# Patient Record
Sex: Male | Born: 1950 | Race: Black or African American | Hispanic: No | Smoking: Never smoker
Health system: Southern US, Community
[De-identification: ages and names within clinical notes are randomized; demographics above are authoritative.]

## PROBLEM LIST (undated history)

## (undated) DIAGNOSIS — R9431 Abnormal electrocardiogram [ECG] [EKG]: Secondary | ICD-10-CM

## (undated) DIAGNOSIS — R55 Syncope and collapse: Secondary | ICD-10-CM

## (undated) DIAGNOSIS — R0600 Dyspnea, unspecified: Secondary | ICD-10-CM

## (undated) DIAGNOSIS — M1990 Unspecified osteoarthritis, unspecified site: Secondary | ICD-10-CM

## (undated) DIAGNOSIS — Z9889 Other specified postprocedural states: Secondary | ICD-10-CM

## (undated) DIAGNOSIS — F1011 Alcohol abuse, in remission: Secondary | ICD-10-CM

## (undated) DIAGNOSIS — I10 Essential (primary) hypertension: Secondary | ICD-10-CM

## (undated) DIAGNOSIS — J449 Chronic obstructive pulmonary disease, unspecified: Secondary | ICD-10-CM

## (undated) DIAGNOSIS — F419 Anxiety disorder, unspecified: Secondary | ICD-10-CM

## (undated) DIAGNOSIS — R058 Other specified cough: Secondary | ICD-10-CM

## (undated) DIAGNOSIS — F1290 Cannabis use, unspecified, uncomplicated: Secondary | ICD-10-CM

## (undated) DIAGNOSIS — J189 Pneumonia, unspecified organism: Secondary | ICD-10-CM

## (undated) DIAGNOSIS — R05 Cough: Secondary | ICD-10-CM

## (undated) DIAGNOSIS — J45909 Unspecified asthma, uncomplicated: Secondary | ICD-10-CM

## (undated) DIAGNOSIS — R001 Bradycardia, unspecified: Secondary | ICD-10-CM

## (undated) HISTORY — PX: PEG TUBE REMOVAL: SHX2187

---

## 1970-09-14 HISTORY — PX: CIRCUMCISION: SUR203

## 2000-05-26 ENCOUNTER — Ambulatory Visit (HOSPITAL_COMMUNITY): Admission: RE | Admit: 2000-05-26 | Discharge: 2000-05-26 | Payer: Self-pay | Admitting: Gastroenterology

## 2000-05-26 ENCOUNTER — Encounter (INDEPENDENT_AMBULATORY_CARE_PROVIDER_SITE_OTHER): Payer: Self-pay | Admitting: *Deleted

## 2000-07-05 ENCOUNTER — Encounter: Payer: Self-pay | Admitting: Internal Medicine

## 2000-07-05 ENCOUNTER — Encounter: Admission: RE | Admit: 2000-07-05 | Discharge: 2000-07-05 | Payer: Self-pay | Admitting: Internal Medicine

## 2000-08-19 ENCOUNTER — Emergency Department (HOSPITAL_COMMUNITY): Admission: EM | Admit: 2000-08-19 | Discharge: 2000-08-19 | Payer: Self-pay | Admitting: Emergency Medicine

## 2000-09-02 ENCOUNTER — Emergency Department (HOSPITAL_COMMUNITY): Admission: EM | Admit: 2000-09-02 | Discharge: 2000-09-02 | Payer: Self-pay | Admitting: Emergency Medicine

## 2000-11-23 ENCOUNTER — Inpatient Hospital Stay (HOSPITAL_COMMUNITY): Admission: EM | Admit: 2000-11-23 | Discharge: 2000-11-27 | Payer: Self-pay | Admitting: *Deleted

## 2000-11-29 ENCOUNTER — Other Ambulatory Visit (HOSPITAL_COMMUNITY): Admission: RE | Admit: 2000-11-29 | Discharge: 2000-12-16 | Payer: Self-pay | Admitting: Psychiatry

## 2001-02-08 ENCOUNTER — Encounter: Admission: RE | Admit: 2001-02-08 | Discharge: 2001-02-08 | Payer: Self-pay | Admitting: Internal Medicine

## 2001-02-08 ENCOUNTER — Encounter: Payer: Self-pay | Admitting: Internal Medicine

## 2002-08-10 ENCOUNTER — Emergency Department (HOSPITAL_COMMUNITY): Admission: EM | Admit: 2002-08-10 | Discharge: 2002-08-10 | Payer: Self-pay | Admitting: Emergency Medicine

## 2002-08-10 ENCOUNTER — Encounter: Payer: Self-pay | Admitting: Emergency Medicine

## 2003-03-14 ENCOUNTER — Emergency Department (HOSPITAL_COMMUNITY): Admission: EM | Admit: 2003-03-14 | Discharge: 2003-03-14 | Payer: Self-pay | Admitting: Emergency Medicine

## 2003-03-15 ENCOUNTER — Emergency Department (HOSPITAL_COMMUNITY): Admission: EM | Admit: 2003-03-15 | Discharge: 2003-03-15 | Payer: Self-pay | Admitting: Emergency Medicine

## 2004-01-31 ENCOUNTER — Ambulatory Visit (HOSPITAL_COMMUNITY): Admission: RE | Admit: 2004-01-31 | Discharge: 2004-01-31 | Payer: Self-pay | Admitting: *Deleted

## 2004-02-13 ENCOUNTER — Ambulatory Visit (HOSPITAL_COMMUNITY): Admission: RE | Admit: 2004-02-13 | Discharge: 2004-02-13 | Payer: Self-pay | Admitting: *Deleted

## 2004-02-13 ENCOUNTER — Ambulatory Visit (HOSPITAL_BASED_OUTPATIENT_CLINIC_OR_DEPARTMENT_OTHER): Admission: RE | Admit: 2004-02-13 | Discharge: 2004-02-13 | Payer: Self-pay | Admitting: *Deleted

## 2004-02-13 HISTORY — PX: LACERATION REPAIR: SHX5168

## 2005-11-14 IMAGING — MR MR [PERSON_NAME] UP JT W/CM*R*
8 series · 16 of 16 positions shown · IV contrast (Yes   OMNISCAN)
Comparison: none

CLINICAL DATA: Wrist pain for approximately 11 months.  Question scapholunate ligament tear.  
MRI ARTHROGRAM RIGHT WRIST WITH CONTRAST
Paper copies of right wrist radiographs done at [REDACTED] on 01/08/04 are correlated.   Multiplanar imaging was performed according to the MR arthrogram protocol.  
  The MR confirms the presence of contrasted fluid within the distal radioulnar joint.  As correlated with the fluoroscopic images obtained during the injection, this is probably via a small central degenerative tear of the triangular fibrocartilage.  The ulnar variance is neutral, and no cystic change or edema is present in the distal ulna or adjacent lunate.  There is mild subchondral cystic change in the volar aspect of the triquetrum adjacent to its articulation with the pisiform.  There is quite a bit of capsular extravasation from the ulnar aspect of the wrist.  This is typically incidental, but can be seen with injury to the ulnar collateral ligament.  The extensor carpi ulnaris tendon appears normally positioned, and there is no contrasted fluid within its sheath.  
No contrasted fluid is present within the mid carpal joint.  The scapholunate and lunotriquetral ligaments appear intact.  There is no evidence of carpal bone fracture.  A small amount of fluid within the flexor tendon sheaths contains Gadolinium and is likely related to the capsular extravasation.  Minimal degenerative change is noted at the base of the first metacarpal.
IMPRESSION
1.  MRI of the right wrist demonstrates contrasted fluid in the distal radioulnar joint, likely due to a small degenerative central tear of the triangular fibrocartilage.  Alternatively, this could be due to injury of the ulnar aspect of the TFCC, and there is some capsular extravasation in the region of the ulnar collateral ligament.
2.  Intact scapholunate and lunotriquetral ligaments.  No suspicious carpal bone signal abnormalities.  No evidence of ulnolunate abutment syndrome.

[Series 1: 3 plane loc · axial · 4.0mm · 0.94mm/px · 1 of 27 slices shown]
[im 1/27]
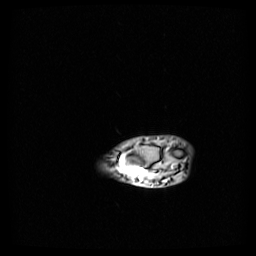

[Series 2: T1 fat-sat · oblique · 3.0mm · 0.47mm/px · 2 of 20 slices shown (1 of 3)]
[im 1/20]
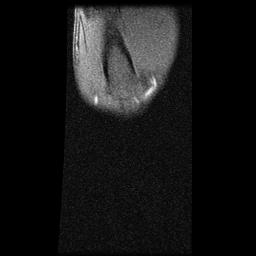
[im 20/20]
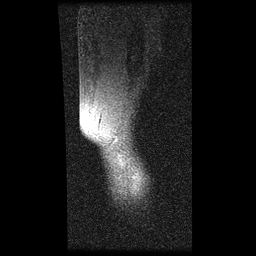

[Series 3: T1 fat-sat · coronal · 2.0mm · 0.47mm/px · 2 of 20 slices shown (2 of 3)]
[im 1/20]
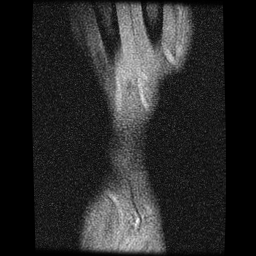
[im 20/20]
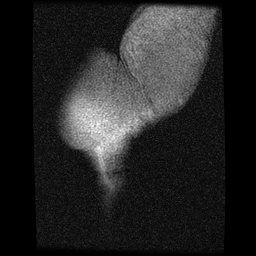

[Series 4: PD · coronal · 2.0mm · 0.47mm/px · 2 of 20 slices shown]
[im 1/20]
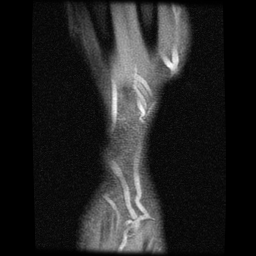
[im 20/20]
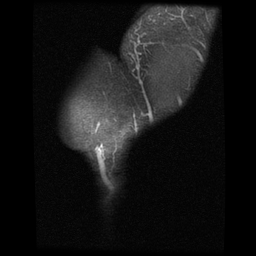

[Series 5: T2 · coronal · 2.0mm · 0.23mm/px · 2 of 20 slices shown (1 of 2)]
[im 1/20]
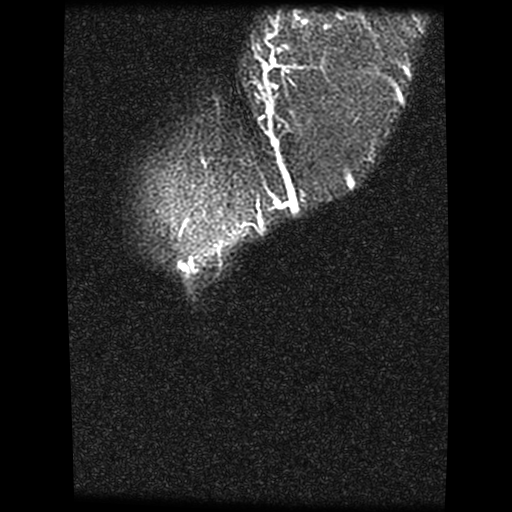
[im 20/20]
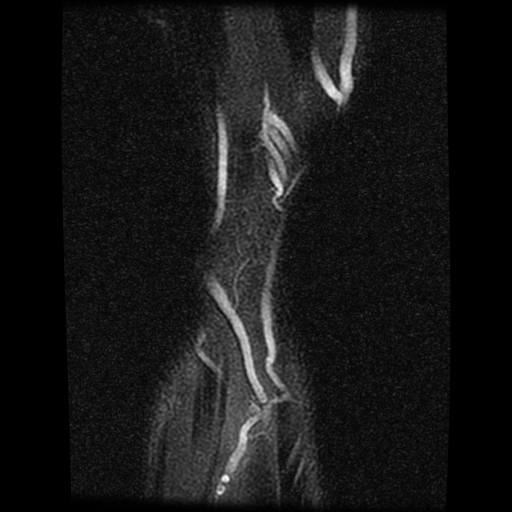

[Series 6: T1 fat-sat · oblique · 3.0mm · 0.39mm/px · 2 of 20 slices shown (3 of 3)]
[im 1/20]
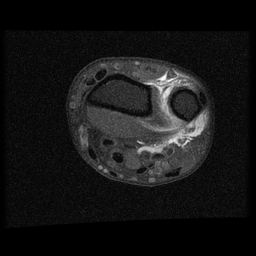
[im 20/20]
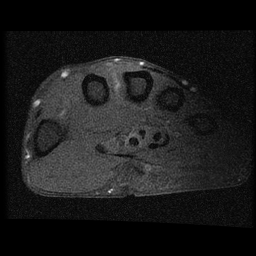

[Series 7: PD fat-sat · oblique · 3.0mm · 0.39mm/px · 3 of 40 slices shown]
[im 1/40]
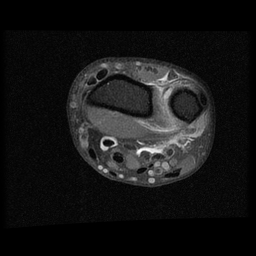
[im 20/40]
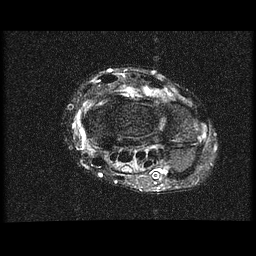
[im 40/40]
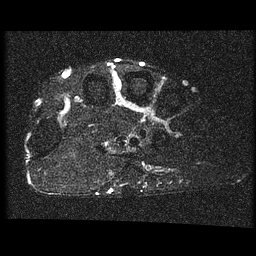

[Series 8: T2 · coronal · 2.0mm · 0.23mm/px · 2 of 19 slices shown (2 of 2)]
[im 1/19]
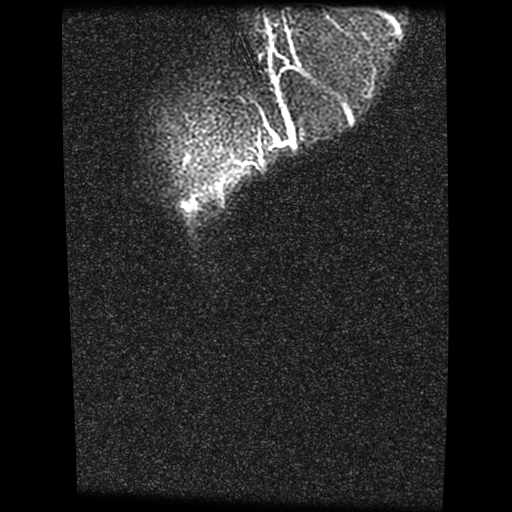
[im 19/19]
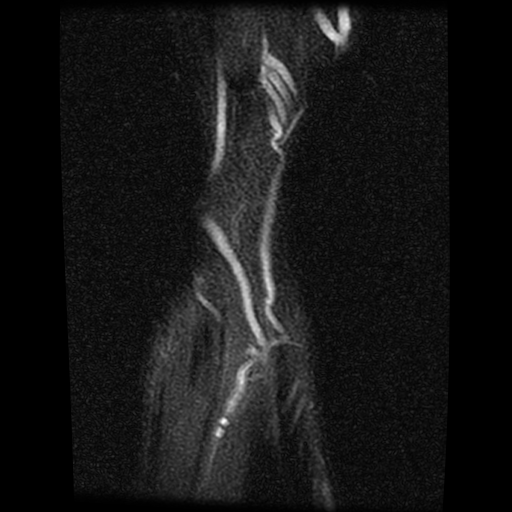

[16 of 16 positions shown; findings below may reference images not displayed]

## 2005-11-14 IMAGING — RF DG FLUORO GUIDE NDL PLC/BX
5 series · 5 of 5 positions shown · IV contrast (omniscan)
Comparison: none

CLINICAL DATA: Right wrist pain. 
RIGHT WRIST CONTRAST INJECTION UNDER FLUOROSCOPIC GUIDANCE 
Signed informed consent was obtained from the patient for the wrist arthrogram procedure. The dorsal aspect of the wrist was sterilely prepped and draped. Under fluoroscopic guidance, a 25-gauge needle was advanced into the joint space at the level of the mid scaphoid waist.   60 percent Hypaque contrast containing 1 to 1,000 dilution of Omniscan was injected into the radiocarpal joint space successfully.  Approximately 4 cc was injected, and contrast is seen filling the distal radial ulnar joint, consistent with a tear in the triangular fibrocartilage.  
The needle was subsequently removed and a sterile bandage was applied to the skin puncture site.  The patient tolerated the procedure without difficulty and there was no evidence of immediate complication.
MRI arthrogram results will be reported separately.
IMPRESSION
Successful contrast injection into the radiocarpal joint space of the right wrist under fluoroscopic guidance.  MRI arthrogram results will be reported separately.

[Series 1: run · 1 of 1 slices shown (1 of 5)]
[im 1/1]
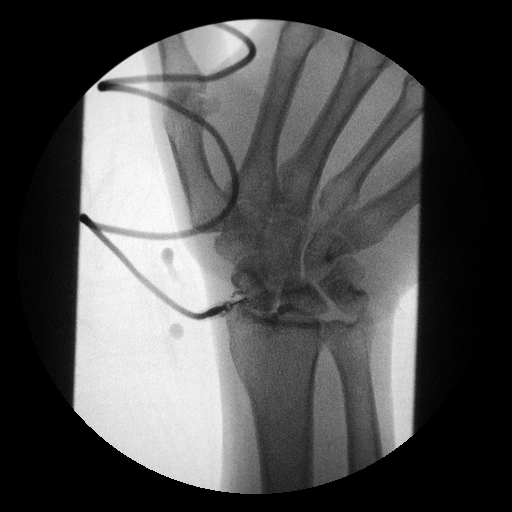

[Series 2: run · 1 of 1 slices shown (2 of 5)]
[im 1/1]
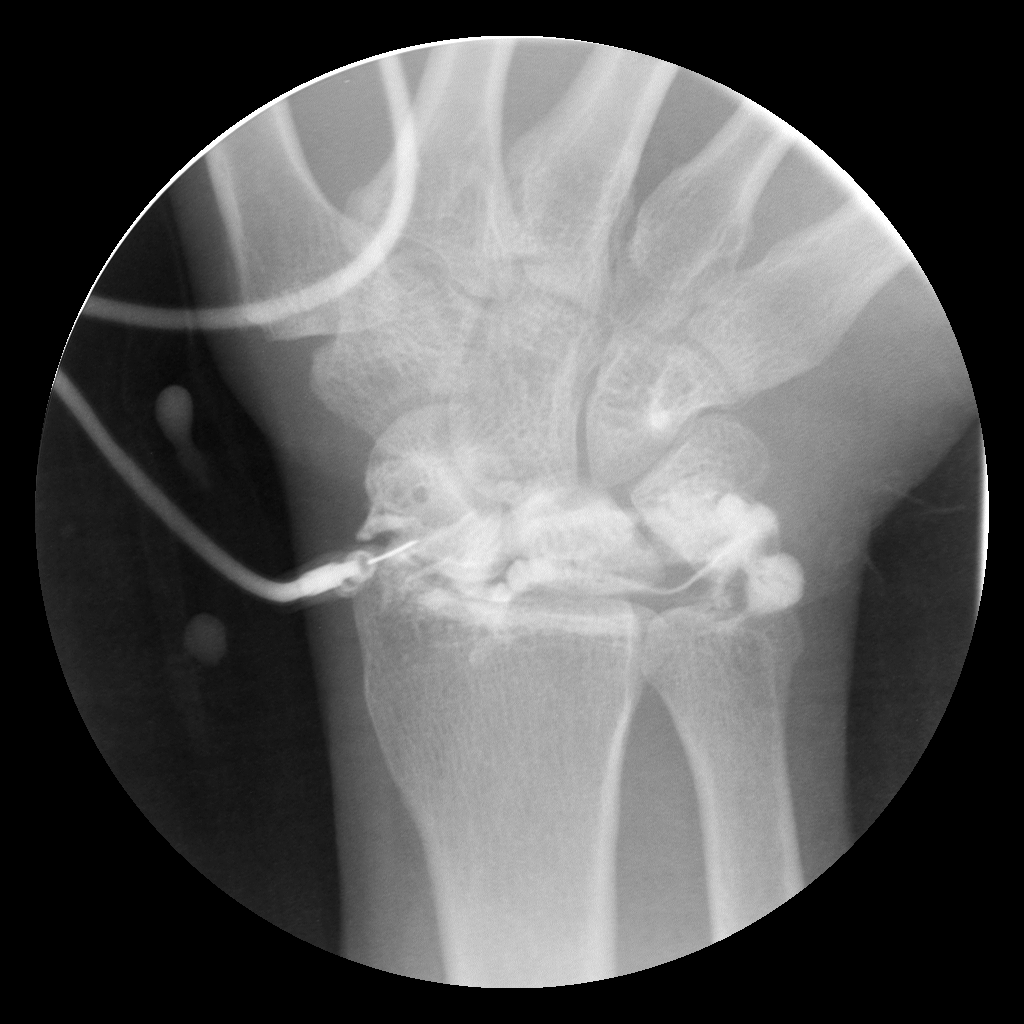

[Series 3: run · 1 of 1 slices shown (3 of 5)]
[im 1/1]
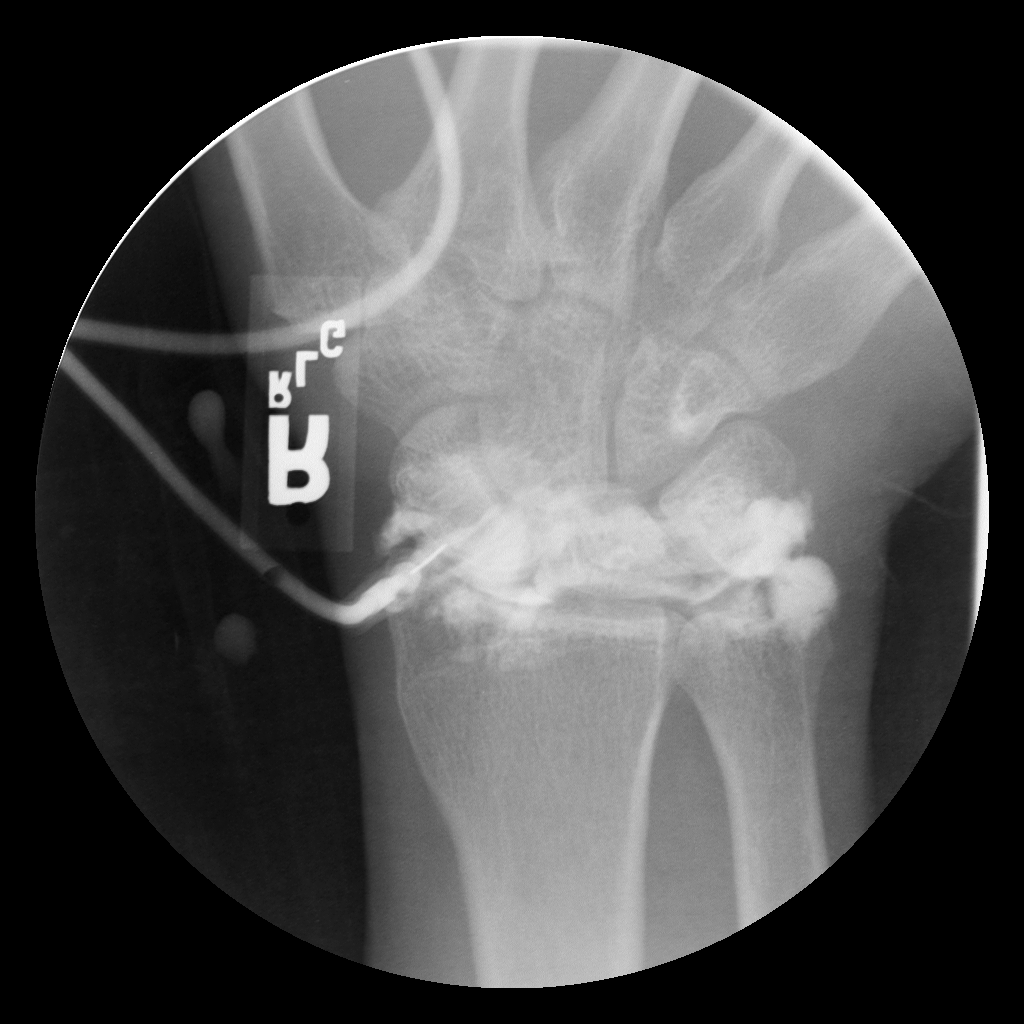

[Series 4: run · 1 of 1 slices shown (4 of 5)]
[im 1/1]
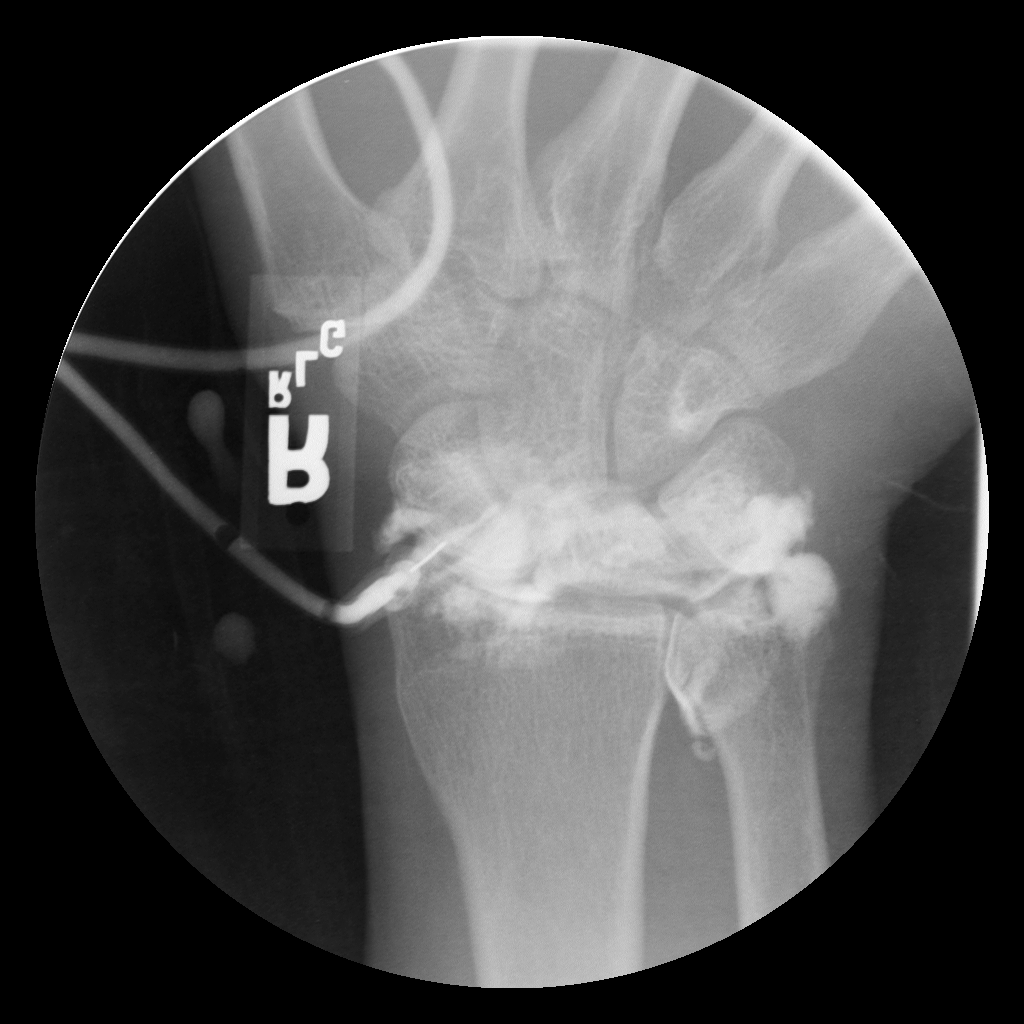

[Series 5: run · 1 of 1 slices shown (5 of 5)]
[im 1/1]
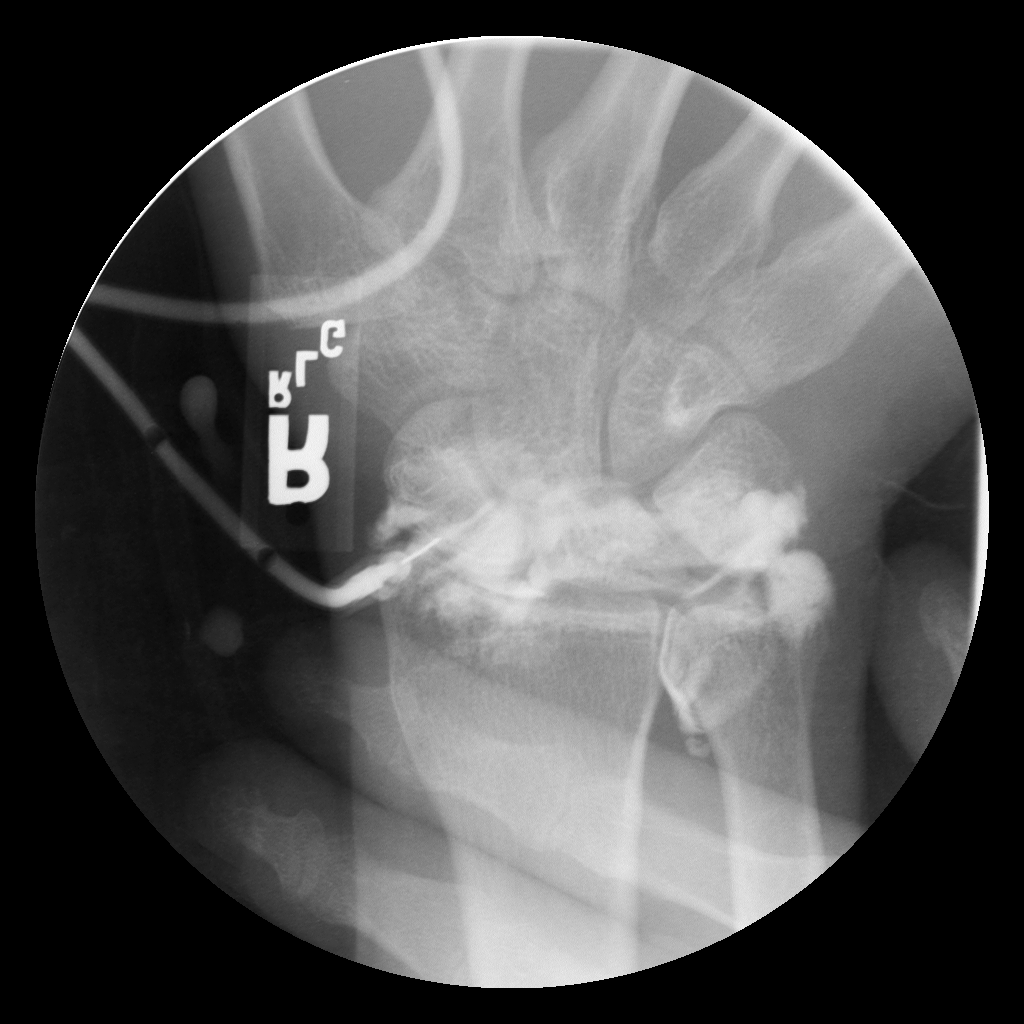

[5 of 5 positions shown; findings below may reference images not displayed]

## 2008-02-23 ENCOUNTER — Emergency Department (HOSPITAL_COMMUNITY): Admission: EM | Admit: 2008-02-23 | Discharge: 2008-02-23 | Payer: Self-pay | Admitting: Emergency Medicine

## 2008-06-11 ENCOUNTER — Emergency Department (HOSPITAL_COMMUNITY): Admission: EM | Admit: 2008-06-11 | Discharge: 2008-06-11 | Payer: Self-pay | Admitting: Emergency Medicine

## 2008-06-26 ENCOUNTER — Emergency Department (HOSPITAL_COMMUNITY): Admission: EM | Admit: 2008-06-26 | Discharge: 2008-06-27 | Payer: Self-pay | Admitting: Emergency Medicine

## 2009-04-07 ENCOUNTER — Emergency Department (HOSPITAL_COMMUNITY): Admission: EM | Admit: 2009-04-07 | Discharge: 2009-04-07 | Payer: Self-pay | Admitting: Emergency Medicine

## 2010-04-10 IMAGING — CR DG HUMERUS 2V *R*
2 series · 2 of 2 positions shown · non-contrast
Comparison: None

CLINICAL DATA: Arm pain after pushing heavy object.

RIGHT HUMERUS - 2+ VIEW

[w humerus ap right *]
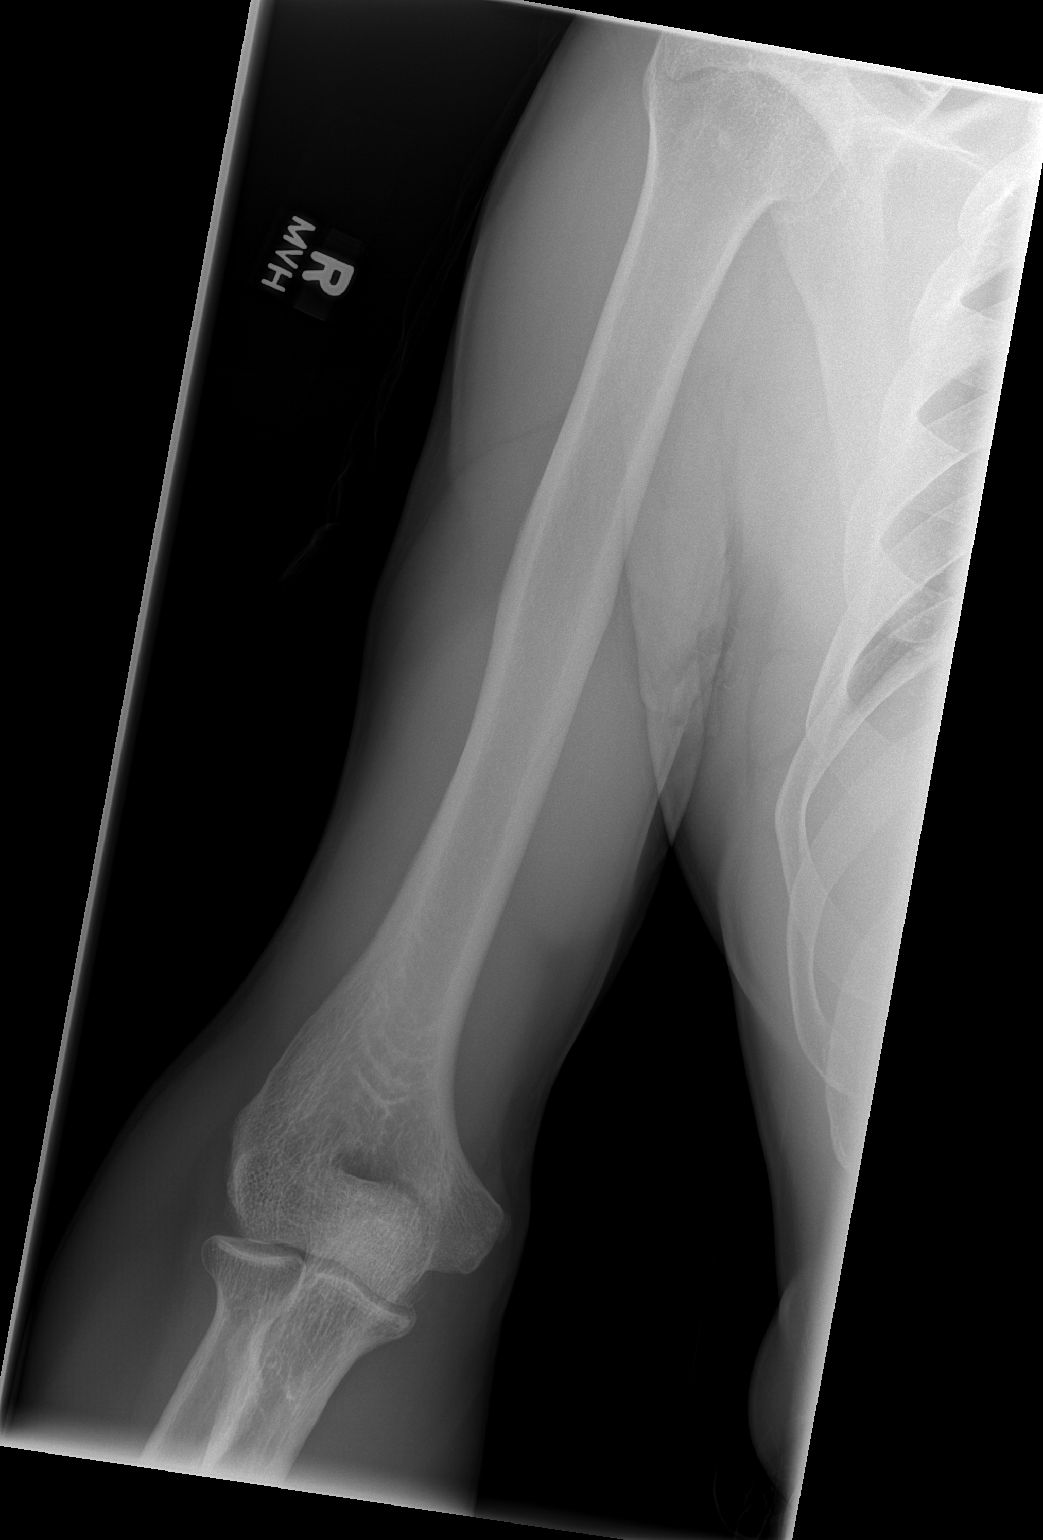

[w humerus lat right *]
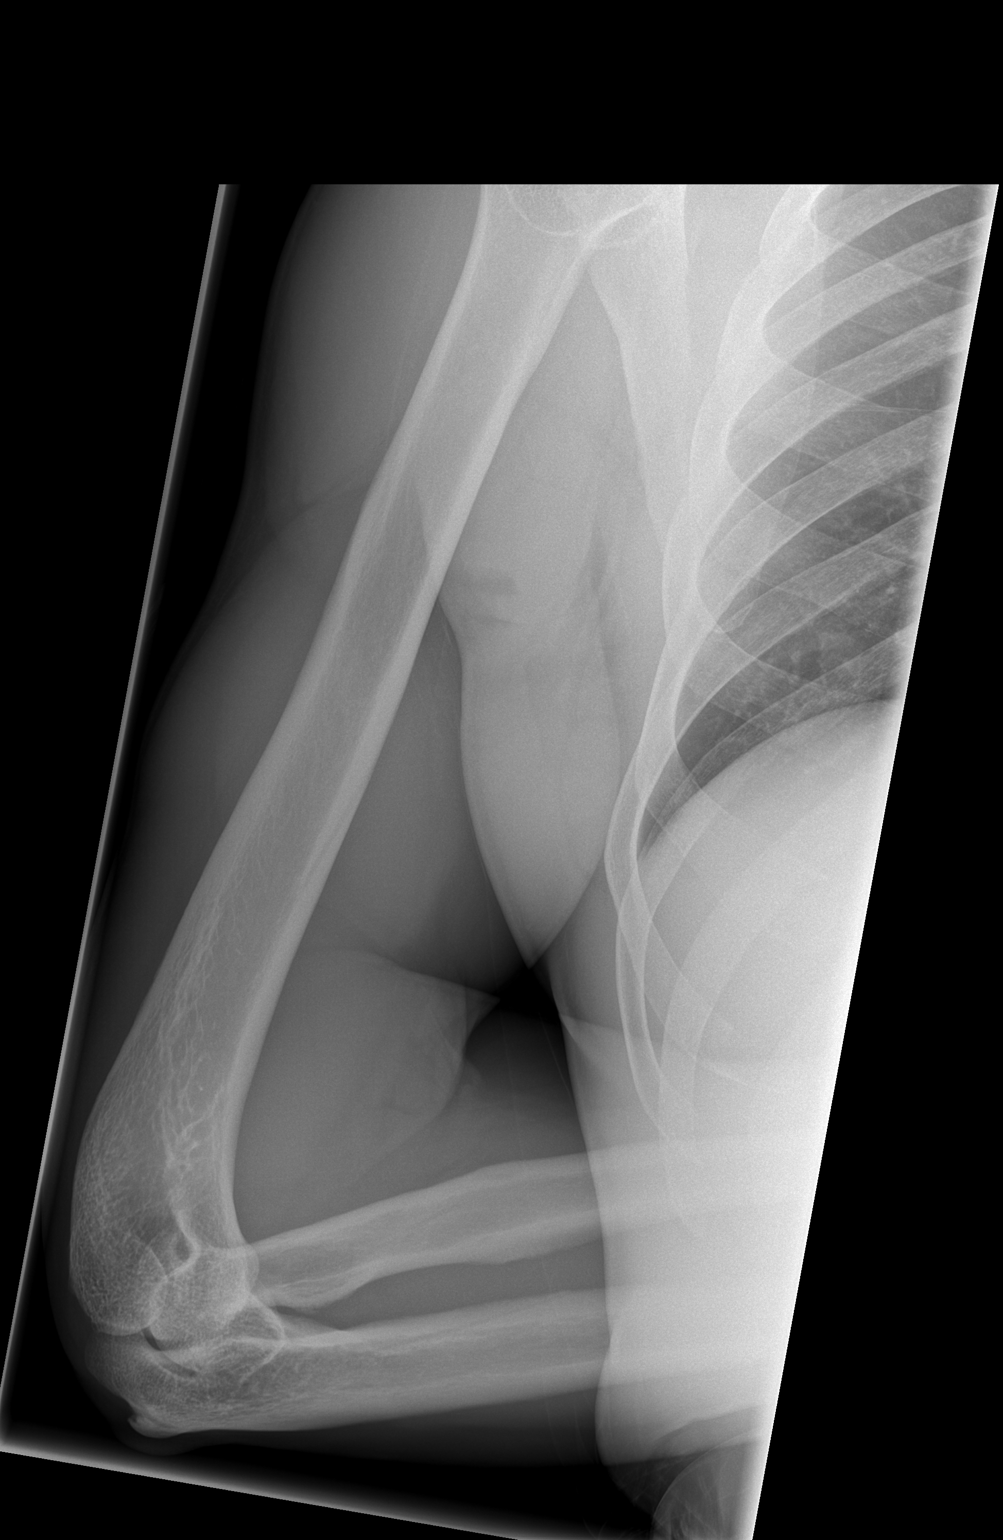

[2 of 2 positions shown; findings below may reference images not displayed]

FINDINGS: No humeral fracture is identified.  No acute bony
abnormality of the humerus is noted.
IMPRESSION: 1.  No acute bony abnormality of the humerus is identified.

## 2010-04-15 ENCOUNTER — Emergency Department (HOSPITAL_COMMUNITY): Admission: EM | Admit: 2010-04-15 | Discharge: 2010-04-15 | Payer: Self-pay | Admitting: Family Medicine

## 2011-01-20 IMAGING — CR DG ANKLE COMPLETE 3+V*L*
3 series · 3 of 3 positions shown · non-contrast
Comparison: None.

CLINICAL DATA: Trauma, pain

LEFT ANKLE COMPLETE - 3+ VIEW

[t ankle joint ap left]
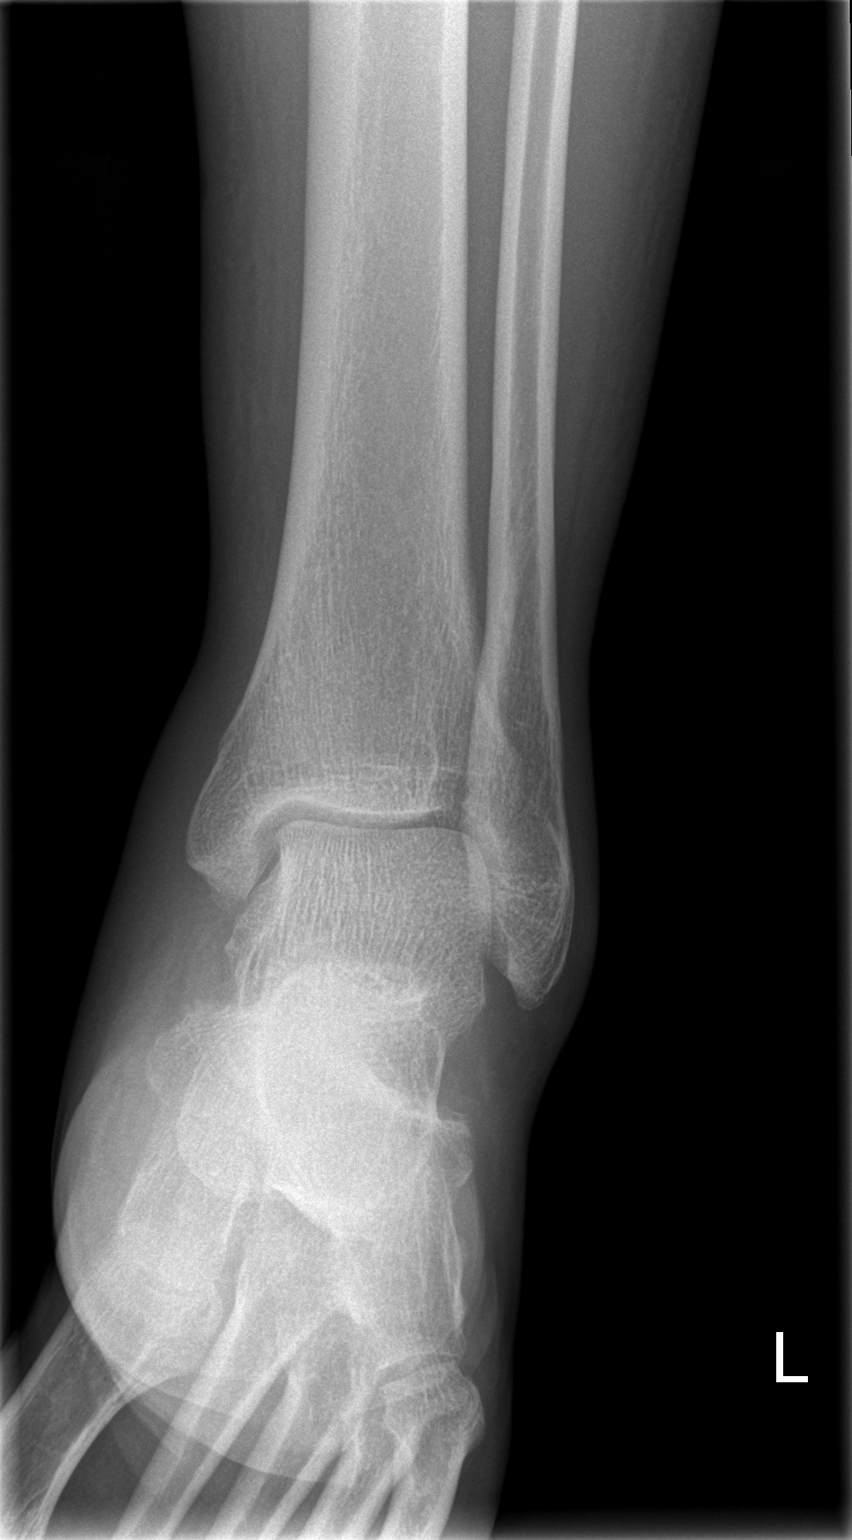

[t ankle joint oblique left]
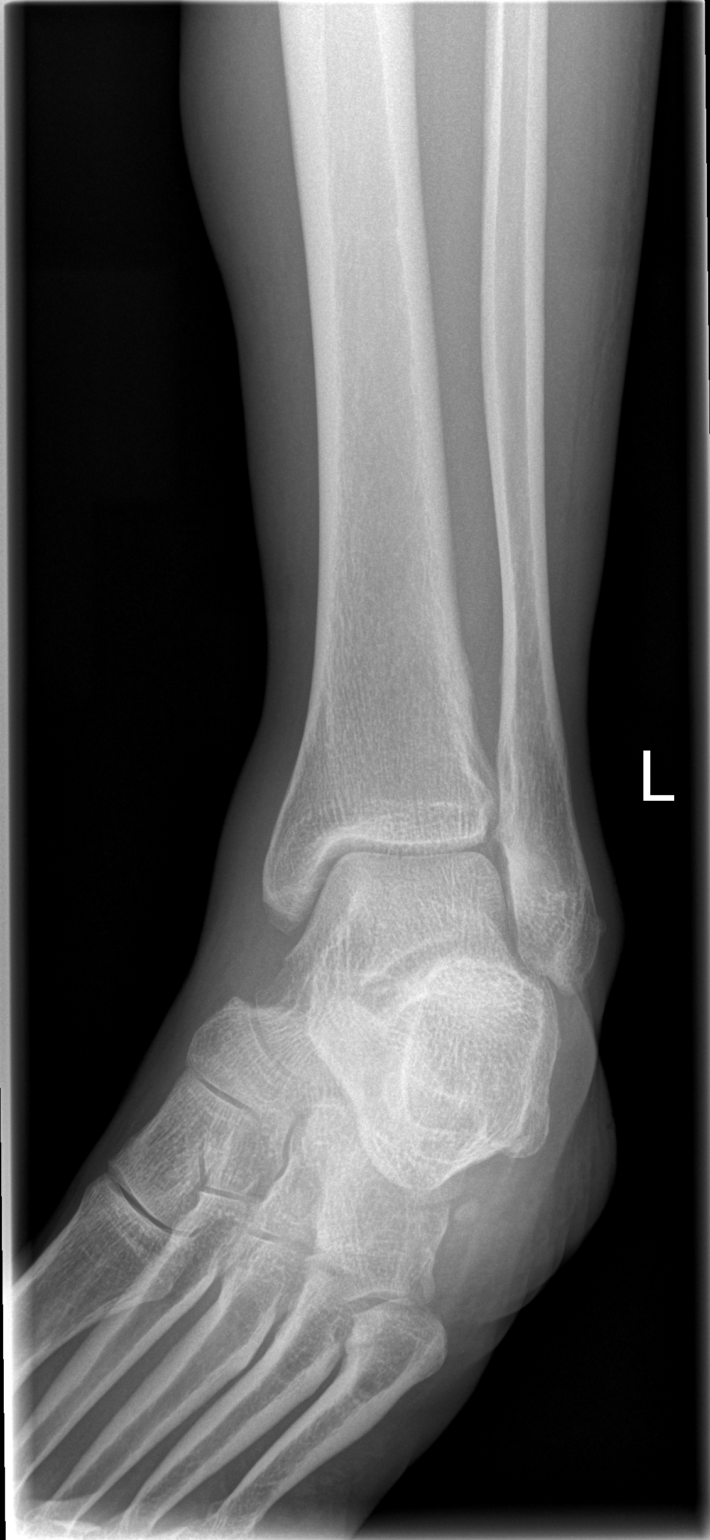

[t ankle joint lat left]
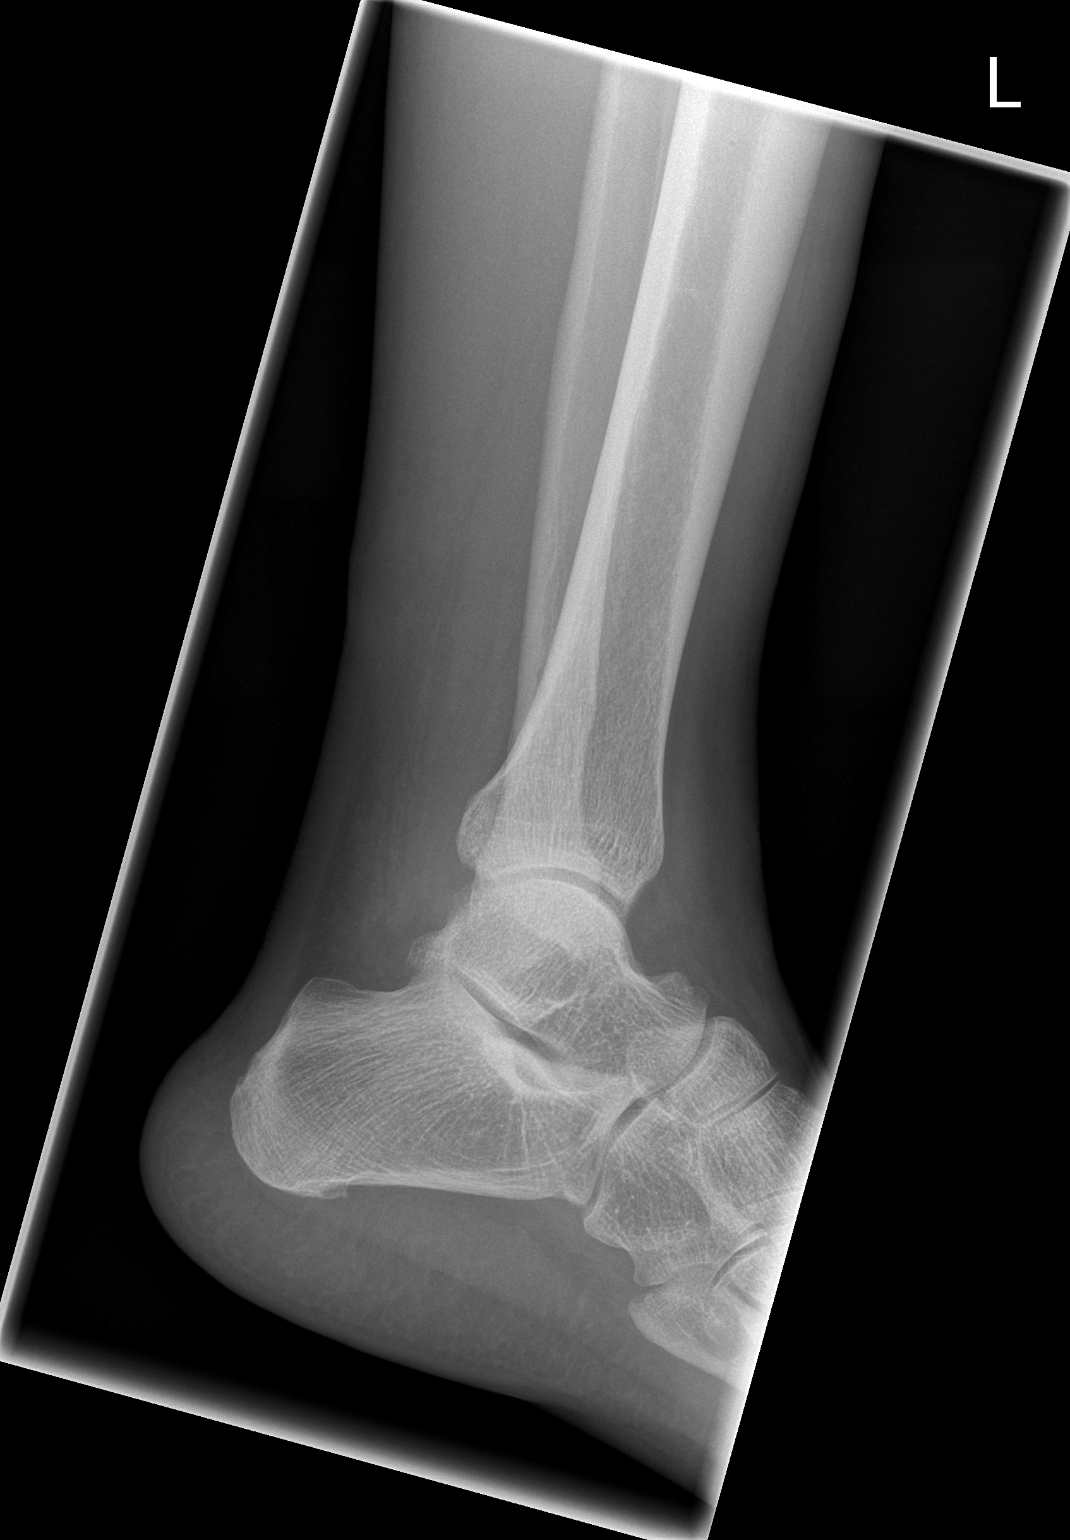

[3 of 3 positions shown; findings below may reference images not displayed]

FINDINGS: Diffuse left ankle soft tissue swelling.  Normal
alignment.  No displaced fracture.  Intact malleoli, talus and
calcaneus.
IMPRESSION: Soft tissue swelling without acute bony abnormality

## 2011-01-20 IMAGING — CR DG TIBIA/FIBULA 2V*L*
2 series · 2 of 2 positions shown · non-contrast
Comparison: 04/07/2009

CLINICAL DATA: Trauma, pain

LEFT TIBIA AND FIBULA - 2 VIEW

[t tib/fib lat left]
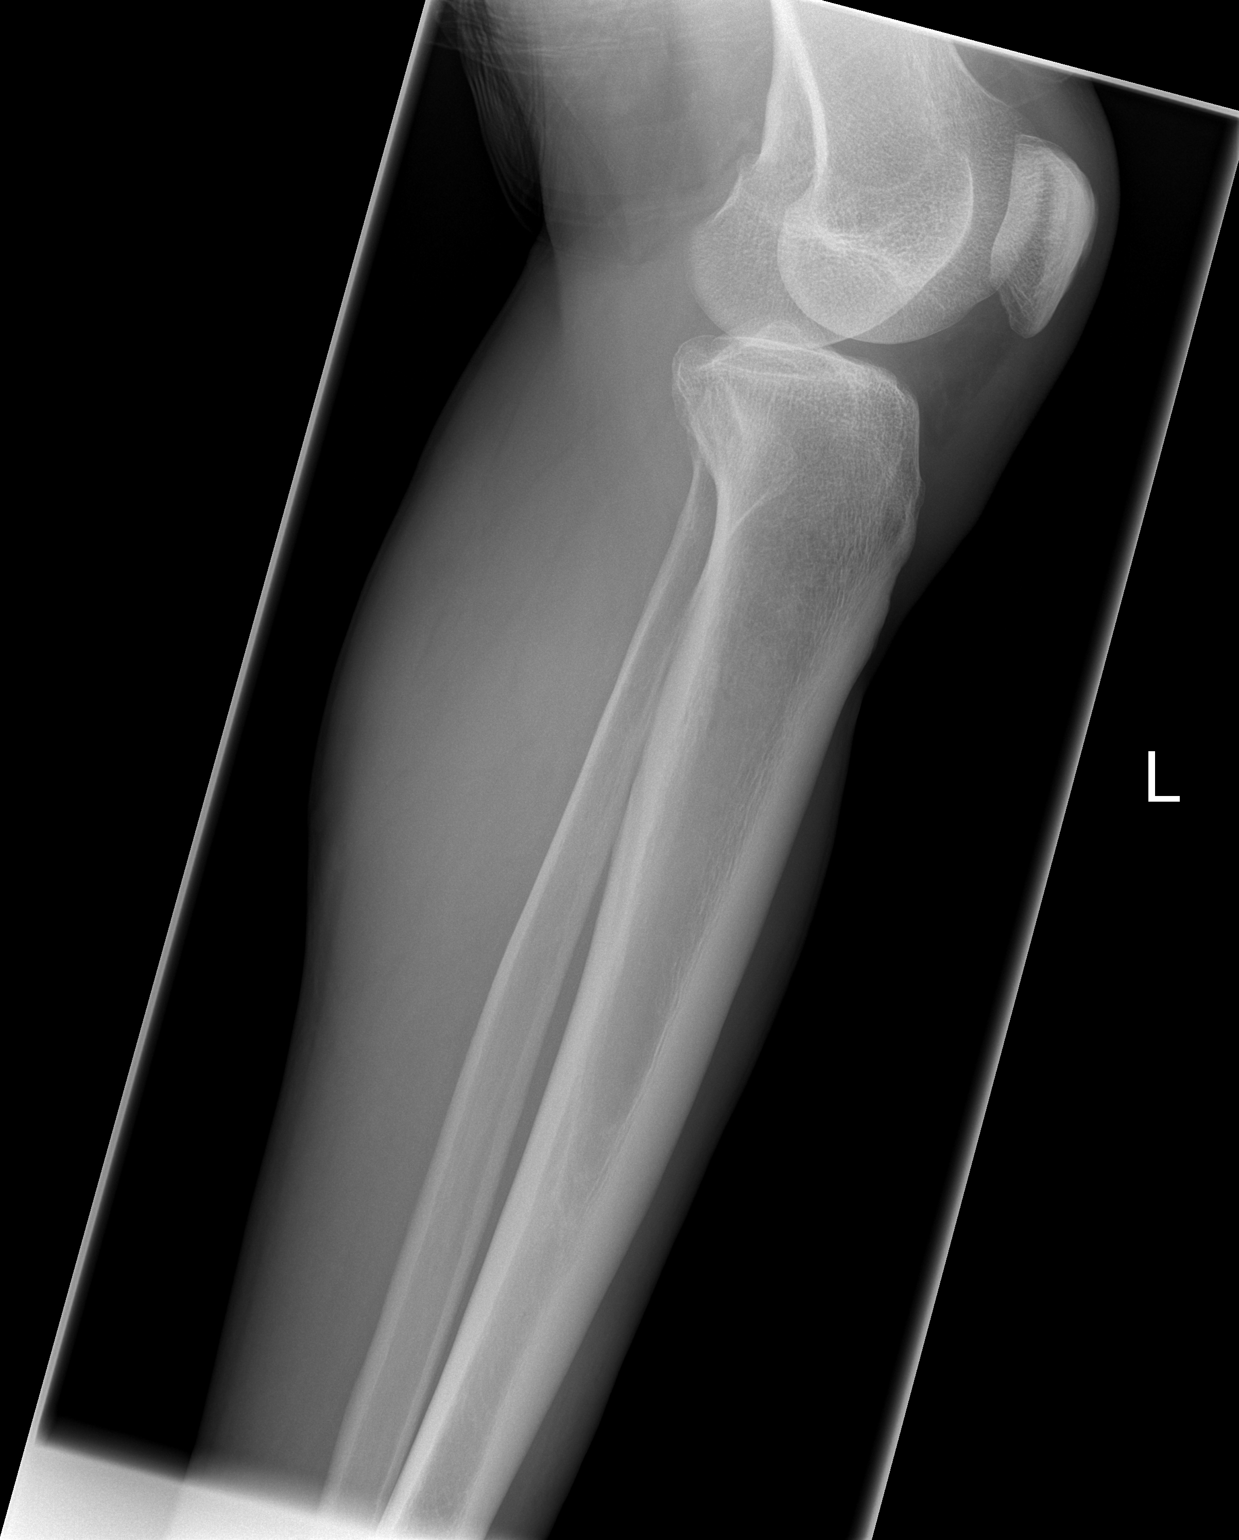

[t tib/fib ap left]
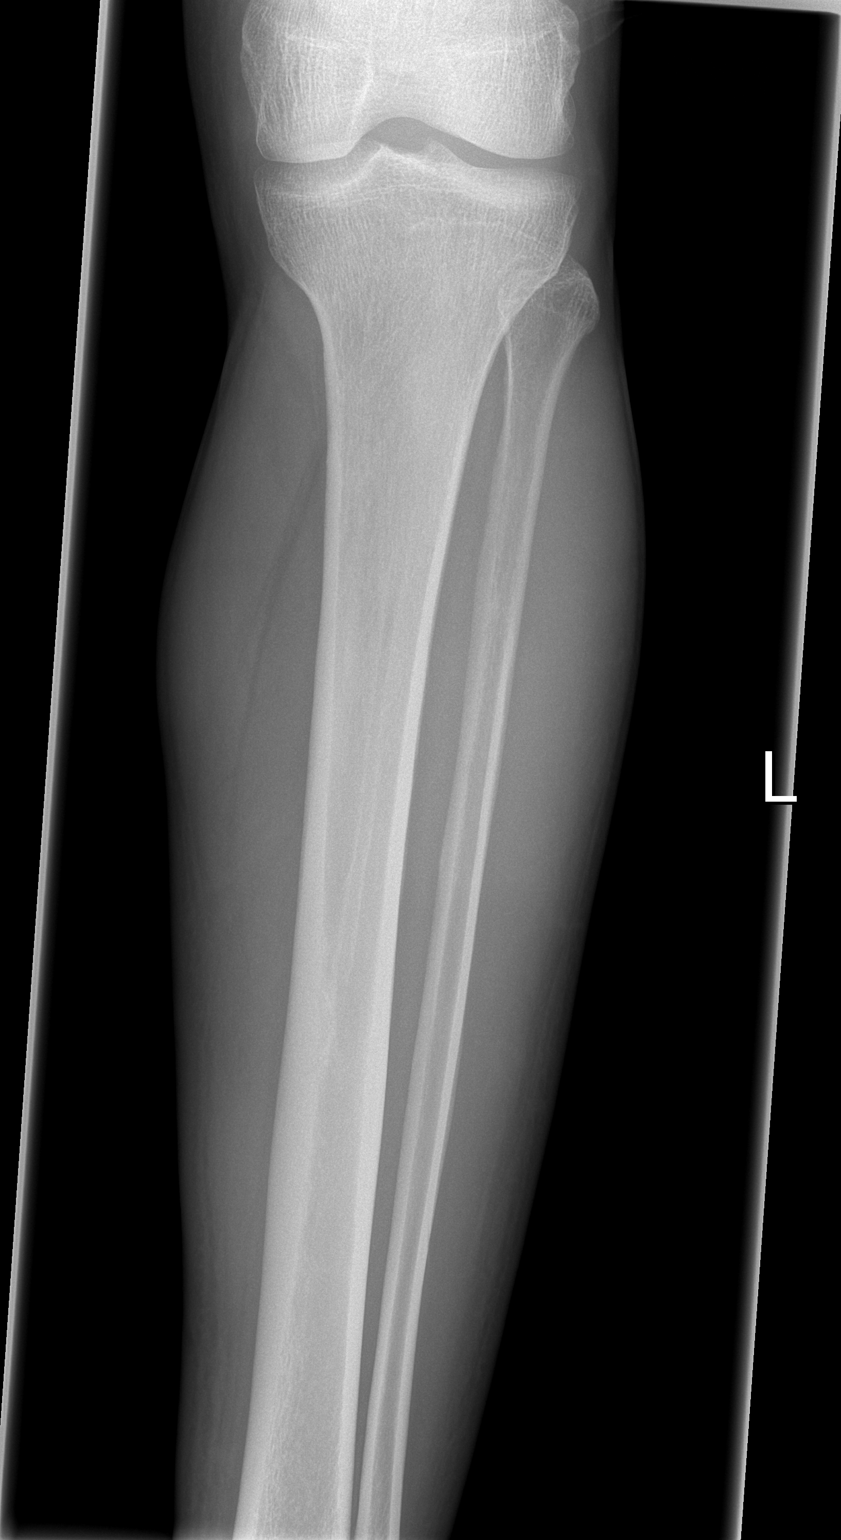

[2 of 2 positions shown; findings below may reference images not displayed]

FINDINGS: Intact left tibia and fibula.  No fracture.  Normal
alignment.
IMPRESSION: No acute bony abnormality

## 2011-01-30 NOTE — Op Note (Signed)
NAMESENDER, RUEB                          ACCOUNT NO.:  [id number]   MEDICAL RECORD NO.:  [id number]                   PATIENT TYPE:  AMB   LOCATION:  [facility]                                  FACILITY:  [facility]   PHYSICIAN:  Lowell Bouton, M.D.      DATE OF BIRTH:  1951/07/04   DATE OF PROCEDURE:  02/13/2004  DATE OF DISCHARGE:                                 OPERATIVE REPORT   PREOPERATIVE DIAGNOSIS:  Triangular fibrocartilage tear, right wrist.   POSTOPERATIVE DIAGNOSIS:  Triangular fibrocartilage tear, right wrist.   PROCEDURE:  Arthroscopic debridement of triangular fibrocartilage tear,  right wrist.   SURGEON:  Lowell Bouton, M.D.   ANESTHESIA:  General.   OPERATIVE FINDINGS:  The patient had a very small central tear that had the  distal ulna showing through it but without any irregularity on the end of  the bone.  He also had a small peripheral tear dorsal ulnarly.  Both of the  tears were debrided.  The scapholunate ligament appeared to be intact.  The  mid carpal joint did not show significant signs of arthrosis and the  radioscaphoid joint was also smooth.   PROCEDURE:  Under general anesthesia with a tourniquet on the right arm, the  right hand was prepped and draped in the usual fashion and the index and  middle fingers were suspended by finger traps from the arthroscopy tower.  The upper arm was secured to the arm board with a strap and the arthroscopy  tower was used to apply 10 pounds of traction across the wrist.  The radial  carpal joint was inflated with 5 mL of saline and an outflow was established  at the 6U portal.  An 11 blade was used to make a small stab wound at the  3/4 portal just distal to the EPL tendon.  Blunt dissection was carried down  to the capsule and the blunt trocar was used with the cannula to insert the  cannula into the radial carpal joint.  The arthroscope was then inserted and  the in flow hooked up to the scope.   There was good outflow through the 6U  portal.  Arthroscopic evaluation revealed relatively smooth radial carpal  surfaces.  There was some dorsal synovitis.  Ulnarly, the TFC was examined  and there was a small central tear that I could see the distal ulna through  but it measured only about 2 mm.  Going further ulnarly, there was a  peripheral tear at the ulnar dorsal aspect of the TFC.  There was a fair  amount of dorsal synovitis around this.  An 11 blade was then used to  establish a 4/5 portal into the ulnar carpal joint.  This was performed with  a blunt trocar and then a nerve hook was inserted.  This allowed examination  of the tears.  The nerve hook was then removed and a shaver was inserted  using a Kuda blade.  Suction outflow was turned off and the suction went  through the shaver.  The TFC tears were debrided with the shaver.  This  allowed for removal of any flaps or irregularities and also debridement of  the dorsal capsular inflammation.  The arthroscope was then removed and the  shaver was removed and the arthroscope was then placed in the 4/5 portal to  further examine the ulnar side of the joint.  There was good signs of  debridement.  The scope was then brought radially and the scapholunate  appeared to be intact without any disruption of the ligament.  There was  some irregularity there but no tear that I could identify.  The arthroscope  was then removed and placed back in the 3/4 portal hole and distally placed  into the mid carpal joint.  The scaphocapitate joint appeared to be smooth  and the scapholunate ligament and triquetrolunate ligaments were identified  and were intact.  There was no widening between the bones at either side.  No outflow was established at the mid carpal joint.  A terse examination was  done of the mid carpal joint as no pathology had been identified there.  The  scope was then  removed and 0.5% Marcaine was inserted for pain control.  The  wounds were  closed with 4-0 nylon sutures.  Sterile dressings were applied followed by a  volar wrist splint.  The patient tolerated the procedure well and went to  the recovery room awake, stable, in good condition.                                               Lowell Bouton, M.D.    EMM/MEDQ  D:  02/13/2004  T:  02/13/2004  Job:  [id number]

## 2011-01-30 NOTE — H&P (Signed)
[facility]  Patient:    [name] [name], [name] [name]                     MRN: [id number] Adm. Date:  [id number] Attending:  Jasmine Pang                   Psychiatric Admission Assessment  DATE OF ADMISSION:  November 23, 2000  PATIENT IDENTIFICATION:  This is a 60 year old African-American male from [place], [place].  HISTORY OF PRESENT ILLNESS:  The patient presented to our assessment office due to his alcohol dependence.  He is requesting detoxification but states when he attempted to stop drinking on his own he has had withdrawal symptoms. He has been drinking at least six 24 ounce beers daily since the age of 39. His last attempt to stop drinking was two months ago but he had significant anxiety, tremor, headache, and stomach upset.  He restarted drinking and these symptoms went away.  PAST PSYCHIATRIC HISTORY:  None.  SUBSTANCE ABUSE HISTORY:  As per history of present illness.  The patient denies other drug use.  PAST MEDICAL HISTORY:  Primary care physician is Jetty Peeks, M.D.  Medical problems: Hypertension, headaches.  Medications: Norvasc 5 mg daily, amitriptyline 50 mg at h.s.  Drug allergies: No known drug allergies.  SOCIAL HISTORY:  Family psychiatric history: Denies history of family psychiatric illness except for mother suffering from alcoholism.  Social history: The patient lives with his girlfriend.  He works for the [employer].  His mother had a history of alcoholism and he grew up in various foster homes after being removed from her care for neglect.  Legal history: None recently.  He has had a history of DWIs, the last one four to five years ago.  MENTAL STATUS EXAMINATION:  The patient presented as a reserved but friendly African-American male, casually dressed with intermittent eye contact. Speech: Normal rate and flow, nonspontaneous.  Psychomotor retardation.  Mood was anxious regarding his detoxification.   Affect: Able to reveal wide range and smile at appropriate times.  No suicidal or homicidal ideation, no psychosis or perceptual disturbance.  Thought processes were logical and goal directed.  Thought content revealed no predominant theme.  Cognitive: Alert and oriented x 4.  Short-term and long-term memory were adequate.  General fund of knowledge: Age and education level appropriate.  Attention and concentration: Diminished.  Insight: Fair.  Judgment: Good.  ADMISSION DIAGNOSES: Axis I:    1. Substance-induced anxiety disorder.            2. Alcohol dependence. Axis II:   Deferred. Axis III:  1. Hypertension.            2. Allergies. Axis IV:   Moderate. Axis V:    Global assessment of functioning current 30, highest past year 60.  ASSETS AND STRENGTHS:  The patient has a job.  He has a help-seeking attitude. He is in good health.  Problems: Substance dependence with inability to detoxification on own without symptoms.  INITIAL PLAN OF CARE:  Short-term treatment goal: Phenobarbital detoxification protocol.  Long-term treatment goal: Commitment to an active recovery program to maintain sobriety.  ESTIMATED LENGTH OF STAY:  Three to five days.  CONDITIONS NECESSARY FOR DISCHARGE:  Detoxified from alcohol.  POST HOSPITAL CARE PLAN:  Follow-up substance abuse treatment will be arranged by his case manager prior to discharge either at [facility] or [facility]. DD:  11/24/00 TD:  11/24/00 Job: [id number]

## 2011-01-30 NOTE — Discharge Summary (Signed)
[facility]  Patient:    [name], [name]                     MRN: [id number] Adm. Date:  [id number] Disc. Date: 11/27/00 Attending:  Jasmine [name] CC:         [facility] IOP Program at [facility].   Discharge Summary  REASON FOR ADMISSION:  Patient was a 60 year old African-American male from [place], [place].  He presented to our assessment office due to his alcohol dependence.  He was requesting detoxification and states that when attempting to stop drinking on his own he has severe withdrawal symptoms.  He had been drinking at least 6 24-ounce beers daily since the age of 42.  His last attempt to stop drinking was 2 months ago, but he had significant anxiety, tremor, headache and stomach upset.  He restarted drinking and these symptoms went away.  He has had no previous psychiatric treatment.  He denies use of other drugs.  For further admission information, see psychiatric admission assessment.  PHYSICAL EXAMINATION:  No acute medical problems.  It was noted that he was in need of dental care.  ADMISSION LABORATORIES:  A comprehensive metabolic panel was within normal limits.  CBC was grossly within normal limits except for a slightly decreased WBC count of 3.7 (4-10.5).  Free T4 and TSH was within normal limits.  HOSPITAL COURSE:  Upon admission, patient was placed on the phenobarbital detoxification protocol.  He tolerated this well and did not have withdrawal symptoms.  He was also continued on his current medications of Lackinol-E cream to his feet b.i.d., amitriptyline 50 mg p.o. q.h.s., and Norvasc 5 mg p.o. q.d. for hypertension.  In addition he was given 800 mg of ibuprofen  as a one time dose due to severe headache probably related to the initial stages of withdrawal.  Our PA, [name] [name], placed him on Midrin 1-2 q.4h. for headache when not relieved by Tylenol, with a maximum of 8 per day.  His headaches were  well controlled and resolved within 2 days.  He was able to participate appropriately in unit therapeutic groups and activities.  He was committed to an active recovery program.  He agreed to attend the chemical dependence IOP program after leaving here.  His girlfriend visited frequently, and he states this went well.  He was planning to go home to live with her. There was no evidence of other psychiatric disorder including depression or anxiety that needed to be treated, so he was not placed on psychiatric medication.  DISCHARGE MENTAL STATUS EXAMINATION:  His mood was improved.  He was less anxious.  There was no suicidal or homicidal ideation.  Eye contact had improved  He was less reserved and more spontaneous.  Psychomotor retardation had resolved.  There was no psychosis or perceptual disturbance.  Thought processes were logical and goal directed.  Cognitive was back to baseline. Insight fair, judgment good.  DISCHARGE DIAGNOSIS: Axis I:     1. Substance-induced anxiety disorder.             2. Alcohol dependence. Axis II:    None. Axis III:   Hypertension, allergies, ?fungal infection bilateral feet. Axis IV:    Moderate. Axis V:     Current global assessment of function is 50, highest past year is             60, admission global assessment of function was 30.  DISCHARGE MEDICATIONS:  No new medications were begun.  He was discharged on his home medications.  These included: 1. Lackinol-E cream to his feet twice daily. 2. Amitriptyline 50 mg q.h.s. 3. Norvasc 5 mg daily.  ACTIVITY LEVEL:  No restrictions.  DIET:  No restrictions.  POST HOSPITAL CARE PLAN:  Patient will begin the CDIOP on Monday, November 29, 2000 in the evenings. DD:  11/27/00 TD:  11/27/00 Job: [id number]

## 2011-01-30 NOTE — Procedures (Signed)
[place]  Patient:    [name] [name], [name] [name]                     MRN: [id number] Proc. Date: 05/26/00 Adm. Date:  [id number] Disc. Date: [id number] Attending:  Charna Elizabeth CC:         Dr. Dorothyann Peng   Procedure Report  DATE OF BIRTH:  May 29, 1989  REFERRING PHYSICIAN:  Dorothyann Peng, M.D.  PROCEDURE PERFORMED:  Colonoscopy with biopsies.  ENDOSCOPIST:  Anselmo Rod, M.D.  INSTRUMENT USED:  Olympus video colonoscope.  INDICATIONS FOR PROCEDURE:  The patient is a 60 year old black male with a history of rectal bleeding, rule out colonic polyps, masses, hemorrhoids, etc.  PREPROCEDURE PREPARATION:  Informed consent was procured from the patient. The patient was fasted for eight hours prior to the procedure and prepped with a bottle of magnesium citrate and a gallon of NuLytely the night prior to the procedure.  PREPROCEDURE PHYSICAL:  The patient had stable vital signs.  Neck supple. Chest clear to auscultation.  S1, S2 regular.  Abdomen soft with normal abdominal bowel sounds.  DESCRIPTION OF PROCEDURE:  The patient was placed in the left lateral decubitus position and sedated with 50 mg of Demerol and 5 mg of Versed intravenously.  Once the patient was adequately sedated and maintained on low-flow oxygen and continuous cardiac monitoring, the Olympus video colonoscope was advanced from the rectum to the cecum.  Small nonbleeding internal hemorrhoids were seen.  There was patchy erythema in the left colon of unclear significance, question nonsteroidal induced.  Few small scattered diverticula were seen throughout the colon and biopsies were done from the patchy areas of erythema for pathology.  IMPRESSION: 1. Small nonbleeding internal hemorrhoid. 2. Patchy erythema in the left colon, biopsies done. 3. Few scattered diverticula.  RECOMMENDATIONS: 1. Await pathology results. 2. Avoid nonsteroidals. 3. Outpatient follow-up  in the next two weeks.DD:  05/26/00 TD:  05/28/00 Job: [id number]

## 2011-02-06 ENCOUNTER — Emergency Department (HOSPITAL_COMMUNITY)
Admission: EM | Admit: 2011-02-06 | Discharge: 2011-02-06 | Disposition: A | Discharge status: Home / Self Care | Payer: Self-pay | Attending: Emergency Medicine | Admitting: Emergency Medicine

## 2011-02-06 ENCOUNTER — Emergency Department (HOSPITAL_COMMUNITY): Payer: Self-pay

## 2011-02-06 DIAGNOSIS — H53149 Visual discomfort, unspecified: Secondary | ICD-10-CM | POA: Insufficient documentation

## 2011-02-06 DIAGNOSIS — H5710 Ocular pain, unspecified eye: Secondary | ICD-10-CM | POA: Insufficient documentation

## 2011-02-06 DIAGNOSIS — R110 Nausea: Secondary | ICD-10-CM | POA: Insufficient documentation

## 2011-02-06 DIAGNOSIS — I10 Essential (primary) hypertension: Secondary | ICD-10-CM | POA: Insufficient documentation

## 2011-02-06 DIAGNOSIS — R51 Headache: Secondary | ICD-10-CM | POA: Insufficient documentation

## 2011-06-11 LAB — B. BURGDORFI ANTIBODIES: B burgdorferi Ab IgG+IgM: 0.13

## 2011-06-11 LAB — ROCKY MTN SPOTTED FVR AB, IGM-BLOOD: RMSF IgM: 0.15 IV

## 2011-06-11 LAB — ROCKY MTN SPOTTED FVR AB, IGG-BLOOD: RMSF IgG: <1:64 {titer}

## 2011-06-30 ENCOUNTER — Emergency Department (HOSPITAL_COMMUNITY): Payer: Self-pay

## 2011-06-30 ENCOUNTER — Emergency Department (HOSPITAL_COMMUNITY)
Admission: EM | Admit: 2011-06-30 | Discharge: 2011-06-30 | Disposition: A | Discharge status: Home / Self Care | Payer: Self-pay | Attending: Emergency Medicine | Admitting: Emergency Medicine

## 2011-06-30 DIAGNOSIS — I10 Essential (primary) hypertension: Secondary | ICD-10-CM | POA: Insufficient documentation

## 2011-06-30 DIAGNOSIS — M79609 Pain in unspecified limb: Secondary | ICD-10-CM | POA: Insufficient documentation

## 2011-06-30 DIAGNOSIS — M542 Cervicalgia: Secondary | ICD-10-CM | POA: Insufficient documentation

## 2011-06-30 DIAGNOSIS — M25519 Pain in unspecified shoulder: Secondary | ICD-10-CM | POA: Insufficient documentation

## 2011-12-24 ENCOUNTER — Encounter (HOSPITAL_COMMUNITY)
Admission: AD | Disposition: A | Discharge status: Home / Self Care | Payer: Self-pay | Source: Ambulatory Visit | Attending: Cardiovascular Disease

## 2011-12-24 ENCOUNTER — Inpatient Hospital Stay (HOSPITAL_COMMUNITY)
Admission: AD | Admit: 2011-12-24 | Discharge: 2011-12-25 | DRG: 287 | Disposition: A | Discharge status: Home / Self Care | Payer: Non-veteran care | Source: Ambulatory Visit | Attending: Cardiovascular Disease | Admitting: Cardiovascular Disease

## 2011-12-24 ENCOUNTER — Encounter (HOSPITAL_COMMUNITY): Payer: Self-pay | Admitting: Physician Assistant

## 2011-12-24 ENCOUNTER — Inpatient Hospital Stay (HOSPITAL_COMMUNITY): Payer: Non-veteran care

## 2011-12-24 ENCOUNTER — Inpatient Hospital Stay (HOSPITAL_COMMUNITY): Admission: AD | Admit: 2011-12-24 | Payer: Self-pay | Source: Ambulatory Visit | Admitting: Cardiovascular Disease

## 2011-12-24 DIAGNOSIS — I2109 ST elevation (STEMI) myocardial infarction involving other coronary artery of anterior wall: Secondary | ICD-10-CM

## 2011-12-24 DIAGNOSIS — R55 Syncope and collapse: Secondary | ICD-10-CM

## 2011-12-24 DIAGNOSIS — E876 Hypokalemia: Secondary | ICD-10-CM | POA: Diagnosis present

## 2011-12-24 DIAGNOSIS — F1210 Cannabis abuse, uncomplicated: Secondary | ICD-10-CM | POA: Diagnosis present

## 2011-12-24 DIAGNOSIS — I10 Essential (primary) hypertension: Secondary | ICD-10-CM | POA: Diagnosis present

## 2011-12-24 DIAGNOSIS — E860 Dehydration: Secondary | ICD-10-CM | POA: Diagnosis present

## 2011-12-24 DIAGNOSIS — R001 Bradycardia, unspecified: Secondary | ICD-10-CM

## 2011-12-24 DIAGNOSIS — I959 Hypotension, unspecified: Principal | ICD-10-CM | POA: Diagnosis present

## 2011-12-24 DIAGNOSIS — F1010 Alcohol abuse, uncomplicated: Secondary | ICD-10-CM | POA: Diagnosis present

## 2011-12-24 DIAGNOSIS — R9431 Abnormal electrocardiogram [ECG] [EKG]: Secondary | ICD-10-CM

## 2011-12-24 HISTORY — DX: Alcohol abuse, in remission: F10.11

## 2011-12-24 HISTORY — DX: Cannabis use, unspecified, uncomplicated: F12.90

## 2011-12-24 HISTORY — DX: Syncope and collapse: R55

## 2011-12-24 HISTORY — DX: Abnormal electrocardiogram (ECG) (EKG): R94.31

## 2011-12-24 HISTORY — DX: Bradycardia, unspecified: R00.1

## 2011-12-24 HISTORY — PX: CARDIAC CATHETERIZATION: SHX172

## 2011-12-24 HISTORY — DX: Essential (primary) hypertension: I10

## 2011-12-24 HISTORY — PX: LEFT HEART CATHETERIZATION WITH CORONARY ANGIOGRAM: SHX5451

## 2011-12-24 LAB — DIFFERENTIAL
Basophils Relative: 0 % (ref 0–1)
Lymphocytes Relative: 15 % (ref 12–46)
Lymphs Abs: 1.1 10*3/uL (ref 0.7–4.0)
Monocytes Absolute: 0.3 10*3/uL (ref 0.1–1.0)
Monocytes Relative: 5 % (ref 3–12)
Neutro Abs: 6 10*3/uL (ref 1.7–7.7)
Neutrophils Relative %: 80 % — ABNORMAL HIGH (ref 43–77)

## 2011-12-24 LAB — RAPID URINE DRUG SCREEN, HOSP PERFORMED
Benzodiazepines: POSITIVE — AB
Cocaine: NOT DETECTED
Opiates: NOT DETECTED

## 2011-12-24 LAB — COMPREHENSIVE METABOLIC PANEL
ALT: 14 U/L (ref 0–53)
ALT: 14 U/L (ref 0–53)
AST: 15 U/L (ref 0–37)
Alkaline Phosphatase: 41 U/L (ref 39–117)
Alkaline Phosphatase: 48 U/L (ref 39–117)
BUN: 15 mg/dL (ref 6–23)
CO2: 22 mEq/L (ref 19–32)
CO2: 23 mEq/L (ref 19–32)
Calcium: 8.5 mg/dL (ref 8.4–10.5)
Chloride: 102 mEq/L (ref 96–112)
Chloride: 102 mEq/L (ref 96–112)
GFR calc Af Amer: >90 mL/min (ref >90)
GFR calc non Af Amer: 75 mL/min — ABNORMAL LOW (ref >90)
Glucose, Bld: 95 mg/dL (ref 70–99)
Potassium: 3.1 mEq/L — ABNORMAL LOW (ref 3.5–5.1)
Potassium: 3.9 mEq/L (ref 3.5–5.1)
Sodium: 135 mEq/L (ref 135–145)
Sodium: 135 mEq/L (ref 135–145)
Total Bilirubin: 0.3 mg/dL (ref 0.3–1.2)
Total Protein: 6.5 g/dL (ref 6.0–8.3)

## 2011-12-24 LAB — CBC
HCT: 37.1 % — ABNORMAL LOW (ref 39.0–52.0)
HCT: 38.7 % — ABNORMAL LOW (ref 39.0–52.0)
Hemoglobin: 12.2 g/dL — ABNORMAL LOW (ref 13.0–17.0)
Hemoglobin: 13.3 g/dL (ref 13.0–17.0)
Hemoglobin: 13.7 g/dL (ref 13.0–17.0)
MCH: 31.8 pg (ref 26.0–34.0)
MCHC: 35.4 g/dL (ref 30.0–36.0)
MCHC: 35.8 g/dL (ref 30.0–36.0)
Platelets: 178 10*3/uL (ref 150–400)
RBC: 3.84 MIL/uL — ABNORMAL LOW (ref 4.22–5.81)
RBC: 4.37 MIL/uL (ref 4.22–5.81)
RDW: 12.8 % (ref 11.5–15.5)
WBC: 4.8 10*3/uL (ref 4.0–10.5)
WBC: 7.4 10*3/uL (ref 4.0–10.5)

## 2011-12-24 LAB — LIPID PANEL
Cholesterol: 149 mg/dL (ref 0–200)
LDL Cholesterol: 90 mg/dL (ref 0–99)
Total CHOL/HDL Ratio: 2.9 RATIO
Triglycerides: 36 mg/dL (ref <150)
VLDL: 7 mg/dL (ref 0–40)

## 2011-12-24 LAB — APTT: aPTT: 25 seconds (ref 24–37)

## 2011-12-24 LAB — PROTIME-INR
INR: 1.21 (ref 0.00–1.49)
INR: 1.17 (ref 0.00–1.49)

## 2011-12-24 LAB — CARDIAC PANEL(CRET KIN+CKTOT+MB+TROPI)
Relative Index: 1.6 (ref 0.0–2.5)
Relative Index: 1.6 (ref 0.0–2.5)
Troponin I: <0.3 ng/mL (ref <0.30)

## 2011-12-24 SURGERY — LEFT HEART CATHETERIZATION WITH CORONARY ANGIOGRAM
Anesthesia: LOCAL

## 2011-12-24 MED ORDER — ONDANSETRON HCL 4 MG/2ML IJ SOLN: 4.0000 mg | Freq: Four times a day (QID) | INTRAMUSCULAR | Status: DC | PRN

## 2011-12-24 MED ORDER — SODIUM CHLORIDE 0.9 % IV SOLN
INTRAVENOUS | Status: DC
  Administered 2011-12-24 – 2011-12-25 (×2): via INTRAVENOUS

## 2011-12-24 MED ORDER — ACETAMINOPHEN 325 MG PO TABS: 650.0000 mg | ORAL_TABLET | ORAL | Status: DC | PRN

## 2011-12-24 MED ORDER — ASPIRIN EC 81 MG PO TBEC
81.0000 mg | DELAYED_RELEASE_TABLET | Freq: Every day | ORAL | Status: DC
  Administered 2011-12-25: 81 mg via ORAL
  Filled 2011-12-24: qty 1

## 2011-12-24 MED ORDER — SODIUM CHLORIDE 0.9 % IV SOLN: 250.0000 mL | INTRAVENOUS | Status: DC | PRN

## 2011-12-24 MED ORDER — ZOLPIDEM TARTRATE 5 MG PO TABS: 5.0000 mg | ORAL_TABLET | Freq: Every evening | ORAL | Status: DC | PRN

## 2011-12-24 MED ORDER — ENOXAPARIN SODIUM 40 MG/0.4ML ~~LOC~~ SOLN
40.0000 mg | SUBCUTANEOUS | Status: DC
  Filled 2011-12-24: qty 0.4

## 2011-12-24 MED ORDER — ALPRAZOLAM 0.25 MG PO TABS: 0.2500 mg | ORAL_TABLET | Freq: Two times a day (BID) | ORAL | Status: DC | PRN

## 2011-12-24 MED ORDER — SODIUM CHLORIDE 0.9 % IJ SOLN: 3.0000 mL | INTRAMUSCULAR | Status: DC | PRN

## 2011-12-24 MED ORDER — SODIUM CHLORIDE 0.9 % IJ SOLN: 3.0000 mL | Freq: Two times a day (BID) | INTRAMUSCULAR | Status: DC

## 2011-12-24 MED ORDER — NITROGLYCERIN 0.4 MG SL SUBL: 0.4000 mg | SUBLINGUAL_TABLET | SUBLINGUAL | Status: DC | PRN

## 2011-12-24 NOTE — Progress Notes (Signed)
Chaplain responded to a Code STEMI. Chaplain escorted patient's daughter to a consultation room and connected her with the medical staff. No follow up needed.

## 2011-12-24 NOTE — H&P (Addendum)
History and Physical  Patient ID: [name] [name] Patient ID: [name] [name] MRN: [id number], DOB/AGE: 61/05/1951 61 y.o. Date of Encounter: 12/24/2011  Primary Physician: MD at the [facility] Primary Cardiologist: NONE  Chief Complaint:  Syncope, abnl ECG  HPI: [name] [name] is  60 year old male with no history of CAD. He had not eaten very much in the last day or so but he watches what he eats and is also a regular exerciser.   Today, he ate a bag of potato chips and drank a soda. He stood up to walk and had presyncope, sinking to the ground. He stood up and tried to walk 2 more times but was unable to do so. He then had a brief syncopal episode and was helped by bystanders. EMS was called and his ECG was abnormal and he was significantly hypotensive with a systolic BP in the 60s. Because of the abnormal ECG and his hypotension/syncope, he was taken directly to the cath lab.  PMH: HTN   Surgical History: Wrist surgery  Home meds: pending, pt not sure of the names/dosages  Allergies: NKDA  History   Social History  . Marital Status: Single    Spouse Name: N/A    Number of Children: N/A  . Years of Education: N/A   Occupational History  . Not on file.   Social History Main Topics  . Smoking status: Not on file  . Smokeless tobacco: Not on file  . Alcohol Use: Hx use, pt denies  . Drug Use: Yes    Special: Marijuana  . Sexually Active: Not on file   History   Social History Narrative   He was in foster homes as a child because of his mother's ETOH abuse. Pt also has a history of ETOH abuse and was hospitalized in 2002 for detox.    Family history : Mother is deceased, no known cardiac issues, no known cardiac issues in his father.  Review of Systems:   Full 14-point review of systems otherwise negative except as noted above.   Physical Exam: VS- BP 98/54, HR 55, RR 20 General: Well developed, well nourished, male in no acute distress. Head: Normocephalic, atraumatic,  sclera non-icteric, no xanthomas, nares are without discharge. Dentition:  OK Neck: No carotid bruits. JVD not elevated. No thyromegally Lungs: Good expansion bilaterally without wheezes or rhonchi. Few Rales Heart:Regular rate and rhythm with S1 S2.  No S3 or S4.  No murmur, no rubs, or gallops appreciated. Abdomen: Soft, non-tender, non-distended with normoactive bowel sounds. No hepatomegaly. No rebound/guarding. No obvious abdominal masses. Msk:  Strength and tone appear normal for age with good muscle tone. No joint deformities or effusions, no spine or costo-vertebral angle tenderness. Extremities: No clubbing or cyanosis. No edema.  Distal pedal pulses are 2+ and equal bilaterally. Neuro: Alert and oriented X 3. Moves all extremities spontaneously. No focal deficits noted. Psych:  Responds to questions appropriately with a normal affect. Skin: No rashes or lesions noted  Labs: pending    Radiology/Studies:  pending  ECG: sinus bradycardia, rate 44 bpm with anteroseptal and lateral ST elevation  ASSESSMENT AND PLAN:  1. Syncope/hypotension and abnl ECG - emergent cath, Dr [place] in to see pt who agrees to proceed. Further eval and treatement depending on the results. Continue IVF for hydration and f/u on home meds. Re-assess after cath.  Signed,  Bjorn Loser Barrett PA-C 12/24/2011, 2:16 PM  Patient seen and examined and history reviewed. Agree with above findings and plan.  Patient is a thin BM admitted with syncope and profound hypotension. Ecg showed anterolateral ST elevation. Cardiac cath is normal suggesting this is just early repolarization. Blood pressure is still low with low filling pressures consistent with volume depletion. Hgb normal by i-stat. Creatinine 1.2. Daughter reports his primary caregiver is the [facility]. Was admitted 4 months ago with atypical chest pain. Appetite and po intake have been very poor. Will admit for work up of dehydration and hydrate with IVF.  Theron Arista  [facility] 12/24/2011 2:53 PM

## 2011-12-24 NOTE — Progress Notes (Signed)
To x-ray via w/c on monitor and accompanied by RN for PA & Lateral x-ray as ordered. Pt tolerated procedure

## 2011-12-24 NOTE — CV Procedure (Signed)
   Cardiac Catheterization Procedure Note  Name: [name] [name] MRN: [id number] DOB: Oct 14, 1950  Procedure: Left Heart Cath, Selective Coronary Angiography, LV angiography  Indication: 62 year old black male presents with syncope and hypotension. ECG shows ST elevation anterior lateral leads.   Procedural details: The right groin was prepped, draped, and anesthetized with 1% lidocaine. Using modified Seldinger technique, a 6 French sheath was introduced into the right femoral artery. Standard Judkins catheters were used for coronary angiography and left ventriculography. Catheter exchanges were performed over a guidewire. There were no immediate procedural complications. The patient was transferred to the post catheterization recovery area for further monitoring.  Procedural Findings: Hemodynamics:  AO 75/47 with a mean of 60 mm of mercury LV 76 with an EDP of 12 mmHg   Coronary angiography: Coronary dominance: right  Left mainstem: Normal.  Left anterior descending (LAD): Normal.  Left circumflex (LCx): Normal.  Right coronary artery (RCA): Normal.  Left ventriculography: Left ventricular systolic function is normal, LVEF is estimated at 55-65%, there is no significant mitral regurgitation   Final Conclusions:   1. Normal coronary anatomy. 2. Normal left ventricular function.  Recommendations: Syncope appears to be related to hypotension with blood lesion. Patient will be admitted to telemetry and hydrated with IV fluids. We'll evaluate other causes of hypokalemia. ECG changes are felt to be related to early repolarization.  Kellianne Ek [place] 12/24/2011, 2:40 PM

## 2011-12-25 ENCOUNTER — Encounter (HOSPITAL_COMMUNITY): Payer: Self-pay | Admitting: Cardiology

## 2011-12-25 LAB — LIPID PANEL
LDL Cholesterol: 96 mg/dL (ref 0–99)
Triglycerides: 64 mg/dL (ref <150)
VLDL: 13 mg/dL (ref 0–40)

## 2011-12-25 LAB — POCT I-STAT, CHEM 8
BUN: 16 mg/dL (ref 6–23)
Creatinine, Ser: 1.2 mg/dL (ref 0.50–1.35)
Potassium: 3.1 mEq/L — ABNORMAL LOW (ref 3.5–5.1)
Sodium: 140 mEq/L (ref 135–145)

## 2011-12-25 LAB — CARDIAC PANEL(CRET KIN+CKTOT+MB+TROPI)
CK, MB: 2.5 ng/mL (ref 0.3–4.0)
Relative Index: 1.6 (ref 0.0–2.5)
Troponin I: <0.3 ng/mL (ref <0.30)
Troponin I: <0.3 ng/mL (ref <0.30)

## 2011-12-25 LAB — HEMOGLOBIN A1C: Hgb A1c MFr Bld: 5.9 % — ABNORMAL HIGH (ref <5.7)

## 2011-12-25 NOTE — Progress Notes (Signed)
UR Completed. Simmons, Jaspreet Hollings F [phone]  

## 2011-12-25 NOTE — Progress Notes (Signed)
   CARE MANAGEMENT NOTE 12/25/2011  Patient:  JAELYN, CLONINGER   Account Number:  [id number]  Date Initiated:  12/25/2011  Documentation initiated by:  GRAVES-BIGELOW,Lateefah Mallery  Subjective/Objective Assessment:   Pt in with syncope hypotension and abnl ECG - taken directly to the cath lab. Plan to hydrate with IVF.  Blood pressure is normal post hydration.     Action/Plan:   CM did call [facility] to assist with bill. CM did call [facility] to make them aware that pt was at hospital and will be d/c today.   Anticipated DC Date:  12/25/2011   Anticipated DC Plan:  HOME/SELF CARE  In-house referral  Financial Counselor      DC Planning Services  CM consult      Choice offered to / List presented to:             Status of service:  Completed, signed off Medicare Important Message given?   (If response is "NO", the following Medicare IM given date fields will be blank) Date Medicare IM given:   Date Additional Medicare IM given:    Discharge Disposition:  HOME/SELF CARE  Per UR Regulation:    If discussed at Long Length of Stay Meetings, dates discussed:    Comments:  12-25-11 [address] Tomi Bamberger, RN,BSN  [phone] Pt states he gets medicaitons from [facility] [place]. No further needs from CM at this time.

## 2011-12-25 NOTE — Progress Notes (Signed)
TELEMETRY: Reviewed telemetry pt in NSR: Filed Vitals:   12/24/11 2320 12/25/11 0000 12/25/11 0200 12/25/11 0400  BP:  108/49 113/67 127/72  Pulse: 58 55 54 68  Temp:  98.3 F (36.8 C)  97.9 F (36.6 C)  TempSrc:  Oral  Oral  Resp: 17 18 12 19   Height:      Weight:    65 kg (143 lb 4.8 oz)  SpO2: 100% 99% 100% 100%    Intake/Output Summary (Last 24 hours) at 12/25/11 0823 Last data filed at 12/25/11 0400  Gross per 24 hour  Intake 1268.75 ml  Output   1010 ml  Net 258.75 ml    SUBJECTIVE Feels much better today. No dizzyness. No pain.  LABS: Basic Metabolic Panel:  Basename 12/24/11 1731 12/24/11 1430  NA 135 135  K 3.9 3.1*  CL 102 102  CO2 23 22  GLUCOSE 95 115*  BUN 15 15  CREATININE 0.83 1.05  CALCIUM 9.2 8.5  MG -- --  PHOS -- --   Liver Function Tests:  [facility] 12/24/11 1731 12/24/11 1430  AST 16 15  ALT 14 14  ALKPHOS 48 41  BILITOT 0.3 0.4  PROT 6.5 5.6*  ALBUMIN 3.6 3.2*   No results found for this basename: LIPASE:2,AMYLASE:2 in the last 72 hours CBC:  Basename 12/24/11 1732 12/24/11 1731  WBC 7.1 7.4  NEUTROABS -- 6.0  HGB 13.7 13.3  HCT 38.7* 37.1*  MCV 88.6 88.3  PLT 191 195   Cardiac Enzymes:  Basename 12/25/11 0519 12/24/11 2322 12/24/11 1731  CKTOTAL 152 159 189  CKMB 2.6 2.5 3.0  CKMBINDEX -- -- --  TROPONINI <0.30 <0.30 <0.30   BNP: No components found with this basename: POCBNP:3 D-Dimer: No results found for this basename: DDIMER:2 in the last 72 hours Hemoglobin A1C:  Basename 12/24/11 1731  HGBA1C 5.9*   Fasting Lipid Panel:  Basename 12/25/11 0519  CHOL 161  HDL 52  LDLCALC 96  TRIG 64  CHOLHDL 3.1  LDLDIRECT --   Thyroid Function Tests:  Basename 12/24/11 1731  TSH 0.723  T4TOTAL --  T3FREE --  THYROIDAB --   Anemia Panel: No results found for this basename: VITAMINB12,FOLATE,FERRITIN,TIBC,IRON,RETICCTPCT in the last 72 hours  Radiology/Studies:  X-ray Chest Pa And Lateral  12/25/2011   *RADIOLOGY REPORT*  Clinical Data: Syncope.  CHEST - 2 VIEW  Comparison: Chest radiograph performed 06/11/2008  Findings: The lungs are well-aerated and clear.  There is no evidence of focal opacification, pleural effusion or pneumothorax. Bilateral nipple shadows are seen.  The heart is normal in size; the mediastinal contour is within normal limits.  No acute osseous abnormalities are seen.  IMPRESSION: No acute cardiopulmonary process seen.  Original Report Authenticated By: Tonia Ghent, M.D.    PHYSICAL EXAM General: Well developed, thin, in no acute distress. Head: Normocephalic, atraumatic, sclera non-icteric, no xanthomas, nares are without discharge. Neck: Negative for carotid bruits. JVD not elevated. Lungs: Clear bilaterally to auscultation without wheezes, rales, or rhonchi. Breathing is unlabored. Heart: RRR S1 S2 without murmurs, rubs, or gallops.  Abdomen: Soft, non-tender, non-distended with normoactive bowel sounds. No hepatomegaly. No rebound/guarding. No obvious abdominal masses. Msk:  Strength and tone appears normal for age. Extremities: No clubbing, cyanosis or edema.  Distal pedal pulses are 2+ and equal bilaterally. Neuro: Alert and oriented X 3. Moves all extremities spontaneously. Psych:  Responds to questions appropriately with a normal affect.  ASSESSMENT AND PLAN: 1. Presyncope related to hypotension related  to dehydration. Blood pressure is normal post hydration. Labs suprisingly look very good. Patient reports he takes a blood pressure medication but cannot remember the name. Gets primary [facility] at [facility] 2. Abnormal Ecg c/w early repolarization.  Plan: DC IV fluids. Ambulate. Anticipate DC today. Hold BP medication. Encourage good hydration.   Principal Problem:  *Hypotension Active Problems:  Syncope  Volume depletion    Signed, Shakera Ebrahimi [place] MD,FACC 12/25/2011 8:27 AM

## 2011-12-25 NOTE — Progress Notes (Signed)
Clinical Social Work Department BRIEF PSYCHOSOCIAL ASSESSMENT 12/25/2011  Patient:  MINOR, IDEN     Account Number:  [id number]     Admit date:  12/24/2011  Clinical Social Worker:  Hulan Fray  Date/Time:  12/25/2011 09:53 AM  Referred by:  RN  Date Referred:  12/24/2011 Referred for  Advanced Directives   Other Referral:   Interview type:  Patient Other interview type:    PSYCHOSOCIAL DATA Living Status:  ALONE Admitted from facility:   Level of care:   Primary support name:  [name] Primary support relationship to patient:  CHILD, ADULT Degree of support available:   good    CURRENT CONCERNS Current Concerns  Other - See comment   Other Concerns:   Advance directive request    SOCIAL WORK ASSESSMENT / PLAN Clinical Social Worker received referral for advance directive request. CSW provided patient with the advance directive packet and MOST form to be filled out with his physician. Patient stated that he receives services from the [place]. CSW informed patient that hospital can notarize form during this admission, and places that can notarize outside the hospital. Patient did not have any further questions. CSW will sign off as social work intervention is no longer needed.   Assessment/plan status:  No Further Intervention Required Other assessment/ plan:   Information/referral to community resources:   Engineer, production and MOST form    PATIENT'S/FAMILY'S RESPONSE TO PLAN OF CARE: Patient was appreciative of  advance directive packet.

## 2011-12-25 NOTE — Discharge Summary (Signed)
Discharge Summary   Patient ID: Beni Turrell MRN: [id number], DOB/AGE: [birth year] 61 y.o.  Primary MD: [facility] Primary Cardiologist: None  Admit date: 12/24/2011 D/C date:     12/25/2011     Primary Discharge Diagnoses:  1. Syncope  - 2/2 Hypotension in the setting of dehydration  - Improved with hydration and holding BP meds 2. Abnormal EKG  - anteroseptal and lateral ST elevation, felt r/t early repolarization  - Cardiac cath 12/24/11 - normal coronary anatomy, EF 55-65%  Secondary Discharge Diagnoses:  1. HTN 2. Marijuana abuse 3. H/o alcohol abuse - hospitalized for detox 2002 4. Wrist surgery  Allergies No Known Allergies  Diagnostic Studies/Procedures:   12/24/2011 - Cardiac Cath  Hemodynamics:  AO 75/47 with a mean of 60 mm of mercury  LV 76 with an EDP of 12 mmHg  Coronary angiography:  Coronary dominance: right  Left mainstem: Normal.  Left anterior descending (LAD): Normal.  Left circumflex (LCx): Normal.  Right coronary artery (RCA): Normal.  Left ventriculography: Left ventricular systolic function is normal, LVEF is estimated at 55-65%, there is no significant mitral regurgitation  Final Conclusions:  1. Normal coronary anatomy.  2. Normal left ventricular function.  Recommendations: Syncope appears to be related to hypotension with blood lesion. Patient will be admitted to telemetry and hydrated with IV fluids. We'll evaluate other causes of hypokalemia. ECG changes are felt to be related to early repolarization.   History of Present Illness: 61 y.o. male w/ medical problems as above who presented to [facility] on 12/24/11 after a syncopal episode.  On the day of presentation he ate a bag of potato chips and drank a soda then when he stood up to walk he had presyncope symptoms and sank to the ground. He stood up and tried to walk 2 more times but was unable to do so. He then had a brief syncopal episode and was helped by bystanders. EMS was  called and his ECG was abnormal and he was significantly hypotensive with a systolic BP in the 60s. Because of the abnormal ECG and his hypotension/syncope, he was taken directly to the cath lab at [facility] Course: At [facility] EKG revealed sinus bradycardia, 44 bpm, with anteroseptal and lateral ST elevation. CXR was without acute cardiopulmonary abnormalities. He underwent cardiac catheterization which revealed normal coronary arteries and LV function, EF 55-65%. It was felt his EKG changes were related to early repolarization and his syncope related to hypotension in the setting of dehydration. Cardiac enzymes were cycled and remained negative. Renal/hepatic function,TSH, A1C, and lipid panel WNL. His home Lisinopril was stopped and he was hydrated with IVF with improvement in symptoms and BP.    He was seen and evaluated by Dr. [place] who felt he was stable for discharge home with plans for follow up as scheduled below.  Discharge Vitals: Blood pressure 127/72, pulse 68, temperature 97.9 F (36.6 C), temperature source Oral, resp. rate 19, height 5\' 6"  (1.676 m), weight 143 lb 4.8 oz (65 kg), SpO2 100.00%.  Labs: Component Value Date   WBC 7.1 12/24/2011   HGB 13.7 12/24/2011   HCT 38.7* 12/24/2011   MCV 88.6 12/24/2011   PLT 191 12/24/2011    Lab 12/24/11 1731  NA 135  K 3.9  CL 102  CO2 23  BUN 15  CREATININE 0.83  CALCIUM 9.2  PROT 6.5  BILITOT 0.3  ALKPHOS 48  ALT 14  AST 16  GLUCOSE 95  Basename 12/25/11 0519 12/24/11 2322 12/24/11 1731 12/24/11 1430  CKTOTAL 152 159 189 177  CKMB 2.6 2.5 3.0 2.9  TROPONINI <0.30 <0.30 <0.30 <0.30   Component Value Date   CHOL 161 12/25/2011   HDL 52 12/25/2011   LDLCALC 96 12/25/2011   TRIG 64 12/25/2011     12/24/2011 17:31  Hemoglobin A1C 5.9 (H)     12/24/2011 17:31  TSH 0.723     12/24/2011 18:08  AMPHETAMINES NONE DETECTED  Barbiturates NONE DETECTED  Benzodiazepines POSITIVE (A)  Opiates NONE DETECTED    COCAINE NONE DETECTED  Tetrahydrocannabinol POSITIVE (A)     Discharge Medications   Medication List  As of 12/25/2011 10:15 AM   STOP taking these medications         lisinopril 10 MG tablet         TAKE these medications         GOODY HEADACHE PO   Take 1 packet by mouth daily as needed. For pain/headache      mulitivitamin with minerals Tabs   Take 1 tablet by mouth daily.            Disposition   Discharge Orders    Future Orders Please Complete By Expires   Diet - low sodium heart healthy      Increase activity slowly      Discharge instructions      Comments:   Please drink plenty of fluids.   We stopped your blood pressure medicine. Please follow up with your primary care provider for decisions regarding your blood pressure management.     Follow-up Information    Follow up with Your Primary Care Provider. Schedule an appointment as soon as possible for a visit in 1 week.          Outstanding Labs/Studies:  none  Duration of Discharge Encounter: Greater than 30 minutes including physician and PA time.  Signed, Brittny Spangle PA-C 12/25/2011, 10:15 AM

## 2011-12-26 NOTE — Discharge Summary (Signed)
Patient seen and examined and history reviewed. Agree with above findings and plan. See rounding note earlier in the day.  Theron Arista JordanMD 12/26/2011 2:06 PM

## 2012-11-20 IMAGING — CT CT HEAD W/O CM
1 of 2 series · 13 of 30 positions shown, 17 images · non-contrast
Comparison: None.

CLINICAL DATA: Headache.  Hypertension.

CT HEAD WITHOUT CONTRAST
TECHNIQUE: Contiguous axial images were obtained from the base of
the skull through the vertex without contrast.

[Series 2: brain · axial · 0.47mm/px · z∈[+118,+252]mm · 13 of 32 slices shown, 17 images]
[im 3/32  brain]
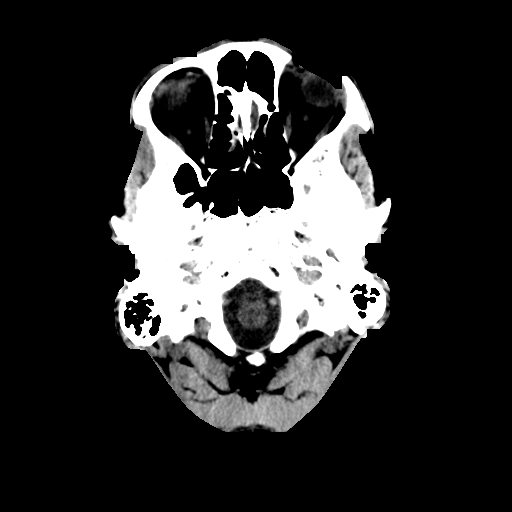
[im 3/32  bone]
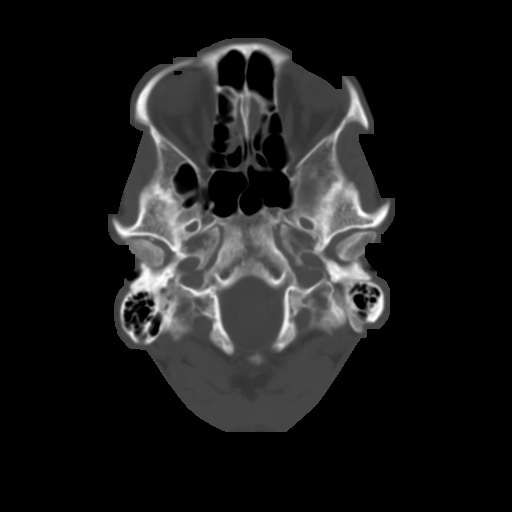
[im 5/32  brain]
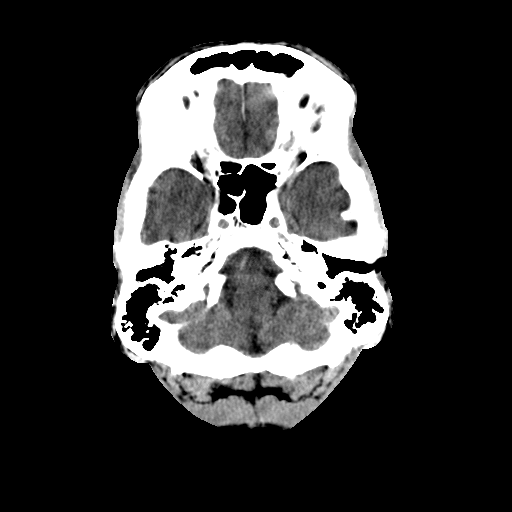
[im 7/32  brain]
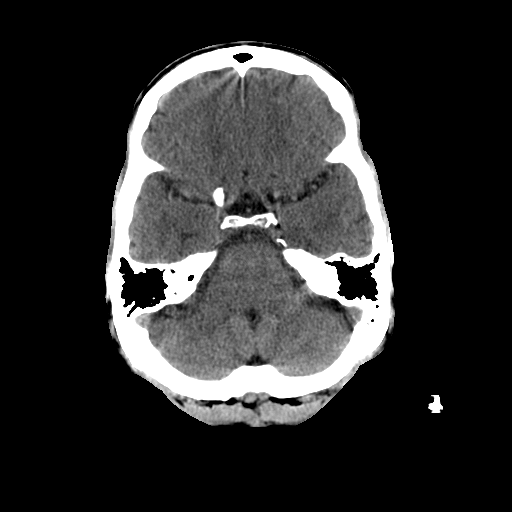
[im 9/32  brain]
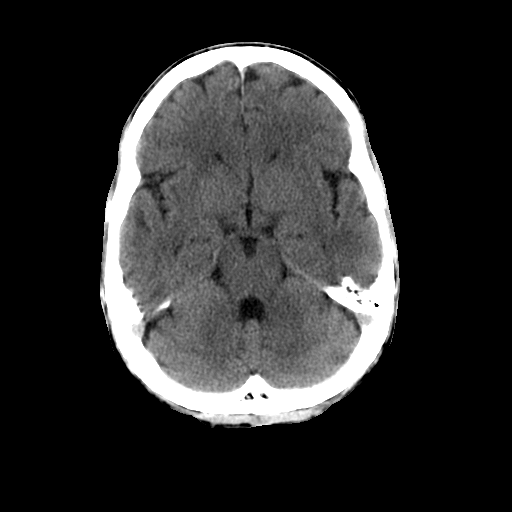
[im 12/32  brain]
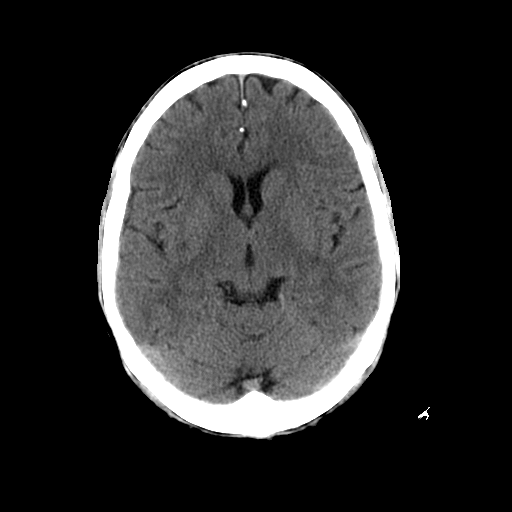
[im 12/32  bone]
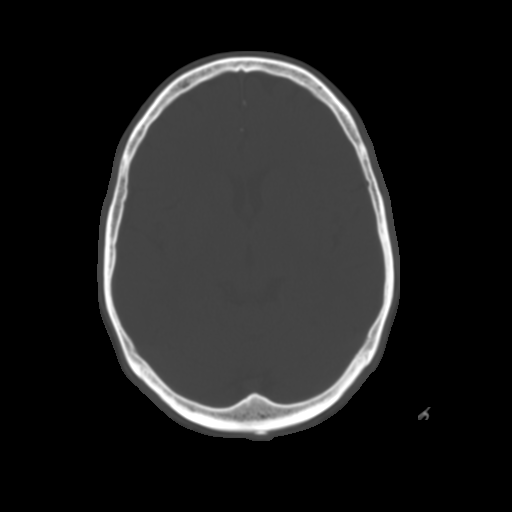
[im 14/32  brain]
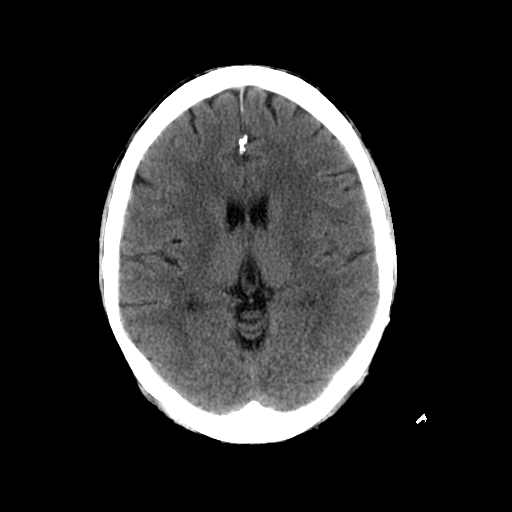
[im 16/32  brain]
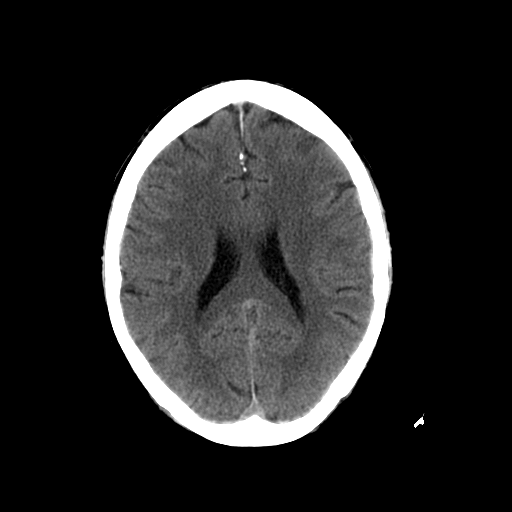
[im 18/32  brain]
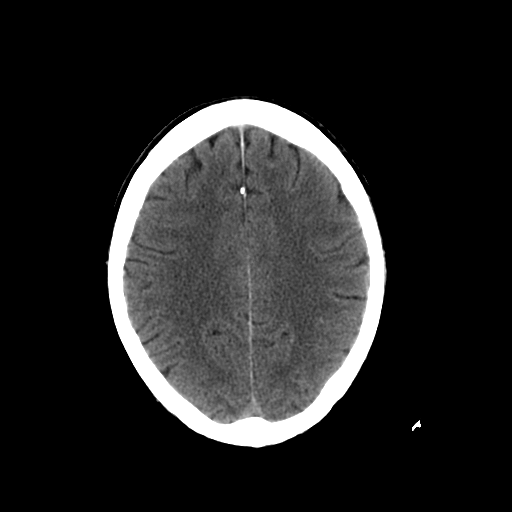
[im 20/32  brain]
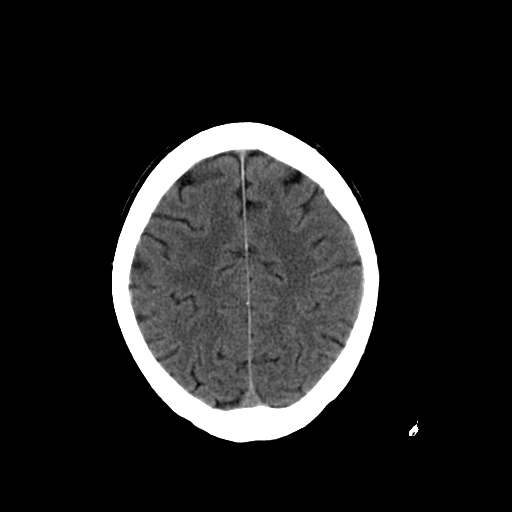
[im 20/32  bone]
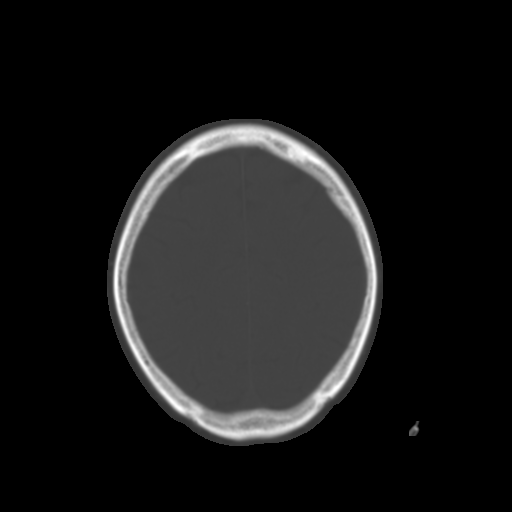
[im 23/32  brain]
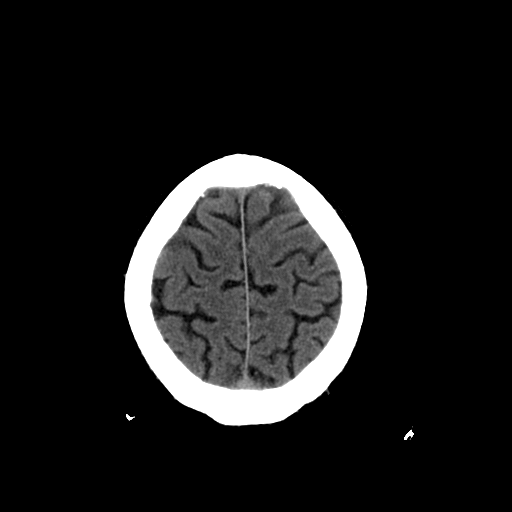
[im 25/32  brain]
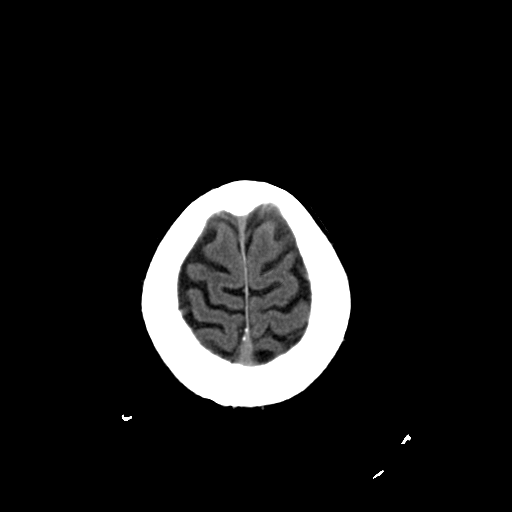
[im 27/32  brain]
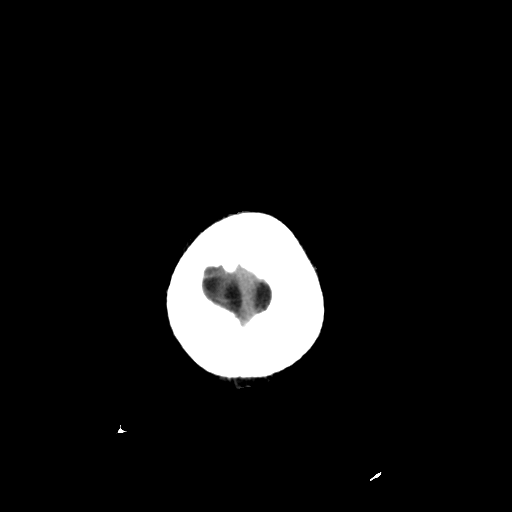
[im 29/32  brain]
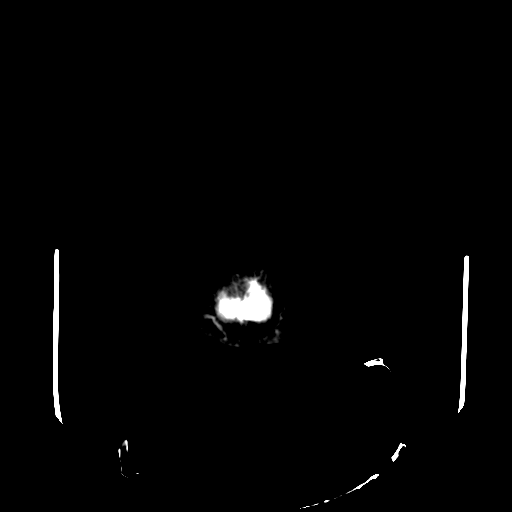
[im 29/32  bone]
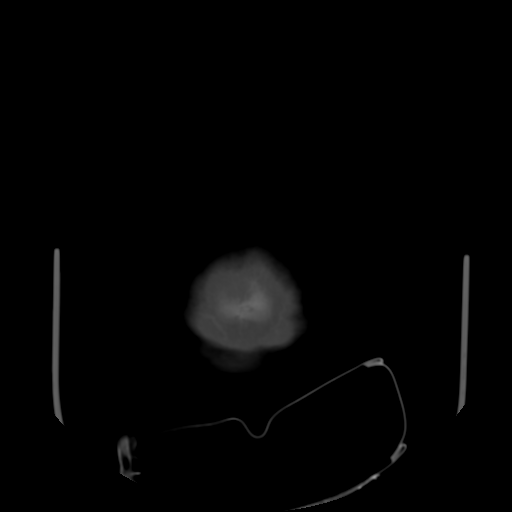

[13 of 30 positions shown; findings below may reference images not displayed]

FINDINGS: There is no evidence of acute intracranial abnormality
including acute infarction, hemorrhage, mass lesion, mass effect,
midline shift or abnormal extra-axial fluid collection.  No
hydrocephalus or pneumocephalus.  The calvarium is intact.  Imaged
paranasal sinuses and mastoid air cells are clear.
IMPRESSION: Normal exam.

## 2013-04-13 IMAGING — CR DG SHOULDER 2+V*L*
3 series · 3 of 3 positions shown · non-contrast
Comparison: Chest radiographs 06/11/2008.

CLINICAL DATA: Left shoulder pain.  No known injury.

LEFT SHOULDER - 2+ VIEW

[w shoulder ap internal left]
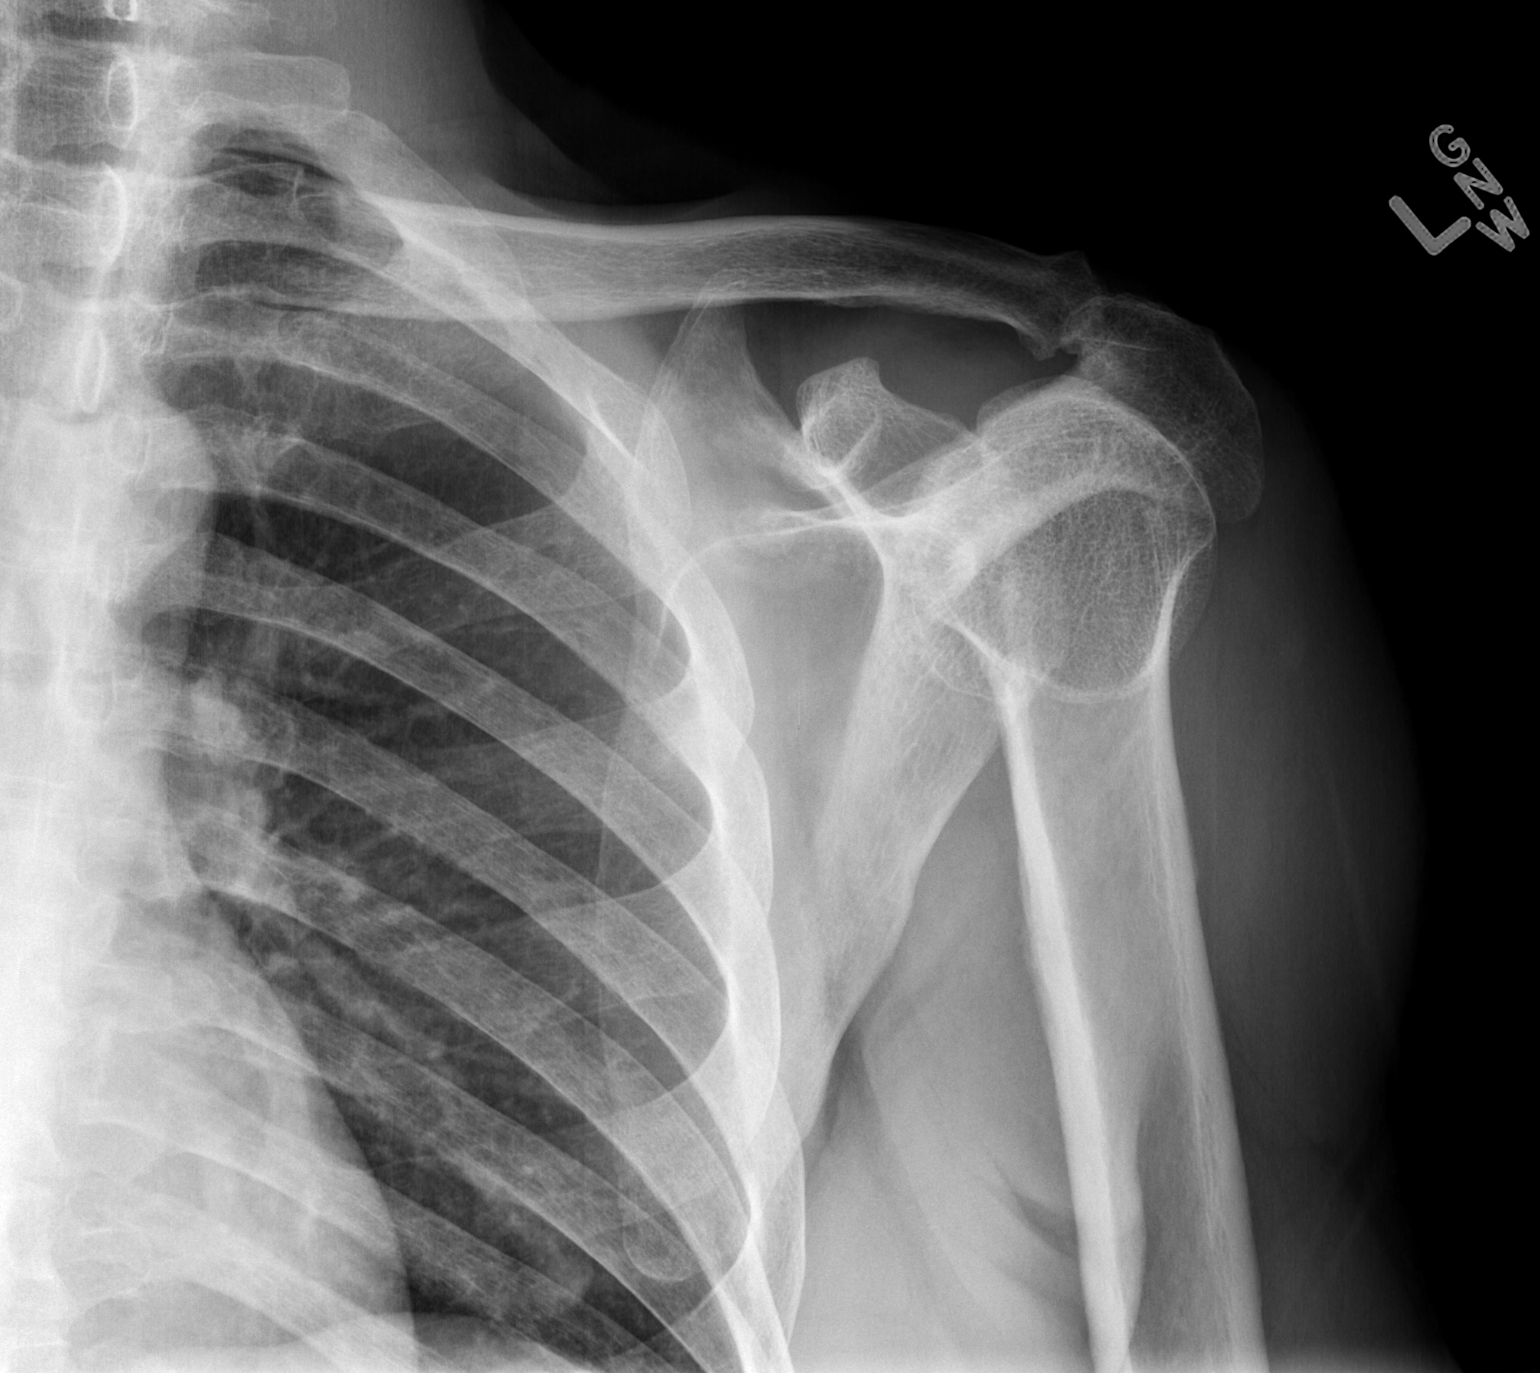

[w shoulder ap external left]
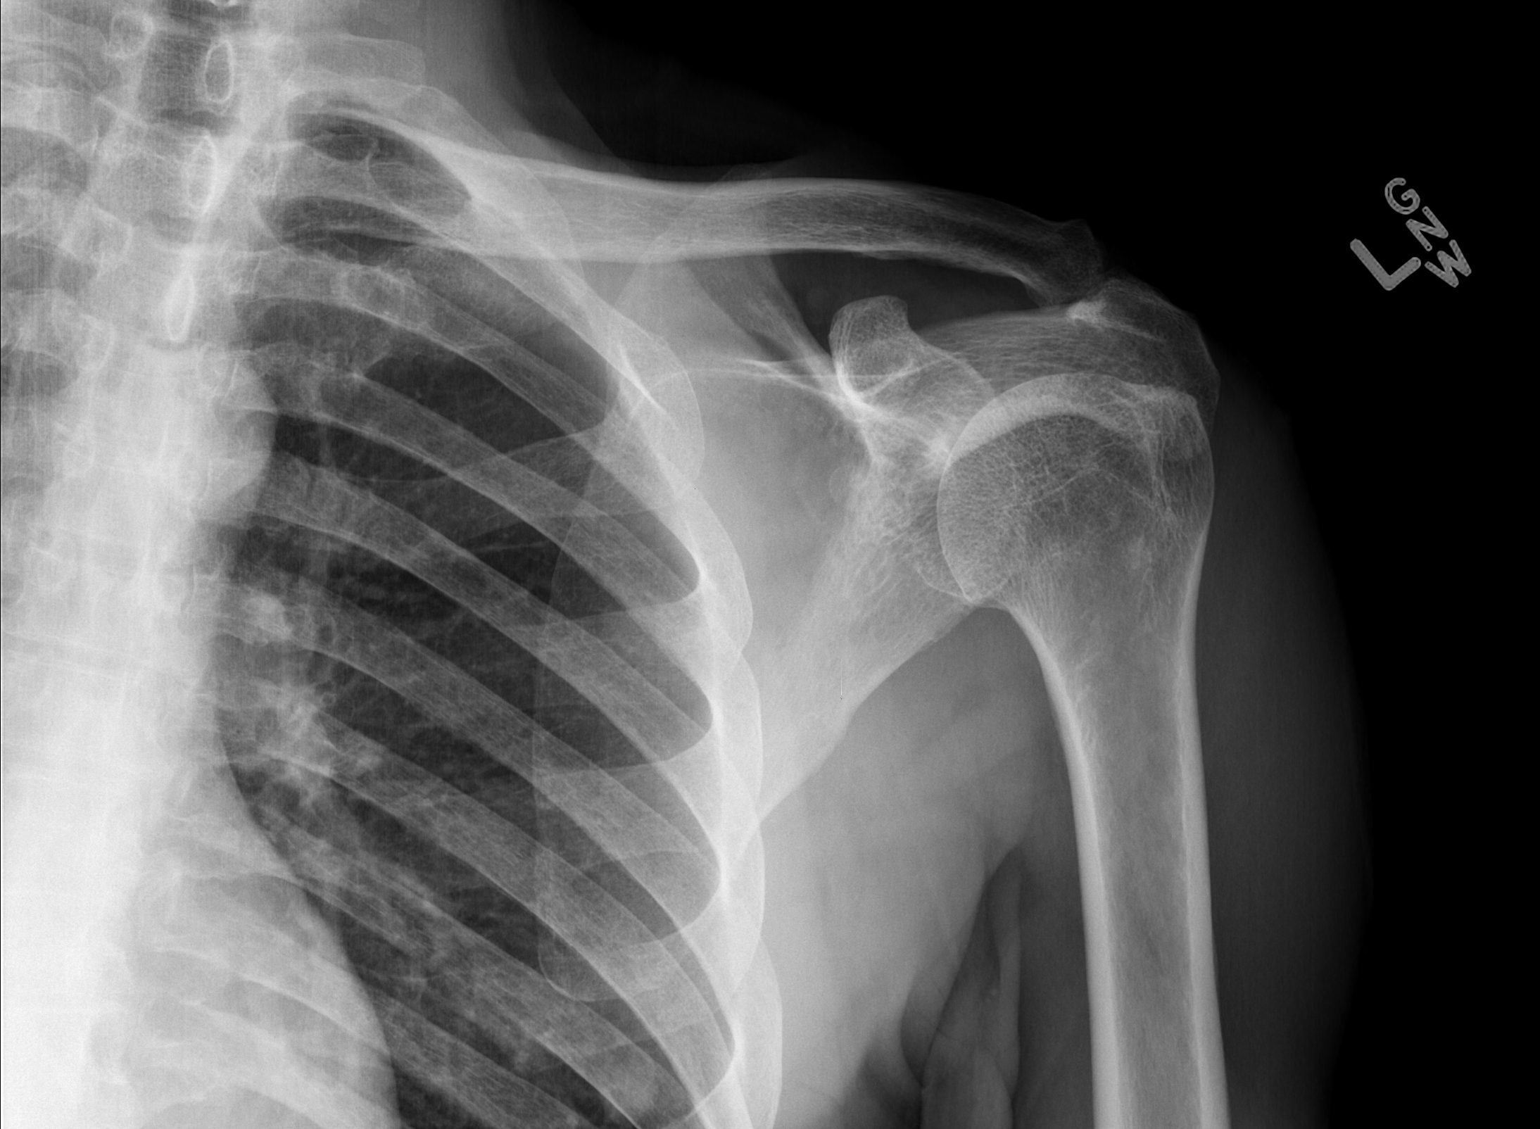

[w shoulder y view left]
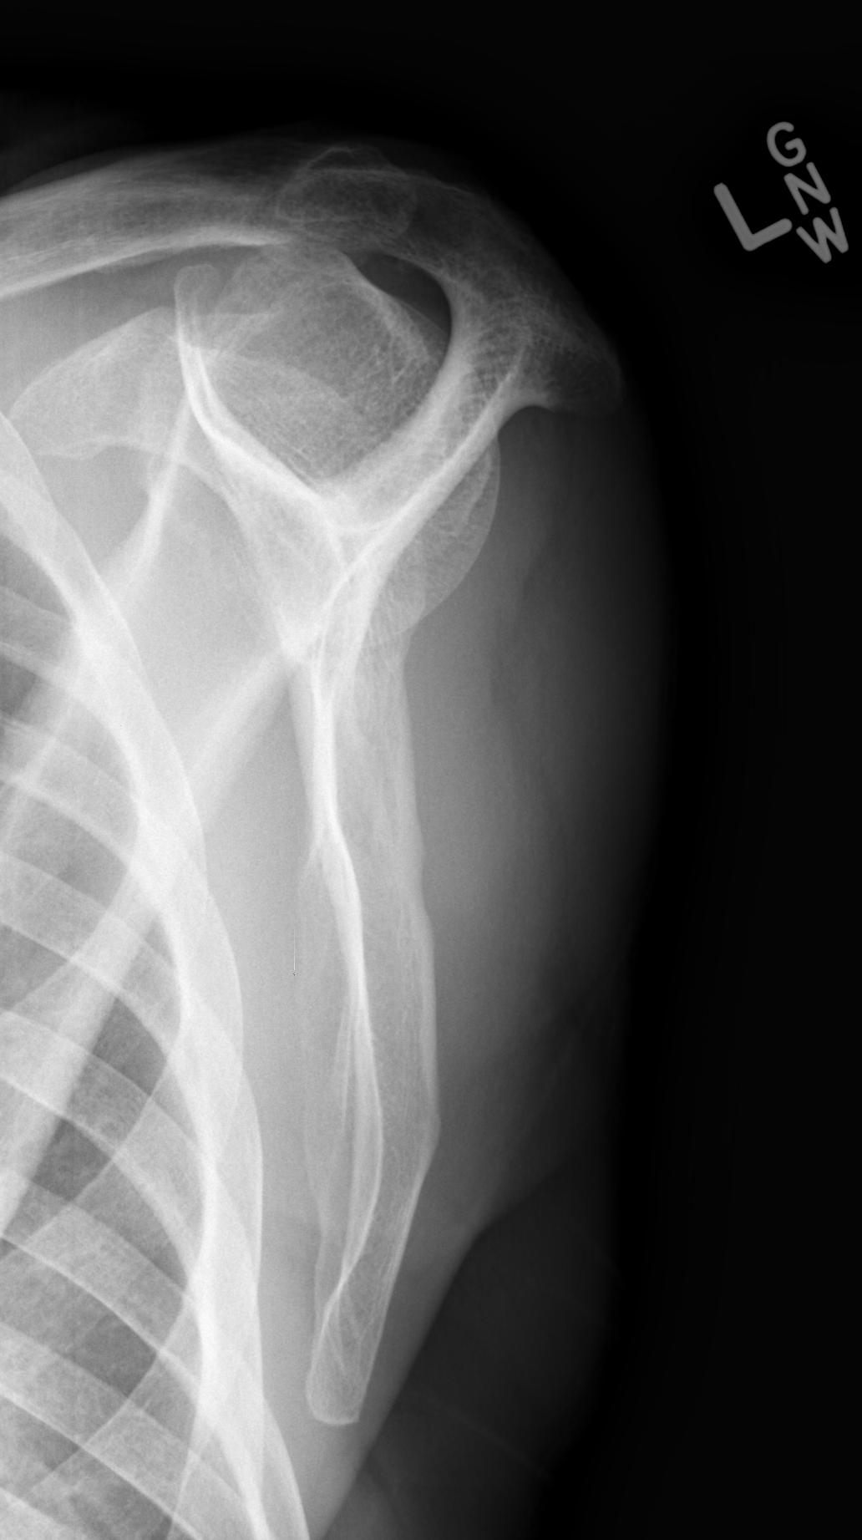

[3 of 3 positions shown; findings below may reference images not displayed]

FINDINGS: There is no evidence of acute fracture or dislocation.
Mild acromioclavicular degenerative changes are present.  The
subacromial space is preserved.  There is questionable mild
impaction of the humeral head anteriorly on the internally rotated
view.  This could be due to superimposition of the tuberosities.
There is no Hill-Sachs deformity.  An oval density projecting over
the scapula has an appearance most consistent with an overlying
button/snap. No definite loose bodies are identified. There is a
probable prominent nipple shadow, unchanged from the prior
radiographs.
IMPRESSION: 1. No acute osseous findings.  Mild acromioclavicular degenerative
changes.
2.  Possible atypical old impaction injury of the humeral head
anteriorly versus superimposition of the tuberosities.

## 2013-09-29 ENCOUNTER — Encounter (HOSPITAL_COMMUNITY): Payer: Self-pay | Admitting: Emergency Medicine

## 2013-09-29 DIAGNOSIS — Z9889 Other specified postprocedural states: Secondary | ICD-10-CM | POA: Insufficient documentation

## 2013-09-29 DIAGNOSIS — R55 Syncope and collapse: Secondary | ICD-10-CM | POA: Insufficient documentation

## 2013-09-29 DIAGNOSIS — Z79899 Other long term (current) drug therapy: Secondary | ICD-10-CM | POA: Insufficient documentation

## 2013-09-29 DIAGNOSIS — R42 Dizziness and giddiness: Secondary | ICD-10-CM | POA: Insufficient documentation

## 2013-09-29 DIAGNOSIS — I10 Essential (primary) hypertension: Secondary | ICD-10-CM | POA: Insufficient documentation

## 2013-09-29 LAB — COMPREHENSIVE METABOLIC PANEL
ALBUMIN: 3.7 g/dL (ref 3.5–5.2)
ALT: 14 U/L (ref 0–53)
AST: 18 U/L (ref 0–37)
Alkaline Phosphatase: 54 U/L (ref 39–117)
BILIRUBIN TOTAL: 0.2 mg/dL — AB (ref 0.3–1.2)
BUN: 11 mg/dL (ref 6–23)
CHLORIDE: 105 meq/L (ref 96–112)
CO2: 22 meq/L (ref 19–32)
CREATININE: 1.06 mg/dL (ref 0.50–1.35)
Calcium: 9.1 mg/dL (ref 8.4–10.5)
GFR calc Af Amer: 85 mL/min — ABNORMAL LOW (ref >90)
GFR, EST NON AFRICAN AMERICAN: 73 mL/min — AB (ref >90)
Glucose, Bld: 106 mg/dL — ABNORMAL HIGH (ref 70–99)
Potassium: 4 mEq/L (ref 3.7–5.3)
SODIUM: 141 meq/L (ref 137–147)
Total Protein: 7.3 g/dL (ref 6.0–8.3)

## 2013-09-29 LAB — CBC WITH DIFFERENTIAL/PLATELET
BASOS ABS: 0 10*3/uL (ref 0.0–0.1)
BASOS PCT: 0 % (ref 0–1)
Eosinophils Absolute: 0.1 10*3/uL (ref 0.0–0.7)
Eosinophils Relative: 2 % (ref 0–5)
HCT: 38 % — ABNORMAL LOW (ref 39.0–52.0)
Hemoglobin: 13 g/dL (ref 13.0–17.0)
LYMPHS PCT: 29 % (ref 12–46)
Lymphs Abs: 1.5 10*3/uL (ref 0.7–4.0)
MCH: 31.8 pg (ref 26.0–34.0)
MCHC: 34.2 g/dL (ref 30.0–36.0)
MCV: 92.9 fL (ref 78.0–100.0)
Monocytes Absolute: 0.4 10*3/uL (ref 0.1–1.0)
Monocytes Relative: 7 % (ref 3–12)
NEUTROS ABS: 3.3 10*3/uL (ref 1.7–7.7)
NEUTROS PCT: 62 % (ref 43–77)
PLATELETS: 228 10*3/uL (ref 150–400)
RBC: 4.09 MIL/uL — ABNORMAL LOW (ref 4.22–5.81)
RDW: 13.4 % (ref 11.5–15.5)
WBC: 5.4 10*3/uL (ref 4.0–10.5)

## 2013-09-29 LAB — ETHANOL: Alcohol, Ethyl (B): 11 mg/dL (ref 0–11)

## 2013-09-29 LAB — GLUCOSE, CAPILLARY: Glucose-Capillary: 102 mg/dL — ABNORMAL HIGH (ref 70–99)

## 2013-09-29 NOTE — ED Notes (Signed)
The pt has had an episode of dizziness  Feeling hot and sweating  For no reason earlier today.  He wa salso incontinetn of urine and was not aware of that.  No pain anywhere.  No history of seizures.  He drinks alcohol every day and h last had a drink earlier today

## 2013-09-30 ENCOUNTER — Emergency Department (HOSPITAL_COMMUNITY)
Admission: EM | Admit: 2013-09-30 | Discharge: 2013-09-30 | Disposition: A | Discharge status: Home / Self Care | Payer: Non-veteran care | Attending: Emergency Medicine | Admitting: Emergency Medicine

## 2013-09-30 DIAGNOSIS — R55 Syncope and collapse: Secondary | ICD-10-CM

## 2013-09-30 LAB — URINALYSIS, ROUTINE W REFLEX MICROSCOPIC
Bilirubin Urine: NEGATIVE
GLUCOSE, UA: NEGATIVE mg/dL
HGB URINE DIPSTICK: NEGATIVE
KETONES UR: NEGATIVE mg/dL
LEUKOCYTES UA: NEGATIVE
Nitrite: NEGATIVE
PH: 5.5 (ref 5.0–8.0)
PROTEIN: NEGATIVE mg/dL
Specific Gravity, Urine: 1.021 (ref 1.005–1.030)
Urobilinogen, UA: 0.2 mg/dL (ref 0.0–1.0)

## 2013-09-30 LAB — RAPID URINE DRUG SCREEN, HOSP PERFORMED
AMPHETAMINES: NOT DETECTED
BENZODIAZEPINES: NOT DETECTED
Barbiturates: NOT DETECTED
Cocaine: NOT DETECTED
OPIATES: NOT DETECTED
TETRAHYDROCANNABINOL: POSITIVE — AB

## 2013-09-30 MED ORDER — SODIUM CHLORIDE 0.9 % IV BOLUS (SEPSIS)
1000.0000 mL | Freq: Once | INTRAVENOUS | Status: AC
  Administered 2013-09-30: 1000 mL via INTRAVENOUS

## 2013-09-30 NOTE — Discharge Instructions (Signed)
We saw you in the ER for the NEAR fainting and dizziness All the results in the ER are normal, labs and imaging. We are not sure what is causing your symptoms. The workup in the ER is not complete, and is limited to screening for life threatening and emergent conditions only, so please see a primary care doctor for further evaluation.   Near-Syncope Near-syncope (commonly known as near fainting) is sudden weakness, dizziness, or feeling like you might pass out. During an episode of near-syncope, you may also develop pale skin, have tunnel vision, or feel sick to your stomach (nauseous). Near-syncope may occur when getting up after sitting or while standing for a long time. It is caused by a sudden decrease in blood flow to the brain. This decrease can result from various causes or triggers, most of which are not serious. However, because near-syncope can sometimes be a sign of something serious, a medical evaluation is required. The specific cause is often not determined. HOME CARE INSTRUCTIONS  Monitor your condition for any changes. The following actions may help to alleviate any discomfort you are experiencing:  Have someone stay with you until you feel stable.  Lie down right away if you start feeling like you might faint. Breathe deeply and steadily. Wait until all the symptoms have passed. Most of these episodes last only a few minutes. You may feel tired for several hours.   Drink enough fluids to keep your urine clear or pale yellow.   If you are taking blood pressure or heart medicine, get up slowly when seated or lying down. Take several minutes to sit and then stand. This can reduce dizziness.  Follow up with your health care provider as directed. SEEK IMMEDIATE MEDICAL CARE IF:   You have a severe headache.   You have unusual pain in the chest, abdomen, or back.   You are bleeding from the mouth or rectum, or you have black or tarry stool.   You have an irregular or very  fast heartbeat.   You have repeated fainting or have seizure-like jerking during an episode.   You faint when sitting or lying down.   You have confusion.   You have difficulty walking.   You have severe weakness.   You have vision problems.  MAKE SURE YOU:   Understand these instructions.  Will watch your condition.  Will get help right away if you are not doing well or get worse. Document Released: 08/31/2005 Document Revised: 05/03/2013 Document Reviewed: 02/03/2013 [facility] Patient Information 2014 [place].

## 2013-09-30 NOTE — ED Notes (Signed)
The pt has no complaints.  Family at the bedside asking  When doctor is coming

## 2013-09-30 NOTE — ED Provider Notes (Signed)
CSN: [id number]     Arrival date & time 09/29/13  2113 History   First MD Initiated Contact with Patient 09/30/13 0035     Chief Complaint  Patient presents with  . Near Syncope   (Consider location/radiation/quality/duration/timing/severity/associated sxs/prior Treatment) HPI Comments: Pt comes in with cc of near syncope. Pt has no medical hx. Pt states that today, he has had a few episodes of dizziness, and naur fainting - all with getting up, and at least 1 episode this afternoon when he felt hot and sweaty. Pt had no chest pain, palpitations, DIB. No family hx of premature CAD, no personal hx of CAD, CHF, syncope. This afternoon, per patient, his sister states that he was not responding for few minutes and was incontinent. There is no hx of seizures, no headaches. Pt admits to daily etoh use. That episode occurred at 3 pm. Since this evening, he has not had any dizziness, however, his daughter wanted him to come to the ED to get checked out, so he came in.      Patient is a 63 y.o. male presenting with near-syncope. The history is provided by the patient.  Near Syncope Pertinent negatives include no chest pain, no abdominal pain and no shortness of breath.    Past Medical History  Diagnosis Date  . HTN (hypertension)   . Bradycardia, sinus 12/24/11  . Syncope and collapse 12/24/11    2/2 hypotension in the setting of dehydration  . Hypotension 12/24/11    in the setting of dehydration   . Abnormal EKG 12/24/11    anteroseptal and lateral ST elevation, felt r/t early repolarization;  Cardiac cath 12/24/11 - normal coronary anatomy, EF 55-65%  . Marijuana use   . History of alcohol abuse     hospitalized for detox 2002   Past Surgical History  Procedure Laterality Date  . Circumcision  1972  . Laceration repair  02/2004    arthroscopic debridement of triagular fibrocartilage tear/E-chart; right wrist  . Cardiac catheterization  12/24/11    normal coronary anatomy, EF 55-65%   No  family history on file. History  Substance Use Topics  . Smoking status: Never Smoker   . Smokeless tobacco: Never Used  . Alcohol Use: Yes     Comment: 12/24/11 "last alcohol was 2000"    Review of Systems  Constitutional: Negative for activity change and appetite change.  Respiratory: Negative for cough and shortness of breath.   Cardiovascular: Positive for near-syncope. Negative for chest pain.  Gastrointestinal: Negative for abdominal pain.  Genitourinary: Negative for dysuria.  Neurological: Positive for dizziness.    Allergies  Review of patient's allergies indicates no known allergies.  Home Medications   Current Outpatient Rx  Name  Route  Sig  Dispense  Refill  . Aspirin-Acetaminophen-Caffeine (GOODY HEADACHE PO)   Oral   Take 1 packet by mouth daily as needed. For pain/headache         . fluticasone (FLONASE) 50 MCG/ACT nasal spray   Each Nare   Place 2 sprays into both nostrils daily as needed for allergies or rhinitis.         Marland Kitchen PRESCRIPTION MEDICATION   Oral   Take 0.5 tablets by mouth 2 (two) times daily. High blood pressure pill that starts with a L         . PRESCRIPTION MEDICATION   Oral   Take 1 tablet by mouth at bedtime. Tablet to keep from going to the bathroom as much during the  night time.          BP 131/100  Pulse 55  Temp(Src) 98.4 F (36.9 C) (Oral)  Resp 16  Wt 174 lb (78.926 kg)  SpO2 97% Physical Exam  Nursing note and vitals reviewed. Constitutional: He is oriented to person, place, and time. He appears well-developed.  HENT:  Head: Normocephalic and atraumatic.  Eyes: Conjunctivae and EOM are normal. Pupils are equal, round, and reactive to light.  Neck: Normal range of motion. Neck supple. No JVD present.  Cardiovascular: Normal rate and regular rhythm.   No murmur heard. Pulmonary/Chest: Effort normal and breath sounds normal.  Abdominal: Soft. Bowel sounds are normal. He exhibits no distension. There is no  tenderness. There is no rebound and no guarding.  Musculoskeletal: He exhibits no edema and no tenderness.  Neurological: He is alert and oriented to person, place, and time.  Skin: Skin is warm.    ED Course  Procedures (including critical care time) Labs Review Labs Reviewed  CBC WITH DIFFERENTIAL - Abnormal; Notable for the following:    RBC 4.09 (*)    HCT 38.0 (*)    All other components within normal limits  COMPREHENSIVE METABOLIC PANEL - Abnormal; Notable for the following:    Glucose, Bld 106 (*)    Total Bilirubin 0.2 (*)    GFR calc non Af Amer 73 (*)    GFR calc Af Amer 85 (*)    All other components within normal limits  URINE RAPID DRUG SCREEN (HOSP PERFORMED) - Abnormal; Notable for the following:    Tetrahydrocannabinol POSITIVE (*)    All other components within normal limits  GLUCOSE, CAPILLARY - Abnormal; Notable for the following:    Glucose-Capillary 102 (*)    All other components within normal limits  ETHANOL  URINALYSIS, ROUTINE W REFLEX MICROSCOPIC   Imaging Review No results found.  EKG Interpretation    Date/Time:  Friday September 29 2013 21:27:50 EST Ventricular Rate:  58 PR Interval:  174 QRS Duration: 84 QT Interval:  406 QTC Calculation: 398 R Axis:   45 Text Interpretation:  Sinus bradycardia Otherwise normal ECG Confirmed by RAY MD, DANIELLE ([id number]) on 09/29/2013 9:32:04 PM            MDM   1. Near syncope    DDx includes: Orthostatic hypotension Seizure Stroke Vertebral artery dissection/stenosis Dysrhythmia PE Vasovagal/neurocardiogenic syncope Aortic stenosis Valvular disorder/Cardiomyopathy Anemia  Pt comes in with cc of dizziness, ? Syncope vs seizure.  Unsure what happened during that episode of unresponsiveness this afternoon. It seems like he was incontinent and was noted to be unresponsive - makes me think of possible 1st time seizure, but then he also states that he has been dizzy with getting up today -  which is more consistent with orthostatics hypotension and possible syncope.  SF syncope score is 0. Pt is not on any meds, no drug use. Pt at no point had chest pain, palpitations, dib - ekg is normal here.  DDx is still syncope vs. Seizure. Feel comfortable discharging now - but i stressed the important of PCP f/u for optimal workup. Pt and family informed to return to the ER if there is repeat episode of unresponsiveness immediately.       Varney Biles, MD 09/30/13 [phone]

## 2013-09-30 NOTE — ED Notes (Signed)
edp in speaking with the pt and family

## 2013-10-07 IMAGING — CR DG CHEST 2V
2 series · 2 of 2 positions shown · non-contrast
Comparison: Chest radiograph performed 06/11/2008

CLINICAL DATA: Syncope.

CHEST - 2 VIEW

[w chest pa]
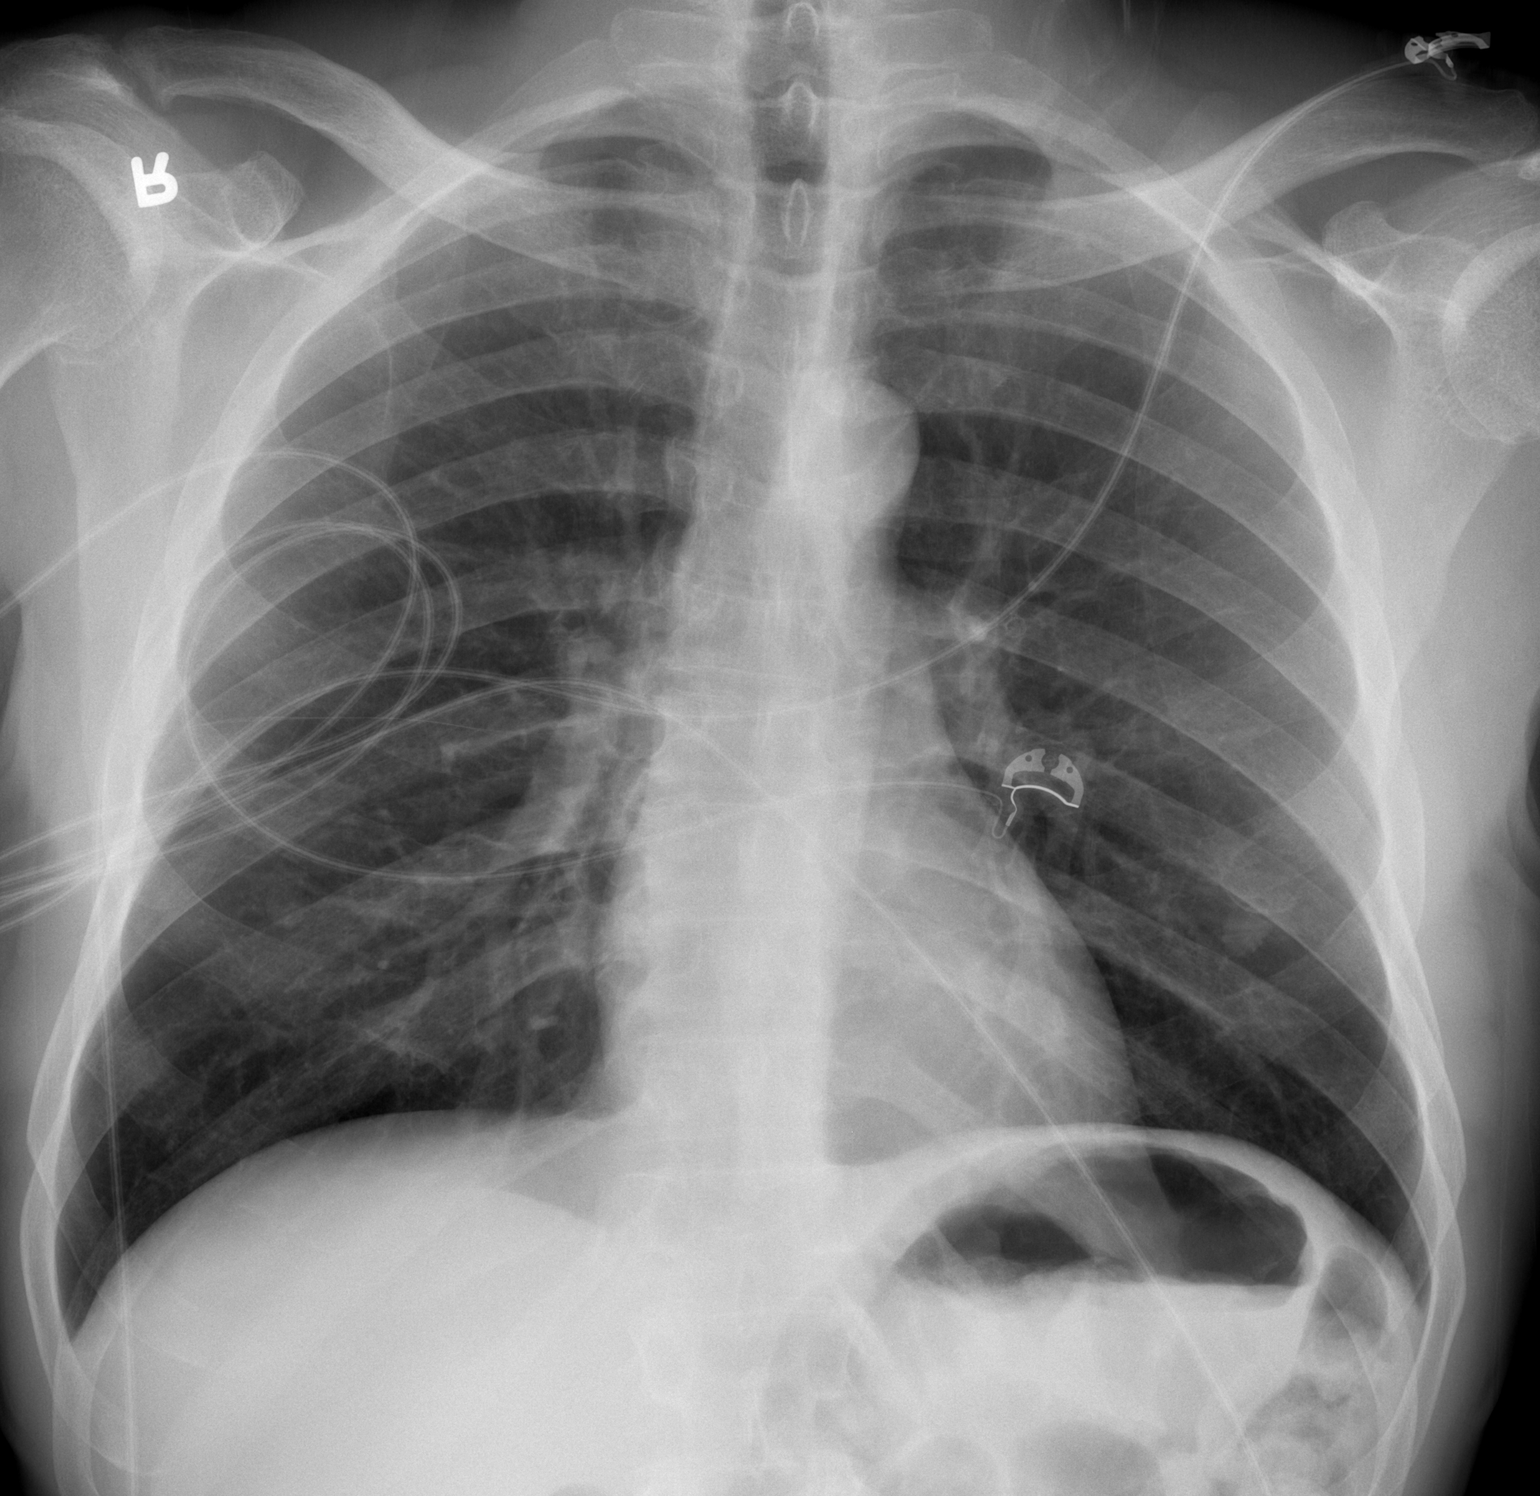

[w chest lat]
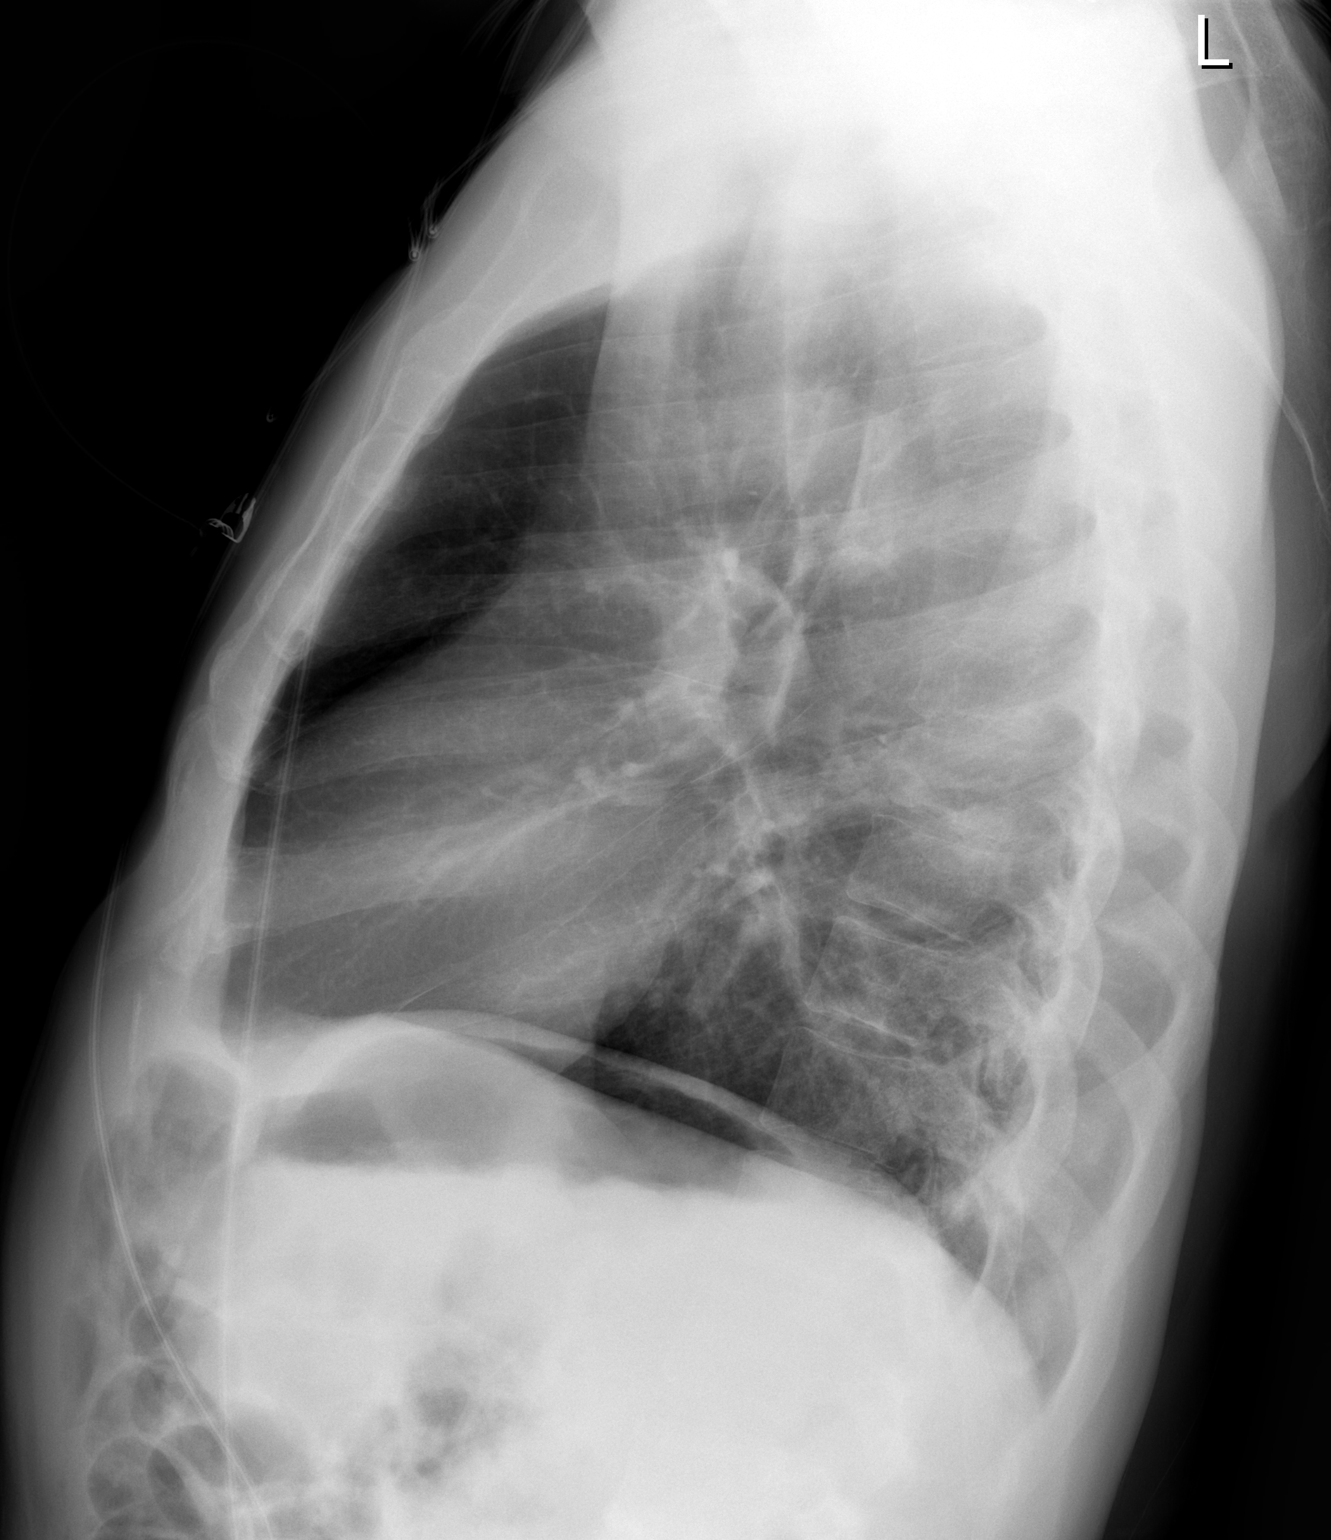

[2 of 2 positions shown; findings below may reference images not displayed]

FINDINGS: The lungs are well-aerated and clear.  There is no
evidence of focal opacification, pleural effusion or pneumothorax.
Bilateral nipple shadows are seen.

The heart is normal in size; the mediastinal contour is within
normal limits.  No acute osseous abnormalities are seen.
IMPRESSION: No acute cardiopulmonary process seen.

## 2014-08-23 ENCOUNTER — Encounter (HOSPITAL_COMMUNITY): Payer: Self-pay | Admitting: Cardiology

## 2014-10-07 ENCOUNTER — Emergency Department (HOSPITAL_COMMUNITY)
Admission: EM | Admit: 2014-10-07 | Discharge: 2014-10-07 | Disposition: A | Discharge status: Home / Self Care | Payer: Non-veteran care | Attending: Emergency Medicine | Admitting: Emergency Medicine

## 2014-10-07 ENCOUNTER — Emergency Department (HOSPITAL_COMMUNITY): Payer: Non-veteran care

## 2014-10-07 ENCOUNTER — Encounter (HOSPITAL_COMMUNITY): Payer: Self-pay | Admitting: *Deleted

## 2014-10-07 DIAGNOSIS — S40022A Contusion of left upper arm, initial encounter: Secondary | ICD-10-CM | POA: Insufficient documentation

## 2014-10-07 DIAGNOSIS — Z79899 Other long term (current) drug therapy: Secondary | ICD-10-CM | POA: Insufficient documentation

## 2014-10-07 DIAGNOSIS — Y9389 Activity, other specified: Secondary | ICD-10-CM | POA: Insufficient documentation

## 2014-10-07 DIAGNOSIS — W1839XA Other fall on same level, initial encounter: Secondary | ICD-10-CM | POA: Insufficient documentation

## 2014-10-07 DIAGNOSIS — Y9289 Other specified places as the place of occurrence of the external cause: Secondary | ICD-10-CM | POA: Insufficient documentation

## 2014-10-07 DIAGNOSIS — Y998 Other external cause status: Secondary | ICD-10-CM | POA: Insufficient documentation

## 2014-10-07 DIAGNOSIS — I10 Essential (primary) hypertension: Secondary | ICD-10-CM | POA: Insufficient documentation

## 2014-10-07 MED ORDER — DIAZEPAM 5 MG PO TABS
5.0000 mg | ORAL_TABLET | Freq: Once | ORAL | Status: AC
  Administered 2014-10-07: 5 mg via ORAL
  Filled 2014-10-07: qty 1

## 2014-10-07 MED ORDER — TRAMADOL HCL 50 MG PO TABS: 50.0000 mg | ORAL_TABLET | Freq: Four times a day (QID) | ORAL | Status: DC | PRN

## 2014-10-07 MED ORDER — OXYCODONE-ACETAMINOPHEN 5-325 MG PO TABS
2.0000 | ORAL_TABLET | Freq: Once | ORAL | Status: AC
  Administered 2014-10-07: 2 via ORAL
  Filled 2014-10-07: qty 2

## 2014-10-07 MED ORDER — OXYCODONE-ACETAMINOPHEN 5-325 MG PO TABS
1.0000 | ORAL_TABLET | Freq: Once | ORAL | Status: AC
  Administered 2014-10-07: 1 via ORAL
  Filled 2014-10-07: qty 1

## 2014-10-07 MED ORDER — IBUPROFEN 600 MG PO TABS: 600.0000 mg | ORAL_TABLET | Freq: Four times a day (QID) | ORAL | Status: DC | PRN

## 2014-10-07 MED ORDER — IBUPROFEN 400 MG PO TABS
600.0000 mg | ORAL_TABLET | Freq: Once | ORAL | Status: AC
  Administered 2014-10-07: 600 mg via ORAL
  Filled 2014-10-07 (×2): qty 1

## 2014-10-07 NOTE — Discharge Instructions (Signed)
Contusion °A contusion is a deep bruise. Contusions are the result of an injury that caused bleeding under the skin. The contusion may turn blue, purple, or yellow. Minor injuries will give you a painless contusion, but more severe contusions may stay painful and swollen for a few weeks.  °CAUSES  °A contusion is usually caused by a blow, trauma, or direct force to an area of the body. °SYMPTOMS  °· Swelling and redness of the injured area. °· Bruising of the injured area. °· Tenderness and soreness of the injured area. °· Pain. °DIAGNOSIS  °The diagnosis can be made by taking a history and physical exam. An X-ray, CT scan, or MRI may be needed to determine if there were any associated injuries, such as fractures. °TREATMENT  °Specific treatment will depend on what area of the body was injured. In general, the best treatment for a contusion is resting, icing, elevating, and applying cold compresses to the injured area. Over-the-counter medicines may also be recommended for pain control. Ask your caregiver what the best treatment is for your contusion. °HOME CARE INSTRUCTIONS  °· Put ice on the injured area. °¨ Put ice in a plastic bag. °¨ Place a towel between your skin and the bag. °¨ Leave the ice on for 15-20 minutes, 3-4 times a day, or as directed by your health care provider. °· Only take over-the-counter or prescription medicines for pain, discomfort, or fever as directed by your caregiver. Your caregiver may recommend avoiding anti-inflammatory medicines (aspirin, ibuprofen, and naproxen) for 48 hours because these medicines may increase bruising. °· Rest the injured area. °· If possible, elevate the injured area to reduce swelling. °SEEK IMMEDIATE MEDICAL CARE IF:  °· You have increased bruising or swelling. °· You have pain that is getting worse. °· Your swelling or pain is not relieved with medicines. °MAKE SURE YOU:  °· Understand these instructions. °· Will watch your condition. °· Will get help right  away if you are not doing well or get worse. °Document Released: 06/10/2005 Document Revised: 09/05/2013 Document Reviewed: 07/06/2011 °Exit[employer]® Patient Information ©2015 [employer]. This information is not intended to replace advice given to you by your health care provider. Make sure you discuss any questions you have with your health care provider. ° °

## 2014-10-07 NOTE — ED Notes (Addendum)
Pt reports getting up this am and falling, having pain to left upper arm and left wrist, pt tearful at triage. +radial pulse.

## 2014-10-15 NOTE — ED Provider Notes (Signed)
CSN: [id number]     Arrival date & time 10/07/14  1428 History   First MD Initiated Contact with Patient 10/07/14 1653     Chief Complaint  Patient presents with  . Fall  . Arm Pain     (Consider location/radiation/quality/duration/timing/severity/associated sxs/prior Treatment) HPI   63yM with LUE pain. Fell this morning onto L side. Lost balance. Pain in l upper arm and l wrist since. Does think ht head. Denies significant HA, neck or back pain. No numbness or tingling. No blood thinners. No intervention prior to arrival.   Past Medical History  Diagnosis Date  . HTN (hypertension)   . Bradycardia, sinus 12/24/11  . Syncope and collapse 12/24/11    2/2 hypotension in the setting of dehydration  . Hypotension 12/24/11    in the setting of dehydration   . Abnormal EKG 12/24/11    anteroseptal and lateral ST elevation, felt r/t early repolarization;  Cardiac cath 12/24/11 - normal coronary anatomy, EF 55-65%  . Marijuana use   . History of alcohol abuse     hospitalized for detox 2002   Past Surgical History  Procedure Laterality Date  . Circumcision  1972  . Laceration repair  02/2004    arthroscopic debridement of triagular fibrocartilage tear/E-chart; right wrist  . Cardiac catheterization  12/24/11    normal coronary anatomy, EF 55-65%  . Left heart catheterization with coronary angiogram N/A 12/24/2011    Procedure: LEFT HEART CATHETERIZATION WITH CORONARY ANGIOGRAM;  Surgeon: Peter M Martinique, MD;  Location: [facility] CATH LAB;  Service: Cardiovascular;  Laterality: N/A;   History reviewed. No pertinent family history. History  Substance Use Topics  . Smoking status: Never Smoker   . Smokeless tobacco: Never Used  . Alcohol Use: Yes     Comment: 12/24/11 "last alcohol was 2000"    Review of Systems  All systems reviewed and negative, other than as noted in HPI.   Allergies  Review of patient's allergies indicates no known allergies.  Home Medications   Prior to Admission  medications   Medication Sig Start Date End Date Taking? Authorizing Provider  acetaminophen (TYLENOL) 500 MG tablet Take 1,000 mg by mouth daily as needed for headache.   Yes Historical Provider, MD  Aspirin-Salicylamide-Caffeine (BC HEADACHE POWDER PO) Take 1 packet by mouth daily as needed (pain).   Yes Historical Provider, MD  PRESCRIPTION MEDICATION Take 10 mg by mouth daily. High blood pressure pill that starts with a L   Yes Historical Provider, MD  PRESCRIPTION MEDICATION Take 5 mg by mouth 2 (two) times daily. Tablet to keep from going to the bathroom as much during the night time.   Yes Historical Provider, MD  ibuprofen (ADVIL,MOTRIN) 600 MG tablet Take 1 tablet (600 mg total) by mouth every 6 (six) hours as needed. 10/07/14   Virgel Manifold, MD  traMADol (ULTRAM) 50 MG tablet Take 1 tablet (50 mg total) by mouth every 6 (six) hours as needed. 10/07/14   Virgel Manifold, MD   BP 145/86 mmHg  Pulse 71  Temp(Src) 97.9 F (36.6 C) (Oral)  Resp 20  SpO2 99% Physical Exam  Constitutional: He appears well-developed and well-nourished. No distress.  HENT:  Head: Normocephalic and atraumatic.  Eyes: Conjunctivae are normal. Right eye exhibits no discharge. Left eye exhibits no discharge.  Neck: Neck supple.  Cardiovascular: Normal rate, regular rhythm and normal heart sounds.  Exam reveals no gallop and no friction rub.   No murmur heard. Pulmonary/Chest: Effort normal and  breath sounds normal. No respiratory distress.  Abdominal: Soft. He exhibits no distension. There is no tenderness.  Musculoskeletal: He exhibits no edema or tenderness.  LUE grossly normal in appearance and symmetric as compared to R. Tenderness primarily in wrist/distal forearm, worse along ulnar aspect. No snuffbox tenderness. Can actively range wrist although with increased pain. NVI distally. FROM at elbow. Moderate tenderness mid/proximal humerus. Can actively range shoulder. NVI distally. No midline spinal  tenderness.   Neurological: He is alert.  Skin: Skin is warm and dry.  Psychiatric: He has a normal mood and affect. His behavior is normal. Thought content normal.  Nursing note and vitals reviewed.   ED Course  Procedures (including critical care time) Labs Review Labs Reviewed - No data to display  Imaging Review No results found.   EKG Interpretation None      MDM   Final diagnoses:  Contusion of left arm, initial encounter    63yM with LUE pain after fall. Closed injury. NVI intact. Imaging neg. PRN pain meds. Return precautions dicussed.     Virgel Manifold, MD 10/15/14 [phone]

## 2014-11-26 ENCOUNTER — Encounter (HOSPITAL_COMMUNITY): Payer: Self-pay

## 2014-11-26 ENCOUNTER — Emergency Department (INDEPENDENT_AMBULATORY_CARE_PROVIDER_SITE_OTHER)
Admission: EM | Admit: 2014-11-26 | Discharge: 2014-11-26 | Disposition: A | Discharge status: Home / Self Care | Payer: Self-pay | Source: Home / Self Care

## 2014-11-26 DIAGNOSIS — M25511 Pain in right shoulder: Secondary | ICD-10-CM

## 2014-11-26 MED ORDER — MELOXICAM 7.5 MG PO TABS: 7.5000 mg | ORAL_TABLET | Freq: Every day | ORAL | Status: DC
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
## 2014-11-26 NOTE — Discharge Instructions (Signed)
Please take medication as directed. Do not use ibuprofen when taking new prescription medication. You may take tylenol if you need additional pain control. I have sent referral to [facility] for Sports Medicine. If this office has not called you for an appointment over the next 5 days, please call the office to set up appointment. Shoulder Pain The shoulder is the joint that connects your arms to your body. The bones that form the shoulder joint include the upper arm bone (humerus), the shoulder blade (scapula), and the collarbone (clavicle). The top of the humerus is shaped like a ball and fits into a rather flat socket on the scapula (glenoid cavity). A combination of muscles and strong, fibrous tissues that connect muscles to bones (tendons) support your shoulder joint and hold the ball in the socket. Small, fluid-filled sacs (bursae) are located in different areas of the joint. They act as cushions between the bones and the overlying soft tissues and help reduce friction between the gliding tendons and the bone as you move your arm. Your shoulder joint allows a wide range of motion in your arm. This range of motion allows you to do things like scratch your back or throw a ball. However, this range of motion also makes your shoulder more prone to pain from overuse and injury. Causes of shoulder pain can originate from both injury and overuse and usually can be grouped in the following four categories:  Redness, swelling, and pain (inflammation) of the tendon (tendinitis) or the bursae (bursitis).  Instability, such as a dislocation of the joint.  Inflammation of the joint (arthritis).  Broken bone (fracture). HOME CARE INSTRUCTIONS   Apply ice to the sore area.  Put ice in a plastic bag.  Place a towel between your skin and the bag.  Leave the ice on for 15-20 minutes, 3-4 times per day for the first 2 days, or as directed by your health care provider.  Stop using cold packs if they  do not help with the pain.  If you have a shoulder sling or immobilizer, wear it as long as your caregiver instructs. Only remove it to shower or bathe. Move your arm as little as possible, but keep your hand moving to prevent swelling.  Squeeze a soft ball or foam pad as much as possible to help prevent swelling.  Only take over-the-counter or prescription medicines for pain, discomfort, or fever as directed by your caregiver. SEEK MEDICAL CARE IF:   Your shoulder pain increases, or new pain develops in your arm, hand, or fingers.  Your hand or fingers become cold and numb.  Your pain is not relieved with medicines. SEEK IMMEDIATE MEDICAL CARE IF:   Your arm, hand, or fingers are numb or tingling.  Your arm, hand, or fingers are significantly swollen or turn white or blue. MAKE SURE YOU:   Understand these instructions.  Will watch your condition.  Will get help right away if you are not doing well or get worse. Document Released: 06/10/2005 Document Revised: 01/15/2014 Document Reviewed: 08/15/2011 [facility] Patient Information 2015 [place], [place]. This information is not intended to replace advice given to you by your health care provider. Make sure you discuss any questions you have with your health care provider.  Shoulder Range of Motion Exercises The shoulder is the most flexible joint in the human body. Because of this it is also the most unstable joint in the body. All ages can develop shoulder problems. Early treatment of problems is necessary  for a good outcome. People react to shoulder pain by decreasing the movement of the joint. After a brief period of time, the shoulder can become "frozen". This is an almost complete loss of the ability to move the damaged shoulder. Following injuries your caregivers can give you instructions on exercises to keep your range of motion (ability to move your shoulder freely), or regain it if it has been lost.  [place]: Codman's Exercise or Pendulum Exercise  This exercise may be performed in a prone (face-down) lying position or standing while leaning on a chair with the opposite arm. Its purpose is to relax the muscles in your shoulder and slowly but surely increase the range of motion and to relieve pain.  Lie on your stomach close to the side edge of the bed. Let your weak arm hang over the edge of the bed. Relax your shoulder, arm and hand. Let your shoulder blade relax and drop down.  Slowly and gently swing your arm forward and back. Do not use your neck muscles; relax them. It might be easier to have someone else gently start swinging your arm.  As pain decreases, increase your swing. To start, arm swing should begin at 15 degree angles. In time and as pain lessens, move to 30-45 degree angles. Start with swinging for about 15 seconds, and work towards swinging for 3 to 5 minutes.  This exercise may also be performed in a standing/bent over position.  Stand and hold onto a sturdy chair with your good arm. Bend forward at the waist and bend your knees slightly to help protect your back. Relax your weak arm, let it hang limp. Relax your shoulder blade and let it drop.  Keep your shoulder relaxed and use body motion to swing your arm in small circles.  Stand up tall and relax.  Repeat motion and change direction of circles.  Start with swinging for about 30 seconds, and work towards swinging for 3 to 5 minutes. STRETCHING EXERCISES:  Lift your arm out in front of you with the elbow bent at 90 degrees. Using your other arm gently pull the elbow forward and across your body.  Bend one arm behind you with the palm facing outward. Using the other arm, hold a towel or rope and reach this arm up above your head, then bend it at the elbow to move your wrist to behind your neck. Grab the free end of the towel with the hand behind your back. Gently pull the towel up with  the hand behind your neck, gradually increasing the pull on the hand behind the small of your back. Then, gradually pull down with the hand behind the small of your back. This will pull the hand and arm behind your neck further. Both shoulders will have an increased range of motion with repetition of this exercise. STRENGTHENING EXERCISES:  Standing with your arm at your side and straight out from your shoulder with the elbow bent at 90 degrees, hold onto a small weight and slowly raise your hand so it points straight up in the air. Repeat this five times to begin with, and gradually increase to ten times. Do this four times per day. As you grow stronger you can gradually increase the weight.  Repeat the above exercise, only this time using an elastic band. Start with your hand up in the air and pull down until your hand is by your side. As you grow  stronger, gradually increase the amount you pull by increasing the number or size of the elastic bands. Use the same amount of repetitions.  Standing with your hand at your side and holding onto a weight, gradually lift the hand in front of you until it is over your head. Do the same also with the hand remaining at your side and lift the hand away from your body until it is again over your head. Repeat this five times to begin with, and gradually increase to ten times. Do this four times per day. As you grow stronger you can gradually increase the weight. Document Released: 05/30/2003 Document Revised: 09/05/2013 Document Reviewed: 08/31/2005 [facility] Patient Information 2015 [place], [place]. This information is not intended to replace advice given to you by your health care provider. Make sure you discuss any questions you have with your health care provider.

## 2015-03-05 ENCOUNTER — Emergency Department (HOSPITAL_COMMUNITY): Payer: Non-veteran care

## 2015-03-05 ENCOUNTER — Emergency Department (HOSPITAL_COMMUNITY)
Admission: EM | Admit: 2015-03-05 | Discharge: 2015-03-05 | Disposition: A | Discharge status: Home / Self Care | Payer: Non-veteran care | Attending: Emergency Medicine | Admitting: Emergency Medicine

## 2015-03-05 ENCOUNTER — Encounter (HOSPITAL_COMMUNITY): Payer: Self-pay | Admitting: Emergency Medicine

## 2015-03-05 DIAGNOSIS — Z791 Long term (current) use of non-steroidal anti-inflammatories (NSAID): Secondary | ICD-10-CM | POA: Diagnosis not present

## 2015-03-05 DIAGNOSIS — J069 Acute upper respiratory infection, unspecified: Secondary | ICD-10-CM | POA: Diagnosis not present

## 2015-03-05 DIAGNOSIS — I10 Essential (primary) hypertension: Secondary | ICD-10-CM | POA: Diagnosis not present

## 2015-03-05 DIAGNOSIS — R0602 Shortness of breath: Secondary | ICD-10-CM | POA: Diagnosis present

## 2015-03-05 DIAGNOSIS — J9801 Acute bronchospasm: Secondary | ICD-10-CM | POA: Diagnosis not present

## 2015-03-05 LAB — CBC
HEMATOCRIT: 37.5 % — AB (ref 39.0–52.0)
HEMOGLOBIN: 13.2 g/dL (ref 13.0–17.0)
MCH: 32.8 pg (ref 26.0–34.0)
MCHC: 35.2 g/dL (ref 30.0–36.0)
MCV: 93.1 fL (ref 78.0–100.0)
Platelets: 253 10*3/uL (ref 150–400)
RBC: 4.03 MIL/uL — AB (ref 4.22–5.81)
RDW: 12.6 % (ref 11.5–15.5)
WBC: 7 10*3/uL (ref 4.0–10.5)

## 2015-03-05 LAB — I-STAT TROPONIN, ED: TROPONIN I, POC: 0.01 ng/mL (ref 0.00–0.08)

## 2015-03-05 LAB — BASIC METABOLIC PANEL
Anion gap: 14 (ref 5–15)
BUN: 15 mg/dL (ref 6–20)
CALCIUM: 8.8 mg/dL — AB (ref 8.9–10.3)
CO2: 18 mmol/L — ABNORMAL LOW (ref 22–32)
Chloride: 106 mmol/L (ref 101–111)
Creatinine, Ser: 1.03 mg/dL (ref 0.61–1.24)
GFR calc Af Amer: >60 mL/min (ref >60)
GFR calc non Af Amer: >60 mL/min (ref >60)
GLUCOSE: 100 mg/dL — AB (ref 65–99)
POTASSIUM: 3.7 mmol/L (ref 3.5–5.1)
Sodium: 138 mmol/L (ref 135–145)

## 2015-03-05 MED ORDER — ALBUTEROL SULFATE HFA 108 (90 BASE) MCG/ACT IN AERS
2.0000 | INHALATION_SPRAY | RESPIRATORY_TRACT | Status: DC | PRN
  Administered 2015-03-05: 2 via RESPIRATORY_TRACT
  Filled 2015-03-05: qty 6.7

## 2015-03-05 MED ORDER — AMOXICILLIN 500 MG PO CAPS
1000.0000 mg | ORAL_CAPSULE | Freq: Once | ORAL | Status: AC
  Administered 2015-03-05: 1000 mg via ORAL
  Filled 2015-03-05: qty 2

## 2015-03-05 MED ORDER — AMOXICILLIN 500 MG PO CAPS: 1000.0000 mg | ORAL_CAPSULE | Freq: Two times a day (BID) | ORAL | Status: DC

## 2015-03-05 MED ORDER — IPRATROPIUM-ALBUTEROL 0.5-2.5 (3) MG/3ML IN SOLN
3.0000 mL | Freq: Once | RESPIRATORY_TRACT | Status: AC
  Administered 2015-03-05: 3 mL via RESPIRATORY_TRACT
  Filled 2015-03-05: qty 3

## 2015-03-05 MED ORDER — HYDROCOD POLST-CPM POLST ER 10-8 MG/5ML PO SUER: 5.0000 mL | Freq: Every evening | ORAL | Status: DC | PRN

## 2015-03-05 NOTE — ED Notes (Signed)
Pt c/o SOB x2 days with non productive cough. Denies pain, fever/chills, N/V.

## 2015-03-05 NOTE — ED Provider Notes (Signed)
CSN: [id number]     Arrival date & time 03/05/15  0027 History   First MD Initiated Contact with Patient 03/05/15 [phone]     Chief Complaint  Patient presents with  . Shortness of Breath     (Consider location/radiation/quality/duration/timing/severity/associated sxs/prior Treatment) Patient is a 64 y.o. male presenting with shortness of breath. The history is provided by the patient. No language interpreter was used.  Shortness of Breath Associated symptoms: cough   Associated symptoms: no abdominal pain, no fever and no vomiting   Associated symptoms comment:  He complains of cough worsening over the last 3 days without know fever. No vomiting. He denies fever but feels chilled and has intermittent sweats. He is a nonsmoker. He coughs to the point where he feels short of breath, worse when he lies down. No pain.    Past Medical History  Diagnosis Date  . HTN (hypertension)   . Bradycardia, sinus 12/24/11  . Syncope and collapse 12/24/11    2/2 hypotension in the setting of dehydration  . Hypotension 12/24/11    in the setting of dehydration   . Abnormal EKG 12/24/11    anteroseptal and lateral ST elevation, felt r/t early repolarization;  Cardiac cath 12/24/11 - normal coronary anatomy, EF 55-65%  . Marijuana use   . History of alcohol abuse     hospitalized for detox 2002   Past Surgical History  Procedure Laterality Date  . Circumcision  1972  . Laceration repair  02/2004    arthroscopic debridement of triagular fibrocartilage tear/E-chart; right wrist  . Cardiac catheterization  12/24/11    normal coronary anatomy, EF 55-65%  . Left heart catheterization with coronary angiogram N/A 12/24/2011    Procedure: LEFT HEART CATHETERIZATION WITH CORONARY ANGIOGRAM;  Surgeon: Peter M Martinique, MD;  Location[facility] C[facility] LAB;  Service: Cardiovascular;  Laterality: N/A;   No family history on file. History  Substance Use Topics  . Smoking status: Never Smoker   . Smokeless tobacco: Never Used   . Alcohol Use: Yes     Comment: 12/24/11 "last alcohol was 2000"    Review of Systems  Constitutional: Negative for fever and chills.  HENT: Positive for congestion.   Respiratory: Positive for cough and shortness of breath.   Cardiovascular: Negative.   Gastrointestinal: Negative.  Negative for nausea, vomiting and abdominal pain.  Musculoskeletal: Negative.  Negative for neck stiffness.  Skin: Negative.   Neurological: Negative.       Allergies  Review of patient's allergies indicates no known allergies.  Home Medications   Prior to Admission medications   Medication Sig Start Date End Date Taking? Authorizing Provider  acetaminophen (TYLENOL) 500 MG tablet Take 1,000 mg by mouth daily as needed for headache.    Historical Provider, MD  Aspirin-Salicylamide-Caffeine (BC HEADACHE POWDER PO) Take 1 packet by mouth daily as needed (pain).    Historical Provider, MD  ibuprofen (ADVIL,MOTRIN) 600 MG tablet Take 1 tablet (600 mg total) by mouth every 6 (six) hours as needed. 10/07/14   Virgel Manifold, MD  meloxicam (MOBIC) 7.5 MG tablet Take 1 tablet (7.5 mg total) by mouth daily. 11/26/14   Lutricia Feil, PA  PRESCRIPTION MEDICATION Take 10 mg by mouth daily. High blood pressure pill that starts with a L    Historical Provider, MD  PRESCRIPTION MEDICATION Take 5 mg by mouth 2 (two) times daily. Tablet to keep from going to the bathroom as much during the night time.    Historical  Provider, MD  traMADol (ULTRAM) 50 MG tablet Take 1 tablet (50 mg total) by mouth every 6 (six) hours as needed. 10/07/14   Virgel Manifold, MD   BP 141/90 mmHg  Pulse 82  Temp(Src) 97.8 F (36.6 C) (Oral)  Resp 26  SpO2 98% Physical Exam  Constitutional: He is oriented to person, place, and time. He appears well-developed and well-nourished.  HENT:  Head: Normocephalic.  Eyes: Conjunctivae are normal.  Neck: Normal range of motion. Neck supple.  Cardiovascular: Normal rate and regular rhythm.    Pulmonary/Chest: Effort normal and breath sounds normal. He has no wheezes. He has no rales.  Examined after HHN with Albuterol.   Abdominal: Soft. Bowel sounds are normal. There is no tenderness. There is no rebound and no guarding.  Musculoskeletal: Normal range of motion.  Neurological: He is alert and oriented to person, place, and time.  Skin: Skin is warm and dry. No rash noted.  Psychiatric: He has a normal mood and affect.    ED Course  Procedures (including critical care time) Labs Review Labs Reviewed  BASIC METABOLIC PANEL - Abnormal; Notable for the following:    CO2 18 (*)    Glucose, Bld 100 (*)    Calcium 8.8 (*)    All other components within normal limits  CBC - Abnormal; Notable for the following:    RBC 4.03 (*)    HCT 37.5 (*)    All other components within normal limits  I-STAT TROPOININ, ED    Imaging Review Dg Chest 2 View (if Patient Has Fever And/or Copd)  03/05/2015   CLINICAL DATA:  Patient with shortness of breath and nonproductive cough for 2 days.  EXAM: CHEST  2 VIEW  COMPARISON:  Chest radiograph 12/24/2011  FINDINGS: Monitoring leads overlie the patient. Stable cardiac and mediastinal contours. No consolidative pulmonary opacities. No pleural effusion or pneumothorax. Mid thoracic spine degenerative changes. Probable nipple shadow over the left lower lung versus stable nodule dating back 2009.  IMPRESSION: No acute cardiopulmonary process.   Electronically Signed   By: Lovey Newcomer M.D.   On: 03/05/2015 02:24     EKG Interpretation   Date/Time:  Tuesday March 05 2015 00:35:26 EDT Ventricular Rate:  89 PR Interval:  159 QRS Duration: 87 QT Interval:  358 QTC Calculation: 436 R Axis:   73 Text Interpretation:  Sinus rhythm Right atrial enlargement Anteroseptal  infarct, old ST elevation, consider inferior injury Rate is faster; T-wave  inversion lead III no longer present Artifact Confirmed by MOLPUS  MD,  Jenny Reichmann ([id number]) on 03/05/2015 2:39:34  AM      MDM   Final diagnoses:  None    1. URI  Patient felt better after breathing treatment, less coughing. No evidence of pneumonia, however, actively coughing on exam with bronchospasm. Will cover with abx, albuterol inhaler and PCP follow up this week.     Charlann Lange, PA-C 03/05/15 0309  Shanon Rosser, MD 03/05/15 [phone]

## 2015-03-05 NOTE — Discharge Instructions (Signed)
Bronchospasm A bronchospasm is a spasm or tightening of the airways going into the lungs. During a bronchospasm breathing becomes more difficult because the airways get smaller. When this happens there can be coughing, a whistling sound when breathing (wheezing), and difficulty breathing. Bronchospasm is often associated with asthma, but not all patients who experience a bronchospasm have asthma. CAUSES  A bronchospasm is caused by inflammation or irritation of the airways. The inflammation or irritation may be triggered by:   Allergies (such as to animals, pollen, food, or mold). Allergens that cause bronchospasm may cause wheezing immediately after exposure or many hours later.   Infection. Viral infections are believed to be the most common cause of bronchospasm.   Exercise.   Irritants (such as pollution, cigarette smoke, strong odors, aerosol sprays, and paint fumes).   Weather changes. Winds increase molds and pollens in the air. Rain refreshes the air by washing irritants out. Cold air may cause inflammation.   Stress and emotional upset.  SIGNS AND SYMPTOMS   Wheezing.   Excessive nighttime coughing.   Frequent or severe coughing with a simple cold.   Chest tightness.   Shortness of breath.  DIAGNOSIS  Bronchospasm is usually diagnosed through a history and physical exam. Tests, such as chest X-rays, are sometimes done to look for other conditions. TREATMENT   Inhaled medicines can be given to open up your airways and help you breathe. The medicines can be given using either an inhaler or a nebulizer machine.  Corticosteroid medicines may be given for severe bronchospasm, usually when it is associated with asthma. HOME CARE INSTRUCTIONS   Always have a plan prepared for seeking medical care. Know when to call your health care provider and local emergency services (911 in the [place] Know where you can access local emergency care.  Only take medicines as  directed by your health care provider.  If you were prescribed an inhaler or nebulizer machine, ask your health care provider to explain how to use it correctly. Always use a spacer with your inhaler if you were given one.  It is necessary to remain calm during an attack. Try to relax and breathe more slowly.  Control your home environment in the following ways:   Change your heating and air conditioning filter at least once a month.   Limit your use of fireplaces and wood stoves.  Do not smoke and do not allow smoking in your home.   Avoid exposure to perfumes and fragrances.   Get rid of pests (such as roaches and mice) and their droppings.   Throw away plants if you see mold on them.   Keep your house clean and dust free.   Replace carpet with wood, tile, or vinyl flooring. Carpet can trap dander and dust.   Use allergy-proof pillows, mattress covers, and box spring covers.   Wash bed sheets and blankets every week in hot water and dry them in a dryer.   Use blankets that are made of polyester or cotton.   Wash hands frequently. SEEK MEDICAL CARE IF:   You have muscle aches.   You have chest pain.   The sputum changes from clear or white to yellow, green, gray, or bloody.   The sputum you cough up gets thicker.   There are problems that may be related to the medicine you are given, such as a rash, itching, swelling, or trouble breathing.  SEEK IMMEDIATE MEDICAL CARE IF:   You have worsening wheezing and coughing even  after taking your prescribed medicines.   You have increased difficulty breathing.   You develop severe chest pain. MAKE SURE YOU:   Understand these instructions.  Will watch your condition.  Will get help right away if you are not doing well or get worse. Document Released: 09/03/2003 Document Revised: 09/05/2013 Document Reviewed: 02/20/2013 [facility] Patient Information 2015 [place], [place]. This information is not  intended to replace advice given to you by your health care provider. Make sure you discuss any questions you have with your health care provider. Upper Respiratory Infection, Adult An upper respiratory infection (URI) is also sometimes known as the common cold. The upper respiratory tract includes the nose, sinuses, throat, trachea, and bronchi. Bronchi are the airways leading to the lungs. Most people improve within 1 week, but symptoms can last up to 2 weeks. A residual cough may last even longer.  CAUSES Many different viruses can infect the tissues lining the upper respiratory tract. The tissues become irritated and inflamed and often become very moist. Mucus production is also common. A cold is contagious. You can easily spread the virus to others by oral contact. This includes kissing, sharing a glass, coughing, or sneezing. Touching your mouth or nose and then touching a surface, which is then touched by another person, can also spread the virus. SYMPTOMS  Symptoms typically develop 1 to 3 days after you come in contact with a cold virus. Symptoms vary from person to person. They may include:  Runny nose.  Sneezing.  Nasal congestion.  Sinus irritation.  Sore throat.  Loss of voice (laryngitis).  Cough.  Fatigue.  Muscle aches.  Loss of appetite.  Headache.  Low-grade fever. DIAGNOSIS  You might diagnose your own cold based on familiar symptoms, since most people get a cold 2 to 3 times a year. Your caregiver can confirm this based on your exam. Most importantly, your caregiver can check that your symptoms are not due to another disease such as strep throat, sinusitis, pneumonia, asthma, or epiglottitis. Blood tests, throat tests, and X-rays are not necessary to diagnose a common cold, but they may sometimes be helpful in excluding other more serious diseases. Your caregiver will decide if any further tests are required. RISKS AND COMPLICATIONS  You may be at risk for a more  severe case of the common cold if you smoke cigarettes, have chronic heart disease (such as heart failure) or lung disease (such as asthma), or if you have a weakened immune system. The very young and very old are also at risk for more serious infections. Bacterial sinusitis, middle ear infections, and bacterial pneumonia can complicate the common cold. The common cold can worsen asthma and chronic obstructive pulmonary disease (COPD). Sometimes, these complications can require emergency medical care and may be life-threatening. PREVENTION  The best way to protect against getting a cold is to practice good hygiene. Avoid oral or hand contact with people with cold symptoms. Wash your hands often if contact occurs. There is no clear evidence that vitamin C, vitamin E, echinacea, or exercise reduces the chance of developing a cold. However, it is always recommended to get plenty of rest and practice good nutrition. TREATMENT  Treatment is directed at relieving symptoms. There is no cure. Antibiotics are not effective, because the infection is caused by a virus, not by bacteria. Treatment may include:  Increased fluid intake. Sports drinks offer valuable electrolytes, sugars, and fluids.  Breathing heated mist or steam (vaporizer or shower).  Eating chicken soup or  other clear broths, and maintaining good nutrition.  Getting plenty of rest.  Using gargles or lozenges for comfort.  Controlling fevers with ibuprofen or acetaminophen as directed by your caregiver.  Increasing usage of your inhaler if you have asthma. Zinc gel and zinc lozenges, taken in the first 24 hours of the common cold, can shorten the duration and lessen the severity of symptoms. Pain medicines may help with fever, muscle aches, and throat pain. A variety of non-prescription medicines are available to treat congestion and runny nose. Your caregiver can make recommendations and may suggest nasal or lung inhalers for other symptoms.    HOME CARE INSTRUCTIONS   Only take over-the-counter or prescription medicines for pain, discomfort, or fever as directed by your caregiver.  Use a warm mist humidifier or inhale steam from a shower to increase air moisture. This may keep secretions moist and make it easier to breathe.  Drink enough water and fluids to keep your urine clear or pale yellow.  Rest as needed.  Return to work when your temperature has returned to normal or as your caregiver advises. You may need to stay home longer to avoid infecting others. You can also use a face mask and careful hand washing to prevent spread of the virus. SEEK MEDICAL CARE IF:   After the first few days, you feel you are getting worse rather than better.  You need your caregiver's advice about medicines to control symptoms.  You develop chills, worsening shortness of breath, or brown or red sputum. These may be signs of pneumonia.  You develop yellow or brown nasal discharge or pain in the face, especially when you bend forward. These may be signs of sinusitis.  You develop a fever, swollen neck glands, pain with swallowing, or white areas in the back of your throat. These may be signs of strep throat. SEEK IMMEDIATE MEDICAL CARE IF:   You have a fever.  You develop severe or persistent headache, ear pain, sinus pain, or chest pain.  You develop wheezing, a prolonged cough, cough up blood, or have a change in your usual mucus (if you have chronic lung disease).  You develop sore muscles or a stiff neck. Document Released: 02/24/2001 Document Revised: 11/23/2011 Document Reviewed: 12/06/2013 [facility] Patient Information 2015 [place], [place]. This information is not intended to replace advice given to you by your health care provider. Make sure you discuss any questions you have with your health care provider.

## 2015-03-11 ENCOUNTER — Emergency Department (HOSPITAL_COMMUNITY): Payer: Non-veteran care

## 2015-03-11 ENCOUNTER — Encounter (HOSPITAL_COMMUNITY): Payer: Self-pay

## 2015-03-11 ENCOUNTER — Emergency Department (HOSPITAL_COMMUNITY)
Admission: EM | Admit: 2015-03-11 | Discharge: 2015-03-12 | Disposition: A | Discharge status: Home / Self Care | Payer: Non-veteran care | Attending: Emergency Medicine | Admitting: Emergency Medicine

## 2015-03-11 DIAGNOSIS — J209 Acute bronchitis, unspecified: Secondary | ICD-10-CM

## 2015-03-11 DIAGNOSIS — I10 Essential (primary) hypertension: Secondary | ICD-10-CM | POA: Insufficient documentation

## 2015-03-11 DIAGNOSIS — J40 Bronchitis, not specified as acute or chronic: Secondary | ICD-10-CM | POA: Insufficient documentation

## 2015-03-11 DIAGNOSIS — Z9889 Other specified postprocedural states: Secondary | ICD-10-CM | POA: Insufficient documentation

## 2015-03-11 DIAGNOSIS — Z79899 Other long term (current) drug therapy: Secondary | ICD-10-CM | POA: Insufficient documentation

## 2015-03-11 DIAGNOSIS — J9801 Acute bronchospasm: Secondary | ICD-10-CM | POA: Diagnosis not present

## 2015-03-11 DIAGNOSIS — R0602 Shortness of breath: Secondary | ICD-10-CM | POA: Diagnosis present

## 2015-03-11 LAB — CBC WITH DIFFERENTIAL/PLATELET
BASOS PCT: 0 % (ref 0–1)
Basophils Absolute: 0 10*3/uL (ref 0.0–0.1)
Eosinophils Absolute: 1.9 10*3/uL — ABNORMAL HIGH (ref 0.0–0.7)
Eosinophils Relative: 21 % — ABNORMAL HIGH (ref 0–5)
HCT: 40.7 % (ref 39.0–52.0)
Hemoglobin: 14.3 g/dL (ref 13.0–17.0)
Lymphocytes Relative: 17 % (ref 12–46)
Lymphs Abs: 1.5 10*3/uL (ref 0.7–4.0)
MCH: 32.4 pg (ref 26.0–34.0)
MCHC: 35.1 g/dL (ref 30.0–36.0)
MCV: 92.3 fL (ref 78.0–100.0)
MONOS PCT: 9 % (ref 3–12)
Monocytes Absolute: 0.8 10*3/uL (ref 0.1–1.0)
Neutro Abs: 4.8 10*3/uL (ref 1.7–7.7)
Neutrophils Relative %: 53 % (ref 43–77)
PLATELETS: 349 10*3/uL (ref 150–400)
RBC: 4.41 MIL/uL (ref 4.22–5.81)
RDW: 12.5 % (ref 11.5–15.5)
WBC: 9 10*3/uL (ref 4.0–10.5)

## 2015-03-11 LAB — BASIC METABOLIC PANEL
Anion gap: 10 (ref 5–15)
BUN: 11 mg/dL (ref 6–20)
CALCIUM: 9.3 mg/dL (ref 8.9–10.3)
CHLORIDE: 102 mmol/L (ref 101–111)
CO2: 22 mmol/L (ref 22–32)
Creatinine, Ser: 0.9 mg/dL (ref 0.61–1.24)
GFR calc Af Amer: >60 mL/min (ref >60)
GFR calc non Af Amer: >60 mL/min (ref >60)
GLUCOSE: 101 mg/dL — AB (ref 65–99)
Potassium: 4 mmol/L (ref 3.5–5.1)
Sodium: 134 mmol/L — ABNORMAL LOW (ref 135–145)

## 2015-03-11 MED ORDER — IPRATROPIUM BROMIDE 0.02 % IN SOLN
0.5000 mg | Freq: Once | RESPIRATORY_TRACT | Status: AC
  Administered 2015-03-11: 0.5 mg via RESPIRATORY_TRACT
  Filled 2015-03-11: qty 2.5

## 2015-03-11 MED ORDER — ALBUTEROL SULFATE (2.5 MG/3ML) 0.083% IN NEBU
5.0000 mg | INHALATION_SOLUTION | Freq: Once | RESPIRATORY_TRACT | Status: AC
  Administered 2015-03-11: 5 mg via RESPIRATORY_TRACT
  Filled 2015-03-11: qty 6

## 2015-03-11 NOTE — ED Notes (Signed)
MD at bedside. 

## 2015-03-11 NOTE — ED Notes (Signed)
Pt also complains of pain and pressure over his right eye

## 2015-03-11 NOTE — Progress Notes (Signed)
EDCM spoke to patient at bedside.  Patient confirms his pcp is located at the [facility] medical [facility] in [place].  System updated.

## 2015-03-11 NOTE — ED Notes (Signed)
Pt complains of being short of breath and a cough for three weeks, he was told his blood pressure was elevated but normal in triage

## 2015-03-11 NOTE — ED Provider Notes (Signed)
CSN: [id number]     Arrival date & time 03/11/15  2120 History   First MD Initiated Contact with Patient 03/11/15 2257     Chief Complaint  Patient presents with  . Shortness of Breath     (Consider location/radiation/quality/duration/timing/severity/associated sxs/prior Treatment) HPI This is a 64 year old male who was seen here on the 21st of this month with a three-day history of cough. He was given an albuterol inhaler and placed on amoxicillin. Despite this his cough and shortness of breath have worsened. Symptoms are now moderate to severe at their worst. It is especially bad when he lies supine or with activity. He denies chest pain for cough has been dry. He denies nausea, vomiting or diarrhea. He was noted to have a low-grade fever on arrival. He he has not been getting adequate relief from his inhaler which made be due to poor technique; he was not given an AeroChamber.  Past Medical History  Diagnosis Date  . HTN (hypertension)   . Bradycardia, sinus 12/24/11  . Syncope and collapse 12/24/11    2/2 hypotension in the setting of dehydration  . Hypotension 12/24/11    in the setting of dehydration   . Abnormal EKG 12/24/11    anteroseptal and lateral ST elevation, felt r/t early repolarization;  Cardiac cath 12/24/11 - normal coronary anatomy, EF 55-65%  . Marijuana use   . History of alcohol abuse     hospitalized for detox 2002   Past Surgical History  Procedure Laterality Date  . Circumcision  1972  . Laceration repair  02/2004    arthroscopic debridement of triagular fibrocartilage tear/E-chart; right wrist  . Cardiac catheterization  12/24/11    normal coronary anatomy, EF 55-65%  . Left heart catheterization with coronary angiogram N/A 12/24/2011    Procedure: LEFT HEART CATHETERIZATION WITH CORONARY ANGIOGRAM;  Surgeon: Peter M Martinique, MD;  Location: [facility] CATH LAB;  Service: Cardiovascular;  Laterality: N/A;   History reviewed. No pertinent family history. History   Substance Use Topics  . Smoking status: Never Smoker   . Smokeless tobacco: Never Used  . Alcohol Use: Yes     Comment: 12/24/11 "last alcohol was 2000"    Review of Systems  All other systems reviewed and are negative.   Allergies  Review of patient's allergies indicates no known allergies.  Home Medications   Prior to Admission medications   Medication Sig Start Date End Date Taking? Authorizing Provider  albuterol (PROVENTIL HFA;VENTOLIN HFA) 108 (90 BASE) MCG/ACT inhaler Inhale 1-2 puffs into the lungs every 6 (six) hours as needed for wheezing or shortness of breath.   Yes Historical Provider, MD  amoxicillin (AMOXIL) 500 MG capsule Take 2 capsules (1,000 mg total) by mouth 2 (two) times daily. 03/05/15  Yes Shari Upstill, PA-C  Aspirin-Acetaminophen-Caffeine (GOODY HEADACHE PO) Take 1 packet by mouth every 6 (six) hours as needed (for pain.).   Yes Historical Provider, MD  chlorpheniramine-HYDROcodone (TUSSIONEX PENNKINETIC ER) 10-8 MG/5ML SUER Take 5 mLs by mouth at bedtime as needed for cough. 03/05/15  Yes Shari Upstill, PA-C  ibuprofen (ADVIL,MOTRIN) 600 MG tablet Take 1 tablet (600 mg total) by mouth every 6 (six) hours as needed. Patient taking differently: Take 600 mg by mouth every 6 (six) hours as needed (for pain.).  10/07/14  Yes Virgel Manifold, MD  lisinopril-hydrochlorothiazide (PRINZIDE,ZESTORETIC) 10-12.5 MG per tablet Take 1 tablet by mouth daily.   Yes Historical Provider, MD  meloxicam (MOBIC) 7.5 MG tablet Take 1 tablet (7.5 mg  total) by mouth daily. Patient not taking: Reported on 03/11/2015 11/26/14   Lutricia Feil, PA  traMADol (ULTRAM) 50 MG tablet Take 1 tablet (50 mg total) by mouth every 6 (six) hours as needed. Patient not taking: Reported on 03/11/2015 10/07/14   Virgel Manifold, MD   BP 149/78 mmHg  Pulse 103  Temp(Src) 98.5 F (36.9 C) (Oral)  Resp 24  Ht 5\' 6"  (1.676 m)  Wt 149 lb (67.586 kg)  BMI 24.06 kg/m2  SpO2 95%   Physical Exam   General: Well-developed, well-nourished male; appearance consistent with age of record HENT: normocephalic; atraumatic; edentulous Eyes: pupils equal, round and reactive to light; extraocular muscles intact; arcus senilis bilaterally Neck: supple Heart: regular rate and rhythm; tachycardia Lungs: Tachypnea; decreased air movement bilaterally with faint wheezes Abdomen: soft; nondistended; nontender; bowel sounds present Extremities: No deformity; full range of motion; pulses normal Neurologic: Awake, alert and oriented; motor function intact in all extremities and symmetric; no facial droop Skin: Warm and dry Psychiatric: Normal mood and affect   ED Course  Procedures (including critical care time)   EKG Interpretation   Date/Time:  Monday March 11 2015 21:34:24 EDT Ventricular Rate:  95 PR Interval:  151 QRS Duration: 85 QT Interval:  342 QTC Calculation: 430 R Axis:   81 Text Interpretation:  Sinus rhythm Biatrial enlargement Borderline right  axis deviation No significant change was found Confirmed by Florina Ou  MD,  Jenny Reichmann ([id number]) on 03/11/2015 10:45:06 PM      MDM  Nursing notes and vitals signs, including pulse oximetry, reviewed.  Summary of this visit's results, reviewed by myself:  Labs:  Results for orders placed or performed during the hospital encounter of 03/11/15 (from the past 24 hour(s))  CBC with Differential/Platelet     Status: Abnormal   Collection Time: 03/11/15 11:12 PM  Result Value Ref Range   WBC 9.0 4.0 - 10.5 K/uL   RBC 4.41 4.22 - 5.81 MIL/uL   Hemoglobin 14.3 13.0 - 17.0 g/dL   HCT 40.7 39.0 - 52.0 %   MCV 92.3 78.0 - 100.0 fL   MCH 32.4 26.0 - 34.0 pg   MCHC 35.1 30.0 - 36.0 g/dL   RDW 12.5 11.5 - 15.5 %   Platelets 349 150 - 400 K/uL   Neutrophils Relative % 53 43 - 77 %   Neutro Abs 4.8 1.7 - 7.7 K/uL   Lymphocytes Relative 17 12 - 46 %   Lymphs Abs 1.5 0.7 - 4.0 K/uL   Monocytes Relative 9 3 - 12 %   Monocytes Absolute 0.8 0.1 - 1.0  K/uL   Eosinophils Relative 21 (H) 0 - 5 %   Eosinophils Absolute 1.9 (H) 0.0 - 0.7 K/uL   Basophils Relative 0 0 - 1 %   Basophils Absolute 0.0 0.0 - 0.1 K/uL  Basic metabolic panel     Status: Abnormal   Collection Time: 03/11/15 11:12 PM  Result Value Ref Range   Sodium 134 (L) 135 - 145 mmol/L   Potassium 4.0 3.5 - 5.1 mmol/L   Chloride 102 101 - 111 mmol/L   CO2 22 22 - 32 mmol/L   Glucose, Bld 101 (H) 65 - 99 mg/dL   BUN 11 6 - 20 mg/dL   Creatinine, Ser 0.90 0.61 - 1.24 mg/dL   Calcium 9.3 8.9 - 10.3 mg/dL   GFR calc non Af Amer >60 >60 mL/min   GFR calc Af Amer >60 >60 mL/min   Anion  gap 10 5 - 15    Imaging Studies: Dg Chest 2 View  03/11/2015   CLINICAL DATA:  Shortness of breath and productive cough for 3 weeks.  EXAM: CHEST  2 VIEW  COMPARISON:  PA and lateral chest 03/05/2015 and 12/24/2011.  FINDINGS: The lungs are clear. Heart size is normal. No pneumothorax or pleural effusion. No focal bony abnormality. Nodular opacity projecting over the left lower chest is consistent with the patient's nipple and unchanged since 2013.  IMPRESSION: No acute disease.   Electronically Signed   By: Inge Rise M.D.   On: 03/11/2015 21:47   12:09 AM Air movement improved patient feels significantly better after albuterol and Atrovent neb treatment. We will provide him with an AeroChamber and instructed him that she used. He hasn't neb machine at home but does not have to bring her medication for it. We will provide him with these.    Shanon Rosser, MD 03/12/15 0010

## 2015-03-12 MED ORDER — AEROCHAMBER PLUS W/MASK MISC
1.0000 | Freq: Once | Status: AC
  Administered 2015-03-12: 1
  Filled 2015-03-12: qty 1

## 2015-03-12 MED ORDER — ALBUTEROL SULFATE (2.5 MG/3ML) 0.083% IN NEBU: 2.5000 mg | INHALATION_SOLUTION | RESPIRATORY_TRACT | Status: DC | PRN

## 2015-03-12 MED ORDER — DEXAMETHASONE SODIUM PHOSPHATE 10 MG/ML IJ SOLN
10.0000 mg | Freq: Once | INTRAMUSCULAR | Status: AC
  Administered 2015-03-12: 10 mg via INTRAVENOUS
  Filled 2015-03-12: qty 1

## 2015-03-14 ENCOUNTER — Emergency Department (HOSPITAL_COMMUNITY)
Admission: EM | Admit: 2015-03-14 | Discharge: 2015-03-14 | Disposition: A | Discharge status: Home / Self Care | Payer: Non-veteran care | Attending: Emergency Medicine | Admitting: Emergency Medicine

## 2015-03-14 ENCOUNTER — Encounter (HOSPITAL_COMMUNITY): Payer: Self-pay | Admitting: Emergency Medicine

## 2015-03-14 DIAGNOSIS — Z7951 Long term (current) use of inhaled steroids: Secondary | ICD-10-CM | POA: Insufficient documentation

## 2015-03-14 DIAGNOSIS — Y92410 Unspecified street and highway as the place of occurrence of the external cause: Secondary | ICD-10-CM | POA: Diagnosis not present

## 2015-03-14 DIAGNOSIS — S50311A Abrasion of right elbow, initial encounter: Secondary | ICD-10-CM | POA: Diagnosis not present

## 2015-03-14 DIAGNOSIS — Y9389 Activity, other specified: Secondary | ICD-10-CM | POA: Insufficient documentation

## 2015-03-14 DIAGNOSIS — I10 Essential (primary) hypertension: Secondary | ICD-10-CM | POA: Insufficient documentation

## 2015-03-14 DIAGNOSIS — S80211A Abrasion, right knee, initial encounter: Secondary | ICD-10-CM | POA: Insufficient documentation

## 2015-03-14 DIAGNOSIS — S90414A Abrasion, right lesser toe(s), initial encounter: Secondary | ICD-10-CM | POA: Diagnosis not present

## 2015-03-14 DIAGNOSIS — S90511A Abrasion, right ankle, initial encounter: Secondary | ICD-10-CM | POA: Insufficient documentation

## 2015-03-14 DIAGNOSIS — Z792 Long term (current) use of antibiotics: Secondary | ICD-10-CM | POA: Insufficient documentation

## 2015-03-14 DIAGNOSIS — S90811A Abrasion, right foot, initial encounter: Secondary | ICD-10-CM | POA: Diagnosis not present

## 2015-03-14 DIAGNOSIS — Z79899 Other long term (current) drug therapy: Secondary | ICD-10-CM | POA: Insufficient documentation

## 2015-03-14 DIAGNOSIS — T148XXA Other injury of unspecified body region, initial encounter: Secondary | ICD-10-CM

## 2015-03-14 DIAGNOSIS — Y998 Other external cause status: Secondary | ICD-10-CM | POA: Insufficient documentation
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
## 2015-03-14 NOTE — Discharge Instructions (Signed)
Ibuprofen or tylenol for pain. Bacitracin ointment twice a day to the scrapes. Keep them clean, dry. Wash with soap and water. Watch for signs of infection.    Abrasion An abrasion is a cut or scrape of the skin. Abrasions do not extend through all layers of the skin and most heal within 10 days. It is important to care for your abrasion properly to prevent infection. CAUSES  Most abrasions are caused by falling on, or gliding across, the ground or other surface. When your skin rubs on something, the outer and inner layer of skin rubs off, causing an abrasion. DIAGNOSIS  Your caregiver will be able to diagnose an abrasion during a physical exam.  TREATMENT  Your treatment depends on how large and deep the abrasion is. Generally, your abrasion will be cleaned with water and a mild soap to remove any dirt or debris. An antibiotic ointment may be put over the abrasion to prevent an infection. A bandage (dressing) may be wrapped around the abrasion to keep it from getting dirty.  You may need a tetanus shot if:  You cannot remember when you had your last tetanus shot.  You have never had a tetanus shot.  The injury broke your skin. If you get a tetanus shot, your arm may swell, get red, and feel warm to the touch. This is common and not a problem. If you need a tetanus shot and you choose not to have one, there is a rare chance of getting tetanus. Sickness from tetanus can be serious.  HOME CARE INSTRUCTIONS   If a dressing was applied, change it at least once a day or as directed by your caregiver. If the bandage sticks, soak it off with warm water.   Wash the area with water and a mild soap to remove all the ointment 2 times a day. Rinse off the soap and pat the area dry with a clean towel.   Reapply any ointment as directed by your caregiver. This will help prevent infection and keep the bandage from sticking. Use gauze over the wound and under the dressing to help keep the bandage from  sticking.   Change your dressing right away if it becomes wet or dirty.   Only take over-the-counter or prescription medicines for pain, discomfort, or fever as directed by your caregiver.   Follow up with your caregiver within 24-48 hours for a wound check, or as directed. If you were not given a wound-check appointment, look closely at your abrasion for redness, swelling, or pus. These are signs of infection. SEEK IMMEDIATE MEDICAL CARE IF:   You have increasing pain in the wound.   You have redness, swelling, or tenderness around the wound.   You have pus coming from the wound.   You have a fever or persistent symptoms for more than 2-3 days.  You have a fever and your symptoms suddenly get worse.  You have a bad smell coming from the wound or dressing.  MAKE SURE YOU:   Understand these instructions.  Will watch your condition.  Will get help right away if you are not doing well or get worse. Document Released: 06/10/2005 Document Revised: 08/17/2012 Document Reviewed: 08/04/2011 [facility] Patient Information 2015 [place], [place]. This information is not intended to replace advice given to you by your health care provider. Make sure you discuss any questions you have with your health care provider.

## 2015-03-18 ENCOUNTER — Encounter (HOSPITAL_COMMUNITY): Payer: Self-pay | Admitting: Emergency Medicine

## 2015-03-18 ENCOUNTER — Emergency Department (HOSPITAL_COMMUNITY): Payer: Non-veteran care

## 2015-03-18 ENCOUNTER — Emergency Department (HOSPITAL_COMMUNITY)
Admission: EM | Admit: 2015-03-18 | Discharge: 2015-03-18 | Disposition: A | Discharge status: Home / Self Care | Payer: Non-veteran care | Attending: Emergency Medicine | Admitting: Emergency Medicine

## 2015-03-18 DIAGNOSIS — I10 Essential (primary) hypertension: Secondary | ICD-10-CM | POA: Diagnosis not present

## 2015-03-18 DIAGNOSIS — R059 Cough, unspecified: Secondary | ICD-10-CM

## 2015-03-18 DIAGNOSIS — R05 Cough: Secondary | ICD-10-CM | POA: Diagnosis not present

## 2015-03-18 DIAGNOSIS — Z9889 Other specified postprocedural states: Secondary | ICD-10-CM | POA: Diagnosis not present

## 2015-03-18 DIAGNOSIS — Z792 Long term (current) use of antibiotics: Secondary | ICD-10-CM | POA: Insufficient documentation

## 2015-03-18 DIAGNOSIS — Z79899 Other long term (current) drug therapy: Secondary | ICD-10-CM | POA: Diagnosis not present

## 2015-03-18 DIAGNOSIS — R0602 Shortness of breath: Secondary | ICD-10-CM | POA: Diagnosis present

## 2015-03-18 LAB — COMPREHENSIVE METABOLIC PANEL
ALT: 46 U/L (ref 17–63)
AST: 51 U/L — ABNORMAL HIGH (ref 15–41)
Albumin: 3.7 g/dL (ref 3.5–5.0)
Alkaline Phosphatase: 74 U/L (ref 38–126)
Anion gap: 12 (ref 5–15)
BILIRUBIN TOTAL: 0.5 mg/dL (ref 0.3–1.2)
BUN: 6 mg/dL (ref 6–20)
CHLORIDE: 101 mmol/L (ref 101–111)
CO2: 24 mmol/L (ref 22–32)
Calcium: 9.2 mg/dL (ref 8.9–10.3)
Creatinine, Ser: 0.88 mg/dL (ref 0.61–1.24)
GFR calc Af Amer: >60 mL/min (ref >60)
GFR calc non Af Amer: >60 mL/min (ref >60)
Glucose, Bld: 118 mg/dL — ABNORMAL HIGH (ref 65–99)
Potassium: 3.9 mmol/L (ref 3.5–5.1)
Sodium: 137 mmol/L (ref 135–145)
Total Protein: 8.2 g/dL — ABNORMAL HIGH (ref 6.5–8.1)

## 2015-03-18 LAB — CBC WITH DIFFERENTIAL/PLATELET
Basophils Absolute: 0 10*3/uL (ref 0.0–0.1)
Basophils Relative: 0 % (ref 0–1)
EOS PCT: 16 % — AB (ref 0–5)
Eosinophils Absolute: 1.4 10*3/uL — ABNORMAL HIGH (ref 0.0–0.7)
HEMATOCRIT: 40.1 % (ref 39.0–52.0)
HEMOGLOBIN: 14.1 g/dL (ref 13.0–17.0)
Lymphocytes Relative: 10 % — ABNORMAL LOW (ref 12–46)
Lymphs Abs: 0.9 10*3/uL (ref 0.7–4.0)
MCH: 32.6 pg (ref 26.0–34.0)
MCHC: 35.2 g/dL (ref 30.0–36.0)
MCV: 92.6 fL (ref 78.0–100.0)
MONOS PCT: 8 % (ref 3–12)
Monocytes Absolute: 0.7 10*3/uL (ref 0.1–1.0)
Neutro Abs: 6 10*3/uL (ref 1.7–7.7)
Neutrophils Relative %: 66 % (ref 43–77)
PLATELETS: 388 10*3/uL (ref 150–400)
RBC: 4.33 MIL/uL (ref 4.22–5.81)
RDW: 12.4 % (ref 11.5–15.5)
WBC: 9 10*3/uL (ref 4.0–10.5)

## 2015-03-18 LAB — D-DIMER, QUANTITATIVE: D-Dimer, Quant: 0.55 ug/mL-FEU — ABNORMAL HIGH (ref 0.00–0.48)

## 2015-03-18 MED ORDER — IPRATROPIUM-ALBUTEROL 0.5-2.5 (3) MG/3ML IN SOLN
3.0000 mL | RESPIRATORY_TRACT | Status: DC
  Administered 2015-03-18: 3 mL via RESPIRATORY_TRACT

## 2015-03-18 MED ORDER — IPRATROPIUM-ALBUTEROL 0.5-2.5 (3) MG/3ML IN SOLN
3.0000 mL | Freq: Once | RESPIRATORY_TRACT | Status: DC
  Filled 2015-03-18: qty 3

## 2015-03-18 MED ORDER — GUAIFENESIN-CODEINE 100-10 MG/5ML PO SOLN: 5.0000 mL | Freq: Three times a day (TID) | ORAL | Status: DC | PRN

## 2015-03-18 MED ORDER — AEROCHAMBER PLUS W/MASK MISC
1.0000 | Freq: Once | Status: AC
  Administered 2015-03-18: 1
  Filled 2015-03-18: qty 1

## 2015-03-18 MED ORDER — ALBUTEROL SULFATE HFA 108 (90 BASE) MCG/ACT IN AERS
2.0000 | INHALATION_SPRAY | Freq: Once | RESPIRATORY_TRACT | Status: AC
  Administered 2015-03-18: 2 via RESPIRATORY_TRACT
  Filled 2015-03-18: qty 6.7

## 2015-03-18 MED ORDER — IOHEXOL 350 MG/ML SOLN
100.0000 mL | Freq: Once | INTRAVENOUS | Status: AC | PRN
  Administered 2015-03-18: 100 mL via INTRAVENOUS

## 2015-03-18 NOTE — ED Notes (Signed)
MD at bedside. EDPA BEN CARTNER PRESENT

## 2015-03-18 NOTE — Progress Notes (Signed)
[facility] [place] [phone]

## 2015-03-18 NOTE — ED Notes (Signed)
PA at bedside.

## 2015-03-18 NOTE — ED Provider Notes (Signed)
CSN: [id number]     Arrival date & time 03/18/15  1154 History   First MD Initiated Contact with Patient 03/18/15 1207     Chief Complaint  Patient presents with  . Shortness of Breath  . Cough     (Consider location/radiation/quality/duration/timing/severity/associated sxs/prior Treatment) HPI [name] [name] is a 64 y.o. male with a history of hypertension comes in for evaluation of cough. Patient says for the past 3 weeks he has had a chronic cough that has gotten worse over the past couple of days. He states he was seen in the ED, diagnosed with "a summer cold" and discharged with cough syrup and antibiotic's. Reports associated shortness of breath. Denies any fevers, chills, hemoptysis, nausea or vomiting, leg swelling. He tried using a albuterol nebulized treatment this morning without relief.  Past Medical History  Diagnosis Date  . HTN (hypertension)   . Bradycardia, sinus 12/24/11  . Syncope and collapse 12/24/11    2/2 hypotension in the setting of dehydration  . Hypotension 12/24/11    in the setting of dehydration   . Abnormal EKG 12/24/11    anteroseptal and lateral ST elevation, felt r/t early repolarization;  Cardiac cath 12/24/11 - normal coronary anatomy, EF 55-65%  . Marijuana use   . History of alcohol abuse     hospitalized for detox 2002   Past Surgical History  Procedure Laterality Date  . Circumcision  1972  . Laceration repair  02/2004    arthroscopic debridement of triagular fibrocartilage tear/E-chart; right wrist  . Cardiac catheterization  12/24/11    normal coronary anatomy, EF 55-65%  . Left heart catheterization with coronary angiogram N/A 12/24/2011    Procedure: LEFT HEART CATHETERIZATION WITH CORONARY ANGIOGRAM;  Surgeon: Peter M Martinique, MD;  Location: [facility] CATH LAB;  Service: Cardiovascular;  Laterality: N/A;   History reviewed. No pertinent family history. History  Substance Use Topics  . Smoking status: Never Smoker   . Smokeless tobacco: Never Used   . Alcohol Use: Yes     Comment: 12/24/11 "last alcohol was 2000"    Review of Systems A 10 point review of systems was completed and was negative except for pertinent positives and negatives as mentioned in the history of present illness     Allergies  Review of patient's allergies indicates no known allergies.  Home Medications   Prior to Admission medications   Medication Sig Start Date End Date Taking? Authorizing Provider  albuterol (PROVENTIL HFA;VENTOLIN HFA) 108 (90 BASE) MCG/ACT inhaler Inhale 1-2 puffs into the lungs every 6 (six) hours as needed for wheezing or shortness of breath.   Yes Historical Provider, MD  albuterol (PROVENTIL) (2.5 MG/3ML) 0.083% nebulizer solution Take 3-6 mLs (2.5-5 mg total) by nebulization every 4 (four) hours as needed for wheezing or shortness of breath. 03/12/15  Yes John Molpus, MD  amoxicillin (AMOXIL) 500 MG capsule Take 2 capsules (1,000 mg total) by mouth 2 (two) times daily. 03/05/15  Yes Shari Upstill, PA-C  Aspirin-Acetaminophen-Caffeine (GOODY HEADACHE PO) Take 1 packet by mouth every 6 (six) hours as needed (for pain.).   Yes Historical Provider, MD  chlorpheniramine-HYDROcodone (TUSSIONEX PENNKINETIC ER) 10-8 MG/5ML SUER Take 5 mLs by mouth at bedtime as needed for cough. 03/05/15  Yes Shari Upstill, PA-C  ibuprofen (ADVIL,MOTRIN) 200 MG tablet Take 800 mg by mouth every 6 (six) hours as needed for moderate pain.   Yes Historical Provider, MD  lisinopril-hydrochlorothiazide (PRINZIDE,ZESTORETIC) 10-12.5 MG per tablet Take 1 tablet by mouth daily.  Yes Historical Provider, MD  omeprazole (PRILOSEC) 20 MG capsule Take 20 mg by mouth daily.   Yes Historical Provider, MD  guaiFENesin-codeine 100-10 MG/5ML syrup Take 5 mLs by mouth 3 (three) times daily as needed for cough. 03/18/15   Comer Locket, PA-C  ibuprofen (ADVIL,MOTRIN) 600 MG tablet Take 1 tablet (600 mg total) by mouth every 6 (six) hours as needed. Patient not taking: Reported on  03/18/2015 10/07/14   Virgel Manifold, MD   BP 140/83 mmHg  Pulse 102  Temp(Src) 98.5 F (36.9 C) (Oral)  Resp 23  SpO2 96% Physical Exam  Constitutional: He is oriented to person, place, and time. He appears well-developed and well-nourished.  HENT:  Head: Normocephalic and atraumatic.  Mouth/Throat: Oropharynx is clear and moist.  Eyes: Conjunctivae are normal. Pupils are equal, round, and reactive to light. Right eye exhibits no discharge. Left eye exhibits no discharge. No scleral icterus.  Neck: Neck supple.  Cardiovascular: Normal rate, regular rhythm and normal heart sounds.   Pulmonary/Chest: Effort normal and breath sounds normal. No respiratory distress. He has no wheezes. He has no rales.  Abdominal: Soft. There is no tenderness.  Musculoskeletal: He exhibits no tenderness.  Neurological: He is alert and oriented to person, place, and time.  Cranial Nerves II-XII grossly intact  Skin: Skin is warm and dry. No rash noted.  Psychiatric: He has a normal mood and affect.  Nursing note and vitals reviewed.   ED Course  Procedures (including critical care time) Labs Review Labs Reviewed  COMPREHENSIVE METABOLIC PANEL - Abnormal; Notable for the following:    Glucose, Bld 118 (*)    Total Protein 8.2 (*)    AST 51 (*)    All other components within normal limits  CBC WITH DIFFERENTIAL/PLATELET - Abnormal; Notable for the following:    Lymphocytes Relative 10 (*)    Eosinophils Relative 16 (*)    Eosinophils Absolute 1.4 (*)    All other components within normal limits  D-DIMER, QUANTITATIVE (NOT AT [facility]) - Abnormal; Notable for the following:    D-Dimer, Quant 0.55 (*)    All other components within normal limits    Imaging Review Dg Chest 2 View (if Patient Has Fever And/or Copd)  03/18/2015   CLINICAL DATA:  Acute onset of cough and shortness of breath. Current history of hypertension.  EXAM: CHEST  2 VIEW  COMPARISON:  03/11/2015 and earlier.  FINDINGS: AP semi-erect  and lateral images were obtained. Cardiomediastinal silhouette unremarkable, unchanged. Nodular opacity projected over the lateral right lung base was shown on prior examinations to represent the nipple shadow. Mild central peribronchial thickening, more so than on the prior examinations. Lungs otherwise clear. No localized airspace consolidation. No pleural effusions. No pneumothorax. Normal pulmonary vascularity. Mild degenerative changes involving the thoracic spine and slight thoracic scoliosis convex right, unchanged.  IMPRESSION: Mild changes of acute bronchitis and/or asthma without focal airspace pneumonia.   Electronically Signed   By: Evangeline Dakin M.D.   On: 03/18/2015 12:27   Ct Angio Chest Pe W/cm &/or Wo Cm  03/18/2015   CLINICAL DATA:  Shortness of breath and excessive cough. History of asthma/ COPD.  EXAM: CT ANGIOGRAPHY CHEST WITH CONTRAST  TECHNIQUE: Multidetector CT imaging of the chest was performed using the standard protocol during bolus administration of intravenous contrast. Multiplanar CT image reconstructions and MIPs were obtained to evaluate the vascular anatomy.  CONTRAST:  195mL OMNIPAQUE IOHEXOL 350 MG/ML SOLN  COMPARISON:  Plain film of earlier today.  No prior CT.  FINDINGS: Mediastinum/Nodes: The quality of this exam for evaluation of pulmonary embolism is poor. Despite 2 attempts, the bolus is poorly timed, with contrast both in the aorta and SVC. No central or large lobar pulmonary embolism. Smaller emboli cannot be excluded.  Bovine arch. Aortic and branch vessel atherosclerosis. Normal heart size, without pericardial effusion. No mediastinal or hilar adenopathy.  Lungs/Pleura: No pleural fluid. Lower lobe predominant bronchial wall thickening. Mild to moderate centrilobular emphysema. Biapical pleural thickening.  Minimal dependent posterior right upper lobe opacity is favored to represent atelectasis.  Upper abdomen: Normal imaged portions of the liver, spleen, stomach,  pancreas, adrenal glands, kidneys.  Musculoskeletal: No acute osseous abnormality. Convex right thoracic spine curvature.  Review of the MIP images confirms the above findings.  IMPRESSION: 1. Suboptimal exam, secondary to the poor bolus timing, despite 2 attempts. No central or large lobar embolism. 2. Centrilobular emphysema, without other explanation for patient's symptoms.   Electronically Signed   By: Abigail Miyamoto M.D.   On: 03/18/2015 14:43     EKG Interpretation None     Meds given in ED:  Medications  ipratropium-albuterol (DUONEB) 0.5-2.5 (3) MG/3ML nebulizer solution 3 mL (3 mLs Nebulization Not Given 03/18/15 1215)  ipratropium-albuterol (DUONEB) 0.5-2.5 (3) MG/3ML nebulizer solution 3 mL (3 mLs Nebulization Given 03/18/15 1234)  albuterol (PROVENTIL HFA;VENTOLIN HFA) 108 (90 BASE) MCG/ACT inhaler 2 puff (not administered)  aerochamber plus with mask device 1 each (not administered)  iohexol (OMNIPAQUE) 350 MG/ML injection 100 mL (100 mLs Intravenous Contrast Given 03/18/15 1354)    New Prescriptions   GUAIFENESIN-CODEINE 100-10 MG/5ML SYRUP    Take 5 mLs by mouth 3 (three) times daily as needed for cough.   Filed Vitals:   03/18/15 1158 03/18/15 1159 03/18/15 1234 03/18/15 1432  BP: 154/80   140/83  Pulse: 123   102  Temp: 98.6 F (37 C)   98.5 F (36.9 C)  TempSrc: Oral   Oral  Resp: 30   23  SpO2: 98% 98% 96% 96%    MDM  Vitals stable - tachycardia improving -afebrile Pt resting comfortably in ED.  states he feels much better after administration of DuoNeb. Denies any shortness of breath. PE--normal lung exam. Tachycardia on auscultation, no evidence of CHF Labwork--d-dimer positive at 0.55, labs otherwise noncontributory. Imaging--CT chest explains a suboptimal exam but no evidence of central or lobar embolism.  Patient with cough likely secondary to viral etiology. No evidence of acute bacterial process. No evidence of other emergent pathology at this time. Will DC  with cough medicines, albuterol inhaler with spacer and have patient follow-up with primary care for further evaluation and management of symptoms.  I discussed all relevant lab findings and imaging results with pt and they verbalized understanding. Discussed f/u with PCP within 48 hrs and return precautions, pt very amenable to plan.  Final diagnoses:  Cough        Comer Locket, PA-C 03/18/15 1603  Virgel Manifold, MD 03/19/15 [id number]

## 2015-03-18 NOTE — ED Provider Notes (Signed)
EKG:  Rhythm: sinus tachycardia. Baseline wander. Rate: 115 PR: 145 ms QRS: 84 ms QTc: 497 ms ST segments: NS ST changes   Virgel Manifold, MD 03/18/15 1240

## 2015-03-18 NOTE — Discharge Instructions (Signed)
You were evaluated in the ED today for your cough. There does not appear to be an emergent cause for your symptoms at this time. Her symptoms are likely due to a virus. Please take your medications as needed for cough. Do not drive or operate machinery while taking this medicine. Please follow-up with your primary care for further evaluation and management of your symptoms. Return to ED for worsening symptoms.  Cough, Adult  A cough is a reflex that helps clear your throat and airways. It can help heal the body or may be a reaction to an irritated airway. A cough may only last 2 or 3 weeks (acute) or may last more than 8 weeks (chronic).  CAUSES Acute cough:  Viral or bacterial infections. Chronic cough:  Infections.  Allergies.  Asthma.  Post-nasal drip.  Smoking.  Heartburn or acid reflux.  Some medicines.  Chronic lung problems (COPD).  Cancer. SYMPTOMS   Cough.  Fever.  Chest pain.  Increased breathing rate.  High-pitched whistling sound when breathing (wheezing).  Colored mucus that you cough up (sputum). TREATMENT   A bacterial cough may be treated with antibiotic medicine.  A viral cough must run its course and will not respond to antibiotics.  Your caregiver may recommend other treatments if you have a chronic cough. HOME CARE INSTRUCTIONS   Only take over-the-counter or prescription medicines for pain, discomfort, or fever as directed by your caregiver. Use cough suppressants only as directed by your caregiver.  Use a cold steam vaporizer or humidifier in your bedroom or home to help loosen secretions.  Sleep in a semi-upright position if your cough is worse at night.  Rest as needed.  Stop smoking if you smoke. SEEK IMMEDIATE MEDICAL CARE IF:   You have pus in your sputum.  Your cough starts to worsen.  You cannot control your cough with suppressants and are losing sleep.  You begin coughing up blood.  You have difficulty breathing.  You  develop pain which is getting worse or is uncontrolled with medicine.  You have a fever. MAKE SURE YOU:   Understand these instructions.  Will watch your condition.  Will get help right away if you are not doing well or get worse. Document Released: 02/27/2011 Document Revised: 11/23/2011 Document Reviewed: 02/27/2011 [facility] Patient Information 2015 [place], [place]. This information is not intended to replace advice given to you by your health care provider. Make sure you discuss any questions you have with your health care provider.

## 2015-03-18 NOTE — ED Notes (Signed)
Patient brought in by family with complaints of SOB and excessive cough. States that he was given cough syrup and antibiotics with no relief. No hx asthma, copd.

## 2015-03-18 NOTE — ED Notes (Signed)
Patient transported to X-ray 

## 2015-03-20 ENCOUNTER — Encounter (HOSPITAL_COMMUNITY): Payer: Self-pay | Admitting: Emergency Medicine

## 2015-03-20 ENCOUNTER — Emergency Department (HOSPITAL_COMMUNITY)
Admission: EM | Admit: 2015-03-20 | Discharge: 2015-03-21 | Disposition: A | Discharge status: Home / Self Care | Payer: Non-veteran care | Attending: Emergency Medicine | Admitting: Emergency Medicine

## 2015-03-20 ENCOUNTER — Emergency Department (HOSPITAL_COMMUNITY): Payer: Non-veteran care

## 2015-03-20 DIAGNOSIS — Z79899 Other long term (current) drug therapy: Secondary | ICD-10-CM | POA: Insufficient documentation

## 2015-03-20 DIAGNOSIS — Z720 Tobacco use: Secondary | ICD-10-CM | POA: Insufficient documentation

## 2015-03-20 DIAGNOSIS — J209 Acute bronchitis, unspecified: Secondary | ICD-10-CM | POA: Insufficient documentation

## 2015-03-20 DIAGNOSIS — I10 Essential (primary) hypertension: Secondary | ICD-10-CM | POA: Insufficient documentation

## 2015-03-20 LAB — BASIC METABOLIC PANEL
Anion gap: 8 (ref 5–15)
BUN: 10 mg/dL (ref 6–20)
CALCIUM: 8.8 mg/dL — AB (ref 8.9–10.3)
CO2: 24 mmol/L (ref 22–32)
Chloride: 102 mmol/L (ref 101–111)
Creatinine, Ser: 0.87 mg/dL (ref 0.61–1.24)
GFR calc Af Amer: >60 mL/min (ref >60)
GLUCOSE: 124 mg/dL — AB (ref 65–99)
Potassium: 3.5 mmol/L (ref 3.5–5.1)
Sodium: 134 mmol/L — ABNORMAL LOW (ref 135–145)

## 2015-03-20 LAB — CBC
HEMATOCRIT: 37.6 % — AB (ref 39.0–52.0)
Hemoglobin: 13 g/dL (ref 13.0–17.0)
MCH: 31.9 pg (ref 26.0–34.0)
MCHC: 34.6 g/dL (ref 30.0–36.0)
MCV: 92.4 fL (ref 78.0–100.0)
Platelets: 388 10*3/uL (ref 150–400)
RBC: 4.07 MIL/uL — AB (ref 4.22–5.81)
RDW: 12.7 % (ref 11.5–15.5)
WBC: 10.1 10*3/uL (ref 4.0–10.5)

## 2015-03-20 LAB — I-STAT TROPONIN, ED: Troponin i, poc: 0 ng/mL (ref 0.00–0.08)

## 2015-03-20 MED ORDER — SODIUM CHLORIDE 0.9 % IV SOLN
INTRAVENOUS | Status: DC
  Administered 2015-03-20: 20:00:00 via INTRAVENOUS

## 2015-03-20 MED ORDER — AEROCHAMBER Z-STAT PLUS/MEDIUM MISC
1.0000 | Freq: Once | Status: DC
Start: 2015-03-20 — End: 2015-03-21
  Filled 2015-03-20: qty 1

## 2015-03-20 MED ORDER — ALBUTEROL SULFATE (2.5 MG/3ML) 0.083% IN NEBU
5.0000 mg | INHALATION_SOLUTION | Freq: Once | RESPIRATORY_TRACT | Status: AC
  Administered 2015-03-20: 5 mg via RESPIRATORY_TRACT
  Filled 2015-03-20: qty 6

## 2015-03-20 MED ORDER — PREDNISONE 10 MG PO TABS: ORAL_TABLET | ORAL | Status: DC

## 2015-03-20 MED ORDER — ALBUTEROL SULFATE HFA 108 (90 BASE) MCG/ACT IN AERS
2.0000 | INHALATION_SPRAY | RESPIRATORY_TRACT | Status: DC | PRN
  Administered 2015-03-21: 2 via RESPIRATORY_TRACT
  Filled 2015-03-20: qty 6.7

## 2015-03-20 MED ORDER — AZITHROMYCIN 250 MG PO TABS: ORAL_TABLET | ORAL | Status: DC

## 2015-03-20 MED ORDER — SODIUM CHLORIDE 0.9 % IV BOLUS (SEPSIS)
500.0000 mL | Freq: Once | INTRAVENOUS | Status: AC
  Administered 2015-03-20: 500 mL via INTRAVENOUS

## 2015-03-20 MED ORDER — METHYLPREDNISOLONE SODIUM SUCC 125 MG IJ SOLR
125.0000 mg | Freq: Once | INTRAMUSCULAR | Status: AC
  Administered 2015-03-20: 125 mg via INTRAVENOUS
  Filled 2015-03-20: qty 2

## 2015-03-20 MED ORDER — FENTANYL CITRATE (PF) 100 MCG/2ML IJ SOLN
50.0000 ug | Freq: Once | INTRAMUSCULAR | Status: AC
  Administered 2015-03-20: 50 ug via INTRAVENOUS
  Filled 2015-03-20: qty 2

## 2015-03-20 MED ORDER — ONDANSETRON HCL 4 MG/2ML IJ SOLN
4.0000 mg | Freq: Once | INTRAMUSCULAR | Status: AC
  Administered 2015-03-20: 4 mg via INTRAVENOUS
  Filled 2015-03-20: qty 2

## 2015-03-20 MED ORDER — IPRATROPIUM-ALBUTEROL 0.5-2.5 (3) MG/3ML IN SOLN
3.0000 mL | Freq: Once | RESPIRATORY_TRACT | Status: AC
  Administered 2015-03-20: 3 mL via RESPIRATORY_TRACT
  Filled 2015-03-20: qty 3

## 2015-03-20 MED ORDER — ALBUTEROL (5 MG/ML) CONTINUOUS INHALATION SOLN
15.0000 mg | INHALATION_SOLUTION | Freq: Once | RESPIRATORY_TRACT | Status: AC
  Administered 2015-03-20: 15 mg via RESPIRATORY_TRACT
  Filled 2015-03-20: qty 20

## 2015-03-20 NOTE — Discharge Instructions (Signed)
Acute Bronchitis Bronchitis is inflammation of the airways that extend from the windpipe into the lungs (bronchi). The inflammation often causes mucus to develop. This leads to a cough, which is the most common symptom of bronchitis.  In acute bronchitis, the condition usually develops suddenly and goes away over time, usually in a couple weeks. Smoking, allergies, and asthma can make bronchitis worse. Repeated episodes of bronchitis may cause further lung problems.  CAUSES Acute bronchitis is most often caused by the same virus that causes a cold. The virus can spread from person to person (contagious) through coughing, sneezing, and touching contaminated objects. SIGNS AND SYMPTOMS   Cough.   Fever.   Coughing up mucus.   Body aches.   Chest congestion.   Chills.   Shortness of breath.   Sore throat.  DIAGNOSIS  Acute bronchitis is usually diagnosed through a physical exam. Your health care provider will also ask you questions about your medical history. Tests, such as chest X-rays, are sometimes done to rule out other conditions.  TREATMENT  Acute bronchitis usually goes away in a couple weeks. Oftentimes, no medical treatment is necessary. Medicines are sometimes given for relief of fever or cough. Antibiotic medicines are usually not needed but may be prescribed in certain situations. In some cases, an inhaler may be recommended to help reduce shortness of breath and control the cough. A cool mist vaporizer may also be used to help thin bronchial secretions and make it easier to clear the chest.  HOME CARE INSTRUCTIONS  Get plenty of rest.   Drink enough fluids to keep your urine clear or pale yellow (unless you have a medical condition that requires fluid restriction). Increasing fluids may help thin your respiratory secretions (sputum) and reduce chest congestion, and it will prevent dehydration.   Take medicines only as directed by your health care provider.  If  you were prescribed an antibiotic medicine, finish it all even if you start to feel better.  Avoid smoking and secondhand smoke. Exposure to cigarette smoke or irritating chemicals will make bronchitis worse. If you are a smoker, consider using nicotine gum or skin patches to help control withdrawal symptoms. Quitting smoking will help your lungs heal faster.   Reduce the chances of another bout of acute bronchitis by washing your hands frequently, avoiding people with cold symptoms, and trying not to touch your hands to your mouth, nose, or eyes.   Keep all follow-up visits as directed by your health care provider.  SEEK MEDICAL CARE IF: Your symptoms do not improve after 1 week of treatment.  SEEK IMMEDIATE MEDICAL CARE IF:  You develop an increased fever or chills.   You have chest pain.   You have severe shortness of breath.  You have bloody sputum.   You develop dehydration.  You faint or repeatedly feel like you are going to pass out.  You develop repeated vomiting.  You develop a severe headache. MAKE SURE YOU:   Understand these instructions.  Will watch your condition.  Will get help right away if you are not doing well or get worse. Document Released: 10/08/2004 Document Revised: 01/15/2014 Document Reviewed: 02/21/2013 [employer] Patient Information 2015 [employer]. This information is not intended to replace advice given to you by your health care provider. Make sure you discuss any questions you have with your health care provider. Smoking Cessation Quitting smoking is important to your health and has many advantages. However, it is not always easy to quit since nicotine is   a very addictive drug. Oftentimes, people try 3 times or more before being able to quit. This document explains the best ways for you to prepare to quit smoking. Quitting takes hard work and a lot of effort, but you can do it. ADVANTAGES OF QUITTING SMOKING  You will live longer, feel  better, and live better.  Your body will feel the impact of quitting smoking almost immediately.  Within 20 minutes, blood pressure decreases. Your pulse returns to its normal level.  After 8 hours, carbon monoxide levels in the blood return to normal. Your oxygen level increases.  After 24 hours, the chance of having a heart attack starts to decrease. Your breath, hair, and body stop smelling like smoke.  After 48 hours, damaged nerve endings begin to recover. Your sense of taste and smell improve.  After 72 hours, the body is virtually free of nicotine. Your bronchial tubes relax and breathing becomes easier.  After 2 to 12 weeks, lungs can hold more air. Exercise becomes easier and circulation improves.  The risk of having a heart attack, stroke, cancer, or lung disease is greatly reduced.  After 1 year, the risk of coronary heart disease is cut in half.  After 5 years, the risk of stroke falls to the same as a nonsmoker.  After 10 years, the risk of lung cancer is cut in half and the risk of other cancers decreases significantly.  After 15 years, the risk of coronary heart disease drops, usually to the level of a nonsmoker.  If you are pregnant, quitting smoking will improve your chances of having a healthy baby.  The people you live with, especially any children, will be healthier.  You will have extra money to spend on things other than cigarettes. QUESTIONS TO THINK ABOUT BEFORE ATTEMPTING TO QUIT You may want to talk about your answers with your health care provider.  Why do you want to quit?  If you tried to quit in the past, what helped and what did not?  What will be the most difficult situations for you after you quit? How will you plan to handle them?  Who can help you through the tough times? Your family? Friends? A health care provider?  What pleasures do you get from smoking? What ways can you still get pleasure if you quit? Here are some questions to ask  your health care provider:  How can you help me to be successful at quitting?  What medicine do you think would be best for me and how should I take it?  What should I do if I need more help?  What is smoking withdrawal like? How can I get information on withdrawal? GET READY  Set a quit date.  Change your environment by getting rid of all cigarettes, ashtrays, matches, and lighters in your home, car, or work. Do not let people smoke in your home.  Review your past attempts to quit. Think about what worked and what did not. GET SUPPORT AND ENCOURAGEMENT You have a better chance of being successful if you have help. You can get support in many ways.  Tell your family, friends, and coworkers that you are going to quit and need their support. Ask them not to smoke around you.  Get individual, group, or telephone counseling and support. Programs are available at local hospitals and health centers. Call your local health department for information about programs in your area.  Spiritual beliefs and practices may help some smokers quit.  Download   a "quit meter" on your computer to keep track of quit statistics, such as how long you have gone without smoking, cigarettes not smoked, and money saved.  Get a self-help book about quitting smoking and staying off tobacco. LEARN NEW SKILLS AND BEHAVIORS  Distract yourself from urges to smoke. Talk to someone, go for a walk, or occupy your time with a task.  Change your normal routine. Take a different route to work. Drink tea instead of coffee. Eat breakfast in a different place.  Reduce your stress. Take a hot bath, exercise, or read a book.  Plan something enjoyable to do every day. Reward yourself for not smoking.  Explore interactive web-based programs that specialize in helping you quit. GET MEDICINE AND USE IT CORRECTLY Medicines can help you stop smoking and decrease the urge to smoke. Combining medicine with the above behavioral  methods and support can greatly increase your chances of successfully quitting smoking.  Nicotine replacement therapy helps deliver nicotine to your body without the negative effects and risks of smoking. Nicotine replacement therapy includes nicotine gum, lozenges, inhalers, nasal sprays, and skin patches. Some may be available over-the-counter and others require a prescription.  Antidepressant medicine helps people abstain from smoking, but how this works is unknown. This medicine is available by prescription.  Nicotinic receptor partial agonist medicine simulates the effect of nicotine in your brain. This medicine is available by prescription. Ask your health care provider for advice about which medicines to use and how to use them based on your health history. Your health care provider will tell you what side effects to look out for if you choose to be on a medicine or therapy. Carefully read the information on the package. Do not use any other product containing nicotine while using a nicotine replacement product.  RELAPSE OR DIFFICULT SITUATIONS Most relapses occur within the first 3 months after quitting. Do not be discouraged if you start smoking again. Remember, most people try several times before finally quitting. You may have symptoms of withdrawal because your body is used to nicotine. You may crave cigarettes, be irritable, feel very hungry, cough often, get headaches, or have difficulty concentrating. The withdrawal symptoms are only temporary. They are strongest when you first quit, but they will go away within 10-14 days. To reduce the chances of relapse, try to:  Avoid drinking alcohol. Drinking lowers your chances of successfully quitting.  Reduce the amount of caffeine you consume. Once you quit smoking, the amount of caffeine in your body increases and can give you symptoms, such as a rapid heartbeat, sweating, and anxiety.  Avoid smokers because they can make you want to  smoke.  Do not let weight gain distract you. Many smokers will gain weight when they quit, usually less than 10 pounds. Eat a healthy diet and stay active. You can always lose the weight gained after you quit.  Find ways to improve your mood other than smoking. FOR MORE INFORMATION  www.smokefree.gov  Document Released: 08/25/2001 Document Revised: 01/15/2014 Document Reviewed: 12/10/2011 [employer] Patient Information 2015 [employer]. This information is not intended to replace advice given to you by your health care provider. Make sure you discuss any questions you have with your health care provider.  

## 2015-03-20 NOTE — ED Notes (Addendum)
Pt was recently seen in the hospital for similar symptoms. Hx marijuana use and ETOH use. Has not smoked or drank alcohol x10 days. Took entire regimen of prescribed Amoxicillin and used 2.5 mg Albuterol today before arrival. Still experiencing SOB. Decreased appetite and decreased taste for food. Unable to speak without coughing and voice is strained and hoarse. No wheezing noted bilaterally but rhonchi noted. Reports green and brown sputum. Denies fevers but says he has been having "hot sweats and hot flashes." Denies abdominal pain/diarrhea. No other c/c. Was supposed to be admitted last time but refused to stay.

## 2015-03-20 NOTE — ED Provider Notes (Signed)
CSN: [id number]     Arrival date & time 03/20/15  1500 History   First MD Initiated Contact with Patient 03/20/15 1542     Chief Complaint  Patient presents with  . Shortness of Breath  . Cough     (Consider location/radiation/quality/duration/timing/severity/associated sxs/prior Treatment) HPI   [name] [name] is a 64 y.o. male who presents for evaluation of continued cough and shortness of breath. He stopped smoking cigarettes 10 days ago. He has been seen in the ED several times for the same complaint. He has been treated with various medications including amoxicillin, albuterol nebulizer, and antitussives narcotic medications. He is unable to sleep or eat because of persistent coughing. He denies fever, chills, weakness or dizziness. He has a mild headache at this time. He is an abuser of both alcohol and marijuana. There are no other known modifying factors.   Past Medical History  Diagnosis Date  . HTN (hypertension)   . Bradycardia, sinus 12/24/11  . Syncope and collapse 12/24/11    2/2 hypotension in the setting of dehydration  . Hypotension 12/24/11    in the setting of dehydration   . Abnormal EKG 12/24/11    anteroseptal and lateral ST elevation, felt r/t early repolarization;  Cardiac cath 12/24/11 - normal coronary anatomy, EF 55-65%  . Marijuana use   . History of alcohol abuse     hospitalized for detox 2002   Past Surgical History  Procedure Laterality Date  . Circumcision  1972  . Laceration repair  02/2004    arthroscopic debridement of triagular fibrocartilage tear/E-chart; right wrist  . Cardiac catheterization  12/24/11    normal coronary anatomy, EF 55-65%  . Left heart catheterization with coronary angiogram N/A 12/24/2011    Procedure: LEFT HEART CATHETERIZATION WITH CORONARY ANGIOGRAM;  Surgeon: Peter M Martinique, MD;  Location: [facility] CATH LAB;  Service: Cardiovascular;  Laterality: N/A;   History reviewed. No pertinent family history. History  Substance Use Topics   . Smoking status: Never Smoker   . Smokeless tobacco: Never Used  . Alcohol Use: Yes     Comment: 12/24/11 "last alcohol was 2000"    Review of Systems  All other systems reviewed and are negative.     Allergies  Review of patient's allergies indicates no known allergies.  Home Medications   Prior to Admission medications   Medication Sig Start Date End Date Taking? Authorizing Provider  albuterol (PROVENTIL HFA;VENTOLIN HFA) 108 (90 BASE) MCG/ACT inhaler Inhale 1-2 puffs into the lungs every 6 (six) hours as needed for wheezing or shortness of breath.   Yes Historical Provider, MD  albuterol (PROVENTIL) (2.5 MG/3ML) 0.083% nebulizer solution Take 3-6 mLs (2.5-5 mg total) by nebulization every 4 (four) hours as needed for wheezing or shortness of breath. 03/12/15  Yes John Molpus, MD  amoxicillin (AMOXIL) 500 MG capsule Take 2 capsules (1,000 mg total) by mouth 2 (two) times daily. 03/05/15  Yes Shari Upstill, PA-C  Aspirin-Acetaminophen-Caffeine (GOODY HEADACHE PO) Take 1 packet by mouth every 6 (six) hours as needed (for pain.).   Yes Historical Provider, MD  chlorpheniramine-HYDROcodone (TUSSIONEX PENNKINETIC ER) 10-8 MG/5ML SUER Take 5 mLs by mouth at bedtime as needed for cough. 03/05/15  Yes Shari Upstill, PA-C  ibuprofen (ADVIL,MOTRIN) 200 MG tablet Take 800 mg by mouth every 6 (six) hours as needed for moderate pain.   Yes Historical Provider, MD  lisinopril-hydrochlorothiazide (PRINZIDE,ZESTORETIC) 10-12.5 MG per tablet Take 1 tablet by mouth daily.   Yes Historical Provider, MD  omeprazole (PRILOSEC) 20 MG capsule Take 20 mg by mouth daily.   Yes Historical Provider, MD  guaiFENesin-codeine 100-10 MG/5ML syrup Take 5 mLs by mouth 3 (three) times daily as needed for cough. Patient not taking: Reported on 03/20/2015 03/18/15   Comer Locket, PA-C  ibuprofen (ADVIL,MOTRIN) 600 MG tablet Take 1 tablet (600 mg total) by mouth every 6 (six) hours as needed. Patient not taking: Reported  on 03/18/2015 10/07/14   Virgel Manifold, MD   BP 134/57 mmHg  Pulse 109  Temp(Src) 98.3 F (36.8 C) (Oral)  Resp 24  SpO2 96% Physical Exam  Constitutional: He is oriented to person, place, and time. He appears well-developed.  He appears older than stated age, and under nourished.  HENT:  Head: Normocephalic and atraumatic.  Right Ear: External ear normal.  Left Ear: External ear normal.  Eyes: Conjunctivae and EOM are normal. Pupils are equal, round, and reactive to light.  Neck: Normal range of motion and phonation normal. Neck supple.  Cardiovascular: Normal rate, regular rhythm and normal heart sounds.   Pulmonary/Chest: Effort normal. He exhibits no bony tenderness.  Increased work of breathing with intercostal retractions. Persistent cough which is nonproductive. Decreased air movement bilateral. Scattered wheezing on expiration. No rales.  Abdominal: Soft. There is no tenderness.  Musculoskeletal: Normal range of motion.  Neurological: He is alert and oriented to person, place, and time. No cranial nerve deficit or sensory deficit. He exhibits normal muscle tone. Coordination normal.  Skin: Skin is warm, dry and intact. No rash noted. No erythema.  Psychiatric: He has a normal mood and affect. His behavior is normal. Judgment and thought content normal.  Nursing note and vitals reviewed.   ED Course  Procedures (including critical care time)  Medications  0.9 %  sodium chloride infusion ( Intravenous Stopped 03/20/15 1947)  albuterol (PROVENTIL HFA;VENTOLIN HFA) 108 (90 BASE) MCG/ACT inhaler 2 puff (not administered)  aerochamber Z-Stat Plus/medium 1 each (not administered)  albuterol (PROVENTIL) (2.5 MG/3ML) 0.083% nebulizer solution 5 mg (5 mg Nebulization Given 03/20/15 1516)  ipratropium-albuterol (DUONEB) 0.5-2.5 (3) MG/3ML nebulizer solution 3 mL (3 mLs Nebulization Given 03/20/15 1517)  fentaNYL (SUBLIMAZE) injection 50 mcg (50 mcg Intravenous Given 03/20/15 1754)   ondansetron (ZOFRAN) injection 4 mg (4 mg Intravenous Given 03/20/15 1755)  albuterol (PROVENTIL,VENTOLIN) solution continuous neb (15 mg Nebulization Given 03/20/15 1759)  methylPREDNISolone sodium succinate (SOLU-MEDROL) 125 mg/2 mL injection 125 mg (125 mg Intravenous Given 03/20/15 1755)  sodium chloride 0.9 % bolus 500 mL (0 mLs Intravenous Stopped 03/20/15 1947)    Patient Vitals for the past 24 hrs:  BP Temp Temp src Pulse Resp SpO2  03/20/15 2241 134/57 mmHg 98.3 F (36.8 C) Oral 109 24 96 %  03/20/15 1921 143/80 mmHg 98.5 F (36.9 C) Oral 114 24 94 %  03/20/15 1812 140/72 mmHg - - 111 20 97 %  03/20/15 1759 - - - - - 95 %  03/20/15 1600 127/82 mmHg - - 99 23 93 %  03/20/15 1530 143/88 mmHg - - 100 21 99 %  03/20/15 1510 136/77 mmHg 97.4 F (36.3 C) Oral 110 (!) 32 96 %    9:22 PM Reevaluation with update and discussion. After initial assessment and treatment, an updated evaluation reveals he is more comfortable now states that his cough is almost gone. Respiratory rate has improved. Lungs have improved air movement with somewhat diminished air movement at the bases, but no out of the wheezes, Rales or rhonchi. There is no  increased work of breathing. Nasal cannula oxygen removed as a trial. Jada Kuhnert L   10:45 PM- he is able to cannulate with normal oxygenation and mild tachycardia. He felt well while walking. Findings discussed with the patient, all questions were answered.  CRITICAL CARE Performed by: Richarda Blade Total critical care time: 35 minutes Critical care time was exclusive of separately billable procedures and treating other patients. Critical care was necessary to treat or prevent imminent or life-threatening deterioration. Critical care was time spent personally by me on the following activities: development of treatment plan with patient and/or surrogate as well as nursing, discussions with consultants, evaluation of patient's response to treatment, examination  of patient, obtaining history from patient or surrogate, ordering and performing treatments and interventions, ordering and review of laboratory studies, ordering and review of radiographic studies, pulse oximetry and re-evaluation of patient's condition.   Labs Review Labs Reviewed  BASIC METABOLIC PANEL - Abnormal; Notable for the following:    Sodium 134 (*)    Glucose, Bld 124 (*)    Calcium 8.8 (*)    All other components within normal limits  CBC - Abnormal; Notable for the following:    RBC 4.07 (*)    HCT 37.6 (*)    All other components within normal limits  I-STAT TROPOININ, ED    Imaging Review Dg Chest 2 View (if Patient Has Fever And/or Copd)  03/20/2015   CLINICAL DATA:  64 year old male with shortness of breath and productive cough  EXAM: CHEST  2 VIEW  COMPARISON:  CT dated 03/18/2015  FINDINGS: Two views of the chest demonstrate emphysematous changes of the lungs. No focal consolidation, pleural effusion, or pneumothorax. Subcentimeter nodular appearing densities over the lower lung fields bilaterally correspond to the nipples. The cardiomediastinal silhouette is within normal limits. The visualized osseous structures are grossly unremarkable.  IMPRESSION: Emphysema.  No focal consolidation.   Electronically Signed   By: Anner Crete M.D.   On: 03/20/2015 16:16     EKG Interpretation   Date/Time:  Wednesday March 20 2015 15:09:15 EDT Ventricular Rate:  109 PR Interval:  159 QRS Duration: 78 QT Interval:  337 QTC Calculation: 454 R Axis:   85 Text Interpretation:  Sinus tachycardia Biatrial enlargement Borderline  right axis deviation Anteroseptal infarct, age indeterminate Baseline  wander in lead(s) V4 since last tracing no significant change Confirmed by  [facility]  MD, Tarae Wooden [phone]) on 03/20/2015 4:59:34 PM      MDM   Final diagnoses:  Acute bronchitis, unspecified organism  Tobacco abuse    Bronchitis in smoker, with persistent symptoms. Doubt  pneumonia, ACS, metabolic instability or impending vascular collapse.   Nursing Notes Reviewed/ Care Coordinated Applicable Imaging Reviewed Interpretation of Laboratory Data incorporated into ED treatment  The patient appears reasonably screened and/or stabilized for discharge and I doubt any other medical condition or other [facility] requiring further screening, evaluation, or treatment in the ED at this time prior to discharge.  Plan: Home Medications- Albuterol, Prednisone, Z-Pack; Home Treatments- Stop smoking; return here if the recommended treatment, does not improve the symptoms; Recommended follow up- PCP of choice asap     Daleen Bo, MD 03/20/15 2252

## 2015-03-20 NOTE — ED Notes (Signed)
Pt. Ambulated down the hall and back to his room. Pt. Gait steady on his feet. Pt. Ambulated 93% room air and heart rate 112. Pt. Has no complaints at this time.

## 2015-03-29 ENCOUNTER — Encounter (HOSPITAL_COMMUNITY): Payer: Self-pay | Admitting: Emergency Medicine

## 2015-03-29 ENCOUNTER — Emergency Department (HOSPITAL_COMMUNITY)
Admission: EM | Admit: 2015-03-29 | Discharge: 2015-03-29 | Disposition: A | Discharge status: Home / Self Care | Payer: Non-veteran care | Attending: Emergency Medicine | Admitting: Emergency Medicine

## 2015-03-29 ENCOUNTER — Emergency Department (HOSPITAL_COMMUNITY): Payer: Non-veteran care

## 2015-03-29 DIAGNOSIS — J439 Emphysema, unspecified: Secondary | ICD-10-CM | POA: Insufficient documentation

## 2015-03-29 DIAGNOSIS — Z79899 Other long term (current) drug therapy: Secondary | ICD-10-CM | POA: Diagnosis not present

## 2015-03-29 DIAGNOSIS — R05 Cough: Secondary | ICD-10-CM | POA: Diagnosis present

## 2015-03-29 DIAGNOSIS — I10 Essential (primary) hypertension: Secondary | ICD-10-CM | POA: Diagnosis not present

## 2015-03-29 DIAGNOSIS — Z792 Long term (current) use of antibiotics: Secondary | ICD-10-CM | POA: Diagnosis not present

## 2015-03-29 DIAGNOSIS — R000 Tachycardia, unspecified: Secondary | ICD-10-CM | POA: Diagnosis not present

## 2015-03-29 DIAGNOSIS — R059 Cough, unspecified: Secondary | ICD-10-CM

## 2015-03-29 LAB — COMPREHENSIVE METABOLIC PANEL
ALBUMIN: 3.2 g/dL — AB (ref 3.5–5.0)
ALT: 39 U/L (ref 17–63)
AST: 32 U/L (ref 15–41)
Alkaline Phosphatase: 52 U/L (ref 38–126)
Anion gap: 9 (ref 5–15)
BILIRUBIN TOTAL: 0.1 mg/dL — AB (ref 0.3–1.2)
BUN: 9 mg/dL (ref 6–20)
CO2: 22 mmol/L (ref 22–32)
CREATININE: 0.8 mg/dL (ref 0.61–1.24)
Calcium: 9 mg/dL (ref 8.9–10.3)
Chloride: 105 mmol/L (ref 101–111)
GFR calc Af Amer: >60 mL/min (ref >60)
GFR calc non Af Amer: >60 mL/min (ref >60)
Glucose, Bld: 126 mg/dL — ABNORMAL HIGH (ref 65–99)
Potassium: 3.7 mmol/L (ref 3.5–5.1)
Sodium: 136 mmol/L (ref 135–145)
Total Protein: 6.8 g/dL (ref 6.5–8.1)

## 2015-03-29 LAB — CBC
HCT: 37 % — ABNORMAL LOW (ref 39.0–52.0)
Hemoglobin: 12.9 g/dL — ABNORMAL LOW (ref 13.0–17.0)
MCH: 32.4 pg (ref 26.0–34.0)
MCHC: 34.9 g/dL (ref 30.0–36.0)
MCV: 93 fL (ref 78.0–100.0)
PLATELETS: 363 10*3/uL (ref 150–400)
RBC: 3.98 MIL/uL — AB (ref 4.22–5.81)
RDW: 13.1 % (ref 11.5–15.5)
WBC: 9 10*3/uL (ref 4.0–10.5)

## 2015-03-29 MED ORDER — BENZONATATE 100 MG PO CAPS
200.0000 mg | ORAL_CAPSULE | Freq: Once | ORAL | Status: AC
  Administered 2015-03-29: 200 mg via ORAL
  Filled 2015-03-29: qty 2

## 2015-03-29 MED ORDER — ACETAMINOPHEN-CODEINE #3 300-30 MG PO TABS
2.0000 | ORAL_TABLET | Freq: Once | ORAL | Status: AC
  Administered 2015-03-29: 2 via ORAL
  Filled 2015-03-29: qty 2

## 2015-03-29 MED ORDER — ALBUTEROL (5 MG/ML) CONTINUOUS INHALATION SOLN
10.0000 mg/h | INHALATION_SOLUTION | RESPIRATORY_TRACT | Status: DC
  Administered 2015-03-29: 10 mg/h via RESPIRATORY_TRACT
  Filled 2015-03-29: qty 20

## 2015-03-29 MED ORDER — IPRATROPIUM BROMIDE 0.02 % IN SOLN
1.0000 mg | Freq: Once | RESPIRATORY_TRACT | Status: AC
Start: 2015-03-29 — End: 2015-03-29
  Administered 2015-03-29: 1 mg via RESPIRATORY_TRACT
  Filled 2015-03-29: qty 5

## 2015-03-29 MED ORDER — METHYLPREDNISOLONE SODIUM SUCC 125 MG IJ SOLR
125.0000 mg | Freq: Once | INTRAMUSCULAR | Status: AC
  Administered 2015-03-29: 125 mg via INTRAVENOUS
  Filled 2015-03-29: qty 2

## 2015-03-29 MED ORDER — BENZONATATE 200 MG PO CAPS: 200.0000 mg | ORAL_CAPSULE | Freq: Three times a day (TID) | ORAL | Status: DC | PRN

## 2015-03-29 MED ORDER — MAGNESIUM SULFATE 2 GM/50ML IV SOLN
2.0000 g | Freq: Once | INTRAVENOUS | Status: AC
  Administered 2015-03-29: 2 g via INTRAVENOUS
  Filled 2015-03-29: qty 50

## 2015-03-29 MED ORDER — IPRATROPIUM-ALBUTEROL 0.5-2.5 (3) MG/3ML IN SOLN
3.0000 mL | Freq: Once | RESPIRATORY_TRACT | Status: AC
Start: 2015-03-29 — End: 2015-03-29
  Administered 2015-03-29: 3 mL via RESPIRATORY_TRACT
  Filled 2015-03-29: qty 3

## 2015-03-29 NOTE — ED Provider Notes (Signed)
CSN: [id number]     Arrival date & time 03/29/15  0013 History  This chart was scribed for Everlene Balls, MD by Evelene Croon, ED Scribe. This patient was seen in room D35C/D35C and the patient's care was started 1:35 AM.    Chief Complaint  Patient presents with  . Shortness of Breath  . Cough  LEVEL 5 CAVEAT DUE TO Acuity of condition    The history is provided by the patient. No language interpreter was used.     HPI Comments:  [name] [name] is a 64 y.o. male with a history of emphysema who presents to the Emergency Department complaining of SOB and constant cough for 1 month. His cough is occasionally productive with white sputum. He denies fever. Pt states he does not smoke cigarettes but smokes marijuana. Pt used breathing treatment PTA; notes he ran out of his inhaler.    Past Medical History  Diagnosis Date  . HTN (hypertension)   . Bradycardia, sinus 12/24/11  . Syncope and collapse 12/24/11    2/2 hypotension in the setting of dehydration  . Hypotension 12/24/11    in the setting of dehydration   . Abnormal EKG 12/24/11    anteroseptal and lateral ST elevation, felt r/t early repolarization;  Cardiac cath 12/24/11 - normal coronary anatomy, EF 55-65%  . Marijuana use   . History of alcohol abuse     hospitalized for detox 2002   Past Surgical History  Procedure Laterality Date  . Circumcision  1972  . Laceration repair  02/2004    arthroscopic debridement of triagular fibrocartilage tear/E-chart; right wrist  . Cardiac catheterization  12/24/11    normal coronary anatomy, EF 55-65%  . Left heart catheterization with coronary angiogram N/A 12/24/2011    Procedure: LEFT HEART CATHETERIZATION WITH CORONARY ANGIOGRAM;  Surgeon: Peter M Martinique, MD;  Location: [facility] CATH LAB;  Service: Cardiovascular;  Laterality: N/A;   No family history on file. History  Substance Use Topics  . Smoking status: Never Smoker   . Smokeless tobacco: Never Used  . Alcohol Use: Yes     Comment: "     Review of Systems  Constitutional: Negative for fever.  Respiratory: Positive for cough and shortness of breath.   Unable to fully obtain ROS due to acuity of medical condition   Allergies  Review of patient's allergies indicates no known allergies.  Home Medications   Prior to Admission medications   Medication Sig Start Date End Date Taking? Authorizing Provider  albuterol (PROVENTIL HFA;VENTOLIN HFA) 108 (90 BASE) MCG/ACT inhaler Inhale 1-2 puffs into the lungs every 6 (six) hours as needed for wheezing or shortness of breath.    Historical Provider, MD  albuterol (PROVENTIL) (2.5 MG/3ML) 0.083% nebulizer solution Take 3-6 mLs (2.5-5 mg total) by nebulization every 4 (four) hours as needed for wheezing or shortness of breath. 03/12/15   Shanon Rosser, MD  amoxicillin (AMOXIL) 500 MG capsule Take 2 capsules (1,000 mg total) by mouth 2 (two) times daily. 03/05/15   Charlann Lange, PA-C  Aspirin-Acetaminophen-Caffeine (GOODY HEADACHE PO) Take 1 packet by mouth every 6 (six) hours as needed (for pain.).    Historical Provider, MD  azithromycin (ZITHROMAX Z-PAK) 250 MG tablet 2 po day one, then 1 daily x 4 days 03/20/15   Daleen Bo, MD  chlorpheniramine-HYDROcodone [facility]) 10-8 MG/5ML SUER Take 5 mLs by mouth at bedtime as needed for cough. 03/05/15   Charlann Lange, PA-C  guaiFENesin-codeine 100-10 MG/5ML syrup Take 5  mLs by mouth 3 (three) times daily as needed for cough. Patient not taking: Reported on 03/20/2015 03/18/15   Comer Locket, PA-C  ibuprofen (ADVIL,MOTRIN) 200 MG tablet Take 800 mg by mouth every 6 (six) hours as needed for moderate pain.    Historical Provider, MD  ibuprofen (ADVIL,MOTRIN) 600 MG tablet Take 1 tablet (600 mg total) by mouth every 6 (six) hours as needed. Patient not taking: Reported on 03/18/2015 10/07/14   Virgel Manifold, MD  lisinopril-hydrochlorothiazide (PRINZIDE,ZESTORETIC) 10-12.5 MG per tablet Take 1 tablet by mouth daily.    Historical  Provider, MD  omeprazole (PRILOSEC) 20 MG capsule Take 20 mg by mouth daily.    Historical Provider, MD  predniSONE (DELTASONE) 10 MG tablet Take q day 6,5,4,3,2,1 03/20/15   Daleen Bo, MD   BP 143/82 mmHg  Pulse 106  Temp(Src) 98 F (36.7 C) (Oral)  Resp 18  SpO2 95% Physical Exam  Constitutional: He is oriented to person, place, and time. Vital signs are normal. He appears well-developed and well-nourished.  Non-toxic appearance. He does not appear ill. No distress.  HENT:  Head: Normocephalic and atraumatic.  Nose: Nose normal.  Mouth/Throat: Oropharynx is clear and moist. No oropharyngeal exudate.  Eyes: Conjunctivae and EOM are normal. Pupils are equal, round, and reactive to light. No scleral icterus.  Neck: Normal range of motion. Neck supple. No tracheal deviation, no edema, no erythema and normal range of motion present. No thyroid mass and no thyromegaly present.  Cardiovascular: Regular rhythm, S1 normal, S2 normal, normal heart sounds, intact distal pulses and normal pulses.  Tachycardia present.  Exam reveals no gallop and no friction rub.   No murmur heard. Pulses:      Radial pulses are 2+ on the right side, and 2+ on the left side.       Dorsalis pedis pulses are 2+ on the right side, and 2+ on the left side.  Pulmonary/Chest: Effort normal and breath sounds normal. Tachypnea noted. No respiratory distress. He has no wheezes. He has no rhonchi. He has no rales.  Frequent coughing noted Tachypnic  Abdominal: Soft. Normal appearance and bowel sounds are normal. He exhibits no distension, no ascites and no mass. There is no hepatosplenomegaly. There is no tenderness. There is no rebound, no guarding and no CVA tenderness.  Musculoskeletal: Normal range of motion. He exhibits no edema or tenderness.  Lymphadenopathy:    He has no cervical adenopathy.  Neurological: He is alert and oriented to person, place, and time. He has normal strength. No cranial nerve deficit or  sensory deficit.  Skin: Skin is warm, dry and intact. No petechiae and no rash noted. He is not diaphoretic. No erythema. No pallor.  Psychiatric: He has a normal mood and affect. His behavior is normal. Judgment normal.  Nursing note and vitals reviewed.   ED Course  Procedures   DIAGNOSTIC STUDIES:  Oxygen Saturation is 95% on RA, adequate by my interpretation.    COORDINATION OF CARE:  1:40 AM Will order breathing treatment. Discussed treatment plan with pt at bedside and pt agreed to plan.  Labs Review Labs Reviewed  COMPREHENSIVE METABOLIC PANEL  CBC WITH DIFFERENTIAL/PLATELET    Imaging Review Dg Chest 2 View  03/29/2015   CLINICAL DATA:  Shortness of breath with productive cough, wheezing, and chest congestion over 2 weeks.  EXAM: CHEST  2 VIEW  COMPARISON:  03/20/2015  FINDINGS: Mild hyperinflation. The heart size and mediastinal contours are within normal limits. Both lungs are  clear. Nodular opacity projected over the mid lungs probably due to prominent nipple shadows and unchanged since prior study. The visualized skeletal structures are unremarkable.  IMPRESSION: No active cardiopulmonary disease.   Electronically Signed   By: Lucienne Capers M.D.   On: 03/29/2015 01:46     EKG Interpretation None      MDM   Final diagnoses:  None   Patient presents to emergency department for one month of shortness of breath and coughing. He states he has a history of emphysema and has run out of his inhaler. He has been seen in emergency department twice in the last month for the same. Patient was order continue albuterol treatment, Steris, magnesium emergency department. Also gave Tylenol 3 and Tessalon Perles for his cough. Chest x-ray is negative for pneumonia. Patient states he does smoke marijuana which may be causing his chronic cough. I do not believe and antibiotics are warranted at this time.  Chest x-ray negative for pneumonia. Patient feels better after treatment.  Coughing has resolved. He is advised to stop smoking marijuana. We'll discharge home with [employer] Primary care follow-up is advised, he appears well in no acute distress. His vital signs remain within his normal limits and he is safe for discharge.  I personally performed the services described in this documentation, which was scribed in my presence. The recorded information has been reviewed and is accurate.   Everlene Balls, MD 03/29/15 [phone]

## 2015-03-29 NOTE — ED Notes (Signed)
Respiratory therapist at the bedside.

## 2015-03-29 NOTE — ED Notes (Signed)
Notified respiratory in regards to peak flow order. They acnowledge.

## 2015-03-29 NOTE — ED Notes (Signed)
Paper copy of lab results given to Dr. Claudine Mouton.

## 2015-03-29 NOTE — Discharge Instructions (Signed)
Cough, Adult [name] [name], take medication as prescribed for your cough. Only use as needed. Refrain from smoking marijuana as this may make your cough worse. See a primary care physician within 3 days for close follow-up. If symptoms worsen come back to the emergency department immediately. Thank you.  A cough is a reflex. It helps you clear your throat and airways. A cough can help heal your body. A cough can last 2 or 3 weeks (acute) or may last more than 8 weeks (chronic). Some common causes of a cough can include an infection, allergy, or a cold. HOME CARE  Only take medicine as told by your doctor.  If given, take your medicines (antibiotics) as told. Finish them even if you start to feel better.  Use a cold steam vaporizer or humidifier in your home. This can help loosen thick spit (secretions).  Sleep so you are almost sitting up (semi-upright). Use pillows to do this. This helps reduce coughing.  Rest as needed.  Stop smoking if you smoke. GET HELP RIGHT AWAY IF:  You have yellowish-white fluid (pus) in your thick spit.  Your cough gets worse.  Your medicine does not reduce coughing, and you are losing sleep.  You cough up blood.  You have trouble breathing.  Your pain gets worse and medicine does not help.  You have a fever. MAKE SURE YOU:   Understand these instructions.  Will watch your condition.  Will get help right away if you are not doing well or get worse. Document Released: 05/14/2011 Document Revised: 01/15/2014 Document Reviewed: 05/14/2011 [facility] Patient Information 2015 [place], [place]. This information is not intended to replace advice given to you by your health care provider. Make sure you discuss any questions you have with your health care provider.

## 2015-03-29 NOTE — ED Notes (Signed)
Pt. reports SOB with productive cough , wheezing and chest congestion onset this evening .

## 2015-04-02 ENCOUNTER — Emergency Department (HOSPITAL_COMMUNITY): Payer: Non-veteran care

## 2015-04-02 ENCOUNTER — Inpatient Hospital Stay (HOSPITAL_COMMUNITY)
Admission: EM | Admit: 2015-04-02 | Discharge: 2015-04-03 | DRG: 191 | Disposition: A | Discharge status: Home / Self Care | Payer: Non-veteran care | Attending: Internal Medicine | Admitting: Internal Medicine

## 2015-04-02 ENCOUNTER — Encounter (HOSPITAL_COMMUNITY): Payer: Self-pay

## 2015-04-02 DIAGNOSIS — E46 Unspecified protein-calorie malnutrition: Secondary | ICD-10-CM | POA: Diagnosis present

## 2015-04-02 DIAGNOSIS — F1210 Cannabis abuse, uncomplicated: Secondary | ICD-10-CM | POA: Diagnosis not present

## 2015-04-02 DIAGNOSIS — I10 Essential (primary) hypertension: Secondary | ICD-10-CM | POA: Diagnosis not present

## 2015-04-02 DIAGNOSIS — J441 Chronic obstructive pulmonary disease with (acute) exacerbation: Secondary | ICD-10-CM | POA: Diagnosis not present

## 2015-04-02 DIAGNOSIS — K219 Gastro-esophageal reflux disease without esophagitis: Secondary | ICD-10-CM | POA: Diagnosis present

## 2015-04-02 DIAGNOSIS — R001 Bradycardia, unspecified: Secondary | ICD-10-CM | POA: Diagnosis present

## 2015-04-02 DIAGNOSIS — Z6822 Body mass index (BMI) 22.0-22.9, adult: Secondary | ICD-10-CM

## 2015-04-02 DIAGNOSIS — Z79899 Other long term (current) drug therapy: Secondary | ICD-10-CM | POA: Diagnosis not present

## 2015-04-02 DIAGNOSIS — F1290 Cannabis use, unspecified, uncomplicated: Secondary | ICD-10-CM | POA: Diagnosis present

## 2015-04-02 DIAGNOSIS — R062 Wheezing: Secondary | ICD-10-CM | POA: Diagnosis present

## 2015-04-02 LAB — CBC WITH DIFFERENTIAL/PLATELET
BASOS ABS: 0 10*3/uL (ref 0.0–0.1)
Basophils Relative: 0 % (ref 0–1)
Eosinophils Absolute: 2.7 10*3/uL — ABNORMAL HIGH (ref 0.0–0.7)
Eosinophils Relative: 29 % — ABNORMAL HIGH (ref 0–5)
HCT: 38.4 % — ABNORMAL LOW (ref 39.0–52.0)
Hemoglobin: 13.3 g/dL (ref 13.0–17.0)
Lymphocytes Relative: 20 % (ref 12–46)
Lymphs Abs: 1.8 10*3/uL (ref 0.7–4.0)
MCH: 32.6 pg (ref 26.0–34.0)
MCHC: 34.6 g/dL (ref 30.0–36.0)
MCV: 94.1 fL (ref 78.0–100.0)
Monocytes Absolute: 0.7 10*3/uL (ref 0.1–1.0)
Monocytes Relative: 8 % (ref 3–12)
NEUTROS ABS: 4 10*3/uL (ref 1.7–7.7)
Neutrophils Relative %: 43 % (ref 43–77)
Platelets: 338 10*3/uL (ref 150–400)
RBC: 4.08 MIL/uL — ABNORMAL LOW (ref 4.22–5.81)
RDW: 13.7 % (ref 11.5–15.5)
WBC: 9.2 10*3/uL (ref 4.0–10.5)

## 2015-04-02 LAB — BLOOD GAS, ARTERIAL
ACID-BASE EXCESS: 0.8 mmol/L (ref 0.0–2.0)
Bicarbonate: 24.7 mEq/L — ABNORMAL HIGH (ref 20.0–24.0)
Drawn by: 235321
O2 CONTENT: 2 L/min
O2 SAT: 95.8 %
PO2 ART: 90.9 mmHg (ref 80.0–100.0)
Patient temperature: 99.1
TCO2: 22 mmol/L (ref 0–100)
pCO2 arterial: 39.1 mmHg (ref 35.0–45.0)
pH, Arterial: 7.418 (ref 7.350–7.450)

## 2015-04-02 LAB — BASIC METABOLIC PANEL
Anion gap: 6 (ref 5–15)
BUN: 15 mg/dL (ref 6–20)
CALCIUM: 9 mg/dL (ref 8.9–10.3)
CHLORIDE: 105 mmol/L (ref 101–111)
CO2: 23 mmol/L (ref 22–32)
CREATININE: 0.73 mg/dL (ref 0.61–1.24)
GFR calc non Af Amer: >60 mL/min (ref >60)
Glucose, Bld: 118 mg/dL — ABNORMAL HIGH (ref 65–99)
Potassium: 4.4 mmol/L (ref 3.5–5.1)
SODIUM: 134 mmol/L — AB (ref 135–145)

## 2015-04-02 LAB — BRAIN NATRIURETIC PEPTIDE: B Natriuretic Peptide: 9.7 pg/mL (ref 0.0–100.0)

## 2015-04-02 MED ORDER — ACETAMINOPHEN 325 MG PO TABS
650.0000 mg | ORAL_TABLET | Freq: Four times a day (QID) | ORAL | Status: DC | PRN
  Administered 2015-04-03: 650 mg via ORAL
  Filled 2015-04-02: qty 2

## 2015-04-02 MED ORDER — MAGNESIUM SULFATE 2 GM/50ML IV SOLN: 2.0000 g | INTRAVENOUS | Status: DC

## 2015-04-02 MED ORDER — HEPARIN SODIUM (PORCINE) 5000 UNIT/ML IJ SOLN
5000.0000 [IU] | Freq: Three times a day (TID) | INTRAMUSCULAR | Status: DC
  Administered 2015-04-02 – 2015-04-03 (×3): 5000 [IU] via SUBCUTANEOUS
  Filled 2015-04-02 (×6): qty 1

## 2015-04-02 MED ORDER — BENZONATATE 100 MG PO CAPS
200.0000 mg | ORAL_CAPSULE | Freq: Three times a day (TID) | ORAL | Status: DC | PRN
  Administered 2015-04-02 – 2015-04-03 (×3): 200 mg via ORAL
  Filled 2015-04-02 (×3): qty 2

## 2015-04-02 MED ORDER — IPRATROPIUM-ALBUTEROL 0.5-2.5 (3) MG/3ML IN SOLN
3.0000 mL | Freq: Three times a day (TID) | RESPIRATORY_TRACT | Status: DC
  Administered 2015-04-02 – 2015-04-03 (×3): 3 mL via RESPIRATORY_TRACT
  Filled 2015-04-02 (×2): qty 3

## 2015-04-02 MED ORDER — LEVOFLOXACIN 750 MG PO TABS
750.0000 mg | ORAL_TABLET | Freq: Every day | ORAL | Status: DC
  Administered 2015-04-03: 750 mg via ORAL
  Filled 2015-04-02: qty 1

## 2015-04-02 MED ORDER — LISINOPRIL 10 MG PO TABS
10.0000 mg | ORAL_TABLET | Freq: Every day | ORAL | Status: DC
  Administered 2015-04-02: 10 mg via ORAL
  Filled 2015-04-02: qty 1

## 2015-04-02 MED ORDER — LISINOPRIL-HYDROCHLOROTHIAZIDE 10-12.5 MG PO TABS: 1.0000 | ORAL_TABLET | Freq: Every day | ORAL | Status: DC

## 2015-04-02 MED ORDER — CHLORHEXIDINE GLUCONATE 0.12 % MT SOLN
15.0000 mL | Freq: Two times a day (BID) | OROMUCOSAL | Status: DC
  Administered 2015-04-02 – 2015-04-03 (×3): 15 mL via OROMUCOSAL
  Filled 2015-04-02 (×5): qty 15

## 2015-04-02 MED ORDER — METHYLPREDNISOLONE SODIUM SUCC 125 MG IJ SOLR
125.0000 mg | Freq: Once | INTRAMUSCULAR | Status: AC
  Administered 2015-04-02: 125 mg via INTRAVENOUS
  Filled 2015-04-02: qty 2

## 2015-04-02 MED ORDER — LISINOPRIL 20 MG PO TABS
40.0000 mg | ORAL_TABLET | Freq: Every day | ORAL | Status: DC
  Administered 2015-04-02 – 2015-04-03 (×2): 40 mg via ORAL
  Filled 2015-04-02: qty 2
  Filled 2015-04-02: qty 1

## 2015-04-02 MED ORDER — CETYLPYRIDINIUM CHLORIDE 0.05 % MT LIQD
7.0000 mL | Freq: Two times a day (BID) | OROMUCOSAL | Status: DC
  Administered 2015-04-02: 7 mL via OROMUCOSAL

## 2015-04-02 MED ORDER — ALBUTEROL (5 MG/ML) CONTINUOUS INHALATION SOLN
10.0000 mg/h | INHALATION_SOLUTION | RESPIRATORY_TRACT | Status: AC
  Administered 2015-04-02: 10 mg/h via RESPIRATORY_TRACT
  Filled 2015-04-02: qty 20
  Filled 2015-04-02: qty 40

## 2015-04-02 MED ORDER — GUAIFENESIN-DM 100-10 MG/5ML PO SYRP
5.0000 mL | ORAL_SOLUTION | ORAL | Status: DC | PRN
  Administered 2015-04-02 – 2015-04-03 (×2): 5 mL via ORAL
  Filled 2015-04-02 (×2): qty 10

## 2015-04-02 MED ORDER — IPRATROPIUM BROMIDE 0.02 % IN SOLN
0.5000 mg | Freq: Once | RESPIRATORY_TRACT | Status: AC
  Administered 2015-04-02: 0.5 mg via RESPIRATORY_TRACT
  Filled 2015-04-02: qty 2.5

## 2015-04-02 MED ORDER — LEVOFLOXACIN IN D5W 750 MG/150ML IV SOLN
750.0000 mg | INTRAVENOUS | Status: DC
  Administered 2015-04-02: 750 mg via INTRAVENOUS
  Filled 2015-04-02: qty 150

## 2015-04-02 MED ORDER — METHYLPREDNISOLONE SODIUM SUCC 125 MG IJ SOLR
80.0000 mg | Freq: Three times a day (TID) | INTRAMUSCULAR | Status: DC
  Administered 2015-04-02 – 2015-04-03 (×3): 80 mg via INTRAVENOUS
  Filled 2015-04-02 (×6): qty 1.28

## 2015-04-02 MED ORDER — PANTOPRAZOLE SODIUM 40 MG PO TBEC
40.0000 mg | DELAYED_RELEASE_TABLET | Freq: Every day | ORAL | Status: DC
  Administered 2015-04-02 – 2015-04-03 (×2): 40 mg via ORAL
  Filled 2015-04-02 (×2): qty 1

## 2015-04-02 MED ORDER — HYDROCHLOROTHIAZIDE 12.5 MG PO CAPS
12.5000 mg | ORAL_CAPSULE | Freq: Every day | ORAL | Status: DC
  Administered 2015-04-02: 12.5 mg via ORAL
  Filled 2015-04-02: qty 1

## 2015-04-02 MED ORDER — HYDROCHLOROTHIAZIDE 25 MG PO TABS
25.0000 mg | ORAL_TABLET | Freq: Every day | ORAL | Status: DC
  Administered 2015-04-03: 25 mg via ORAL
  Filled 2015-04-02: qty 1

## 2015-04-02 MED ORDER — ACETAMINOPHEN 650 MG RE SUPP: 650.0000 mg | Freq: Four times a day (QID) | RECTAL | Status: DC | PRN

## 2015-04-02 MED ORDER — IPRATROPIUM-ALBUTEROL 0.5-2.5 (3) MG/3ML IN SOLN
3.0000 mL | RESPIRATORY_TRACT | Status: DC | PRN
  Filled 2015-04-02: qty 3

## 2015-04-02 MED ORDER — HYDRALAZINE HCL 20 MG/ML IJ SOLN: 10.0000 mg | Freq: Four times a day (QID) | INTRAMUSCULAR | Status: DC | PRN

## 2015-04-02 MED FILL — Albuterol Sulfate Soln Nebu 0.5% (5 MG/ML): RESPIRATORY_TRACT | Qty: 20 | Status: AC

## 2015-04-02 NOTE — ED Notes (Signed)
Dr Claudine Mouton gave him Ladona Ridgel on 7/15 for a cough

## 2015-04-02 NOTE — Progress Notes (Signed)
[facility] PROGRESS NOTE  Quinzell Malcomb QVZ:[id number] DOB: [birth year] DOA: 04/02/2015 PCP: PROVIDER NOT IN SYSTEM   Brief narrative 64 year old male with history of COPD with heavy marijuana use, hypertension, remote alcohol use and sinus bradycardia who has had multiple ED visits for the past 2 months for cough with shortness of breath and wheezing. He reports worsening symptoms for the past 2 weeks and running out of his inhaler. He reports using his daughter's nebulizer at home without much relief. Patient admitted for COPD exacerbation.  Assessment/Plan: Acute COPD exacerbation Admitted to medical floor under observation. - scheduled IV Solu-Medrol 80 mg every 8 hours. Continue Bonamine every 4 hours when necessary. Empiric Levaquin. Chest x-ray negative for infection. Recent CT angiogram of the chest done on 7/4 showing emphysematous changes only. Supportive care with Robitussin and Tessalon. Continue albuterol inhaler. He will need Spiriva and atrovent or symbicort upon discharge. Also needs to be prescribed albuterol nebulizer. Check for home O2 needs prior to discharge. -Counseled strongly on marijuana cessation. (Patient reports smoking for past 4 weeks due to worsening symptoms). Also reports almost 10 pound weight loss in past 1-2 months.  Uncontrolled hypertension Increase dose of HCTZ and lisinopril. Add when necessary hydralazine.  GERD Continue PPI  Protein calorie malnutrition Will obtain nutrition consult.  DVT prophylaxis: Subcutaneous heparin  Diet: Heart healthy   Code Status: Full code Family Communication: Daughter at bedside Disposition Plan: Home once clinically improved. Likely in the next 24-48 hours   Consultants:  None  Procedures:  None  Antibiotics:  Levaquin 7/19--  HPI/Subjective: Inpatient seen and examined. Shortness of breath mildly improved but has continuous cough. Admission H&P reviewed.  Objective: Filed Vitals:   04/02/15 0859  BP: 159/88  Pulse: 89  Temp: 98.3 F (36.8 C)  Resp: 18   No intake or output data in the 24 hours ending 04/02/15 1343 Filed Weights   04/02/15 0339 04/02/15 0859  Weight: 58.968 kg (130 lb) 61.9 kg (136 lb 7.4 oz)    Exam:   General:  Middle aged thin built male in no acute distress  HEENT: No pallor, moist oral mucosa, supple neck  Chest: Scattered rhonchi bilaterally  CVS: Normal S1 and S2, no murmurs rub or gallop  GI: Soft, nondistended, nontender, bowel sounds present  Musculoskeletal: Warm, no edema  CNS: Alert and oriented    Data Reviewed: Basic Metabolic Panel:  Recent Labs Lab 03/29/15 0031 04/02/15 0352  NA 136 134*  K 3.7 4.4  CL 105 105  CO2 22 23  GLUCOSE 126* 118*  BUN 9 15  CREATININE 0.80 0.73  CALCIUM 9.0 9.0   Liver Function Tests:  Recent Labs Lab 03/29/15 0031  AST 32  ALT 39  ALKPHOS 52  BILITOT 0.1*  PROT 6.8  ALBUMIN 3.2*   No results for input(s): LIPASE, AMYLASE in the last 168 hours. No results for input(s): AMMONIA in the last 168 hours. CBC:  Recent Labs Lab 03/29/15 0031 04/02/15 0352  WBC 9.0 9.2  NEUTROABS  --  4.0  HGB 12.9* 13.3  HCT 37.0* 38.4*  MCV 93.0 94.1  PLT 363 338   Cardiac Enzymes: No results for input(s): CKTOTAL, CKMB, CKMBINDEX, TROPONINI in the last 168 hours. BNP (last 3 results)  Recent Labs  04/02/15 0352  BNP 9.7    ProBNP (last 3 results) No results for input(s): PROBNP in the last 8760 hours.  CBG: No results for input(s): GLUCAP in the last 168 hours.  No results found  for this or any previous visit (from the past 240 hour(s)).   Studies: Dg Chest Port 1 View  04/02/2015   CLINICAL DATA:  Acute onset of shortness of breath. Initial encounter.  EXAM: PORTABLE CHEST - 1 VIEW  COMPARISON:  Chest radiograph performed 03/29/2015, and CTA of the chest performed 03/18/2015  FINDINGS: The lungs are well-aerated. Mild peribronchial thickening is noted. A 1.2 cm  nodular density at the right midlung zone reflects the patient's nipple, on correlation with prior CTA. There is no evidence of focal opacification, pleural effusion or pneumothorax.  The cardiomediastinal silhouette is within normal limits. No acute osseous abnormalities are seen.  IMPRESSION: Mild peribronchial thickening noted; lungs otherwise clear.   Electronically Signed   By: Garald Balding M.D.   On: 04/02/2015 04:33    Scheduled Meds: . antiseptic oral rinse  7 mL Mouth Rinse q12n4p  . chlorhexidine  15 mL Mouth Rinse BID  . heparin  5,000 Units Subcutaneous 3 times per day  . hydrochlorothiazide  25 mg Oral Daily  . levofloxacin (LEVAQUIN) IV  750 mg Intravenous Q24H  . methylPREDNISolone (SOLU-MEDROL) injection  80 mg Intravenous Q8H  . pantoprazole  40 mg Oral Daily   Continuous Infusions:     Time spent: 25 minutes    Dianara Smullen, [place]  [facility] Pager [phone]. If 7PM-7AM, please contact night-coverage at [email or web address], password [facility] 04/02/2015, 1:43 PM

## 2015-04-02 NOTE — ED Provider Notes (Signed)
CSN: [id number]     Arrival date & time 04/02/15  0330 History   First MD Initiated Contact with Patient 04/02/15 0346     Chief Complaint  Patient presents with  . Shortness of Breath     (Consider location/radiation/quality/duration/timing/severity/associated sxs/prior Treatment) HPI Comments: Patient is a 64 y.o. male with a history of emphysema who presents to the Emergency Department complaining of SOB and constant cough that became acutely worse this morning. He states he still out of his inhaler. Feel similar to previous visit on July 15. No modifying factors identified.  LEVEL 5 CAVEAT DUE TO Acuity of condition   Patient is a 64 y.o. male presenting with shortness of breath. The history is provided by the patient.  Shortness of Breath Severity:  Severe Onset quality:  Sudden Progression:  Worsening Chronicity:  Recurrent Relieved by:  Nothing Worsened by:  Deep breathing, activity, coughing and exertion Ineffective treatments:  None tried (patient is still out of his inhaler) Associated symptoms: cough and sputum production   Associated symptoms: no abdominal pain, no chest pain and no fever     Past Medical History  Diagnosis Date  . HTN (hypertension)   . Bradycardia, sinus 12/24/11  . Syncope and collapse 12/24/11    2/2 hypotension in the setting of dehydration  . Hypotension 12/24/11    in the setting of dehydration   . Abnormal EKG 12/24/11    anteroseptal and lateral ST elevation, felt r/t early repolarization;  Cardiac cath 12/24/11 - normal coronary anatomy, EF 55-65%  . Marijuana use   . History of alcohol abuse     hospitalized for detox 2002   Past Surgical History  Procedure Laterality Date  . Circumcision  1972  . Laceration repair  02/2004    arthroscopic debridement of triagular fibrocartilage tear/E-chart; right wrist  . Cardiac catheterization  12/24/11    normal coronary anatomy, EF 55-65%  . Left heart catheterization with coronary angiogram N/A  12/24/2011    Procedure: LEFT HEART CATHETERIZATION WITH CORONARY ANGIOGRAM;  Surgeon: Peter M Martinique, MD;  Location: [facility] CATH LAB;  Service: Cardiovascular;  Laterality: N/A;   History reviewed. No pertinent family history. History  Substance Use Topics  . Smoking status: Never Smoker   . Smokeless tobacco: Never Used  . Alcohol Use: Yes     Comment: "    Review of Systems  Constitutional: Negative for fever.  Respiratory: Positive for cough, sputum production and shortness of breath.   Cardiovascular: Negative for chest pain.  Gastrointestinal: Negative for abdominal pain.  All other systems reviewed and are negative.     Allergies  Review of patient's allergies indicates no known allergies.  Home Medications   Prior to Admission medications   Medication Sig Start Date End Date Taking? Authorizing Provider  albuterol (PROVENTIL HFA;VENTOLIN HFA) 108 (90 BASE) MCG/ACT inhaler Inhale 1-2 puffs into the lungs every 6 (six) hours as needed for wheezing or shortness of breath.   Yes Historical Provider, MD  albuterol (PROVENTIL) (2.5 MG/3ML) 0.083% nebulizer solution Take 3-6 mLs (2.5-5 mg total) by nebulization every 4 (four) hours as needed for wheezing or shortness of breath. 03/12/15  Yes John Molpus, MD  Aspirin-Acetaminophen-Caffeine (GOODY HEADACHE PO) Take 1 packet by mouth every 6 (six) hours as needed (for pain.).   Yes Historical Provider, MD  benzonatate (TESSALON) 200 MG capsule Take 1 capsule (200 mg total) by mouth 3 (three) times daily as needed for cough. 03/29/15  Yes Everlene Balls, MD  lisinopril-hydrochlorothiazide (PRINZIDE,ZESTORETIC) 10-12.5 MG per tablet Take 1 tablet by mouth daily.   Yes Historical Provider, MD  omeprazole (PRILOSEC) 20 MG capsule Take 20 mg by mouth daily.   Yes Historical Provider, MD  amoxicillin (AMOXIL) 500 MG capsule Take 2 capsules (1,000 mg total) by mouth 2 (two) times daily. Patient not taking: Reported on 04/02/2015 03/05/15   Charlann Lange, PA-C  azithromycin (ZITHROMAX Z-PAK) 250 MG tablet 2 po day one, then 1 daily x 4 days Patient not taking: Reported on 04/02/2015 03/20/15   Daleen Bo, MD  chlorpheniramine-HYDROcodone [facility] PENNKINETIC ER) 10-8 MG/5ML SUER Take 5 mLs by mouth at bedtime as needed for cough. Patient not taking: Reported on 04/02/2015 03/05/15   Charlann Lange, PA-C  predniSONE (DELTASONE) 10 MG tablet Take q day 6,5,4,3,2,1 Patient not taking: Reported on 04/02/2015 03/20/15   Daleen Bo, MD   BP 157/79 mmHg  Pulse 86  Temp(Src) 99.1 F (37.3 C) (Oral)  Resp 24  Ht 5\' 6"  (1.676 m)  Wt 130 lb (58.968 kg)  BMI 20.99 kg/m2  SpO2 97% Physical Exam  Constitutional: He is oriented to person, place, and time. He appears well-developed and well-nourished. He appears distressed.  HENT:  Head: Normocephalic and atraumatic.  Eyes: Conjunctivae are normal.  Neck: Neck supple.  Cardiovascular: Regular rhythm and normal heart sounds.  Tachycardia present.   Pulmonary/Chest: Accessory muscle usage present. Tachypnea noted. He is in respiratory distress. He has wheezes (inspiratory and expiratory, prolonged expiratory phase). He exhibits no tenderness.  Abdominal: Soft. There is no tenderness.  Musculoskeletal: He exhibits no edema.  Neurological: He is alert and oriented to person, place, and time.  Skin: Skin is warm and dry.  Nursing note and vitals reviewed.   ED Course  Procedures (including critical care time) Medications  albuterol (PROVENTIL,VENTOLIN) solution continuous neb (10 mg/hr Nebulization New Bag/Given 04/02/15 0405)  methylPREDNISolone sodium succinate (SOLU-MEDROL) 125 mg/2 mL injection 125 mg (125 mg Intravenous Given 04/02/15 0359)  ipratropium (ATROVENT) nebulizer solution 0.5 mg (0.5 mg Nebulization Given 04/02/15 0405)    Labs Review Labs Reviewed  CBC WITH DIFFERENTIAL/PLATELET - Abnormal; Notable for the following:    RBC 4.08 (*)    HCT 38.4 (*)    Eosinophils Relative  29 (*)    Eosinophils Absolute 2.7 (*)    All other components within normal limits  BASIC METABOLIC PANEL - Abnormal; Notable for the following:    Sodium 134 (*)    Glucose, Bld 118 (*)    All other components within normal limits  BLOOD GAS, ARTERIAL - Abnormal; Notable for the following:    Bicarbonate 24.7 (*)    All other components within normal limits  BRAIN NATRIURETIC PEPTIDE    Imaging Review Dg Chest Port 1 View  04/02/2015   CLINICAL DATA:  Acute onset of shortness of breath. Initial encounter.  EXAM: PORTABLE CHEST - 1 VIEW  COMPARISON:  Chest radiograph performed 03/29/2015, and CTA of the chest performed 03/18/2015  FINDINGS: The lungs are well-aerated. Mild peribronchial thickening is noted. A 1.2 cm nodular density at the right midlung zone reflects the patient's nipple, on correlation with prior CTA. There is no evidence of focal opacification, pleural effusion or pneumothorax.  The cardiomediastinal silhouette is within normal limits. No acute osseous abnormalities are seen.  IMPRESSION: Mild peribronchial thickening noted; lungs otherwise clear.   Electronically Signed   By: Garald Balding M.D.   On: 04/02/2015 04:33     EKG Interpretation  Date/Time:  Tuesday April 02 2015 03:37:46 EDT Ventricular Rate:  110 PR Interval:  185 QRS Duration: 83 QT Interval:  324 QTC Calculation: 438 R Axis:   83 Text Interpretation:  Sinus tachycardia Biatrial enlargement Borderline  right axis deviation Baseline wander in lead(s) II III aVL aVF V4 V5 V6  Confirmed by OTTER  MD, OLGA ([id number]) on 04/02/2015 4:13:54 AM      4:33 AM accessory muscle use has improved, not entirely resolved. No longer tachycardic. Inspiratory and expiratory wheezes still appreciated but improving. Patient is endorsing improvement of symptoms while on continuous hour-long nebulizer.  CRITICAL CARE Performed by: Baron Sane L   Total critical care time: 35 minutes  Critical care time was  exclusive of separately billable procedures and treating other patients.  Critical care was necessary to treat or prevent imminent or life-threatening deterioration.  Critical care was time spent personally by me on the following activities: development of treatment plan with patient and/or surrogate as well as nursing, discussions with consultants, evaluation of patient's response to treatment, examination of patient, obtaining history from patient or surrogate, ordering and performing treatments and interventions, ordering and review of laboratory studies, ordering and review of radiographic studies, pulse oximetry and re-evaluation of patient's condition.   MDM   Final diagnoses:  COPD exacerbation    Filed Vitals:   04/02/15 0518  BP: 157/79  Pulse: 86  Temp:   Resp: 24     I have reviewed nursing notes, vital signs, and all lab and all imaging results as noted above. Patient improved after CAT, IV Solumedrol. Still with mild dyspnea, accessory muscle use, prolonged expiratory phase, and expiratory wheeze, will admit to hospitalist for further management and evaluation of symptoms.  Patient d/w with Dr. Sharol Given, agrees with plan.    Baron Sane, PA-C 04/02/15 [place], MD 04/02/15 [phone]

## 2015-04-02 NOTE — ED Notes (Signed)
Pt has hx of emphysema and became short of breath all today

## 2015-04-02 NOTE — H&P (Signed)
PCP:   PROVIDER NOT IN SYSTEM   Chief Complaint:  wheeze  HPI: This is a 64 year old gentleman who was never smoke but uses marijuana. He states approximately 2 months ago he developed shortness of breath and wheezing. This is been persistent. He has been to ER 6 - 7 times within that time. Last night his wheezing returned and became pretty significant. He was short of breath. He has been coughing but nonproductive. He denies a fever, chills, nausea, vomiting or diarrhea. He denies any abdominal pain. He states he is not able to eat much or drink much because of his wheezing. He states he's lost at least 10 pounds in the last month. He's used his daughter's nebulizer machine without improvement. He finally came to ER. Here he received several rounds of nebulizers. He continues to wheeze. The PA requested admission.  Review of Systems:  The patient denies anorexia, fever, weight loss,, vision loss, decreased hearing, hoarseness, chest pain, syncope, dyspnea on exertion, peripheral edema, balance deficits, hemoptysis, abdominal pain, melena, hematochezia, severe indigestion/heartburn, hematuria, incontinence, genital sores, muscle weakness, suspicious skin lesions, transient blindness, difficulty walking, depression, unusual weight change, abnormal bleeding, enlarged lymph nodes, angioedema, and breast masses.  Past Medical History: Past Medical History  Diagnosis Date  . HTN (hypertension)   . Bradycardia, sinus 12/24/11  . Syncope and collapse 12/24/11    2/2 hypotension in the setting of dehydration  . Hypotension 12/24/11    in the setting of dehydration   . Abnormal EKG 12/24/11    anteroseptal and lateral ST elevation, felt r/t early repolarization;  Cardiac cath 12/24/11 - normal coronary anatomy, EF 55-65%  . Marijuana use   . History of alcohol abuse     hospitalized for detox 2002   Past Surgical History  Procedure Laterality Date  . Circumcision  1972  . Laceration repair  02/2004    arthroscopic debridement of triagular fibrocartilage tear/E-chart; right wrist  . Cardiac catheterization  12/24/11    normal coronary anatomy, EF 55-65%  . Left heart catheterization with coronary angiogram N/A 12/24/2011    Procedure: LEFT HEART CATHETERIZATION WITH CORONARY ANGIOGRAM;  Surgeon: Peter M Martinique, MD;  Location: [facility] CATH LAB;  Service: Cardiovascular;  Laterality: N/A;    Medications: Prior to Admission medications   Medication Sig Start Date End Date Taking? Authorizing Provider  albuterol (PROVENTIL HFA;VENTOLIN HFA) 108 (90 BASE) MCG/ACT inhaler Inhale 1-2 puffs into the lungs every 6 (six) hours as needed for wheezing or shortness of breath.   Yes Historical Provider, MD  albuterol (PROVENTIL) (2.5 MG/3ML) 0.083% nebulizer solution Take 3-6 mLs (2.5-5 mg total) by nebulization every 4 (four) hours as needed for wheezing or shortness of breath. 03/12/15  Yes John Molpus, MD  Aspirin-Acetaminophen-Caffeine (GOODY HEADACHE PO) Take 1 packet by mouth every 6 (six) hours as needed (for pain.).   Yes Historical Provider, MD  benzonatate (TESSALON) 200 MG capsule Take 1 capsule (200 mg total) by mouth 3 (three) times daily as needed for cough. 03/29/15  Yes Everlene Balls, MD  lisinopril-hydrochlorothiazide (PRINZIDE,ZESTORETIC) 10-12.5 MG per tablet Take 1 tablet by mouth daily.   Yes Historical Provider, MD  omeprazole (PRILOSEC) 20 MG capsule Take 20 mg by mouth daily.   Yes Historical Provider, MD  amoxicillin (AMOXIL) 500 MG capsule Take 2 capsules (1,000 mg total) by mouth 2 (two) times daily. Patient not taking: Reported on 04/02/2015 03/05/15   Charlann Lange, PA-C  azithromycin (ZITHROMAX Z-PAK) 250 MG tablet 2 po day one,  then 1 daily x 4 days Patient not taking: Reported on 04/02/2015 03/20/15   Daleen Bo, MD  chlorpheniramine-HYDROcodone [facility] ER) 10-8 MG/5ML SUER Take 5 mLs by mouth at bedtime as needed for cough. Patient not taking: Reported on 04/02/2015  03/05/15   Charlann Lange, PA-C  predniSONE (DELTASONE) 10 MG tablet Take q day 6,5,4,3,2,1 Patient not taking: Reported on 04/02/2015 03/20/15   Daleen Bo, MD    Allergies:  No Known Allergies  Social History:  reports that he has never smoked. He has never used smokeless tobacco. He reports that he drinks alcohol. He reports that he uses illicit drugs (Marijuana).  Family History: History reviewedBronchitis  Physical Exam: Filed Vitals:   04/02/15 0339 04/02/15 0518 04/02/15 0615  BP: 155/90 157/79 142/93  Pulse: 110 86 108  Temp: 99.1 F (37.3 C)    TempSrc: Oral    Resp: 31 24 17   Height: 5\' 6"  (1.676 m)    Weight: 58.968 kg (130 lb)    SpO2: 96% 97% 97%    General:  Alert and oriented times three, well developed and nourished, no acute distress Eyes: PERRLA, pink conjunctiva, no scleral icterus ENT: Moist oral mucosa, neck supple, no thyromegaly Lungs:psotive wheezing Cardiovascular: regular rate and rhythm, no regurgitation, no gallops, no murmurs. No carotid bruits, no JVD Abdomen: soft, positive BS, non-tender, non-distended, no organomegaly, not an acute abdomen GU: not examined Neuro: CN II - XII grossly intact, sensation intact Musculoskeletal: strength 5/5 all extremities, no clubbing, cyanosis or edema Skin: no rash, no subcutaneous crepitation, no decubitus Psych: appropriate patient   Labs on Admission:   Recent Labs  04/02/15 0352  NA 134*  K 4.4  CL 105  CO2 23  GLUCOSE 118*  BUN 15  CREATININE 0.73  CALCIUM 9.0   No results for input(s): AST, ALT, ALKPHOS, BILITOT, PROT, ALBUMIN in the last 72 hours. No results for input(s): LIPASE, AMYLASE in the last 72 hours.  Recent Labs  04/02/15 0352  WBC 9.2  NEUTROABS 4.0  HGB 13.3  HCT 38.4*  MCV 94.1  PLT 338    Invalid input(s): FREET3 No results for input(s): VITAMINB12, FOLATE, FERRITIN, TIBC, IRON, RETICCTPCT in the last 72 hours.  Micro Results: No results found for this or any  previous visit (from the past 240 hour(s)).   Radiological Exams on Admission: Dg Chest Port 1 View  04/02/2015   CLINICAL DATA:  Acute onset of shortness of breath. Initial encounter.  EXAM: PORTABLE CHEST - 1 VIEW  COMPARISON:  Chest radiograph performed 03/29/2015, and CTA of the chest performed 03/18/2015  FINDINGS: The lungs are well-aerated. Mild peribronchial thickening is noted. A 1.2 cm nodular density at the right midlung zone reflects the patient's nipple, on correlation with prior CTA. There is no evidence of focal opacification, pleural effusion or pneumothorax.  The cardiomediastinal silhouette is within normal limits. No acute osseous abnormalities are seen.  IMPRESSION: Mild peribronchial thickening noted; lungs otherwise clear.   Electronically Signed   By: Garald Balding M.D.   On: 04/02/2015 04:33    Assessment/Plan Present on Admission:  . COPD with acute exacerbation -bring in for 23 hour observation -Respiratory to evaluate and treat -Solu-Medrol, oxygen, nebulizers, antibiotics ordered HTN -Stable  Kourtnee Lahey 04/02/2015, 6:29 AM

## 2015-04-03 DIAGNOSIS — J441 Chronic obstructive pulmonary disease with (acute) exacerbation: Principal | ICD-10-CM

## 2015-04-03 DIAGNOSIS — I10 Essential (primary) hypertension: Secondary | ICD-10-CM

## 2015-04-03 DIAGNOSIS — F1210 Cannabis abuse, uncomplicated: Secondary | ICD-10-CM

## 2015-04-03 LAB — CBC
HCT: 40.5 % (ref 39.0–52.0)
Hemoglobin: 13.7 g/dL (ref 13.0–17.0)
MCH: 31.8 pg (ref 26.0–34.0)
MCHC: 33.8 g/dL (ref 30.0–36.0)
MCV: 94 fL (ref 78.0–100.0)
Platelets: 356 10*3/uL (ref 150–400)
RBC: 4.31 MIL/uL (ref 4.22–5.81)
RDW: 13.8 % (ref 11.5–15.5)
WBC: 8.7 10*3/uL (ref 4.0–10.5)

## 2015-04-03 LAB — BASIC METABOLIC PANEL
Anion gap: 6 (ref 5–15)
BUN: 14 mg/dL (ref 6–20)
CO2: 28 mmol/L (ref 22–32)
Calcium: 9.6 mg/dL (ref 8.9–10.3)
Chloride: 100 mmol/L — ABNORMAL LOW (ref 101–111)
Creatinine, Ser: 0.78 mg/dL (ref 0.61–1.24)
GFR calc Af Amer: >60 mL/min (ref >60)
GFR calc non Af Amer: >60 mL/min (ref >60)
Glucose, Bld: 154 mg/dL — ABNORMAL HIGH (ref 65–99)
Potassium: 4.9 mmol/L (ref 3.5–5.1)
Sodium: 134 mmol/L — ABNORMAL LOW (ref 135–145)

## 2015-04-03 MED ORDER — PREDNISONE 5 MG PO TABS: 5.0000 mg | ORAL_TABLET | Freq: Every day | ORAL | Status: DC

## 2015-04-03 MED ORDER — ALBUTEROL SULFATE HFA 108 (90 BASE) MCG/ACT IN AERS: 1.0000 | INHALATION_SPRAY | Freq: Four times a day (QID) | RESPIRATORY_TRACT | Status: DC | PRN

## 2015-04-03 MED ORDER — NEBULIZER DEVI: 1.0000 | Status: DC | PRN

## 2015-04-03 MED ORDER — ZOLPIDEM TARTRATE 5 MG PO TABS
5.0000 mg | ORAL_TABLET | Freq: Once | ORAL | Status: AC
  Administered 2015-04-03: 5 mg via ORAL
  Filled 2015-04-03: qty 1

## 2015-04-03 MED ORDER — BUDESONIDE-FORMOTEROL FUMARATE 80-4.5 MCG/ACT IN AERO: 2.0000 | INHALATION_SPRAY | Freq: Two times a day (BID) | RESPIRATORY_TRACT | Status: DC

## 2015-04-03 NOTE — Progress Notes (Signed)
Initial Nutrition Assessment  DOCUMENTATION CODES:   Not applicable  INTERVENTION:  - Continue Heart Healthy diet  NUTRITION DIAGNOSIS:   Unintentional weight loss related to acute illness as evidenced by per patient/family report.  GOAL:   Patient will meet greater than or equal to 90% of their needs  MONITOR:   PO intake, Weight trends, Labs  REASON FOR ASSESSMENT:   Consult Assessment of nutrition requirement/status  ASSESSMENT:  64 year old gentleman who was never smoke but uses marijuana. He states approximately 2 months ago he developed shortness of breath and wheezing. This is been persistent. He has been to ER 6 - 7 times within that time. Last night his wheezing returned and became pretty significant. He was short of breath. He has been coughing but nonproductive. He denies a fever, chills, nausea, vomiting or diarrhea. He denies any abdominal pain. He states he is not able to eat much or drink much because of his wheezing. He states he's lost at least 10 pounds in the last month. He's used his daughter's nebulizer machine without improvement. He finally came to ER. Here he received several rounds of nebulizers. He continues to wheeze.  Pt seen for consult. BMI indicates normal weight status. PTA, pt had a difficult time with PO intakes due to SOB/wheezing. Since admission, this has improved. He consumed 95% lunch and 100% dinner yesterday and 100% of breakfast this AM.  He indicates ~10 lb weight loss in the past 1 month due to decreased intakes. Per weight hx review, pt has lost 12 lbs (8% body weight) in the past one month which is significant for time frame.   Meeting needs currently. D/c order has been placed this AM. No muscle or fat wasting at this time. Medications reviewed. Labs reviewed.   Diet Order:  Diet Heart Room service appropriate?: Yes; Fluid consistency:: Thin  Skin:  Reviewed, no issues  Last BM:  PTA  Height:   Ht Readings from Last 1  Encounters:  04/02/15 5\' 6"  (1.676 m)    Weight:   Wt Readings from Last 1 Encounters:  04/02/15 136 lb 7.4 oz (61.9 kg)    Ideal Body Weight:  64.54 kg (kg)  Wt Readings from Last 10 Encounters:  04/02/15 136 lb 7.4 oz (61.9 kg)  03/11/15 149 lb (67.586 kg)  03/05/15 148 lb (67.132 kg)  09/29/13 174 lb (78.926 kg)  12/25/11 143 lb 4.8 oz (65 kg)    BMI:  Body mass index is 22.04 kg/(m^2).  Estimated Nutritional Needs:   Kcal:  1550-1750  Protein:  65-75 grams  Fluid:  1.7 L/day  EDUCATION NEEDS:   No education needs identified at this time    Jarome Matin, RD, LDN Inpatient Clinical Dietitian Pager # [phone] After hours/weekend pager # [phone]

## 2015-04-03 NOTE — Discharge Instructions (Signed)
°  Congratulations on quitting smoking!

## 2015-04-03 NOTE — Discharge Summary (Signed)
Physician Discharge Summary  [name] [name] ZOX:[id number] DOB: July 16, 1951 DOA: 04/02/2015  PCP: PROVIDER NOT IN SYSTEM  Admit date: 04/02/2015 Discharge date: 04/03/2015  Time spent: 20 minutes  Recommendations for Outpatient Follow-up:  1. Follow up with PCP in 1-2 weeks  Discharge Diagnoses:  Principal Problem:   COPD with acute exacerbation Active Problems:   HTN (hypertension)   Discharge Condition: Improved  Diet recommendation: Heart healthy  Filed Weights   04/02/15 0339 04/02/15 0859  Weight: 58.968 kg (130 lb) 61.9 kg (136 lb 7.4 oz)    History of present illness:  Please see admit h and p from 7/19 for details. Briefly, pt presented with acute COPD exacerbation in the setting of prior marijuana abuse (pt reports quitting two months prior to admission). The patient was admitted for further work up.  Hospital Course:  Acute COPD exacerbation Patient was admitted to medical floor under observation. - Patient was continued on scheduled IV Solu-Medrol 80 mg every 8 hours with Bonamine every 4 hours when necessary. Patient was started on empiric Levaquin. Chest x-ray negative for infection. Recent CT angiogram of the chest done on 7/4 showing emphysematous changes only. Patient was continued on supportive care with Robitussin and Tessalon. Continue albuterol inhaler. Will be discharged with symbicort upon discharge. -Counseled strongly on marijuana cessation. Patient earlier reported smoking for past 4 weeks due to worsening symptoms but later claimed to have quit two months prior. Also reports almost 10 pound weight loss in past 1-2 months.  Uncontrolled hypertension On HCTZ and lisinopril with PRN hydralazine.  GERD Continued PPI  Protein calorie malnutrition Nutrition consulted  DVT prophylaxis: Subcutaneous heparin while admitted   Discharge Exam: Filed Vitals:   04/02/15 2107 04/02/15 2147 04/03/15 0613 04/03/15 0820  BP: 113/72  111/74   Pulse: 87  104    Temp: 98.4 F (36.9 C)  98.2 F (36.8 C)   TempSrc: Oral  Oral   Resp: 16  18   Height:      Weight:      SpO2: 99% 98% 94% 98%    General: Awake, in nad Cardiovascular: regular, s1, s2 Respiratory: normal resp effort, no wheezing  Discharge Instructions     Medication List    STOP taking these medications        amoxicillin 500 MG capsule  Commonly known as:  AMOXIL     azithromycin 250 MG tablet  Commonly known as:  ZITHROMAX Z-PAK     chlorpheniramine-HYDROcodone 10-8 MG/5ML Suer  Commonly known as:  TUSSIONEX PENNKINETIC ER     GOODY HEADACHE PO      TAKE these medications        albuterol (2.5 MG/3ML) 0.083% nebulizer solution  Commonly known as:  PROVENTIL  Take 3-6 mLs (2.5-5 mg total) by nebulization every 4 (four) hours as needed for wheezing or shortness of breath.     albuterol 108 (90 BASE) MCG/ACT inhaler  Commonly known as:  PROVENTIL HFA;VENTOLIN HFA  Inhale 1-2 puffs into the lungs every 6 (six) hours as needed for wheezing or shortness of breath.     benzonatate 200 MG capsule  Commonly known as:  TESSALON  Take 1 capsule (200 mg total) by mouth 3 (three) times daily as needed for cough.     budesonide-formoterol 80-4.5 MCG/ACT inhaler  Commonly known as:  SYMBICORT  Inhale 2 puffs into the lungs 2 (two) times daily.     lisinopril-hydrochlorothiazide 10-12.5 MG per tablet  Commonly known as:  PRINZIDE,ZESTORETIC  Take  1 tablet by mouth daily.     omeprazole 20 MG capsule  Commonly known as:  PRILOSEC  Take 20 mg by mouth daily.     predniSONE 5 MG tablet  Commonly known as:  DELTASONE  Take 1 tablet (5 mg total) by mouth daily with breakfast.       No Known Allergies Follow-up Information    Schedule an appointment as soon as possible for a visit with Follow up with your PCP in 1-2 weeks.   Why:  Hospital follow up       The results of significant diagnostics from this hospitalization (including imaging, microbiology,  ancillary and laboratory) are listed below for reference.    Significant Diagnostic Studies: Dg Chest 2 View  03/29/2015   CLINICAL DATA:  Shortness of breath with productive cough, wheezing, and chest congestion over 2 weeks.  EXAM: CHEST  2 VIEW  COMPARISON:  03/20/2015  FINDINGS: Mild hyperinflation. The heart size and mediastinal contours are within normal limits. Both lungs are clear. Nodular opacity projected over the mid lungs probably due to prominent nipple shadows and unchanged since prior study. The visualized skeletal structures are unremarkable.  IMPRESSION: No active cardiopulmonary disease.   Electronically Signed   By: Lucienne Capers M.D.   On: 03/29/2015 01:46   Dg Chest 2 View (if Patient Has Fever And/or Copd)  03/20/2015   CLINICAL DATA:  64 year old male with shortness of breath and productive cough  EXAM: CHEST  2 VIEW  COMPARISON:  CT dated 03/18/2015  FINDINGS: Two views of the chest demonstrate emphysematous changes of the lungs. No focal consolidation, pleural effusion, or pneumothorax. Subcentimeter nodular appearing densities over the lower lung fields bilaterally correspond to the nipples. The cardiomediastinal silhouette is within normal limits. The visualized osseous structures are grossly unremarkable.  IMPRESSION: Emphysema.  No focal consolidation.   Electronically Signed   By: Anner Crete M.D.   On: 03/20/2015 16:16   Dg Chest 2 View (if Patient Has Fever And/or Copd)  03/18/2015   CLINICAL DATA:  Acute onset of cough and shortness of breath. Current history of hypertension.  EXAM: CHEST  2 VIEW  COMPARISON:  03/11/2015 and earlier.  FINDINGS: AP semi-erect and lateral images were obtained. Cardiomediastinal silhouette unremarkable, unchanged. Nodular opacity projected over the lateral right lung base was shown on prior examinations to represent the nipple shadow. Mild central peribronchial thickening, more so than on the prior examinations. Lungs otherwise clear. No  localized airspace consolidation. No pleural effusions. No pneumothorax. Normal pulmonary vascularity. Mild degenerative changes involving the thoracic spine and slight thoracic scoliosis convex right, unchanged.  IMPRESSION: Mild changes of acute bronchitis and/or asthma without focal airspace pneumonia.   Electronically Signed   By: Evangeline Dakin M.D.   On: 03/18/2015 12:27   Dg Chest 2 View  03/11/2015   CLINICAL DATA:  Shortness of breath and productive cough for 3 weeks.  EXAM: CHEST  2 VIEW  COMPARISON:  PA and lateral chest 03/05/2015 and 12/24/2011.  FINDINGS: The lungs are clear. Heart size is normal. No pneumothorax or pleural effusion. No focal bony abnormality. Nodular opacity projecting over the left lower chest is consistent with the patient's nipple and unchanged since 2013.  IMPRESSION: No acute disease.   Electronically Signed   By: Inge Rise M.D.   On: 03/11/2015 21:47   Dg Chest 2 View (if Patient Has Fever And/or Copd)  03/05/2015   CLINICAL DATA:  Patient with shortness of breath and nonproductive  cough for 2 days.  EXAM: CHEST  2 VIEW  COMPARISON:  Chest radiograph 12/24/2011  FINDINGS: Monitoring leads overlie the patient. Stable cardiac and mediastinal contours. No consolidative pulmonary opacities. No pleural effusion or pneumothorax. Mid thoracic spine degenerative changes. Probable nipple shadow over the left lower lung versus stable nodule dating back 2009.  IMPRESSION: No acute cardiopulmonary process.   Electronically Signed   By: Lovey Newcomer M.D.   On: 03/05/2015 02:24   Ct Angio Chest Pe W/cm &/or Wo Cm  03/18/2015   CLINICAL DATA:  Shortness of breath and excessive cough. History of asthma/ COPD.  EXAM: CT ANGIOGRAPHY CHEST WITH CONTRAST  TECHNIQUE: Multidetector CT imaging of the chest was performed using the standard protocol during bolus administration of intravenous contrast. Multiplanar CT image reconstructions and MIPs were obtained to evaluate the vascular  anatomy.  CONTRAST:  19mL OMNIPAQUE IOHEXOL 350 MG/ML SOLN  COMPARISON:  Plain film of earlier today.  No prior CT.  FINDINGS: Mediastinum/Nodes: The quality of this exam for evaluation of pulmonary embolism is poor. Despite 2 attempts, the bolus is poorly timed, with contrast both in the aorta and SVC. No central or large lobar pulmonary embolism. Smaller emboli cannot be excluded.  Bovine arch. Aortic and branch vessel atherosclerosis. Normal heart size, without pericardial effusion. No mediastinal or hilar adenopathy.  Lungs/Pleura: No pleural fluid. Lower lobe predominant bronchial wall thickening. Mild to moderate centrilobular emphysema. Biapical pleural thickening.  Minimal dependent posterior right upper lobe opacity is favored to represent atelectasis.  Upper abdomen: Normal imaged portions of the liver, spleen, stomach, pancreas, adrenal glands, kidneys.  Musculoskeletal: No acute osseous abnormality. Convex right thoracic spine curvature.  Review of the MIP images confirms the above findings.  IMPRESSION: 1. Suboptimal exam, secondary to the poor bolus timing, despite 2 attempts. No central or large lobar embolism. 2. Centrilobular emphysema, without other explanation for patient's symptoms.   Electronically Signed   By: Abigail Miyamoto M.D.   On: 03/18/2015 14:43   Dg Chest Port 1 View  04/02/2015   CLINICAL DATA:  Acute onset of shortness of breath. Initial encounter.  EXAM: PORTABLE CHEST - 1 VIEW  COMPARISON:  Chest radiograph performed 03/29/2015, and CTA of the chest performed 03/18/2015  FINDINGS: The lungs are well-aerated. Mild peribronchial thickening is noted. A 1.2 cm nodular density at the right midlung zone reflects the patient's nipple, on correlation with prior CTA. There is no evidence of focal opacification, pleural effusion or pneumothorax.  The cardiomediastinal silhouette is within normal limits. No acute osseous abnormalities are seen.  IMPRESSION: Mild peribronchial thickening  noted; lungs otherwise clear.   Electronically Signed   By: Garald Balding M.D.   On: 04/02/2015 04:33    Microbiology: No results found for this or any previous visit (from the past 240 hour(s)).   Labs: Basic Metabolic Panel:  Recent Labs Lab 03/29/15 0031 04/02/15 0352 04/03/15 0530  NA 136 134* 134*  K 3.7 4.4 4.9  CL 105 105 100*  CO2 22 23 28   GLUCOSE 126* 118* 154*  BUN 9 15 14   CREATININE 0.80 0.73 0.78  CALCIUM 9.0 9.0 9.6   Liver Function Tests:  Recent Labs Lab 03/29/15 0031  AST 32  ALT 39  ALKPHOS 52  BILITOT 0.1*  PROT 6.8  ALBUMIN 3.2*   No results for input(s): LIPASE, AMYLASE in the last 168 hours. No results for input(s): AMMONIA in the last 168 hours. CBC:  Recent Labs Lab 03/29/15 0031 04/02/15  0352 04/03/15 0530  WBC 9.0 9.2 8.7  NEUTROABS  --  4.0  --   HGB 12.9* 13.3 13.7  HCT 37.0* 38.4* 40.5  MCV 93.0 94.1 94.0  PLT 363 338 356   Cardiac Enzymes: No results for input(s): CKTOTAL, CKMB, CKMBINDEX, TROPONINI in the last 168 hours. BNP: BNP (last 3 results)  Recent Labs  04/02/15 0352  BNP 9.7    ProBNP (last 3 results) No results for input(s): PROBNP in the last 8760 hours.  CBG: No results for input(s): GLUCAP in the last 168 hours.     Signed:  CHIU, Orpah Melter  [facility] 04/03/2015, 9:31 AM

## 2015-04-05 ENCOUNTER — Emergency Department (HOSPITAL_COMMUNITY)
Admission: EM | Admit: 2015-04-05 | Discharge: 2015-04-05 | Disposition: A | Discharge status: Home / Self Care | Payer: Non-veteran care | Attending: Emergency Medicine | Admitting: Emergency Medicine

## 2015-04-05 ENCOUNTER — Encounter (HOSPITAL_COMMUNITY): Payer: Self-pay | Admitting: Emergency Medicine

## 2015-04-05 ENCOUNTER — Emergency Department (HOSPITAL_COMMUNITY): Payer: Non-veteran care

## 2015-04-05 DIAGNOSIS — Z79899 Other long term (current) drug therapy: Secondary | ICD-10-CM | POA: Insufficient documentation

## 2015-04-05 DIAGNOSIS — J449 Chronic obstructive pulmonary disease, unspecified: Secondary | ICD-10-CM | POA: Diagnosis not present

## 2015-04-05 DIAGNOSIS — R0602 Shortness of breath: Secondary | ICD-10-CM | POA: Diagnosis present

## 2015-04-05 DIAGNOSIS — I10 Essential (primary) hypertension: Secondary | ICD-10-CM | POA: Diagnosis not present

## 2015-04-05 DIAGNOSIS — J385 Laryngeal spasm: Secondary | ICD-10-CM | POA: Diagnosis not present

## 2015-04-05 HISTORY — DX: Chronic obstructive pulmonary disease, unspecified: J44.9

## 2015-04-05 LAB — I-STAT ARTERIAL BLOOD GAS, ED
ACID-BASE DEFICIT: 2 mmol/L (ref 0.0–2.0)
Bicarbonate: 22.3 mEq/L (ref 20.0–24.0)
O2 SAT: 94 %
TCO2: 23 mmol/L (ref 0–100)
pCO2 arterial: 35.3 mmHg (ref 35.0–45.0)
pH, Arterial: 7.409 (ref 7.350–7.450)
pO2, Arterial: 68 mmHg — ABNORMAL LOW (ref 80.0–100.0)

## 2015-04-05 LAB — BASIC METABOLIC PANEL
Anion gap: 9 (ref 5–15)
BUN: 18 mg/dL (ref 6–20)
CO2: 24 mmol/L (ref 22–32)
Calcium: 8.7 mg/dL — ABNORMAL LOW (ref 8.9–10.3)
Chloride: 99 mmol/L — ABNORMAL LOW (ref 101–111)
Creatinine, Ser: 0.9 mg/dL (ref 0.61–1.24)
GFR calc non Af Amer: >60 mL/min (ref >60)
Glucose, Bld: 111 mg/dL — ABNORMAL HIGH (ref 65–99)
POTASSIUM: 3.8 mmol/L (ref 3.5–5.1)
Sodium: 132 mmol/L — ABNORMAL LOW (ref 135–145)

## 2015-04-05 LAB — CBC
HCT: 38.7 % — ABNORMAL LOW (ref 39.0–52.0)
HEMOGLOBIN: 13.7 g/dL (ref 13.0–17.0)
MCH: 32.7 pg (ref 26.0–34.0)
MCHC: 35.4 g/dL (ref 30.0–36.0)
MCV: 92.4 fL (ref 78.0–100.0)
Platelets: 327 10*3/uL (ref 150–400)
RBC: 4.19 MIL/uL — AB (ref 4.22–5.81)
RDW: 13.4 % (ref 11.5–15.5)
WBC: 5.5 10*3/uL (ref 4.0–10.5)

## 2015-04-05 LAB — BRAIN NATRIURETIC PEPTIDE: B Natriuretic Peptide: 5.2 pg/mL (ref 0.0–100.0)

## 2015-04-05 LAB — I-STAT TROPONIN, ED: TROPONIN I, POC: 0 ng/mL (ref 0.00–0.08)

## 2015-04-05 MED ORDER — PREDNISONE 20 MG PO TABS
60.0000 mg | ORAL_TABLET | Freq: Once | ORAL | Status: AC
  Administered 2015-04-05: 60 mg via ORAL
  Filled 2015-04-05: qty 3

## 2015-04-05 MED ORDER — ALBUTEROL SULFATE HFA 108 (90 BASE) MCG/ACT IN AERS
1.0000 | INHALATION_SPRAY | Freq: Once | RESPIRATORY_TRACT | Status: AC
  Administered 2015-04-05: 2 via RESPIRATORY_TRACT
  Filled 2015-04-05: qty 6.7

## 2015-04-05 NOTE — Progress Notes (Signed)
[facility] ED Cm spoke with daughter, Antony Madura [phone] when she called the [facility] ED and spoke with ED unit secretary about needing assist to get his medication This pt is seen at [facility] not in [place]. Cm discussed [place] or outpatient pharmacies that may be able to assist Recommended going to [facility] for assist.  CM reviewed goodrx.com to assist with finding discount cost for each medication Antony Madura states pt d/c on 04/03/15 from the unit with rx for albuterol neb, albuterol inhaler and symbicort inhaler Cm suggested use of nebulizer with machine until albuterol inhaler obtained  1627 Nicky called Cm back to updated her taht she took pt to [facility] and he obtained all his medicines

## 2015-04-05 NOTE — Discharge Instructions (Signed)

## 2015-04-05 NOTE — ED Provider Notes (Signed)
CSN: [id number]     Arrival date & time 04/05/15  4332 History   First MD Initiated Contact with Patient 04/05/15 [phone]     Chief Complaint  Patient presents with  . Shortness of Breath   HPI The patient presents to the ED with shortness of breath.  He was recently admitted to the hospital for similar symptoms and released just the other day.  He had improved when he left the hospital.  He suddenly woke up early this am feeling short of breath when he woke up.  His voice became a harsh whisper and he couldn't breathe.  No fever.  No sore throat.  No chest pain.  No leg swelling.  He called 911 and feels slightly better after a treatment. Past Medical History  Diagnosis Date  . HTN (hypertension)   . Bradycardia, sinus 12/24/11  . Syncope and collapse 12/24/11    2/2 hypotension in the setting of dehydration  . Hypotension 12/24/11    in the setting of dehydration   . Abnormal EKG 12/24/11    anteroseptal and lateral ST elevation, felt r/t early repolarization;  Cardiac cath 12/24/11 - normal coronary anatomy, EF 55-65%  . Marijuana use   . History of alcohol abuse     hospitalized for detox 2002  . COPD (chronic obstructive pulmonary disease)    Past Surgical History  Procedure Laterality Date  . Circumcision  1972  . Laceration repair  02/2004    arthroscopic debridement of triagular fibrocartilage tear/E-chart; right wrist  . Cardiac catheterization  12/24/11    normal coronary anatomy, EF 55-65%  . Left heart catheterization with coronary angiogram N/A 12/24/2011    Procedure: LEFT HEART CATHETERIZATION WITH CORONARY ANGIOGRAM;  Surgeon: Peter M Martinique, MD;  Location: [facility] CATH LAB;  Service: Cardiovascular;  Laterality: N/A;   No family history on file. History  Substance Use Topics  . Smoking status: Never Smoker   . Smokeless tobacco: Never Used  . Alcohol Use: Yes     Comment: "    Review of Systems  All other systems reviewed and are negative.     Allergies  Review of  patient's allergies indicates no known allergies.  Home Medications   Prior to Admission medications   Medication Sig Start Date End Date Taking? Authorizing Provider  albuterol (PROVENTIL) (2.5 MG/3ML) 0.083% nebulizer solution Take 3-6 mLs (2.5-5 mg total) by nebulization every 4 (four) hours as needed for wheezing or shortness of breath. 03/12/15  Yes John Molpus, MD  benzonatate (TESSALON) 200 MG capsule Take 1 capsule (200 mg total) by mouth 3 (three) times daily as needed for cough. 03/29/15  Yes Everlene Balls, MD  lisinopril-hydrochlorothiazide (PRINZIDE,ZESTORETIC) 10-12.5 MG per tablet Take 1 tablet by mouth daily.   Yes Historical Provider, MD  omeprazole (PRILOSEC) 20 MG capsule Take 20 mg by mouth daily.   Yes Historical Provider, MD  albuterol (PROVENTIL HFA;VENTOLIN HFA) 108 (90 BASE) MCG/ACT inhaler Inhale 1-2 puffs into the lungs every 6 (six) hours as needed for wheezing or shortness of breath. Patient not taking: Reported on 04/05/2015 04/03/15   Donne Hazel, MD  budesonide-formoterol [facility]) 80-4.5 MCG/ACT inhaler Inhale 2 puffs into the lungs 2 (two) times daily. Patient not taking: Reported on 04/05/2015 04/03/15   Donne Hazel, MD  predniSONE (DELTASONE) 5 MG tablet Take 1 tablet (5 mg total) by mouth daily with breakfast. Patient not taking: Reported on 04/05/2015 04/03/15   Donne Hazel, MD  Respiratory Therapy Supplies (  NEBULIZER) DEVI 1 Device by Does not apply route as needed. Patient not taking: Reported on 04/05/2015 04/03/15   Donne Hazel, MD   BP 162/88 mmHg  Pulse 99  Temp(Src) 98.5 F (36.9 C) (Oral)  Resp 20  SpO2 100% Physical Exam  Constitutional: He appears well-developed and well-nourished. No distress.  HENT:  Head: Normocephalic and atraumatic.  Right Ear: External ear normal.  Left Ear: External ear normal.  Speaking in a whisper, no stridor with inspiration  Eyes: Conjunctivae are normal. Right eye exhibits no discharge. Left eye exhibits no  discharge. No scleral icterus.  Neck: Neck supple. No tracheal deviation present.  Cardiovascular: Normal rate, regular rhythm and intact distal pulses.   Pulmonary/Chest: Breath sounds normal. Accessory muscle usage present. No stridor. No respiratory distress. He has no decreased breath sounds. He has no wheezes. He has no rhonchi. He has no rales.  Abdominal: Soft. Bowel sounds are normal. He exhibits no distension. There is no tenderness. There is no rebound and no guarding.  Musculoskeletal: He exhibits no edema or tenderness.  Neurological: He is alert. He has normal strength. No cranial nerve deficit (no facial droop, extraocular movements intact, no slurred speech) or sensory deficit. He exhibits normal muscle tone. He displays no seizure activity. Coordination normal.  Skin: Skin is warm and dry. No rash noted.  Psychiatric: He has a normal mood and affect.  Nursing note and vitals reviewed.   ED Course  Procedures (including critical care time) Labs Review Labs Reviewed  BASIC METABOLIC PANEL - Abnormal; Notable for the following:    Sodium 132 (*)    Chloride 99 (*)    Glucose, Bld 111 (*)    Calcium 8.7 (*)    All other components within normal limits  CBC - Abnormal; Notable for the following:    RBC 4.19 (*)    HCT 38.7 (*)    All other components within normal limits  I-STAT ARTERIAL BLOOD GAS, ED - Abnormal; Notable for the following:    pO2, Arterial 68.0 (*)    All other components within normal limits  BRAIN NATRIURETIC PEPTIDE  I-STAT TROPOININ, ED    Imaging Review Dg Chest Port 1 View  04/05/2015   CLINICAL DATA:  Shortness of breath, productive cough.  EXAM: PORTABLE CHEST - 1 VIEW  COMPARISON:  April 02, 2015.  FINDINGS: The heart size and mediastinal contours are within normal limits. Both lungs are clear. No pneumothorax or pleural effusion is noted. The visualized skeletal structures are unremarkable.  IMPRESSION: No acute cardiopulmonary abnormality seen.    Electronically Signed   By: Marijo Conception, M.D.   On: 04/05/2015 07:39     EKG Interpretation   Date/Time:  Friday April 05 2015 07:04:23 EDT Ventricular Rate:  92 PR Interval:  46 QRS Duration: 80 QT Interval:  347 QTC Calculation: 429 R Axis:   77 Text Interpretation:  Sinus rhythm Short PR interval Right atrial  enlargement Artifact in lead(s) I III aVR aVL aVF V1 V6 No significant  change since last tracing Confirmed by Corinthian Mizrahi  MD-J, Keyera Hattabaugh ([id number]) on  04/05/2015 7:25:46 AM     Medications  albuterol (PROVENTIL HFA;VENTOLIN HFA) 108 (90 BASE) MCG/ACT inhaler 1-2 puff (2 puffs Inhalation Given 04/05/15 0948)    MDM   Final diagnoses:  Chronic obstructive pulmonary disease, unspecified COPD, unspecified chronic bronchitis type  Laryngospasm    Patient's symptoms have all resolved. Patient is speaking in full sentences. He does not have  an oxygen requirement. His voice is normal now without any evidence of stridor.  Patient was not able to fill his prescriptions from his recent hospital stay. He does have prescriptions for Symbicort, prednisone as well as a nebulizer machine. Patient goes to the [place] and is planning on going there today so he can get his prescriptions filled.  I think a significant component of his symptoms were related to laryngospasm rather than bronchospasm. I suggested a pulmonary consult. Patient will be going to the [place] for that.  Will give him an inhaler while he is here.  At this time there does not appear to be any evidence of an acute emergency medical condition and the patient appears stable for discharge with appropriate outpatient follow up.     Dorie Rank, MD 04/05/15 [phone]

## 2015-04-05 NOTE — ED Notes (Signed)
Pt. arrived with EMS from home reports worsening SOB with productive cough/chest congestion onset last night , received Solumedrol 125 mg IV , Albuterol 10 mg and Ipatropium 0.5 mg nebulizer treatment by EMS with slight relief . Denies fever or chills. Pt. admitted at [facility] 3 days ago for COPD exacerbation .

## 2015-04-11 ENCOUNTER — Emergency Department (HOSPITAL_COMMUNITY): Payer: Non-veteran care

## 2015-04-11 ENCOUNTER — Encounter (HOSPITAL_COMMUNITY): Payer: Self-pay | Admitting: Emergency Medicine

## 2015-04-11 ENCOUNTER — Emergency Department (HOSPITAL_COMMUNITY): Admission: EM | Admit: 2015-04-11 | Payer: Self-pay | Source: Home / Self Care

## 2015-04-11 ENCOUNTER — Emergency Department (HOSPITAL_COMMUNITY)
Admission: EM | Admit: 2015-04-11 | Discharge: 2015-04-11 | Disposition: A | Discharge status: Home / Self Care | Payer: Non-veteran care | Attending: Emergency Medicine | Admitting: Emergency Medicine

## 2015-04-11 DIAGNOSIS — Z7951 Long term (current) use of inhaled steroids: Secondary | ICD-10-CM | POA: Insufficient documentation

## 2015-04-11 DIAGNOSIS — I10 Essential (primary) hypertension: Secondary | ICD-10-CM | POA: Insufficient documentation

## 2015-04-11 DIAGNOSIS — J441 Chronic obstructive pulmonary disease with (acute) exacerbation: Secondary | ICD-10-CM | POA: Diagnosis not present

## 2015-04-11 DIAGNOSIS — Z9889 Other specified postprocedural states: Secondary | ICD-10-CM | POA: Diagnosis not present

## 2015-04-11 DIAGNOSIS — Z79899 Other long term (current) drug therapy: Secondary | ICD-10-CM | POA: Diagnosis not present

## 2015-04-11 DIAGNOSIS — R0602 Shortness of breath: Secondary | ICD-10-CM | POA: Diagnosis present

## 2015-04-11 LAB — BASIC METABOLIC PANEL
ANION GAP: 6 (ref 5–15)
BUN: 16 mg/dL (ref 6–20)
CO2: 28 mmol/L (ref 22–32)
CREATININE: 0.9 mg/dL (ref 0.61–1.24)
Calcium: 9.1 mg/dL (ref 8.9–10.3)
Chloride: 102 mmol/L (ref 101–111)
GFR calc Af Amer: >60 mL/min (ref >60)
GFR calc non Af Amer: >60 mL/min (ref >60)
Glucose, Bld: 113 mg/dL — ABNORMAL HIGH (ref 65–99)
POTASSIUM: 4.1 mmol/L (ref 3.5–5.1)
Sodium: 136 mmol/L (ref 135–145)

## 2015-04-11 LAB — CBC WITH DIFFERENTIAL/PLATELET
BASOS ABS: 0 10*3/uL (ref 0.0–0.1)
Basophils Relative: 0 % (ref 0–1)
EOS PCT: 17 % — AB (ref 0–5)
Eosinophils Absolute: 1.2 10*3/uL — ABNORMAL HIGH (ref 0.0–0.7)
HCT: 38.5 % — ABNORMAL LOW (ref 39.0–52.0)
Hemoglobin: 13.4 g/dL (ref 13.0–17.0)
Lymphocytes Relative: 28 % (ref 12–46)
Lymphs Abs: 1.9 10*3/uL (ref 0.7–4.0)
MCH: 32.4 pg (ref 26.0–34.0)
MCHC: 34.8 g/dL (ref 30.0–36.0)
MCV: 93.2 fL (ref 78.0–100.0)
MONOS PCT: 8 % (ref 3–12)
Monocytes Absolute: 0.5 10*3/uL (ref 0.1–1.0)
NEUTROS ABS: 3.1 10*3/uL (ref 1.7–7.7)
Neutrophils Relative %: 47 % (ref 43–77)
Platelets: 310 10*3/uL (ref 150–400)
RBC: 4.13 MIL/uL — ABNORMAL LOW (ref 4.22–5.81)
RDW: 13.6 % (ref 11.5–15.5)
WBC: 6.8 10*3/uL (ref 4.0–10.5)

## 2015-04-11 MED ORDER — METHYLPREDNISOLONE SODIUM SUCC 125 MG IJ SOLR
125.0000 mg | Freq: Once | INTRAMUSCULAR | Status: AC
  Administered 2015-04-11: 125 mg via INTRAVENOUS
  Filled 2015-04-11: qty 2

## 2015-04-11 MED ORDER — PREDNISONE 20 MG PO TABS: ORAL_TABLET | ORAL | Status: DC

## 2015-04-11 MED ORDER — MAGNESIUM SULFATE 2 GM/50ML IV SOLN
2.0000 g | Freq: Once | INTRAVENOUS | Status: AC
  Administered 2015-04-11: 2 g via INTRAVENOUS
  Filled 2015-04-11: qty 50

## 2015-04-11 MED ORDER — IPRATROPIUM-ALBUTEROL 0.5-2.5 (3) MG/3ML IN SOLN
3.0000 mL | Freq: Once | RESPIRATORY_TRACT | Status: AC
  Administered 2015-04-11: 3 mL via RESPIRATORY_TRACT
  Filled 2015-04-11: qty 3

## 2015-04-11 MED ORDER — IPRATROPIUM BROMIDE 0.02 % IN SOLN
0.5000 mg | Freq: Once | RESPIRATORY_TRACT | Status: AC
  Administered 2015-04-11: 0.5 mg via RESPIRATORY_TRACT
  Filled 2015-04-11: qty 2.5

## 2015-04-11 MED ORDER — ALBUTEROL (5 MG/ML) CONTINUOUS INHALATION SOLN
10.0000 mg/h | INHALATION_SOLUTION | Freq: Once | RESPIRATORY_TRACT | Status: AC
  Administered 2015-04-11: 10 mg/h via RESPIRATORY_TRACT
  Filled 2015-04-11: qty 20

## 2015-04-11 NOTE — Discharge Instructions (Signed)

## 2015-04-11 NOTE — ED Notes (Signed)
Pt states that woke up with shortness of breath. Pt using accessory muscles, and is having trouble getting complete sentences out. Pt denies chest pain, and reports hx of COPD.

## 2015-04-11 NOTE — ED Provider Notes (Signed)
CSN: [id number]     Arrival date & time 04/11/15  2353 History   First MD Initiated Contact with Patient 04/11/15 0522     Chief Complaint  Patient presents with  . Shortness of Breath     (Consider location/radiation/quality/duration/timing/severity/associated sxs/prior Treatment) Patient is a 64 y.o. male presenting with shortness of breath. The history is provided by the patient.  Shortness of Breath He has a history of COPD and had been admitted to the hospital about a week ago. He was doing well until this evening when he woke up with severe dyspnea and cough productive of some clear sputum. He used an albuterol nebulizer at home with no relief. He denies fever, chills, sweats. He denies chest pain. Nothing makes his breathing better nothing makes it worse.  Past Medical History  Diagnosis Date  . HTN (hypertension)   . Bradycardia, sinus 12/24/11  . Syncope and collapse 12/24/11    2/2 hypotension in the setting of dehydration  . Hypotension 12/24/11    in the setting of dehydration   . Abnormal EKG 12/24/11    anteroseptal and lateral ST elevation, felt r/t early repolarization;  Cardiac cath 12/24/11 - normal coronary anatomy, EF 55-65%  . Marijuana use   . History of alcohol abuse     hospitalized for detox 2002  . COPD (chronic obstructive pulmonary disease)    Past Surgical History  Procedure Laterality Date  . Circumcision  1972  . Laceration repair  02/2004    arthroscopic debridement of triagular fibrocartilage tear/E-chart; right wrist  . Cardiac catheterization  12/24/11    normal coronary anatomy, EF 55-65%  . Left heart catheterization with coronary angiogram N/A 12/24/2011    Procedure: LEFT HEART CATHETERIZATION WITH CORONARY ANGIOGRAM;  Surgeon: Peter M Martinique, MD;  Location: [facility] CATH LAB;  Service: Cardiovascular;  Laterality: N/A;   History reviewed. No pertinent family history. History  Substance Use Topics  . Smoking status: Never Smoker   . Smokeless  tobacco: Never Used  . Alcohol Use: Yes     Comment: "    Review of Systems  Respiratory: Positive for shortness of breath.   All other systems reviewed and are negative.     Allergies  Review of patient's allergies indicates no known allergies.  Home Medications   Prior to Admission medications   Medication Sig Start Date End Date Taking? Authorizing Provider  albuterol (PROVENTIL HFA;VENTOLIN HFA) 108 (90 BASE) MCG/ACT inhaler Inhale 1-2 puffs into the lungs every 6 (six) hours as needed for wheezing or shortness of breath. Patient not taking: Reported on 04/05/2015 04/03/15   Donne Hazel, MD  albuterol (PROVENTIL) (2.5 MG/3ML) 0.083% nebulizer solution Take 3-6 mLs (2.5-5 mg total) by nebulization every 4 (four) hours as needed for wheezing or shortness of breath. 03/12/15   Shanon Rosser, MD  benzonatate (TESSALON) 200 MG capsule Take 1 capsule (200 mg total) by mouth 3 (three) times daily as needed for cough. 03/29/15   Everlene Balls, MD  budesonide-formoterol (SYMBICORT) 80-4.5 MCG/ACT inhaler Inhale 2 puffs into the lungs 2 (two) times daily. Patient not taking: Reported on 04/05/2015 04/03/15   Donne Hazel, MD  lisinopril-hydrochlorothiazide (PRINZIDE,ZESTORETIC) 10-12.5 MG per tablet Take 1 tablet by mouth daily.    Historical Provider, MD  omeprazole (PRILOSEC) 20 MG capsule Take 20 mg by mouth daily.    Historical Provider, MD  predniSONE (DELTASONE) 5 MG tablet Take 1 tablet (5 mg total) by mouth daily with breakfast. Patient not taking:  Reported on 04/05/2015 04/03/15   Donne Hazel, MD  Respiratory Therapy Supplies (NEBULIZER) DEVI 1 Device by Does not apply route as needed. Patient not taking: Reported on 04/05/2015 04/03/15   Donne Hazel, MD   BP 135/78 mmHg  Temp(Src) 98.3 F (36.8 C) (Oral)  Resp 20  SpO2 97% Physical Exam  Nursing note and vitals reviewed.  64 year old male, in mild respiratory distress with use of accessory muscles of respiration. Vital signs  are normal. Oxygen saturation is 97%, which is normal. Head is normocephalic and atraumatic. PERRLA, EOMI. Oropharynx is clear. Neck is nontender and supple without adenopathy or JVD. Back is nontender and there is no CVA tenderness. Lungs have somewhat distant sounds and diffuse expiratory wheezes. There are no rales or rhonchi. Chest is nontender. Heart has regular rate and rhythm without murmur. Abdomen is soft, flat, nontender without masses or hepatosplenomegaly and peristalsis is normoactive. Extremities have no cyanosis or edema, full range of motion is present. Skin is warm and dry without rash. Neurologic: Mental status is normal, cranial nerves are intact, there are no motor or sensory deficits.  ED Course  Procedures (including critical care time) Labs Review Results for orders placed or performed during the hospital encounter of 30/16/01  Basic metabolic panel  Result Value Ref Range   Sodium 136 135 - 145 mmol/L   Potassium 4.1 3.5 - 5.1 mmol/L   Chloride 102 101 - 111 mmol/L   CO2 28 22 - 32 mmol/L   Glucose, Bld 113 (H) 65 - 99 mg/dL   BUN 16 6 - 20 mg/dL   Creatinine, Ser 0.90 0.61 - 1.24 mg/dL   Calcium 9.1 8.9 - 10.3 mg/dL   GFR calc non Af Amer >60 >60 mL/min   GFR calc Af Amer >60 >60 mL/min   Anion gap 6 5 - 15  CBC with Differential  Result Value Ref Range   WBC 6.8 4.0 - 10.5 K/uL   RBC 4.13 (L) 4.22 - 5.81 MIL/uL   Hemoglobin 13.4 13.0 - 17.0 g/dL   HCT 38.5 (L) 39.0 - 52.0 %   MCV 93.2 78.0 - 100.0 fL   MCH 32.4 26.0 - 34.0 pg   MCHC 34.8 30.0 - 36.0 g/dL   RDW 13.6 11.5 - 15.5 %   Platelets 310 150 - 400 K/uL   Neutrophils Relative % 47 43 - 77 %   Neutro Abs 3.1 1.7 - 7.7 K/uL   Lymphocytes Relative 28 12 - 46 %   Lymphs Abs 1.9 0.7 - 4.0 K/uL   Monocytes Relative 8 3 - 12 %   Monocytes Absolute 0.5 0.1 - 1.0 K/uL   Eosinophils Relative 17 (H) 0 - 5 %   Eosinophils Absolute 1.2 (H) 0.0 - 0.7 K/uL   Basophils Relative 0 0 - 1 %   Basophils  Absolute 0.0 0.0 - 0.1 K/uL   Imaging Review Dg Chest Port 1 View  04/11/2015   CLINICAL DATA:  Shortness of breath.  EXAM: PORTABLE CHEST - 1 VIEW  COMPARISON:  Radiographs 04/05/2015.  Chest CT 03/18/2015  FINDINGS: The cardiomediastinal contours are normal. Bronchial thickening and central bronchitic change. Pulmonary vasculature is normal. No consolidation, pleural effusion, or pneumothorax. No acute osseous abnormalities are seen.  IMPRESSION: No acute pulmonary process.  Bronchial thickening is again seen.   Electronically Signed   By: Jeb Levering M.D.   On: 04/11/2015 05:43    Images viewed by me.   EKG Interpretation  Date/Time:  Thursday April 11 2015 05:20:20 EDT Ventricular Rate:  98 PR Interval:  166 QRS Duration: 77 QT Interval:  330 QTC Calculation: 421 R Axis:   87 Text Interpretation:  Sinus rhythm Right atrial enlargement Anterior  infarct, old Minimal ST elevation, inferior leads When compared with ECG  of 04/05/2015, No significant change was found Reconfirmed by [facility]  MD,  Niquan Charnley ([id number]) on 04/11/2015 5:36:34 AM      CRITICAL CARE Performed by: RAQTM,AUQJF Total critical care time: 45 minutes Critical care time was exclusive of separately billable procedures and treating other patients. Critical care was necessary to treat or prevent imminent or life-threatening deterioration. Critical care was time spent personally by me on the following activities: development of treatment plan with patient and/or surrogate as well as nursing, discussions with consultants, evaluation of patient's response to treatment, examination of patient, obtaining history from patient or surrogate, ordering and performing treatments and interventions, ordering and review of laboratory studies, ordering and review of radiographic studies, pulse oximetry and re-evaluation of patient's condition.  MDM   Final diagnoses:  COPD with acute exacerbation    Acute exacerbation of COPD. Old  records are reviewed and he had been discharged on July 20 following inpatient stay for COPD exacerbation, and also had an ED visit on July 22 where he was felt to have laryngospasm. Nebulizer treatment is in progress now and he will be given multiple treatments as needed. He is given a dose of magnesium and a dose of methylprednisolone.  After nebulizer treatment, patient stated he was feeling a little better but he was still using accessory muscles of respiration in stool. Dyspneic. He is placed on an hour-long nebulizer treatment with albuterol and ipratropium. About halfway through this treatment, he seems more comfortable. There is less use of accessory muscles. He is signed out to Dr. Vallery Ridge to evaluate his response to the hour-long treatment. Unless he is 100% back to baseline, he will need to be admitted.  Delora Fuel, MD [id number]

## 2015-04-11 NOTE — ED Provider Notes (Signed)
Patient taken at sign out for Dr. Roxanne Mins. Plan was to follow-up after hour-long neb treatment to determine if patient would be appropriate for discharge. Patient was assessed at approximately 7:30 while taking his nebulizer treatment. He reported having had significant improvement with treatment. Auscultation showed good air flow with minimal wheeze.  Patient is completed neb treatment and is reassessed at 8:30. He is alert and appropriate with no signs of respiratory distress. His breathing is comfortable. Ports he is at baseline and feeling very well. Patient has nebulizer machine at home with reportedly 90 treatments. He reports he has adequate air conditioning in his home and will be able to stay inside and use breathing treatments every 4-6 hours (current heat index greater than 100).  Charlesetta Shanks, MD 04/11/15 [phone]

## 2015-04-11 NOTE — ED Notes (Signed)
Mag Sulfate infusion complete.

## 2015-05-09 ENCOUNTER — Emergency Department (HOSPITAL_COMMUNITY)
Admission: EM | Admit: 2015-05-09 | Discharge: 2015-05-09 | Disposition: A | Discharge status: Home / Self Care | Payer: Non-veteran care | Attending: Emergency Medicine | Admitting: Emergency Medicine

## 2015-05-09 ENCOUNTER — Encounter (HOSPITAL_COMMUNITY): Payer: Self-pay | Admitting: Neurology

## 2015-05-09 DIAGNOSIS — J441 Chronic obstructive pulmonary disease with (acute) exacerbation: Secondary | ICD-10-CM | POA: Diagnosis not present

## 2015-05-09 DIAGNOSIS — R51 Headache: Secondary | ICD-10-CM | POA: Insufficient documentation

## 2015-05-09 DIAGNOSIS — R0602 Shortness of breath: Secondary | ICD-10-CM | POA: Diagnosis present

## 2015-05-09 DIAGNOSIS — Z79899 Other long term (current) drug therapy: Secondary | ICD-10-CM | POA: Diagnosis not present

## 2015-05-09 DIAGNOSIS — I10 Essential (primary) hypertension: Secondary | ICD-10-CM | POA: Insufficient documentation

## 2015-05-09 MED ORDER — IPRATROPIUM-ALBUTEROL 0.5-2.5 (3) MG/3ML IN SOLN
3.0000 mL | Freq: Once | RESPIRATORY_TRACT | Status: AC
  Administered 2015-05-09: 3 mL via RESPIRATORY_TRACT
  Filled 2015-05-09: qty 3

## 2015-05-09 MED ORDER — PREDNISONE 20 MG PO TABS: 40.0000 mg | ORAL_TABLET | Freq: Every day | ORAL | Status: DC

## 2015-05-09 NOTE — Discharge Instructions (Signed)

## 2015-05-09 NOTE — ED Notes (Signed)
Pt reports feeling sob since last night, has hx of copd. Denies cp or any other pain. Reports hx of similar episode and needed breathing treatment. Pt is a x 4.

## 2015-05-09 NOTE — ED Provider Notes (Signed)
CSN: [id number]     Arrival date & time 05/09/15  0750 History   First MD Initiated Contact with Patient 05/09/15 0802     Chief Complaint  Patient presents with  . Shortness of Breath   HPI  [name] [name] is a 64yo male presenting today with shortness of breath since last night. States it feels consistent with prior history of COPD exacerbations. Denies fever, chest pain, cough. Notes wheezing. Also reports mild headache. Has tried albuterol inhaler and nebulizer without effect. No other concerns at this time.  Chart Review shows presentation for same on 7/28 and recent hospitalization and discharge on 7/20. Has history of COPD.  Past Medical History  Diagnosis Date  . HTN (hypertension)   . Bradycardia, sinus 12/24/11  . Syncope and collapse 12/24/11    2/2 hypotension in the setting of dehydration  . Hypotension 12/24/11    in the setting of dehydration   . Abnormal EKG 12/24/11    anteroseptal and lateral ST elevation, felt r/t early repolarization;  Cardiac cath 12/24/11 - normal coronary anatomy, EF 55-65%  . Marijuana use   . History of alcohol abuse     hospitalized for detox 2002  . COPD (chronic obstructive pulmonary disease)    Past Surgical History  Procedure Laterality Date  . Circumcision  1972  . Laceration repair  02/2004    arthroscopic debridement of triagular fibrocartilage tear/E-chart; right wrist  . Cardiac catheterization  12/24/11    normal coronary anatomy, EF 55-65%  . Left heart catheterization with coronary angiogram N/A 12/24/2011    Procedure: LEFT HEART CATHETERIZATION WITH CORONARY ANGIOGRAM;  Surgeon: Peter M Martinique, MD;  Location: [facility] CATH LAB;  Service: Cardiovascular;  Laterality: N/A;   No family history on file. Social History  Substance Use Topics  . Smoking status: Never Smoker   . Smokeless tobacco: Never Used  . Alcohol Use: Yes     Comment: "    Review of Systems  Constitutional: Negative for fever.  Respiratory: Positive for shortness of  breath and wheezing. Negative for cough.   Cardiovascular: Negative for chest pain.  Neurological: Positive for headaches.      Allergies  Review of patient's allergies indicates no known allergies.  Home Medications   Prior to Admission medications   Medication Sig Start Date End Date Taking? Authorizing Provider  acetaminophen (TYLENOL) 500 MG tablet Take 1,000 mg by mouth every 6 (six) hours as needed for mild pain.   Yes Historical Provider, MD  albuterol (PROVENTIL HFA;VENTOLIN HFA) 108 (90 BASE) MCG/ACT inhaler Inhale 1-2 puffs into the lungs every 6 (six) hours as needed for wheezing or shortness of breath. 04/03/15  Yes Donne Hazel, MD  albuterol (PROVENTIL) (2.5 MG/3ML) 0.083% nebulizer solution Take 3-6 mLs (2.5-5 mg total) by nebulization every 4 (four) hours as needed for wheezing or shortness of breath. 03/12/15  Yes John Molpus, MD  benzonatate (TESSALON) 200 MG capsule Take 1 capsule (200 mg total) by mouth 3 (three) times daily as needed for cough. 03/29/15  Yes Everlene Balls, MD  budesonide-formoterol (SYMBICORT) 80-4.5 MCG/ACT inhaler Inhale 2 puffs into the lungs 2 (two) times daily. 04/03/15  Yes Donne Hazel, MD  finasteride (PROSCAR) 5 MG tablet Take 5 mg by mouth daily.   Yes Historical Provider, MD  flunisolide (NASALIDE) 25 MCG/ACT (0.025%) SOLN Place 2 sprays into the nose 2 (two) times daily as needed (nasal congestion).   Yes Historical Provider, MD  guaiFENesin (MUCINEX) 600 MG 12  hr tablet Take by mouth 2 (two) times daily as needed for cough or to loosen phlegm.   Yes Historical Provider, MD  lisinopril-hydrochlorothiazide (PRINZIDE,ZESTORETIC) 10-12.5 MG per tablet Take 1 tablet by mouth daily.   Yes Historical Provider, MD  omeprazole (PRILOSEC) 20 MG capsule Take 20 mg by mouth daily.   Yes Historical Provider, MD  terazosin (HYTRIN) 5 MG capsule Take 5 mg by mouth at bedtime.   Yes Historical Provider, MD  predniSONE (DELTASONE) 20 MG tablet Take 2 tablets  (40 mg total) by mouth daily with breakfast. 05/09/15   Lorna Few, DO  Respiratory Therapy Supplies (NEBULIZER) DEVI 1 Device by Does not apply route as needed. Patient not taking: Reported on 04/05/2015 04/03/15   Donne Hazel, MD   BP 149/84 mmHg  Pulse 94  Temp(Src) 98.3 F (36.8 C) (Oral)  Resp 23  Ht 5\' 6"  (1.676 m)  Wt 143 lb (64.864 kg)  BMI 23.09 kg/m2  SpO2 100% Physical Exam  Constitutional: He appears well-developed and well-nourished. No distress.  HENT:  Head: Normocephalic and atraumatic.  Cardiovascular: Normal rate and regular rhythm.  Exam reveals no gallop and no friction rub.   No murmur heard. Pulmonary/Chest: He has wheezes. He has no rales.  Speaking in complete sentences. Slightly increased work of breathing noted.  Abdominal: Soft. Bowel sounds are normal. He exhibits no distension. There is no tenderness.  Musculoskeletal: He exhibits no edema.  Neurological: He is alert.  Skin: Skin is warm and dry. No rash noted.  Psychiatric: He has a normal mood and affect. His behavior is normal.    ED Course  Procedures (including critical care time) Labs Review Labs Reviewed - No data to display  Imaging Review No results found. I have personally reviewed and evaluated these images and lab results as part of my medical decision-making.   EKG Interpretation   Date/Time:  Thursday May 09 2015 07:59:33 EDT Ventricular Rate:  98 PR Interval:  156 QRS Duration: 82 QT Interval:  329 QTC Calculation: 420 R Axis:   74 Text Interpretation:  Sinus rhythm Right atrial enlargement Otherwise no  significant change since July 2016 Confirmed by Regenia Skeeter  MD, SCOTT ([id number])  on 05/09/2015 8:09:56 AM      MDM   Final diagnoses:  COPD with acute exacerbation  Only mild wheezing noted on initial exam and satting in mid90s. Does appear to be using secondary muscles to breath. Duoneb given with improvement in breathing status. Satting at 99% follow duoneb.  Stable for discharge with PCP follow up. Prescription for steroid burst given.    [place], [place] 05/09/15 0600  Sherwood Gambler, MD 05/13/15 [phone]

## 2015-05-11 ENCOUNTER — Encounter (HOSPITAL_COMMUNITY): Payer: Self-pay

## 2015-05-11 ENCOUNTER — Emergency Department (HOSPITAL_COMMUNITY): Payer: Non-veteran care

## 2015-05-11 ENCOUNTER — Observation Stay (HOSPITAL_COMMUNITY)
Admission: EM | Admit: 2015-05-11 | Discharge: 2015-05-13 | Disposition: A | Discharge status: Home / Self Care | Payer: Non-veteran care | Attending: Internal Medicine | Admitting: Internal Medicine

## 2015-05-11 DIAGNOSIS — N400 Enlarged prostate without lower urinary tract symptoms: Secondary | ICD-10-CM | POA: Diagnosis not present

## 2015-05-11 DIAGNOSIS — R0600 Dyspnea, unspecified: Secondary | ICD-10-CM | POA: Diagnosis present

## 2015-05-11 DIAGNOSIS — J329 Chronic sinusitis, unspecified: Secondary | ICD-10-CM | POA: Insufficient documentation

## 2015-05-11 DIAGNOSIS — J441 Chronic obstructive pulmonary disease with (acute) exacerbation: Secondary | ICD-10-CM | POA: Diagnosis not present

## 2015-05-11 DIAGNOSIS — R9431 Abnormal electrocardiogram [ECG] [EKG]: Secondary | ICD-10-CM | POA: Insufficient documentation

## 2015-05-11 DIAGNOSIS — R05 Cough: Secondary | ICD-10-CM | POA: Diagnosis not present

## 2015-05-11 DIAGNOSIS — R51 Headache: Secondary | ICD-10-CM | POA: Diagnosis not present

## 2015-05-11 DIAGNOSIS — I472 Ventricular tachycardia: Secondary | ICD-10-CM | POA: Insufficient documentation

## 2015-05-11 DIAGNOSIS — I071 Rheumatic tricuspid insufficiency: Secondary | ICD-10-CM | POA: Insufficient documentation

## 2015-05-11 DIAGNOSIS — Z7951 Long term (current) use of inhaled steroids: Secondary | ICD-10-CM | POA: Diagnosis not present

## 2015-05-11 DIAGNOSIS — K219 Gastro-esophageal reflux disease without esophagitis: Secondary | ICD-10-CM | POA: Diagnosis not present

## 2015-05-11 DIAGNOSIS — R0602 Shortness of breath: Secondary | ICD-10-CM | POA: Diagnosis not present

## 2015-05-11 DIAGNOSIS — K5900 Constipation, unspecified: Secondary | ICD-10-CM | POA: Insufficient documentation

## 2015-05-11 DIAGNOSIS — Z79899 Other long term (current) drug therapy: Secondary | ICD-10-CM | POA: Insufficient documentation

## 2015-05-11 DIAGNOSIS — I10 Essential (primary) hypertension: Secondary | ICD-10-CM | POA: Diagnosis not present

## 2015-05-11 DIAGNOSIS — R0609 Other forms of dyspnea: Secondary | ICD-10-CM | POA: Insufficient documentation

## 2015-05-11 LAB — CBC WITH DIFFERENTIAL/PLATELET
BASOS ABS: 0 10*3/uL (ref 0.0–0.1)
BASOS PCT: 0 % (ref 0–1)
EOS PCT: 11 % — AB (ref 0–5)
Eosinophils Absolute: 0.7 10*3/uL (ref 0.0–0.7)
HCT: 37.6 % — ABNORMAL LOW (ref 39.0–52.0)
Hemoglobin: 12.8 g/dL — ABNORMAL LOW (ref 13.0–17.0)
Lymphocytes Relative: 22 % (ref 12–46)
Lymphs Abs: 1.4 10*3/uL (ref 0.7–4.0)
MCH: 31.8 pg (ref 26.0–34.0)
MCHC: 34 g/dL (ref 30.0–36.0)
MCV: 93.3 fL (ref 78.0–100.0)
MONO ABS: 0.5 10*3/uL (ref 0.1–1.0)
Monocytes Relative: 7 % (ref 3–12)
Neutro Abs: 3.7 10*3/uL (ref 1.7–7.7)
Neutrophils Relative %: 60 % (ref 43–77)
PLATELETS: 275 10*3/uL (ref 150–400)
RBC: 4.03 MIL/uL — ABNORMAL LOW (ref 4.22–5.81)
RDW: 13.9 % (ref 11.5–15.5)
WBC: 6.3 10*3/uL (ref 4.0–10.5)

## 2015-05-11 LAB — BASIC METABOLIC PANEL
Anion gap: 9 (ref 5–15)
BUN: 13 mg/dL (ref 6–20)
CALCIUM: 8.9 mg/dL (ref 8.9–10.3)
CO2: 23 mmol/L (ref 22–32)
Chloride: 101 mmol/L (ref 101–111)
Creatinine, Ser: 0.85 mg/dL (ref 0.61–1.24)
GFR calc Af Amer: >60 mL/min (ref >60)
GLUCOSE: 111 mg/dL — AB (ref 65–99)
Potassium: 3.6 mmol/L (ref 3.5–5.1)
Sodium: 133 mmol/L — ABNORMAL LOW (ref 135–145)

## 2015-05-11 LAB — TROPONIN I: Troponin I: <0.03 ng/mL (ref <0.031)

## 2015-05-11 MED ORDER — LISINOPRIL 10 MG PO TABS
10.0000 mg | ORAL_TABLET | Freq: Every day | ORAL | Status: DC
  Administered 2015-05-11 – 2015-05-13 (×3): 10 mg via ORAL
  Filled 2015-05-11 (×3): qty 1

## 2015-05-11 MED ORDER — TERAZOSIN HCL 5 MG PO CAPS
5.0000 mg | ORAL_CAPSULE | Freq: Every day | ORAL | Status: DC
  Administered 2015-05-11 – 2015-05-12 (×2): 5 mg via ORAL
  Filled 2015-05-11 (×3): qty 1

## 2015-05-11 MED ORDER — IPRATROPIUM-ALBUTEROL 0.5-2.5 (3) MG/3ML IN SOLN
3.0000 mL | Freq: Four times a day (QID) | RESPIRATORY_TRACT | Status: DC
  Administered 2015-05-11 – 2015-05-12 (×3): 3 mL via RESPIRATORY_TRACT
  Filled 2015-05-11 (×3): qty 3

## 2015-05-11 MED ORDER — ACETAMINOPHEN 325 MG PO TABS
650.0000 mg | ORAL_TABLET | Freq: Once | ORAL | Status: AC
  Administered 2015-05-11: 650 mg via ORAL
  Filled 2015-05-11: qty 2

## 2015-05-11 MED ORDER — FLUTICASONE PROPIONATE 50 MCG/ACT NA SUSP
2.0000 | Freq: Every day | NASAL | Status: DC | PRN
Start: 2015-05-11 — End: 2015-05-12
  Filled 2015-05-11: qty 16

## 2015-05-11 MED ORDER — HYDROCHLOROTHIAZIDE 12.5 MG PO CAPS
12.5000 mg | ORAL_CAPSULE | Freq: Every day | ORAL | Status: DC
  Administered 2015-05-11 – 2015-05-13 (×3): 12.5 mg via ORAL
  Filled 2015-05-11 (×3): qty 1

## 2015-05-11 MED ORDER — SODIUM CHLORIDE 0.9 % IJ SOLN
3.0000 mL | Freq: Two times a day (BID) | INTRAMUSCULAR | Status: DC
  Administered 2015-05-11 – 2015-05-13 (×5): 3 mL via INTRAVENOUS

## 2015-05-11 MED ORDER — PREDNISONE 20 MG PO TABS
40.0000 mg | ORAL_TABLET | Freq: Every day | ORAL | Status: DC
  Filled 2015-05-11: qty 2

## 2015-05-11 MED ORDER — FLUNISOLIDE 25 MCG/ACT (0.025%) NA SOLN
2.0000 | Freq: Two times a day (BID) | NASAL | Status: DC | PRN
  Filled 2015-05-11: qty 25

## 2015-05-11 MED ORDER — PANTOPRAZOLE SODIUM 40 MG PO TBEC
40.0000 mg | DELAYED_RELEASE_TABLET | Freq: Every day | ORAL | Status: DC
  Administered 2015-05-11 – 2015-05-13 (×3): 40 mg via ORAL
  Filled 2015-05-11 (×3): qty 1

## 2015-05-11 MED ORDER — ENOXAPARIN SODIUM 40 MG/0.4ML ~~LOC~~ SOLN
40.0000 mg | SUBCUTANEOUS | Status: DC
  Administered 2015-05-11 – 2015-05-13 (×3): 40 mg via SUBCUTANEOUS
  Filled 2015-05-11 (×3): qty 0.4

## 2015-05-11 MED ORDER — BUDESONIDE-FORMOTEROL FUMARATE 80-4.5 MCG/ACT IN AERO
2.0000 | INHALATION_SPRAY | Freq: Two times a day (BID) | RESPIRATORY_TRACT | Status: DC
  Administered 2015-05-11 – 2015-05-12 (×2): 2 via RESPIRATORY_TRACT
  Filled 2015-05-11: qty 6.9

## 2015-05-11 MED ORDER — ALBUTEROL (5 MG/ML) CONTINUOUS INHALATION SOLN
10.0000 mg/h | INHALATION_SOLUTION | Freq: Once | RESPIRATORY_TRACT | Status: AC
  Administered 2015-05-11: 10 mg/h via RESPIRATORY_TRACT
  Filled 2015-05-11: qty 20

## 2015-05-11 MED ORDER — FINASTERIDE 5 MG PO TABS
5.0000 mg | ORAL_TABLET | Freq: Every day | ORAL | Status: DC
  Administered 2015-05-11 – 2015-05-13 (×3): 5 mg via ORAL
  Filled 2015-05-11 (×3): qty 1

## 2015-05-11 MED ORDER — IPRATROPIUM BROMIDE 0.02 % IN SOLN
0.5000 mg | Freq: Once | RESPIRATORY_TRACT | Status: AC
Start: 2015-05-11 — End: 2015-05-11
  Administered 2015-05-11: 0.5 mg via RESPIRATORY_TRACT
  Filled 2015-05-11: qty 2.5

## 2015-05-11 MED ORDER — GUAIFENESIN ER 600 MG PO TB12
600.0000 mg | ORAL_TABLET | Freq: Two times a day (BID) | ORAL | Status: DC | PRN
  Administered 2015-05-11 – 2015-05-12 (×3): 600 mg via ORAL
  Filled 2015-05-11 (×3): qty 1

## 2015-05-11 MED ORDER — INFLUENZA VAC SPLIT QUAD 0.5 ML IM SUSY
0.5000 mL | PREFILLED_SYRINGE | INTRAMUSCULAR | Status: AC
  Administered 2015-05-12: 0.5 mL via INTRAMUSCULAR
  Filled 2015-05-11: qty 0.5

## 2015-05-11 MED ORDER — LISINOPRIL-HYDROCHLOROTHIAZIDE 10-12.5 MG PO TABS: 1.0000 | ORAL_TABLET | Freq: Every day | ORAL | Status: DC

## 2015-05-11 NOTE — ED Notes (Signed)
Pt come from home via [facility], pt has been having SOB for the last 12 hrs, with wheezing  hx of COPD, PTA received 5mg  albuterol, 0.5 atrovent, 125 solumedrol, 2mg  Mag.

## 2015-05-11 NOTE — ED Notes (Signed)
Attempted report 

## 2015-05-11 NOTE — ED Notes (Signed)
Admitting at bedside 

## 2015-05-11 NOTE — Progress Notes (Signed)
[name] [name] [id number] Admission Data: 05/11/2015 12:20 PM Attending Provider: Madilyn Fireman, MD  [id number] NOT IN SYSTEM Consults/ Treatment Team:    [name] [name] is a 64 y.o. male patient admitted from ED awake, alert  & orientated  X 3,  Prior, VSS - Blood pressure 157/77, pulse 102, temperature 98 F (36.7 C), temperature source Oral, resp. rate 20, height 5\' 6"  (1.676 m), weight 64 kg (141 lb 1.5 oz), SpO2 95 %., no c/o SOB, no c/o chest pain, no distress noted.   IV site WDL:  forearm right, condition patent and no redness with a transparent dsg that's clean dry and intact.  Allergies:  No Known Allergies   Past Medical History  Diagnosis Date  . HTN (hypertension)   . Bradycardia, sinus 12/24/11  . Syncope and collapse 12/24/11    2/2 hypotension in the setting of dehydration  . Hypotension 12/24/11    in the setting of dehydration   . Abnormal EKG 12/24/11    anteroseptal and lateral ST elevation, felt r/t early repolarization;  Cardiac cath 12/24/11 - normal coronary anatomy, EF 55-65%  . Marijuana use   . History of alcohol abuse     hospitalized for detox 2002  . COPD (chronic obstructive pulmonary disease)     History:  obtained from the patient. Tobacco/alcohol: denied none  Pt orientation to unit, room and routine. Information packet given to patient/family and safety video watched.  Admission INP armband ID verified with patient/family, and in place. SR up x 2, fall risk assessment complete with Patient and family verbalizing understanding of risks associated with falls. Pt verbalizes an understanding of how to use the call bell and to call for help before getting out of bed.  Skin, clean-dry- intact without evidence of bruising, or skin tears.   No evidence of skin break down noted on exam. no rashes, no ecchymoses, no petechiae    Will cont to monitor and assist as needed.  [name] Mondesir Margaretha Sheffield, RN 05/11/2015 12:20 PM

## 2015-05-11 NOTE — ED Provider Notes (Signed)
CSN: [id number]     Arrival date & time 05/11/15  0542 History   First MD Initiated Contact with Patient 05/11/15 [phone]     Chief Complaint  Patient presents with  . Shortness of Breath     (Consider location/radiation/quality/duration/timing/severity/associated sxs/prior Treatment) Patient is a 64 y.o. male presenting with shortness of breath. The history is provided by the patient.  Shortness of Breath He has a history of COPD and had been in the ED 2 days ago with a COPD exacerbation. He states that he never really got back normal but started having increasing difficulty breathing last night. He is used his home inhalers without any benefit. He denies any chest pain and denies fever or chills or cough. There's been no vomiting or diarrhea. He states that he has only been admitted to the hospital once for his COPD and has not been intubated.  Past Medical History  Diagnosis Date  . HTN (hypertension)   . Bradycardia, sinus 12/24/11  . Syncope and collapse 12/24/11    2/2 hypotension in the setting of dehydration  . Hypotension 12/24/11    in the setting of dehydration   . Abnormal EKG 12/24/11    anteroseptal and lateral ST elevation, felt r/t early repolarization;  Cardiac cath 12/24/11 - normal coronary anatomy, EF 55-65%  . Marijuana use   . History of alcohol abuse     hospitalized for detox 2002  . COPD (chronic obstructive pulmonary disease)    Past Surgical History  Procedure Laterality Date  . Circumcision  1972  . Laceration repair  02/2004    arthroscopic debridement of triagular fibrocartilage tear/E-chart; right wrist  . Cardiac catheterization  12/24/11    normal coronary anatomy, EF 55-65%  . Left heart catheterization with coronary angiogram N/A 12/24/2011    Procedure: LEFT HEART CATHETERIZATION WITH CORONARY ANGIOGRAM;  Surgeon: Peter M Martinique, MD;  Location: [facility] CATH LAB;  Service: Cardiovascular;  Laterality: N/A;   No family history on file. Social History   Substance Use Topics  . Smoking status: Never Smoker   . Smokeless tobacco: Never Used  . Alcohol Use: Yes     Comment: "    Review of Systems  Respiratory: Positive for shortness of breath.   All other systems reviewed and are negative.     Allergies  Review of patient's allergies indicates no known allergies.  Home Medications   Prior to Admission medications   Medication Sig Start Date End Date Taking? Authorizing Provider  acetaminophen (TYLENOL) 500 MG tablet Take 1,000 mg by mouth every 6 (six) hours as needed for mild pain.    Historical Provider, MD  albuterol (PROVENTIL HFA;VENTOLIN HFA) 108 (90 BASE) MCG/ACT inhaler Inhale 1-2 puffs into the lungs every 6 (six) hours as needed for wheezing or shortness of breath. 04/03/15   Donne Hazel, MD  albuterol (PROVENTIL) (2.5 MG/3ML) 0.083% nebulizer solution Take 3-6 mLs (2.5-5 mg total) by nebulization every 4 (four) hours as needed for wheezing or shortness of breath. 03/12/15   Shanon Rosser, MD  benzonatate (TESSALON) 200 MG capsule Take 1 capsule (200 mg total) by mouth 3 (three) times daily as needed for cough. 03/29/15   Everlene Balls, MD  budesonide-formoterol (SYMBICORT) 80-4.5 MCG/ACT inhaler Inhale 2 puffs into the lungs 2 (two) times daily. 04/03/15   Donne Hazel, MD  finasteride (PROSCAR) 5 MG tablet Take 5 mg by mouth daily.    Historical Provider, MD  flunisolide (NASALIDE) 25 MCG/ACT (0.025%) SOLN Place  2 sprays into the nose 2 (two) times daily as needed (nasal congestion).    Historical Provider, MD  guaiFENesin (MUCINEX) 600 MG 12 hr tablet Take by mouth 2 (two) times daily as needed for cough or to loosen phlegm.    Historical Provider, MD  lisinopril-hydrochlorothiazide (PRINZIDE,ZESTORETIC) 10-12.5 MG per tablet Take 1 tablet by mouth daily.    Historical Provider, MD  omeprazole (PRILOSEC) 20 MG capsule Take 20 mg by mouth daily.    Historical Provider, MD  predniSONE (DELTASONE) 20 MG tablet Take 2 tablets (40  mg total) by mouth daily with breakfast. 05/09/15   Lorna Few, DO  Respiratory Therapy Supplies (NEBULIZER) DEVI 1 Device by Does not apply route as needed. Patient not taking: Reported on 04/05/2015 04/03/15   Donne Hazel, MD  terazosin (HYTRIN) 5 MG capsule Take 5 mg by mouth at bedtime.    Historical Provider, MD   BP 160/85 mmHg  Pulse 105  Resp 16  Ht 5\' 6"  (1.676 m)  Wt 140 lb (63.504 kg)  BMI 22.61 kg/m2  SpO2 100% Physical Exam  Nursing note and vitals reviewed.  64 year old male, in mild to moderate respiratory distress. He is using accessory muscles of respiration, but is able to speak in complete sentences. Vital signs are significant for tachycardia and hypertension. Oxygen saturation is 100%, which is normal, but was while patient is getting a continuous nebulizer treatment. Head is normocephalic and atraumatic. PERRLA, EOMI. Oropharynx is clear. Neck is nontender and supple without adenopathy or JVD. Back is nontender and there is no CVA tenderness. Lungs have diminished air flow with diffuse expiratory wheezes. There are no rales or rhonchi. Chest is nontender. Heart has regular rate and rhythm without murmur. Abdomen is soft, flat, nontender without masses or hepatosplenomegaly and peristalsis is normoactive. Extremities have no cyanosis or edema, full range of motion is present. Skin is warm and dry without rash. Neurologic: Mental status is normal, cranial nerves are intact, there are no motor or sensory deficits.  ED Course  Procedures (including critical care time) Labs Review Results for orders placed or performed during the hospital encounter of 57/32/20  Basic metabolic panel  Result Value Ref Range   Sodium 133 (L) 135 - 145 mmol/L   Potassium 3.6 3.5 - 5.1 mmol/L   Chloride 101 101 - 111 mmol/L   CO2 23 22 - 32 mmol/L   Glucose, Bld 111 (H) 65 - 99 mg/dL   BUN 13 6 - 20 mg/dL   Creatinine, Ser 0.85 0.61 - 1.24 mg/dL   Calcium 8.9 8.9 - 10.3  mg/dL   GFR calc non Af Amer >60 >60 mL/min   GFR calc Af Amer >60 >60 mL/min   Anion gap 9 5 - 15  CBC with Differential  Result Value Ref Range   WBC 6.3 4.0 - 10.5 K/uL   RBC 4.03 (L) 4.22 - 5.81 MIL/uL   Hemoglobin 12.8 (L) 13.0 - 17.0 g/dL   HCT 37.6 (L) 39.0 - 52.0 %   MCV 93.3 78.0 - 100.0 fL   MCH 31.8 26.0 - 34.0 pg   MCHC 34.0 30.0 - 36.0 g/dL   RDW 13.9 11.5 - 15.5 %   Platelets 275 150 - 400 K/uL   Neutrophils Relative % 60 43 - 77 %   Neutro Abs 3.7 1.7 - 7.7 K/uL   Lymphocytes Relative 22 12 - 46 %   Lymphs Abs 1.4 0.7 - 4.0 K/uL   Monocytes  Relative 7 3 - 12 %   Monocytes Absolute 0.5 0.1 - 1.0 K/uL   Eosinophils Relative 11 (H) 0 - 5 %   Eosinophils Absolute 0.7 0.0 - 0.7 K/uL   Basophils Relative 0 0 - 1 %   Basophils Absolute 0.0 0.0 - 0.1 K/uL  Troponin I  Result Value Ref Range   Troponin I <0.03 <0.031 ng/mL   Imaging Review Dg Chest 2 View  05/11/2015   CLINICAL DATA:  Cough, shortness of breath congestion and sneezing for 2 days.  EXAM: CHEST  2 VIEW  COMPARISON:  04/11/2015  FINDINGS: Chronic hyperinflation and bronchial thickening. The cardiomediastinal contours are normal. Pulmonary vasculature is normal. No consolidation, pleural effusion, or pneumothorax. No acute osseous abnormalities are seen.  IMPRESSION: Chronic hyperinflation bronchial thickening. No acute pulmonary process.   Electronically Signed   By: Jeb Levering M.D.   On: 05/11/2015 06:41   I have personally reviewed and evaluated these images and lab results as part of my medical decision-making.   EKG Interpretation   Date/Time:  Saturday May 11 2015 05:46:03 EDT Ventricular Rate:  103 PR Interval:  158 QRS Duration: 79 QT Interval:  338 QTC Calculation: 442 R Axis:   76 Text Interpretation:  Sinus tachycardia Right atrial enlargement  Anteroseptal infarct, old Borderline ST elevation, lateral leads When  compared with ECG of 05/09/2015, No significant change was found  Confirmed  by [facility]  MD, Llewellyn Choplin ([id number]) on 05/11/2015 6:12:49 AM      CRITICAL CARE Performed by: XYIAX,[email or web address] Total critical care time: 40 minutes Critical care time was exclusive of separately billable procedures and treating other patients. Critical care was necessary to treat or prevent imminent or life-threatening deterioration. Critical care was time spent personally by me on the following activities: development of treatment plan with patient and/or surrogate as well as nursing, discussions with consultants, evaluation of patient's response to treatment, examination of patient, obtaining history from patient or surrogate, ordering and performing treatments and interventions, ordering and review of laboratory studies, ordering and review of radiographic studies, pulse oximetry and re-evaluation of patient's condition.  MDM   Final diagnoses:  COPD exacerbation    COPD exacerbation. Old records are reviewed confirming ED visit 2 days ago. Per RN progress note, he has already received methylprednisolone and magnesium in the ambulance coming in. He is placed on a continuous albuterol nebulizer. With recent ED visit, I anticipate he will need admission.  Following continuous nebulizer treatment, he was moving air better and had less use of accessory muscles but was still using accessory muscles of respiration. It was felt that he was 90 need aggressive ongoing treatment given the fact that he came in this bad while on posterior right prescribed at last ED visit 2 days ago. Case is discussed with internal medicine teaching service resident who agrees to come to admit the patient.    Delora Fuel, MD 48/27/07 [id number]
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
## 2015-05-11 NOTE — H&P (Signed)
Date: 05/11/2015               Patient Name:  [name] [name] MRN: [id number]  DOB: 1951/05/24 Age / Sex: 64 y.o., male   PCP: Dr. Riccardo Dubin, [place] Service: Internal Medicine Teaching Service     I have reviewed the note by Damita Dunnings, MS4 and was present during the interview and physical exam.  Please see below for findings, assessment, and plan.  Chief Complaint: Shortness of Breath  History of Present Illness: Mr. [name] [name] is a 64yo gentleman with a hx of COPD, HTN, who presented by EMS to the ED for increased shortness of breath. The patient states he has a diagnosis of COPD from his specialist at the [facility] (LFTs apparently performed a couple months ago per the patient). The patient has been to the hospital multiple times since since June for episodes of SOB treated with albuterol, breathing treatments, and at times, admitted for antibiotics and steroids for suspected exacerbations (04/02/15). He was seen in the ED on 8/25 with complaints of SOB and given duonebs in with and discharged w/ prednisone course. However, the patient did not get his prednisone prescription filled.  The patient presents today 05/11/15 with increased SOB refractory to his Symbicort and albuterol inhalers. The patient attempted albuterol nebulizer treatments however he states he was coughing too much for the treatment to be successful. When he continued to be short of breath after his attempted treatments, he called EMS. While in the ED, he was given an albuterol and ipratropium with success of diminishing his SOB. He continued to have a significant cough during the physical exam. He denies any sputum production (only once instance during an acute exacerbation earlier in the year). He has not had any fevers, chills, nausea, vomiting, diarrhea or chest pain. The patient endorses orthopnea while sleeping flat and thus needing two pillows. He also states he also experiences the  most shortness of breath on exertion.   The patient also states he has been having sinus issues recently. He has been prescribed nasal spray by doctors at the [facility] for which he takes nightly. He denies any history of post-nasal drip.  Meds: Current Facility-Administered Medications  Medication Dose Route Frequency Provider Last Rate Last Dose  . ipratropium-albuterol (DUONEB) 0.5-2.5 (3) MG/3ML nebulizer solution 3 mL  3 mL Nebulization Q6H Francesca Oman, DO       Current Outpatient Prescriptions  Medication Sig Dispense Refill  . albuterol (PROVENTIL HFA;VENTOLIN HFA) 108 (90 BASE) MCG/ACT inhaler Inhale 1-2 puffs into the lungs every 6 (six) hours as needed for wheezing or shortness of breath. 1 Inhaler 0  . albuterol (PROVENTIL) (2.5 MG/3ML) 0.083% nebulizer solution Take 3-6 mLs (2.5-5 mg total) by nebulization every 4 (four) hours as needed for wheezing or shortness of breath. 30 vial 0  . budesonide-formoterol (SYMBICORT) 80-4.5 MCG/ACT inhaler Inhale 2 puffs into the lungs 2 (two) times daily. 1 Inhaler 0  . finasteride (PROSCAR) 5 MG tablet Take 5 mg by mouth daily.    . flunisolide (NASALIDE) 25 MCG/ACT (0.025%) SOLN Place 2 sprays into the nose 2 (two) times daily as needed (nasal congestion).    Marland Kitchen guaiFENesin (MUCINEX) 600 MG 12 hr tablet Take by mouth 2 (two) times daily as needed for cough or to loosen phlegm.    Marland Kitchen lisinopril-hydrochlorothiazide (PRINZIDE,ZESTORETIC) 10-12.5 MG per tablet Take 1 tablet by mouth daily.    Marland Kitchen  omeprazole (PRILOSEC) 20 MG capsule Take 20 mg by mouth daily.    . predniSONE (DELTASONE) 20 MG tablet Take 2 tablets (40 mg total) by mouth daily with breakfast. 10 tablet 0  . terazosin (HYTRIN) 5 MG capsule Take 5 mg by mouth at bedtime.    . benzonatate (TESSALON) 200 MG capsule Take 1 capsule (200 mg total) by mouth 3 (three) times daily as needed for cough. (Patient not taking: Reported on 05/11/2015) 20 capsule 0  . Respiratory Therapy Supplies (NEBULIZER)  DEVI 1 Device by Does not apply route as needed. (Patient not taking: Reported on 04/05/2015) 1 each 0    Allergies: Allergies as of 05/11/2015  . (No Known Allergies)    Past Medical History: Past Medical History  Diagnosis Date  . HTN (hypertension)   . Bradycardia, sinus 12/24/11  . Syncope and collapse 12/24/11    2/2 hypotension in the setting of dehydration  . Hypotension 12/24/11    in the setting of dehydration   . Abnormal EKG 12/24/11    anteroseptal and lateral ST elevation, felt r/t early repolarization;  Cardiac cath 12/24/11 - normal coronary anatomy, EF 55-65%  . Marijuana use   . History of alcohol abuse     hospitalized for detox 2002  . COPD (chronic obstructive pulmonary disease)     Family History: No family history on file.  Social History: Social History   Social History  . Marital Status: Single    Spouse Name: N/A  . Number of Children: N/A  . Years of Education: N/A   Occupational History  . Not on file.   Social History Main Topics  . Smoking status: Never Smoker   . Smokeless tobacco: Never Used  . Alcohol Use: Yes     Comment: "  . Drug Use: Yes    Special: Marijuana     Comment: 12/24/11 "last time was today"  . Sexual Activity: Not on file   Other Topics Concern  . Not on file   Social History Narrative   He was in foster homes as a child because of his mother's ETOH abuse. Pt also has a history of ETOH abuse and was hospitalized in 2002 for detox.    Surgical History: Past Surgical History  Procedure Laterality Date  . Circumcision  1972  . Laceration repair  02/2004    arthroscopic debridement of triagular fibrocartilage tear/E-chart; right wrist  . Cardiac catheterization  12/24/11    normal coronary anatomy, EF 55-65%  . Left heart catheterization with coronary angiogram N/A 12/24/2011    Procedure: LEFT HEART CATHETERIZATION WITH CORONARY ANGIOGRAM;  Surgeon: Peter M Martinique, MD;  Location: [facility] CATH LAB;  Service:  Cardiovascular;  Laterality: N/A;    Review of System: A comprehensive review of systems was negative unless noted in HPI  Physical Exam: Blood pressure 150/73, pulse 100, resp. rate 22, height 5\' 6"  (1.676 m), weight 63.504 kg (140 lb), SpO2 97 %.  Physical Exam Gen: Well appearing, NAD, sitting up in bed HEENT: MMM, EOMI, no LAD NECK: Supple, no JVD, + upper airway wheezing, occasional accessory muscle usage. CV: RRR, normal S1 S2, no murmurs, rubs, or gallops appreciated. Pulm: Expiratory wheezing primarily in lower lung bases however mild throughout, no crackles appreciated GI: +BS, nondistended, nontender to palpation. Ext: No lower extremity edema. Pulses 2+ bilaterally, warm to touch Neuro: Motor and sensory grossly normal  Labs: Reviewed as noted in the Electronic Record  Imaging: CXR 05/11/15 IMPRESSION: Chronic hyperinflation bronchial  thickening. No acute pulmonary process.  Other results: EKG: unchanged from previous tracings.  Assessment & Plan by Problem:  Kwadwo Taras is a 64 yo gentleman with a hx of COPD and HTN who presents with increased shortness of breath.  Dyspnea: Differential includes COPD exacerbation vs. cardiac etiology (endorsed orthopnea, exertional dyspnea). Increased cough may be due to post-nasal drip (current treated with Flonase) or increase in GERD (treated with PPI). Per patient, history of COPD diagnosed at the [facility] hospital. Patient's vitals are stable on room air. He takes symbicort, albuterol inhaler and nebs at home. Last echo was in 2013 (EF 55-60%) w/ cath showing no vessel occlusion. CXR 8/27 showed no pulmonary issues besides hyperinflation. Patient did not use pred prescription after last d/c. S/p 125 solumedrol by EMS.  - Continue symbicort BID - Duonebs (ipratropium- albuterol) q6h - Echo to assess for any cardiac etiology for shortness of breath on exertion, orthopnea (pulmonary hypertension, decreased EF?). EKG unchanged from  previous.  - consider 5 day total course of steroids  - no antibiotics will be started as this time. No sputum currently produced. - continue PPI, Fluticasone  Hypertension: Elevated upon admission from not taking medication in the AM. Takes lisinopril 10mg  daily, HCTZ 12.5mg  daily. - Continue home medications  GERD: - Continue protonix 40mg  daily.  BPH:  Takes Finasteride 5mg  daily along with terazosin 6mg  at night - continue home meds.  Dispo: Disposition is deferred at this time, awaiting improvement of current medical problems.   The patient does have a current PCP Dr. Riccardo Dubin, [facility] and does not need an [facility] hospital follow-up appointment after discharge.  The patient does not have transportation limitations that hinder transportation to clinic appointments.  Signed: Rolin Barry, Med Student 05/11/2015, 11:55 AM

## 2015-05-12 ENCOUNTER — Observation Stay (HOSPITAL_COMMUNITY): Payer: Non-veteran care

## 2015-05-12 ENCOUNTER — Other Ambulatory Visit: Payer: Self-pay

## 2015-05-12 ENCOUNTER — Other Ambulatory Visit (HOSPITAL_COMMUNITY): Payer: Self-pay

## 2015-05-12 DIAGNOSIS — I472 Ventricular tachycardia: Secondary | ICD-10-CM | POA: Diagnosis not present

## 2015-05-12 DIAGNOSIS — J329 Chronic sinusitis, unspecified: Secondary | ICD-10-CM | POA: Diagnosis not present

## 2015-05-12 DIAGNOSIS — J441 Chronic obstructive pulmonary disease with (acute) exacerbation: Secondary | ICD-10-CM | POA: Diagnosis not present

## 2015-05-12 DIAGNOSIS — I10 Essential (primary) hypertension: Secondary | ICD-10-CM | POA: Diagnosis not present

## 2015-05-12 LAB — COMPREHENSIVE METABOLIC PANEL
ALBUMIN: 3.3 g/dL — AB (ref 3.5–5.0)
ALT: 18 U/L (ref 17–63)
ANION GAP: 8 (ref 5–15)
AST: 18 U/L (ref 15–41)
Alkaline Phosphatase: 52 U/L (ref 38–126)
BILIRUBIN TOTAL: 0.5 mg/dL (ref 0.3–1.2)
BUN: 12 mg/dL (ref 6–20)
CHLORIDE: 100 mmol/L — AB (ref 101–111)
CO2: 26 mmol/L (ref 22–32)
Calcium: 9.2 mg/dL (ref 8.9–10.3)
Creatinine, Ser: 0.75 mg/dL (ref 0.61–1.24)
GFR calc Af Amer: >60 mL/min (ref >60)
Glucose, Bld: 110 mg/dL — ABNORMAL HIGH (ref 65–99)
POTASSIUM: 3.7 mmol/L (ref 3.5–5.1)
Sodium: 134 mmol/L — ABNORMAL LOW (ref 135–145)
TOTAL PROTEIN: 7.1 g/dL (ref 6.5–8.1)

## 2015-05-12 LAB — CBC
HEMATOCRIT: 37.1 % — AB (ref 39.0–52.0)
HEMOGLOBIN: 12.8 g/dL — AB (ref 13.0–17.0)
MCH: 32.5 pg (ref 26.0–34.0)
MCHC: 34.5 g/dL (ref 30.0–36.0)
MCV: 94.2 fL (ref 78.0–100.0)
Platelets: 290 10*3/uL (ref 150–400)
RBC: 3.94 MIL/uL — AB (ref 4.22–5.81)
RDW: 14.1 % (ref 11.5–15.5)
WBC: 9.8 10*3/uL (ref 4.0–10.5)

## 2015-05-12 LAB — EXPECTORATED SPUTUM ASSESSMENT W GRAM STAIN, RFLX TO RESP C

## 2015-05-12 LAB — BLOOD GAS, ARTERIAL
ACID-BASE EXCESS: 1.5 mmol/L (ref 0.0–2.0)
Bicarbonate: 25.3 mEq/L — ABNORMAL HIGH (ref 20.0–24.0)
DRAWN BY: 24513
FIO2: 0.28
O2 SAT: 95.1 %
PATIENT TEMPERATURE: 98.6
PO2 ART: 80 mmHg (ref 80.0–100.0)
TCO2: 26.5 mmol/L (ref 0–100)
pCO2 arterial: 37.9 mmHg (ref 35.0–45.0)
pH, Arterial: 7.44 (ref 7.350–7.450)

## 2015-05-12 LAB — EXPECTORATED SPUTUM ASSESSMENT W REFEX TO RESP CULTURE

## 2015-05-12 LAB — MAGNESIUM: Magnesium: 2.1 mg/dL (ref 1.7–2.4)

## 2015-05-12 LAB — TROPONIN I

## 2015-05-12 MED ORDER — IPRATROPIUM-ALBUTEROL 0.5-2.5 (3) MG/3ML IN SOLN
3.0000 mL | RESPIRATORY_TRACT | Status: DC
  Administered 2015-05-12 – 2015-05-13 (×7): 3 mL via RESPIRATORY_TRACT
  Filled 2015-05-12 (×7): qty 3

## 2015-05-12 MED ORDER — BUDESONIDE-FORMOTEROL FUMARATE 160-4.5 MCG/ACT IN AERO
2.0000 | INHALATION_SPRAY | Freq: Two times a day (BID) | RESPIRATORY_TRACT | Status: DC
  Filled 2015-05-12: qty 6

## 2015-05-12 MED ORDER — ACETAMINOPHEN 325 MG PO TABS: 650.0000 mg | ORAL_TABLET | Freq: Four times a day (QID) | ORAL | Status: DC | PRN

## 2015-05-12 MED ORDER — METHYLPREDNISOLONE SODIUM SUCC 125 MG IJ SOLR
125.0000 mg | Freq: Once | INTRAMUSCULAR | Status: AC
  Administered 2015-05-12: 125 mg via INTRAVENOUS
  Filled 2015-05-12: qty 2

## 2015-05-12 MED ORDER — IPRATROPIUM-ALBUTEROL 0.5-2.5 (3) MG/3ML IN SOLN: 3.0000 mL | RESPIRATORY_TRACT | Status: DC | PRN

## 2015-05-12 MED ORDER — AMOXICILLIN-POT CLAVULANATE 875-125 MG PO TABS
1.0000 | ORAL_TABLET | Freq: Two times a day (BID) | ORAL | Status: AC
  Administered 2015-05-12 (×2): 1 via ORAL
  Filled 2015-05-12 (×2): qty 1

## 2015-05-12 MED ORDER — BUDESONIDE-FORMOTEROL FUMARATE 80-4.5 MCG/ACT IN AERO
2.0000 | INHALATION_SPRAY | Freq: Two times a day (BID) | RESPIRATORY_TRACT | Status: DC
  Administered 2015-05-12 – 2015-05-13 (×2): 2 via RESPIRATORY_TRACT
  Filled 2015-05-12: qty 6.9

## 2015-05-12 MED ORDER — IPRATROPIUM-ALBUTEROL 0.5-2.5 (3) MG/3ML IN SOLN
3.0000 mL | Freq: Once | RESPIRATORY_TRACT | Status: AC
  Administered 2015-05-12: 3 mL via RESPIRATORY_TRACT
  Filled 2015-05-12: qty 3

## 2015-05-12 MED ORDER — METHYLPREDNISOLONE SODIUM SUCC 40 MG IJ SOLR
40.0000 mg | Freq: Two times a day (BID) | INTRAMUSCULAR | Status: DC
  Administered 2015-05-12 – 2015-05-13 (×2): 40 mg via INTRAVENOUS
  Filled 2015-05-12 (×2): qty 1

## 2015-05-12 MED ORDER — METHYLPREDNISOLONE SODIUM SUCC 40 MG IJ SOLR
40.0000 mg | Freq: Once | INTRAMUSCULAR | Status: DC
Start: 2015-05-12 — End: 2015-05-12

## 2015-05-12 MED ORDER — FLUTICASONE PROPIONATE 50 MCG/ACT NA SUSP
2.0000 | Freq: Every day | NASAL | Status: DC
  Administered 2015-05-12 – 2015-05-13 (×2): 2 via NASAL
  Filled 2015-05-12: qty 16

## 2015-05-12 NOTE — Progress Notes (Addendum)
Subjective: Overnight/early AM events noted.  Mr. Ramthun had cough and wheezing refractory to duonebs (nebs would last about 1-2 hours).  He was maintaining good oxygen saturation on RA.  NF team ordered extra duoneb, chest PT, IS and IV solumedrol.  Mr. Bonn was seen and examined this AM.  He is feeling better and feels the IV solumedrol helped the most.  He is breathing comfortably, denies chest pain.  His appetite is good and there is an empty breakfast tray near his sink.  His main complaint this AM is frontal headache that he associates with sinus infection.  He says he had been having rhinorrhea (yellow discharge) that became clear and had recently improved since he was put on Augmentin about 1 week ago.  He has the Augmentin bottle at bedside (his daughter brought it from his home) but says he has not taken any since he was admitted because he was told not to take his home meds in the hospital.  I was unaware he had been prescribed abx for a sinus infection until today.  He has two pill left in the Augmentin bottle.  He says the doctor also prescribed the nasal spray (steroid) and a few other meds that he gets by mail order but they have not been delivered yet.  Objective: Vital signs in last 24 hours: Filed Vitals:   05/11/15 2024 05/12/15 0122 05/12/15 0500 05/12/15 0650  BP: 130/74  132/77   Pulse: 100  100   Temp: 98.6 F (37 C)  98.7 F (37.1 C)   TempSrc: Oral  Oral   Resp: 20  20   Height:      Weight:   141 lb 1.5 oz (64 kg)   SpO2: 98% 98% 98% 100%   Weight change: 1 lb 1.5 oz (0.496 kg)  Intake/Output Summary (Last 24 hours) at 05/12/15 0738 Last data filed at 05/12/15 0444  Gross per 24 hour  Intake    320 ml  Output   1600 ml  Net  -1280 ml    Lab Results: Basic Metabolic Panel:  Recent Labs Lab 05/11/15 0606 05/12/15 0530  NA 133* 134*  K 3.6 3.7  CL 101 100*  CO2 23 26  GLUCOSE 111* 110*  BUN 13 12  CREATININE 0.85 0.75  CALCIUM 8.9 9.2    Liver Function Tests:  Recent Labs Lab 05/12/15 0530  AST 18  ALT 18  ALKPHOS 52  BILITOT 0.5  PROT 7.1  ALBUMIN 3.3*   CBC:  Recent Labs Lab 05/11/15 0606 05/12/15 0530  WBC 6.3 9.8  NEUTROABS 3.7  --   HGB 12.8* 12.8*  HCT 37.6* 37.1*  MCV 93.3 94.2  PLT 275 290   Studies/Results: Dg Chest 2 View  05/12/2015   CLINICAL DATA:  Short of breath for 5 months. History COPD/asthma. Hypertension.  EXAM: CHEST  2 VIEW  COMPARISON:  05/11/2015  FINDINGS: Midline trachea. Normal heart size. Atherosclerosis in the transverse aorta. No pleural effusion or pneumothorax. Mild hyperinflation and central airway thickening. Nodular density projecting over the left mid lung on the frontal radiograph is likely nipple shadow; no pulmonary nodule in this area on the CT of 03/17/2005.  No lobar consolidation.  IMPRESSION: Hyperinflation and mild central airway thickening, suggesting COPD.  No acute cardiopulmonary disease.   Electronically Signed   By: Abigail Miyamoto M.D.   On: 05/12/2015 08:09   Dg Chest 2 View  05/11/2015   CLINICAL DATA:  Cough, shortness of breath  congestion and sneezing for 2 days.  EXAM: CHEST  2 VIEW  COMPARISON:  04/11/2015  FINDINGS: Chronic hyperinflation and bronchial thickening. The cardiomediastinal contours are normal. Pulmonary vasculature is normal. No consolidation, pleural effusion, or pneumothorax. No acute osseous abnormalities are seen.  IMPRESSION: Chronic hyperinflation bronchial thickening. No acute pulmonary process.   Electronically Signed   By: Jeb Levering M.D.   On: 05/11/2015 06:41   Medications: I have reviewed the patient's current medications. Scheduled Meds: . budesonide-formoterol  2 puff Inhalation BID  . enoxaparin (LOVENOX) injection  40 mg Subcutaneous Q24H  . finasteride  5 mg Oral Daily  . lisinopril  10 mg Oral Daily   And  . hydrochlorothiazide  12.5 mg Oral Daily  . Influenza vac split quadrivalent PF  0.5 mL Intramuscular  Tomorrow-1000  . ipratropium-albuterol  3 mL Nebulization Q4H  . pantoprazole  40 mg Oral Daily  . sodium chloride  3 mL Intravenous Q12H  . terazosin  5 mg Oral QHS   Continuous Infusions: none PRN Meds:.fluticasone, guaiFENesin, ipratropium-albuterol Assessment/Plan: 64 year old male with COPD here with dyspnea.  DOE: Likely due to AECOPD given wheezing and good response to steroid.  - prn oxygen supplementation to keep O2 90-92% - increase frequency of duonebs q6h --> q4h - s/p solumedrol 125mg  IV, plan 40mg  IV tonight, can consider starting po pred tomorrow vs continue IV steroid - plan to increase Symbicort to 160/4.5 dose at discharge - no indication for antibotic (no increased sputum, afebrile, no leukocytosis, CXR looks good)  - continue nasal steroid (change from prn to scheduled) - awaiting 2D ECHO interpretation to see if there is a component of HF (HTN hx, DOE, PND, orthopnea)  Sinus infection:  Reportedly dx by his doctor last week and started on Augmentin.  He has two pills left in the bottle.  This along with cough are likely contributing to his headache.  - Augmentin 875-125 BID x 2 doses - Flonase - Tylenol for headache, can try NSAID or Tramadol if no relief with Tylenol - Mucinex prn  Brief VT on telemetry:  6 beats of VT. Unclear if this occurred while he had cough/wheezing.  Mg is normal at 2.1.  Other electrolytes ok. - 2D ECHO as above - continue Telemetry monitoring - AM BMP  Hypertension: Stable. - continue Prinzide - vitals per floor routine  GERD: Potential contributor to his cough.  - Continue PPI.  BPH: Continue Finasteride and Terazosin.  Diet: Regular VTE ppx: Lovenox Code status: Full  Dispo: Disposition is deferred at this time, awaiting improvement of current medical problems.  Anticipated discharge in approximately 1-2 day(s).   The patient does have a current PCP (at [facility]) and does not need an [facility] hospital  follow-up appointment after discharge.  The patient does not know have transportation limitations that hinder transportation to clinic appointments.  .Services Needed at time of discharge: Y = Yes, Blank = No PT:   OT:   RN:   Equipment:   Other:       Francesca Oman, DO 05/12/2015, 7:38 AM

## 2015-05-12 NOTE — Progress Notes (Signed)
Notified by RN that patient complaining of continued cough and wheezing.  Patient satting 98-100% on RA, but notes cough and wheezing returning approximately 1-2 hours after duonebs.  Patient denies fever or pain.  Denies productive cough, though nurse notes one episode of scant, green sputum expectorated into trash can.   Gave patient extra duoneb treatment, and ordered chest physiotherapy and IS.  Patient noted improvement with duoneb, but symptoms recurred at 6 am.  Nursing concerned that patient tiring out.  Filed Vitals:   05/12/15 0500  BP: 132/77  Pulse: 100  Temp: 98.7 F (37.1 C)  Resp: 20   Gen: Thin male sitting up in bed, in moderate distress CV: Tachycardic. Heart sounds distant. No adventitious sounds appreciated. Pulm: Moderately loud coarse rhonchi and wheezes in all lung fields Extr: No peripheral edema  A/P: Unclear cause of acute worsening dyspnea.  Patient still satting 98-100% on RA.   - CXR - aBG - Sputum culture - Solumedrol 125 mg IV  - Duonebs q2hours - Consider antibiotics for acute COPD exacerbation.

## 2015-05-12 NOTE — Progress Notes (Signed)
  Echocardiogram 2D Echocardiogram has been performed.  Johny Chess 05/12/2015, 9:21 AM

## 2015-05-12 NOTE — Progress Notes (Signed)
MD paged that patient had a 6 beat run of vtach.  No complaints of CP.  No new orders given.  Will continue to monitor patient.

## 2015-05-12 NOTE — Progress Notes (Signed)
Patient wheezing and cough.  Cough with small amount of sputum, call to doctor for something to reduce cough.  Will continue to monitor.  Wilson Singer, RN

## 2015-05-13 DIAGNOSIS — B9689 Other specified bacterial agents as the cause of diseases classified elsewhere: Secondary | ICD-10-CM | POA: Diagnosis not present

## 2015-05-13 DIAGNOSIS — J019 Acute sinusitis, unspecified: Secondary | ICD-10-CM | POA: Diagnosis not present

## 2015-05-13 DIAGNOSIS — J441 Chronic obstructive pulmonary disease with (acute) exacerbation: Secondary | ICD-10-CM | POA: Diagnosis not present

## 2015-05-13 LAB — BASIC METABOLIC PANEL
Anion gap: 9 (ref 5–15)
BUN: 13 mg/dL (ref 6–20)
CALCIUM: 9.4 mg/dL (ref 8.9–10.3)
CO2: 26 mmol/L (ref 22–32)
CREATININE: 0.77 mg/dL (ref 0.61–1.24)
Chloride: 99 mmol/L — ABNORMAL LOW (ref 101–111)
GFR calc Af Amer: >60 mL/min (ref >60)
GFR calc non Af Amer: >60 mL/min (ref >60)
GLUCOSE: 130 mg/dL — AB (ref 65–99)
Potassium: 3.9 mmol/L (ref 3.5–5.1)
Sodium: 134 mmol/L — ABNORMAL LOW (ref 135–145)

## 2015-05-13 LAB — MAGNESIUM: Magnesium: 2 mg/dL (ref 1.7–2.4)

## 2015-05-13 MED ORDER — BUDESONIDE-FORMOTEROL FUMARATE 160-4.5 MCG/ACT IN AERO: 2.0000 | INHALATION_SPRAY | Freq: Two times a day (BID) | RESPIRATORY_TRACT | Status: DC

## 2015-05-13 MED ORDER — BUDESONIDE-FORMOTEROL FUMARATE 160-4.5 MCG/ACT IN AERO
2.0000 | INHALATION_SPRAY | Freq: Two times a day (BID) | RESPIRATORY_TRACT | Status: DC
  Filled 2015-05-13: qty 6

## 2015-05-13 MED ORDER — DOCUSATE SODIUM 100 MG PO CAPS
100.0000 mg | ORAL_CAPSULE | Freq: Two times a day (BID) | ORAL | Status: DC
  Administered 2015-05-13: 100 mg via ORAL
  Filled 2015-05-13: qty 1

## 2015-05-13 MED ORDER — POLYETHYLENE GLYCOL 3350 17 G PO PACK
17.0000 g | PACK | Freq: Every day | ORAL | Status: DC
  Administered 2015-05-13: 17 g via ORAL

## 2015-05-13 MED ORDER — PREDNISONE 20 MG PO TABS: 40.0000 mg | ORAL_TABLET | Freq: Every day | ORAL | Status: AC

## 2015-05-13 MED ORDER — IPRATROPIUM-ALBUTEROL 0.5-2.5 (3) MG/3ML IN SOLN
3.0000 mL | Freq: Four times a day (QID) | RESPIRATORY_TRACT | Status: DC
  Administered 2015-05-13: 3 mL via RESPIRATORY_TRACT
  Filled 2015-05-13 (×2): qty 3

## 2015-05-13 NOTE — Discharge Summary (Signed)
Name: [name] [name] MRN: [id number] DOB: [birth year] 64 y.o. PCP: Provider Not In System  Date of Admission: 05/11/2015  5:42 AM Date of Discharge: 05/13/2015 Attending Physician: Madilyn Fireman, MD  Discharge Diagnosis: 1. COPD exacerbation  Active Problems:   COPD with acute exacerbation  Discharge Medications:   Medication List    STOP taking these medications        budesonide-formoterol 80-4.5 MCG/ACT inhaler  Commonly known as:  SYMBICORT  Replaced by:  budesonide-formoterol 160-4.5 MCG/ACT inhaler      TAKE these medications        albuterol (2.5 MG/3ML) 0.083% nebulizer solution  Commonly known as:  PROVENTIL  Take 3-6 mLs (2.5-5 mg total) by nebulization every 4 (four) hours as needed for wheezing or shortness of breath.     albuterol 108 (90 BASE) MCG/ACT inhaler  Commonly known as:  PROVENTIL HFA;VENTOLIN HFA  Inhale 1-2 puffs into the lungs every 6 (six) hours as needed for wheezing or shortness of breath.     benzonatate 200 MG capsule  Commonly known as:  TESSALON  Take 1 capsule (200 mg total) by mouth 3 (three) times daily as needed for cough.     budesonide-formoterol 160-4.5 MCG/ACT inhaler  Commonly known as:  SYMBICORT  Inhale 2 puffs into the lungs 2 (two) times daily.     finasteride 5 MG tablet  Commonly known as:  PROSCAR  Take 5 mg by mouth daily.     flunisolide 25 MCG/ACT (0.025%) Soln  Commonly known as:  NASALIDE  Place 2 sprays into the nose 2 (two) times daily as needed (nasal congestion).     guaiFENesin 600 MG 12 hr tablet  Commonly known as:  MUCINEX  Take by mouth 2 (two) times daily as needed for cough or to loosen phlegm.     lisinopril-hydrochlorothiazide 10-12.5 MG per tablet  Commonly known as:  PRINZIDE,ZESTORETIC  Take 1 tablet by mouth daily.     Nebulizer Devi  1 Device by Does not apply route as needed.     omeprazole 20 MG capsule  Commonly known as:  PRILOSEC  Take 20 mg by mouth daily.     predniSONE  20 MG tablet  Commonly known as:  DELTASONE  Take 2 tablets (40 mg total) by mouth daily with breakfast.  Start taking on:  05/14/2015     terazosin 5 MG capsule  Commonly known as:  HYTRIN  Take 5 mg by mouth at bedtime.        Disposition and follow-up:   [name] [name] was discharged from [facility] in Good condition.  At the hospital follow up visit please address:  1.  COPD exacerbation/COPD maintenance medications  2. Cough - The patient's cough is likely multifactorial (COPD, GERD, lisinopril?). If cough continues,  please rule out medication side effects as appropriate  Follow-up Appointments: Patient will schedule follow up appointment himself at [facility] hospital   Discharge Instructions:     Discharge Instructions    Call MD for:  difficulty breathing, headache or visual disturbances    Complete by:  As directed      Call MD for:  extreme fatigue    Complete by:  As directed      Call MD for:  hives    Complete by:  As directed      Call MD for:  persistant dizziness or light-headedness    Complete by:  As directed      Call MD for:  persistant nausea and vomiting    Complete by:  As directed      Call MD for:  redness, tenderness, or signs of infection (pain, swelling, redness, odor or green/yellow discharge around incision site)    Complete by:  As directed      Call MD for:  severe uncontrolled pain    Complete by:  As directed      Call MD for:  temperature >100.4    Complete by:  As directed      Diet - low sodium heart healthy    Complete by:  As directed      Increase activity slowly    Complete by:  As directed            Consultations:  None  Procedures Performed:  Dg Chest 2 View  05/12/2015   CLINICAL DATA:  Short of breath for 5 months. History COPD/asthma. Hypertension.  EXAM: CHEST  2 VIEW  COMPARISON:  05/11/2015  FINDINGS: Midline trachea. Normal heart size. Atherosclerosis in the transverse aorta. No pleural effusion or  pneumothorax. Mild hyperinflation and central airway thickening. Nodular density projecting over the left mid lung on the frontal radiograph is likely nipple shadow; no pulmonary nodule in this area on the CT of 03/17/2005.  No lobar consolidation.  IMPRESSION: Hyperinflation and mild central airway thickening, suggesting COPD.  No acute cardiopulmonary disease.   Electronically Signed   By: Abigail Miyamoto M.D.   On: 05/12/2015 08:09   Dg Chest 2 View  05/11/2015   CLINICAL DATA:  Cough, shortness of breath congestion and sneezing for 2 days.  EXAM: CHEST  2 VIEW  COMPARISON:  04/11/2015  FINDINGS: Chronic hyperinflation and bronchial thickening. The cardiomediastinal contours are normal. Pulmonary vasculature is normal. No consolidation, pleural effusion, or pneumothorax. No acute osseous abnormalities are seen.  IMPRESSION: Chronic hyperinflation bronchial thickening. No acute pulmonary process.   Electronically Signed   By: Jeb Levering M.D.   On: 05/11/2015 06:41    2D Echo: 05/12/2015  - Left ventricle: The cavity size was normal. Systolic function was vigorous. The estimated ejection fraction was in the range of 65% to 70%. Wall motion was normal; there were no regional wall motion abnormalities. - Aortic valve: Trileaflet; mildly thickened, mildly calcified leaflets.  Admission HPI:  [name] [name] is a 64 year old male with PMH of COPD/asthma and HTN here with c/o dyspnea and wheezing for several months but worse last night. He reports compliance with Symbicort and uses rescue inhaler and nebulizer prn but did not find relief last night. He reports majority of dyspnea is with exertion but also occurs at night. He describes two pillow orthopnea but denies chest pain or LE edema. He also denies fever, chills, sputum production, hemoptysis or hx of VTE. He says he saw a specialist at the [place] who told him he has sinus problems and prescribed nasal spray. He has been to ED several times  with complaint of dyspnea and was most recently in ED on 08/25. He improved with duonebs at that visit and was discharged with prednisone but never picked up the rx. His symptoms improved today after steroid and duonebs.   Hospital Course by problem list: Active Problems:   COPD with acute exacerbation   1. Dyspnea: The patient's increased SOB and cough were associated with a mild COPD exacerbation most likely continued from his previous ED visit. He was given solumedrol while in the EMS to the hospital. He received Duoneb breathing treatments  with great success. There was a question regarding the true etiology of his SOB (pulmonary vs cardiac) since he endorsed some orthopnea and dyspnea on exertion. Echo showed EF 65-70%. EKGs were unremarkable. He experienced one episode of cough and wheezing refractory to duonebs. Steroids and breathing treatments were increased with success. His physical exam was significant improved at discharge. He will be discharged on an increased dose of Symbicort along with a 5 day (total) prescription of steroids (40mg  daily).   2. Hypertension: While elevated upon admission due to not taking his AM meds, the patient's blood pressure remained stable. He will be continued on his home lisinopril and HCTZ.  3. GERD: The patient was continued on his home protonix 40mg  daily.  4. BPH: The patient takes finesteride 5mg  daily and terazosin 6mg  at night. We continued him on his home medication.  5. Constipation: The patient did not have a bowel movement during admission. He was given Miralax and docusate at discharge.  Discharge Vitals:   BP 125/79 mmHg  Pulse 112  Temp(Src) 98.2 F (36.8 C) (Oral)  Resp 20  Ht 5\' 6"  (1.676 m)  Wt 64 kg (141 lb 1.5 oz)  BMI 22.78 kg/m2  SpO2 95%   Physical Exam Gen: Well appearing, NAD HEENT: MMM, EOMI CV: RRR, normal S1 S2, no murmurs, rubs, or gallops appreciated. Pulm: Continued wheezing in LLL, however significantly  decreased from previous days. GI: +BS, nondistended, nontender to palpation Ext: Pulses 2+ bilaterally, warm to touch Neuro: Motor and sensory grossly normal  Discharge Labs:  Results for orders placed or performed during the hospital encounter of 05/11/15 (from the past 24 hour(s))  Troponin I     Status: None   Collection Time: 05/12/15  2:05 PM  Result Value Ref Range   Troponin I <0.03 <0.031 ng/mL  Basic metabolic panel     Status: Abnormal   Collection Time: 05/13/15  3:46 AM  Result Value Ref Range   Sodium 134 (L) 135 - 145 mmol/L   Potassium 3.9 3.5 - 5.1 mmol/L   Chloride 99 (L) 101 - 111 mmol/L   CO2 26 22 - 32 mmol/L   Glucose, Bld 130 (H) 65 - 99 mg/dL   BUN 13 6 - 20 mg/dL   Creatinine, Ser 0.77 0.61 - 1.24 mg/dL   Calcium 9.4 8.9 - 10.3 mg/dL   GFR calc non Af Amer >60 >60 mL/min   GFR calc Af Amer >60 >60 mL/min   Anion gap 9 5 - 15  Magnesium     Status: None   Collection Time: 05/13/15  3:46 AM  Result Value Ref Range   Magnesium 2.0 1.7 - 2.4 mg/dL    Signed: Rolin Barry, Med Student 05/13/2015, 12:38 PM

## 2015-05-13 NOTE — Progress Notes (Signed)
  Subjective: No acute events overnight. The patient has been feeling well since the increase in steroids and duonebs yesterday. He continues to endorse a dry cough. He feels great, has been out of bed walking around, and is looking forward to going home. The patient was constipated this morning and has not had a bowel movement in 5 days.  Objective: Vital signs in last 24 hours: Filed Vitals:   05/12/15 1953 05/12/15 2129 05/13/15 0527 05/13/15 0800  BP:  125/60 125/79   Pulse:  108 112   Temp:  98.5 F (36.9 C) 98.2 F (36.8 C)   TempSrc:  Oral Oral   Resp:  18 20   Height:      Weight:      SpO2: 98% 97% 98% 95%   Weight change:   Intake/Output Summary (Last 24 hours) at 05/13/15 1140 Last data filed at 05/13/15 1054  Gross per 24 hour  Intake    600 ml  Output   1400 ml  Net   -800 ml    Physical Exam Gen: Well appearing, NAD HEENT: MMM, EOMI CV: RRR, normal S1 S2, no murmurs, rubs, or gallops appreciated. Pulm: Continued wheezing in LLL, however significantly decreased from previous days. GI: +BS, nondistended, nontender to palpation Ext: Pulses 2+ bilaterally, warm to touch Neuro: Motor and sensory grossly normal  Lab Results: Labs reviewed in Epic  Imaging Studies/Results: Imaging reviewed in Epic  Medications:  Medication reviewed in Epic  Assessment/Plan: 64 year old male with COPD here with dyspnea.  DOE: Likely due to AECOPD given wheezing and good response to steroid. Echo 8/28 EF 65-70%. - prn oxygen supplementation to keep O2 90-92%. Does not use oxygen at home.  - duonebs q6h w/ q2h prn while in hospital.  - s/p solumedrol 125mg  IV, 40mg  IV 8/29 AM, followed by PO for discharge. Will give paper prescription so can fill immediately. - plan to increase Symbicort to 160/4.5 dose at discharge - no indication for antibotic (no increased sputum, afebrile, no leukocytosis, CXR looks good)  - continue nasal steroid (change from prn to  scheduled)  Sinus infection: Reportedly dx by his doctor last week and started on Augmentin. He has two pills left in the bottle. This along with cough are likely contributing to his headache.  - s/p Augmentin 875-125 BID x 2 doses on 8/28, none for discharge. - Flonase - Tylenol for headache, can try NSAID or Tramadol if no relief with Tylenol - Mucinex prn  Brief VT on telemetry: STABLE: 6 beats of VT. Unclear if this occurred while he had cough/wheezing. Mg is normal at 2.1. Other electrolytes ok. - 2D ECHO as above unremarkable.  Hypertension: Stable. - continue Prinzide - vitals per floor routine  GERD: Potential contributor to his cough.  - Continue PPI.  BPH: Continue Finasteride and Terazosin.   Diet: Regular, Miralax, senna, docusate for constipation VTE ppx: Lovenox Code status: Full  Dispo: Discharge  The patient does have a current PCP (at [facility]) and does not need an [facility] hospital follow-up appointment after discharge.  The patient does not know have transportation limitations that hinder transportation to clinic appointments.  This is a Careers information officer Note.  The care of the patient was discussed with Dr. Redmond Pulling and the assessment and plan formulated with their assistance.  Please see their attached note for official documentation of the daily encounter.    Rolin Barry, Med Student 05/13/2015, 11:40 AM

## 2015-05-13 NOTE — Progress Notes (Signed)
SATURATION QUALIFICATIONS: (This note is used to comply with regulatory documentation for home oxygen) ° °Patient Saturations on Room Air at Rest = 94% ° °Patient Saturations on Room Air while Ambulating = 94% ° °

## 2015-05-13 NOTE — Progress Notes (Signed)
Nsg Discharge  Admit Date:  05/11/2015 Discharge date: 05/13/2015   [name] to be D/C'd Home per MD order.  AVS completed.  Copy for chart, and copy for patient signed, and dated. Patient/caregiver able to verbalize understanding.  Discharge Medication:   Medication List    STOP taking these medications        budesonide-formoterol 80-4.5 MCG/ACT inhaler  Commonly known as:  SYMBICORT  Replaced by:  budesonide-formoterol 160-4.5 MCG/ACT inhaler      TAKE these medications        albuterol (2.5 MG/3ML) 0.083% nebulizer solution  Commonly known as:  PROVENTIL  Take 3-6 mLs (2.5-5 mg total) by nebulization every 4 (four) hours as needed for wheezing or shortness of breath.     albuterol 108 (90 BASE) MCG/ACT inhaler  Commonly known as:  PROVENTIL HFA;VENTOLIN HFA  Inhale 1-2 puffs into the lungs every 6 (six) hours as needed for wheezing or shortness of breath.     benzonatate 200 MG capsule  Commonly known as:  TESSALON  Take 1 capsule (200 mg total) by mouth 3 (three) times daily as needed for cough.     budesonide-formoterol 160-4.5 MCG/ACT inhaler  Commonly known as:  SYMBICORT  Inhale 2 puffs into the lungs 2 (two) times daily.     finasteride 5 MG tablet  Commonly known as:  PROSCAR  Take 5 mg by mouth daily.     flunisolide 25 MCG/ACT (0.025%) Soln  Commonly known as:  NASALIDE  Place 2 sprays into the nose 2 (two) times daily as needed (nasal congestion).     guaiFENesin 600 MG 12 hr tablet  Commonly known as:  MUCINEX  Take by mouth 2 (two) times daily as needed for cough or to loosen phlegm.     lisinopril-hydrochlorothiazide 10-12.5 MG per tablet  Commonly known as:  PRINZIDE,ZESTORETIC  Take 1 tablet by mouth daily.     Nebulizer Devi  1 Device by Does not apply route as needed.     omeprazole 20 MG capsule  Commonly known as:  PRILOSEC  Take 20 mg by mouth daily.     predniSONE 20 MG tablet  Commonly known as:  DELTASONE  Take 2 tablets (40  mg total) by mouth daily with breakfast.  Start taking on:  05/14/2015     terazosin 5 MG capsule  Commonly known as:  HYTRIN  Take 5 mg by mouth at bedtime.        Discharge Assessment: Filed Vitals:   05/13/15 1337  BP: 117/70  Pulse: 103  Temp: 97.7 F (36.5 C)  Resp: 18  Skin clean, dry and intact without evidence of skin break down, no evidence of skin tears noted. IV catheter discontinued intact. Site without signs and symptoms of complications - no redness or edema noted at insertion site, patient denies c/o pain - only slight tenderness at site.  Dressing with slight pressure applied.  D/c Instructions-Education: Discharge instructions given to patient/family with verbalized understanding. D/c education completed with patient/family including follow up instructions, medication list, d/c activities limitations if indicated, with other d/c instructions as indicated by MD - patient able to verbalize understanding, all questions fully answered. Patient instructed to return to ED, call 911, or call MD for any changes in condition.  Patient escorted via [place], and D/C home via private auto.  Salley Slaughter, RN 05/13/2015 4:07 PM

## 2015-05-13 NOTE — Discharge Summary (Signed)
Name: [name] [name] MRN: [id number] DOB: [birth year] 64 y.o. PCP: Dr. Meyer Russel, [facility]  Date of Admission: 05/11/2015  5:42 AM Date of Discharge: 05/13/2015 Attending Physician: Larey Dresser, MD  Discharge Diagnosis: 1. COPD exacerbation 2. Sinusitis   Discharge Medications:   Medication List    STOP taking these medications        budesonide-formoterol 80-4.5 MCG/ACT inhaler  Commonly known as:  SYMBICORT  Replaced by:  budesonide-formoterol 160-4.5 MCG/ACT inhaler      TAKE these medications        albuterol (2.5 MG/3ML) 0.083% nebulizer solution  Commonly known as:  PROVENTIL  Take 3-6 mLs (2.5-5 mg total) by nebulization every 4 (four) hours as needed for wheezing or shortness of breath.     albuterol 108 (90 BASE) MCG/ACT inhaler  Commonly known as:  PROVENTIL HFA;VENTOLIN HFA  Inhale 1-2 puffs into the lungs every 6 (six) hours as needed for wheezing or shortness of breath.     benzonatate 200 MG capsule  Commonly known as:  TESSALON  Take 1 capsule (200 mg total) by mouth 3 (three) times daily as needed for cough.     budesonide-formoterol 160-4.5 MCG/ACT inhaler  Commonly known as:  SYMBICORT  Inhale 2 puffs into the lungs 2 (two) times daily.     finasteride 5 MG tablet  Commonly known as:  PROSCAR  Take 5 mg by mouth daily.     flunisolide 25 MCG/ACT (0.025%) Soln  Commonly known as:  NASALIDE  Place 2 sprays into the nose 2 (two) times daily as needed (nasal congestion).     guaiFENesin 600 MG 12 hr tablet  Commonly known as:  MUCINEX  Take by mouth 2 (two) times daily as needed for cough or to loosen phlegm.     lisinopril-hydrochlorothiazide 10-12.5 MG per tablet  Commonly known as:  PRINZIDE,ZESTORETIC  Take 1 tablet by mouth daily.     Nebulizer Devi  1 Device by Does not apply route as needed.     omeprazole 20 MG capsule  Commonly known as:  PRILOSEC  Take 20 mg by mouth daily.     predniSONE 20 MG tablet  Commonly known  as:  DELTASONE  Take 2 tablets (40 mg total) by mouth daily with breakfast.     terazosin 5 MG capsule  Commonly known as:  HYTRIN  Take 5 mg by mouth at bedtime.        Disposition and follow-up:   GregoryForrester Arthurs was discharged from [facility] in Good condition.  At the hospital follow up visit please address:  1.  COPD exacerbation/COPD maintenance medications (is he using higher dose Symbicort?)  2. Cough - The patient's cough is likely multifactorial (COPD, GERD, lisinopril?). If cough continues, consider trial off of ACEI.  Follow-up Appointments: Patient will schedule follow up appointment himself at [facility] hospital within 1 week of hospital discharge.  Discharge Instructions: Discharge Instructions    Call MD for:  difficulty breathing, headache or visual disturbances    Complete by:  As directed      Call MD for:  extreme fatigue    Complete by:  As directed      Call MD for:  hives    Complete by:  As directed      Call MD for:  persistant dizziness or light-headedness    Complete by:  As directed      Call MD for:  persistant nausea and vomiting    Complete  by:  As directed      Call MD for:  redness, tenderness, or signs of infection (pain, swelling, redness, odor or green/yellow discharge around incision site)    Complete by:  As directed      Call MD for:  severe uncontrolled pain    Complete by:  As directed      Call MD for:  temperature >100.4    Complete by:  As directed      Diet - low sodium heart healthy    Complete by:  As directed      Increase activity slowly    Complete by:  As directed            Consultations:  None  Procedures Performed:  Dg Chest 2 View  05/12/2015   CLINICAL DATA:  Short of breath for 5 months. History COPD/asthma. Hypertension.  EXAM: CHEST  2 VIEW  COMPARISON:  05/11/2015  FINDINGS: Midline trachea. Normal heart size. Atherosclerosis in the transverse aorta. No pleural effusion or pneumothorax. Mild  hyperinflation and central airway thickening. Nodular density projecting over the left mid lung on the frontal radiograph is likely nipple shadow; no pulmonary nodule in this area on the CT of 03/17/2005.  No lobar consolidation.  IMPRESSION: Hyperinflation and mild central airway thickening, suggesting COPD.  No acute cardiopulmonary disease.   Electronically Signed   By: Abigail Miyamoto M.D.   On: 05/12/2015 08:09   Dg Chest 2 View  05/11/2015   CLINICAL DATA:  Cough, shortness of breath congestion and sneezing for 2 days.  EXAM: CHEST  2 VIEW  COMPARISON:  04/11/2015  FINDINGS: Chronic hyperinflation and bronchial thickening. The cardiomediastinal contours are normal. Pulmonary vasculature is normal. No consolidation, pleural effusion, or pneumothorax. No acute osseous abnormalities are seen.  IMPRESSION: Chronic hyperinflation bronchial thickening. No acute pulmonary process.   Electronically Signed   By: Jeb Levering M.D.   On: 05/11/2015 06:41    2D Echo: 05/12/2015  - Left ventricle: The cavity size was normal. Systolic function was vigorous. The estimated ejection fraction was in the range of 65% to 70%. Wall motion was normal; there were no regional wall motion abnormalities. - Aortic valve: Trileaflet; mildly thickened, mildly calcified leaflets.  Admission HPI:  [name] [name] is a 64 year old male with PMH of COPD/asthma and HTN here with c/o dyspnea and wheezing for several months but worse last night. He reports compliance with Symbicort and uses rescue inhaler and nebulizer prn but did not find relief last night. He reports majority of dyspnea is with exertion but also occurs at night. He describes two pillow orthopnea but denies chest pain or LE edema. He also denies fever, chills, sputum production, hemoptysis or hx of VTE. He says he saw a specialist at the [place] who told him he has sinus problems and prescribed nasal spray. He has been to ED several times with complaint of  dyspnea and was most recently in ED on 08/25. He improved with duonebs at that visit and was discharged with prednisone but never picked up the rx. His symptoms improved today after steroid and duonebs.   Hospital Course by problem list:   1. Mild AECOPD: The patient's increased SOB and cough were most likely due to mild COPD exacerbation given his relatively mild symptoms and excellent oxygen saturations on room air. He had good response to IV solumedrol and duonebs.  Given his mild symptoms and clear CXR, no antibiotics were given.  There was a question  regarding the true etiology of his SOB (pulmonary vs cardiac) since he reported some orthopnea and dyspnea on exertion.  2D ECHO was performed to evaluated cardiac structure and function.  It revealed an EF of 65-70%.  He had slight diffuse ST elevations that have been present on prior EKGs, however he had no chest pain and his troponin were negative making ACS unlikely.  His symptoms improved during admission and he will be discharged on an increased dose of Symbicort and an additional two days of po steroid (to complete a five day course).  He will arrange a follow-up visit with his PCP at the [place].  2. Sinusitis:  When he was admitted, he was just finishing up a course of Augmentin for sinusitis diagnosed by his PCP a week earlier.  He reported yellow nasal discharge that had gradually resolved since he started Augmentin.  He had no rhinorrhea during admission but did report a slight headache.  He was given Tylenol for his headache, he was given his last two doses of Augmentin, as well as nasal steroids and cough medicine.    Discharge Vitals:   BP 125/79 mmHg  Pulse 112  Temp(Src) 98.2 F (36.8 C) (Oral)  Resp 20  Ht 5\' 6"  (1.676 m)  Wt 64 kg (141 lb 1.5 oz)  BMI 22.78 kg/m2  SpO2 95%   Discharge Labs:  Results for orders placed or performed during the hospital encounter of 05/11/15 (from the past 24 hour(s))  Troponin I     Status: None     Collection Time: 05/12/15  2:05 PM  Result Value Ref Range   Troponin I <0.03 <0.031 ng/mL  Basic metabolic panel     Status: Abnormal   Collection Time: 05/13/15  3:46 AM  Result Value Ref Range   Sodium 134 (L) 135 - 145 mmol/L   Potassium 3.9 3.5 - 5.1 mmol/L   Chloride 99 (L) 101 - 111 mmol/L   CO2 26 22 - 32 mmol/L   Glucose, Bld 130 (H) 65 - 99 mg/dL   BUN 13 6 - 20 mg/dL   Creatinine, Ser 0.77 0.61 - 1.24 mg/dL   Calcium 9.4 8.9 - 10.3 mg/dL   GFR calc non Af Amer >60 >60 mL/min   GFR calc Af Amer >60 >60 mL/min   Anion gap 9 5 - 15  Magnesium     Status: None   Collection Time: 05/13/15  3:46 AM  Result Value Ref Range   Magnesium 2.0 1.7 - 2.4 mg/dL    Signed: Duwaine Maxin, DO 05/13/2015, 1:19 PM

## 2015-05-13 NOTE — Discharge Instructions (Addendum)
1. Please see your doctor at the [facility] in the next week.     2. Please take all medications as prescribed.  I increased your Symbicort dose from 80/4.5 to 160/4.5.  Please STOP using the 80/4.5 inhaler that you have at home and START using the 160/4.5 inhaler.  We are giving you this new inhaler in the hospital to take home with your.  We are also giving you a prescription to pick up a refill of this inhaler.  Your doctor at the [facility] will need to prescribe refills in the future.  I have prescribed you two steroid pills; please take 1 tomorrow and 1 on Wednesday.     3. If you have worsening of your symptoms or new symptoms arise, please call the clinic ([phone]), or go to the ER immediately if symptoms are severe.

## 2015-05-13 NOTE — Progress Notes (Signed)
Subjective: [name] [name] was seen and examined this AM.  He is feeling better.  Denies dyspnea, chest pain.  His appetite is good.  Had a dry cough last night.  Mucinex normally works for him.  He feels ready to go home.  Objective: Vital signs in last 24 hours: Filed Vitals:   05/12/15 1645 05/12/15 1953 05/12/15 2129 05/13/15 0527  BP:   125/60 125/79  Pulse:   108 112  Temp:   98.5 F (36.9 C) 98.2 F (36.8 C)  TempSrc:   Oral Oral  Resp:   18 20  Height:      Weight:      SpO2: 98% 98% 97% 98%   Weight change:   Intake/Output Summary (Last 24 hours) at 05/13/15 0756 Last data filed at 05/13/15 0700  Gross per 24 hour  Intake    443 ml  Output   1400 ml  Net   -957 ml    Lab Results: Basic Metabolic Panel:  Recent Labs Lab 05/12/15 0530 05/12/15 1111 05/13/15 0346  NA 134*  --  134*  K 3.7  --  3.9  CL 100*  --  99*  CO2 26  --  26  GLUCOSE 110*  --  130*  BUN 12  --  13  CREATININE 0.75  --  0.77  CALCIUM 9.2  --  9.4  MG  --  2.1 2.0   Medications: I have reviewed the patient's current medications. Scheduled Meds: . budesonide-formoterol  2 puff Inhalation BID  . enoxaparin (LOVENOX) injection  40 mg Subcutaneous Q24H  . finasteride  5 mg Oral Daily  . fluticasone  2 spray Each Nare Daily  . lisinopril  10 mg Oral Daily   And  . hydrochlorothiazide  12.5 mg Oral Daily  . ipratropium-albuterol  3 mL Nebulization Q4H  . methylPREDNISolone (SOLU-MEDROL) injection  40 mg Intravenous Q12H  . pantoprazole  40 mg Oral Daily  . sodium chloride  3 mL Intravenous Q12H  . terazosin  5 mg Oral QHS   Continuous Infusions: none PRN Meds:.acetaminophen, guaiFENesin, ipratropium-albuterol   Assessment/Plan: 64 year old male with COPD here with dyspnea.  Dyspnea 2/2 AECOPD:  2D ECHO normal and this seems to be primary pulmonary issue.  Good response to steroid, duoneb.  SpO2 is good on RA and he denies dyspnea when ambulating to bathroom.  He would benefit  from increase Symbicort dose. - will check SpO2 with ambulation - d/c home today and he will arrange folow-up with PCP at [facility] in the next week - s/p solumedrol 40mg  IV this AM, will give rx for prednisone 40mg  tomorrow and the day after to complete five day course - increase Symbicort to 160/4.5 dose at discharge - no indication for antibotic (no increased sputum, afebrile, no leukocytosis, CXR looks good)  - continue nasal steroid   Sinus infection:  Reportedly dx by his doctor last week and started on Augmentin.  He has completed course of abx. - Flonase - Tylenol for headache, can try NSAID or Tramadol if no relief with Tylenol - Mucinex prn  Brief VT on telemetry:  Night of 08/27.  Has not recurred.  2D ECHO reassuring.  electrolytes normal.  Hypertension: Stable. - continue Prinzide - if cough continues with trx of COPD, seasonal allergies and GERD, PCP should should consider stopping ACEI to see if this helps  GERD: Potential contributor to his cough.  - Continue PPI.  BPH: Continue Finasteride and Terazosin.  Diet: Regular VTE ppx: Lovenox Code status: Full  Dispo: Stable for d/c home with close PCP follow-up.  The patient does have a current PCP (at [facility]) and does not need an [facility] hospital follow-up appointment after discharge.  The patient does not know have transportation limitations that hinder transportation to clinic appointments.  .Services Needed at time of discharge: Y = Yes, Blank = No PT:   OT:   RN:   Equipment:   Other:       Francesca Oman, DO 05/13/2015, 7:56 AM

## 2015-05-13 NOTE — Care Management Note (Signed)
Case Management Note  Patient Details  Name: [name] [name] MRN: [id number] Date of Birth: 08-08-1951  Subjective/Objective:     Patient is for dc today, no needs               Action/Plan:   Expected Discharge Date:                  Expected Discharge Plan:  Home/Self Care  In-House Referral:     Discharge planning Services     Post Acute Care Choice:    Choice offered to:     DME Arranged:    DME Agency:     HH Arranged:    [place] Agency:     Status of Service:  Completed, signed off  Medicare Important Message Given:    Date Medicare IM Given:    Medicare IM give by:    Date Additional Medicare IM Given:    Additional Medicare Important Message give by:     If discussed at [place] of Stay Meetings, dates discussed:    Additional Comments:  Zenon Mayo, RN 05/13/2015, 12:05 PM

## 2015-05-13 NOTE — Progress Notes (Signed)
  Date: 05/13/2015  Patient name: [name] [name]  Medical record number: [id number]  Date of birth: [birth year]   This patient has been seen and the plan of care was discussed with the house staff. Please see their note for complete details. I concur with their findings with the following additions/corrections: [name] was admitted for COPD exac. He is currently feeling at baseline. He has good air flow without wheezing, He did not desat with ambulation. He is stable for d/C home.  Bartholomew Crews, MD 05/13/2015, 2:50 PM

## 2015-07-18 ENCOUNTER — Emergency Department (HOSPITAL_COMMUNITY)
Admission: EM | Admit: 2015-07-18 | Discharge: 2015-07-18 | Disposition: A | Discharge status: Home / Self Care | Payer: Non-veteran care | Attending: Emergency Medicine | Admitting: Emergency Medicine

## 2015-07-18 ENCOUNTER — Emergency Department (HOSPITAL_COMMUNITY): Payer: Non-veteran care

## 2015-07-18 ENCOUNTER — Encounter (HOSPITAL_COMMUNITY): Payer: Self-pay | Admitting: Emergency Medicine

## 2015-07-18 DIAGNOSIS — Z9889 Other specified postprocedural states: Secondary | ICD-10-CM | POA: Diagnosis not present

## 2015-07-18 DIAGNOSIS — J441 Chronic obstructive pulmonary disease with (acute) exacerbation: Secondary | ICD-10-CM | POA: Diagnosis not present

## 2015-07-18 DIAGNOSIS — R062 Wheezing: Secondary | ICD-10-CM | POA: Diagnosis present

## 2015-07-18 DIAGNOSIS — I959 Hypotension, unspecified: Secondary | ICD-10-CM | POA: Insufficient documentation

## 2015-07-18 DIAGNOSIS — Z87891 Personal history of nicotine dependence: Secondary | ICD-10-CM | POA: Insufficient documentation

## 2015-07-18 DIAGNOSIS — Z79899 Other long term (current) drug therapy: Secondary | ICD-10-CM | POA: Insufficient documentation

## 2015-07-18 LAB — I-STAT CHEM 8, ED
BUN: 11 mg/dL (ref 6–20)
CHLORIDE: 100 mmol/L — AB (ref 101–111)
Calcium, Ion: 1.17 mmol/L (ref 1.13–1.30)
Creatinine, Ser: 0.8 mg/dL (ref 0.61–1.24)
Glucose, Bld: 114 mg/dL — ABNORMAL HIGH (ref 65–99)
HEMATOCRIT: 40 % (ref 39.0–52.0)
Hemoglobin: 13.6 g/dL (ref 13.0–17.0)
POTASSIUM: 3.1 mmol/L — AB (ref 3.5–5.1)
SODIUM: 138 mmol/L (ref 135–145)
TCO2: 25 mmol/L (ref 0–100)

## 2015-07-18 LAB — CBC
HEMATOCRIT: 37.5 % — AB (ref 39.0–52.0)
HEMOGLOBIN: 12.6 g/dL — AB (ref 13.0–17.0)
MCH: 31 pg (ref 26.0–34.0)
MCHC: 33.6 g/dL (ref 30.0–36.0)
MCV: 92.4 fL (ref 78.0–100.0)
Platelets: 252 10*3/uL (ref 150–400)
RBC: 4.06 MIL/uL — AB (ref 4.22–5.81)
RDW: 13.2 % (ref 11.5–15.5)
WBC: 7.8 10*3/uL (ref 4.0–10.5)

## 2015-07-18 LAB — BRAIN NATRIURETIC PEPTIDE: B NATRIURETIC PEPTIDE 5: 11.1 pg/mL (ref 0.0–100.0)

## 2015-07-18 LAB — BASIC METABOLIC PANEL
Anion gap: 8 (ref 5–15)
BUN: 13 mg/dL (ref 6–20)
CHLORIDE: 104 mmol/L (ref 101–111)
CO2: 27 mmol/L (ref 22–32)
Calcium: 9.3 mg/dL (ref 8.9–10.3)
Creatinine, Ser: 0.75 mg/dL (ref 0.61–1.24)
GFR calc non Af Amer: >60 mL/min (ref >60)
Glucose, Bld: 110 mg/dL — ABNORMAL HIGH (ref 65–99)
POTASSIUM: 3.1 mmol/L — AB (ref 3.5–5.1)
SODIUM: 139 mmol/L (ref 135–145)

## 2015-07-18 LAB — I-STAT TROPONIN, ED: Troponin i, poc: 0 ng/mL (ref 0.00–0.08)

## 2015-07-18 MED ORDER — POTASSIUM CHLORIDE CRYS ER 20 MEQ PO TBCR
40.0000 meq | EXTENDED_RELEASE_TABLET | Freq: Once | ORAL | Status: AC
  Administered 2015-07-18: 40 meq via ORAL
  Filled 2015-07-18: qty 2

## 2015-07-18 MED ORDER — ALBUTEROL SULFATE (2.5 MG/3ML) 0.083% IN NEBU
5.0000 mg | INHALATION_SOLUTION | Freq: Once | RESPIRATORY_TRACT | Status: AC
  Administered 2015-07-18: 5 mg via RESPIRATORY_TRACT
  Filled 2015-07-18: qty 6

## 2015-07-18 MED ORDER — ALBUTEROL SULFATE HFA 108 (90 BASE) MCG/ACT IN AERS: 1.0000 | INHALATION_SPRAY | Freq: Four times a day (QID) | RESPIRATORY_TRACT | Status: DC | PRN

## 2015-07-18 MED ORDER — DOXYCYCLINE HYCLATE 100 MG PO CAPS: 100.0000 mg | ORAL_CAPSULE | Freq: Two times a day (BID) | ORAL | Status: DC

## 2015-07-18 MED ORDER — PREDNISONE 20 MG PO TABS: ORAL_TABLET | ORAL | Status: DC

## 2015-07-18 NOTE — ED Notes (Addendum)
Pt placed on 2L of oxygen for saturation of 88% while pt is sleeping.

## 2015-07-18 NOTE — ED Notes (Signed)
EKG given to EDP,Palumbo,MD., for review. 

## 2015-07-18 NOTE — ED Provider Notes (Signed)
CSN: [id number]     Arrival date & time 07/18/15  0403 History   First MD Initiated Contact with Patient 07/18/15 0404     Chief Complaint  Patient presents with  . COPD     (Consider location/radiation/quality/duration/timing/severity/associated sxs/prior Treatment) Patient is a 64 y.o. male presenting with wheezing. The history is provided by the patient.  Wheezing Severity:  Moderate Severity compared to prior episodes:  Similar Onset quality:  Gradual Timing:  Constant Progression:  Worsening Chronicity:  Recurrent Context: not emotional upset   Relieved by:  Nothing Worsened by:  Nothing tried Ineffective treatments:  Nebulizer treatments Associated symptoms: no chest pain, no fever and no stridor   Risk factors: not exposed to toxic fumes     Past Medical History  Diagnosis Date  . HTN (hypertension)   . Bradycardia, sinus 12/24/11  . Syncope and collapse 12/24/11    2/2 hypotension in the setting of dehydration  . Hypotension 12/24/11    in the setting of dehydration   . Abnormal EKG 12/24/11    anteroseptal and lateral ST elevation, felt r/t early repolarization;  Cardiac cath 12/24/11 - normal coronary anatomy, EF 55-65%  . Marijuana use   . History of alcohol abuse     hospitalized for detox 2002  . COPD (chronic obstructive pulmonary disease) [facility])    Past Surgical History  Procedure Laterality Date  . Circumcision  1972  . Laceration repair  02/2004    arthroscopic debridement of triagular fibrocartilage tear/E-chart; right wrist  . Cardiac catheterization  12/24/11    normal coronary anatomy, EF 55-65%  . Left heart catheterization with coronary angiogram N/A 12/24/2011    Procedure: LEFT HEART CATHETERIZATION WITH CORONARY ANGIOGRAM;  Surgeon: Peter M Martinique, MD;  Location: [facility] CATH LAB;  Service: Cardiovascular;  Laterality: N/A;   History reviewed. No pertinent family history. Social History  Substance Use Topics  . Smoking status: Former Research scientist (life sciences)  .  Smokeless tobacco: Never Used  . Alcohol Use: Yes     Comment: soc    Review of Systems  Constitutional: Negative for fever.  Respiratory: Positive for wheezing. Negative for stridor.   Cardiovascular: Negative for chest pain.  All other systems reviewed and are negative.     Allergies  Review of patient's allergies indicates no known allergies.  Home Medications   Prior to Admission medications   Medication Sig Start Date End Date Taking? Authorizing Provider  albuterol (PROVENTIL HFA;VENTOLIN HFA) 108 (90 BASE) MCG/ACT inhaler Inhale 1-2 puffs into the lungs every 6 (six) hours as needed for wheezing or shortness of breath. 04/03/15  Yes Donne Hazel, MD  albuterol (PROVENTIL) (2.5 MG/3ML) 0.083% nebulizer solution Take 3-6 mLs (2.5-5 mg total) by nebulization every 4 (four) hours as needed for wheezing or shortness of breath. 03/12/15  Yes Shanon Rosser, MD  budesonide-formoterol (SYMBICORT) 160-4.5 MCG/ACT inhaler Inhale 2 puffs into the lungs 2 (two) times daily. 05/13/15  Yes Francesca Oman, DO  finasteride (PROSCAR) 5 MG tablet Take 5 mg by mouth daily.   Yes Historical Provider, MD  flunisolide (NASALIDE) 25 MCG/ACT (0.025%) SOLN Place 2 sprays into the nose 2 (two) times daily as needed (nasal congestion).   Yes Historical Provider, MD  guaiFENesin (MUCINEX) 600 MG 12 hr tablet Take by mouth 2 (two) times daily as needed for cough or to loosen phlegm.   Yes Historical Provider, MD  lisinopril-hydrochlorothiazide (PRINZIDE,ZESTORETIC) 10-12.5 MG per tablet Take 1 tablet by mouth daily.   Yes Historical  Provider, MD  omeprazole (PRILOSEC) 20 MG capsule Take 20 mg by mouth 2 (two) times daily as needed (for acid reflux).    Yes Historical Provider, MD  terazosin (HYTRIN) 5 MG capsule Take 5 mg by mouth at bedtime.   Yes Historical Provider, MD  benzonatate (TESSALON) 200 MG capsule Take 1 capsule (200 mg total) by mouth 3 (three) times daily as needed for cough. Patient not taking:  Reported on 05/11/2015 03/29/15   Everlene Balls, MD  Respiratory Therapy Supplies (NEBULIZER) DEVI 1 Device by Does not apply route as needed. Patient not taking: Reported on 04/05/2015 04/03/15   Donne Hazel, MD   BP 134/80 mmHg  Pulse 100  Temp(Src) 98.4 F (36.9 C) (Oral)  Resp 22  SpO2 94% Physical Exam  Constitutional: He is oriented to person, place, and time. He appears well-developed and well-nourished. No distress.  HENT:  Head: Normocephalic and atraumatic.  Mouth/Throat: Oropharynx is clear and moist.  Eyes: Conjunctivae are normal. Pupils are equal, round, and reactive to light.  Neck: Normal range of motion. Neck supple.  Cardiovascular: Normal rate, regular rhythm and intact distal pulses.   Pulmonary/Chest: No respiratory distress. He has wheezes. He has no rales.  Abdominal: Soft. Bowel sounds are normal. There is no tenderness. There is no rebound and no guarding.  Musculoskeletal: Normal range of motion.  Neurological: He is alert and oriented to person, place, and time.  Skin: Skin is warm and dry.  Psychiatric: He has a normal mood and affect.    ED Course  Procedures (including critical care time) Labs Review Labs Reviewed  BASIC METABOLIC PANEL  CBC  BRAIN NATRIURETIC PEPTIDE  I-STAT CHEM 8, ED  I-STAT TROPOININ, ED    Imaging Review No results found. I have personally reviewed and evaluated these images and lab results as part of my medical decision-making.   EKG Interpretation   Date/Time:  Thursday July 18 2015 04:11:51 EDT Ventricular Rate:  89 PR Interval:  170 QRS Duration: 88 QT Interval:  375 QTC Calculation: 456 R Axis:   68 Text Interpretation:  Sinus rhythm Confirmed by [facility]  MD, Gaius Ishaq  ([id number]) on 07/18/2015 4:16:51 AM      MDM   Final diagnoses:  None    Results for orders placed or performed during the hospital encounter of 65/03/54  Basic metabolic panel  Result Value Ref Range   Sodium 139 135 - 145 mmol/L    Potassium 3.1 (L) 3.5 - 5.1 mmol/L   Chloride 104 101 - 111 mmol/L   CO2 27 22 - 32 mmol/L   Glucose, Bld 110 (H) 65 - 99 mg/dL   BUN 13 6 - 20 mg/dL   Creatinine, Ser 0.75 0.61 - 1.24 mg/dL   Calcium 9.3 8.9 - 10.3 mg/dL   GFR calc non Af Amer >60 >60 mL/min   GFR calc Af Amer >60 >60 mL/min   Anion gap 8 5 - 15  CBC  Result Value Ref Range   WBC 7.8 4.0 - 10.5 K/uL   RBC 4.06 (L) 4.22 - 5.81 MIL/uL   Hemoglobin 12.6 (L) 13.0 - 17.0 g/dL   HCT 37.5 (L) 39.0 - 52.0 %   MCV 92.4 78.0 - 100.0 fL   MCH 31.0 26.0 - 34.0 pg   MCHC 33.6 30.0 - 36.0 g/dL   RDW 13.2 11.5 - 15.5 %   Platelets 252 150 - 400 K/uL  Brain natriuretic peptide  Result Value Ref Range   B  Natriuretic Peptide 11.1 0.0 - 100.0 pg/mL  I-Stat Chem 8, ED  Result Value Ref Range   Sodium 138 135 - 145 mmol/L   Potassium 3.1 (L) 3.5 - 5.1 mmol/L   Chloride 100 (L) 101 - 111 mmol/L   BUN 11 6 - 20 mg/dL   Creatinine, Ser 0.80 0.61 - 1.24 mg/dL   Glucose, Bld 114 (H) 65 - 99 mg/dL   Calcium, Ion 1.17 1.13 - 1.30 mmol/L   TCO2 25 0 - 100 mmol/L   Hemoglobin 13.6 13.0 - 17.0 g/dL   HCT 40.0 39.0 - 52.0 %  I-Stat Troponin, ED (not at [facility])  Result Value Ref Range   Troponin i, poc 0.00 0.00 - 0.08 ng/mL   Comment 3           Dg Chest 2 View  07/18/2015  CLINICAL DATA:  Dyspnea all week, worsened this morning. EXAM: CHEST  2 VIEW COMPARISON:  05/12/2015 FINDINGS: Nipple shadow again noted on the left. The lungs are clear. There is no pleural effusion. Hilar and mediastinal contours are unremarkable. Heart size is normal. Pulmonary vasculature is normal. IMPRESSION: No active cardiopulmonary disease. Electronically Signed   By: Andreas Newport M.D.   On: 07/18/2015 04:58    Medications  albuterol (PROVENTIL) (2.5 MG/3ML) 0.083% nebulizer solution 5 mg (5 mg Nebulization Given 07/18/15 0438)  potassium chloride SA (K-DUR,KLOR-CON) CR tablet 40 mEq (40 mEq Oral Given 07/18/15 0540)   Feels markedly improved safe  for d/c at this time.  Strict return precautions    Sumedha Munnerlyn, MD 07/18/15 0600

## 2015-07-18 NOTE — ED Notes (Signed)
Patient returned from XR. 

## 2015-07-18 NOTE — Discharge Instructions (Signed)
Asthma, Adult Asthma is a condition of the lungs in which the airways tighten and narrow. Asthma can make it hard to breathe. Asthma cannot be cured, but medicine and lifestyle changes can help control it. Asthma may be started (triggered) by:  Animal skin flakes (dander).  Dust.  Cockroaches.  Pollen.  Mold.  Smoke.  Cleaning products.  Hair sprays or aerosol sprays.  Paint fumes or strong smells.  Cold air, weather changes, and winds.  Crying or laughing hard.  Stress.  Certain medicines or drugs.  Foods, such as dried fruit, potato chips, and sparkling grape juice.  Infections or conditions (colds, flu).  Exercise.  Certain medical conditions or diseases.  Exercise or tiring activities. HOME CARE   Take medicine as told by your doctor.  Use a peak flow meter as told by your doctor. A peak flow meter is a tool that measures how well the lungs are working.  Record and keep track of the peak flow meter's readings.  Understand and use the asthma action plan. An asthma action plan is a written plan for taking care of your asthma and treating your attacks.  To help prevent asthma attacks:  Do not smoke. Stay away from secondhand smoke.  Change your heating and air conditioning filter often.  Limit your use of fireplaces and wood stoves.  Get rid of pests (such as roaches and mice) and their droppings.  Throw away plants if you see mold on them.  Clean your floors. Dust regularly. Use cleaning products that do not smell.  Have someone vacuum when you are not home. Use a vacuum cleaner with a HEPA filter if possible.  Replace carpet with wood, tile, or vinyl flooring. Carpet can trap animal skin flakes and dust.  Use allergy-proof pillows, mattress covers, and box spring covers.  Wash bed sheets and blankets every week in hot water and dry them in a dryer.  Use blankets that are made of polyester or cotton.  Clean bathrooms and kitchens with bleach.  If possible, have someone repaint the walls in these rooms with mold-resistant paint. Keep out of the rooms that are being cleaned and painted.  Wash hands often. GET HELP IF:  You have make a whistling sound when breaking (wheeze), have shortness of breath, or have a cough even if taking medicine to prevent attacks.  The colored mucus you cough up (sputum) is thicker than usual.  The colored mucus you cough up changes from clear or white to yellow, green, gray, or bloody.  You have problems from the medicine you are taking such as:  A rash.  Itching.  Swelling.  Trouble breathing.  You need reliever medicines more than 2-3 times a week.  Your peak flow measurement is still at 50-79% of your personal best after following the action plan for 1 hour.  You have a fever. GET HELP RIGHT AWAY IF:   You seem to be worse and are not responding to medicine during an asthma attack.  You are short of breath even at rest.  You get short of breath when doing very little activity.  You have trouble eating, drinking, or talking.  You have chest pain.  You have a fast heartbeat.  Your lips or fingernails start to turn blue.  You are light-headed, dizzy, or faint.  Your peak flow is less than 50% of your personal best.   This information is not intended to replace advice given to you by your health care provider. Make sure   you discuss any questions you have with your health care provider.   Document Released: 02/17/2008 Document Revised: 05/22/2015 Document Reviewed: 03/30/2013 Elsevier Interactive Patient Education 2016 [employer]  

## 2015-07-18 NOTE — ED Notes (Signed)
Per EMS, patient from home with complaint of COPD exacerbation. Patient ambulatory with EMS, states his breathing has slowly been worsening throughout the week. Patient resp was labored initially, with insp/exp wheezes throughout. Patient was given 5mg  albuterol neb, 0.5mg  atrovent neb, and 125mg  solumedrol IV with EMS en route.

## 2015-07-18 NOTE — ED Notes (Signed)
RT requested for neb treatment 

## 2015-07-18 NOTE — ED Notes (Signed)
Pt woke up and oxygen was turned off. Pt was able to maintain saturation above 90%

## 2015-07-18 NOTE — ED Notes (Signed)
Bed: BT51 Expected date:  Expected time:  Means of arrival:  Comments: COPD exacerbation

## 2015-07-18 NOTE — ED Notes (Signed)
Patient transported to X-ray 

## 2015-08-04 ENCOUNTER — Emergency Department (HOSPITAL_COMMUNITY)
Admission: EM | Admit: 2015-08-04 | Discharge: 2015-08-04 | Disposition: A | Discharge status: Home / Self Care | Payer: Non-veteran care | Attending: Emergency Medicine | Admitting: Emergency Medicine

## 2015-08-04 ENCOUNTER — Emergency Department (HOSPITAL_COMMUNITY): Payer: Non-veteran care

## 2015-08-04 ENCOUNTER — Encounter (HOSPITAL_COMMUNITY): Payer: Self-pay | Admitting: Emergency Medicine

## 2015-08-04 DIAGNOSIS — J441 Chronic obstructive pulmonary disease with (acute) exacerbation: Secondary | ICD-10-CM | POA: Insufficient documentation

## 2015-08-04 DIAGNOSIS — R000 Tachycardia, unspecified: Secondary | ICD-10-CM | POA: Diagnosis not present

## 2015-08-04 DIAGNOSIS — I10 Essential (primary) hypertension: Secondary | ICD-10-CM | POA: Insufficient documentation

## 2015-08-04 DIAGNOSIS — Z87891 Personal history of nicotine dependence: Secondary | ICD-10-CM | POA: Diagnosis not present

## 2015-08-04 DIAGNOSIS — Z7951 Long term (current) use of inhaled steroids: Secondary | ICD-10-CM | POA: Diagnosis not present

## 2015-08-04 DIAGNOSIS — Z79899 Other long term (current) drug therapy: Secondary | ICD-10-CM | POA: Diagnosis not present

## 2015-08-04 DIAGNOSIS — R0602 Shortness of breath: Secondary | ICD-10-CM | POA: Diagnosis present

## 2015-08-04 LAB — BASIC METABOLIC PANEL
Anion gap: 9 (ref 5–15)
BUN: 14 mg/dL (ref 6–20)
CO2: 24 mmol/L (ref 22–32)
CREATININE: 0.96 mg/dL (ref 0.61–1.24)
Calcium: 9.2 mg/dL (ref 8.9–10.3)
Chloride: 107 mmol/L (ref 101–111)
GFR calc Af Amer: >60 mL/min (ref >60)
GLUCOSE: 97 mg/dL (ref 65–99)
POTASSIUM: 3.5 mmol/L (ref 3.5–5.1)
Sodium: 140 mmol/L (ref 135–145)

## 2015-08-04 LAB — CBC
HEMATOCRIT: 39.4 % (ref 39.0–52.0)
Hemoglobin: 13.4 g/dL (ref 13.0–17.0)
MCH: 31.4 pg (ref 26.0–34.0)
MCHC: 34 g/dL (ref 30.0–36.0)
MCV: 92.3 fL (ref 78.0–100.0)
PLATELETS: 277 10*3/uL (ref 150–400)
RBC: 4.27 MIL/uL (ref 4.22–5.81)
RDW: 13.6 % (ref 11.5–15.5)
WBC: 5.9 10*3/uL (ref 4.0–10.5)

## 2015-08-04 MED ORDER — IPRATROPIUM BROMIDE 0.02 % IN SOLN
0.5000 mg | Freq: Once | RESPIRATORY_TRACT | Status: AC
  Administered 2015-08-04: 0.5 mg via RESPIRATORY_TRACT
  Filled 2015-08-04: qty 2.5

## 2015-08-04 MED ORDER — METHYLPREDNISOLONE SODIUM SUCC 125 MG IJ SOLR
125.0000 mg | Freq: Once | INTRAMUSCULAR | Status: AC
  Administered 2015-08-04: 125 mg via INTRAVENOUS
  Filled 2015-08-04: qty 2

## 2015-08-04 MED ORDER — PREDNISONE 20 MG PO TABS: 40.0000 mg | ORAL_TABLET | Freq: Every day | ORAL | Status: DC

## 2015-08-04 MED ORDER — ALBUTEROL SULFATE (2.5 MG/3ML) 0.083% IN NEBU
5.0000 mg | INHALATION_SOLUTION | Freq: Once | RESPIRATORY_TRACT | Status: AC
  Administered 2015-08-04: 5 mg via RESPIRATORY_TRACT
  Filled 2015-08-04: qty 6

## 2015-08-04 NOTE — ED Notes (Signed)
Pt has COPD and c/o worsening SHOB since last night. Pt states that he has taken inhaler and neb without any relief.  Pt states that had productive cough x1. Pt having labored breathing esp with exertion.

## 2015-08-04 NOTE — ED Provider Notes (Signed)
CSN: [id number]     Arrival date & time 08/04/15  1558 History   First MD Initiated Contact with Patient 08/04/15 1617     Chief Complaint  Patient presents with  . Shortness of Breath     (Consider location/radiation/quality/duration/timing/severity/associated sxs/prior Treatment) Patient is a 64 y.o. male presenting with shortness of breath. The history is provided by the patient.  Shortness of Breath Severity:  Moderate Associated symptoms: cough   Associated symptoms: no abdominal pain, no chest pain and no fever    patient presents with shortness of breath. He has had it for the last few days. He's had a cough with sputum production once. States his nose is been running. No fevers. States no relief with his nebulizer at home. Has history of COPD. Last exacerbation was about 3 weeks ago. No abdominal pain. No swelling was like contacts.  Past Medical History  Diagnosis Date  . HTN (hypertension)   . Bradycardia, sinus 12/24/11  . Syncope and collapse 12/24/11    2/2 hypotension in the setting of dehydration  . Hypotension 12/24/11    in the setting of dehydration   . Abnormal EKG 12/24/11    anteroseptal and lateral ST elevation, felt r/t early repolarization;  Cardiac cath 12/24/11 - normal coronary anatomy, EF 55-65%  . Marijuana use   . History of alcohol abuse     hospitalized for detox 2002  . COPD (chronic obstructive pulmonary disease) [facility])    Past Surgical History  Procedure Laterality Date  . Circumcision  1972  . Laceration repair  02/2004    arthroscopic debridement of triagular fibrocartilage tear/E-chart; right wrist  . Cardiac catheterization  12/24/11    normal coronary anatomy, EF 55-65%  . Left heart catheterization with coronary angiogram N/A 12/24/2011    Procedure: LEFT HEART CATHETERIZATION WITH CORONARY ANGIOGRAM;  Surgeon: Peter M Martinique, MD;  Location: [facility] CATH LAB;  Service: Cardiovascular;  Laterality: N/A;   No family history on file. Social History   Substance Use Topics  . Smoking status: Former Research scientist (life sciences)  . Smokeless tobacco: Never Used  . Alcohol Use: Yes     Comment: soc    Review of Systems  Constitutional: Negative for fever and appetite change.  HENT: Positive for congestion and postnasal drip.   Respiratory: Positive for cough and shortness of breath.   Cardiovascular: Negative for chest pain.  Gastrointestinal: Negative for abdominal pain.  Genitourinary: Negative for flank pain.  Musculoskeletal: Negative for back pain.  Skin: Negative for wound.  Neurological: Negative for light-headedness.  Psychiatric/Behavioral: Negative for behavioral problems.      Allergies  Review of patient's allergies indicates no known allergies.  Home Medications   Prior to Admission medications   Medication Sig Start Date End Date Taking? Authorizing Provider  albuterol (PROVENTIL HFA;VENTOLIN HFA) 108 (90 BASE) MCG/ACT inhaler Inhale 1-2 puffs into the lungs every 6 (six) hours as needed for wheezing or shortness of breath. 04/03/15  Yes Donne Hazel, MD  albuterol (PROVENTIL HFA;VENTOLIN HFA) 108 (90 BASE) MCG/ACT inhaler Inhale 1-2 puffs into the lungs every 6 (six) hours as needed for wheezing or shortness of breath. 07/18/15  Yes April Palumbo, MD  albuterol (PROVENTIL) (2.5 MG/3ML) 0.083% nebulizer solution Take 3-6 mLs (2.5-5 mg total) by nebulization every 4 (four) hours as needed for wheezing or shortness of breath. 03/12/15  Yes Shanon Rosser, MD  budesonide-formoterol (SYMBICORT) 160-4.5 MCG/ACT inhaler Inhale 2 puffs into the lungs 2 (two) times daily. 05/13/15  Yes Alex  Ronnie Derby, DO  DM-Phenylephrine-Acetaminophen (VICKS DAYQUIL COLD & FLU) 10-5-325 MG/15ML LIQD Take 30 mLs by mouth daily as needed (for cold).   Yes Historical Provider, MD  finasteride (PROSCAR) 5 MG tablet Take 5 mg by mouth daily.   Yes Historical Provider, MD  flunisolide (NASALIDE) 25 MCG/ACT (0.025%) SOLN Place 2 sprays into the nose 2 (two) times daily as  needed (nasal congestion).   Yes Historical Provider, MD  guaiFENesin (MUCINEX) 600 MG 12 hr tablet Take by mouth 2 (two) times daily as needed for cough or to loosen phlegm.   Yes Historical Provider, MD  lisinopril-hydrochlorothiazide (PRINZIDE,ZESTORETIC) 10-12.5 MG per tablet Take 1 tablet by mouth daily.   Yes Historical Provider, MD  omeprazole (PRILOSEC) 20 MG capsule Take 20 mg by mouth 2 (two) times daily as needed (for acid reflux).    Yes Historical Provider, MD  Phenyleph-Doxylamine-DM-APAP (NYQUIL SEVERE COLD/FLU) 5-6.25-10-325 MG/15ML LIQD Take 30 mLs by mouth at bedtime as needed (for cold/sleep).   Yes Historical Provider, MD  Respiratory Therapy Supplies (NEBULIZER) DEVI 1 Device by Does not apply route as needed. 04/03/15  Yes Donne Hazel, MD  terazosin (HYTRIN) 5 MG capsule Take 5 mg by mouth at bedtime.   Yes Historical Provider, MD  benzonatate (TESSALON) 200 MG capsule Take 1 capsule (200 mg total) by mouth 3 (three) times daily as needed for cough. Patient not taking: Reported on 05/11/2015 03/29/15   Everlene Balls, MD  doxycycline (VIBRAMYCIN) 100 MG capsule Take 1 capsule (100 mg total) by mouth 2 (two) times daily. One po bid x 7 days Patient not taking: Reported on 08/04/2015 07/18/15   April Palumbo, MD  predniSONE (DELTASONE) 20 MG tablet Take 2 tablets (40 mg total) by mouth daily. 08/04/15   Davonna Belling, MD   BP 151/92 mmHg  Pulse 90  Temp(Src) 98.3 F (36.8 C) (Oral)  Resp 26  SpO2 94% Physical Exam  Constitutional: He appears well-developed.  HENT:  Head: Atraumatic.  Neck: Neck supple.  Cardiovascular:  Tachycardia  Pulmonary/Chest:  Some respiratory distress with diffuse harsh breath sounds. Diffuse wheezes.  Abdominal: Soft.  Musculoskeletal: Normal range of motion.  Neurological: He is alert.  Skin: Skin is warm.    ED Course  Procedures (including critical care time) Labs Review Labs Reviewed  BASIC METABOLIC PANEL  CBC    Imaging  Review Dg Chest Portable 1 View  08/04/2015  CLINICAL DATA:  Shortness of breath. Questionable nipple shadow in the right lower lung on chest radiograph from earlier today. EXAM: PORTABLE CHEST 1 VIEW COMPARISON:  Chest radiograph from earlier today. FINDINGS: Nipple markers were placed on the skin bilaterally by the technologist. Stable cardiomediastinal silhouette with normal heart size. No pneumothorax. No pleural effusion. Clear lungs, with no focal lung consolidation and no pulmonary edema. The nodular opacity described in the right lower lung on the chest radiograph from earlier today correlates with the right nipple. IMPRESSION: No active disease. Right nipple shadow confirmed with nipple marker. Electronically Signed   By: Ilona Sorrel M.D.   On: 08/04/2015 17:37   Dg Chest Portable 1 View  08/04/2015  CLINICAL DATA:  PT C/O SEVERE SUDDEN ONSET SOB, FEVER, COUGH, CONGESTION X 2 DAYS, HX COPD EXAM: PORTABLE CHEST 1 VIEW COMPARISON:  07/18/2015 FINDINGS: Numerous leads and wires project over the chest. Midline trachea. Normal heart size. No pleural effusion or pneumothorax. Nodular density projecting over the right lung base could represent a nipple shadow and/or osseous summation shadow. No  nodule in this area on CT of 03/18/2015. IMPRESSION: No acute cardiopulmonary disease. Possible right sided nipple shadow. Repeat frontal radiograph with nipple markers could confirm. Electronically Signed   By: Abigail Miyamoto M.D.   On: 08/04/2015 16:52   I have personally reviewed and evaluated these images and lab results as part of my medical decision-making.   EKG Interpretation   Date/Time:  Sunday August 04 2015 16:20:37 EST Ventricular Rate:  100 PR Interval:  164 QRS Duration: 86 QT Interval:  337 QTC Calculation: 435 R Axis:   77 Text Interpretation:  Sinus tachycardia Biatrial enlargement Anterior  infarct, old Confirmed by Hriday Stai  MD, [place] [id number]) on 08/04/2015  4:35:01 PM       MDM   Final diagnoses:  COPD with acute exacerbation ([place])    Patient was shortness of breath. Likely COPD exacerbation. Feels much better after treatment. Will discharge home with sterile it's and patient will take the antibiotics that he had been given previously.    Davonna Belling, MD 08/04/15 [phone]

## 2015-08-04 NOTE — Discharge Instructions (Signed)
Chronic Obstructive Pulmonary Disease Chronic obstructive pulmonary disease (COPD) is a common lung condition in which airflow from the lungs is limited. COPD is a general term that can be used to describe many different lung problems that limit airflow, including both chronic bronchitis and emphysema. If you have COPD, your lung function will probably never return to normal, but there are measures you can take to improve lung function and make yourself feel better. CAUSES   Smoking (common).  Exposure to secondhand smoke.  Genetic problems.  Chronic inflammatory lung diseases or recurrent infections. SYMPTOMS  Shortness of breath, especially with physical activity.  Deep, persistent (chronic) cough with a large amount of thick mucus.  Wheezing.  Rapid breaths (tachypnea).  Gray or bluish discoloration (cyanosis) of the skin, especially in your fingers, toes, or lips.  Fatigue.  Weight loss.  Frequent infections or episodes when breathing symptoms become much worse (exacerbations).  Chest tightness. DIAGNOSIS Your health care provider will take a medical history and perform a physical examination to diagnose COPD. Additional tests for COPD may include:  Lung (pulmonary) function tests.  Chest X-ray.  CT scan.  Blood tests. TREATMENT  Treatment for COPD may include:  Inhaler and nebulizer medicines. These help manage the symptoms of COPD and make your breathing more comfortable.  Supplemental oxygen. Supplemental oxygen is only helpful if you have a low oxygen level in your blood.  Exercise and physical activity. These are beneficial for nearly all people with COPD.  Lung surgery or transplant.  Nutrition therapy to gain weight, if you are underweight.  Pulmonary rehabilitation. This may involve working with a team of health care providers and specialists, such as respiratory, occupational, and physical therapists. HOME CARE INSTRUCTIONS  Take all medicines  (inhaled or pills) as directed by your health care provider.  Avoid over-the-counter medicines or cough syrups that dry up your airway (such as antihistamines) and slow down the elimination of secretions unless instructed otherwise by your health care provider.  If you are a smoker, the most important thing that you can do is stop smoking. Continuing to smoke will cause further lung damage and breathing trouble. Ask your health care provider for help with quitting smoking. He or she can direct you to community resources or hospitals that provide support.  Avoid exposure to irritants such as smoke, chemicals, and fumes that aggravate your breathing.  Use oxygen therapy and pulmonary rehabilitation if directed by your health care provider. If you require home oxygen therapy, ask your health care provider whether you should purchase a pulse oximeter to measure your oxygen level at home.  Avoid contact with individuals who have a contagious illness.  Avoid extreme temperature and humidity changes.  Eat healthy foods. Eating smaller, more frequent meals and resting before meals may help you maintain your strength.  Stay active, but balance activity with periods of rest. Exercise and physical activity will help you maintain your ability to do things you want to do.  Preventing infection and hospitalization is very important when you have COPD. Make sure to receive all the vaccines your health care provider recommends, especially the pneumococcal and influenza vaccines. Ask your health care provider whether you need a pneumonia vaccine.  Learn and use relaxation techniques to manage stress.  Learn and use controlled breathing techniques as directed by your health care provider. Controlled breathing techniques include:  Pursed lip breathing. Start by breathing in (inhaling) through your nose for 1 second. Then, purse your lips as if you were   going to whistle and breathe out (exhale) through the  pursed lips for 2 seconds.  Diaphragmatic breathing. Start by putting one hand on your abdomen just above your waist. Inhale slowly through your nose. The hand on your abdomen should move out. Then purse your lips and exhale slowly. You should be able to feel the hand on your abdomen moving in as you exhale.  Learn and use controlled coughing to clear mucus from your lungs. Controlled coughing is a series of short, progressive coughs. The steps of controlled coughing are: 1. Lean your head slightly forward. 2. Breathe in deeply using diaphragmatic breathing. 3. Try to hold your breath for 3 seconds. 4. Keep your mouth slightly open while coughing twice. 5. Spit any mucus out into a tissue. 6. Rest and repeat the steps once or twice as needed. SEEK MEDICAL CARE IF:  You are coughing up more mucus than usual.  There is a change in the color or thickness of your mucus.  Your breathing is more labored than usual.  Your breathing is faster than usual. SEEK IMMEDIATE MEDICAL CARE IF:  You have shortness of breath while you are resting.  You have shortness of breath that prevents you from:  Being able to talk.  Performing your usual physical activities.  You have chest pain lasting longer than 5 minutes.  Your skin color is more cyanotic than usual.  You measure low oxygen saturations for longer than 5 minutes with a pulse oximeter. MAKE SURE YOU:  Understand these instructions.  Will watch your condition.  Will get help right away if you are not doing well or get worse.   This information is not intended to replace advice given to you by your health care provider. Make sure you discuss any questions you have with your health care provider.   Document Released: 06/10/2005 Document Revised: 09/21/2014 Document Reviewed: 04/27/2013 Elsevier Interactive Patient Education 2016 Elsevier Inc[employer]  

## 2015-08-04 NOTE — ED Notes (Signed)
He tells me he is feeling "much better".  He is now slightly short of breath whereas before the h.h.n. Treatment he was markedly short of breath.  Current resp. Rate: 21.  His skin is normal, warm and dry.

## 2015-08-13 ENCOUNTER — Emergency Department (HOSPITAL_COMMUNITY)
Admission: EM | Admit: 2015-08-13 | Discharge: 2015-08-13 | Disposition: A | Discharge status: Home / Self Care | Payer: Non-veteran care | Attending: Emergency Medicine | Admitting: Emergency Medicine

## 2015-08-13 ENCOUNTER — Encounter (HOSPITAL_COMMUNITY): Payer: Self-pay | Admitting: Emergency Medicine

## 2015-08-13 ENCOUNTER — Emergency Department (HOSPITAL_COMMUNITY): Payer: Non-veteran care

## 2015-08-13 DIAGNOSIS — R0602 Shortness of breath: Secondary | ICD-10-CM | POA: Diagnosis present

## 2015-08-13 DIAGNOSIS — Z79899 Other long term (current) drug therapy: Secondary | ICD-10-CM | POA: Diagnosis not present

## 2015-08-13 DIAGNOSIS — Z9889 Other specified postprocedural states: Secondary | ICD-10-CM | POA: Diagnosis not present

## 2015-08-13 DIAGNOSIS — I10 Essential (primary) hypertension: Secondary | ICD-10-CM | POA: Diagnosis not present

## 2015-08-13 DIAGNOSIS — Z87891 Personal history of nicotine dependence: Secondary | ICD-10-CM | POA: Insufficient documentation

## 2015-08-13 DIAGNOSIS — J441 Chronic obstructive pulmonary disease with (acute) exacerbation: Secondary | ICD-10-CM | POA: Diagnosis not present

## 2015-08-13 DIAGNOSIS — J209 Acute bronchitis, unspecified: Secondary | ICD-10-CM

## 2015-08-13 DIAGNOSIS — Z7951 Long term (current) use of inhaled steroids: Secondary | ICD-10-CM | POA: Diagnosis not present

## 2015-08-13 MED ORDER — ALBUTEROL (5 MG/ML) CONTINUOUS INHALATION SOLN
10.0000 mg/h | INHALATION_SOLUTION | Freq: Once | RESPIRATORY_TRACT | Status: AC
  Administered 2015-08-13: 10 mg/h via RESPIRATORY_TRACT
  Filled 2015-08-13: qty 20

## 2015-08-13 MED ORDER — ALBUTEROL SULFATE (2.5 MG/3ML) 0.083% IN NEBU
5.0000 mg | INHALATION_SOLUTION | Freq: Once | RESPIRATORY_TRACT | Status: AC
  Administered 2015-08-13: 5 mg via RESPIRATORY_TRACT

## 2015-08-13 MED ORDER — PREDNISONE 20 MG PO TABS
60.0000 mg | ORAL_TABLET | Freq: Once | ORAL | Status: AC
Start: 2015-08-13 — End: 2015-08-13
  Administered 2015-08-13: 60 mg via ORAL
  Filled 2015-08-13: qty 3

## 2015-08-13 MED ORDER — ALBUTEROL SULFATE (2.5 MG/3ML) 0.083% IN NEBU
INHALATION_SOLUTION | RESPIRATORY_TRACT | Status: AC
  Filled 2015-08-13: qty 6

## 2015-08-13 MED ORDER — PREDNISONE 20 MG PO TABS: 20.0000 mg | ORAL_TABLET | Freq: Two times a day (BID) | ORAL | Status: DC

## 2015-08-13 NOTE — ED Provider Notes (Signed)
CSN: [id number]     Arrival date & time 08/13/15  1344 History   First MD Initiated Contact with Patient 08/13/15 1747     Chief Complaint  Patient presents with  . Shortness of Breath     (Consider location/radiation/quality/duration/timing/severity/associated sxs/prior Treatment) HPI   [name] [name] is a 64 y.o. malePresents for evaluation of cough with trouble breathing. Symptoms are worse at night. He has a dry cough. He denies fever, chills, nausea, vomiting, weakness or dizziness. He is using his home nebulizer therapy, with albuterol, without relief. He does not smoke cigarettes. Has not seen his primary care doctor recently. He has no cardiac history. There are no other known modifying factors.   Past Medical History  Diagnosis Date  . HTN (hypertension)   . Bradycardia, sinus 12/24/11  . Syncope and collapse 12/24/11    2/2 hypotension in the setting of dehydration  . Hypotension 12/24/11    in the setting of dehydration   . Abnormal EKG 12/24/11    anteroseptal and lateral ST elevation, felt r/t early repolarization;  Cardiac cath 12/24/11 - normal coronary anatomy, EF 55-65%  . Marijuana use   . History of alcohol abuse     hospitalized for detox 2002  . COPD (chronic obstructive pulmonary disease) [facility])    Past Surgical History  Procedure Laterality Date  . Circumcision  1972  . Laceration repair  02/2004    arthroscopic debridement of triagular fibrocartilage tear/E-chart; right wrist  . Cardiac catheterization  12/24/11    normal coronary anatomy, EF 55-65%  . Left heart catheterization with coronary angiogram N/A 12/24/2011    Procedure: LEFT HEART CATHETERIZATION WITH CORONARY ANGIOGRAM;  Surgeon: Peter M Martinique, MD;  Location: [facility] CATH LAB;  Service: Cardiovascular;  Laterality: N/A;   History reviewed. No pertinent family history. Social History  Substance Use Topics  . Smoking status: Former Research scientist (life sciences)  . Smokeless tobacco: Never Used  . Alcohol Use: Yes      Comment: soc    Review of Systems  All other systems reviewed and are negative.     Allergies  Review of patient's allergies indicates no known allergies.  Home Medications   Prior to Admission medications   Medication Sig Start Date End Date Taking? Authorizing Provider  albuterol (PROVENTIL HFA;VENTOLIN HFA) 108 (90 BASE) MCG/ACT inhaler Inhale 1-2 puffs into the lungs every 6 (six) hours as needed for wheezing or shortness of breath. 04/03/15  Yes Donne Hazel, MD  albuterol (PROVENTIL) (2.5 MG/3ML) 0.083% nebulizer solution Take 3-6 mLs (2.5-5 mg total) by nebulization every 4 (four) hours as needed for wheezing or shortness of breath. 03/12/15  Yes Shanon Rosser, MD  budesonide-formoterol (SYMBICORT) 160-4.5 MCG/ACT inhaler Inhale 2 puffs into the lungs 2 (two) times daily. 05/13/15  Yes Francesca Oman, DO  finasteride (PROSCAR) 5 MG tablet Take 5 mg by mouth daily.   Yes Historical Provider, MD  flunisolide (NASALIDE) 25 MCG/ACT (0.025%) SOLN Place 2 sprays into the nose 2 (two) times daily as needed (nasal congestion).   Yes Historical Provider, MD  guaiFENesin (MUCINEX) 600 MG 12 hr tablet Take by mouth 2 (two) times daily as needed for cough or to loosen phlegm.   Yes Historical Provider, MD  lisinopril-hydrochlorothiazide (PRINZIDE,ZESTORETIC) 10-12.5 MG per tablet Take 1 tablet by mouth daily.   Yes Historical Provider, MD  omeprazole (PRILOSEC) 20 MG capsule Take 20 mg by mouth 2 (two) times daily as needed (for acid reflux).    Yes Historical Provider,  MD  Phenyleph-Doxylamine-DM-APAP (NYQUIL SEVERE COLD/FLU) 5-6.25-10-325 MG/15ML LIQD Take 30 mLs by mouth at bedtime as needed (for cold/sleep).   Yes Historical Provider, MD  terazosin (HYTRIN) 5 MG capsule Take 5 mg by mouth at bedtime.   Yes Historical Provider, MD  albuterol (PROVENTIL HFA;VENTOLIN HFA) 108 (90 BASE) MCG/ACT inhaler Inhale 1-2 puffs into the lungs every 6 (six) hours as needed for wheezing or shortness of  breath. Patient not taking: Reported on 08/13/2015 07/18/15   April Palumbo, MD  predniSONE (DELTASONE) 20 MG tablet Take 1 tablet (20 mg total) by mouth 2 (two) times daily. 08/13/15   Daleen Bo, MD  Respiratory Therapy Supplies (NEBULIZER) DEVI 1 Device by Does not apply route as needed. 04/03/15   Donne Hazel, MD   BP 114/61 mmHg  Pulse 94  Temp(Src) 98.3 F (36.8 C) (Oral)  Resp 26  Wt 154 lb 6 oz (70.024 kg)  SpO2 86% Physical Exam  Constitutional: He is oriented to person, place, and time. He appears well-developed and well-nourished.  HENT:  Head: Normocephalic and atraumatic.  Right Ear: External ear normal.  Left Ear: External ear normal.  Eyes: Conjunctivae and EOM are normal. Pupils are equal, round, and reactive to light.  Neck: Normal range of motion and phonation normal. Neck supple.  Cardiovascular: Normal rate, regular rhythm and normal heart sounds.   Pulmonary/Chest: Effort normal. He exhibits no bony tenderness.  Decrease her mom bilaterally with scattered rhonchi and wheezes. No increased work of breathing.  Abdominal: Soft. There is no tenderness.  Musculoskeletal: Normal range of motion. He exhibits no edema or tenderness.  Neurological: He is alert and oriented to person, place, and time. No cranial nerve deficit or sensory deficit. He exhibits normal muscle tone. Coordination normal.  Skin: Skin is warm, dry and intact.  Psychiatric: He has a normal mood and affect. His behavior is normal. Judgment and thought content normal.  Nursing note and vitals reviewed.   ED Course  Procedures (including critical care time)  Medications  albuterol (PROVENTIL) (2.5 MG/3ML) 0.083% nebulizer solution 5 mg ( Nebulization Not Given 08/13/15 1400)  albuterol (PROVENTIL,VENTOLIN) solution continuous neb (10 mg/hr Nebulization Given 08/13/15 1959)  predniSONE (DELTASONE) tablet 60 mg (60 mg Oral Given 08/13/15 1912)    Patient Vitals for the past 24 hrs:  BP Temp  Temp src Pulse Resp SpO2 Weight  08/13/15 2215 114/61 mmHg - - 94 26 (!) 86 % -  08/13/15 2130 144/85 mmHg - - 102 15 100 % -  08/13/15 2102 117/88 mmHg - - 88 15 100 % -  08/13/15 2100 117/88 mmHg - - 89 15 97 % -  08/13/15 2030 134/88 mmHg - - 72 18 99 % -  08/13/15 2015 136/79 mmHg - - 75 19 97 % -  08/13/15 2000 128/79 mmHg - - 76 13 100 % -  08/13/15 1959 - - - - - 98 % -  08/13/15 1945 135/94 mmHg - - 81 22 97 % -  08/13/15 1930 112/78 mmHg - - 66 23 95 % -  08/13/15 1915 125/75 mmHg - - 84 22 98 % -  08/13/15 1900 110/71 mmHg - - 86 23 95 % -  08/13/15 1845 112/66 mmHg - - 86 18 96 % -  08/13/15 1830 110/68 mmHg - - 75 25 96 % -  08/13/15 1815 115/62 mmHg - - 84 19 96 % -  08/13/15 1800 116/60 mmHg - - 75 26 96 % -  08/13/15 1753 123/67 mmHg - - 84 19 97 % -  08/13/15 1355 132/85 mmHg 98.3 F (36.8 C) Oral 99 (!) 28 97 % 154 lb 6 oz (70.024 kg)    At discharge- Reevaluation with update and discussion. After initial assessment and treatment, an updated evaluation reveals he feels better. Findings discussed with the patient, all questions were answered. [place] Review Labs Reviewed - No data to display  Imaging Review Dg Chest 2 View  08/13/2015  CLINICAL DATA:  Shortness of breath cough wheezing history of COPD EXAM: CHEST  2 VIEW COMPARISON:  08/04/2015 FINDINGS: Heart size and vascular pattern normal. Bilateral nipple shadows identified. No consolidation or effusion. Hyperinflation consistent with COPD with stable chronic bronchitic change. IMPRESSION: COPD with no acute findings Electronically Signed   By: Skipper Cliche M.D.   On: 08/13/2015 14:29   I have personally reviewed and evaluated these images and lab results as part of my medical decision-making.   EKG Interpretation   Date/Time:  Tuesday August 13 2015 13:55:16 EST Ventricular Rate:  97 PR Interval:  150 QRS Duration: 82 QT Interval:  354 QTC Calculation: 449 R Axis:   58 Text  Interpretation:  Sinus rhythm with occasional Premature ventricular  complexes Right atrial enlargement Borderline ECG since last tracing no  significant change Confirmed by Eulis Foster  MD, Vira Agar [id number]) on 08/13/2015  6:54:41 PM      MDM   Final diagnoses:  Acute bronchitis, unspecified organism   Evaluation consistent with COPD exacerbation. No evidence for pneumonia, metabolic instability or serious bacterial infection.   Nursing Notes Reviewed/ Care Coordinated Applicable Imaging Reviewed Interpretation of Laboratory Data incorporated into ED treatment  The patient appears reasonably screened and/or stabilized for discharge and I doubt any other medical condition or other [facility] requiring further screening, evaluation, or treatment in the ED at this time prior to discharge.  Plan: Home Medications- Prednisone; Home Treatments- rest; return here if the recommended treatment, does not improve the symptoms; Recommended follow up- PCP follow-up one week     Daleen Bo, MD 08/14/15 0126

## 2015-08-13 NOTE — ED Notes (Signed)
Called RT for cont neb.

## 2015-08-13 NOTE — ED Notes (Signed)
Pt sts SOB with hx of COPD; pt appears labored at present; pt sts productive cough and HA

## 2015-08-13 NOTE — ED Notes (Signed)
Gave pt bus pass per pt request

## 2015-08-13 NOTE — Discharge Instructions (Signed)

## 2015-09-18 ENCOUNTER — Emergency Department (HOSPITAL_COMMUNITY): Payer: Non-veteran care

## 2015-09-18 ENCOUNTER — Inpatient Hospital Stay (HOSPITAL_COMMUNITY)
Admission: EM | Admit: 2015-09-18 | Discharge: 2015-11-07 | DRG: 004 | Disposition: A | Discharge status: Home / Self Care | Payer: Non-veteran care | Attending: General Surgery | Admitting: General Surgery

## 2015-09-18 DIAGNOSIS — J942 Hemothorax: Secondary | ICD-10-CM

## 2015-09-18 DIAGNOSIS — E870 Hyperosmolality and hypernatremia: Secondary | ICD-10-CM | POA: Diagnosis not present

## 2015-09-18 DIAGNOSIS — S225XXA Flail chest, initial encounter for closed fracture: Secondary | ICD-10-CM | POA: Diagnosis present

## 2015-09-18 DIAGNOSIS — S272XXA Traumatic hemopneumothorax, initial encounter: Principal | ICD-10-CM | POA: Diagnosis present

## 2015-09-18 DIAGNOSIS — N5089 Other specified disorders of the male genital organs: Secondary | ICD-10-CM | POA: Diagnosis present

## 2015-09-18 DIAGNOSIS — F1290 Cannabis use, unspecified, uncomplicated: Secondary | ICD-10-CM | POA: Diagnosis present

## 2015-09-18 DIAGNOSIS — K567 Ileus, unspecified: Secondary | ICD-10-CM | POA: Diagnosis not present

## 2015-09-18 DIAGNOSIS — Z452 Encounter for adjustment and management of vascular access device: Secondary | ICD-10-CM

## 2015-09-18 DIAGNOSIS — E46 Unspecified protein-calorie malnutrition: Secondary | ICD-10-CM | POA: Diagnosis not present

## 2015-09-18 DIAGNOSIS — J9621 Acute and chronic respiratory failure with hypoxia: Secondary | ICD-10-CM | POA: Diagnosis present

## 2015-09-18 DIAGNOSIS — F10129 Alcohol abuse with intoxication, unspecified: Secondary | ICD-10-CM | POA: Diagnosis present

## 2015-09-18 DIAGNOSIS — Z6822 Body mass index (BMI) 22.0-22.9, adult: Secondary | ICD-10-CM

## 2015-09-18 DIAGNOSIS — R001 Bradycardia, unspecified: Secondary | ICD-10-CM | POA: Diagnosis not present

## 2015-09-18 DIAGNOSIS — J45909 Unspecified asthma, uncomplicated: Secondary | ICD-10-CM | POA: Diagnosis not present

## 2015-09-18 DIAGNOSIS — J9601 Acute respiratory failure with hypoxia: Secondary | ICD-10-CM | POA: Diagnosis not present

## 2015-09-18 DIAGNOSIS — J449 Chronic obstructive pulmonary disease, unspecified: Secondary | ICD-10-CM | POA: Diagnosis present

## 2015-09-18 DIAGNOSIS — J690 Pneumonitis due to inhalation of food and vomit: Secondary | ICD-10-CM | POA: Diagnosis present

## 2015-09-18 DIAGNOSIS — Z9689 Presence of other specified functional implants: Secondary | ICD-10-CM

## 2015-09-18 DIAGNOSIS — S060X9A Concussion with loss of consciousness of unspecified duration, initial encounter: Secondary | ICD-10-CM | POA: Diagnosis present

## 2015-09-18 DIAGNOSIS — R319 Hematuria, unspecified: Secondary | ICD-10-CM | POA: Diagnosis not present

## 2015-09-18 DIAGNOSIS — R579 Shock, unspecified: Secondary | ICD-10-CM | POA: Diagnosis not present

## 2015-09-18 DIAGNOSIS — S32302A Unspecified fracture of left ilium, initial encounter for closed fracture: Secondary | ICD-10-CM | POA: Diagnosis not present

## 2015-09-18 DIAGNOSIS — T797XXA Traumatic subcutaneous emphysema, initial encounter: Secondary | ICD-10-CM | POA: Diagnosis not present

## 2015-09-18 DIAGNOSIS — Y907 Blood alcohol level of 200-239 mg/100 ml: Secondary | ICD-10-CM | POA: Diagnosis present

## 2015-09-18 DIAGNOSIS — R791 Abnormal coagulation profile: Secondary | ICD-10-CM | POA: Diagnosis not present

## 2015-09-18 DIAGNOSIS — R578 Other shock: Secondary | ICD-10-CM | POA: Diagnosis present

## 2015-09-18 DIAGNOSIS — S2242XA Multiple fractures of ribs, left side, initial encounter for closed fracture: Secondary | ICD-10-CM

## 2015-09-18 DIAGNOSIS — S27322A Contusion of lung, bilateral, initial encounter: Secondary | ICD-10-CM | POA: Diagnosis present

## 2015-09-18 DIAGNOSIS — L8990 Pressure ulcer of unspecified site, unspecified stage: Secondary | ICD-10-CM | POA: Diagnosis not present

## 2015-09-18 DIAGNOSIS — Z7951 Long term (current) use of inhaled steroids: Secondary | ICD-10-CM

## 2015-09-18 DIAGNOSIS — E876 Hypokalemia: Secondary | ICD-10-CM | POA: Diagnosis not present

## 2015-09-18 DIAGNOSIS — N179 Acute kidney failure, unspecified: Secondary | ICD-10-CM | POA: Diagnosis present

## 2015-09-18 DIAGNOSIS — S2232XA Fracture of one rib, left side, initial encounter for closed fracture: Secondary | ICD-10-CM

## 2015-09-18 DIAGNOSIS — K219 Gastro-esophageal reflux disease without esophagitis: Secondary | ICD-10-CM | POA: Diagnosis present

## 2015-09-18 DIAGNOSIS — Z978 Presence of other specified devices: Secondary | ICD-10-CM

## 2015-09-18 DIAGNOSIS — S37012A Minor contusion of left kidney, initial encounter: Secondary | ICD-10-CM | POA: Diagnosis present

## 2015-09-18 DIAGNOSIS — R140 Abdominal distension (gaseous): Secondary | ICD-10-CM | POA: Diagnosis not present

## 2015-09-18 DIAGNOSIS — F17200 Nicotine dependence, unspecified, uncomplicated: Secondary | ICD-10-CM | POA: Diagnosis not present

## 2015-09-18 DIAGNOSIS — J80 Acute respiratory distress syndrome: Secondary | ICD-10-CM | POA: Diagnosis present

## 2015-09-18 DIAGNOSIS — Z79899 Other long term (current) drug therapy: Secondary | ICD-10-CM

## 2015-09-18 DIAGNOSIS — S329XXA Fracture of unspecified parts of lumbosacral spine and pelvis, initial encounter for closed fracture: Secondary | ICD-10-CM

## 2015-09-18 DIAGNOSIS — J9811 Atelectasis: Secondary | ICD-10-CM | POA: Diagnosis present

## 2015-09-18 DIAGNOSIS — R29898 Other symptoms and signs involving the musculoskeletal system: Secondary | ICD-10-CM

## 2015-09-18 DIAGNOSIS — E872 Acidosis: Secondary | ICD-10-CM | POA: Diagnosis not present

## 2015-09-18 DIAGNOSIS — N4889 Other specified disorders of penis: Secondary | ICD-10-CM | POA: Diagnosis present

## 2015-09-18 DIAGNOSIS — D62 Acute posthemorrhagic anemia: Secondary | ICD-10-CM | POA: Diagnosis not present

## 2015-09-18 DIAGNOSIS — Z23 Encounter for immunization: Secondary | ICD-10-CM

## 2015-09-18 DIAGNOSIS — Z01818 Encounter for other preprocedural examination: Secondary | ICD-10-CM

## 2015-09-18 DIAGNOSIS — I10 Essential (primary) hypertension: Secondary | ICD-10-CM | POA: Diagnosis present

## 2015-09-18 DIAGNOSIS — E874 Mixed disorder of acid-base balance: Secondary | ICD-10-CM | POA: Diagnosis not present

## 2015-09-18 DIAGNOSIS — J939 Pneumothorax, unspecified: Secondary | ICD-10-CM

## 2015-09-18 DIAGNOSIS — J9600 Acute respiratory failure, unspecified whether with hypoxia or hypercapnia: Secondary | ICD-10-CM

## 2015-09-18 DIAGNOSIS — S299XXA Unspecified injury of thorax, initial encounter: Secondary | ICD-10-CM

## 2015-09-18 DIAGNOSIS — S2249XA Multiple fractures of ribs, unspecified side, initial encounter for closed fracture: Secondary | ICD-10-CM | POA: Diagnosis present

## 2015-09-18 DIAGNOSIS — R0902 Hypoxemia: Secondary | ICD-10-CM

## 2015-09-18 DIAGNOSIS — S2249XS Multiple fractures of ribs, unspecified side, sequela: Secondary | ICD-10-CM | POA: Diagnosis not present

## 2015-09-18 DIAGNOSIS — S2249XD Multiple fractures of ribs, unspecified side, subsequent encounter for fracture with routine healing: Secondary | ICD-10-CM | POA: Diagnosis not present

## 2015-09-18 DIAGNOSIS — J9690 Respiratory failure, unspecified, unspecified whether with hypoxia or hypercapnia: Secondary | ICD-10-CM

## 2015-09-18 LAB — URINALYSIS, ROUTINE W REFLEX MICROSCOPIC
BILIRUBIN URINE: NEGATIVE
Glucose, UA: NEGATIVE mg/dL
KETONES UR: 15 mg/dL — AB
NITRITE: NEGATIVE
PROTEIN: 100 mg/dL — AB
Specific Gravity, Urine: 1.017 (ref 1.005–1.030)
pH: 5.5 (ref 5.0–8.0)

## 2015-09-18 LAB — URINE MICROSCOPIC-ADD ON

## 2015-09-18 LAB — COMPREHENSIVE METABOLIC PANEL
ALT: 66 U/L — AB (ref 17–63)
ANION GAP: 14 (ref 5–15)
AST: 92 U/L — ABNORMAL HIGH (ref 15–41)
Albumin: 3.7 g/dL (ref 3.5–5.0)
Alkaline Phosphatase: 50 U/L (ref 38–126)
BUN: 16 mg/dL (ref 6–20)
CHLORIDE: 104 mmol/L (ref 101–111)
CO2: 17 mmol/L — AB (ref 22–32)
Calcium: 8.7 mg/dL — ABNORMAL LOW (ref 8.9–10.3)
Creatinine, Ser: 1 mg/dL (ref 0.61–1.24)
Glucose, Bld: 119 mg/dL — ABNORMAL HIGH (ref 65–99)
POTASSIUM: 4.4 mmol/L (ref 3.5–5.1)
SODIUM: 135 mmol/L (ref 135–145)
Total Bilirubin: 0.5 mg/dL (ref 0.3–1.2)
Total Protein: 6.9 g/dL (ref 6.5–8.1)

## 2015-09-18 LAB — CBC
HEMATOCRIT: 37.2 % — AB (ref 39.0–52.0)
HEMOGLOBIN: 12.5 g/dL — AB (ref 13.0–17.0)
MCH: 32 pg (ref 26.0–34.0)
MCHC: 33.6 g/dL (ref 30.0–36.0)
MCV: 95.1 fL (ref 78.0–100.0)
Platelets: 220 10*3/uL (ref 150–400)
RBC: 3.91 MIL/uL — AB (ref 4.22–5.81)
RDW: 14.3 % (ref 11.5–15.5)
WBC: 11.7 10*3/uL — AB (ref 4.0–10.5)

## 2015-09-18 LAB — PREPARE FRESH FROZEN PLASMA
Unit division: 0
Unit division: 0

## 2015-09-18 LAB — PROTIME-INR
INR: 1.14 (ref 0.00–1.49)
Prothrombin Time: 14.8 seconds (ref 11.6–15.2)

## 2015-09-18 LAB — ABO/RH: ABO/RH(D): O POS

## 2015-09-18 LAB — RAPID URINE DRUG SCREEN, HOSP PERFORMED
AMPHETAMINES: NOT DETECTED
BARBITURATES: NOT DETECTED
BENZODIAZEPINES: POSITIVE — AB
Cocaine: NOT DETECTED
Opiates: NOT DETECTED
Tetrahydrocannabinol: NOT DETECTED

## 2015-09-18 LAB — SAMPLE TO BLOOD BANK

## 2015-09-18 LAB — ETHANOL: Alcohol, Ethyl (B): 218 mg/dL — ABNORMAL HIGH (ref <5)

## 2015-09-18 MED ORDER — FENTANYL CITRATE (PF) 100 MCG/2ML IJ SOLN
INTRAMUSCULAR | Status: DC | PRN
  Administered 2015-09-18 (×3): 100 ug via INTRAVENOUS

## 2015-09-18 MED ORDER — FENTANYL CITRATE (PF) 100 MCG/2ML IJ SOLN
100.0000 ug | INTRAMUSCULAR | Status: DC | PRN
  Administered 2015-09-18 – 2015-09-19 (×12): 100 ug via INTRAVENOUS
  Filled 2015-09-18 (×14): qty 2

## 2015-09-18 MED ORDER — LISINOPRIL 10 MG PO TABS: 10.0000 mg | ORAL_TABLET | Freq: Every day | ORAL | Status: DC

## 2015-09-18 MED ORDER — ONDANSETRON HCL 4 MG/2ML IJ SOLN: 4.0000 mg | Freq: Four times a day (QID) | INTRAMUSCULAR | Status: DC | PRN

## 2015-09-18 MED ORDER — TERAZOSIN HCL 5 MG PO CAPS
5.0000 mg | ORAL_CAPSULE | Freq: Every day | ORAL | Status: DC
  Administered 2015-09-19: 5 mg via ORAL
  Filled 2015-09-18 (×4): qty 1

## 2015-09-18 MED ORDER — BUDESONIDE-FORMOTEROL FUMARATE 160-4.5 MCG/ACT IN AERO
2.0000 | INHALATION_SPRAY | Freq: Two times a day (BID) | RESPIRATORY_TRACT | Status: DC
  Administered 2015-09-19: 2 via RESPIRATORY_TRACT
  Filled 2015-09-18 (×2): qty 6

## 2015-09-18 MED ORDER — FENTANYL CITRATE (PF) 100 MCG/2ML IJ SOLN
INTRAMUSCULAR | Status: AC
  Filled 2015-09-18: qty 2

## 2015-09-18 MED ORDER — ONDANSETRON HCL 4 MG/2ML IJ SOLN
INTRAMUSCULAR | Status: AC
  Filled 2015-09-18: qty 2

## 2015-09-18 MED ORDER — ENOXAPARIN SODIUM 40 MG/0.4ML ~~LOC~~ SOLN
40.0000 mg | SUBCUTANEOUS | Status: DC
  Filled 2015-09-18: qty 0.4

## 2015-09-18 MED ORDER — HYDROCHLOROTHIAZIDE 12.5 MG PO CAPS: 12.5000 mg | ORAL_CAPSULE | Freq: Every day | ORAL | Status: DC

## 2015-09-18 MED ORDER — SODIUM CHLORIDE 0.9 % IV SOLN
INTRAVENOUS | Status: DC
  Administered 2015-09-19 (×3): via INTRAVENOUS
  Administered 2015-09-20: 1000 mL via INTRAVENOUS
  Administered 2015-09-20: 12:00:00 via INTRAVENOUS
  Administered 2015-09-20: 1000 mL via INTRAVENOUS

## 2015-09-18 MED ORDER — LISINOPRIL-HYDROCHLOROTHIAZIDE 10-12.5 MG PO TABS: 1.0000 | ORAL_TABLET | Freq: Every day | ORAL | Status: DC

## 2015-09-18 MED ORDER — HYDROMORPHONE HCL 1 MG/ML IJ SOLN
1.0000 mg | INTRAMUSCULAR | Status: DC | PRN
  Administered 2015-09-18 (×2): 1 mg via INTRAVENOUS
  Administered 2015-09-19 (×2): 2 mg via INTRAVENOUS
  Filled 2015-09-18 (×3): qty 2

## 2015-09-18 MED ORDER — ONDANSETRON HCL 4 MG/2ML IJ SOLN: 4.0000 mg | Freq: Once | INTRAMUSCULAR | Status: DC

## 2015-09-18 MED ORDER — IOHEXOL 300 MG/ML  SOLN
100.0000 mL | Freq: Once | INTRAMUSCULAR | Status: AC | PRN
  Administered 2015-09-18: 100 mL via INTRAVENOUS

## 2015-09-18 MED ORDER — ONDANSETRON HCL 4 MG PO TABS: 4.0000 mg | ORAL_TABLET | Freq: Four times a day (QID) | ORAL | Status: DC | PRN

## 2015-09-18 MED ORDER — ALBUTEROL SULFATE (2.5 MG/3ML) 0.083% IN NEBU
2.5000 mg | INHALATION_SOLUTION | RESPIRATORY_TRACT | Status: DC | PRN
  Administered 2015-09-19 – 2015-11-06 (×8): 2.5 mg via RESPIRATORY_TRACT
  Filled 2015-09-18 (×8): qty 3

## 2015-09-18 MED ORDER — ALBUTEROL SULFATE HFA 108 (90 BASE) MCG/ACT IN AERS: 1.0000 | INHALATION_SPRAY | Freq: Four times a day (QID) | RESPIRATORY_TRACT | Status: DC | PRN

## 2015-09-18 MED ORDER — PANTOPRAZOLE SODIUM 40 MG PO TBEC
40.0000 mg | DELAYED_RELEASE_TABLET | Freq: Every day | ORAL | Status: DC
  Administered 2015-09-19 – 2015-09-23 (×3): 40 mg via ORAL
  Filled 2015-09-18 (×4): qty 1

## 2015-09-18 MED ORDER — FENTANYL CITRATE (PF) 100 MCG/2ML IJ SOLN
50.0000 ug | Freq: Once | INTRAMUSCULAR | Status: DC
  Administered 2015-09-18: 50 ug via INTRAVENOUS

## 2015-09-18 MED ORDER — FINASTERIDE 5 MG PO TABS
5.0000 mg | ORAL_TABLET | Freq: Every day | ORAL | Status: DC
  Administered 2015-09-19 – 2015-09-20 (×2): 5 mg via ORAL
  Filled 2015-09-18 (×3): qty 1

## 2015-09-18 MED ORDER — ALBUTEROL SULFATE (2.5 MG/3ML) 0.083% IN NEBU
2.5000 mg | INHALATION_SOLUTION | Freq: Once | RESPIRATORY_TRACT | Status: AC
  Administered 2015-09-18: 2.5 mg via RESPIRATORY_TRACT
  Filled 2015-09-18: qty 3

## 2015-09-18 MED ORDER — MIDAZOLAM HCL 2 MG/2ML IJ SOLN
4.0000 mg | Freq: Once | INTRAMUSCULAR | Status: AC
  Administered 2015-09-18: 4 mg via INTRAVENOUS

## 2015-09-18 MED ORDER — SODIUM CHLORIDE 0.9 % IV SOLN
INTRAVENOUS | Status: DC
  Administered 2015-09-18: 21:00:00 via INTRAVENOUS
  Administered 2015-09-20: 100 mL via INTRAVENOUS
  Administered 2015-09-21: 17:00:00 via INTRAVENOUS
  Administered 2015-09-21 – 2015-09-22 (×2): 100 mL via INTRAVENOUS
  Administered 2015-09-22: 100 mL/h via INTRAVENOUS
  Administered 2015-09-23: 100 mL via INTRAVENOUS
  Administered 2015-09-23 – 2015-09-24 (×3): via INTRAVENOUS

## 2015-09-18 MED ORDER — TETANUS-DIPHTH-ACELL PERTUSSIS 5-2.5-18.5 LF-MCG/0.5 IM SUSP
0.5000 mL | Freq: Once | INTRAMUSCULAR | Status: AC
  Administered 2015-09-18: 0.5 mL via INTRAMUSCULAR
  Filled 2015-09-18: qty 0.5

## 2015-09-18 MED ORDER — SODIUM CHLORIDE 0.9 % IV SOLN: 1000.0000 mL | Freq: Once | INTRAVENOUS | Status: DC

## 2015-09-18 MED ORDER — MIDAZOLAM HCL 2 MG/2ML IJ SOLN
INTRAMUSCULAR | Status: AC
  Filled 2015-09-18: qty 4
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
## 2015-09-18 NOTE — ED Notes (Signed)
Patient moved to hospital bed.
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
## 2015-09-18 NOTE — ED Notes (Signed)
Patient rolling side to side cursing out loud.

## 2015-09-18 NOTE — ED Notes (Signed)
Order per Dr Hulen Skains In and out cath done per protocol.  572ml bloody urine return.  Patient was restless during the procedure.  Tressa Busman, EMT present.

## 2015-09-18 NOTE — ED Notes (Signed)
Patient moves all ext well but directions are difficult since he continues to c/o pain to his ribs

## 2015-09-18 NOTE — ED Notes (Signed)
Patient transported to CT 
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
## 2015-09-19 ENCOUNTER — Inpatient Hospital Stay (HOSPITAL_COMMUNITY): Payer: Non-veteran care

## 2015-09-19 ENCOUNTER — Encounter (HOSPITAL_COMMUNITY): Payer: Self-pay | Admitting: Anesthesiology

## 2015-09-19 ENCOUNTER — Inpatient Hospital Stay (HOSPITAL_COMMUNITY): Payer: Non-veteran care | Admitting: Anesthesiology

## 2015-09-19 ENCOUNTER — Encounter (HOSPITAL_COMMUNITY): Payer: Self-pay | Admitting: *Deleted

## 2015-09-19 DIAGNOSIS — S27322A Contusion of lung, bilateral, initial encounter: Secondary | ICD-10-CM | POA: Diagnosis present

## 2015-09-19 LAB — TYPE AND SCREEN
ABO/RH(D): O POS
Antibody Screen: NEGATIVE
Unit division: 0
Unit division: 0

## 2015-09-19 LAB — CBC
HEMATOCRIT: 31.1 % — AB (ref 39.0–52.0)
HEMOGLOBIN: 10.2 g/dL — AB (ref 13.0–17.0)
MCH: 31.8 pg (ref 26.0–34.0)
MCHC: 32.8 g/dL (ref 30.0–36.0)
MCV: 96.9 fL (ref 78.0–100.0)
PLATELETS: 148 10*3/uL — AB (ref 150–400)
RBC: 3.21 MIL/uL — AB (ref 4.22–5.81)
RDW: 14.3 % (ref 11.5–15.5)
WBC: 15.3 10*3/uL — ABNORMAL HIGH (ref 4.0–10.5)

## 2015-09-19 LAB — BASIC METABOLIC PANEL
ANION GAP: 7 (ref 5–15)
BUN: 21 mg/dL — ABNORMAL HIGH (ref 6–20)
CO2: 21 mmol/L — ABNORMAL LOW (ref 22–32)
Calcium: 7.9 mg/dL — ABNORMAL LOW (ref 8.9–10.3)
Chloride: 113 mmol/L — ABNORMAL HIGH (ref 101–111)
Creatinine, Ser: 1.14 mg/dL (ref 0.61–1.24)
Glucose, Bld: 127 mg/dL — ABNORMAL HIGH (ref 65–99)
POTASSIUM: 4.5 mmol/L (ref 3.5–5.1)
SODIUM: 141 mmol/L (ref 135–145)

## 2015-09-19 LAB — I-STAT ARTERIAL BLOOD GAS, ED
Acid-base deficit: 5 mmol/L — ABNORMAL HIGH (ref 0.0–2.0)
Bicarbonate: 21.2 mEq/L (ref 20.0–24.0)
O2 SAT: 93 %
PCO2 ART: 41.4 mmHg (ref 35.0–45.0)
PH ART: 7.318 — AB (ref 7.350–7.450)
Patient temperature: 98.6
TCO2: 22 mmol/L (ref 0–100)
pO2, Arterial: 71 mmHg — ABNORMAL LOW (ref 80.0–100.0)

## 2015-09-19 LAB — MRSA PCR SCREENING: MRSA BY PCR: NEGATIVE

## 2015-09-19 LAB — BLOOD PRODUCT ORDER (VERBAL) VERIFICATION

## 2015-09-19 LAB — CDS SEROLOGY

## 2015-09-19 MED ORDER — SODIUM CHLORIDE 0.9 % IV BOLUS (SEPSIS)
500.0000 mL | Freq: Once | INTRAVENOUS | Status: AC
  Administered 2015-09-19: 500 mL via INTRAVENOUS

## 2015-09-19 MED ORDER — VITAMIN B-1 100 MG PO TABS
100.0000 mg | ORAL_TABLET | Freq: Every day | ORAL | Status: DC
  Administered 2015-09-20 – 2015-11-07 (×29): 100 mg via ORAL
  Filled 2015-09-19 (×30): qty 1

## 2015-09-19 MED ORDER — ROPIVACAINE HCL 2 MG/ML IJ SOLN
8.0000 mL/h | INTRAMUSCULAR | Status: DC
  Administered 2015-09-19 – 2015-09-20 (×2): 8 mL/h via EPIDURAL
  Filled 2015-09-19 (×2): qty 200

## 2015-09-19 MED ORDER — LORAZEPAM 2 MG/ML IJ SOLN: 1.0000 mg | Freq: Four times a day (QID) | INTRAMUSCULAR | Status: DC | PRN

## 2015-09-19 MED ORDER — LIDOCAINE HCL (PF) 1 % IJ SOLN
INTRAMUSCULAR | Status: DC | PRN
  Administered 2015-09-19: 30 mg via EPIDURAL

## 2015-09-19 MED ORDER — CETYLPYRIDINIUM CHLORIDE 0.05 % MT LIQD
7.0000 mL | Freq: Two times a day (BID) | OROMUCOSAL | Status: DC
  Administered 2015-09-19 – 2015-09-20 (×2): 7 mL via OROMUCOSAL

## 2015-09-19 MED ORDER — LIDOCAINE-EPINEPHRINE (PF) 2 %-1:200000 IJ SOLN
INTRAMUSCULAR | Status: AC
  Filled 2015-09-19: qty 20

## 2015-09-19 MED ORDER — ADULT MULTIVITAMIN W/MINERALS CH
1.0000 | ORAL_TABLET | Freq: Every day | ORAL | Status: DC
  Administered 2015-09-19 – 2015-10-14 (×22): 1 via ORAL
  Filled 2015-09-19 (×22): qty 1

## 2015-09-19 MED ORDER — LIDOCAINE-EPINEPHRINE (PF) 2 %-1:200000 IJ SOLN: 30.0000 mL | Freq: Once | INTRAMUSCULAR | Status: DC

## 2015-09-19 MED ORDER — SODIUM CHLORIDE 0.9 % IV SOLN
20.0000 ug/h | INTRAVENOUS | Status: DC
  Administered 2015-09-19: 25 ug/h via INTRAVENOUS
  Filled 2015-09-19: qty 50

## 2015-09-19 MED ORDER — LORAZEPAM 2 MG/ML IJ SOLN: 0.0000 mg | Freq: Two times a day (BID) | INTRAMUSCULAR | Status: DC

## 2015-09-19 MED ORDER — OXYCODONE HCL 5 MG PO TABS
5.0000 mg | ORAL_TABLET | ORAL | Status: DC | PRN
  Administered 2015-09-19: 15 mg via ORAL
  Administered 2015-09-19: 5 mg via ORAL
  Administered 2015-09-19: 10 mg via ORAL
  Administered 2015-09-19: 15 mg via ORAL
  Filled 2015-09-19: qty 2
  Filled 2015-09-19 (×2): qty 3
  Filled 2015-09-19: qty 1

## 2015-09-19 MED ORDER — THIAMINE HCL 100 MG/ML IJ SOLN
100.0000 mg | Freq: Every day | INTRAMUSCULAR | Status: DC
  Administered 2015-09-19 – 2015-10-21 (×20): 100 mg via INTRAVENOUS
  Filled 2015-09-19 (×23): qty 2

## 2015-09-19 MED ORDER — CHLORHEXIDINE GLUCONATE 0.12 % MT SOLN
15.0000 mL | Freq: Two times a day (BID) | OROMUCOSAL | Status: DC
Start: 2015-09-19 — End: 2015-09-20
  Administered 2015-09-19 (×2): 15 mL via OROMUCOSAL
  Filled 2015-09-19: qty 15

## 2015-09-19 MED ORDER — BUPIVACAINE-EPINEPHRINE (PF) 0.25% -1:200000 IJ SOLN
INTRAMUSCULAR | Status: DC | PRN
  Administered 2015-09-19: 5 mL

## 2015-09-19 MED ORDER — METHOCARBAMOL 1000 MG/10ML IJ SOLN
1000.0000 mg | Freq: Three times a day (TID) | INTRAVENOUS | Status: DC | PRN
  Administered 2015-09-19 (×2): 1000 mg via INTRAVENOUS
  Filled 2015-09-19 (×2): qty 10

## 2015-09-19 MED ORDER — ENOXAPARIN SODIUM 40 MG/0.4ML ~~LOC~~ SOLN: 40.0000 mg | SUBCUTANEOUS | Status: DC

## 2015-09-19 MED ORDER — LORAZEPAM 1 MG PO TABS: 1.0000 mg | ORAL_TABLET | Freq: Four times a day (QID) | ORAL | Status: DC | PRN

## 2015-09-19 MED ORDER — LORAZEPAM 2 MG/ML IJ SOLN: 0.0000 mg | Freq: Four times a day (QID) | INTRAMUSCULAR | Status: DC

## 2015-09-19 MED ORDER — LORAZEPAM 2 MG/ML IJ SOLN
1.0000 mg | Freq: Once | INTRAMUSCULAR | Status: AC
  Administered 2015-09-19: 1 mg via INTRAVENOUS
  Filled 2015-09-19: qty 1

## 2015-09-19 MED ORDER — FOLIC ACID 1 MG PO TABS
1.0000 mg | ORAL_TABLET | Freq: Every day | ORAL | Status: DC
  Administered 2015-09-19 – 2015-09-20 (×2): 1 mg via ORAL
  Filled 2015-09-19 (×2): qty 1

## 2015-09-19 NOTE — ED Notes (Signed)
Encouraged deep breathing and coughing.  Patient attempted to follow but was hesitant to cough.  Left breast/chest area with slight crepitus and decreased breath sounds
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
## 2015-09-19 NOTE — Progress Notes (Signed)
Called Dr. Hulen Skains to inform that pt had to be bumped up to 50% fi02 venturi mask to maintain o2 sat between 88 and 93%. No further orders received but instructed to call anesthesia Dr. Oletta Lamas about possible epidural placement. Dr. Oletta Lamas was spoke with and told this nurse that an OR case was about to be started and it would be some time before it could be done. Dr. Hulen Skains called back and informed. Will continue to closely monitor. Eleonore Chiquito RN 2 [place]

## 2015-09-19 NOTE — Progress Notes (Signed)
Utilization review completed. Jahan Friedlander, RN, BSN. 

## 2015-09-19 NOTE — Progress Notes (Signed)
Patient ID: Name [name] [name], male   DOB: Mar 27, 1951, 65 y.o.   MRN: [id number]    Subjective: Pain L ribs  Objective: Vital signs in last 24 hours: Temp:  [99.2 F (37.3 C)] 99.2 F (37.3 C) (01/05 0728) Pulse Rate:  [61-132] 108 (01/05 0700) Resp:  [12-35] 35 (01/05 0700) BP: (79-122)/(40-81) 110/60 mmHg (01/05 0700) SpO2:  [90 %-100 %] 90 % (01/05 0700) FiO2 (%):  [50 %-100 %] 50 % (01/05 0700) Weight:  [75.6 kg (166 lb 10.7 oz)] 75.6 kg (166 lb 10.7 oz) (01/05 0645)    Intake/Output from previous day: 01/04 0701 - 01/05 0700 In: 3050 [I.V.:2550; IV Piggyback:500] Out: 1100 [Urine:1100] Intake/Output this shift:    General appearance: cooperative Neck: no posterior midline tenderness, no pain on AROM, collar removed Resp: wheezes bilaterally Chest wall: left sided chest wall tenderness Cardio: regular rate and rhythm GI: soft, NT, ND Neuro: mild agitation, F/C  Lab Results: CBC   Recent Labs  09/18/15 1838 09/19/15 0615  WBC 11.7* 15.3*  HGB 12.5* 10.2*  HCT 37.2* 31.1*  PLT 220 148*   BMET  Recent Labs  09/18/15 1838 09/19/15 0615  NA 135 141  K 4.4 4.5  CL 104 113*  CO2 17* 21*  GLUCOSE 119* 127*  BUN 16 21*  CREATININE 1.00 1.14  CALCIUM 8.7* 7.9*   PT/INR  Recent Labs  09/18/15 1838  LABPROT 14.8  INR 1.14   ABG  Recent Labs  09/19/15 0312  PHART 7.318*  HCO3 21.2    Studies/Results:CXR - Stable appearance of the left hemithorax since a study of earlier today. A less than 5% pneumothorax persists. A trace of pleural fluid on the left is stable as well.  Assessment/Plan: [facility]  L rib FXs 2-7, 11-12 with small HPTX - anesthesia to evaluate for epidural, pain control and pulmonary toilet. Bronchodilators. Pneumo and effusion remain very small. L iliac wing FX - appreciate Dr. Debroah Loop eval, TDWB ETOH abuse - start CIWA Concussion - therapies Grade 1 L renal contusion ABL anemia - F/U FEN - Lovenox Dispo - ICU for resp failure   LOS: 1 day    Georganna Skeans, MD, MPH, FACS Trauma: [phone] General Surgery: [phone]  09/19/2015

## 2015-09-19 NOTE — Progress Notes (Signed)
Patient has developed more subcutaneous airon the lleft and oxygenation seems worse.  Breath sounds continue top be decreased on the left side, but the CXR does not show any larger PTX--there is more subcutaneous air.ABG 7.32/41/71/21.  Respirations are 24-30.  Improved aeration with Albuterol treatment.  I have asked anesthesia to come evaluate the patient for an epidural catheter for pain control which I believe is the patient's primary problem.  Will upgrade admission to ICU.  Kathryne Eriksson. Dahlia Bailiff, MD, [place] [phone] Trauma Surgeon

## 2015-09-19 NOTE — ED Notes (Addendum)
Patient continues to grunt with each breath.  Dr Hulen Skains notified of change in xray and patients inability to get comfortable.  HOB up

## 2015-09-19 NOTE — Progress Notes (Signed)
09/19/2015 6:29 PM Nursing note On call anesthesia MD paged and made aware of pt. Still with significant pain despite increasing Epidural to maximum dose ordered by Dr. Gifford Shave. Pt. Also being administered prn fentanyl and oxycodone in addition to epidural. MD to come see patient. Will continue to monitor.  Mikhail Hallenbeck, Arville Lime

## 2015-09-19 NOTE — ED Notes (Signed)
Charge nurse notified of change in status

## 2015-09-19 NOTE — Anesthesia Procedure Notes (Signed)
Epidural Patient location during procedure: ICU  Staffing Anesthesiologist: Catalina Gravel Performed by: anesthesiologist   Preanesthetic Checklist Completed: patient identified, pre-op evaluation, timeout performed, IV checked, risks and benefits discussed and monitors and equipment checked  Epidural Patient position: sitting Prep: DuraPrep Patient monitoring: blood pressure, continuous pulse ox and heart rate Approach: midline Location: thoracic (1-12) (T5-T6) Injection technique: LOR air  Needle:  Needle type: Tuohy  Needle gauge: 17 G Needle length: 9 cm Needle insertion depth: 5 cm Catheter type: closed end flexible Catheter size: 19 Gauge Catheter at skin depth: 9 cm Test dose: negative and Other (1% Lidocaine)  Additional Notes Patient identified.  Risk benefits discussed including failed block, incomplete pain control, headache, nerve damage, paralysis, blood pressure changes, nausea, vomiting, reactions to medication both toxic or allergic, and postpartum back pain.  Patient expressed understanding and wished to proceed.  All questions were answered.  Sterile technique used throughout procedure and epidural site dressed with sterile barrier dressing. No paresthesia or other complications noted. The patient did not experience any signs of intravascular injection such as tinnitus or metallic taste in mouth nor signs of intrathecal spread such as rapid motor block. Please see nursing notes for vital signs. Reason for block:procedure for pain

## 2015-09-19 NOTE — Progress Notes (Signed)
Dr Grandville Silos notified of pt's continued tachycardia 120s, tachypnea  with RR 40s,  And  increased WOB  With   rhonchus breath sounds  throughout and crepitus on left.  Pt has  constant  10/10 pain without relief from Fentanyl 100 mg  q 1 hr  and Bupivicaine drip at 20 mcg/hr .  Orders received  for continuous BIPAP per respiratory protocol, and  Fentanyl gtt. Will continue to monitor pt and try to get him more comfortable.

## 2015-09-19 NOTE — ED Notes (Signed)
Spoke with Dr Claudine Mouton regarding ordering another chest xray.  Patient with slight retractions to the left chest and patient unable to get comfortable in the bed.

## 2015-09-19 NOTE — ED Notes (Signed)
Dr Wyatt at bedside.  
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
## 2015-09-19 NOTE — Progress Notes (Signed)
Placed patient on BiPAP due to increased WOB and decreased SpO2. RT will continue to monitor.

## 2015-09-19 NOTE — Progress Notes (Signed)
Dr. Hulen Skains called to inform of ongoing pain medication needs and current bp of 90/49 with a MAP of 62. Order received for 538ml Normal Saline bolus. Will continue to monitor closely.  Eleonore Chiquito RN 2 [place]

## 2015-09-19 NOTE — Progress Notes (Signed)
PT Cancellation Note  [name] MRN: [id number] DOB: 1950/12/06   Cancelled Treatment:    Reason Eval/Treat Not Completed: Medical issues which prohibited therapy;Pain limiting ability to participate.  Not appropriate for therapy today.  Has epidural in place.  Will try to see as able 1/6. 09/19/2015  Donnella Sham, PT [phone]  (pager)   Anthonella Klausner, Tessie Fass 09/19/2015, 5:22 PM

## 2015-09-20 ENCOUNTER — Inpatient Hospital Stay (HOSPITAL_COMMUNITY): Payer: Non-veteran care

## 2015-09-20 ENCOUNTER — Encounter (HOSPITAL_COMMUNITY): Payer: Self-pay | Admitting: Certified Registered Nurse Anesthetist

## 2015-09-20 ENCOUNTER — Encounter (HOSPITAL_COMMUNITY): Payer: Non-veteran care | Admitting: Certified Registered Nurse Anesthetist

## 2015-09-20 DIAGNOSIS — R579 Shock, unspecified: Secondary | ICD-10-CM

## 2015-09-20 DIAGNOSIS — J9601 Acute respiratory failure with hypoxia: Secondary | ICD-10-CM

## 2015-09-20 LAB — BLOOD GAS, ARTERIAL
ACID-BASE DEFICIT: 8.4 mmol/L — AB (ref 0.0–2.0)
Acid-base deficit: 6.9 mmol/L — ABNORMAL HIGH (ref 0.0–2.0)
Bicarbonate: 20.1 mEq/L (ref 20.0–24.0)
Bicarbonate: 19.8 mEq/L — ABNORMAL LOW (ref 20.0–24.0)
DRAWN BY: 331761
Drawn by: 437071
FIO2: 0.6
FIO2: 0.7
MECHVT: 510 mL
MECHVT: 560 mL
O2 Saturation: 88.6 %
O2 Saturation: 92.6 %
PEEP/CPAP: 5 cmH2O
PEEP: 8 cmH2O
PH ART: 7.088 — AB (ref 7.350–7.450)
PO2 ART: 67 mmHg — AB (ref 80.0–100.0)
PO2 ART: 85 mmHg (ref 80.0–100.0)
Patient temperature: 98.6
Patient temperature: 100.9
RATE: 14 resp/min
RATE: 18 resp/min
TCO2: 21.9 mmol/L (ref 0–100)
TCO2: 21.8 mmol/L (ref 0–100)
pCO2 arterial: 56.2 mmHg — ABNORMAL HIGH (ref 35.0–45.0)
pCO2 arterial: 70.3 mmHg (ref 35.0–45.0)
pH, Arterial: 7.18 — CL (ref 7.350–7.450)

## 2015-09-20 LAB — CBC WITH DIFFERENTIAL/PLATELET
BASOS ABS: 0 10*3/uL (ref 0.0–0.1)
Basophils Absolute: 0 10*3/uL (ref 0.0–0.1)
Basophils Relative: 0 %
Basophils Relative: 0 %
Eosinophils Absolute: 0.1 10*3/uL (ref 0.0–0.7)
Eosinophils Absolute: 0.1 10*3/uL (ref 0.0–0.7)
Eosinophils Relative: 1 %
Eosinophils Relative: 3 %
HCT: 27.4 % — ABNORMAL LOW (ref 39.0–52.0)
HCT: 24.8 % — ABNORMAL LOW (ref 39.0–52.0)
Hemoglobin: 8.9 g/dL — ABNORMAL LOW (ref 13.0–17.0)
Hemoglobin: 8 g/dL — ABNORMAL LOW (ref 13.0–17.0)
LYMPHS ABS: 0.9 10*3/uL (ref 0.7–4.0)
Lymphocytes Relative: 14 %
Lymphocytes Relative: 19 %
Lymphs Abs: 1.3 10*3/uL (ref 0.7–4.0)
MCH: 32.4 pg (ref 26.0–34.0)
MCH: 32.4 pg (ref 26.0–34.0)
MCHC: 32.5 g/dL (ref 30.0–36.0)
MCHC: 32.3 g/dL (ref 30.0–36.0)
MCV: 99.6 fL (ref 78.0–100.0)
MCV: 100.4 fL — ABNORMAL HIGH (ref 78.0–100.0)
MONO ABS: 0.2 10*3/uL (ref 0.1–1.0)
MONOS PCT: 4 %
Monocytes Absolute: 0.5 10*3/uL (ref 0.1–1.0)
Monocytes Relative: 5 %
Neutro Abs: 7.7 10*3/uL (ref 1.7–7.7)
Neutro Abs: 3.7 10*3/uL (ref 1.7–7.7)
Neutrophils Relative %: 80 %
Neutrophils Relative %: 74 %
Platelets: 124 10*3/uL — ABNORMAL LOW (ref 150–400)
Platelets: 120 10*3/uL — ABNORMAL LOW (ref 150–400)
RBC: 2.75 MIL/uL — ABNORMAL LOW (ref 4.22–5.81)
RBC: 2.47 MIL/uL — AB (ref 4.22–5.81)
RDW: 14.4 % (ref 11.5–15.5)
RDW: 14.3 % (ref 11.5–15.5)
WBC: 9.7 10*3/uL (ref 4.0–10.5)
WBC: 4.9 10*3/uL (ref 4.0–10.5)

## 2015-09-20 LAB — CBC
HCT: 31.2 % — ABNORMAL LOW (ref 39.0–52.0)
HCT: 24.8 % — ABNORMAL LOW (ref 39.0–52.0)
Hemoglobin: 10.1 g/dL — ABNORMAL LOW (ref 13.0–17.0)
Hemoglobin: 8 g/dL — ABNORMAL LOW (ref 13.0–17.0)
MCH: 32.5 pg (ref 26.0–34.0)
MCH: 32.1 pg (ref 26.0–34.0)
MCHC: 32.4 g/dL (ref 30.0–36.0)
MCHC: 32.3 g/dL (ref 30.0–36.0)
MCV: 100.3 fL — ABNORMAL HIGH (ref 78.0–100.0)
MCV: 99.6 fL (ref 78.0–100.0)
PLATELETS: 103 10*3/uL — AB (ref 150–400)
Platelets: 108 10*3/uL — ABNORMAL LOW (ref 150–400)
RBC: 3.11 MIL/uL — AB (ref 4.22–5.81)
RBC: 2.49 MIL/uL — ABNORMAL LOW (ref 4.22–5.81)
RDW: 14.8 % (ref 11.5–15.5)
RDW: 14.3 % (ref 11.5–15.5)
WBC: 15.6 10*3/uL — ABNORMAL HIGH (ref 4.0–10.5)
WBC: 4 10*3/uL (ref 4.0–10.5)

## 2015-09-20 LAB — POCT I-STAT 3, ART BLOOD GAS (G3+)
Acid-base deficit: 8 mmol/L — ABNORMAL HIGH (ref 0.0–2.0)
Acid-base deficit: 10 mmol/L — ABNORMAL HIGH (ref 0.0–2.0)
Acid-base deficit: 6 mmol/L — ABNORMAL HIGH (ref 0.0–2.0)
Bicarbonate: 22.7 mEq/L (ref 20.0–24.0)
Bicarbonate: 19.9 mEq/L — ABNORMAL LOW (ref 20.0–24.0)
Bicarbonate: 21.1 mEq/L (ref 20.0–24.0)
O2 Saturation: 89 %
O2 Saturation: 82 %
O2 Saturation: 88 %
Patient temperature: 101
Patient temperature: 100
Patient temperature: 100
TCO2: 25 mmol/L (ref 0–100)
TCO2: 22 mmol/L (ref 0–100)
TCO2: 23 mmol/L (ref 0–100)
pCO2 arterial: 83.8 mmHg (ref 35.0–45.0)
pCO2 arterial: 54 mmHg — ABNORMAL HIGH (ref 35.0–45.0)
pCO2 arterial: 67.1 mmHg (ref 35.0–45.0)
pH, Arterial: 7.05 — CL (ref 7.350–7.450)
pH, Arterial: 7.084 — CL (ref 7.350–7.450)
pH, Arterial: 7.205 — ABNORMAL LOW (ref 7.350–7.450)
pO2, Arterial: 88 mmHg (ref 80.0–100.0)
pO2, Arterial: 60 mmHg — ABNORMAL LOW (ref 80.0–100.0)
pO2, Arterial: 80 mmHg (ref 80.0–100.0)

## 2015-09-20 LAB — PROCALCITONIN: Procalcitonin: 22.99 ng/mL

## 2015-09-20 LAB — BASIC METABOLIC PANEL
ANION GAP: 6 (ref 5–15)
Anion gap: 10 (ref 5–15)
Anion gap: 5 (ref 5–15)
BUN: 29 mg/dL — AB (ref 6–20)
BUN: 32 mg/dL — ABNORMAL HIGH (ref 6–20)
BUN: 31 mg/dL — ABNORMAL HIGH (ref 6–20)
CALCIUM: 8.1 mg/dL — AB (ref 8.9–10.3)
CALCIUM: 7.4 mg/dL — AB (ref 8.9–10.3)
CHLORIDE: 112 mmol/L — AB (ref 101–111)
CO2: 20 mmol/L — ABNORMAL LOW (ref 22–32)
CO2: 23 mmol/L (ref 22–32)
CO2: 23 mmol/L (ref 22–32)
CREATININE: 2.17 mg/dL — AB (ref 0.61–1.24)
CREATININE: 2.36 mg/dL — AB (ref 0.61–1.24)
Calcium: 7.3 mg/dL — ABNORMAL LOW (ref 8.9–10.3)
Chloride: 115 mmol/L — ABNORMAL HIGH (ref 101–111)
Chloride: 113 mmol/L — ABNORMAL HIGH (ref 101–111)
Creatinine, Ser: 2.2 mg/dL — ABNORMAL HIGH (ref 0.61–1.24)
GFR calc Af Amer: 35 mL/min — ABNORMAL LOW (ref >60)
GFR calc non Af Amer: 27 mL/min — ABNORMAL LOW (ref >60)
GFR calc non Af Amer: 30 mL/min — ABNORMAL LOW (ref >60)
GFR, EST AFRICAN AMERICAN: 32 mL/min — AB (ref >60)
GFR, EST AFRICAN AMERICAN: 35 mL/min — AB (ref >60)
GFR, EST NON AFRICAN AMERICAN: 30 mL/min — AB (ref >60)
Glucose, Bld: 74 mg/dL (ref 65–99)
Glucose, Bld: 123 mg/dL — ABNORMAL HIGH (ref 65–99)
Glucose, Bld: 111 mg/dL — ABNORMAL HIGH (ref 65–99)
POTASSIUM: 5.7 mmol/L — AB (ref 3.5–5.1)
POTASSIUM: 4.9 mmol/L (ref 3.5–5.1)
Potassium: 5.1 mmol/L (ref 3.5–5.1)
SODIUM: 145 mmol/L (ref 135–145)
SODIUM: 141 mmol/L (ref 135–145)
Sodium: 141 mmol/L (ref 135–145)

## 2015-09-20 LAB — HEPATIC FUNCTION PANEL
ALT: 45 U/L (ref 17–63)
AST: 92 U/L — AB (ref 15–41)
Albumin: 2.4 g/dL — ABNORMAL LOW (ref 3.5–5.0)
Alkaline Phosphatase: 44 U/L (ref 38–126)
Bilirubin, Direct: 0.4 mg/dL (ref 0.1–0.5)
Indirect Bilirubin: 0.6 mg/dL (ref 0.3–0.9)
TOTAL PROTEIN: 4.9 g/dL — AB (ref 6.5–8.1)
Total Bilirubin: 1 mg/dL (ref 0.3–1.2)

## 2015-09-20 LAB — PROTIME-INR
INR: 1.79 — ABNORMAL HIGH (ref 0.00–1.49)
INR: 1.9 — AB (ref 0.00–1.49)
INR: 2.01 — AB (ref 0.00–1.49)
PROTHROMBIN TIME: 20.8 s — AB (ref 11.6–15.2)
PROTHROMBIN TIME: 21.7 s — AB (ref 11.6–15.2)
Prothrombin Time: 22.6 seconds — ABNORMAL HIGH (ref 11.6–15.2)

## 2015-09-20 LAB — CK TOTAL AND CKMB (NOT AT ARMC)
CK TOTAL: 2847 U/L — AB (ref 49–397)
CK, MB: 8.6 ng/mL — AB (ref 0.5–5.0)
RELATIVE INDEX: 0.3 (ref 0.0–2.5)

## 2015-09-20 LAB — LACTIC ACID, PLASMA
Lactic Acid, Venous: 1 mmol/L (ref 0.5–2.0)
Lactic Acid, Venous: 1.4 mmol/L (ref 0.5–2.0)

## 2015-09-20 LAB — TRIGLYCERIDES: TRIGLYCERIDES: 180 mg/dL — AB (ref <150)

## 2015-09-20 LAB — TROPONIN I: TROPONIN I: 0.03 ng/mL (ref <0.031)

## 2015-09-20 LAB — LIPASE, BLOOD: Lipase: 12 U/L (ref 11–51)

## 2015-09-20 MED ORDER — SODIUM CHLORIDE 0.9 % IV SOLN
25.0000 ug/h | INTRAVENOUS | Status: DC
  Filled 2015-09-20: qty 50

## 2015-09-20 MED ORDER — IPRATROPIUM-ALBUTEROL 0.5-2.5 (3) MG/3ML IN SOLN
3.0000 mL | Freq: Four times a day (QID) | RESPIRATORY_TRACT | Status: DC
  Administered 2015-09-20 – 2015-10-04 (×57): 3 mL via RESPIRATORY_TRACT
  Filled 2015-09-20 (×57): qty 3

## 2015-09-20 MED ORDER — ALBUMIN HUMAN 5 % IV SOLN
12.5000 g | Freq: Once | INTRAVENOUS | Status: AC
  Administered 2015-09-20: 12.5 g via INTRAVENOUS
  Filled 2015-09-20: qty 250

## 2015-09-20 MED ORDER — MIDAZOLAM HCL 2 MG/2ML IJ SOLN: 2.0000 mg | Freq: Once | INTRAMUSCULAR | Status: DC

## 2015-09-20 MED ORDER — SODIUM BICARBONATE 8.4 % IV SOLN
INTRAVENOUS | Status: AC
  Administered 2015-09-20: 100 meq via INTRAVENOUS
  Filled 2015-09-20: qty 100

## 2015-09-20 MED ORDER — ARTIFICIAL TEARS OP OINT
1.0000 "application " | TOPICAL_OINTMENT | Freq: Three times a day (TID) | OPHTHALMIC | Status: DC
  Administered 2015-09-20: 1 via OPHTHALMIC
  Filled 2015-09-20: qty 3.5

## 2015-09-20 MED ORDER — FENTANYL CITRATE (PF) 100 MCG/2ML IJ SOLN: 100.0000 ug | Freq: Once | INTRAMUSCULAR | Status: DC

## 2015-09-20 MED ORDER — VECURONIUM BROMIDE 10 MG IV SOLR
INTRAVENOUS | Status: AC
  Filled 2015-09-20: qty 10

## 2015-09-20 MED ORDER — VECURONIUM BROMIDE 10 MG IV SOLR
0.8000 ug/kg/min | INTRAVENOUS | Status: DC
  Administered 2015-09-20: 1.2 ug/kg/min via INTRAVENOUS
  Administered 2015-09-20: 1 ug/kg/min via INTRAVENOUS
  Filled 2015-09-20 (×2): qty 100

## 2015-09-20 MED ORDER — VASOPRESSIN 20 UNIT/ML IV SOLN
0.0300 [IU]/min | INTRAVENOUS | Status: DC
  Administered 2015-09-20 – 2015-09-23 (×3): 0.03 [IU]/min via INTRAVENOUS
  Filled 2015-09-20 (×6): qty 2

## 2015-09-20 MED ORDER — MIDAZOLAM HCL 2 MG/2ML IJ SOLN: 2.0000 mg | INTRAMUSCULAR | Status: DC | PRN

## 2015-09-20 MED ORDER — SODIUM CHLORIDE 0.9 % IV SOLN: Freq: Once | INTRAVENOUS | Status: DC

## 2015-09-20 MED ORDER — LIDOCAINE HCL (PF) 1 % IJ SOLN
INTRAMUSCULAR | Status: AC
  Filled 2015-09-20: qty 10

## 2015-09-20 MED ORDER — SODIUM CHLORIDE 0.9 % IV SOLN: 25.0000 ug/h | INTRAVENOUS | Status: DC

## 2015-09-20 MED ORDER — ANTISEPTIC ORAL RINSE SOLUTION (CORINZ)
7.0000 mL | OROMUCOSAL | Status: DC
  Administered 2015-09-20 – 2015-10-02 (×126): 7 mL via OROMUCOSAL

## 2015-09-20 MED ORDER — PANTOPRAZOLE SODIUM 40 MG IV SOLR
40.0000 mg | Freq: Every day | INTRAVENOUS | Status: DC
  Administered 2015-09-20: 40 mg via INTRAVENOUS
  Filled 2015-09-20: qty 40

## 2015-09-20 MED ORDER — NOREPINEPHRINE BITARTRATE 1 MG/ML IV SOLN
0.0000 ug/min | INTRAVENOUS | Status: DC
  Administered 2015-09-20: 40 ug/min via INTRAVENOUS
  Administered 2015-09-20: 10 ug/min via INTRAVENOUS
  Filled 2015-09-20 (×2): qty 4

## 2015-09-20 MED ORDER — CHLORHEXIDINE GLUCONATE 0.12% ORAL RINSE (MEDLINE KIT)
15.0000 mL | Freq: Two times a day (BID) | OROMUCOSAL | Status: DC
  Administered 2015-09-20 – 2015-10-02 (×26): 15 mL via OROMUCOSAL

## 2015-09-20 MED ORDER — SODIUM BICARBONATE 8.4 % IV SOLN
100.0000 meq | Freq: Once | INTRAVENOUS | Status: AC
  Administered 2015-09-20: 100 meq via INTRAVENOUS

## 2015-09-20 MED ORDER — SODIUM CHLORIDE 0.9 % IV SOLN: 0.0000 mg/h | INTRAVENOUS | Status: DC

## 2015-09-20 MED ORDER — SODIUM CHLORIDE 0.9 % IV SOLN
0.5000 ug/kg/min | INTRAVENOUS | Status: DC
  Administered 2015-09-20: 0.5 ug/kg/min via INTRAVENOUS
  Filled 2015-09-20: qty 20

## 2015-09-20 MED ORDER — SUCCINYLCHOLINE CHLORIDE 20 MG/ML IJ SOLN
INTRAMUSCULAR | Status: DC | PRN
  Administered 2015-09-20: 120 mg via INTRAVENOUS

## 2015-09-20 MED ORDER — ETOMIDATE 2 MG/ML IV SOLN
INTRAVENOUS | Status: DC | PRN
  Administered 2015-09-20: 10 mg via INTRAVENOUS

## 2015-09-20 MED ORDER — MIDAZOLAM BOLUS VIA INFUSION: 1.0000 mg | INTRAVENOUS | Status: DC | PRN

## 2015-09-20 MED ORDER — ALBUMIN HUMAN 5 % IV SOLN
INTRAVENOUS | Status: AC
  Administered 2015-09-20: 25 g
  Filled 2015-09-20: qty 500

## 2015-09-20 MED ORDER — MIDAZOLAM BOLUS VIA INFUSION
2.0000 mg | INTRAVENOUS | Status: DC | PRN
  Filled 2015-09-20: qty 2

## 2015-09-20 MED ORDER — FENTANYL BOLUS VIA INFUSION: 50.0000 ug | INTRAVENOUS | Status: DC | PRN

## 2015-09-20 MED ORDER — VECURONIUM BOLUS VIA INFUSION
0.0800 mg/kg | Freq: Once | INTRAVENOUS | Status: DC
Start: 2015-09-20 — End: 2015-09-20
  Filled 2015-09-20: qty 6

## 2015-09-20 MED ORDER — SODIUM CHLORIDE 0.9 % IV SOLN
INTRAVENOUS | Status: DC | PRN
  Administered 2015-09-23 – 2015-10-04 (×3): 10 mL/h via INTRA_ARTERIAL

## 2015-09-20 MED ORDER — SODIUM CHLORIDE 0.9 % IV SOLN
1.0000 mg/h | INTRAVENOUS | Status: DC
  Administered 2015-09-20: 1 mg/h via INTRAVENOUS
  Administered 2015-09-21: 6 mg/h via INTRAVENOUS
  Filled 2015-09-20 (×2): qty 10

## 2015-09-20 MED ORDER — DEXTROSE 5 % IV SOLN
0.0000 ug/min | INTRAVENOUS | Status: DC
  Administered 2015-09-20: 80 ug/min via INTRAVENOUS
  Administered 2015-09-20: 70 ug/min via INTRAVENOUS
  Administered 2015-09-20 (×2): 40 ug/min via INTRAVENOUS
  Administered 2015-09-21: 45 ug/min via INTRAVENOUS
  Administered 2015-09-21: 33 ug/min via INTRAVENOUS
  Administered 2015-09-22: 25 ug/min via INTRAVENOUS
  Administered 2015-09-22: 8 ug/min via INTRAVENOUS
  Filled 2015-09-20 (×8): qty 16

## 2015-09-20 MED ORDER — ALBUMIN HUMAN 5 % IV SOLN: 25.0000 g | Freq: Once | INTRAVENOUS | Status: DC

## 2015-09-20 MED ORDER — SODIUM CHLORIDE 0.9 % IV SOLN
500.0000 mL | Freq: Once | INTRAVENOUS | Status: AC
  Administered 2015-09-20: 500 mL via INTRAVENOUS

## 2015-09-20 MED ORDER — FENTANYL BOLUS VIA INFUSION
50.0000 ug | INTRAVENOUS | Status: DC | PRN
  Filled 2015-09-20: qty 50

## 2015-09-20 MED ORDER — FENTANYL CITRATE (PF) 100 MCG/2ML IJ SOLN: 50.0000 ug | Freq: Once | INTRAMUSCULAR | Status: DC

## 2015-09-20 MED ORDER — MIDAZOLAM HCL 2 MG/2ML IJ SOLN: 2.0000 mg | Freq: Once | INTRAMUSCULAR | Status: DC | PRN

## 2015-09-20 MED ORDER — PROPOFOL 1000 MG/100ML IV EMUL
0.0000 ug/kg/min | INTRAVENOUS | Status: DC
  Administered 2015-09-20 (×2): 5 ug/kg/min via INTRAVENOUS
  Filled 2015-09-20 (×2): qty 100

## 2015-09-20 MED ORDER — LIDOCAINE HCL (CARDIAC) 20 MG/ML IV SOLN
INTRAVENOUS | Status: AC
  Filled 2015-09-20: qty 5

## 2015-09-20 MED ORDER — PIPERACILLIN-TAZOBACTAM 3.375 G IVPB
3.3750 g | Freq: Three times a day (TID) | INTRAVENOUS | Status: AC
  Administered 2015-09-20 – 2015-09-26 (×19): 3.375 g via INTRAVENOUS
  Filled 2015-09-20 (×19): qty 50

## 2015-09-20 MED ORDER — VECURONIUM BROMIDE 10 MG IV SOLR
10.0000 mg | Freq: Once | INTRAVENOUS | Status: AC
  Administered 2015-09-20: 10 mg via INTRAVENOUS

## 2015-09-20 MED ORDER — FENTANYL CITRATE (PF) 100 MCG/2ML IJ SOLN: 100.0000 ug | Freq: Once | INTRAMUSCULAR | Status: DC | PRN

## 2015-09-20 MED ORDER — SODIUM CHLORIDE 0.9 % IV SOLN: 500.0000 mL | Freq: Once | INTRAVENOUS | Status: DC

## 2015-09-20 MED ORDER — SODIUM CHLORIDE 0.9 % IV BOLUS (SEPSIS)
2000.0000 mL | Freq: Once | INTRAVENOUS | Status: AC
  Administered 2015-09-20: 2000 mL via INTRAVENOUS

## 2015-09-20 MED ORDER — ARTIFICIAL TEARS OP OINT
1.0000 "application " | TOPICAL_OINTMENT | Freq: Three times a day (TID) | OPHTHALMIC | Status: DC
  Administered 2015-09-20 – 2015-09-21 (×2): 1 via OPHTHALMIC

## 2015-09-20 MED ORDER — SODIUM BICARBONATE 8.4 % IV SOLN
INTRAVENOUS | Status: DC
  Administered 2015-09-20 – 2015-09-21 (×2): via INTRAVENOUS
  Filled 2015-09-20 (×3): qty 150

## 2015-09-20 NOTE — Procedures (Signed)
Central Venous Catheter Insertion Procedure Note [name] [name] [id number] Mar 27, 1951  Procedure: Insertion of Central Venous Catheter Indications: Assessment of intravascular volume and Drug and/or fluid administration  Procedure Details Consent: Unable to obtain consent because of emergent medical necessity. Time Out: Verified patient identification, verified procedure, site/side was marked, verified correct patient position, special equipment/implants available, medications/allergies/relevent history reviewed, required imaging and test results available.  Performed  Maximum sterile technique was used including antiseptics, cap, gloves, gown, hand hygiene, mask and sheet. Skin prep: Chlorhexidine; local anesthetic administered A antimicrobial bonded/coated triple lumen catheter was placed in the left subclavian vein using the Seldinger technique.  Evaluation Blood flow good Complications: No apparent complications Patient did tolerate procedure well. Chest X-ray ordered to verify placement.  CXR: pending.  [name] [name] 09/20/2015, 3:11 PM

## 2015-09-20 NOTE — Procedures (Signed)
Arterial Catheter Insertion Procedure Note [name] [name] [id number] 08/07/1951  Procedure: Insertion of Arterial Catheter  Indications: Blood pressure monitoring and Frequent blood sampling  Procedure Details Consent: Unable to obtain due to patient being intubated and paralyzed. Time Out: Verified patient identification, verified procedure, site/side was marked, verified correct patient position, special equipment/implants available, medications/allergies/relevent history reviewed, required imaging and test results available.  Performed  Maximum sterile technique was used including cap, gloves, gown, hand hygiene, mask and sheet. Skin prep: Chlorhexidine; local anesthetic administered 20 gauge catheter was inserted into left radial artery using the Seldinger technique.  Evaluation Blood flow good; BP tracing good. Complications: No apparent complications   Assisted by Ciro Backer RRT, RCP.   Claretta Fraise 09/20/2015

## 2015-09-20 NOTE — Progress Notes (Signed)
Attempted to see pt this morning.  Pt was intubated this am and nursing stating to hold of on this pt for now with decline in respiratory status.  Feel it is reasonable to check back on this pt over the weekend (Sunday) if schedule allows. Jinger Neighbors, [place] [id number]

## 2015-09-20 NOTE — Progress Notes (Signed)
Patient ID: [name] [name], male   DOB: 1951/07/23, 65 y.o.   MRN: [id number] Worsening respiratory failure despite efforts. Will proceed with intubation and check CXR. Georganna Skeans, MD, MPH, FACS Trauma: [phone] General Surgery: [phone]

## 2015-09-20 NOTE — Progress Notes (Addendum)
eLink Pharmacist-Brief Progress Note Patient Name: [name] [name] DOB: 11-09-1950 MRN: [id number]   Date of Service  09/20/2015  HPI/Events of Note  SCr 1.14>2.36, K trending up. Pt ordered to continue home lisinopril  eICU Interventions  Holding lisinopril due to AKI   eLink Attending: Margretta Sidle P 09/20/2015, 4:01 PM
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
## 2015-09-20 NOTE — Progress Notes (Signed)
PT Cancellation Note  Patient Details Name: Bettie Quebodeaux MRN: [id number] DOB: 1950-10-04   Cancelled Treatment:    Reason Eval/Treat Not Completed: Medical issues which prohibited therapy.  Pt newly intubated on paralytics.  Will see as able. 09/20/2015  Donnella Sham, [place] [phone]  (pager)   Karita Dralle, Tessie Fass 09/20/2015, 12:54 PM

## 2015-09-20 NOTE — Progress Notes (Signed)
Patient pulmonary status declined overnight, intubated this morning.  Sedated, paralyzed on ventilator.  Also on norepinephrine infusion for hypotension.  Epidural running at 8cc/hr, but question how much benefit patient was receiving from it despite initial pain relief yesterday.  Discussed case with primary team and bedside nurse.  Coags requested and INR found to be elevated at 1.79.  Platelets 124k.   Plan: - Given current condition (intubated, sedated, paralyzed), will stop epidural infusion and cap epidural catheter. - Given coagulopathy, unable to safely remove epidural catheter. - Will continue to follow and discontinue epidural catheter once coagulation status allows.  Hoy Morn, MD Anesthesiology

## 2015-09-20 NOTE — Progress Notes (Signed)
Patient ID: [name] [name], male   DOB: Oct 01, 1950, 65 y.o.   MRN: [id number] I called his daughter and updated her on his status. Georganna Skeans, MD, MPH, FACS Trauma: [phone] General Surgery: [phone]
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
## 2015-09-20 NOTE — Procedures (Signed)
Chest Tube Insertion Procedure Note  Indications:  Clinically significant Pneumothorax and Hemothorax  Pre-operative Diagnosis: Pneumothorax and Hemothorax  Post-operative Diagnosis: Pneumothorax and Hemothorax  Procedure Details  Informed consent was obtained for the procedure, including sedation.  Risks of lung perforation, hemorrhage, arrhythmia, and adverse drug reaction were discussed.   After sterile skin prep, using standard technique, a 32 French tube was placed in the left anterolateral 6th rib space.  Findings: 100 ml of bloody fluid obtained and minimal air under pressure  Estimated Blood Loss:  less than 100 mL         Specimens:  None              Complications:  None; patient tolerated the procedure well.         Disposition: Patient remains critical in the ICU         Condition: unstable  Attending Attestation: I performed the procedure.
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
## 2015-09-20 NOTE — Progress Notes (Signed)
100mg  of IV fentanyl WIS witnessed by   Venia Carbon RN  Maia Petties RN

## 2015-09-20 NOTE — Transfer of Care (Signed)
Immediate Anesthesia Transfer of Care Note  Patient: [name] [name]  Procedure(s) Performed: * No procedures listed *  Patient Location: ICU  Anesthesia Type:General  Level of Consciousness: sedated and Patient remains intubated per anesthesia plan  Airway & Oxygen Therapy: Patient remains intubated per anesthesia plan and Patient placed on Ventilator (see vital sign flow sheet for setting)  Post-op Assessment: Report given to RN and Post -op Vital signs reviewed and stable  Post vital signs: Reviewed and stable  Last Vitals:  Filed Vitals:   08/13/15 2130 08/13/15 2215  BP: 144/85 114/61  Pulse: 102 94  Temp:    Resp: 15 26    Complications: No apparent anesthesia complications

## 2015-09-20 NOTE — Progress Notes (Addendum)
Dr. Grandville Silos notified of ABG results: pH7.18 CO2 56 pO2 67 Bicarb 20 sat 88.  Order received to increase PEEP from 5 to 8. MD will reassess needs during morning rounds. Richarda Blade RN

## 2015-09-20 NOTE — Procedures (Signed)
Intubation Procedure Note [name] [name] [id number] November 01, 1950  Procedure: Intubation Indications: Airway protection and maintenance  Procedure Details Consent: Unable to obtain consent because of altered level of consciousness. Time Out: Verified patient identification, verified procedure, site/side was marked, verified correct patient position, special equipment/implants available, medications/allergies/relevent history reviewed, required imaging and test results available.  Performed  Maximum sterile technique was used including antiseptics, cap, gloves, gown, hand hygiene, mask and sheet.  MAC and 3    Evaluation Hemodynamic Status: BP stable throughout; O2 sats: stable throughout Patient's Current Condition: unstable Complications: No apparent complications Patient did tolerate procedure well. Chest X-ray ordered to verify placement.  CXR: tube position acceptable.   Maryruth Hancock Lattie Corns 09/20/2015
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
## 2015-09-20 NOTE — Progress Notes (Signed)
Patient has worsened since this AM with hypotension in spite of Levophed and hypoxemia in spite of FIO2 70% and PEEP +5.  Will place central line, measure CVP, give bolus of albumin and saline, and place left chest tube.  [name] [name]. Dahlia Bailiff, MD, [place] [phone] Trauma Surgeon

## 2015-09-20 NOTE — Progress Notes (Signed)
[facility] Progress Note Patient Name: Sandeep Olds DOB: 1951-08-29 MRN: [id number]   Date of Service  09/20/2015  HPI/Events of Note  Refractory shock - levophed 14mcg + vasopressin  On vec  Acidotic  meds reviewed - noted tobe on hytruin, hctz, proscar (hs foley(), ciwa ativan, fent gtt, diprivan gtt, vec gtt  cxr suggests ards/ali from contusion   eICU Interventions  Fluid bolus x 2L bic bolus and start bic gtt Dc diprivan -> do fent /gtt and versed gtt -> BIS goal < 60 Dc proscar Dc hctz Dc hytrin Dc vec -> change to nimbex  Repeat cbc, bmet, trop, lactate, lft, inr,  CXR, ABG      Intervention Category Major Interventions: Acid-Base disturbance - evaluation and management;Hypotension - evaluation and management;Respiratory failure - evaluation and management;Shock - evaluation and management  Keygan Dumond 09/20/2015, 7:54 PM

## 2015-09-21 ENCOUNTER — Inpatient Hospital Stay (HOSPITAL_COMMUNITY): Payer: Non-veteran care

## 2015-09-21 DIAGNOSIS — J9601 Acute respiratory failure with hypoxia: Secondary | ICD-10-CM

## 2015-09-21 DIAGNOSIS — N179 Acute kidney failure, unspecified: Secondary | ICD-10-CM

## 2015-09-21 DIAGNOSIS — R579 Shock, unspecified: Secondary | ICD-10-CM

## 2015-09-21 DIAGNOSIS — E872 Acidosis: Secondary | ICD-10-CM

## 2015-09-21 LAB — POCT I-STAT 3, ART BLOOD GAS (G3+)
ACID-BASE DEFICIT: 1 mmol/L (ref 0.0–2.0)
ACID-BASE DEFICIT: 1 mmol/L (ref 0.0–2.0)
Bicarbonate: 24.2 mEq/L — ABNORMAL HIGH (ref 20.0–24.0)
Bicarbonate: 23.4 mEq/L (ref 20.0–24.0)
O2 Saturation: 99 %
O2 Saturation: 99 %
PCO2 ART: 35 mmHg (ref 35.0–45.0)
PH ART: 7.433 (ref 7.350–7.450)
PO2 ART: 153 mmHg — AB (ref 80.0–100.0)
PO2 ART: 118 mmHg — AB (ref 80.0–100.0)
Patient temperature: 98.4
TCO2: 25 mmol/L (ref 0–100)
TCO2: 24 mmol/L (ref 0–100)
pCO2 arterial: 39.8 mmHg (ref 35.0–45.0)
pH, Arterial: 7.39 (ref 7.350–7.450)

## 2015-09-21 LAB — PROTIME-INR
INR: 1.63 — AB (ref 0.00–1.49)
PROTHROMBIN TIME: 19.4 s — AB (ref 11.6–15.2)

## 2015-09-21 LAB — BASIC METABOLIC PANEL
ANION GAP: 10 (ref 5–15)
Anion gap: 10 (ref 5–15)
BUN: 22 mg/dL — ABNORMAL HIGH (ref 6–20)
BUN: 18 mg/dL (ref 6–20)
CALCIUM: 7.2 mg/dL — AB (ref 8.9–10.3)
CHLORIDE: 107 mmol/L (ref 101–111)
CO2: 22 mmol/L (ref 22–32)
CO2: 23 mmol/L (ref 22–32)
CREATININE: 1.11 mg/dL (ref 0.61–1.24)
Calcium: 7.4 mg/dL — ABNORMAL LOW (ref 8.9–10.3)
Chloride: 110 mmol/L (ref 101–111)
Creatinine, Ser: 1.41 mg/dL — ABNORMAL HIGH (ref 0.61–1.24)
GFR, EST AFRICAN AMERICAN: 59 mL/min — AB (ref >60)
GFR, EST NON AFRICAN AMERICAN: 51 mL/min — AB (ref >60)
Glucose, Bld: 175 mg/dL — ABNORMAL HIGH (ref 65–99)
Glucose, Bld: 108 mg/dL — ABNORMAL HIGH (ref 65–99)
Potassium: 4.1 mmol/L (ref 3.5–5.1)
Potassium: 4.1 mmol/L (ref 3.5–5.1)
SODIUM: 140 mmol/L (ref 135–145)
Sodium: 142 mmol/L (ref 135–145)

## 2015-09-21 LAB — CBC WITH DIFFERENTIAL/PLATELET
BASOS ABS: 0 10*3/uL (ref 0.0–0.1)
Basophils Relative: 0 %
Eosinophils Absolute: 0.2 10*3/uL (ref 0.0–0.7)
Eosinophils Relative: 3 %
HEMATOCRIT: 22.9 % — AB (ref 39.0–52.0)
Hemoglobin: 7.4 g/dL — ABNORMAL LOW (ref 13.0–17.0)
LYMPHS ABS: 0.8 10*3/uL (ref 0.7–4.0)
Lymphocytes Relative: 11 %
MCH: 31.1 pg (ref 26.0–34.0)
MCHC: 32.3 g/dL (ref 30.0–36.0)
MCV: 96.2 fL (ref 78.0–100.0)
MONOS PCT: 4 %
Monocytes Absolute: 0.3 10*3/uL (ref 0.1–1.0)
Neutro Abs: 6 10*3/uL (ref 1.7–7.7)
Neutrophils Relative %: 82 %
PLATELETS: 116 10*3/uL — AB (ref 150–400)
RBC: 2.38 MIL/uL — AB (ref 4.22–5.81)
RDW: 14.3 % (ref 11.5–15.5)
WBC Morphology: INCREASED
WBC: 7.3 10*3/uL (ref 4.0–10.5)

## 2015-09-21 LAB — PREPARE FRESH FROZEN PLASMA
UNIT DIVISION: 0
Unit division: 0

## 2015-09-21 LAB — CBC
HCT: 22.1 % — ABNORMAL LOW (ref 39.0–52.0)
Hemoglobin: 7.3 g/dL — ABNORMAL LOW (ref 13.0–17.0)
MCH: 31.5 pg (ref 26.0–34.0)
MCHC: 33 g/dL (ref 30.0–36.0)
MCV: 95.3 fL (ref 78.0–100.0)
PLATELETS: 112 10*3/uL — AB (ref 150–400)
RBC: 2.32 MIL/uL — AB (ref 4.22–5.81)
RDW: 14.2 % (ref 11.5–15.5)
WBC: 11 10*3/uL — AB (ref 4.0–10.5)

## 2015-09-21 LAB — HEPATIC FUNCTION PANEL
ALT: 51 U/L (ref 17–63)
AST: 102 U/L — ABNORMAL HIGH (ref 15–41)
Albumin: 2.5 g/dL — ABNORMAL LOW (ref 3.5–5.0)
Alkaline Phosphatase: 82 U/L (ref 38–126)
BILIRUBIN INDIRECT: 0.9 mg/dL (ref 0.3–0.9)
Bilirubin, Direct: 0.5 mg/dL (ref 0.1–0.5)
TOTAL PROTEIN: 5 g/dL — AB (ref 6.5–8.1)
Total Bilirubin: 1.4 mg/dL — ABNORMAL HIGH (ref 0.3–1.2)

## 2015-09-21 LAB — TROPONIN I
TROPONIN I: 0.06 ng/mL — AB (ref <0.031)
TROPONIN I: 0.09 ng/mL — AB (ref <0.031)

## 2015-09-21 LAB — PROCALCITONIN: PROCALCITONIN: 16.91 ng/mL

## 2015-09-21 MED ORDER — DOCUSATE SODIUM 50 MG/5ML PO LIQD: 100.0000 mg | Freq: Two times a day (BID) | ORAL | Status: DC | PRN

## 2015-09-21 MED ORDER — BISACODYL 10 MG RE SUPP: 10.0000 mg | Freq: Every day | RECTAL | Status: DC | PRN

## 2015-09-21 MED ORDER — IOHEXOL 300 MG/ML  SOLN
25.0000 mL | INTRAMUSCULAR | Status: AC
  Administered 2015-09-21 (×2): 25 mL via ORAL

## 2015-09-21 MED ORDER — POLYETHYLENE GLYCOL 3350 17 G PO PACK
17.0000 g | PACK | Freq: Every day | ORAL | Status: DC | PRN
  Administered 2015-09-28: 17 g via ORAL
  Filled 2015-09-21: qty 1

## 2015-09-21 MED ORDER — MIDAZOLAM HCL 2 MG/2ML IJ SOLN
2.0000 mg | INTRAMUSCULAR | Status: AC | PRN
  Administered 2015-09-21 – 2015-09-23 (×3): 2 mg via INTRAVENOUS
  Filled 2015-09-21 (×3): qty 2

## 2015-09-21 MED ORDER — FENTANYL BOLUS VIA INFUSION
50.0000 ug | INTRAVENOUS | Status: DC | PRN
  Administered 2015-09-22 – 2015-09-28 (×13): 50 ug via INTRAVENOUS
  Filled 2015-09-21: qty 50

## 2015-09-21 MED ORDER — SODIUM CHLORIDE 0.9 % IV SOLN
25.0000 ug/h | INTRAVENOUS | Status: DC
  Administered 2015-09-21 (×2): 225 ug/h via INTRAVENOUS
  Administered 2015-09-22: 175 ug/h via INTRAVENOUS
  Administered 2015-09-22: 200 ug/h via INTRAVENOUS
  Administered 2015-09-23: 100 ug/h via INTRAVENOUS
  Administered 2015-09-24 (×2): 350 ug/h via INTRAVENOUS
  Administered 2015-09-24: 300 ug/h via INTRAVENOUS
  Administered 2015-09-25: 350 ug/h via INTRAVENOUS
  Administered 2015-09-25 – 2015-10-01 (×24): 400 ug/h via INTRAVENOUS
  Administered 2015-10-02: 300 ug/h via INTRAVENOUS
  Administered 2015-10-02: 400 ug/h via INTRAVENOUS
  Administered 2015-10-02 – 2015-10-03 (×2): 300 ug/h via INTRAVENOUS
  Administered 2015-10-03: 250 ug/h via INTRAVENOUS
  Administered 2015-10-04: 30 ug/h via INTRAVENOUS
  Administered 2015-10-04 (×2): 300 ug/h via INTRAVENOUS
  Administered 2015-10-05: 150 ug/h via INTRAVENOUS
  Administered 2015-10-05: 300 ug/h via INTRAVENOUS
  Administered 2015-10-06 – 2015-10-07 (×5): 250 ug/h via INTRAVENOUS
  Administered 2015-10-08 – 2015-10-09 (×3): 300 ug/h via INTRAVENOUS
  Administered 2015-10-09 (×3): 400 ug/h via INTRAVENOUS
  Administered 2015-10-10: 200 ug/h via INTRAVENOUS
  Administered 2015-10-10: 300 ug/h via INTRAVENOUS
  Administered 2015-10-11 – 2015-10-12 (×6): 400 ug/h via INTRAVENOUS
  Administered 2015-10-13: 350 ug/h via INTRAVENOUS
  Administered 2015-10-13: 225 ug/h via INTRAVENOUS
  Administered 2015-10-13: 275 ug/h via INTRAVENOUS
  Administered 2015-10-14 (×2): 200 ug/h via INTRAVENOUS
  Filled 2015-09-21 (×68): qty 50

## 2015-09-21 MED ORDER — MIDAZOLAM HCL 2 MG/2ML IJ SOLN
2.0000 mg | INTRAMUSCULAR | Status: DC | PRN
  Administered 2015-09-21 – 2015-09-30 (×41): 2 mg via INTRAVENOUS
  Filled 2015-09-21 (×43): qty 2

## 2015-09-21 MED ORDER — FENTANYL CITRATE (PF) 100 MCG/2ML IJ SOLN: 50.0000 ug | Freq: Once | INTRAMUSCULAR | Status: DC

## 2015-09-21 NOTE — Progress Notes (Signed)
*  PRELIMINARY RESULTS* Echocardiogram 2D Echocardiogram has been performed.  Leavy Cella 09/21/2015, 10:19 AM
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
## 2015-09-21 NOTE — Progress Notes (Signed)
Follow up - Trauma and Critical Care  Patient Details:    [name] [name] is an 65 y.o. male.  Lines/tubes : Airway 8 mm (Active)  Secured at (cm) 23 cm 09/21/2015  3:18 AM  Measured From Lips 09/21/2015  3:18 AM  Secured Location Center 09/21/2015  3:18 AM  Secured By [employer] 09/21/2015  3:18 AM  Tube Holder Repositioned Yes 09/21/2015  3:18 AM  Cuff Pressure (cm H2O) 25 cm H2O 09/21/2015  3:18 AM  Site Condition Dry 09/21/2015  3:18 AM     CVC Triple Lumen 09/20/15 Left Subclavian (Active)  Indication for Insertion or Continuance of Line Vasoactive infusions 09/20/2015  8:00 PM  Site Assessment Clean;Dry;Intact 09/20/2015  8:00 PM  Proximal Lumen Status Infusing 09/20/2015  8:00 PM  Medial Infusing 09/20/2015  8:00 PM  Distal Lumen Status Infusing 09/20/2015  8:00 PM  Dressing Type Transparent 09/20/2015  8:00 PM  Dressing Status Clean;Dry;Intact;Antimicrobial disc in place 09/20/2015  8:00 PM  Line Care Connections checked and tightened;Leveled 09/20/2015  8:00 PM  Dressing Change Due 09/27/15 09/20/2015  8:00 PM     Arterial Line 09/20/15 Left Radial (Active)  Site Assessment Clean;Dry;Intact 09/20/2015  8:00 PM  Line Status Pulsatile blood flow 09/20/2015  8:00 PM  Art Line Waveform Appropriate 09/20/2015  8:00 PM  Art Line Interventions Zeroed and calibrated;Leveled;Connections checked and tightened;Flushed per protocol 09/20/2015  8:00 PM  Color/Movement/Sensation Capillary refill less than 3 sec 09/20/2015  8:00 PM  Dressing Type Transparent;Securing device 09/20/2015  8:00 PM  Dressing Status Clean;Dry;Intact 09/20/2015  8:00 PM     Epidural Catheter 09/19/15 (Active)  Site Assessment Clean;Dry;Intact 09/20/2015  8:00 PM  Line Status Capped 09/20/2015  8:00 PM  Dressing Type Transparent 09/20/2015  8:00 PM  Dressing Status Intact;Old drainage 09/20/2015  8:00 PM     Chest Tube 1 Left Pleural (Active)  Suction -20 cm H2O 09/20/2015  8:30 PM  Chest Tube Air Leak None 09/20/2015  8:30 PM  Patency  Intervention Tip/tilt 09/20/2015  8:30 PM  Drainage Description Serosanguineous 09/20/2015  8:30 PM  Dressing Status Clean;Dry;Intact 09/20/2015  8:30 PM  Surrounding Skin Unable to view 09/20/2015  8:30 PM  Output (mL) 20 mL 09/21/2015  6:00 AM     NG/OG Tube Orogastric Center mouth (Active)  Placement Verification Auscultation 09/20/2015  8:30 PM  Site Assessment Clean;Dry 09/20/2015  8:30 PM  Status Suction-low intermittent 09/20/2015  8:30 PM  Drainage Appearance Yellow 09/20/2015  8:30 PM  Output (mL) 250 mL 09/20/2015  7:00 AM     Urethral Catheter Venia Carbon RN  (Active)  Indication for Insertion or Continuance of Catheter Chemically paralyzed patients;Unstable critical patients (first 24-48 hours) 09/20/2015  8:30 PM  Site Assessment Clean;Intact 09/20/2015  8:30 PM  Catheter Maintenance Bag below level of bladder;Catheter secured;Drainage bag/tubing not touching floor;Insertion date on drainage bag;No dependent loops;Seal intact 09/20/2015  8:30 PM  Collection Container Standard drainage bag 09/20/2015  8:30 PM  Securement Method Leg strap 09/20/2015  8:30 PM  Output (mL) 120 mL 09/21/2015  6:00 AM    Microbiology/Sepsis markers: Results for orders placed or performed during the hospital encounter of 09/18/15  MRSA PCR Screening     Status: None   Collection Time: 09/19/15  4:46 AM  Result Value Ref Range Status   MRSA by PCR NEGATIVE NEGATIVE Final    Comment:        The GeneXpert MRSA Assay (FDA approved for NASAL specimens only), is one component  of a comprehensive MRSA colonization surveillance program. It is not intended to diagnose MRSA infection nor to guide or monitor treatment for MRSA infections.     Anti-infectives:  Anti-infectives    Start     Dose/Rate Route Frequency Ordered Stop   09/20/15 1700  piperacillin-tazobactam (ZOSYN) IVPB 3.375 g     3.375 g 12.5 mL/hr over 240 Minutes Intravenous Every 8 hours 09/20/15 1540        Best Practice/Protocols:  VTE Prophylaxis:  Lovenox (prophylaxtic dose) Continous Sedation ARDS  Consults: Treatment Team:  Md Pccm, MD    Events: Hemodynamics worsened overnight  CCM asked to assist On inotropes intubated and on paralytics Discussed condition with Dr [place] Bells CCM   Subjective:    Overnight Issues: See above He has stabilized overnight  10 L in   Objective:  Vital signs for last 24 hours: Temp:  [97.5 F (36.4 C)-100.9 F (38.3 C)] 98.3 F (36.8 C) (01/07 0739) Pulse Rate:  [114-125] 120 (01/07 0700) Resp:  [14-28] 28 (01/07 0700) BP: (69-126)/(35-82) 90/72 mmHg (01/07 0700) SpO2:  [90 %-100 %] 97 % (01/07 0700) Arterial Line BP: (70-145)/(30-83) 105/53 mmHg (01/07 0700) FiO2 (%):  [40 %-70 %] 40 % (01/07 0630) Weight:  [80.5 kg (177 lb 7.5 oz)] 80.5 kg (177 lb 7.5 oz) (01/07 0515)  Hemodynamic parameters for last 24 hours: CVP:  [10 mmHg-15 mmHg] 12 mmHg  Intake/Output from previous day: 01/06 0701 - 01/07 0700 In: 10485.7 [I.V.:7191.7; XU:[id number]; IV Piggyback:2600] Out: 2960 [Urine:2795; Chest Tube:165]  Intake/Output this shift:    Vent settings for last 24 hours: Vent Mode:  [-] PRVC FiO2 (%):  [40 %-70 %] 40 % Set Rate:  [14 bmp-28 bmp] 28 bmp Vt Set:  [510 mL-560 mL] 560 mL PEEP:  [5 cmH20-8 cmH20] 5 cmH20 Plateau Pressure:  [21 [id number] cmH20] 24 cmH20  Physical Exam:  General: intubated sedated  Neuro: paralyzed  Resp: wheezes bilateral  CVS: sinus tachycardia  GI: soft  but distended not rigid  easily reducible umbilical hernia   Results for orders placed or performed during the hospital encounter of 09/18/15 (from the past 24 hour(s))  I-STAT 3, arterial blood gas (G3+)     Status: Abnormal   Collection Time: 09/20/15  9:07 AM  Result Value Ref Range   pH, Arterial 7.050 (LL) 7.350 - 7.450   pCO2 arterial 83.8 (HH) 35.0 - 45.0 mmHg   pO2, Arterial 88.0 80.0 - 100.0 mmHg   Bicarbonate 22.7 20.0 - 24.0 mEq/L   TCO2 25 0 - 100 mmol/L   O2 Saturation 89.0 %    Acid-base deficit 8.0 (H) 0.0 - 2.0 mmol/L   Patient temperature 101.0 F    Collection site ARTERIAL LINE    Drawn by RT    Sample type ARTERIAL    Comment NOTIFIED PHYSICIAN   Protime-INR     Status: Abnormal   Collection Time: 09/20/15 11:30 AM  Result Value Ref Range   Prothrombin Time 20.8 (H) 11.6 - 15.2 seconds   INR 1.79 (H) 0.00 - 1.49  CBC with Differential/Platelet     Status: Abnormal   Collection Time: 09/20/15 11:30 AM  Result Value Ref Range   WBC 9.7 4.0 - 10.5 K/uL   RBC 2.75 (L) 4.22 - 5.81 MIL/uL   Hemoglobin 8.9 (L) 13.0 - 17.0 g/dL   HCT 27.4 (L) 39.0 - 52.0 %   MCV 99.6 78.0 - 100.0 fL   MCH 32.4 26.0 - 34.0 pg  MCHC 32.5 30.0 - 36.0 g/dL   RDW 14.4 11.5 - 15.5 %   Platelets 124 (L) 150 - 400 K/uL   Neutrophils Relative % 80 %   Neutro Abs 7.7 1.7 - 7.7 K/uL   Lymphocytes Relative 14 %   Lymphs Abs 1.3 0.7 - 4.0 K/uL   Monocytes Relative 5 %   Monocytes Absolute 0.5 0.1 - 1.0 K/uL   Eosinophils Relative 1 %   Eosinophils Absolute 0.1 0.0 - 0.7 K/uL   Basophils Relative 0 %   Basophils Absolute 0.0 0.0 - 0.1 K/uL  CBC with Differential/Platelet     Status: Abnormal   Collection Time: 09/20/15  3:00 PM  Result Value Ref Range   WBC 4.9 4.0 - 10.5 K/uL   RBC 2.47 (L) 4.22 - 5.81 MIL/uL   Hemoglobin 8.0 (L) 13.0 - 17.0 g/dL   HCT 24.8 (L) 39.0 - 52.0 %   MCV 100.4 (H) 78.0 - 100.0 fL   MCH 32.4 26.0 - 34.0 pg   MCHC 32.3 30.0 - 36.0 g/dL   RDW 14.3 11.5 - 15.5 %   Platelets 120 (L) 150 - 400 K/uL   Neutrophils Relative % 74 %   Lymphocytes Relative 19 %   Monocytes Relative 4 %   Eosinophils Relative 3 %   Basophils Relative 0 %   Neutro Abs 3.7 1.7 - 7.7 K/uL   Lymphs Abs 0.9 0.7 - 4.0 K/uL   Monocytes Absolute 0.2 0.1 - 1.0 K/uL   Eosinophils Absolute 0.1 0.0 - 0.7 K/uL   Basophils Absolute 0.0 0.0 - 0.1 K/uL   RBC Morphology POLYCHROMASIA PRESENT    WBC Morphology MILD LEFT SHIFT (1-5% METAS, OCC MYELO, OCC BANDS)   Basic metabolic panel      Status: Abnormal   Collection Time: 09/20/15  3:00 PM  Result Value Ref Range   Sodium 141 135 - 145 mmol/L   Potassium 5.1 3.5 - 5.1 mmol/L   Chloride 113 (H) 101 - 111 mmol/L   CO2 23 22 - 32 mmol/L   Glucose, Bld 123 (H) 65 - 99 mg/dL   BUN 32 (H) 6 - 20 mg/dL   Creatinine, Ser 2.36 (H) 0.61 - 1.24 mg/dL   Calcium 7.4 (L) 8.9 - 10.3 mg/dL   GFR calc non Af Amer 27 (L) >60 mL/min   GFR calc Af Amer 32 (L) >60 mL/min   Anion gap 5 5 - 15  Protime-INR     Status: Abnormal   Collection Time: 09/20/15  3:00 PM  Result Value Ref Range   Prothrombin Time 21.7 (H) 11.6 - 15.2 seconds   INR 1.90 (H) 0.00 - 1.49  Lactic acid, plasma     Status: None   Collection Time: 09/20/15  3:20 PM  Result Value Ref Range   Lactic Acid, Venous 1.0 0.5 - 2.0 mmol/L  Blood gas, arterial     Status: Abnormal   Collection Time: 09/20/15  4:23 PM  Result Value Ref Range   FIO2 0.70    Delivery systems VENTILATOR    Mode PRESSURE REGULATED VOLUME CONTROL    VT 560 mL   LHR 18 resp/min   Peep/cpap 8.0 cm H20   pH, Arterial 7.088 (LL) 7.350 - 7.450   pCO2 arterial 70.3 (HH) 35.0 - 45.0 mmHg   pO2, Arterial 85.0 80.0 - 100.0 mmHg   Bicarbonate 19.8 (L) 20.0 - 24.0 mEq/L   TCO2 21.8 0 - 100 mmol/L   Acid-base deficit 8.4 (  H) 0.0 - 2.0 mmol/L   O2 Saturation 92.6 %   Patient temperature 100.9    Collection site A-LINE    Drawn by [id number]    Sample type ARTERIAL DRAW   I-STAT 3, arterial blood gas (G3+)     Status: Abnormal   Collection Time: 09/20/15  7:30 PM  Result Value Ref Range   pH, Arterial 7.084 (LL) 7.350 - 7.450   pCO2 arterial 67.1 (HH) 35.0 - 45.0 mmHg   pO2, Arterial 80.0 80.0 - 100.0 mmHg   Bicarbonate 19.9 (L) 20.0 - 24.0 mEq/L   TCO2 22 0 - 100 mmol/L   O2 Saturation 88.0 %   Acid-base deficit 10.0 (H) 0.0 - 2.0 mmol/L   Patient temperature 100.0 F    Collection site RADIAL, ALLEN'S TEST ACCEPTABLE    Drawn by RT    Sample type ARTERIAL    Comment NOTIFIED PHYSICIAN    Basic metabolic panel     Status: Abnormal   Collection Time: 09/20/15  7:58 PM  Result Value Ref Range   Sodium 141 135 - 145 mmol/L   Potassium 4.9 3.5 - 5.1 mmol/L   Chloride 112 (H) 101 - 111 mmol/L   CO2 23 22 - 32 mmol/L   Glucose, Bld 111 (H) 65 - 99 mg/dL   BUN 31 (H) 6 - 20 mg/dL   Creatinine, Ser 2.20 (H) 0.61 - 1.24 mg/dL   Calcium 7.3 (L) 8.9 - 10.3 mg/dL   GFR calc non Af Amer 30 (L) >60 mL/min   GFR calc Af Amer 35 (L) >60 mL/min   Anion gap 6 5 - 15  Protime-INR     Status: Abnormal   Collection Time: 09/20/15  7:58 PM  Result Value Ref Range   Prothrombin Time 22.6 (H) 11.6 - 15.2 seconds   INR 2.01 (H) 0.00 - 1.49  Hepatic function panel     Status: Abnormal   Collection Time: 09/20/15  7:58 PM  Result Value Ref Range   Total Protein 4.9 (L) 6.5 - 8.1 g/dL   Albumin 2.4 (L) 3.5 - 5.0 g/dL   AST 92 (H) 15 - 41 U/L   ALT 45 17 - 63 U/L   Alkaline Phosphatase 44 38 - 126 U/L   Total Bilirubin 1.0 0.3 - 1.2 mg/dL   Bilirubin, Direct 0.4 0.1 - 0.5 mg/dL   Indirect Bilirubin 0.6 0.3 - 0.9 mg/dL  Lipase, blood     Status: None   Collection Time: 09/20/15  7:58 PM  Result Value Ref Range   Lipase 12 11 - 51 U/L  CK total and CKMB (cardiac)not at [facility]     Status: Abnormal   Collection Time: 09/20/15  7:58 PM  Result Value Ref Range   Total CK 2847 (H) 49 - 397 U/L   CK, MB 8.6 (H) 0.5 - 5.0 ng/mL   Relative Index 0.3 0.0 - 2.5  Lactic acid, plasma     Status: None   Collection Time: 09/20/15  7:58 PM  Result Value Ref Range   Lactic Acid, Venous 1.4 0.5 - 2.0 mmol/L  Troponin I     Status: None   Collection Time: 09/20/15  7:58 PM  Result Value Ref Range   Troponin I 0.03 <0.031 ng/mL  Procalcitonin - Baseline     Status: None   Collection Time: 09/20/15  7:58 PM  Result Value Ref Range   Procalcitonin 22.99 ng/mL  CBC     Status: Abnormal  Collection Time: 09/20/15  7:58 PM  Result Value Ref Range   WBC 4.0 4.0 - 10.5 K/uL   RBC 2.49 (L) 4.22 - 5.81  MIL/uL   Hemoglobin 8.0 (L) 13.0 - 17.0 g/dL   HCT 24.8 (L) 39.0 - 52.0 %   MCV 99.6 78.0 - 100.0 fL   MCH 32.1 26.0 - 34.0 pg   MCHC 32.3 30.0 - 36.0 g/dL   RDW 14.3 11.5 - 15.5 %   Platelets 108 (L) 150 - 400 K/uL  I-STAT 3, arterial blood gas (G3+)     Status: Abnormal   Collection Time: 09/20/15  8:23 PM  Result Value Ref Range   pH, Arterial 7.205 (L) 7.350 - 7.450   pCO2 arterial 54.0 (H) 35.0 - 45.0 mmHg   pO2, Arterial 60.0 (L) 80.0 - 100.0 mmHg   Bicarbonate 21.1 20.0 - 24.0 mEq/L   TCO2 23 0 - 100 mmol/L   O2 Saturation 82.0 %   Acid-base deficit 6.0 (H) 0.0 - 2.0 mmol/L   Patient temperature 100.0 F    Sample type ARTERIAL   Prepare fresh frozen plasma     Status: None   Collection Time: 09/20/15  9:29 PM  Result Value Ref Range   Unit Number [id number]    Blood Component Type THAWED PLASMA    Unit division 00    Status of Unit ISSUED,FINAL    Transfusion Status OK TO TRANSFUSE    Unit Number [id number]    Blood Component Type THAWED PLASMA    Unit division 00    Status of Unit ISSUED,FINAL    Transfusion Status OK TO TRANSFUSE   CBC with Differential/Platelet     Status: Abnormal   Collection Time: 09/21/15  4:46 AM  Result Value Ref Range   WBC 7.3 4.0 - 10.5 K/uL   RBC 2.38 (L) 4.22 - 5.81 MIL/uL   Hemoglobin 7.4 (L) 13.0 - 17.0 g/dL   HCT 22.9 (L) 39.0 - 52.0 %   MCV 96.2 78.0 - 100.0 fL   MCH 31.1 26.0 - 34.0 pg   MCHC 32.3 30.0 - 36.0 g/dL   RDW 14.3 11.5 - 15.5 %   Platelets 116 (L) 150 - 400 K/uL   Neutrophils Relative % 82 %   Lymphocytes Relative 11 %   Monocytes Relative 4 %   Eosinophils Relative 3 %   Basophils Relative 0 %   Neutro Abs 6.0 1.7 - 7.7 K/uL   Lymphs Abs 0.8 0.7 - 4.0 K/uL   Monocytes Absolute 0.3 0.1 - 1.0 K/uL   Eosinophils Absolute 0.2 0.0 - 0.7 K/uL   Basophils Absolute 0.0 0.0 - 0.1 K/uL   WBC Morphology INCREASED BANDS (>20% BANDS)   Basic metabolic panel     Status: Abnormal   Collection Time: 09/21/15  4:46  AM  Result Value Ref Range   Sodium 142 135 - 145 mmol/L   Potassium 4.1 3.5 - 5.1 mmol/L   Chloride 110 101 - 111 mmol/L   CO2 22 22 - 32 mmol/L   Glucose, Bld 175 (H) 65 - 99 mg/dL   BUN 22 (H) 6 - 20 mg/dL   Creatinine, Ser 1.41 (H) 0.61 - 1.24 mg/dL   Calcium 7.4 (L) 8.9 - 10.3 mg/dL   GFR calc non Af Amer 51 (L) >60 mL/min   GFR calc Af Amer 59 (L) >60 mL/min   Anion gap 10 5 - 15  Protime-INR     Status: Abnormal   Collection  Time: 09/21/15  4:46 AM  Result Value Ref Range   Prothrombin Time 19.4 (H) 11.6 - 15.2 seconds   INR 1.63 (H) 0.00 - 1.49  Hepatic function panel     Status: Abnormal   Collection Time: 09/21/15  4:46 AM  Result Value Ref Range   Total Protein 5.0 (L) 6.5 - 8.1 g/dL   Albumin 2.5 (L) 3.5 - 5.0 g/dL   AST 102 (H) 15 - 41 U/L   ALT 51 17 - 63 U/L   Alkaline Phosphatase 82 38 - 126 U/L   Total Bilirubin 1.4 (H) 0.3 - 1.2 mg/dL   Bilirubin, Direct 0.5 0.1 - 0.5 mg/dL   Indirect Bilirubin 0.9 0.3 - 0.9 mg/dL  Troponin I     Status: Abnormal   Collection Time: 09/21/15  4:46 AM  Result Value Ref Range   Troponin I 0.06 (H) <0.031 ng/mL  Procalcitonin     Status: None   Collection Time: 09/21/15  4:46 AM  Result Value Ref Range   Procalcitonin 16.91 ng/mL  I-STAT 3, arterial blood gas (G3+)     Status: Abnormal   Collection Time: 09/21/15  6:17 AM  Result Value Ref Range   pH, Arterial 7.390 7.350 - 7.450   pCO2 arterial 39.8 35.0 - 45.0 mmHg   pO2, Arterial 153.0 (H) 80.0 - 100.0 mmHg   Bicarbonate 24.2 (H) 20.0 - 24.0 mEq/L   TCO2 25 0 - 100 mmol/L   O2 Saturation 99.0 %   Acid-base deficit 1.0 0.0 - 2.0 mmol/L   Patient temperature 97.8 F    Sample type ARTERIAL      Assessment/Plan:    NEURO  Altered Mental Status: sedation and Get more sedated and will start temproary neuromuscular blockade.   Plan: Wean NM blockade as soon as the patient is stabilizes  PULM  More stable  Check bladder pressure to rule out compartment  syndrome  Peak pressures in 30's Appreciate CCM help    Plan: check bladder pressure   CARDIO  hypotension   Plan:improved on pressors CCM assisting Check bladder pressure  RENAL  Hematuria and output is good.   Plan: CPM  GI  Abdominal distension but not hard    Plan: repeat CT A/P without contrast to evaluate for bowel injury /solid organ injury  ID  Septic shock   Plan: on ABX and inotropic support   HEME  Anemia acute blood loss anemia)   Plan: Not at the level requiring transfusion yet  ENDO No known issues   Plan: CPM  Global Issues  Discussed with CCM Better this am Abdominal distension new  Not rigid but on paralytics will rescan abdomen Check bladder pressure to evaluate for compartment syndrome          LOS: 3 days   Additional comments:I discussed the patient's condition with Dr. Fayne Mediate .  Critical Care Total Time*: 30 Minutes  Najiyah Paris A. 09/21/2015  *Care during the described time interval was provided by me and/or other providers on the critical care team.  I have reviewed this patient's available data, including medical history, events of note, physical examination and test results as part of my evaluation.

## 2015-09-21 NOTE — Progress Notes (Signed)
This RN wasted 23ml of Propofol, and 135mL of Vecuronium in trash. Witnessed by Glenford Peers RN

## 2015-09-21 NOTE — Progress Notes (Signed)
Have attempted to obtain train of four on facial, ulnar, and post tibial nerves. Unable to obtain train of four reading. Nimbex at 0.67mcg, Versed at 3mg , Fentanyl at 173mcg. BIS 56-70. No initiation of breaths seen on ventilator. Dr. Halford Chessman aware that I am unable to get train of four reading. Patient is comfortable on current medications. Will continue medications as ordered at this time. Richarda Blade RN

## 2015-09-22 ENCOUNTER — Inpatient Hospital Stay (HOSPITAL_COMMUNITY): Payer: Non-veteran care

## 2015-09-22 DIAGNOSIS — S2249XS Multiple fractures of ribs, unspecified side, sequela: Secondary | ICD-10-CM

## 2015-09-22 DIAGNOSIS — J9621 Acute and chronic respiratory failure with hypoxia: Secondary | ICD-10-CM | POA: Diagnosis present

## 2015-09-22 LAB — CBC WITH DIFFERENTIAL/PLATELET
Basophils Absolute: 0 10*3/uL (ref 0.0–0.1)
Basophils Relative: 0 %
EOS ABS: 0.2 10*3/uL (ref 0.0–0.7)
EOS PCT: 2 %
HEMATOCRIT: 21.1 % — AB (ref 39.0–52.0)
Hemoglobin: 7 g/dL — ABNORMAL LOW (ref 13.0–17.0)
LYMPHS ABS: 0.8 10*3/uL (ref 0.7–4.0)
Lymphocytes Relative: 7 %
MCH: 31.5 pg (ref 26.0–34.0)
MCHC: 33.2 g/dL (ref 30.0–36.0)
MCV: 95 fL (ref 78.0–100.0)
MONO ABS: 0.5 10*3/uL (ref 0.1–1.0)
Monocytes Relative: 4 %
NEUTROS PCT: 87 %
Neutro Abs: 10.2 10*3/uL — ABNORMAL HIGH (ref 1.7–7.7)
PLATELETS: 120 10*3/uL — AB (ref 150–400)
RBC: 2.22 MIL/uL — AB (ref 4.22–5.81)
RDW: 14.3 % (ref 11.5–15.5)
WBC Morphology: INCREASED
WBC: 11.7 10*3/uL — ABNORMAL HIGH (ref 4.0–10.5)

## 2015-09-22 LAB — BASIC METABOLIC PANEL
Anion gap: 7 (ref 5–15)
BUN: 12 mg/dL (ref 6–20)
CALCIUM: 7.3 mg/dL — AB (ref 8.9–10.3)
CO2: 25 mmol/L (ref 22–32)
CREATININE: 0.98 mg/dL (ref 0.61–1.24)
Chloride: 107 mmol/L (ref 101–111)
GLUCOSE: 139 mg/dL — AB (ref 65–99)
Potassium: 3.8 mmol/L (ref 3.5–5.1)
Sodium: 139 mmol/L (ref 135–145)

## 2015-09-22 LAB — PROCALCITONIN: Procalcitonin: 15.18 ng/mL

## 2015-09-22 LAB — C DIFFICILE QUICK SCREEN W PCR REFLEX
C Diff antigen: NEGATIVE
C Diff interpretation: NEGATIVE
C Diff toxin: NEGATIVE

## 2015-09-22 LAB — POCT I-STAT 3, ART BLOOD GAS (G3+)
Acid-Base Excess: 4 mmol/L — ABNORMAL HIGH (ref 0.0–2.0)
BICARBONATE: 27.3 meq/L — AB (ref 20.0–24.0)
O2 SAT: 99 %
PCO2 ART: 37 mmHg (ref 35.0–45.0)
Patient temperature: 99
TCO2: 28 mmol/L (ref 0–100)
pH, Arterial: 7.476 — ABNORMAL HIGH (ref 7.350–7.450)
pO2, Arterial: 125 mmHg — ABNORMAL HIGH (ref 80.0–100.0)

## 2015-09-22 LAB — PREPARE RBC (CROSSMATCH)

## 2015-09-22 MED ORDER — BUDESONIDE 0.5 MG/2ML IN SUSP
0.5000 mg | Freq: Two times a day (BID) | RESPIRATORY_TRACT | Status: DC
  Administered 2015-09-22 – 2015-11-07 (×91): 0.5 mg via RESPIRATORY_TRACT
  Filled 2015-09-22 (×92): qty 2

## 2015-09-22 MED ORDER — SODIUM CHLORIDE 0.9 % IJ SOLN
3.0000 mL | INTRAMUSCULAR | Status: DC | PRN
  Administered 2015-09-22: 3 mL via INTRAVENOUS
  Filled 2015-09-22: qty 3

## 2015-09-22 MED ORDER — SODIUM CHLORIDE 0.9 % IV SOLN
Freq: Once | INTRAVENOUS | Status: AC
  Administered 2015-09-22: 10 mL via INTRAVENOUS

## 2015-09-22 MED ORDER — SODIUM CHLORIDE 0.9 % IJ SOLN: 10.0000 mL | INTRAMUSCULAR | Status: DC | PRN

## 2015-09-22 MED ORDER — ARFORMOTEROL TARTRATE 15 MCG/2ML IN NEBU
15.0000 ug | INHALATION_SOLUTION | Freq: Two times a day (BID) | RESPIRATORY_TRACT | Status: DC
  Administered 2015-09-22 – 2015-11-07 (×91): 15 ug via RESPIRATORY_TRACT
  Filled 2015-09-22 (×93): qty 2

## 2015-09-22 MED ORDER — SODIUM CHLORIDE 0.9 % IJ SOLN
3.0000 mL | Freq: Two times a day (BID) | INTRAMUSCULAR | Status: DC
  Administered 2015-09-23 – 2015-09-24 (×2): 3 mL via INTRAVENOUS

## 2015-09-22 MED ORDER — IOHEXOL 350 MG/ML SOLN
100.0000 mL | Freq: Once | INTRAVENOUS | Status: AC | PRN
  Administered 2015-09-22: 100 mL via INTRAVENOUS

## 2015-09-22 MED ORDER — SODIUM CHLORIDE 0.9 % IJ SOLN
10.0000 mL | Freq: Two times a day (BID) | INTRAMUSCULAR | Status: DC
  Administered 2015-09-23 – 2015-10-07 (×17): 10 mL via INTRAVENOUS
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
## 2015-09-22 NOTE — Progress Notes (Signed)
Per Elmyra Ricks RN respiratory unavailable until 1015 am to help come to CT with patient. Dr. Lake Bells notified and ok with patient waiting to come to CT.

## 2015-09-22 NOTE — Progress Notes (Signed)
While turning Pt, they became somewhat agitated, asynchronous with ventilator, reaching for ETT.   Reassurance and reorientation provided without relief. Bil soft wrist restraint applied per protocol, CCM MD to be notified of need for placing soft wrist restraints. Family to be notified at next visit.

## 2015-09-22 NOTE — Progress Notes (Addendum)
Patient ID: [name] [name], male   DOB: 1950-11-03, 64 y.o.   MRN: [id number] Follow up - Trauma and Critical Care  Patient Details:    [name] [name] is an 65 y.o. male.  Lines/tubes : Airway 8 mm (Active)  Secured at (cm) 23 cm 09/21/2015  3:18 AM  Measured From Lips 09/21/2015  3:18 AM  Secured Location Center 09/21/2015  3:18 AM  Secured By [employer] 09/21/2015  3:18 AM  Tube Holder Repositioned Yes 09/21/2015  3:18 AM  Cuff Pressure (cm H2O) 25 cm H2O 09/21/2015  3:18 AM  Site Condition Dry 09/21/2015  3:18 AM     CVC Triple Lumen 09/20/15 Left Subclavian (Active)  Indication for Insertion or Continuance of Line Vasoactive infusions 09/20/2015  8:00 PM  Site Assessment Clean;Dry;Intact 09/20/2015  8:00 PM  Proximal Lumen Status Infusing 09/20/2015  8:00 PM  Medial Infusing 09/20/2015  8:00 PM  Distal Lumen Status Infusing 09/20/2015  8:00 PM  Dressing Type Transparent 09/20/2015  8:00 PM  Dressing Status Clean;Dry;Intact;Antimicrobial disc in place 09/20/2015  8:00 PM  Line Care Connections checked and tightened;Leveled 09/20/2015  8:00 PM  Dressing Change Due 09/27/15 09/20/2015  8:00 PM     Arterial Line 09/20/15 Left Radial (Active)  Site Assessment Clean;Dry;Intact 09/20/2015  8:00 PM  Line Status Pulsatile blood flow 09/20/2015  8:00 PM  Art Line Waveform Appropriate 09/20/2015  8:00 PM  Art Line Interventions Zeroed and calibrated;Leveled;Connections checked and tightened;Flushed per protocol 09/20/2015  8:00 PM  Color/Movement/Sensation Capillary refill less than 3 sec 09/20/2015  8:00 PM  Dressing Type Transparent;Securing device 09/20/2015  8:00 PM  Dressing Status Clean;Dry;Intact 09/20/2015  8:00 PM     Epidural Catheter 09/19/15 (Active)  Site Assessment Clean;Dry;Intact 09/20/2015  8:00 PM  Line Status Capped 09/20/2015  8:00 PM  Dressing Type Transparent 09/20/2015  8:00 PM  Dressing Status Intact;Old drainage 09/20/2015  8:00 PM     Chest Tube 1 Left Pleural (Active)  Suction -20 cm H2O  09/20/2015  8:30 PM  Chest Tube Air Leak None 09/20/2015  8:30 PM  Patency Intervention Tip/tilt 09/20/2015  8:30 PM  Drainage Description Serosanguineous 09/20/2015  8:30 PM  Dressing Status Clean;Dry;Intact 09/20/2015  8:30 PM  Surrounding Skin Unable to view 09/20/2015  8:30 PM  Output (mL) 20 mL 09/21/2015  6:00 AM     NG/OG Tube Orogastric Center mouth (Active)  Placement Verification Auscultation 09/20/2015  8:30 PM  Site Assessment Clean;Dry 09/20/2015  8:30 PM  Status Suction-low intermittent 09/20/2015  8:30 PM  Drainage Appearance Yellow 09/20/2015  8:30 PM  Output (mL) 250 mL 09/20/2015  7:00 AM     Urethral Catheter Venia Carbon RN  (Active)  Indication for Insertion or Continuance of Catheter Chemically paralyzed patients;Unstable critical patients (first 24-48 hours) 09/20/2015  8:30 PM  Site Assessment Clean;Intact 09/20/2015  8:30 PM  Catheter Maintenance Bag below level of bladder;Catheter secured;Drainage bag/tubing not touching floor;Insertion date on drainage bag;No dependent loops;Seal intact 09/20/2015  8:30 PM  Collection Container Standard drainage bag 09/20/2015  8:30 PM  Securement Method Leg strap 09/20/2015  8:30 PM  Output (mL) 120 mL 09/21/2015  6:00 AM    Microbiology/Sepsis markers: Results for orders placed or performed during the hospital encounter of 09/18/15  MRSA PCR Screening     Status: None   Collection Time: 09/19/15  4:46 AM  Result Value Ref Range Status   MRSA by PCR NEGATIVE NEGATIVE Final    Comment:  The GeneXpert MRSA Assay (FDA approved for NASAL specimens only), is one component of a comprehensive MRSA colonization surveillance program. It is not intended to diagnose MRSA infection nor to guide or monitor treatment for MRSA infections.     Anti-infectives:  Anti-infectives    Start     Dose/Rate Route Frequency Ordered Stop   09/20/15 1700  piperacillin-tazobactam (ZOSYN) IVPB 3.375 g     3.375 g 12.5 mL/hr over 240 Minutes Intravenous Every 8  hours 09/20/15 1540        Best Practice/Protocols:  VTE Prophylaxis: Lovenox (prophylaxtic dose) Continous Sedation ARDS  Consults: Treatment Team:  Md Pccm, MD      Subjective:    Overnight Issues: More stable hemodynamically, but did have some desats.  Going down to get a chest CT.  Intraabdominal pressures were checked due to abdominal distention. These were 19 mm Hg.  Objective:  Vital signs for last 24 hours: Temp:  [98.3 F (36.8 C)-99 F (37.2 C)] 98.5 F (36.9 C) (01/08 0813) Pulse Rate:  [27-118] 118 (01/08 0908) Resp:  [19-32] 19 (01/08 0908) BP: (86-140)/(51-82) 140/65 mmHg (01/08 0908) SpO2:  [94 %-100 %] 98 % (01/08 0914) Arterial Line BP: (95-168)/(45-75) 128/64 mmHg (01/08 0830) FiO2 (%):  [40 %] 40 % (01/08 0914) Weight:  [83 kg (182 lb 15.7 oz)] 83 kg (182 lb 15.7 oz) (01/08 0530)  Hemodynamic parameters for last 24 hours: CVP:  [13 mmHg-22 mmHg] 22 mmHg  Intake/Output from previous day: 01/07 0701 - 01/08 0700 In: 4953.3 [I.V.:4003.3; NG/GT:800; IV Piggyback:150] Out: [id number] [Urine:2260; Emesis/NG output:1230; Chest Tube:160]  Intake/Output this shift: Total I/O In: 192.9 [I.V.:142.9; IV Piggyback:50] Out: 178 [Urine:175; Stool:3]  Vent settings for last 24 hours: Vent Mode:  [-] PRVC FiO2 (%):  [40 %] 40 % Set Rate:  [18 bmp-28 bmp] 18 bmp Vt Set:  [560 mL] 560 mL PEEP:  [5 cmH20] 5 cmH20 Plateau Pressure:  [22 [id number] cmH20] 22 cmH20  Physical Exam:  General: intubated sedated  Neuro: off paralytics Resp: wheezes bilateral  CVS: sinus tachycardia  GI: soft  but distended,  easily reducible umbilical hernia   Results for orders placed or performed during the hospital encounter of 09/18/15 (from the past 24 hour(s))  Troponin I     Status: Abnormal   Collection Time: 09/21/15 11:33 AM  Result Value Ref Range   Troponin I 0.09 (H) <0.031 ng/mL  I-STAT 3, arterial blood gas (G3+)     Status: Abnormal   Collection Time: 09/21/15  3:52  PM  Result Value Ref Range   pH, Arterial 7.433 7.350 - 7.450   pCO2 arterial 35.0 35.0 - 45.0 mmHg   pO2, Arterial 118.0 (H) 80.0 - 100.0 mmHg   Bicarbonate 23.4 20.0 - 24.0 mEq/L   TCO2 24 0 - 100 mmol/L   O2 Saturation 99.0 %   Acid-base deficit 1.0 0.0 - 2.0 mmol/L   Patient temperature 98.4 F    Collection site FEMORAL ARTERY    Sample type ARTERIAL   Basic metabolic panel     Status: Abnormal   Collection Time: 09/21/15  4:05 PM  Result Value Ref Range   Sodium 140 135 - 145 mmol/L   Potassium 4.1 3.5 - 5.1 mmol/L   Chloride 107 101 - 111 mmol/L   CO2 23 22 - 32 mmol/L   Glucose, Bld 108 (H) 65 - 99 mg/dL   BUN 18 6 - 20 mg/dL   Creatinine, Ser 1.11 0.61 - 1.24  mg/dL   Calcium 7.2 (L) 8.9 - 10.3 mg/dL   GFR calc non Af Amer >60 >60 mL/min   GFR calc Af Amer >60 >60 mL/min   Anion gap 10 5 - 15  CBC     Status: Abnormal   Collection Time: 09/21/15  4:05 PM  Result Value Ref Range   WBC 11.0 (H) 4.0 - 10.5 K/uL   RBC 2.32 (L) 4.22 - 5.81 MIL/uL   Hemoglobin 7.3 (L) 13.0 - 17.0 g/dL   HCT 22.1 (L) 39.0 - 52.0 %   MCV 95.3 78.0 - 100.0 fL   MCH 31.5 26.0 - 34.0 pg   MCHC 33.0 30.0 - 36.0 g/dL   RDW 14.2 11.5 - 15.5 %   Platelets 112 (L) 150 - 400 K/uL  CBC with Differential/Platelet     Status: Abnormal   Collection Time: 09/22/15  3:45 AM  Result Value Ref Range   WBC 11.7 (H) 4.0 - 10.5 K/uL   RBC 2.22 (L) 4.22 - 5.81 MIL/uL   Hemoglobin 7.0 (L) 13.0 - 17.0 g/dL   HCT 21.1 (L) 39.0 - 52.0 %   MCV 95.0 78.0 - 100.0 fL   MCH 31.5 26.0 - 34.0 pg   MCHC 33.2 30.0 - 36.0 g/dL   RDW 14.3 11.5 - 15.5 %   Platelets 120 (L) 150 - 400 K/uL   Neutrophils Relative % 87 %   Lymphocytes Relative 7 %   Monocytes Relative 4 %   Eosinophils Relative 2 %   Basophils Relative 0 %   Neutro Abs 10.2 (H) 1.7 - 7.7 K/uL   Lymphs Abs 0.8 0.7 - 4.0 K/uL   Monocytes Absolute 0.5 0.1 - 1.0 K/uL   Eosinophils Absolute 0.2 0.0 - 0.7 K/uL   Basophils Absolute 0.0 0.0 - 0.1 K/uL    RBC Morphology TARGET CELLS    WBC Morphology INCREASED BANDS (>20% BANDS)   Basic metabolic panel     Status: Abnormal   Collection Time: 09/22/15  3:45 AM  Result Value Ref Range   Sodium 139 135 - 145 mmol/L   Potassium 3.8 3.5 - 5.1 mmol/L   Chloride 107 101 - 111 mmol/L   CO2 25 22 - 32 mmol/L   Glucose, Bld 139 (H) 65 - 99 mg/dL   BUN 12 6 - 20 mg/dL   Creatinine, Ser 0.98 0.61 - 1.24 mg/dL   Calcium 7.3 (L) 8.9 - 10.3 mg/dL   GFR calc non Af Amer >60 >60 mL/min   GFR calc Af Amer >60 >60 mL/min   Anion gap 7 5 - 15  Procalcitonin     Status: None   Collection Time: 09/22/15  3:45 AM  Result Value Ref Range   Procalcitonin 15.18 ng/mL  I-STAT 3, arterial blood gas (G3+)     Status: Abnormal   Collection Time: 09/22/15  3:46 AM  Result Value Ref Range   pH, Arterial 7.476 (H) 7.350 - 7.450   pCO2 arterial 37.0 35.0 - 45.0 mmHg   pO2, Arterial 125.0 (H) 80.0 - 100.0 mmHg   Bicarbonate 27.3 (H) 20.0 - 24.0 mEq/L   TCO2 28 0 - 100 mmol/L   O2 Saturation 99.0 %   Acid-Base Excess 4.0 (H) 0.0 - 2.0 mmol/L   Patient temperature 99.0 F    Collection site ARTERIAL LINE    Drawn by Nurse    Sample type ARTERIAL      Assessment/Plan:    NEURO  Altered Mental Status: Sedated.  Plan: wake up assessment when safe.   PULM  ? Aspiration pneumonia on top of COPD.     Plan: PCCM getting CTA to r/o PE.     CARDIO  multifactoral hypotension.     Plan:improved on pressors, RN weaning levophed today.  RENAL  Hematuria and output is good.   Plan: CPM  GI  Ileus   Plan: NPO, NGT.  Continue bowel rest.  Will likely need to start TNA.  ID  Presumed PNA.  On Zosyn.     Plan: continue current care.     HEME  Anemia acute blood loss anemia)   Plan: consider transfusion if pressor requirement continues.    ENDO No known issues   Plan: CPM  Global Issues            LOS: 4 days   Additional comments:I have reviewed imaging and labs.    Critical Care  Total Time*: 30 Minutes  Penne Rosenstock 09/22/2015  *Care during the described time interval was provided by me and/or other providers on the critical care team.  I have reviewed this patient's available data, including medical history, events of note, physical examination and test results as part of my evaluation.

## 2015-09-23 ENCOUNTER — Inpatient Hospital Stay (HOSPITAL_COMMUNITY): Payer: Non-veteran care

## 2015-09-23 ENCOUNTER — Encounter (HOSPITAL_COMMUNITY): Payer: Self-pay | Admitting: Anesthesiology

## 2015-09-23 LAB — CBC WITH DIFFERENTIAL/PLATELET
Basophils Absolute: 0 10*3/uL (ref 0.0–0.1)
Basophils Relative: 0 %
EOS ABS: 0.1 10*3/uL (ref 0.0–0.7)
Eosinophils Relative: 1 %
HEMATOCRIT: 24.1 % — AB (ref 39.0–52.0)
HEMOGLOBIN: 8.3 g/dL — AB (ref 13.0–17.0)
LYMPHS ABS: 1 10*3/uL (ref 0.7–4.0)
LYMPHS PCT: 9 %
MCH: 31.6 pg (ref 26.0–34.0)
MCHC: 34.4 g/dL (ref 30.0–36.0)
MCV: 91.6 fL (ref 78.0–100.0)
MONOS PCT: 6 %
Monocytes Absolute: 0.7 10*3/uL (ref 0.1–1.0)
NEUTROS ABS: 9.7 10*3/uL — AB (ref 1.7–7.7)
NEUTROS PCT: 85 %
Platelets: 102 10*3/uL — ABNORMAL LOW (ref 150–400)
RBC: 2.63 MIL/uL — AB (ref 4.22–5.81)
RDW: 15.7 % — ABNORMAL HIGH (ref 11.5–15.5)
WBC: 11.5 10*3/uL — AB (ref 4.0–10.5)

## 2015-09-23 LAB — POCT I-STAT 3, ART BLOOD GAS (G3+)
Bicarbonate: 25 mEq/L — ABNORMAL HIGH (ref 20.0–24.0)
O2 Saturation: 99 %
PCO2 ART: 39.7 mmHg (ref 35.0–45.0)
PH ART: 7.408 (ref 7.350–7.450)
PO2 ART: 122 mmHg — AB (ref 80.0–100.0)
Patient temperature: 98.7
TCO2: 26 mmol/L (ref 0–100)

## 2015-09-23 LAB — BASIC METABOLIC PANEL
Anion gap: 8 (ref 5–15)
BUN: 12 mg/dL (ref 6–20)
CHLORIDE: 109 mmol/L (ref 101–111)
CO2: 26 mmol/L (ref 22–32)
Calcium: 7.6 mg/dL — ABNORMAL LOW (ref 8.9–10.3)
Creatinine, Ser: 0.97 mg/dL (ref 0.61–1.24)
GFR calc Af Amer: >60 mL/min (ref >60)
GFR calc non Af Amer: >60 mL/min (ref >60)
Glucose, Bld: 127 mg/dL — ABNORMAL HIGH (ref 65–99)
POTASSIUM: 3.6 mmol/L (ref 3.5–5.1)
SODIUM: 143 mmol/L (ref 135–145)

## 2015-09-23 LAB — TYPE AND SCREEN
ABO/RH(D): O POS
Antibody Screen: NEGATIVE
UNIT DIVISION: 0
UNIT DIVISION: 0

## 2015-09-23 LAB — PROTIME-INR
INR: 1.45 (ref 0.00–1.49)
Prothrombin Time: 17.7 seconds — ABNORMAL HIGH (ref 11.6–15.2)

## 2015-09-23 MED ORDER — FUROSEMIDE 10 MG/ML IJ SOLN
40.0000 mg | Freq: Once | INTRAMUSCULAR | Status: AC
  Administered 2015-09-23: 40 mg via INTRAVENOUS
  Filled 2015-09-23: qty 4

## 2015-09-23 MED ORDER — PIVOT 1.5 CAL PO LIQD
1000.0000 mL | ORAL | Status: DC
  Administered 2015-09-23: 1000 mL
  Filled 2015-09-23 (×2): qty 1000

## 2015-09-23 NOTE — Progress Notes (Signed)
Pharmacy Antibiotic Follow-up Note  [name] [name] is a 65 y.o. year-old male admitted on 09/18/2015.  The patient is currently on day 4 of zosyn  for aspiration PNA. WBC= 11.5, tmax= 100.7, CrCl ~ 80  Plan: This patient's current antibiotics will be continued without adjustments.  Will sign off for now. Please call pharmacy with any other needs.   Temp (24hrs), Avg:99.2 F (37.3 C), Min:98.1 F (36.7 C), Max:100.7 F (38.2 C)   Recent Labs Lab 09/20/15 1958 09/21/15 0446 09/21/15 1605 09/22/15 0345 09/23/15 0348  WBC 4.0 7.3 11.0* 11.7* 11.5*    Recent Labs Lab 09/20/15 1958 09/21/15 0446 09/21/15 1605 09/22/15 0345 09/23/15 0348  CREATININE 2.20* 1.41* 1.11 0.98 0.97   Estimated Creatinine Clearance: 77.9 mL/min (by C-G formula based on Cr of 0.97).    No Known Allergies  Antimicrobials this admission: 1/6 zosyn   Microbiology results: 1/6 zosyn 1/8: Cdiff negative. 1/9 resp   Thank you for allowing pharmacy to be a part of this patient's care.  Dareen Piano PharmD 09/23/2015 3:51 PM

## 2015-09-23 NOTE — Progress Notes (Signed)
Pt currently sedated and on ventilator; requiring pressor support.   Not weaning well; may need tracheostomy,per MD notes.  Will continue to follow progress.    [name], RN, BSN  Trauma/Neuro ICU Case Manager [phone]

## 2015-09-23 NOTE — Progress Notes (Signed)
RT tried to wean patient without success. Patients RR became close to 60 BPM and RT had to turn full support back on and RN and to sedate him to get his breathing under control.

## 2015-09-23 NOTE — Progress Notes (Signed)
Follow up - Trauma and Critical Care  Patient Details:    [name] [name] is an 65 y.o. male.  Lines/tubes : Airway 8 mm (Active)  Secured at (cm) 23 cm 09/23/2015  8:00 AM  Measured From Lips 09/23/2015  8:00 AM  [place] 09/23/2015  8:00 AM  Secured By [employer] 09/23/2015  8:00 AM  Tube Holder Repositioned Yes 09/23/2015  8:00 AM  Cuff Pressure (cm H2O) 28 cm H2O 09/23/2015 12:05 AM  Site Condition Dry;Cool 09/23/2015  8:00 AM     CVC Triple Lumen 09/20/15 Left Subclavian (Active)  Indication for Insertion or Continuance of Line Vasoactive infusions 09/23/2015  8:00 AM  Site Assessment Clean;Dry;Intact 09/23/2015  8:00 AM  Proximal Lumen Status Infusing 09/23/2015  8:00 AM  Medial Infusing 09/23/2015  8:00 AM  Distal Lumen Status Other (Comment) 09/23/2015  8:00 AM  Dressing Type Transparent 09/23/2015  8:00 AM  Dressing Status Clean;Dry;Intact;Antimicrobial disc in place 09/23/2015  8:00 AM  Line Care Connections checked and tightened;Leveled;Zeroed and calibrated 09/23/2015  8:00 AM  Dressing Change Due 09/27/15 09/23/2015  8:00 AM     Arterial Line 09/20/15 Left Radial (Active)  Site Assessment Clean;Dry;Intact 09/23/2015  8:00 AM  Line Status Pulsatile blood flow 09/23/2015  8:00 AM  Art Line Waveform Appropriate 09/23/2015  8:00 AM  Art Line Interventions Zeroed and calibrated 09/23/2015  8:00 AM  Color/Movement/Sensation Capillary refill less than 3 sec 09/23/2015  8:00 AM  Dressing Type Transparent;Securing device 09/23/2015  8:00 AM  Dressing Status Clean;Dry;Intact 09/23/2015  8:00 AM     Epidural Catheter 09/19/15 (Active)  Site Assessment Clean;Dry;Intact 09/23/2015  8:00 AM  Line Status Capped 09/23/2015  8:00 AM  Dressing Type Transparent 09/23/2015  8:00 AM  Dressing Status Intact;Old drainage 09/23/2015  8:00 AM     Chest Tube 1 Left Pleural (Active)  Suction To water seal 09/23/2015  8:00 AM  Chest Tube Air Leak None 09/23/2015  8:00 AM  Patency Intervention Tip/tilt 09/23/2015  8:00  AM  Drainage Description Serosanguineous 09/23/2015  8:00 AM  Dressing Status Clean;Dry;Intact 09/23/2015  8:00 AM  Surrounding Skin Unable to view 09/23/2015  8:00 AM  Output (mL) 30 mL 09/23/2015  6:00 AM     NG/OG Tube Orogastric Center mouth (Active)  Placement Verification Auscultation 09/23/2015  8:00 AM  Site Assessment Clean;Dry 09/23/2015  8:00 AM  Status Suction-low intermittent;Irrigated 09/23/2015  8:00 AM  Drainage Appearance Yellow 09/23/2015  8:00 AM  Intake (mL) 30 mL 09/23/2015  4:00 AM  Output (mL) 200 mL 09/23/2015  6:00 AM     Rectal Tube/Pouch (Active)     Urethral Catheter Venia Carbon RN  (Active)  Indication for Insertion or Continuance of Catheter Unstable critical patients (first 24-48 hours) 09/23/2015  8:00 AM  Site Assessment Clean;Intact 09/23/2015  8:00 AM  Catheter Maintenance Catheter secured;Bag below level of bladder;No dependent loops;Seal intact;Bag emptied prior to transport;Drainage bag/tubing not touching floor;Insertion date on drainage bag 09/23/2015  8:00 AM  Collection Container Standard drainage bag 09/23/2015  8:00 AM  Securement Method Leg strap 09/23/2015  8:00 AM  Output (mL) 60 mL 09/23/2015  6:00 AM    Microbiology/Sepsis markers: Results for orders placed or performed during the hospital encounter of 09/18/15  MRSA PCR Screening     Status: None   Collection Time: 09/19/15  4:46 AM  Result Value Ref Range Status   MRSA by PCR NEGATIVE NEGATIVE Final    Comment:  The GeneXpert MRSA Assay (FDA approved for NASAL specimens only), is one component of a comprehensive MRSA colonization surveillance program. It is not intended to diagnose MRSA infection nor to guide or monitor treatment for MRSA infections.   C difficile quick scan w PCR reflex     Status: None   Collection Time: 09/22/15  9:15 AM  Result Value Ref Range Status   C Diff antigen NEGATIVE NEGATIVE Final   C Diff toxin NEGATIVE NEGATIVE Final   C Diff interpretation Negative for  toxigenic C. difficile  Final    Anti-infectives:  Anti-infectives    Start     Dose/Rate Route Frequency Ordered Stop   09/20/15 1700  piperacillin-tazobactam (ZOSYN) IVPB 3.375 g     3.375 g 12.5 mL/hr over 240 Minutes Intravenous Every 8 hours 09/20/15 1540        Best Practice/Protocols:  VTE Prophylaxis: Mechanical GI Prophylaxis: Proton Pump Inhibitor Continous Sedation  Consults: Treatment Team:  Md Pccm, MD    Events:  Subjective:    Overnight Issues: Patient has improved over the weekend.  Appreciate the help of CCM service.  Objective:  Vital signs for last 24 hours: Temp:  [98.5 F (36.9 C)-99.9 F (37.7 C)] 99.9 F (37.7 C) (01/09 0700) Pulse Rate:  [104-118] 107 (01/09 0700) Resp:  [15-33] 25 (01/09 0700) BP: (84-140)/(56-81) 107/78 mmHg (01/09 0700) SpO2:  [95 %-100 %] 98 % (01/09 0752) Arterial Line BP: (98-139)/(48-76) 129/71 mmHg (01/09 0700) FiO2 (%):  [40 %] 40 % (01/09 0800) Weight:  [83.4 kg (183 lb 13.8 oz)] 83.4 kg (183 lb 13.8 oz) (01/09 0600)  Hemodynamic parameters for last 24 hours: CVP:  [10 mmHg-45 mmHg] 20 mmHg  Intake/Output from previous day: 01/08 0701 - 01/09 0700 In: 3987.9 [I.V.:3392.9; Blood:355; NG/GT:90; IV Piggyback:150] Out: 1898 [Urine:1565; Emesis/NG output:300; Stool:3; Chest Tube:30]  Intake/Output this shift:    Vent settings for last 24 hours: Vent Mode:  [-] PRVC FiO2 (%):  [40 %] 40 % Set Rate:  [18 bmp] 18 bmp Vt Set:  [560 mL] 560 mL PEEP:  [5 cmH20] 5 cmH20 Plateau Pressure:  [16 cmH20-23 cmH20] 22 cmH20  Physical Exam:  General: no respiratory distress Neuro: nonfocal exam and RASS -1 Resp: wheezes bilaterally CVS: regular rate and rhythm, S1, S2 normal, no murmur, click, rub or gallop and Sinus tachycardia GI: soft, nontender, BS WNL, no r/g Extremities: edema 1+ and edema 2+  Results for orders placed or performed during the hospital encounter of 09/18/15 (from the past 24 hour(s))  C  difficile quick scan w PCR reflex     Status: None   Collection Time: 09/22/15  9:15 AM  Result Value Ref Range   C Diff antigen NEGATIVE NEGATIVE   C Diff toxin NEGATIVE NEGATIVE   C Diff interpretation Negative for toxigenic C. difficile   Type and screen [place]     Status: None (Preliminary result)   Collection Time: 09/22/15  4:00 PM  Result Value Ref Range   ABO/RH(D) O POS    Antibody Screen NEG    Sample Expiration 09/25/2015    Unit Number [id number]    Blood Component Type RED CELLS,LR    Unit division 00    Status of Unit ISSUED    Transfusion Status OK TO TRANSFUSE    Crossmatch Result Compatible    Unit Number [id number]    Blood Component Type RED CELLS,LR    Unit division 00    Status of  Unit ISSUED    Transfusion Status OK TO TRANSFUSE    Crossmatch Result Compatible   Prepare RBC     Status: None   Collection Time: 09/22/15  4:00 PM  Result Value Ref Range   Order Confirmation ORDER PROCESSED BY BLOOD BANK   I-STAT 3, arterial blood gas (G3+)     Status: Abnormal   Collection Time: 09/23/15  3:41 AM  Result Value Ref Range   pH, Arterial 7.408 7.350 - 7.450   pCO2 arterial 39.7 35.0 - 45.0 mmHg   pO2, Arterial 122.0 (H) 80.0 - 100.0 mmHg   Bicarbonate 25.0 (H) 20.0 - 24.0 mEq/L   TCO2 26 0 - 100 mmol/L   O2 Saturation 99.0 %   Patient temperature 98.7 F    Collection site ARTERIAL LINE    Drawn by Nurse    Sample type ARTERIAL   CBC with Differential/Platelet     Status: Abnormal   Collection Time: 09/23/15  3:48 AM  Result Value Ref Range   WBC 11.5 (H) 4.0 - 10.5 K/uL   RBC 2.63 (L) 4.22 - 5.81 MIL/uL   Hemoglobin 8.3 (L) 13.0 - 17.0 g/dL   HCT 24.1 (L) 39.0 - 52.0 %   MCV 91.6 78.0 - 100.0 fL   MCH 31.6 26.0 - 34.0 pg   MCHC 34.4 30.0 - 36.0 g/dL   RDW 15.7 (H) 11.5 - 15.5 %   Platelets 102 (L) 150 - 400 K/uL   Neutrophils Relative % 85 %   Neutro Abs 9.7 (H) 1.7 - 7.7 K/uL   Lymphocytes Relative 9 %   Lymphs Abs  1.0 0.7 - 4.0 K/uL   Monocytes Relative 6 %   Monocytes Absolute 0.7 0.1 - 1.0 K/uL   Eosinophils Relative 1 %   Eosinophils Absolute 0.1 0.0 - 0.7 K/uL   Basophils Relative 0 %   Basophils Absolute 0.0 0.0 - 0.1 K/uL  Basic metabolic panel     Status: Abnormal   Collection Time: 09/23/15  3:48 AM  Result Value Ref Range   Sodium 143 135 - 145 mmol/L   Potassium 3.6 3.5 - 5.1 mmol/L   Chloride 109 101 - 111 mmol/L   CO2 26 22 - 32 mmol/L   Glucose, Bld 127 (H) 65 - 99 mg/dL   BUN 12 6 - 20 mg/dL   Creatinine, Ser 0.97 0.61 - 1.24 mg/dL   Calcium 7.6 (L) 8.9 - 10.3 mg/dL   GFR calc non Af Amer >60 >60 mL/min   GFR calc Af Amer >60 >60 mL/min   Anion gap 8 5 - 15  Protime-INR     Status: Abnormal   Collection Time: 09/23/15  3:48 AM  Result Value Ref Range   Prothrombin Time 17.7 (H) 11.6 - 15.2 seconds   INR 1.45 0.00 - 1.49     Assessment/Plan:   NEURO  Altered Mental Status:  agitation and sedation   Plan: Continue sedation  PULM  Atelectasis/collapse (bibasilar) Chest Wall Trauma multiple rib fractures, Lung Trauma (left and with contusion of lung) and Pneumothorax (traumatic)   Plan: Continue present support on the ventilator.  By report attempts to switch the patient to PS this AM were met with RR up to 50's.  May end up needed a trach for ventilator support.  Pain control for irb fractures has not been adequate  CARDIO  Sinus Tachycardia   Plan: CPM  RENAL  Urine output and renal functionare normal.  Will attempt to diurese  Plan: Diuresis  GI  No signficant problem   Plan: Tube feedings  ID  No known infectious problems.   Plan: Sending tracheal aspirate for culture this AM.  HEME  Anemia acute blood loss anemia and anemia of critical illness)   Plan: No blood for now.  ENDO No known issues   Plan: CPM  Global Issues  It does not seem as though we will be able to quickly wean this patient from the ventilator.  Pain control is a key issue, and the current  epidural catheter in place has been ineffective and should be removed as soon as possible.  Still on vasopressin which can probably be discontinued    LOS: 5 days   Additional comments:I reviewed the patient's new clinical lab test results. cbc/bmet and I reviewed the patients new imaging test results. cxr  Critical Care Total Time*: 62 Minutes    09/23/2015  *Care during the described time interval was provided by me and/or other providers on the critical care team.  I have reviewed this patient's available data, including medical history, events of note, physical examination and test results as part of my evaluation.

## 2015-09-23 NOTE — Anesthesia Post-op Follow-up Note (Signed)
Patient examined. Epidural site ok. Talked to nurse. Inc PTT. Will follow up and pull when coag nl. CE Anesthesia Post Note  Patient: [name] [name]  Procedure(s) Performed: * No procedures listed *   Patient location:   Post pain: ok  Post assessment: will follow   Last Vitals:  Filed Vitals:   09/23/15 1500 09/23/15 1509  BP: 110/56   Pulse: 107   Temp:  38.2 C  Resp: 33     Post vital signs: Reviewed  Level of consciousness: awake  Complications: No apparent anesthesia complications Anesthesia Pain Follow-up Note  Patient: [name] [name]  Day #: 2  Date of Follow-up: 09/23/2015 Time: 3:57 PM  Last Vitals:  Filed Vitals:   09/23/15 1500 09/23/15 1509  BP: 110/56   Pulse: 107   Temp:  38.2 C  Resp: 33     Level of Consciousness: alert  Pain: mild   Side Effects:None  Catheter Site Exam:clean  Plan: Continue current therapy  EDWARDS,Murrell Elizondo

## 2015-09-23 NOTE — Progress Notes (Signed)
Patient examined. Epidural site ok. Case discussed with nurse. [employer]. Will follow. Continue present management. Finis Bud, [place]

## 2015-09-24 ENCOUNTER — Inpatient Hospital Stay (HOSPITAL_COMMUNITY): Payer: Non-veteran care

## 2015-09-24 DIAGNOSIS — S2249XD Multiple fractures of ribs, unspecified side, subsequent encounter for fracture with routine healing: Secondary | ICD-10-CM

## 2015-09-24 LAB — POCT I-STAT 3, ART BLOOD GAS (G3+)
ACID-BASE EXCESS: 5 mmol/L — AB (ref 0.0–2.0)
BICARBONATE: 28.9 meq/L — AB (ref 20.0–24.0)
O2 SAT: 100 %
PO2 ART: 173 mmHg — AB (ref 80.0–100.0)
Patient temperature: 101
TCO2: 30 mmol/L (ref 0–100)
pCO2 arterial: 43.5 mmHg (ref 35.0–45.0)
pH, Arterial: 7.436 (ref 7.350–7.450)

## 2015-09-24 LAB — BASIC METABOLIC PANEL
Anion gap: 7 (ref 5–15)
BUN: 11 mg/dL (ref 6–20)
CHLORIDE: 113 mmol/L — AB (ref 101–111)
CO2: 28 mmol/L (ref 22–32)
CREATININE: 0.9 mg/dL (ref 0.61–1.24)
Calcium: 7.7 mg/dL — ABNORMAL LOW (ref 8.9–10.3)
GFR calc Af Amer: >60 mL/min (ref >60)
GFR calc non Af Amer: >60 mL/min (ref >60)
GLUCOSE: 123 mg/dL — AB (ref 65–99)
Potassium: 2.9 mmol/L — ABNORMAL LOW (ref 3.5–5.1)
Sodium: 148 mmol/L — ABNORMAL HIGH (ref 135–145)

## 2015-09-24 LAB — CBC WITH DIFFERENTIAL/PLATELET
Basophils Absolute: 0 10*3/uL (ref 0.0–0.1)
Basophils Relative: 0 %
EOS ABS: 0.1 10*3/uL (ref 0.0–0.7)
Eosinophils Relative: 1 %
HEMATOCRIT: 24.8 % — AB (ref 39.0–52.0)
HEMOGLOBIN: 8.3 g/dL — AB (ref 13.0–17.0)
LYMPHS ABS: 1.1 10*3/uL (ref 0.7–4.0)
Lymphocytes Relative: 14 %
MCH: 31.2 pg (ref 26.0–34.0)
MCHC: 33.5 g/dL (ref 30.0–36.0)
MCV: 93.2 fL (ref 78.0–100.0)
MONOS PCT: 10 %
Monocytes Absolute: 0.7 10*3/uL (ref 0.1–1.0)
NEUTROS ABS: 5.7 10*3/uL (ref 1.7–7.7)
NEUTROS PCT: 75 %
Platelets: 114 10*3/uL — ABNORMAL LOW (ref 150–400)
RBC: 2.66 MIL/uL — ABNORMAL LOW (ref 4.22–5.81)
RDW: 15.5 % (ref 11.5–15.5)
WBC: 7.6 10*3/uL (ref 4.0–10.5)

## 2015-09-24 LAB — GLUCOSE, CAPILLARY
Glucose-Capillary: 107 mg/dL — ABNORMAL HIGH (ref 65–99)
Glucose-Capillary: 106 mg/dL — ABNORMAL HIGH (ref 65–99)
Glucose-Capillary: 118 mg/dL — ABNORMAL HIGH (ref 65–99)

## 2015-09-24 LAB — PROTIME-INR
INR: 1.35 (ref 0.00–1.49)
Prothrombin Time: 16.8 seconds — ABNORMAL HIGH (ref 11.6–15.2)

## 2015-09-24 MED ORDER — PIVOT 1.5 CAL PO LIQD
1000.0000 mL | ORAL | Status: DC
  Administered 2015-09-27 – 2015-09-28 (×3): 1000 mL
  Filled 2015-09-24: qty 1000

## 2015-09-24 MED ORDER — PIVOT 1.5 CAL PO LIQD: 1000.0000 mL | ORAL | Status: DC

## 2015-09-24 MED ORDER — PANTOPRAZOLE SODIUM 40 MG PO PACK
40.0000 mg | PACK | Freq: Every day | ORAL | Status: DC
  Administered 2015-09-24 – 2015-11-04 (×42): 40 mg
  Filled 2015-09-24 (×43): qty 20

## 2015-09-24 MED ORDER — VITAL HIGH PROTEIN PO LIQD: 1000.0000 mL | ORAL | Status: DC

## 2015-09-24 MED ORDER — POTASSIUM CHLORIDE 20 MEQ/15ML (10%) PO SOLN
60.0000 meq | Freq: Once | ORAL | Status: AC
  Administered 2015-09-24: 60 meq via ORAL
  Filled 2015-09-24: qty 45

## 2015-09-24 NOTE — Anesthesia Postprocedure Evaluation (Signed)
Anesthesia Post Note  Patient: [name] [name]  Procedure(s) Performed: * No procedures listed *  Patient location during evaluation: ICU Anesthesia Type: Epidural Level of consciousness: sedated, lethargic and patient cooperative Pain management: pain level controlled Respiratory status: spontaneous breathing and patient on ventilator - see flowsheet for VS Cardiovascular status: stable Anesthetic complications: no Comments: Coag results noted. Epidural catheter d/c'd tip intact.    Last Vitals:  Filed Vitals:   09/24/15 1200 09/24/15 1207  BP: 122/83   Pulse: 112   Temp:  37.8 C  Resp: 24     Last Pain:  Filed Vitals:   09/24/15 1207  PainSc: Asleep                 Mackinley Cassaday A.
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
## 2015-09-24 NOTE — Anesthesia Post-op Follow-up Note (Signed)
  Anesthesia Pain Follow-up Note  Patient: [name] [name]  Date of Follow-up: 09/24/2015 Time: 9:15 AM  Last Vitals:  Filed Vitals:   09/24/15 0749 09/24/15 0800  BP: 120/88 108/79  Pulse: 108 109  Temp:    Resp: 24 25    Level of Consciousness: sedated  Pain: controlled  Side Effects:None  Catheter Site Exam:clean, dry, no drainage  Plan: Continue current therapy . Will recheck PT this am and if normal will D/C epidural catheter  Bo Teicher A.

## 2015-09-24 NOTE — Plan of Care (Signed)
Problem: Pain Managment: Goal: General experience of comfort will improve Outcome: Progressing Patient was on Fentanyl 100 mcg continuous at the beginning of my shift, increased up to 300 mcg.

## 2015-09-24 NOTE — Anesthesia Postprocedure Evaluation (Signed)
Anesthesia Post Note  Patient: [name] [name]  Procedure(s) Performed: * No procedures listed *  Patient location during evaluation: ICU Anesthesia Type: General Level of consciousness: patient remains intubated per anesthesia plan Vital Signs Assessment: post-procedure vital signs reviewed and stable Respiratory status: patient remains intubated per anesthesia plan Cardiovascular status: stable Anesthetic complications: no    Last Vitals: There were no vitals filed for this visit.  Last Pain: There were no vitals filed for this visit.               Effie Berkshire
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
## 2015-09-24 NOTE — Progress Notes (Signed)
RT found patients ETT 21 @ lip. RT advanced patients tube back to 23 @ lip. Patient tolerated well. RT will continue to monitor.

## 2015-09-24 NOTE — Clinical Social Work Note (Signed)
CSW received consult to assess patient for substance abuse. CSW notes that patient is currently intubated. CSW will followup at an appropriate time.   Liz Beach MSW, [place], [place], [phone]

## 2015-09-25 ENCOUNTER — Inpatient Hospital Stay (HOSPITAL_COMMUNITY): Payer: Non-veteran care

## 2015-09-25 DIAGNOSIS — S2249XD Multiple fractures of ribs, unspecified side, subsequent encounter for fracture with routine healing: Secondary | ICD-10-CM

## 2015-09-25 DIAGNOSIS — J9601 Acute respiratory failure with hypoxia: Secondary | ICD-10-CM

## 2015-09-25 LAB — CBC
HCT: 27.1 % — ABNORMAL LOW (ref 39.0–52.0)
Hemoglobin: 8.6 g/dL — ABNORMAL LOW (ref 13.0–17.0)
MCH: 30.5 pg (ref 26.0–34.0)
MCHC: 31.7 g/dL (ref 30.0–36.0)
MCV: 96.1 fL (ref 78.0–100.0)
PLATELETS: 151 10*3/uL (ref 150–400)
RBC: 2.82 MIL/uL — ABNORMAL LOW (ref 4.22–5.81)
RDW: 15.4 % (ref 11.5–15.5)
WBC: 7.2 10*3/uL (ref 4.0–10.5)

## 2015-09-25 LAB — GLUCOSE, CAPILLARY
GLUCOSE-CAPILLARY: 135 mg/dL — AB (ref 65–99)
GLUCOSE-CAPILLARY: 128 mg/dL — AB (ref 65–99)
GLUCOSE-CAPILLARY: 110 mg/dL — AB (ref 65–99)
Glucose-Capillary: 110 mg/dL — ABNORMAL HIGH (ref 65–99)
Glucose-Capillary: 113 mg/dL — ABNORMAL HIGH (ref 65–99)
Glucose-Capillary: 128 mg/dL — ABNORMAL HIGH (ref 65–99)

## 2015-09-25 LAB — BASIC METABOLIC PANEL
ANION GAP: 7 (ref 5–15)
Anion gap: 6 (ref 5–15)
BUN: 14 mg/dL (ref 6–20)
BUN: 14 mg/dL (ref 6–20)
CALCIUM: 7.7 mg/dL — AB (ref 8.9–10.3)
CHLORIDE: 114 mmol/L — AB (ref 101–111)
CHLORIDE: 113 mmol/L — AB (ref 101–111)
CO2: 30 mmol/L (ref 22–32)
CO2: 30 mmol/L (ref 22–32)
CREATININE: 0.81 mg/dL (ref 0.61–1.24)
Calcium: 7.8 mg/dL — ABNORMAL LOW (ref 8.9–10.3)
Creatinine, Ser: 0.8 mg/dL (ref 0.61–1.24)
GFR calc Af Amer: >60 mL/min (ref >60)
GFR calc Af Amer: >60 mL/min (ref >60)
GFR calc non Af Amer: >60 mL/min (ref >60)
GLUCOSE: 135 mg/dL — AB (ref 65–99)
Glucose, Bld: 149 mg/dL — ABNORMAL HIGH (ref 65–99)
POTASSIUM: 3.7 mmol/L (ref 3.5–5.1)
Potassium: 3.6 mmol/L (ref 3.5–5.1)
SODIUM: 150 mmol/L — AB (ref 135–145)
Sodium: 150 mmol/L — ABNORMAL HIGH (ref 135–145)

## 2015-09-25 LAB — CULTURE, RESPIRATORY W GRAM STAIN
Culture: NORMAL
Special Requests: NORMAL

## 2015-09-25 LAB — CULTURE, RESPIRATORY

## 2015-09-25 MED ORDER — FREE WATER
300.0000 mL | Freq: Four times a day (QID) | Status: DC
  Administered 2015-09-25 – 2015-09-26 (×5): 300 mL
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
## 2015-09-26 ENCOUNTER — Inpatient Hospital Stay (HOSPITAL_COMMUNITY): Payer: Non-veteran care

## 2015-09-26 LAB — CBC
HEMATOCRIT: 26.2 % — AB (ref 39.0–52.0)
HEMOGLOBIN: 8.3 g/dL — AB (ref 13.0–17.0)
MCH: 31.4 pg (ref 26.0–34.0)
MCHC: 31.7 g/dL (ref 30.0–36.0)
MCV: 99.2 fL (ref 78.0–100.0)
Platelets: 203 10*3/uL (ref 150–400)
RBC: 2.64 MIL/uL — ABNORMAL LOW (ref 4.22–5.81)
RDW: 15.4 % (ref 11.5–15.5)
WBC: 9.8 10*3/uL (ref 4.0–10.5)

## 2015-09-26 LAB — GLUCOSE, CAPILLARY
GLUCOSE-CAPILLARY: 116 mg/dL — AB (ref 65–99)
GLUCOSE-CAPILLARY: 139 mg/dL — AB (ref 65–99)
GLUCOSE-CAPILLARY: 134 mg/dL — AB (ref 65–99)
Glucose-Capillary: 109 mg/dL — ABNORMAL HIGH (ref 65–99)
Glucose-Capillary: 115 mg/dL — ABNORMAL HIGH (ref 65–99)
Glucose-Capillary: 136 mg/dL — ABNORMAL HIGH (ref 65–99)

## 2015-09-26 LAB — BASIC METABOLIC PANEL
ANION GAP: 6 (ref 5–15)
BUN: 16 mg/dL (ref 6–20)
CHLORIDE: 114 mmol/L — AB (ref 101–111)
CO2: 32 mmol/L (ref 22–32)
Calcium: 7.7 mg/dL — ABNORMAL LOW (ref 8.9–10.3)
Creatinine, Ser: 0.72 mg/dL (ref 0.61–1.24)
GFR calc Af Amer: >60 mL/min (ref >60)
GLUCOSE: 134 mg/dL — AB (ref 65–99)
POTASSIUM: 3.7 mmol/L (ref 3.5–5.1)
Sodium: 152 mmol/L — ABNORMAL HIGH (ref 135–145)

## 2015-09-26 MED ORDER — FREE WATER
400.0000 mL | Status: DC
  Administered 2015-09-26 – 2015-09-28 (×9): 400 mL

## 2015-09-26 MED ORDER — DEXMEDETOMIDINE HCL IN NACL 400 MCG/100ML IV SOLN
0.4000 ug/kg/h | INTRAVENOUS | Status: DC
  Administered 2015-09-26 (×2): 0.8 ug/kg/h via INTRAVENOUS
  Administered 2015-09-26: 0.6 ug/kg/h via INTRAVENOUS
  Administered 2015-09-26: 1.2 ug/kg/h via INTRAVENOUS
  Administered 2015-09-26: 0.4 ug/kg/h via INTRAVENOUS
  Administered 2015-09-27: 2.2 ug/kg/h via INTRAVENOUS
  Administered 2015-09-27 (×2): 1.2 ug/kg/h via INTRAVENOUS
  Administered 2015-09-27: 2.2 ug/kg/h via INTRAVENOUS
  Administered 2015-09-27: 2.5 ug/kg/h via INTRAVENOUS
  Administered 2015-09-27: 1.2 ug/kg/h via INTRAVENOUS
  Administered 2015-09-27 (×2): 2.2 ug/kg/h via INTRAVENOUS
  Administered 2015-09-28 (×4): 2.5 ug/kg/h via INTRAVENOUS
  Filled 2015-09-26: qty 1000
  Filled 2015-09-26: qty 50
  Filled 2015-09-26 (×5): qty 100
  Filled 2015-09-26 (×2): qty 50
  Filled 2015-09-26 (×8): qty 100

## 2015-09-26 MED ORDER — ENOXAPARIN SODIUM 40 MG/0.4ML ~~LOC~~ SOLN
40.0000 mg | SUBCUTANEOUS | Status: DC
  Administered 2015-09-26 – 2015-10-14 (×19): 40 mg via SUBCUTANEOUS
  Filled 2015-09-26 (×20): qty 0.4

## 2015-09-26 MED ORDER — KCL IN DEXTROSE-NACL 20-5-0.2 MEQ/L-%-% IV SOLN
INTRAVENOUS | Status: DC
  Administered 2015-09-26: 12:00:00 via INTRAVENOUS
  Administered 2015-09-27: 100 mL/h via INTRAVENOUS
  Administered 2015-09-27: via INTRAVENOUS
  Administered 2015-09-27: 100 mL/h via INTRAVENOUS
  Filled 2015-09-26 (×14): qty 1000
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
## 2015-09-27 ENCOUNTER — Inpatient Hospital Stay (HOSPITAL_COMMUNITY): Payer: Non-veteran care

## 2015-09-27 LAB — CBC
HCT: 26.7 % — ABNORMAL LOW (ref 39.0–52.0)
Hemoglobin: 8.3 g/dL — ABNORMAL LOW (ref 13.0–17.0)
MCH: 30.5 pg (ref 26.0–34.0)
MCHC: 31.1 g/dL (ref 30.0–36.0)
MCV: 98.2 fL (ref 78.0–100.0)
Platelets: 265 10*3/uL (ref 150–400)
RBC: 2.72 MIL/uL — ABNORMAL LOW (ref 4.22–5.81)
RDW: 15.3 % (ref 11.5–15.5)
WBC: 9.6 10*3/uL (ref 4.0–10.5)

## 2015-09-27 LAB — GLUCOSE, CAPILLARY
Glucose-Capillary: 129 mg/dL — ABNORMAL HIGH (ref 65–99)
Glucose-Capillary: 133 mg/dL — ABNORMAL HIGH (ref 65–99)
Glucose-Capillary: 146 mg/dL — ABNORMAL HIGH (ref 65–99)
Glucose-Capillary: 149 mg/dL — ABNORMAL HIGH (ref 65–99)
Glucose-Capillary: 151 mg/dL — ABNORMAL HIGH (ref 65–99)
Glucose-Capillary: 154 mg/dL — ABNORMAL HIGH (ref 65–99)

## 2015-09-27 LAB — BASIC METABOLIC PANEL
Anion gap: 5 (ref 5–15)
BUN: 15 mg/dL (ref 6–20)
CALCIUM: 7.8 mg/dL — AB (ref 8.9–10.3)
CO2: 30 mmol/L (ref 22–32)
Chloride: 112 mmol/L — ABNORMAL HIGH (ref 101–111)
Creatinine, Ser: 0.73 mg/dL (ref 0.61–1.24)
GFR calc Af Amer: >60 mL/min (ref >60)
GLUCOSE: 175 mg/dL — AB (ref 65–99)
POTASSIUM: 3.8 mmol/L (ref 3.5–5.1)
Sodium: 147 mmol/L — ABNORMAL HIGH (ref 135–145)

## 2015-09-27 NOTE — Progress Notes (Signed)
Trauma Service Note  Subjective: Patient currently on ventilator weaning mode.  Very agitated with RR high 30's, Sats are okay.    Objective: Vital signs in last 24 hours: Temp:  [98.5 F (36.9 C)-101.4 F (38.6 C)] 98.5 F (36.9 C) (01/13 0700) Pulse Rate:  [40-112] 92 (01/13 0857) Resp:  [18-30] 30 (01/13 0857) BP: (94-120)/(59-75) 107/68 mmHg (01/13 0857) SpO2:  [92 %-100 %] 100 % (01/13 0857) FiO2 (%):  [30 %-40 %] 40 % (01/13 0857) Weight:  [88.1 kg (194 lb 3.6 oz)] 88.1 kg (194 lb 3.6 oz) (01/13 0500) Last BM Date: 09/26/15  Intake/Output from previous day: 01/12 0701 - 01/13 0700 In: 5929.4 [I.V.:3539.4; NG/GT:2290; IV Piggyback:100] Out: [id number] [Urine:1445; Chest Tube:200] Intake/Output this shift:    General: Agitated and seems uncomfortable.  On Precedex and fentanyl.  Lungs: Wheezing bilaterally, L>R..  Sats are okay.  Pulmonary cultures were negative for pathological bacteria.  Abd: Firm, mildly distended, hypoactive bowel sounds.  Had two bowel movements yesterday.  Extremities: No changes.  Neuro: Sedated, not paralyzed.  Lab Results: CBC   Recent Labs  09/26/15 0455 09/27/15 0340  WBC 9.8 9.6  HGB 8.3* 8.3*  HCT 26.2* 26.7*  PLT 203 265   BMET  Recent Labs  09/26/15 0455 09/27/15 0340  NA 152* 147*  K 3.7 3.8  CL 114* 112*  CO2 32 30  GLUCOSE 134* 175*  BUN 16 15  CREATININE 0.72 0.73  CALCIUM 7.7* 7.8*   PT/INR  Recent Labs  09/24/15 1040  LABPROT 16.8*  INR 1.35   ABG No results for input(s): PHART, HCO3 in the last 72 hours.  Invalid input(s): PCO2, PO2  Studies/Results: Dg Chest Port 1 View  09/27/2015  CLINICAL DATA:  Ventilator dependent respiratory failure.  COPD. EXAM: PORTABLE CHEST 1 VIEW COMPARISON:  09/25/2005 FINDINGS: Left chest tube remains in place without appreciable pneumothorax. Left lateral rib deformities noted. The remainder of the support apparatus appear stable. Slightly improved aeration at the left  lung base with stable airspace opacity at the right lung base. Low lung volumes. IMPRESSION: 1. Mildly improved aeration at the left lung base with stable airspace opacity at the right lung base. 2. Stable support apparatus. I do not currently see a definite pneumothorax. Left rib deformities compatible with fractures. Electronically Signed   By: Van Clines M.D.   On: 09/27/2015 08:03   Dg Chest Port 1 View  09/26/2015  CLINICAL DATA:  Ventilator dependent respiratory failure EXAM: PORTABLE CHEST 1 VIEW COMPARISON:  Portable chest x-ray of July 25, 2016 FINDINGS: The lungs are adequately inflated. A faint pleural line is noted at the left pulmonary apex likely reflecting a less than 5% pneumothorax. The left-sided chest tube tip is stable with its tip overlying the upper aspect of the aortic arch. Persistent increased density in the right lung base is present and has become more conspicuous. Subtle increased density in the retrocardiac region on the left has also developed. There is a small left pleural effusion. The cardiac silhouette is top-normal in size. The pulmonary vascularity is not engorged. The endotracheal tube tip lies 2.5 cm above the carina. The esophagogastric tube tip projects below the inferior margin of the image. The left subclavian venous catheter tip projects over the midportion of the SVC. Known left-sided rib fractures are not well demonstrated on today's study. IMPRESSION: 1. Tiny less than 5% left apical pneumothorax is suspected. The left-sided chest tube is in stable position. 2. Slight interval worsening of  bibasilar infiltrates most likely reflecting pneumonia. The endotracheal tube, esophagogastric tube and left subclavian venous catheter are in reasonable position. Electronically Signed   By: David  Martinique M.D.   On: 09/26/2015 07:33    Anti-infectives: Anti-infectives    Start     Dose/Rate Route Frequency Ordered Stop   09/20/15 1700  piperacillin-tazobactam  (ZOSYN) IVPB 3.375 g     3.375 g 12.5 mL/hr over 240 Minutes Intravenous Every 8 hours 09/20/15 1540 09/26/15 2117      Assessment/Plan: s/p  Keep sedated.  Patient will not tolerate extubation and will need tracheostomy to wean from the ventilator.  Will look to next week for trach and probably PEG for nutritional support.  LOS: 9 days   Kathryne Eriksson. Dahlia Bailiff, MD, FACS [phone] Trauma Surgeon 09/27/2015
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
## 2015-09-27 NOTE — Clinical Social Work Note (Signed)
Patient remains inappropriate for CSW assessment at this time re: substance use. Report left for weekend.  Liz Beach MSW, [place], [place], [phone]

## 2015-09-27 NOTE — Progress Notes (Signed)
Oral airway inserted due to patient biting ETT.

## 2015-09-27 NOTE — Progress Notes (Signed)
Updated [name] [name], transfer coordinator at [facility] of pt's condition.  [facility] aware of pt admission, and will call this case manager when updates are needed.    [name], RN, BSN  Trauma/Neuro ICU Case Manager [phone]

## 2015-09-28 ENCOUNTER — Inpatient Hospital Stay (HOSPITAL_COMMUNITY): Payer: Non-veteran care

## 2015-09-28 LAB — GLUCOSE, CAPILLARY
GLUCOSE-CAPILLARY: 120 mg/dL — AB (ref 65–99)
GLUCOSE-CAPILLARY: 123 mg/dL — AB (ref 65–99)
GLUCOSE-CAPILLARY: 119 mg/dL — AB (ref 65–99)
Glucose-Capillary: 121 mg/dL — ABNORMAL HIGH (ref 65–99)
Glucose-Capillary: 144 mg/dL — ABNORMAL HIGH (ref 65–99)
Glucose-Capillary: 162 mg/dL — ABNORMAL HIGH (ref 65–99)

## 2015-09-28 LAB — CBC WITH DIFFERENTIAL/PLATELET
BASOS ABS: 0 10*3/uL (ref 0.0–0.1)
Basophils Relative: 0 %
EOS PCT: 4 %
Eosinophils Absolute: 0.4 10*3/uL (ref 0.0–0.7)
HCT: 24.2 % — ABNORMAL LOW (ref 39.0–52.0)
Hemoglobin: 7.8 g/dL — ABNORMAL LOW (ref 13.0–17.0)
LYMPHS ABS: 1.5 10*3/uL (ref 0.7–4.0)
Lymphocytes Relative: 17 %
MCH: 31.8 pg (ref 26.0–34.0)
MCHC: 32.2 g/dL (ref 30.0–36.0)
MCV: 98.8 fL (ref 78.0–100.0)
MONO ABS: 0.5 10*3/uL (ref 0.1–1.0)
Monocytes Relative: 5 %
Neutro Abs: 6.7 10*3/uL (ref 1.7–7.7)
Neutrophils Relative %: 74 %
PLATELETS: 295 10*3/uL (ref 150–400)
RBC: 2.45 MIL/uL — AB (ref 4.22–5.81)
RDW: 14.6 % (ref 11.5–15.5)
WBC: 9.1 10*3/uL (ref 4.0–10.5)

## 2015-09-28 LAB — BASIC METABOLIC PANEL
ANION GAP: 4 — AB (ref 5–15)
BUN: 10 mg/dL (ref 6–20)
CALCIUM: 7.2 mg/dL — AB (ref 8.9–10.3)
CO2: 27 mmol/L (ref 22–32)
Chloride: 114 mmol/L — ABNORMAL HIGH (ref 101–111)
Creatinine, Ser: 0.57 mg/dL — ABNORMAL LOW (ref 0.61–1.24)
GFR calc Af Amer: >60 mL/min (ref >60)
GLUCOSE: 134 mg/dL — AB (ref 65–99)
Potassium: 3.7 mmol/L (ref 3.5–5.1)
Sodium: 145 mmol/L (ref 135–145)

## 2015-09-28 LAB — TRIGLYCERIDES: Triglycerides: 103 mg/dL (ref <150)

## 2015-09-28 MED ORDER — FREE WATER
200.0000 mL | Status: DC
  Administered 2015-09-28: 200 mL

## 2015-09-28 MED ORDER — FREE WATER
300.0000 mL | Freq: Four times a day (QID) | Status: DC
  Administered 2015-09-28 – 2015-10-02 (×14): 300 mL

## 2015-09-28 MED ORDER — ONDANSETRON HCL 4 MG/2ML IJ SOLN
4.0000 mg | Freq: Three times a day (TID) | INTRAMUSCULAR | Status: DC | PRN
  Administered 2015-09-28: 4 mg via INTRAVENOUS
  Filled 2015-09-28 (×2): qty 2

## 2015-09-28 MED ORDER — PROPOFOL 1000 MG/100ML IV EMUL
5.0000 ug/kg/min | INTRAVENOUS | Status: DC
  Administered 2015-09-28 (×2): 50 ug/kg/min via INTRAVENOUS
  Administered 2015-09-28: 20 ug/kg/min via INTRAVENOUS
  Administered 2015-09-29 – 2015-10-01 (×23): 80 ug/kg/min via INTRAVENOUS
  Administered 2015-10-02: 40 ug/kg/min via INTRAVENOUS
  Administered 2015-10-02: 50 ug/kg/min via INTRAVENOUS
  Administered 2015-10-02: 80 ug/kg/min via INTRAVENOUS
  Administered 2015-10-02: 20 ug/kg/min via INTRAVENOUS
  Administered 2015-10-02 (×2): 80 ug/kg/min via INTRAVENOUS
  Administered 2015-10-02 – 2015-10-04 (×10): 40 ug/kg/min via INTRAVENOUS
  Administered 2015-10-05: 20 ug/kg/min via INTRAVENOUS
  Administered 2015-10-05 (×2): 40 ug/kg/min via INTRAVENOUS
  Filled 2015-09-28 (×54): qty 100

## 2015-09-28 NOTE — Progress Notes (Addendum)
Called CCM, spoke to Dr Deteringer to inform of vomiting episode with turning and then bradycardia. MD will return call d/t emergent situation during our phone call. Pt is stable at this time. TF remains off, HOB maintained elevated. Carling.Mills MD returned call with orders received

## 2015-09-28 NOTE — Progress Notes (Signed)
[place] Progress Note Patient Name: [name] [name] DOB: 1951/06/03 MRN: [id number]   Date of Service  09/28/2015  HPI/Events of Note  Patient with episodes of vomiting coinciding with the 400 cc boluses of FW.  This precipitates bradycardia.  eICU Interventions  Plan: Change freq of TFs to reduce volume loading Zofran prn     Intervention Category Minor Interventions: Routine modifications to care plan (e.g. PRN medications for pain, fever)  Acie Custis 09/28/2015, 2:15 AM
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
## 2015-09-28 NOTE — Progress Notes (Addendum)
Vent alarming, pt awake, resisting vent and agitation noted, bradycardia on monitor at 29. Pt suctioned, Fentanyl bolus given-- recovery of bradycardia. Will continue to monitor. First bradycardia noted tonight not related to turning/repositioning.

## 2015-09-28 NOTE — Progress Notes (Signed)
Pt not able to participate in assessment.  Bernita Raisin, [place] [facility] [phone]

## 2015-09-29 LAB — GLUCOSE, CAPILLARY
GLUCOSE-CAPILLARY: 133 mg/dL — AB (ref 65–99)
GLUCOSE-CAPILLARY: 82 mg/dL (ref 65–99)
Glucose-Capillary: 117 mg/dL — ABNORMAL HIGH (ref 65–99)
Glucose-Capillary: 105 mg/dL — ABNORMAL HIGH (ref 65–99)
Glucose-Capillary: 109 mg/dL — ABNORMAL HIGH (ref 65–99)
Glucose-Capillary: 92 mg/dL (ref 65–99)

## 2015-09-29 LAB — BASIC METABOLIC PANEL
ANION GAP: 6 (ref 5–15)
BUN: 12 mg/dL (ref 6–20)
CALCIUM: 8.2 mg/dL — AB (ref 8.9–10.3)
CO2: 30 mmol/L (ref 22–32)
Chloride: 105 mmol/L (ref 101–111)
Creatinine, Ser: 0.61 mg/dL (ref 0.61–1.24)
GFR calc Af Amer: >60 mL/min (ref >60)
GLUCOSE: 154 mg/dL — AB (ref 65–99)
Potassium: 4.3 mmol/L (ref 3.5–5.1)
Sodium: 141 mmol/L (ref 135–145)
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
## 2015-09-29 NOTE — Progress Notes (Signed)
[facility] Progress Note Patient Name: [name] [name] DOB: 14-Mar-1951 MRN: [id number]   Date of Service  09/29/2015  HPI/Events of Note  Request to renew order for restraints.  eICU Interventions  Order for restraints renewed.      Intervention Category Minor Interventions: Agitation / anxiety - evaluation and management  Labib Cwynar Eugene 09/29/2015, 9:10 PM

## 2015-09-29 NOTE — Progress Notes (Signed)
Trauma Service Note  Subjective: TF coming out mouth overnight. No BM since Friday. Requiring additional medications for sedation  Objective: Vital signs in last 24 hours: Temp:  [98.1 F (36.7 C)-98.7 F (37.1 C)] 98.2 F (36.8 C) (01/15 0717) Pulse Rate:  [74-115] 87 (01/15 0800) Resp:  [14-28] 16 (01/15 0800) BP: (93-130)/(52-81) 106/56 mmHg (01/15 0800) SpO2:  [99 %-100 %] 99 % (01/15 0742) FiO2 (%):  [40 %] 40 % (01/15 0742) Weight:  [93 kg (205 lb 0.4 oz)] 93 kg (205 lb 0.4 oz) (01/15 0242) Last BM Date: 09/26/15  Intake/Output from previous day: 01/14 0701 - 01/15 0700 In: 5839.8 [I.V.:4039.8; NG/GT:1800] Out: 3310 [Urine:3100; Chest Tube:210] Intake/Output this shift: Total I/O In: 302.3 [I.V.:182.3; NG/GT:120] Out: -   General: intubated sedated  Lungs: coarse b/l  Abd: soft, distended  Extremities: +edema b/l LE  Neuro: GCS 6T  Lab Results: CBC   Recent Labs  09/27/15 0340 09/28/15 0415  WBC 9.6 9.1  HGB 8.3* 7.8*  HCT 26.7* 24.2*  PLT 265 295   BMET  Recent Labs  09/28/15 0415 09/29/15 0425  NA 145 141  K 3.7 4.3  CL 114* 105  CO2 27 30  GLUCOSE 134* 154*  BUN 10 12  CREATININE 0.57* 0.61  CALCIUM 7.2* 8.2*   PT/INR No results for input(s): LABPROT, INR in the last 72 hours. ABG No results for input(s): PHART, HCO3 in the last 72 hours.  Invalid input(s): PCO2, PO2  Studies/Results: No results found.  Anti-infectives: Anti-infectives    Start     Dose/Rate Route Frequency Ordered Stop   09/20/15 1700  piperacillin-tazobactam (ZOSYN) IVPB 3.375 g     3.375 g 12.5 mL/hr over 240 Minutes Intravenous Every 8 hours 09/20/15 1540 09/26/15 2117      Medications Scheduled Meds: . antiseptic oral rinse  7 mL Mouth Rinse 10 times per day  . arformoterol  15 mcg Nebulization BID  . budesonide (PULMICORT) nebulizer solution  0.5 mg Nebulization BID  . chlorhexidine gluconate  15 mL Mouth Rinse BID  . enoxaparin (LOVENOX)  injection  40 mg Subcutaneous Q24H  . fentaNYL (SUBLIMAZE) injection  50 mcg Intravenous Once  . free water  300 mL Per Tube Q6H  . ipratropium-albuterol  3 mL Nebulization Q6H  . multivitamin with minerals  1 tablet Oral Daily  . pantoprazole sodium  40 mg Per Tube Daily  . sodium chloride  10 mL Intravenous Q12H  . thiamine  100 mg Oral Daily   Or  . thiamine  100 mg Intravenous Daily   Continuous Infusions: . feeding supplement (PIVOT 1.5 CAL) 1,000 mL (09/29/15 0800)  . fentaNYL infusion INTRAVENOUS 400 mcg/hr (09/29/15 0838)  . propofol (DIPRIVAN) infusion 80 mcg/kg/min (09/29/15 0844)   PRN Meds:.Place/Maintain arterial line **AND** sodium chloride, albuterol, bisacodyl, fentaNYL, midazolam, ondansetron, polyethylene glycol, sodium chloride  Assessment/Plan: Multiple rib fractures with prolonged respiratory failure -stop tube feeds for today -discontinue maintenance fluids to help with edema -continue bronchodilators  LOS: 11 days   [place] Surgeon [phone] Surgery 09/29/2015

## 2015-09-30 ENCOUNTER — Inpatient Hospital Stay (HOSPITAL_COMMUNITY): Payer: Non-veteran care

## 2015-09-30 LAB — GLUCOSE, CAPILLARY
GLUCOSE-CAPILLARY: 63 mg/dL — AB (ref 65–99)
GLUCOSE-CAPILLARY: 67 mg/dL (ref 65–99)
GLUCOSE-CAPILLARY: 85 mg/dL (ref 65–99)
GLUCOSE-CAPILLARY: 90 mg/dL (ref 65–99)
Glucose-Capillary: 90 mg/dL (ref 65–99)
Glucose-Capillary: 82 mg/dL (ref 65–99)
Glucose-Capillary: 86 mg/dL (ref 65–99)
Glucose-Capillary: 80 mg/dL (ref 65–99)

## 2015-09-30 LAB — BASIC METABOLIC PANEL
ANION GAP: 7 (ref 5–15)
BUN: 9 mg/dL (ref 6–20)
CALCIUM: 8.5 mg/dL — AB (ref 8.9–10.3)
CO2: 31 mmol/L (ref 22–32)
CREATININE: 0.56 mg/dL — AB (ref 0.61–1.24)
Chloride: 105 mmol/L (ref 101–111)
Glucose, Bld: 89 mg/dL (ref 65–99)
Potassium: 4.6 mmol/L (ref 3.5–5.1)
SODIUM: 143 mmol/L (ref 135–145)

## 2015-09-30 LAB — CBC
HCT: 27.5 % — ABNORMAL LOW (ref 39.0–52.0)
HEMOGLOBIN: 8.6 g/dL — AB (ref 13.0–17.0)
MCH: 30.3 pg (ref 26.0–34.0)
MCHC: 31.3 g/dL (ref 30.0–36.0)
MCV: 96.8 fL (ref 78.0–100.0)
PLATELETS: 474 10*3/uL — AB (ref 150–400)
RBC: 2.84 MIL/uL — AB (ref 4.22–5.81)
RDW: 14.5 % (ref 11.5–15.5)
WBC: 8.2 10*3/uL (ref 4.0–10.5)

## 2015-09-30 MED ORDER — DEXTROSE 50 % IV SOLN
25.0000 mL | Freq: Once | INTRAVENOUS | Status: AC
  Administered 2015-09-30: 25 mL via INTRAVENOUS

## 2015-09-30 MED ORDER — DEXTROSE 50 % IV SOLN
INTRAVENOUS | Status: AC
  Filled 2015-09-30: qty 50

## 2015-09-30 MED ORDER — PIVOT 1.5 CAL PO LIQD
1000.0000 mL | ORAL | Status: DC
  Administered 2015-09-30: 1000 mL
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
## 2015-09-30 NOTE — Progress Notes (Signed)
Patient ID: [name] [name], male   DOB: 11-24-1950, 65 y.o.   MRN: [id number] Follow up - Trauma Critical Care  Patient Details:    [name] [name] is an 65 y.o. male.  Lines/tubes : Airway 8 mm (Active)  Secured at (cm) 23 cm 09/30/2015  8:15 AM  Measured From Lips 09/30/2015  8:15 AM  Secured Location Left 09/30/2015  8:15 AM  Secured By [employer] 09/30/2015  8:15 AM  Tube Holder Repositioned Yes 09/30/2015  8:15 AM  Cuff Pressure (cm H2O) 26 cm H2O 09/30/2015  3:43 AM  Site Condition Dry 09/30/2015  8:15 AM     CVC Triple Lumen 09/20/15 Left Subclavian (Active)  Indication for Insertion or Continuance of Line Vasoactive infusions 09/29/2015  8:00 PM  Site Assessment Clean;Dry;Intact 09/29/2015  8:00 PM  Proximal Lumen Status Infusing 09/29/2015  8:00 PM  Medial Infusing 09/29/2015  8:00 PM  Distal Lumen Status Infusing 09/29/2015  8:00 PM  Dressing Type Transparent 09/29/2015  8:00 PM  Dressing Status Clean;Dry;Intact 09/29/2015  8:00 PM  Line Care Connections checked and tightened 09/29/2015  8:00 PM  Dressing Intervention New dressing;Antimicrobial disc changed 09/27/2015 11:00 PM  Dressing Change Due 10/04/15 09/29/2015  8:00 PM     Chest Tube 1 Left Pleural (Active)  Suction To water seal 09/29/2015  8:00 PM  Chest Tube Air Leak None 09/29/2015  8:00 PM  Patency Intervention Tip/tilt 09/29/2015  8:00 PM  Drainage Description Serosanguineous 09/29/2015  8:00 PM  Dressing Status Clean;Dry;Intact 09/29/2015  8:00 PM  Dressing Intervention Dressing changed 09/27/2015  8:00 PM  Site Assessment Clean;Dry;Intact 09/29/2015  8:00 PM  Surrounding Skin Unable to view 09/29/2015  8:00 PM  Output (mL) 0 mL 09/30/2015  6:00 AM     NG/OG Tube Orogastric Center mouth (Active)  Placement Verification Auscultation 09/29/2015  8:00 PM  Site Assessment Clean;Dry;Intact 09/29/2015  8:00 PM  Status Irrigated;Infusing tube feed;Suction-low intermittent 09/29/2015  8:00 PM  Drainage Appearance Yellow  09/24/2015  7:56 AM  Intake (mL) 30 mL 09/29/2015  8:00 PM  Output (mL) 0 mL 09/30/2015  6:00 AM     Rectal Tube/Pouch (Active)  Output (mL) 0 mL 09/27/2015  6:00 PM     Urethral Catheter Venia Carbon RN  (Active)  Indication for Insertion or Continuance of Catheter Unstable critical patients (first 24-48 hours) 09/29/2015  8:00 PM  Site Assessment Clean;Intact 09/29/2015  8:00 PM  Catheter Maintenance Bag below level of bladder;Catheter secured;Drainage bag/tubing not touching floor;Insertion date on drainage bag;No dependent loops;Seal intact 09/29/2015  8:00 PM  Collection Container Standard drainage bag 09/29/2015  8:00 PM  Securement Method Leg strap 09/29/2015  8:00 PM  Urinary Catheter Interventions Unclamped 09/29/2015  8:00 PM  Output (mL) 275 mL 09/30/2015  6:00 AM    Microbiology/Sepsis markers: Results for orders placed or performed during the hospital encounter of 09/18/15  MRSA PCR Screening     Status: None   Collection Time: 09/19/15  4:46 AM  Result Value Ref Range Status   MRSA by PCR NEGATIVE NEGATIVE Final    Comment:        The GeneXpert MRSA Assay (FDA approved for NASAL specimens only), is one component of a comprehensive MRSA colonization surveillance program. It is not intended to diagnose MRSA infection nor to guide or monitor treatment for MRSA infections.   C difficile quick scan w PCR reflex     Status: None   Collection Time: 09/22/15  9:15 AM  Result Value Ref  Range Status   C Diff antigen NEGATIVE NEGATIVE Final   C Diff toxin NEGATIVE NEGATIVE Final   C Diff interpretation Negative for toxigenic C. difficile  Final  Culture, respiratory (NON-Expectorated)     Status: None   Collection Time: 09/23/15  8:56 AM  Result Value Ref Range Status   Specimen Description TRACHEAL ASPIRATE  Final   Special Requests Normal  Final   Gram Stain   Final    ABUNDANT WBC PRESENT, PREDOMINANTLY PMN RARE SQUAMOUS EPITHELIAL CELLS PRESENT NO ORGANISMS  SEEN Performed at [facility]    Culture   Final    NORMAL OROPHARYNGEAL FLORA Performed at [facility]    Report Status 09/25/2015 FINAL  Final    Anti-infectives:  Anti-infectives    Start     Dose/Rate Route Frequency Ordered Stop   09/20/15 1700  piperacillin-tazobactam (ZOSYN) IVPB 3.375 g     3.375 g 12.5 mL/hr over 240 Minutes Intravenous Every 8 hours 09/20/15 1540 09/26/15 2117      Best Practice/Protocols:  VTE Prophylaxis: Lovenox (prophylaxtic dose) Continous Sedation  Studies:CXR - 1. Left chest tube in stable position. Interim near complete resolution of tiny left apical pneumothorax. 2. Remaining lines and tubes in stable position. 3. Low lung volumes with bibasilar atelectasis and/or pulmonary infiltrates noted on today's exam. Small bilateral pleural effusions cannot be excluded.  Subjective:    Overnight Issues: stable, passed gas, TF held as they were coming out of his mouth  Objective:  Vital signs for last 24 hours: Temp:  [98.4 F (36.9 C)-99.3 F (37.4 C)] 98.4 F (36.9 C) (01/16 0720) Pulse Rate:  [85-103] 92 (01/16 0815) Resp:  [16-32] 25 (01/16 0815) BP: (104-134)/(49-79) 109/59 mmHg (01/16 0815) SpO2:  [95 %-100 %] 97 % (01/16 0815) FiO2 (%):  [40 %] 40 % (01/16 0815) Weight:  [92.5 kg (203 lb 14.8 oz)] 92.5 kg (203 lb 14.8 oz) (01/16 0319)  Hemodynamic parameters for last 24 hours: CVP:  [10 mmHg-14 mmHg] 10 mmHg  Intake/Output from previous day: 01/15 0701 - 01/16 0700 In: 1900.6 [I.V.:1750.6; NG/GT:150] Out: 2425 [Urine:2375; Chest Tube:50]  Intake/Output this shift:    Vent settings for last 24 hours: Vent Mode:  [-] PRVC FiO2 (%):  [40 %] 40 % Set Rate:  [18 bmp] 18 bmp Vt Set:  [560 mL] 560 mL PEEP:  [5 cmH20] 5 cmH20 Plateau Pressure:  [24 cmH20-39 cmH20] 31 cmH20  Physical Exam:  General: on vent Neuro: sedated HEENT/Neck: ETT Resp: wheezes bilaterally CVS: RRR GI: soft, NT, ND,  +BS Extremities: PAS  Results for orders placed or performed during the hospital encounter of 09/18/15 (from the past 24 hour(s))  Glucose, capillary     Status: Abnormal   Collection Time: 09/29/15 11:11 AM  Result Value Ref Range   Glucose-Capillary 109 (H) 65 - 99 mg/dL   Comment 1 Capillary Specimen    Comment 2 Notify RN   Glucose, capillary     Status: None   Collection Time: 09/29/15  3:07 PM  Result Value Ref Range   Glucose-Capillary 92 65 - 99 mg/dL   Comment 1 Capillary Specimen    Comment 2 Notify RN   Glucose, capillary     Status: None   Collection Time: 09/29/15  7:11 PM  Result Value Ref Range   Glucose-Capillary 82 65 - 99 mg/dL   Comment 1 Notify RN    Comment 2 Document in Chart   Glucose, capillary  Status: None   Collection Time: 09/29/15 11:54 PM  Result Value Ref Range   Glucose-Capillary 90 65 - 99 mg/dL   Comment 1 Notify RN    Comment 2 Document in Chart   Glucose, capillary     Status: Abnormal   Collection Time: 09/30/15  3:45 AM  Result Value Ref Range   Glucose-Capillary 63 (L) 65 - 99 mg/dL   Comment 1 Notify RN    Comment 2 Document in Chart   Basic metabolic panel     Status: Abnormal   Collection Time: 09/30/15  4:11 AM  Result Value Ref Range   Sodium 143 135 - 145 mmol/L   Potassium 4.6 3.5 - 5.1 mmol/L   Chloride 105 101 - 111 mmol/L   CO2 31 22 - 32 mmol/L   Glucose, Bld 89 65 - 99 mg/dL   BUN 9 6 - 20 mg/dL   Creatinine, Ser 0.56 (L) 0.61 - 1.24 mg/dL   Calcium 8.5 (L) 8.9 - 10.3 mg/dL   GFR calc non Af Amer >60 >60 mL/min   GFR calc Af Amer >60 >60 mL/min   Anion gap 7 5 - 15  CBC     Status: Abnormal   Collection Time: 09/30/15  4:11 AM  Result Value Ref Range   WBC 8.2 4.0 - 10.5 K/uL   RBC 2.84 (L) 4.22 - 5.81 MIL/uL   Hemoglobin 8.6 (L) 13.0 - 17.0 g/dL   HCT 27.5 (L) 39.0 - 52.0 %   MCV 96.8 78.0 - 100.0 fL   MCH 30.3 26.0 - 34.0 pg   MCHC 31.3 30.0 - 36.0 g/dL   RDW 14.5 11.5 - 15.5 %   Platelets 474 (H) 150 -  400 K/uL  Glucose, capillary     Status: None   Collection Time: 09/30/15  4:42 AM  Result Value Ref Range   Glucose-Capillary 90 65 - 99 mg/dL   Comment 1 Notify RN    Comment 2 Document in Chart     Assessment & Plan: Present on Admission:  . Fracture of multiple ribs . Multiple rib fractures   LOS: 12 days   Additional comments:I reviewed the patient's new clinical lab test results. and CXR [facility] Multiple left rib fxs w/pulmonary contusions - L CT on water seal, output going down, possible D/C CT tomorrow if PTX stable Left renal contusion ARF w/ARDS - on conventional settings but not weaning well, anticipate trach possibly later this week ABL anemia - up a bit Hypernatremia - improved on free water FEN - resume TF just at 10/h and see how he tolerates VTE - SCD's, start Lovenox Dispo - ICU Critical Care Total Time*: 91 Minutes  Georganna Skeans, MD, MPH, FACS Trauma: [phone] General Surgery: [phone]  09/30/2015  *Care during the described time interval was provided by me. I have reviewed this patient's available data, including medical history, events of note, physical examination and test results as part of my evaluation.

## 2015-10-01 ENCOUNTER — Inpatient Hospital Stay (HOSPITAL_COMMUNITY): Payer: Non-veteran care

## 2015-10-01 ENCOUNTER — Encounter (HOSPITAL_COMMUNITY): Payer: Self-pay | Admitting: *Deleted

## 2015-10-01 LAB — GLUCOSE, CAPILLARY
GLUCOSE-CAPILLARY: 86 mg/dL (ref 65–99)
GLUCOSE-CAPILLARY: 95 mg/dL (ref 65–99)
Glucose-Capillary: 101 mg/dL — ABNORMAL HIGH (ref 65–99)
Glucose-Capillary: 105 mg/dL — ABNORMAL HIGH (ref 65–99)
Glucose-Capillary: 112 mg/dL — ABNORMAL HIGH (ref 65–99)
Glucose-Capillary: 86 mg/dL (ref 65–99)
Glucose-Capillary: 87 mg/dL (ref 65–99)

## 2015-10-01 LAB — CBC
HCT: 27 % — ABNORMAL LOW (ref 39.0–52.0)
Hemoglobin: 8.4 g/dL — ABNORMAL LOW (ref 13.0–17.0)
MCH: 30.1 pg (ref 26.0–34.0)
MCHC: 31.1 g/dL (ref 30.0–36.0)
MCV: 96.8 fL (ref 78.0–100.0)
PLATELETS: 495 10*3/uL — AB (ref 150–400)
RBC: 2.79 MIL/uL — AB (ref 4.22–5.81)
RDW: 14.4 % (ref 11.5–15.5)
WBC: 7.3 10*3/uL (ref 4.0–10.5)

## 2015-10-01 LAB — TRIGLYCERIDES: Triglycerides: 289 mg/dL — ABNORMAL HIGH (ref <150)

## 2015-10-01 LAB — BASIC METABOLIC PANEL
ANION GAP: 8 (ref 5–15)
BUN: 7 mg/dL (ref 6–20)
CALCIUM: 8.4 mg/dL — AB (ref 8.9–10.3)
CO2: 33 mmol/L — ABNORMAL HIGH (ref 22–32)
CREATININE: 0.59 mg/dL — AB (ref 0.61–1.24)
Chloride: 102 mmol/L (ref 101–111)
GFR calc non Af Amer: >60 mL/min (ref >60)
Glucose, Bld: 104 mg/dL — ABNORMAL HIGH (ref 65–99)
Potassium: 4.1 mmol/L (ref 3.5–5.1)
Sodium: 143 mmol/L (ref 135–145)

## 2015-10-01 MED ORDER — CLONAZEPAM 0.5 MG PO TBDP
0.5000 mg | ORAL_TABLET | Freq: Two times a day (BID) | ORAL | Status: DC
  Administered 2015-10-01 – 2015-10-03 (×6): 0.5 mg via ORAL
  Filled 2015-10-01 (×6): qty 1

## 2015-10-01 MED ORDER — CLONAZEPAM 0.1 MG/ML ORAL SUSPENSION: 0.5000 mg | Freq: Two times a day (BID) | ORAL | Status: DC

## 2015-10-01 MED ORDER — PIVOT 1.5 CAL PO LIQD
1000.0000 mL | ORAL | Status: DC
  Administered 2015-10-01: 1000 mL

## 2015-10-01 NOTE — Progress Notes (Signed)
Patient ID: [name] [name], male   DOB: 23-Jun-1951, 65 y.o.   MRN: [id number] Follow up - Trauma Critical Care  Patient Details:    [name] [name] is an 65 y.o. male.  Lines/tubes : Airway 8 mm (Active)  Secured at (cm) 23 cm 10/01/2015  7:48 AM  Measured From Lips 10/01/2015  7:48 AM  Secured Location Left 10/01/2015  7:48 AM  Secured By [employer] 10/01/2015  7:48 AM  Tube Holder Repositioned Yes 10/01/2015  7:48 AM  Cuff Pressure (cm H2O) 26 cm H2O 10/01/2015  4:59 AM  Site Condition Dry 10/01/2015  7:48 AM     CVC Triple Lumen 09/20/15 Left Subclavian (Active)  Indication for Insertion or Continuance of Line Prolonged intravenous therapies 09/30/2015  7:32 PM  Site Assessment Clean;Dry;Intact 09/30/2015  7:32 PM  Proximal Lumen Status Infusing 09/30/2015  7:32 PM  Medial Infusing 09/30/2015  7:32 PM  Distal Lumen Status Infusing 09/30/2015  7:32 PM  Dressing Type Transparent 09/30/2015  7:32 PM  Dressing Status Clean;Dry;Intact 09/30/2015  7:32 PM  Line Care Connections checked and tightened 09/30/2015  7:32 PM  Dressing Intervention New dressing;Antimicrobial disc changed 09/27/2015 11:00 PM  Dressing Change Due 10/04/15 09/29/2015  8:00 PM     Chest Tube 1 Left Pleural (Active)  Suction To water seal 10/01/2015 12:00 AM  Chest Tube Air Leak None 10/01/2015 12:00 AM  Patency Intervention Tip/tilt 09/29/2015  8:00 PM  Drainage Description Serous;Yellow 10/01/2015 12:00 AM  Dressing Status Clean;Dry;Intact 10/01/2015 12:00 AM  Dressing Intervention Dressing changed 09/27/2015  8:00 PM  Site Assessment Clean;Dry;Intact 10/01/2015 12:00 AM  Surrounding Skin Unable to view 10/01/2015 12:00 AM  Output (mL) 0 mL 10/01/2015  6:00 AM     NG/OG Tube Orogastric Center mouth (Active)  Placement Verification Auscultation 10/01/2015 12:00 AM  Site Assessment Clean;Dry;Intact 10/01/2015 12:00 AM  Status Infusing tube feed;Irrigated 10/01/2015 12:00 AM  Drainage Appearance Yellow 09/24/2015  7:56 AM   Intake (mL) 30 mL 09/30/2015  8:00 PM  Output (mL) 0 mL 09/30/2015  6:00 AM     Urethral Catheter Venia Carbon RN  (Active)  Indication for Insertion or Continuance of Catheter Unstable critical patients (first 24-48 hours) 10/01/2015 12:00 AM  Site Assessment Clean;Intact 10/01/2015 12:00 AM  Catheter Maintenance Bag below level of bladder;Catheter secured;Drainage bag/tubing not touching floor;Insertion date on drainage bag;No dependent loops;Seal intact 10/01/2015 12:00 AM  Collection Container Standard drainage bag 10/01/2015 12:00 AM  Securement Method Leg strap 10/01/2015 12:00 AM  Urinary Catheter Interventions Unclamped 10/01/2015 12:00 AM  Output (mL) 350 mL 10/01/2015  6:00 AM    Microbiology/Sepsis markers: Results for orders placed or performed during the hospital encounter of 09/18/15  MRSA PCR Screening     Status: None   Collection Time: 09/19/15  4:46 AM  Result Value Ref Range Status   MRSA by PCR NEGATIVE NEGATIVE Final    Comment:        The GeneXpert MRSA Assay (FDA approved for NASAL specimens only), is one component of a comprehensive MRSA colonization surveillance program. It is not intended to diagnose MRSA infection nor to guide or monitor treatment for MRSA infections.   C difficile quick scan w PCR reflex     Status: None   Collection Time: 09/22/15  9:15 AM  Result Value Ref Range Status   C Diff antigen NEGATIVE NEGATIVE Final   C Diff toxin NEGATIVE NEGATIVE Final   C Diff interpretation Negative for toxigenic C. difficile  Final  Culture, respiratory (NON-Expectorated)     Status: None   Collection Time: 09/23/15  8:56 AM  Result Value Ref Range Status   Specimen Description TRACHEAL ASPIRATE  Final   Special Requests Normal  Final   Gram Stain   Final    ABUNDANT WBC PRESENT, PREDOMINANTLY PMN RARE SQUAMOUS EPITHELIAL CELLS PRESENT NO ORGANISMS SEEN Performed at [facility]    Culture   Final    NORMAL OROPHARYNGEAL FLORA Performed  at [facility]    Report Status 09/25/2015 FINAL  Final    Anti-infectives:  Anti-infectives    Start     Dose/Rate Route Frequency Ordered Stop   09/20/15 1700  piperacillin-tazobactam (ZOSYN) IVPB 3.375 g     3.375 g 12.5 mL/hr over 240 Minutes Intravenous Every 8 hours 09/20/15 1540 09/26/15 2117      Best Practice/Protocols:  VTE Prophylaxis: Lovenox (prophylaxtic dose) Continous Sedation  Consults:      Studies:CXR - similar to 1/16  Subjective:    Overnight Issues:  stable Objective:  Vital signs for last 24 hours: Temp:  [98 F (36.7 C)-100.2 F (37.9 C)] 98.5 F (36.9 C) (01/17 0724) Pulse Rate:  [96-109] 103 (01/17 0748) Resp:  [15-27] 25 (01/17 0748) BP: (110-147)/(58-82) 116/63 mmHg (01/17 0748) SpO2:  [94 %-100 %] 100 % (01/17 0748) FiO2 (%):  [40 %] 40 % (01/17 0748) Weight:  [87.3 kg (192 lb 7.4 oz)] 87.3 kg (192 lb 7.4 oz) (01/17 0317)  Hemodynamic parameters for last 24 hours: CVP:  [7 mmHg-10 mmHg] 8 mmHg  Intake/Output from previous day: 01/16 0701 - 01/17 0700 In: 3487.9 [I.V.:1945.4; NG/GT:1542.5] Out: 5070 [Urine:5070]  Intake/Output this shift:    Vent settings for last 24 hours: Vent Mode:  [-] PSV;CPAP FiO2 (%):  [40 %] 40 % Set Rate:  [18 bmp] 18 bmp Vt Set:  [560 mL] 560 mL PEEP:  [5 cmH20] 5 cmH20 Pressure Support:  [10 cmH20] 10 cmH20 Plateau Pressure:  [20 cmH20-22 cmH20] 20 cmH20  Physical Exam:  General: on vent Neuro: quite sedated HEENT/Neck: ETT Resp: wheezes bilaterally CVS: RRR GI: soft, NT Extremities: min edema  Results for orders placed or performed during the hospital encounter of 09/18/15 (from the past 24 hour(s))  Glucose, capillary     Status: None   Collection Time: 09/30/15 11:16 AM  Result Value Ref Range   Glucose-Capillary 67 65 - 99 mg/dL   Comment 1 Capillary Specimen    Comment 2 Notify RN   Glucose, capillary     Status: None   Collection Time: 09/30/15  2:16 PM  Result Value  Ref Range   Glucose-Capillary 85 65 - 99 mg/dL   Comment 1 Capillary Specimen   Glucose, capillary     Status: None   Collection Time: 09/30/15  3:28 PM  Result Value Ref Range   Glucose-Capillary 86 65 - 99 mg/dL   Comment 1 Capillary Specimen    Comment 2 Notify RN   Glucose, capillary     Status: None   Collection Time: 09/30/15  8:45 PM  Result Value Ref Range   Glucose-Capillary 80 65 - 99 mg/dL   Comment 1 Notify RN    Comment 2 Document in Chart   Glucose, capillary     Status: None   Collection Time: 09/30/15 11:45 PM  Result Value Ref Range   Glucose-Capillary 86 65 - 99 mg/dL   Comment 1 Notify RN    Comment 2 Document in Chart  Glucose, capillary     Status: None   Collection Time: 10/01/15  3:17 AM  Result Value Ref Range   Glucose-Capillary 87 65 - 99 mg/dL   Comment 1 Notify RN    Comment 2 Document in Chart   Triglycerides     Status: Abnormal   Collection Time: 10/01/15  4:10 AM  Result Value Ref Range   Triglycerides 289 (H) <150 mg/dL  Basic metabolic panel     Status: Abnormal   Collection Time: 10/01/15  4:10 AM  Result Value Ref Range   Sodium 143 135 - 145 mmol/L   Potassium 4.1 3.5 - 5.1 mmol/L   Chloride 102 101 - 111 mmol/L   CO2 33 (H) 22 - 32 mmol/L   Glucose, Bld 104 (H) 65 - 99 mg/dL   BUN 7 6 - 20 mg/dL   Creatinine, Ser 0.59 (L) 0.61 - 1.24 mg/dL   Calcium 8.4 (L) 8.9 - 10.3 mg/dL   GFR calc non Af Amer >60 >60 mL/min   GFR calc Af Amer >60 >60 mL/min   Anion gap 8 5 - 15  CBC     Status: Abnormal   Collection Time: 10/01/15  4:10 AM  Result Value Ref Range   WBC 7.3 4.0 - 10.5 K/uL   RBC 2.79 (L) 4.22 - 5.81 MIL/uL   Hemoglobin 8.4 (L) 13.0 - 17.0 g/dL   HCT 27.0 (L) 39.0 - 52.0 %   MCV 96.8 78.0 - 100.0 fL   MCH 30.1 26.0 - 34.0 pg   MCHC 31.1 30.0 - 36.0 g/dL   RDW 14.4 11.5 - 15.5 %   Platelets 495 (H) 150 - 400 K/uL    Assessment & Plan: Present on Admission:  . Fracture of multiple ribs . Multiple rib fractures    LOS: 13 days   Additional comments:I reviewed the patient's new clinical lab test results. and CXR [facility] Multiple left rib fxs w/pulmonary contusions - D/C L CT Left renal contusion ARF w/ARDS - weaning well this AM on 10/5. Add Klonopin ABL anemia - stable Hypernatremia - improved on free water FEN - increase TF to 20cc/h VTE - SCD's, Lovenox Dispo - ICU Critical Care Total Time*: 32 Minutes  Georganna Skeans, MD, MPH, FACS Trauma: [phone] General Surgery: [phone]  10/01/2015  *Care during the described time interval was provided by me. I have reviewed this patient's available data, including medical history, events of note, physical examination and test results as part of my evaluation.

## 2015-10-02 ENCOUNTER — Inpatient Hospital Stay (HOSPITAL_COMMUNITY): Payer: Non-veteran care

## 2015-10-02 DIAGNOSIS — L8990 Pressure ulcer of unspecified site, unspecified stage: Secondary | ICD-10-CM | POA: Diagnosis not present

## 2015-10-02 LAB — BASIC METABOLIC PANEL
Anion gap: 7 (ref 5–15)
BUN: 5 mg/dL — ABNORMAL LOW (ref 6–20)
CHLORIDE: 102 mmol/L (ref 101–111)
CO2: 31 mmol/L (ref 22–32)
CREATININE: 0.61 mg/dL (ref 0.61–1.24)
Calcium: 8.4 mg/dL — ABNORMAL LOW (ref 8.9–10.3)
GFR calc non Af Amer: >60 mL/min (ref >60)
Glucose, Bld: 118 mg/dL — ABNORMAL HIGH (ref 65–99)
Potassium: 4.4 mmol/L (ref 3.5–5.1)
Sodium: 140 mmol/L (ref 135–145)

## 2015-10-02 LAB — GLUCOSE, CAPILLARY
GLUCOSE-CAPILLARY: 99 mg/dL (ref 65–99)
GLUCOSE-CAPILLARY: 101 mg/dL — AB (ref 65–99)
Glucose-Capillary: 112 mg/dL — ABNORMAL HIGH (ref 65–99)
Glucose-Capillary: 106 mg/dL — ABNORMAL HIGH (ref 65–99)
Glucose-Capillary: 110 mg/dL — ABNORMAL HIGH (ref 65–99)

## 2015-10-02 LAB — CBC
HEMATOCRIT: 27.3 % — AB (ref 39.0–52.0)
HEMOGLOBIN: 8.9 g/dL — AB (ref 13.0–17.0)
MCH: 31.6 pg (ref 26.0–34.0)
MCHC: 32.6 g/dL (ref 30.0–36.0)
MCV: 96.8 fL (ref 78.0–100.0)
Platelets: 596 10*3/uL — ABNORMAL HIGH (ref 150–400)
RBC: 2.82 MIL/uL — ABNORMAL LOW (ref 4.22–5.81)
RDW: 14.3 % (ref 11.5–15.5)
WBC: 9.2 10*3/uL (ref 4.0–10.5)

## 2015-10-02 MED ORDER — SODIUM CHLORIDE 0.9 % IJ SOLN
10.0000 mL | Freq: Two times a day (BID) | INTRAMUSCULAR | Status: DC
  Administered 2015-10-03 – 2015-10-08 (×8): 10 mL
  Administered 2015-10-09: 40 mL
  Administered 2015-10-10 – 2015-10-11 (×3): 10 mL
  Administered 2015-10-11 – 2015-10-13 (×5): 20 mL
  Administered 2015-10-14 – 2015-10-28 (×17): 10 mL
  Administered 2015-10-28: 20 mL
  Administered 2015-11-01 – 2015-11-02 (×2): 10 mL
  Administered 2015-11-03: 30 mL

## 2015-10-02 MED ORDER — ANTISEPTIC ORAL RINSE SOLUTION (CORINZ)
7.0000 mL | Freq: Four times a day (QID) | OROMUCOSAL | Status: DC
  Administered 2015-10-02 – 2015-10-06 (×13): 7 mL via OROMUCOSAL

## 2015-10-02 MED ORDER — FUROSEMIDE 10 MG/ML IJ SOLN
40.0000 mg | Freq: Once | INTRAMUSCULAR | Status: AC
  Administered 2015-10-02: 40 mg via INTRAVENOUS
  Filled 2015-10-02: qty 4

## 2015-10-02 MED ORDER — FREE WATER
150.0000 mL | Freq: Four times a day (QID) | Status: DC
  Administered 2015-10-02 – 2015-10-03 (×3): 150 mL

## 2015-10-02 MED ORDER — CHLORHEXIDINE GLUCONATE 0.12% ORAL RINSE (MEDLINE KIT)
15.0000 mL | Freq: Two times a day (BID) | OROMUCOSAL | Status: DC
  Administered 2015-10-03 – 2015-10-05 (×6): 15 mL via OROMUCOSAL

## 2015-10-02 MED ORDER — SODIUM CHLORIDE 0.9 % IJ SOLN
10.0000 mL | INTRAMUSCULAR | Status: DC | PRN
  Administered 2015-10-04 – 2015-10-22 (×3): 10 mL
  Filled 2015-10-02 (×3): qty 40

## 2015-10-02 NOTE — Progress Notes (Signed)
Peripherally Inserted Central Catheter/Midline Placement  The IV Nurse has discussed with the patient and/or persons authorized to consent for the patient, the purpose of this procedure and the potential benefits and risks involved with this procedure.  The benefits include less needle sticks, lab draws from the catheter and patient may be discharged home with the catheter.  Risks include, but not limited to, infection, bleeding, blood clot (thrombus formation), and puncture of an artery; nerve damage and irregular heat beat.  Alternatives to this procedure were also discussed.  PICC/Midline Placement Documentation     Telephone consent by Maryla Morrow daughter   Christella Noa Albarece 10/02/2015, 12:10 PM

## 2015-10-02 NOTE — Progress Notes (Signed)
Pt laid flat for repositioning, copious amounts of tan secretions (appears to be TF), filled the ETT and into the HME.  Similar episode reported this weekend and TF had been d/c.  Dr. Grandville Silos made aware and order placed to turn off TF.  Pt suctioned, and RT changed circuit. Pt tolerated well.

## 2015-10-02 NOTE — Progress Notes (Addendum)
Patient ID: [name] [name], male   DOB: 1950/09/27, 65 y.o.   MRN: [id number] Follow up - Trauma Critical Care  Patient Details:    [name] [name] is an 65 y.o. male.  Lines/tubes : Airway 8 mm (Active)  Secured at (cm) 23 cm 10/02/2015  3:24 AM  Measured From Lips 10/02/2015  8:00 AM  Secured Location Left 10/02/2015  8:00 AM  Secured By [employer] 10/02/2015  8:00 AM  Tube Holder Repositioned Yes 10/02/2015  8:00 AM  Cuff Pressure (cm H2O) 26 cm H2O 10/02/2015  3:24 AM  Site Condition Dry 10/02/2015  8:00 AM     CVC Triple Lumen 09/20/15 Left Subclavian (Active)  Indication for Insertion or Continuance of Line Poor Vasculature-patient has had multiple peripheral attempts or PIVs lasting less than 24 hours 10/02/2015  8:00 AM  Site Assessment Clean;Dry;Intact 10/02/2015  8:00 AM  Proximal Lumen Status Infusing 10/02/2015  8:00 AM  Medial Infusing 10/02/2015  8:00 AM  Distal Lumen Status Infusing 10/02/2015  8:00 AM  Dressing Type Transparent 10/02/2015  8:00 AM  Dressing Status Clean 10/02/2015  8:00 AM  Line Care Connections checked and tightened 10/02/2015  8:00 AM  Dressing Intervention New dressing;Antimicrobial disc changed 09/27/2015 11:00 PM  Dressing Change Due 10/04/15 10/02/2015  8:00 AM     NG/OG Tube Orogastric Center mouth (Active)  Placement Verification Auscultation 10/02/2015  8:00 AM  Site Assessment Clean;Dry;Intact 10/02/2015  8:00 AM  Status Infusing tube feed;Irrigated 10/02/2015  8:00 AM  Drainage Appearance Yellow 10/02/2015  8:00 AM  Intake (mL) 30 mL 10/01/2015  8:00 AM  Output (mL) 0 mL 10/01/2015  8:00 AM     Urethral Catheter Venia Carbon RN  (Active)  Indication for Insertion or Continuance of Catheter Unstable critical patients (first 24-48 hours) 10/02/2015  8:00 AM  Site Assessment Clean;Intact 10/02/2015  8:00 AM  Catheter Maintenance Bag below level of bladder;Catheter secured;Drainage bag/tubing not touching floor;Insertion date on drainage bag;No  dependent loops;Seal intact 10/02/2015  8:00 AM  Collection Container Standard drainage bag 10/02/2015  8:00 AM  Securement Method Leg strap 10/02/2015  8:00 AM  Urinary Catheter Interventions Unclamped 10/02/2015  8:00 AM  Output (mL) 275 mL 10/02/2015  8:00 AM    Microbiology/Sepsis markers: Results for orders placed or performed during the hospital encounter of 09/18/15  MRSA PCR Screening     Status: None   Collection Time: 09/19/15  4:46 AM  Result Value Ref Range Status   MRSA by PCR NEGATIVE NEGATIVE Final    Comment:        The GeneXpert MRSA Assay (FDA approved for NASAL specimens only), is one component of a comprehensive MRSA colonization surveillance program. It is not intended to diagnose MRSA infection nor to guide or monitor treatment for MRSA infections.   C difficile quick scan w PCR reflex     Status: None   Collection Time: 09/22/15  9:15 AM  Result Value Ref Range Status   C Diff antigen NEGATIVE NEGATIVE Final   C Diff toxin NEGATIVE NEGATIVE Final   C Diff interpretation Negative for toxigenic C. difficile  Final  Culture, respiratory (NON-Expectorated)     Status: None   Collection Time: 09/23/15  8:56 AM  Result Value Ref Range Status   Specimen Description TRACHEAL ASPIRATE  Final   Special Requests Normal  Final   Gram Stain   Final    ABUNDANT WBC PRESENT, PREDOMINANTLY PMN RARE SQUAMOUS EPITHELIAL CELLS PRESENT NO ORGANISMS SEEN Performed at  [facility]   Final    NORMAL OROPHARYNGEAL FLORA Performed at [facility]    Report Status 09/25/2015 FINAL  Final    Anti-infectives:  Anti-infectives    Start     Dose/Rate Route Frequency Ordered Stop   09/20/15 1700  piperacillin-tazobactam (ZOSYN) IVPB 3.375 g     3.375 g 12.5 mL/hr over 240 Minutes Intravenous Every 8 hours 09/20/15 1540 09/26/15 2117      Best Practice/Protocols:  VTE Prophylaxis: Lovenox (prophylaxtic dose) Continous Sedation  Consults:       Studies:CXR -  1. Left chest tube removed. No pneumothorax identified. 2. Otherwise, stable lines and tubes. 3. Patchy and confluent bibasilar opacity not significantly changed Subjective:    Overnight Issues:  Just has emesis Objective:  Vital signs for last 24 hours: Temp:  [98.4 F (36.9 C)-99.5 F (37.5 C)] 98.4 F (36.9 C) (01/18 0726) Pulse Rate:  [99-109] 105 (01/18 0800) Resp:  [20-30] 23 (01/18 0800) BP: (115-145)/(59-88) 145/88 mmHg (01/18 0800) SpO2:  [94 %-99 %] 98 % (01/18 0800) FiO2 (%):  [40 %] 40 % (01/18 0800) Weight:  [84.2 kg (185 lb 10 oz)] 84.2 kg (185 lb 10 oz) (01/18 0309)  Hemodynamic parameters for last 24 hours: CVP:  [7 mmHg-10 mmHg] 8 mmHg  Intake/Output from previous day: 01/17 0701 - 01/18 0700 In: 3355.2 [I.V.:1975.2; NG/GT:1380] Out: 5085 [Urine:5065; Chest Tube:20]  Intake/Output this shift: Total I/O In: 102.3 [I.V.:82.3; NG/GT:20] Out: 275 [Urine:275]  Vent settings for last 24 hours: Vent Mode:  [-] PRVC FiO2 (%):  [40 %] 40 % Set Rate:  [18 bmp] 18 bmp Vt Set:  [560 mL] 560 mL PEEP:  [5 cmH20] 5 cmH20 Pressure Support:  [10 cmH20] 10 cmH20 Plateau Pressure:  [20 cmH20-23 cmH20] 21 cmH20  Physical Exam:  General: on vent Neuro: moderate sedation HEENT/Neck: ETT Resp: wheezes bilaterally CVS: RRR GI: soft, NT, ND Extremities: edema 2+  Results for orders placed or performed during the hospital encounter of 09/18/15 (from the past 24 hour(s))  Glucose, capillary     Status: None   Collection Time: 10/01/15 11:18 AM  Result Value Ref Range   Glucose-Capillary 95 65 - 99 mg/dL   Comment 1 Capillary Specimen    Comment 2 Notify RN   Glucose, capillary     Status: Abnormal   Collection Time: 10/01/15  3:20 PM  Result Value Ref Range   Glucose-Capillary 101 (H) 65 - 99 mg/dL   Comment 1 Capillary Specimen    Comment 2 Notify RN   Glucose, capillary     Status: Abnormal   Collection Time: 10/01/15  7:11 PM  Result  Value Ref Range   Glucose-Capillary 105 (H) 65 - 99 mg/dL   Comment 1 Capillary Specimen    Comment 2 Notify RN    Comment 3 Document in Chart   Glucose, capillary     Status: Abnormal   Collection Time: 10/01/15 11:20 PM  Result Value Ref Range   Glucose-Capillary 112 (H) 65 - 99 mg/dL   Comment 1 Capillary Specimen    Comment 2 Notify RN    Comment 3 Document in Chart   Glucose, capillary     Status: Abnormal   Collection Time: 10/02/15  3:36 AM  Result Value Ref Range   Glucose-Capillary 112 (H) 65 - 99 mg/dL   Comment 1 Capillary Specimen    Comment 2 Notify RN    Comment 3 Document in  Chart   CBC     Status: Abnormal   Collection Time: 10/02/15  4:00 AM  Result Value Ref Range   WBC 9.2 4.0 - 10.5 K/uL   RBC 2.82 (L) 4.22 - 5.81 MIL/uL   Hemoglobin 8.9 (L) 13.0 - 17.0 g/dL   HCT 27.3 (L) 39.0 - 52.0 %   MCV 96.8 78.0 - 100.0 fL   MCH 31.6 26.0 - 34.0 pg   MCHC 32.6 30.0 - 36.0 g/dL   RDW 14.3 11.5 - 15.5 %   Platelets 596 (H) 150 - 400 K/uL  Basic metabolic panel     Status: Abnormal   Collection Time: 10/02/15  4:00 AM  Result Value Ref Range   Sodium 140 135 - 145 mmol/L   Potassium 4.4 3.5 - 5.1 mmol/L   Chloride 102 101 - 111 mmol/L   CO2 31 22 - 32 mmol/L   Glucose, Bld 118 (H) 65 - 99 mg/dL   BUN 5 (L) 6 - 20 mg/dL   Creatinine, Ser 0.61 0.61 - 1.24 mg/dL   Calcium 8.4 (L) 8.9 - 10.3 mg/dL   GFR calc non Af Amer >60 >60 mL/min   GFR calc Af Amer >60 >60 mL/min   Anion gap 7 5 - 15    Assessment & Plan: Present on Admission:  . Fracture of multiple ribs . Multiple rib fractures   LOS: 14 days   Additional comments:I reviewed the patient's new clinical lab test results. and CXR [facility] Multiple left rib fxs w/pulmonary contusions - L CT out and no PTX Left renal contusion ARF w/ARDS - weaned well yesterday. Wean again today, hope for trial of extubation tomorrow. Just had emesis so will not extubate this AM. ABL anemia - stable Hypernatremia -  improved, decrease free water Penile pressure wound - WOC eval FEN - hold TF for 12h VTE - SCD's, Lovenox Dispo - ICU Critical Care Total Time*: 34 Minutes  Georganna Skeans, MD, MPH, FACS Trauma: [phone] General Surgery: [phone]  10/02/2015  *Care during the described time interval was provided by me. I have reviewed this patient's available data, including medical history, events of note, physical examination and test results as part of my evaluation.

## 2015-10-02 NOTE — Consult Note (Signed)
WOC wound consult note Reason for Consult: Consult requested for area around penis.  Pt has very swollen scrotum and this has created pressure on the area surrounding the tip of his penis.  It is not possible to get the shaft of the penis to remain retracted when attempted manually.  There are several partial thickness skin loss areas within the skin folds.  They do not correspond to the foley tubing in shape or size, which is why they appear to be related to the scrotal edema instead of device related. Wound type: Patchy areas are pink and moist, each approx .2X.2X.1cm, located in folds directly underneath penis shaft, there are approx 4 sites. Dressing procedure/placement/frequency: It will be difficult to promote healing until scrotal edema subsides.  Xeroform gauze to promote healing and reduce pressure surrounding penis tip. Please re-consult if further assistance is needed.  Thank-you,  Julien Girt MSN, [place], [place], [place], [place]

## 2015-10-03 LAB — GLUCOSE, CAPILLARY
GLUCOSE-CAPILLARY: 78 mg/dL (ref 65–99)
GLUCOSE-CAPILLARY: 88 mg/dL (ref 65–99)
GLUCOSE-CAPILLARY: 97 mg/dL (ref 65–99)
Glucose-Capillary: 77 mg/dL (ref 65–99)
Glucose-Capillary: 90 mg/dL (ref 65–99)
Glucose-Capillary: 92 mg/dL (ref 65–99)

## 2015-10-03 LAB — BASIC METABOLIC PANEL
Anion gap: 6 (ref 5–15)
BUN: 8 mg/dL (ref 6–20)
CALCIUM: 8.6 mg/dL — AB (ref 8.9–10.3)
CO2: 30 mmol/L (ref 22–32)
CREATININE: 0.62 mg/dL (ref 0.61–1.24)
Chloride: 102 mmol/L (ref 101–111)
GFR calc non Af Amer: >60 mL/min (ref >60)
Glucose, Bld: 94 mg/dL (ref 65–99)
Potassium: 3.8 mmol/L (ref 3.5–5.1)
SODIUM: 138 mmol/L (ref 135–145)

## 2015-10-03 LAB — CBC
HCT: 28 % — ABNORMAL LOW (ref 39.0–52.0)
Hemoglobin: 8.9 g/dL — ABNORMAL LOW (ref 13.0–17.0)
MCH: 29.9 pg (ref 26.0–34.0)
MCHC: 31.8 g/dL (ref 30.0–36.0)
MCV: 94 fL (ref 78.0–100.0)
PLATELETS: 672 10*3/uL — AB (ref 150–400)
RBC: 2.98 MIL/uL — ABNORMAL LOW (ref 4.22–5.81)
RDW: 14.3 % (ref 11.5–15.5)
WBC: 9.1 10*3/uL (ref 4.0–10.5)

## 2015-10-03 MED ORDER — FREE WATER
100.0000 mL | Freq: Four times a day (QID) | Status: DC
  Administered 2015-10-03 – 2015-10-04 (×4): 100 mL

## 2015-10-03 MED ORDER — FUROSEMIDE 10 MG/ML IJ SOLN
40.0000 mg | Freq: Once | INTRAMUSCULAR | Status: AC
  Administered 2015-10-03: 40 mg via INTRAVENOUS
  Filled 2015-10-03: qty 4

## 2015-10-03 MED ORDER — PIVOT 1.5 CAL PO LIQD
1000.0000 mL | ORAL | Status: DC
  Administered 2015-10-03: 1000 mL

## 2015-10-03 NOTE — Progress Notes (Signed)
Patient ID: [name] [name], male   DOB: 1951-02-11, 65 y.o.   MRN: [id number] Follow up - Trauma Critical Care  Patient Details:    [name] [name] is an 65 y.o. male.  Lines/tubes : Airway 8 mm (Active)  Secured at (cm) 22 cm 10/03/2015  4:20 AM  Measured From Lips 10/03/2015  4:20 AM  Secured Location Right 10/03/2015  4:20 AM  Secured By [employer] 10/03/2015  4:20 AM  Tube Holder Repositioned Yes 10/03/2015  4:20 AM  Cuff Pressure (cm H2O) 30 cm H2O 10/02/2015  8:39 AM  Site Condition Dry 10/03/2015  4:20 AM     PICC Double Lumen [id number] PICC Right Basilic 48 cm 3 cm (Active)  Indication for Insertion or Continuance of Line Prolonged intravenous therapies 10/02/2015  8:00 PM  Exposed Catheter (cm) 3 cm 10/02/2015 12:00 PM  Site Assessment Clean;Dry;Intact 10/02/2015  8:00 PM  Lumen #1 Status Infusing 10/02/2015  8:00 PM  Lumen #2 Status Infusing 10/02/2015  8:00 PM  Dressing Type Transparent 10/02/2015  8:00 PM  Dressing Status Clean;Dry;Intact;Antimicrobial disc in place 10/02/2015  8:00 PM  Line Care Connections checked and tightened 10/02/2015  8:00 PM  Dressing Change Due 10/09/15 10/02/2015  8:00 PM     NG/OG Tube Orogastric Center mouth (Active)  Placement Verification Auscultation 10/02/2015  8:00 PM  Site Assessment Clean;Dry;Intact 10/02/2015  8:00 PM  Status Clamped 10/02/2015  8:00 PM  Drainage Appearance Yellow 10/02/2015  8:00 AM  Intake (mL) 30 mL 10/01/2015  8:00 AM  Output (mL) 0 mL 10/01/2015  8:00 AM     Urethral Catheter Venia Carbon RN  (Active)  Indication for Insertion or Continuance of Catheter Unstable critical patients (first 24-48 hours) 10/02/2015  8:00 PM  Site Assessment Clean;Intact 10/02/2015  8:00 PM  Catheter Maintenance Bag below level of bladder;Catheter secured;Drainage bag/tubing not touching floor;Insertion date on drainage bag;No dependent loops;Seal intact 10/02/2015  8:00 PM  Collection Container Standard drainage bag 10/02/2015  8:00 PM   Securement Method Leg strap 10/02/2015  8:00 PM  Urinary Catheter Interventions Unclamped 10/02/2015  8:00 AM  Output (mL) 50 mL 10/03/2015  6:00 AM    Microbiology/Sepsis markers: Results for orders placed or performed during the hospital encounter of 09/18/15  MRSA PCR Screening     Status: None   Collection Time: 09/19/15  4:46 AM  Result Value Ref Range Status   MRSA by PCR NEGATIVE NEGATIVE Final    Comment:        The GeneXpert MRSA Assay (FDA approved for NASAL specimens only), is one component of a comprehensive MRSA colonization surveillance program. It is not intended to diagnose MRSA infection nor to guide or monitor treatment for MRSA infections.   C difficile quick scan w PCR reflex     Status: None   Collection Time: 09/22/15  9:15 AM  Result Value Ref Range Status   C Diff antigen NEGATIVE NEGATIVE Final   C Diff toxin NEGATIVE NEGATIVE Final   C Diff interpretation Negative for toxigenic C. difficile  Final  Culture, respiratory (NON-Expectorated)     Status: None   Collection Time: 09/23/15  8:56 AM  Result Value Ref Range Status   Specimen Description TRACHEAL ASPIRATE  Final   Special Requests Normal  Final   Gram Stain   Final    ABUNDANT WBC PRESENT, PREDOMINANTLY PMN RARE SQUAMOUS EPITHELIAL CELLS PRESENT NO ORGANISMS SEEN Performed at [facility]   Final  NORMAL OROPHARYNGEAL FLORA Performed at [facility]    Report Status 09/25/2015 FINAL  Final    Anti-infectives:  Anti-infectives    Start     Dose/Rate Route Frequency Ordered Stop   09/20/15 1700  piperacillin-tazobactam (ZOSYN) IVPB 3.375 g     3.375 g 12.5 mL/hr over 240 Minutes Intravenous Every 8 hours 09/20/15 1540 09/26/15 2117      Best Practice/Protocols:  VTE Prophylaxis: Lovenox (prophylaxtic dose) Continous Sedation  Subjective:    Overnight Issues: stable  Objective:  Vital signs for last 24 hours: Temp:  [97.9 F (36.6 C)-99.4 F  (37.4 C)] 97.9 F (36.6 C) (01/19 0700) Pulse Rate:  [100-121] 106 (01/19 0700) Resp:  [18-33] 19 (01/19 0700) BP: (118-155)/(73-99) 118/78 mmHg (01/19 0700) SpO2:  [94 %-99 %] 96 % (01/19 0700) FiO2 (%):  [40 %] 40 % (01/19 0420) Weight:  [80.1 kg (176 lb 9.4 oz)] 80.1 kg (176 lb 9.4 oz) (01/19 0500)  Hemodynamic parameters for last 24 hours: CVP:  [6 mmHg-14 mmHg] 6 mmHg  Intake/Output from previous day: 01/18 0701 - 01/19 0700 In: 1248.3 [I.V.:1228.3; NG/GT:20] Out: 4810 [Urine:4810]  Intake/Output this shift:    Vent settings for last 24 hours: Vent Mode:  [-] PRVC FiO2 (%):  [40 %] 40 % Set Rate:  [18 bmp] 18 bmp Vt Set:  [560 mL] 560 mL PEEP:  [5 cmH20] 5 cmH20 Pressure Support:  [10 cmH20] 10 cmH20 Plateau Pressure:  [18 cmH20-23 cmH20] 19 cmH20  Physical Exam:  General: on vent Neuro: sedated but arouses and F/C HEENT/Neck: ETT Resp: some wheezing and rales CVS: RRR GI: soft, NT, small UH Extremities: edema 2+  Results for orders placed or performed during the hospital encounter of 09/18/15 (from the past 24 hour(s))  Glucose, capillary     Status: Abnormal   Collection Time: 10/02/15  1:41 PM  Result Value Ref Range   Glucose-Capillary 101 (H) 65 - 99 mg/dL   Comment 1 Capillary Specimen    Comment 2 Notify RN   Glucose, capillary     Status: Abnormal   Collection Time: 10/02/15  4:20 PM  Result Value Ref Range   Glucose-Capillary 106 (H) 65 - 99 mg/dL   Comment 1 Capillary Specimen    Comment 2 Notify RN   Glucose, capillary     Status: Abnormal   Collection Time: 10/02/15  7:19 PM  Result Value Ref Range   Glucose-Capillary 110 (H) 65 - 99 mg/dL   Comment 1 Capillary Specimen    Comment 2 Notify RN    Comment 3 Document in Chart   Glucose, capillary     Status: None   Collection Time: 10/02/15 11:36 PM  Result Value Ref Range   Glucose-Capillary 90 65 - 99 mg/dL   Comment 1 Capillary Specimen    Comment 2 Notify RN    Comment 3 Document in  Chart   Glucose, capillary     Status: None   Collection Time: 10/03/15  3:42 AM  Result Value Ref Range   Glucose-Capillary 78 65 - 99 mg/dL   Comment 1 Capillary Specimen    Comment 2 Notify RN    Comment 3 Document in Chart   CBC     Status: Abnormal   Collection Time: 10/03/15  4:30 AM  Result Value Ref Range   WBC 9.1 4.0 - 10.5 K/uL   RBC 2.98 (L) 4.22 - 5.81 MIL/uL   Hemoglobin 8.9 (L) 13.0 - 17.0 g/dL  HCT 28.0 (L) 39.0 - 52.0 %   MCV 94.0 78.0 - 100.0 fL   MCH 29.9 26.0 - 34.0 pg   MCHC 31.8 30.0 - 36.0 g/dL   RDW 14.3 11.5 - 15.5 %   Platelets 672 (H) 150 - 400 K/uL  Basic metabolic panel     Status: Abnormal   Collection Time: 10/03/15  4:30 AM  Result Value Ref Range   Sodium 138 135 - 145 mmol/L   Potassium 3.8 3.5 - 5.1 mmol/L   Chloride 102 101 - 111 mmol/L   CO2 30 22 - 32 mmol/L   Glucose, Bld 94 65 - 99 mg/dL   BUN 8 6 - 20 mg/dL   Creatinine, Ser 0.62 0.61 - 1.24 mg/dL   Calcium 8.6 (L) 8.9 - 10.3 mg/dL   GFR calc non Af Amer >60 >60 mL/min   GFR calc Af Amer >60 >60 mL/min   Anion gap 6 5 - 15    Assessment & Plan: Present on Admission:  . Fracture of multiple ribs . Multiple rib fractures   LOS: 15 days   Additional comments:I reviewed the patient's new clinical lab test results. . [facility] Multiple left rib fxs w/pulmonary contusions - L CT out and no PTX Left renal contusion ARF w/ARDS - continue weaning ABL anemia - stable Hypernatremia - improved, decrease free water again Penile pressure wound - WOC eval FEN - try TF at 10/h VTE - SCD's, Lovenox Dispo - ICU Critical Care Total Time*: 32 Minutes  Georganna Skeans, MD, MPH, FACS Trauma: [phone] General Surgery: [phone]  10/03/2015  *Care during the described time interval was provided by me. I have reviewed this patient's available data, including medical history, events of note, physical examination and test results as part of my evaluation.

## 2015-10-03 NOTE — Care Management (Signed)
UR updated.  

## 2015-10-04 ENCOUNTER — Inpatient Hospital Stay (HOSPITAL_COMMUNITY): Payer: Non-veteran care

## 2015-10-04 LAB — GLUCOSE, CAPILLARY
GLUCOSE-CAPILLARY: 114 mg/dL — AB (ref 65–99)
GLUCOSE-CAPILLARY: 102 mg/dL — AB (ref 65–99)
Glucose-Capillary: 100 mg/dL — ABNORMAL HIGH (ref 65–99)
Glucose-Capillary: 101 mg/dL — ABNORMAL HIGH (ref 65–99)
Glucose-Capillary: 102 mg/dL — ABNORMAL HIGH (ref 65–99)
Glucose-Capillary: 96 mg/dL (ref 65–99)

## 2015-10-04 LAB — BASIC METABOLIC PANEL WITH GFR
Anion gap: 10 (ref 5–15)
BUN: 9 mg/dL (ref 6–20)
CO2: 28 mmol/L (ref 22–32)
Calcium: 8.1 mg/dL — ABNORMAL LOW (ref 8.9–10.3)
Chloride: 98 mmol/L — ABNORMAL LOW (ref 101–111)
Creatinine, Ser: 0.56 mg/dL — ABNORMAL LOW (ref 0.61–1.24)
GFR calc Af Amer: >60 mL/min
GFR calc non Af Amer: >60 mL/min
Glucose, Bld: 108 mg/dL — ABNORMAL HIGH (ref 65–99)
Potassium: 3.8 mmol/L (ref 3.5–5.1)
Sodium: 136 mmol/L (ref 135–145)

## 2015-10-04 LAB — TRIGLYCERIDES: TRIGLYCERIDES: 787 mg/dL — AB (ref <150)

## 2015-10-04 MED ORDER — PIVOT 1.5 CAL PO LIQD
1000.0000 mL | ORAL | Status: DC
  Administered 2015-10-04: 1000 mL
  Filled 2015-10-04: qty 1000

## 2015-10-04 MED ORDER — FUROSEMIDE 10 MG/ML IJ SOLN
40.0000 mg | Freq: Once | INTRAMUSCULAR | Status: AC
  Administered 2015-10-04: 40 mg via INTRAVENOUS
  Filled 2015-10-04: qty 4

## 2015-10-04 MED ORDER — CLONAZEPAM 0.5 MG PO TBDP
1.0000 mg | ORAL_TABLET | Freq: Two times a day (BID) | ORAL | Status: DC
  Administered 2015-10-04 – 2015-10-09 (×11): 1 mg
  Filled 2015-10-04 (×12): qty 2
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
## 2015-10-04 NOTE — Clinical Social Work Note (Signed)
CSW left message with daughter in regards to consult received about paying patient's rent. Unfortunately CSW will not be able to assist daughter in accessing patient's bank account. The daughter would have to have been designated the patient's financial POA or given access to the account prior to hospitalization.    Liz Beach MSW, [place], [place], [id number]

## 2015-10-05 LAB — GLUCOSE, CAPILLARY
GLUCOSE-CAPILLARY: 127 mg/dL — AB (ref 65–99)
Glucose-Capillary: 105 mg/dL — ABNORMAL HIGH (ref 65–99)
Glucose-Capillary: 112 mg/dL — ABNORMAL HIGH (ref 65–99)
Glucose-Capillary: 118 mg/dL — ABNORMAL HIGH (ref 65–99)
Glucose-Capillary: 130 mg/dL — ABNORMAL HIGH (ref 65–99)
Glucose-Capillary: 153 mg/dL — ABNORMAL HIGH (ref 65–99)

## 2015-10-05 LAB — CBC
HEMATOCRIT: 27 % — AB (ref 39.0–52.0)
Hemoglobin: 9.3 g/dL — ABNORMAL LOW (ref 13.0–17.0)
MCH: 32.9 pg (ref 26.0–34.0)
MCHC: 34.4 g/dL (ref 30.0–36.0)
MCV: 95.4 fL (ref 78.0–100.0)
PLATELETS: 579 10*3/uL — AB (ref 150–400)
RBC: 2.83 MIL/uL — ABNORMAL LOW (ref 4.22–5.81)
RDW: 14.3 % (ref 11.5–15.5)
WBC: 7.9 10*3/uL (ref 4.0–10.5)

## 2015-10-05 LAB — BASIC METABOLIC PANEL
Anion gap: 7 (ref 5–15)
BUN: 11 mg/dL (ref 6–20)
CO2: 28 mmol/L (ref 22–32)
CREATININE: 0.38 mg/dL — AB (ref 0.61–1.24)
Calcium: 7.6 mg/dL — ABNORMAL LOW (ref 8.9–10.3)
Chloride: 96 mmol/L — ABNORMAL LOW (ref 101–111)
GFR calc Af Amer: >60 mL/min (ref >60)
GLUCOSE: 100 mg/dL — AB (ref 65–99)
POTASSIUM: 3.9 mmol/L (ref 3.5–5.1)
SODIUM: 131 mmol/L — AB (ref 135–145)

## 2015-10-05 MED ORDER — SODIUM CHLORIDE 0.9 % IV SOLN
0.0000 mg/h | INTRAVENOUS | Status: DC
  Administered 2015-10-05: 2 mg/h via INTRAVENOUS
  Administered 2015-10-05: 4 mg/h via INTRAVENOUS
  Administered 2015-10-06: 3 mg/h via INTRAVENOUS
  Administered 2015-10-07 (×2): 4 mg/h via INTRAVENOUS
  Administered 2015-10-08: 6 mg/h via INTRAVENOUS
  Administered 2015-10-08: 5 mg/h via INTRAVENOUS
  Administered 2015-10-09 – 2015-10-10 (×4): 8 mg/h via INTRAVENOUS
  Administered 2015-10-10: 4 mg/h via INTRAVENOUS
  Administered 2015-10-12: 2 mg/h via INTRAVENOUS
  Filled 2015-10-05 (×14): qty 10

## 2015-10-05 MED ORDER — MIDAZOLAM BOLUS VIA INFUSION
1.0000 mg | INTRAVENOUS | Status: DC | PRN
  Filled 2015-10-05: qty 2

## 2015-10-05 MED ORDER — GUAIFENESIN 100 MG/5ML PO SOLN
5.0000 mL | Freq: Four times a day (QID) | ORAL | Status: DC
  Administered 2015-10-05 – 2015-11-05 (×117): 100 mg
  Filled 2015-10-05 (×118): qty 5

## 2015-10-05 MED ORDER — PIVOT 1.5 CAL PO LIQD
1000.0000 mL | ORAL | Status: DC
  Administered 2015-10-05 – 2015-11-05 (×31): 1000 mL
  Filled 2015-10-05 (×33): qty 1000

## 2015-10-05 NOTE — Progress Notes (Signed)
Pt transported to [id number] with no complications noted.

## 2015-10-05 NOTE — Progress Notes (Signed)
Patient ID: [name] [name], male   DOB: 09-Mar-1951, 65 y.o.   MRN: [id number] Follow up - Trauma Critical Care  Patient Details:    [name] [name] is an 65 y.o. male.  Lines/tubes : Airway 8 mm (Active)  Secured at (cm) 22 cm 10/05/2015  8:21 AM  Measured From Lips 10/05/2015  8:21 AM  Secured Location Center 10/05/2015  8:21 AM  Secured By [employer] 10/05/2015  8:21 AM  Tube Holder Repositioned Yes 10/05/2015  8:21 AM  Cuff Pressure (cm H2O) 26 cm H2O 10/05/2015  3:56 AM  Site Condition Dry 10/05/2015  8:21 AM     PICC Double Lumen [id number] PICC Right Basilic 48 cm 3 cm (Active)  Indication for Insertion or Continuance of Line Prolonged intravenous therapies 10/05/2015  8:00 AM  Exposed Catheter (cm) 3 cm 10/02/2015 12:00 PM  Site Assessment Clean;Dry;Intact 10/05/2015  8:00 AM  Lumen #1 Status Infusing 10/05/2015  8:00 AM  Lumen #2 Status Infusing 10/05/2015  8:00 AM  Dressing Type Transparent;Occlusive;Gauze 10/05/2015  8:00 AM  Dressing Status Clean;Dry;Intact;Antimicrobial disc in place 10/05/2015  8:00 AM  Line Care Connections checked and tightened 10/05/2015  8:00 AM  Dressing Change Due 10/09/15 10/04/2015  8:00 PM     NG/OG Tube Orogastric Center mouth (Active)  Placement Verification Auscultation 10/05/2015  8:00 AM  Site Assessment Clean;Dry;Intact 10/05/2015  8:00 AM  Status Infusing tube feed 10/05/2015  8:00 AM  Drainage Appearance Yellow 10/02/2015  8:00 AM  Intake (mL) 30 mL 10/05/2015  4:00 AM  Output (mL) 0 mL 10/04/2015  8:00 PM     Urethral Catheter Venia Carbon RN  (Active)  Indication for Insertion or Continuance of Catheter Aggressive IV diuresis;Unstable critical patients (first 24-48 hours) 10/05/2015  8:00 AM  Site Assessment Clean;Intact 10/05/2015  8:00 AM  Catheter Maintenance Bag below level of bladder;Catheter secured;Drainage bag/tubing not touching floor;Insertion date on drainage bag;No dependent loops;Seal intact 10/05/2015  8:00 AM  Collection  Container Standard drainage bag 10/05/2015  8:00 AM  Securement Method Leg strap 10/05/2015  8:00 AM  Urinary Catheter Interventions Unclamped 10/05/2015  8:00 AM  Output (mL) 315 mL 10/05/2015  9:00 AM    Microbiology/Sepsis markers: Results for orders placed or performed during the hospital encounter of 09/18/15  MRSA PCR Screening     Status: None   Collection Time: 09/19/15  4:46 AM  Result Value Ref Range Status   MRSA by PCR NEGATIVE NEGATIVE Final    Comment:        The GeneXpert MRSA Assay (FDA approved for NASAL specimens only), is one component of a comprehensive MRSA colonization surveillance program. It is not intended to diagnose MRSA infection nor to guide or monitor treatment for MRSA infections.   C difficile quick scan w PCR reflex     Status: None   Collection Time: 09/22/15  9:15 AM  Result Value Ref Range Status   C Diff antigen NEGATIVE NEGATIVE Final   C Diff toxin NEGATIVE NEGATIVE Final   C Diff interpretation Negative for toxigenic C. difficile  Final  Culture, respiratory (NON-Expectorated)     Status: None   Collection Time: 09/23/15  8:56 AM  Result Value Ref Range Status   Specimen Description TRACHEAL ASPIRATE  Final   Special Requests Normal  Final   Gram Stain   Final    ABUNDANT WBC PRESENT, PREDOMINANTLY PMN RARE SQUAMOUS EPITHELIAL CELLS PRESENT NO ORGANISMS SEEN Performed at [employer] [facility]  Final    NORMAL OROPHARYNGEAL FLORA Performed at [facility]    Report Status 09/25/2015 FINAL  Final    Anti-infectives:  Anti-infectives    Start     Dose/Rate Route Frequency Ordered Stop   09/20/15 1700  piperacillin-tazobactam (ZOSYN) IVPB 3.375 g     3.375 g 12.5 mL/hr over 240 Minutes Intravenous Every 8 hours 09/20/15 1540 09/26/15 2117      Best Practice/Protocols:  VTE Prophylaxis: Lovenox (prophylaxtic dose) Continous Sedation  Subjective:    Overnight Issues:  A lot more secretions Objective:   Vital signs for last 24 hours: Temp:  [98.5 F (36.9 C)-99.3 F (37.4 C)] 98.9 F (37.2 C) (01/21 0700) Pulse Rate:  [104-119] 109 (01/21 0800) Resp:  [17-43] 19 (01/21 0800) BP: (113-156)/(77-100) 119/82 mmHg (01/21 0800) SpO2:  [90 %-99 %] 99 % (01/21 0821) FiO2 (%):  [40 %] 40 % (01/21 0821) Weight:  [74.4 kg (164 lb 0.4 oz)] 74.4 kg (164 lb 0.4 oz) (01/21 0400)  Hemodynamic parameters for last 24 hours: CVP:  [4 mmHg-17 mmHg] 17 mmHg  Intake/Output from previous day: 01/20 0701 - 01/21 0700 In: 1725.3 [I.V.:1205.3; NG/GT:520] Out: 3465 [Urine:3465]  Intake/Output this shift: Total I/O In: 71.1 [I.V.:51.1; NG/GT:20] Out: 315 [Urine:315]  Vent settings for last 24 hours: Vent Mode:  [-] PSV;CPAP FiO2 (%):  [40 %] 40 % Set Rate:  [18 bmp] 18 bmp Vt Set:  [560 mL] 560 mL PEEP:  [5 cmH20] 5 cmH20 Pressure Support:  [5 cmH20] 5 cmH20 Plateau Pressure:  [22 cmH20] 22 cmH20  Physical Exam:  General: on vent Neuro: arouses, follows some commands HEENT/Neck: ETT Resp: rhonchi bilaterally CVS: RRR GI: soft, NT, ND Extremities: calves soft  Results for orders placed or performed during the hospital encounter of 09/18/15 (from the past 24 hour(s))  Glucose, capillary     Status: Abnormal   Collection Time: 10/04/15 11:18 AM  Result Value Ref Range   Glucose-Capillary 114 (H) 65 - 99 mg/dL   Comment 1 Capillary Specimen    Comment 2 Notify RN   Glucose, capillary     Status: Abnormal   Collection Time: 10/04/15  3:50 PM  Result Value Ref Range   Glucose-Capillary 102 (H) 65 - 99 mg/dL   Comment 1 Capillary Specimen    Comment 2 Notify RN   Glucose, capillary     Status: Abnormal   Collection Time: 10/04/15  8:30 PM  Result Value Ref Range   Glucose-Capillary 101 (H) 65 - 99 mg/dL   Comment 1 Capillary Specimen   Glucose, capillary     Status: Abnormal   Collection Time: 10/05/15 12:59 AM  Result Value Ref Range   Glucose-Capillary 112 (H) 65 - 99 mg/dL    Comment 1 Capillary Specimen    Comment 2 Notify RN   Glucose, capillary     Status: Abnormal   Collection Time: 10/05/15  3:39 AM  Result Value Ref Range   Glucose-Capillary 105 (H) 65 - 99 mg/dL   Comment 1 Capillary Specimen    Comment 2 Notify RN   Basic metabolic panel     Status: Abnormal   Collection Time: 10/05/15  4:13 AM  Result Value Ref Range   Sodium 131 (L) 135 - 145 mmol/L   Potassium 3.9 3.5 - 5.1 mmol/L   Chloride 96 (L) 101 - 111 mmol/L   CO2 28 22 - 32 mmol/L   Glucose, Bld 100 (H) 65 - 99 mg/dL  BUN 11 6 - 20 mg/dL   Creatinine, Ser 0.38 (L) 0.61 - 1.24 mg/dL   Calcium 7.6 (L) 8.9 - 10.3 mg/dL   GFR calc non Af Amer >60 >60 mL/min   GFR calc Af Amer >60 >60 mL/min   Anion gap 7 5 - 15  CBC     Status: Abnormal   Collection Time: 10/05/15  4:13 AM  Result Value Ref Range   WBC 7.9 4.0 - 10.5 K/uL   RBC 2.83 (L) 4.22 - 5.81 MIL/uL   Hemoglobin 9.3 (L) 13.0 - 17.0 g/dL   HCT 27.0 (L) 39.0 - 52.0 %   MCV 95.4 78.0 - 100.0 fL   MCH 32.9 26.0 - 34.0 pg   MCHC 34.4 30.0 - 36.0 g/dL   RDW 14.3 11.5 - 15.5 %   Platelets 579 (H) 150 - 400 K/uL    Assessment & Plan: Present on Admission:  . Fracture of multiple ribs . Multiple rib fractures   LOS: 17 days   Additional comments:I reviewed the patient's new clinical lab test results. . [facility] Multiple left rib fxs w/pulmonary contusions - L CT out and no PTX Left renal contusion ARF w/ARDS - weaning very well but has a lot more secretions this AM. Add guaifenesin. Hold off on extubation today. ABL anemia - stable Hyponatremia - free water off now, F/U Penile pressure wound - appreciate WOC eval FEN - increase TF to goal VTE - SCD's, Lovenox Dispo - ICU Critical Care Total Time*: 30 Minutes  Georganna Skeans, MD, MPH, FACS Trauma: [phone] General Surgery: [phone]  10/05/2015  *Care during the described time interval was provided by me. I have reviewed this patient's available data, including  medical history, events of note, physical examination and test results as part of my evaluation.

## 2015-10-06 ENCOUNTER — Inpatient Hospital Stay (HOSPITAL_COMMUNITY): Payer: Non-veteran care

## 2015-10-06 LAB — CBC
HCT: 29.1 % — ABNORMAL LOW (ref 39.0–52.0)
Hemoglobin: 9.4 g/dL — ABNORMAL LOW (ref 13.0–17.0)
MCH: 30.7 pg (ref 26.0–34.0)
MCHC: 32.3 g/dL (ref 30.0–36.0)
MCV: 95.1 fL (ref 78.0–100.0)
Platelets: 641 10*3/uL — ABNORMAL HIGH (ref 150–400)
RBC: 3.06 MIL/uL — ABNORMAL LOW (ref 4.22–5.81)
RDW: 14.2 % (ref 11.5–15.5)
WBC: 9.9 10*3/uL (ref 4.0–10.5)

## 2015-10-06 LAB — GLUCOSE, CAPILLARY
GLUCOSE-CAPILLARY: 141 mg/dL — AB (ref 65–99)
GLUCOSE-CAPILLARY: 131 mg/dL — AB (ref 65–99)
GLUCOSE-CAPILLARY: 137 mg/dL — AB (ref 65–99)
Glucose-Capillary: 150 mg/dL — ABNORMAL HIGH (ref 65–99)
Glucose-Capillary: 138 mg/dL — ABNORMAL HIGH (ref 65–99)
Glucose-Capillary: 153 mg/dL — ABNORMAL HIGH (ref 65–99)
Glucose-Capillary: 142 mg/dL — ABNORMAL HIGH (ref 65–99)

## 2015-10-06 LAB — BASIC METABOLIC PANEL
Anion gap: 4 — ABNORMAL LOW (ref 5–15)
BUN: 12 mg/dL (ref 6–20)
CALCIUM: 9.3 mg/dL (ref 8.9–10.3)
CHLORIDE: 103 mmol/L (ref 101–111)
CO2: 33 mmol/L — AB (ref 22–32)
CREATININE: 0.54 mg/dL — AB (ref 0.61–1.24)
GFR calc non Af Amer: >60 mL/min (ref >60)
Glucose, Bld: 153 mg/dL — ABNORMAL HIGH (ref 65–99)
Potassium: 4 mmol/L (ref 3.5–5.1)
SODIUM: 140 mmol/L (ref 135–145)

## 2015-10-06 MED ORDER — ANTISEPTIC ORAL RINSE SOLUTION (CORINZ)
7.0000 mL | OROMUCOSAL | Status: DC
  Administered 2015-10-06 – 2015-10-14 (×86): 7 mL via OROMUCOSAL

## 2015-10-06 MED ORDER — CHLORHEXIDINE GLUCONATE 0.12% ORAL RINSE (MEDLINE KIT)
15.0000 mL | Freq: Two times a day (BID) | OROMUCOSAL | Status: DC
  Administered 2015-10-06 – 2015-11-07 (×53): 15 mL via OROMUCOSAL

## 2015-10-06 NOTE — Progress Notes (Signed)
  Subjective: Remains intubated; still suctioning thick secretions Not following commands very well  Objective: Vital signs in last 24 hours: Temp:  [98.3 F (36.8 C)-100.4 F (38 C)] 98.9 F (37.2 C) (01/22 0400) Pulse Rate:  [109-125] 116 (01/22 0700) Resp:  [19-37] 25 (01/22 0700) BP: (112-160)/(80-114) 136/88 mmHg (01/22 0700) SpO2:  [95 %-100 %] 95 % (01/22 0700) FiO2 (%):  [40 %] 40 % (01/22 0400) Weight:  [76 kg (167 lb 8.8 oz)] 76 kg (167 lb 8.8 oz) (01/22 0300) Last BM Date: 10/05/15  Intake/Output from previous day: 01/21 0701 - 01/22 0700 In: 2091.2 [I.V.:681.2; NG/GT:1350] Out: 3110 [Urine:3110] Intake/Output this shift:    General: on vent;  Neuro: arouses, follows occasional commands HEENT/Neck: ETT Resp: rhonchi bilaterally CVS: RRR GI: soft, NT, ND Penis - foley in; some scrotal edema, but relatively soft; some areas of penile shaft without pigment, but no open wounds Extremities: calves soft  Lab Results:   Recent Labs  10/05/15 0413 10/06/15 0435  WBC 7.9 9.9  HGB 9.3* 9.4*  HCT 27.0* 29.1*  PLT 579* 641*   BMET  Recent Labs  10/05/15 0413 10/06/15 0435  NA 131* 140  K 3.9 4.0  CL 96* 103  CO2 28 33*  GLUCOSE 100* 153*  BUN 11 12  CREATININE 0.38* 0.54*  CALCIUM 7.6* 9.3   PT/INR No results for input(s): LABPROT, INR in the last 72 hours. ABG No results for input(s): PHART, HCO3 in the last 72 hours.  Invalid input(s): PCO2, PO2  Studies/Results: No results found.  Anti-infectives: Anti-infectives    Start     Dose/Rate Route Frequency Ordered Stop   09/20/15 1700  piperacillin-tazobactam (ZOSYN) IVPB 3.375 g     3.375 g 12.5 mL/hr over 240 Minutes Intravenous Every 8 hours 09/20/15 1540 09/26/15 2117      Assessment/Plan:  [facility] Multiple left rib fxs w/pulmonary contusions - L CT out and no PTX Left renal contusion ARF w/ARDS - weaning very well but still with copious thick secretions this AM. Add guaifenesin.  Continue vent wean - possible extubation tomorrow ABL anemia - stable Hyponatremia - free water off now, F/U Penile pressure wound - appreciate WOC eval FEN - tube feeds at goal; D/C Foley - condom cath VTE - SCD's, Lovenox Dispo - ICU  LOS: 18 days    Corwin Kuiken K. 10/06/2015

## 2015-10-07 ENCOUNTER — Inpatient Hospital Stay (HOSPITAL_COMMUNITY): Payer: Non-veteran care

## 2015-10-07 ENCOUNTER — Inpatient Hospital Stay (HOSPITAL_COMMUNITY): Payer: Non-veteran care | Admitting: Certified Registered"

## 2015-10-07 LAB — GLUCOSE, CAPILLARY
GLUCOSE-CAPILLARY: 142 mg/dL — AB (ref 65–99)
GLUCOSE-CAPILLARY: 116 mg/dL — AB (ref 65–99)
GLUCOSE-CAPILLARY: 113 mg/dL — AB (ref 65–99)
Glucose-Capillary: 133 mg/dL — ABNORMAL HIGH (ref 65–99)
Glucose-Capillary: 119 mg/dL — ABNORMAL HIGH (ref 65–99)

## 2015-10-07 MED ORDER — SUCCINYLCHOLINE CHLORIDE 20 MG/ML IJ SOLN
INTRAMUSCULAR | Status: DC | PRN
  Administered 2015-10-07: 80 mg via INTRAVENOUS

## 2015-10-07 MED ORDER — PROPOFOL 10 MG/ML IV BOLUS
INTRAVENOUS | Status: DC | PRN
  Administered 2015-10-07: 120 mg via INTRAVENOUS

## 2015-10-07 NOTE — Anesthesia Procedure Notes (Signed)
Procedure Name: Intubation Date/Time: 10/07/2015 11:19 AM Performed by: Melina Copa, Tami Blass R Pre-anesthesia Checklist: Patient identified, Emergency Drugs available, Suction available, Patient being monitored and Timeout performed Patient Re-evaluated:Patient Re-evaluated prior to inductionOxygen Delivery Method: Circle system utilized Preoxygenation: Pre-oxygenation with 100% oxygen Intubation Type: IV induction Ventilation: Mask ventilation without difficulty Laryngoscope Size: Mac and 4 Grade View: Grade II Tube type: Subglottic suction tube Tube size: 7.5 mm Number of attempts: 1 Airway Equipment and Method: Stylet Placement Confirmation: ETT inserted through vocal cords under direct vision,  CO2 detector and breath sounds checked- equal and bilateral Secured at: 21 cm Tube secured with: Tape Dental Injury: Teeth and Oropharynx as per pre-operative assessment

## 2015-10-07 NOTE — Procedures (Signed)
Extubation Procedure Note  Patient Details:   Name: [name] [name] DOB: 1951-02-25 MRN: [id number]   Airway Documentation:     Evaluation  O2 sats: stable throughout Complications: No apparent complications Patient did tolerate procedure well. Bilateral Breath Sounds: Rhonchi Suctioning: Airway, Oral No  PT was extubated to a 2L [place]  Sats were stable. PT was unable to say his name  [name] [name] 10/07/2015, 10:13 AM
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
## 2015-10-07 NOTE — Transfer of Care (Signed)
Immediate Anesthesia Transfer of Care Note  Patient: [name] [name]  Procedure(s) Performed: * No procedures listed *  Patient Location: ICU  Anesthesia Type:General  Level of Consciousness: sedated  Airway & Oxygen Therapy: Patient remains intubated per anesthesia plan and Patient placed on Ventilator (see vital sign flow sheet for setting)  Post-op Assessment: Report given to RN and Post -op Vital signs reviewed and stable  Post vital signs: Reviewed and stable  Last Vitals:  Filed Vitals:   10/07/15 0900 10/07/15 1140  BP: 116/81   Pulse: 117 122  Temp:    Resp: 23 26    Complications: No apparent anesthesia complications

## 2015-10-07 NOTE — Progress Notes (Signed)
Patient ID: [name] [name], male   DOB: 04/06/1951, 65 y.o.   MRN: [id number] Follow up - Trauma Critical Care  Patient Details:    [name] [name] is an 65 y.o. male.  Lines/tubes : Airway 8 mm (Active)  Secured at (cm) 22 cm 10/07/2015  7:53 AM  Measured From Lips 10/07/2015  7:53 AM  Secured Location Left 10/07/2015  7:53 AM  Secured By [employer] 10/07/2015  7:53 AM  Tube Holder Repositioned Yes 10/07/2015  7:53 AM  Cuff Pressure (cm H2O) 26 cm H2O 10/06/2015  7:53 PM  Site Condition Dry 10/07/2015  7:53 AM     PICC Double Lumen [id number] PICC Right Basilic 48 cm 3 cm (Active)  Indication for Insertion or Continuance of Line Prolonged intravenous therapies 10/06/2015  8:00 PM  Exposed Catheter (cm) 3 cm 10/02/2015 12:00 PM  Site Assessment Clean;Dry;Intact 10/06/2015  8:00 PM  Lumen #1 Status Infusing 10/06/2015  8:00 PM  Lumen #2 Status Infusing 10/06/2015  8:00 PM  Dressing Type Transparent;Gauze 10/06/2015  8:00 PM  Dressing Status Clean;Dry;Intact 10/06/2015  8:00 PM  Line Care Connections checked and tightened 10/06/2015  8:00 PM  Dressing Change Due 10/09/15 10/06/2015  8:00 PM     NG/OG Tube Orogastric Center mouth (Active)  Placement Verification Auscultation 10/06/2015  8:00 PM  Site Assessment Clean;Dry;Intact 10/06/2015  8:00 PM  Status Infusing tube feed 10/06/2015  8:00 PM  Drainage Appearance Yellow 10/02/2015  8:00 AM  Intake (mL) 30 mL 10/05/2015  8:00 PM  Output (mL) 0 mL 10/04/2015  8:00 PM     External Urinary Catheter (Active)  Collection Container Standard drainage bag 10/06/2015  8:00 PM  Securement Method Securing device (Describe) 10/06/2015  8:00 PM  Output (mL) 450 mL 10/07/2015  7:55 AM    Microbiology/Sepsis markers: Results for orders placed or performed during the hospital encounter of 09/18/15  MRSA PCR Screening     Status: None   Collection Time: 09/19/15  4:46 AM  Result Value Ref Range Status   MRSA by PCR NEGATIVE NEGATIVE Final    Comment:         The GeneXpert MRSA Assay (FDA approved for NASAL specimens only), is one component of a comprehensive MRSA colonization surveillance program. It is not intended to diagnose MRSA infection nor to guide or monitor treatment for MRSA infections.   C difficile quick scan w PCR reflex     Status: None   Collection Time: 09/22/15  9:15 AM  Result Value Ref Range Status   C Diff antigen NEGATIVE NEGATIVE Final   C Diff toxin NEGATIVE NEGATIVE Final   C Diff interpretation Negative for toxigenic C. difficile  Final  Culture, respiratory (NON-Expectorated)     Status: None   Collection Time: 09/23/15  8:56 AM  Result Value Ref Range Status   Specimen Description TRACHEAL ASPIRATE  Final   Special Requests Normal  Final   Gram Stain   Final    ABUNDANT WBC PRESENT, PREDOMINANTLY PMN RARE SQUAMOUS EPITHELIAL CELLS PRESENT NO ORGANISMS SEEN Performed at [facility]    Culture   Final    NORMAL OROPHARYNGEAL FLORA Performed at [facility]    Report Status 09/25/2015 FINAL  Final    Anti-infectives:  Anti-infectives    Start     Dose/Rate Route Frequency Ordered Stop   09/20/15 1700  piperacillin-tazobactam (ZOSYN) IVPB 3.375 g     3.375 g 12.5 mL/hr over 240 Minutes Intravenous Every 8 hours 09/20/15  1540 09/26/15 2117      Best Practice/Protocols:  VTE Prophylaxis: Lovenox (prophylaxtic dose) Intermittent Sedation  Subjective:    Overnight Issues: stable  Objective:  Vital signs for last 24 hours: Temp:  [97.7 F (36.5 C)-99.2 F (37.3 C)] 98 F (36.7 C) (01/23 0400) Pulse Rate:  [116-131] 123 (01/23 0753) Resp:  [20-31] 31 (01/23 0753) BP: (117-160)/(72-108) 132/81 mmHg (01/23 0630) SpO2:  [95 %-99 %] 99 % (01/23 0753) FiO2 (%):  [40 %] 40 % (01/23 0753) Weight:  [73.5 kg (162 lb 0.6 oz)] 73.5 kg (162 lb 0.6 oz) (01/23 0200)  Hemodynamic parameters for last 24 hours: CVP:  [2 mmHg-16 mmHg] 3 mmHg  Intake/Output from previous day: 01/22  0701 - 01/23 0700 In: 2019.3 [I.V.:639.3; NG/GT:1380] Out: 1510 [Urine:1510]  Intake/Output this shift: Total I/O In: -  Out: 450 [Urine:450]  Vent settings for last 24 hours: Vent Mode:  [-] CPAP;PSV FiO2 (%):  [40 %] 40 % Set Rate:  [18 bmp] 18 bmp Vt Set:  [560 mL] 560 mL PEEP:  [5 cmH20] 5 cmH20 Pressure Support:  [10 cmH20] 10 cmH20 Plateau Pressure:  [19 cmH20-20 cmH20] 20 cmH20  Physical Exam:  General: on vent Neuro: arouses and F/C HEENT/Neck: ETT Resp: few rhonchi CVS: RRR GI: soft, NT Extremities: mild edema  Results for orders placed or performed during the hospital encounter of 09/18/15 (from the past 24 hour(s))  Glucose, capillary     Status: Abnormal   Collection Time: 10/06/15 11:37 AM  Result Value Ref Range   Glucose-Capillary 153 (H) 65 - 99 mg/dL  Glucose, capillary     Status: Abnormal   Collection Time: 10/06/15  3:50 PM  Result Value Ref Range   Glucose-Capillary 131 (H) 65 - 99 mg/dL  Glucose, capillary     Status: Abnormal   Collection Time: 10/06/15  7:57 PM  Result Value Ref Range   Glucose-Capillary 142 (H) 65 - 99 mg/dL  Glucose, capillary     Status: Abnormal   Collection Time: 10/06/15 11:15 PM  Result Value Ref Range   Glucose-Capillary 137 (H) 65 - 99 mg/dL  Glucose, capillary     Status: Abnormal   Collection Time: 10/07/15  3:43 AM  Result Value Ref Range   Glucose-Capillary 142 (H) 65 - 99 mg/dL    Assessment & Plan: Present on Admission:  . Fracture of multiple ribs . Multiple rib fractures   LOS: 19 days   Additional comments:I reviewed the patient's new clinical lab test results. . [facility] Multiple left rib fxs w/pulmonary contusions - L CT out and no PTX Left renal contusion ARF w/ARDS - weaning very well, extubate this AM ABL anemia - stable Hyponatremia - resolved Penile pressure wound - appreciate WOC eval FEN - hold TF for extubation VTE - SCD's, Lovenox Dispo - ICU Critical Care Total Time*: 30  Minutes  Georganna Skeans, MD, MPH, FACS Trauma: [phone] General Surgery: [phone]  10/07/2015  *Care during the described time interval was provided by me. I have reviewed this patient's available data, including medical history, events of note, physical examination and test results as part of my evaluation.

## 2015-10-07 NOTE — Progress Notes (Signed)
Patient ID: [name] [name], male   DOB: Feb 06, 1951, 65 y.o.   MRN: [id number] Sats OK but WOB has increased significantly. RR 44. He will tire and not tolerate this for too long. Will get re-intubated now. Will plan trach this week. Georganna Skeans, MD, MPH, FACS Trauma: [phone] General Surgery: [phone]

## 2015-10-07 NOTE — Progress Notes (Signed)
SLP Cancellation Note  [name] MRN: [id number] DOB: 02-Feb-1951   Cancelled treatment:       Reason Eval/Treat Not Completed: Other (comment). Patient just extubated after prolonged intubation. Will f/u 1/24 to maximize safety of swallow.   [place], CCC-SLP [phone]    Irvine Glorioso Meryl 10/07/2015, 10:14 AM

## 2015-10-08 LAB — GLUCOSE, CAPILLARY
Glucose-Capillary: 107 mg/dL — ABNORMAL HIGH (ref 65–99)
Glucose-Capillary: 108 mg/dL — ABNORMAL HIGH (ref 65–99)
Glucose-Capillary: 116 mg/dL — ABNORMAL HIGH (ref 65–99)
Glucose-Capillary: 120 mg/dL — ABNORMAL HIGH (ref 65–99)
Glucose-Capillary: 120 mg/dL — ABNORMAL HIGH (ref 65–99)
Glucose-Capillary: 137 mg/dL — ABNORMAL HIGH (ref 65–99)
Glucose-Capillary: 144 mg/dL — ABNORMAL HIGH (ref 65–99)

## 2015-10-08 LAB — CBC
HCT: 30.4 % — ABNORMAL LOW (ref 39.0–52.0)
Hemoglobin: 9.5 g/dL — ABNORMAL LOW (ref 13.0–17.0)
MCH: 29.8 pg (ref 26.0–34.0)
MCHC: 31.3 g/dL (ref 30.0–36.0)
MCV: 95.3 fL (ref 78.0–100.0)
PLATELETS: 547 10*3/uL — AB (ref 150–400)
RBC: 3.19 MIL/uL — ABNORMAL LOW (ref 4.22–5.81)
RDW: 14.2 % (ref 11.5–15.5)
WBC: 9.8 10*3/uL (ref 4.0–10.5)

## 2015-10-08 LAB — BASIC METABOLIC PANEL
Anion gap: 7 (ref 5–15)
BUN: 16 mg/dL (ref 6–20)
CALCIUM: 9.5 mg/dL (ref 8.9–10.3)
CHLORIDE: 102 mmol/L (ref 101–111)
CO2: 31 mmol/L (ref 22–32)
CREATININE: 0.58 mg/dL — AB (ref 0.61–1.24)
GFR calc non Af Amer: >60 mL/min (ref >60)
Glucose, Bld: 149 mg/dL — ABNORMAL HIGH (ref 65–99)
Potassium: 3.9 mmol/L (ref 3.5–5.1)
Sodium: 140 mmol/L (ref 135–145)

## 2015-10-08 NOTE — Progress Notes (Signed)
PT Cancellation Note  [name] MRN: [id number] DOB: [birth year]   Cancelled Treatment:    Reason Eval/Treat Not Completed: Patient not medically ready.  OT spoke with RN who indicates pt is currently sedated on vent with plan for trach tomorrow.  Will hold PT eval at this time and f/u as appropriate.     Kili Gracy, Thornton Papas 10/08/2015, 8:16 AM

## 2015-10-08 NOTE — Clinical Social Work Note (Signed)
CSW continuing to follow patient for any potential DC needs. Daughter Chamere informed CSW yesterday that the patient's bank has given her a cashiers check to pay for the patient's rent. She hopes to get POA paperwork completed when the patient is able to do this.  Liz Beach MSW, [place], [place], [id number]

## 2015-10-08 NOTE — Progress Notes (Signed)
Patient ID: [name] [name], male   DOB: Jun 19, 1951, 65 y.o.   MRN: [id number] Follow up - Trauma Critical Care  Patient Details:    [name] [name] is an 65 y.o. male.  Lines/tubes : Airway 7.5 mm (Active)  Secured at (cm) 23 cm 10/08/2015  7:17 AM  Measured From Lips 10/08/2015  7:17 AM  Secured Location Right 10/08/2015  7:17 AM  Secured By [employer] 10/08/2015  7:17 AM  Tube Holder Repositioned Yes 10/08/2015  7:17 AM  Cuff Pressure (cm H2O) 29 cm H2O 10/07/2015  7:52 PM  Site Condition Cool;Dry 10/08/2015  7:17 AM     PICC Double Lumen [id number] PICC Right Basilic 48 cm 3 cm (Active)  Indication for Insertion or Continuance of Line Prolonged intravenous therapies 10/07/2015  8:00 PM  Exposed Catheter (cm) 3 cm 10/02/2015 12:00 PM  Site Assessment Clean;Dry;Intact 10/07/2015  8:00 PM  Lumen #1 Status Infusing 10/07/2015  8:00 PM  Lumen #2 Status Infusing 10/07/2015  8:00 PM  Dressing Type Transparent;Gauze 10/07/2015  8:00 PM  Dressing Status Clean;Dry;Intact 10/07/2015  8:00 PM  Line Care Connections checked and tightened 10/07/2015  8:00 PM  Dressing Change Due 10/09/15 10/07/2015  8:00 PM     NG/OG Tube Orogastric Center mouth (Active)  Placement Verification Auscultation;Xray 10/07/2015  8:00 PM  Site Assessment Clean;Dry;Intact 10/08/2015  4:00 AM  Status Infusing tube feed 10/08/2015  4:00 AM  Intake (mL) 30 mL 10/08/2015  6:00 AM     External Urinary Catheter (Active)  Collection Container Standard drainage bag 10/07/2015  8:00 PM  Securement Method Securing device (Describe) 10/07/2015  8:00 PM  Output (mL) 350 mL 10/08/2015  6:00 AM    Microbiology/Sepsis markers: Results for orders placed or performed during the hospital encounter of 09/18/15  MRSA PCR Screening     Status: None   Collection Time: 09/19/15  4:46 AM  Result Value Ref Range Status   MRSA by PCR NEGATIVE NEGATIVE Final    Comment:        The GeneXpert MRSA Assay (FDA approved for NASAL specimens only),  is one component of a comprehensive MRSA colonization surveillance program. It is not intended to diagnose MRSA infection nor to guide or monitor treatment for MRSA infections.   C difficile quick scan w PCR reflex     Status: None   Collection Time: 09/22/15  9:15 AM  Result Value Ref Range Status   C Diff antigen NEGATIVE NEGATIVE Final   C Diff toxin NEGATIVE NEGATIVE Final   C Diff interpretation Negative for toxigenic C. difficile  Final  Culture, respiratory (NON-Expectorated)     Status: None   Collection Time: 09/23/15  8:56 AM  Result Value Ref Range Status   Specimen Description TRACHEAL ASPIRATE  Final   Special Requests Normal  Final   Gram Stain   Final    ABUNDANT WBC PRESENT, PREDOMINANTLY PMN RARE SQUAMOUS EPITHELIAL CELLS PRESENT NO ORGANISMS SEEN Performed at [facility]    Culture   Final    NORMAL OROPHARYNGEAL FLORA Performed at [facility]    Report Status 09/25/2015 FINAL  Final    Anti-infectives:  Anti-infectives    Start     Dose/Rate Route Frequency Ordered Stop   09/20/15 1700  piperacillin-tazobactam (ZOSYN) IVPB 3.375 g     3.375 g 12.5 mL/hr over 240 Minutes Intravenous Every 8 hours 09/20/15 1540 09/26/15 2117      Best Practice/Protocols:  VTE Prophylaxis: Lovenox (prophylaxtic dose) Continous  Sedation  Subjective:    Overnight Issues:  Stable, required sedation Objective:  Vital signs for last 24 hours: Temp:  [98.2 F (36.8 C)-99.4 F (37.4 C)] 98.2 F (36.8 C) (01/24 0400) Pulse Rate:  [115-124] 116 (01/24 0717) Resp:  [20-27] 27 (01/24 0717) BP: (111-169)/(65-97) 149/90 mmHg (01/24 0630) SpO2:  [95 %-100 %] 98 % (01/24 0717) FiO2 (%):  [40 %] 40 % (01/24 0717) Weight:  [74.6 kg (164 lb 7.4 oz)] 74.6 kg (164 lb 7.4 oz) (01/24 0300)  Hemodynamic parameters for last 24 hours: CVP:  [2 mmHg-12 mmHg] 8 mmHg  Intake/Output from previous day: 01/23 0701 - 01/24 0700 In: 2213.6 [I.V.:683.6;  NG/GT:1530] Out: 1600 [Urine:1600]  Intake/Output this shift:    Vent settings for last 24 hours: Vent Mode:  [-] PRVC FiO2 (%):  [40 %] 40 % Set Rate:  [18 bmp] 18 bmp Vt Set:  [560 mL] 560 mL PEEP:  [5 cmH20] 5 cmH20 Plateau Pressure:  [16 cmH20-20 cmH20] 18 cmH20  Physical Exam:  General: on vent Neuro: sedated but arouses and F/C HEENT/Neck: ETT Resp: clear to auscultation bilaterally CVS: RRR GI: soft, NT, ND Extremities: edema 1+  Results for orders placed or performed during the hospital encounter of 09/18/15 (from the past 24 hour(s))  Glucose, capillary     Status: Abnormal   Collection Time: 10/07/15 12:23 PM  Result Value Ref Range   Glucose-Capillary 116 (H) 65 - 99 mg/dL   Comment 1 Notify RN    Comment 2 Document in Chart   Glucose, capillary     Status: Abnormal   Collection Time: 10/07/15  3:35 PM  Result Value Ref Range   Glucose-Capillary 113 (H) 65 - 99 mg/dL   Comment 1 Notify RN    Comment 2 Document in Chart   Glucose, capillary     Status: Abnormal   Collection Time: 10/07/15  8:32 PM  Result Value Ref Range   Glucose-Capillary 119 (H) 65 - 99 mg/dL   Comment 1 Notify RN    Comment 2 Document in Chart   Glucose, capillary     Status: Abnormal   Collection Time: 10/08/15 12:05 AM  Result Value Ref Range   Glucose-Capillary 107 (H) 65 - 99 mg/dL   Comment 1 Notify RN    Comment 2 Document in Chart   Glucose, capillary     Status: Abnormal   Collection Time: 10/08/15  4:09 AM  Result Value Ref Range   Glucose-Capillary 108 (H) 65 - 99 mg/dL   Comment 1 Notify RN    Comment 2 Document in Chart   CBC     Status: Abnormal   Collection Time: 10/08/15  5:30 AM  Result Value Ref Range   WBC 9.8 4.0 - 10.5 K/uL   RBC 3.19 (L) 4.22 - 5.81 MIL/uL   Hemoglobin 9.5 (L) 13.0 - 17.0 g/dL   HCT 30.4 (L) 39.0 - 52.0 %   MCV 95.3 78.0 - 100.0 fL   MCH 29.8 26.0 - 34.0 pg   MCHC 31.3 30.0 - 36.0 g/dL   RDW 14.2 11.5 - 15.5 %   Platelets 547 (H) 150 -  400 K/uL  Basic metabolic panel     Status: Abnormal   Collection Time: 10/08/15  5:30 AM  Result Value Ref Range   Sodium 140 135 - 145 mmol/L   Potassium 3.9 3.5 - 5.1 mmol/L   Chloride 102 101 - 111 mmol/L   CO2 31 22 - 32  mmol/L   Glucose, Bld 149 (H) 65 - 99 mg/dL   BUN 16 6 - 20 mg/dL   Creatinine, Ser 0.58 (L) 0.61 - 1.24 mg/dL   Calcium 9.5 8.9 - 10.3 mg/dL   GFR calc non Af Amer >60 >60 mL/min   GFR calc Af Amer >60 >60 mL/min   Anion gap 7 5 - 15    Assessment & Plan: Present on Admission:  . Fracture of multiple ribs . Multiple rib fractures   LOS: 20 days   Additional comments:I reviewed the patient's new clinical lab test results. and x-rays [facility] Multiple left rib fxs w/pulmonary contusions - L CT out and no PTX Left renal contusion ARF w/ARDS - extubated yesterday but required re-intubation. For trach tomorrow. I will D/W his daughter. ABL anemia - stable Penile pressure wound - appreciate WOC eval FEN - TF VTE - SCD's, Lovenox Dispo - ICU, plan trach/PEG tomorrow Critical Care Total Time*: 31 Minutes  Georganna Skeans, MD, MPH, FACS Trauma: [phone] General Surgery: [phone]  10/08/2015  *Care during the described time interval was provided by me. I have reviewed this patient's available data, including medical history, events of note, physical examination and test results as part of my evaluation.

## 2015-10-08 NOTE — Progress Notes (Signed)
OT Cancellation Note  [name] MRN: [id number] DOB: 07-26-1951   Cancelled Treatment:    Reason Eval/Treat Not Completed: Patient not medically ready. Chart reviewed and spoke with nurs, pt extubated and re-intubated yesterday, currently sedated and will be trached tomorrow. Will await until after trach to eval as appropriate.  Almon Register [id number] 10/08/2015, 8:16 AM

## 2015-10-09 ENCOUNTER — Inpatient Hospital Stay (HOSPITAL_COMMUNITY): Payer: Non-veteran care

## 2015-10-09 ENCOUNTER — Encounter (HOSPITAL_COMMUNITY): Admission: EM | Disposition: A | Discharge status: Home / Self Care | Payer: Self-pay | Source: Home / Self Care

## 2015-10-09 HISTORY — PX: ESOPHAGOGASTRODUODENOSCOPY: SHX5428

## 2015-10-09 HISTORY — PX: PERCUTANEOUS TRACHEOSTOMY: SHX5288

## 2015-10-09 HISTORY — PX: PEG PLACEMENT: SHX5437

## 2015-10-09 LAB — CBC
HCT: 31.1 % — ABNORMAL LOW (ref 39.0–52.0)
Hemoglobin: 9.6 g/dL — ABNORMAL LOW (ref 13.0–17.0)
MCH: 29.5 pg (ref 26.0–34.0)
MCHC: 30.9 g/dL (ref 30.0–36.0)
MCV: 95.7 fL (ref 78.0–100.0)
PLATELETS: 518 10*3/uL — AB (ref 150–400)
RBC: 3.25 MIL/uL — ABNORMAL LOW (ref 4.22–5.81)
RDW: 14.2 % (ref 11.5–15.5)
WBC: 9.3 10*3/uL (ref 4.0–10.5)

## 2015-10-09 LAB — BASIC METABOLIC PANEL
Anion gap: 6 (ref 5–15)
BUN: 16 mg/dL (ref 6–20)
CALCIUM: 9.1 mg/dL (ref 8.9–10.3)
CO2: 29 mmol/L (ref 22–32)
Chloride: 95 mmol/L — ABNORMAL LOW (ref 101–111)
Creatinine, Ser: 0.49 mg/dL — ABNORMAL LOW (ref 0.61–1.24)
GFR calc Af Amer: >60 mL/min (ref >60)
GLUCOSE: 109 mg/dL — AB (ref 65–99)
POTASSIUM: 4.2 mmol/L (ref 3.5–5.1)
Sodium: 130 mmol/L — ABNORMAL LOW (ref 135–145)

## 2015-10-09 LAB — GLUCOSE, CAPILLARY
GLUCOSE-CAPILLARY: 102 mg/dL — AB (ref 65–99)
Glucose-Capillary: 103 mg/dL — ABNORMAL HIGH (ref 65–99)
Glucose-Capillary: 123 mg/dL — ABNORMAL HIGH (ref 65–99)
Glucose-Capillary: 124 mg/dL — ABNORMAL HIGH (ref 65–99)
Glucose-Capillary: 125 mg/dL — ABNORMAL HIGH (ref 65–99)
Glucose-Capillary: 137 mg/dL — ABNORMAL HIGH (ref 65–99)

## 2015-10-09 LAB — PROTIME-INR
INR: 1.32 (ref 0.00–1.49)
Prothrombin Time: 16.5 seconds — ABNORMAL HIGH (ref 11.6–15.2)

## 2015-10-09 SURGERY — CREATION, TRACHEOSTOMY, PERCUTANEOUS
Anesthesia: Monitor Anesthesia Care | Site: Neck

## 2015-10-09 SURGERY — INSERTION, PEG TUBE

## 2015-10-09 MED ORDER — LIDOCAINE-EPINEPHRINE 1.5 %-1:200,000 OPTIME - NO CHARGE
INTRAMUSCULAR | Status: DC | PRN
  Administered 2015-10-09: 3 mL via SUBCUTANEOUS

## 2015-10-09 MED ORDER — VECURONIUM BROMIDE 10 MG IV SOLR: 10.0000 mg | Freq: Once | INTRAVENOUS | Status: DC

## 2015-10-09 MED ORDER — VECURONIUM BROMIDE 10 MG IV SOLR
INTRAVENOUS | Status: AC
  Administered 2015-10-09: 10 mg
  Filled 2015-10-09: qty 10

## 2015-10-09 SURGICAL SUPPLY — 32 items
DRAPE PROXIMA HALF (DRAPES) ×6 IMPLANT
DRAPE UTILITY XL STRL (DRAPES) ×6 IMPLANT
ELECT CAUTERY BLADE 6.4 (BLADE) ×3 IMPLANT
ELECT REM PT RETURN 9FT ADLT (ELECTROSURGICAL) ×3
ELECTRODE REM PT RTRN 9FT ADLT (ELECTROSURGICAL) ×1 IMPLANT
GAUZE SPONGE 4X4 16PLY XRAY LF (GAUZE/BANDAGES/DRESSINGS) ×3 IMPLANT
GLOVE BIO SURGEON STRL SZ 6 (GLOVE) ×2 IMPLANT
GLOVE BIO SURGEON STRL SZ 6.5 (GLOVE) ×2 IMPLANT
GLOVE BIO SURGEON STRL SZ7.5 (GLOVE) ×3 IMPLANT
GLOVE BIO SURGEON STRL SZ8 (GLOVE) ×3 IMPLANT
GLOVE BIO SURGEONS STRL SZ 6.5 (GLOVE) ×1
GLOVE BIOGEL PI IND STRL 7.5 (GLOVE) ×1 IMPLANT
GLOVE BIOGEL PI IND STRL 8 (GLOVE) ×1 IMPLANT
GLOVE BIOGEL PI INDICATOR 7.5 (GLOVE) ×4
GLOVE BIOGEL PI INDICATOR 8 (GLOVE) ×4
GLOVE ECLIPSE 7.5 STRL STRAW (GLOVE) ×3 IMPLANT
GOWN STRL REUS W/ TWL LRG LVL3 (GOWN DISPOSABLE) ×2 IMPLANT
GOWN STRL REUS W/ TWL XL LVL3 (GOWN DISPOSABLE) ×1 IMPLANT
GOWN STRL REUS W/TWL LRG LVL3 (GOWN DISPOSABLE) ×6
GOWN STRL REUS W/TWL XL LVL3 (GOWN DISPOSABLE) ×3
INTRODUCER TRACH BLUE RHINO 6F (TUBING) IMPLANT
INTRODUCER TRACH BLUE RHINO 8F (TUBING) IMPLANT
NS IRRIG 1000ML POUR BTL (IV SOLUTION) ×3 IMPLANT
PENCIL BUTTON HOLSTER BLD 10FT (ELECTRODE) ×3 IMPLANT
SPONGE DRAIN TRACH 4X4 STRL 2S (GAUZE/BANDAGES/DRESSINGS) ×2 IMPLANT
SPONGE INTESTINAL PEANUT (DISPOSABLE) ×3 IMPLANT
SUT SILK 2 0 SH CR/8 (SUTURE) ×3 IMPLANT
SUT VICRYL AB 3 0 TIES (SUTURE) ×3 IMPLANT
TOWEL OR 17X26 10 PK STRL BLUE (TOWEL DISPOSABLE) ×3 IMPLANT
TUBE CONNECTING 12'X1/4 (SUCTIONS) ×1
TUBE CONNECTING 12X1/4 (SUCTIONS) ×2 IMPLANT
YANKAUER SUCT BULB TIP NO VENT (SUCTIONS) ×3 IMPLANT

## 2015-10-09 SURGICAL SUPPLY — 1 items: PEG ×2 IMPLANT

## 2015-10-09 NOTE — Progress Notes (Signed)
PT Cancellation Note  [name] MRN: [id number] DOB: [birth year]   Cancelled Treatment:    Reason Eval/Treat Not Completed: Patient not medically ready.  Pt sedated for trach and peg this am.  Will check back tomorrow.     Chera Slivka, Thornton Papas 10/09/2015, 10:52 AM

## 2015-10-09 NOTE — Op Note (Signed)
OPERATIVE REPORT  DATE OF OPERATION: 10/09/2015  PATIENT:  [name] [name]  65 y.o. male  PRE-OPERATIVE DIAGNOSIS:  prolonged intubation. malnutrition  POST-OPERATIVE DIAGNOSIS:  Same  PROCEDURE:  Procedure(s): PERCUTANEOUS TRACHEOSTOMY  SURGEON:  Surgeon(s): Judeth Horn, MD  ASSISTANT: [place], PA-S  ANESTHESIA:   local and IV sedation  EBL: <30 ml  BLOOD ADMINISTERED: none  DRAINS: Gastrostomy Tube and Tracheostomy, #8 [place]   SPECIMEN:  No Specimen  COUNTS CORRECT:  YES  PROCEDURE DETAILS: This procedure was performed at the bedside in the 3 mid [place] intensive care unit. Procedures were performed, a percutaneous endoscopic gastrostomy tube and a percutaneous tracheostomy.  The initial procedure was a tracheostomy. A proper timeout was performed identifying the patient and procedure to be performed. He was prepped and draped in usual sterile manner exposing his neck. The patient had been given intravenous sedation consisting of Versed and fentanyl. He also received a bolus of Norcuron for the procedure.  The side of the transverse incision was identified approximately a centimeter to 2 above the sternal notch. A transverse incision was made using a #15 blade then we dissected down to the pretracheal fascia. A hook was placed at the cricoid cartilage lifting of the trachea and identifying the second tracheal ring. Was at that site that an angiocatheter was inserted into the lumen of the trachea after the endotracheal tube been retracted slightly. We were able to aspirate air from the tracheal lumen and then subsequent a past a green wire through the Angiocath into the lumen of the trachea. A short stubby blue dilator then the serial blue rhino dilator was passed over the wire into the trachea enlarging the tracheotomy. Subsequently passed a #8 tracheostomy tube with an introducer of a 28 French dilator into the tracheotomy. It was secured in place after attaching it to the circuit  and identifying adequate volume return.  Adequate positioning of the tracheostomy was identified through the endotracheal tube noting the balloon in the tracheal lumen and also directly through the tracheostomy.  Was a percutaneous endoscopic gastrostomy tube that was done primarily using a Pentax [id number] endoscope.  A second timeout was performed identifying the patient and procedure to be performed. The abdominal wall was prepped in the usual sterile manner. The endoscope was passed following the orogastric tube into the esophagus. We subsequently removed the orogastric tube. We passed the stomach into the pylorus and the duodenum and then retracted the endoscope back into the body of the stomach where we could see the impulse from the assistant on the anterior abdominal wall. Was at that site that incision was made and a angiocatheter passed into the stomach under direct vision. A looped blue wire was passed through the angiocatheter then subsequently snared by the scope and port out the patient's mouth. We then attached to the pull-through gastrostomy tube which was then passed back oropharyngeally into its resting side on the anterior abdominal wall in the upper portion of stomach. Was converted be in position with the endoscope. Pictures were taken of the gastrostomy tube in the proper place.  Once in place was secured in the usual manner. All counts were correct including needles, sponges, and instruments.  PATIENT DISPOSITION:  Remained in the ICU in stable condition   Nickole Adamek 1/25/201710:37 AM

## 2015-10-09 NOTE — Progress Notes (Signed)
Pt s/p PEG and tracheostomy today.  Left message with [facility] with callback information.  Need to provide clinical updates/discuss possible [facility] transfer with [facility] transfer [facility].   Will follow up.    [name], RN, BSN  Trauma/Neuro ICU Case Manager [phone]

## 2015-10-09 NOTE — Progress Notes (Signed)
Spoke with Margo Common, [employer], to discuss possibility of negotiating contract for pt admission to [place] for vent weaning.  Though pt admission has been reported, Mr. Ricci Barker states [facility] unaware of pt admission.  He states he will make [facility] transfer coordinators aware of pt admission, and they will notify me if they have questions or need further information.  He also states that in order for LTAC to be considered, pt's Anza would have to approve and sign off on this.  Await contact from [place] instruction regarding possible LTAC admission.   Will discuss with family once more information is known.    [name], RN, BSN  Trauma/Neuro ICU Case Manager [phone]

## 2015-10-09 NOTE — Progress Notes (Signed)
Trauma Service Note  Subjective: Patient sedated on the ventilator.  No distress.  Planned for tracheostomy and PEG today.  Objective: Vital signs in last 24 hours: Temp:  [97.5 F (36.4 C)-98.8 F (37.1 C)] 97.5 F (36.4 C) (01/25 0400) Pulse Rate:  [113-127] 113 (01/25 0800) Resp:  [16-28] 18 (01/25 0800) BP: (121-201)/(69-105) 127/86 mmHg (01/25 0800) SpO2:  [94 %-100 %] 98 % (01/25 0800) FiO2 (%):  [40 %] 40 % (01/25 0800) Weight:  [73.2 kg (161 lb 6 oz)] 73.2 kg (161 lb 6 oz) (01/25 0600) Last BM Date: 10/09/15  Intake/Output from previous day: 01/24 0701 - 01/25 0700 In: 2046.1 [I.V.:906.1; NG/GT:1140] Out: 3075 [Urine:3075] Intake/Output this shift: Total I/O In: 24 [I.V.:24] Out: -   General: No distress.  Not weaning.  Will try to wean after tracheostomy is in place  Lungs: Clear and only on FIO2 40%  Sats are 98-100%.  Abd: Bening.  Tube feedings are off.  Extremities: No changes  Neuro: Sedated  Lab Results: CBC   Recent Labs  10/08/15 0530 10/09/15 0330  WBC 9.8 9.3  HGB 9.5* 9.6*  HCT 30.4* 31.1*  PLT 547* 518*   BMET  Recent Labs  10/08/15 0530 10/09/15 0330  NA 140 130*  K 3.9 4.2  CL 102 95*  CO2 31 29  GLUCOSE 149* 109*  BUN 16 16  CREATININE 0.58* 0.49*  CALCIUM 9.5 9.1   PT/INR  Recent Labs  10/09/15 0330  LABPROT 16.5*  INR 1.32   ABG No results for input(s): PHART, HCO3 in the last 72 hours.  Invalid input(s): PCO2, PO2  Studies/Results: Dg Chest Port 1 View  10/09/2015  CLINICAL DATA:  Respiratory failure, history of COPD, current smoker, intubated. EXAM: PORTABLE CHEST 1 VIEW COMPARISON:  Portable chest x-ray of October 07, 2015 FINDINGS: The lungs are adequately inflated. Persistent patchy density is present especially on the left. There is a small left pleural effusion. Coarse infrahilar lung markings on the right are slightly less conspicuous today. The cardiac silhouette remains enlarged. The pulmonary  vascularity is not significantly engorged. The endotracheal tube tip lies 3.2 cm above the carina. The esophagogastric tube tip projects below the inferior margin of the image. The right-sided PICC line tip projects just above the cavoatrial junction. IMPRESSION: Stable to slightly decreased conspicuity of left lower lobe atelectasis or pneumonia. Stable small left pleural effusion. Improvement in the right infrahilar subsegmental atelectasis. The support tubes are in stable position. Electronically Signed   By: David  Martinique M.D.   On: 10/09/2015 07:15   Dg Chest Port 1 View  10/07/2015  CLINICAL DATA:  Status post re-intubation EXAM: PORTABLE CHEST 1 VIEW COMPARISON:  10/06/2015 FINDINGS: Endotracheal tube is 3.5 cm above the carina. Right central line and NG tube are unchanged. Mild cardiomegaly. Patchy bibasilar atelectasis or infiltrates, slightly increased. No effusions. No acute bony abnormality. IMPRESSION: Endotracheal tube 3.5 cm above the carina. Patchy bibasilar atelectasis or infiltrates, slightly increased. Electronically Signed   By: Rolm Baptise M.D.   On: 10/07/2015 12:16   Dg Abd Portable 1v  10/07/2015  CLINICAL DATA:  Confirm OG tube position EXAM: PORTABLE ABDOMEN - 1 VIEW COMPARISON:  CT 09/21/2015 FINDINGS: NG tube is in place with the tip in the mid to distal stomach. The side port is in the mid stomach. Mildly prominent mid abdominal small bowel loops. No free air. IMPRESSION: NG tube tip in the mid to distal stomach. Electronically Signed   By: Lennette Bihari  [place] M.D.   On: 10/07/2015 12:22    Anti-infectives: Anti-infectives    Start     Dose/Rate Route Frequency Ordered Stop   09/20/15 1700  piperacillin-tazobactam (ZOSYN) IVPB 3.375 g     3.375 g 12.5 mL/hr over 240 Minutes Intravenous Every 8 hours 09/20/15 1540 09/26/15 2117      Assessment/Plan: s/p Procedure(s): PERCUTANEOUS ENDOSCOPIC GASTROSTOMY (PEG) PLACEMENT Trach/PEG today.  Norcuron for procedure in addition to  increased sedation.  LOS: 21 days   Kathryne Eriksson. Dahlia Bailiff, MD, FACS [phone] Trauma Surgeon 10/09/2015

## 2015-10-09 NOTE — Progress Notes (Signed)
OT Cancellation Note  [name] MRN: [id number] DOB: 26-May-1951   Cancelled Treatment:    Reason Eval/Treat Not Completed: Patient not medically ready. Pt sedated for trach and peg this am. Will check back tomorrow.  Almon Register 10/09/2015, 11:18 AM

## 2015-10-09 NOTE — Progress Notes (Signed)
During the trach and peg procedure per Dr Hulen Skains meds were given via bolus from bag.  Started the procedure at 0935. At [id number] gave 100 mcg fentanyl and 5 mg versed from bag. 0952 100 mcg fentanyl, 0955 5 mg versed and 100 mcg fentanyl. Peg tube procedure started at 1018. During that procedure gave 100 mcg fentanyl and 5 mg versed.

## 2015-10-10 ENCOUNTER — Encounter (HOSPITAL_COMMUNITY): Payer: Self-pay | Admitting: General Surgery

## 2015-10-10 LAB — GLUCOSE, CAPILLARY
GLUCOSE-CAPILLARY: 134 mg/dL — AB (ref 65–99)
GLUCOSE-CAPILLARY: 101 mg/dL — AB (ref 65–99)
Glucose-Capillary: 154 mg/dL — ABNORMAL HIGH (ref 65–99)
Glucose-Capillary: 162 mg/dL — ABNORMAL HIGH (ref 65–99)
Glucose-Capillary: 169 mg/dL — ABNORMAL HIGH (ref 65–99)
Glucose-Capillary: 171 mg/dL — ABNORMAL HIGH (ref 65–99)

## 2015-10-10 MED ORDER — METOPROLOL TARTRATE 25 MG/10 ML ORAL SUSPENSION: 25.0000 mg | Freq: Two times a day (BID) | ORAL | Status: DC

## 2015-10-10 MED ORDER — CLONAZEPAM 0.5 MG PO TBDP
1.0000 mg | ORAL_TABLET | Freq: Three times a day (TID) | ORAL | Status: DC
  Administered 2015-10-10 – 2015-10-15 (×15): 1 mg
  Filled 2015-10-10 (×15): qty 2

## 2015-10-10 MED ORDER — METOPROLOL TARTRATE 25 MG/10 ML ORAL SUSPENSION
25.0000 mg | Freq: Two times a day (BID) | ORAL | Status: DC
  Administered 2015-10-10 – 2015-10-12 (×6): 25 mg
  Filled 2015-10-10 (×6): qty 10
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
## 2015-10-10 NOTE — Progress Notes (Signed)
Called to patient's room by RN stating that patient's RR was in the 40's. Upon entering patient's room I noticed that the patients trach was slightly dislodged. [name] was pushed back into correct position (comfirmed by positive color change on ETCO2). Patient is now getting adequate tidal volumes & RR has decreased.

## 2015-10-10 NOTE — Progress Notes (Signed)
Patient ID: [name] [name], male   DOB: [birth year], 65 y.o.   MRN: [id number] Follow up - Trauma Critical Care  Patient Details:    [name] [name] is an 65 y.o. male.  Lines/tubes : PICC Double Lumen [id number] PICC Right Basilic 48 cm 3 cm (Active)  Indication for Insertion or Continuance of Line Prolonged intravenous therapies 10/10/2015  7:30 AM  Exposed Catheter (cm) 3 cm 10/02/2015 12:00 PM  Site Assessment Clean;Dry;Intact 10/10/2015  7:30 AM  Lumen #1 Status Infusing 10/10/2015  7:30 AM  Lumen #2 Status Flushed;Saline locked;Blood return noted 10/10/2015  7:30 AM  Dressing Type Transparent;Gauze;Occlusive 10/10/2015  7:30 AM  Dressing Status Clean;Dry;Intact 10/10/2015  7:30 AM  Line Care Connections checked and tightened 10/10/2015  7:30 AM  Dressing Intervention New dressing;Antimicrobial disc changed 10/09/2015  2:45 PM  Dressing Change Due 10/16/15 10/10/2015  7:30 AM     External Urinary Catheter (Active)  Collection Container Standard drainage bag 10/09/2015  8:00 PM  Securement Method Securing device (Describe) 10/09/2015  8:00 PM  Output (mL) 280 mL 10/10/2015  6:00 AM    Microbiology/Sepsis markers: Results for orders placed or performed during the hospital encounter of 09/18/15  MRSA PCR Screening     Status: None   Collection Time: 09/19/15  4:46 AM  Result Value Ref Range Status   MRSA by PCR NEGATIVE NEGATIVE Final    Comment:        The GeneXpert MRSA Assay (FDA approved for NASAL specimens only), is one component of a comprehensive MRSA colonization surveillance program. It is not intended to diagnose MRSA infection nor to guide or monitor treatment for MRSA infections.   C difficile quick scan w PCR reflex     Status: None   Collection Time: 09/22/15  9:15 AM  Result Value Ref Range Status   C Diff antigen NEGATIVE NEGATIVE Final   C Diff toxin NEGATIVE NEGATIVE Final   C Diff interpretation Negative for toxigenic C. difficile  Final  Culture, respiratory  (NON-Expectorated)     Status: None   Collection Time: 09/23/15  8:56 AM  Result Value Ref Range Status   Specimen Description TRACHEAL ASPIRATE  Final   Special Requests Normal  Final   Gram Stain   Final    ABUNDANT WBC PRESENT, PREDOMINANTLY PMN RARE SQUAMOUS EPITHELIAL CELLS PRESENT NO ORGANISMS SEEN Performed at [facility]    Culture   Final    NORMAL OROPHARYNGEAL FLORA Performed at [facility]    Report Status 09/25/2015 FINAL  Final    Anti-infectives:  Anti-infectives    Start     Dose/Rate Route Frequency Ordered Stop   09/20/15 1700  piperacillin-tazobactam (ZOSYN) IVPB 3.375 g     3.375 g 12.5 mL/hr over 240 Minutes Intravenous Every 8 hours 09/20/15 1540 09/26/15 2117      Best Practice/Protocols:  VTE Prophylaxis: Lovenox (prophylaxtic dose) Continous Sedation  Consults:      Studies:    Events:  Subjective:    Overnight Issues:   Objective:  Vital signs for last 24 hours: Temp:  [97.4 F (36.3 C)-98.6 F (37 C)] 98.6 F (37 C) (01/26 0400) Pulse Rate:  [113-130] 122 (01/26 0700) Resp:  [18-31] 24 (01/26 0700) BP: (111-164)/(69-101) 118/83 mmHg (01/26 0700) SpO2:  [88 %-100 %] 95 % (01/26 0700) FiO2 (%):  [40 %] 40 % (01/26 0308) Weight:  [67.2 kg (148 lb 2.4 oz)] 67.2 kg (148 lb 2.4 oz) (01/26 0159)  Hemodynamic parameters for last  24 hours: CVP:  [8 mmHg-9 mmHg] 9 mmHg  Intake/Output from previous day: 01/25 0701 - 01/26 0700 In: 1060 [I.V.:1060] Out: 3075 [Urine:3075]  Intake/Output this shift:    Vent settings for last 24 hours: Vent Mode:  [-] PRVC FiO2 (%):  [40 %] 40 % Set Rate:  [18 bmp] 18 bmp Vt Set:  [560 mL] 560 mL PEEP:  [5 cmH20] 5 cmH20 Plateau Pressure:  [19 cmH20-27 cmH20] 20 cmH20  Physical Exam:  General: no distress Neuro: sedated HEENT/Neck: trach in place Resp: wheezes bilaterally CVS: RRR 120 GI: soft, NT, PEG in place Extremities: edema 1+  Results for orders placed or  performed during the hospital encounter of 09/18/15 (from the past 24 hour(s))  Glucose, capillary     Status: Abnormal   Collection Time: 10/09/15  8:54 AM  Result Value Ref Range   Glucose-Capillary 102 (H) 65 - 99 mg/dL  Glucose, capillary     Status: Abnormal   Collection Time: 10/09/15 11:49 AM  Result Value Ref Range   Glucose-Capillary 125 (H) 65 - 99 mg/dL   Comment 1 Notify RN    Comment 2 Document in Chart   Glucose, capillary     Status: Abnormal   Collection Time: 10/09/15  3:37 PM  Result Value Ref Range   Glucose-Capillary 124 (H) 65 - 99 mg/dL   Comment 1 Notify RN    Comment 2 Document in Chart   Glucose, capillary     Status: Abnormal   Collection Time: 10/09/15  7:22 PM  Result Value Ref Range   Glucose-Capillary 123 (H) 65 - 99 mg/dL  Glucose, capillary     Status: Abnormal   Collection Time: 10/09/15 11:44 PM  Result Value Ref Range   Glucose-Capillary 137 (H) 65 - 99 mg/dL  Glucose, capillary     Status: Abnormal   Collection Time: 10/10/15  3:25 AM  Result Value Ref Range   Glucose-Capillary 134 (H) 65 - 99 mg/dL    Assessment & Plan: Present on Admission:  . Fracture of multiple ribs . Multiple rib fractures   LOS: 22 days   Additional comments:I reviewed the patient's new clinical lab test results. and CXR from yesterday [facility] Multiple left rib fxs w/pulmonary contusions - L CT out and no PTX Left renal contusion ARF w/ARDS - S/P trach, continue weaning, bronchodilators ABL anemia - stable Penile pressure wound - appreciate WOC eval FEN - TF, increase Klonopin CV - tachycardia - add lopressor VTE - SCD's, Lovenox Dispo - ICU Critical Care Total Time*: 32 Minutes  Georganna Skeans, MD, MPH, FACS Trauma: [phone] General Surgery: [phone]  10/10/2015  *Care during the described time interval was provided by me. I have reviewed this patient's available data, including medical history, events of note, physical examination and test  results as part of my evaluation.

## 2015-10-11 ENCOUNTER — Inpatient Hospital Stay (HOSPITAL_COMMUNITY): Payer: Non-veteran care

## 2015-10-11 LAB — BASIC METABOLIC PANEL
Anion gap: 6 (ref 5–15)
BUN: 21 mg/dL — AB (ref 6–20)
CO2: 30 mmol/L (ref 22–32)
Calcium: 9.7 mg/dL (ref 8.9–10.3)
Chloride: 100 mmol/L — ABNORMAL LOW (ref 101–111)
Creatinine, Ser: 0.6 mg/dL — ABNORMAL LOW (ref 0.61–1.24)
GFR calc Af Amer: >60 mL/min (ref >60)
GLUCOSE: 196 mg/dL — AB (ref 65–99)
POTASSIUM: 4 mmol/L (ref 3.5–5.1)
Sodium: 136 mmol/L (ref 135–145)

## 2015-10-11 LAB — GLUCOSE, CAPILLARY
GLUCOSE-CAPILLARY: 169 mg/dL — AB (ref 65–99)
GLUCOSE-CAPILLARY: 166 mg/dL — AB (ref 65–99)
GLUCOSE-CAPILLARY: 142 mg/dL — AB (ref 65–99)
Glucose-Capillary: 181 mg/dL — ABNORMAL HIGH (ref 65–99)
Glucose-Capillary: 174 mg/dL — ABNORMAL HIGH (ref 65–99)

## 2015-10-11 LAB — CBC
HEMATOCRIT: 32.9 % — AB (ref 39.0–52.0)
Hemoglobin: 10.5 g/dL — ABNORMAL LOW (ref 13.0–17.0)
MCH: 30.1 pg (ref 26.0–34.0)
MCHC: 31.9 g/dL (ref 30.0–36.0)
MCV: 94.3 fL (ref 78.0–100.0)
PLATELETS: 485 10*3/uL — AB (ref 150–400)
RBC: 3.49 MIL/uL — ABNORMAL LOW (ref 4.22–5.81)
RDW: 14.2 % (ref 11.5–15.5)
WBC: 16.3 10*3/uL — AB (ref 4.0–10.5)

## 2015-10-11 NOTE — Progress Notes (Signed)
Follow up - Trauma and Critical Care  Patient Details:    [name] [name] is an 65 y.o. male.  Lines/tubes : PICC Double Lumen [id number] PICC Right Basilic 48 cm 3 cm (Active)  Indication for Insertion or Continuance of Line Prolonged intravenous therapies 10/11/2015  7:30 AM  Exposed Catheter (cm) 3 cm 10/02/2015 12:00 PM  Site Assessment Clean;Dry;Intact 10/11/2015  7:30 AM  Lumen #1 Status Infusing 10/11/2015  7:30 AM  Lumen #2 Status Flushed;Saline locked;Blood return noted 10/11/2015  7:30 AM  Dressing Type Transparent;Gauze;Occlusive 10/11/2015  7:30 AM  Dressing Status Clean;Dry;Intact 10/11/2015  7:30 AM  Line Care Connections checked and tightened 10/11/2015  7:30 AM  Dressing Intervention New dressing;Antimicrobial disc changed 10/09/2015  2:45 PM  Dressing Change Due 10/16/15 10/11/2015  7:30 AM     Gastrostomy/Enterostomy Percutaneous endoscopic gastrostomy (PEG) (Active)  Surrounding Skin Intact 10/11/2015  7:30 AM  Tube Status Patent 10/11/2015  7:30 AM  Dressing Status Clean;Dry 10/11/2015  7:30 AM  Dressing Type Split gauze 10/11/2015  7:30 AM     External Urinary Catheter (Active)  Collection Container Standard drainage bag 10/11/2015  7:30 AM  Securement Method Securing device (Describe) 10/11/2015  7:30 AM  Output (mL) 235 mL 10/11/2015  5:00 AM    Microbiology/Sepsis markers: Results for orders placed or performed during the hospital encounter of 09/18/15  MRSA PCR Screening     Status: None   Collection Time: 09/19/15  4:46 AM  Result Value Ref Range Status   MRSA by PCR NEGATIVE NEGATIVE Final    Comment:        The GeneXpert MRSA Assay (FDA approved for NASAL specimens only), is one component of a comprehensive MRSA colonization surveillance program. It is not intended to diagnose MRSA infection nor to guide or monitor treatment for MRSA infections.   C difficile quick scan w PCR reflex     Status: None   Collection Time: 09/22/15  9:15 AM  Result Value Ref  Range Status   C Diff antigen NEGATIVE NEGATIVE Final   C Diff toxin NEGATIVE NEGATIVE Final   C Diff interpretation Negative for toxigenic C. difficile  Final  Culture, respiratory (NON-Expectorated)     Status: None   Collection Time: 09/23/15  8:56 AM  Result Value Ref Range Status   Specimen Description TRACHEAL ASPIRATE  Final   Special Requests Normal  Final   Gram Stain   Final    ABUNDANT WBC PRESENT, PREDOMINANTLY PMN RARE SQUAMOUS EPITHELIAL CELLS PRESENT NO ORGANISMS SEEN Performed at [facility]    Culture   Final    NORMAL OROPHARYNGEAL FLORA Performed at [facility]    Report Status 09/25/2015 FINAL  Final    Anti-infectives:  Anti-infectives    Start     Dose/Rate Route Frequency Ordered Stop   09/20/15 1700  piperacillin-tazobactam (ZOSYN) IVPB 3.375 g     3.375 g 12.5 mL/hr over 240 Minutes Intravenous Every 8 hours 09/20/15 1540 09/26/15 2117      Best Practice/Protocols:  VTE Prophylaxis: Lovenox (prophylaxtic dose) GI Prophylaxis: Proton Pump Inhibitor Continous Sedation  Consults:      Events:  Subjective:    Overnight Issues: Weaned poorly on the ventilator yesterday.  Weaning today.  Objective:  Vital signs for last 24 hours: Temp:  [97.6 F (36.4 C)-100 F (37.8 C)] 97.6 F (36.4 C) (01/27 0800) Pulse Rate:  [114-133] 127 (01/27 0900) Resp:  [22-32] 24 (01/27 0900) BP: (121-156)/(81-110) 122/81 mmHg (01/27 0900) SpO2:  [  66 %-99 %] 91 % (01/27 0900) FiO2 (%):  [40 %] 40 % (01/27 0900) Weight:  [67.4 kg (148 lb 9.4 oz)] 67.4 kg (148 lb 9.4 oz) (01/27 0500)  Hemodynamic parameters for last 24 hours:    Intake/Output from previous day: 01/26 0701 - 01/27 0700 In: 2121.4 [I.V.:801.4; NG/GT:1320] Out: 2360 [Urine:2360]  Intake/Output this shift: Total I/O In: 201 [I.V.:81; NG/GT:120] Out: -   Vent settings for last 24 hours: Vent Mode:  [-] PSV;CPAP FiO2 (%):  [40 %] 40 % Set Rate:  [18 bmp] 18 bmp Vt Set:   [560 mL] 560 mL PEEP:  [5 cmH20] 5 cmH20 Pressure Support:  [10 cmH20] 10 cmH20 Plateau Pressure:  [17 cmH20-30 cmH20] 18 cmH20  Physical Exam:  General: alert, no respiratory distress and weaning for the first time Neuro: alert, oriented and nonfocal exam Resp: clear to auscultation bilaterally CVS: Tachycardic, typically into the 140's GI: soft, nontender, BS WNL, no r/g and tolerating tube feedings well Extremities: no edema, no erythema, pulses WNL  Results for orders placed or performed during the hospital encounter of 09/18/15 (from the past 24 hour(s))  Glucose, capillary     Status: Abnormal   Collection Time: 10/10/15 11:32 AM  Result Value Ref Range   Glucose-Capillary 154 (H) 65 - 99 mg/dL   Comment 1 Notify RN    Comment 2 Document in Chart   Glucose, capillary     Status: Abnormal   Collection Time: 10/10/15  3:48 PM  Result Value Ref Range   Glucose-Capillary 171 (H) 65 - 99 mg/dL   Comment 1 Notify RN    Comment 2 Document in Chart   Glucose, capillary     Status: Abnormal   Collection Time: 10/10/15  7:33 PM  Result Value Ref Range   Glucose-Capillary 162 (H) 65 - 99 mg/dL  Glucose, capillary     Status: Abnormal   Collection Time: 10/10/15 11:33 PM  Result Value Ref Range   Glucose-Capillary 169 (H) 65 - 99 mg/dL  Glucose, capillary     Status: Abnormal   Collection Time: 10/11/15  3:18 AM  Result Value Ref Range   Glucose-Capillary 181 (H) 65 - 99 mg/dL  CBC     Status: Abnormal   Collection Time: 10/11/15  5:07 AM  Result Value Ref Range   WBC 16.3 (H) 4.0 - 10.5 K/uL   RBC 3.49 (L) 4.22 - 5.81 MIL/uL   Hemoglobin 10.5 (L) 13.0 - 17.0 g/dL   HCT 32.9 (L) 39.0 - 52.0 %   MCV 94.3 78.0 - 100.0 fL   MCH 30.1 26.0 - 34.0 pg   MCHC 31.9 30.0 - 36.0 g/dL   RDW 14.2 11.5 - 15.5 %   Platelets 485 (H) 150 - 400 K/uL  Basic metabolic panel     Status: Abnormal   Collection Time: 10/11/15  5:07 AM  Result Value Ref Range   Sodium 136 135 - 145 mmol/L    Potassium 4.0 3.5 - 5.1 mmol/L   Chloride 100 (L) 101 - 111 mmol/L   CO2 30 22 - 32 mmol/L   Glucose, Bld 196 (H) 65 - 99 mg/dL   BUN 21 (H) 6 - 20 mg/dL   Creatinine, Ser 0.60 (L) 0.61 - 1.24 mg/dL   Calcium 9.7 8.9 - 10.3 mg/dL   GFR calc non Af Amer >60 >60 mL/min   GFR calc Af Amer >60 >60 mL/min   Anion gap 6 5 - 15  Glucose, capillary  Status: Abnormal   Collection Time: 10/11/15  8:17 AM  Result Value Ref Range   Glucose-Capillary 174 (H) 65 - 99 mg/dL   Comment 1 Notify RN    Comment 2 Document in Chart      Assessment/Plan:   NEURO  Altered Mental Status:  agitation and intermittenly agitated.   Plan: CPM.  Patient also has weakness in his upper and lower extremities.  Like central cord patient.  CT Ct spine was negative and he was written for C-spine flex ext but never done.  May need MRI  PULM  CXR pretty clear.  No focal infiltrates   Plan: Weaning on the ventilator  CARDIO  Sinus Tachycardia   Plan: No specific treatment  RENAL  Urine output good.  No specific treatentn necessary.   Plan: CM  GI  No specific issues   Plan: CPM.  Continue tube feedings  ID  No fevers, but WBC is increased.  Sugars are increased.   Plan: Check for infections.  HEME  Anemia anemia of chronic disease)   Plan: Mild and does not require treatment  ENDO No specific issues.   Plan: CPM  Global Issues  Patient weaning on the ventilator and may be able to go on trach collar at some time.  WBC elevation and hyperglycemia make me concerned about infection somewhere that has not been uncovered.  Will check all culture.  Neurologically I am concerned about upper and lower extremity weakness.  Will get C-spine MRI    LOS: 23 days   Additional comments:I reviewed the patient's new clinical lab test results. cbc/bmet and I reviewed the patients new imaging test results. cxr  Critical Care Total Time*: 45 Minutes  Joelle Flessner 10/11/2015  *Care during the described time interval  was provided by me and/or other providers on the critical care team.  I have reviewed this patient's available data, including medical history, events of note, physical examination and test results as part of my evaluation.

## 2015-10-12 ENCOUNTER — Inpatient Hospital Stay (HOSPITAL_COMMUNITY): Payer: Non-veteran care

## 2015-10-12 LAB — CBC WITH DIFFERENTIAL/PLATELET
BASOS PCT: 0 %
Basophils Absolute: 0 10*3/uL (ref 0.0–0.1)
EOS ABS: 0.3 10*3/uL (ref 0.0–0.7)
Eosinophils Relative: 2 %
HEMATOCRIT: 31.3 % — AB (ref 39.0–52.0)
Hemoglobin: 10 g/dL — ABNORMAL LOW (ref 13.0–17.0)
LYMPHS ABS: 2.5 10*3/uL (ref 0.7–4.0)
Lymphocytes Relative: 18 %
MCH: 30.7 pg (ref 26.0–34.0)
MCHC: 31.9 g/dL (ref 30.0–36.0)
MCV: 96 fL (ref 78.0–100.0)
MONO ABS: 2 10*3/uL — AB (ref 0.1–1.0)
Monocytes Relative: 14 %
NEUTROS ABS: 9.3 10*3/uL — AB (ref 1.7–7.7)
Neutrophils Relative %: 66 %
Platelets: 453 10*3/uL — ABNORMAL HIGH (ref 150–400)
RBC: 3.26 MIL/uL — ABNORMAL LOW (ref 4.22–5.81)
RDW: 14.4 % (ref 11.5–15.5)
WBC: 14.1 10*3/uL — ABNORMAL HIGH (ref 4.0–10.5)

## 2015-10-12 LAB — BASIC METABOLIC PANEL
ANION GAP: 7 (ref 5–15)
BUN: 21 mg/dL — ABNORMAL HIGH (ref 6–20)
CO2: 29 mmol/L (ref 22–32)
Calcium: 9.9 mg/dL (ref 8.9–10.3)
Chloride: 106 mmol/L (ref 101–111)
Creatinine, Ser: 0.5 mg/dL — ABNORMAL LOW (ref 0.61–1.24)
GFR calc Af Amer: >60 mL/min (ref >60)
GFR calc non Af Amer: >60 mL/min (ref >60)
GLUCOSE: 192 mg/dL — AB (ref 65–99)
POTASSIUM: 3.9 mmol/L (ref 3.5–5.1)
Sodium: 142 mmol/L (ref 135–145)

## 2015-10-12 LAB — GLUCOSE, CAPILLARY
Glucose-Capillary: 124 mg/dL — ABNORMAL HIGH (ref 65–99)
Glucose-Capillary: 135 mg/dL — ABNORMAL HIGH (ref 65–99)
Glucose-Capillary: 145 mg/dL — ABNORMAL HIGH (ref 65–99)
Glucose-Capillary: 148 mg/dL — ABNORMAL HIGH (ref 65–99)
Glucose-Capillary: 161 mg/dL — ABNORMAL HIGH (ref 65–99)
Glucose-Capillary: 166 mg/dL — ABNORMAL HIGH (ref 65–99)

## 2015-10-12 LAB — URINE MICROSCOPIC-ADD ON

## 2015-10-12 LAB — URINALYSIS, ROUTINE W REFLEX MICROSCOPIC
BILIRUBIN URINE: NEGATIVE
Glucose, UA: NEGATIVE mg/dL
HGB URINE DIPSTICK: NEGATIVE
Ketones, ur: NEGATIVE mg/dL
Leukocytes, UA: NEGATIVE
Nitrite: POSITIVE — AB
Protein, ur: NEGATIVE mg/dL
SPECIFIC GRAVITY, URINE: 1.027 (ref 1.005–1.030)
pH: 5.5 (ref 5.0–8.0)

## 2015-10-12 MED ORDER — INSULIN ASPART 100 UNIT/ML ~~LOC~~ SOLN
0.0000 [IU] | Freq: Three times a day (TID) | SUBCUTANEOUS | Status: DC
  Administered 2015-10-12: 1 [IU] via SUBCUTANEOUS

## 2015-10-12 MED ORDER — QUETIAPINE FUMARATE 25 MG PO TABS
25.0000 mg | ORAL_TABLET | Freq: Every day | ORAL | Status: DC
  Administered 2015-10-12: 25 mg
  Filled 2015-10-12 (×2): qty 1

## 2015-10-12 MED ORDER — INSULIN ASPART 100 UNIT/ML ~~LOC~~ SOLN
0.0000 [IU] | SUBCUTANEOUS | Status: DC
  Administered 2015-10-12 – 2015-10-13 (×9): 1 [IU] via SUBCUTANEOUS
  Administered 2015-10-14: 2 [IU] via SUBCUTANEOUS
  Administered 2015-10-14 – 2015-10-15 (×7): 1 [IU] via SUBCUTANEOUS
  Administered 2015-10-15: 2 [IU] via SUBCUTANEOUS
  Administered 2015-10-15 (×3): 1 [IU] via SUBCUTANEOUS
  Administered 2015-10-15 – 2015-10-16 (×4): 2 [IU] via SUBCUTANEOUS
  Administered 2015-10-16: 1 [IU] via SUBCUTANEOUS
  Administered 2015-10-16 – 2015-10-17 (×2): 2 [IU] via SUBCUTANEOUS
  Administered 2015-10-17: 1 [IU] via SUBCUTANEOUS
  Administered 2015-10-17: 2 [IU] via SUBCUTANEOUS
  Administered 2015-10-17 (×2): 1 [IU] via SUBCUTANEOUS
  Administered 2015-10-17: 2 [IU] via SUBCUTANEOUS
  Administered 2015-10-18: 1 [IU] via SUBCUTANEOUS
  Administered 2015-10-18 (×4): 2 [IU] via SUBCUTANEOUS
  Administered 2015-10-19 (×5): 1 [IU] via SUBCUTANEOUS
  Administered 2015-10-20: 2 [IU] via SUBCUTANEOUS
  Administered 2015-10-20 – 2015-10-22 (×11): 1 [IU] via SUBCUTANEOUS
  Administered 2015-10-22: 2 [IU] via SUBCUTANEOUS
  Administered 2015-10-23 (×3): 1 [IU] via SUBCUTANEOUS
  Administered 2015-10-23: 2 [IU] via SUBCUTANEOUS
  Administered 2015-10-23 – 2015-10-26 (×12): 1 [IU] via SUBCUTANEOUS
  Administered 2015-10-26: 2 [IU] via SUBCUTANEOUS
  Administered 2015-10-27 (×3): 1 [IU] via SUBCUTANEOUS
  Administered 2015-10-28: 2 [IU] via SUBCUTANEOUS
  Administered 2015-10-28 – 2015-10-29 (×4): 1 [IU] via SUBCUTANEOUS
  Administered 2015-10-29: 2 [IU] via SUBCUTANEOUS
  Administered 2015-10-29 – 2015-10-30 (×5): 1 [IU] via SUBCUTANEOUS
  Administered 2015-10-30: 2 [IU] via SUBCUTANEOUS
  Administered 2015-10-30 – 2015-10-31 (×8): 1 [IU] via SUBCUTANEOUS
  Administered 2015-11-01: 5 [IU] via SUBCUTANEOUS
  Administered 2015-11-01 – 2015-11-02 (×5): 1 [IU] via SUBCUTANEOUS
  Administered 2015-11-02: 2 [IU] via SUBCUTANEOUS
  Administered 2015-11-03: 1 [IU] via SUBCUTANEOUS
  Administered 2015-11-03 (×3): 2 [IU] via SUBCUTANEOUS
  Administered 2015-11-04: 1 [IU] via SUBCUTANEOUS
  Administered 2015-11-04: 2 [IU] via SUBCUTANEOUS
  Administered 2015-11-04: 1 [IU] via SUBCUTANEOUS
  Administered 2015-11-04: 2 [IU] via SUBCUTANEOUS
  Administered 2015-11-04 – 2015-11-05 (×2): 1 [IU] via SUBCUTANEOUS
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
## 2015-10-12 NOTE — Progress Notes (Signed)
Pt transported to and from MRI without incident 

## 2015-10-13 LAB — GLUCOSE, CAPILLARY
GLUCOSE-CAPILLARY: 140 mg/dL — AB (ref 65–99)
GLUCOSE-CAPILLARY: 124 mg/dL — AB (ref 65–99)
GLUCOSE-CAPILLARY: 142 mg/dL — AB (ref 65–99)
GLUCOSE-CAPILLARY: 145 mg/dL — AB (ref 65–99)
Glucose-Capillary: 134 mg/dL — ABNORMAL HIGH (ref 65–99)
Glucose-Capillary: 137 mg/dL — ABNORMAL HIGH (ref 65–99)

## 2015-10-13 MED ORDER — FENTANYL 25 MCG/HR TD PT72
25.0000 ug | MEDICATED_PATCH | TRANSDERMAL | Status: DC
  Administered 2015-10-13: 25 ug via TRANSDERMAL
  Filled 2015-10-13: qty 1

## 2015-10-13 MED ORDER — QUETIAPINE FUMARATE 50 MG PO TABS
50.0000 mg | ORAL_TABLET | Freq: Every day | ORAL | Status: DC
  Administered 2015-10-13 – 2015-10-19 (×7): 50 mg
  Filled 2015-10-13 (×7): qty 1

## 2015-10-13 MED ORDER — DEXTROSE 5 % IV SOLN
2.0000 g | INTRAVENOUS | Status: AC
Start: 2015-10-13 — End: 2015-10-15
  Administered 2015-10-13 – 2015-10-15 (×3): 2 g via INTRAVENOUS
  Filled 2015-10-13 (×3): qty 2

## 2015-10-13 MED ORDER — CHLORHEXIDINE GLUCONATE 0.12 % MT SOLN
OROMUCOSAL | Status: AC
  Administered 2015-10-13: 15 mL via OROMUCOSAL
  Filled 2015-10-13: qty 15

## 2015-10-13 MED ORDER — METOPROLOL TARTRATE 25 MG/10 ML ORAL SUSPENSION
50.0000 mg | Freq: Two times a day (BID) | ORAL | Status: DC
  Administered 2015-10-13 – 2015-10-19 (×13): 50 mg
  Filled 2015-10-13 (×13): qty 20

## 2015-10-13 NOTE — Progress Notes (Signed)
Wasted 28 mL of Versed in sink with Weston Brass, RN.
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
## 2015-10-13 NOTE — Progress Notes (Signed)
Urine culture was ordered this morning as an add-on to the UA that was ordered yesterday. Rocephin was hung at 0842 per the order. Contacted lab at noon when no results were pending under microbiology. Was informed that another specimen would need to be sent. Urine culture results may be affected by dose of abx given.

## 2015-10-14 LAB — BASIC METABOLIC PANEL
Anion gap: 10 (ref 5–15)
BUN: 20 mg/dL (ref 6–20)
CHLORIDE: 102 mmol/L (ref 101–111)
CO2: 30 mmol/L (ref 22–32)
CREATININE: 0.46 mg/dL — AB (ref 0.61–1.24)
Calcium: 10.5 mg/dL — ABNORMAL HIGH (ref 8.9–10.3)
GFR calc Af Amer: >60 mL/min (ref >60)
GFR calc non Af Amer: >60 mL/min (ref >60)
Glucose, Bld: 167 mg/dL — ABNORMAL HIGH (ref 65–99)
POTASSIUM: 3.9 mmol/L (ref 3.5–5.1)
SODIUM: 142 mmol/L (ref 135–145)

## 2015-10-14 LAB — GLUCOSE, CAPILLARY
GLUCOSE-CAPILLARY: 140 mg/dL — AB (ref 65–99)
GLUCOSE-CAPILLARY: 134 mg/dL — AB (ref 65–99)
GLUCOSE-CAPILLARY: 127 mg/dL — AB (ref 65–99)
Glucose-Capillary: 123 mg/dL — ABNORMAL HIGH (ref 65–99)
Glucose-Capillary: 135 mg/dL — ABNORMAL HIGH (ref 65–99)

## 2015-10-14 LAB — URINE CULTURE

## 2015-10-14 LAB — CULTURE, RESPIRATORY
CULTURE: NORMAL
SPECIAL REQUESTS: NORMAL

## 2015-10-14 LAB — CBC
HCT: 31.6 % — ABNORMAL LOW (ref 39.0–52.0)
HEMOGLOBIN: 9.7 g/dL — AB (ref 13.0–17.0)
MCH: 29.1 pg (ref 26.0–34.0)
MCHC: 30.7 g/dL (ref 30.0–36.0)
MCV: 94.9 fL (ref 78.0–100.0)
PLATELETS: 464 10*3/uL — AB (ref 150–400)
RBC: 3.33 MIL/uL — AB (ref 4.22–5.81)
RDW: 14.1 % (ref 11.5–15.5)
WBC: 13 10*3/uL — ABNORMAL HIGH (ref 4.0–10.5)

## 2015-10-14 LAB — CULTURE, RESPIRATORY W GRAM STAIN

## 2015-10-14 MED ORDER — ANTISEPTIC ORAL RINSE SOLUTION (CORINZ)
7.0000 mL | Freq: Four times a day (QID) | OROMUCOSAL | Status: DC
  Administered 2015-10-14 – 2015-11-06 (×67): 7 mL via OROMUCOSAL
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
## 2015-10-14 NOTE — Progress Notes (Signed)
[facility] Progress Note  5 Days Post-Op  Subjective: Patient in bed, just finished with PT. PT states patient initiates movements and sits up on edge of the bed but has minimal activity in UE BL.   More alert today. Non-agitated. Weaning well. On trach collar. Good urine output.   Objective: Vital signs in last 24 hours: Temp:  [98.2 F (36.8 C)-99.6 F (37.6 C)] 98.4 F (36.9 C) (01/30 0800) Pulse Rate:  [107-130] 118 (01/30 0800) Resp:  [15-33] 31 (01/30 0800) BP: (109-147)/(67-103) 133/93 mmHg (01/30 0800) SpO2:  [95 %-100 %] 100 % (01/30 0800) FiO2 (%):  [35 %-40 %] 35 % (01/30 0800) Weight:  [63.4 kg (139 lb 12.4 oz)] 63.4 kg (139 lb 12.4 oz) (01/30 0500) Last BM Date: 10/13/15  Intake/Output from previous day: 01/29 0701 - 01/30 0700 In: 2114.8 [I.V.:624.8; ST:[id number]; IV Piggyback:50] Out: 2575 [Urine:2575] Intake/Output this shift: Total I/O In: 80 [I.V.:20; NG/GT:60] Out: -   PE: General appearance: awake, NAD Resp: clear to auscultation bilaterally Cardio: regular rate and rhythm, S1, S2 normal, no m/r/g GI: soft, non-tender; bowel sounds normal; no masses, no organomegaly Extremities: weak upper and lower extremities. Minimal movement of left arm. Will squeeze my fingers with his hands BL but very weak.   Lab Results:   Recent Labs  10/12/15 0430 10/14/15 0500  WBC 14.1* 13.0*  HGB 10.0* 9.7*  HCT 31.3* 31.6*  PLT 453* 464*   BMET  Recent Labs  10/12/15 0430 10/14/15 0500  NA 142 142  K 3.9 3.9  CL 106 102  CO2 29 30  GLUCOSE 192* 167*  BUN 21* 20  CREATININE 0.50* 0.46*  CALCIUM 9.9 10.5*   CMP     Component Value Date/Time   NA 142 10/14/2015 0500   K 3.9 10/14/2015 0500   CL 102 10/14/2015 0500   CO2 30 10/14/2015 0500   GLUCOSE 167* 10/14/2015 0500   BUN 20 10/14/2015 0500   CREATININE 0.46* 10/14/2015 0500   CALCIUM 10.5* 10/14/2015 0500   PROT 5.0* 09/21/2015 0446   ALBUMIN 2.5* 09/21/2015 0446   AST 102*  09/21/2015 0446   ALT 51 09/21/2015 0446   ALKPHOS 82 09/21/2015 0446   BILITOT 1.4* 09/21/2015 0446   GFRNONAA >60 10/14/2015 0500   GFRAA >60 10/14/2015 0500   Lipase     Component Value Date/Time   LIPASE 12 09/20/2015 1958   Anti-infectives: Anti-infectives    Start     Dose/Rate Route Frequency Ordered Stop   10/13/15 0800  cefTRIAXone (ROCEPHIN) 2 g in dextrose 5 % 50 mL IVPB    Comments:  Pharmacy may adjust dosing strength / duration / interval for maximal efficacy   2 g 100 mL/hr over 30 Minutes Intravenous Every 24 hours 10/13/15 0757 10/16/15 0759   09/20/15 1700  piperacillin-tazobactam (ZOSYN) IVPB 3.375 g     3.375 g 12.5 mL/hr over 240 Minutes Intravenous Every 8 hours 09/20/15 1540 09/26/15 2117     Assessment/Plan [facility] Multiple left rib fxs w/pulmonary contusions - L CT out and no PTX Left renal contusion ARF w/ARDS - S/P trach, bronchodilators, fentanyl patch and increased seroquel to help with weaning.  ABL anemia - stable ID- rocephin for UTI, culture pending Hyperglycemia-add SSI Penile pressure wound - appreciate WOC eval FEN - TF, Klonopin, increase seroquel CV - tachycardia - lopressor VTE - SCD's, Lovenox Dispo - ICU, continue therapies   LOS: 26 days    [name] [name] 10/14/2015, 8:52 AM Pager: [phone]    

## 2015-10-14 NOTE — Progress Notes (Signed)
Pt bleeding from trach inserttion site. Dr Donne Hazel came and packed site with surgiceal. Will cont to monitor.

## 2015-10-15 LAB — GLUCOSE, CAPILLARY
GLUCOSE-CAPILLARY: 159 mg/dL — AB (ref 65–99)
GLUCOSE-CAPILLARY: 127 mg/dL — AB (ref 65–99)
GLUCOSE-CAPILLARY: 140 mg/dL — AB (ref 65–99)
GLUCOSE-CAPILLARY: 147 mg/dL — AB (ref 65–99)
GLUCOSE-CAPILLARY: 139 mg/dL — AB (ref 65–99)
Glucose-Capillary: 169 mg/dL — ABNORMAL HIGH (ref 65–99)
Glucose-Capillary: 148 mg/dL — ABNORMAL HIGH (ref 65–99)

## 2015-10-15 LAB — CBC
HEMATOCRIT: 30.6 % — AB (ref 39.0–52.0)
HEMOGLOBIN: 10.3 g/dL — AB (ref 13.0–17.0)
MCH: 31.4 pg (ref 26.0–34.0)
MCHC: 33.7 g/dL (ref 30.0–36.0)
MCV: 93.3 fL (ref 78.0–100.0)
Platelets: 491 10*3/uL — ABNORMAL HIGH (ref 150–400)
RBC: 3.28 MIL/uL — AB (ref 4.22–5.81)
RDW: 14.1 % (ref 11.5–15.5)
WBC: 13.7 10*3/uL — ABNORMAL HIGH (ref 4.0–10.5)

## 2015-10-15 MED ORDER — FENTANYL 50 MCG/HR TD PT72: 75.0000 ug | MEDICATED_PATCH | TRANSDERMAL | Status: DC

## 2015-10-15 MED ORDER — CLONAZEPAM 0.5 MG PO TBDP
2.0000 mg | ORAL_TABLET | Freq: Two times a day (BID) | ORAL | Status: DC
  Administered 2015-10-15 – 2015-10-19 (×9): 2 mg
  Filled 2015-10-15 (×9): qty 4

## 2015-10-15 MED ORDER — HYDROCODONE-ACETAMINOPHEN 7.5-325 MG/15ML PO SOLN
15.0000 mL | ORAL | Status: DC | PRN
  Administered 2015-10-15 – 2015-10-17 (×4): 15 mL via ORAL
  Filled 2015-10-15 (×4): qty 15

## 2015-10-15 MED ORDER — MORPHINE SULFATE (PF) 2 MG/ML IV SOLN
2.0000 mg | INTRAVENOUS | Status: DC | PRN
  Administered 2015-10-15 – 2015-10-27 (×14): 2 mg via INTRAVENOUS
  Filled 2015-10-15 (×14): qty 1

## 2015-10-15 MED ORDER — FENTANYL 25 MCG/HR TD PT72
75.0000 ug | MEDICATED_PATCH | TRANSDERMAL | Status: AC
  Administered 2015-10-15 – 2015-10-18 (×2): 75 ug via TRANSDERMAL
  Filled 2015-10-15: qty 3
  Filled 2015-10-15 (×3): qty 1

## 2015-10-15 MED ORDER — ADULT MULTIVITAMIN LIQUID CH
5.0000 mL | Freq: Every day | ORAL | Status: DC
  Administered 2015-10-15 – 2015-10-16 (×2): 5 mL via ORAL
  Filled 2015-10-15 (×5): qty 5

## 2015-10-15 MED ORDER — CLONAZEPAM 0.5 MG PO TBDP: 2.0000 mg | ORAL_TABLET | Freq: Two times a day (BID) | ORAL | Status: DC

## 2015-10-15 NOTE — Progress Notes (Signed)
Spoke with [name], [facility] transfer coordinator:  He states that pt is being evaluated for either transfer to [facility] or [facility]  Pt's only [facility]or is [facility], and his case has been sent to [name] for review.  [name] states decision should be made by tomorrow, 2/1 on disposition, and he is hopeful for LTAC in [place], as pt's family is in [place].  Will follow up with [facility] transfer coordinator tomorrow regarding placement.    Updated pt's daughter, [name] [phone]) regarding information from the [place].  She states her first choice would be [facility].  She is interested in getting POA paperwork done ASAP, and she has asked me to check on possibility of having this done.  Pt is off sedation currently.  Will check with chaplain and see if this is possible.    Will follow up.  [name], RN, BSN  Trauma/Neuro ICU Case Manager [phone]
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
## 2015-10-15 NOTE — Progress Notes (Addendum)
Patient ID: [name] [name], male   DOB: [birth year], 65 y.o.   MRN: [id number] ctsp for bleeding around trach site.  Perc trach five days ago.  No history infection. Upon evaluation he has some oozing (had pressure applied) around his trach site. This had already stopped with some   Inflated cuff some and applied some surgicel underneath this. I dont think this is ti fistula.  Suctioning was clear.  I think that for ti fistula bleeding would have not stopped with some external pressure. I think that is likely granulation tissue or at level of trach and not more (including herald bleed). I think reasonable to just follow for now and not pursue bronch/cta at this point given response and appearance.
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
## 2015-10-15 NOTE — Progress Notes (Signed)
Wasted 45cc of Fentanyl from IV drip since was dc'd. Witnessed by Ezra Sites RN

## 2015-10-15 NOTE — Progress Notes (Signed)
Patient ID: [name] [name], male   DOB: 09-17-1950, 65 y.o.   MRN: [id number] 6 Days Post-Op  Subjective: Remains on HTC  Objective: Vital signs in last 24 hours: Temp:  [97.7 F (36.5 C)-98.8 F (37.1 C)] 98.7 F (37.1 C) (01/31 0400) Pulse Rate:  [104-124] 118 (01/31 0800) Resp:  [18-37] 34 (01/31 0800) BP: (102-142)/(67-113) 116/79 mmHg (01/31 0800) SpO2:  [94 %-100 %] 100 % (01/31 0800) FiO2 (%):  [28 %-35 %] 28 % (01/31 0800) Weight:  [65.5 kg (144 lb 6.4 oz)] 65.5 kg (144 lb 6.4 oz) (01/31 0459) Last BM Date: 10/14/15  Intake/Output from previous day: 01/30 0701 - 01/31 0700 In: 2160 [I.V.:480; NG/GT:1680] Out: 1260 [Urine:1260] Intake/Output this shift:    General appearance: no distress Neck: trach in place, no bleeding Cardio: regular rate and rhythm GI: PEG in place, soft Neuro: F/C, moving BUE a bit more  Lab Results: CBC   Recent Labs  10/14/15 0500 10/15/15 0646  WBC 13.0* 13.7*  HGB 9.7* 10.3*  HCT 31.6* 30.6*  PLT 464* 491*   BMET  Recent Labs  10/14/15 0500  NA 142  K 3.9  CL 102  CO2 30  GLUCOSE 167*  BUN 20  CREATININE 0.46*  CALCIUM 10.5*   PT/INR No results for input(s): LABPROT, INR in the last 72 hours. ABG No results for input(s): PHART, HCO3 in the last 72 hours.  Invalid input(s): PCO2, PO2  Studies/Results: No results found.  Anti-infectives: Anti-infectives    Start     Dose/Rate Route Frequency Ordered Stop   10/13/15 0800  cefTRIAXone (ROCEPHIN) 2 g in dextrose 5 % 50 mL IVPB    Comments:  Pharmacy may adjust dosing strength / duration / interval for maximal efficacy   2 g 100 mL/hr over 30 Minutes Intravenous Every 24 hours 10/13/15 0757 10/16/15 0759   09/20/15 1700  piperacillin-tazobactam (ZOSYN) IVPB 3.375 g     3.375 g 12.5 mL/hr over 240 Minutes Intravenous Every 8 hours 09/20/15 1540 09/26/15 2117     Results for orders placed or performed during the hospital encounter of 09/18/15  MRSA PCR  Screening     Status: None   Collection Time: 09/19/15  4:46 AM  Result Value Ref Range Status   MRSA by PCR NEGATIVE NEGATIVE Final    Comment:        The GeneXpert MRSA Assay (FDA approved for NASAL specimens only), is one component of a comprehensive MRSA colonization surveillance program. It is not intended to diagnose MRSA infection nor to guide or monitor treatment for MRSA infections.   C difficile quick scan w PCR reflex     Status: None   Collection Time: 09/22/15  9:15 AM  Result Value Ref Range Status   C Diff antigen NEGATIVE NEGATIVE Final   C Diff toxin NEGATIVE NEGATIVE Final   C Diff interpretation Negative for toxigenic C. difficile  Final  Culture, respiratory (NON-Expectorated)     Status: None   Collection Time: 09/23/15  8:56 AM  Result Value Ref Range Status   Specimen Description TRACHEAL ASPIRATE  Final   Special Requests Normal  Final   Gram Stain   Final    ABUNDANT WBC PRESENT, PREDOMINANTLY PMN RARE SQUAMOUS EPITHELIAL CELLS PRESENT NO ORGANISMS SEEN Performed at [facility]    Culture   Final    NORMAL OROPHARYNGEAL FLORA Performed at [facility]    Report Status 09/25/2015 FINAL  Final  Culture, respiratory (NON-Expectorated)  Status: None   Collection Time: 10/11/15 10:27 AM  Result Value Ref Range Status   Specimen Description TRACHEAL ASPIRATE  Final   Special Requests Normal  Final   Gram Stain   Final    ABUNDANT WBC PRESENT,BOTH PMN AND MONONUCLEAR MODERATE SQUAMOUS EPITHELIAL CELLS PRESENT ABUNDANT GRAM NEGATIVE RODS FEW GRAM POSITIVE RODS Performed at [facility]    Culture   Final    NORMAL OROPHARYNGEAL FLORA Performed at [facility]    Report Status 10/14/2015 FINAL  Final  Culture, Urine     Status: None   Collection Time: 10/13/15 12:49 PM  Result Value Ref Range Status   Specimen Description URINE, RANDOM  Final   Special Requests NONE  Final   Culture 9,000 COLONIES/mL  INSIGNIFICANT GROWTH  Final   Report Status 10/14/2015 FINAL  Final     Assessment/Plan: [facility] Multiple left rib fxs w/pulmonary contusions - L CT out and no PTX Left renal contusion ARF w/ARDS - doing well on HTC 28% ABL anemia - stable ID- urine CX with insignificant growth so D/C Rocephin Hyperglycemia -  SSI FEN - TF, increase Klonopin and fentanyl patch to D/C fentanyl drip. Add lortab elixir. Speech therapy/passy muir CV - tachycardia - lopressor VTE - SCD's, Lovenox Dispo - ICU  LOS: 27 days    Georganna Skeans, MD, MPH, FACS Trauma: [phone] General Surgery: [phone]  10/15/2015
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
## 2015-10-16 LAB — BASIC METABOLIC PANEL
Anion gap: 12 (ref 5–15)
BUN: 25 mg/dL — AB (ref 6–20)
CALCIUM: 10.2 mg/dL (ref 8.9–10.3)
CO2: 27 mmol/L (ref 22–32)
Chloride: 107 mmol/L (ref 101–111)
Creatinine, Ser: 0.51 mg/dL — ABNORMAL LOW (ref 0.61–1.24)
GFR calc Af Amer: >60 mL/min (ref >60)
GLUCOSE: 194 mg/dL — AB (ref 65–99)
POTASSIUM: 3.6 mmol/L (ref 3.5–5.1)
Sodium: 146 mmol/L — ABNORMAL HIGH (ref 135–145)

## 2015-10-16 LAB — GLUCOSE, CAPILLARY
GLUCOSE-CAPILLARY: 154 mg/dL — AB (ref 65–99)
GLUCOSE-CAPILLARY: 163 mg/dL — AB (ref 65–99)
Glucose-Capillary: 154 mg/dL — ABNORMAL HIGH (ref 65–99)
Glucose-Capillary: 193 mg/dL — ABNORMAL HIGH (ref 65–99)
Glucose-Capillary: 144 mg/dL — ABNORMAL HIGH (ref 65–99)
Glucose-Capillary: 168 mg/dL — ABNORMAL HIGH (ref 65–99)

## 2015-10-16 MED ORDER — LORAZEPAM 2 MG/ML IJ SOLN
1.0000 mg | INTRAMUSCULAR | Status: DC | PRN
Start: 2015-10-16 — End: 2015-10-23
  Administered 2015-10-17 – 2015-10-23 (×9): 1 mg via INTRAVENOUS
  Filled 2015-10-16 (×9): qty 1

## 2015-10-16 MED ORDER — PIPERACILLIN-TAZOBACTAM 3.375 G IVPB
3.3750 g | Freq: Three times a day (TID) | INTRAVENOUS | Status: DC
  Administered 2015-10-16 – 2015-10-21 (×15): 3.375 g via INTRAVENOUS
  Filled 2015-10-16 (×19): qty 50

## 2015-10-16 NOTE — Progress Notes (Signed)
Follow up - Trauma and Critical Care  Patient Details:    [name] [name] is an 65 y.o. male.  Lines/tubes : PICC Double Lumen [id number] PICC Right Basilic 48 cm 3 cm (Active)  Indication for Insertion or Continuance of Line Prolonged intravenous therapies 10/15/2015 11:00 PM  Exposed Catheter (cm) 3 cm 10/02/2015 12:00 PM  Site Assessment Clean;Dry;Intact 10/15/2015 11:00 PM  Lumen #1 Status Blood return noted;Flushed 10/16/2015  5:00 AM  Lumen #2 Status Blood return noted;Flushed 10/16/2015  5:00 AM  Dressing Type Transparent 10/15/2015 11:00 PM  Dressing Status Clean;Dry;Intact;Antimicrobial disc in place 10/15/2015 11:00 PM  Line Care Cap(s) changed;Tubing changed 10/16/2015  5:00 AM  Dressing Intervention Dressing changed;Antimicrobial disc changed 10/16/2015  5:00 AM  Dressing Change Due 10/23/15 10/16/2015  5:00 AM     Gastrostomy/Enterostomy Percutaneous endoscopic gastrostomy (PEG) (Active)  Surrounding Skin Intact 10/15/2015  8:00 PM  Tube Status Patent 10/15/2015  8:00 PM  Catheter Position (cm marking) 3.5 cm 10/13/2015  8:00 AM  Dressing Status Clean;Dry;Intact 10/15/2015  8:00 AM  Dressing Intervention Dressing changed 10/15/2015 11:24 PM  Dressing Type Split gauze 10/15/2015 11:24 PM     External Urinary Catheter (Active)  Collection Container Standard drainage bag 10/15/2015 11:24 PM  Securement Method Securing device (Describe) 10/15/2015 11:24 PM  Output (mL) 800 mL 10/15/2015  6:00 PM    Microbiology/Sepsis markers: Results for orders placed or performed during the hospital encounter of 09/18/15  MRSA PCR Screening     Status: None   Collection Time: 09/19/15  4:46 AM  Result Value Ref Range Status   MRSA by PCR NEGATIVE NEGATIVE Final    Comment:        The GeneXpert MRSA Assay (FDA approved for NASAL specimens only), is one component of a comprehensive MRSA colonization surveillance program. It is not intended to diagnose MRSA infection nor to guide or monitor treatment  for MRSA infections.   C difficile quick scan w PCR reflex     Status: None   Collection Time: 09/22/15  9:15 AM  Result Value Ref Range Status   C Diff antigen NEGATIVE NEGATIVE Final   C Diff toxin NEGATIVE NEGATIVE Final   C Diff interpretation Negative for toxigenic C. difficile  Final  Culture, respiratory (NON-Expectorated)     Status: None   Collection Time: 09/23/15  8:56 AM  Result Value Ref Range Status   Specimen Description TRACHEAL ASPIRATE  Final   Special Requests Normal  Final   Gram Stain   Final    ABUNDANT WBC PRESENT, PREDOMINANTLY PMN RARE SQUAMOUS EPITHELIAL CELLS PRESENT NO ORGANISMS SEEN Performed at [facility]    Culture   Final    NORMAL OROPHARYNGEAL FLORA Performed at [facility]    Report Status 09/25/2015 FINAL  Final  Culture, respiratory (NON-Expectorated)     Status: None   Collection Time: 10/11/15 10:27 AM  Result Value Ref Range Status   Specimen Description TRACHEAL ASPIRATE  Final   Special Requests Normal  Final   Gram Stain   Final    ABUNDANT WBC PRESENT,BOTH PMN AND MONONUCLEAR MODERATE SQUAMOUS EPITHELIAL CELLS PRESENT ABUNDANT GRAM NEGATIVE RODS FEW GRAM POSITIVE RODS Performed at [facility]    Culture   Final    NORMAL OROPHARYNGEAL FLORA Performed at [facility]    Report Status 10/14/2015 FINAL  Final  Culture, Urine     Status: None   Collection Time: 10/13/15 12:49 PM  Result Value Ref Range Status  Specimen Description URINE, RANDOM  Final   Special Requests NONE  Final   Culture 9,000 COLONIES/mL INSIGNIFICANT GROWTH  Final   Report Status 10/14/2015 FINAL  Final    Anti-infectives:  Anti-infectives    Start     Dose/Rate Route Frequency Ordered Stop   10/13/15 0800  cefTRIAXone (ROCEPHIN) 2 g in dextrose 5 % 50 mL IVPB    Comments:  Pharmacy may adjust dosing strength / duration / interval for maximal efficacy   2 g 100 mL/hr over 30 Minutes Intravenous Every 24 hours  10/13/15 0757 10/15/15 0915   09/20/15 1700  piperacillin-tazobactam (ZOSYN) IVPB 3.375 g     3.375 g 12.5 mL/hr over 240 Minutes Intravenous Every 8 hours 09/20/15 1540 09/26/15 2117      Best Practice/Protocols:  VTE Prophylaxis: Lovenox (prophylaxtic dose) and (SCDs) GI Prophylaxis: Proton Pump Inhibitor Continous Sedation  Consults:      Events:  Subjective:    Overnight Issues: Getting Versed intermittently.  Has been leaking from his tracheostomy since he was placed back on the ventilitor last night.  Objective:  Vital signs for last 24 hours: Temp:  [98 F (36.7 C)-99.8 F (37.7 C)] 98.5 F (36.9 C) (02/01 0733) Pulse Rate:  [107-120] 120 (02/01 0900) Resp:  [21-47] 29 (02/01 0900) BP: (109-149)/(78-103) 114/78 mmHg (02/01 0900) SpO2:  [95 %-100 %] 100 % (02/01 0900) FiO2 (%):  [28 %-40 %] 40 % (02/01 0800) Weight:  [61.8 kg (136 lb 3.9 oz)] 61.8 kg (136 lb 3.9 oz) (02/01 0500)  Hemodynamic parameters for last 24 hours:    Intake/Output from previous day: 01/31 0701 - 02/01 0700 In: 1320 [NG/GT:1320] Out: 1025 [Urine:1025]  Intake/Output this shift: Total I/O In: 140 [I.V.:20; NG/GT:120] Out: -   Vent settings for last 24 hours: Vent Mode:  [-] PRVC FiO2 (%):  [28 %-40 %] 40 % Set Rate:  [18 bmp] 18 bmp Vt Set:  [560 mL] 560 mL PEEP:  [5 cmH20] 5 cmH20 Plateau Pressure:  [7 [id number] cmH20] 7 cmH20  Physical Exam:  General: alert and some distress prior to tracheostomy being adjusted Neuro: alert, weakness right upper extremity and weakness left upper extremity Resp: clear to auscultation bilaterally and but reports that he was very rhonchorous CVS: Sinus tachycardia GI: soft, nontender, BS WNL, no r/g Extremities: no edema, no erythema, pulses WNL  Results for orders placed or performed during the hospital encounter of 09/18/15 (from the past 24 hour(s))  Glucose, capillary     Status: Abnormal   Collection Time: 10/15/15 12:03 PM  Result  Value Ref Range   Glucose-Capillary 139 (H) 65 - 99 mg/dL   Comment 1 Notify RN    Comment 2 Document in Chart   Glucose, capillary     Status: Abnormal   Collection Time: 10/15/15  3:31 PM  Result Value Ref Range   Glucose-Capillary 169 (H) 65 - 99 mg/dL   Comment 1 Notify RN    Comment 2 Document in Chart   Glucose, capillary     Status: Abnormal   Collection Time: 10/15/15  7:55 PM  Result Value Ref Range   Glucose-Capillary 148 (H) 65 - 99 mg/dL  Glucose, capillary     Status: Abnormal   Collection Time: 10/15/15 11:48 PM  Result Value Ref Range   Glucose-Capillary 163 (H) 65 - 99 mg/dL  Glucose, capillary     Status: Abnormal   Collection Time: 10/16/15  3:52 AM  Result Value Ref Range  Glucose-Capillary 154 (H) 65 - 99 mg/dL  Basic metabolic panel     Status: Abnormal   Collection Time: 10/16/15  5:50 AM  Result Value Ref Range   Sodium 146 (H) 135 - 145 mmol/L   Potassium 3.6 3.5 - 5.1 mmol/L   Chloride 107 101 - 111 mmol/L   CO2 27 22 - 32 mmol/L   Glucose, Bld 194 (H) 65 - 99 mg/dL   BUN 25 (H) 6 - 20 mg/dL   Creatinine, Ser 0.51 (L) 0.61 - 1.24 mg/dL   Calcium 10.2 8.9 - 10.3 mg/dL   GFR calc non Af Amer >60 >60 mL/min   GFR calc Af Amer >60 >60 mL/min   Anion gap 12 5 - 15  Glucose, capillary     Status: Abnormal   Collection Time: 10/16/15  7:22 AM  Result Value Ref Range   Glucose-Capillary 154 (H) 65 - 99 mg/dL   Comment 1 Notify RN    Comment 2 Document in Chart      Assessment/Plan:   NEURO  Altered Mental Status:  agitation and sedation   Plan: Patient intermittently agitated  PULM  Atelectasis/collapse (focal and without consolidation.)   Plan: Try to wean to trach coollar again today.  He has been leaking air ever since being placed back on the ventilator.  [name] had partially come out.  CARDIO  Sinus Tachycardia   Plan: No specific treatment.  RENAL  Urine output and renal fuction are good   Plan: CPM  GI  No specific treatment.    Plan: Tolerating tube feeding   ID  No known infectious problems.   Plan: CPM  HEME  Anemia acute blood loss anemia and anemia of critical illness)   Plan: No specific management  ENDO No known problems.   Plan: CPM  Global Issues  Patient on trach collar again, but RR is high.  If he does not tolerate being off the ventilator, made need longer tracheostomy placed.    LOS: 28 days   Additional comments:I reviewed the patient's new clinical lab test results. cbc/bmet  Critical Care Total Time*: 30 Minutes  Kaleena Corrow 10/16/2015  *Care during the described time interval was provided by me and/or other providers on the critical care team.  I have reviewed this patient's available data, including medical history, events of note, physical examination and test results as part of my evaluation.
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
## 2015-10-16 NOTE — Progress Notes (Signed)
Pt has had increased RR in the 40s and higher throughout the entire night. Pt was placed back on full ventilator support at 0145. Patient is noted to have a significant leak, despite increasing pressure in cuff, checking trach position and checking for leaks in tubing. Patient possibly needs a XLT trach. RT will continue to monitor.

## 2015-10-16 NOTE — Progress Notes (Signed)
RT to assess patient breathing multiple times after being placed back on ventilator. They state there is a cuff leak and the patient would benefit from a longer trach. All of patient's other vitals are stable at this time. Will monitor and report to oncoming RN to discuss with rounding physician.

## 2015-10-16 NOTE — Progress Notes (Signed)
Dr. Rosendo Gros notified of patient being tachypnic over the past few hours in 84's. Instructed to place patient back on ventilator to rest for the night. Will continue to monitor.
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
## 2015-10-16 NOTE — Progress Notes (Signed)
Spoke with [name], [facility] transfer coordinator to discuss disposition issues.  Mr. [name] [name] continues to await review of case by his Freight forwarder and Insurance risk surveyor.  He states we may not have word until tomorrow, as his manager is out of the office today.  He states Market researcher will likely need to speak with attending physician upon review, and will notify this case manager if needed.   Will follow up with [facility] coordinator in AM.  He is aware that pt was back on ventilator overnight, and will pass on to medical director.    [name], RN, BSN  Trauma/Neuro ICU Case Manager [phone]
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
## 2015-10-16 NOTE — Progress Notes (Signed)
Dr came in and removed suctures and advanced tube.  VC improved.

## 2015-10-17 ENCOUNTER — Inpatient Hospital Stay (HOSPITAL_COMMUNITY): Payer: Non-veteran care

## 2015-10-17 LAB — CBC WITH DIFFERENTIAL/PLATELET
Basophils Absolute: 0.1 10*3/uL (ref 0.0–0.1)
Basophils Relative: 0 %
EOS ABS: 0.4 10*3/uL (ref 0.0–0.7)
Eosinophils Relative: 3 %
HEMATOCRIT: 34.6 % — AB (ref 39.0–52.0)
HEMOGLOBIN: 11.2 g/dL — AB (ref 13.0–17.0)
LYMPHS ABS: 2.2 10*3/uL (ref 0.7–4.0)
Lymphocytes Relative: 17 %
MCH: 30.6 pg (ref 26.0–34.0)
MCHC: 32.4 g/dL (ref 30.0–36.0)
MCV: 94.5 fL (ref 78.0–100.0)
MONO ABS: 1.2 10*3/uL — AB (ref 0.1–1.0)
MONOS PCT: 9 %
NEUTROS ABS: 8.9 10*3/uL — AB (ref 1.7–7.7)
NEUTROS PCT: 71 %
Platelets: 456 10*3/uL — ABNORMAL HIGH (ref 150–400)
RBC: 3.66 MIL/uL — ABNORMAL LOW (ref 4.22–5.81)
RDW: 14.3 % (ref 11.5–15.5)
WBC: 12.7 10*3/uL — ABNORMAL HIGH (ref 4.0–10.5)

## 2015-10-17 LAB — BASIC METABOLIC PANEL
Anion gap: 10 (ref 5–15)
BUN: 25 mg/dL — AB (ref 6–20)
CALCIUM: 9.9 mg/dL (ref 8.9–10.3)
CHLORIDE: 111 mmol/L (ref 101–111)
CO2: 26 mmol/L (ref 22–32)
CREATININE: 0.47 mg/dL — AB (ref 0.61–1.24)
GFR calc Af Amer: >60 mL/min (ref >60)
GFR calc non Af Amer: >60 mL/min (ref >60)
GLUCOSE: 162 mg/dL — AB (ref 65–99)
Potassium: 3.9 mmol/L (ref 3.5–5.1)
Sodium: 147 mmol/L — ABNORMAL HIGH (ref 135–145)

## 2015-10-17 LAB — GLUCOSE, CAPILLARY
GLUCOSE-CAPILLARY: 137 mg/dL — AB (ref 65–99)
GLUCOSE-CAPILLARY: 154 mg/dL — AB (ref 65–99)
GLUCOSE-CAPILLARY: 129 mg/dL — AB (ref 65–99)
Glucose-Capillary: 175 mg/dL — ABNORMAL HIGH (ref 65–99)
Glucose-Capillary: 158 mg/dL — ABNORMAL HIGH (ref 65–99)
Glucose-Capillary: 121 mg/dL — ABNORMAL HIGH (ref 65–99)

## 2015-10-17 MED ORDER — FREE WATER
200.0000 mL | Freq: Three times a day (TID) | Status: DC
  Administered 2015-10-17 – 2015-10-18 (×4): 200 mL

## 2015-10-17 MED ORDER — CHLORHEXIDINE GLUCONATE 0.12 % MT SOLN
OROMUCOSAL | Status: AC
  Filled 2015-10-17: qty 15

## 2015-10-17 MED ORDER — ENOXAPARIN SODIUM 40 MG/0.4ML ~~LOC~~ SOLN
40.0000 mg | SUBCUTANEOUS | Status: DC
  Administered 2015-10-17 – 2015-11-07 (×22): 40 mg via SUBCUTANEOUS
  Filled 2015-10-17 (×23): qty 0.4

## 2015-10-17 MED ORDER — ADULT MULTIVITAMIN W/MINERALS CH
1.0000 | ORAL_TABLET | Freq: Every day | ORAL | Status: DC
  Administered 2015-10-17 – 2015-11-07 (×22): 1 via ORAL
  Filled 2015-10-17 (×22): qty 1

## 2015-10-17 NOTE — Progress Notes (Signed)
Patient ID: [name] [name], male   DOB: 1950/10/16, 65 y.o.   MRN: [id number] Follow up - Trauma Critical Care  Patient Details:    [name] [name] is an 65 y.o. male.  Lines/tubes : PICC Double Lumen [id number] PICC Right Basilic 48 cm 3 cm (Active)  Indication for Insertion or Continuance of Line Prolonged intravenous therapies 10/16/2015  8:00 PM  Exposed Catheter (cm) 3 cm 10/02/2015 12:00 PM  Site Assessment Clean;Dry;Intact 10/16/2015  8:00 PM  Lumen #1 Status Blood return noted;Flushed;Infusing 10/16/2015  8:00 PM  Lumen #2 Status Blood return noted;Flushed 10/16/2015  8:00 PM  Dressing Type Transparent;Occlusive 10/16/2015  8:00 PM  Dressing Status Clean;Dry;Intact;Antimicrobial disc in place 10/16/2015  8:00 PM  Line Care Cap(s) changed;Tubing changed 10/16/2015  5:00 AM  Dressing Intervention Dressing changed;Antimicrobial disc changed 10/16/2015  5:00 AM  Dressing Change Due 10/23/15 10/16/2015  8:00 PM     Gastrostomy/Enterostomy Percutaneous endoscopic gastrostomy (PEG) (Active)  Surrounding Skin Intact 10/16/2015  8:00 PM  Tube Status Patent 10/16/2015  8:00 PM  Drainage Appearance Tan 10/16/2015  8:00 PM  Catheter Position (cm marking) 3.5 cm 10/13/2015  8:00 AM  Dressing Status Clean;Dry;Intact 10/16/2015  8:00 PM  Dressing Intervention Dressing changed 10/16/2015  8:00 AM  Dressing Type Split gauze 10/16/2015  8:00 PM     External Urinary Catheter (Active)  Collection Container Standard drainage bag 10/16/2015  8:00 PM  Securement Method Securing device (Describe) 10/16/2015  8:00 PM  Output (mL) 800 mL 10/17/2015  6:00 AM    Microbiology/Sepsis markers: Results for orders placed or performed during the hospital encounter of 09/18/15  MRSA PCR Screening     Status: None   Collection Time: 09/19/15  4:46 AM  Result Value Ref Range Status   MRSA by PCR NEGATIVE NEGATIVE Final    Comment:        The GeneXpert MRSA Assay (FDA approved for NASAL specimens only), is one component of  a comprehensive MRSA colonization surveillance program. It is not intended to diagnose MRSA infection nor to guide or monitor treatment for MRSA infections.   C difficile quick scan w PCR reflex     Status: None   Collection Time: 09/22/15  9:15 AM  Result Value Ref Range Status   C Diff antigen NEGATIVE NEGATIVE Final   C Diff toxin NEGATIVE NEGATIVE Final   C Diff interpretation Negative for toxigenic C. difficile  Final  Culture, respiratory (NON-Expectorated)     Status: None   Collection Time: 09/23/15  8:56 AM  Result Value Ref Range Status   Specimen Description TRACHEAL ASPIRATE  Final   Special Requests Normal  Final   Gram Stain   Final    ABUNDANT WBC PRESENT, PREDOMINANTLY PMN RARE SQUAMOUS EPITHELIAL CELLS PRESENT NO ORGANISMS SEEN Performed at [facility]    Culture   Final    NORMAL OROPHARYNGEAL FLORA Performed at [facility]    Report Status 09/25/2015 FINAL  Final  Culture, respiratory (NON-Expectorated)     Status: None   Collection Time: 10/11/15 10:27 AM  Result Value Ref Range Status   Specimen Description TRACHEAL ASPIRATE  Final   Special Requests Normal  Final   Gram Stain   Final    ABUNDANT WBC PRESENT,BOTH PMN AND MONONUCLEAR MODERATE SQUAMOUS EPITHELIAL CELLS PRESENT ABUNDANT GRAM NEGATIVE RODS FEW GRAM POSITIVE RODS Performed at [facility]    Culture   Final    NORMAL OROPHARYNGEAL FLORA Performed at [facility]  Report Status 10/14/2015 FINAL  Final  Culture, Urine     Status: None   Collection Time: 10/13/15 12:49 PM  Result Value Ref Range Status   Specimen Description URINE, RANDOM  Final   Special Requests NONE  Final   Culture 9,000 COLONIES/mL INSIGNIFICANT GROWTH  Final   Report Status 10/14/2015 FINAL  Final  Culture, respiratory (NON-Expectorated)     Status: None (Preliminary result)   Collection Time: 10/16/15  6:15 PM  Result Value Ref Range Status   Specimen Description TRACHEAL  ASPIRATE  Final   Special Requests NONE  Final   Gram Stain PENDING  Incomplete   Culture NO GROWTH Performed at [facility]   Final   Report Status PENDING  Incomplete    Anti-infectives:  Anti-infectives    Start     Dose/Rate Route Frequency Ordered Stop   10/16/15 1900  piperacillin-tazobactam (ZOSYN) IVPB 3.375 g     3.375 g 12.5 mL/hr over 240 Minutes Intravenous Every 8 hours 10/16/15 1810     10/13/15 0800  cefTRIAXone (ROCEPHIN) 2 g in dextrose 5 % 50 mL IVPB    Comments:  Pharmacy may adjust dosing strength / duration / interval for maximal efficacy   2 g 100 mL/hr over 30 Minutes Intravenous Every 24 hours 10/13/15 0757 10/15/15 0915   09/20/15 1700  piperacillin-tazobactam (ZOSYN) IVPB 3.375 g     3.375 g 12.5 mL/hr over 240 Minutes Intravenous Every 8 hours 09/20/15 1540 09/26/15 2117      Best Practice/Protocols:  VTE Prophylaxis: Lovenox (prophylaxtic dose) Intermittent Sedation  Consults:      Studies:CXR - 1. Lines and tubes in stable position. 2. Low lung volumes with mild basilar atelectasis. Slight interim clearing from prior exam . 3. Left rib fractures again noted. No pneumothorax.   Subjective:    Overnight Issues:  none Objective:  Vital signs for last 24 hours: Temp:  [98.1 F (36.7 C)-99.5 F (37.5 C)] 98.5 F (36.9 C) (02/02 0400) Pulse Rate:  [105-125] 115 (02/02 0730) Resp:  [19-43] 32 (02/02 0730) BP: (114-142)/(78-114) 135/87 mmHg (02/02 0730) SpO2:  [96 %-100 %] 100 % (02/02 0730) FiO2 (%):  [28 %-40 %] 28 % (02/02 0730) Weight:  [60.9 kg (134 lb 4.2 oz)] 60.9 kg (134 lb 4.2 oz) (02/02 0353)  Hemodynamic parameters for last 24 hours:    Intake/Output from previous day: 02/01 0701 - 02/02 0700 In: 1730 [I.V.:20; NG/GT:1380; IV Piggyback:100] Out: 1475 [Urine:1475]  Intake/Output this shift:    Vent settings for last 24 hours: Vent Mode:  [-]  FiO2 (%):  [28 %-40 %] 28 %  Physical Exam:  General: on  HTC Neuro: alert, agitated with stimuli, with payy muir on answered name and location, denied pain HEENT/Neck: trach in place with secretions Resp: few rhonchi CVS: RRR GI: soft, NT Extremities: mild edema  Results for orders placed or performed during the hospital encounter of 09/18/15 (from the past 24 hour(s))  Glucose, capillary     Status: Abnormal   Collection Time: 10/16/15 11:16 AM  Result Value Ref Range   Glucose-Capillary 193 (H) 65 - 99 mg/dL   Comment 1 Notify RN    Comment 2 Document in Chart   Glucose, capillary     Status: Abnormal   Collection Time: 10/16/15  4:04 PM  Result Value Ref Range   Glucose-Capillary 144 (H) 65 - 99 mg/dL   Comment 1 Notify RN    Comment 2 Document in Chart  Culture, respiratory (NON-Expectorated)     Status: None (Preliminary result)   Collection Time: 10/16/15  6:15 PM  Result Value Ref Range   Specimen Description TRACHEAL ASPIRATE    Special Requests NONE    Gram Stain PENDING    Culture NO GROWTH Performed at [facility]     Report Status PENDING   Glucose, capillary     Status: Abnormal   Collection Time: 10/16/15  8:06 PM  Result Value Ref Range   Glucose-Capillary 168 (H) 65 - 99 mg/dL  Glucose, capillary     Status: Abnormal   Collection Time: 10/16/15 11:01 PM  Result Value Ref Range   Glucose-Capillary 137 (H) 65 - 99 mg/dL  Glucose, capillary     Status: Abnormal   Collection Time: 10/17/15  3:37 AM  Result Value Ref Range   Glucose-Capillary 154 (H) 65 - 99 mg/dL  CBC with Differential/Platelet     Status: Abnormal   Collection Time: 10/17/15  4:00 AM  Result Value Ref Range   WBC 12.7 (H) 4.0 - 10.5 K/uL   RBC 3.66 (L) 4.22 - 5.81 MIL/uL   Hemoglobin 11.2 (L) 13.0 - 17.0 g/dL   HCT 34.6 (L) 39.0 - 52.0 %   MCV 94.5 78.0 - 100.0 fL   MCH 30.6 26.0 - 34.0 pg   MCHC 32.4 30.0 - 36.0 g/dL   RDW 14.3 11.5 - 15.5 %   Platelets 456 (H) 150 - 400 K/uL   Neutrophils Relative % 71 %   Neutro Abs 8.9 (H)  1.7 - 7.7 K/uL   Lymphocytes Relative 17 %   Lymphs Abs 2.2 0.7 - 4.0 K/uL   Monocytes Relative 9 %   Monocytes Absolute 1.2 (H) 0.1 - 1.0 K/uL   Eosinophils Relative 3 %   Eosinophils Absolute 0.4 0.0 - 0.7 K/uL   Basophils Relative 0 %   Basophils Absolute 0.1 0.0 - 0.1 K/uL  Basic metabolic panel     Status: Abnormal   Collection Time: 10/17/15  4:00 AM  Result Value Ref Range   Sodium 147 (H) 135 - 145 mmol/L   Potassium 3.9 3.5 - 5.1 mmol/L   Chloride 111 101 - 111 mmol/L   CO2 26 22 - 32 mmol/L   Glucose, Bld 162 (H) 65 - 99 mg/dL   BUN 25 (H) 6 - 20 mg/dL   Creatinine, Ser 0.47 (L) 0.61 - 1.24 mg/dL   Calcium 9.9 8.9 - 10.3 mg/dL   GFR calc non Af Amer >60 >60 mL/min   GFR calc Af Amer >60 >60 mL/min   Anion gap 10 5 - 15    Assessment & Plan: Present on Admission:  . Fracture of multiple ribs . Multiple rib fractures   LOS: 29 days   Additional comments:I reviewed the patient's new clinical lab test results. and CXR [facility] Multiple left rib fxs w/pulmonary contusions - L CT out and no PTX Left renal contusion ARF w/ARDS - doing well on HTC 28% ABL anemia - stable Hyperglycemia -  SSI FEN - TF, add free water for hypernatremia CV - tachycardia - lopressor VTE - SCD's, Lovenox Dispo - ICU Critical Care Total Time*: 30 Minutes  Georganna Skeans, MD, MPH, FACS Trauma: [phone] General Surgery: [phone]  10/17/2015  *Care during the described time interval was provided by me. I have reviewed this patient's available data, including medical history, events of note, physical examination and test results as part of my evaluation.
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
## 2015-10-17 NOTE — Progress Notes (Signed)
SLP Cancellation Note  [name] MRN: [id number] DOB: Sep 16, 1950   Cancelled treatment:       Reason Eval/Treat Not Completed: Medical issues which prohibited therapy. Attempted to see pt for PMSV/swallowing treatment, but RR is hovering in the 40s at rest. RN provided tracheal suction for clearance of secretions without change in VS. Pt not appropriate for POs or valve placement at this time - will try again at another time.   [name], M.A. CCC-SLP [phone]  [name] 10/17/2015, 10:03 AM

## 2015-10-18 LAB — BLOOD GAS, ARTERIAL
ACID-BASE EXCESS: 0.4 mmol/L (ref 0.0–2.0)
BICARBONATE: 23.6 meq/L (ref 20.0–24.0)
DRAWN BY: 441371
FIO2: 0.28
O2 Content: 5 L/min
O2 SAT: 96.5 %
PATIENT TEMPERATURE: 99.8
PCO2 ART: 33.6 mmHg — AB (ref 35.0–45.0)
PO2 ART: 94.2 mmHg (ref 80.0–100.0)
TCO2: 24.6 mmol/L (ref 0–100)
pH, Arterial: 7.464 — ABNORMAL HIGH (ref 7.350–7.450)

## 2015-10-18 LAB — GLUCOSE, CAPILLARY
GLUCOSE-CAPILLARY: 109 mg/dL — AB (ref 65–99)
GLUCOSE-CAPILLARY: 151 mg/dL — AB (ref 65–99)
GLUCOSE-CAPILLARY: 159 mg/dL — AB (ref 65–99)
Glucose-Capillary: 152 mg/dL — ABNORMAL HIGH (ref 65–99)
Glucose-Capillary: 154 mg/dL — ABNORMAL HIGH (ref 65–99)
Glucose-Capillary: 123 mg/dL — ABNORMAL HIGH (ref 65–99)

## 2015-10-18 LAB — BASIC METABOLIC PANEL
ANION GAP: 12 (ref 5–15)
BUN: 28 mg/dL — ABNORMAL HIGH (ref 6–20)
CALCIUM: 10.3 mg/dL (ref 8.9–10.3)
CO2: 26 mmol/L (ref 22–32)
Chloride: 110 mmol/L (ref 101–111)
Creatinine, Ser: 0.62 mg/dL (ref 0.61–1.24)
GLUCOSE: 192 mg/dL — AB (ref 65–99)
POTASSIUM: 3.7 mmol/L (ref 3.5–5.1)
SODIUM: 148 mmol/L — AB (ref 135–145)

## 2015-10-18 LAB — CBC
HCT: 36.3 % — ABNORMAL LOW (ref 39.0–52.0)
Hemoglobin: 11.8 g/dL — ABNORMAL LOW (ref 13.0–17.0)
MCH: 30.3 pg (ref 26.0–34.0)
MCHC: 32.5 g/dL (ref 30.0–36.0)
MCV: 93.3 fL (ref 78.0–100.0)
PLATELETS: 448 10*3/uL — AB (ref 150–400)
RBC: 3.89 MIL/uL — AB (ref 4.22–5.81)
RDW: 14.5 % (ref 11.5–15.5)
WBC: 14.9 10*3/uL — AB (ref 4.0–10.5)

## 2015-10-18 MED ORDER — ALTEPLASE 2 MG IJ SOLR
2.0000 mg | Freq: Once | INTRAMUSCULAR | Status: AC
  Administered 2015-10-18: 2 mg
  Filled 2015-10-18: qty 2

## 2015-10-18 MED ORDER — FREE WATER
200.0000 mL | Freq: Four times a day (QID) | Status: DC
  Administered 2015-10-18 – 2015-11-05 (×69): 200 mL
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
## 2015-10-18 NOTE — Progress Notes (Signed)
Regular length #8 Shiley tracheostomy tube changed for #8 Shiley XLT long proximal tracheostomy tube with a significant amount of coughing.  This was in preparation to possibly putting the patient back on the ventilator later today if his respiratory rate does not come down at least into the low 30's.  Will check ABG soon.  Kathryne Eriksson. Dahlia Bailiff, MD, [place] [phone] Trauma Surgeon
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
## 2015-10-18 NOTE — Progress Notes (Addendum)
Spoke with [name] [name], transfer coordinator at [facility] regarding possible transfer to [place] vs transfer to [place].  Mr. [name] [name] states decision has not been reached yet regarding pt's disposition.  Pt is a veteran, but is not service connected, so basically he has no insurance.  [facility] is trying to come up with best and most cost effective plan for patient regarding his discharge, as they have no mechanism for paying for veterans without insurance.    Mr. [name] [name] advised me to call his manager on Monday, 10/21/15 to discuss pt's discharge with her.  Contact info: [name] [name], Nursing Manager; phone: [phone], ext. [phone], or [phone].   Will follow up with Ms. [name] [name] on Monday.    [name], RN, BSN  Trauma/Neuro ICU Case Manager [phone]
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
## 2015-10-18 NOTE — Progress Notes (Signed)
Dr Hulen Skains changed trach from an 56mm Shiley to a 8XLT Shiley.

## 2015-10-19 LAB — CBC WITH DIFFERENTIAL/PLATELET
BASOS ABS: 0.1 10*3/uL (ref 0.0–0.1)
BASOS PCT: 0 %
Eosinophils Absolute: 0.5 10*3/uL (ref 0.0–0.7)
Eosinophils Relative: 4 %
HEMATOCRIT: 34.1 % — AB (ref 39.0–52.0)
Hemoglobin: 11 g/dL — ABNORMAL LOW (ref 13.0–17.0)
LYMPHS PCT: 27 %
Lymphs Abs: 3.4 10*3/uL (ref 0.7–4.0)
MCH: 30.3 pg (ref 26.0–34.0)
MCHC: 32.3 g/dL (ref 30.0–36.0)
MCV: 93.9 fL (ref 78.0–100.0)
Monocytes Absolute: 1.1 10*3/uL — ABNORMAL HIGH (ref 0.1–1.0)
Monocytes Relative: 9 %
NEUTROS ABS: 7.6 10*3/uL (ref 1.7–7.7)
Neutrophils Relative %: 60 %
PLATELETS: 444 10*3/uL — AB (ref 150–400)
RBC: 3.63 MIL/uL — AB (ref 4.22–5.81)
RDW: 14.2 % (ref 11.5–15.5)
WBC: 12.6 10*3/uL — AB (ref 4.0–10.5)

## 2015-10-19 LAB — GLUCOSE, CAPILLARY
GLUCOSE-CAPILLARY: 141 mg/dL — AB (ref 65–99)
GLUCOSE-CAPILLARY: 127 mg/dL — AB (ref 65–99)
GLUCOSE-CAPILLARY: 129 mg/dL — AB (ref 65–99)
Glucose-Capillary: 122 mg/dL — ABNORMAL HIGH (ref 65–99)
Glucose-Capillary: 128 mg/dL — ABNORMAL HIGH (ref 65–99)
Glucose-Capillary: 116 mg/dL — ABNORMAL HIGH (ref 65–99)

## 2015-10-19 LAB — CULTURE, RESPIRATORY W GRAM STAIN: Culture: NORMAL

## 2015-10-19 LAB — CULTURE, RESPIRATORY

## 2015-10-19 MED ORDER — METOPROLOL TARTRATE 25 MG/10 ML ORAL SUSPENSION
50.0000 mg | Freq: Four times a day (QID) | ORAL | Status: DC
  Administered 2015-10-19 – 2015-11-07 (×72): 50 mg
  Filled 2015-10-19 (×86): qty 20

## 2015-10-19 MED ORDER — CLONAZEPAM 1 MG PO TBDP: 1.0000 mg | ORAL_TABLET | Freq: Three times a day (TID) | ORAL | Status: DC

## 2015-10-19 MED ORDER — HYDROCODONE-ACETAMINOPHEN 7.5-325 MG/15ML PO SOLN
10.0000 mL | ORAL | Status: DC | PRN
  Administered 2015-10-19 – 2015-10-20 (×2): 15 mL via ORAL
  Administered 2015-10-21: 20 mL via ORAL
  Administered 2015-10-21: 15 mL via ORAL
  Administered 2015-10-22 (×3): 20 mL via ORAL
  Administered 2015-10-23 – 2015-10-24 (×4): 15 mL via ORAL
  Administered 2015-10-24 – 2015-10-25 (×3): 20 mL via ORAL
  Administered 2015-10-25: 15 mL via ORAL
  Administered 2015-10-25 – 2015-10-27 (×5): 20 mL via ORAL
  Administered 2015-10-27: 15 mL via ORAL
  Administered 2015-10-27 – 2015-10-28 (×6): 20 mL via ORAL
  Administered 2015-10-28 – 2015-10-29 (×3): 15 mL via ORAL
  Administered 2015-10-29 (×3): 20 mL via ORAL
  Administered 2015-10-30: 15 mL via ORAL
  Administered 2015-10-30 – 2015-11-01 (×9): 20 mL via ORAL
  Administered 2015-11-01: 15 mL via ORAL
  Administered 2015-11-01: 20 mL via ORAL
  Administered 2015-11-02 (×2): 15 mL via ORAL
  Administered 2015-11-02: 20 mL via ORAL
  Administered 2015-11-03 – 2015-11-04 (×2): 15 mL via ORAL
  Administered 2015-11-04: 10 mL via ORAL
  Administered 2015-11-04 – 2015-11-06 (×3): 15 mL via ORAL
  Administered 2015-11-06: 10 mL via ORAL
  Administered 2015-11-07: 15 mL via ORAL
  Filled 2015-10-19: qty 15
  Filled 2015-10-19: qty 30
  Filled 2015-10-19: qty 15
  Filled 2015-10-19 (×5): qty 30
  Filled 2015-10-19 (×2): qty 15
  Filled 2015-10-19 (×3): qty 30
  Filled 2015-10-19 (×2): qty 15
  Filled 2015-10-19 (×3): qty 30
  Filled 2015-10-19: qty 15
  Filled 2015-10-19 (×6): qty 30
  Filled 2015-10-19 (×4): qty 15
  Filled 2015-10-19 (×3): qty 30
  Filled 2015-10-19 (×4): qty 15
  Filled 2015-10-19 (×4): qty 30
  Filled 2015-10-19 (×2): qty 15
  Filled 2015-10-19 (×6): qty 30
  Filled 2015-10-19 (×2): qty 15
  Filled 2015-10-19 (×3): qty 30
  Filled 2015-10-19 (×4): qty 15
  Filled 2015-10-19: qty 30

## 2015-10-19 MED ORDER — CLONAZEPAM 1 MG PO TABS
1.0000 mg | ORAL_TABLET | Freq: Three times a day (TID) | ORAL | Status: DC
  Administered 2015-10-19 – 2015-10-20 (×3): 1 mg
  Filled 2015-10-19 (×3): qty 1

## 2015-10-19 MED ORDER — FENTANYL 50 MCG/HR TD PT72: 50.0000 ug | MEDICATED_PATCH | TRANSDERMAL | Status: DC

## 2015-10-19 NOTE — Progress Notes (Signed)
Pt to be transferred to 3S per order. Report called to RN. Pt transferred via bed with personal belongings. Tried calling pts daughter, [name], but no answer. Did not leave message due pt being a XXX.
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
## 2015-10-20 LAB — CBC
HEMATOCRIT: 34.7 % — AB (ref 39.0–52.0)
HEMOGLOBIN: 11 g/dL — AB (ref 13.0–17.0)
MCH: 29.9 pg (ref 26.0–34.0)
MCHC: 31.7 g/dL (ref 30.0–36.0)
MCV: 94.3 fL (ref 78.0–100.0)
Platelets: 403 10*3/uL — ABNORMAL HIGH (ref 150–400)
RBC: 3.68 MIL/uL — ABNORMAL LOW (ref 4.22–5.81)
RDW: 14 % (ref 11.5–15.5)
WBC: 10.7 10*3/uL — AB (ref 4.0–10.5)

## 2015-10-20 LAB — BASIC METABOLIC PANEL
ANION GAP: 11 (ref 5–15)
BUN: 29 mg/dL — ABNORMAL HIGH (ref 6–20)
CALCIUM: 10.3 mg/dL (ref 8.9–10.3)
CHLORIDE: 105 mmol/L (ref 101–111)
CO2: 28 mmol/L (ref 22–32)
Creatinine, Ser: 0.64 mg/dL (ref 0.61–1.24)
GFR calc non Af Amer: >60 mL/min (ref >60)
GLUCOSE: 161 mg/dL — AB (ref 65–99)
Potassium: 4.1 mmol/L (ref 3.5–5.1)
Sodium: 144 mmol/L (ref 135–145)

## 2015-10-20 LAB — GLUCOSE, CAPILLARY
GLUCOSE-CAPILLARY: 102 mg/dL — AB (ref 65–99)
GLUCOSE-CAPILLARY: 120 mg/dL — AB (ref 65–99)
GLUCOSE-CAPILLARY: 129 mg/dL — AB (ref 65–99)
Glucose-Capillary: 130 mg/dL — ABNORMAL HIGH (ref 65–99)
Glucose-Capillary: 137 mg/dL — ABNORMAL HIGH (ref 65–99)
Glucose-Capillary: 152 mg/dL — ABNORMAL HIGH (ref 65–99)

## 2015-10-20 MED ORDER — CLONAZEPAM 1 MG PO TABS
1.0000 mg | ORAL_TABLET | Freq: Two times a day (BID) | ORAL | Status: DC
  Administered 2015-10-20 – 2015-10-24 (×9): 1 mg
  Filled 2015-10-20 (×9): qty 1

## 2015-10-20 MED ORDER — WHITE PETROLATUM GEL
Status: AC
Start: 2015-10-20 — End: 2015-10-20
  Administered 2015-10-20: 12:00:00
  Filled 2015-10-20: qty 1

## 2015-10-20 MED ORDER — QUETIAPINE FUMARATE 50 MG PO TABS
25.0000 mg | ORAL_TABLET | Freq: Every day | ORAL | Status: DC
  Administered 2015-10-20 – 2015-10-23 (×4): 25 mg
  Filled 2015-10-20 (×4): qty 1

## 2015-10-20 MED ORDER — CHLORHEXIDINE GLUCONATE 0.12 % MT SOLN
OROMUCOSAL | Status: AC
  Filled 2015-10-20: qty 15

## 2015-10-20 NOTE — Progress Notes (Signed)
Pt to TX to 6N-28, VSS, called report. Family called & aware of [place].

## 2015-10-20 NOTE — Progress Notes (Signed)
Pt received from [place] at 2200, no s/s of distress noted.
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
## 2015-10-21 LAB — GLUCOSE, CAPILLARY
GLUCOSE-CAPILLARY: 119 mg/dL — AB (ref 65–99)
GLUCOSE-CAPILLARY: 144 mg/dL — AB (ref 65–99)
GLUCOSE-CAPILLARY: 146 mg/dL — AB (ref 65–99)
GLUCOSE-CAPILLARY: 150 mg/dL — AB (ref 65–99)
Glucose-Capillary: 141 mg/dL — ABNORMAL HIGH (ref 65–99)
Glucose-Capillary: 122 mg/dL — ABNORMAL HIGH (ref 65–99)

## 2015-10-21 NOTE — Progress Notes (Signed)
I have left message x 2 today for Nurse Manager Perfecto Kingdom at [facility] (phone [phone], ext [phone])  to discuss decision regarding pt disposition.  Will continue to await callback from [facility] regarding LTAC vs. Transfer to [facility] hospital.  Per Gaspar Bidding with [facility], pt currently still meets LTAC criteria.  Will update daughter, [name].    [name], RN, BSN  Trauma/Neuro ICU Case Manager [phone]

## 2015-10-21 NOTE — Progress Notes (Signed)
No ambu bag in the room noted that I've seen. No am bu bag noted on the unit (clean supply closets). RN made aware of this and will ask secretary to order one for this patient room.
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
## 2015-10-22 ENCOUNTER — Inpatient Hospital Stay (HOSPITAL_COMMUNITY): Payer: Non-veteran care

## 2015-10-22 LAB — GLUCOSE, CAPILLARY
GLUCOSE-CAPILLARY: 113 mg/dL — AB (ref 65–99)
GLUCOSE-CAPILLARY: 152 mg/dL — AB (ref 65–99)
Glucose-Capillary: 122 mg/dL — ABNORMAL HIGH (ref 65–99)
Glucose-Capillary: 111 mg/dL — ABNORMAL HIGH (ref 65–99)
Glucose-Capillary: 135 mg/dL — ABNORMAL HIGH (ref 65–99)
Glucose-Capillary: 141 mg/dL — ABNORMAL HIGH (ref 65–99)
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
## 2015-10-22 NOTE — Progress Notes (Signed)
MBSS complete. Full report located under chart review in imaging section.  Carnell Casamento Paiewonsky, M.A. CCC-SLP [phone]  
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
## 2015-10-22 NOTE — Progress Notes (Signed)
Spoke with Perfecto Kingdom, Nurse Manager at [facility] after multiple attempts:  Ms. Isaiah Blakes states the [employer] will not pay for pt to dc to [facility].  She states as pt is not service connected and has no other insurance, the best option for him at this point is for him to dc to a [place] facility.   She transferred me back to [name], [place], who sent me transfer packet.  Notified MD/PA of [facility] response.   Packet completed, signed by MD and clinicals attached.  Faxed back to [facility] at [phone], attn [name].    Updated pt's daughter, [name], regarding [facility] declining LTAC transfer, and possible transfer to [place].  She states she would like for the two of [place] to discuss this with her dad in the morning.  I will plan to meet with pt and daughter at 9:30 to discuss possible VA transfer.    [name], RN, BSN  Trauma/Neuro ICU Case Manager [phone]

## 2015-10-22 NOTE — Progress Notes (Signed)
Left messages for both [name], [place] transfer coordinator, and Perfecto Kingdom, Nurse Manager at [facility] in attempts to discuss options for disposition.  Will continue to follow.  [name], RN, BSN  Trauma/Neuro ICU Case Manager [phone]
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
## 2015-10-22 NOTE — Progress Notes (Signed)
Ordered Ambu Bag for patient arrived in unit and placed in patient's room.  Ambu Bag provided by RT still inside patient's room.
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
## 2015-10-23 LAB — GLUCOSE, CAPILLARY
GLUCOSE-CAPILLARY: 132 mg/dL — AB (ref 65–99)
GLUCOSE-CAPILLARY: 145 mg/dL — AB (ref 65–99)
GLUCOSE-CAPILLARY: 146 mg/dL — AB (ref 65–99)
GLUCOSE-CAPILLARY: 162 mg/dL — AB (ref 65–99)
Glucose-Capillary: 130 mg/dL — ABNORMAL HIGH (ref 65–99)
Glucose-Capillary: 128 mg/dL — ABNORMAL HIGH (ref 65–99)

## 2015-10-23 MED ORDER — LORAZEPAM 1 MG PO TABS
1.0000 mg | ORAL_TABLET | ORAL | Status: DC | PRN
  Administered 2015-10-23 – 2015-11-03 (×34): 1 mg via ORAL
  Filled 2015-10-23 (×34): qty 1
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
## 2015-10-23 NOTE — Progress Notes (Signed)
Met with pt and daughter, [name] [name], to discuss [facility] transfer.  Nurse put PMV on, and pt was able to verbalize somewhat and ask questions, though at times was unintelligible.  Pt did nod his head yes and mouth yes when asked [facility].  Daughter satisfied that pt is in agreement with transfer.  She signed [facility] transfer form, as he was unable to sign.  Form faxed to L. Stinson, [facility] transfer coordinator.     W. , RN, BSN  Trauma/Neuro ICU Case Manager [phone] 

## 2015-10-23 NOTE — Progress Notes (Signed)
Pt has been accepted for admission at [facility], but no bed available currently.  Per L. Stinson, Scientist, research (life sciences), hopeful for bed either today or tomorrow.  He will notify this case manager when bed available.  Will follow.  Will update daughter, [name].    [name], RN, BSN  Trauma/Neuro ICU Case Manager [phone]

## 2015-10-24 LAB — GLUCOSE, CAPILLARY
GLUCOSE-CAPILLARY: 122 mg/dL — AB (ref 65–99)
GLUCOSE-CAPILLARY: 142 mg/dL — AB (ref 65–99)
GLUCOSE-CAPILLARY: 146 mg/dL — AB (ref 65–99)
Glucose-Capillary: 119 mg/dL — ABNORMAL HIGH (ref 65–99)
Glucose-Capillary: 129 mg/dL — ABNORMAL HIGH (ref 65–99)
Glucose-Capillary: 92 mg/dL (ref 65–99)

## 2015-10-24 MED ORDER — RESOURCE THICKENUP CLEAR PO POWD
ORAL | Status: DC | PRN
  Filled 2015-10-24: qty 125
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
## 2015-10-24 NOTE — Progress Notes (Signed)
Responded to page from patient's Nurse  and Case manger to visit with patient to asses his ability and readiness for completion of an advance directive. After observing patient and speaking with his nurse and case manager again and then with his attending PA. The PA felt it better that we wait until patient was better. I explained to the daughter [name]) at bedside the reason for the delay of  the completion of the document and she was okay with the decision.  I provided spiritual and emotional support to both patient and daughter. I facilitated information sharing between staff and patient daughter.  Will follow as needed.   10/24/15 1000  Clinical Encounter Type  Visited With Patient and family together;Health care provider  Visit Type Initial;Spiritual support;Other (Comment) (Assist with AD)  Referral From Nurse  Spiritual Encounters  Spiritual Needs Literature;Emotional  Advance Directives (For Healthcare)  Does patient have an advance directive? No  Would patient like information on creating an advanced directive? Yes - Educational materials given  Jaclynn Major, [place]
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
## 2015-10-24 NOTE — Procedures (Signed)
Tracheostomy Change Note  Patient Details:   Name: [name] [name] DOB: [birth year] MRN: [id number]    Airway Documentation:     Evaluation  O2 sats: stable throughout Complications: No apparent complications Patient did tolerate procedure well. Bilateral Breath Sounds: Rhonchi Suctioning: Airway   Patient downsized from 8.0 XLT to 6.0 Cuffless [place] at this time. 6.0 cuffless XLT was not available. Tolerated well. Positive ETCO2 color change. RT to monitor as needed Saunders Glance 10/24/2015, 1:00 PM

## 2015-10-25 LAB — GLUCOSE, CAPILLARY
GLUCOSE-CAPILLARY: 116 mg/dL — AB (ref 65–99)
GLUCOSE-CAPILLARY: 135 mg/dL — AB (ref 65–99)
GLUCOSE-CAPILLARY: 96 mg/dL (ref 65–99)
Glucose-Capillary: 125 mg/dL — ABNORMAL HIGH (ref 65–99)
Glucose-Capillary: 143 mg/dL — ABNORMAL HIGH (ref 65–99)
Glucose-Capillary: 128 mg/dL — ABNORMAL HIGH (ref 65–99)

## 2015-10-25 MED ORDER — CLONAZEPAM 0.5 MG PO TABS
0.5000 mg | ORAL_TABLET | Freq: Three times a day (TID) | ORAL | Status: DC
  Administered 2015-10-25 – 2015-11-04 (×33): 0.5 mg
  Filled 2015-10-25 (×33): qty 1
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
## 2015-10-25 NOTE — Progress Notes (Signed)
Pt is very agitated, confused and restless. He been trying to get out of the bed, pulls lines and tubes and been kicking and punching staffs. This RN and other two tech trying to get pt calm. PRN medications were given. Reorient pt, distraction and other non pharmacological measures were utilize but unsuccessful. Called Dr. Kieth Brightly for further instruction. Bilateral soft restraints and safety  were ordered for pt's safety. Notified pt's daughter Branson Hannula and stated that she is coming over to get him calmed down. At 2100, Pt's daughter came and got pt calm down, restraints were removed at 2200. Safety sitter came at 35, daughter left at 2320. Will continue to monitor pt.

## 2015-10-25 NOTE — Progress Notes (Signed)
Faxed progress notes from last 3 days to [facility], per transfer coordinator request.  [name], bed likely available today or tomorrow at [facility].  Will provide updates as available.    [name], RN, BSN  Trauma/Neuro ICU Case Manager [phone]

## 2015-10-25 NOTE — Progress Notes (Signed)
Patient ID: [name] [name], male   DOB: 1951-08-07, 65 y.o.   MRN: [id number]   LOS: 37 days   Subjective: Another bad night last night, could be sundowning. Denies pain this morning. Complaining about his speech I think but he's very hard to understand.   Objective: Vital signs in last 24 hours: Temp:  [98.2 F (36.8 C)-99.1 F (37.3 C)] 98.7 F (37.1 C) (02/10 0503) Pulse Rate:  [98-111] 110 (02/10 0503) Resp:  [18-22] 22 (02/10 0503) BP: (124-140)/(82-89) 135/89 mmHg (02/10 0503) SpO2:  [98 %-100 %] 100 % (02/10 0503) FiO2 (%):  [28 %] 28 % (02/09 2341) Weight:  [64.003 kg (141 lb 1.6 oz)] 64.003 kg (141 lb 1.6 oz) (02/10 0642) Last BM Date: 10/24/15   Laboratory Results CBG (last 3)   Recent Labs  10/24/15 2156 10/24/15 2350 10/25/15 0500  GLUCAP 142* 92 125*    Physical Exam General appearance: no distress Resp: diminished breath sounds LUL Cardio: regular rate and rhythm GI: Soft, +BS, NT   Assessment/Plan: [facility] Multiple left rib fxs w/pulmonary contusions  Left renal contusion ARF w/ARDS - Will plan to downsize to #4 Monday if still doing well ABL anemia - Stable Hyperglycemia - SSI FEN - TF, only ate about 50% of breakfast. Given apparent malnutrition will continue TF for now. Wean scheduled Klonopin. VTE - SCD's, Lovenox Dispo - PT/OT/ST, awaiting transfer to [place] in [facility], [place] Pager: [phone] General Trauma PA Pager: [phone]  10/25/2015

## 2015-10-26 ENCOUNTER — Inpatient Hospital Stay (HOSPITAL_COMMUNITY): Payer: Non-veteran care

## 2015-10-26 LAB — BASIC METABOLIC PANEL
ANION GAP: 11 (ref 5–15)
BUN: 17 mg/dL (ref 6–20)
CALCIUM: 10.1 mg/dL (ref 8.9–10.3)
CHLORIDE: 101 mmol/L (ref 101–111)
CO2: 23 mmol/L (ref 22–32)
Creatinine, Ser: 0.5 mg/dL — ABNORMAL LOW (ref 0.61–1.24)
GFR calc Af Amer: >60 mL/min (ref >60)
GFR calc non Af Amer: >60 mL/min (ref >60)
GLUCOSE: 135 mg/dL — AB (ref 65–99)
Potassium: 4 mmol/L (ref 3.5–5.1)
Sodium: 135 mmol/L (ref 135–145)

## 2015-10-26 LAB — CBC
HEMATOCRIT: 34.4 % — AB (ref 39.0–52.0)
Hemoglobin: 11.3 g/dL — ABNORMAL LOW (ref 13.0–17.0)
MCH: 29.7 pg (ref 26.0–34.0)
MCHC: 32.8 g/dL (ref 30.0–36.0)
MCV: 90.5 fL (ref 78.0–100.0)
Platelets: 357 10*3/uL (ref 150–400)
RBC: 3.8 MIL/uL — ABNORMAL LOW (ref 4.22–5.81)
RDW: 13.9 % (ref 11.5–15.5)
WBC: 15.4 10*3/uL — AB (ref 4.0–10.5)

## 2015-10-26 LAB — GLUCOSE, CAPILLARY
GLUCOSE-CAPILLARY: 127 mg/dL — AB (ref 65–99)
GLUCOSE-CAPILLARY: 126 mg/dL — AB (ref 65–99)
Glucose-Capillary: 117 mg/dL — ABNORMAL HIGH (ref 65–99)
Glucose-Capillary: 155 mg/dL — ABNORMAL HIGH (ref 65–99)
Glucose-Capillary: 105 mg/dL — ABNORMAL HIGH (ref 65–99)

## 2015-10-26 NOTE — Progress Notes (Signed)
Patient ID: Segundo Nevel, male   DOB: May 19, 1951, 65 y.o.   MRN: [id number] Patient ID: Oda Trevizo, male   DOB: September 24, 1950, 65 y.o.   MRN: [id number]   LOS: 38 days   Subjective: Difficult to understand but he answers questions appropriately with head nod   Objective: Vital signs in last 24 hours: Temp:  [98.9 F (37.2 C)-99 F (37.2 C)] 99 F (37.2 C) (02/11 0411) Pulse Rate:  [95-121] 121 (02/11 0852) Resp:  [16-24] 22 (02/11 0852) BP: (120-132)/(74-80) 132/80 mmHg (02/11 0411) SpO2:  [95 %-100 %] 96 % (02/11 0853) FiO2 (%):  [28 %] 28 % (02/11 0853) Last BM Date: 10/24/15   Laboratory Results CBG (last 3)   Recent Labs  10/25/15 2359 10/26/15 0358 10/26/15 0748  GLUCAP 96 117* 127*    Physical Exam General appearance: no distress Resp: wheezes anterior - right Cardio: regular rate and rhythm GI: Soft, +BS, NT   Assessment/Plan: [facility] Multiple left rib fxs w/pulmonary contusions  Left renal contusion ARF w/ARDS - Will plan to downsize to #4 Monday if still doing well ABL anemia - Stable Hyperglycemia - SSI FEN - TF, only ate about 50% of breakfast. Given apparent malnutrition will continue TF for now. Wean scheduled Klonopin. VTE - SCD's, Lovenox Dispo - PT/OT/ST, awaiting transfer to [place] in [address] [facility]   10/26/2015

## 2015-10-27 LAB — GLUCOSE, CAPILLARY
GLUCOSE-CAPILLARY: 117 mg/dL — AB (ref 65–99)
GLUCOSE-CAPILLARY: 138 mg/dL — AB (ref 65–99)
GLUCOSE-CAPILLARY: 128 mg/dL — AB (ref 65–99)
GLUCOSE-CAPILLARY: 117 mg/dL — AB (ref 65–99)
Glucose-Capillary: 127 mg/dL — ABNORMAL HIGH (ref 65–99)
Glucose-Capillary: 136 mg/dL — ABNORMAL HIGH (ref 65–99)

## 2015-10-27 LAB — URINALYSIS, ROUTINE W REFLEX MICROSCOPIC
Bilirubin Urine: NEGATIVE
Glucose, UA: NEGATIVE mg/dL
Hgb urine dipstick: NEGATIVE
Ketones, ur: NEGATIVE mg/dL
LEUKOCYTES UA: NEGATIVE
NITRITE: NEGATIVE
PROTEIN: NEGATIVE mg/dL
Specific Gravity, Urine: 1.023 (ref 1.005–1.030)
pH: 7 (ref 5.0–8.0)

## 2015-10-27 NOTE — Progress Notes (Signed)
18 Days Post-Op  Subjective: Very wild in bed but responds appropriately  Objective: Vital signs in last 24 hours: Temp:  [98.4 F (36.9 C)-98.7 F (37.1 C)] 98.7 F (37.1 C) (02/11 1947) Pulse Rate:  [103-124] 103 (02/11 1947) Resp:  [19-24] 19 (02/11 1947) BP: (124-125)/(66-78) 124/78 mmHg (02/11 1947) SpO2:  [96 %-100 %] 97 % (02/12 0300) FiO2 (%):  [28 %] 28 % (02/12 0300) Last BM Date: 10/24/15  Intake/Output from previous day: 02/11 0701 - 02/12 0700 In: 686 [P.O.:150; NG/GT:536] Out: 1750 [Urine:1750] Intake/Output this shift:    Resp: clear to auscultation bilaterally Cardio: regular rate and rhythm GI: soft, nontender. gtube looks good  Lab Results:   Recent Labs  10/26/15 0505  WBC 15.4*  HGB 11.3*  HCT 34.4*  PLT 357   BMET  Recent Labs  10/26/15 0505  NA 135  K 4.0  CL 101  CO2 23  GLUCOSE 135*  BUN 17  CREATININE 0.50*  CALCIUM 10.1   PT/INR No results for input(s): LABPROT, INR in the last 72 hours. ABG No results for input(s): PHART, HCO3 in the last 72 hours.  Invalid input(s): PCO2, PO2  Studies/Results: Dg Chest Port 1 View  10/26/2015  CLINICAL DATA:  Acute respiratory failure.  COPD. EXAM: PORTABLE CHEST 1 VIEW COMPARISON:  10/17/2015 FINDINGS: Tracheostomy tube is well seated. Normal heart size and mediastinal contours. No acute infiltrate or edema. No effusion or pneumothorax. No acute osseous findings. IMPRESSION: No evidence of active disease. Electronically Signed   By: Monte Fantasia M.D.   On: 10/26/2015 08:59    Anti-infectives: Anti-infectives    Start     Dose/Rate Route Frequency Ordered Stop   10/16/15 1900  piperacillin-tazobactam (ZOSYN) IVPB 3.375 g  Status:  Discontinued     3.375 g 12.5 mL/hr over 240 Minutes Intravenous Every 8 hours 10/16/15 1810 10/21/15 1033   10/13/15 0800  cefTRIAXone (ROCEPHIN) 2 g in dextrose 5 % 50 mL IVPB    Comments:  Pharmacy may adjust dosing strength / duration / interval for  maximal efficacy   2 g 100 mL/hr over 30 Minutes Intravenous Every 24 hours 10/13/15 0757 10/15/15 0915   09/20/15 1700  piperacillin-tazobactam (ZOSYN) IVPB 3.375 g     3.375 g 12.5 mL/hr over 240 Minutes Intravenous Every 8 hours 09/20/15 1540 09/26/15 2117      Assessment/Plan: s/p Procedure(s): PERCUTANEOUS ENDOSCOPIC GASTROSTOMY (PEG) PLACEMENT (N/A) ESOPHAGOGASTRODUODENOSCOPY (EGD) (N/A) Advance diet. Continue tube feeds at goal Restraints cxr ok. Will check urine given rising wbc  LOS: 39 days    TOTH III,Jamale Spangler S 10/27/2015

## 2015-10-27 NOTE — Progress Notes (Signed)
Pt. Appeared to be confused but was comforted with prayer. Prayed with pt. And offered spiritual support.

## 2015-10-27 NOTE — Progress Notes (Signed)
Very difficult patient to care for, not responsive to meds or any other methods to calm down. Does not have any idea of who he is nor where he is or what's going on. Mittens remain intact. Condom cath replace. Trach collar taped in position to keep 02 sats up.

## 2015-10-28 LAB — GLUCOSE, CAPILLARY
GLUCOSE-CAPILLARY: 136 mg/dL — AB (ref 65–99)
Glucose-Capillary: 119 mg/dL — ABNORMAL HIGH (ref 65–99)
Glucose-Capillary: 104 mg/dL — ABNORMAL HIGH (ref 65–99)
Glucose-Capillary: 152 mg/dL — ABNORMAL HIGH (ref 65–99)
Glucose-Capillary: 146 mg/dL — ABNORMAL HIGH (ref 65–99)

## 2015-10-28 NOTE — Progress Notes (Signed)
Notified by [facility] transfer coordinator that pt is first on the list for next acute bed.  Faxed updated clinical information to [name], [facility] transfer coordinator, per request.  Will follow with updates.    [name], RN, BSN  Trauma/Neuro ICU Case Manager [phone]
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
## 2015-10-28 NOTE — Progress Notes (Signed)
[facility] Progress Note  19 Days Post-Op  Subjective: Patient responds appropriately to questions and follows commands. Is able to speak, but hard to understand most of the time. Still trying to get up out of bed and mumbles to himself when attention is not on him. Currently in restraint belt.   Objective: Vital signs in last 24 hours: Temp:  [98.2 F (36.8 C)-98.3 F (36.8 C)] 98.2 F (36.8 C) (02/13 0456) Pulse Rate:  [109-133] 117 (02/13 0456) Resp:  [18-20] 20 (02/13 0456) BP: (113-137)/(88-91) 137/91 mmHg (02/13 0456) SpO2:  [98 %-100 %] 100 % (02/13 0456) FiO2 (%):  [28 %] 28 % (02/13 0456) Weight:  [64 kg (141 lb 1.5 oz)] 64 kg (141 lb 1.5 oz) (02/13 0456) Last BM Date: 10/24/15  Intake/Output from previous day: 02/12 0701 - 02/13 0700 In: 800 [P.O.:80; NG/GT:720] Out: 2250 [Urine:2250] Intake/Output this shift:    PE: Gen:  Alert, NAD, pleasant; agitated Card:  RRR, no M/G/R heard Pulm:  CTA, no W/R/R; Minimal secretions with trach; O2 stat stable Abd: Soft, NT/ND, +BS, no HSM Ext:  No erythema, edema, or tenderness  Lab Results:   Recent Labs  10/26/15 0505  WBC 15.4*  HGB 11.3*  HCT 34.4*  PLT 357   BMET  Recent Labs  10/26/15 0505  NA 135  K 4.0  CL 101  CO2 23  GLUCOSE 135*  BUN 17  CREATININE 0.50*  CALCIUM 10.1   PT/INR No results for input(s): LABPROT, INR in the last 72 hours. CMP     Component Value Date/Time   NA 135 10/26/2015 0505   K 4.0 10/26/2015 0505   CL 101 10/26/2015 0505   CO2 23 10/26/2015 0505   GLUCOSE 135* 10/26/2015 0505   BUN 17 10/26/2015 0505   CREATININE 0.50* 10/26/2015 0505   CALCIUM 10.1 10/26/2015 0505   PROT 5.0* 09/21/2015 0446   ALBUMIN 2.5* 09/21/2015 0446   AST 102* 09/21/2015 0446   ALT 51 09/21/2015 0446   ALKPHOS 82 09/21/2015 0446   BILITOT 1.4* 09/21/2015 0446   GFRNONAA >60 10/26/2015 0505   GFRAA >60 10/26/2015 0505   Lipase     Component Value Date/Time   LIPASE 12  09/20/2015 1958       Studies/Results: Dg Chest Port 1 View  10/26/2015  CLINICAL DATA:  Acute respiratory failure.  COPD. EXAM: PORTABLE CHEST 1 VIEW COMPARISON:  10/17/2015 FINDINGS: Tracheostomy tube is well seated. Normal heart size and mediastinal contours. No acute infiltrate or edema. No effusion or pneumothorax. No acute osseous findings. IMPRESSION: No evidence of active disease. Electronically Signed   By: Monte Fantasia M.D.   On: 10/26/2015 08:59    Anti-infectives: Anti-infectives    Start     Dose/Rate Route Frequency Ordered Stop   10/16/15 1900  piperacillin-tazobactam (ZOSYN) IVPB 3.375 g  Status:  Discontinued     3.375 g 12.5 mL/hr over 240 Minutes Intravenous Every 8 hours 10/16/15 1810 10/21/15 1033   10/13/15 0800  cefTRIAXone (ROCEPHIN) 2 g in dextrose 5 % 50 mL IVPB    Comments:  Pharmacy may adjust dosing strength / duration / interval for maximal efficacy   2 g 100 mL/hr over 30 Minutes Intravenous Every 24 hours 10/13/15 0757 10/15/15 0915   09/20/15 1700  piperacillin-tazobactam (ZOSYN) IVPB 3.375 g     3.375 g 12.5 mL/hr over 240 Minutes Intravenous Every 8 hours 09/20/15 1540 09/26/15 2117       Assessment/Plan  [facility]  Multiple left rib fxs w/pulmonary contusions  Left renal contusion ARF w/ARDS - stable O2 stat and minimal secretions; downsize tube to #4 today ABL anemia - Stable Hyperglycemia - SSI FEN - TF; Given apparent malnutrition will continue TF for now. Wean scheduled Klonopin. VTE - SCD's, Lovenox Dispo - PT/OT/ST, awaiting transfer to [place] in [place]  LOS: 40 days    Lehman Prom 10/28/2015, 8:14 AM Pager: [phone]

## 2015-10-28 NOTE — Progress Notes (Signed)
Left message with [name], [place] transfer coordinator requesting updates on [facility] transfer.  Will follow with updates.    [name], RN, BSN  Trauma/Neuro ICU Case Manager [phone]

## 2015-10-28 NOTE — Progress Notes (Signed)
Trach changed to #4 CFS Shiley per order by this RT and Shea Stakes, RRT, RCP.  Obturator was used and site left intact.  Positive ETCO2 Detected with BIL BS present.  Pt tolerated change well.  No complications noted. VS WNL.

## 2015-10-29 LAB — GLUCOSE, CAPILLARY
GLUCOSE-CAPILLARY: 130 mg/dL — AB (ref 65–99)
GLUCOSE-CAPILLARY: 159 mg/dL — AB (ref 65–99)
Glucose-Capillary: 127 mg/dL — ABNORMAL HIGH (ref 65–99)
Glucose-Capillary: 131 mg/dL — ABNORMAL HIGH (ref 65–99)
Glucose-Capillary: 123 mg/dL — ABNORMAL HIGH (ref 65–99)
Glucose-Capillary: 121 mg/dL — ABNORMAL HIGH (ref 65–99)
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
## 2015-10-29 NOTE — Progress Notes (Signed)
[facility] Progress Note  20 Days Post-Op  Subjective: Patient is alert and pleasant this morning. Responds to questions appropriately. Posey in place. Trach collar in place. Pt complaining of HA this morning but has no other complaints. Once I left the room, the patient began mumbling to himself and rolling side to side in his bed.   Objective: Vital signs in last 24 hours: Temp:  [98.3 F (36.8 C)-98.6 F (37 C)] 98.6 F (37 C) (02/14 0438) Pulse Rate:  [96-120] 110 (02/14 0438) Resp:  [18] 18 (02/14 0438) BP: (120-124)/(74-79) 120/79 mmHg (02/14 0438) SpO2:  [99 %-100 %] 100 % (02/14 0438) FiO2 (%):  [28 %] 28 % (02/14 0438) Weight:  [62.551 kg (137 lb 14.4 oz)] 62.551 kg (137 lb 14.4 oz) (02/14 0438) Last BM Date: 10/24/15  Intake/Output from previous day: 02/13 0701 - 02/14 0700 In: 2480 [P.O.:480; NG/GT:2000] Out: 2350 [Urine:2350] Intake/Output this shift:   PE: Gen:  Alert, NAD, pleasant; VSS Card:  RRR, no M/G/R heard Pulm:  CTABL, no W/R/R; trach collar in place on 28%O2, minimal secretions. SpO2 100% Abd: Soft, NT/ND, +BS, no HSM; PEG tube in place. Ext:  No erythema, edema, or tenderness  CMP     Component Value Date/Time   NA 135 10/26/2015 0505   K 4.0 10/26/2015 0505   CL 101 10/26/2015 0505   CO2 23 10/26/2015 0505   GLUCOSE 135* 10/26/2015 0505   BUN 17 10/26/2015 0505   CREATININE 0.50* 10/26/2015 0505   CALCIUM 10.1 10/26/2015 0505   PROT 5.0* 09/21/2015 0446   ALBUMIN 2.5* 09/21/2015 0446   AST 102* 09/21/2015 0446   ALT 51 09/21/2015 0446   ALKPHOS 82 09/21/2015 0446   BILITOT 1.4* 09/21/2015 0446   GFRNONAA >60 10/26/2015 0505   GFRAA >60 10/26/2015 0505   Anti-infectives: Anti-infectives    Start     Dose/Rate Route Frequency Ordered Stop   10/16/15 1900  piperacillin-tazobactam (ZOSYN) IVPB 3.375 g  Status:  Discontinued     3.375 g 12.5 mL/hr over 240 Minutes Intravenous Every 8 hours 10/16/15 1810 10/21/15 1033   10/13/15 0800  cefTRIAXone (ROCEPHIN) 2 g in dextrose 5 % 50 mL IVPB    Comments:  Pharmacy may adjust dosing strength / duration / interval for maximal efficacy   2 g 100 mL/hr over 30 Minutes Intravenous Every 24 hours 10/13/15 0757 10/15/15 0915   09/20/15 1700  piperacillin-tazobactam (ZOSYN) IVPB 3.375 g     3.375 g 12.5 mL/hr over 240 Minutes Intravenous Every 8 hours 09/20/15 1540 09/26/15 2117      Assessment/Plan [facility] Multiple left rib fxs w/pulmonary contusions  Left renal contusion ARF w/ARDS - stable O2 stat and minimal secretions; downsized tube to #4 yesterday without complication. ABL anemia - Stable Hyperglycemia - SSI FEN - TF; Given apparent malnutrition will continue TF for now. Wean scheduled Klonopin. VTE - SCD's, Lovenox Dispo - PT/OT/ST, awaiting transfer to [place] in [place], patient is first on the list for the next acute bed.    LOS: 41 days    [name] [name]  10/29/2015, 7:25 AM Pager: [phone]
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
## 2015-10-30 LAB — GLUCOSE, CAPILLARY
GLUCOSE-CAPILLARY: 146 mg/dL — AB (ref 65–99)
GLUCOSE-CAPILLARY: 132 mg/dL — AB (ref 65–99)
Glucose-Capillary: 127 mg/dL — ABNORMAL HIGH (ref 65–99)
Glucose-Capillary: 127 mg/dL — ABNORMAL HIGH (ref 65–99)
Glucose-Capillary: 169 mg/dL — ABNORMAL HIGH (ref 65–99)
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
## 2015-10-30 NOTE — Progress Notes (Signed)
[facility] Progress Note  21 Days Post-Op  Subjective: Patient is alert, polite, and speaking clearly this morning. He answered all questions appropriately and pleasantly. Tech was replacing condom cath during exam. Patient complained of headaches and was worried about a "medication for his breathing". He was worried that he wasn't going to be able to get it. I reassured the patient that the facility where he would be transferred would take care of his medications. Patient still had posey belt in place. Trach collar in place. Seems less agitated than the past few days. Less mumbling and very clear appropriate conversation.  Objective: Vital signs in last 24 hours: Temp:  [98.1 F (36.7 C)-98.2 F (36.8 C)] 98.1 F (36.7 C) (02/15 0415) Pulse Rate:  [91-120] 112 (02/15 0415) Resp:  [17-19] 17 (02/15 0415) BP: (120-125)/(77-93) 120/93 mmHg (02/15 0415) SpO2:  [98 %-100 %] 100 % (02/15 0415) FiO2 (%):  [28 %] 28 % (02/15 0415) Weight:  [61.961 kg (136 lb 9.6 oz)] 61.961 kg (136 lb 9.6 oz) (02/15 0500) Last BM Date: 10/24/15  Intake/Output from previous day: 02/14 0701 - 02/15 0700 In: 220 [P.O.:220] Out: 1400 [Urine:1400] Intake/Output this shift:    PE: Gen:  Alert, NAD, pleasant Card:  RRR, no M/G/R heard; tachycardic at 115bpm Pulm:  CTAB, no W/R/R; wheezing heard but most likely due to the minimal secretions in trach; O2 stat 100% on 28% O2 Abd: Soft, NT/ND, +BS, peg tube C/D/I Ext:  No erythema, edema, or tenderness, + peripheral pulses  CMP     Component Value Date/Time   NA 135 10/26/2015 0505   K 4.0 10/26/2015 0505   CL 101 10/26/2015 0505   CO2 23 10/26/2015 0505   GLUCOSE 135* 10/26/2015 0505   BUN 17 10/26/2015 0505   CREATININE 0.50* 10/26/2015 0505   CALCIUM 10.1 10/26/2015 0505   PROT 5.0* 09/21/2015 0446   ALBUMIN 2.5* 09/21/2015 0446   AST 102* 09/21/2015 0446   ALT 51 09/21/2015 0446   ALKPHOS 82 09/21/2015 0446   BILITOT 1.4* 09/21/2015  0446   GFRNONAA >60 10/26/2015 0505   GFRAA >60 10/26/2015 0505   Lipase     Component Value Date/Time   LIPASE 12 09/20/2015 1958    Anti-infectives: Anti-infectives    Start     Dose/Rate Route Frequency Ordered Stop   10/16/15 1900  piperacillin-tazobactam (ZOSYN) IVPB 3.375 g  Status:  Discontinued     3.375 g 12.5 mL/hr over 240 Minutes Intravenous Every 8 hours 10/16/15 1810 10/21/15 1033   10/13/15 0800  cefTRIAXone (ROCEPHIN) 2 g in dextrose 5 % 50 mL IVPB    Comments:  Pharmacy may adjust dosing strength / duration / interval for maximal efficacy   2 g 100 mL/hr over 30 Minutes Intravenous Every 24 hours 10/13/15 0757 10/15/15 0915   09/20/15 1700  piperacillin-tazobactam (ZOSYN) IVPB 3.375 g     3.375 g 12.5 mL/hr over 240 Minutes Intravenous Every 8 hours 09/20/15 1540 09/26/15 2117       Assessment/Plan  [facility] Multiple left rib fxs w/pulmonary contusions  Left renal contusion ARF w/ARDS - stable O2 stat and minimal secretions; trach recently downsized to #4 size without complications . ABL anemia - Stable Hyperglycemia - SSI FEN - TF; Given apparent malnutrition will continue TF for now. Wean scheduled Klonopin. VTE - SCD's, Lovenox Dispo - PT/OT/ST, awaiting transfer to [place] in [place], patient is first on the list for the next acute bed.   LOS: 42 days  Lehman Prom 10/30/2015, 8:33 AM Pager: [phone]

## 2015-10-31 LAB — GLUCOSE, CAPILLARY
GLUCOSE-CAPILLARY: 104 mg/dL — AB (ref 65–99)
GLUCOSE-CAPILLARY: 123 mg/dL — AB (ref 65–99)
GLUCOSE-CAPILLARY: 137 mg/dL — AB (ref 65–99)
GLUCOSE-CAPILLARY: 139 mg/dL — AB (ref 65–99)
Glucose-Capillary: 129 mg/dL — ABNORMAL HIGH (ref 65–99)
Glucose-Capillary: 143 mg/dL — ABNORMAL HIGH (ref 65–99)
Glucose-Capillary: 150 mg/dL — ABNORMAL HIGH (ref 65–99)
Glucose-Capillary: 136 mg/dL — ABNORMAL HIGH (ref 65–99)
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
## 2015-10-31 NOTE — Progress Notes (Signed)
[facility] Progress Note  22 Days Post-Op  Subjective: Patient comfortable in bed on trach collar. Denies any pain or new complaints. Still concerned about a medication he was taking at home that he is not receiving here. Denies palpitations and SOB this AM.   Objective: Vital signs in last 24 hours: Temp:  [97.9 F (36.6 C)-98.6 F (37 C)] 97.9 F (36.6 C) (02/16 0459) Pulse Rate:  [93-109] 93 (02/16 0459) Resp:  [18] 18 (02/16 0459) BP: (113-139)/(65-98) 114/65 mmHg (02/16 0459) SpO2:  [95 %-100 %] 100 % (02/16 0459) FiO2 (%):  [28 %] 28 % (02/16 0459) Weight:  [60.328 kg (133 lb)] 60.328 kg (133 lb) (02/16 0500) Last BM Date: 10/24/15  Intake/Output from previous day: 02/15 0701 - 02/16 0700 In: 1013 [NG/GT:1013] Out: 1700 [Urine:1700] Intake/Output this shift: Total I/O In: 492 [NG/GT:492] Out: -   PE: Gen: Alert, NAD, pleasant; VSS Card: RRR, no M/G/R heard, pulse down to 93 bpm from over 110 yesterday. Pulm: CTABL, no W/R/R; trach collar in place on 28%O2, minimal secretions. SpO2 100% Abd: Soft, NT/ND, +BS, no HSM; PEG tube in place. Ext: No erythema, edema, or tenderness  Lab Results:  CMP     Component Value Date/Time   NA 135 10/26/2015 0505   K 4.0 10/26/2015 0505   CL 101 10/26/2015 0505   CO2 23 10/26/2015 0505   GLUCOSE 135* 10/26/2015 0505   BUN 17 10/26/2015 0505   CREATININE 0.50* 10/26/2015 0505   CALCIUM 10.1 10/26/2015 0505   PROT 5.0* 09/21/2015 0446   ALBUMIN 2.5* 09/21/2015 0446   AST 102* 09/21/2015 0446   ALT 51 09/21/2015 0446   ALKPHOS 82 09/21/2015 0446   BILITOT 1.4* 09/21/2015 0446   GFRNONAA >60 10/26/2015 0505   GFRAA >60 10/26/2015 0505   Anti-infectives: Anti-infectives    Start     Dose/Rate Route Frequency Ordered Stop   10/16/15 1900  piperacillin-tazobactam (ZOSYN) IVPB 3.375 g  Status:  Discontinued     3.375 g 12.5 mL/hr over 240 Minutes Intravenous Every 8 hours 10/16/15 1810 10/21/15 1033   10/13/15 0800  cefTRIAXone (ROCEPHIN) 2 g in dextrose 5 % 50 mL IVPB    Comments:  Pharmacy may adjust dosing strength / duration / interval for maximal efficacy   2 g 100 mL/hr over 30 Minutes Intravenous Every 24 hours 10/13/15 0757 10/15/15 0915   09/20/15 1700  piperacillin-tazobactam (ZOSYN) IVPB 3.375 g     3.375 g 12.5 mL/hr over 240 Minutes Intravenous Every 8 hours 09/20/15 1540 09/26/15 2117     Assessment/Plan [facility] Multiple left rib fxs w/pulmonary contusions  Left renal contusion ARF w/ARDS - stable O2 stat and minimal secretions; trach recently downsized to #4 size without complications. ABL anemia - Stable Hyperglycemia - SSI FEN - TF; Given apparent malnutrition will continue TF for now. Wean scheduled Klonopin. VTE - SCD's, Lovenox Dispo - PT/OT/ST, awaiting transfer to [place] in [place], patient is first on the list for the next acute bed.    LOS: 43 days    Mila Palmer 10/31/2015, 7:39 AM Pager: [phone]
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
## 2015-11-01 ENCOUNTER — Inpatient Hospital Stay (HOSPITAL_COMMUNITY): Payer: Non-veteran care

## 2015-11-01 LAB — GLUCOSE, CAPILLARY
GLUCOSE-CAPILLARY: 141 mg/dL — AB (ref 65–99)
GLUCOSE-CAPILLARY: 130 mg/dL — AB (ref 65–99)
GLUCOSE-CAPILLARY: 139 mg/dL — AB (ref 65–99)
Glucose-Capillary: 108 mg/dL — ABNORMAL HIGH (ref 65–99)
Glucose-Capillary: 139 mg/dL — ABNORMAL HIGH (ref 65–99)

## 2015-11-01 MED ORDER — DIPHENHYDRAMINE HCL 12.5 MG/5ML PO ELIX
50.0000 mg | ORAL_SOLUTION | Freq: Four times a day (QID) | ORAL | Status: DC | PRN
  Administered 2015-11-01 – 2015-11-02 (×2): 50 mg via ORAL
  Filled 2015-11-01 (×2): qty 20

## 2015-11-01 NOTE — Progress Notes (Signed)
MBSS complete. Full report located under chart review in imaging section.  Pt is making good progress in therapy and continues to await placement at [facility]. Given cognitive improvements and increased participation in therapy, could he be considered for CIR?   [name], M.A. CCC-SLP [phone]
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
## 2015-11-01 NOTE — Progress Notes (Signed)
[facility] Progress Note  23 Days Post-Op  Subjective: Patient was upset this morning about his situation. States he is stressed out about what is going on. States his SOB from yesterday has resolved. He denies anymore SOB or palpitations.  Objective: Vital signs in last 24 hours: Temp:  [98.1 F (36.7 C)-98.2 F (36.8 C)] 98.1 F (36.7 C) (02/17 0403) Pulse Rate:  [100-111] 100 (02/17 0403) Resp:  [16-20] 19 (02/17 0403) BP: (95-128)/(66-78) 128/78 mmHg (02/17 0403) SpO2:  [98 %-100 %] 99 % (02/17 0756) FiO2 (%):  [28 %] 28 % (02/17 0756) Weight:  [60.555 kg (133 lb 8 oz)] 60.555 kg (133 lb 8 oz) (02/17 0500) Last BM Date: 10/31/15  Intake/Output from previous day: 02/16 0701 - 02/17 0700 In: [id number] [P.O.:592; I.V.:1274660.3; NG/GT:1817; IV Piggyback:3896066.7] Out: 225 [Urine:225] Intake/Output this shift:    PE: Gen:  Alert, NAD, pleasant; calm but anxious Card:  RRR, no M/G/R heard; HR 108 this morning Pulm:  CTAB, no W/R/R; Trach on 28%, minimal secretions, SPO2 100% Abd: Soft, NT/ND, +BS, no HSM, Peg Tube site C/D/I Ext:  No erythema, edema, or tenderness  Lab Results:  No results for input(s): WBC, HGB, HCT, PLT in the last 72 hours. BMET No results for input(s): NA, K, CL, CO2, GLUCOSE, BUN, CREATININE, CALCIUM in the last 72 hours. PT/INR No results for input(s): LABPROT, INR in the last 72 hours. CMP     Component Value Date/Time   NA 135 10/26/2015 0505   K 4.0 10/26/2015 0505   CL 101 10/26/2015 0505   CO2 23 10/26/2015 0505   GLUCOSE 135* 10/26/2015 0505   BUN 17 10/26/2015 0505   CREATININE 0.50* 10/26/2015 0505   CALCIUM 10.1 10/26/2015 0505   PROT 5.0* 09/21/2015 0446   ALBUMIN 2.5* 09/21/2015 0446   AST 102* 09/21/2015 0446   ALT 51 09/21/2015 0446   ALKPHOS 82 09/21/2015 0446   BILITOT 1.4* 09/21/2015 0446   GFRNONAA >60 10/26/2015 0505   GFRAA >60 10/26/2015 0505   Lipase     Component Value Date/Time   LIPASE 12  09/20/2015 1958    Studies/Results: No results found.  Anti-infectives: Anti-infectives    Start     Dose/Rate Route Frequency Ordered Stop   10/16/15 1900  piperacillin-tazobactam (ZOSYN) IVPB 3.375 g  Status:  Discontinued     3.375 g 12.5 mL/hr over 240 Minutes Intravenous Every 8 hours 10/16/15 1810 10/21/15 1033   10/13/15 0800  cefTRIAXone (ROCEPHIN) 2 g in dextrose 5 % 50 mL IVPB    Comments:  Pharmacy may adjust dosing strength / duration / interval for maximal efficacy   2 g 100 mL/hr over 30 Minutes Intravenous Every 24 hours 10/13/15 0757 10/15/15 0915   09/20/15 1700  piperacillin-tazobactam (ZOSYN) IVPB 3.375 g     3.375 g 12.5 mL/hr over 240 Minutes Intravenous Every 8 hours 09/20/15 1540 09/26/15 2117       Assessment/Plan  [facility] Multiple left rib fxs w/pulmonary contusions  Left renal contusion ARF w/ARDS - stable O2 stat and minimal secretions; trach recently downsized to #4 size without complications. ABL anemia - Stable Hyperglycemia - SSI FEN - TF; Given apparent malnutrition will continue TF for now. Continue scheduled Klonopin. VTE - SCD's, Lovenox Dispo - PT/OT/ST, awaiting transfer to [place] in [place], patient is first on the list for the next acute bed.    LOS: 44 days    Lehman Prom, PA-SII 11/01/2015, 7:57 AM Pager: [phone]
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
## 2015-11-02 LAB — GLUCOSE, CAPILLARY
GLUCOSE-CAPILLARY: 125 mg/dL — AB (ref 65–99)
GLUCOSE-CAPILLARY: 164 mg/dL — AB (ref 65–99)
GLUCOSE-CAPILLARY: 120 mg/dL — AB (ref 65–99)
Glucose-Capillary: 133 mg/dL — ABNORMAL HIGH (ref 65–99)
Glucose-Capillary: 137 mg/dL — ABNORMAL HIGH (ref 65–99)
Glucose-Capillary: 164 mg/dL — ABNORMAL HIGH (ref 65–99)
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
## 2015-11-03 LAB — GLUCOSE, CAPILLARY
GLUCOSE-CAPILLARY: 157 mg/dL — AB (ref 65–99)
GLUCOSE-CAPILLARY: 120 mg/dL — AB (ref 65–99)
GLUCOSE-CAPILLARY: 141 mg/dL — AB (ref 65–99)
Glucose-Capillary: 164 mg/dL — ABNORMAL HIGH (ref 65–99)
Glucose-Capillary: 180 mg/dL — ABNORMAL HIGH (ref 65–99)
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
## 2015-11-03 NOTE — Progress Notes (Signed)
Pt decannulated per order from Dr. Hulen Skains.  Pt tolerated well.  Minimal to no secretions were noted prior to decannulation.  Pt tolerated well and cannula came out with ease.  Pt on room air and tolerating well.

## 2015-11-04 LAB — GLUCOSE, CAPILLARY
GLUCOSE-CAPILLARY: 151 mg/dL — AB (ref 65–99)
Glucose-Capillary: 113 mg/dL — ABNORMAL HIGH (ref 65–99)
Glucose-Capillary: 129 mg/dL — ABNORMAL HIGH (ref 65–99)
Glucose-Capillary: 130 mg/dL — ABNORMAL HIGH (ref 65–99)
Glucose-Capillary: 141 mg/dL — ABNORMAL HIGH (ref 65–99)

## 2015-11-04 NOTE — Progress Notes (Signed)
Updated Hydrographic surveyor and bed control at [facility] on patient status, and that he is no longer in restraints.  [facility] has "no restraint" policy, and would provide pt a sitter upon admission, if needed.  Bed coordinator states hospital currently holding patients in ED, but she will update me later this afternoon as to possibility of bed availability.    Will follow as updates available.    [name], RN, BSN  Trauma/Neuro ICU Case Manager [phone]
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
## 2015-11-04 NOTE — Progress Notes (Signed)
26 Days Post-Op  Subjective: Some pain. No n/v. States he is eating about 1/2 food on tray. decannulated yesterday Still getting continuous TF Objective: Vital signs in last 24 hours: Temp:  [98.4 F (36.9 C)-98.7 F (37.1 C)] 98.4 F (36.9 C) (02/19 2108) Pulse Rate:  [92-114] 94 (02/19 2108) Resp:  [14-20] 18 (02/19 2108) BP: (129-132)/(72-95) 132/72 mmHg (02/19 2108) SpO2:  [99 %-100 %] 100 % (02/19 2108) Weight:  [60.4 kg (133 lb 2.5 oz)] 60.4 kg (133 lb 2.5 oz) (02/20 0643) Last BM Date: 11/03/15  Intake/Output from previous day: 02/19 0701 - 02/20 0700 In: 360 [P.O.:360] Out: 1900 [Urine:1900] Intake/Output this shift: Total I/O In: 240 [P.O.:240] Out: 500 [Urine:500]  Alert, nad, nontoxic cta b/l Reg Soft, nt, nd, g tube intact.  No edema  Lab Results:  No results for input(s): WBC, HGB, HCT, PLT in the last 72 hours. BMET No results for input(s): NA, K, CL, CO2, GLUCOSE, BUN, CREATININE, CALCIUM in the last 72 hours. PT/INR No results for input(s): LABPROT, INR in the last 72 hours. ABG No results for input(s): PHART, HCO3 in the last 72 hours.  Invalid input(s): PCO2, PO2  Studies/Results: No results found.  Anti-infectives: Anti-infectives    Start     Dose/Rate Route Frequency Ordered Stop   10/16/15 1900  piperacillin-tazobactam (ZOSYN) IVPB 3.375 g  Status:  Discontinued     3.375 g 12.5 mL/hr over 240 Minutes Intravenous Every 8 hours 10/16/15 1810 10/21/15 1033   10/13/15 0800  cefTRIAXone (ROCEPHIN) 2 g in dextrose 5 % 50 mL IVPB    Comments:  Pharmacy may adjust dosing strength / duration / interval for maximal efficacy   2 g 100 mL/hr over 30 Minutes Intravenous Every 24 hours 10/13/15 0757 10/15/15 0915   09/20/15 1700  piperacillin-tazobactam (ZOSYN) IVPB 3.375 g     3.375 g 12.5 mL/hr over 240 Minutes Intravenous Every 8 hours 09/20/15 1540 09/26/15 2117      Assessment/Plan: s/p Procedure(s): PERCUTANEOUS ENDOSCOPIC GASTROSTOMY  (PEG) PLACEMENT (N/A) ESOPHAGOGASTRODUODENOSCOPY (EGD) (N/A) [facility] Multiple left rib fxs w/pulmonary contusions  Left renal contusion ARF w/ARDS - stable O2 stat and minimal secretions;cont pulm toilet; decannulated 2/19 Hyperglycemia - SSI FEN - TF; Given apparent malnutrition will continue TF for now. Check prealbumin, cmet in am. start calorie counts. Will try to transition to cyclic TF over next day or so. Continue scheduled Klonopin. VTE - SCD's, Lovenox Dispo - PT/OT/ST, awaiting transfer to [place] in [place] (still first on list)  Leighton Ruff. Redmond Pulling, MD, FACS General, Bariatric, & Minimally Invasive Surgery [facility], [place]   LOS: 47 days    Gayland Curry 11/04/2015

## 2015-11-05 DIAGNOSIS — E46 Unspecified protein-calorie malnutrition: Secondary | ICD-10-CM | POA: Diagnosis present

## 2015-11-05 DIAGNOSIS — D62 Acute posthemorrhagic anemia: Secondary | ICD-10-CM | POA: Diagnosis not present

## 2015-11-05 DIAGNOSIS — S37012A Minor contusion of left kidney, initial encounter: Secondary | ICD-10-CM | POA: Diagnosis present

## 2015-11-05 HISTORY — DX: Rider (driver) (passenger) of other motorcycle injured in unspecified traffic accident, initial encounter: V29.99XA

## 2015-11-05 LAB — COMPREHENSIVE METABOLIC PANEL
ALK PHOS: 87 U/L (ref 38–126)
ALT: 18 U/L (ref 17–63)
ANION GAP: 8 (ref 5–15)
AST: 16 U/L (ref 15–41)
Albumin: 2.9 g/dL — ABNORMAL LOW (ref 3.5–5.0)
BILIRUBIN TOTAL: 0.2 mg/dL — AB (ref 0.3–1.2)
BUN: 16 mg/dL (ref 6–20)
CALCIUM: 10.2 mg/dL (ref 8.9–10.3)
CO2: 26 mmol/L (ref 22–32)
Chloride: 100 mmol/L — ABNORMAL LOW (ref 101–111)
Creatinine, Ser: 0.54 mg/dL — ABNORMAL LOW (ref 0.61–1.24)
Glucose, Bld: 123 mg/dL — ABNORMAL HIGH (ref 65–99)
POTASSIUM: 4 mmol/L (ref 3.5–5.1)
Sodium: 134 mmol/L — ABNORMAL LOW (ref 135–145)
TOTAL PROTEIN: 7.8 g/dL (ref 6.5–8.1)

## 2015-11-05 LAB — GLUCOSE, CAPILLARY
GLUCOSE-CAPILLARY: 165 mg/dL — AB (ref 65–99)
GLUCOSE-CAPILLARY: 129 mg/dL — AB (ref 65–99)
Glucose-Capillary: 131 mg/dL — ABNORMAL HIGH (ref 65–99)
Glucose-Capillary: 118 mg/dL — ABNORMAL HIGH (ref 65–99)
Glucose-Capillary: 124 mg/dL — ABNORMAL HIGH (ref 65–99)

## 2015-11-05 LAB — PREALBUMIN: PREALBUMIN: 36.3 mg/dL (ref 18–38)

## 2015-11-05 MED ORDER — ACETAMINOPHEN 325 MG PO TABS
650.0000 mg | ORAL_TABLET | Freq: Four times a day (QID) | ORAL | Status: DC | PRN
  Administered 2015-11-05: 650 mg via ORAL
  Filled 2015-11-05: qty 2

## 2015-11-05 MED ORDER — PANTOPRAZOLE SODIUM 40 MG PO PACK
40.0000 mg | PACK | Freq: Every day | ORAL | Status: DC
  Administered 2015-11-05 – 2015-11-07 (×3): 40 mg via ORAL
  Filled 2015-11-05 (×3): qty 20

## 2015-11-05 MED ORDER — CLONAZEPAM 0.5 MG PO TABS
0.5000 mg | ORAL_TABLET | Freq: Two times a day (BID) | ORAL | Status: DC
  Administered 2015-11-05 – 2015-11-07 (×5): 0.5 mg via ORAL
  Filled 2015-11-05 (×5): qty 1

## 2015-11-05 MED ORDER — ENSURE ENLIVE PO LIQD
237.0000 mL | Freq: Three times a day (TID) | ORAL | Status: DC
  Administered 2015-11-05 – 2015-11-07 (×5): 237 mL via ORAL
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
## 2015-11-05 NOTE — Progress Notes (Signed)
Patient states that he is feeling short of breath and asked for a breathing treatment.  TXT  Given.

## 2015-11-05 NOTE — Progress Notes (Signed)
Chaplain provided a spiritual care visit with the patient. He was very emotional and dialogued about life decisions he has made that he felt like have not been positive. Chaplain inquired about family support, faith community, and his overall support system he has.  Chaplain encouraged him to connect more with his family for support for visits to help his emotional health. Chaplain will provide a bible for the patient and provide routine visits with him for spiritual support. A prayer of healing was offered. [name] [name]     [phone]
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
## 2015-11-05 NOTE — Clinical Social Work Note (Signed)
Clinical Social Worker received notification that patient would require SNF placement at discharge now that he is decannulated.  CSW was going to pursue [facility] SNF placement, however RNCM was able to get in touch with patient lady friend and daughter to confirm that patient can return home with home health at discharge.  Clinical Social Worker will sign off for now as social work intervention is no longer needed. Please consult [place] again if new need arises.  Barbette Or, [place]

## 2015-11-05 NOTE — Progress Notes (Signed)
Patient ID: [name] [name], male   DOB: 1950-12-01, 65 y.o.   MRN: [id number]   LOS: 48 days   Subjective: No c/o, feels good.   Objective: Vital signs in last 24 hours: Temp:  [98 F (36.7 C)-98.3 F (36.8 C)] 98.3 F (36.8 C) (02/21 0546) Pulse Rate:  [74-109] 99 (02/21 0546) Resp:  [16-20] 20 (02/21 0546) BP: (119-157)/(75-111) 119/78 mmHg (02/21 0546) SpO2:  [97 %-100 %] 99 % (02/21 0546) Weight:  [61.417 kg (135 lb 6.4 oz)] 61.417 kg (135 lb 6.4 oz) (02/21 0117) Last BM Date: 11/04/15   Laboratory Results CBG (last 3)   Recent Labs  11/04/15 2019 11/04/15 2327 11/05/15 0326  GLUCAP 113* 165* 129*    Physical Exam General appearance: alert and no distress Resp: clear to auscultation bilaterally Cardio: regular rate and rhythm GI: normal findings: bowel sounds normal and soft, non-tender   Assessment/Plan: [facility] Multiple left rib fxs w/pulmonary contusions  Left renal contusion ARF w/ARDS - stable O2 stat and minimal secretions;cont pulm toilet; decannulated 2/19 Hyperglycemia - Has been fine, will d/c FEN - TF; Given apparent malnutrition will continue TF for now. Calorie count underway. Will try to transition to cyclic TF over next day or so. Wean Klonopin. D/C free water. VTE - SCD's, Lovenox Dispo - PT/OT/ST, awaiting transfer to [place] in [place] (still first on list)    Lisette Abu, PA-C Pager: [phone] General Trauma PA Pager: [phone]  11/05/2015

## 2015-11-05 NOTE — Care Management Note (Addendum)
Case Management Note  Patient Details  Name: [name] [name] MRN: [id number] Date of Birth: [birth year]  Subjective/Objective:      Pt has made significant progress over last 3-4 days, and is now decannulated.   Tube feedings were stopped today, and pt states he is eating well.  Ambulating well with RW, and may be able advance to cane, per PT notes.   He no longer requires acute [facility] at [facility].   [facility]ified [facility] that [facility] no longer [facility].       Action/Plan: Met with pt to discuss dc plans.   He does NOT want to go to a nursing home.  Pt states he has a "lady friend" of 35 years that he will dc home with at discharge, and she can assist him at dc, and provide 24h care.  I spoke to his friend by phone; [name] is agreeable to pt coming to her home at dc, and states she can provide 24h care.  Bertha's address is [address], [place], [place] [address].  Best phone # is [phone].   Patient's daughter [name] is pleased with pt's progress, and his plan to dc home with [name].    Expected Discharge Date:                  Expected Discharge Plan:  [place]  In-House Referral:  Clinical Social Work  Discharge planning Services  CM Consult  Post Acute Care Choice:    Choice offered to:     DME Arranged:    DME Agency:     HH Arranged:    [place] Agency:     Status of Service:  In process, will continue to follow  Medicare Important Message Given:    Date Medicare IM Given:    Medicare IM give by:    Date Additional Medicare IM Given:    Additional Medicare Important Message give by:     If discussed at [place] of Stay Meetings, dates discussed:    Additional Comments:  Would recommend HHRN, PT,OT, BSC and RW at dc...once orders in will facilitate approval of HH[facility]DME with [facility].  Must have approval through pt's PCP, Dr. Geni Bers [name].  Will notify community care facilitators [place] at [phone], ext [phone] to arrange  services.    [name], RN, BSN  Trauma/Neuro ICU Case Manager [phone]

## 2015-11-05 NOTE — Progress Notes (Signed)
Calorie Count Note  48 hour calorie count ordered and completed.  Diet: Dysphagia 2 with thin liquids Supplements: n/a  Spoke with RN. She reveals that TF has been d/c. Pt continues to tolerate diet well.   Discussed case with RNCM. Pt discharge plan will now be to go home with family, likely later this week.   Day 1 Breakfast: 364 kcals, 10 grams protein Lunch: 443 kcals, 21 grams protein Dinner: 460 kcals, 20 grams protein Supplements: n/a  Total intake: 1267 kcal (75% of minimum estimated needs)  51 protein (64% of minimum estimated needs)  Day 2 Breakfast: 319 kcals, 7 grams protein Lunch: 0% meal completion Dinner: 410 kcals, 17 grams protein Supplements: n/a  Total intake: 729 kcal (43% of minimum estimated needs)  24 protein (30% of minimum estimated needs)  Average Total intake: 998 kcal (59% of minimum estimated needs)  38 protein (48% of minimum estimated needs)  Nutrition Dx: Inadequate oral intake related to dysphagia as evidenced by  (PEG dependent); progressing  Goal: Patient will meet greater than or equal to 90% of their needs; unmet  Pt currently not meeting needs via PO's alone and TF was stopped today. RD will add supplements to assist pt in meeting nutritional goals.   Intervention:   Ensure Enlive po TID, each supplement provides 350 kcal and 20 grams of protein  Emanuell Morina A. Jimmye Norman, RD, LDN, CDE Pager: [phone] After hours Pager: [phone]
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
## 2015-11-06 MED ORDER — WHITE PETROLATUM GEL
Status: AC
  Administered 2015-11-06: 0.2
  Filled 2015-11-06: qty 1
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
## 2015-11-06 NOTE — Progress Notes (Signed)
Faxed Home health and DME orders to [facility], attn Dr. Meyer Russel (pt's PCP) for approval.  Spoke with Lannette Donath, [place] with [facility] to discuss [facility]'s transition to home.  Priscilla states pt's PCP will have to approve home care and DME orders.  Case manager okay to go ahead and refer to [facility] agency for set up.  Referral made to [facility] with [facility] for [facility] follow up.   [facility] contact information given to [facility] representative, who will follow up with her for authorization.    [name], RN, BSN  Trauma/Neuro ICU Case Manager [phone]
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
## 2015-11-06 NOTE — Progress Notes (Signed)
Pt is stable at this time just finished up Neb.

## 2015-11-07 ENCOUNTER — Encounter (HOSPITAL_COMMUNITY): Payer: Self-pay | Admitting: Emergency Medicine

## 2015-11-07 DIAGNOSIS — S272XXA Traumatic hemopneumothorax, initial encounter: Secondary | ICD-10-CM | POA: Diagnosis present

## 2015-11-07 DIAGNOSIS — S32302A Unspecified fracture of left ilium, initial encounter for closed fracture: Secondary | ICD-10-CM | POA: Diagnosis present

## 2015-11-07 MED ORDER — HYDROCODONE-ACETAMINOPHEN 7.5-325 MG/15ML PO SOLN: 10.0000 mL | ORAL | Status: DC | PRN

## 2015-11-07 MED ORDER — ENSURE ENLIVE PO LIQD: 237.0000 mL | Freq: Three times a day (TID) | ORAL | Status: DC

## 2015-11-07 NOTE — Care Management Note (Signed)
Case Management Note  Patient Details  Name: [name] [name] MRN: [id number] Date of Birth: [birth year]  Subjective/Objective:   Pt medically stable for dc today.   Awaiting authorization from [facility] for [facility] and DME follow up.  Have faxed orders and clinical information to PCP.  Pt's [facility] CSW, [place] through [place] to follow up for auth today.  (Phone # [phone]).              Action/Plan: Met with pt and daughter to finalize dc plans.  They are aware that DME will be overnighted to them from [place] to pt's home.  Pt states he will use a cane until he gets his walker.    Expected Discharge Date:    11-07-2015              Expected Discharge Plan:  [place]  In-House Referral:  Clinical Social Work  Discharge planning Services  CM Consult  Post Acute Care Choice:  Home Health Choice offered to:  [place] / Guardian  DME Arranged:  3-N-1, Walker rolling DME Agency:  [place] Arranged:  RN, PT, OT HH Agency:     Status of Service:  Completed, signed off  Medicare Important Message Given:    Date Medicare IM Given:    Medicare IM give by:    Date Additional Medicare IM Given:    Additional Medicare Important Message give by:     If discussed at [place] of Stay Meetings, dates discussed:    Additional Comments: 1400 Faxed dc summary to Kari Baars at [facility], fax # [phone], per request.   Ella Bodo, RN 11/07/2015, 3:51 PM

## 2015-11-07 NOTE — Discharge Instructions (Signed)
Wash wounds daily in shower with soap and water. °Do not soak. °Apply antibiotic ointment (e.g. Neosporin) twice daily and as needed to keep moist. °Cover with dry dressing. ° °No driving while taking hydrocodone. °
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
## 2015-11-12 ENCOUNTER — Emergency Department (HOSPITAL_COMMUNITY): Payer: Non-veteran care

## 2015-11-12 ENCOUNTER — Encounter (HOSPITAL_COMMUNITY): Payer: Self-pay

## 2015-11-12 ENCOUNTER — Emergency Department (HOSPITAL_COMMUNITY)
Admission: EM | Admit: 2015-11-12 | Discharge: 2015-11-12 | Disposition: A | Discharge status: Home / Self Care | Payer: Non-veteran care | Attending: Emergency Medicine | Admitting: Emergency Medicine

## 2015-11-12 DIAGNOSIS — J441 Chronic obstructive pulmonary disease with (acute) exacerbation: Secondary | ICD-10-CM | POA: Diagnosis not present

## 2015-11-12 DIAGNOSIS — Z9889 Other specified postprocedural states: Secondary | ICD-10-CM | POA: Insufficient documentation

## 2015-11-12 DIAGNOSIS — Z79899 Other long term (current) drug therapy: Secondary | ICD-10-CM | POA: Insufficient documentation

## 2015-11-12 DIAGNOSIS — G4700 Insomnia, unspecified: Secondary | ICD-10-CM | POA: Diagnosis not present

## 2015-11-12 DIAGNOSIS — M25512 Pain in left shoulder: Secondary | ICD-10-CM | POA: Insufficient documentation

## 2015-11-12 DIAGNOSIS — Z7952 Long term (current) use of systemic steroids: Secondary | ICD-10-CM | POA: Insufficient documentation

## 2015-11-12 DIAGNOSIS — I10 Essential (primary) hypertension: Secondary | ICD-10-CM | POA: Diagnosis not present

## 2015-11-12 DIAGNOSIS — R000 Tachycardia, unspecified: Secondary | ICD-10-CM | POA: Insufficient documentation

## 2015-11-12 DIAGNOSIS — Z87891 Personal history of nicotine dependence: Secondary | ICD-10-CM | POA: Diagnosis not present

## 2015-11-12 DIAGNOSIS — R5383 Other fatigue: Secondary | ICD-10-CM | POA: Insufficient documentation

## 2015-11-12 DIAGNOSIS — R001 Bradycardia, unspecified: Secondary | ICD-10-CM | POA: Diagnosis not present

## 2015-11-12 MED ORDER — TRAZODONE HCL 50 MG PO TABS: 50.0000 mg | ORAL_TABLET | Freq: Every evening | ORAL | Status: DC | PRN
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
## 2015-11-12 NOTE — Discharge Instructions (Signed)
Take trazodone as directed (at bedtime as needed for sleep), continue usual home medications Please follow up with your primary doctor in 3-5 days for discussion of your diagnoses and further evaluation after today's visit Please return to the ER for new or worsening symptoms, any additional concerns.
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
## 2015-11-26 ENCOUNTER — Emergency Department (HOSPITAL_COMMUNITY): Payer: Non-veteran care

## 2015-11-26 ENCOUNTER — Encounter (HOSPITAL_COMMUNITY): Payer: Self-pay | Admitting: Emergency Medicine

## 2015-11-26 ENCOUNTER — Emergency Department (HOSPITAL_COMMUNITY)
Admission: EM | Admit: 2015-11-26 | Discharge: 2015-11-26 | Disposition: A | Discharge status: Home / Self Care | Payer: Non-veteran care | Attending: Emergency Medicine | Admitting: Emergency Medicine

## 2015-11-26 DIAGNOSIS — R0602 Shortness of breath: Secondary | ICD-10-CM | POA: Diagnosis present

## 2015-11-26 DIAGNOSIS — J441 Chronic obstructive pulmonary disease with (acute) exacerbation: Secondary | ICD-10-CM | POA: Diagnosis not present

## 2015-11-26 DIAGNOSIS — Z7951 Long term (current) use of inhaled steroids: Secondary | ICD-10-CM | POA: Insufficient documentation

## 2015-11-26 DIAGNOSIS — I10 Essential (primary) hypertension: Secondary | ICD-10-CM | POA: Diagnosis not present

## 2015-11-26 DIAGNOSIS — Z9889 Other specified postprocedural states: Secondary | ICD-10-CM | POA: Insufficient documentation

## 2015-11-26 DIAGNOSIS — Z79899 Other long term (current) drug therapy: Secondary | ICD-10-CM | POA: Diagnosis not present

## 2015-11-26 DIAGNOSIS — Z87891 Personal history of nicotine dependence: Secondary | ICD-10-CM | POA: Diagnosis not present

## 2015-11-26 LAB — COMPREHENSIVE METABOLIC PANEL
ALBUMIN: 3.7 g/dL (ref 3.5–5.0)
ALK PHOS: 73 U/L (ref 38–126)
ALT: 16 U/L — ABNORMAL LOW (ref 17–63)
AST: 16 U/L (ref 15–41)
Anion gap: 12 (ref 5–15)
BILIRUBIN TOTAL: 0.6 mg/dL (ref 0.3–1.2)
BUN: 11 mg/dL (ref 6–20)
CO2: 24 mmol/L (ref 22–32)
CREATININE: 0.63 mg/dL (ref 0.61–1.24)
Calcium: 9.4 mg/dL (ref 8.9–10.3)
Chloride: 102 mmol/L (ref 101–111)
GFR calc Af Amer: >60 mL/min (ref >60)
GFR calc non Af Amer: >60 mL/min (ref >60)
GLUCOSE: 106 mg/dL — AB (ref 65–99)
Potassium: 4 mmol/L (ref 3.5–5.1)
Sodium: 138 mmol/L (ref 135–145)
Total Protein: 7.5 g/dL (ref 6.5–8.1)

## 2015-11-26 LAB — CBC WITH DIFFERENTIAL/PLATELET
BASOS ABS: 0 10*3/uL (ref 0.0–0.1)
BASOS PCT: 0 %
Eosinophils Absolute: 0.3 10*3/uL (ref 0.0–0.7)
Eosinophils Relative: 3 %
HEMATOCRIT: 39.5 % (ref 39.0–52.0)
HEMOGLOBIN: 13.3 g/dL (ref 13.0–17.0)
LYMPHS PCT: 12 %
Lymphs Abs: 1.4 10*3/uL (ref 0.7–4.0)
MCH: 30 pg (ref 26.0–34.0)
MCHC: 33.7 g/dL (ref 30.0–36.0)
MCV: 89 fL (ref 78.0–100.0)
MONO ABS: 0.3 10*3/uL (ref 0.1–1.0)
MONOS PCT: 2 %
NEUTROS ABS: 9.5 10*3/uL — AB (ref 1.7–7.7)
NEUTROS PCT: 83 %
Platelets: 346 10*3/uL (ref 150–400)
RBC: 4.44 MIL/uL (ref 4.22–5.81)
RDW: 13.8 % (ref 11.5–15.5)
WBC: 11.5 10*3/uL — ABNORMAL HIGH (ref 4.0–10.5)

## 2015-11-26 LAB — TROPONIN I

## 2015-11-26 MED ORDER — LEVOFLOXACIN 500 MG PO TABS
500.0000 mg | ORAL_TABLET | Freq: Once | ORAL | Status: AC
  Administered 2015-11-26: 500 mg via ORAL
  Filled 2015-11-26: qty 1

## 2015-11-26 MED ORDER — METHYLPREDNISOLONE SODIUM SUCC 125 MG IJ SOLR
125.0000 mg | Freq: Once | INTRAMUSCULAR | Status: AC
  Administered 2015-11-26: 125 mg via INTRAVENOUS
  Filled 2015-11-26: qty 2

## 2015-11-26 MED ORDER — HYDROCOD POLST-CPM POLST ER 10-8 MG/5ML PO SUER
5.0000 mL | Freq: Once | ORAL | Status: AC
  Administered 2015-11-26: 5 mL via ORAL
  Filled 2015-11-26: qty 5

## 2015-11-26 MED ORDER — HYDROCOD POLST-CPM POLST ER 10-8 MG/5ML PO SUER: 5.0000 mL | Freq: Two times a day (BID) | ORAL | Status: DC | PRN

## 2015-11-26 MED ORDER — ALPRAZOLAM 0.5 MG PO TABS: 0.5000 mg | ORAL_TABLET | Freq: Every evening | ORAL | Status: DC | PRN

## 2015-11-26 MED ORDER — PREDNISONE 50 MG PO TABS: ORAL_TABLET | ORAL | Status: DC

## 2015-11-26 MED ORDER — IPRATROPIUM-ALBUTEROL 0.5-2.5 (3) MG/3ML IN SOLN
3.0000 mL | Freq: Once | RESPIRATORY_TRACT | Status: AC
  Administered 2015-11-26: 3 mL via RESPIRATORY_TRACT
  Filled 2015-11-26: qty 3

## 2015-11-26 MED ORDER — LEVOFLOXACIN 500 MG PO TABS: 500.0000 mg | ORAL_TABLET | Freq: Every day | ORAL | Status: DC

## 2015-11-26 MED ORDER — SODIUM CHLORIDE 0.9 % IV BOLUS (SEPSIS)
500.0000 mL | Freq: Once | INTRAVENOUS | Status: AC
  Administered 2015-11-26: 500 mL via INTRAVENOUS

## 2015-11-26 NOTE — Discharge Instructions (Signed)
Chest x-ray showed no pneumonia. Prescription for antibiotic, cough syrup, prednisone, sleeping medication. Continue to use your nebulizer machine. Return here if worse.

## 2015-11-26 NOTE — ED Notes (Signed)
MD at bedside. 
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
## 2015-11-26 NOTE — ED Notes (Signed)
Bed: WA02 Expected date:  Expected time:  Means of arrival:  Comments: EMS- 65yo M, SOB/COPD

## 2015-11-28 ENCOUNTER — Telehealth (HOSPITAL_COMMUNITY): Payer: Self-pay

## 2015-11-28 NOTE — Telephone Encounter (Signed)
Called our office to request an additional [facility] order for 2 additional weeks.

## 2015-12-02 ENCOUNTER — Telehealth (HOSPITAL_COMMUNITY): Payer: Self-pay

## 2015-12-04 ENCOUNTER — Telehealth (HOSPITAL_COMMUNITY): Payer: Self-pay

## 2015-12-19 ENCOUNTER — Inpatient Hospital Stay (HOSPITAL_COMMUNITY)
Admission: EM | Admit: 2015-12-19 | Discharge: 2015-12-22 | DRG: 871 | Disposition: A | Discharge status: Home / Self Care | Payer: Non-veteran care | Attending: Internal Medicine | Admitting: Internal Medicine

## 2015-12-19 ENCOUNTER — Emergency Department (HOSPITAL_COMMUNITY): Payer: Non-veteran care

## 2015-12-19 ENCOUNTER — Encounter (HOSPITAL_COMMUNITY): Payer: Self-pay | Admitting: Emergency Medicine

## 2015-12-19 DIAGNOSIS — A419 Sepsis, unspecified organism: Secondary | ICD-10-CM | POA: Diagnosis present

## 2015-12-19 DIAGNOSIS — I10 Essential (primary) hypertension: Secondary | ICD-10-CM | POA: Diagnosis present

## 2015-12-19 DIAGNOSIS — N400 Enlarged prostate without lower urinary tract symptoms: Secondary | ICD-10-CM | POA: Diagnosis present

## 2015-12-19 DIAGNOSIS — K219 Gastro-esophageal reflux disease without esophagitis: Secondary | ICD-10-CM | POA: Diagnosis present

## 2015-12-19 DIAGNOSIS — J9621 Acute and chronic respiratory failure with hypoxia: Secondary | ICD-10-CM | POA: Diagnosis not present

## 2015-12-19 DIAGNOSIS — Z79899 Other long term (current) drug therapy: Secondary | ICD-10-CM | POA: Diagnosis not present

## 2015-12-19 DIAGNOSIS — J189 Pneumonia, unspecified organism: Secondary | ICD-10-CM | POA: Diagnosis present

## 2015-12-19 DIAGNOSIS — Z87891 Personal history of nicotine dependence: Secondary | ICD-10-CM | POA: Diagnosis not present

## 2015-12-19 DIAGNOSIS — F419 Anxiety disorder, unspecified: Secondary | ICD-10-CM | POA: Diagnosis present

## 2015-12-19 DIAGNOSIS — J441 Chronic obstructive pulmonary disease with (acute) exacerbation: Secondary | ICD-10-CM | POA: Diagnosis not present

## 2015-12-19 DIAGNOSIS — R0602 Shortness of breath: Secondary | ICD-10-CM | POA: Diagnosis present

## 2015-12-19 DIAGNOSIS — N390 Urinary tract infection, site not specified: Secondary | ICD-10-CM | POA: Diagnosis present

## 2015-12-19 DIAGNOSIS — E876 Hypokalemia: Secondary | ICD-10-CM | POA: Diagnosis present

## 2015-12-19 DIAGNOSIS — Y95 Nosocomial condition: Secondary | ICD-10-CM | POA: Diagnosis present

## 2015-12-19 DIAGNOSIS — Z7951 Long term (current) use of inhaled steroids: Secondary | ICD-10-CM

## 2015-12-19 DIAGNOSIS — J440 Chronic obstructive pulmonary disease with acute lower respiratory infection: Secondary | ICD-10-CM | POA: Diagnosis present

## 2015-12-19 LAB — BASIC METABOLIC PANEL
Anion gap: 14 (ref 5–15)
BUN: 9 mg/dL (ref 6–20)
CALCIUM: 9.2 mg/dL (ref 8.9–10.3)
CHLORIDE: 102 mmol/L (ref 101–111)
CO2: 23 mmol/L (ref 22–32)
CREATININE: 0.79 mg/dL (ref 0.61–1.24)
GFR calc non Af Amer: >60 mL/min (ref >60)
Glucose, Bld: 121 mg/dL — ABNORMAL HIGH (ref 65–99)
Potassium: 3.3 mmol/L — ABNORMAL LOW (ref 3.5–5.1)
Sodium: 139 mmol/L (ref 135–145)

## 2015-12-19 LAB — CBC WITH DIFFERENTIAL/PLATELET
BASOS PCT: 0 %
Basophils Absolute: 0 10*3/uL (ref 0.0–0.1)
EOS ABS: 0.4 10*3/uL (ref 0.0–0.7)
EOS PCT: 6 %
HCT: 35.4 % — ABNORMAL LOW (ref 39.0–52.0)
Hemoglobin: 12.2 g/dL — ABNORMAL LOW (ref 13.0–17.0)
LYMPHS ABS: 0.9 10*3/uL (ref 0.7–4.0)
Lymphocytes Relative: 12 %
MCH: 28.8 pg (ref 26.0–34.0)
MCHC: 34.5 g/dL (ref 30.0–36.0)
MCV: 83.7 fL (ref 78.0–100.0)
MONO ABS: 0.9 10*3/uL (ref 0.1–1.0)
MONOS PCT: 11 %
NEUTROS PCT: 71 %
Neutro Abs: 5.6 10*3/uL (ref 1.7–7.7)
PLATELETS: 378 10*3/uL (ref 150–400)
RBC: 4.23 MIL/uL (ref 4.22–5.81)
RDW: 14.2 % (ref 11.5–15.5)
WBC: 7.8 10*3/uL (ref 4.0–10.5)

## 2015-12-19 LAB — I-STAT CG4 LACTIC ACID, ED: LACTIC ACID, VENOUS: 1.87 mmol/L (ref 0.5–2.0)

## 2015-12-19 LAB — I-STAT TROPONIN, ED: TROPONIN I, POC: 0.01 ng/mL (ref 0.00–0.08)

## 2015-12-19 LAB — D-DIMER, QUANTITATIVE (NOT AT ARMC)

## 2015-12-19 MED ORDER — IPRATROPIUM-ALBUTEROL 0.5-2.5 (3) MG/3ML IN SOLN
3.0000 mL | Freq: Once | RESPIRATORY_TRACT | Status: AC
  Administered 2015-12-19: 3 mL via RESPIRATORY_TRACT
  Filled 2015-12-19: qty 3

## 2015-12-19 MED ORDER — SODIUM CHLORIDE 0.9 % IV BOLUS (SEPSIS)
1000.0000 mL | Freq: Once | INTRAVENOUS | Status: AC
  Administered 2015-12-20: 1000 mL via INTRAVENOUS

## 2015-12-19 MED ORDER — PANTOPRAZOLE SODIUM 40 MG PO TBEC
40.0000 mg | DELAYED_RELEASE_TABLET | Freq: Every day | ORAL | Status: DC
  Administered 2015-12-20 – 2015-12-22 (×3): 40 mg via ORAL
  Filled 2015-12-19 (×4): qty 1

## 2015-12-19 MED ORDER — LEVALBUTEROL HCL 1.25 MG/0.5ML IN NEBU
1.2500 mg | INHALATION_SOLUTION | Freq: Four times a day (QID) | RESPIRATORY_TRACT | Status: DC | PRN
  Administered 2015-12-22: 1.25 mg via RESPIRATORY_TRACT
  Filled 2015-12-19 (×2): qty 0.5

## 2015-12-19 MED ORDER — VANCOMYCIN HCL IN DEXTROSE 1-5 GM/200ML-% IV SOLN
1000.0000 mg | INTRAVENOUS | Status: AC
Start: 2015-12-19 — End: 2015-12-20
  Administered 2015-12-19: 1000 mg via INTRAVENOUS
  Filled 2015-12-19: qty 200

## 2015-12-19 MED ORDER — PIPERACILLIN-TAZOBACTAM 3.375 G IVPB
3.3750 g | Freq: Three times a day (TID) | INTRAVENOUS | Status: DC
  Administered 2015-12-20 – 2015-12-21 (×4): 3.375 g via INTRAVENOUS
  Filled 2015-12-19 (×5): qty 50

## 2015-12-19 MED ORDER — SODIUM CHLORIDE 0.9 % IV BOLUS (SEPSIS): 1000.0000 mL | Freq: Once | INTRAVENOUS | Status: DC

## 2015-12-19 MED ORDER — HYDROCODONE-ACETAMINOPHEN 7.5-325 MG/15ML PO SOLN
10.0000 mL | ORAL | Status: DC | PRN
  Administered 2015-12-20 – 2015-12-21 (×2): 20 mL via ORAL
  Administered 2015-12-21: 15 mL via ORAL
  Filled 2015-12-19 (×3): qty 30

## 2015-12-19 MED ORDER — IPRATROPIUM BROMIDE 0.02 % IN SOLN
0.5000 mg | RESPIRATORY_TRACT | Status: DC
  Administered 2015-12-20: 0.5 mg via RESPIRATORY_TRACT
  Filled 2015-12-19: qty 2.5

## 2015-12-19 MED ORDER — ENSURE ENLIVE PO LIQD
237.0000 mL | Freq: Three times a day (TID) | ORAL | Status: DC
  Administered 2015-12-20 – 2015-12-21 (×4): 237 mL via ORAL

## 2015-12-19 MED ORDER — HYDRALAZINE HCL 20 MG/ML IJ SOLN: 5.0000 mg | INTRAMUSCULAR | Status: DC | PRN

## 2015-12-19 MED ORDER — FLUTICASONE PROPIONATE 50 MCG/ACT NA SUSP
1.0000 | Freq: Every day | NASAL | Status: DC | PRN
  Administered 2015-12-22: 1 via NASAL
  Filled 2015-12-19: qty 16

## 2015-12-19 MED ORDER — DIPHENHYDRAMINE HCL 25 MG PO CAPS
50.0000 mg | ORAL_CAPSULE | Freq: Every evening | ORAL | Status: DC | PRN
Start: 2015-12-19 — End: 2015-12-22

## 2015-12-19 MED ORDER — SODIUM CHLORIDE 0.9 % IV SOLN
INTRAVENOUS | Status: DC
  Administered 2015-12-20 (×2): via INTRAVENOUS

## 2015-12-19 MED ORDER — METHYLPREDNISOLONE SODIUM SUCC 125 MG IJ SOLR
60.0000 mg | Freq: Two times a day (BID) | INTRAMUSCULAR | Status: DC
  Administered 2015-12-20 (×3): 60 mg via INTRAVENOUS
  Filled 2015-12-19 (×4): qty 0.96
  Filled 2015-12-19: qty 2

## 2015-12-19 MED ORDER — PIPERACILLIN-TAZOBACTAM 3.375 G IVPB 30 MIN
3.3750 g | INTRAVENOUS | Status: AC
  Administered 2015-12-19: 3.375 g via INTRAVENOUS
  Filled 2015-12-19: qty 50

## 2015-12-19 MED ORDER — ALPRAZOLAM 0.5 MG PO TABS
0.5000 mg | ORAL_TABLET | Freq: Every evening | ORAL | Status: DC | PRN
  Administered 2015-12-20 – 2015-12-21 (×3): 0.5 mg via ORAL
  Filled 2015-12-19 (×3): qty 1

## 2015-12-19 MED ORDER — DM-GUAIFENESIN ER 30-600 MG PO TB12
1.0000 | ORAL_TABLET | Freq: Two times a day (BID) | ORAL | Status: DC
  Administered 2015-12-20 – 2015-12-22 (×6): 1 via ORAL
  Filled 2015-12-19 (×9): qty 1

## 2015-12-19 MED ORDER — POTASSIUM CHLORIDE 20 MEQ/15ML (10%) PO SOLN
20.0000 meq | Freq: Once | ORAL | Status: AC
  Administered 2015-12-20: 20 meq via ORAL
  Filled 2015-12-19: qty 15

## 2015-12-19 MED ORDER — FINASTERIDE 5 MG PO TABS
5.0000 mg | ORAL_TABLET | Freq: Two times a day (BID) | ORAL | Status: DC
  Administered 2015-12-20 – 2015-12-21 (×4): 5 mg via ORAL
  Filled 2015-12-19 (×6): qty 1

## 2015-12-19 MED ORDER — ENOXAPARIN SODIUM 40 MG/0.4ML ~~LOC~~ SOLN
40.0000 mg | SUBCUTANEOUS | Status: DC
  Administered 2015-12-20 – 2015-12-21 (×2): 40 mg via SUBCUTANEOUS
  Filled 2015-12-19 (×3): qty 0.4

## 2015-12-19 MED ORDER — SODIUM CHLORIDE 0.9 % IV BOLUS (SEPSIS)
1000.0000 mL | INTRAVENOUS | Status: AC
  Administered 2015-12-19 – 2015-12-20 (×2): 1000 mL via INTRAVENOUS

## 2015-12-19 MED ORDER — ALBUTEROL SULFATE (2.5 MG/3ML) 0.083% IN NEBU
5.0000 mg | INHALATION_SOLUTION | Freq: Once | RESPIRATORY_TRACT | Status: AC
  Administered 2015-12-19: 5 mg via RESPIRATORY_TRACT
  Filled 2015-12-19: qty 6

## 2015-12-19 MED ORDER — PREDNISONE 20 MG PO TABS
60.0000 mg | ORAL_TABLET | Freq: Once | ORAL | Status: AC
  Administered 2015-12-19: 60 mg via ORAL
  Filled 2015-12-19: qty 3

## 2015-12-19 MED ORDER — VANCOMYCIN HCL IN DEXTROSE 750-5 MG/150ML-% IV SOLN
750.0000 mg | Freq: Two times a day (BID) | INTRAVENOUS | Status: DC
  Administered 2015-12-20 – 2015-12-21 (×3): 750 mg via INTRAVENOUS
  Filled 2015-12-19 (×3): qty 150

## 2015-12-19 NOTE — H&P (Signed)
[facility] History and Physical  [name] [name] [id number] DOB: Feb 02, 1951 DOA: 12/19/2015  Referring physician: ED physician PCP: Bud  Specialists:   Chief Complaint: Cough and shortness of breath  HPI: [name] [name] is a 65 y.o. male with PMH of hypertension, COPD, GERD, anxiety, alcohol abuse, BPH, who presents with cough and shortness of breath.  Patient reports that he has been having cough and shortness for several months, which has worsened in the past several days. He coughs up white mucus, no fever or chills. No chest pain. He has  runny nose, but no sore throat. Patient does not have abdominal pain, nausea, vomiting, diarrhea, unilateral weakness. Patient reports that he has dysuria and increased urinary frequency, but no burning on urination.  In ED, patient was found to have positive urinalysis with small amount of leukocytes, negative d-dimer, WBC 7.8, temperature normal, O2 sat 91%, tachycardia, tachypnea, potassium 3.3, creatinine normal. Chest x-ray showed lingular infiltration. Patient is admitted to inpatient for further eval and treatment.  EKG: Independently reviewed. QTC 549, tachycardia, RAE.   Where does patient live?   At home  Can patient participate in ADLs?  Yes   Review of Systems:   General: no fevers, chills, no changes in body weight, has poor appetite, has fatigue HEENT: no blurry vision, hearing changes or sore throat Pulm: has dyspnea, coughing, wheezing CV: no chest pain, no palpitations Abd: no nausea, vomiting, abdominal pain, diarrhea, constipation GU: has dysuria, increased urinary frequency, no hematuria  Ext: no leg edema Neuro: no unilateral weakness, numbness, or tingling, no vision change or hearing loss Skin: no rash MSK: No muscle spasm, no deformity, no limitation of range of movement in spin Heme: No easy bruising.  Travel history: No recent long distant travel.  Allergy: No Known Allergies  Past Medical  History  Diagnosis Date  . HTN (hypertension)   . Bradycardia, sinus 12/24/11  . Syncope and collapse 12/24/11    2/2 hypotension in the setting of dehydration  . Hypotension 12/24/11    in the setting of dehydration   . Abnormal EKG 12/24/11    anteroseptal and lateral ST elevation, felt r/t early repolarization;  Cardiac cath 12/24/11 - normal coronary anatomy, EF 55-65%  . Marijuana use   . History of alcohol abuse     hospitalized for detox 2002  . COPD (chronic obstructive pulmonary disease) [facility])     Past Surgical History  Procedure Laterality Date  . Circumcision  1972  . Laceration repair  02/2004    arthroscopic debridement of triagular fibrocartilage tear/E-chart; right wrist  . Cardiac catheterization  12/24/11    normal coronary anatomy, EF 55-65%  . Left heart catheterization with coronary angiogram N/A 12/24/2011    Procedure: LEFT HEART CATHETERIZATION WITH CORONARY ANGIOGRAM;  Surgeon: Peter M Martinique, MD;  Location: [facility] CATH LAB;  Service: Cardiovascular;  Laterality: N/A;  . Peg placement N/A 10/09/2015    Procedure: PERCUTANEOUS ENDOSCOPIC GASTROSTOMY (PEG) PLACEMENT;  Surgeon: Judeth Horn, MD;  Location: [place];  Service: General;  Laterality: N/A;  . Esophagogastroduodenoscopy N/A 10/09/2015    Procedure: ESOPHAGOGASTRODUODENOSCOPY (EGD);  Surgeon: Judeth Horn, MD;  Location: [facility] ENDOSCOPY;  Service: General;  Laterality: N/A;  . Percutaneous tracheostomy N/A 10/09/2015    Procedure: PERCUTANEOUS TRACHEOSTOMY;  Surgeon: Judeth Horn, MD;  Location: [place];  Service: General;  Laterality: N/A;    Social History:  reports that he has quit smoking. He does not have any smokeless tobacco history on file. He  reports that he does not drink alcohol or use illicit drugs.  Family History:  Family History  Problem Relation Age of Onset  . Liver cancer Sister      Prior to Admission medications   Medication Sig Start Date End Date Taking? Authorizing Provider  albuterol  (PROVENTIL HFA;VENTOLIN HFA) 108 (90 BASE) MCG/ACT inhaler Inhale 1-2 puffs into the lungs every 6 (six) hours as needed for wheezing or shortness of breath. 04/03/15  Yes Donne Hazel, MD  albuterol (PROVENTIL) (2.5 MG/3ML) 0.083% nebulizer solution Take 3-6 mLs (2.5-5 mg total) by nebulization every 4 (four) hours as needed for wheezing or shortness of breath. 03/12/15  Yes Shanon Rosser, MD  ALPRAZolam Duanne Moron) 0.5 MG tablet Take 1 tablet (0.5 mg total) by mouth at bedtime as needed for anxiety or sleep. 11/26/15  Yes Nat Christen, MD  budesonide-formoterol [facility]) 160-4.5 MCG/ACT inhaler Inhale 2 puffs into the lungs 2 (two) times daily. 05/13/15  Yes Francesca Oman, DO  diphenhydrAMINE (BENADRYL) 25 MG tablet Take 50 mg by mouth at bedtime as needed for sleep.   Yes Historical Provider, MD  feeding supplement, ENSURE ENLIVE, (ENSURE ENLIVE) LIQD Take 237 mLs by mouth 3 (three) times daily between meals. 11/07/15  Yes Lisette Abu, PA-C  finasteride (PROSCAR) 5 MG tablet Take 5 mg by mouth 2 (two) times daily.    Yes Historical Provider, MD  guaiFENesin (MUCINEX) 600 MG 12 hr tablet Take by mouth 2 (two) times daily as needed for cough or to loosen phlegm.   Yes Historical Provider, MD  HYDROcodone-acetaminophen (HYCET) 7.5-325 mg/15 ml solution Take 10-20 mLs by mouth every 4 (four) hours as needed (Pain). 11/07/15  Yes Lisette Abu, PA-C  lisinopril-hydrochlorothiazide (PRINZIDE,ZESTORETIC) 10-12.5 MG per tablet Take 1 tablet by mouth daily.   Yes Historical Provider, MD  omeprazole (PRILOSEC) 20 MG capsule Take 20 mg by mouth 2 (two) times daily as needed (for acid reflux).    Yes Historical Provider, MD  albuterol (PROVENTIL HFA;VENTOLIN HFA) 108 (90 BASE) MCG/ACT inhaler Inhale 1-2 puffs into the lungs every 6 (six) hours as needed for wheezing or shortness of breath. Patient not taking: Reported on 11/26/2015 07/18/15   April Palumbo, MD  chlorpheniramine-HYDROcodone [facility]  ER) 10-8 MG/5ML SUER Take 5 mLs by mouth every 12 (twelve) hours as needed for cough. Patient not taking: Reported on 12/19/2015 11/26/15   Nat Christen, MD  flunisolide (NASALIDE) 25 MCG/ACT (0.025%) SOLN Place 2 sprays into the nose 2 (two) times daily as needed (nasal congestion).    Historical Provider, MD  levofloxacin (LEVAQUIN) 500 MG tablet Take 1 tablet (500 mg total) by mouth daily. Patient not taking: Reported on 12/19/2015 11/26/15   Nat Christen, MD  Phenyleph-Doxylamine-DM-APAP [facility] SEVERE COLD/FLU) 5-6.25-10-325 MG/15ML LIQD Take 30 mLs by mouth at bedtime as needed (for cold/sleep).    Historical Provider, MD  predniSONE (DELTASONE) 50 MG tablet 1 tablet for 4 days, one half tablet for 4 days Patient not taking: Reported on 12/19/2015 11/26/15   Nat Christen, MD  Respiratory Therapy Supplies (NEBULIZER) DEVI 1 Device by Does not apply route as needed. 04/03/15   Donne Hazel, MD  traZODone (DESYREL) 50 MG tablet Take 1 tablet (50 mg total) by mouth at bedtime as needed for sleep. Patient not taking: Reported on 12/19/2015 11/12/15   Ozella Almond Ward, PA-C    Physical Exam: Filed Vitals:   12/19/15 2215 12/19/15 2256 12/20/15 0031 12/20/15 0151  BP:  119/73  130/79  Pulse:  110  111  Temp:    98.6 F (37 C)  TempSrc:    Oral  Resp:  26  26  Weight:      SpO2: 94% 94% 97% 96%   General: Not in acute distress HEENT:       Eyes: PERRL, EOMI, no scleral icterus.       ENT: No discharge from the ears and nose, no pharynx injection, no tonsillar enlargement.        Neck: No JVD, no bruit, no mass felt. Heme: No neck lymph node enlargement. Cardiac: S1/S2, RRR, No murmurs, No gallops or rubs. Pulm: has rales, wheezing, rhonchi bilaterally. Abd: Soft, nondistended, nontender, no rebound pain, no organomegaly, BS present. Ext: No pitting leg edema bilaterally. 2+DP/PT pulse bilaterally. Musculoskeletal: No joint deformities, No joint redness or warmth, no limitation of ROM in  spin. Skin: No rashes.  Neuro: Alert, oriented X3, cranial nerves II-XII grossly intact, moves all extremities normally. Psych: Patient is not psychotic, no suicidal or hemocidal ideation.  Labs on Admission:  Basic Metabolic Panel:  Recent Labs Lab 12/19/15 2126  NA 139  K 3.3*  CL 102  CO2 23  GLUCOSE 121*  BUN 9  CREATININE 0.79  CALCIUM 9.2   Liver Function Tests: No results for input(s): AST, ALT, ALKPHOS, BILITOT, PROT, ALBUMIN in the last 168 hours. No results for input(s): LIPASE, AMYLASE in the last 168 hours. No results for input(s): AMMONIA in the last 168 hours. CBC:  Recent Labs Lab 12/19/15 2126  WBC 7.8  NEUTROABS 5.6  HGB 12.2*  HCT 35.4*  MCV 83.7  PLT 378   Cardiac Enzymes: No results for input(s): CKTOTAL, CKMB, CKMBINDEX, TROPONINI in the last 168 hours.  BNP (last 3 results)  Recent Labs  04/02/15 0352 04/05/15 0725 07/18/15 0424  BNP 9.7 5.2 11.1    ProBNP (last 3 results) No results for input(s): PROBNP in the last 8760 hours.  CBG: No results for input(s): GLUCAP in the last 168 hours.  Radiological Exams on Admission: Dg Chest 2 View  12/19/2015  CLINICAL DATA:  Cough for 2 months EXAM: CHEST  2 VIEW COMPARISON:  11/26/2015 FINDINGS: The heart size and vascular pattern are normal. There is a nodular opacity over the left lower lobe and 1 over the right lower lobe in similar position. These are felt to represent nipple shadows. There is mild opacity in the lingula. There is an element of hyperinflation suggesting COPD. There is also mild perihilar bronchial wall thickening which is stable. IMPRESSION: COPD with mild bronchitic change. Possible lingular infiltrate which could represent pneumonia. Followup PA and lateral chest X-ray is recommended in 3-4 weeks following trial of antibiotic therapy to ensure resolution and exclude underlying malignancy. Nodular opacities likely nipple shadows given the bilateral symmetry. This could be  confirmed with nipple markers on a repeat PA radiograph. Electronically Signed   By: Skipper Cliche M.D.   On: 12/19/2015 20:48    Assessment/Plan Principal Problem:   HCAP (healthcare-associated pneumonia) Active Problems:   COPD with acute exacerbation (HCC)   HTN (hypertension)   Acute and chronic respiratory failure with hypoxia (HCC)   Anxiety   BPH (benign prostatic hyperplasia)   Sepsis ([place])   UTI (urinary tract infection)   Acute on chronic respiratory failure with hypoxia due to HCAP and COPD exacerbation: CXR showed lingular infiltration for HCAP. Patient has bilateral wheezing and rhonchi, consistent with COPD exacerbation.  - will admit to tele  bed  - IV Vancomycin, Zosyn - Mucinex for cough  - solumedrol 60 mg bid - Atroven Nebs and Xopenex Neb prn for SOB - Urine legionella and S. pneumococcal antigen - Follow up blood culture x2, sputum culture and respiratory virus panel, plus Flu pcr  Sepsis due to HCAP and COPD exacerbation: Patient meets criteria for sepsis with tachycardia and tachypnea. Lactate is normal. Hemodynamically stable.  - will get Procalcitonin and trend lactic acid level per sepsis protocol - IVF: 2L of NS bolus in ED, followed by 75 mL per hour of NS   HTN (hypertension) -Hold prinzide since pt is at risk of developing hypotension secondary to sepsis -IV hydralazine when necessary  BPH: stable - Continue proscar  Possible UTI: Patient has dysuria and increased urinary frequency. Urinalysis positive for UTI with small mononuclear side. -On IV vancomycin and Zosyn as above -Follow-up urine culture  GERD: -Protonix  Hypokalemia: K=3.3 on admission. - Repleted - Check Mg level  DVT ppx: SQ Lovenox  Code Status: Full code Family Communication: None at bed side.  Disposition Plan: Admit to inpatient   Date of Service 12/20/2015    Ivor Costa [facility] Pager [phone]  If 7PM-7AM, please contact  night-coverage [email or web address] Password [email or web address] 12/20/2015, 2:55 AM

## 2015-12-19 NOTE — Progress Notes (Signed)
Pharmacy Antibiotic Note  [name] [name] is a 65 y.o. male admitted on 12/19/2015 with sepsis.  Patient presents with complaint of productive cough and h/o COPD.   Chest Xray suggestive of pneumonia. Pharmacy has been consulted for Vancomycin & Zosyn dosing.  Plan:  Vancomycin 1gm IV x 1  Zosyn 3.375gm IV x 1  F/U Scr then write standing regimen for both antibiotics  Weight: 137 lb 2 oz (62.2 kg)  Temp (24hrs), Avg:98.2 F (36.8 C), Min:98.2 F (36.8 C), Max:98.2 F (36.8 C)   Recent Labs Lab 12/19/15 2126  WBC 7.8    CrCl cannot be calculated (Patient has no serum creatinine result on file.).    No Known Allergies  Antimicrobials this admission: 4/6 Vanc >>   4/6 Zosyn >>    Dose adjustments this admission:    Microbiology results: 4/6 BCx:   4/6 UCx:     Thank you for allowing pharmacy to be a part of this patient's care.  Everette Rank, PharmD 12/19/2015 10:06 PM
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
## 2015-12-19 NOTE — ED Notes (Signed)
Pt advised per MD order U/A & culture needed for lab. Pt states unable to urinate at time but will provide sample as soon as possible. Pt provided w/urinal, labeled specimen cup at bedside. ENM

## 2015-12-19 NOTE — ED Notes (Signed)
Pt states he has had a cough x 2 months for which he has seen his PCP who prescribed him mucinex. Pt gets winded upon exacerbation and winded when he talks. Pt has a productive cough and a hx of of COPD. Pt denies chest pain, nausea, dizziness, or light headedness

## 2015-12-19 NOTE — ED Notes (Signed)
Asked for urine  

## 2015-12-19 NOTE — Progress Notes (Signed)
Brief pharmacy note:  Zosyn/Vancomycin  See note by L Poindexter for full details (4/6)  Assessement:  Scr=0.79 >CrCl~76 (N)  Plan:  Zosyn 3.375 Gm IV q8h EI  Vancomycin 750mg  IV q12h  F/u Scr/cultures/levels as needed  Thanks Dorrene German 12/19/2015 10:57 PM

## 2015-12-20 ENCOUNTER — Encounter (HOSPITAL_COMMUNITY): Payer: Self-pay | Admitting: Internal Medicine

## 2015-12-20 DIAGNOSIS — N390 Urinary tract infection, site not specified: Secondary | ICD-10-CM | POA: Diagnosis present

## 2015-12-20 LAB — I-STAT CG4 LACTIC ACID, ED: LACTIC ACID, VENOUS: 1.77 mmol/L (ref 0.5–2.0)

## 2015-12-20 LAB — PROCALCITONIN

## 2015-12-20 LAB — URINE MICROSCOPIC-ADD ON: Squamous Epithelial / LPF: NONE SEEN

## 2015-12-20 LAB — PROTIME-INR
INR: 1.29 (ref 0.00–1.49)
PROTHROMBIN TIME: 15.7 s — AB (ref 11.6–15.2)

## 2015-12-20 LAB — URINALYSIS, ROUTINE W REFLEX MICROSCOPIC
BILIRUBIN URINE: NEGATIVE
GLUCOSE, UA: NEGATIVE mg/dL
HGB URINE DIPSTICK: NEGATIVE
Ketones, ur: 40 mg/dL — AB
Nitrite: NEGATIVE
PH: 6 (ref 5.0–8.0)
Protein, ur: NEGATIVE mg/dL
SPECIFIC GRAVITY, URINE: 1.026 (ref 1.005–1.030)

## 2015-12-20 LAB — STREP PNEUMONIAE URINARY ANTIGEN: Strep Pneumo Urinary Antigen: NEGATIVE

## 2015-12-20 LAB — INFLUENZA PANEL BY PCR (TYPE A & B)
H1N1FLUPCR: NOT DETECTED
INFLAPCR: NEGATIVE
INFLBPCR: NEGATIVE

## 2015-12-20 LAB — MAGNESIUM: MAGNESIUM: 1.6 mg/dL — AB (ref 1.7–2.4)

## 2015-12-20 LAB — HIV ANTIBODY (ROUTINE TESTING W REFLEX): HIV SCREEN 4TH GENERATION: NONREACTIVE

## 2015-12-20 LAB — APTT: aPTT: 33 seconds (ref 24–37)

## 2015-12-20 MED ORDER — IPRATROPIUM BROMIDE 0.02 % IN SOLN
0.5000 mg | Freq: Four times a day (QID) | RESPIRATORY_TRACT | Status: DC | PRN
  Administered 2015-12-21 – 2015-12-22 (×2): 0.5 mg via RESPIRATORY_TRACT
  Filled 2015-12-20 (×2): qty 2.5

## 2015-12-20 MED ORDER — BENZONATATE 100 MG PO CAPS
200.0000 mg | ORAL_CAPSULE | Freq: Three times a day (TID) | ORAL | Status: DC | PRN
  Administered 2015-12-20 (×2): 200 mg via ORAL
  Filled 2015-12-20 (×2): qty 2

## 2015-12-20 MED ORDER — MAGNESIUM SULFATE 2 GM/50ML IV SOLN
2.0000 g | Freq: Once | INTRAVENOUS | Status: AC
  Administered 2015-12-20: 2 g via INTRAVENOUS
  Filled 2015-12-20: qty 50
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
## 2015-12-20 NOTE — ED Notes (Signed)
Floor nurse call RT for septum sample and gram stain

## 2015-12-20 NOTE — ED Notes (Signed)
Called floor for report ( will call back in 15 mins)

## 2015-12-21 DIAGNOSIS — J189 Pneumonia, unspecified organism: Secondary | ICD-10-CM

## 2015-12-21 DIAGNOSIS — A419 Sepsis, unspecified organism: Principal | ICD-10-CM

## 2015-12-21 DIAGNOSIS — J441 Chronic obstructive pulmonary disease with (acute) exacerbation: Secondary | ICD-10-CM

## 2015-12-21 LAB — LEGIONELLA PNEUMOPHILA SEROGP 1 UR AG: L. pneumophila Serogp 1 Ur Ag: NEGATIVE

## 2015-12-21 LAB — BASIC METABOLIC PANEL
ANION GAP: 8 (ref 5–15)
BUN: 8 mg/dL (ref 6–20)
CALCIUM: 9.1 mg/dL (ref 8.9–10.3)
CO2: 23 mmol/L (ref 22–32)
Chloride: 106 mmol/L (ref 101–111)
Creatinine, Ser: 0.61 mg/dL (ref 0.61–1.24)
GFR calc Af Amer: >60 mL/min (ref >60)
Glucose, Bld: 147 mg/dL — ABNORMAL HIGH (ref 65–99)
POTASSIUM: 4.2 mmol/L (ref 3.5–5.1)
SODIUM: 137 mmol/L (ref 135–145)

## 2015-12-21 LAB — RESPIRATORY VIRUS PANEL
Adenovirus: NEGATIVE
Influenza A: NEGATIVE
Influenza B: NEGATIVE
METAPNEUMOVIRUS: NEGATIVE
PARAINFLUENZA 3 A: NEGATIVE
Parainfluenza 1: NEGATIVE
Parainfluenza 2: NEGATIVE
RESPIRATORY SYNCYTIAL VIRUS A: NEGATIVE
Respiratory Syncytial Virus B: NEGATIVE
Rhinovirus: NEGATIVE

## 2015-12-21 LAB — EXPECTORATED SPUTUM ASSESSMENT W GRAM STAIN, RFLX TO RESP C

## 2015-12-21 LAB — URINE CULTURE: Culture: NO GROWTH

## 2015-12-21 LAB — EXPECTORATED SPUTUM ASSESSMENT W REFEX TO RESP CULTURE

## 2015-12-21 LAB — HIV ANTIBODY (ROUTINE TESTING W REFLEX): HIV SCREEN 4TH GENERATION: NONREACTIVE

## 2015-12-21 LAB — MRSA PCR SCREENING: MRSA BY PCR: NEGATIVE

## 2015-12-21 MED ORDER — PREDNISONE 20 MG PO TABS
40.0000 mg | ORAL_TABLET | Freq: Every day | ORAL | Status: DC
  Administered 2015-12-22: 40 mg via ORAL
  Filled 2015-12-21 (×2): qty 2

## 2015-12-21 MED ORDER — AMOXICILLIN-POT CLAVULANATE 875-125 MG PO TABS
1.0000 | ORAL_TABLET | Freq: Two times a day (BID) | ORAL | Status: DC
  Administered 2015-12-21 – 2015-12-22 (×3): 1 via ORAL
  Filled 2015-12-21 (×4): qty 1

## 2015-12-21 MED ORDER — FINASTERIDE 5 MG PO TABS
5.0000 mg | ORAL_TABLET | Freq: Every day | ORAL | Status: DC
  Administered 2015-12-22: 5 mg via ORAL
  Filled 2015-12-21: qty 1

## 2015-12-21 MED ORDER — LISINOPRIL-HYDROCHLOROTHIAZIDE 10-12.5 MG PO TABS: 1.0000 | ORAL_TABLET | Freq: Every day | ORAL | Status: DC

## 2015-12-21 MED ORDER — TERAZOSIN HCL 5 MG PO CAPS
5.0000 mg | ORAL_CAPSULE | Freq: Every day | ORAL | Status: DC
  Administered 2015-12-21: 5 mg via ORAL
  Filled 2015-12-21 (×2): qty 1

## 2015-12-21 MED ORDER — BENZONATATE 100 MG PO CAPS
200.0000 mg | ORAL_CAPSULE | Freq: Three times a day (TID) | ORAL | Status: DC
  Administered 2015-12-21 (×2): 200 mg via ORAL
  Administered 2015-12-21: 100 mg via ORAL
  Administered 2015-12-22: 200 mg via ORAL
  Filled 2015-12-21 (×3): qty 2
  Filled 2015-12-21: qty 1
  Filled 2015-12-21: qty 2

## 2015-12-21 MED ORDER — LISINOPRIL 10 MG PO TABS
10.0000 mg | ORAL_TABLET | Freq: Every day | ORAL | Status: DC
  Administered 2015-12-21 – 2015-12-22 (×2): 10 mg via ORAL
  Filled 2015-12-21 (×2): qty 1

## 2015-12-21 MED ORDER — HYDROCHLOROTHIAZIDE 12.5 MG PO CAPS
12.5000 mg | ORAL_CAPSULE | Freq: Every day | ORAL | Status: DC
  Administered 2015-12-21 – 2015-12-22 (×2): 12.5 mg via ORAL
  Filled 2015-12-21 (×2): qty 1

## 2015-12-21 NOTE — Progress Notes (Signed)
[facility] Progress Note   [name] [name]  [id number]  DOB: [birth year]  DOA: 12/19/2015 PCP: [place]  Brief narrative: [name] [name] is a 65 y.o. male with COPD, BPH, GERD presents for cough and dyspnea.    Subjective: Cough is dry and very bad. No pain or dyspnea.   Assessment/Plan: Principal Problem:   Sepsis - HCAP (healthcare-associated pneumonia) - d/c Vanc as MRSA PCR negative- change Zosyn to Augmentin  Active Problems:   COPD with acute exacerbation  - wean solumedrol to Prednisone- cont nebs - Tessalon TID, Mucinex DM BID for cough     HTN (hypertension) - resume Lisinopril/ HCTZ    BPH (benign prostatic hyperplasia) - Proscar in AM and Terazosin at bedtime    Anxiety - Xanax PRN QHS    UTI (urinary tract infection)? - culture negative so far    Hypomagnesemia - replaced   Antibiotics: Anti-infectives    Start     Dose/Rate Route Frequency Ordered Stop   12/20/15 1200  vancomycin (VANCOCIN) IVPB 750 mg/150 ml premix     750 mg 150 mL/hr over 60 Minutes Intravenous Every 12 hours 12/19/15 2344     12/20/15 0800  piperacillin-tazobactam (ZOSYN) IVPB 3.375 g     3.375 g 12.5 mL/hr over 240 Minutes Intravenous Every 8 hours 12/19/15 2310     12/19/15 2145  piperacillin-tazobactam (ZOSYN) IVPB 3.375 g     3.375 g 100 mL/hr over 30 Minutes Intravenous STAT 12/19/15 2141 12/19/15 2334   12/19/15 2145  vancomycin (VANCOCIN) IVPB 1000 mg/200 mL premix     1,000 mg 200 mL/hr over 60 Minutes Intravenous STAT 12/19/15 2141 12/20/15 0133     Code Status:     Code Status Orders        Start     Ordered   12/19/15 2348  Full code   Continuous     12/19/15 2348    Code Status History    Date Active Date Inactive Code Status Order ID Comments User Context   09/18/2015  8:37 PM 11/07/2015  6:00 PM Full Code [id number]  Judeth Horn, MD ED   05/11/2015 12:24 PM 05/13/2015  7:46 PM Full Code [id number]  Francesca Oman, DO Inpatient   04/02/2015   9:27 AM 04/03/2015  3:52 PM Full Code [id number]  Quintella Baton, MD Inpatient     Family Communication:  Disposition Plan: hopefully home tomorrow DVT prophylaxis: lovenox Consultants:  Procedures:     Objective: Filed Weights   12/19/15 2139 12/20/15 0510  Weight: 62.2 kg (137 lb 2 oz) 60.963 kg (134 lb 6.4 oz)    Intake/Output Summary (Last 24 hours) at 12/21/15 1429 Last data filed at 12/20/15 1900  Gross per 24 hour  Intake  937.5 ml  Output    100 ml  Net  837.5 ml     Vitals Filed Vitals:   12/20/15 1416 12/20/15 2126 12/21/15 0503 12/21/15 1048  BP: 111/65 124/88 152/91   Pulse: 98 106 104   Temp: 98.1 F (36.7 C) 98.4 F (36.9 C) 98 F (36.7 C)   TempSrc: Oral Oral Oral   Resp: 20 19 19    Height:      Weight:      SpO2: 98% 97% 97% 95%    Exam:  General:  Pt is alert, not in acute distress  HEENT: No icterus, No thrush, oral mucosa moist  Cardiovascular: regular rate and rhythm, S1/S2 No murmur  Respiratory: clear to auscultation bilaterally  Abdomen: Soft, +Bowel sounds, non tender, non distended, no guarding  MSK: No cyanosis or clubbing- no pedal edema   Data Reviewed: Basic Metabolic Panel:  Recent Labs Lab 12/19/15 2126 12/20/15 0514 12/21/15 0603  NA 139  --  137  K 3.3*  --  4.2  CL 102  --  106  CO2 23  --  23  GLUCOSE 121*  --  147*  BUN 9  --  8  CREATININE 0.79  --  0.61  CALCIUM 9.2  --  9.1  MG  --  1.6*  --    Liver Function Tests: No results for input(s): AST, ALT, ALKPHOS, BILITOT, PROT, ALBUMIN in the last 168 hours. No results for input(s): LIPASE, AMYLASE in the last 168 hours. No results for input(s): AMMONIA in the last 168 hours. CBC:  Recent Labs Lab 12/19/15 2126  WBC 7.8  NEUTROABS 5.6  HGB 12.2*  HCT 35.4*  MCV 83.7  PLT 378   Cardiac Enzymes: No results for input(s): CKTOTAL, CKMB, CKMBINDEX, TROPONINI in the last 168 hours. BNP (last 3 results)  Recent Labs  04/02/15 0352 04/05/15 0725  07/18/15 0424  BNP 9.7 5.2 11.1    ProBNP (last 3 results) No results for input(s): PROBNP in the last 8760 hours.  CBG: No results for input(s): GLUCAP in the last 168 hours.  Recent Results (from the past 240 hour(s))  Blood Culture (routine x 2)     Status: None (Preliminary result)   Collection Time: 12/19/15  9:57 PM  Result Value Ref Range Status   Specimen Description BLOOD BLOOD RIGHT FOREARM  Final   Special Requests BOTTLES DRAWN AEROBIC AND ANAEROBIC 5 CC  Final   Culture   Final    NO GROWTH 1 DAY Performed at [facility]    Report Status PENDING  Incomplete  Blood Culture (routine x 2)     Status: None (Preliminary result)   Collection Time: 12/19/15  9:57 PM  Result Value Ref Range Status   Specimen Description BLOOD RIGHT HAND  Final   Special Requests BOTTLES DRAWN AEROBIC AND ANAEROBIC 5 CC  Final   Culture   Final    NO GROWTH 1 DAY Performed at [facility]    Report Status PENDING  Incomplete  Urine culture     Status: None   Collection Time: 12/20/15  1:04 AM  Result Value Ref Range Status   Specimen Description URINE, CLEAN CATCH  Final   Special Requests NONE  Final   Culture   Final    NO GROWTH 1 DAY Performed at [facility]    Report Status 12/21/2015 FINAL  Final  Respiratory virus panel     Status: None   Collection Time: 12/20/15  1:50 AM  Result Value Ref Range Status   Respiratory Syncytial Virus A Negative Negative Final   Respiratory Syncytial Virus B Negative Negative Final   Influenza A Negative Negative Final   Influenza B Negative Negative Final   Parainfluenza 1 Negative Negative Final   Parainfluenza 2 Negative Negative Final   Parainfluenza 3 Negative Negative Final   Metapneumovirus Negative Negative Final   Rhinovirus Negative Negative Final   Adenovirus Negative Negative Final    Comment: (NOTE) Performed At: [facility] [address] [place], [place] [id number] Lindon Romp MD  [id number]   Culture, sputum-assessment     Status: None   Collection Time: 12/21/15  6:54 AM  Result Value Ref Range  Status   Specimen Description SPUTUM  Final   Special Requests NONE  Final   Sputum evaluation THIS SPECIMEN IS ACCEPTABLE FOR SPUTUM CULTURE  Final   Report Status 12/21/2015 FINAL  Final  MRSA PCR Screening     Status: None   Collection Time: 12/21/15  8:33 AM  Result Value Ref Range Status   MRSA by PCR NEGATIVE NEGATIVE Final    Comment:        The GeneXpert MRSA Assay (FDA approved for NASAL specimens only), is one component of a comprehensive MRSA colonization surveillance program. It is not intended to diagnose MRSA infection nor to guide or monitor treatment for MRSA infections.      Studies: Dg Chest 2 View  12/19/2015  CLINICAL DATA:  Cough for 2 months EXAM: CHEST  2 VIEW COMPARISON:  11/26/2015 FINDINGS: The heart size and vascular pattern are normal. There is a nodular opacity over the left lower lobe and 1 over the right lower lobe in similar position. These are felt to represent nipple shadows. There is mild opacity in the lingula. There is an element of hyperinflation suggesting COPD. There is also mild perihilar bronchial wall thickening which is stable. IMPRESSION: COPD with mild bronchitic change. Possible lingular infiltrate which could represent pneumonia. Followup PA and lateral chest X-ray is recommended in 3-4 weeks following trial of antibiotic therapy to ensure resolution and exclude underlying malignancy. Nodular opacities likely nipple shadows given the bilateral symmetry. This could be confirmed with nipple markers on a repeat PA radiograph. Electronically Signed   By: Skipper Cliche M.D.   On: 12/19/2015 20:48    Scheduled Meds:  Scheduled Meds: . benzonatate  200 mg Oral TID  . dextromethorphan-guaiFENesin  1 tablet Oral BID  . enoxaparin (LOVENOX) injection  40 mg Subcutaneous Q24H  . feeding supplement (ENSURE ENLIVE)  237 mL  Oral TID BM  . finasteride  5 mg Oral BID  . pantoprazole  40 mg Oral Daily  . piperacillin-tazobactam (ZOSYN)  IV  3.375 g Intravenous Q8H  . [START ON 12/22/2015] predniSONE  40 mg Oral Q breakfast  . vancomycin  750 mg Intravenous Q12H   Continuous Infusions:   Time spent on care of this patient: 35 min   [place], MD 12/21/2015, 2:29 PM  LOS: 2 days   [facility] Office  [phone] Pager - Text Page per [email or web address] If 7PM-7AM, please contact night-coverage [email or web address]

## 2015-12-21 NOTE — Evaluation (Signed)
Physical Therapy Evaluation [name] MRN: [id number] DOB: 1951/06/30 Today's Date: 12/21/2015   History of Present Illness  pt admitted with cough and shortness of breath  Clinical Impression  Pt is independent in bed mobility and gait without a device.  He does not need physical therapy and there are no recommendations.  He reports he is exercising on his own at home    Follow Up Recommendations No PT follow up    Equipment Recommendations  None recommended by PT    Recommendations for Other Services       Precautions / Restrictions Precautions Precaution Comments: droplet Restrictions Weight Bearing Restrictions: No      Mobility  Bed Mobility Overal bed mobility: Independent                Transfers Overall transfer level: Independent                  Ambulation/Gait Ambulation/Gait assistance: Independent Ambulation Distance (Feet): 150 Feet (pt states he walks more that .5 mile a day) Assistive device: None Gait Pattern/deviations: WFL(Within Functional Limits) Gait velocity:  (within normal limits,able to change speeds and directions)      Stairs            Wheelchair Mobility    Modified Rankin (Stroke Patients Only)       Balance                                   Berg Balance Test Sit to Stand: Able to stand without using hands and stabilize independently Standing Unsupported: Able to stand safely 2 minutes Sitting with Back Unsupported but Feet Supported on Floor or Stool: Able to sit safely and securely 2 minutes Stand to Sit: Sits safely with minimal use of hands Transfers: Able to transfer safely, minor use of hands Standing Unsupported with Eyes Closed: Able to stand 10 seconds safely Standing Ubsupported with Feet Together: Able to place feet together independently and stand 1 minute safely From Standing, Reach Forward with Outstretched Arm: Can reach confidently >25 cm  (10") From Standing Position, Pick up Object from Floor: Able to pick up shoe safely and easily From Standing Position, Turn to Look Behind Over each Shoulder: Looks behind from both sides and weight shifts well Turn 360 Degrees: Able to turn 360 degrees safely in 4 seconds or less Standing Unsupported, Alternately Place Feet on Step/Stool: Able to stand independently and safely and complete 8 steps in 20 seconds Standing Unsupported, One Foot in Front: Able to place foot tandem independently and hold 30 seconds Standing on One Leg: Able to lift leg independently and hold 5-10 seconds Total Score: 55         Pertinent Vitals/Pain Pain Assessment: No/denies pain    Home Living Family/patient expects to be discharged to:: Private residence Living Arrangements: Alone   Type of Home:  (pt is getting ready to move into his own place)                Prior Function Level of Independence: Independent;Independent with assistive device(s)         Comments: occasionally uses a cane, but he doesn't really think he needs it     Hand Dominance        Extremity/Trunk Assessment   Upper Extremity Assessment: Overall WFL for tasks assessed           Lower Extremity  Assessment: Overall WFL for tasks assessed         Communication   Communication: No difficulties  Cognition Arousal/Alertness: Awake/alert Behavior During Therapy: WFL for tasks assessed/performed                        General Comments General comments (skin integrity, edema, etc.): no coughing during exam. Pt wore a mask walking in hall, no c/o dyspnea    Exercises        Assessment/Plan    PT Assessment    PT Diagnosis     PT Problem List    PT Treatment Interventions     PT Goals (Current goals can be found in the Care Plan section) Acute Rehab PT Goals Patient Stated Goal: to go home    Frequency     Barriers to discharge        Co-evaluation               End of  Session     Patient left: in bed           Time: 1600-1615 PT Time Calculation (min) (ACUTE ONLY): 15 min   Charges:   PT Evaluation $PT Eval Low Complexity: 1 Procedure     PT G Codes:       Jermisha Hoffart K. Owens Shark, PT  12/21/2015, 4:22 PM

## 2015-12-21 NOTE — Progress Notes (Signed)
Pt's ambulating in hallway on  Room air sats 100%

## 2015-12-22 DIAGNOSIS — J9621 Acute and chronic respiratory failure with hypoxia: Secondary | ICD-10-CM

## 2015-12-22 MED ORDER — PREDNISONE 10 MG PO TABS: 10.0000 mg | ORAL_TABLET | Freq: Every day | ORAL | Status: DC

## 2015-12-22 MED ORDER — ALBUTEROL SULFATE (2.5 MG/3ML) 0.083% IN NEBU: 2.5000 mg | INHALATION_SOLUTION | RESPIRATORY_TRACT | Status: DC | PRN

## 2015-12-22 MED ORDER — IPRATROPIUM-ALBUTEROL 0.5-2.5 (3) MG/3ML IN SOLN
3.0000 mL | Freq: Four times a day (QID) | RESPIRATORY_TRACT | Status: DC
  Administered 2015-12-22: 3 mL via RESPIRATORY_TRACT
  Filled 2015-12-22 (×2): qty 3

## 2015-12-22 MED ORDER — AMOXICILLIN-POT CLAVULANATE 875-125 MG PO TABS: 1.0000 | ORAL_TABLET | Freq: Two times a day (BID) | ORAL | Status: DC

## 2015-12-22 MED ORDER — PREDNISONE 20 MG PO TABS
20.0000 mg | ORAL_TABLET | Freq: Every day | ORAL | Status: DC
  Filled 2015-12-22: qty 1

## 2015-12-22 NOTE — Progress Notes (Signed)
Pt leaving at this time with his brother-in-law. Alert, oriented, and without c/o. Pt left with cane, coat, clothes, and cell phone.Discharge instructions/prescriptions pickup given/explained with pt verbalizing understanding.

## 2015-12-22 NOTE — Progress Notes (Signed)
PT demonstrated verbal and hands on understanding of Flutter device. 

## 2015-12-22 NOTE — Discharge Summary (Signed)
Physician Discharge Summary  [name] [name] [id number] DOB: April 06, 1951 DOA: 12/19/2015  PCP: [place] date: 12/19/2015 Discharge date: 12/22/2015  Time spent: 50 minutes    Discharge Condition: stable    Discharge Diagnoses:  Principal Problem:   COPD with acute exacerbation ([place]) Active Problems:   HTN (hypertension)   HCAP (healthcare-associated pneumonia)   Anxiety   BPH (benign prostatic hyperplasia)   Hypomagnesemia   History of present illness:  [name] [name] is a 65 y.o. male with PMH of hypertension, COPD, GERD, anxiety, alcohol abuse, BPH, who presents with cough and shortness of breath.  Hospital Course:  Principal Problem:  Sepsis - HCAP (healthcare-associated pneumonia) - d/c Vanc as MRSA PCR negative- changed Zosyn to Augmentin - will d/c home with Augmentin today  Active Problems:  COPD with acute exacerbation  - weaned solumedrol to Prednisone- discharge on taper- cont nebs - Tessalon TID, Mucinex DM BID for cough   HTN (hypertension) - resume Lisinopril/ HCTZ   BPH (benign prostatic hyperplasia) - Proscar in AM and Terazosin at bedtime   Anxiety - Xanax PRN QHS   UTI (urinary tract infection)? - culture negative so far   Hypomagnesemia - replaced    Discharge Exam: Filed Weights   12/19/15 2139 12/20/15 0510  Weight: 62.2 kg (137 lb 2 oz) 60.963 kg (134 lb 6.4 oz)   Filed Vitals:   12/21/15 2125 12/22/15 0616  BP: 130/79 113/85  Pulse: 105 99  Temp: 97.7 F (36.5 C) 98.7 F (37.1 C)  Resp: 18 18    General: AAO x 3, no distress Cardiovascular: RRR, no murmurs  Respiratory: clear to auscultation bilaterally GI: soft, non-tender, non-distended, bowel sound positive  Discharge Instructions You were cared for by a hospitalist during your hospital stay. If you have any questions about your discharge medications or the care you received while you were in the hospital after you are discharged, you can call  the unit and asked to speak with the hospitalist on call if the hospitalist that took care of you is not available. Once you are discharged, your primary care physician will handle any further medical issues. Please note that NO REFILLS for any discharge medications will be authorized once you are discharged, as it is imperative that you return to your primary care physician (or establish a relationship with a primary care physician if you do not have one) for your aftercare needs so that they can reassess your need for medications and monitor your lab values.      Discharge Instructions    Diet - low sodium heart healthy    Complete by:  As directed      Discharge instructions    Complete by:  As directed   See your family doctor in 1 wk.     Increase activity slowly    Complete by:  As directed             Medication List    TAKE these medications        albuterol (2.5 MG/3ML) 0.083% nebulizer solution  Commonly known as:  PROVENTIL  Take 3-6 mLs (2.5-5 mg total) by nebulization every 4 (four) hours as needed for wheezing or shortness of breath.     albuterol 108 (90 Base) MCG/ACT inhaler  Commonly known as:  PROVENTIL HFA;VENTOLIN HFA  Inhale 1-2 puffs into the lungs every 6 (six) hours as needed for wheezing or shortness of breath.     ALPRAZolam 0.5 MG tablet  Commonly  known as:  XANAX  Take 1 tablet (0.5 mg total) by mouth at bedtime as needed for anxiety or sleep.     amoxicillin-clavulanate 875-125 MG tablet  Commonly known as:  AUGMENTIN  Take 1 tablet by mouth every 12 (twelve) hours.     benzonatate 100 MG capsule  Commonly known as:  TESSALON  Take 100 mg by mouth 3 (three) times daily as needed for cough.     budesonide-formoterol 160-4.5 MCG/ACT inhaler  Commonly known as:  SYMBICORT  Inhale 2 puffs into the lungs 2 (two) times daily.     diphenhydrAMINE 25 MG tablet  Commonly known as:  BENADRYL  Take 50 mg by mouth at bedtime as needed for sleep.      feeding supplement (ENSURE ENLIVE) Liqd  Take 237 mLs by mouth 3 (three) times daily between meals.     finasteride 5 MG tablet  Commonly known as:  PROSCAR  Take 5 mg by mouth daily.     flunisolide 25 MCG/ACT (0.025%) Soln  Commonly known as:  NASALIDE  Place 2 sprays into the nose 2 (two) times daily as needed (nasal congestion).     guaiFENesin 600 MG 12 hr tablet  Commonly known as:  MUCINEX  Take by mouth 2 (two) times daily as needed for cough or to loosen phlegm.     lisinopril-hydrochlorothiazide 10-12.5 MG tablet  Commonly known as:  PRINZIDE,ZESTORETIC  Take 1 tablet by mouth daily.     multivitamin with minerals Tabs tablet  Take 1 tablet by mouth daily.     Nebulizer Devi  1 Device by Does not apply route as needed.     omeprazole 20 MG capsule  Commonly known as:  PRILOSEC  Take 20 mg by mouth 2 (two) times daily as needed (for acid reflux).     predniSONE 10 MG tablet  Commonly known as:  DELTASONE  Take 1 tablet (10 mg total) by mouth daily with breakfast.  Start taking on:  12/23/2015     terazosin 5 MG capsule  Commonly known as:  HYTRIN  Take 5 mg by mouth at bedtime.       No Known Allergies    The results of significant diagnostics from this hospitalization (including imaging, microbiology, ancillary and laboratory) are listed below for reference.    Significant Diagnostic Studies: Dg Chest 2 View  12/19/2015  CLINICAL DATA:  Cough for 2 months EXAM: CHEST  2 VIEW COMPARISON:  11/26/2015 FINDINGS: The heart size and vascular pattern are normal. There is a nodular opacity over the left lower lobe and 1 over the right lower lobe in similar position. These are felt to represent nipple shadows. There is mild opacity in the lingula. There is an element of hyperinflation suggesting COPD. There is also mild perihilar bronchial wall thickening which is stable. IMPRESSION: COPD with mild bronchitic change. Possible lingular infiltrate which could represent  pneumonia. Followup PA and lateral chest X-ray is recommended in 3-4 weeks following trial of antibiotic therapy to ensure resolution and exclude underlying malignancy. Nodular opacities likely nipple shadows given the bilateral symmetry. This could be confirmed with nipple markers on a repeat PA radiograph. Electronically Signed   By: Skipper Cliche M.D.   On: 12/19/2015 20:48   Dg Chest Port 1 View  11/26/2015  CLINICAL DATA:  Acute onset of shortness of breath and cough. Initial encounter. EXAM: PORTABLE CHEST 1 VIEW COMPARISON:  Chest radiograph performed 10/26/2015 FINDINGS: The lungs are well-aerated and clear. There  is no evidence of focal opacification, pleural effusion or pneumothorax. Bilateral nipple shadows are noted. The heart is normal in size; the mediastinal contour is within normal limits. No acute osseous abnormalities are seen. IMPRESSION: No acute cardiopulmonary process seen. Electronically Signed   By: Garald Balding M.D.   On: 11/26/2015 21:06    Microbiology: Recent Results (from the past 240 hour(s))  Blood Culture (routine x 2)     Status: None (Preliminary result)   Collection Time: 12/19/15  9:57 PM  Result Value Ref Range Status   Specimen Description BLOOD BLOOD RIGHT FOREARM  Final   Special Requests BOTTLES DRAWN AEROBIC AND ANAEROBIC 5 CC  Final   Culture   Final    NO GROWTH 1 DAY Performed at [facility]    Report Status PENDING  Incomplete  Blood Culture (routine x 2)     Status: None (Preliminary result)   Collection Time: 12/19/15  9:57 PM  Result Value Ref Range Status   Specimen Description BLOOD RIGHT HAND  Final   Special Requests BOTTLES DRAWN AEROBIC AND ANAEROBIC 5 CC  Final   Culture   Final    NO GROWTH 1 DAY Performed at [facility]    Report Status PENDING  Incomplete  Urine culture     Status: None   Collection Time: 12/20/15  1:04 AM  Result Value Ref Range Status   Specimen Description URINE, CLEAN CATCH  Final    Special Requests NONE  Final   Culture   Final    NO GROWTH 1 DAY Performed at [facility]    Report Status 12/21/2015 FINAL  Final  Respiratory virus panel     Status: None   Collection Time: 12/20/15  1:50 AM  Result Value Ref Range Status   Respiratory Syncytial Virus A Negative Negative Final   Respiratory Syncytial Virus B Negative Negative Final   Influenza A Negative Negative Final   Influenza B Negative Negative Final   Parainfluenza 1 Negative Negative Final   Parainfluenza 2 Negative Negative Final   Parainfluenza 3 Negative Negative Final   Metapneumovirus Negative Negative Final   Rhinovirus Negative Negative Final   Adenovirus Negative Negative Final    Comment: (NOTE) Performed At: [facility] [address] [place], [place] [id number] Lindon Romp MD [id number]   Culture, sputum-assessment     Status: None   Collection Time: 12/21/15  6:54 AM  Result Value Ref Range Status   Specimen Description SPUTUM  Final   Special Requests NONE  Final   Sputum evaluation THIS SPECIMEN IS ACCEPTABLE FOR SPUTUM CULTURE  Final   Report Status 12/21/2015 FINAL  Final  MRSA PCR Screening     Status: None   Collection Time: 12/21/15  8:33 AM  Result Value Ref Range Status   MRSA by PCR NEGATIVE NEGATIVE Final    Comment:        The GeneXpert MRSA Assay (FDA approved for NASAL specimens only), is one component of a comprehensive MRSA colonization surveillance program. It is not intended to diagnose MRSA infection nor to guide or monitor treatment for MRSA infections.      Labs: Basic Metabolic Panel:  Recent Labs Lab 12/19/15 2126 12/20/15 0514 12/21/15 0603  NA 139  --  137  K 3.3*  --  4.2  CL 102  --  106  CO2 23  --  23  GLUCOSE 121*  --  147*  BUN 9  --  8  CREATININE 0.79  --  0.61  CALCIUM 9.2  --  9.1  MG  --  1.6*  --    Liver Function Tests: No results for input(s): AST, ALT, ALKPHOS, BILITOT, PROT, ALBUMIN in the last  168 hours. No results for input(s): LIPASE, AMYLASE in the last 168 hours. No results for input(s): AMMONIA in the last 168 hours. CBC:  Recent Labs Lab 12/19/15 2126  WBC 7.8  NEUTROABS 5.6  HGB 12.2*  HCT 35.4*  MCV 83.7  PLT 378   Cardiac Enzymes: No results for input(s): CKTOTAL, CKMB, CKMBINDEX, TROPONINI in the last 168 hours. BNP: BNP (last 3 results)  Recent Labs  04/02/15 0352 04/05/15 0725 07/18/15 0424  BNP 9.7 5.2 11.1    ProBNP (last 3 results) No results for input(s): PROBNP in the last 8760 hours.  CBG: No results for input(s): GLUCAP in the last 168 hours.     SignedDebbe Odea, MD [facility] 12/22/2015, 9:01 AM

## 2015-12-23 LAB — CULTURE, RESPIRATORY W GRAM STAIN: Culture: NORMAL

## 2015-12-23 LAB — CULTURE, RESPIRATORY: GRAM STAIN: NONE SEEN

## 2015-12-25 LAB — CULTURE, BLOOD (ROUTINE X 2)
CULTURE: NO GROWTH
Culture: NO GROWTH

## 2015-12-30 NOTE — Telephone Encounter (Signed)
Left message to call back  

## 2016-01-01 NOTE — Telephone Encounter (Signed)
Never received call back

## 2016-01-02 ENCOUNTER — Emergency Department (HOSPITAL_COMMUNITY): Payer: Non-veteran care

## 2016-01-02 ENCOUNTER — Inpatient Hospital Stay (HOSPITAL_COMMUNITY)
Admission: EM | Admit: 2016-01-02 | Discharge: 2016-01-05 | DRG: 190 | Disposition: A | Discharge status: Home / Self Care | Payer: Non-veteran care | Attending: Internal Medicine | Admitting: Internal Medicine

## 2016-01-02 ENCOUNTER — Encounter (HOSPITAL_COMMUNITY): Payer: Self-pay

## 2016-01-02 DIAGNOSIS — Z8701 Personal history of pneumonia (recurrent): Secondary | ICD-10-CM

## 2016-01-02 DIAGNOSIS — I4581 Long QT syndrome: Secondary | ICD-10-CM | POA: Diagnosis present

## 2016-01-02 DIAGNOSIS — E46 Unspecified protein-calorie malnutrition: Secondary | ICD-10-CM | POA: Diagnosis not present

## 2016-01-02 DIAGNOSIS — D638 Anemia in other chronic diseases classified elsewhere: Secondary | ICD-10-CM | POA: Diagnosis present

## 2016-01-02 DIAGNOSIS — Y95 Nosocomial condition: Secondary | ICD-10-CM | POA: Diagnosis present

## 2016-01-02 DIAGNOSIS — R000 Tachycardia, unspecified: Secondary | ICD-10-CM | POA: Diagnosis present

## 2016-01-02 DIAGNOSIS — J189 Pneumonia, unspecified organism: Secondary | ICD-10-CM | POA: Diagnosis present

## 2016-01-02 DIAGNOSIS — R0902 Hypoxemia: Secondary | ICD-10-CM | POA: Diagnosis present

## 2016-01-02 DIAGNOSIS — I10 Essential (primary) hypertension: Secondary | ICD-10-CM | POA: Diagnosis present

## 2016-01-02 DIAGNOSIS — N400 Enlarged prostate without lower urinary tract symptoms: Secondary | ICD-10-CM | POA: Diagnosis present

## 2016-01-02 DIAGNOSIS — J441 Chronic obstructive pulmonary disease with (acute) exacerbation: Secondary | ICD-10-CM | POA: Diagnosis present

## 2016-01-02 DIAGNOSIS — J440 Chronic obstructive pulmonary disease with acute lower respiratory infection: Secondary | ICD-10-CM | POA: Diagnosis present

## 2016-01-02 DIAGNOSIS — Z825 Family history of asthma and other chronic lower respiratory diseases: Secondary | ICD-10-CM | POA: Diagnosis not present

## 2016-01-02 DIAGNOSIS — Z6821 Body mass index (BMI) 21.0-21.9, adult: Secondary | ICD-10-CM

## 2016-01-02 DIAGNOSIS — Z79899 Other long term (current) drug therapy: Secondary | ICD-10-CM | POA: Diagnosis not present

## 2016-01-02 DIAGNOSIS — Z800 Family history of malignant neoplasm of digestive organs: Secondary | ICD-10-CM | POA: Diagnosis not present

## 2016-01-02 DIAGNOSIS — E876 Hypokalemia: Secondary | ICD-10-CM | POA: Diagnosis present

## 2016-01-02 DIAGNOSIS — E440 Moderate protein-calorie malnutrition: Secondary | ICD-10-CM | POA: Diagnosis present

## 2016-01-02 DIAGNOSIS — D721 Eosinophilia: Secondary | ICD-10-CM | POA: Diagnosis present

## 2016-01-02 DIAGNOSIS — R0602 Shortness of breath: Secondary | ICD-10-CM | POA: Diagnosis present

## 2016-01-02 LAB — BASIC METABOLIC PANEL
Anion gap: 7 (ref 5–15)
Anion gap: 10 (ref 5–15)
BUN: 7 mg/dL (ref 6–20)
BUN: 10 mg/dL (ref 6–20)
CHLORIDE: 110 mmol/L (ref 101–111)
CO2: 26 mmol/L (ref 22–32)
CO2: 23 mmol/L (ref 22–32)
CREATININE: 0.71 mg/dL (ref 0.61–1.24)
Calcium: 8.8 mg/dL — ABNORMAL LOW (ref 8.9–10.3)
Calcium: 9 mg/dL (ref 8.9–10.3)
Chloride: 108 mmol/L (ref 101–111)
Creatinine, Ser: 0.67 mg/dL (ref 0.61–1.24)
GFR calc Af Amer: >60 mL/min (ref >60)
GFR calc non Af Amer: >60 mL/min (ref >60)
Glucose, Bld: 101 mg/dL — ABNORMAL HIGH (ref 65–99)
Glucose, Bld: 238 mg/dL — ABNORMAL HIGH (ref 65–99)
POTASSIUM: 3.2 mmol/L — AB (ref 3.5–5.1)
Potassium: 4.2 mmol/L (ref 3.5–5.1)
SODIUM: 143 mmol/L (ref 135–145)
SODIUM: 141 mmol/L (ref 135–145)

## 2016-01-02 LAB — CBC WITH DIFFERENTIAL/PLATELET
Basophils Absolute: 0.1 10*3/uL (ref 0.0–0.1)
Basophils Relative: 1 %
EOS ABS: 1.8 10*3/uL — AB (ref 0.0–0.7)
EOS PCT: 24 %
HEMATOCRIT: 36.3 % — AB (ref 39.0–52.0)
HEMOGLOBIN: 12.3 g/dL — AB (ref 13.0–17.0)
LYMPHS PCT: 36 %
Lymphs Abs: 2.7 10*3/uL (ref 0.7–4.0)
MCH: 28.5 pg (ref 26.0–34.0)
MCHC: 33.9 g/dL (ref 30.0–36.0)
MCV: 84.2 fL (ref 78.0–100.0)
MONO ABS: 0.6 10*3/uL (ref 0.1–1.0)
Monocytes Relative: 8 %
NEUTROS PCT: 31 %
Neutro Abs: 2.3 10*3/uL (ref 1.7–7.7)
Platelets: 344 10*3/uL (ref 150–400)
RBC: 4.31 MIL/uL (ref 4.22–5.81)
RDW: 15.6 % — ABNORMAL HIGH (ref 11.5–15.5)
WBC: 7.5 10*3/uL (ref 4.0–10.5)

## 2016-01-02 LAB — CBC
HCT: 37.3 % — ABNORMAL LOW (ref 39.0–52.0)
HEMOGLOBIN: 12.7 g/dL — AB (ref 13.0–17.0)
MCH: 28.7 pg (ref 26.0–34.0)
MCHC: 34 g/dL (ref 30.0–36.0)
MCV: 84.4 fL (ref 78.0–100.0)
PLATELETS: 350 10*3/uL (ref 150–400)
RBC: 4.42 MIL/uL (ref 4.22–5.81)
RDW: 15.7 % — ABNORMAL HIGH (ref 11.5–15.5)
WBC: 8 10*3/uL (ref 4.0–10.5)

## 2016-01-02 LAB — CREATININE, SERUM
CREATININE: 0.64 mg/dL (ref 0.61–1.24)
GFR calc non Af Amer: >60 mL/min (ref >60)

## 2016-01-02 MED ORDER — VANCOMYCIN HCL IN DEXTROSE 750-5 MG/150ML-% IV SOLN
750.0000 mg | Freq: Three times a day (TID) | INTRAVENOUS | Status: DC
  Administered 2016-01-02 – 2016-01-04 (×6): 750 mg via INTRAVENOUS
  Filled 2016-01-02 (×8): qty 150

## 2016-01-02 MED ORDER — DIPHENHYDRAMINE HCL 25 MG PO CAPS: 25.0000 mg | ORAL_CAPSULE | Freq: Every evening | ORAL | Status: DC | PRN

## 2016-01-02 MED ORDER — HYDROCHLOROTHIAZIDE 12.5 MG PO CAPS
12.5000 mg | ORAL_CAPSULE | Freq: Every day | ORAL | Status: DC
  Administered 2016-01-02 – 2016-01-05 (×4): 12.5 mg via ORAL
  Filled 2016-01-02 (×5): qty 1

## 2016-01-02 MED ORDER — CHLORHEXIDINE GLUCONATE 0.12 % MT SOLN
15.0000 mL | Freq: Two times a day (BID) | OROMUCOSAL | Status: DC
  Administered 2016-01-02 – 2016-01-05 (×7): 15 mL via OROMUCOSAL
  Filled 2016-01-02 (×8): qty 15

## 2016-01-02 MED ORDER — BENZONATATE 100 MG PO CAPS
100.0000 mg | ORAL_CAPSULE | Freq: Three times a day (TID) | ORAL | Status: DC | PRN
  Administered 2016-01-02 – 2016-01-04 (×3): 100 mg via ORAL
  Filled 2016-01-02 (×4): qty 1

## 2016-01-02 MED ORDER — IPRATROPIUM-ALBUTEROL 0.5-2.5 (3) MG/3ML IN SOLN
3.0000 mL | Freq: Once | RESPIRATORY_TRACT | Status: AC
  Administered 2016-01-02: 3 mL via RESPIRATORY_TRACT
  Filled 2016-01-02: qty 3

## 2016-01-02 MED ORDER — IPRATROPIUM-ALBUTEROL 0.5-2.5 (3) MG/3ML IN SOLN
3.0000 mL | Freq: Four times a day (QID) | RESPIRATORY_TRACT | Status: DC
Start: 2016-01-02 — End: 2016-01-03
  Administered 2016-01-02 – 2016-01-03 (×3): 3 mL via RESPIRATORY_TRACT
  Filled 2016-01-02 (×2): qty 3

## 2016-01-02 MED ORDER — ADULT MULTIVITAMIN W/MINERALS CH
1.0000 | ORAL_TABLET | Freq: Every day | ORAL | Status: DC
  Administered 2016-01-02 – 2016-01-05 (×4): 1 via ORAL
  Filled 2016-01-02 (×4): qty 1

## 2016-01-02 MED ORDER — IPRATROPIUM-ALBUTEROL 0.5-2.5 (3) MG/3ML IN SOLN
3.0000 mL | Freq: Four times a day (QID) | RESPIRATORY_TRACT | Status: DC | PRN
  Filled 2016-01-02: qty 3

## 2016-01-02 MED ORDER — POTASSIUM CHLORIDE CRYS ER 20 MEQ PO TBCR
40.0000 meq | EXTENDED_RELEASE_TABLET | Freq: Once | ORAL | Status: AC
  Administered 2016-01-02: 40 meq via ORAL
  Filled 2016-01-02: qty 2

## 2016-01-02 MED ORDER — GUAIFENESIN ER 600 MG PO TB12: 600.0000 mg | ORAL_TABLET | Freq: Two times a day (BID) | ORAL | Status: DC | PRN

## 2016-01-02 MED ORDER — METHYLPREDNISOLONE SODIUM SUCC 125 MG IJ SOLR: 125.0000 mg | Freq: Once | INTRAMUSCULAR | Status: DC

## 2016-01-02 MED ORDER — FLUTICASONE PROPIONATE 50 MCG/ACT NA SUSP
2.0000 | Freq: Every day | NASAL | Status: DC
  Administered 2016-01-02 – 2016-01-05 (×4): 2 via NASAL
  Filled 2016-01-02: qty 16

## 2016-01-02 MED ORDER — LISINOPRIL 10 MG PO TABS
10.0000 mg | ORAL_TABLET | Freq: Every day | ORAL | Status: DC
  Administered 2016-01-02 – 2016-01-05 (×4): 10 mg via ORAL
  Filled 2016-01-02 (×5): qty 1

## 2016-01-02 MED ORDER — DEXTROSE 5 % IV SOLN
1.0000 g | Freq: Three times a day (TID) | INTRAVENOUS | Status: DC
  Administered 2016-01-02 – 2016-01-05 (×7): 1 g via INTRAVENOUS
  Filled 2016-01-02 (×9): qty 1

## 2016-01-02 MED ORDER — TERAZOSIN HCL 5 MG PO CAPS
5.0000 mg | ORAL_CAPSULE | Freq: Every day | ORAL | Status: DC
  Administered 2016-01-02 – 2016-01-04 (×3): 5 mg via ORAL
  Filled 2016-01-02 (×4): qty 1

## 2016-01-02 MED ORDER — VANCOMYCIN HCL IN DEXTROSE 1-5 GM/200ML-% IV SOLN
1000.0000 mg | Freq: Once | INTRAVENOUS | Status: AC
  Administered 2016-01-02: 1000 mg via INTRAVENOUS
  Filled 2016-01-02: qty 200

## 2016-01-02 MED ORDER — SODIUM CHLORIDE 0.9 % IV SOLN
INTRAVENOUS | Status: AC
  Administered 2016-01-02: 12:00:00 via INTRAVENOUS

## 2016-01-02 MED ORDER — LISINOPRIL-HYDROCHLOROTHIAZIDE 10-12.5 MG PO TABS: 1.0000 | ORAL_TABLET | Freq: Every day | ORAL | Status: DC

## 2016-01-02 MED ORDER — ACETAMINOPHEN 325 MG PO TABS
650.0000 mg | ORAL_TABLET | Freq: Four times a day (QID) | ORAL | Status: DC | PRN
  Administered 2016-01-03 – 2016-01-05 (×2): 650 mg via ORAL
  Filled 2016-01-02 (×2): qty 2

## 2016-01-02 MED ORDER — ENSURE ENLIVE PO LIQD
237.0000 mL | Freq: Three times a day (TID) | ORAL | Status: DC
  Administered 2016-01-02 – 2016-01-03 (×4): 237 mL via ORAL
  Filled 2016-01-02: qty 237

## 2016-01-02 MED ORDER — GUAIFENESIN-CODEINE 100-10 MG/5ML PO SOLN
5.0000 mL | Freq: Four times a day (QID) | ORAL | Status: DC | PRN
  Administered 2016-01-02 – 2016-01-04 (×3): 5 mL via ORAL
  Filled 2016-01-02 (×3): qty 5

## 2016-01-02 MED ORDER — DIPHENHYDRAMINE HCL 25 MG PO CAPS
25.0000 mg | ORAL_CAPSULE | Freq: Once | ORAL | Status: AC
  Administered 2016-01-02: 25 mg via ORAL
  Filled 2016-01-02: qty 1

## 2016-01-02 MED ORDER — FINASTERIDE 5 MG PO TABS
5.0000 mg | ORAL_TABLET | Freq: Every day | ORAL | Status: DC
  Administered 2016-01-02 – 2016-01-05 (×4): 5 mg via ORAL
  Filled 2016-01-02 (×4): qty 1

## 2016-01-02 MED ORDER — PANTOPRAZOLE SODIUM 40 MG PO TBEC
40.0000 mg | DELAYED_RELEASE_TABLET | Freq: Every day | ORAL | Status: DC
  Administered 2016-01-02 – 2016-01-05 (×4): 40 mg via ORAL
  Filled 2016-01-02 (×4): qty 1

## 2016-01-02 MED ORDER — MOMETASONE FURO-FORMOTEROL FUM 200-5 MCG/ACT IN AERO
2.0000 | INHALATION_SPRAY | Freq: Two times a day (BID) | RESPIRATORY_TRACT | Status: DC
  Administered 2016-01-02 – 2016-01-05 (×6): 2 via RESPIRATORY_TRACT
  Filled 2016-01-02: qty 8.8

## 2016-01-02 MED ORDER — DEXTROSE 5 % IV SOLN
2.0000 g | Freq: Once | INTRAVENOUS | Status: AC
  Administered 2016-01-02: 2 g via INTRAVENOUS
  Filled 2016-01-02: qty 2

## 2016-01-02 MED ORDER — PREDNISONE 50 MG PO TABS
60.0000 mg | ORAL_TABLET | Freq: Every day | ORAL | Status: DC
  Administered 2016-01-03 – 2016-01-05 (×3): 60 mg via ORAL
  Filled 2016-01-02 (×4): qty 1

## 2016-01-02 MED ORDER — ENOXAPARIN SODIUM 40 MG/0.4ML ~~LOC~~ SOLN
40.0000 mg | SUBCUTANEOUS | Status: DC
  Administered 2016-01-02 – 2016-01-04 (×3): 40 mg via SUBCUTANEOUS
  Filled 2016-01-02 (×4): qty 0.4

## 2016-01-02 NOTE — Progress Notes (Addendum)
Pharmacy Antibiotic Note  [name] [name] is a 65 y.o. male admitted on 01/02/2016 with suspected HCAP.  Presented for AECOPD after worsening symptoms over past 24 hours and new RML infiltrate noted on CXR.  Recently admitted 4/6-4/9 for pneumonia; treated with Vanc/Zosyn >> Augmentin.  Pharmacy has been consulted to dose vancomycin; cefepime ordered x 1  Plan:  Vancomycin 1000 mg IV now, then 750 mg IV q8 hr; goal trough 15-20 mcg/mL  Measure vancomycin trough levels at steady state as indicated  Await orders for cefepime; recommend 1 g IV q8 hr  Follow clinical course, renal function, culture results as available  Follow for de-escalation of antibiotics and LOT     Temp (24hrs), Avg:98.1 F (36.7 C), Min:98.1 F (36.7 C), Max:98.1 F (36.7 C)   Recent Labs Lab 01/02/16 0747  WBC 7.5  CREATININE 0.67    Estimated Creatinine Clearance: 80.5 mL/min (by C-G formula based on Cr of 0.67).    No Known Allergies  Antimicrobials this admission: Vancomycin 4/20 >>  Cefepime 4/20 >>   Dose adjustments this admission: ---  Microbiology results: (2 hr Hx negative in Epic) No cultures ordered  Thank you for allowing pharmacy to be a part of this patient's care.  Reuel Boom, PharmD, BCPS Pager: [phone] 01/02/2016, 8:50 AM

## 2016-01-02 NOTE — ED Provider Notes (Signed)
CSN: [id number]     Arrival date & time 01/02/16  0725 History   First MD Initiated Contact with Patient 01/02/16 [phone]     Chief Complaint  Patient presents with  . Shortness of Breath  . Cough     (Consider location/radiation/quality/duration/timing/severity/associated sxs/prior Treatment) HPI Comments: Patient presents to the emergency department with chief complaint of COPD exacerbation. He states that he has a history of COPD and began wheezing yesterday. He states that it worsened throughout the night and into this morning. He reports associated chest tightness, shortness of breath, and productive cough. States that he was recently admitted for pneumonia. States that he had been feeling better until yesterday. He denies any chest pain or fever. His symptoms are relieved with inhalers. There are no other associated symptoms.  The history is provided by the patient. No language interpreter was used.    Past Medical History  Diagnosis Date  . HTN (hypertension)   . Bradycardia, sinus 12/24/11  . Syncope and collapse 12/24/11    2/2 hypotension in the setting of dehydration  . Hypotension 12/24/11    in the setting of dehydration   . Abnormal EKG 12/24/11    anteroseptal and lateral ST elevation, felt r/t early repolarization;  Cardiac cath 12/24/11 - normal coronary anatomy, EF 55-65%  . Marijuana use   . History of alcohol abuse     hospitalized for detox 2002  . COPD (chronic obstructive pulmonary disease) [facility])    Past Surgical History  Procedure Laterality Date  . Circumcision  1972  . Laceration repair  02/2004    arthroscopic debridement of triagular fibrocartilage tear/E-chart; right wrist  . Cardiac catheterization  12/24/11    normal coronary anatomy, EF 55-65%  . Left heart catheterization with coronary angiogram N/A 12/24/2011    Procedure: LEFT HEART CATHETERIZATION WITH CORONARY ANGIOGRAM;  Surgeon: Peter M Martinique, MD;  Location: [facility] CATH LAB;  Service: Cardiovascular;   Laterality: N/A;  . Peg placement N/A 10/09/2015    Procedure: PERCUTANEOUS ENDOSCOPIC GASTROSTOMY (PEG) PLACEMENT;  Surgeon: Judeth Horn, MD;  Location: [place];  Service: General;  Laterality: N/A;  . Esophagogastroduodenoscopy N/A 10/09/2015    Procedure: ESOPHAGOGASTRODUODENOSCOPY (EGD);  Surgeon: Judeth Horn, MD;  Location: [facility] ENDOSCOPY;  Service: General;  Laterality: N/A;  . Percutaneous tracheostomy N/A 10/09/2015    Procedure: PERCUTANEOUS TRACHEOSTOMY;  Surgeon: Judeth Horn, MD;  Location: [facility] OR;  Service: General;  Laterality: N/A;   Family History  Problem Relation Age of Onset  . Liver cancer Sister    Social History  Substance Use Topics  . Smoking status: Former Research scientist (life sciences)  . Smokeless tobacco: None  . Alcohol Use: No     Comment: soc    Review of Systems  Constitutional: Negative for fever and chills.  Respiratory: Positive for cough, shortness of breath and wheezing.   Cardiovascular: Negative for chest pain.  Gastrointestinal: Negative for nausea, vomiting, diarrhea and constipation.  Genitourinary: Negative for dysuria.  All other systems reviewed and are negative.     Allergies  Review of patient's allergies indicates no known allergies.  Home Medications   Prior to Admission medications   Medication Sig Start Date End Date Taking? Authorizing Provider  albuterol (PROVENTIL HFA;VENTOLIN HFA) 108 (90 BASE) MCG/ACT inhaler Inhale 1-2 puffs into the lungs every 6 (six) hours as needed for wheezing or shortness of breath. 04/03/15   Donne Hazel, MD  albuterol (PROVENTIL) (2.5 MG/3ML) 0.083% nebulizer solution Take 3-6 mLs (2.5-5 mg total) by  nebulization every 4 (four) hours as needed for wheezing or shortness of breath. 03/12/15   Shanon Rosser, MD  ALPRAZolam Duanne Moron) 0.5 MG tablet Take 1 tablet (0.5 mg total) by mouth at bedtime as needed for anxiety or sleep. 11/26/15   Nat Christen, MD  amoxicillin-clavulanate (AUGMENTIN) 875-125 MG tablet Take 1 tablet by mouth  every 12 (twelve) hours. 12/22/15   Debbe Odea, MD  benzonatate (TESSALON) 100 MG capsule Take 100 mg by mouth 3 (three) times daily as needed for cough.    Historical Provider, MD  budesonide-formoterol (SYMBICORT) 160-4.5 MCG/ACT inhaler Inhale 2 puffs into the lungs 2 (two) times daily. 05/13/15   Francesca Oman, DO  diphenhydrAMINE (BENADRYL) 25 MG tablet Take 50 mg by mouth at bedtime as needed for sleep.    Historical Provider, MD  feeding supplement, ENSURE ENLIVE, (ENSURE ENLIVE) LIQD Take 237 mLs by mouth 3 (three) times daily between meals. 11/07/15   Lisette Abu, PA-C  finasteride (PROSCAR) 5 MG tablet Take 5 mg by mouth daily.     Historical Provider, MD  flunisolide (NASALIDE) 25 MCG/ACT (0.025%) SOLN Place 2 sprays into the nose 2 (two) times daily as needed (nasal congestion).    Historical Provider, MD  guaiFENesin (MUCINEX) 600 MG 12 hr tablet Take by mouth 2 (two) times daily as needed for cough or to loosen phlegm.    Historical Provider, MD  lisinopril-hydrochlorothiazide (PRINZIDE,ZESTORETIC) 10-12.5 MG per tablet Take 1 tablet by mouth daily.    Historical Provider, MD  Multiple Vitamin (MULTIVITAMIN WITH MINERALS) TABS tablet Take 1 tablet by mouth daily.    Historical Provider, MD  omeprazole (PRILOSEC) 20 MG capsule Take 20 mg by mouth 2 (two) times daily as needed (for acid reflux).     Historical Provider, MD  predniSONE (DELTASONE) 10 MG tablet Take 1 tablet (10 mg total) by mouth daily with breakfast. 12/23/15   Debbe Odea, MD  Respiratory Therapy Supplies (NEBULIZER) DEVI 1 Device by Does not apply route as needed. 04/03/15   Donne Hazel, MD  terazosin (HYTRIN) 5 MG capsule Take 5 mg by mouth at bedtime.    Historical Provider, MD   SpO2 99% Physical Exam  Constitutional: He is oriented to person, place, and time. He appears well-developed and well-nourished.  HENT:  Head: Normocephalic and atraumatic.  Eyes: Conjunctivae and EOM are normal. Pupils are equal,  round, and reactive to light. Right eye exhibits no discharge. Left eye exhibits no discharge. No scleral icterus.  Neck: Normal range of motion. Neck supple. No JVD present.  Cardiovascular: Normal rate, regular rhythm and normal heart sounds.  Exam reveals no gallop and no friction rub.   No murmur heard. Pulmonary/Chest: No respiratory distress. He has wheezes. He has no rales. He exhibits no tenderness.  Bilateral inspiratory and expiratory wheezes, mild increased work of breathing, able to speak in 3-5 word sentences  Abdominal: Soft. He exhibits no distension and no mass. There is no tenderness. There is no rebound and no guarding.  Musculoskeletal: Normal range of motion. He exhibits no edema or tenderness.  Neurological: He is alert and oriented to person, place, and time.  Skin: Skin is warm and dry.  Psychiatric: He has a normal mood and affect. His behavior is normal. Judgment and thought content normal.  Nursing note and vitals reviewed.   ED Course  Procedures (including critical care time) Labs Review Labs Reviewed  CBC WITH DIFFERENTIAL/PLATELET - Abnormal; Notable for the following:    Hemoglobin  12.3 (*)    HCT 36.3 (*)    RDW 15.6 (*)    Eosinophils Absolute 1.8 (*)    All other components within normal limits  BASIC METABOLIC PANEL - Abnormal; Notable for the following:    Potassium 3.2 (*)    Glucose, Bld 101 (*)    Calcium 8.8 (*)    All other components within normal limits    Imaging Review Dg Chest 2 View  01/02/2016  CLINICAL DATA:  Shortness of breath, cough. EXAM: CHEST  2 VIEW COMPARISON:  December 19, 2015. FINDINGS: The heart size and mediastinal contours are within normal limits. No pneumothorax or pleural effusion is noted. Right middle lobe opacity is noted consistent with pneumonia or atelectasis. Left lung is clear. Old left rib fractures are noted. IMPRESSION: Right middle lobe pneumonia or atelectasis. Follow-up radiographs are recommended to ensure  resolution and rule out underlying neoplasm. Electronically Signed   By: Marijo Conception, M.D.   On: 01/02/2016 08:14   I have personally reviewed and evaluated these images and lab results as part of my medical decision-making.    MDM   Final diagnoses:  HCAP (healthcare-associated pneumonia)    Patient presents wheezing. History of COPD. Recent diagnosis for HCAP.  O2 sat in the upper 80s per EMS.  Currently on breathing treatment and O2 sat is normal, but with diffuse wheezes.  Will check CXR given recent pneumonia and get labs to prep for possible admission.  Chest x-ray shows right-sided pneumonia today. When he was admitted 2 weeks ago, it was for left-sided pneumonia. The left chest is clear on chest x-ray today. Patient feels much better after breathing treatments, but remained slightly hypoxic at 89-90%. Patient seems discussed with Dr. Oleta Mouse, who agrees with plan for admission for HCAP given recent admission and new right sided pneumonia.  Appreciate Dr. Daryll Drown for admitting the patient.    Montine Circle, PA-C 01/02/16 0900  Forde Dandy, MD 01/03/16 [phone]

## 2016-01-02 NOTE — ED Notes (Signed)
Patient transported to X-ray 

## 2016-01-02 NOTE — ED Notes (Addendum)
Pt's O2 sat decreased to 88% RA.  Pt placed on 2L [place].

## 2016-01-02 NOTE — ED Provider Notes (Signed)
Medical screening examination/treatment/procedure(s) were conducted as a shared visit with non-physician practitioner(s) and myself.  I personally evaluated the patient during the encounter.   EKG Interpretation None      65 year old male with history of HTN and COPD who presents with dyspnea. Recently discharged 12/22/2015 for COPD exacerbation and HCAP. Discharged with Augmentin which he finished 1 week ago and states felt back at baseline upon discharge. Over past 1-2 days with increased cough and sputum production and sob. Used inhaler and breathing treatments 6-8 times yesterday without relief. No fever, chest pain, LE edema, orthopnea. Called EMS this morning. Given solumedrol, albuterol and atrovent en route. Initially with tachypnea, hypoxia to 88% on RA, and diffuse expiratory wheezes with poor air movement. Given additional treatment with improved symptoms and improved air movement and scattered wheezing. Pulse ox 89%. CXr with c/f new infiltrate in RML (lingular PnA on prior hospitalization). C/f HCAP. Given continued requirement for breathing treatments and PNA will treat as HCAP and admit.  Forde Dandy, MD 01/02/16 [phone]

## 2016-01-02 NOTE — ED Notes (Signed)
Pt's O2 Sat dropped to 88% on RA.

## 2016-01-02 NOTE — ED Notes (Signed)
Bed: [id number] Expected date:  Expected time:  Means of arrival:  Comments: EMS 65 yo male resp distress, COPD, pneumonia, sat upper 80's, cough, duoneb 144/96

## 2016-01-02 NOTE — Progress Notes (Addendum)
65 yr old male with with last admission on 12/19/15-12/22/15 for HCAP Pt confirms on d/c he was able to get medications at home without issues and was compliant with medications Pt reports being seen by [facility] [place] Dr Meyer Russel (hillande #1) on the Tuesday after d/c 12/24/15 Pt did not need Home health after d/c  Pt has a Rolling walker (borrowed), bedside commode (purchased), cane (purchased) States requested these items from [place] but without success Pt inquired about Medial POA process Cm discussed what would be needed and how the process would take place Pt has POA forms but has not completed Had questions about forms   SW consult for advance directives entered

## 2016-01-02 NOTE — ED Notes (Addendum)
Per EMS, Pt, from home, c/o SOB, chest tightness, and productive cough x 1 day.  Pain score 3/10.  Pt reports white sputum.  Hx of COPD.  Recent PNA Dx.  10mg  Albuterol, 1mg  Atrovent, and 125mg  Solumedrol given en route.
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
## 2016-01-03 LAB — CBC WITH DIFFERENTIAL/PLATELET
BASOS PCT: 0 %
Basophils Absolute: 0 10*3/uL (ref 0.0–0.1)
EOS ABS: 0.2 10*3/uL (ref 0.0–0.7)
EOS PCT: 2 %
HCT: 33.3 % — ABNORMAL LOW (ref 39.0–52.0)
Hemoglobin: 11.6 g/dL — ABNORMAL LOW (ref 13.0–17.0)
LYMPHS ABS: 2.7 10*3/uL (ref 0.7–4.0)
Lymphocytes Relative: 26 %
MCH: 29.4 pg (ref 26.0–34.0)
MCHC: 34.8 g/dL (ref 30.0–36.0)
MCV: 84.3 fL (ref 78.0–100.0)
MONO ABS: 0.9 10*3/uL (ref 0.1–1.0)
Monocytes Relative: 9 %
Neutro Abs: 6.7 10*3/uL (ref 1.7–7.7)
Neutrophils Relative %: 63 %
PLATELETS: 329 10*3/uL (ref 150–400)
RBC: 3.95 MIL/uL — ABNORMAL LOW (ref 4.22–5.81)
RDW: 15.9 % — AB (ref 11.5–15.5)
WBC: 10.5 10*3/uL (ref 4.0–10.5)

## 2016-01-03 LAB — HIV ANTIBODY (ROUTINE TESTING W REFLEX): HIV SCREEN 4TH GENERATION: NONREACTIVE

## 2016-01-03 LAB — EXPECTORATED SPUTUM ASSESSMENT W GRAM STAIN, RFLX TO RESP C

## 2016-01-03 LAB — COMPREHENSIVE METABOLIC PANEL
ALT: 11 U/L — ABNORMAL LOW (ref 17–63)
AST: 12 U/L — ABNORMAL LOW (ref 15–41)
Albumin: 3.1 g/dL — ABNORMAL LOW (ref 3.5–5.0)
Alkaline Phosphatase: 83 U/L (ref 38–126)
Anion gap: 6 (ref 5–15)
BUN: 8 mg/dL (ref 6–20)
CO2: 26 mmol/L (ref 22–32)
Calcium: 9 mg/dL (ref 8.9–10.3)
Chloride: 107 mmol/L (ref 101–111)
Creatinine, Ser: 0.59 mg/dL — ABNORMAL LOW (ref 0.61–1.24)
GFR calc Af Amer: >60 mL/min (ref >60)
GFR calc non Af Amer: >60 mL/min (ref >60)
Glucose, Bld: 111 mg/dL — ABNORMAL HIGH (ref 65–99)
Potassium: 4.4 mmol/L (ref 3.5–5.1)
Sodium: 139 mmol/L (ref 135–145)
Total Bilirubin: 0.5 mg/dL (ref 0.3–1.2)
Total Protein: 6.5 g/dL (ref 6.5–8.1)

## 2016-01-03 LAB — IRON AND TIBC
IRON: 45 ug/dL (ref 45–182)
Saturation Ratios: 24 % (ref 17.9–39.5)
TIBC: 185 ug/dL — AB (ref 250–450)
UIBC: 140 ug/dL

## 2016-01-03 LAB — MPO/PR-3 (ANCA) ANTIBODIES
ANCA Proteinase 3: <3.5 U/mL (ref 0.0–3.5)
Myeloperoxidase Abs: <9 U/mL (ref 0.0–9.0)

## 2016-01-03 LAB — ANCA TITERS
Atypical P-ANCA titer: <1:20 {titer}
P-ANCA: <1:20 {titer}

## 2016-01-03 LAB — EXPECTORATED SPUTUM ASSESSMENT W REFEX TO RESP CULTURE

## 2016-01-03 LAB — FERRITIN: Ferritin: 155 ng/mL (ref 24–336)

## 2016-01-03 LAB — IGE: IgE (Immunoglobulin E), Serum: 1245 IU/mL — ABNORMAL HIGH (ref 0–100)

## 2016-01-03 LAB — STREP PNEUMONIAE URINARY ANTIGEN: Strep Pneumo Urinary Antigen: NEGATIVE

## 2016-01-03 LAB — PREALBUMIN: PREALBUMIN: 18.3 mg/dL (ref 18–38)

## 2016-01-03 MED ORDER — ZOLPIDEM TARTRATE 5 MG PO TABS
5.0000 mg | ORAL_TABLET | Freq: Every evening | ORAL | Status: DC | PRN
  Administered 2016-01-03 – 2016-01-04 (×2): 5 mg via ORAL
  Filled 2016-01-03 (×2): qty 1

## 2016-01-03 MED ORDER — IPRATROPIUM-ALBUTEROL 0.5-2.5 (3) MG/3ML IN SOLN
3.0000 mL | Freq: Three times a day (TID) | RESPIRATORY_TRACT | Status: DC
  Administered 2016-01-03 – 2016-01-05 (×7): 3 mL via RESPIRATORY_TRACT
  Filled 2016-01-03 (×7): qty 3

## 2016-01-03 MED ORDER — BOOST / RESOURCE BREEZE PO LIQD
1.0000 | Freq: Three times a day (TID) | ORAL | Status: DC
  Administered 2016-01-03 – 2016-01-05 (×7): 1 via ORAL

## 2016-01-03 NOTE — Progress Notes (Signed)
Initial Nutrition Assessment  DOCUMENTATION CODES:   Non-severe (moderate) malnutrition in context of chronic illness  INTERVENTION:   Boost Breeze po TID, each supplement provides 250 kcal and 9 grams of protein  NUTRITION DIAGNOSIS:   Increased nutrient needs related to chronic illness as evidenced by estimated needs  GOAL:   Patient will meet greater than or equal to 90% of their needs  MONITOR:   PO intake, Supplement acceptance, Labs, Weight trends, I & O's  REASON FOR ASSESSMENT:   Consult Assessment of nutrition requirement/status  ASSESSMENT:   65 y.o. Male with medical history significant of HTN, COPD, BPH who presents for worsening SOB. He was recently admitted to the hospital earlier this month on 4/6 for pneumonia. He was treated for sepsis and HCAP with vancomycin and Zosyn initially and this was quickly weaned to Zosyn only and then amoxicillin. It appears he was discharged on 4/8, but unfortunately, there is not a filed discharge summary at this time. He improved quickly and was feeling back to his normal self while on antibiotics. He finished his course of antibiotics 1 week ago. Yesterday his SOB returned with gasping for air, increased cough with white sputum and wheezing. He increased use of his breathing treatments to 6-8 times in the last day and also used his symbicort without improvement. He became concerned and called EMS. EMS found that his O2 saturation was in the mid 80s and gave him solumedrol and a breathing treatment and he improved. Further symptoms include chest soreness from coughing. He denies history of asthma or allergies.. He has not had PFTs in our system. He has been diagnosed with COPD by imaging, on CXR and CT scan over the last year. He has not had breathing problems before that. 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 He is now abstinent X 1.5 years. He has a family history of  COPD and respiratory failure in a sister, however, she smoked heavily.  Patient reports a good appetite PTA. States his weight has been stable. Feel he would benefit from oral nutrition supplements; he is malnourished. He doesn't care for Ensure and/or Boost; he describes it as a "laxative". Amenable to Boost Breeze >> RD to order.  Nutrition-Focused physical exam completed. Findings are moderate fat depletion, moderate muscle depletion, and no edema.   Diet Order:  Diet regular Room service appropriate?: Yes; Fluid consistency:: Thin  Skin:  Reviewed, no issues  Last BM:  N/A  Height:   Ht Readings from Last 1 Encounters:  01/03/16 5\' 6"  (1.676 m)    Weight:   Wt Readings from Last 1 Encounters:  01/03/16 132 lb 15 oz (60.3 kg)    Ideal Body Weight:  59 kg  BMI:  Body mass index is 21.47 kg/(m^2).  Estimated Nutritional Needs:   Kcal:  1800-2000  Protein:  90-100 gm  Fluid:  1.8-2.0 L  EDUCATION NEEDS:   No education needs identified at this time  Arthur Holms, RD, LDN Pager #: [phone] After-Hours Pager #: [phone]
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
## 2016-01-04 DIAGNOSIS — I10 Essential (primary) hypertension: Secondary | ICD-10-CM

## 2016-01-04 DIAGNOSIS — E440 Moderate protein-calorie malnutrition: Secondary | ICD-10-CM

## 2016-01-04 DIAGNOSIS — J189 Pneumonia, unspecified organism: Secondary | ICD-10-CM

## 2016-01-04 DIAGNOSIS — N400 Enlarged prostate without lower urinary tract symptoms: Secondary | ICD-10-CM

## 2016-01-04 DIAGNOSIS — J441 Chronic obstructive pulmonary disease with (acute) exacerbation: Secondary | ICD-10-CM

## 2016-01-04 LAB — CBC WITH DIFFERENTIAL/PLATELET
BASOS ABS: 0 10*3/uL (ref 0.0–0.1)
BASOS PCT: 0 %
EOS ABS: 0.2 10*3/uL (ref 0.0–0.7)
Eosinophils Relative: 2 %
HCT: 34 % — ABNORMAL LOW (ref 39.0–52.0)
HEMOGLOBIN: 11.5 g/dL — AB (ref 13.0–17.0)
LYMPHS ABS: 3.6 10*3/uL (ref 0.7–4.0)
LYMPHS PCT: 37 %
MCH: 29.2 pg (ref 26.0–34.0)
MCHC: 33.8 g/dL (ref 30.0–36.0)
MCV: 86.3 fL (ref 78.0–100.0)
MONOS PCT: 8 %
Monocytes Absolute: 0.8 10*3/uL (ref 0.1–1.0)
NEUTROS ABS: 5.1 10*3/uL (ref 1.7–7.7)
Neutrophils Relative %: 53 %
Platelets: 341 10*3/uL (ref 150–400)
RBC: 3.94 MIL/uL — ABNORMAL LOW (ref 4.22–5.81)
RDW: 16.2 % — ABNORMAL HIGH (ref 11.5–15.5)
WBC: 9.7 10*3/uL (ref 4.0–10.5)

## 2016-01-04 LAB — BASIC METABOLIC PANEL
Anion gap: 6 (ref 5–15)
BUN: 9 mg/dL (ref 6–20)
CALCIUM: 8.9 mg/dL (ref 8.9–10.3)
CO2: 24 mmol/L (ref 22–32)
Chloride: 109 mmol/L (ref 101–111)
Creatinine, Ser: 0.62 mg/dL (ref 0.61–1.24)
GFR calc Af Amer: >60 mL/min (ref >60)
GLUCOSE: 113 mg/dL — AB (ref 65–99)
Potassium: 3.5 mmol/L (ref 3.5–5.1)
Sodium: 139 mmol/L (ref 135–145)

## 2016-01-04 MED ORDER — LORATADINE 10 MG PO TABS
10.0000 mg | ORAL_TABLET | Freq: Every day | ORAL | Status: DC
  Administered 2016-01-04 – 2016-01-05 (×2): 10 mg via ORAL
  Filled 2016-01-04 (×2): qty 1

## 2016-01-04 MED ORDER — POTASSIUM CHLORIDE CRYS ER 20 MEQ PO TBCR
40.0000 meq | EXTENDED_RELEASE_TABLET | Freq: Once | ORAL | Status: AC
  Administered 2016-01-04: 40 meq via ORAL
  Filled 2016-01-04: qty 2
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
## 2016-01-05 DIAGNOSIS — E46 Unspecified protein-calorie malnutrition: Secondary | ICD-10-CM

## 2016-01-05 LAB — BASIC METABOLIC PANEL
Anion gap: 8 (ref 5–15)
BUN: 10 mg/dL (ref 6–20)
CALCIUM: 9.1 mg/dL (ref 8.9–10.3)
CO2: 26 mmol/L (ref 22–32)
Chloride: 107 mmol/L (ref 101–111)
Creatinine, Ser: 0.59 mg/dL — ABNORMAL LOW (ref 0.61–1.24)
GFR calc Af Amer: >60 mL/min (ref >60)
GLUCOSE: 87 mg/dL (ref 65–99)
Potassium: 3.8 mmol/L (ref 3.5–5.1)
Sodium: 141 mmol/L (ref 135–145)

## 2016-01-05 MED ORDER — IPRATROPIUM-ALBUTEROL 0.5-2.5 (3) MG/3ML IN SOLN: 3.0000 mL | Freq: Three times a day (TID) | RESPIRATORY_TRACT | Status: DC

## 2016-01-05 MED ORDER — PREDNISONE 10 MG PO TABS: 10.0000 mg | ORAL_TABLET | Freq: Every day | ORAL | Status: DC

## 2016-01-05 MED ORDER — LEVOFLOXACIN 750 MG PO TABS: 750.0000 mg | ORAL_TABLET | Freq: Every day | ORAL | Status: DC

## 2016-01-05 MED ORDER — LORATADINE 10 MG PO TABS: 10.0000 mg | ORAL_TABLET | Freq: Every day | ORAL | Status: DC

## 2016-01-05 MED ORDER — LEVOFLOXACIN 750 MG PO TABS
750.0000 mg | ORAL_TABLET | Freq: Every day | ORAL | Status: DC
  Administered 2016-01-05: 750 mg via ORAL
  Filled 2016-01-05: qty 1

## 2016-01-05 NOTE — Progress Notes (Signed)
Patient discharged.  Denies pain.  A&O x4.  Respirations even and unlabored.  Room air.  Leaving with three prescriptions.  Patient reports he has a nebulizer machine at home.  Patient reports understanding of discharge instructions.  Patient being picked up at discharge by family member.  Leaving with personal belongings.  No complaints.

## 2016-01-05 NOTE — Care Management Note (Addendum)
Case Management Note  Patient Details  Name: Daelon Viano MRN: [id number] Date of Birth: 1951/05/13  Subjective/Objective:         HCAP, COPD           Action/Plan: Discharge Planning: AVS reviewed: 01/04/2016 NCM spoke to pt and states he goes to the [facility]. Faxed H&P and notified [facility] that pt was in hospital. Spoke to [place], [place] [facility]. Pt has a nebulizer machine at home.     01/05/2016 Faxed dc summary to [facility].   Expected Discharge Date:  01/05/2016            Expected Discharge Plan:  Home/Self Care  In-House Referral:  NA  Discharge planning Services  NA  Post Acute Care Choice:  NA Choice offered to:  NA  DME Arranged:  N/A DME Agency:  NA  HH Arranged:  NA HH Agency:  NA  Status of Service:  Completed, signed off  Medicare Important Message Given:    Date Medicare IM Given:    Medicare IM give by:    Date Additional Medicare IM Given:    Additional Medicare Important Message give by:     If discussed at [place] of Stay Meetings, dates discussed:    Additional Comments:  Erenest Rasher, RN 01/05/2016, 3:32 PM
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
## 2016-01-07 LAB — CULTURE, BLOOD (ROUTINE X 2)
CULTURE: NO GROWTH
CULTURE: NO GROWTH

## 2016-02-05 ENCOUNTER — Emergency Department (HOSPITAL_COMMUNITY): Payer: Non-veteran care

## 2016-02-05 ENCOUNTER — Emergency Department (HOSPITAL_COMMUNITY)
Admission: EM | Admit: 2016-02-05 | Discharge: 2016-02-05 | Disposition: A | Discharge status: Home / Self Care | Payer: Non-veteran care | Attending: Emergency Medicine | Admitting: Emergency Medicine

## 2016-02-05 ENCOUNTER — Encounter (HOSPITAL_COMMUNITY): Payer: Self-pay

## 2016-02-05 DIAGNOSIS — Z79899 Other long term (current) drug therapy: Secondary | ICD-10-CM | POA: Diagnosis not present

## 2016-02-05 DIAGNOSIS — J449 Chronic obstructive pulmonary disease, unspecified: Secondary | ICD-10-CM

## 2016-02-05 DIAGNOSIS — I10 Essential (primary) hypertension: Secondary | ICD-10-CM | POA: Insufficient documentation

## 2016-02-05 DIAGNOSIS — J441 Chronic obstructive pulmonary disease with (acute) exacerbation: Secondary | ICD-10-CM | POA: Diagnosis not present

## 2016-02-05 DIAGNOSIS — R0602 Shortness of breath: Secondary | ICD-10-CM | POA: Diagnosis present

## 2016-02-05 LAB — BASIC METABOLIC PANEL
ANION GAP: 6 (ref 5–15)
BUN: 16 mg/dL (ref 6–20)
CO2: 25 mmol/L (ref 22–32)
CREATININE: 0.77 mg/dL (ref 0.61–1.24)
Calcium: 9.2 mg/dL (ref 8.9–10.3)
Chloride: 106 mmol/L (ref 101–111)
Glucose, Bld: 95 mg/dL (ref 65–99)
POTASSIUM: 3.6 mmol/L (ref 3.5–5.1)
SODIUM: 137 mmol/L (ref 135–145)

## 2016-02-05 LAB — CBC WITH DIFFERENTIAL/PLATELET
Basophils Absolute: 0 10*3/uL (ref 0.0–0.1)
Basophils Relative: 1 %
Eosinophils Absolute: 0.3 10*3/uL (ref 0.0–0.7)
Eosinophils Relative: 6 %
HCT: 40.2 % (ref 39.0–52.0)
Hemoglobin: 13.5 g/dL (ref 13.0–17.0)
Lymphocytes Relative: 34 %
Lymphs Abs: 1.9 10*3/uL (ref 0.7–4.0)
MCH: 29.6 pg (ref 26.0–34.0)
MCHC: 33.6 g/dL (ref 30.0–36.0)
MCV: 88.2 fL (ref 78.0–100.0)
Monocytes Absolute: 0.6 10*3/uL (ref 0.1–1.0)
Monocytes Relative: 10 %
Neutro Abs: 2.8 10*3/uL (ref 1.7–7.7)
Neutrophils Relative %: 49 %
Platelets: 262 10*3/uL (ref 150–400)
RBC: 4.56 MIL/uL (ref 4.22–5.81)
RDW: 17 % — ABNORMAL HIGH (ref 11.5–15.5)
WBC: 5.6 10*3/uL (ref 4.0–10.5)

## 2016-02-05 MED ORDER — DOXYCYCLINE HYCLATE 100 MG PO CAPS: 100.0000 mg | ORAL_CAPSULE | Freq: Two times a day (BID) | ORAL | Status: DC

## 2016-02-05 MED ORDER — PREDNISONE 20 MG PO TABS: 40.0000 mg | ORAL_TABLET | Freq: Every day | ORAL | Status: DC

## 2016-02-05 MED ORDER — ALBUTEROL SULFATE (2.5 MG/3ML) 0.083% IN NEBU
5.0000 mg | INHALATION_SOLUTION | Freq: Once | RESPIRATORY_TRACT | Status: AC
  Administered 2016-02-05: 5 mg via RESPIRATORY_TRACT
  Filled 2016-02-05: qty 6

## 2016-02-05 NOTE — ED Notes (Signed)
Patient c/o productive cough x several months. Patient states he has been coughing up white sputum. Patient has a history of COPD.

## 2016-02-05 NOTE — ED Notes (Signed)
Patient ambulated 70 feet with no assistance. Patient's O2 saturation was 96% at the start of the ambulation and stayed at 96%. Heart rate at the beginning of the ambulation was 92 bets per minute and at the end of the ambulation was at 103 bets per minute.

## 2016-02-05 NOTE — ED Provider Notes (Signed)
CSN: [id number]     Arrival date & time 02/05/16  1119 History   First MD Initiated Contact with Patient 02/05/16 1154     Chief Complaint  Patient presents with  . Shortness of Breath      Patient is a 65 y.o. male presenting with shortness of breath. The history is provided by the patient.  Shortness of Breath Associated symptoms: chest pain   Associated symptoms: no abdominal pain and no fever   Patient presents for shortness of breath and cough. Has history of COPD and recent age. Has been worse over the last 3 days. Worse and short of breath. Worsening cough. Has been bringing up some increased sputum. No fevers. States he has some left chest pain. States it is site of his rib fractures and thinks it could just be the ribs. No abdominal pain. No relief with inhalers at home.  Past Medical History  Diagnosis Date  . HTN (hypertension)   . Bradycardia, sinus 12/24/11  . Syncope and collapse 12/24/11    2/2 hypotension in the setting of dehydration  . Hypotension 12/24/11    in the setting of dehydration   . Abnormal EKG 12/24/11    anteroseptal and lateral ST elevation, felt r/t early repolarization;  Cardiac cath 12/24/11 - normal coronary anatomy, EF 55-65%  . Marijuana use   . History of alcohol abuse     hospitalized for detox 2002  . COPD (chronic obstructive pulmonary disease) [facility])    Past Surgical History  Procedure Laterality Date  . Circumcision  1972  . Laceration repair  02/2004    arthroscopic debridement of triagular fibrocartilage tear/E-chart; right wrist  . Cardiac catheterization  12/24/11    normal coronary anatomy, EF 55-65%  . Left heart catheterization with coronary angiogram N/A 12/24/2011    Procedure: LEFT HEART CATHETERIZATION WITH CORONARY ANGIOGRAM;  Surgeon: Peter M Martinique, MD;  Location: [facility] CATH LAB;  Service: Cardiovascular;  Laterality: N/A;  . Peg placement N/A 10/09/2015    Procedure: PERCUTANEOUS ENDOSCOPIC GASTROSTOMY (PEG) PLACEMENT;  Surgeon:  Judeth Horn, MD;  Location: [place];  Service: General;  Laterality: N/A;  . Esophagogastroduodenoscopy N/A 10/09/2015    Procedure: ESOPHAGOGASTRODUODENOSCOPY (EGD);  Surgeon: Judeth Horn, MD;  Location: [facility] ENDOSCOPY;  Service: General;  Laterality: N/A;  . Percutaneous tracheostomy N/A 10/09/2015    Procedure: PERCUTANEOUS TRACHEOSTOMY;  Surgeon: Judeth Horn, MD;  Location: [facility] OR;  Service: General;  Laterality: N/A;   Family History  Problem Relation Age of Onset  . COPD Sister    Social History  Substance Use Topics  . Smoking status: Never Smoker   . Smokeless tobacco: None  . Alcohol Use: 0.0 oz/week    0 Standard drinks or equivalent per week     Comment: occasionally    Review of Systems  Constitutional: Positive for fatigue. Negative for fever and appetite change.  Respiratory: Positive for shortness of breath.   Cardiovascular: Positive for chest pain.  Gastrointestinal: Negative for abdominal pain.  Genitourinary: Negative for flank pain.  Musculoskeletal: Negative for back pain.  Skin: Negative for wound.  Neurological: Negative for numbness.      Allergies  Review of patient's allergies indicates no known allergies.  Home Medications   Prior to Admission medications   Medication Sig Start Date End Date Taking? Authorizing Provider  albuterol (PROVENTIL HFA;VENTOLIN HFA) 108 (90 BASE) MCG/ACT inhaler Inhale 1-2 puffs into the lungs every 6 (six) hours as needed for wheezing or shortness of breath. 04/03/15  Yes Donne Hazel, MD  albuterol (PROVENTIL) (2.5 MG/3ML) 0.083% nebulizer solution Take 3-6 mLs (2.5-5 mg total) by nebulization every 4 (four) hours as needed for wheezing or shortness of breath. 03/12/15  Yes Shanon Rosser, MD  ALPRAZolam Duanne Moron) 0.5 MG tablet Take 1 tablet (0.5 mg total) by mouth at bedtime as needed for anxiety or sleep. 11/26/15  Yes Nat Christen, MD  benzonatate (TESSALON) 100 MG capsule Take 100 mg by mouth 3 (three) times daily as needed  for cough.   Yes Historical Provider, MD  budesonide-formoterol (SYMBICORT) 160-4.5 MCG/ACT inhaler Inhale 2 puffs into the lungs 2 (two) times daily. 05/13/15  Yes Francesca Oman, DO  finasteride (PROSCAR) 5 MG tablet Take 5 mg by mouth daily.    Yes Historical Provider, MD  flunisolide (NASALIDE) 25 MCG/ACT (0.025%) SOLN Place 2 sprays into the nose 2 (two) times daily as needed (nasal congestion).   Yes Historical Provider, MD  guaiFENesin (MUCINEX) 600 MG 12 hr tablet Take 600 mg by mouth 2 (two) times daily as needed for cough or to loosen phlegm.    Yes Historical Provider, MD  Ibuprofen-Diphenhydramine HCl (ADVIL PM) 200-25 MG CAPS Take 2 capsules by mouth at bedtime as needed (sleep and mild-moderate pain).    Yes Historical Provider, MD  ipratropium-albuterol (DUONEB) 0.5-2.5 (3) MG/3ML SOLN Take 3 mLs by nebulization 3 (three) times daily. Take for 4 days then as needed. Patient taking differently: Take 3 mLs by nebulization 3 (three) times daily as needed (shortness of breath). Take for 4 days then as needed. 01/05/16  Yes Eugenie Filler, MD  lisinopril-hydrochlorothiazide (PRINZIDE,ZESTORETIC) 10-12.5 MG per tablet Take 1 tablet by mouth daily.   Yes Historical Provider, MD  loratadine (CLARITIN) 10 MG tablet Take 1 tablet (10 mg total) by mouth daily. 01/05/16  Yes Eugenie Filler, MD  Multiple Vitamin (MULTIVITAMIN WITH MINERALS) TABS tablet Take 1 tablet by mouth daily.   Yes Historical Provider, MD  omeprazole (PRILOSEC) 20 MG capsule Take 20 mg by mouth 2 (two) times daily as needed (for acid reflux).    Yes Historical Provider, MD  Respiratory Therapy Supplies (NEBULIZER) DEVI 1 Device by Does not apply route as needed. 04/03/15  Yes Donne Hazel, MD  terazosin (HYTRIN) 5 MG capsule Take 5 mg by mouth at bedtime.   Yes Historical Provider, MD  doxycycline (VIBRAMYCIN) 100 MG capsule Take 1 capsule (100 mg total) by mouth 2 (two) times daily. 02/05/16   Davonna Belling, MD  feeding  supplement, ENSURE ENLIVE, (ENSURE ENLIVE) LIQD Take 237 mLs by mouth 3 (three) times daily between meals. Patient not taking: Reported on 02/05/2016 11/07/15   Lisette Abu, PA-C  levofloxacin (LEVAQUIN) 750 MG tablet Take 1 tablet (750 mg total) by mouth daily at 6 PM. Take for 4 days then stop. Patient not taking: Reported on 02/05/2016 01/05/16   Eugenie Filler, MD  predniSONE (DELTASONE) 20 MG tablet Take 2 tablets (40 mg total) by mouth daily. 02/05/16   Davonna Belling, MD   BP 128/78 mmHg  Pulse 87  Temp(Src) 98.2 F (36.8 C) (Oral)  Resp 20  Ht 5\' 6"  (1.676 m)  Wt 135 lb (61.236 kg)  BMI 21.80 kg/m2  SpO2 96% Physical Exam  Constitutional: He appears well-developed.  Cardiovascular: Normal rate.   Pulmonary/Chest: Effort normal.  Mild diffuse wheezes and prolonged expirations.  Abdominal: Soft.  Musculoskeletal: He exhibits no edema.  Neurological: He is alert.  Skin: Skin is warm.  ED Course  Procedures (including critical care time) Labs Review Labs Reviewed  CBC WITH DIFFERENTIAL/PLATELET - Abnormal; Notable for the following:    RDW 17.0 (*)    All other components within normal limits  BASIC METABOLIC PANEL    Imaging Review Dg Chest 2 View  02/05/2016  CLINICAL DATA:  Cough and congestion.  Shortness of breath EXAM: CHEST  2 VIEW COMPARISON:  01/02/2016 FINDINGS: Chronic mild hyperinflation and bronchitic markings. Patient has history of COPD. There is no edema, consolidation, effusion, or pneumothorax. The right middle lobe has reinflated since previous. There was no central airway lesion seen on chest CT 09/22/2015. Normal heart size mediastinal contours. Remote left rib fractures. IMPRESSION: COPD without acute superimposed finding. Electronically Signed   By: Monte Fantasia M.D.   On: 02/05/2016 12:24   I have personally reviewed and evaluated these images and lab results as part of my medical decision-making.   EKG Interpretation   Date/Time:   Wednesday Feb 05 2016 11:52:57 EDT Ventricular Rate:  88 PR Interval:  161 QRS Duration: 80 QT Interval:  359 QTC Calculation: 434 R Axis:   77 Text Interpretation:  Sinus rhythm Confirmed by Alvino Chapel  MD, Ovid Curd  [phone]) on 02/05/2016 11:57:42 AM Also confirmed by Alvino Chapel  MD, Elvie Palomo  [phone]), editor Gilford Rile, CCT, [place] ([id number])  on 02/05/2016 1:05:38 PM      MDM   Final diagnoses:  Chronic obstructive pulmonary disease, unspecified COPD type ([place])    Patient with COPD history with increasing shortness of breath and cough. X-ray reassuring. Has had increased sputum but is not hypoxic with ambulation. We'll give steroids and antibiotics and have follow-up with pulmonology as planned.    Davonna Belling, MD 02/05/16 1630

## 2016-02-05 NOTE — Discharge Instructions (Signed)

## 2016-03-10 ENCOUNTER — Other Ambulatory Visit: Payer: Self-pay

## 2016-03-10 ENCOUNTER — Emergency Department (HOSPITAL_COMMUNITY)
Admission: EM | Admit: 2016-03-10 | Discharge: 2016-03-10 | Disposition: A | Discharge status: Home / Self Care | Payer: Non-veteran care | Attending: Emergency Medicine | Admitting: Emergency Medicine

## 2016-03-10 ENCOUNTER — Encounter (HOSPITAL_COMMUNITY): Payer: Self-pay | Admitting: *Deleted

## 2016-03-10 ENCOUNTER — Emergency Department (HOSPITAL_COMMUNITY): Payer: Non-veteran care

## 2016-03-10 DIAGNOSIS — I10 Essential (primary) hypertension: Secondary | ICD-10-CM | POA: Insufficient documentation

## 2016-03-10 DIAGNOSIS — J441 Chronic obstructive pulmonary disease with (acute) exacerbation: Secondary | ICD-10-CM

## 2016-03-10 DIAGNOSIS — Z79899 Other long term (current) drug therapy: Secondary | ICD-10-CM | POA: Diagnosis not present

## 2016-03-10 DIAGNOSIS — R0602 Shortness of breath: Secondary | ICD-10-CM | POA: Diagnosis present

## 2016-03-10 DIAGNOSIS — Z23 Encounter for immunization: Secondary | ICD-10-CM | POA: Diagnosis not present

## 2016-03-10 DIAGNOSIS — Z791 Long term (current) use of non-steroidal anti-inflammatories (NSAID): Secondary | ICD-10-CM | POA: Insufficient documentation

## 2016-03-10 LAB — CBC WITH DIFFERENTIAL/PLATELET
BASOS ABS: 0.1 10*3/uL (ref 0.0–0.1)
BASOS PCT: 1 %
Eosinophils Absolute: 0.7 10*3/uL (ref 0.0–0.7)
Eosinophils Relative: 12 %
HEMATOCRIT: 37 % — AB (ref 39.0–52.0)
HEMOGLOBIN: 13.2 g/dL (ref 13.0–17.0)
LYMPHS PCT: 30 %
Lymphs Abs: 1.7 10*3/uL (ref 0.7–4.0)
MCH: 31 pg (ref 26.0–34.0)
MCHC: 35.7 g/dL (ref 30.0–36.0)
MCV: 86.9 fL (ref 78.0–100.0)
MONO ABS: 0.6 10*3/uL (ref 0.1–1.0)
MONOS PCT: 10 %
NEUTROS ABS: 2.6 10*3/uL (ref 1.7–7.7)
NEUTROS PCT: 47 %
Platelets: 299 10*3/uL (ref 150–400)
RBC: 4.26 MIL/uL (ref 4.22–5.81)
RDW: 14.7 % (ref 11.5–15.5)
WBC: 5.6 10*3/uL (ref 4.0–10.5)

## 2016-03-10 LAB — BASIC METABOLIC PANEL
ANION GAP: 5 (ref 5–15)
BUN: 12 mg/dL (ref 6–20)
CHLORIDE: 105 mmol/L (ref 101–111)
CO2: 26 mmol/L (ref 22–32)
Calcium: 8.8 mg/dL — ABNORMAL LOW (ref 8.9–10.3)
Creatinine, Ser: 0.68 mg/dL (ref 0.61–1.24)
Glucose, Bld: 109 mg/dL — ABNORMAL HIGH (ref 65–99)
POTASSIUM: 3.5 mmol/L (ref 3.5–5.1)
SODIUM: 136 mmol/L (ref 135–145)

## 2016-03-10 LAB — I-STAT TROPONIN, ED: Troponin i, poc: 0.01 ng/mL (ref 0.00–0.08)

## 2016-03-10 MED ORDER — PNEUMOCOCCAL VAC POLYVALENT 25 MCG/0.5ML IJ INJ
0.5000 mL | INJECTION | INTRAMUSCULAR | Status: DC
Start: 2016-03-11 — End: 2016-03-10

## 2016-03-10 MED ORDER — PREDNISONE 20 MG PO TABS
40.0000 mg | ORAL_TABLET | Freq: Once | ORAL | Status: AC
  Administered 2016-03-10: 40 mg via ORAL
  Filled 2016-03-10: qty 2

## 2016-03-10 MED ORDER — PNEUMOCOCCAL VAC POLYVALENT 25 MCG/0.5ML IJ INJ
0.5000 mL | INJECTION | Freq: Once | INTRAMUSCULAR | Status: AC
  Administered 2016-03-10: 0.5 mL via INTRAMUSCULAR
  Filled 2016-03-10: qty 0.5

## 2016-03-10 MED ORDER — PREDNISONE 20 MG PO TABS
40.0000 mg | ORAL_TABLET | Freq: Every day | ORAL | Status: DC
Start: 2016-03-10 — End: 2016-03-11

## 2016-03-10 NOTE — ED Provider Notes (Signed)
CSN: [id number]     Arrival date & time 03/10/16  [id number] History   First MD Initiated Contact with Patient 03/10/16 0930     Chief Complaint  Patient presents with  . Respiratory Distress     The history is provided by the patient. No language interpreter was used.   [name] [name] is a 65 y.o. male who presents to the Emergency Department complaining of SOB.  He has a hx/o COPD and reports increased SOB since yesterday.  He had used his home albuterol without relief so called EMS today.  He reports cough productive of white sputum.  He denies chest pain, abdominal pain, fevers, leg swelling or pain. He received a duoneb by EMS and his sxs are resolved on ED arrival.  He was hospitalized in April for pneumonia and concerned that this was a recurrence.   Past Medical History  Diagnosis Date  . HTN (hypertension)   . Bradycardia, sinus 12/24/11  . Syncope and collapse 12/24/11    2/2 hypotension in the setting of dehydration  . Hypotension 12/24/11    in the setting of dehydration   . Abnormal EKG 12/24/11    anteroseptal and lateral ST elevation, felt r/t early repolarization;  Cardiac cath 12/24/11 - normal coronary anatomy, EF 55-65%  . Marijuana use   . History of alcohol abuse     hospitalized for detox 2002  . COPD (chronic obstructive pulmonary disease) [facility])    Past Surgical History  Procedure Laterality Date  . Circumcision  1972  . Laceration repair  02/2004    arthroscopic debridement of triagular fibrocartilage tear/E-chart; right wrist  . Cardiac catheterization  12/24/11    normal coronary anatomy, EF 55-65%  . Left heart catheterization with coronary angiogram N/A 12/24/2011    Procedure: LEFT HEART CATHETERIZATION WITH CORONARY ANGIOGRAM;  Surgeon: Peter M Martinique, MD;  Location: [facility] CATH LAB;  Service: Cardiovascular;  Laterality: N/A;  . Peg placement N/A 10/09/2015    Procedure: PERCUTANEOUS ENDOSCOPIC GASTROSTOMY (PEG) PLACEMENT;  Surgeon: Judeth Horn, MD;  Location: [place];  Service: General;  Laterality: N/A;  . Esophagogastroduodenoscopy N/A 10/09/2015    Procedure: ESOPHAGOGASTRODUODENOSCOPY (EGD);  Surgeon: Judeth Horn, MD;  Location: [facility] ENDOSCOPY;  Service: General;  Laterality: N/A;  . Percutaneous tracheostomy N/A 10/09/2015    Procedure: PERCUTANEOUS TRACHEOSTOMY;  Surgeon: Judeth Horn, MD;  Location: [facility] OR;  Service: General;  Laterality: N/A;   Family History  Problem Relation Age of Onset  . COPD Sister    Social History  Substance Use Topics  . Smoking status: Never Smoker   . Smokeless tobacco: None  . Alcohol Use: 0.0 oz/week    0 Standard drinks or equivalent per week     Comment: occasionally    Review of Systems  All other systems reviewed and are negative.     Allergies  Review of patient's allergies indicates no known allergies.  Home Medications   Prior to Admission medications   Medication Sig Start Date End Date Taking? Authorizing Provider  albuterol (PROVENTIL HFA;VENTOLIN HFA) 108 (90 BASE) MCG/ACT inhaler Inhale 1-2 puffs into the lungs every 6 (six) hours as needed for wheezing or shortness of breath. 04/03/15  Yes Donne Hazel, MD  albuterol (PROVENTIL) (2.5 MG/3ML) 0.083% nebulizer solution Take 3-6 mLs (2.5-5 mg total) by nebulization every 4 (four) hours as needed for wheezing or shortness of breath. 03/12/15  Yes Shanon Rosser, MD  ALPRAZolam Duanne Moron) 0.5 MG tablet Take 1 tablet (0.5 mg total)  by mouth at bedtime as needed for anxiety or sleep. 11/26/15  Yes Nat Christen, MD  benzonatate (TESSALON) 100 MG capsule Take 100 mg by mouth 3 (three) times daily as needed for cough.   Yes Historical Provider, MD  budesonide-formoterol (SYMBICORT) 160-4.5 MCG/ACT inhaler Inhale 2 puffs into the lungs 2 (two) times daily. 05/13/15  Yes Francesca Oman, DO  feeding supplement, ENSURE ENLIVE, (ENSURE ENLIVE) LIQD Take 237 mLs by mouth 3 (three) times daily between meals. 11/07/15  Yes Lisette Abu, PA-C  finasteride  (PROSCAR) 5 MG tablet Take 5 mg by mouth daily.    Yes Historical Provider, MD  flunisolide (NASALIDE) 25 MCG/ACT (0.025%) SOLN Place 2 sprays into the nose 2 (two) times daily as needed (nasal congestion).   Yes Historical Provider, MD  guaiFENesin (MUCINEX) 600 MG 12 hr tablet Take 600 mg by mouth 2 (two) times daily as needed for cough or to loosen phlegm.    Yes Historical Provider, MD  ibuprofen (ADVIL,MOTRIN) 200 MG tablet Take 400 mg by mouth every 6 (six) hours as needed for headache.   Yes Historical Provider, MD  Ibuprofen-Diphenhydramine HCl (ADVIL PM) 200-25 MG CAPS Take 2 capsules by mouth at bedtime as needed (sleep and mild-moderate pain).    Yes Historical Provider, MD  ipratropium-albuterol (DUONEB) 0.5-2.5 (3) MG/3ML SOLN Take 3 mLs by nebulization 3 (three) times daily. Take for 4 days then as needed. Patient taking differently: Take 3 mLs by nebulization 3 (three) times daily as needed (shortness of breath).  01/05/16  Yes Eugenie Filler, MD  lisinopril-hydrochlorothiazide (PRINZIDE,ZESTORETIC) 10-12.5 MG per tablet Take 1 tablet by mouth daily.   Yes Historical Provider, MD  loratadine (CLARITIN) 10 MG tablet Take 1 tablet (10 mg total) by mouth daily. 01/05/16  Yes Eugenie Filler, MD  Multiple Vitamin (MULTIVITAMIN WITH MINERALS) TABS tablet Take 1 tablet by mouth daily.   Yes Historical Provider, MD  omeprazole (PRILOSEC) 20 MG capsule Take 20 mg by mouth 2 (two) times daily as needed (for acid reflux).    Yes Historical Provider, MD  Respiratory Therapy Supplies (NEBULIZER) DEVI 1 Device by Does not apply route as needed. 04/03/15  Yes Donne Hazel, MD  terazosin (HYTRIN) 5 MG capsule Take 5 mg by mouth at bedtime.   Yes Historical Provider, MD  predniSONE (DELTASONE) 20 MG tablet Take 2 tablets (40 mg total) by mouth daily. 03/10/16   Quintella Reichert, MD   BP 141/83 mmHg  Pulse 99  Temp(Src) 98.3 F (36.8 C) (Oral)  Resp 24  SpO2 99% Physical Exam  Constitutional: He  is oriented to person, place, and time. He appears well-developed and well-nourished.  HENT:  Head: Normocephalic and atraumatic.  Cardiovascular: Normal rate and regular rhythm.   No murmur heard. Pulmonary/Chest: Effort normal and breath sounds normal. No respiratory distress.  Abdominal: Soft. There is no tenderness. There is no rebound and no guarding.  Musculoskeletal: He exhibits no edema or tenderness.  Neurological: He is alert and oriented to person, place, and time.  Skin: Skin is warm and dry.  Psychiatric: He has a normal mood and affect. His behavior is normal.  Nursing note and vitals reviewed.   ED Course  Procedures (including critical care time) Labs Review Labs Reviewed  BASIC METABOLIC PANEL - Abnormal; Notable for the following:    Glucose, Bld 109 (*)    Calcium 8.8 (*)    All other components within normal limits  CBC WITH DIFFERENTIAL/PLATELET - Abnormal;  Notable for the following:    HCT 37.0 (*)    All other components within normal limits  I-STAT TROPOININ, ED    Imaging Review Dg Chest 2 View  03/10/2016  CLINICAL DATA:  Shortness of Breath EXAM: CHEST  2 VIEW COMPARISON:  Feb 05, 2016 FINDINGS: There is mild central peribronchial thickening, stable. No edema or consolidation. Heart size and pulmonary vascularity are normal. No adenopathy. There is upper lumbar levoscoliosis. Mild calcification is noted in the aortic arch region. IMPRESSION: Changes suggesting chronic bronchitis. No edema or consolidation. Aortic atherosclerotic change. Electronically Signed   By: Lowella Grip III M.D.   On: 03/10/2016 08:56   I have personally reviewed and evaluated these images and lab results as part of my medical decision-making.   EKG Interpretation   Date/Time:  Tuesday March 10 2016 09:53:02 EDT Ventricular Rate:  84 PR Interval:    QRS Duration: 82 QT Interval:  360 QTC Calculation: 426 R Axis:   77 Text Interpretation:  Age not entered, assumed to be   65 years old for  purpose of ECG interpretation Sinus rhythm Biatrial enlargement Probable  anteroseptal infarct, old Confirmed by Hazle Coca [phone]) on 03/10/2016  10:04:23 AM Also confirmed by Hazle Coca [phone]), editor [place] CT, Leda Gauze  [phone]  on 03/10/2016 10:20:17 AM      MDM   Final diagnoses:  COPD exacerbation ([place])    History of COPD here with increased shortness of breath, wheezing prior to ED arrival. His wheezing and shortness of breath have completely resolved after DuoNeb 1 prior to ED arrival. On repeat assessment in the department he continues to have clear lungs and no shortness of breath. He is able to ambulate the department without difficulty. Presentation is consistent with very mild exacerbation. Presentation is not consistent with pneumonia, CHF, ACS, PE. Discussed with patient home care for COPD with continuing his home regimen as prescribed. Will give small bolus of steroids for the next 3 days. Outpatient follow-up and return precautions discussed.  Quintella Reichert, MD 03/10/16 1058

## 2016-03-10 NOTE — Discharge Instructions (Signed)
Chronic Obstructive Pulmonary Disease Chronic obstructive pulmonary disease (COPD) is a common lung condition in which airflow from the lungs is limited. COPD is a general term that can be used to describe many different lung problems that limit airflow, including both chronic bronchitis and emphysema. If you have COPD, your lung function will probably never return to normal, but there are measures you can take to improve lung function and make yourself feel better. CAUSES   Smoking (common).  Exposure to secondhand smoke.  Genetic problems.  Chronic inflammatory lung diseases or recurrent infections. SYMPTOMS  Shortness of breath, especially with physical activity.  Deep, persistent (chronic) cough with a large amount of thick mucus.  Wheezing.  Rapid breaths (tachypnea).  Gray or bluish discoloration (cyanosis) of the skin, especially in your fingers, toes, or lips.  Fatigue.  Weight loss.  Frequent infections or episodes when breathing symptoms become much worse (exacerbations).  Chest tightness. DIAGNOSIS Your health care provider will take a medical history and perform a physical examination to diagnose COPD. Additional tests for COPD may include:  Lung (pulmonary) function tests.  Chest X-ray.  CT scan.  Blood tests. TREATMENT  Treatment for COPD may include:  Inhaler and nebulizer medicines. These help manage the symptoms of COPD and make your breathing more comfortable.  Supplemental oxygen. Supplemental oxygen is only helpful if you have a low oxygen level in your blood.  Exercise and physical activity. These are beneficial for nearly all people with COPD.  Lung surgery or transplant.  Nutrition therapy to gain weight, if you are underweight.  Pulmonary rehabilitation. This may involve working with a team of health care providers and specialists, such as respiratory, occupational, and physical therapists. HOME CARE INSTRUCTIONS  Take all medicines  (inhaled or pills) as directed by your health care provider.  Avoid over-the-counter medicines or cough syrups that dry up your airway (such as antihistamines) and slow down the elimination of secretions unless instructed otherwise by your health care provider.  If you are a smoker, the most important thing that you can do is stop smoking. Continuing to smoke will cause further lung damage and breathing trouble. Ask your health care provider for help with quitting smoking. He or she can direct you to community resources or hospitals that provide support.  Avoid exposure to irritants such as smoke, chemicals, and fumes that aggravate your breathing.  Use oxygen therapy and pulmonary rehabilitation if directed by your health care provider. If you require home oxygen therapy, ask your health care provider whether you should purchase a pulse oximeter to measure your oxygen level at home.  Avoid contact with individuals who have a contagious illness.  Avoid extreme temperature and humidity changes.  Eat healthy foods. Eating smaller, more frequent meals and resting before meals may help you maintain your strength.  Stay active, but balance activity with periods of rest. Exercise and physical activity will help you maintain your ability to do things you want to do.  Preventing infection and hospitalization is very important when you have COPD. Make sure to receive all the vaccines your health care provider recommends, especially the pneumococcal and influenza vaccines. Ask your health care provider whether you need a pneumonia vaccine.  Learn and use relaxation techniques to manage stress.  Learn and use controlled breathing techniques as directed by your health care provider. Controlled breathing techniques include:  Pursed lip breathing. Start by breathing in (inhaling) through your nose for 1 second. Then, purse your lips as if you were   going to whistle and breathe out (exhale) through the  pursed lips for 2 seconds.  Diaphragmatic breathing. Start by putting one hand on your abdomen just above your waist. Inhale slowly through your nose. The hand on your abdomen should move out. Then purse your lips and exhale slowly. You should be able to feel the hand on your abdomen moving in as you exhale.  Learn and use controlled coughing to clear mucus from your lungs. Controlled coughing is a series of short, progressive coughs. The steps of controlled coughing are: 1. Lean your head slightly forward. 2. Breathe in deeply using diaphragmatic breathing. 3. Try to hold your breath for 3 seconds. 4. Keep your mouth slightly open while coughing twice. 5. Spit any mucus out into a tissue. 6. Rest and repeat the steps once or twice as needed. SEEK MEDICAL CARE IF:  You are coughing up more mucus than usual.  There is a change in the color or thickness of your mucus.  Your breathing is more labored than usual.  Your breathing is faster than usual. SEEK IMMEDIATE MEDICAL CARE IF:  You have shortness of breath while you are resting.  You have shortness of breath that prevents you from:  Being able to talk.  Performing your usual physical activities.  You have chest pain lasting longer than 5 minutes.  Your skin color is more cyanotic than usual.  You measure low oxygen saturations for longer than 5 minutes with a pulse oximeter. MAKE SURE YOU:  Understand these instructions.  Will watch your condition.  Will get help right away if you are not doing well or get worse.   This information is not intended to replace advice given to you by your health care provider. Make sure you discuss any questions you have with your health care provider.   Document Released: 06/10/2005 Document Revised: 09/21/2014 Document Reviewed: 04/27/2013 Elsevier Interactive Patient Education 2016 Elsevier Inc[employer]  

## 2016-03-10 NOTE — ED Notes (Signed)
Per EMS - patient comes from home at [facility].  He was dx with PNA in April and was hospitalized.  Patient was seen in ED in May for COPD exacerbation.  He has been having SOB since yesterday.  Patient started albuterol treatments at home yesterday with no relief.  Patient states steam from shower made breathing worse this morning and he called 911.  Patient's sats were 95% on room air prior to duoneb.  No accessory muscle use noted, no JVD.  Patient's sats improved after duoneb and lung sounds now clear.  Patient concerned he still has lingering PNA.  Patient's BP 130/92, HR 92 after tx, RR 18.

## 2016-03-10 NOTE — ED Notes (Signed)
Bed: WA23 Expected date:  Expected time:  Means of arrival:  Comments: EMS- respiratory  

## 2016-03-11 ENCOUNTER — Encounter (HOSPITAL_COMMUNITY): Payer: Self-pay | Admitting: Emergency Medicine

## 2016-03-11 ENCOUNTER — Emergency Department (HOSPITAL_COMMUNITY)
Admission: EM | Admit: 2016-03-11 | Discharge: 2016-03-11 | Disposition: A | Discharge status: Home / Self Care | Payer: Non-veteran care | Attending: Emergency Medicine | Admitting: Emergency Medicine

## 2016-03-11 DIAGNOSIS — Z79899 Other long term (current) drug therapy: Secondary | ICD-10-CM | POA: Insufficient documentation

## 2016-03-11 DIAGNOSIS — R0602 Shortness of breath: Secondary | ICD-10-CM | POA: Diagnosis present

## 2016-03-11 DIAGNOSIS — J441 Chronic obstructive pulmonary disease with (acute) exacerbation: Secondary | ICD-10-CM | POA: Diagnosis not present

## 2016-03-11 DIAGNOSIS — I10 Essential (primary) hypertension: Secondary | ICD-10-CM | POA: Insufficient documentation

## 2016-03-11 MED ORDER — DOXYCYCLINE HYCLATE 100 MG PO CAPS: 100.0000 mg | ORAL_CAPSULE | Freq: Two times a day (BID) | ORAL | Status: DC

## 2016-03-11 MED ORDER — PREDNISONE 20 MG PO TABS: 40.0000 mg | ORAL_TABLET | Freq: Every day | ORAL | Status: DC

## 2016-03-11 NOTE — ED Notes (Signed)
Bed: [id number] Expected date:  Expected time:  Means of arrival:  Comments: EMS- 65yo M, SOB/wheezing/meds given

## 2016-03-11 NOTE — ED Notes (Signed)
Per EMS, pt c/o SOB. Seen yesterday for same. Received albuterol, atrovent, and solumedrol. Wheezing noted.

## 2016-03-11 NOTE — ED Notes (Signed)
MD made aware of family requesting social work consult.

## 2016-03-11 NOTE — Progress Notes (Signed)
CSW was consulted by EDP to speak with patient regarding insurance.  CSW met with patient at bedside. Patient was alert and oriented. Family was present, including daughter who he states is his POA and wants to speak for him.  Daughter informed CSW that she is interested in applying for Medicaid. 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 Per chart, the patient's primary insurance is [employer]. Also, daughter states that the patient will have Medicare in September. However, she states that she is interested in applying for Medicaid.  CSW informed patient and daughter that they would have to apply for insurance through [place].  Patient states that he completes his own ADL's at home. Patient denies falling often, he states that he has not fallen within the past 6 months. Patient also informed CSW that he has a good support system, and feels safe to return home.  Patient and daughter have no other questions for CSW at this time.  Daughter/[name] Rogers [phone]  Willette Brace [phone] ED CSW 03/11/2016 12:17 PM

## 2016-03-11 NOTE — ED Provider Notes (Signed)
CSN: [id number]     Arrival date & time 03/11/16  [id number] History   First MD Initiated Contact with Patient 03/11/16 [phone]     Chief Complaint  Patient presents with  . Shortness of Breath  . Wheezing      Patient is a 65 y.o. male presenting with shortness of breath and wheezing. The history is provided by the patient.  Shortness of Breath Associated symptoms: wheezing   Associated symptoms: no abdominal pain, no chest pain and no headaches   Wheezing Associated symptoms: shortness of breath   Associated symptoms: no chest pain and no headaches   Patient presents with shortness of breath and cough. History of COPD. Seen yesterday for same. Had x-ray negative for infection. States he was feeling worse again this morning. States he's been coughing up white sputum. No fevers. He is on prednisone. No swelling in his legs. He has follow-up with pulmonology in just over a month.  Past Medical History  Diagnosis Date  . HTN (hypertension)   . Bradycardia, sinus 12/24/11  . Syncope and collapse 12/24/11    2/2 hypotension in the setting of dehydration  . Hypotension 12/24/11    in the setting of dehydration   . Abnormal EKG 12/24/11    anteroseptal and lateral ST elevation, felt r/t early repolarization;  Cardiac cath 12/24/11 - normal coronary anatomy, EF 55-65%  . Marijuana use   . History of alcohol abuse     hospitalized for detox 2002  . COPD (chronic obstructive pulmonary disease) [facility])    Past Surgical History  Procedure Laterality Date  . Circumcision  1972  . Laceration repair  02/2004    arthroscopic debridement of triagular fibrocartilage tear/E-chart; right wrist  . Cardiac catheterization  12/24/11    normal coronary anatomy, EF 55-65%  . Left heart catheterization with coronary angiogram N/A 12/24/2011    Procedure: LEFT HEART CATHETERIZATION WITH CORONARY ANGIOGRAM;  Surgeon: Peter M Martinique, MD;  Location: [facility] CATH LAB;  Service: Cardiovascular;  Laterality: N/A;  . Peg  placement N/A 10/09/2015    Procedure: PERCUTANEOUS ENDOSCOPIC GASTROSTOMY (PEG) PLACEMENT;  Surgeon: Judeth Horn, MD;  Location: [place];  Service: General;  Laterality: N/A;  . Esophagogastroduodenoscopy N/A 10/09/2015    Procedure: ESOPHAGOGASTRODUODENOSCOPY (EGD);  Surgeon: Judeth Horn, MD;  Location: [facility] ENDOSCOPY;  Service: General;  Laterality: N/A;  . Percutaneous tracheostomy N/A 10/09/2015    Procedure: PERCUTANEOUS TRACHEOSTOMY;  Surgeon: Judeth Horn, MD;  Location: [facility] OR;  Service: General;  Laterality: N/A;   Family History  Problem Relation Age of Onset  . COPD Sister    Social History  Substance Use Topics  . Smoking status: Never Smoker   . Smokeless tobacco: None  . Alcohol Use: 0.0 oz/week    0 Standard drinks or equivalent per week     Comment: occasionally    Review of Systems  Constitutional: Negative for appetite change.  Respiratory: Positive for shortness of breath and wheezing.   Cardiovascular: Negative for chest pain.  Gastrointestinal: Negative for abdominal pain.  Genitourinary: Negative for flank pain.  Musculoskeletal: Negative for back pain.  Skin: Negative for wound.  Neurological: Negative for headaches.      Allergies  Review of patient's allergies indicates no known allergies.  Home Medications   Prior to Admission medications   Medication Sig Start Date End Date Taking? Authorizing Provider  albuterol (PROVENTIL HFA;VENTOLIN HFA) 108 (90 BASE) MCG/ACT inhaler Inhale 1-2 puffs into the lungs every 6 (six) hours as needed  for wheezing or shortness of breath. 04/03/15  Yes Donne Hazel, MD  albuterol (PROVENTIL) (2.5 MG/3ML) 0.083% nebulizer solution Take 3-6 mLs (2.5-5 mg total) by nebulization every 4 (four) hours as needed for wheezing or shortness of breath. 03/12/15  Yes Shanon Rosser, MD  ALPRAZolam Duanne Moron) 0.5 MG tablet Take 1 tablet (0.5 mg total) by mouth at bedtime as needed for anxiety or sleep. 11/26/15  Yes Nat Christen, MD   benzonatate (TESSALON) 100 MG capsule Take 100 mg by mouth 3 (three) times daily as needed for cough.   Yes Historical Provider, MD  budesonide-formoterol (SYMBICORT) 160-4.5 MCG/ACT inhaler Inhale 2 puffs into the lungs 2 (two) times daily. 05/13/15  Yes Francesca Oman, DO  feeding supplement, ENSURE ENLIVE, (ENSURE ENLIVE) LIQD Take 237 mLs by mouth 3 (three) times daily between meals. 11/07/15  Yes Lisette Abu, PA-C  finasteride (PROSCAR) 5 MG tablet Take 5 mg by mouth daily.    Yes Historical Provider, MD  flunisolide (NASALIDE) 25 MCG/ACT (0.025%) SOLN Place 2 sprays into the nose 2 (two) times daily as needed (nasal congestion).   Yes Historical Provider, MD  guaiFENesin (MUCINEX) 600 MG 12 hr tablet Take 600 mg by mouth 2 (two) times daily as needed for cough or to loosen phlegm.    Yes Historical Provider, MD  ibuprofen (ADVIL,MOTRIN) 200 MG tablet Take 400 mg by mouth every 6 (six) hours as needed for headache.   Yes Historical Provider, MD  Ibuprofen-Diphenhydramine HCl (ADVIL PM) 200-25 MG CAPS Take 2 capsules by mouth at bedtime as needed (sleep and mild-moderate pain).    Yes Historical Provider, MD  ipratropium-albuterol (DUONEB) 0.5-2.5 (3) MG/3ML SOLN Take 3 mLs by nebulization 3 (three) times daily. Take for 4 days then as needed. Patient taking differently: Take 3 mLs by nebulization 3 (three) times daily as needed (shortness of breath).  01/05/16  Yes Eugenie Filler, MD  lisinopril-hydrochlorothiazide (PRINZIDE,ZESTORETIC) 10-12.5 MG per tablet Take 1 tablet by mouth daily.   Yes Historical Provider, MD  loratadine (CLARITIN) 10 MG tablet Take 1 tablet (10 mg total) by mouth daily. 01/05/16  Yes Eugenie Filler, MD  Multiple Vitamin (MULTIVITAMIN WITH MINERALS) TABS tablet Take 1 tablet by mouth daily.   Yes Historical Provider, MD  omeprazole (PRILOSEC) 20 MG capsule Take 20 mg by mouth 2 (two) times daily as needed (for acid reflux).    Yes Historical Provider, MD   Respiratory Therapy Supplies (NEBULIZER) DEVI 1 Device by Does not apply route as needed. 04/03/15  Yes Donne Hazel, MD  terazosin (HYTRIN) 5 MG capsule Take 5 mg by mouth at bedtime.   Yes Historical Provider, MD  doxycycline (VIBRAMYCIN) 100 MG capsule Take 1 capsule (100 mg total) by mouth 2 (two) times daily. 03/11/16   Davonna Belling, MD  predniSONE (DELTASONE) 20 MG tablet Take 2 tablets (40 mg total) by mouth daily. 03/11/16   Davonna Belling, MD   BP 134/83 mmHg  Pulse 97  Resp 26  SpO2 100% Physical Exam  Constitutional: He appears well-developed.  HENT:  Head: Atraumatic.  Cardiovascular: Normal rate.   Pulmonary/Chest:  Mild wheezing and prolonged expiration. No respiratory distress.  Abdominal: Soft. There is no tenderness.  Musculoskeletal: Normal range of motion. He exhibits no edema.  Neurological: He is alert.  Skin: Skin is warm.    ED Course  Procedures (including critical care time) Labs Review Labs Reviewed - No data to display  Imaging Review Dg Chest 2 View  03/10/2016  CLINICAL DATA:  Shortness of Breath EXAM: CHEST  2 VIEW COMPARISON:  Feb 05, 2016 FINDINGS: There is mild central peribronchial thickening, stable. No edema or consolidation. Heart size and pulmonary vascularity are normal. No adenopathy. There is upper lumbar levoscoliosis. Mild calcification is noted in the aortic arch region. IMPRESSION: Changes suggesting chronic bronchitis. No edema or consolidation. Aortic atherosclerotic change. Electronically Signed   By: Lowella Grip III M.D.   On: 03/10/2016 08:56   I have personally reviewed and evaluated these images and lab results as part of my medical decision-making.   EKG Interpretation None      MDM   Final diagnoses:  Chronic obstructive pulmonary disease with acute exacerbation [facility])    Patient presents with shortness of breath. Reportedly has follow-up in one month. X-ray showed chronic bronchitis. Has had increased  sputum production. Will add antibiotics. Will give 2 more days of steroids. Feels better after treatment. Discharge home.    Davonna Belling, MD 03/11/16 [id number]

## 2016-03-11 NOTE — Discharge Instructions (Signed)

## 2016-03-30 ENCOUNTER — Emergency Department (HOSPITAL_COMMUNITY): Payer: Non-veteran care

## 2016-03-30 ENCOUNTER — Emergency Department (HOSPITAL_COMMUNITY)
Admission: EM | Admit: 2016-03-30 | Discharge: 2016-03-30 | Disposition: A | Discharge status: Home / Self Care | Payer: Non-veteran care | Attending: Emergency Medicine | Admitting: Emergency Medicine

## 2016-03-30 ENCOUNTER — Encounter (HOSPITAL_COMMUNITY): Payer: Self-pay | Admitting: Emergency Medicine

## 2016-03-30 DIAGNOSIS — I10 Essential (primary) hypertension: Secondary | ICD-10-CM | POA: Diagnosis not present

## 2016-03-30 DIAGNOSIS — J029 Acute pharyngitis, unspecified: Secondary | ICD-10-CM | POA: Insufficient documentation

## 2016-03-30 DIAGNOSIS — J449 Chronic obstructive pulmonary disease, unspecified: Secondary | ICD-10-CM | POA: Insufficient documentation

## 2016-03-30 LAB — I-STAT TROPONIN, ED: TROPONIN I, POC: 0 ng/mL (ref 0.00–0.08)

## 2016-03-30 LAB — CBC
HCT: 41 % (ref 39.0–52.0)
Hemoglobin: 14.1 g/dL (ref 13.0–17.0)
MCH: 31.3 pg (ref 26.0–34.0)
MCHC: 34.4 g/dL (ref 30.0–36.0)
MCV: 91.1 fL (ref 78.0–100.0)
PLATELETS: 224 10*3/uL (ref 150–400)
RBC: 4.5 MIL/uL (ref 4.22–5.81)
RDW: 14 % (ref 11.5–15.5)
WBC: 11.7 10*3/uL — AB (ref 4.0–10.5)

## 2016-03-30 LAB — BASIC METABOLIC PANEL
Anion gap: 7 (ref 5–15)
BUN: 12 mg/dL (ref 6–20)
CALCIUM: 9.2 mg/dL (ref 8.9–10.3)
CHLORIDE: 103 mmol/L (ref 101–111)
CO2: 25 mmol/L (ref 22–32)
CREATININE: 0.9 mg/dL (ref 0.61–1.24)
GFR calc non Af Amer: >60 mL/min (ref >60)
Glucose, Bld: 92 mg/dL (ref 65–99)
Potassium: 3.8 mmol/L (ref 3.5–5.1)
SODIUM: 135 mmol/L (ref 135–145)

## 2016-03-30 MED ORDER — DEXAMETHASONE 4 MG PO TABS
10.0000 mg | ORAL_TABLET | Freq: Once | ORAL | Status: AC
  Administered 2016-03-30: 10 mg via ORAL
  Filled 2016-03-30: qty 3

## 2016-03-30 MED ORDER — ALBUTEROL SULFATE (2.5 MG/3ML) 0.083% IN NEBU
5.0000 mg | INHALATION_SOLUTION | Freq: Once | RESPIRATORY_TRACT | Status: AC
  Administered 2016-03-30: 5 mg via RESPIRATORY_TRACT
  Filled 2016-03-30: qty 6

## 2016-03-30 MED ORDER — ACETAMINOPHEN 325 MG PO TABS
650.0000 mg | ORAL_TABLET | Freq: Once | ORAL | Status: AC
  Administered 2016-03-30: 650 mg via ORAL
  Filled 2016-03-30: qty 2

## 2016-03-30 NOTE — ED Provider Notes (Signed)
CSN: [id number]     Arrival date & time 03/30/16  [id number] History   First MD Initiated Contact with Patient 03/30/16 [phone]     Chief Complaint  Patient presents with  . Shortness of Breath  . Sore Throat  . Headache  . Cough    (Consider location/radiation/quality/duration/timing/severity/associated sxs/prior Treatment) Patient is a 65 y.o. male presenting with pharyngitis. The history is provided by the patient.  Sore Throat This is a new problem. The current episode started in the past 7 days. The problem occurs constantly. The problem has been unchanged. Associated symptoms include congestion, coughing and headaches. Pertinent negatives include no abdominal pain, chest pain, chills, diaphoresis, fatigue, fever, nausea, neck pain, rash, sore throat or vomiting. The symptoms are aggravated by drinking and eating. He has tried acetaminophen for the symptoms. The treatment provided mild relief.    Past Medical History  Diagnosis Date  . HTN (hypertension)   . Bradycardia, sinus 12/24/11  . Syncope and collapse 12/24/11    2/2 hypotension in the setting of dehydration  . Hypotension 12/24/11    in the setting of dehydration   . Abnormal EKG 12/24/11    anteroseptal and lateral ST elevation, felt r/t early repolarization;  Cardiac cath 12/24/11 - normal coronary anatomy, EF 55-65%  . Marijuana use   . History of alcohol abuse     hospitalized for detox 2002  . COPD (chronic obstructive pulmonary disease) [facility])    Past Surgical History  Procedure Laterality Date  . Circumcision  1972  . Laceration repair  02/2004    arthroscopic debridement of triagular fibrocartilage tear/E-chart; right wrist  . Cardiac catheterization  12/24/11    normal coronary anatomy, EF 55-65%  . Left heart catheterization with coronary angiogram N/A 12/24/2011    Procedure: LEFT HEART CATHETERIZATION WITH CORONARY ANGIOGRAM;  Surgeon: Peter M Martinique, MD;  Location: [facility] CATH LAB;  Service: Cardiovascular;  Laterality:  N/A;  . Peg placement N/A 10/09/2015    Procedure: PERCUTANEOUS ENDOSCOPIC GASTROSTOMY (PEG) PLACEMENT;  Surgeon: Judeth Horn, MD;  Location: [place];  Service: General;  Laterality: N/A;  . Esophagogastroduodenoscopy N/A 10/09/2015    Procedure: ESOPHAGOGASTRODUODENOSCOPY (EGD);  Surgeon: Judeth Horn, MD;  Location: [facility] ENDOSCOPY;  Service: General;  Laterality: N/A;  . Percutaneous tracheostomy N/A 10/09/2015    Procedure: PERCUTANEOUS TRACHEOSTOMY;  Surgeon: Judeth Horn, MD;  Location: [facility] OR;  Service: General;  Laterality: N/A;   Family History  Problem Relation Age of Onset  . COPD Sister    Social History  Substance Use Topics  . Smoking status: Never Smoker   . Smokeless tobacco: None  . Alcohol Use: 0.0 oz/week    0 Standard drinks or equivalent per week     Comment: occasionally    Review of Systems  Constitutional: Negative for fever, chills, diaphoresis and fatigue.  HENT: Positive for congestion and rhinorrhea. Negative for sinus pressure and sore throat.   Eyes: Negative for pain.  Respiratory: Positive for cough. Negative for shortness of breath and wheezing.   Cardiovascular: Negative for chest pain, palpitations and leg swelling.  Gastrointestinal: Negative for nausea, vomiting, abdominal pain and diarrhea.  Endocrine: Negative.   Genitourinary: Negative for flank pain.  Musculoskeletal: Negative for back pain and neck pain.  Skin: Negative for rash.  Allergic/Immunologic: Negative.   Neurological: Positive for headaches. Negative for dizziness, syncope and light-headedness.  Psychiatric/Behavioral: Negative for confusion.      Allergies  Review of patient's allergies indicates no known allergies.  Home  Medications   Prior to Admission medications   Medication Sig Start Date End Date Taking? Authorizing Provider  albuterol (PROVENTIL HFA;VENTOLIN HFA) 108 (90 BASE) MCG/ACT inhaler Inhale 1-2 puffs into the lungs every 6 (six) hours as needed for wheezing  or shortness of breath. 04/03/15  Yes Donne Hazel, MD  albuterol (PROVENTIL) (2.5 MG/3ML) 0.083% nebulizer solution Take 3-6 mLs (2.5-5 mg total) by nebulization every 4 (four) hours as needed for wheezing or shortness of breath. 03/12/15  Yes Shanon Rosser, MD  ALPRAZolam Duanne Moron) 0.5 MG tablet Take 1 tablet (0.5 mg total) by mouth at bedtime as needed for anxiety or sleep. 11/26/15  Yes Nat Christen, MD  benzonatate (TESSALON) 100 MG capsule Take 100 mg by mouth 3 (three) times daily as needed for cough.   Yes Historical Provider, MD  budesonide-formoterol (SYMBICORT) 160-4.5 MCG/ACT inhaler Inhale 2 puffs into the lungs 2 (two) times daily. 05/13/15  Yes Francesca Oman, DO  feeding supplement, ENSURE ENLIVE, (ENSURE ENLIVE) LIQD Take 237 mLs by mouth 3 (three) times daily between meals. 11/07/15  Yes Lisette Abu, PA-C  finasteride (PROSCAR) 5 MG tablet Take 5 mg by mouth 2 (two) times daily.    Yes Historical Provider, MD  flunisolide (NASALIDE) 25 MCG/ACT (0.025%) SOLN Place 2 sprays into the nose 2 (two) times daily as needed (nasal congestion).   Yes Historical Provider, MD  guaiFENesin (MUCINEX) 600 MG 12 hr tablet Take 600 mg by mouth 2 (two) times daily as needed for cough or to loosen phlegm.    Yes Historical Provider, MD  ipratropium-albuterol (DUONEB) 0.5-2.5 (3) MG/3ML SOLN Take 3 mLs by nebulization 3 (three) times daily. Take for 4 days then as needed. Patient taking differently: Take 3 mLs by nebulization 3 (three) times daily as needed (shortness of breath).  01/05/16  Yes Eugenie Filler, MD  lisinopril-hydrochlorothiazide (PRINZIDE,ZESTORETIC) 10-12.5 MG per tablet Take 1 tablet by mouth daily.   Yes Historical Provider, MD  loratadine (CLARITIN) 10 MG tablet Take 1 tablet (10 mg total) by mouth daily. 01/05/16  Yes Eugenie Filler, MD  Multiple Vitamin (MULTIVITAMIN WITH MINERALS) TABS tablet Take 1 tablet by mouth daily.   Yes Historical Provider, MD  omeprazole (PRILOSEC) 20 MG  capsule Take 20 mg by mouth 2 (two) times daily as needed (for acid reflux).    Yes Historical Provider, MD  terazosin (HYTRIN) 5 MG capsule Take 5 mg by mouth at bedtime.   Yes Historical Provider, MD  tiotropium (SPIRIVA) 18 MCG inhalation capsule Place 18 mcg into inhaler and inhale daily.   Yes Historical Provider, MD  ibuprofen (ADVIL,MOTRIN) 200 MG tablet Take 400 mg by mouth every 6 (six) hours as needed for headache.    Historical Provider, MD  Respiratory Therapy Supplies (NEBULIZER) DEVI 1 Device by Does not apply route as needed. 04/03/15   Donne Hazel, MD   BP 127/83 mmHg  Pulse 97  Temp(Src) 99.1 F (37.3 C) (Oral)  Resp 20  Ht 5\' 6"  (1.676 m)  Wt 63.504 kg  BMI 22.61 kg/m2  SpO2 94% Physical Exam  Constitutional: He is oriented to person, place, and time. He appears well-developed and well-nourished.  HENT:  Head: Normocephalic and atraumatic.    Mouth/Throat: Uvula is midline and mucous membranes are normal. No trismus in the jaw. No dental abscesses, uvula swelling or dental caries. Posterior oropharyngeal edema present. No oropharyngeal exudate, posterior oropharyngeal erythema or tonsillar abscesses.  Eyes: Conjunctivae and EOM are normal. Pupils  are equal, round, and reactive to light.  Neck: Normal range of motion. Neck supple.  Cardiovascular: Normal rate, regular rhythm, normal heart sounds and intact distal pulses.   Pulmonary/Chest: Effort normal. No respiratory distress. He has wheezes (bilateral diffuse expiratory).  Abdominal: Soft. Bowel sounds are normal. There is no tenderness.  Musculoskeletal: Normal range of motion.  Neurological: He is alert and oriented to person, place, and time. He has normal reflexes. No cranial nerve deficit.  Skin: Skin is warm and dry.    ED Course  Procedures (including critical care time) Labs Review Labs Reviewed  CBC - Abnormal; Notable for the following:    WBC 11.7 (*)    All other components within normal limits    BASIC METABOLIC PANEL  Randolm Idol, ED    Imaging Review Dg Chest 2 View  03/30/2016  CLINICAL DATA:  65 y/o M; 3 days of cough and congestion with sore throat. Shortness of breath today. History of hypertension and COPD. Nonsmoker. EXAM: CHEST  2 VIEW COMPARISON:  Chest radiograph dated 03/10/2016. FINDINGS: Normal cardiomediastinal silhouette. Clear lungs. No consolidation, pneumothorax, or effusion. New Mild dextro curvature of the thoracolumbar junction. Bones are otherwise unremarkable. IMPRESSION: No active cardiopulmonary disease. Electronically Signed   By: Kristine Garbe M.D.   On: 03/30/2016 08:43   I have personally reviewed and evaluated these images and lab results as part of my medical decision-making.   EKG Interpretation   Date/Time:  Monday March 30 2016 07:35:04 EDT Ventricular Rate:  108 PR Interval:  148 QRS Duration: 82 QT Interval:  324 QTC Calculation: 434 R Axis:   75 Text Interpretation:  Sinus tachycardia Right atrial enlargement  Nonspecific T wave abnormality Abnormal ECG possible inferior ischemia New  since previous tracing Confirmed by JACUBOWITZ  MD, SAM [phone]) on  03/30/2016 8:01:42 AM      MDM   Final diagnoses:  Sore throat    The patient is a 65 year old male with a history of hypertension, COPD presenting for headache and sore throat for the last 3 days. Denies any increasing shortness of breath chest pain. Reports increased sputum production rhinorrhea and frontal headache.  On evaluation pt HDS in NAD.  No hypoxia.  Mild wheezing bilaterally and pt feels not consistent with previous COPD exacerbations.  Given breathing tx int the ED. ECG performed by nursing which displayed inferior T wave inversions but pt denies increasing SOB at rest or exertion, CP, or anginal equivalents on ROS.  Trop negative.  Low suspicion for ACS/PE at this time.  Sx ongoing for 3 days and do not feel delta trop necessary at this time.   Sore throat  for three days.  Oropharynx clear and low suspicion for strep pharyngitis. No evidence of peritonsillar abscess. Note pain with range of motion of neck and low suspicion for deep neck infection. Only mild tenderness with movement of trachea and no voice changes. Low suspicion for epiglottitis at this time. Given Decadron in the ED and Tylenol for symptomatic relief. In setting of cough sore throat increasing rhinorrhea with negative chest x-ray and mild leukocytosis in the ED feel most likely viral syndrome. Indicated in depth on return precautions was patient understood.  Labs and ECG were viewed by myself  incorporated into medical decision making.  Discussed pertinent finding with patient or caregiver prior to discharge with no further questions.  Immediate return precautions given and understood.  Medical decision making supervised by my attending Dr. Winfred Leeds.   Geronimo Boot, MD  PGY-3 Emergency Medicine     Geronimo Boot, MD 03/30/16 [place], MD 03/30/16 [phone]

## 2016-03-30 NOTE — ED Provider Notes (Signed)
Complains of cough productive of white sputum and sore throat with pain on swallowing for the past 3 days. Denies any fever. No other associated symptoms. On exam mild distress with pain on swallowing, handling secretions well. Oropharynx is normal. He is edentulous. Neck supple. Tender anterior cervical node on right. Trachea midline. No pain on manipulation of laryngeal cartilage. Lungs and a story wheezes. Coughing occasionally  Orlie Dakin, MD 03/30/16 [phone]

## 2016-03-30 NOTE — Discharge Instructions (Signed)
Pharyngitis °Pharyngitis is a sore throat (pharynx). There is redness, pain, and swelling of your throat. °HOME CARE  °· Drink enough fluids to keep your pee (urine) clear or pale yellow. °· Only take medicine as told by your doctor. °¨ You may get sick again if you do not take medicine as told. Finish your medicines, even if you start to feel better. °¨ Do not take aspirin. °· Rest. °· Rinse your mouth (gargle) with salt water (½ tsp of salt per 1 qt of water) every 1-2 hours. This will help the pain. °· If you are not at risk for choking, you can suck on hard candy or sore throat lozenges. °GET HELP IF: °· You have large, tender lumps on your neck. °· You have a rash. °· You cough up green, yellow-brown, or bloody spit. °GET HELP RIGHT AWAY IF:  °· You have a stiff neck. °· You drool or cannot swallow liquids. °· You throw up (vomit) or are not able to keep medicine or liquids down. °· You have very bad pain that does not go away with medicine. °· You have problems breathing (not from a stuffy nose). °MAKE SURE YOU:  °· Understand these instructions. °· Will watch your condition. °· Will get help right away if you are not doing well or get worse. °  °This information is not intended to replace advice given to you by your health care provider. Make sure you discuss any questions you have with your health care provider. °  °Document Released: 02/17/2008 Document Revised: 06/21/2013 Document Reviewed: 05/08/2013 °Elsevier Interactive Patient Education ©2016 [employer] ° °

## 2016-03-30 NOTE — ED Notes (Signed)
Pt c/o shortness of breath, productive cough, headache and sore throat for about 3 days. Pt has history of COPD and has tried inhalers and nebulizer without relief.

## 2016-05-08 ENCOUNTER — Emergency Department (HOSPITAL_COMMUNITY): Payer: Non-veteran care

## 2016-05-08 ENCOUNTER — Inpatient Hospital Stay (HOSPITAL_COMMUNITY)
Admission: EM | Admit: 2016-05-08 | Discharge: 2016-05-10 | DRG: 190 | Disposition: A | Discharge status: Home / Self Care | Payer: Non-veteran care | Attending: Internal Medicine | Admitting: Internal Medicine

## 2016-05-08 ENCOUNTER — Encounter (HOSPITAL_COMMUNITY): Payer: Self-pay | Admitting: *Deleted

## 2016-05-08 DIAGNOSIS — J9811 Atelectasis: Secondary | ICD-10-CM | POA: Diagnosis present

## 2016-05-08 DIAGNOSIS — J441 Chronic obstructive pulmonary disease with (acute) exacerbation: Secondary | ICD-10-CM | POA: Diagnosis not present

## 2016-05-08 DIAGNOSIS — J189 Pneumonia, unspecified organism: Secondary | ICD-10-CM | POA: Diagnosis not present

## 2016-05-08 DIAGNOSIS — J440 Chronic obstructive pulmonary disease with acute lower respiratory infection: Secondary | ICD-10-CM | POA: Diagnosis not present

## 2016-05-08 DIAGNOSIS — Z7951 Long term (current) use of inhaled steroids: Secondary | ICD-10-CM

## 2016-05-08 DIAGNOSIS — J449 Chronic obstructive pulmonary disease, unspecified: Secondary | ICD-10-CM

## 2016-05-08 DIAGNOSIS — Z79899 Other long term (current) drug therapy: Secondary | ICD-10-CM

## 2016-05-08 DIAGNOSIS — J9601 Acute respiratory failure with hypoxia: Secondary | ICD-10-CM | POA: Diagnosis present

## 2016-05-08 DIAGNOSIS — I10 Essential (primary) hypertension: Secondary | ICD-10-CM | POA: Diagnosis present

## 2016-05-08 DIAGNOSIS — R0602 Shortness of breath: Secondary | ICD-10-CM | POA: Diagnosis not present

## 2016-05-08 DIAGNOSIS — J181 Lobar pneumonia, unspecified organism: Secondary | ICD-10-CM | POA: Diagnosis present

## 2016-05-08 DIAGNOSIS — E46 Unspecified protein-calorie malnutrition: Secondary | ICD-10-CM | POA: Diagnosis present

## 2016-05-08 LAB — CBC WITH DIFFERENTIAL/PLATELET
Basophils Absolute: 0 10*3/uL (ref 0.0–0.1)
Basophils Relative: 0 %
EOS ABS: 0.5 10*3/uL (ref 0.0–0.7)
EOS PCT: 6 %
HCT: 38.2 % — ABNORMAL LOW (ref 39.0–52.0)
Hemoglobin: 13.2 g/dL (ref 13.0–17.0)
LYMPHS ABS: 0.7 10*3/uL (ref 0.7–4.0)
Lymphocytes Relative: 8 %
MCH: 30.8 pg (ref 26.0–34.0)
MCHC: 34.6 g/dL (ref 30.0–36.0)
MCV: 89 fL (ref 78.0–100.0)
MONOS PCT: 6 %
Monocytes Absolute: 0.5 10*3/uL (ref 0.1–1.0)
Neutro Abs: 6.8 10*3/uL (ref 1.7–7.7)
Neutrophils Relative %: 80 %
PLATELETS: 233 10*3/uL (ref 150–400)
RBC: 4.29 MIL/uL (ref 4.22–5.81)
RDW: 13.6 % (ref 11.5–15.5)
WBC: 8.5 10*3/uL (ref 4.0–10.5)

## 2016-05-08 LAB — COMPREHENSIVE METABOLIC PANEL
ALT: 25 U/L (ref 17–63)
ANION GAP: 7 (ref 5–15)
AST: 25 U/L (ref 15–41)
Albumin: 4 g/dL (ref 3.5–5.0)
Alkaline Phosphatase: 100 U/L (ref 38–126)
BUN: 19 mg/dL (ref 6–20)
CHLORIDE: 104 mmol/L (ref 101–111)
CO2: 26 mmol/L (ref 22–32)
Calcium: 9 mg/dL (ref 8.9–10.3)
Creatinine, Ser: 1.12 mg/dL (ref 0.61–1.24)
GFR calc non Af Amer: >60 mL/min (ref >60)
Glucose, Bld: 129 mg/dL — ABNORMAL HIGH (ref 65–99)
Potassium: 3.5 mmol/L (ref 3.5–5.1)
SODIUM: 137 mmol/L (ref 135–145)
Total Bilirubin: 0.8 mg/dL (ref 0.3–1.2)
Total Protein: 7.5 g/dL (ref 6.5–8.1)

## 2016-05-08 LAB — I-STAT CHEM 8, ED
BUN: 19 mg/dL (ref 6–20)
CALCIUM ION: 1.1 mmol/L — AB (ref 1.12–1.23)
Chloride: 102 mmol/L (ref 101–111)
Creatinine, Ser: 1.1 mg/dL (ref 0.61–1.24)
Glucose, Bld: 129 mg/dL — ABNORMAL HIGH (ref 65–99)
HCT: 42 % (ref 39.0–52.0)
Hemoglobin: 14.3 g/dL (ref 13.0–17.0)
Potassium: 3.5 mmol/L (ref 3.5–5.1)
SODIUM: 138 mmol/L (ref 135–145)
TCO2: 25 mmol/L (ref 0–100)

## 2016-05-08 LAB — I-STAT CG4 LACTIC ACID, ED: LACTIC ACID, VENOUS: 0.99 mmol/L (ref 0.5–1.9)

## 2016-05-08 MED ORDER — ALBUTEROL SULFATE (2.5 MG/3ML) 0.083% IN NEBU
5.0000 mg | INHALATION_SOLUTION | Freq: Once | RESPIRATORY_TRACT | Status: AC
  Administered 2016-05-08: 5 mg via RESPIRATORY_TRACT
  Filled 2016-05-08: qty 6

## 2016-05-08 MED ORDER — IPRATROPIUM-ALBUTEROL 0.5-2.5 (3) MG/3ML IN SOLN
3.0000 mL | Freq: Once | RESPIRATORY_TRACT | Status: AC
  Administered 2016-05-08: 3 mL via RESPIRATORY_TRACT
  Filled 2016-05-08: qty 3

## 2016-05-08 MED ORDER — ALBUTEROL SULFATE (2.5 MG/3ML) 0.083% IN NEBU: 2.5000 mg | INHALATION_SOLUTION | Freq: Once | RESPIRATORY_TRACT | Status: DC

## 2016-05-08 MED ORDER — LEVOFLOXACIN IN D5W 750 MG/150ML IV SOLN
750.0000 mg | Freq: Once | INTRAVENOUS | Status: AC
  Administered 2016-05-08: 750 mg via INTRAVENOUS
  Filled 2016-05-08: qty 150

## 2016-05-08 MED ORDER — PREDNISONE 20 MG PO TABS
60.0000 mg | ORAL_TABLET | Freq: Once | ORAL | Status: AC
  Administered 2016-05-08: 60 mg via ORAL
  Filled 2016-05-08: qty 3

## 2016-05-08 NOTE — ED Notes (Signed)
Pt ambulated from the room to the end of the hall and back to the room and around the the nurse desk and back to the room oxygen level 88%-90%
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
## 2016-05-08 NOTE — Progress Notes (Signed)
Patient noted to have been seen in the ED 7 times within the last six months with three admissions. EDCM spoke to patient and his daughter Kipp Brood Rogers/POA at bedside. Patient confirms his pcp is Dr. Meyer Russel and the [facility]. Patient reports he receives his medications for free through the [place] and are delivered to his house. Patient reports he is supposed to get Medicare insurance in September. Patient does not wear oxygen at home. Patient does not have any difficulty with transportation to his doctor appointments.  Patient's daughter reports she takes the patient to his appointments when she can. No further EDCM needs at this time.
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
## 2016-05-08 NOTE — ED Triage Notes (Signed)
Per EMS. Pt began to feel SOB while walking home this afternoon. Hx of COPD. Pt was speaking in short phrases, tripoding and had wheezing throughout upon EMS arrival. EMS gave 10mg  albuterol and 0.5mg  atrovent nebulizer. Pt feeling better after treatment, though still having wheezing.

## 2016-05-09 ENCOUNTER — Inpatient Hospital Stay (HOSPITAL_COMMUNITY): Payer: Non-veteran care

## 2016-05-09 ENCOUNTER — Encounter (HOSPITAL_COMMUNITY): Payer: Self-pay | Admitting: Internal Medicine

## 2016-05-09 DIAGNOSIS — J441 Chronic obstructive pulmonary disease with (acute) exacerbation: Secondary | ICD-10-CM

## 2016-05-09 DIAGNOSIS — J440 Chronic obstructive pulmonary disease with acute lower respiratory infection: Secondary | ICD-10-CM | POA: Diagnosis present

## 2016-05-09 DIAGNOSIS — Z79899 Other long term (current) drug therapy: Secondary | ICD-10-CM | POA: Diagnosis not present

## 2016-05-09 DIAGNOSIS — E46 Unspecified protein-calorie malnutrition: Secondary | ICD-10-CM | POA: Diagnosis present

## 2016-05-09 DIAGNOSIS — Z7951 Long term (current) use of inhaled steroids: Secondary | ICD-10-CM | POA: Diagnosis not present

## 2016-05-09 DIAGNOSIS — J9601 Acute respiratory failure with hypoxia: Secondary | ICD-10-CM | POA: Diagnosis present

## 2016-05-09 DIAGNOSIS — I10 Essential (primary) hypertension: Secondary | ICD-10-CM | POA: Diagnosis present

## 2016-05-09 DIAGNOSIS — J181 Lobar pneumonia, unspecified organism: Secondary | ICD-10-CM | POA: Diagnosis present

## 2016-05-09 DIAGNOSIS — R0602 Shortness of breath: Secondary | ICD-10-CM | POA: Diagnosis present

## 2016-05-09 DIAGNOSIS — J189 Pneumonia, unspecified organism: Secondary | ICD-10-CM

## 2016-05-09 DIAGNOSIS — J9811 Atelectasis: Secondary | ICD-10-CM | POA: Diagnosis present

## 2016-05-09 LAB — CBC
HCT: 39.3 % (ref 39.0–52.0)
Hemoglobin: 13.8 g/dL (ref 13.0–17.0)
MCH: 31 pg (ref 26.0–34.0)
MCHC: 35.1 g/dL (ref 30.0–36.0)
MCV: 88.3 fL (ref 78.0–100.0)
PLATELETS: 235 10*3/uL (ref 150–400)
RBC: 4.45 MIL/uL (ref 4.22–5.81)
RDW: 13.6 % (ref 11.5–15.5)
WBC: 8 10*3/uL (ref 4.0–10.5)

## 2016-05-09 LAB — COMPREHENSIVE METABOLIC PANEL
ALK PHOS: 106 U/L (ref 38–126)
ALT: 26 U/L (ref 17–63)
AST: 21 U/L (ref 15–41)
Albumin: 4.1 g/dL (ref 3.5–5.0)
Anion gap: 9 (ref 5–15)
BILIRUBIN TOTAL: 0.5 mg/dL (ref 0.3–1.2)
BUN: 19 mg/dL (ref 6–20)
CALCIUM: 9.2 mg/dL (ref 8.9–10.3)
CO2: 23 mmol/L (ref 22–32)
CREATININE: 0.92 mg/dL (ref 0.61–1.24)
Chloride: 103 mmol/L (ref 101–111)
Glucose, Bld: 140 mg/dL — ABNORMAL HIGH (ref 65–99)
Potassium: 3.6 mmol/L (ref 3.5–5.1)
Sodium: 135 mmol/L (ref 135–145)
Total Protein: 8.1 g/dL (ref 6.5–8.1)

## 2016-05-09 LAB — MAGNESIUM: Magnesium: 2 mg/dL (ref 1.7–2.4)

## 2016-05-09 LAB — HIV ANTIBODY (ROUTINE TESTING W REFLEX): HIV SCREEN 4TH GENERATION: NONREACTIVE

## 2016-05-09 LAB — EXPECTORATED SPUTUM ASSESSMENT W GRAM STAIN, RFLX TO RESP C

## 2016-05-09 LAB — EXPECTORATED SPUTUM ASSESSMENT W REFEX TO RESP CULTURE

## 2016-05-09 LAB — PHOSPHORUS: Phosphorus: 3.6 mg/dL (ref 2.5–4.6)

## 2016-05-09 LAB — TSH: TSH: 0.703 u[IU]/mL (ref 0.350–4.500)

## 2016-05-09 LAB — STREP PNEUMONIAE URINARY ANTIGEN: STREP PNEUMO URINARY ANTIGEN: NEGATIVE

## 2016-05-09 MED ORDER — HYDROCODONE-ACETAMINOPHEN 5-325 MG PO TABS: 1.0000 | ORAL_TABLET | ORAL | Status: DC | PRN

## 2016-05-09 MED ORDER — ENOXAPARIN SODIUM 40 MG/0.4ML ~~LOC~~ SOLN
40.0000 mg | SUBCUTANEOUS | Status: DC
  Administered 2016-05-09: 40 mg via SUBCUTANEOUS
  Filled 2016-05-09: qty 0.4

## 2016-05-09 MED ORDER — METHYLPREDNISOLONE SODIUM SUCC 40 MG IJ SOLR
40.0000 mg | Freq: Two times a day (BID) | INTRAMUSCULAR | Status: DC
  Administered 2016-05-09 – 2016-05-10 (×3): 40 mg via INTRAVENOUS
  Filled 2016-05-09 (×3): qty 1

## 2016-05-09 MED ORDER — ONDANSETRON HCL 4 MG PO TABS: 4.0000 mg | ORAL_TABLET | Freq: Four times a day (QID) | ORAL | Status: DC | PRN

## 2016-05-09 MED ORDER — DEXTROSE 5 % IV SOLN
500.0000 mg | INTRAVENOUS | Status: DC
  Administered 2016-05-09: 500 mg via INTRAVENOUS
  Filled 2016-05-09: qty 500

## 2016-05-09 MED ORDER — HYDROCHLOROTHIAZIDE 12.5 MG PO CAPS
12.5000 mg | ORAL_CAPSULE | Freq: Every day | ORAL | Status: DC
  Administered 2016-05-09 – 2016-05-10 (×2): 12.5 mg via ORAL
  Filled 2016-05-09 (×2): qty 1

## 2016-05-09 MED ORDER — ADULT MULTIVITAMIN W/MINERALS CH
1.0000 | ORAL_TABLET | Freq: Every day | ORAL | Status: DC
  Administered 2016-05-09 – 2016-05-10 (×2): 1 via ORAL
  Filled 2016-05-09 (×2): qty 1

## 2016-05-09 MED ORDER — ACETAMINOPHEN 650 MG RE SUPP: 650.0000 mg | Freq: Four times a day (QID) | RECTAL | Status: DC | PRN

## 2016-05-09 MED ORDER — LORATADINE 10 MG PO TABS
10.0000 mg | ORAL_TABLET | Freq: Every day | ORAL | Status: DC
  Administered 2016-05-09 – 2016-05-10 (×2): 10 mg via ORAL
  Filled 2016-05-09 (×2): qty 1

## 2016-05-09 MED ORDER — CEFTRIAXONE SODIUM 1 G IJ SOLR
1.0000 g | INTRAMUSCULAR | Status: DC
  Administered 2016-05-09: 1 g via INTRAVENOUS
  Filled 2016-05-09: qty 10

## 2016-05-09 MED ORDER — GUAIFENESIN ER 600 MG PO TB12: 600.0000 mg | ORAL_TABLET | Freq: Two times a day (BID) | ORAL | Status: DC | PRN

## 2016-05-09 MED ORDER — ALBUTEROL SULFATE (2.5 MG/3ML) 0.083% IN NEBU: 2.5000 mg | INHALATION_SOLUTION | RESPIRATORY_TRACT | Status: DC | PRN

## 2016-05-09 MED ORDER — ENSURE ENLIVE PO LIQD
237.0000 mL | Freq: Three times a day (TID) | ORAL | Status: DC
  Administered 2016-05-09 – 2016-05-10 (×3): 237 mL via ORAL

## 2016-05-09 MED ORDER — GUAIFENESIN-DM 100-10 MG/5ML PO SYRP: 5.0000 mL | ORAL_SOLUTION | ORAL | Status: DC | PRN

## 2016-05-09 MED ORDER — IOPAMIDOL (ISOVUE-300) INJECTION 61%
75.0000 mL | Freq: Once | INTRAVENOUS | Status: AC | PRN
  Administered 2016-05-09: 75 mL via INTRAVENOUS

## 2016-05-09 MED ORDER — PANTOPRAZOLE SODIUM 40 MG PO TBEC
40.0000 mg | DELAYED_RELEASE_TABLET | Freq: Every day | ORAL | Status: DC
  Administered 2016-05-09 – 2016-05-10 (×2): 40 mg via ORAL
  Filled 2016-05-09 (×2): qty 1

## 2016-05-09 MED ORDER — GUAIFENESIN ER 600 MG PO TB12
1200.0000 mg | ORAL_TABLET | Freq: Two times a day (BID) | ORAL | Status: DC | PRN
  Administered 2016-05-09: 1200 mg via ORAL
  Filled 2016-05-09: qty 2

## 2016-05-09 MED ORDER — LISINOPRIL 10 MG PO TABS
10.0000 mg | ORAL_TABLET | Freq: Every day | ORAL | Status: DC
  Administered 2016-05-09 – 2016-05-10 (×2): 10 mg via ORAL
  Filled 2016-05-09 (×2): qty 1

## 2016-05-09 MED ORDER — TERAZOSIN HCL 5 MG PO CAPS
5.0000 mg | ORAL_CAPSULE | Freq: Every day | ORAL | Status: DC
Start: 2016-05-09 — End: 2016-05-10
  Administered 2016-05-09 (×2): 5 mg via ORAL
  Filled 2016-05-09 (×2): qty 1

## 2016-05-09 MED ORDER — LISINOPRIL-HYDROCHLOROTHIAZIDE 10-12.5 MG PO TABS: 1.0000 | ORAL_TABLET | Freq: Every day | ORAL | Status: DC

## 2016-05-09 MED ORDER — ALPRAZOLAM 0.5 MG PO TABS: 0.5000 mg | ORAL_TABLET | Freq: Every evening | ORAL | Status: DC | PRN

## 2016-05-09 MED ORDER — SODIUM CHLORIDE 0.9 % IV SOLN
INTRAVENOUS | Status: DC
  Administered 2016-05-09: 02:00:00 via INTRAVENOUS

## 2016-05-09 MED ORDER — IPRATROPIUM-ALBUTEROL 0.5-2.5 (3) MG/3ML IN SOLN
3.0000 mL | Freq: Three times a day (TID) | RESPIRATORY_TRACT | Status: DC
  Administered 2016-05-09 – 2016-05-10 (×4): 3 mL via RESPIRATORY_TRACT
  Filled 2016-05-09 (×4): qty 3

## 2016-05-09 MED ORDER — BENZONATATE 100 MG PO CAPS
100.0000 mg | ORAL_CAPSULE | Freq: Three times a day (TID) | ORAL | Status: DC | PRN
Start: 2016-05-09 — End: 2016-05-10

## 2016-05-09 MED ORDER — ONDANSETRON HCL 4 MG/2ML IJ SOLN: 4.0000 mg | Freq: Four times a day (QID) | INTRAMUSCULAR | Status: DC | PRN

## 2016-05-09 MED ORDER — ACETAMINOPHEN 325 MG PO TABS
650.0000 mg | ORAL_TABLET | Freq: Four times a day (QID) | ORAL | Status: DC | PRN
  Administered 2016-05-09 – 2016-05-10 (×2): 650 mg via ORAL
  Filled 2016-05-09 (×2): qty 2

## 2016-05-09 MED ORDER — MOMETASONE FURO-FORMOTEROL FUM 200-5 MCG/ACT IN AERO
2.0000 | INHALATION_SPRAY | Freq: Two times a day (BID) | RESPIRATORY_TRACT | Status: DC
  Administered 2016-05-09 – 2016-05-10 (×3): 2 via RESPIRATORY_TRACT
  Filled 2016-05-09: qty 8.8

## 2016-05-09 MED ORDER — SODIUM CHLORIDE 0.9% FLUSH
3.0000 mL | Freq: Two times a day (BID) | INTRAVENOUS | Status: DC
  Administered 2016-05-09 (×2): 3 mL via INTRAVENOUS

## 2016-05-09 MED ORDER — ZOLPIDEM TARTRATE 5 MG PO TABS: 5.0000 mg | ORAL_TABLET | Freq: Every evening | ORAL | Status: DC | PRN

## 2016-05-09 MED ORDER — FINASTERIDE 5 MG PO TABS
5.0000 mg | ORAL_TABLET | Freq: Two times a day (BID) | ORAL | Status: DC
  Administered 2016-05-09 – 2016-05-10 (×4): 5 mg via ORAL
  Filled 2016-05-09 (×4): qty 1

## 2016-05-09 NOTE — Progress Notes (Signed)
Sputum container left at bedside for sputum culture. Patient unable to produce sample. Informed patient to call RT or RN when sample is produced.

## 2016-05-09 NOTE — Progress Notes (Signed)
Occupational Therapy Evaluation [name] MRN: [id number] DOB: Jul 28, 1951 Today's Date: 05/09/2016    History of Present Illness 65yo M Presented with shortness of breath while walking home this afternoon been having worsening dyspnea on exertion and increased wheezing. PMH includes COPD, HTN, malnutrition, multiple rib fx   Clinical Impression   Patient independent/modified independent with ADLs and mobility in room and hallway. Reviewed energy conservation techniques with patient; he reports he utilizes these strategies at home. No further OT needs at this time. Will sign off.    Follow Up Recommendations  No OT follow up    Equipment Recommendations  None recommended by OT    Recommendations for Other Services       Precautions / Restrictions        Mobility Bed Mobility Overal bed mobility: Independent                Transfers Overall transfer level: Independent Equipment used: None   Ambulated in room/hallway no AD without difficulty.                  Balance Overall balance assessment: No apparent balance deficits (not formally assessed)                                          ADL Overall ADL's : Modified independent                                       General ADL Comments: No difficulties; takes rest breaks as needed (self-initiated)     Vision     Perception     Praxis      Pertinent Vitals/Pain Pain Assessment: No/denies pain     Hand Dominance     Extremity/Trunk Assessment Upper Extremity Assessment Upper Extremity Assessment: Overall WFL for tasks assessed   Lower Extremity Assessment Lower Extremity Assessment: Overall WFL for tasks assessed   Cervical / Trunk Assessment Cervical / Trunk Assessment: Normal   Communication Communication Communication: No difficulties   Cognition Arousal/Alertness: Awake/alert Behavior During Therapy: WFL for tasks  assessed/performed Overall Cognitive Status: Within Functional Limits for tasks assessed                     General Comments       Exercises       Shoulder Instructions      Home Living Family/patient expects to be discharged to:: Private residence Living Arrangements: Alone Available Help at Discharge: Neighbor Type of Home: Apartment Home Access: Elevator     Home Layout: One level     Bathroom Shower/Tub: Occupational psychologist: Standard     Home Equipment: Shower seat          Prior Functioning/Environment Level of Independence: Independent             OT Diagnosis: Generalized weakness   OT Problem List: Cardiopulmonary status limiting activity   OT Treatment/Interventions:      OT Goals(Current goals can be found in the care plan section) Acute Rehab OT Goals OT Goal Formulation: All assessment and education complete, DC therapy  OT Frequency:     Barriers to D/C:            Co-evaluation  End of Session Nurse Communication: Mobility status  Activity Tolerance: Patient tolerated treatment well Patient left: in bed;with call bell/phone within reach   Time: [id number] OT Time Calculation (min): 10 min Charges:  OT General Charges $OT Visit: 1 Procedure OT Evaluation $OT Eval Low Complexity: 1 Procedure G-Codes:    [name] [name] A 05/18/16, 9:04 AM

## 2016-05-09 NOTE — Progress Notes (Signed)
PT  Note  Patient Details Name: Dekendrick Sligh MRN: [id number] DOB: 1951-09-12   Cancelled Treatment:     PT order received but eval deferred based on OT eval and screen.  Pt is IND with mobility and ambulation in room and hallways.  No PT needs at this time and will dc from service.    Kyia Rhude 05/09/2016, 11:57 AM

## 2016-05-09 NOTE — Progress Notes (Signed)
PROGRESS NOTE                                                                                                                                                                                                             Patient Demographics:    [name] [name], is a 65 y.o. male, DOB - [birth year], [id number]  Admit date - 05/08/2016   Admitting Physician Toy Baker, MD  Outpatient Primary MD for the patient is [name], [name], NP  LOS - 0  Outpatient Specialists: Pulmonologist at [facility].  Chief Complaint  Patient presents with  . Shortness of Breath  . COPD       Brief Narrative  65 year old male with history of COPD, essential hypertension presented with 2 weeks of progressive shortness of breath with productive cough. Patient found to have lobar pneumonia along with COPD exacerbation.   Subjective:    Still has productive cough. Breathing much better this morning   Assessment  & Plan :    Active Problems:   COPD with acute exacerbation ([place]) Symptoms better this morning. Continue IV Solu Medrol, DuoNeb's and when necessary albuterol net. Supportive care with Tylenol and antitussives. Continue empiric antibiotics.     CAP (community acquired pneumonia) Continue Rocephin and azithromycin. Antitussives. Follow blood cultures. CT chest done given the current symptoms shows atelectasis in the right middle lobe without any endobronchial lesion or pulmonary mass.     Acute respiratory failure with hypoxia (HCC) Secondary to lobar pneumonia and COPD. Now stable on room air.  Essential hypertension Resume home blood pressure medications  1.    Code Status : Full code  Family Communication  :None at bedside  Disposition Plan  :Home possibly tomorrow if symptoms improved. Patient follows at the [place].  Barriers For Discharge : Active symptoms  Consults  :  None  Procedures  : CT chest  DVT  Prophylaxis  :  Lovenox -   Lab Results  Component Value Date   PLT 235 05/09/2016    Antibiotics  :    Anti-infectives    Start     Dose/Rate Route Frequency Ordered Stop   05/09/16 2300  azithromycin (ZITHROMAX) 500 mg in dextrose 5 % 250 mL IVPB     500 mg 250 mL/hr over 60 Minutes Intravenous Every 24 hours 05/09/16 0151 05/16/16 2259  05/09/16 2200  cefTRIAXone (ROCEPHIN) 1 g in dextrose 5 % 50 mL IVPB     1 g 100 mL/hr over 30 Minutes Intravenous Every 24 hours 05/09/16 0151 05/16/16 2159   05/08/16 2145  levofloxacin (LEVAQUIN) IVPB 750 mg     750 mg 100 mL/hr over 90 Minutes Intravenous  Once 05/08/16 2130 05/09/16 0000        Objective:   Vitals:   05/09/16 0130 05/09/16 0146 05/09/16 0819 05/09/16 0824  BP: 147/85 (!) 154/84    Pulse: 90 91    Resp: 25     Temp:  98.2 F (36.8 C)    TempSrc:  Oral    SpO2: 93% 96% 93% 93%  Weight:  65.7 kg (144 lb 12.8 oz)    Height:  5\' 6"  (1.676 m)      Wt Readings from Last 3 Encounters:  05/09/16 65.7 kg (144 lb 12.8 oz)  03/30/16 63.5 kg (140 lb)  02/05/16 61.2 kg (135 lb)     Intake/Output Summary (Last 24 hours) at 05/09/16 1146 Last data filed at 05/09/16 0913  Gross per 24 hour  Intake           458.33 ml  Output              450 ml  Net             8.33 ml     Physical Exam  Gen: not in distress HEENT: moist mucosa, supple neck Chest: clear b/l, no added sounds CVS: N S1&S2, no murmurs, rubs or gallop GI: soft, NT, ND, BS+ Musculoskeletal: warm, no edema CNS: Alert and oriented    Data Review:    CBC  Recent Labs Lab 05/08/16 2138 05/08/16 2151 05/09/16 0207  WBC 8.5  --  8.0  HGB 13.2 14.3 13.8  HCT 38.2* 42.0 39.3  PLT 233  --  235  MCV 89.0  --  88.3  MCH 30.8  --  31.0  MCHC 34.6  --  35.1  RDW 13.6  --  13.6  LYMPHSABS 0.7  --   --   MONOABS 0.5  --   --   EOSABS 0.5  --   --   BASOSABS 0.0  --   --     Chemistries   Recent Labs Lab 05/08/16 2138 05/08/16 2151  05/09/16 0207  NA 137 138 135  K 3.5 3.5 3.6  CL 104 102 103  CO2 26  --  23  GLUCOSE 129* 129* 140*  BUN 19 19 19   CREATININE 1.12 1.10 0.92  CALCIUM 9.0  --  9.2  MG  --   --  2.0  AST 25  --  21  ALT 25  --  26  ALKPHOS 100  --  106  BILITOT 0.8  --  0.5   ------------------------------------------------------------------------------------------------------------------ No results for input(s): CHOL, HDL, LDLCALC, TRIG, CHOLHDL, LDLDIRECT in the last 72 hours.  Lab Results  Component Value Date   HGBA1C 5.9 (H) 12/24/2011   ------------------------------------------------------------------------------------------------------------------  Recent Labs  05/09/16 0207  TSH 0.703   ------------------------------------------------------------------------------------------------------------------ No results for input(s): VITAMINB12, FOLATE, FERRITIN, TIBC, IRON, RETICCTPCT in the last 72 hours.  Coagulation profile No results for input(s): INR, PROTIME in the last 168 hours.  No results for input(s): DDIMER in the last 72 hours.  Cardiac Enzymes No results for input(s): CKMB, TROPONINI, MYOGLOBIN in the last 168 hours.  Invalid input(s): CK ------------------------------------------------------------------------------------------------------------------    Component Value Date/Time  BNP 11.1 07/18/2015 0424    Inpatient Medications  Scheduled Meds: . albuterol  2.5 mg Nebulization Once  . albuterol  2.5 mg Nebulization Once  . azithromycin  500 mg Intravenous Q24H  . cefTRIAXone (ROCEPHIN)  IV  1 g Intravenous Q24H  . enoxaparin (LOVENOX) injection  40 mg Subcutaneous Q24H  . feeding supplement (ENSURE ENLIVE)  237 mL Oral TID BM  . finasteride  5 mg Oral BID  . ipratropium-albuterol  3 mL Nebulization TID  . loratadine  10 mg Oral Daily  . methylPREDNISolone (SOLU-MEDROL) injection  40 mg Intravenous Q12H  . mometasone-formoterol  2 puff Inhalation BID  .  pantoprazole  40 mg Oral Daily  . sodium chloride flush  3 mL Intravenous Q12H  . terazosin  5 mg Oral QHS   Continuous Infusions:  PRN Meds:.acetaminophen **OR** acetaminophen, albuterol, ALPRAZolam, benzonatate, guaiFENesin, guaiFENesin-dextromethorphan, HYDROcodone-acetaminophen, ondansetron **OR** ondansetron (ZOFRAN) IV, zolpidem  Micro Results Recent Results (from the past 240 hour(s))  Culture, sputum-assessment     Status: None   Collection Time: 05/09/16  1:49 AM  Result Value Ref Range Status   Specimen Description SPUTUM  Final   Special Requests NONE  Final   Sputum evaluation   Final    MICROSCOPIC FINDINGS SUGGEST THAT THIS SPECIMEN IS NOT REPRESENTATIVE OF LOWER RESPIRATORY SECRETIONS. PLEASE RECOLLECT. Anise Salvo AT 1120 ON 08.26.2017 BY NBROOKS    Report Status 05/09/2016 FINAL  Final    Radiology Reports Dg Chest 2 View  Result Date: 05/08/2016 CLINICAL DATA:  Shortness of breath and wheezing, onset today while walking home. EXAM: CHEST  2 VIEW COMPARISON:  03/30/2016 FINDINGS: Normal heart size and pulmonary vascularity. Linear opacity over the right mid lung likely to represent consolidation and atelectasis in the right middle lung. Left lung is clear. Small nodular opacities projected over both mid lungs are present on previous studies and likely represent prominent nipple shadows. No blunting of costophrenic angles. No pneumothorax. Mediastinal contours appear intact. Mild calcification of the aorta. IMPRESSION: Consolidation and atelectasis in the right middle lung likely to represent pneumonia. Followup PA and lateral chest X-ray is recommended in 3-4 weeks following trial of antibiotic therapy to ensure resolution and exclude underlying malignancy. Electronically Signed   By: Lucienne Capers M.D.   On: 05/08/2016 19:22   Ct Chest W Contrast  Result Date: 05/09/2016 CLINICAL DATA:  Cough for 1 year.  History of COPD. EXAM: CT CHEST WITH CONTRAST TECHNIQUE:  Multidetector CT imaging of the chest was performed following the standard protocol during bolus administration of intravenous contrast. CONTRAST:  20mL ISOVUE-300 IOPAMIDOL (ISOVUE-300) INJECTION 61% COMPARISON:  September 18, 2015 FINDINGS: CT CHEST FINDINGS Mediastinum/Lymph Nodes: No masses, pathologically enlarged lymph nodes, or other significant abnormality. The heart size is normal. There is no pericardial effusion. Lungs/Pleura: There is volume loss and atelectasis of the central right middle lobe without endobronchial lesion identified. This is new compared to prior CT. There is no focal pneumonia pleural effusion or pulmonary mass. There is no pulmonary edema. There is minimal atelectasis of the inferior anterior left upper lobe. Musculoskeletal: No chest wall mass or suspicious bone lesions identified. IMPRESSION: Volume loss and atelectasis of the central right middle lobe without endobronchial lesion identified, new since prior CT of January 2017. No pulmonary mass. Electronically Signed   By: Abelardo Diesel M.D.   On: 05/09/2016 09:56    Time Spent in minutes  25   Louellen Molder M.D on 05/09/2016 at 11:46 AM  Between  7am to 7pm - Pager - [phone]  After 7pm go to [email or web address] - password [facility]  [facility] -  Office  [phone]

## 2016-05-10 DIAGNOSIS — J9601 Acute respiratory failure with hypoxia: Secondary | ICD-10-CM

## 2016-05-10 MED ORDER — GUAIFENESIN ER 600 MG PO TB12: 1200.0000 mg | ORAL_TABLET | Freq: Two times a day (BID) | ORAL | 0 refills | Status: DC | PRN

## 2016-05-10 MED ORDER — IPRATROPIUM-ALBUTEROL 0.5-2.5 (3) MG/3ML IN SOLN: 3.0000 mL | Freq: Two times a day (BID) | RESPIRATORY_TRACT | Status: DC

## 2016-05-10 MED ORDER — DOXYCYCLINE HYCLATE 100 MG PO TABS: 100.0000 mg | ORAL_TABLET | Freq: Two times a day (BID) | ORAL | 0 refills | Status: AC

## 2016-05-10 MED ORDER — LEVOFLOXACIN 750 MG PO TABS: 750.0000 mg | ORAL_TABLET | Freq: Every day | ORAL | 0 refills | Status: DC

## 2016-05-10 MED ORDER — PREDNISONE 20 MG PO TABS: 40.0000 mg | ORAL_TABLET | Freq: Every day | ORAL | 0 refills | Status: AC

## 2016-05-10 NOTE — Discharge Instructions (Signed)

## 2016-05-10 NOTE — Care Management Note (Signed)
Case Management Note  Patient Details  Name: [name] [name] MRN: [id number] Date of Birth: [birth year]  Subjective/Objective:   Acute respiratory failure with hypoxia /   CAP (community acquired pneumonia)   COPD with acute exacerbation               Action/Plan: Discharge home   Expected Discharge Date:   05/10/2016               Expected Discharge Plan:  Home/Self Care  In-House Referral:     Discharge planning Services  CM Consult  Post Acute Care Choice:    Choice offered to:     DME Arranged:    DME Agency:     HH Arranged:    [place] Agency:     Status of Service:  Completed, signed off  If discussed at [employer] of Stay Meetings, dates discussed:    Additional Comments: Patient discharged prior to [facility] consult.  Spoke to patient who referred me to his daughter, explained that without insurance the cost for [facility] visit was $150.00 per visit through [place] as patient does not have insurance at this time.  Per notes patient and verified with daughter patient will be receiving Medicare in September explained at that time patient could have his PCP write another order for COPD disease management which would be covered by insurance at that time.  Daughter stated that they would follow up in September for COPD disease management.  No additional needs voiced.     Corine Shelter, RN 05/10/2016, 2:33 PM

## 2016-05-10 NOTE — Discharge Summary (Signed)
Physician Discharge Summary  [name] [name] [id number] DOB: December 29, 1950 DOA: 05/08/2016  PCP: Linus Mako, NP Pulmonologist: Follows at [place] date: 05/08/2016 Discharge date: 05/10/2016  Admitted From: Home Disposition: Home  Recommendations for Outpatient Follow-up:  1. Follow up with PCP in 1-2 weeks. Completes 5 day course of antibiotics on 8/31. Completes oral prednisone on 9/1.  Home Health: None Equipment/Devices: None  Discharge Condition: Stable CODE STATUS: Full code Diet recommendation: Regular    Discharge Diagnoses:  Principal Problem:   Acute respiratory failure with hypoxia (HCC)   Active Problems:     CAP (community acquired pneumonia)   COPD with acute exacerbation (HCC)   HTN (hypertension)   Benign essential HTN   Brief narrative/history of present illness Please refer to admission H&P for details, in brief,65 year old male with history of COPD, essential hypertension presented with 2 weeks of progressive shortness of breath with productive cough. Patient found to have lobar pneumonia along with COPD exacerbation.  Hospital course Active Problems:   COPD with acute exacerbation ([place]) Symptoms improved with IV Solu-Medrol, DuoNeb's and antitussives.  will discharge him on oral prednisone for 5 day course. Patient should follow-up with his PCP and pulmonologist at the [facility].     CAP (community acquired pneumonia) Placed on Rocephin and azithromycin. . CT chest done given the current symptoms shows atelectasis in the right middle lobe without any endobronchial lesion or pulmonary mass. Remains afebrile and symptoms much improved today. I will discharge him on oral doxycycline to complete 5 day course of antibiotics.     Acute respiratory failure with hypoxia (HCC) Secondary to lobar pneumonia and COPD.  stable on room air.  Essential hypertension Stable on home blood pressure medications     Family Communication  :None at  bedside  Disposition Plan  : Home  Consults  :  None  Procedures  : CT chest  Discharge Instructions     Medication List    TAKE these medications   albuterol (2.5 MG/3ML) 0.083% nebulizer solution Commonly known as:  PROVENTIL Take 3-6 mLs (2.5-5 mg total) by nebulization every 4 (four) hours as needed for wheezing or shortness of breath.   albuterol 108 (90 Base) MCG/ACT inhaler Commonly known as:  PROVENTIL HFA;VENTOLIN HFA Inhale 1-2 puffs into the lungs every 6 (six) hours as needed for wheezing or shortness of breath.   ALPRAZolam 0.5 MG tablet Commonly known as:  XANAX Take 1 tablet (0.5 mg total) by mouth at bedtime as needed for anxiety or sleep.   benzonatate 100 MG capsule Commonly known as:  TESSALON Take 100 mg by mouth 3 (three) times daily as needed for cough.   budesonide-formoterol 160-4.5 MCG/ACT inhaler Commonly known as:  SYMBICORT Inhale 2 puffs into the lungs 2 (two) times daily.   doxycycline 100 MG tablet Commonly known as:  VIBRA-TABS Take 1 tablet (100 mg total) by mouth 2 (two) times daily.   feeding supplement (ENSURE ENLIVE) Liqd Take 237 mLs by mouth 3 (three) times daily between meals.   finasteride 5 MG tablet Commonly known as:  PROSCAR Take 5 mg by mouth 2 (two) times daily.   flunisolide 25 MCG/ACT (0.025%) Soln Commonly known as:  NASALIDE Place 2 sprays into the nose 2 (two) times daily as needed (nasal congestion).   guaiFENesin 600 MG 12 hr tablet Commonly known as:  MUCINEX Take 2 tablets (1,200 mg total) by mouth 2 (two) times daily as needed for cough or to loosen phlegm. What changed:  how much to take   ibuprofen 200 MG tablet Commonly known as:  ADVIL,MOTRIN Take 400 mg by mouth every 6 (six) hours as needed for headache.   ipratropium-albuterol 0.5-2.5 (3) MG/3ML Soln Commonly known as:  DUONEB Take 3 mLs by nebulization 3 (three) times daily. Take for 4 days then as needed. What changed:  when to take  this  reasons to take this  additional instructions   lisinopril-hydrochlorothiazide 10-12.5 MG tablet Commonly known as:  PRINZIDE,ZESTORETIC Take 1 tablet by mouth daily.   loratadine 10 MG tablet Commonly known as:  CLARITIN Take 1 tablet (10 mg total) by mouth daily.   multivitamin with minerals Tabs tablet Take 1 tablet by mouth daily.   Nebulizer Devi 1 Device by Does not apply route as needed.   omeprazole 20 MG capsule Commonly known as:  PRILOSEC Take 20 mg by mouth 2 (two) times daily as needed (for acid reflux).   predniSONE 20 MG tablet Commonly known as:  DELTASONE Take 2 tablets (40 mg total) by mouth daily with breakfast.   terazosin 5 MG capsule Commonly known as:  HYTRIN Take 5 mg by mouth at bedtime.   tiotropium 18 MCG inhalation capsule Commonly known as:  SPIRIVA Place 18 mcg into inhaler and inhale daily.      Follow-up Information    [name], [name], NP. Schedule an appointment as soon as possible for a visit in 1 week(s).   Specialty:  Nurse Practitioner Contact information: [place] [address] [phone]          No Known Allergies      Procedures/Studies: Dg Chest 2 View  Result Date: 05/08/2016 CLINICAL DATA:  Shortness of breath and wheezing, onset today while walking home. EXAM: CHEST  2 VIEW COMPARISON:  03/30/2016 FINDINGS: Normal heart size and pulmonary vascularity. Linear opacity over the right mid lung likely to represent consolidation and atelectasis in the right middle lung. Left lung is clear. Small nodular opacities projected over both mid lungs are present on previous studies and likely represent prominent nipple shadows. No blunting of costophrenic angles. No pneumothorax. Mediastinal contours appear intact. Mild calcification of the aorta. IMPRESSION: Consolidation and atelectasis in the right middle lung likely to represent pneumonia. Followup PA and lateral chest X-ray is recommended in 3-4  weeks following trial of antibiotic therapy to ensure resolution and exclude underlying malignancy. Electronically Signed   By: Lucienne Capers M.D.   On: 05/08/2016 19:22   Ct Chest W Contrast  Result Date: 05/09/2016 CLINICAL DATA:  Cough for 1 year.  History of COPD. EXAM: CT CHEST WITH CONTRAST TECHNIQUE: Multidetector CT imaging of the chest was performed following the standard protocol during bolus administration of intravenous contrast. CONTRAST:  7mL ISOVUE-300 IOPAMIDOL (ISOVUE-300) INJECTION 61% COMPARISON:  September 18, 2015 FINDINGS: CT CHEST FINDINGS Mediastinum/Lymph Nodes: No masses, pathologically enlarged lymph nodes, or other significant abnormality. The heart size is normal. There is no pericardial effusion. Lungs/Pleura: There is volume loss and atelectasis of the central right middle lobe without endobronchial lesion identified. This is new compared to prior CT. There is no focal pneumonia pleural effusion or pulmonary mass. There is no pulmonary edema. There is minimal atelectasis of the inferior anterior left upper lobe. Musculoskeletal: No chest wall mass or suspicious bone lesions identified. IMPRESSION: Volume loss and atelectasis of the central right middle lobe without endobronchial lesion identified, new since prior CT of January 2017. No pulmonary mass. Electronically Signed   By: Seward Meth  Augustin Coupe M.D.   On: 05/09/2016 09:56       Subjective: Breathing and cough much better this morning.  Discharge Exam: Vitals:   05/09/16 2039 05/10/16 0427  BP: 132/77 122/84  Pulse: 92 85  Resp: (!) 22 20  Temp: 98.4 F (36.9 C) 98.1 F (36.7 C)   Vitals:   05/09/16 2039 05/10/16 0427 05/10/16 0828 05/10/16 0832  BP: 132/77 122/84    Pulse: 92 85    Resp: (!) 22 20    Temp: 98.4 F (36.9 C) 98.1 F (36.7 C)    TempSrc: Oral Oral    SpO2: 93% 94% 93% 93%  Weight:      Height:        General:Middle aged man not in distress HEENT: No pallor, moist  mucosa Cardiovascular: RRR, S1/S2 +, no rubs, no gallops Respiratory: CTA bilaterally, no wheezing, no rhonchi Abdominal: Soft, NT, ND, bowel sounds + Extremities: Warm, no edema    The results of significant diagnostics from this hospitalization (including imaging, microbiology, ancillary and laboratory) are listed below for reference.     Microbiology: Recent Results (from the past 240 hour(s))  Culture, sputum-assessment     Status: None   Collection Time: 05/09/16  1:49 AM  Result Value Ref Range Status   Specimen Description SPUTUM  Final   Special Requests NONE  Final   Sputum evaluation   Final    MICROSCOPIC FINDINGS SUGGEST THAT THIS SPECIMEN IS NOT REPRESENTATIVE OF LOWER RESPIRATORY SECRETIONS. PLEASE RECOLLECT. Anise Salvo AT 1120 ON 08.26.2017 BY NBROOKS    Report Status 05/09/2016 FINAL  Final     Labs: BNP (last 3 results)  Recent Labs  07/18/15 0424  BNP [id number]   Basic Metabolic Panel:  Recent Labs Lab 05/08/16 2138 05/08/16 2151 05/09/16 0207  NA 137 138 135  K 3.5 3.5 3.6  CL 104 102 103  CO2 26  --  23  GLUCOSE 129* 129* 140*  BUN 19 19 19   CREATININE 1.12 1.10 0.92  CALCIUM 9.0  --  9.2  MG  --   --  2.0  PHOS  --   --  3.6   Liver Function Tests:  Recent Labs Lab 05/08/16 2138 05/09/16 0207  AST 25 21  ALT 25 26  ALKPHOS 100 106  BILITOT 0.8 0.5  PROT 7.5 8.1  ALBUMIN 4.0 4.1   No results for input(s): LIPASE, AMYLASE in the last 168 hours. No results for input(s): AMMONIA in the last 168 hours. CBC:  Recent Labs Lab 05/08/16 2138 05/08/16 2151 05/09/16 0207  WBC 8.5  --  8.0  NEUTROABS 6.8  --   --   HGB 13.2 14.3 13.8  HCT 38.2* 42.0 39.3  MCV 89.0  --  88.3  PLT 233  --  235   Cardiac Enzymes: No results for input(s): CKTOTAL, CKMB, CKMBINDEX, TROPONINI in the last 168 hours. BNP: Invalid input(s): POCBNP CBG: No results for input(s): GLUCAP in the last 168 hours. D-Dimer No results for input(s): DDIMER in the  last 72 hours. Hgb A1c No results for input(s): HGBA1C in the last 72 hours. Lipid Profile No results for input(s): CHOL, HDL, LDLCALC, TRIG, CHOLHDL, LDLDIRECT in the last 72 hours. Thyroid function studies  Recent Labs  05/09/16 0207  TSH 0.703   Anemia work up No results for input(s): VITAMINB12, FOLATE, FERRITIN, TIBC, IRON, RETICCTPCT in the last 72 hours. Urinalysis    Component Value Date/Time   COLORURINE YELLOW 12/20/2015 0104  APPEARANCEUR CLEAR 12/20/2015 0104   LABSPEC 1.026 12/20/2015 0104   PHURINE 6.0 12/20/2015 0104   GLUCOSEU NEGATIVE 12/20/2015 0104   HGBUR NEGATIVE 12/20/2015 0104   BILIRUBINUR NEGATIVE 12/20/2015 0104   KETONESUR 40 (A) 12/20/2015 0104   PROTEINUR NEGATIVE 12/20/2015 0104   UROBILINOGEN 0.2 09/30/2013 0224   NITRITE NEGATIVE 12/20/2015 0104   LEUKOCYTESUR SMALL (A) 12/20/2015 0104   Sepsis Labs Invalid input(s): PROCALCITONIN,  WBC,  LACTICIDVEN Microbiology Recent Results (from the past 240 hour(s))  Culture, sputum-assessment     Status: None   Collection Time: 05/09/16  1:49 AM  Result Value Ref Range Status   Specimen Description SPUTUM  Final   Special Requests NONE  Final   Sputum evaluation   Final    MICROSCOPIC FINDINGS SUGGEST THAT THIS SPECIMEN IS NOT REPRESENTATIVE OF LOWER RESPIRATORY SECRETIONS. PLEASE RECOLLECT. Anise Salvo AT 1120 ON 08.26.2017 BY NBROOKS    Report Status 05/09/2016 FINAL  Final     Time coordinating discharge: Over 30 minutes  SIGNED:   Louellen Molder, MD  [facility] 05/10/2016, 9:45 AM Pager   If 7PM-7AM, please contact night-coverage [email or web address] Password [email or web address]

## 2016-05-11 LAB — LEGIONELLA PNEUMOPHILA SEROGP 1 UR AG: L. pneumophila Serogp 1 Ur Ag: NEGATIVE

## 2016-06-09 ENCOUNTER — Emergency Department (HOSPITAL_COMMUNITY): Payer: Medicare Other

## 2016-06-09 ENCOUNTER — Encounter (HOSPITAL_COMMUNITY): Payer: Self-pay

## 2016-06-09 ENCOUNTER — Emergency Department (HOSPITAL_COMMUNITY)
Admission: EM | Admit: 2016-06-09 | Discharge: 2016-06-09 | Disposition: A | Discharge status: Home / Self Care | Payer: Medicare Other | Attending: Emergency Medicine | Admitting: Emergency Medicine

## 2016-06-09 DIAGNOSIS — J441 Chronic obstructive pulmonary disease with (acute) exacerbation: Secondary | ICD-10-CM | POA: Insufficient documentation

## 2016-06-09 DIAGNOSIS — Z79899 Other long term (current) drug therapy: Secondary | ICD-10-CM | POA: Insufficient documentation

## 2016-06-09 DIAGNOSIS — I10 Essential (primary) hypertension: Secondary | ICD-10-CM | POA: Insufficient documentation

## 2016-06-09 DIAGNOSIS — R0602 Shortness of breath: Secondary | ICD-10-CM | POA: Diagnosis present

## 2016-06-09 LAB — CBC
HEMATOCRIT: 39.1 % (ref 39.0–52.0)
Hemoglobin: 13.3 g/dL (ref 13.0–17.0)
MCH: 30.9 pg (ref 26.0–34.0)
MCHC: 34 g/dL (ref 30.0–36.0)
MCV: 90.7 fL (ref 78.0–100.0)
Platelets: 263 10*3/uL (ref 150–400)
RBC: 4.31 MIL/uL (ref 4.22–5.81)
RDW: 14 % (ref 11.5–15.5)
WBC: 6.9 10*3/uL (ref 4.0–10.5)

## 2016-06-09 LAB — BASIC METABOLIC PANEL
ANION GAP: 9 (ref 5–15)
BUN: 21 mg/dL — ABNORMAL HIGH (ref 6–20)
CALCIUM: 9.1 mg/dL (ref 8.9–10.3)
CO2: 24 mmol/L (ref 22–32)
Chloride: 104 mmol/L (ref 101–111)
Creatinine, Ser: 0.88 mg/dL (ref 0.61–1.24)
GFR calc Af Amer: >60 mL/min (ref >60)
GFR calc non Af Amer: >60 mL/min (ref >60)
GLUCOSE: 103 mg/dL — AB (ref 65–99)
POTASSIUM: 4 mmol/L (ref 3.5–5.1)
Sodium: 137 mmol/L (ref 135–145)

## 2016-06-09 MED ORDER — ALBUTEROL SULFATE (2.5 MG/3ML) 0.083% IN NEBU
5.0000 mg | INHALATION_SOLUTION | Freq: Once | RESPIRATORY_TRACT | Status: AC
  Administered 2016-06-09: 5 mg via RESPIRATORY_TRACT
  Filled 2016-06-09: qty 6

## 2016-06-09 MED ORDER — PREDNISONE 20 MG PO TABS
60.0000 mg | ORAL_TABLET | Freq: Once | ORAL | Status: AC
  Administered 2016-06-09: 60 mg via ORAL
  Filled 2016-06-09: qty 3

## 2016-06-09 MED ORDER — PREDNISONE 10 MG (21) PO TBPK: 10.0000 mg | ORAL_TABLET | Freq: Every day | ORAL | 0 refills | Status: DC

## 2016-06-09 MED ORDER — IBUPROFEN 800 MG PO TABS
800.0000 mg | ORAL_TABLET | Freq: Once | ORAL | Status: AC
  Administered 2016-06-09: 800 mg via ORAL
  Filled 2016-06-09: qty 1

## 2016-06-09 MED ORDER — IPRATROPIUM BROMIDE 0.02 % IN SOLN
0.5000 mg | Freq: Once | RESPIRATORY_TRACT | Status: AC
  Administered 2016-06-09: 0.5 mg via RESPIRATORY_TRACT
  Filled 2016-06-09: qty 2.5
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
## 2016-06-09 NOTE — ED Triage Notes (Signed)
Pt with hx of asthma.  Pt has had increase SOB since yesterday. Tx not working at home.

## 2016-06-09 NOTE — Discharge Instructions (Signed)
Return to the ED with any concerns including difficulty breathing despite using albuterol every 4 hours, not drinking fluids, decreased urine output, vomiting and not able to keep down liquids or medications, decreased level of alertness/lethargy, or any other alarming symptoms °

## 2016-06-09 NOTE — ED Notes (Signed)
Patient denies pain, states he was  SOB but feels "like a whole different person now" states the breathing treatment helped a lot.

## 2016-07-04 ENCOUNTER — Emergency Department (HOSPITAL_COMMUNITY)
Admission: EM | Admit: 2016-07-04 | Discharge: 2016-07-04 | Disposition: A | Discharge status: Home / Self Care | Payer: Medicare Other | Attending: Emergency Medicine | Admitting: Emergency Medicine

## 2016-07-04 ENCOUNTER — Encounter (HOSPITAL_COMMUNITY): Payer: Self-pay | Admitting: Emergency Medicine

## 2016-07-04 DIAGNOSIS — I10 Essential (primary) hypertension: Secondary | ICD-10-CM | POA: Diagnosis not present

## 2016-07-04 DIAGNOSIS — Z79899 Other long term (current) drug therapy: Secondary | ICD-10-CM | POA: Diagnosis not present

## 2016-07-04 DIAGNOSIS — J449 Chronic obstructive pulmonary disease, unspecified: Secondary | ICD-10-CM | POA: Diagnosis not present

## 2016-07-04 DIAGNOSIS — R0602 Shortness of breath: Secondary | ICD-10-CM | POA: Diagnosis present

## 2016-07-04 NOTE — ED Notes (Signed)
MD at bedside. 

## 2016-07-04 NOTE — ED Triage Notes (Signed)
Pt BIB EMS from home; c/o SOB that's been worsening since this AM; hx of asthma and COPD; pt tried inhaler at home without much relief; EMS administered 5mg  albuterol and 522mcg atrovent breathing treatments twice; 125mg  solumedrol administered as well; wheezing decreased and SpO2 improved from 92% to 100% in response to medications; pt exhibiting minor labored breathing in triage; denies pain
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
## 2016-07-04 NOTE — ED Notes (Signed)
Bed: [facility]01 Expected date:  Expected time:  Means of arrival:  Comments: 65yo M abd pain

## 2016-07-04 NOTE — Discharge Instructions (Signed)
Continue using your regular medications, as needed, and as directed.  Consider using an antihistamine to see if it helps.

## 2016-07-21 IMAGING — DX DG WRIST COMPLETE 3+V*L*
4 series · 4 of 4 positions shown · non-contrast
Comparison: None.

CLINICAL DATA: Pain post fall this morning

EXAM:
LEFT WRIST - COMPLETE 3+ VIEW

[wrist ap]
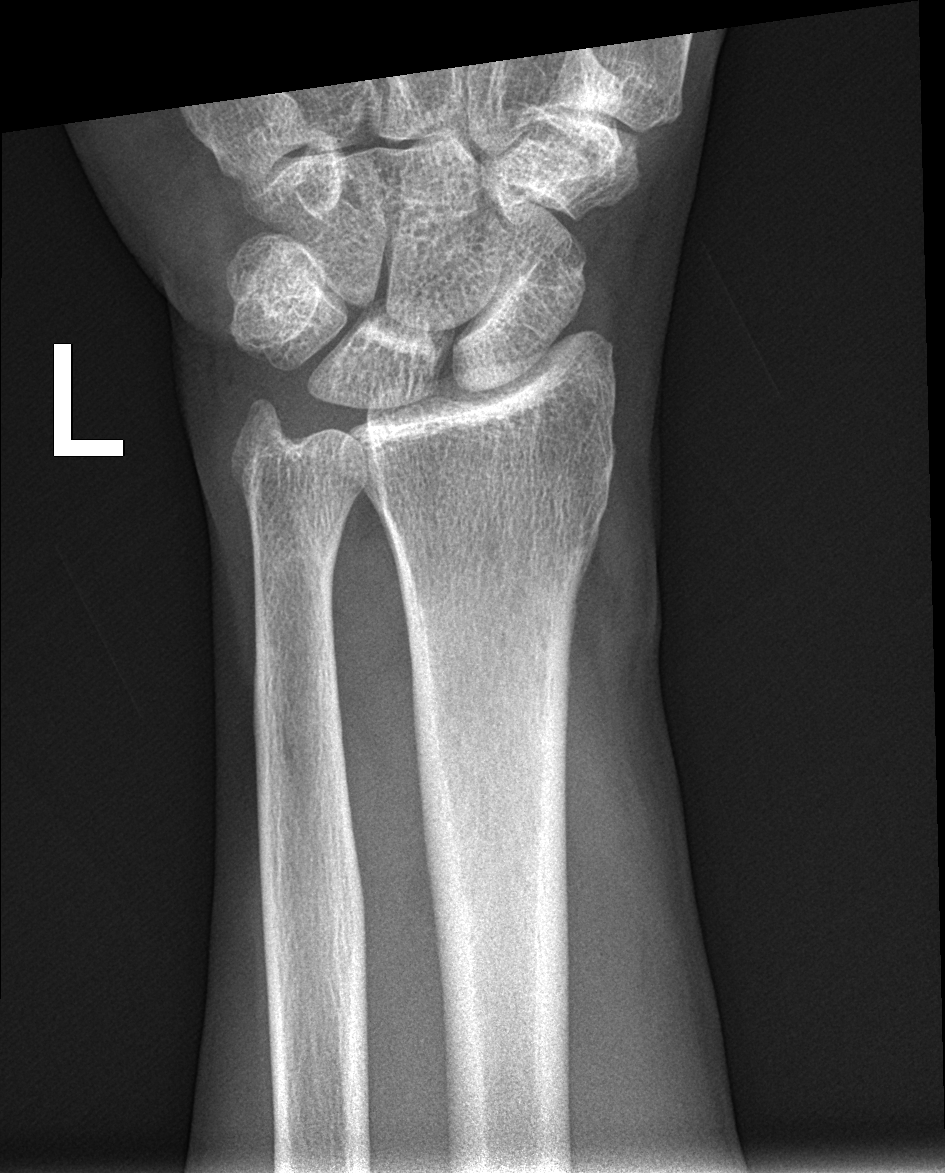

[wrist obl]
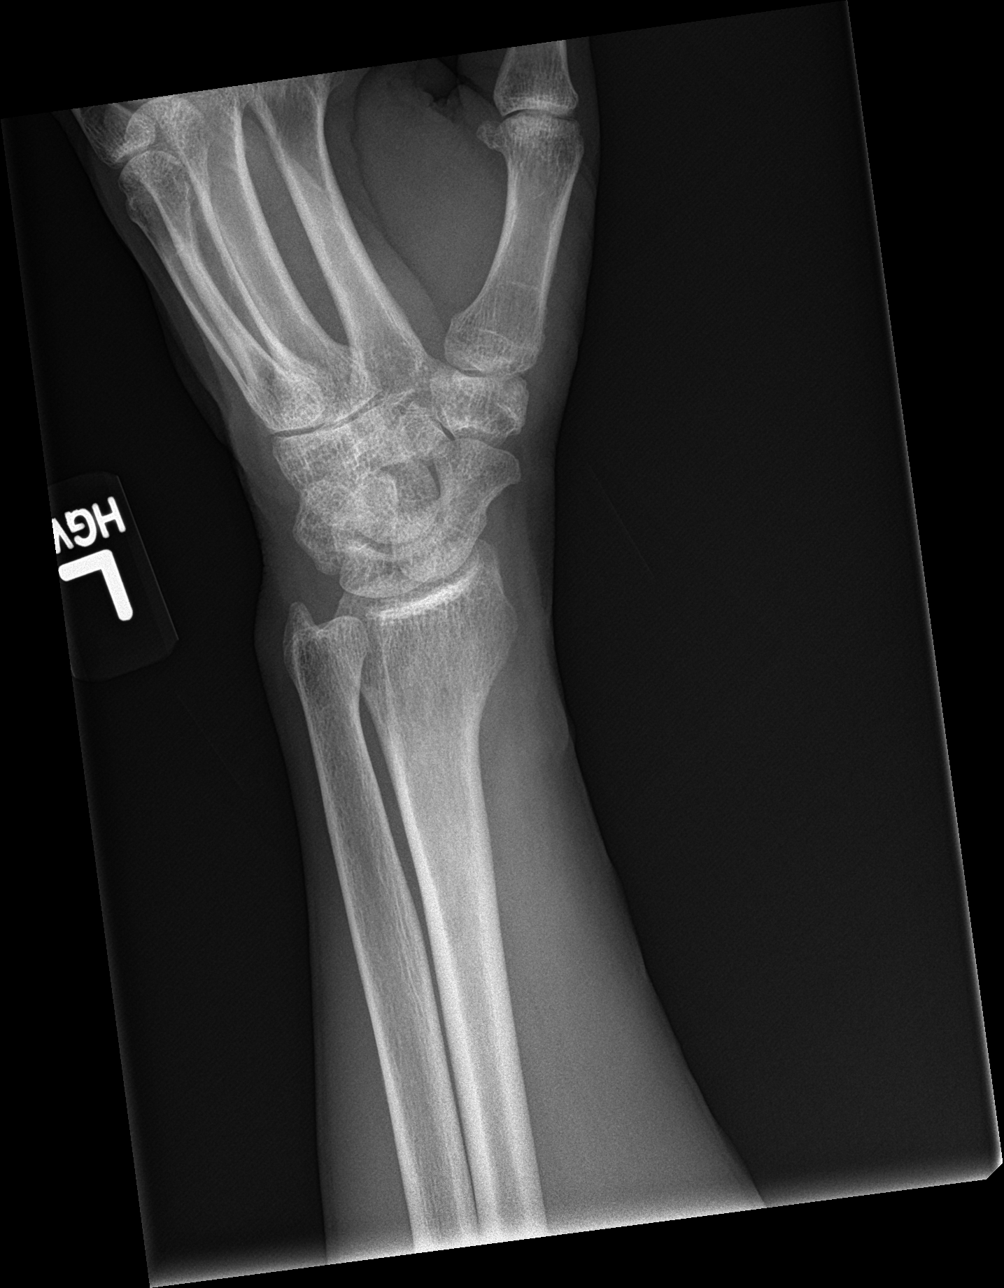

[wrist lat]
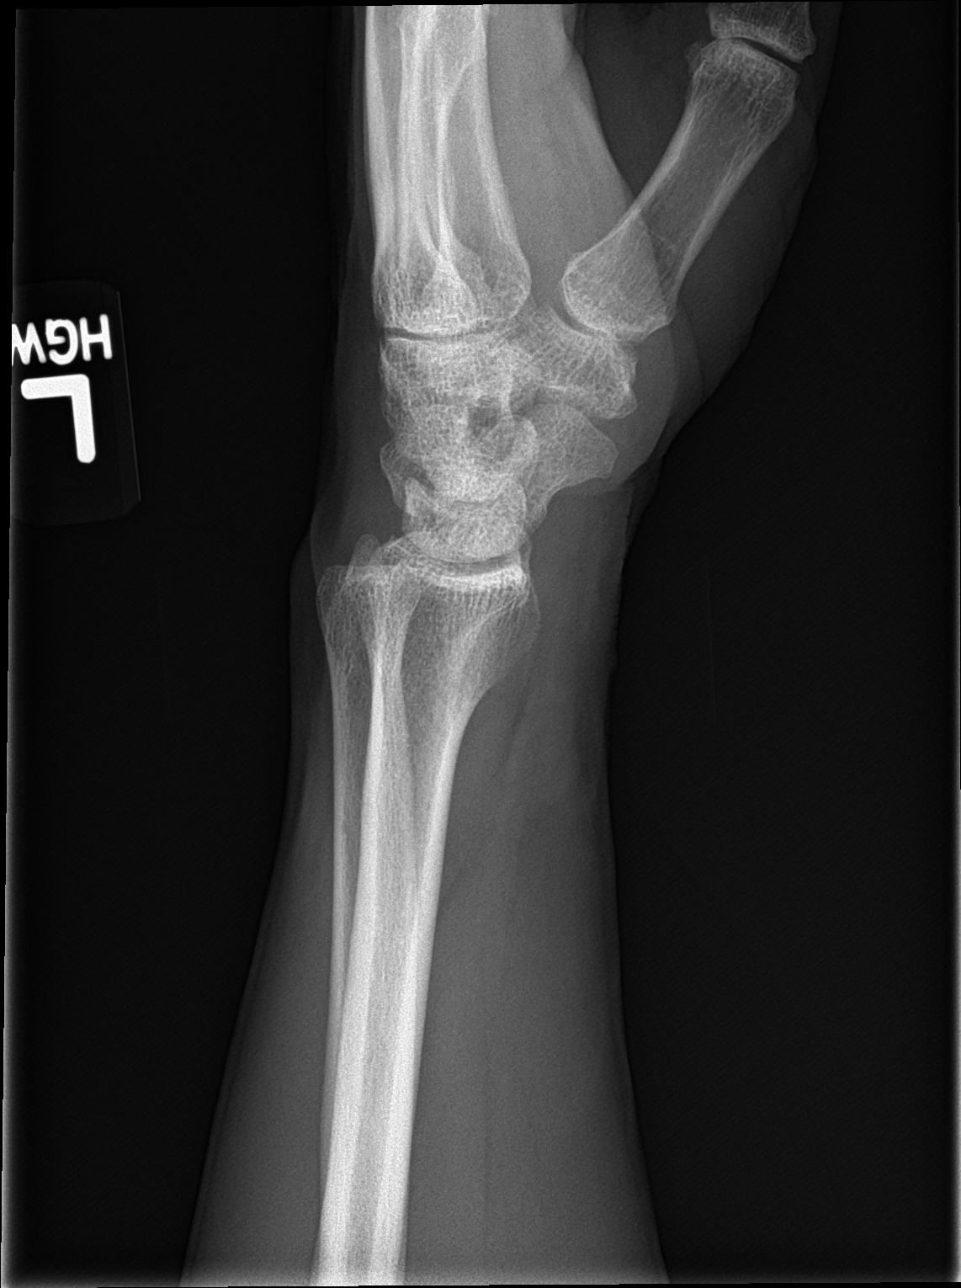

[wrist navicular]
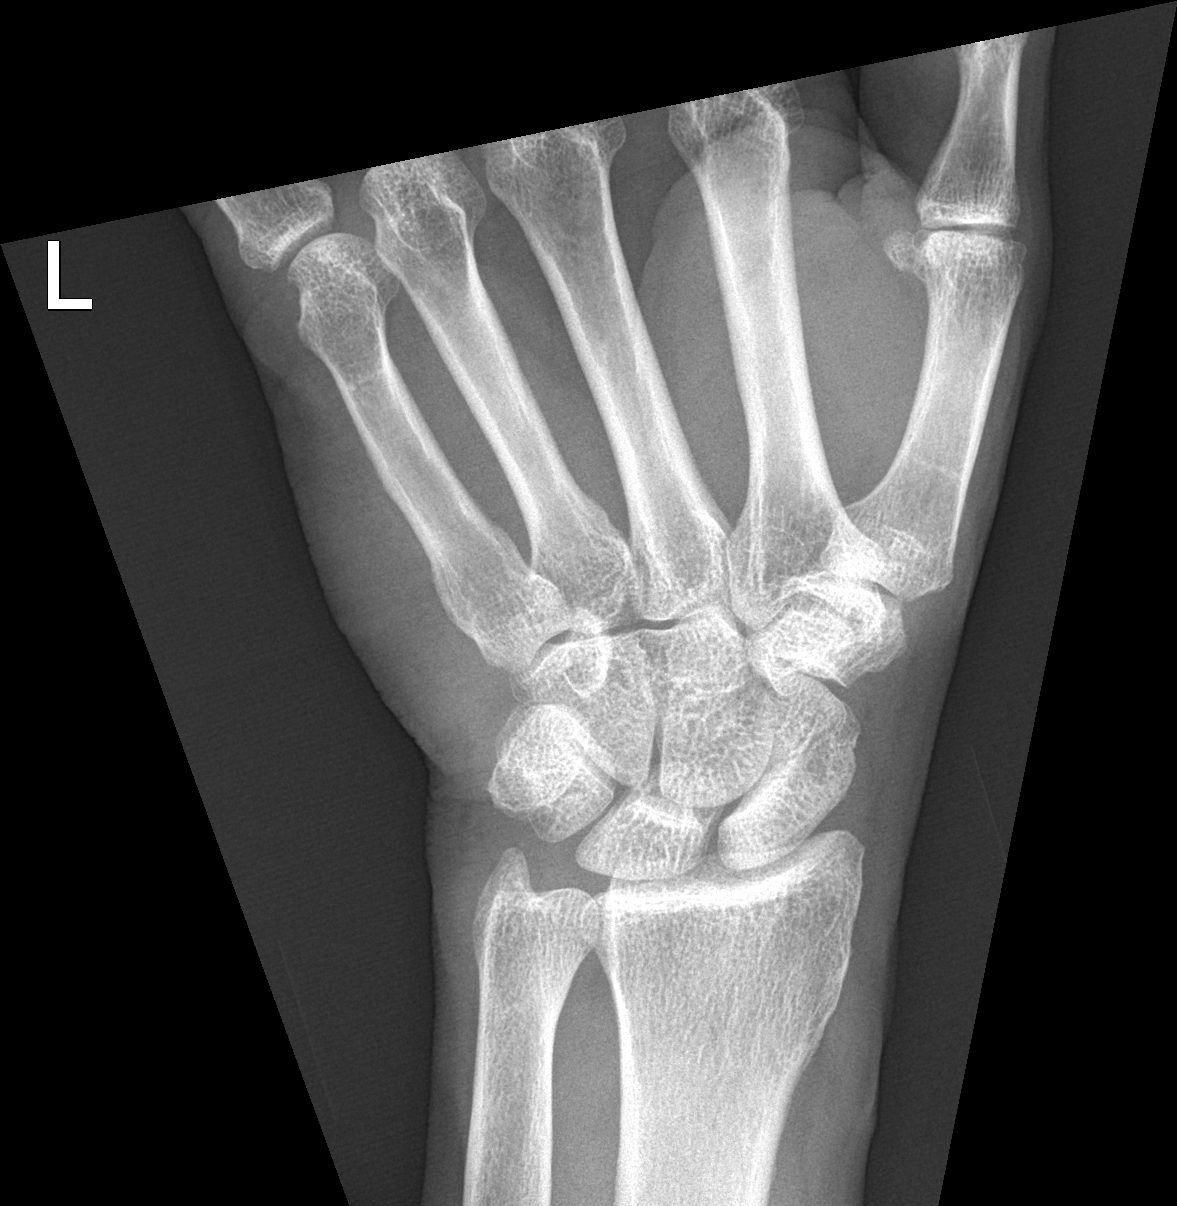

[4 of 4 positions shown; findings below may reference images not displayed]

FINDINGS: Four views of left wrist submitted. No acute fracture or
subluxation. No radiopaque foreign body.
IMPRESSION: Negative.

## 2016-07-21 IMAGING — DX DG HUMERUS 2V *L*
2 series · 2 of 2 positions shown · non-contrast
Comparison: 06/30/2011

CLINICAL DATA: Pain post fall this morning, left humerus pain

EXAM:
LEFT HUMERUS - 2+ VIEW

[humerus ap]
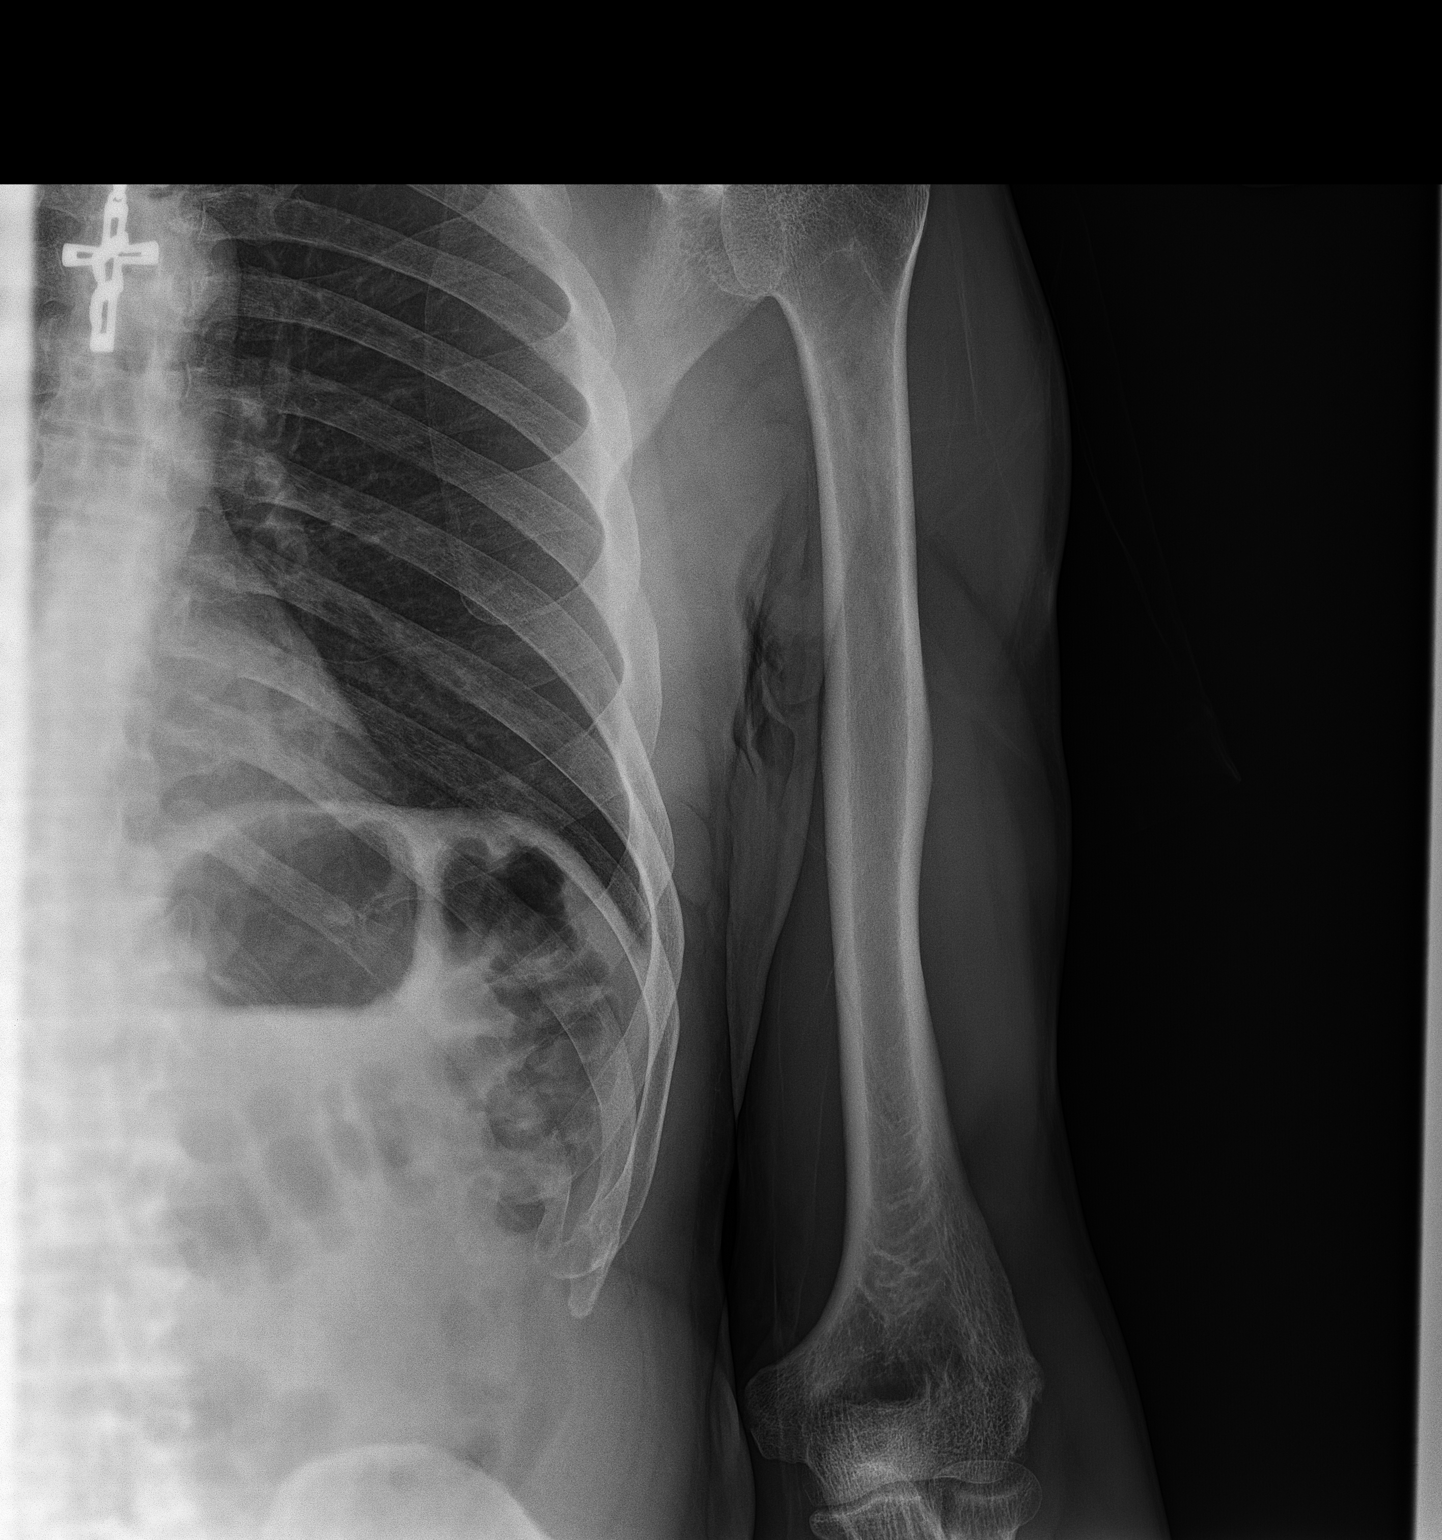

[humerus lat]
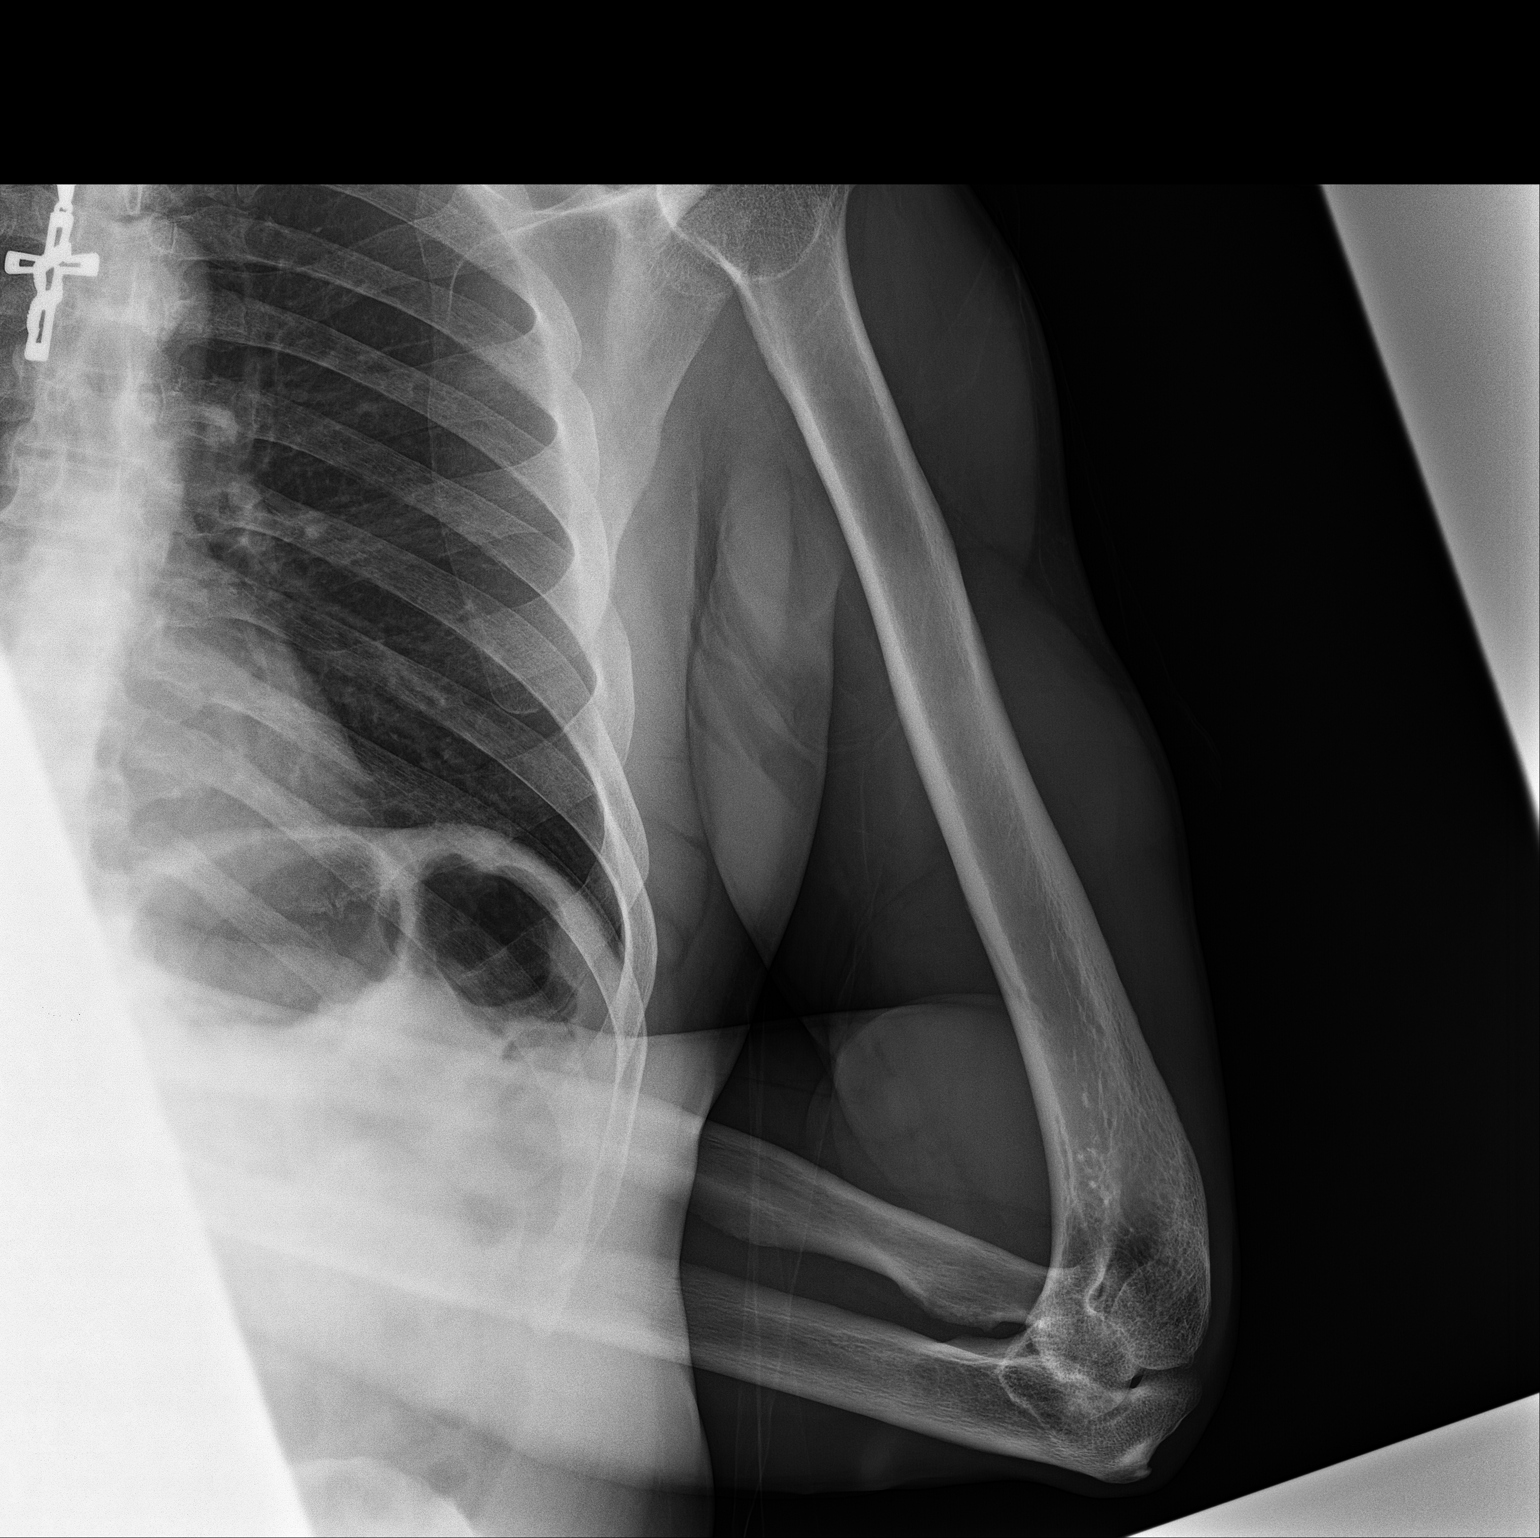

[2 of 2 positions shown; findings below may reference images not displayed]

FINDINGS: Two views of left humerus submitted. No acute fracture or
subluxation.
IMPRESSION: Negative.

## 2016-08-31 ENCOUNTER — Encounter: Payer: Self-pay | Admitting: Internal Medicine

## 2016-08-31 ENCOUNTER — Telehealth: Payer: Self-pay | Admitting: *Deleted

## 2016-08-31 ENCOUNTER — Ambulatory Visit (INDEPENDENT_AMBULATORY_CARE_PROVIDER_SITE_OTHER): Payer: Medicare Other | Admitting: Internal Medicine

## 2016-08-31 VITALS — BP 122/80 | HR 68 | Ht 66.0 in | Wt 150.8 lb

## 2016-08-31 DIAGNOSIS — R058 Other specified cough: Secondary | ICD-10-CM

## 2016-08-31 DIAGNOSIS — I10 Essential (primary) hypertension: Secondary | ICD-10-CM | POA: Diagnosis not present

## 2016-08-31 DIAGNOSIS — R05 Cough: Secondary | ICD-10-CM

## 2016-08-31 MED ORDER — VALSARTAN-HYDROCHLOROTHIAZIDE 80-12.5 MG PO TABS: 1.0000 | ORAL_TABLET | Freq: Every day | ORAL | 11 refills | Status: DC

## 2016-08-31 MED ORDER — AZITHROMYCIN 250 MG PO TABS: ORAL_TABLET | ORAL | 0 refills | Status: DC

## 2016-08-31 MED ORDER — PREDNISONE 10 MG PO TABS: ORAL_TABLET | ORAL | 0 refills | Status: DC

## 2016-08-31 NOTE — Patient Instructions (Addendum)
Stop symbicort, lisinopril, spiriva and proair  Change prilosec (omeprazole) Take 30- 60 min before your first and last meals of the day   zpak  Prednisone 10 mg take  4 each am x 2 days,   2 each am x 2 days,  1 each am x 2 days and stop    Only use your albuterol as a rescue medication to be used if you can't catch your breath by resting or doing a relaxed purse lip breathing pattern.  - The less you use it, the better it will work when you need it. - Ok to use up to    every 4 hours if you must but call for immediate appointment if use goes up over your usual need - Don't leave home without it !!  (think of it like the spare tire for your car)   Please schedule a follow up office visit in 4 weeks, sooner if needed   .

## 2016-08-31 NOTE — Telephone Encounter (Signed)
Spoke with the pt and notified needs to have PFT's done before next appt  Nothing further needed Order sent to [facility]

## 2016-08-31 NOTE — Assessment & Plan Note (Signed)
In the best review of chronic cough to date ( NEJM 2016 375 [phone]) ,  ACEi are now felt to cause cough in up to  20% of pts which is a 4 fold increase from previous reports and does not include the variety of non-specific complaints we see in pulmonary clinic in pts on ACEi but previously attributed to another dx like  Copd/asthma and  include PNDS, throat and chest congestion, "bronchitis", unexplained dyspnea and noct "strangling" sensations, and hoarseness, but also  atypical /refractory GERD symptoms like dysphagia and "bad heartburn"   The only way I know  to prove this is not an "ACEi Case" is a trial off ACEi x a minimum of 6 weeks then regroup.   Try diovan 80/12.5 q am instead of lisinopril

## 2016-08-31 NOTE — Assessment & Plan Note (Addendum)
Upper airway cough syndrome (previously labeled PNDS) , is  so named because it's frequently impossible to sort out how much is  CR/sinusitis with freq throat clearing (which can be related to primary GERD)   vs  causing  secondary (" extra esophageal")  GERD from wide swings in gastric pressure that occur with throat clearing, often  promoting self use of mint and menthol lozenges that reduce the lower esophageal sphincter tone and exacerbate the problem further in a cyclical fashion.   These are the same pts (now being labeled as having "irritable larynx syndrome" by some cough centers) who not infrequently have a history of having failed to tolerate ace inhibitors(like lisinopril),  dry powder inhalers(like spiriv) or biphosphonates or report having atypical/extraesophageal reflux symptoms that don't respond to standard doses of PPI(like low dose prilosec prn)   and are easily confused as having aecopd or asthma flares by even experienced allergists/ pulmonologists (myself included).   Will rx with pred/ zpak for flare and just use neb prn for his "copd" (note never smoked so this is probably not a tenable dx) and return if 4 weeks with pfts at [facility] first    Total time devoted to counseling  > 50 % of 60 mn office visit:  review case with pt/daugher by phone/ discussion of options/alternatives/ personally creating written customized instructions  in presence of pt  then going over those specific  Instructions directly with the pt including how to use all of the meds but in particular covering each new medication in detail and the difference between the maintenance/automatic meds and the prns using an action plan format for the latter.  Please see AVS from this visit for a full list of these instructions a
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
## 2016-08-31 NOTE — Telephone Encounter (Signed)
-----   Message from Tanda Rockers, MD sent at 08/31/2016  2:48 PM EST ----- Needs pfts at [facility] prior to ov in 4 weeks

## 2016-09-26 ENCOUNTER — Emergency Department (HOSPITAL_COMMUNITY): Payer: Medicare Other

## 2016-09-26 ENCOUNTER — Encounter (HOSPITAL_COMMUNITY): Payer: Self-pay

## 2016-09-26 ENCOUNTER — Emergency Department (HOSPITAL_COMMUNITY)
Admission: EM | Admit: 2016-09-26 | Discharge: 2016-09-26 | Disposition: A | Discharge status: Home / Self Care | Payer: Medicare Other | Attending: Emergency Medicine | Admitting: Emergency Medicine

## 2016-09-26 DIAGNOSIS — J449 Chronic obstructive pulmonary disease, unspecified: Secondary | ICD-10-CM | POA: Diagnosis not present

## 2016-09-26 DIAGNOSIS — Z79899 Other long term (current) drug therapy: Secondary | ICD-10-CM | POA: Diagnosis not present

## 2016-09-26 DIAGNOSIS — I10 Essential (primary) hypertension: Secondary | ICD-10-CM | POA: Diagnosis not present

## 2016-09-26 DIAGNOSIS — R51 Headache: Secondary | ICD-10-CM | POA: Diagnosis present

## 2016-09-26 DIAGNOSIS — J019 Acute sinusitis, unspecified: Secondary | ICD-10-CM | POA: Diagnosis not present

## 2016-09-26 LAB — CBC WITH DIFFERENTIAL/PLATELET
BASOS ABS: 0 10*3/uL (ref 0.0–0.1)
BASOS PCT: 0 %
EOS ABS: 0.6 10*3/uL (ref 0.0–0.7)
EOS PCT: 13 %
HCT: 40.6 % (ref 39.0–52.0)
HEMOGLOBIN: 14.1 g/dL (ref 13.0–17.0)
LYMPHS ABS: 1.7 10*3/uL (ref 0.7–4.0)
Lymphocytes Relative: 35 %
MCH: 31.1 pg (ref 26.0–34.0)
MCHC: 34.7 g/dL (ref 30.0–36.0)
MCV: 89.4 fL (ref 78.0–100.0)
Monocytes Absolute: 0.6 10*3/uL (ref 0.1–1.0)
Monocytes Relative: 12 %
NEUTROS PCT: 40 %
Neutro Abs: 1.9 10*3/uL (ref 1.7–7.7)
PLATELETS: 283 10*3/uL (ref 150–400)
RBC: 4.54 MIL/uL (ref 4.22–5.81)
RDW: 13.2 % (ref 11.5–15.5)
WBC: 4.8 10*3/uL (ref 4.0–10.5)

## 2016-09-26 LAB — URINALYSIS, ROUTINE W REFLEX MICROSCOPIC
BACTERIA UA: NONE SEEN
Bilirubin Urine: NEGATIVE
GLUCOSE, UA: NEGATIVE mg/dL
KETONES UR: NEGATIVE mg/dL
Leukocytes, UA: NEGATIVE
Nitrite: NEGATIVE
PROTEIN: NEGATIVE mg/dL
Specific Gravity, Urine: 1.006 (ref 1.005–1.030)
Squamous Epithelial / LPF: NONE SEEN
pH: 6 (ref 5.0–8.0)

## 2016-09-26 LAB — COMPREHENSIVE METABOLIC PANEL
ALBUMIN: 3.8 g/dL (ref 3.5–5.0)
ALT: 33 U/L (ref 17–63)
AST: 26 U/L (ref 15–41)
Alkaline Phosphatase: 53 U/L (ref 38–126)
Anion gap: 7 (ref 5–15)
BUN: 10 mg/dL (ref 6–20)
CO2: 30 mmol/L (ref 22–32)
Calcium: 8.9 mg/dL (ref 8.9–10.3)
Chloride: 100 mmol/L — ABNORMAL LOW (ref 101–111)
Creatinine, Ser: 0.73 mg/dL (ref 0.61–1.24)
GFR calc Af Amer: >60 mL/min (ref >60)
Glucose, Bld: 94 mg/dL (ref 65–99)
POTASSIUM: 3.2 mmol/L — AB (ref 3.5–5.1)
SODIUM: 137 mmol/L (ref 135–145)
Total Bilirubin: 1.2 mg/dL (ref 0.3–1.2)
Total Protein: 7.5 g/dL (ref 6.5–8.1)

## 2016-09-26 LAB — ETHANOL: Alcohol, Ethyl (B): <5 mg/dL (ref <5)

## 2016-09-26 MED ORDER — KETOROLAC TROMETHAMINE 30 MG/ML IJ SOLN
30.0000 mg | Freq: Once | INTRAMUSCULAR | Status: AC
  Administered 2016-09-26: 30 mg via INTRAVENOUS
  Filled 2016-09-26: qty 1

## 2016-09-26 MED ORDER — PROCHLORPERAZINE EDISYLATE 5 MG/ML IJ SOLN
10.0000 mg | Freq: Once | INTRAMUSCULAR | Status: AC
  Administered 2016-09-26: 10 mg via INTRAVENOUS
  Filled 2016-09-26: qty 2

## 2016-09-26 MED ORDER — SODIUM CHLORIDE 0.9 % IV BOLUS (SEPSIS)
500.0000 mL | Freq: Once | INTRAVENOUS | Status: AC
  Administered 2016-09-26: 500 mL via INTRAVENOUS

## 2016-09-26 MED ORDER — AMOXICILLIN 500 MG PO CAPS: 500.0000 mg | ORAL_CAPSULE | Freq: Three times a day (TID) | ORAL | 0 refills | Status: DC
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
## 2016-09-26 NOTE — ED Triage Notes (Signed)
Pt states that he has had a headache in the frontal area of his head since yesterday. He states that ibuprofen has not relieved it. Also states, "My breathing is bothering me too." Hx COPD. No resp distress in triage. A&Ox4. Denies N/V/D. Denies chest pain.

## 2016-09-26 NOTE — ED Notes (Signed)
Patient transported to X-ray 

## 2016-09-26 NOTE — Discharge Instructions (Signed)
Get plenty of rest and drink a lot of fluids.  For nasal congestion, use Afrin twice a day for 3 or 4 days.  For pain or fever, use Tylenol or Motrin.

## 2016-09-27 LAB — URINE CULTURE: CULTURE: NO GROWTH

## 2016-10-01 ENCOUNTER — Ambulatory Visit: Payer: Self-pay | Admitting: Internal Medicine

## 2016-10-16 ENCOUNTER — Telehealth: Payer: Self-pay | Admitting: Internal Medicine

## 2016-10-16 NOTE — Telephone Encounter (Signed)
lmomtcb x1 

## 2016-10-16 NOTE — Telephone Encounter (Signed)
Spoke with pt, who states he had an ED visit on 09/26/16 and was prescribed prednisone for sinusisit. Pt finished prednisone about 1.5wk ago. Pt c/o increased sob, prod cough with white mucus & wheezing X 2w Pt states he is doing neb treatments 5x daily with no improvement.  Pt denies any fever, chills or sweats.  Patient Instructions    Stop symbicort, lisinopril, spiriva and proair  Change prilosec (omeprazole) Take 30- 60 min before your first and last meals of the day   zpak  Prednisone 10 mg take  4 each am x 2 days,   2 each am x 2 days,  1 each am x 2 days and stop    Only use your albuterol as a rescue medication to be used if you can't catch your breath by resting or doing a relaxed purse lip breathing pattern.  - The less you use it, the better it will work when you need it. - Ok to use up to    every 4 hours if you must but call for immediate appointment if use goes up over your usual need - Don't leave home without it !!  (think of it like the spare tire for your car)   Please schedule a follow up office visit in 4 weeks, sooner if needed   .

## 2016-10-16 NOTE — Telephone Encounter (Signed)
Will need ov with all meds in hand or go back to ER as can't treat this over the phone

## 2016-10-18 ENCOUNTER — Encounter (HOSPITAL_COMMUNITY): Payer: Self-pay | Admitting: *Deleted

## 2016-10-18 ENCOUNTER — Observation Stay (HOSPITAL_COMMUNITY)
Admission: EM | Admit: 2016-10-18 | Discharge: 2016-10-20 | Disposition: A | Discharge status: Home / Self Care | Payer: Medicare Other | Attending: Internal Medicine | Admitting: Internal Medicine

## 2016-10-18 ENCOUNTER — Emergency Department (HOSPITAL_COMMUNITY): Payer: Medicare Other

## 2016-10-18 DIAGNOSIS — N400 Enlarged prostate without lower urinary tract symptoms: Secondary | ICD-10-CM | POA: Diagnosis not present

## 2016-10-18 DIAGNOSIS — J441 Chronic obstructive pulmonary disease with (acute) exacerbation: Secondary | ICD-10-CM | POA: Diagnosis not present

## 2016-10-18 DIAGNOSIS — J449 Chronic obstructive pulmonary disease, unspecified: Secondary | ICD-10-CM | POA: Diagnosis present

## 2016-10-18 DIAGNOSIS — J069 Acute upper respiratory infection, unspecified: Secondary | ICD-10-CM | POA: Diagnosis not present

## 2016-10-18 DIAGNOSIS — Z79899 Other long term (current) drug therapy: Secondary | ICD-10-CM | POA: Diagnosis not present

## 2016-10-18 DIAGNOSIS — K219 Gastro-esophageal reflux disease without esophagitis: Secondary | ICD-10-CM | POA: Insufficient documentation

## 2016-10-18 DIAGNOSIS — I10 Essential (primary) hypertension: Secondary | ICD-10-CM | POA: Diagnosis present

## 2016-10-18 DIAGNOSIS — Z87891 Personal history of nicotine dependence: Secondary | ICD-10-CM | POA: Diagnosis not present

## 2016-10-18 DIAGNOSIS — J9601 Acute respiratory failure with hypoxia: Secondary | ICD-10-CM | POA: Insufficient documentation

## 2016-10-18 DIAGNOSIS — J189 Pneumonia, unspecified organism: Secondary | ICD-10-CM | POA: Diagnosis present

## 2016-10-18 DIAGNOSIS — J181 Lobar pneumonia, unspecified organism: Secondary | ICD-10-CM | POA: Diagnosis present

## 2016-10-18 DIAGNOSIS — J45902 Unspecified asthma with status asthmaticus: Secondary | ICD-10-CM

## 2016-10-18 LAB — I-STAT ARTERIAL BLOOD GAS, ED
ACID-BASE DEFICIT: 2 mmol/L (ref 0.0–2.0)
Bicarbonate: 23.8 mmol/L (ref 20.0–28.0)
O2 Saturation: 99 %
TCO2: 25 mmol/L (ref 0–100)
pCO2 arterial: 43.7 mmHg (ref 32.0–48.0)
pH, Arterial: 7.343 — ABNORMAL LOW (ref 7.350–7.450)
pO2, Arterial: 151 mmHg — ABNORMAL HIGH (ref 83.0–108.0)

## 2016-10-18 MED ORDER — SODIUM CHLORIDE 0.9 % IV SOLN
Freq: Once | INTRAVENOUS | Status: AC
  Administered 2016-10-18: via INTRAVENOUS

## 2016-10-18 MED ORDER — ALBUTEROL (5 MG/ML) CONTINUOUS INHALATION SOLN
10.0000 mg/h | INHALATION_SOLUTION | Freq: Once | RESPIRATORY_TRACT | Status: AC
  Administered 2016-10-19: 10 mg/h via RESPIRATORY_TRACT

## 2016-10-18 MED ORDER — HYDROCODONE-HOMATROPINE 5-1.5 MG/5ML PO SYRP: 5.0000 mL | ORAL_SOLUTION | Freq: Once | ORAL | Status: DC

## 2016-10-18 MED ORDER — METHYLPREDNISOLONE SODIUM SUCC 125 MG IJ SOLR: 125.0000 mg | Freq: Once | INTRAMUSCULAR | Status: DC

## 2016-10-18 MED ORDER — IPRATROPIUM BROMIDE 0.02 % IN SOLN
0.5000 mg | Freq: Once | RESPIRATORY_TRACT | Status: AC
  Administered 2016-10-19: 0.5 mg via RESPIRATORY_TRACT
  Filled 2016-10-18: qty 2.5

## 2016-10-18 MED ORDER — MAGNESIUM SULFATE 2 GM/50ML IV SOLN
2.0000 g | INTRAVENOUS | Status: AC
  Administered 2016-10-18: 2 g via INTRAVENOUS
  Filled 2016-10-18: qty 50
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
## 2016-10-18 NOTE — ED Notes (Signed)
RT notified of CAT order.

## 2016-10-18 NOTE — ED Triage Notes (Signed)
The pt arrived by gems from home with respiratory  Distress.  More difficulty  For 2 days worse today  He received 2 deodurm  He was given  125  Solumedrol iv by ems

## 2016-10-19 ENCOUNTER — Encounter (HOSPITAL_COMMUNITY): Payer: Self-pay | Admitting: Emergency Medicine

## 2016-10-19 DIAGNOSIS — J181 Lobar pneumonia, unspecified organism: Secondary | ICD-10-CM | POA: Diagnosis not present

## 2016-10-19 DIAGNOSIS — I10 Essential (primary) hypertension: Secondary | ICD-10-CM

## 2016-10-19 DIAGNOSIS — J45902 Unspecified asthma with status asthmaticus: Secondary | ICD-10-CM

## 2016-10-19 DIAGNOSIS — J449 Chronic obstructive pulmonary disease, unspecified: Secondary | ICD-10-CM | POA: Diagnosis present

## 2016-10-19 DIAGNOSIS — J441 Chronic obstructive pulmonary disease with (acute) exacerbation: Secondary | ICD-10-CM | POA: Diagnosis not present

## 2016-10-19 LAB — BASIC METABOLIC PANEL
Anion gap: 10 (ref 5–15)
BUN: 14 mg/dL (ref 6–20)
CO2: 26 mmol/L (ref 22–32)
CREATININE: 0.77 mg/dL (ref 0.61–1.24)
Calcium: 8.9 mg/dL (ref 8.9–10.3)
Chloride: 99 mmol/L — ABNORMAL LOW (ref 101–111)
GFR calc Af Amer: >60 mL/min (ref >60)
Glucose, Bld: 139 mg/dL — ABNORMAL HIGH (ref 65–99)
Potassium: 4.1 mmol/L (ref 3.5–5.1)
SODIUM: 135 mmol/L (ref 135–145)

## 2016-10-19 LAB — RESPIRATORY PANEL BY PCR
Adenovirus: NOT DETECTED
BORDETELLA PERTUSSIS-RVPCR: NOT DETECTED
CHLAMYDOPHILA PNEUMONIAE-RVPPCR: NOT DETECTED
CORONAVIRUS 229E-RVPPCR: NOT DETECTED
CORONAVIRUS HKU1-RVPPCR: NOT DETECTED
Coronavirus NL63: NOT DETECTED
Coronavirus OC43: DETECTED — AB
Influenza A: NOT DETECTED
Influenza B: NOT DETECTED
MYCOPLASMA PNEUMONIAE-RVPPCR: NOT DETECTED
Metapneumovirus: NOT DETECTED
Parainfluenza Virus 1: NOT DETECTED
Parainfluenza Virus 2: NOT DETECTED
Parainfluenza Virus 3: NOT DETECTED
Parainfluenza Virus 4: NOT DETECTED
Respiratory Syncytial Virus: NOT DETECTED
Rhinovirus / Enterovirus: NOT DETECTED

## 2016-10-19 LAB — I-STAT CG4 LACTIC ACID, ED: Lactic Acid, Venous: 2.38 mmol/L (ref 0.5–1.9)

## 2016-10-19 LAB — CBC
HCT: 39.8 % (ref 39.0–52.0)
Hemoglobin: 13.7 g/dL (ref 13.0–17.0)
MCH: 31.3 pg (ref 26.0–34.0)
MCHC: 34.4 g/dL (ref 30.0–36.0)
MCV: 90.9 fL (ref 78.0–100.0)
PLATELETS: 289 10*3/uL (ref 150–400)
RBC: 4.38 MIL/uL (ref 4.22–5.81)
RDW: 13.2 % (ref 11.5–15.5)
WBC: 5.1 10*3/uL (ref 4.0–10.5)

## 2016-10-19 LAB — CBC WITH DIFFERENTIAL/PLATELET
BASOS PCT: 1 %
Basophils Absolute: 0.1 10*3/uL (ref 0.0–0.1)
EOS ABS: 0.9 10*3/uL — AB (ref 0.0–0.7)
Eosinophils Relative: 15 %
HCT: 40.3 % (ref 39.0–52.0)
HEMOGLOBIN: 13.6 g/dL (ref 13.0–17.0)
LYMPHS ABS: 2.8 10*3/uL (ref 0.7–4.0)
Lymphocytes Relative: 47 %
MCH: 30.6 pg (ref 26.0–34.0)
MCHC: 33.7 g/dL (ref 30.0–36.0)
MCV: 90.6 fL (ref 78.0–100.0)
MONO ABS: 0.4 10*3/uL (ref 0.1–1.0)
MONOS PCT: 7 %
NEUTROS PCT: 30 %
Neutro Abs: 1.8 10*3/uL (ref 1.7–7.7)
PLATELETS: 266 10*3/uL (ref 150–400)
RBC: 4.45 MIL/uL (ref 4.22–5.81)
RDW: 13.5 % (ref 11.5–15.5)
WBC: 5.9 10*3/uL (ref 4.0–10.5)

## 2016-10-19 LAB — I-STAT CHEM 8, ED
BUN: 15 mg/dL (ref 6–20)
CREATININE: 0.8 mg/dL (ref 0.61–1.24)
Calcium, Ion: 1.14 mmol/L — ABNORMAL LOW (ref 1.15–1.40)
Chloride: 102 mmol/L (ref 101–111)
GLUCOSE: 93 mg/dL (ref 65–99)
HCT: 42 % (ref 39.0–52.0)
Hemoglobin: 14.3 g/dL (ref 13.0–17.0)
POTASSIUM: 3.5 mmol/L (ref 3.5–5.1)
Sodium: 138 mmol/L (ref 135–145)
TCO2: 25 mmol/L (ref 0–100)

## 2016-10-19 LAB — INFLUENZA PANEL BY PCR (TYPE A & B)
INFLAPCR: NEGATIVE
INFLBPCR: NEGATIVE

## 2016-10-19 LAB — BRAIN NATRIURETIC PEPTIDE: B NATRIURETIC PEPTIDE 5: 11.5 pg/mL (ref 0.0–100.0)

## 2016-10-19 LAB — LACTIC ACID, PLASMA: Lactic Acid, Venous: 1.2 mmol/L (ref 0.5–1.9)

## 2016-10-19 LAB — PROCALCITONIN: Procalcitonin: <0.1 ng/mL

## 2016-10-19 MED ORDER — OSELTAMIVIR PHOSPHATE 75 MG PO CAPS: 75.0000 mg | ORAL_CAPSULE | Freq: Once | ORAL | Status: DC

## 2016-10-19 MED ORDER — ONDANSETRON HCL 4 MG/2ML IJ SOLN: 4.0000 mg | Freq: Four times a day (QID) | INTRAMUSCULAR | Status: DC | PRN

## 2016-10-19 MED ORDER — LORATADINE 10 MG PO TABS
10.0000 mg | ORAL_TABLET | Freq: Every day | ORAL | Status: DC
  Administered 2016-10-19 – 2016-10-20 (×2): 10 mg via ORAL
  Filled 2016-10-19 (×2): qty 1

## 2016-10-19 MED ORDER — SODIUM CHLORIDE 0.9 % IV SOLN
INTRAVENOUS | Status: DC
  Administered 2016-10-19 – 2016-10-20 (×2): via INTRAVENOUS

## 2016-10-19 MED ORDER — MONTELUKAST SODIUM 10 MG PO TABS
10.0000 mg | ORAL_TABLET | Freq: Every day | ORAL | Status: DC
  Administered 2016-10-19: 10 mg via ORAL
  Filled 2016-10-19: qty 1

## 2016-10-19 MED ORDER — DEXTROSE 5 % IV SOLN
500.0000 mg | Freq: Once | INTRAVENOUS | Status: AC
  Administered 2016-10-19: 500 mg via INTRAVENOUS
  Filled 2016-10-19: qty 500

## 2016-10-19 MED ORDER — HYDROCHLOROTHIAZIDE 12.5 MG PO CAPS
12.5000 mg | ORAL_CAPSULE | Freq: Every day | ORAL | Status: DC
  Administered 2016-10-19 – 2016-10-20 (×2): 12.5 mg via ORAL
  Filled 2016-10-19 (×2): qty 1

## 2016-10-19 MED ORDER — PANTOPRAZOLE SODIUM 40 MG PO TBEC
40.0000 mg | DELAYED_RELEASE_TABLET | Freq: Every day | ORAL | Status: DC
  Administered 2016-10-19 – 2016-10-20 (×2): 40 mg via ORAL
  Filled 2016-10-19 (×2): qty 1

## 2016-10-19 MED ORDER — ALBUTEROL SULFATE (2.5 MG/3ML) 0.083% IN NEBU
2.5000 mg | INHALATION_SOLUTION | Freq: Four times a day (QID) | RESPIRATORY_TRACT | Status: DC
  Administered 2016-10-19 (×2): 2.5 mg via RESPIRATORY_TRACT
  Filled 2016-10-19 (×3): qty 3

## 2016-10-19 MED ORDER — ENOXAPARIN SODIUM 40 MG/0.4ML ~~LOC~~ SOLN
40.0000 mg | Freq: Every day | SUBCUTANEOUS | Status: DC
  Administered 2016-10-19 – 2016-10-20 (×2): 40 mg via SUBCUTANEOUS
  Filled 2016-10-19 (×2): qty 0.4

## 2016-10-19 MED ORDER — GUAIFENESIN-DM 100-10 MG/5ML PO SYRP
5.0000 mL | ORAL_SOLUTION | ORAL | Status: DC | PRN
  Administered 2016-10-19: 5 mL via ORAL
  Filled 2016-10-19: qty 5

## 2016-10-19 MED ORDER — DEXTROSE 5 % IV SOLN
1.0000 g | Freq: Once | INTRAVENOUS | Status: AC
  Administered 2016-10-19: 1 g via INTRAVENOUS
  Filled 2016-10-19: qty 10

## 2016-10-19 MED ORDER — ACETAMINOPHEN 650 MG RE SUPP: 650.0000 mg | Freq: Four times a day (QID) | RECTAL | Status: DC | PRN

## 2016-10-19 MED ORDER — AZITHROMYCIN 500 MG PO TABS
500.0000 mg | ORAL_TABLET | ORAL | Status: DC
  Administered 2016-10-19: 500 mg via ORAL
  Filled 2016-10-19: qty 1

## 2016-10-19 MED ORDER — PREDNISONE 20 MG PO TABS
40.0000 mg | ORAL_TABLET | Freq: Every day | ORAL | Status: DC
  Administered 2016-10-19: 40 mg via ORAL
  Filled 2016-10-19: qty 2

## 2016-10-19 MED ORDER — ALBUTEROL SULFATE (2.5 MG/3ML) 0.083% IN NEBU
2.5000 mg | INHALATION_SOLUTION | RESPIRATORY_TRACT | Status: DC | PRN
  Administered 2016-10-19: 2.5 mg via RESPIRATORY_TRACT
  Filled 2016-10-19: qty 3

## 2016-10-19 MED ORDER — ONDANSETRON HCL 4 MG PO TABS: 4.0000 mg | ORAL_TABLET | Freq: Four times a day (QID) | ORAL | Status: DC | PRN

## 2016-10-19 MED ORDER — VALSARTAN-HYDROCHLOROTHIAZIDE 80-12.5 MG PO TABS: 1.0000 | ORAL_TABLET | Freq: Every day | ORAL | Status: DC

## 2016-10-19 MED ORDER — FINASTERIDE 5 MG PO TABS
5.0000 mg | ORAL_TABLET | Freq: Two times a day (BID) | ORAL | Status: DC
  Administered 2016-10-19 – 2016-10-20 (×3): 5 mg via ORAL
  Filled 2016-10-19 (×3): qty 1

## 2016-10-19 MED ORDER — DEXTROSE 5 % IV SOLN: 1.0000 g | INTRAVENOUS | Status: DC

## 2016-10-19 MED ORDER — METHYLPREDNISOLONE SODIUM SUCC 125 MG IJ SOLR
60.0000 mg | Freq: Three times a day (TID) | INTRAMUSCULAR | Status: DC
  Administered 2016-10-19 (×2): 60 mg via INTRAVENOUS
  Filled 2016-10-19 (×2): qty 2

## 2016-10-19 MED ORDER — IRBESARTAN 75 MG PO TABS
75.0000 mg | ORAL_TABLET | Freq: Every day | ORAL | Status: DC
  Administered 2016-10-19 – 2016-10-20 (×2): 75 mg via ORAL
  Filled 2016-10-19 (×2): qty 1

## 2016-10-19 MED ORDER — TERAZOSIN HCL 5 MG PO CAPS
5.0000 mg | ORAL_CAPSULE | Freq: Every day | ORAL | Status: DC
Start: 2016-10-19 — End: 2016-10-20
  Administered 2016-10-19 – 2016-10-20 (×2): 5 mg via ORAL
  Filled 2016-10-19 (×2): qty 1

## 2016-10-19 MED ORDER — ACETAMINOPHEN 325 MG PO TABS: 650.0000 mg | ORAL_TABLET | Freq: Four times a day (QID) | ORAL | Status: DC | PRN

## 2016-10-19 NOTE — ED Notes (Signed)
Admitting MD/hospitalist at bedside.

## 2016-10-19 NOTE — ED Notes (Signed)
RT at bedside.

## 2016-10-19 NOTE — ED Provider Notes (Signed)
Medical screening examination/treatment/procedure(s) were conducted as a shared visit with non-physician practitioner(s) and myself.  I personally evaluated the patient during the encounter.   EKG Interpretation  Date/Time:  Sunday October 18 2016 23:34:23 EST Ventricular Rate:  87 PR Interval:    QRS Duration: 89 QT Interval:  357 QTC Calculation: 430 R Axis:   65 Text Interpretation:  Sinus rhythm LAE, consider biatrial enlargement Anteroseptal infarct, old No significant change since last tracing Reconfirmed by WARD,  DO, KRISTEN ([id number]) on 10/18/2016 11:39:30 PM      Pt is a 66 y.o. male with history of COPD who presents to the emergency department with moderate respiratory distress. Suspect COPD exacerbation with superimposed pneumonia. Reports change in his cough and sputum production. Lactate mildly elevated. ABG reassuring. Patient improving on BiPAP. Patient received antibiotics for community-acquired pneumonia and will be admitted.   [place], DO 10/19/16 [phone]

## 2016-10-19 NOTE — Telephone Encounter (Signed)
Per pt's chart, he has been admitted since 10/18/16. Message will be closed.

## 2016-10-19 NOTE — Progress Notes (Signed)
PROGRESS NOTE                                                                                                                                                                                                             Patient Demographics:    [name] [name], is a 66 y.o. male, DOB - [birth year], [id number]  Admit date - 10/18/2016   Admitting Physician Edwin Dada, MD  Outpatient Primary MD for the patient is [name], [name], NP  LOS - 0  Chief Complaint  Patient presents with  . Respiratory Distress       Brief Narrative  [name] [name] is a 65 y.o. male with a past medical history significant for COPD without PFTs, HTN who presented with acute Hypoxic respiratory failure due to COPD exacerbation.    Subjective:    [name] today has, No headache, No chest pain, No abdominal pain - No Nausea, No new weakness tingling or numbness, No Cough - improved SOB.     Assessment  & Plan :     1. Acute hypoxic respiratory failure due to COPD exacerbation. No pneumonia. He is still hypoxic and requiring oxygen, placed him on IV steroids, continue azithromycin, continue nebulizer treatments along with supportive care. Added flutter valve. Stop Rocephin.  2. Essential hypertension. Stable no acute issues continue combination of ARB and HCTZ.  3. GERD. On PPI continue.  4. BPH. Continue Proscar.    Diet : Diet regular Room service appropriate? Yes; Fluid consistency: Thin    Family Communication  :  none  Code Status :  Full  Disposition Plan  :  Home on am  Consults  :  None  Procedures  :  None  DVT Prophylaxis  :  Lovenox    Lab Results  Component Value Date   PLT 289 10/19/2016    Inpatient Medications  Scheduled Meds: . albuterol  2.5 mg Nebulization Q6H  . azithromycin  500 mg Oral Q24H  . enoxaparin (LOVENOX) injection  40 mg Subcutaneous Daily  . finasteride  5 mg Oral BID    . irbesartan  75 mg Oral Daily   And  . hydrochlorothiazide  12.5 mg Oral Daily  . loratadine  10 mg Oral Daily  . methylPREDNISolone (SOLU-MEDROL) injection  60 mg Intravenous TID  . montelukast  10 mg Oral  QHS  . pantoprazole  40 mg Oral Daily  . terazosin  5 mg Oral Daily   Continuous Infusions: . sodium chloride     PRN Meds:.albuterol, guaiFENesin-dextromethorphan, ondansetron **OR** ondansetron (ZOFRAN) IV  Antibiotics  :    Anti-infectives    Start     Dose/Rate Route Frequency Ordered Stop   10/20/16 2200  cefTRIAXone (ROCEPHIN) 1 g in dextrose 5 % 50 mL IVPB  Status:  Discontinued     1 g 100 mL/hr over 30 Minutes Intravenous Every 24 hours 10/19/16 0704 10/19/16 1209   10/19/16 2200  azithromycin (ZITHROMAX) tablet 500 mg     500 mg Oral Every 24 hours 10/19/16 0704 10/26/16 2159   10/19/16 0200  oseltamivir (TAMIFLU) capsule 75 mg  Status:  Discontinued     75 mg Oral  Once 10/19/16 0151 10/19/16 0214   10/19/16 0015  cefTRIAXone (ROCEPHIN) 1 g in dextrose 5 % 50 mL IVPB     1 g 100 mL/hr over 30 Minutes Intravenous  Once 10/19/16 0007 10/19/16 0148   10/19/16 0015  azithromycin (ZITHROMAX) 500 mg in dextrose 5 % 250 mL IVPB     500 mg 250 mL/hr over 60 Minutes Intravenous  Once 10/19/16 0007 10/19/16 0116         Objective:   Vitals:   10/19/16 0240 10/19/16 0300 10/19/16 0428 10/19/16 0700  BP:  134/86 (!) 157/84   Pulse:  76 77   Resp:  24 (!) 25   Temp: 97.8 F (36.6 C)  97.7 F (36.5 C)   TempSrc: Oral  Oral   SpO2:  97% 99% 98%  Weight:      Height:        Wt Readings from Last 3 Encounters:  10/18/16 66.7 kg (147 lb)  08/31/16 68.4 kg (150 lb 12.8 oz)  05/09/16 65.7 kg (144 lb 12.8 oz)     Intake/Output Summary (Last 24 hours) at 10/19/16 1209 Last data filed at 10/19/16 0316  Gross per 24 hour  Intake              850 ml  Output                0 ml  Net              850 ml     Physical Exam  Awake Alert, Oriented X 3, No new  F.N deficits, Normal affect [place].AT,PERRAL Supple Neck,No JVD, No cervical lymphadenopathy appriciated.  Symmetrical Chest wall movement, Mod air movement bilaterally, mild wheezing RRR,No Gallops,Rubs or new Murmurs, No Parasternal Heave +ve B.Sounds, Abd Soft, No tenderness, No organomegaly appriciated, No rebound - guarding or rigidity. No Cyanosis, Clubbing or edema, No new Rash or bruise       Data Review:    CBC  Recent Labs Lab 10/18/16 2357 10/19/16 0015 10/19/16 0727  WBC 5.9  --  5.1  HGB 13.6 14.3 13.7  HCT 40.3 42.0 39.8  PLT 266  --  289  MCV 90.6  --  90.9  MCH 30.6  --  31.3  MCHC 33.7  --  34.4  RDW 13.5  --  13.2  LYMPHSABS 2.8  --   --   MONOABS 0.4  --   --   EOSABS 0.9*  --   --   BASOSABS 0.1  --   --     Chemistries   Recent Labs Lab 10/19/16 0015 10/19/16 0727  NA 138 135  K  3.5 4.1  CL 102 99*  CO2  --  26  GLUCOSE 93 139*  BUN 15 14  CREATININE 0.80 0.77  CALCIUM  --  8.9   ------------------------------------------------------------------------------------------------------------------ No results for input(s): CHOL, HDL, LDLCALC, TRIG, CHOLHDL, LDLDIRECT in the last 72 hours.  Lab Results  Component Value Date   HGBA1C 5.9 (H) 12/24/2011   ------------------------------------------------------------------------------------------------------------------ No results for input(s): TSH, T4TOTAL, T3FREE, THYROIDAB in the last 72 hours.  Invalid input(s): FREET3 ------------------------------------------------------------------------------------------------------------------ No results for input(s): VITAMINB12, FOLATE, FERRITIN, TIBC, IRON, RETICCTPCT in the last 72 hours.  Coagulation profile No results for input(s): INR, PROTIME in the last 168 hours.  No results for input(s): DDIMER in the last 72 hours.  Cardiac Enzymes No results for input(s): CKMB, TROPONINI, MYOGLOBIN in the last 168 hours.  Invalid input(s):  CK ------------------------------------------------------------------------------------------------------------------    Component Value Date/Time   BNP 11.5 10/19/2016 0727    Micro Results No results found for this or any previous visit (from the past 240 hour(s)).  Radiology Reports Ct Head Wo Contrast  Result Date: 09/26/2016 CLINICAL DATA:  Pt states that he has had a headache in the frontal area of his head since yesterday. He states that ibuprofen has not relieved it EXAM: CT HEAD WITHOUT CONTRAST TECHNIQUE: Contiguous axial images were obtained from the base of the skull through the vertex without intravenous contrast. COMPARISON:  09/18/2015 FINDINGS: Brain: No evidence of acute infarction, hemorrhage, hydrocephalus, extra-axial collection or mass lesion/mass effect. Vascular: No hyperdense vessel or unexpected calcification. Skull: Normal. Negative for fracture or focal lesion. Sinuses/Orbits: There is significant sinus disease. Dependent fluid lies in both maxillary sinuses. There is dependent fluid in the sphenoid sinuses. Moderate mucosal thickening lines the ethmoid, sphenoid and maxillary sinuses. Mild mucosal thickening noted along the inferior frontal sinuses. Several left mastoid air cells show fluid attenuation. Globes and orbits are unremarkable. Other: None IMPRESSION: 1. No intracranial abnormality. 2. Significant sinus disease including air-fluid levels. Acute sinusitis is suspected as the source of this patient's headache. Electronically Signed   By: Lajean Manes M.D.   On: 09/26/2016 16:36   Dg Chest Port 1 View  Result Date: 10/18/2016 CLINICAL DATA:  Respiratory distress EXAM: PORTABLE CHEST 1 VIEW COMPARISON:  06/09/2016 FINDINGS: Patchy atelectasis or infiltrate at the left base. No pleural effusion. Normal cardiomediastinal silhouette. No pneumothorax. IMPRESSION: Patchy atelectasis versus small infiltrate at the left lung base Electronically Signed   By: Donavan Foil  M.D.   On: 10/18/2016 23:56    Time Spent in minutes  30   Vint Pola K M.D on 10/19/2016 at 12:09 PM  Between 7am to 7pm - Pager - [phone]  After 7pm go to [email or web address] - password [facility]  [facility] -  Office  [phone]

## 2016-10-19 NOTE — Progress Notes (Signed)
Patient arrived to floor via stretcher, alert and oriented X 4. Verified identification with armband. Skin assessed. Patient placed on nasal cannula. Side rails up X2. Safety maintained.  Will continue to monitor.

## 2016-10-19 NOTE — ED Notes (Signed)
RT called to wean pt off BiPap per [place], [place].

## 2016-10-19 NOTE — H&P (Signed)
History and Physical  Patient Name: [name] [name]     [id number]    DOB: [birth year]    DOA: 10/18/2016 PCP: Linus Mako, NP   Patient coming from: Home  Chief Complaint: Dyspnea  HPI: [name] [name] is a 66 y.o. male with a past medical history significant for COPD without PFTs, HTN who presents with acute dyspnea.  The patient was in his usual state of health until about 3 weeks ago when he developed URI symptoms, dyspnea and cough. He was seen in the emergency room, diagnosed with sinusitis, discharge with amoxicillin. He reports completing the amoxicillin (which he calls "prednisone" although I see the ED notes indicate no steroids were given, was not wheezing) and his symptoms improved until he finished it, and since then, all of his symptoms have returned.    He has cough productive of whitish sputum, wheezing, worse in the morning.  Two days ago he called his Pulm office with complaints of dyspnea not improving with albuterol, was prescribed Zpak and prednisone but he did not get the message and didn't start them.  Then today his symptoms got substantially worse, he called EMS in respiratory distress, and they placed on BiPAP because of tahcypnea, administered Solu-medrol and nebs.  ED course: -Afebrile, heart rate 92, respirations 30, blood pressure 150/99, pulse oximetry 100% on BiPAP -Na 138, K 3.5, Cr 0.8, WBC 5.9K, Hgb 13.6 -Influenza negative -Lactate 2.34 -Chest x-ray showed a questionable left base infiltrate versus atelectasis -ECG was unremarkable -Weaned off BiPAP and comfortable -he was given ceftriaxone, azithromycin, magnesium, bronchodilators, and TRH were asked to evaluate for COPD flare, possibly complicated by pneumonia     The patient was evaluated by Dr. Melvyn Novas in Dec for COPD.  Given not a smoker, alternative diagnoses were entertained and PFTs were arranged but have not happened yet.  Patient off all maintenance inhalers, mostly because these don't seem  to improve his symptoms nor prevent exacerbations like the current one.    ROS: Review of Systems  Constitutional: Negative for chills, fever and malaise/fatigue.  HENT: Negative for congestion and sore throat.   Respiratory: Positive for cough, sputum production, shortness of breath and wheezing.   All other systems reviewed and are negative.         Past Medical History:  Diagnosis Date  . Abnormal EKG 12/24/11   anteroseptal and lateral ST elevation, felt r/t early repolarization;  Cardiac cath 12/24/11 - normal coronary anatomy, EF 55-65%  . Bradycardia, sinus 12/24/11  . COPD (chronic obstructive pulmonary disease) ([place])   . History of alcohol abuse    hospitalized for detox 2002  . HTN (hypertension)   . Hypotension 12/24/11   in the setting of dehydration   . Marijuana use   . Syncope and collapse 12/24/11   2/2 hypotension in the setting of dehydration    Past Surgical History:  Procedure Laterality Date  . CARDIAC CATHETERIZATION  12/24/11   normal coronary anatomy, EF 55-65%  . CIRCUMCISION  1972  . ESOPHAGOGASTRODUODENOSCOPY N/A 10/09/2015   Procedure: ESOPHAGOGASTRODUODENOSCOPY (EGD);  Surgeon: Judeth Horn, MD;  Location: [facility];  Service: General;  Laterality: N/A;  . LACERATION REPAIR  02/2004   arthroscopic debridement of triagular fibrocartilage tear/E-chart; right wrist  . LEFT HEART CATHETERIZATION WITH CORONARY ANGIOGRAM N/A 12/24/2011   Procedure: LEFT HEART CATHETERIZATION WITH CORONARY ANGIOGRAM;  Surgeon: Peter M Martinique, MD;  Location: [facility] CATH LAB;  Service: Cardiovascular;  Laterality: N/A;  . PEG PLACEMENT N/A 10/09/2015  Procedure: PERCUTANEOUS ENDOSCOPIC GASTROSTOMY (PEG) PLACEMENT;  Surgeon: Judeth Horn, MD;  Location: [facility];  Service: General;  Laterality: N/A;  . PERCUTANEOUS TRACHEOSTOMY N/A 10/09/2015   Procedure: PERCUTANEOUS TRACHEOSTOMY;  Surgeon: Judeth Horn, MD;  Location: [facility];  Service: General;  Laterality: N/A;    Social  History: Patient lives alone.  The patient walks unassisted.  He was a heavy Company secretary, now retired.  He is from [place].  Used to smoke cigars/marijuana, no longer.    No Known Allergies  Family history: family history includes Asthma in his other; COPD in his sister; Cancer in his sister.  Prior to Admission medications   Medication Sig Start Date End Date Taking? Authorizing Provider  albuterol (PROVENTIL) (2.5 MG/3ML) 0.083% nebulizer solution Take 3-6 mLs (2.5-5 mg total) by nebulization every 4 (four) hours as needed for wheezing or shortness of breath. 03/12/15  Yes Shanon Rosser, MD  cetirizine (ZYRTEC) 10 MG tablet Take 10 mg by mouth daily. 09/11/16  Yes Historical Provider, MD  finasteride (PROSCAR) 5 MG tablet Take 5 mg by mouth 2 (two) times daily.    Yes Historical Provider, MD  flunisolide (NASALIDE) 25 MCG/ACT (0.025%) SOLN Place 2 sprays into the nose 2 (two) times daily as needed (nasal congestion).   Yes Historical Provider, MD  ibuprofen (ADVIL,MOTRIN) 200 MG tablet Take 800 mg by mouth every 6 (six) hours as needed for headache.    Yes Historical Provider, MD  montelukast (SINGULAIR) 10 MG tablet Take 10 mg by mouth at bedtime.   Yes Historical Provider, MD  Multiple Vitamin (MULTIVITAMIN WITH MINERALS) TABS tablet Take 1 tablet by mouth daily.   Yes Historical Provider, MD  omeprazole (PRILOSEC) 20 MG capsule Take 20 mg by mouth 2 (two) times daily as needed (for acid reflux).    Yes Historical Provider, MD  terazosin (HYTRIN) 5 MG capsule Take 5 mg by mouth daily. 09/15/16  Yes Historical Provider, MD  valsartan-hydrochlorothiazide (DIOVAN HCT) 80-12.5 MG tablet Take 1 tablet by mouth daily. 08/31/16  Yes Tanda Rockers, MD       Physical Exam: BP 147/87   Pulse 91   Temp 97.8 F (36.6 C) (Oral)   Resp 19   Ht 5\' 7"  (1.702 m)   Wt 66.7 kg (147 lb)   SpO2 98%   BMI 23.02 kg/m  General appearance: Well-developed, adult male, alert and in midl distress  from dyspnea, on 2L [place].   Eyes: Anicteric, conjunctiva pink, lids and lashes normal. PERRL.    ENT: No nasal deformity, discharge, epistaxis.  Hearing normal. OP moist without lesions.  No sinus tenderness. Neck: No neck masses.  Trachea midline.  No thyromegaly/tenderness. Lymph: No cervical or supraclavicular lymphadenopathy. Skin: Warm and dry.  No jaundice.  No suspicious rashes or lesions. Cardiac: RRR, nl S1-S2, no murmurs appreciated.  Capillary refill is brisk.  JVP normal.  No LE edema.  Radial and DP pulses 2+ and symmetric. Respiratory: Tachypnea but speaking in full sentences.  Loud rhonchi with expiration.  Abdomen: Abdomen soft.  No TTP. No ascites, distension, hepatosplenomegaly.   MSK: No deformities or effusions.  No cyanosis or clubbing. Neuro: Cranial nerves normal.  Sensation intact to light touch. Speech is fluent.  Muscle strength normal.    Psych: Sensorium intact and responding to questions, attention normal.  Behavior appropriate.  Affect normal.  Judgment and insight appear normal.     Labs on Admission:  I have personally reviewed following labs and imaging studies: CBC:  Recent Labs Lab 10/18/16 2357 10/19/16 0015  WBC 5.9  --   NEUTROABS 1.8  --   HGB 13.6 14.3  HCT 40.3 42.0  MCV 90.6  --   PLT 266  --    Basic Metabolic Panel:  Recent Labs Lab 10/19/16 0015  NA 138  K 3.5  CL 102  GLUCOSE 93  BUN 15  CREATININE 0.80   GFR: Estimated Creatinine Clearance: 86.1 mL/min (by C-G formula based on SCr of 0.8 mg/dL).  Liver Function Tests: No results for input(s): AST, ALT, ALKPHOS, BILITOT, PROT, ALBUMIN in the last 168 hours. No results for input(s): LIPASE, AMYLASE in the last 168 hours. No results for input(s): AMMONIA in the last 168 hours. Coagulation Profile: No results for input(s): INR, PROTIME in the last 168 hours. Cardiac Enzymes: No results for input(s): CKTOTAL, CKMB, CKMBINDEX, TROPONINI in the last 168 hours. BNP (last 3  results) No results for input(s): PROBNP in the last 8760 hours. HbA1C: No results for input(s): HGBA1C in the last 72 hours. CBG: No results for input(s): GLUCAP in the last 168 hours. Lipid Profile: No results for input(s): CHOL, HDL, LDLCALC, TRIG, CHOLHDL, LDLDIRECT in the last 72 hours. Thyroid Function Tests: No results for input(s): TSH, T4TOTAL, FREET4, T3FREE, THYROIDAB in the last 72 hours. Anemia Panel: No results for input(s): VITAMINB12, FOLATE, FERRITIN, TIBC, IRON, RETICCTPCT in the last 72 hours. Sepsis Labs: Lactic acid 2.3 Invalid input(s): PROCALCITONIN, LACTICIDVEN No results found for this or any previous visit (from the past 240 hour(s)).       Radiological Exams on Admission: Personally reviewed CXR shows no obvious opacity, official read suggests LL infiltrate vs atelectasis: Dg Chest Port 1 View  Result Date: 10/18/2016 CLINICAL DATA:  Respiratory distress EXAM: PORTABLE CHEST 1 VIEW COMPARISON:  06/09/2016 FINDINGS: Patchy atelectasis or infiltrate at the left base. No pleural effusion. Normal cardiomediastinal silhouette. No pneumothorax. IMPRESSION: Patchy atelectasis versus small infiltrate at the left lung base Electronically Signed   By: Donavan Foil M.D.   On: 10/18/2016 23:56    EKG: Independently reviewed. Rate 87, QTc 430.  No ST changes.   Echo Jan 2017: EF 55-60% PAP 43     Assessment/Plan Principal Problem:   COPD exacerbation (HCC) Active Problems:   Essential hypertension   CAP (community acquired pneumonia)  1. COPD exacerbation:  The patient was a long-time marijuana smoker, seems to have flares of wheezing/dyspnea/sputum resolved with prednisone.  PFTs pending. -Prednisone 40 mg daily -Albuterol scheduled and PRN    2. Possible pneumonia, sepsis not yet ruled out:  Elevated lactic acid either from pneumonia (clnically doesn't appaer this way) or from bronchospasm/albuterol. Currently without fever, WBC. -Ceftriaxone,  azithromycin -Resp virus panel -Droplet precautions -Check procalcitonin -Follow lactic acid -Blood culture pending  3. Hypertension:  -Continue Valsartan-HCT, terazosin  4. Other medications:  -Continue PPI -Continue finasteride -Continue loratadine         DVT prophylaxis: Lovenox  Code Status: FULL  Family Communication: None present  Disposition Plan: Anticipate steroids and bronchodilators and monitor for clinical improvement Consults called: None Admission status: INPATIENT        Medical decision making: Patient seen at 2:57 AM on 10/19/2016.  The patient was discussed with Antonietta Breach, PA-C.  What exists of the patient's chart was reviewed in depth and summarized above.  Clinical condition: stable.        Edwin Dada [facility] Pager [phone]      At the time of  admission, it appears that the appropriate admission status for this patient is INPATIENT. This is judged to be reasonable and necessary in order to provide the required intensity of service to ensure the patient's safety given the presenting symptoms, physical exam findings, and initial radiographic and laboratory data in the context of their chronic comorbidities.  Together, these circumstances are felt to place him at high risk for further clinical deterioration threatening life, limb, or organ.   Patient requires inpatient status due to high intensity of service, high risk for further deterioration and high frequency of surveillance required because of this severe exacerbation of their chronic organ failure.  I certify that at the point of admission it is my clinical judgment that the patient will require inpatient hospital care spanning beyond 2 midnights from the point of admission and that early discharge would result in unnecessary risk of decompensation and readmission or threat to life, limb or bodily function.

## 2016-10-20 ENCOUNTER — Telehealth: Payer: Self-pay | Admitting: Internal Medicine

## 2016-10-20 DIAGNOSIS — J441 Chronic obstructive pulmonary disease with (acute) exacerbation: Secondary | ICD-10-CM | POA: Diagnosis not present

## 2016-10-20 MED ORDER — INFLUENZA VAC SPLIT QUAD 0.5 ML IM SUSY
0.5000 mL | PREFILLED_SYRINGE | INTRAMUSCULAR | Status: AC
  Administered 2016-10-20: 0.5 mL via INTRAMUSCULAR
  Filled 2016-10-20: qty 0.5

## 2016-10-20 MED ORDER — ALBUTEROL SULFATE (2.5 MG/3ML) 0.083% IN NEBU
2.5000 mg | INHALATION_SOLUTION | Freq: Three times a day (TID) | RESPIRATORY_TRACT | Status: DC
  Administered 2016-10-20: 2.5 mg via RESPIRATORY_TRACT
  Filled 2016-10-20: qty 3

## 2016-10-20 MED ORDER — AZITHROMYCIN 500 MG PO TABS: 500.0000 mg | ORAL_TABLET | ORAL | 0 refills | Status: DC

## 2016-10-20 MED ORDER — INFLUENZA VAC SPLIT QUAD 0.5 ML IM SUSY: 0.5000 mL | PREFILLED_SYRINGE | INTRAMUSCULAR | Status: DC

## 2016-10-20 MED ORDER — PREDNISONE 5 MG PO TABS: ORAL_TABLET | ORAL | 0 refills | Status: DC

## 2016-10-20 MED ORDER — ACETAMINOPHEN 325 MG PO TABS
650.0000 mg | ORAL_TABLET | Freq: Four times a day (QID) | ORAL | Status: DC | PRN
  Administered 2016-10-20: 650 mg via ORAL
  Filled 2016-10-20: qty 2

## 2016-10-20 NOTE — Discharge Instructions (Signed)
Follow with Primary MD [name], [name], NP in 7 days   Get CBC, CMP, 2 view Chest X ray checked  by Primary MD or SNF MD in 5-7 days ( we routinely change or add medications that can affect your baseline labs and fluid status, therefore we recommend that you get the mentioned basic workup next visit with your PCP, your PCP may decide not to get them or add new tests based on their clinical decision)  Activity: As tolerated with Full fall precautions use walker/cane & assistance as needed  Disposition Home    Diet: Heart Healthy    For Heart failure patients - Check your Weight same time everyday, if you gain over 2 pounds, or you develop in leg swelling, experience more shortness of breath or chest pain, call your Primary MD immediately. Follow Cardiac Low Salt Diet and 1.5 lit/day fluid restriction.   On your next visit with your primary care physician please Get Medicines reviewed and adjusted.   Please request your Prim.MD to go over all Hospital Tests and Procedure/Radiological results at the follow up, please get all Hospital records sent to your Prim MD by signing hospital release before you go home.   If you experience worsening of your admission symptoms, develop shortness of breath, life threatening emergency, suicidal or homicidal thoughts you must seek medical attention immediately by calling 911 or calling your MD immediately  if symptoms less severe.  You Must read complete instructions/literature along with all the possible adverse reactions/side effects for all the Medicines you take and that have been prescribed to you. Take any new Medicines after you have completely understood and accpet all the possible adverse reactions/side effects.   Do not drive, operate heavy machinery, perform activities at heights, swimming or participation in water activities or provide baby sitting services if your were admitted for syncope or siezures until you have seen by Primary MD or a  Neurologist and advised to do so again.  Do not drive when taking Pain medications.    Do not take more than prescribed Pain, Sleep and Anxiety Medications  Special Instructions: If you have smoked or chewed Tobacco  in the last 2 yrs please stop smoking, stop any regular Alcohol  and or any Recreational drug use.  Wear Seat belts while driving.   Please note  You were cared for by a hospitalist during your hospital stay. If you have any questions about your discharge medications or the care you received while you were in the hospital after you are discharged, you can call the unit and asked to speak with the hospitalist on call if the hospitalist that took care of you is not available. Once you are discharged, your primary care physician will handle any further medical issues. Please note that NO REFILLS for any discharge medications will be authorized once you are discharged, as it is imperative that you return to your primary care physician (or establish a relationship with a primary care physician if you do not have one) for your aftercare needs so that they can reassess your need for medications and monitor your lab values.

## 2016-10-20 NOTE — Progress Notes (Signed)
[name] to be D/C'd Home per MD order.  Discussed with the patient and all questions fully answered.  VSS, Skin clean, dry and intact without evidence of skin break down, no evidence of skin tears noted. IV catheter discontinued intact. Site without signs and symptoms of complications. Dressing and pressure applied.  An After Visit Summary was printed and given to the patient. Patient received prescription.  D/c education completed with patient/family including follow up instructions, medication list, d/c activities limitations if indicated, with other d/c instructions as indicated by MD - patient able to verbalize understanding, all questions fully answered.   Patient instructed to return to ED, call 911, or call MD for any changes in condition.   Patient to be escorted via [place], and D/C home via private auto.  Sophina Mitten C 10/20/2016 10:48 AM

## 2016-10-20 NOTE — Progress Notes (Signed)
Patient complaining of mild headache and asking for tylenol. MD paged for orders.

## 2016-10-20 NOTE — Progress Notes (Signed)
Patient ambulated Sp02 94% on room air at rest and while ambulating. Patient tolerated well. MD paged to make aware.

## 2016-10-20 NOTE — Progress Notes (Addendum)
Patient has loss of IV access. MD paged to clarify IV solumedrol. MD paged twice.   Infection Preventions contacted RN and verbal order to d/c droplet precautions.

## 2016-10-20 NOTE — Discharge Summary (Signed)
[name] [name] [id number] DOB: 03/23/1951 DOA: 10/18/2016  PCP: Linus Mako, NP  Admit date: 10/18/2016  Discharge date: 10/20/2016  Admitted From: Home  Disposition:  Home   Recommendations for Outpatient Follow-up:   Follow up with PCP in 1-2 weeks  PCP Please obtain BMP/CBC, 2 view CXR in 1week,  (see Discharge instructions)   PCP Please follow up on the following pending results: None   Home Health: None   Equipment/Devices: None  Consultations: None Discharge Condition: Stable   CODE STATUS: Full   Diet Recommendation: Heart Healthy    Chief Complaint  Patient presents with  . Respiratory Distress     Brief history of present illness from the day of admission and additional interim summary    [name] [name] a 66 y.o.malewith a past medical history significant for COPD without PFTs, HTNwho presented with acute Hypoxic respiratory failure due to COPD exacerbation.                                                                 Hospital Course    1. Acute hypoxic respiratory failure due to COPD exacerbation caused by Viral URI. No pneumonia. Treated with oxygen, IV steroids and oral azithromycin, much improved, now no oxygen demand, no wheezing, close to baseline. Will be placed on prednisone taper 3 more days of azithromycin, continue home nebulizer treatments with PCP. Request PCP to consider adding Spiriva in the outpatient setting and consider outpatient pulmonary follow-up.   2. Essential hypertension. Stable no acute issues continue combination of ARB and HCTZ.  3. GERD. On PPI continue.  4. BPH. Continue Proscar.   Discharge diagnosis     Principal Problem:   COPD exacerbation ([place]) Active Problems:   Essential hypertension   CAP (community acquired  pneumonia)    Discharge instructions    Discharge Instructions    Diet - low sodium heart healthy    Complete by:  As directed    Discharge instructions    Complete by:  As directed    Follow with Primary MD [name], [name], NP in 7 days   Get CBC, CMP, 2 view Chest X ray checked  by Primary MD or SNF MD in 5-7 days ( we routinely change or add medications that can affect your baseline labs and fluid status, therefore we recommend that you get the mentioned basic workup next visit with your PCP, your PCP may decide not to get them or add new tests based on their clinical decision)  Activity: As tolerated with Full fall precautions use walker/cane & assistance as needed  Disposition Home    Diet: Heart Healthy    For Heart failure patients - Check your Weight same time everyday, if you gain over 2 pounds, or you develop in leg swelling, experience more shortness  of breath or chest pain, call your Primary MD immediately. Follow Cardiac Low Salt Diet and 1.5 lit/day fluid restriction.   On your next visit with your primary care physician please Get Medicines reviewed and adjusted.   Please request your Prim.MD to go over all Hospital Tests and Procedure/Radiological results at the follow up, please get all Hospital records sent to your Prim MD by signing hospital release before you go home.   If you experience worsening of your admission symptoms, develop shortness of breath, life threatening emergency, suicidal or homicidal thoughts you must seek medical attention immediately by calling 911 or calling your MD immediately  if symptoms less severe.  You Must read complete instructions/literature along with all the possible adverse reactions/side effects for all the Medicines you take and that have been prescribed to you. Take any new Medicines after you have completely understood and accpet all the possible adverse reactions/side effects.   Do not drive, operate heavy machinery,  perform activities at heights, swimming or participation in water activities or provide baby sitting services if your were admitted for syncope or siezures until you have seen by Primary MD or a Neurologist and advised to do so again.  Do not drive when taking Pain medications.    Do not take more than prescribed Pain, Sleep and Anxiety Medications  Special Instructions: If you have smoked or chewed Tobacco  in the last 2 yrs please stop smoking, stop any regular Alcohol  and or any Recreational drug use.  Wear Seat belts while driving.   Please note  You were cared for by a hospitalist during your hospital stay. If you have any questions about your discharge medications or the care you received while you were in the hospital after you are discharged, you can call the unit and asked to speak with the hospitalist on call if the hospitalist that took care of you is not available. Once you are discharged, your primary care physician will handle any further medical issues. Please note that NO REFILLS for any discharge medications will be authorized once you are discharged, as it is imperative that you return to your primary care physician (or establish a relationship with a primary care physician if you do not have one) for your aftercare needs so that they can reassess your need for medications and monitor your lab values.   Increase activity slowly    Complete by:  As directed       Discharge Medications   Allergies as of 10/20/2016   No Known Allergies     Medication List    STOP taking these medications   ibuprofen 200 MG tablet Commonly known as:  ADVIL,MOTRIN     TAKE these medications   albuterol (2.5 MG/3ML) 0.083% nebulizer solution Commonly known as:  PROVENTIL Take 3-6 mLs (2.5-5 mg total) by nebulization every 4 (four) hours as needed for wheezing or shortness of breath.   azithromycin 500 MG tablet Commonly known as:  ZITHROMAX Take 1 tablet (500 mg total) by mouth  daily.   cetirizine 10 MG tablet Commonly known as:  ZYRTEC Take 10 mg by mouth daily.   finasteride 5 MG tablet Commonly known as:  PROSCAR Take 5 mg by mouth 2 (two) times daily.   flunisolide 25 MCG/ACT (0.025%) Soln Commonly known as:  NASALIDE Place 2 sprays into the nose 2 (two) times daily as needed (nasal congestion).   montelukast 10 MG tablet Commonly known as:  SINGULAIR Take 10 mg by mouth  at bedtime.   multivitamin with minerals Tabs tablet Take 1 tablet by mouth daily.   omeprazole 20 MG capsule Commonly known as:  PRILOSEC Take 20 mg by mouth 2 (two) times daily as needed (for acid reflux).   predniSONE 5 MG tablet Commonly known as:  DELTASONE Label  & dispense according to the schedule below. 10 Pills PO for 3 days then, 8 Pills PO for 3 days, 6 Pills PO for 3 days, 4 Pills PO for 3 days, 2 Pills PO for 3 days, 1 Pills PO for 3 days, 1/2 Pill  PO for 3 days then STOP. Total 95 pills.   terazosin 5 MG capsule Commonly known as:  HYTRIN Take 5 mg by mouth daily.   valsartan-hydrochlorothiazide 80-12.5 MG tablet Commonly known as:  DIOVAN HCT Take 1 tablet by mouth daily.       Follow-up Information    [name], [name], NP. Schedule an appointment as soon as possible for a visit in 1 week(s).   Specialty:  Nurse Practitioner Contact information: [address] [place] [address] [phone]           Major procedures and Radiology Reports - PLEASE review detailed and final reports thoroughly  -       Ct Head Wo Contrast  Result Date: 09/26/2016 CLINICAL DATA:  Pt states that he has had a headache in the frontal area of his head since yesterday. He states that ibuprofen has not relieved it EXAM: CT HEAD WITHOUT CONTRAST TECHNIQUE: Contiguous axial images were obtained from the base of the skull through the vertex without intravenous contrast. COMPARISON:  09/18/2015 FINDINGS: Brain: No evidence of acute infarction, hemorrhage, hydrocephalus,  extra-axial collection or mass lesion/mass effect. Vascular: No hyperdense vessel or unexpected calcification. Skull: Normal. Negative for fracture or focal lesion. Sinuses/Orbits: There is significant sinus disease. Dependent fluid lies in both maxillary sinuses. There is dependent fluid in the sphenoid sinuses. Moderate mucosal thickening lines the ethmoid, sphenoid and maxillary sinuses. Mild mucosal thickening noted along the inferior frontal sinuses. Several left mastoid air cells show fluid attenuation. Globes and orbits are unremarkable. Other: None IMPRESSION: 1. No intracranial abnormality. 2. Significant sinus disease including air-fluid levels. Acute sinusitis is suspected as the source of this patient's headache. Electronically Signed   By: Lajean Manes M.D.   On: 09/26/2016 16:36   Dg Chest Port 1 View  Result Date: 10/18/2016 CLINICAL DATA:  Respiratory distress EXAM: PORTABLE CHEST 1 VIEW COMPARISON:  06/09/2016 FINDINGS: Patchy atelectasis or infiltrate at the left base. No pleural effusion. Normal cardiomediastinal silhouette. No pneumothorax. IMPRESSION: Patchy atelectasis versus small infiltrate at the left lung base Electronically Signed   By: Donavan Foil M.D.   On: 10/18/2016 23:56    Micro Results     Recent Results (from the past 240 hour(s))  Respiratory Panel by PCR     Status: Abnormal   Collection Time: 10/18/16 11:48 PM  Result Value Ref Range Status   Adenovirus NOT DETECTED NOT DETECTED Final   Coronavirus 229E NOT DETECTED NOT DETECTED Final   Coronavirus HKU1 NOT DETECTED NOT DETECTED Final   Coronavirus NL63 NOT DETECTED NOT DETECTED Final   Coronavirus OC43 DETECTED (A) NOT DETECTED Final   Metapneumovirus NOT DETECTED NOT DETECTED Final   Rhinovirus / Enterovirus NOT DETECTED NOT DETECTED Final   Influenza A NOT DETECTED NOT DETECTED Final   Influenza B NOT DETECTED NOT DETECTED Final   Parainfluenza Virus 1 NOT DETECTED NOT DETECTED Final  Parainfluenza  Virus 2 NOT DETECTED NOT DETECTED Final   Parainfluenza Virus 3 NOT DETECTED NOT DETECTED Final   Parainfluenza Virus 4 NOT DETECTED NOT DETECTED Final   Respiratory Syncytial Virus NOT DETECTED NOT DETECTED Final   Bordetella pertussis NOT DETECTED NOT DETECTED Final   Chlamydophila pneumoniae NOT DETECTED NOT DETECTED Final   Mycoplasma pneumoniae NOT DETECTED NOT DETECTED Final    Today   Subjective    [name] [name] today has no headache,no chest abdominal pain,no new weakness tingling or numbness, feels much better wants to go home today.     Objective   Blood pressure 125/72, pulse 81, temperature 98 F (36.7 C), temperature source Oral, resp. rate 18, height 5\' 7"  (1.702 m), weight 66.7 kg (147 lb), SpO2 95 %.   Intake/Output Summary (Last 24 hours) at 10/20/16 0957 Last data filed at 10/20/16 0800  Gross per 24 hour  Intake              975 ml  Output                2 ml  Net              973 ml    Exam Awake Alert, Oriented x 3, No new F.N deficits, Normal affect [place].AT,PERRAL Supple Neck,No JVD, No cervical lymphadenopathy appriciated.  Symmetrical Chest wall movement, Good air movement bilaterally, CTAB RRR,No Gallops,Rubs or new Murmurs, No Parasternal Heave +ve B.Sounds, Abd Soft, Non tender, No organomegaly appriciated, No rebound -guarding or rigidity. No Cyanosis, Clubbing or edema, No new Rash or bruise   Data Review   CBC w Diff: Lab Results  Component Value Date   WBC 5.1 10/19/2016   HGB 13.7 10/19/2016   HCT 39.8 10/19/2016   PLT 289 10/19/2016   LYMPHOPCT 47 10/18/2016   MONOPCT 7 10/18/2016   EOSPCT 15 10/18/2016   BASOPCT 1 10/18/2016    CMP: Lab Results  Component Value Date   NA 135 10/19/2016   K 4.1 10/19/2016   CL 99 (L) 10/19/2016   CO2 26 10/19/2016   BUN 14 10/19/2016   CREATININE 0.77 10/19/2016   PROT 7.5 09/26/2016   ALBUMIN 3.8 09/26/2016   BILITOT 1.2 09/26/2016   ALKPHOS 53 09/26/2016   AST 26 09/26/2016   ALT 33  09/26/2016  .   Total Time in preparing paper work, data evaluation and todays exam - 35 minutes  Thurnell Lose M.D on 10/20/2016 at 9:57 AM  [facility]   Office  [phone]

## 2016-10-20 NOTE — Telephone Encounter (Signed)
lmtcb x1 for pt. 

## 2016-10-20 NOTE — Care Management CC44 (Signed)
Condition Code 44 Documentation Completed  Patient Details  Name: [name] [name] MRN: [id number] Date of Birth: Oct 27, 1950   Condition Code 44 given:  Yes Patient signature on Condition Code 44 notice:  Yes Documentation of 2 MD's agreement:  Yes Code 44 added to claim:  Yes    Carles Collet, RN 10/20/2016, 10:49 AM

## 2016-10-20 NOTE — Care Management Obs Status (Signed)
[facility] NOTIFICATION   Patient Details  Name: Diaz Matusik MRN: [id number] Date of Birth: 07-28-1951   Medicare Observation Status Notification Given:  Yes    Carles Collet, RN 10/20/2016, 10:49 AM

## 2016-10-21 ENCOUNTER — Emergency Department (HOSPITAL_COMMUNITY)
Admission: EM | Admit: 2016-10-21 | Discharge: 2016-10-21 | Disposition: A | Discharge status: Home / Self Care | Payer: Medicare Other | Attending: Emergency Medicine | Admitting: Emergency Medicine

## 2016-10-21 ENCOUNTER — Encounter (HOSPITAL_COMMUNITY): Payer: Self-pay | Admitting: Emergency Medicine

## 2016-10-21 ENCOUNTER — Ambulatory Visit (INDEPENDENT_AMBULATORY_CARE_PROVIDER_SITE_OTHER): Payer: Medicare Other | Admitting: Adult Health

## 2016-10-21 ENCOUNTER — Emergency Department (HOSPITAL_COMMUNITY): Payer: Medicare Other

## 2016-10-21 ENCOUNTER — Encounter: Payer: Self-pay | Admitting: Adult Health

## 2016-10-21 VITALS — BP 112/66 | HR 98 | Temp 98.1°F

## 2016-10-21 DIAGNOSIS — J4541 Moderate persistent asthma with (acute) exacerbation: Secondary | ICD-10-CM

## 2016-10-21 DIAGNOSIS — J45901 Unspecified asthma with (acute) exacerbation: Secondary | ICD-10-CM | POA: Diagnosis not present

## 2016-10-21 DIAGNOSIS — Z79899 Other long term (current) drug therapy: Secondary | ICD-10-CM | POA: Insufficient documentation

## 2016-10-21 DIAGNOSIS — J449 Chronic obstructive pulmonary disease, unspecified: Secondary | ICD-10-CM | POA: Diagnosis not present

## 2016-10-21 DIAGNOSIS — R0602 Shortness of breath: Secondary | ICD-10-CM | POA: Diagnosis present

## 2016-10-21 DIAGNOSIS — I10 Essential (primary) hypertension: Secondary | ICD-10-CM | POA: Diagnosis not present

## 2016-10-21 LAB — BASIC METABOLIC PANEL
Anion gap: 8 (ref 5–15)
BUN: 18 mg/dL (ref 6–20)
CALCIUM: 8.8 mg/dL — AB (ref 8.9–10.3)
CO2: 27 mmol/L (ref 22–32)
CREATININE: 0.78 mg/dL (ref 0.61–1.24)
Chloride: 102 mmol/L (ref 101–111)
GFR calc non Af Amer: >60 mL/min (ref >60)
GLUCOSE: 109 mg/dL — AB (ref 65–99)
Potassium: 3.9 mmol/L (ref 3.5–5.1)
Sodium: 137 mmol/L (ref 135–145)

## 2016-10-21 LAB — CBC WITH DIFFERENTIAL/PLATELET
BASOS PCT: 0 %
Basophils Absolute: 0 10*3/uL (ref 0.0–0.1)
Eosinophils Absolute: 0 10*3/uL (ref 0.0–0.7)
Eosinophils Relative: 0 %
HEMATOCRIT: 39 % (ref 39.0–52.0)
Hemoglobin: 13.6 g/dL (ref 13.0–17.0)
LYMPHS ABS: 0.9 10*3/uL (ref 0.7–4.0)
Lymphocytes Relative: 12 %
MCH: 31.6 pg (ref 26.0–34.0)
MCHC: 34.9 g/dL (ref 30.0–36.0)
MCV: 90.7 fL (ref 78.0–100.0)
MONO ABS: 0.4 10*3/uL (ref 0.1–1.0)
MONOS PCT: 5 %
NEUTROS ABS: 6.3 10*3/uL (ref 1.7–7.7)
Neutrophils Relative %: 83 %
Platelets: 259 10*3/uL (ref 150–400)
RBC: 4.3 MIL/uL (ref 4.22–5.81)
RDW: 13.8 % (ref 11.5–15.5)
WBC: 7.6 10*3/uL (ref 4.0–10.5)

## 2016-10-21 MED ORDER — IPRATROPIUM BROMIDE 0.02 % IN SOLN
0.5000 mg | Freq: Once | RESPIRATORY_TRACT | Status: AC
  Administered 2016-10-21: 0.5 mg via RESPIRATORY_TRACT
  Filled 2016-10-21: qty 2.5

## 2016-10-21 MED ORDER — METHYLPREDNISOLONE SODIUM SUCC 125 MG IJ SOLR
125.0000 mg | Freq: Once | INTRAMUSCULAR | Status: AC
Start: 2016-10-21 — End: 2016-10-21
  Administered 2016-10-21: 125 mg via INTRAVENOUS
  Filled 2016-10-21: qty 2

## 2016-10-21 MED ORDER — ALBUTEROL SULFATE HFA 108 (90 BASE) MCG/ACT IN AERS
2.0000 | INHALATION_SPRAY | Freq: Once | RESPIRATORY_TRACT | Status: DC
  Filled 2016-10-21: qty 6.7

## 2016-10-21 MED ORDER — ALBUTEROL (5 MG/ML) CONTINUOUS INHALATION SOLN
10.0000 mg/h | INHALATION_SOLUTION | RESPIRATORY_TRACT | Status: DC
  Administered 2016-10-21: 10 mg/h via RESPIRATORY_TRACT
  Filled 2016-10-21: qty 20
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
## 2016-10-21 NOTE — Addendum Note (Signed)
Addended by: Christinia Gully B on: 10/21/2016 10:16 AM   Modules accepted: Level of Service
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
## 2016-10-21 NOTE — ED Triage Notes (Addendum)
Pt sent from PCP for SOB; pt had several albuterol treatments earlier today; pt had recent hospitalization for pna; pt states SOB began this am; PCP placed pt on 2L 

## 2016-10-21 NOTE — Telephone Encounter (Signed)
Called and spoke with pt and he stated that he was seen in the ER and released.  He stated that he was given pred and abx and he is now using the nebulizer 5 times per day.  Pt was having a really hard time with coughing and breathing while we were talking.  I advised the pt that he needed to be seen this morning and he has been placed on TP schedule.  Pt was advised if he cannot make it to this appt that he may need to go to the ER for treatment. Pt voiced his understanding

## 2016-10-21 NOTE — ED Notes (Signed)
Respiratory notified of breathing treatment.

## 2016-10-21 NOTE — Patient Instructions (Signed)
To ER via EMS 

## 2016-10-21 NOTE — Assessment & Plan Note (Signed)
Refractory exacerbation in never smoker (marajuana use /PFT pending )  Pt has had a critical illness in early 2017 w/ ARDS and Trach , he has severe upper airway cough/wheezing ? Tracheal issue .  Pt will need admission to stabilize and for further treatment .  Unfortunately no hospital beds availble , transport to ER via EMS   Plan  Transport to ER via EMS

## 2016-10-22 ENCOUNTER — Telehealth: Payer: Self-pay | Admitting: Internal Medicine

## 2016-10-22 NOTE — Telephone Encounter (Signed)
Spoke with pt's daughter. States that he went to the ED and was diagnosed with bronchitis. He was given an albuterol inhaler at the ED. Pt wanted to make sure that it was safe to take this. During our conversation, the pt's daughter had the pt get on the phone. Advised him that it would be fine for him to use but only use it as needed. Nothing further was needed.

## 2016-10-24 LAB — CULTURE, BLOOD (SINGLE): Culture: NO GROWTH

## 2016-10-27 ENCOUNTER — Other Ambulatory Visit: Payer: Self-pay | Admitting: Family Medicine

## 2016-10-30 ENCOUNTER — Ambulatory Visit (INDEPENDENT_AMBULATORY_CARE_PROVIDER_SITE_OTHER): Payer: Medicare Other | Admitting: Internal Medicine

## 2016-10-30 ENCOUNTER — Ambulatory Visit: Payer: Self-pay | Admitting: Internal Medicine

## 2016-10-30 ENCOUNTER — Encounter: Payer: Self-pay | Admitting: Internal Medicine

## 2016-10-30 VITALS — BP 104/58 | HR 74 | Ht 66.0 in | Wt 154.0 lb

## 2016-10-30 DIAGNOSIS — J4530 Mild persistent asthma, uncomplicated: Secondary | ICD-10-CM

## 2016-10-30 DIAGNOSIS — R05 Cough: Secondary | ICD-10-CM

## 2016-10-30 DIAGNOSIS — R058 Other specified cough: Secondary | ICD-10-CM

## 2016-10-30 LAB — PULMONARY FUNCTION TEST
FEF 25-75 PRE: 1.42 L/s
FEF2575-%PRED-PRE: 59 %
FEV1-%Pred-Pre: 83 %
FEV1-Pre: 2.2 L
FEV1FVC-%Pred-Pre: 89 %
FEV6-%PRED-PRE: 93 %
FEV6-Pre: 3.09 L
FEV6FVC-%PRED-PRE: 102 %
FVC-%PRED-PRE: 91 %
FVC-PRE: 3.18 L
PRE FEV6/FVC RATIO: 97 %
Pre FEV1/FVC ratio: 69 %

## 2016-10-30 MED ORDER — PREDNISONE 10 MG PO TABS: ORAL_TABLET | ORAL | 0 refills | Status: DC

## 2016-10-30 MED ORDER — FAMOTIDINE 20 MG PO TABS: ORAL_TABLET | ORAL | Status: DC

## 2016-10-30 MED ORDER — PANTOPRAZOLE SODIUM 40 MG PO TBEC: 40.0000 mg | DELAYED_RELEASE_TABLET | Freq: Every day | ORAL | 2 refills | Status: DC

## 2016-10-30 MED ORDER — BUDESONIDE-FORMOTEROL FUMARATE 80-4.5 MCG/ACT IN AERO: 2.0000 | INHALATION_SPRAY | Freq: Two times a day (BID) | RESPIRATORY_TRACT | 0 refills | Status: DC
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
## 2016-10-30 NOTE — Progress Notes (Signed)
Attempted PFT today. Seen by MW for acute visit as PFT could not be completed. [name] [name]

## 2016-10-30 NOTE — Patient Instructions (Addendum)
Plan A = Automatic = change symbicort to 80 strength Take 2 puffs first thing in am and then another 2 puffs about 12 hours later.    Work on inhaler technique:  relax and gently blow all the way out then take a nice smooth deep breath back in, triggering the inhaler at same time you start breathing in.  Hold for up to 5 seconds if you can. Blow out thru nose. Rinse and gargle with water when done     Plan B = Backup Only use your albuterol (proventil)  as a rescue medication to be used if you can't catch your breath by resting or doing a relaxed purse lip breathing pattern.  - The less you use it, the better it will work when you need it. - Ok to use the inhaler up to 2 puffs  every 4 hours if you must but call for appointment if use goes up over your usual need - Don't leave home without it !!  (think of it like the spare tire for your car)   Plan C = Crisis - only use your albuterol nebulizer if you first try Plan B and it fails to help > ok to use the nebulizer up to every 4 hours but if start needing it regularly call for immediate appointment   Prednisone 10 mg take  4 each am x 2 days,   2 each am x 2 days,  1 each am x 2 days and stop   Pantoprazole (protonix) 40 mg   (or two prilosec) Take  30-60 min before first meal of the day and Pepcid (famotidine)  20 mg one @  bedtime until return to office - this is the best way to tell whether stomach acid is contributing to your problem.    GERD (REFLUX)  is an extremely common cause of respiratory symptoms just like yours , many times with no obvious heartburn at all.    It can be treated with medication, but also with lifestyle changes including elevation of the head of your bed (ideally with 6 inch  bed blocks),  Smoking cessation, avoidance of late meals, excessive alcohol, and avoid fatty foods, chocolate, peppermint, colas, red wine, and acidic juices such as orange juice.  NO MINT OR MENTHOL PRODUCTS SO NO COUGH DROPS   USE SUGARLESS  CANDY INSTEAD (Jolley ranchers or Stover's or Life Savers) or even ice chips will also do - the key is to swallow to prevent all throat clearing. NO OIL BASED VITAMINS - use powdered substitutes.    Keep your previous appt - call sooner if needed

## 2016-10-31 DIAGNOSIS — J4530 Mild persistent asthma, uncomplicated: Secondary | ICD-10-CM | POA: Insufficient documentation

## 2016-10-31 DIAGNOSIS — J4531 Mild persistent asthma with (acute) exacerbation: Secondary | ICD-10-CM | POA: Insufficient documentation

## 2016-10-31 NOTE — Assessment & Plan Note (Signed)
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

## 2016-10-31 NOTE — Assessment & Plan Note (Signed)
Symptoms are markedly disproportionate to objective findings and not clear this is a lung problem but pt does appear to have difficult airway management issues. DDX of  difficult airways management almost all start with A and  include Adherence, Ace Inhibitors, Acid Reflux, Active Sinus Disease, Alpha 1 Antitripsin deficiency, Anxiety masquerading as Airways dz,  ABPA,  Allergy(esp in young), Aspiration (esp in elderly), Adverse effects of meds,  Active smokers, A bunch of PE's (a small clot burden can't cause this syndrome unless there is already severe underlying pulm or vascular dz with poor reserve) plus two Bs  = Bronchiectasis and Beta blocker use..and one C= CHF   Adherence is always the initial "prime suspect" and is a multilayered concern that requires a "trust but verify" approach in every patient - starting with knowing how to use medications, especially inhalers, correctly, keeping up with refills and understanding the fundamental difference between maintenance and prns vs those medications only taken for a very short course and then stopped and not refilled. - see hfa training, improved from a baseline of < 25% effective - must return with all meds in hand using a trust but verify approach to confirm accurate Medication  Reconciliation The principal here is that until we are certain that the  patients are doing what we've asked, it makes no sense to ask them to do more.    ? Acid (or non-acid) GERD > always difficult to exclude as up to 75% of pts in some series report no assoc GI/ Heartburn symptoms> rec max (24h)  acid suppression and diet restrictions/ reviewed and instructions given in writing.   ? Adverse effects of meds, esp dpi and high dose ics irritating upper airway > try lower ics strength to 80 2bid  ? Allergy > strongly doubt but ok to continue singulair while try to wean steroids off  I had an extended discussion with the patient reviewing all relevant studies completed to  date and  lasting 25 minutes of a 40  minute office visit /transition of care for non-specific but potentially very serious refractory respiratory symptoms of unknown etiology which have lead to 2 admits/ 4 er trips in last 6 months incorrectly labeled as copd exac  Each maintenance medication was reviewed in detail including most importantly the difference between maintenance and prns and under what circumstances the prns are to be triggered using an action plan format that is not reflected in the computer generated alphabetically organized AVS.    Please see AVS for specific instructions unique to this office visit that I personally wrote and verbalized to the the pt in detail and then reviewed with pt  by my nurse highlighting any changes in therapy/plan of care  recommended at today's visit.

## 2016-11-06 ENCOUNTER — Ambulatory Visit (INDEPENDENT_AMBULATORY_CARE_PROVIDER_SITE_OTHER): Payer: Medicare Other | Admitting: Internal Medicine

## 2016-11-06 ENCOUNTER — Encounter: Payer: Self-pay | Admitting: Internal Medicine

## 2016-11-06 VITALS — BP 102/66 | HR 73 | Ht 66.0 in | Wt 155.0 lb

## 2016-11-06 DIAGNOSIS — J4530 Mild persistent asthma, uncomplicated: Secondary | ICD-10-CM | POA: Diagnosis not present

## 2016-11-06 DIAGNOSIS — R058 Other specified cough: Secondary | ICD-10-CM

## 2016-11-06 DIAGNOSIS — I10 Essential (primary) hypertension: Secondary | ICD-10-CM

## 2016-11-06 DIAGNOSIS — R05 Cough: Secondary | ICD-10-CM | POA: Diagnosis not present

## 2016-11-06 MED ORDER — BUDESONIDE-FORMOTEROL FUMARATE 80-4.5 MCG/ACT IN AERO: 2.0000 | INHALATION_SPRAY | Freq: Two times a day (BID) | RESPIRATORY_TRACT | 11 refills | Status: DC

## 2016-11-06 MED ORDER — PREDNISONE 10 MG PO TABS: ORAL_TABLET | ORAL | 0 refills | Status: DC

## 2016-11-06 MED ORDER — FAMOTIDINE 20 MG PO TABS: ORAL_TABLET | ORAL | 2 refills | Status: DC
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
## 2016-11-06 NOTE — Assessment & Plan Note (Signed)
Although even in retrospect it may not be clear the ACEi contributed to the pt's symptoms,  Pt improved off them and adding them back at this point or in the future would risk confusion in interpretation of non-specific respiratory symptoms to which this patient is prone  ie  Better not to muddy the waters here.   Ok on diovan 80/12.5 daily

## 2016-11-06 NOTE — Assessment & Plan Note (Signed)
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    Please see AVS for specific instructions unique to this visit that I personally wrote and verbalized to the the pt in detail and then reviewed with pt  by my nurse highlighting any  changes in therapy recommended at today's visit to their plan of care.

## 2016-11-06 NOTE — Patient Instructions (Addendum)
Pantoprazole 40 = omeprazole 20 x2   Ok to use them up but Take 30 min before bfast daily regardless of which one you pick   Pepcid 20 mg at bedtime  Plan A = Automatic = symbiocort 80 Take 2 puffs first thing in am and then another 2 puffs about 12 hours later.     Work on inhaler technique:  relax and gently blow all the way out then take a nice smooth deep breath back in, triggering the inhaler at same time you start breathing in.  Hold for up to 5 seconds if you can. Blow out thru nose. Rinse and gargle with water when done      Plan B = Backup Only use your albuterol as a rescue medication to be used if you can't catch your breath by resting or doing a relaxed purse lip breathing pattern.  - The less you use it, the better it will work when you need it. - Ok to use the inhaler up to 2 puffs  every 4 hours if you must but call for appointment if use goes up over your usual need - Don't leave home without it !!  (think of it like the spare tire for your car)   Plan C = Crisis - only use your albuterol nebulizer if you first try Plan B and it fails to help > ok to use the nebulizer up to every 4 hours but if start needing it regularly call for immediate appointment  If get worse > Prednisone 10 mg take  4 each am x 2 days,   2 each am x 2 days,  1 each am x 2 days and stop    Please see patient coordinator before you leave today  to schedule ENT eval   See Tammy NP w/in 4  weeks with all your medications, even over the counter meds, separated in two separate bags, the ones you take no matter what vs the ones you stop once you feel better and take only as needed when you feel you need them.   Tammy  will generate for you a new user friendly medication calendar that will put [place] all on the same page re: your medication use.     Without this process, it simply isn't possible to assure that we are providing  your outpatient care  with  the attention to detail we feel you deserve.   If we  cannot assure that you're getting that kind of care,  then we cannot manage your problem effectively from this clinic.  Once you have seen Tammy and we are sure that we're all on the same page with your medication use she will arrange follow up with me.

## 2016-11-06 NOTE — Assessment & Plan Note (Signed)
10/30/2016    try reduce symb to 80 2bid  - 11/06/2016  After extensive coaching HFA effectiveness =    75%  All goals of chronic asthma control met including optimal function and elimination of symptoms with minimal need for rescue therapy.  Contingencies discussed in full including contacting this office immediately if not controlling the symptoms using the rule of two's.     Still easily confused with details of care.  To keep things simple, I have asked the patient to first separate medicines that are perceived as maintenance, that is to be taken daily "no matter what", from those medicines that are taken on only on an as-needed basis and I have given the patient examples of both, and then return to see our NP to generate a  detailed  medication calendar which should be followed until the next physician sees the patient and updates it.

## 2016-11-11 ENCOUNTER — Telehealth: Payer: Self-pay | Admitting: Internal Medicine

## 2016-11-11 MED ORDER — BUDESONIDE-FORMOTEROL FUMARATE 80-4.5 MCG/ACT IN AERO: 2.0000 | INHALATION_SPRAY | Freq: Two times a day (BID) | RESPIRATORY_TRACT | 11 refills | Status: DC

## 2016-11-11 NOTE — Telephone Encounter (Signed)
Spoke with Leslie-Rx was printed and patient was to take to the [place]. Pt states he left Rx here with a nurse-unable to tell me who or describe them to me-states he gave all information to the nurse that let him go.    [facility] information[facility]  ATTN: Riccardo Dubin    Fax number: [phone]  Pt is aware that I have faxed the Rx to above person. Nothing more needed at this time.

## 2016-11-16 ENCOUNTER — Telehealth: Payer: Self-pay | Admitting: Internal Medicine

## 2016-11-16 MED ORDER — BUDESONIDE-FORMOTEROL FUMARATE 80-4.5 MCG/ACT IN AERO: 2.0000 | INHALATION_SPRAY | Freq: Two times a day (BID) | RESPIRATORY_TRACT | 11 refills | Status: DC

## 2016-11-16 NOTE — Telephone Encounter (Signed)
Pt states that the [place] did not get the Rx for the Symbicort 66mcg - requests that this be faxed again.  Rx re-printed and given to [facility] for Dr Melvyn Novas to sign. This will need to be faxed again to [place] fax [phone] ATTN: Riccardo Dubin Will send to [facility] as she has this Rx.

## 2016-11-16 NOTE — Telephone Encounter (Signed)
Rx signed and faxed to the number given attn: [name] Geni Bers

## 2016-11-25 DIAGNOSIS — R061 Stridor: Secondary | ICD-10-CM | POA: Insufficient documentation

## 2016-12-09 ENCOUNTER — Encounter: Payer: Self-pay | Admitting: Adult Health

## 2016-12-16 ENCOUNTER — Encounter: Payer: Self-pay | Admitting: Adult Health

## 2016-12-17 DIAGNOSIS — Z430 Encounter for attention to tracheostomy: Secondary | ICD-10-CM | POA: Insufficient documentation

## 2016-12-23 IMAGING — CR DG CHEST 2V
2 series · 2 of 2 positions shown · non-contrast
Comparison: PA and lateral chest 03/05/2015 and 12/24/2011.

CLINICAL DATA: Shortness of breath and productive cough for 3
weeks.

EXAM:
CHEST  2 VIEW

[w chest pa]
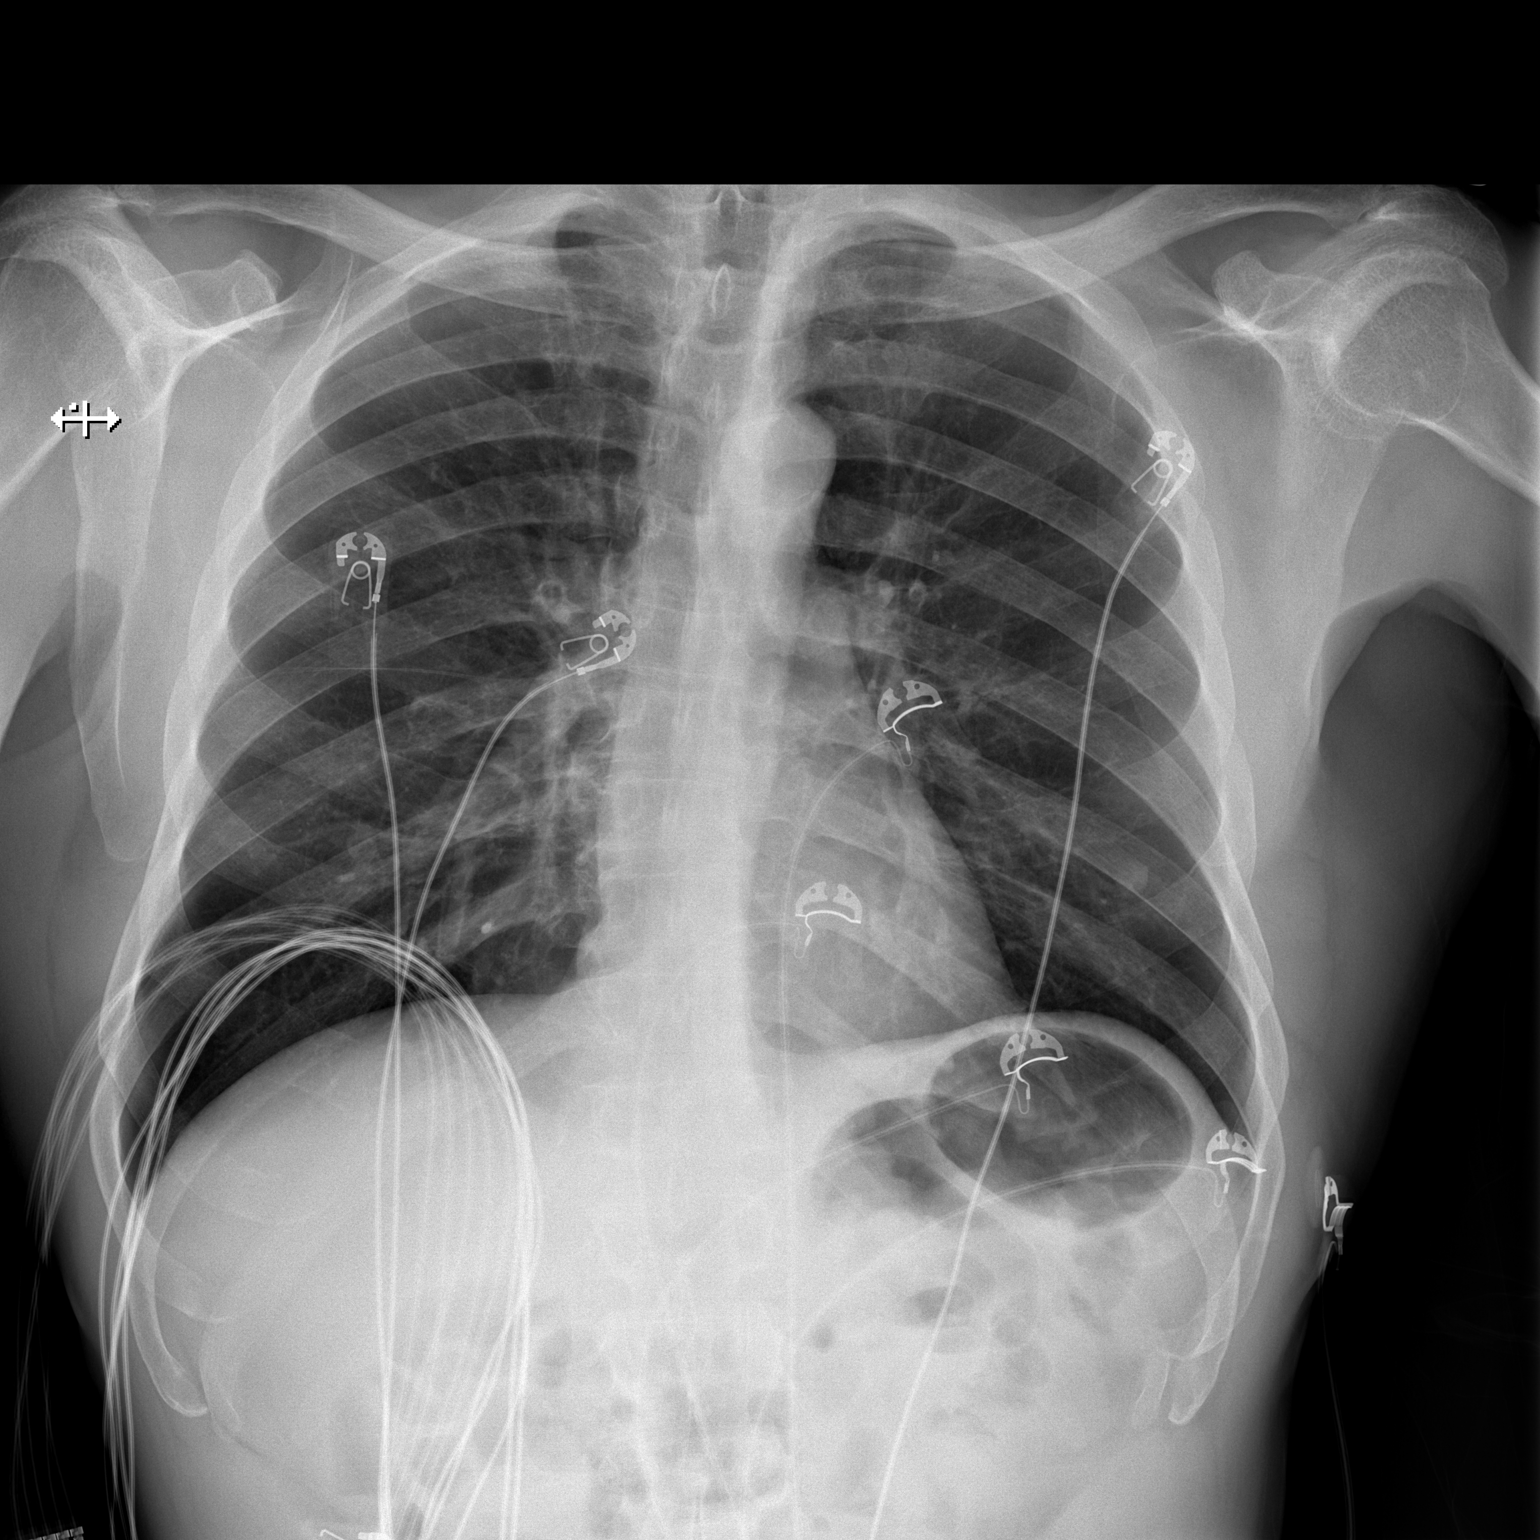

[w chest lat]
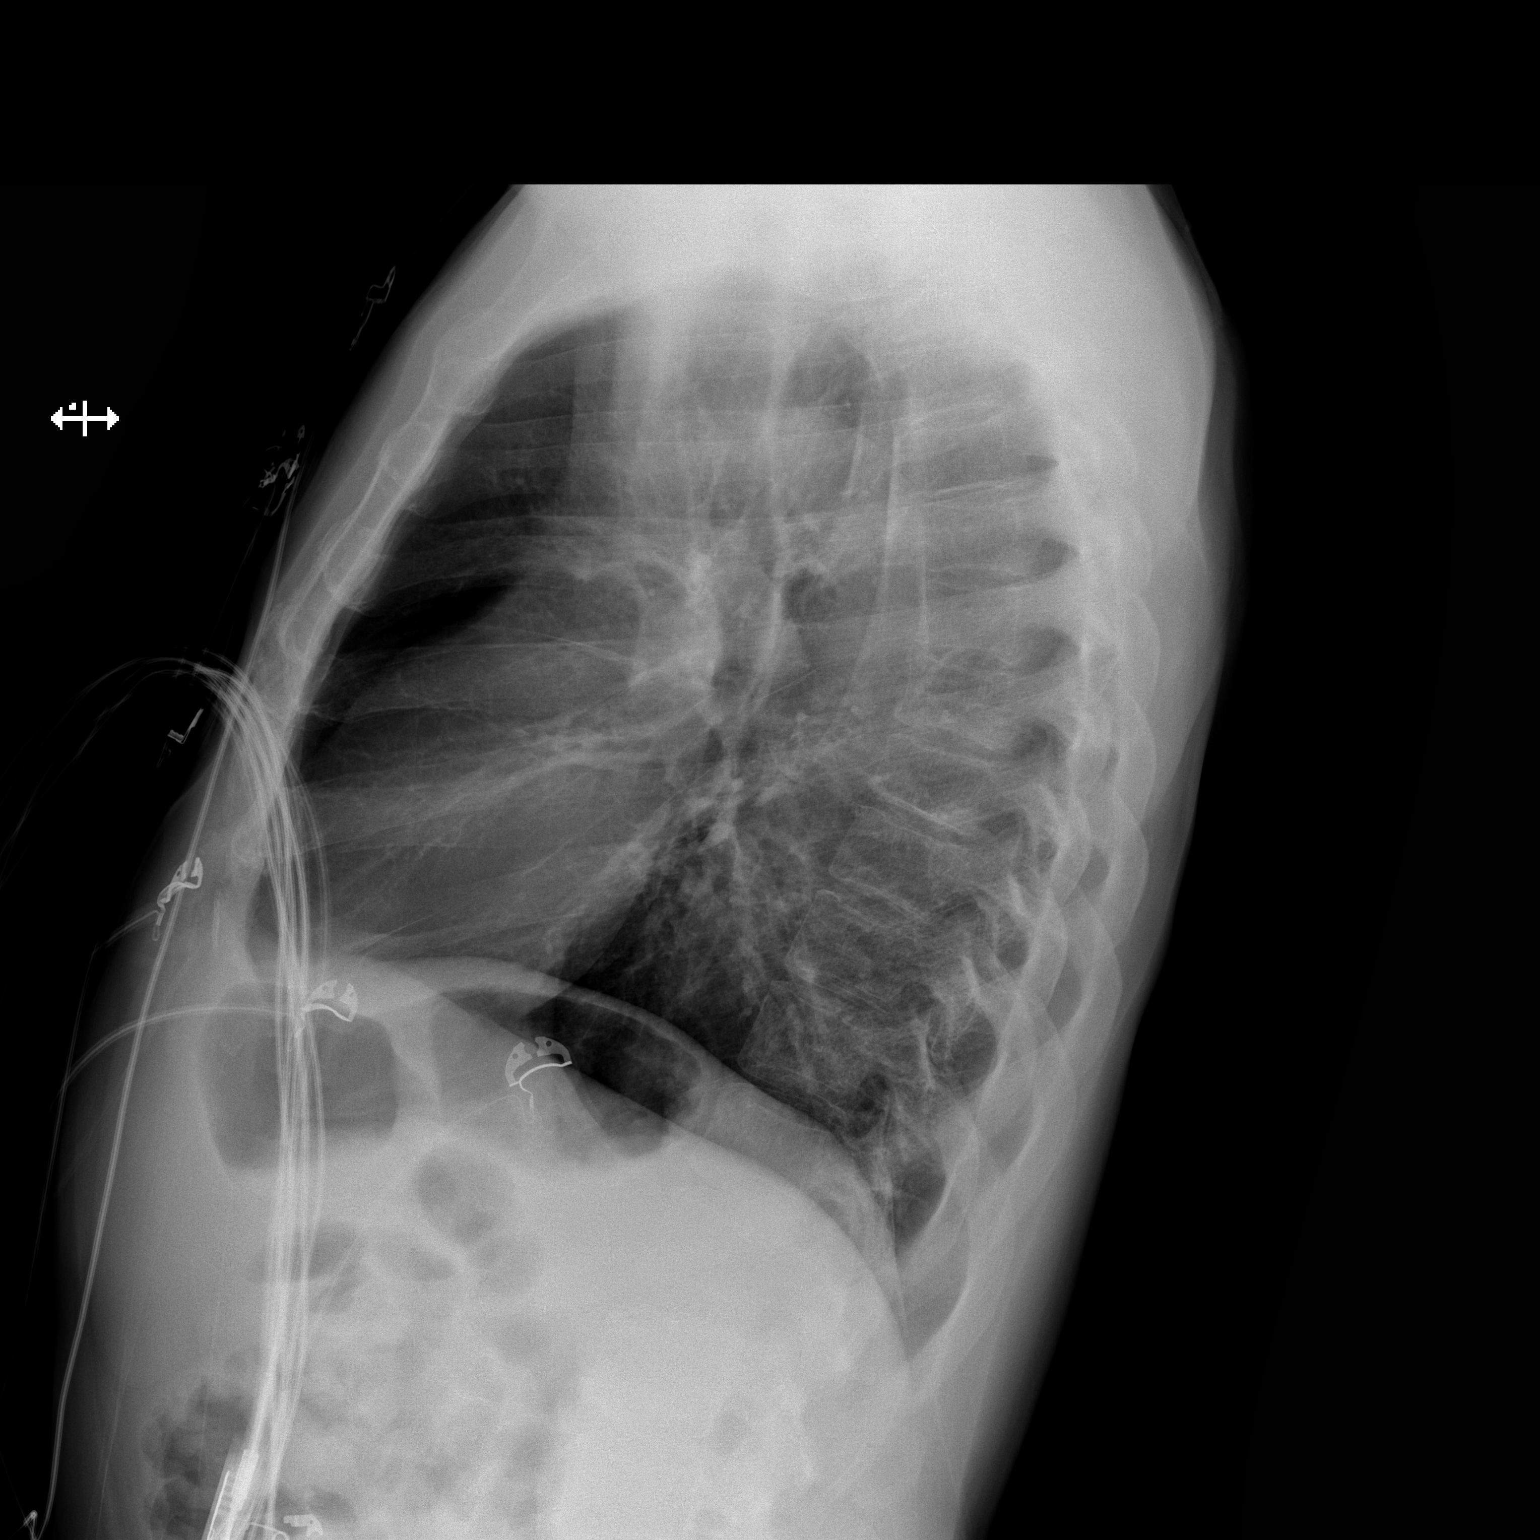

[2 of 2 positions shown; findings below may reference images not displayed]

FINDINGS: The lungs are clear. Heart size is normal. No pneumothorax or
pleural effusion. No focal bony abnormality. Nodular opacity
projecting over the left lower chest is consistent with the
patient's nipple and unchanged since 3473.
IMPRESSION: No acute disease.

## 2016-12-27 ENCOUNTER — Other Ambulatory Visit: Payer: Self-pay | Admitting: Internal Medicine

## 2016-12-30 IMAGING — CR DG CHEST 2V
2 series · 2 of 2 positions shown · non-contrast
Comparison: 03/11/2015 and earlier.

CLINICAL DATA: Acute onset of cough and shortness of breath.
Current history of hypertension.

EXAM:
CHEST  2 VIEW

[w chest lat]
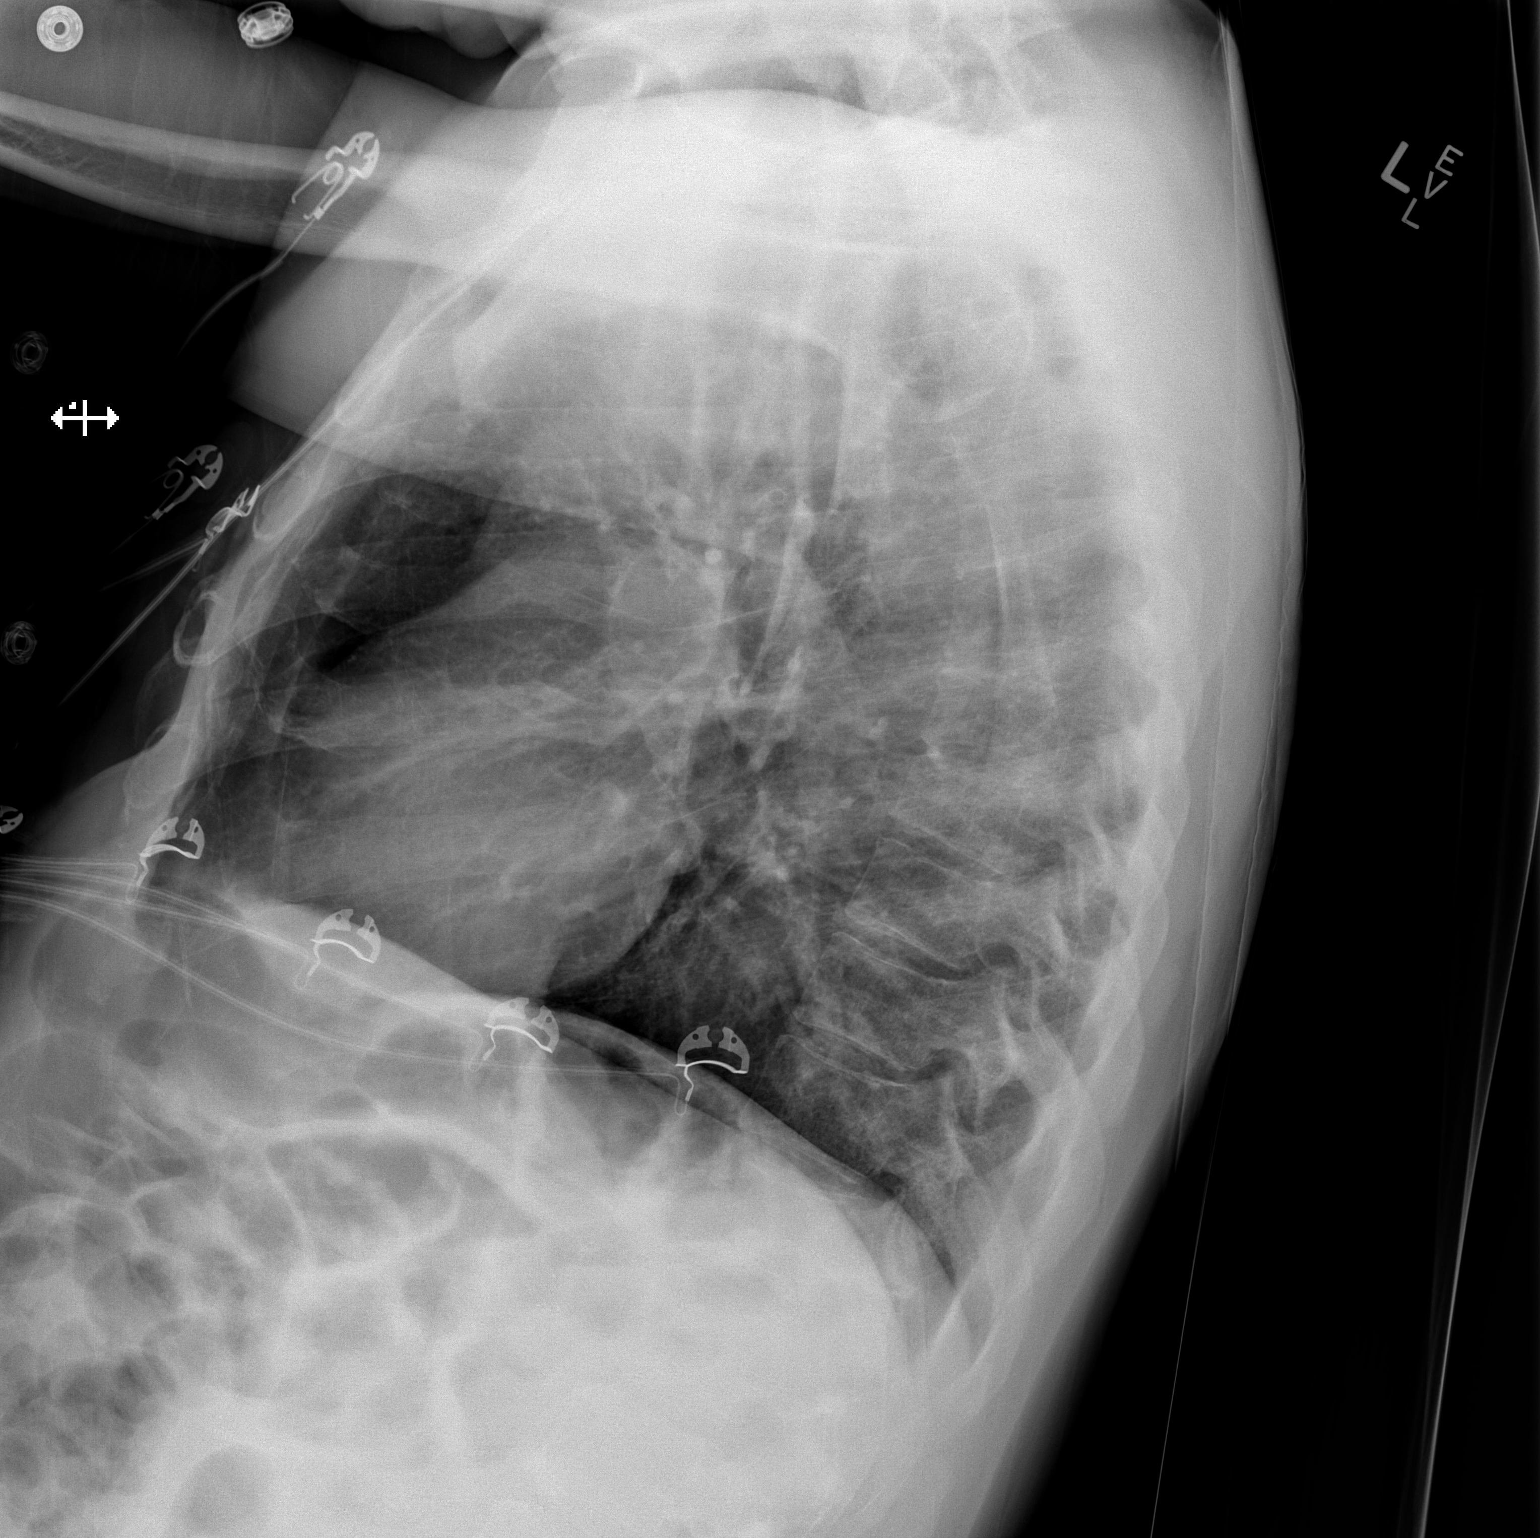

[x chest ap]
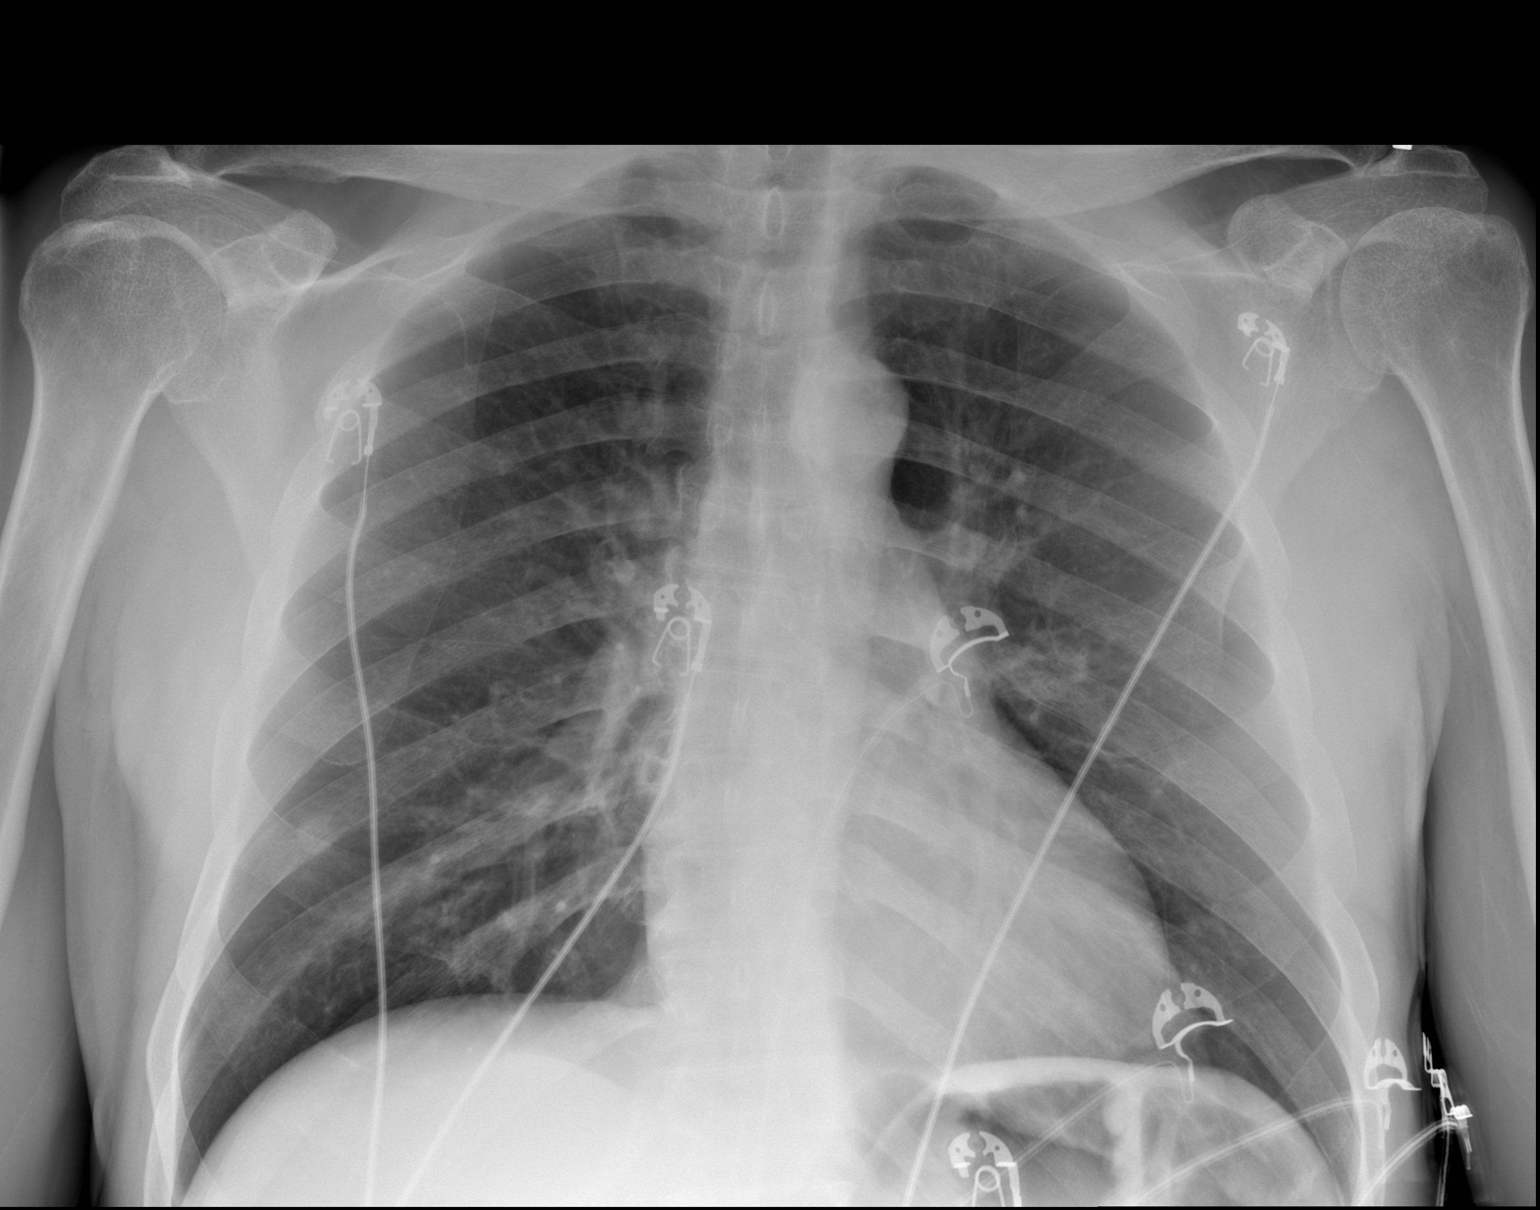

[2 of 2 positions shown; findings below may reference images not displayed]

FINDINGS: AP semi-erect and lateral images were obtained. Cardiomediastinal
silhouette unremarkable, unchanged. Nodular opacity projected over
the lateral right lung base was shown on prior examinations to
represent the nipple shadow. Mild central peribronchial thickening,
more so than on the prior examinations. Lungs otherwise clear. No
localized airspace consolidation. No pleural effusions. No
pneumothorax. Normal pulmonary vascularity. Mild degenerative
changes involving the thoracic spine and slight thoracic scoliosis
convex right, unchanged.
IMPRESSION: Mild changes of acute bronchitis and/or asthma without focal
airspace pneumonia.

## 2016-12-30 IMAGING — CT CT ANGIO CHEST
2 of 7 series · 16 of 36 positions shown · IV contrast (OMNIPAQUE 350)
Comparison: Plain film of earlier today.  No prior CT.

CLINICAL DATA: Shortness of breath and excessive cough. History of
asthma/ COPD.

EXAM:
CT ANGIOGRAPHY CHEST WITH CONTRAST
TECHNIQUE: Multidetector CT imaging of the chest was performed using the
standard protocol during bolus administration of intravenous
contrast. Multiplanar CT image reconstructions and MIPs were
obtained to evaluate the vascular anatomy.
CONTRAST:  100mL OMNIPAQUE IOHEXOL 350 MG/ML SOLN

[Series 10: thins for pacs · axial · 0.59mm/px · z∈[+1365,+1596]mm · 15 of 265 slices shown]
[im 17/265  lung]
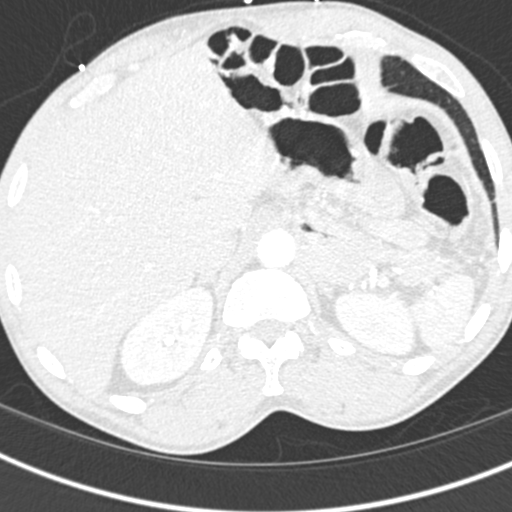
[im 34/265  mediastinal]
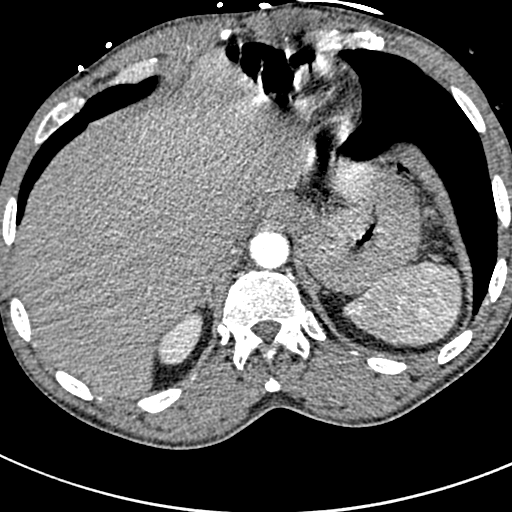
[im 50/265  lung]
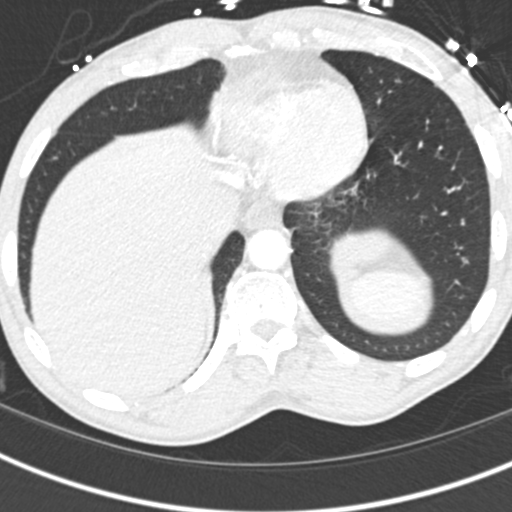
[im 67/265  mediastinal]
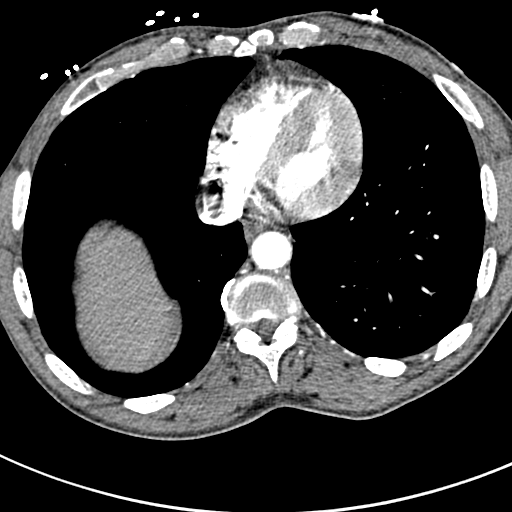
[im 83/265  lung]
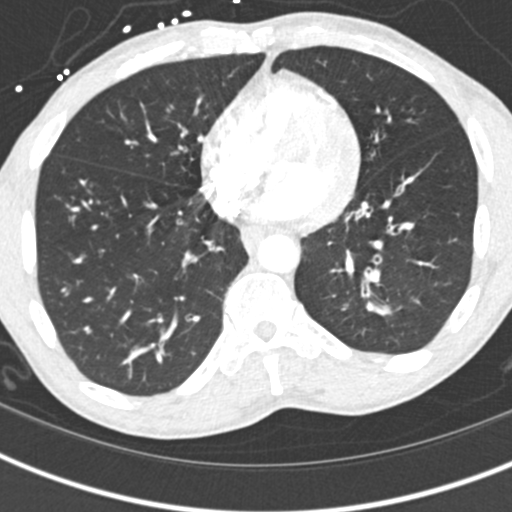
[im 100/265  mediastinal]
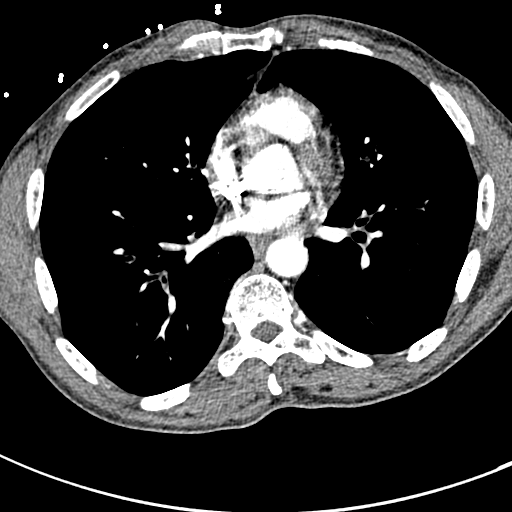
[im 116/265  lung]
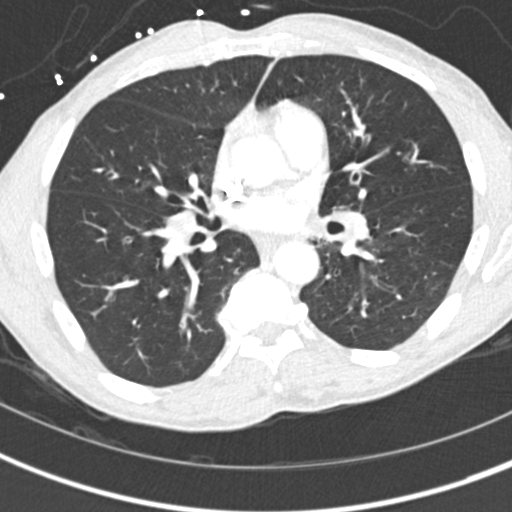
[im 133/265  mediastinal]
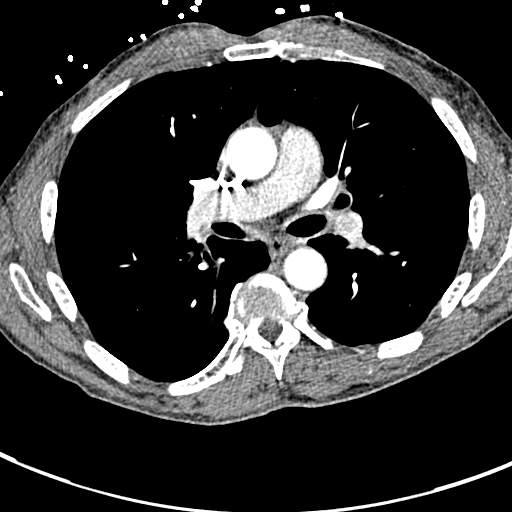
[im 149/265  lung]
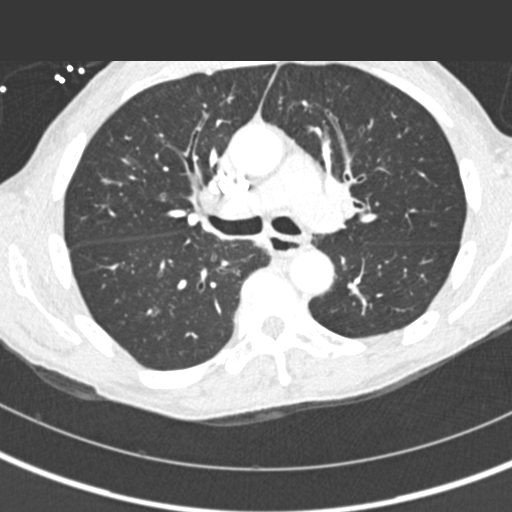
[im 166/265  mediastinal]
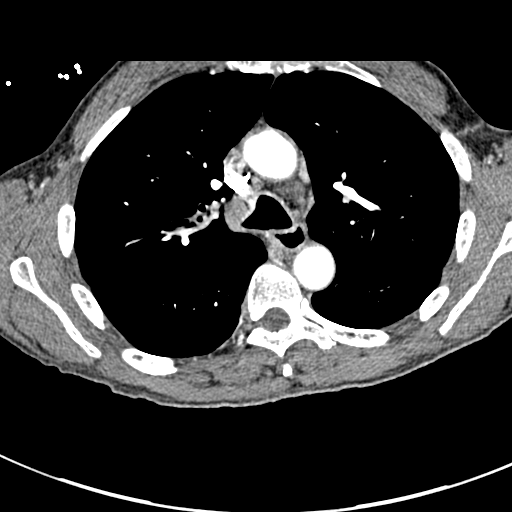
[im 182/265  lung]
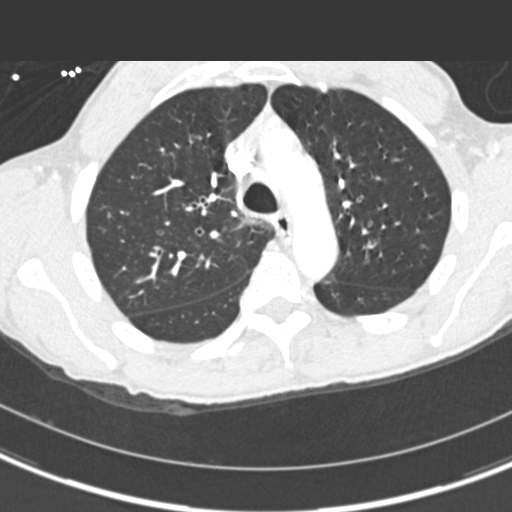
[im 199/265  mediastinal]
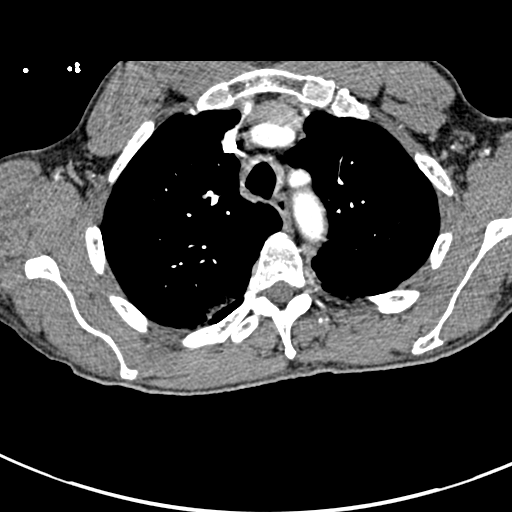
[im 215/265  lung]
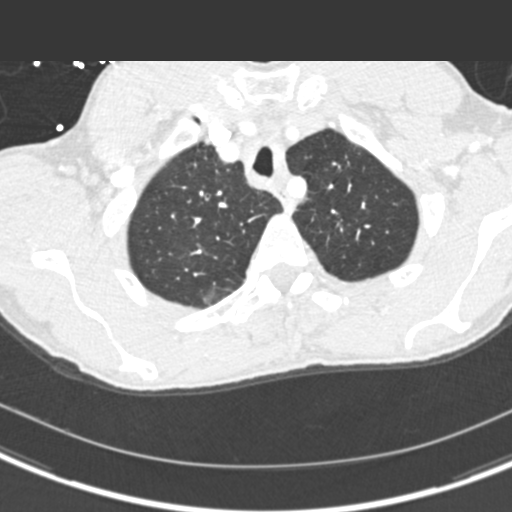
[im 232/265  mediastinal]
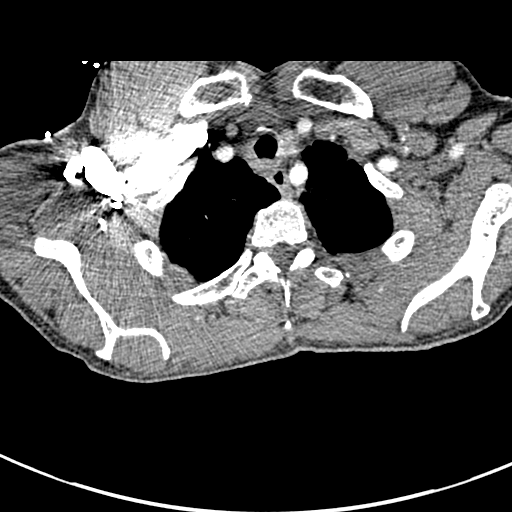
[im 248/265  lung]
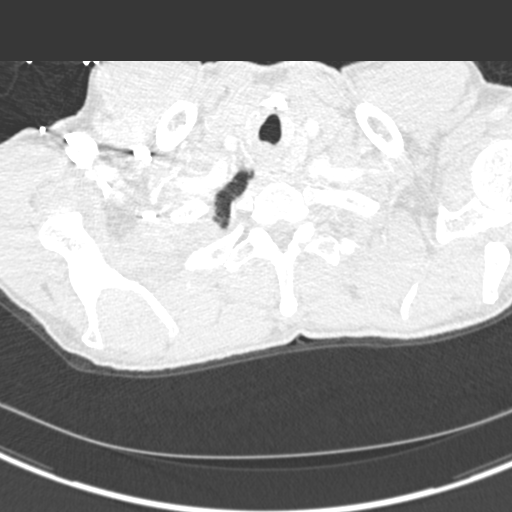

[Series 12: coronal mpr · coronal · 0.53mm/px · 1 of 131 slices shown]
[im 66/131  mediastinal]
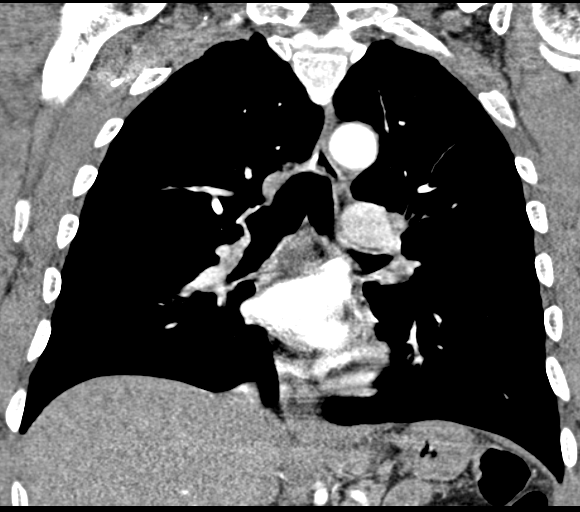

[16 of 36 positions shown; findings below may reference images not displayed]

FINDINGS: Mediastinum/Nodes: The quality of this exam for evaluation of
pulmonary embolism is poor. Despite 2 attempts, the bolus is poorly
timed, with contrast both in the aorta and SVC. No central or large
lobar pulmonary embolism. Smaller emboli cannot be excluded.

Bovine arch. Aortic and branch vessel atherosclerosis. Normal heart
size, without pericardial effusion. No mediastinal or hilar
adenopathy.

Lungs/Pleura: No pleural fluid. Lower lobe predominant bronchial
wall thickening. Mild to moderate centrilobular emphysema. Biapical
pleural thickening.

Minimal dependent posterior right upper lobe opacity is favored to
represent atelectasis.

Upper abdomen: Normal imaged portions of the liver, spleen, stomach,
pancreas, adrenal glands, kidneys.

Musculoskeletal: No acute osseous abnormality. Convex right thoracic
spine curvature.

Review of the MIP images confirms the above findings.
IMPRESSION: 1. Suboptimal exam, secondary to the poor bolus timing, despite 2
attempts. No central or large lobar embolism.
2. Centrilobular emphysema, without other explanation for patient's
symptoms.

## 2017-01-01 IMAGING — CR DG CHEST 2V
2 series · 2 of 2 positions shown · non-contrast
Comparison: CT dated 03/18/2015

CLINICAL DATA: 63-year-old male with shortness of breath and
productive cough

EXAM:
CHEST  2 VIEW

[w chest pa]
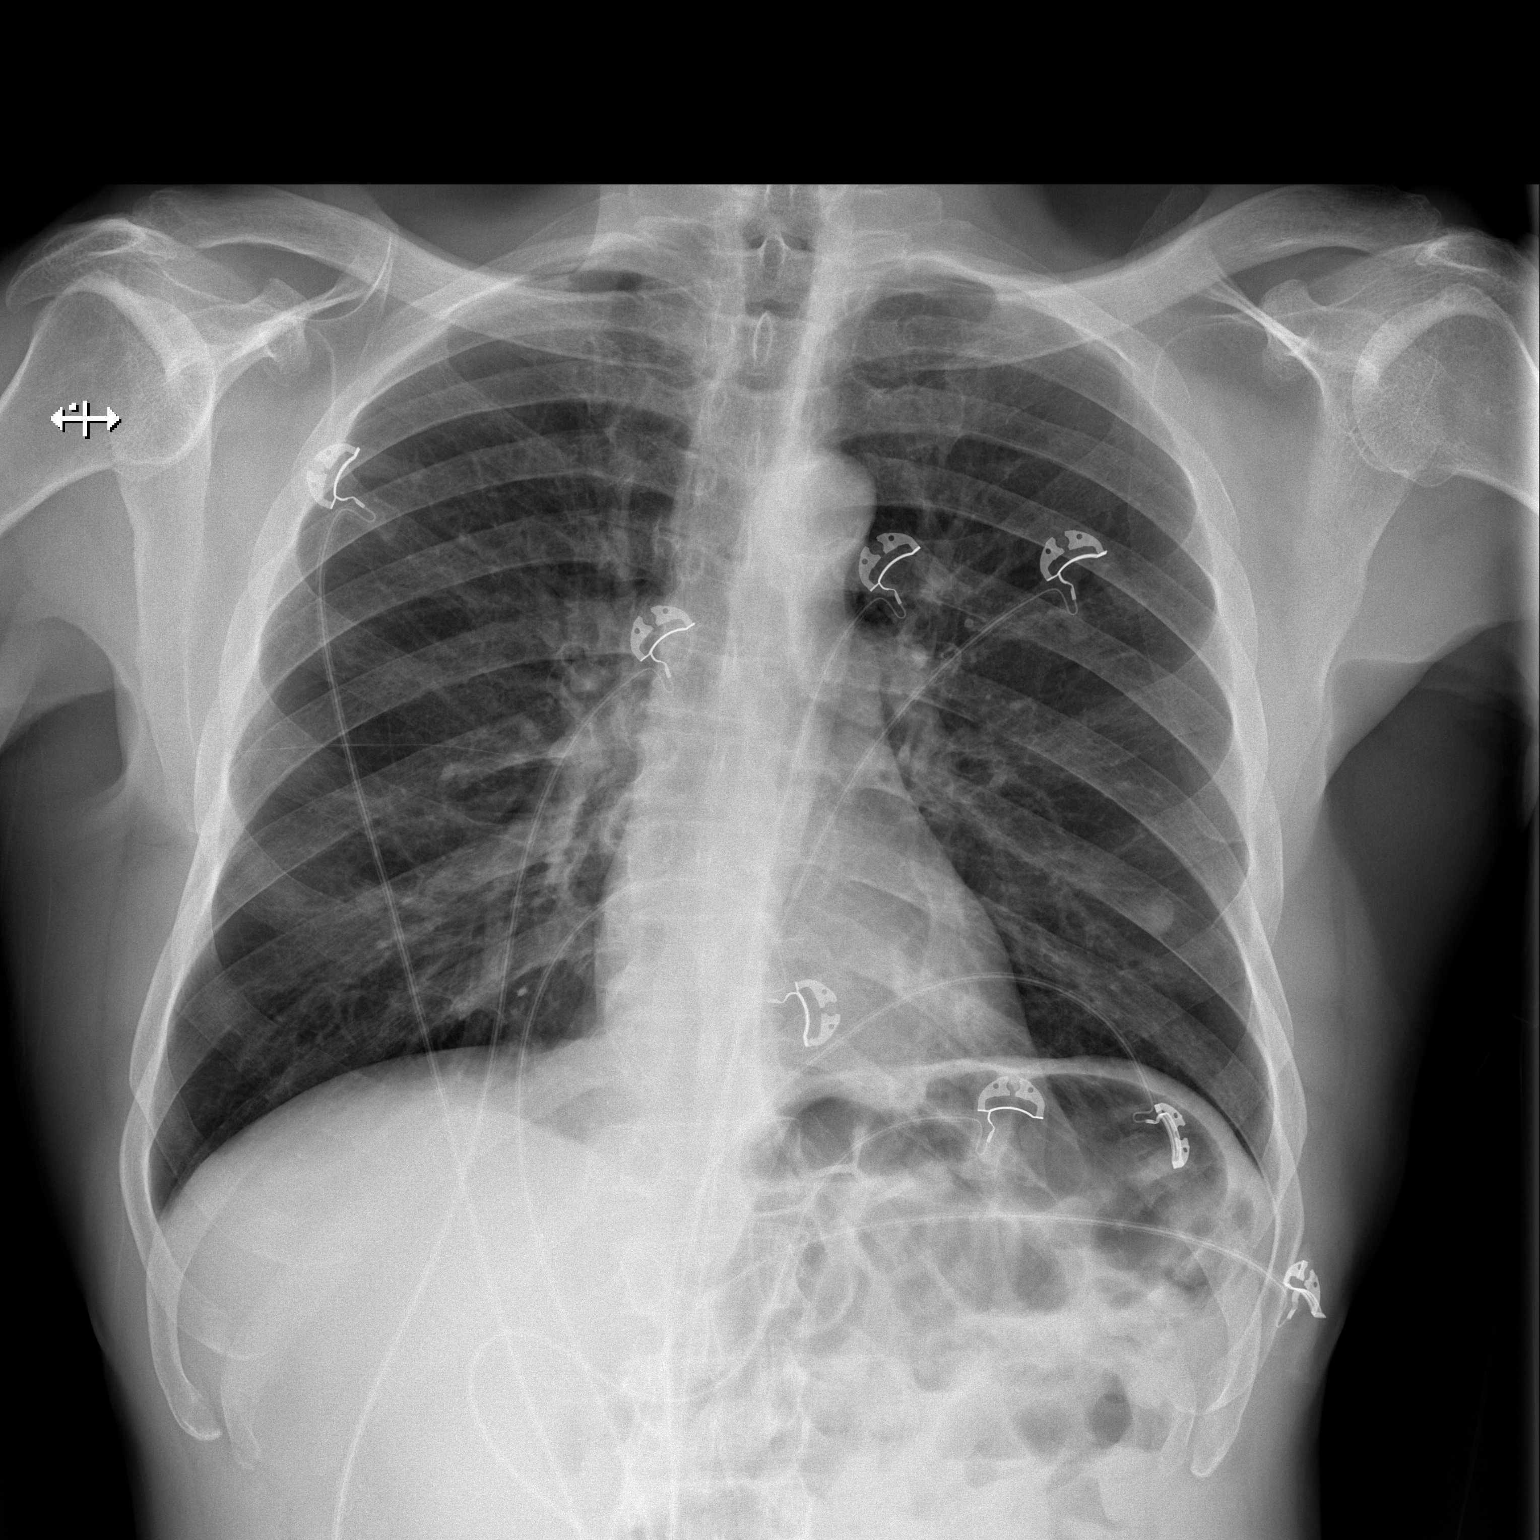

[w chest lat]
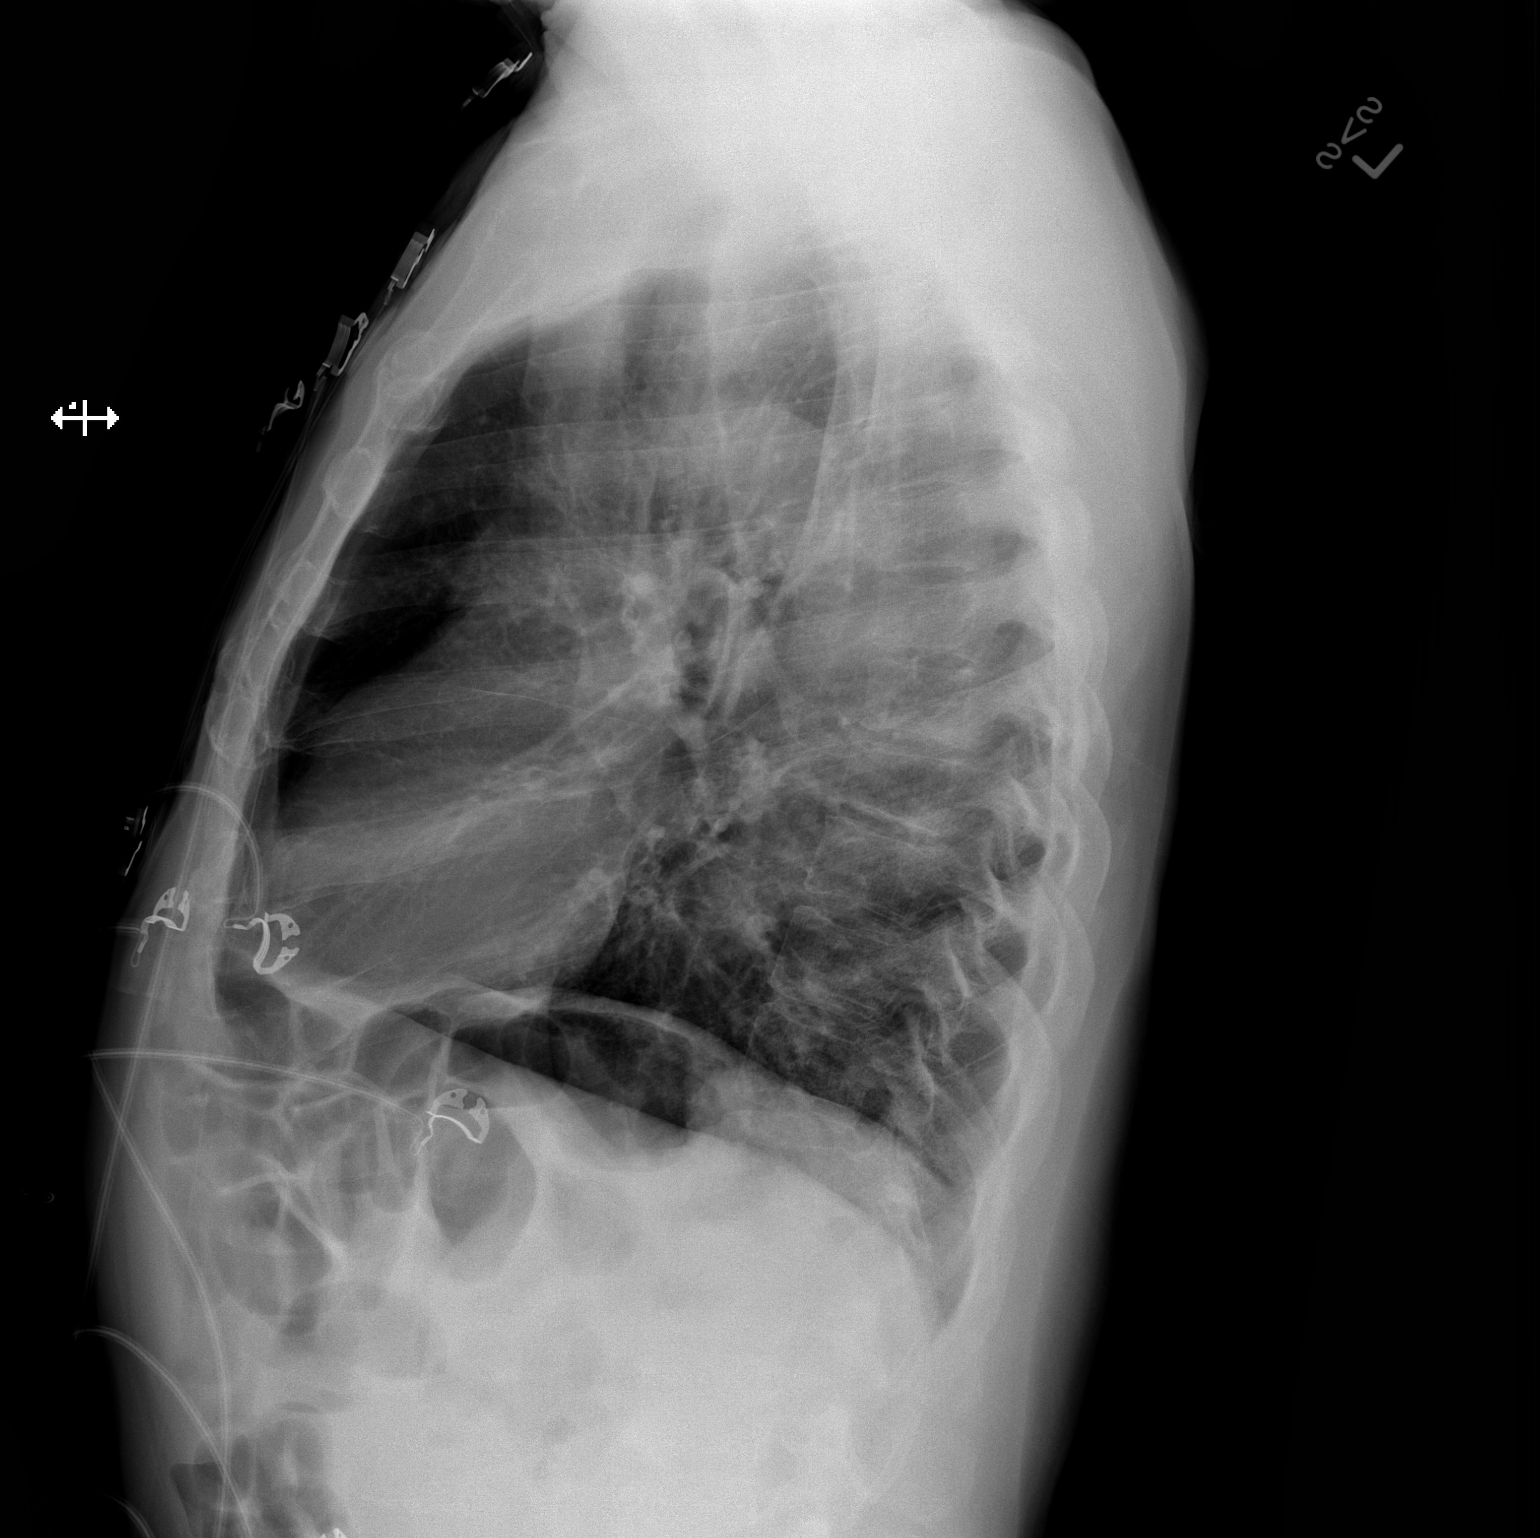

[2 of 2 positions shown; findings below may reference images not displayed]

FINDINGS: Two views of the chest demonstrate emphysematous changes of the
lungs. No focal consolidation, pleural effusion, or pneumothorax.
Subcentimeter nodular appearing densities over the lower lung fields
bilaterally correspond to the nipples. The cardiomediastinal
silhouette is within normal limits. The visualized osseous
structures are grossly unremarkable.
IMPRESSION: Emphysema.  No focal consolidation.

## 2017-01-04 NOTE — Addendum Note (Signed)
Addended by: Lorane Gell on: 01/04/2017 04:33 PM   Modules accepted: Level of Service

## 2017-01-10 IMAGING — DX DG CHEST 2V
2 series · 2 of 2 positions shown · non-contrast
Comparison: 03/20/2015

CLINICAL DATA: Shortness of breath with productive cough, wheezing,
and chest congestion over 2 weeks.

EXAM:
CHEST  2 VIEW

[chest pa]
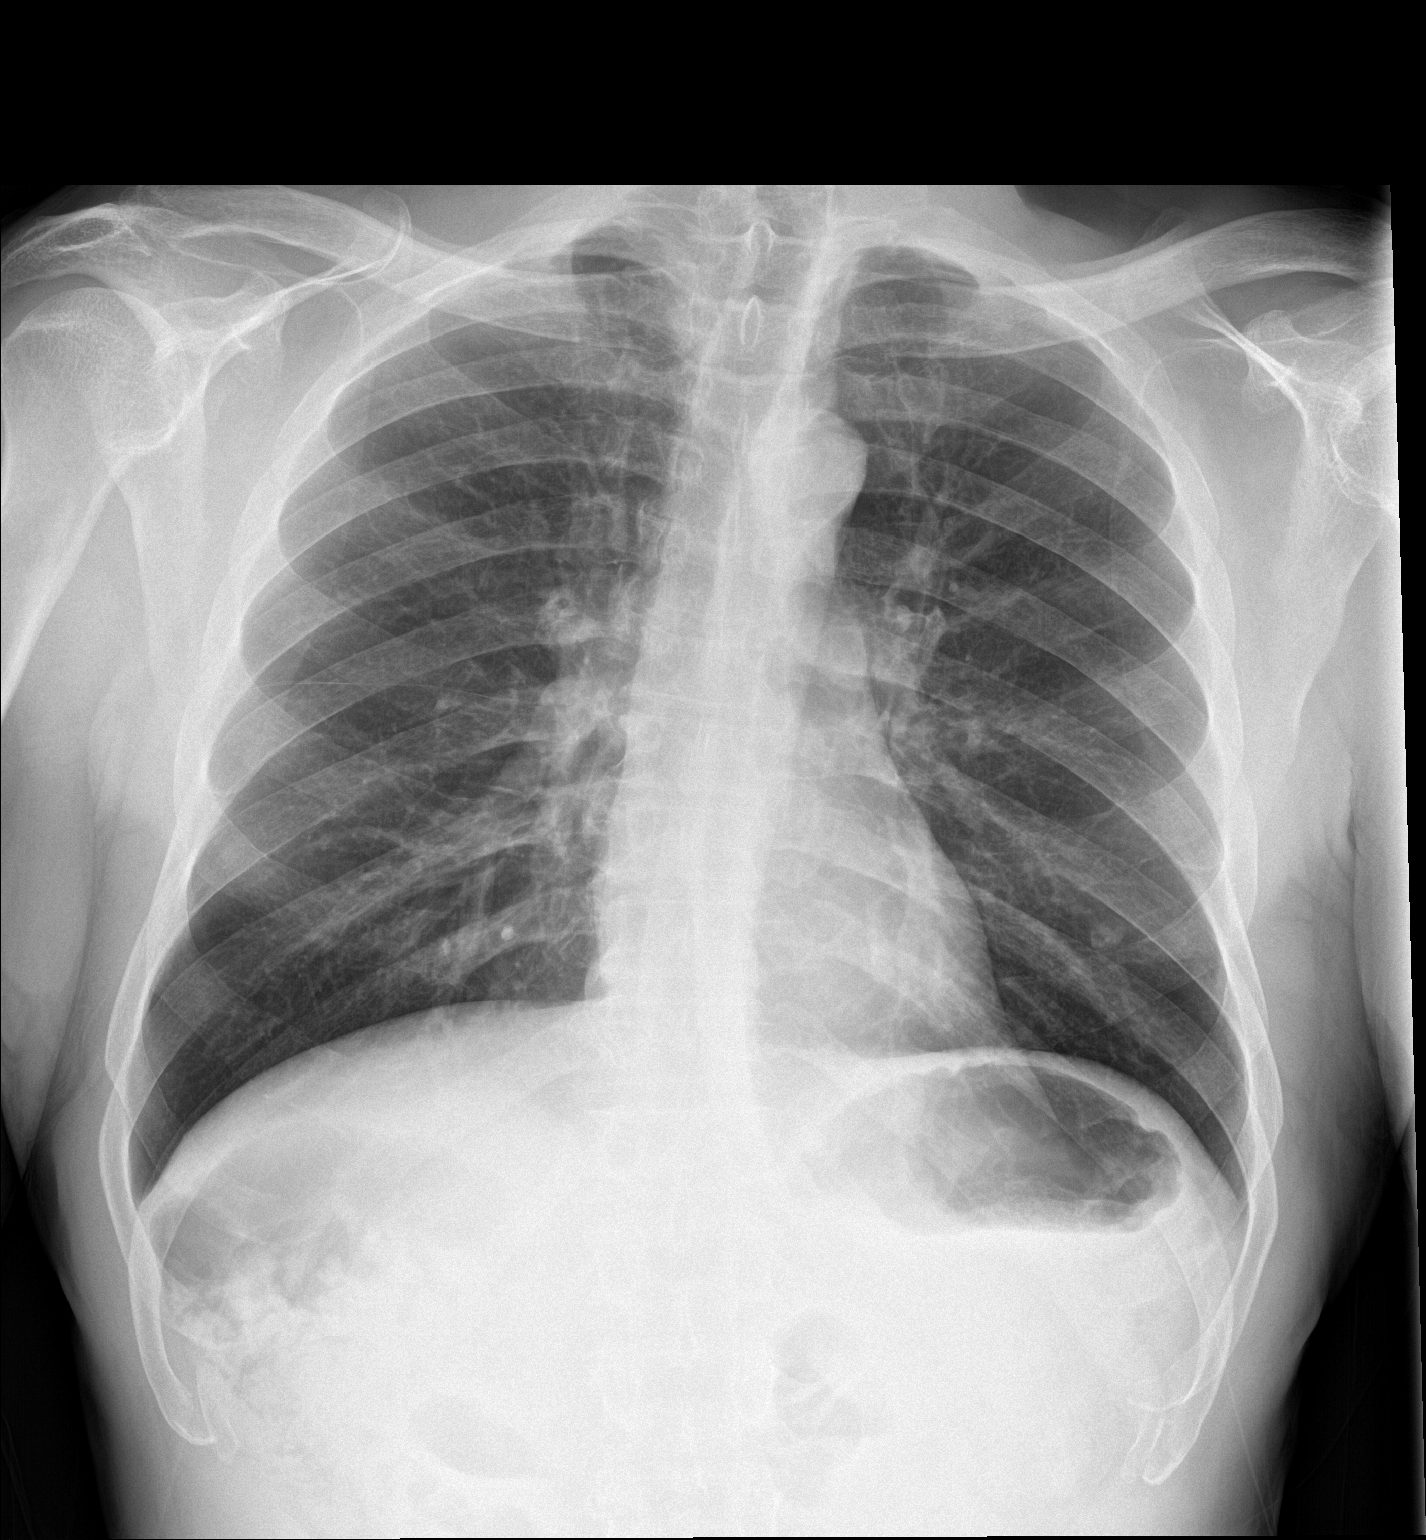

[chest lat]
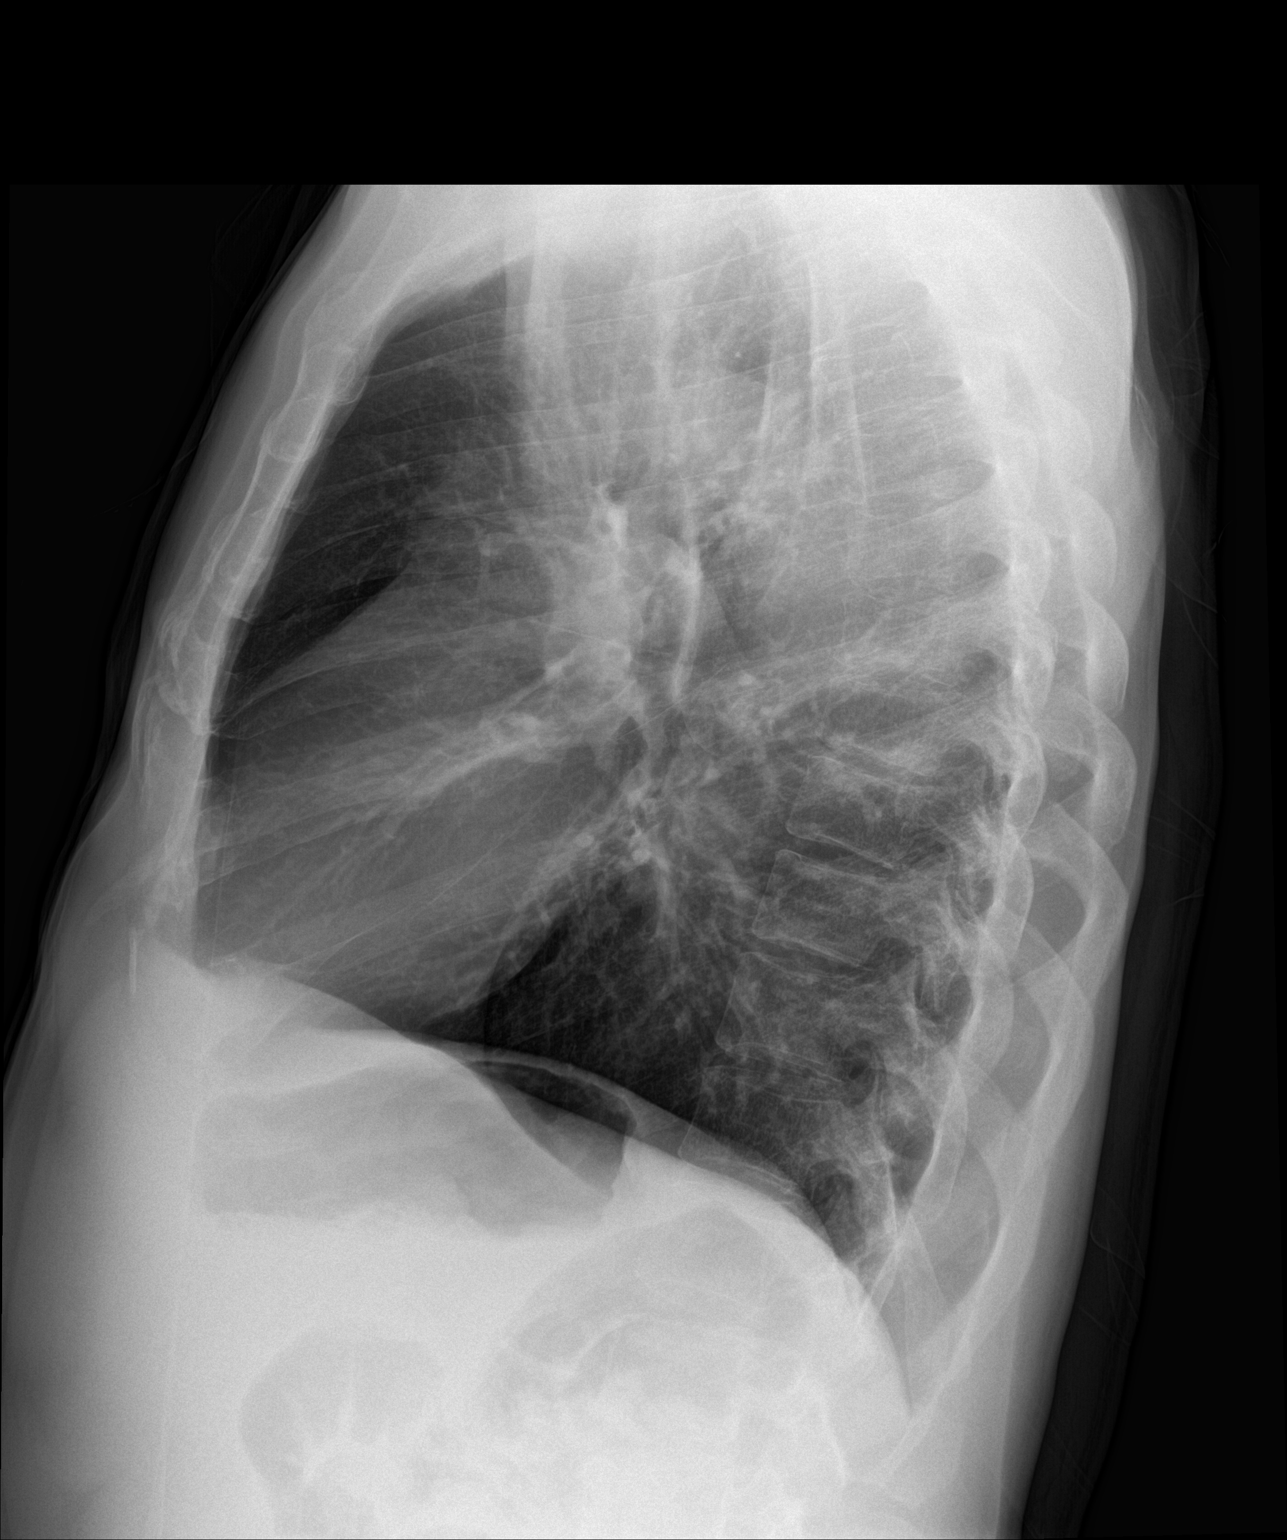

[2 of 2 positions shown; findings below may reference images not displayed]

FINDINGS: Mild hyperinflation. The heart size and mediastinal contours are
within normal limits. Both lungs are clear. Nodular opacity
projected over the mid lungs probably due to prominent nipple
shadows and unchanged since prior study. The visualized skeletal
structures are unremarkable.
IMPRESSION: No active cardiopulmonary disease.

## 2017-01-14 IMAGING — DX DG CHEST 1V PORT
1 series · 1 of 1 positions shown · non-contrast
Comparison: Chest radiograph performed 03/29/2015, and CTA of the
chest performed 03/18/2015

CLINICAL DATA: Acute onset of shortness of breath. Initial
encounter.

EXAM:
PORTABLE CHEST - 1 VIEW

[chest ap]
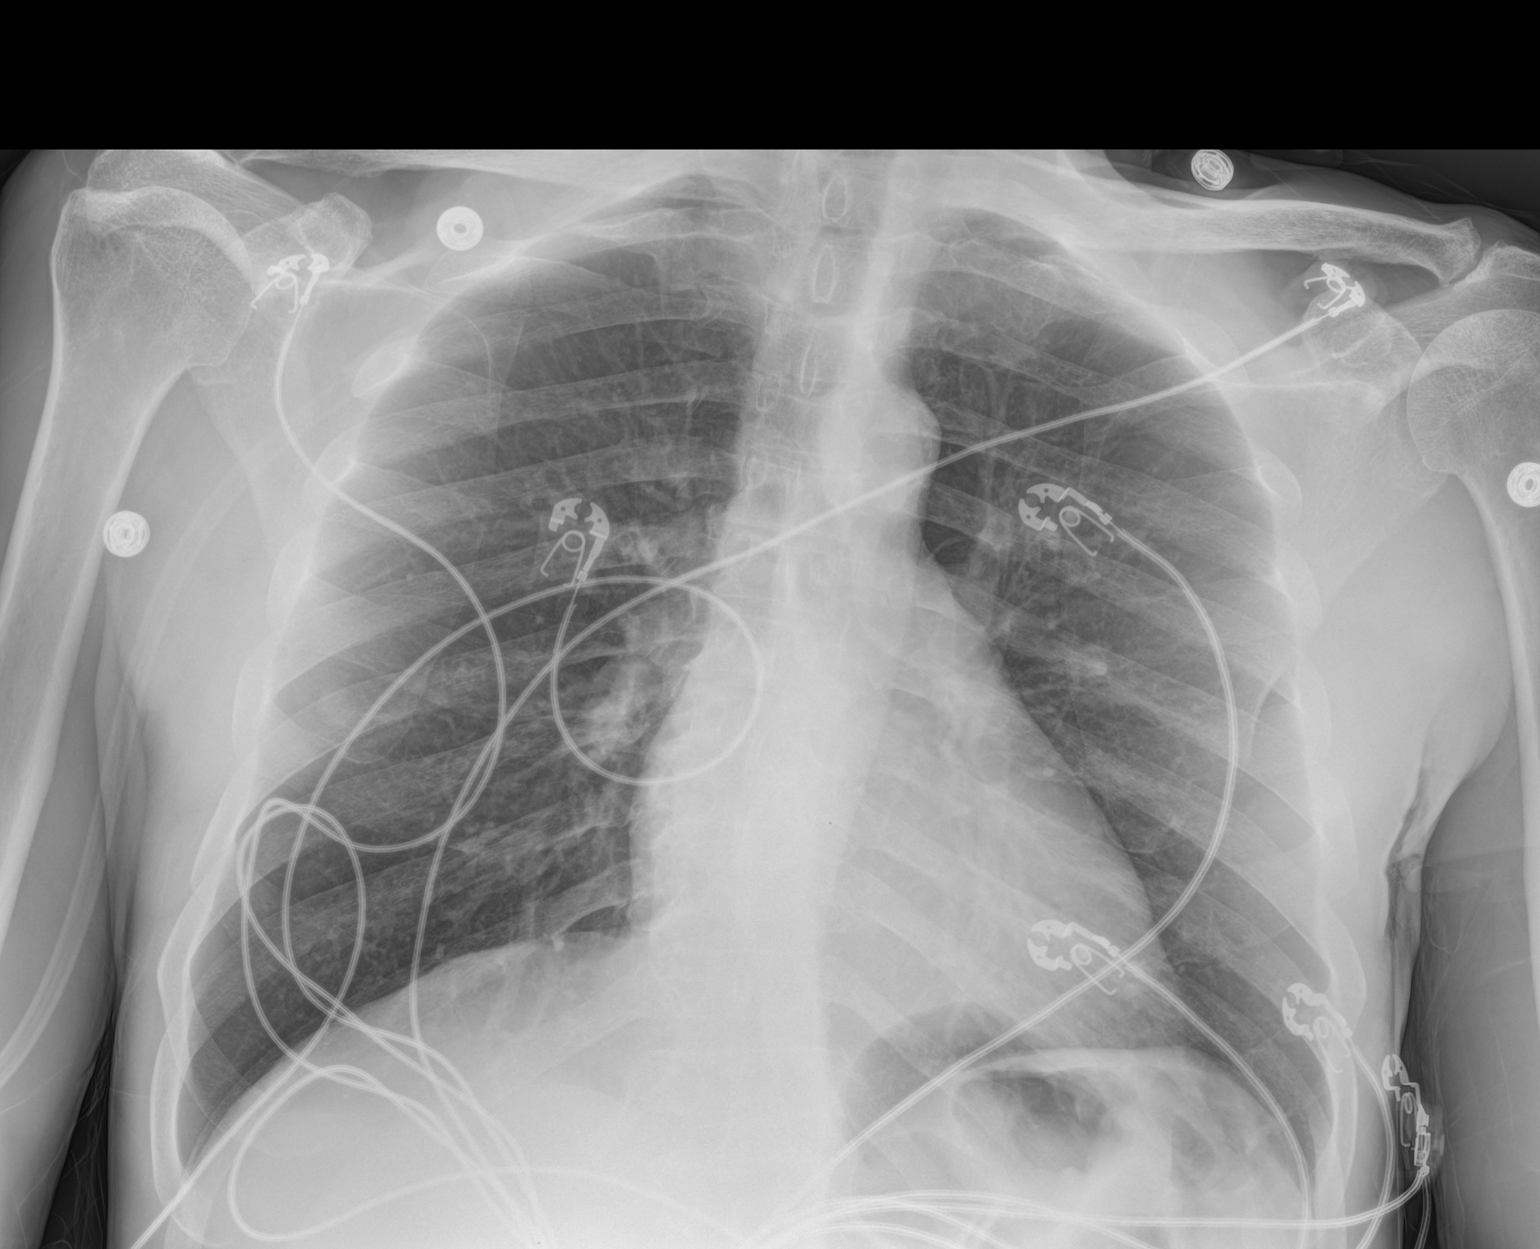

[1 of 1 positions shown; findings below may reference images not displayed]

FINDINGS: The lungs are well-aerated. Mild peribronchial thickening is noted.
A 1.2 cm nodular density at the right midlung zone reflects the
patient's nipple, on correlation with prior CTA. There is no
evidence of focal opacification, pleural effusion or pneumothorax.

The cardiomediastinal silhouette is within normal limits. No acute
osseous abnormalities are seen.
IMPRESSION: Mild peribronchial thickening noted; lungs otherwise clear.

## 2017-01-21 ENCOUNTER — Ambulatory Visit (INDEPENDENT_AMBULATORY_CARE_PROVIDER_SITE_OTHER): Payer: Medicare Other | Admitting: Internal Medicine

## 2017-01-21 ENCOUNTER — Encounter: Payer: Self-pay | Admitting: Internal Medicine

## 2017-01-21 VITALS — BP 118/78 | HR 67 | Ht 65.0 in | Wt 154.0 lb

## 2017-01-21 DIAGNOSIS — J4530 Mild persistent asthma, uncomplicated: Secondary | ICD-10-CM

## 2017-01-21 DIAGNOSIS — J3802 Paralysis of vocal cords and larynx, bilateral: Secondary | ICD-10-CM | POA: Diagnosis not present

## 2017-01-21 DIAGNOSIS — R05 Cough: Secondary | ICD-10-CM

## 2017-01-21 DIAGNOSIS — R058 Other specified cough: Secondary | ICD-10-CM

## 2017-01-21 NOTE — Assessment & Plan Note (Signed)
s/p Trach 4/418 [facility] > f/u planned

## 2017-01-21 NOTE — Assessment & Plan Note (Signed)
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

## 2017-01-21 NOTE — Patient Instructions (Signed)
Ok to use the 160 of symbicort 1-2 every 12 hours   Return here once they are finished with you at [facility]  - sooner if needed for respiratory problems but always try breathing with the plug out of the trach first to see whether the problem is your throat or your lungs
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
## 2017-01-21 NOTE — Assessment & Plan Note (Signed)
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 > referred to [facility] for second opinion> trach December 16 2016   So much better tempted to stop all his asthma meds but says plan is for surgery at [facility] and don't want to rock to boat with priority of correcting the underlying problem surgically if possible, then weaning the meds for reflux and asthma (see separate a/p)

## 2017-01-23 IMAGING — DX DG CHEST 1V PORT
1 series · 1 of 1 positions shown · non-contrast
Comparison: Radiographs 04/05/2015.  Chest CT 03/18/2015

CLINICAL DATA: Shortness of breath.

EXAM:
PORTABLE CHEST - 1 VIEW

[chest ap]
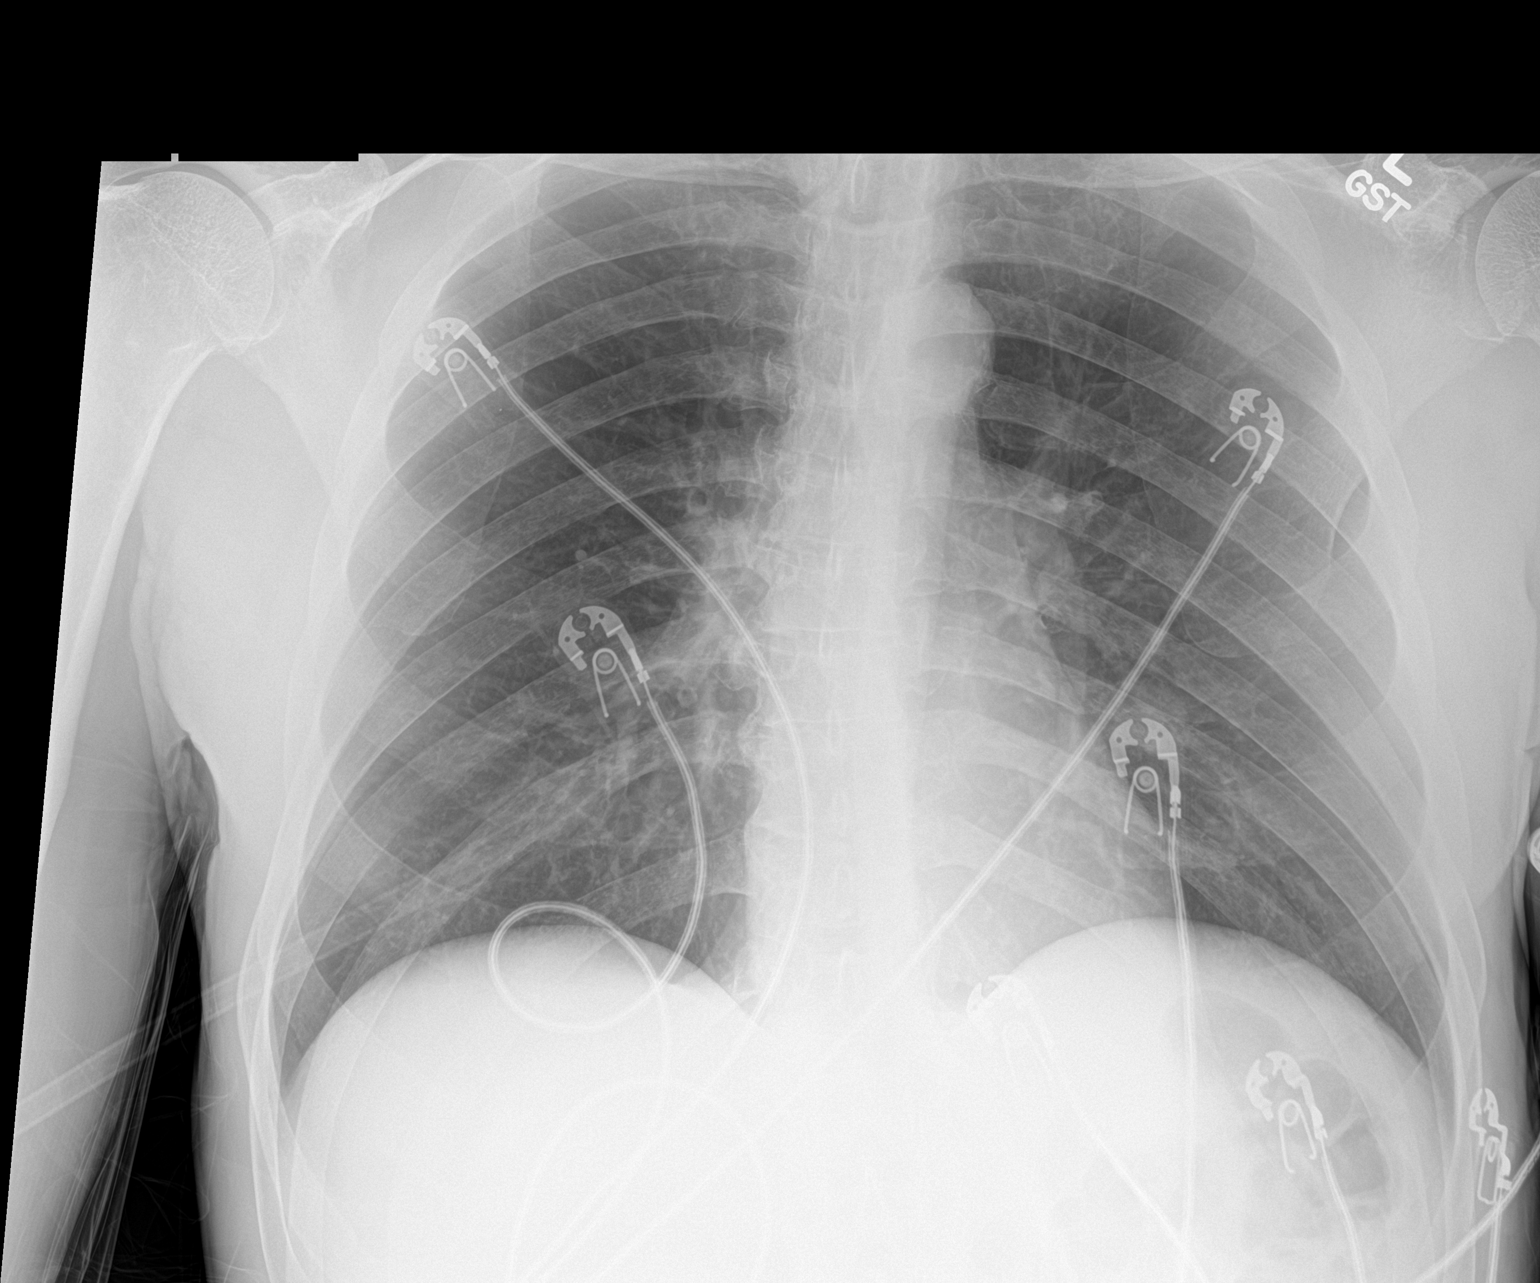

[1 of 1 positions shown; findings below may reference images not displayed]

FINDINGS: The cardiomediastinal contours are normal. Bronchial thickening and
central bronchitic change. Pulmonary vasculature is normal. No
consolidation, pleural effusion, or pneumothorax. No acute osseous
abnormalities are seen.
IMPRESSION: No acute pulmonary process.  Bronchial thickening is again seen.

## 2017-01-28 ENCOUNTER — Other Ambulatory Visit: Payer: Self-pay | Admitting: Internal Medicine

## 2017-01-29 ENCOUNTER — Other Ambulatory Visit: Payer: Self-pay | Admitting: Internal Medicine

## 2017-02-13 ENCOUNTER — Emergency Department (HOSPITAL_COMMUNITY)
Admission: EM | Admit: 2017-02-13 | Discharge: 2017-02-13 | Disposition: A | Discharge status: Home / Self Care | Payer: Medicare Other | Attending: Emergency Medicine | Admitting: Emergency Medicine

## 2017-02-13 ENCOUNTER — Encounter (HOSPITAL_COMMUNITY): Payer: Self-pay | Admitting: Emergency Medicine

## 2017-02-13 ENCOUNTER — Emergency Department (HOSPITAL_COMMUNITY): Payer: Medicare Other

## 2017-02-13 DIAGNOSIS — J40 Bronchitis, not specified as acute or chronic: Secondary | ICD-10-CM | POA: Diagnosis not present

## 2017-02-13 DIAGNOSIS — I10 Essential (primary) hypertension: Secondary | ICD-10-CM | POA: Diagnosis not present

## 2017-02-13 DIAGNOSIS — J441 Chronic obstructive pulmonary disease with (acute) exacerbation: Secondary | ICD-10-CM | POA: Diagnosis not present

## 2017-02-13 DIAGNOSIS — Z79899 Other long term (current) drug therapy: Secondary | ICD-10-CM | POA: Insufficient documentation

## 2017-02-13 DIAGNOSIS — R0602 Shortness of breath: Secondary | ICD-10-CM | POA: Diagnosis present

## 2017-02-13 LAB — BASIC METABOLIC PANEL
ANION GAP: 8 (ref 5–15)
BUN: 15 mg/dL (ref 6–20)
CHLORIDE: 102 mmol/L (ref 101–111)
CO2: 24 mmol/L (ref 22–32)
Calcium: 9.1 mg/dL (ref 8.9–10.3)
Creatinine, Ser: 0.86 mg/dL (ref 0.61–1.24)
GFR calc Af Amer: >60 mL/min (ref >60)
Glucose, Bld: 106 mg/dL — ABNORMAL HIGH (ref 65–99)
POTASSIUM: 3.6 mmol/L (ref 3.5–5.1)
SODIUM: 134 mmol/L — AB (ref 135–145)

## 2017-02-13 LAB — I-STAT CG4 LACTIC ACID, ED: LACTIC ACID, VENOUS: 0.83 mmol/L (ref 0.5–1.9)

## 2017-02-13 LAB — CBC
HCT: 40.8 % (ref 39.0–52.0)
HEMOGLOBIN: 13.8 g/dL (ref 13.0–17.0)
MCH: 30.4 pg (ref 26.0–34.0)
MCHC: 33.8 g/dL (ref 30.0–36.0)
MCV: 89.9 fL (ref 78.0–100.0)
PLATELETS: 255 10*3/uL (ref 150–400)
RBC: 4.54 MIL/uL (ref 4.22–5.81)
RDW: 14.1 % (ref 11.5–15.5)
WBC: 6.4 10*3/uL (ref 4.0–10.5)

## 2017-02-13 LAB — I-STAT TROPONIN, ED: TROPONIN I, POC: 0 ng/mL (ref 0.00–0.08)

## 2017-02-13 MED ORDER — ALBUTEROL SULFATE (2.5 MG/3ML) 0.083% IN NEBU
5.0000 mg | INHALATION_SOLUTION | Freq: Once | RESPIRATORY_TRACT | Status: AC
  Administered 2017-02-13: 5 mg via RESPIRATORY_TRACT
  Filled 2017-02-13: qty 6

## 2017-02-13 MED ORDER — DOXYCYCLINE HYCLATE 100 MG PO CAPS: 100.0000 mg | ORAL_CAPSULE | Freq: Two times a day (BID) | ORAL | 0 refills | Status: DC

## 2017-02-13 MED ORDER — IPRATROPIUM BROMIDE 0.02 % IN SOLN
0.5000 mg | Freq: Once | RESPIRATORY_TRACT | Status: AC
  Administered 2017-02-13: 0.5 mg via RESPIRATORY_TRACT
  Filled 2017-02-13: qty 2.5

## 2017-02-13 MED ORDER — METHYLPREDNISOLONE SODIUM SUCC 125 MG IJ SOLR
125.0000 mg | Freq: Once | INTRAMUSCULAR | Status: AC
  Administered 2017-02-13: 125 mg via INTRAVENOUS
  Filled 2017-02-13: qty 2

## 2017-02-13 MED ORDER — PREDNISONE 20 MG PO TABS: 40.0000 mg | ORAL_TABLET | Freq: Every day | ORAL | 0 refills | Status: DC
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
## 2017-02-13 NOTE — Discharge Instructions (Signed)
It was our pleasure to provide your ER care today - we hope that you feel better.  Rest. Drink adequate fluids.  Suction as need. Use albuterol treatments as need.   Take antibiotic (doxycycline) as prescribed.  Take prednisone as prescribed.  Follow up with primary care doctor in the coming week.  Return to ER if worse, increased trouble breathing, chest pain, other concern.

## 2017-02-13 NOTE — ED Triage Notes (Signed)
Pt trach pt. Pt has been having wheezing with congestion for one week. Pt has been suctioning and flushing about 6 times a day. Pt has been able to remove phlegm.

## 2017-02-13 NOTE — ED Notes (Signed)
Patient transported to X-ray 

## 2017-02-22 IMAGING — DX DG CHEST 2V
2 series · 2 of 2 positions shown · non-contrast
Comparison: 04/11/2015

CLINICAL DATA: Cough, shortness of breath congestion and sneezing
for 2 days.

EXAM:
CHEST  2 VIEW

[chest pa]
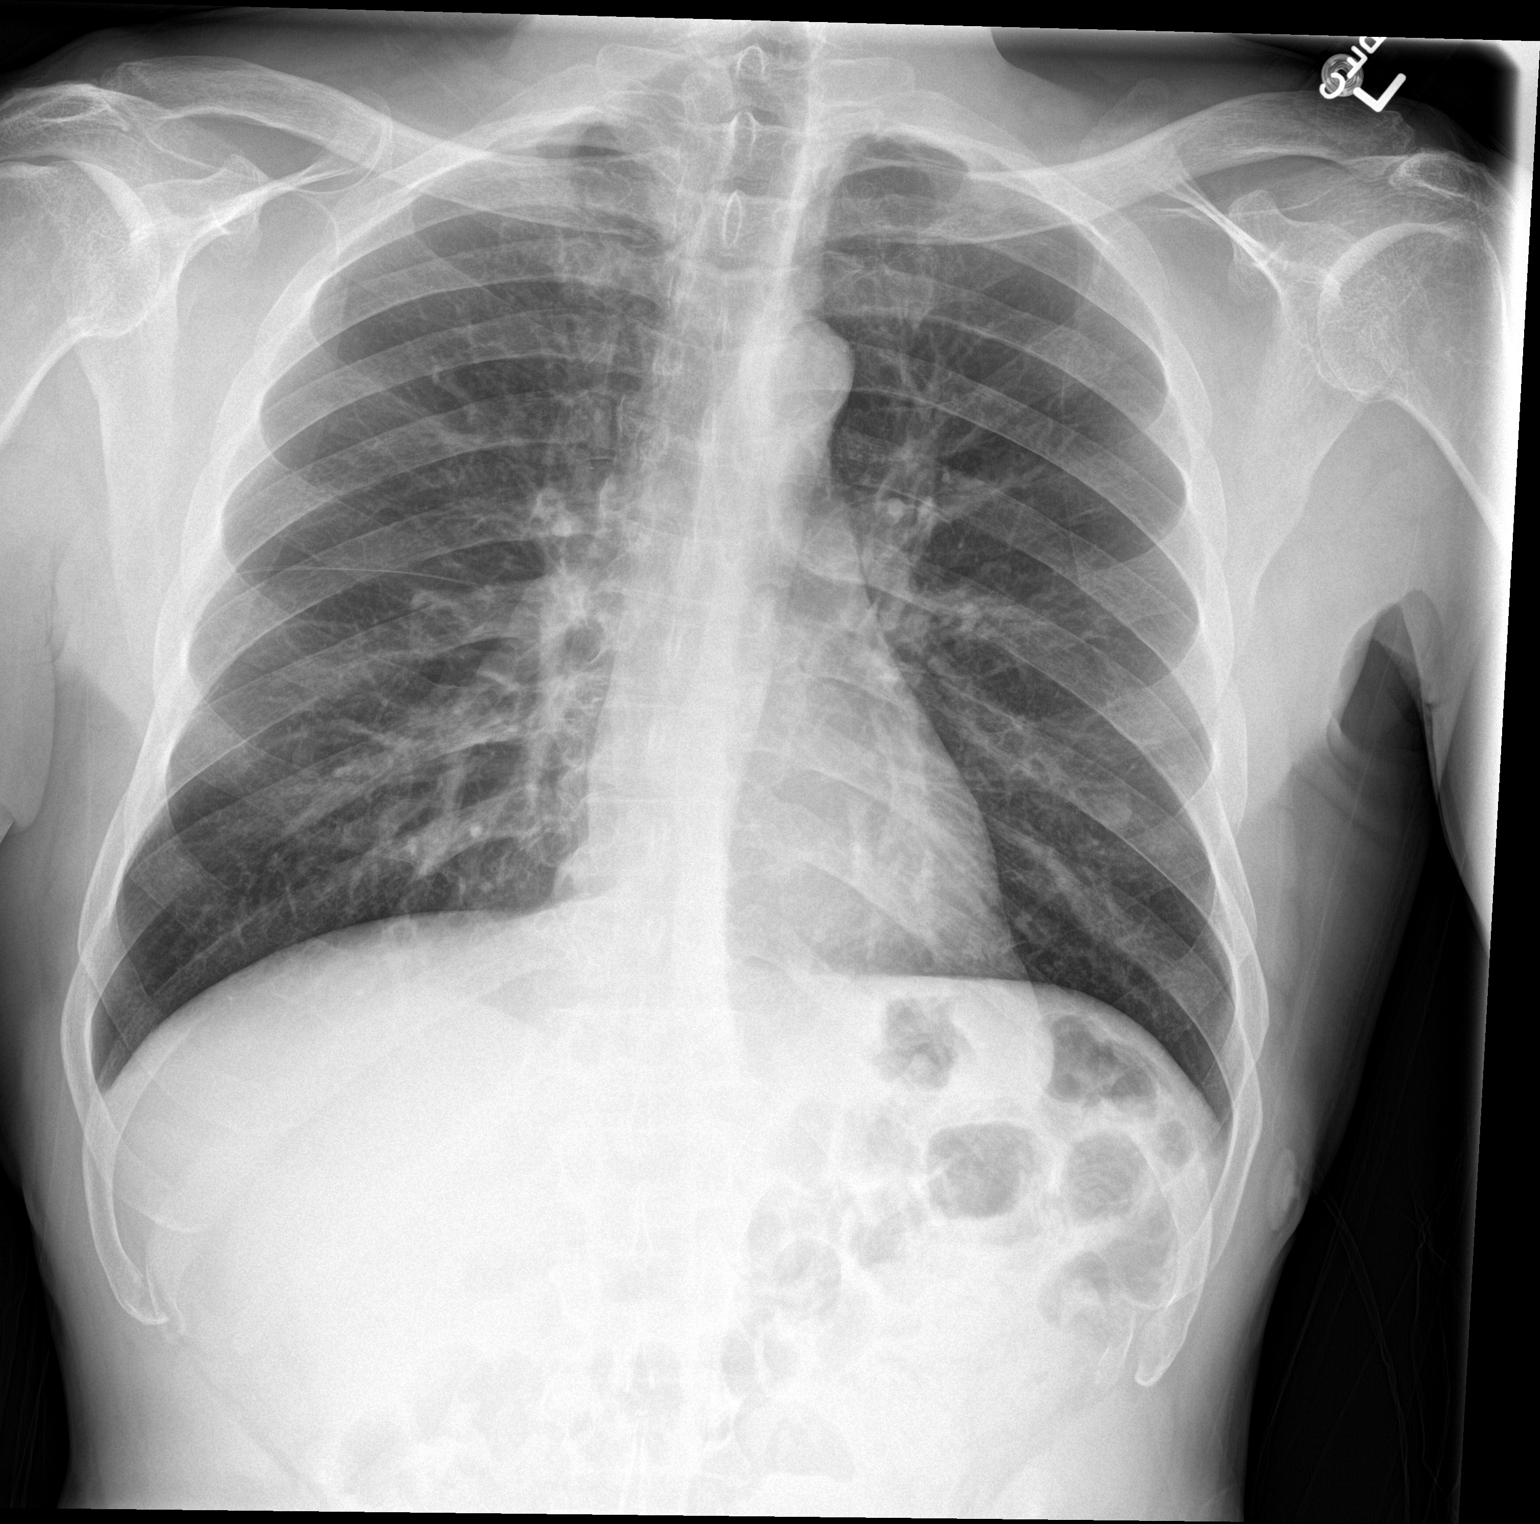

[chest lat]
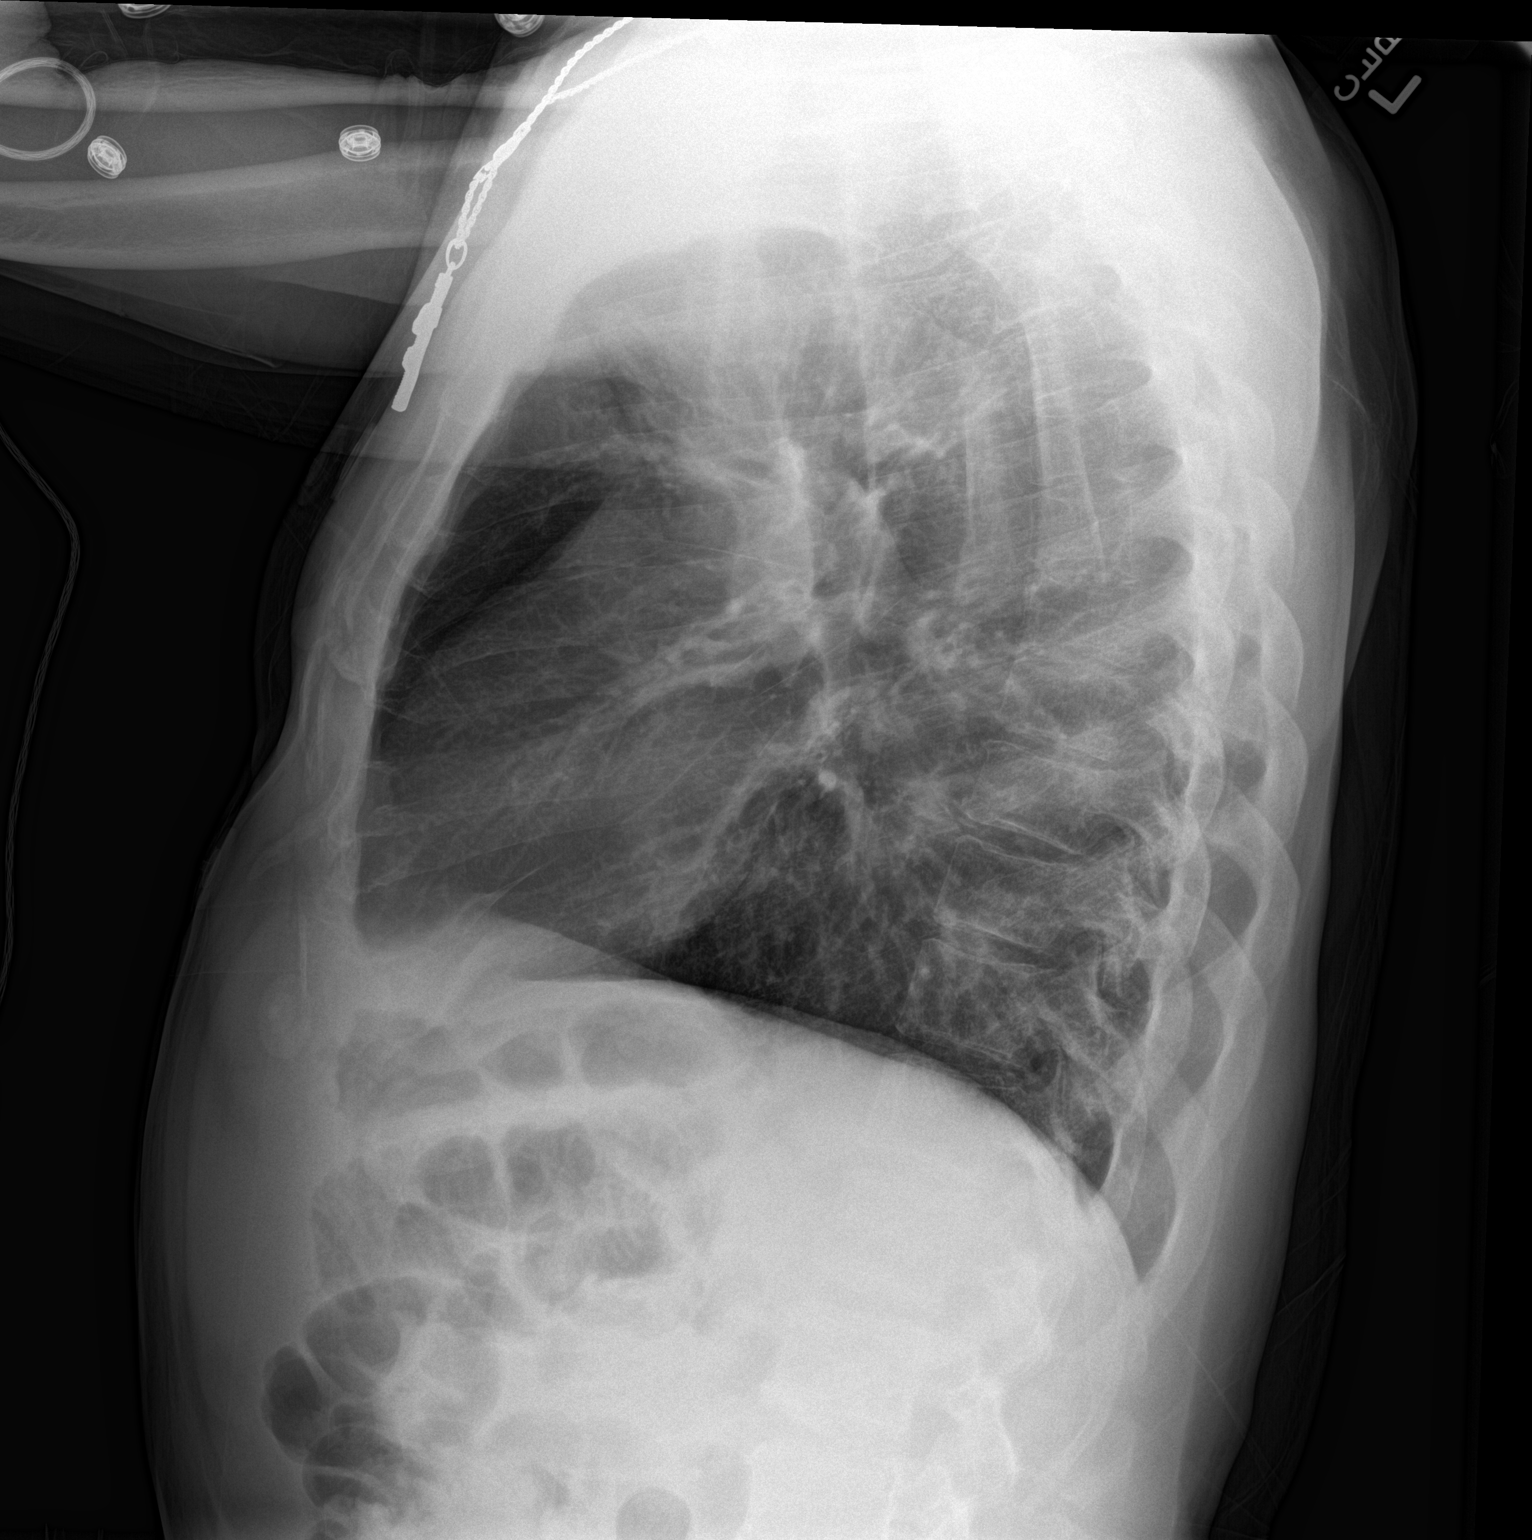

[2 of 2 positions shown; findings below may reference images not displayed]

FINDINGS: Chronic hyperinflation and bronchial thickening. The
cardiomediastinal contours are normal. Pulmonary vasculature is
normal. No consolidation, pleural effusion, or pneumothorax. No
acute osseous abnormalities are seen.
IMPRESSION: Chronic hyperinflation bronchial thickening. No acute pulmonary
process.

## 2017-02-23 IMAGING — DX DG CHEST 2V
2 series · 2 of 2 positions shown · non-contrast
Comparison: 05/11/2015

CLINICAL DATA: Short of breath for 5 months. History COPD/asthma.
Hypertension.

EXAM:
CHEST  2 VIEW

[chest pa]
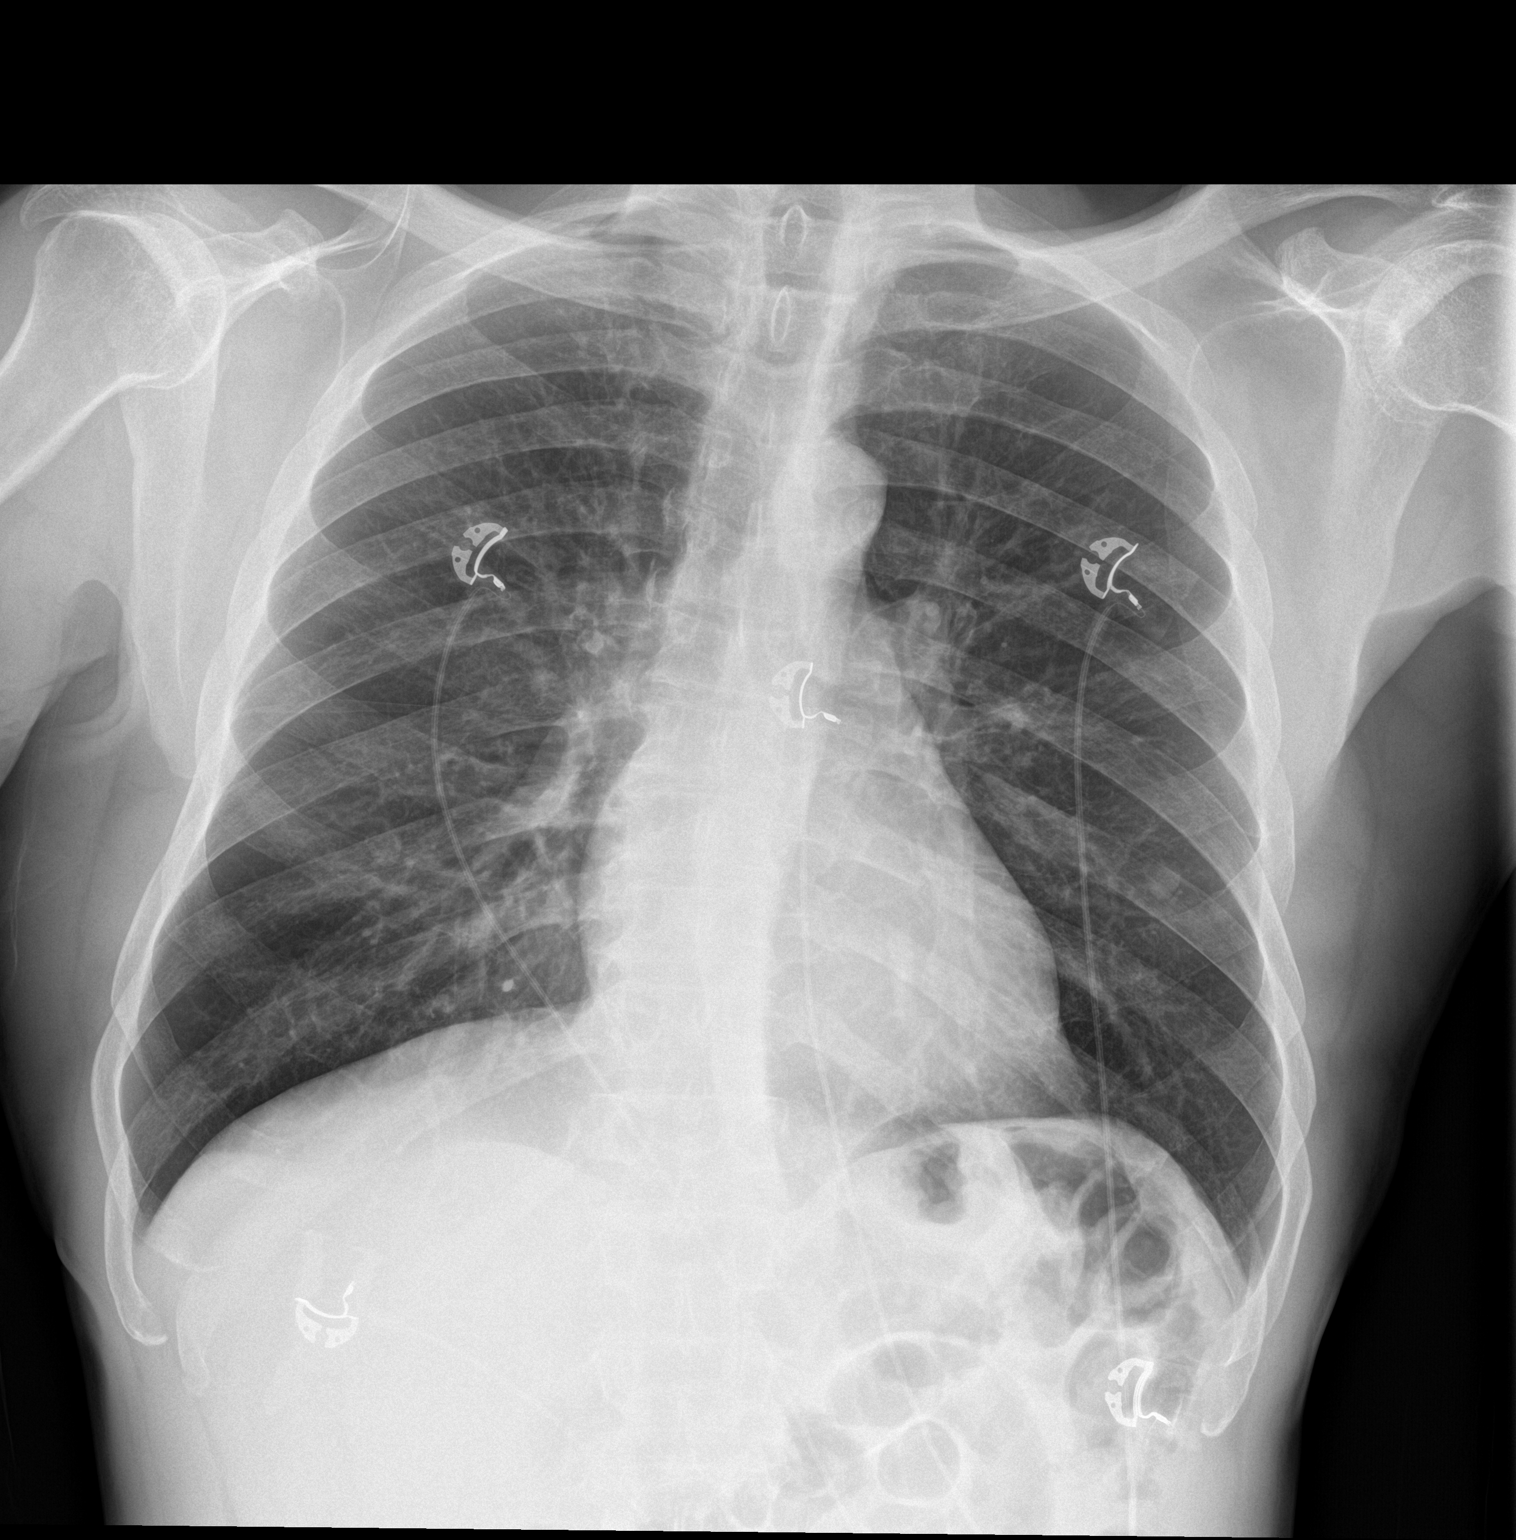

[chest lat]
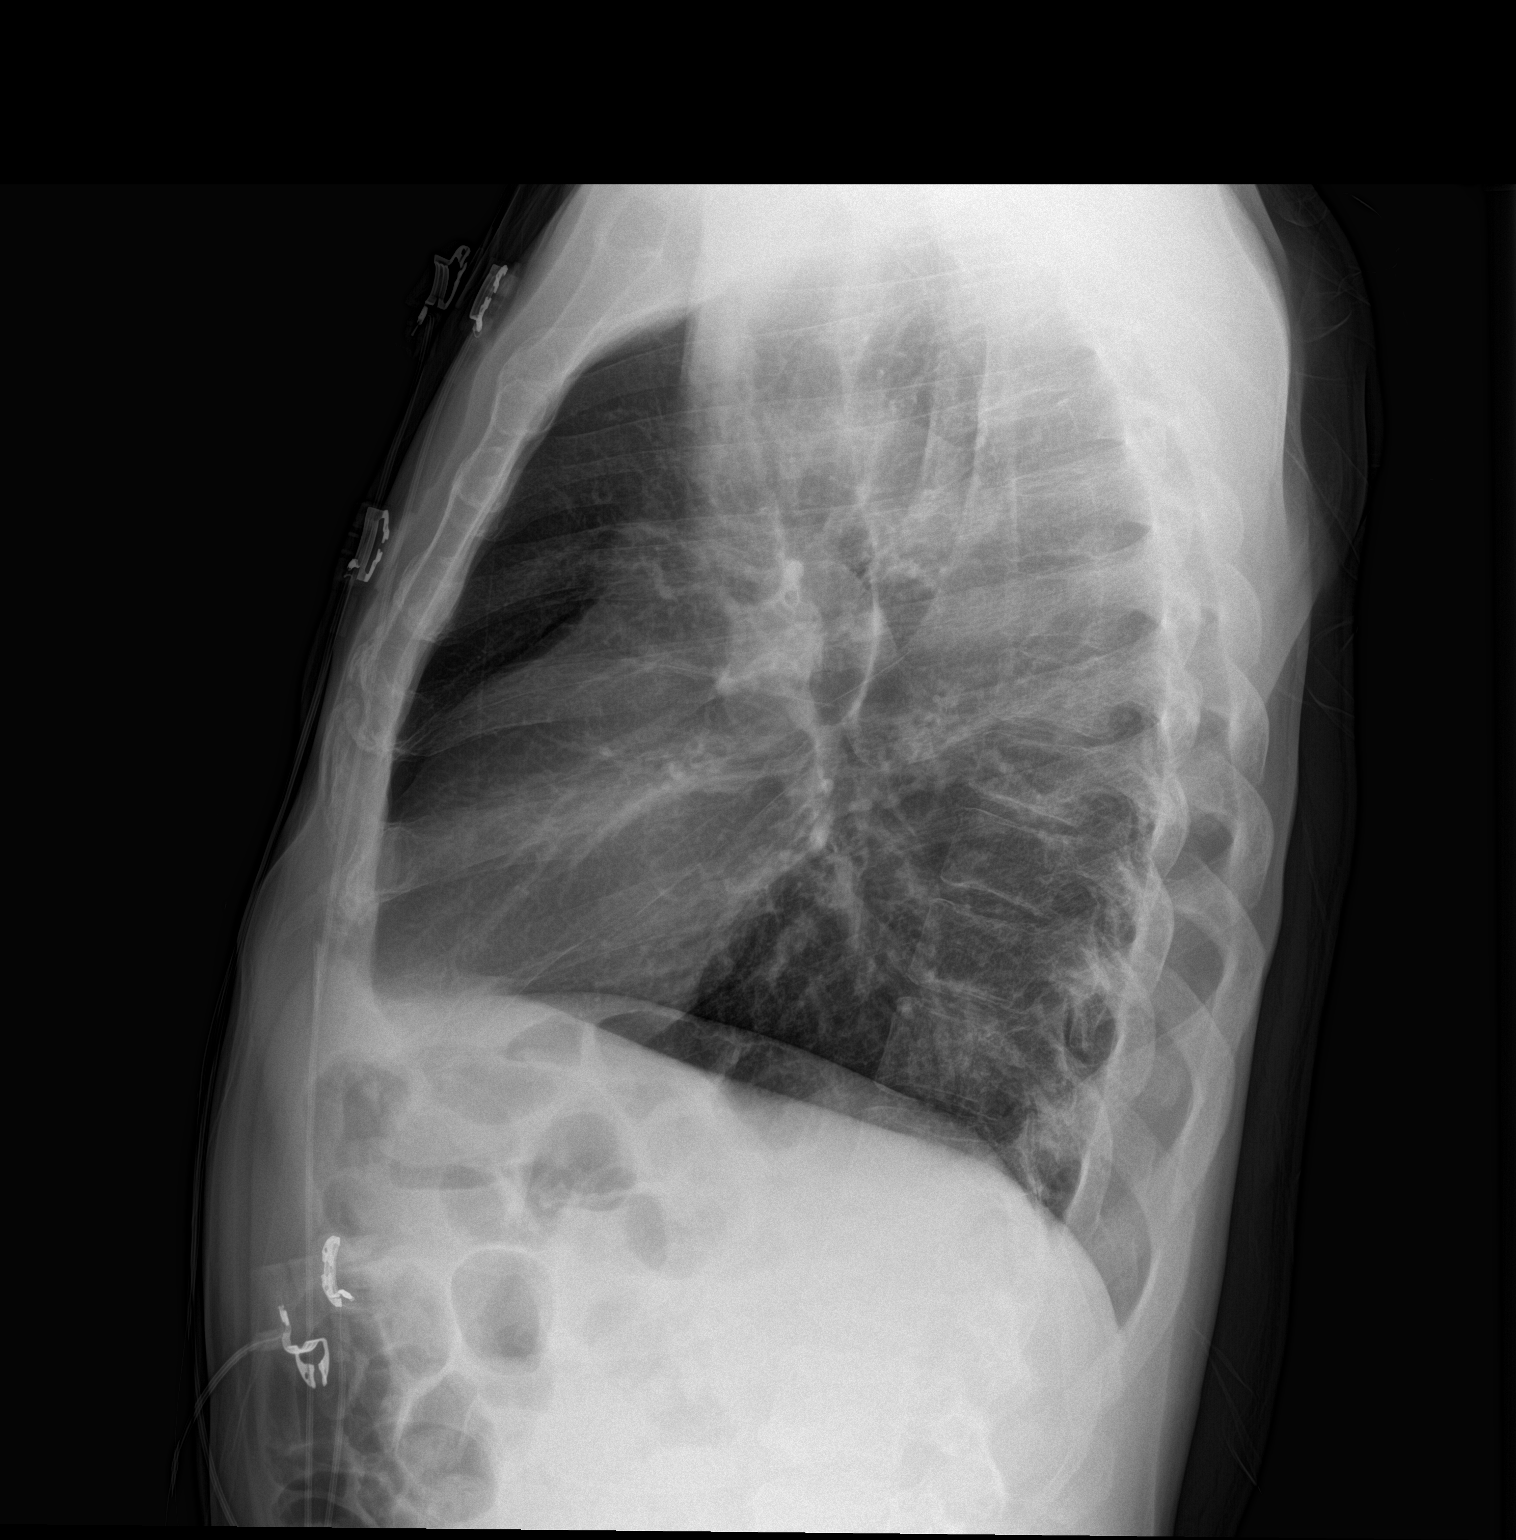

[2 of 2 positions shown; findings below may reference images not displayed]

FINDINGS: Midline trachea. Normal heart size. Atherosclerosis in the
transverse aorta. No pleural effusion or pneumothorax. Mild
hyperinflation and central airway thickening. Nodular density
projecting over the left mid lung on the frontal radiograph is
likely nipple shadow; no pulmonary nodule in this area on the CT of
03/17/2005.

No lobar consolidation.
IMPRESSION: Hyperinflation and mild central airway thickening, suggesting COPD.

No acute cardiopulmonary disease.

## 2017-04-06 ENCOUNTER — Encounter: Payer: Self-pay | Admitting: Adult Health

## 2017-04-06 ENCOUNTER — Ambulatory Visit (INDEPENDENT_AMBULATORY_CARE_PROVIDER_SITE_OTHER): Payer: Medicare Other | Admitting: Internal Medicine

## 2017-04-06 ENCOUNTER — Ambulatory Visit (INDEPENDENT_AMBULATORY_CARE_PROVIDER_SITE_OTHER): Payer: Medicare Other | Admitting: Adult Health

## 2017-04-06 ENCOUNTER — Ambulatory Visit (INDEPENDENT_AMBULATORY_CARE_PROVIDER_SITE_OTHER)
Admission: RE | Admit: 2017-04-06 | Discharge: 2017-04-06 | Disposition: A | Discharge status: Home / Self Care | Payer: Medicare Other | Source: Ambulatory Visit | Attending: Adult Health | Admitting: Adult Health

## 2017-04-06 VITALS — BP 124/70 | HR 79 | Ht 66.0 in | Wt 156.0 lb

## 2017-04-06 DIAGNOSIS — J3802 Paralysis of vocal cords and larynx, bilateral: Secondary | ICD-10-CM | POA: Diagnosis not present

## 2017-04-06 DIAGNOSIS — J4530 Mild persistent asthma, uncomplicated: Secondary | ICD-10-CM

## 2017-04-06 DIAGNOSIS — R0602 Shortness of breath: Secondary | ICD-10-CM | POA: Diagnosis not present

## 2017-04-06 LAB — PULMONARY FUNCTION TEST
FEF 25-75 PRE: 0.34 L/s
FEF2575-%Pred-Pre: 14 %
FEV1-%Pred-Pre: 34 %
FEV1-Pre: 0.89 L
FEV1FVC-%Pred-Pre: 64 %
FEV6-%Pred-Pre: 52 %
FEV6-Pre: 1.71 L
FEV6FVC-%PRED-PRE: 100 %
FVC-%PRED-PRE: 52 %
FVC-Pre: 1.79 L
Pre FEV1/FVC ratio: 50 %
Pre FEV6/FVC Ratio: 95 %

## 2017-04-06 MED ORDER — PREDNISONE 10 MG PO TABS: ORAL_TABLET | ORAL | 0 refills | Status: DC

## 2017-04-06 MED ORDER — METHYLPREDNISOLONE ACETATE 80 MG/ML IJ SUSP
80.0000 mg | Freq: Once | INTRAMUSCULAR | Status: AC
  Administered 2017-04-06: 80 mg via INTRAMUSCULAR

## 2017-04-06 MED ORDER — LEVALBUTEROL HCL 0.63 MG/3ML IN NEBU
0.6300 mg | INHALATION_SOLUTION | Freq: Once | RESPIRATORY_TRACT | Status: AC
  Administered 2017-04-06: 0.63 mg via RESPIRATORY_TRACT

## 2017-04-06 NOTE — Addendum Note (Signed)
Addended by: Parke Poisson E on: 04/06/2017 05:11 PM   Modules accepted: Orders

## 2017-04-06 NOTE — Assessment & Plan Note (Addendum)
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 Pt w/ recent decannulation . He is no distress but with return of upper airway stridor may need trach replaced. Called Dr. Hazle Coca office in [facility] and w/  recommendations for ER as trach may be needed.  Dr. Melvyn Novas  In with pt and  PFT in office shows airflow obstruction w/ out significant UAO .  Will treat as asthma flare and trigger prevention  Refer to Dr. Rowe Clack for follow up ov -routine  If symptoms worse with tracheal issues will need to try to go to [facility] ER if possible or local ER if severe  Steroid taper, Depo medrol 80mg  IM x 1  xopenex neb in office .    Plan  Patient Instructions  Refer to ENT at [facility] Dr. Rowe Clack for appointment this week.  Please contact office for sooner follow up if symptoms do not improve or worsen or seek emergency care  Prednisone taper over next week  Continue on Zyrtec 10mg  daily  Continue on Protonix daily. 30 minutes before meal Continue on Pepcid 20mg  At bedtime   Continue on Symbicort 80 2 puffs twice daily, rinse after use. May use Delsym 2 teaspoons twice daily as needed for cough. follow up Dr. Melvyn Novas  In 6 weeks and As needed   Please contact office for sooner follow up if symptoms do not improve or worsen or seek emergency care

## 2017-04-06 NOTE — Patient Instructions (Addendum)
Refer to ENT at [facility] Dr. Rowe Clack for appointment in next  week.  Please contact office for sooner follow up if symptoms do not improve or worsen or seek emergency care  Prednisone taper over next week  Continue on Zyrtec 10mg  daily  Continue on Protonix daily. 30 minutes before meal Continue on Pepcid 20mg  At bedtime   Continue on Symbicort 80 2 puffs twice daily, rinse after use. May use Delsym 2 teaspoons twice daily as needed for cough. follow up Dr. Melvyn Novas  In 4  weeks and As needed   Please contact office for sooner follow up if symptoms do not improve or worsen or seek emergency care
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
## 2017-04-06 NOTE — Assessment & Plan Note (Signed)
S/p Trach and decannulation  Mild stridor on exam w/ no evidence of resp distress  Will begin steroids and refer to ENT  Please contact office for sooner follow up if symptoms do not improve or worsen or seek emergency care

## 2017-04-06 NOTE — Progress Notes (Signed)
Pre BD spiro completed today. 04/06/17

## 2017-04-06 NOTE — Addendum Note (Signed)
Addended by: Parke Poisson E on: 04/06/2017 05:16 PM   Modules accepted: Orders

## 2017-04-15 ENCOUNTER — Ambulatory Visit: Payer: Self-pay | Admitting: Adult Health

## 2017-04-15 DIAGNOSIS — R413 Other amnesia: Secondary | ICD-10-CM | POA: Insufficient documentation

## 2017-04-19 DIAGNOSIS — R430 Anosmia: Secondary | ICD-10-CM | POA: Insufficient documentation

## 2017-05-01 IMAGING — CR DG CHEST 2V
2 series · 2 of 2 positions shown · non-contrast
Comparison: 05/12/2015

CLINICAL DATA: Dyspnea all week, worsened this morning.

EXAM:
CHEST  2 VIEW

[w chest pa]
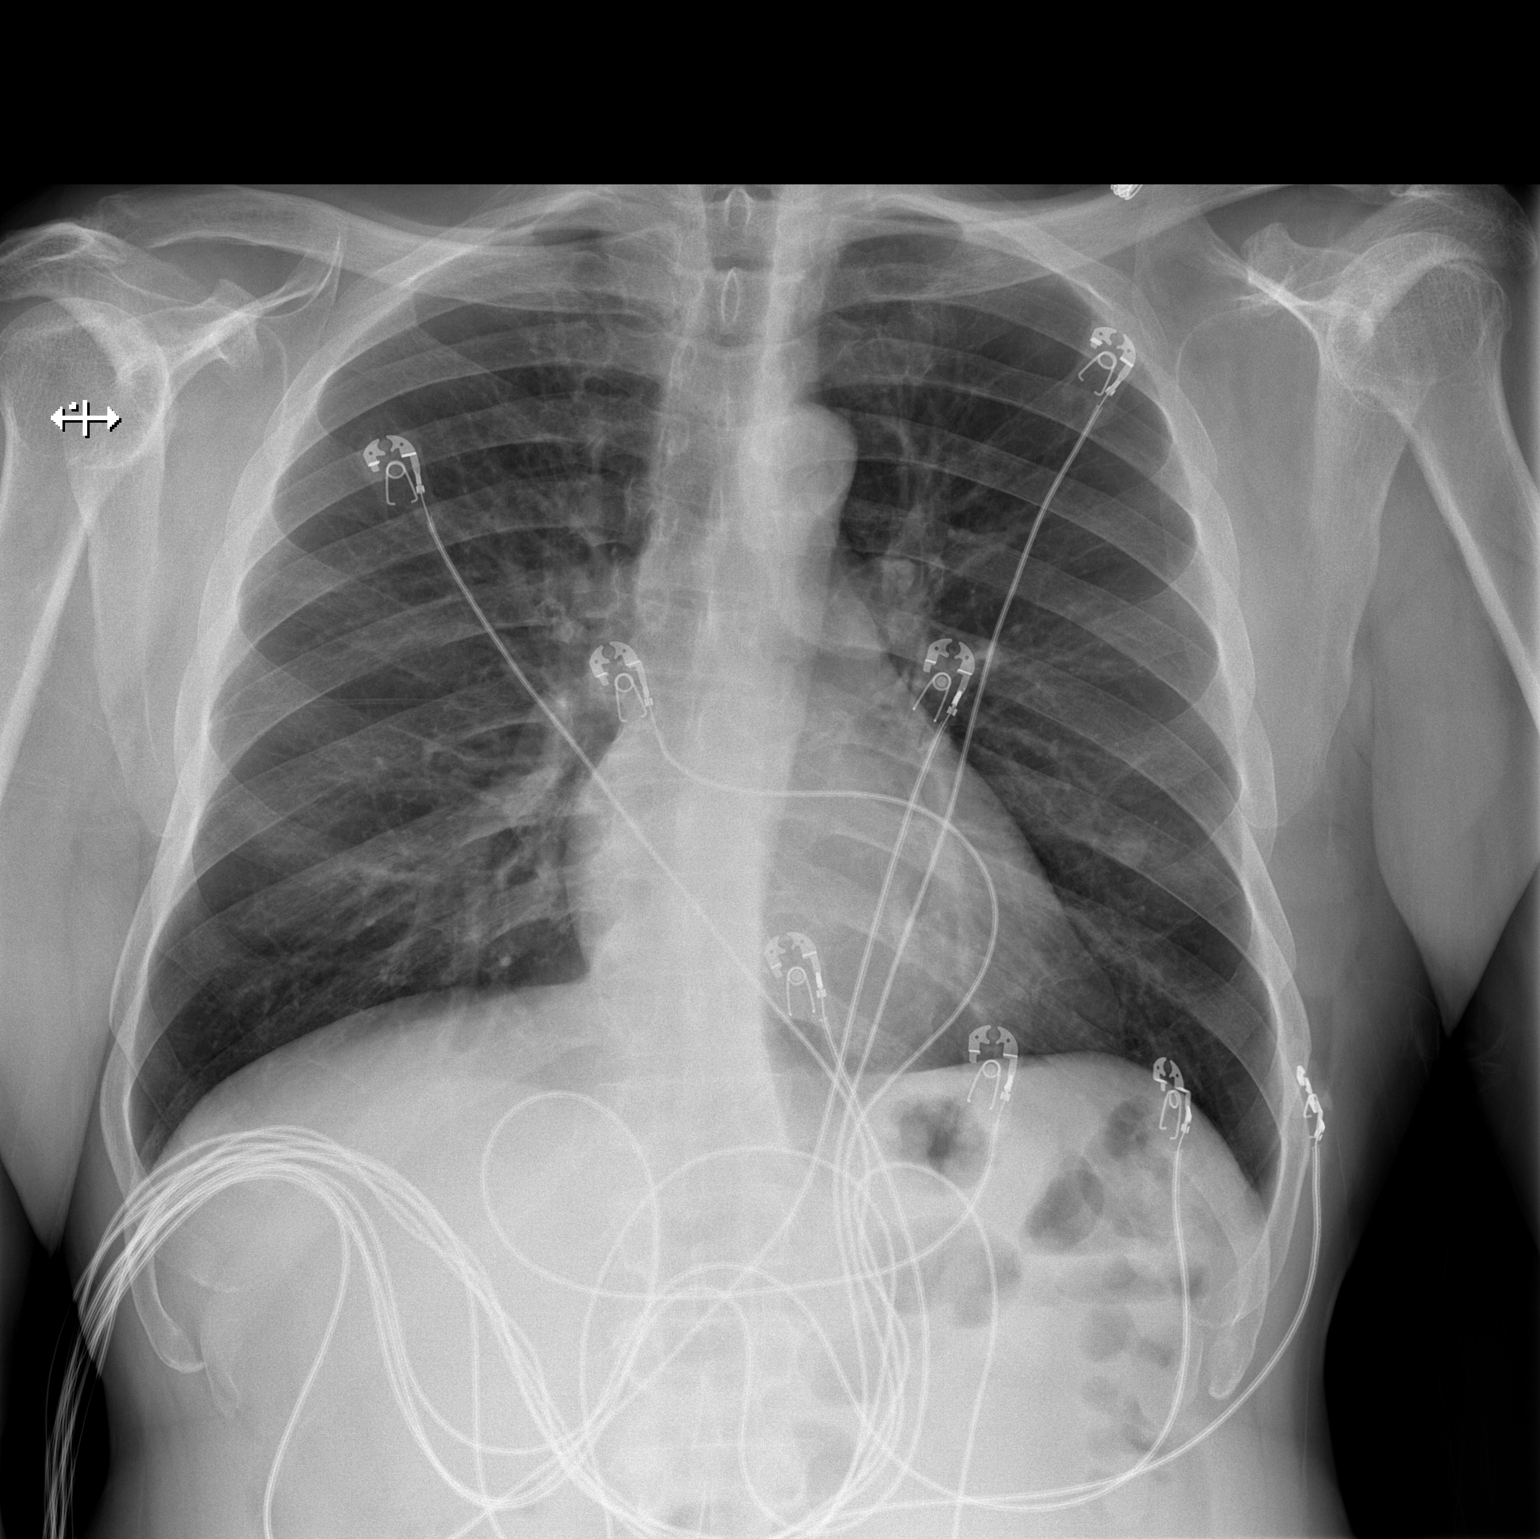

[w chest lat]
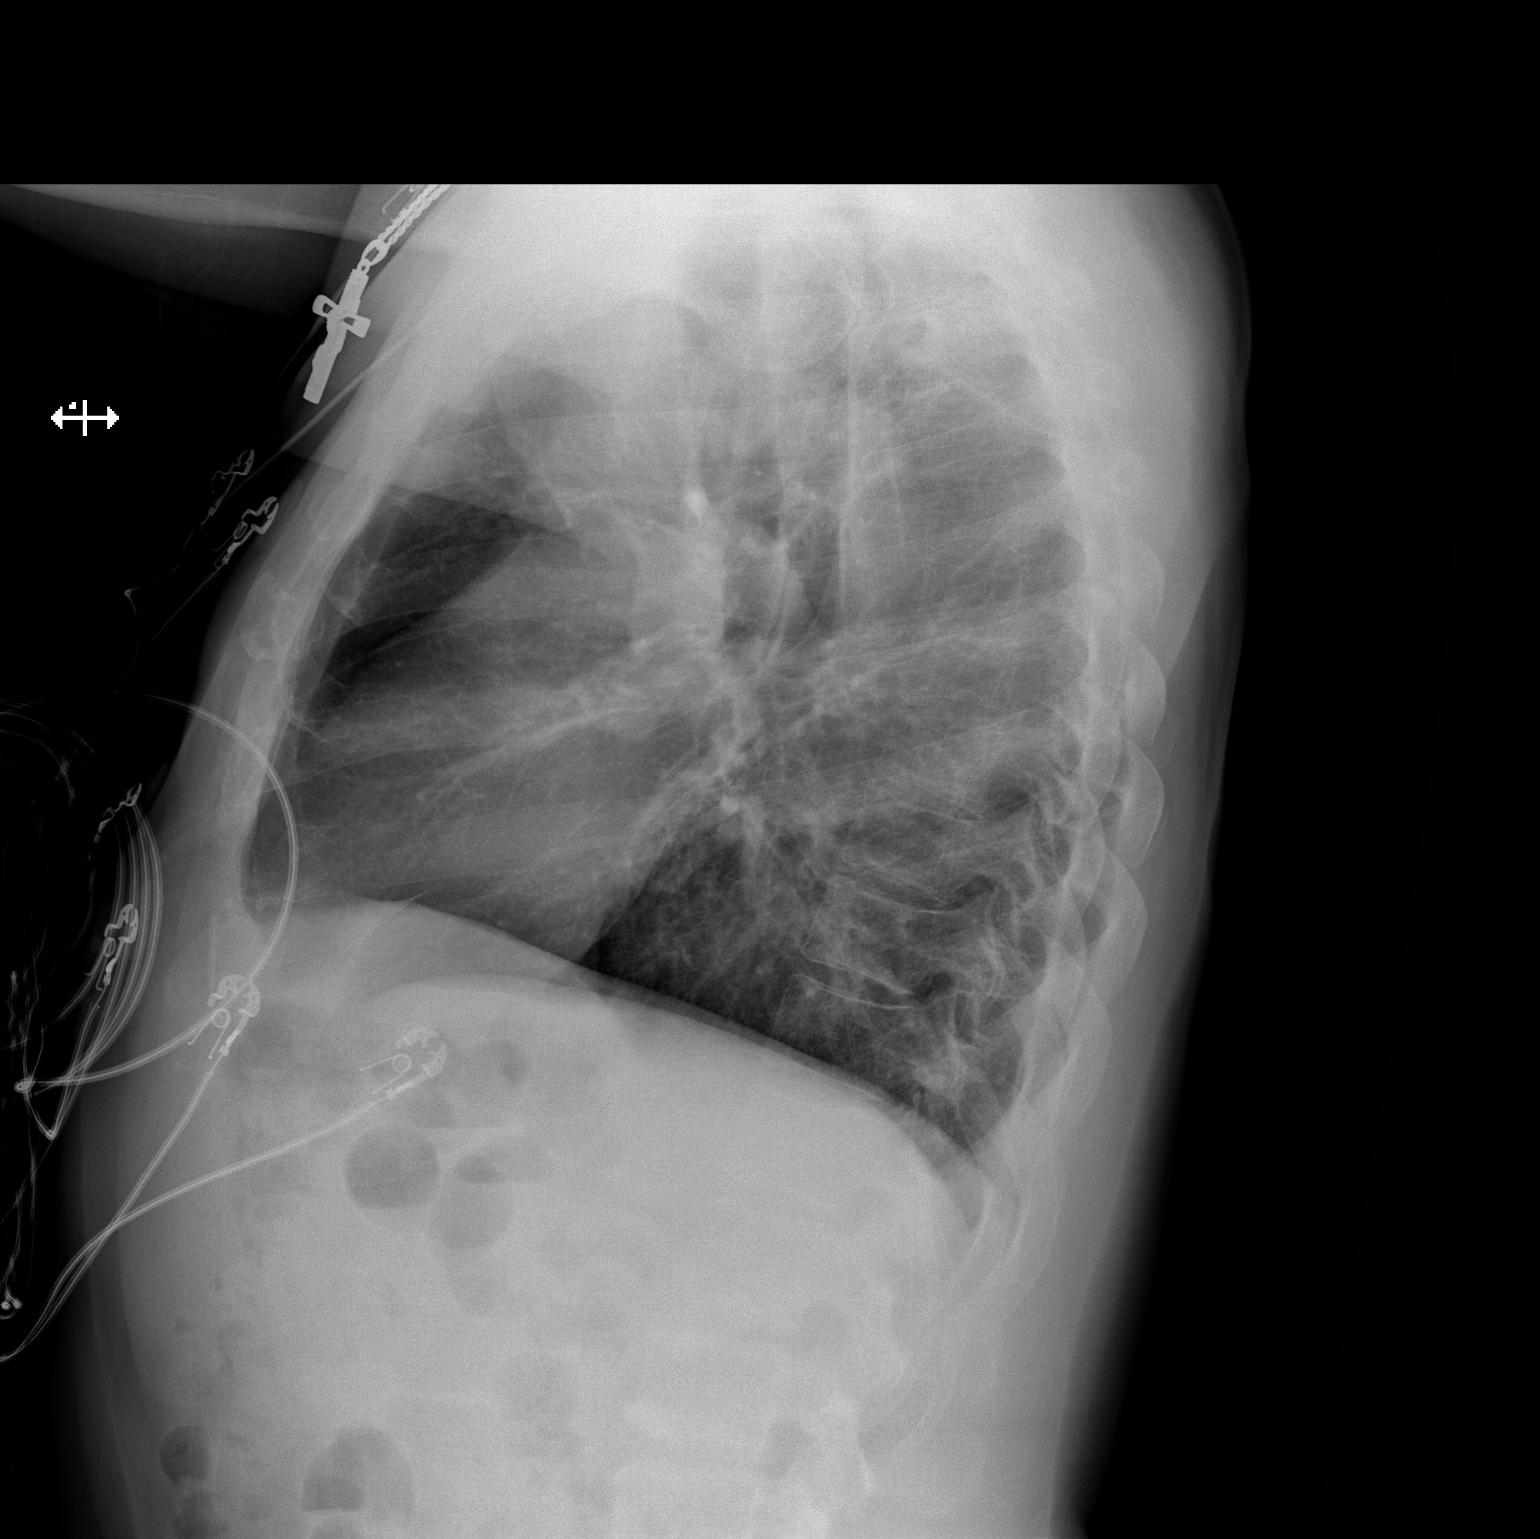

[2 of 2 positions shown; findings below may reference images not displayed]

FINDINGS: Nipple shadow again noted on the left. The lungs are clear. There is
no pleural effusion. Hilar and mediastinal contours are
unremarkable. Heart size is normal. Pulmonary vasculature is normal.
IMPRESSION: No active cardiopulmonary disease.

## 2017-05-02 ENCOUNTER — Other Ambulatory Visit: Payer: Self-pay | Admitting: Internal Medicine

## 2017-05-18 IMAGING — CR DG CHEST 1V PORT
1 series · 1 of 1 positions shown · non-contrast
Comparison: Chest radiograph from earlier today.

CLINICAL DATA: Shortness of breath. Questionable nipple shadow in
the right lower lung on chest radiograph from earlier today.

EXAM:
PORTABLE CHEST 1 VIEW

[AP]
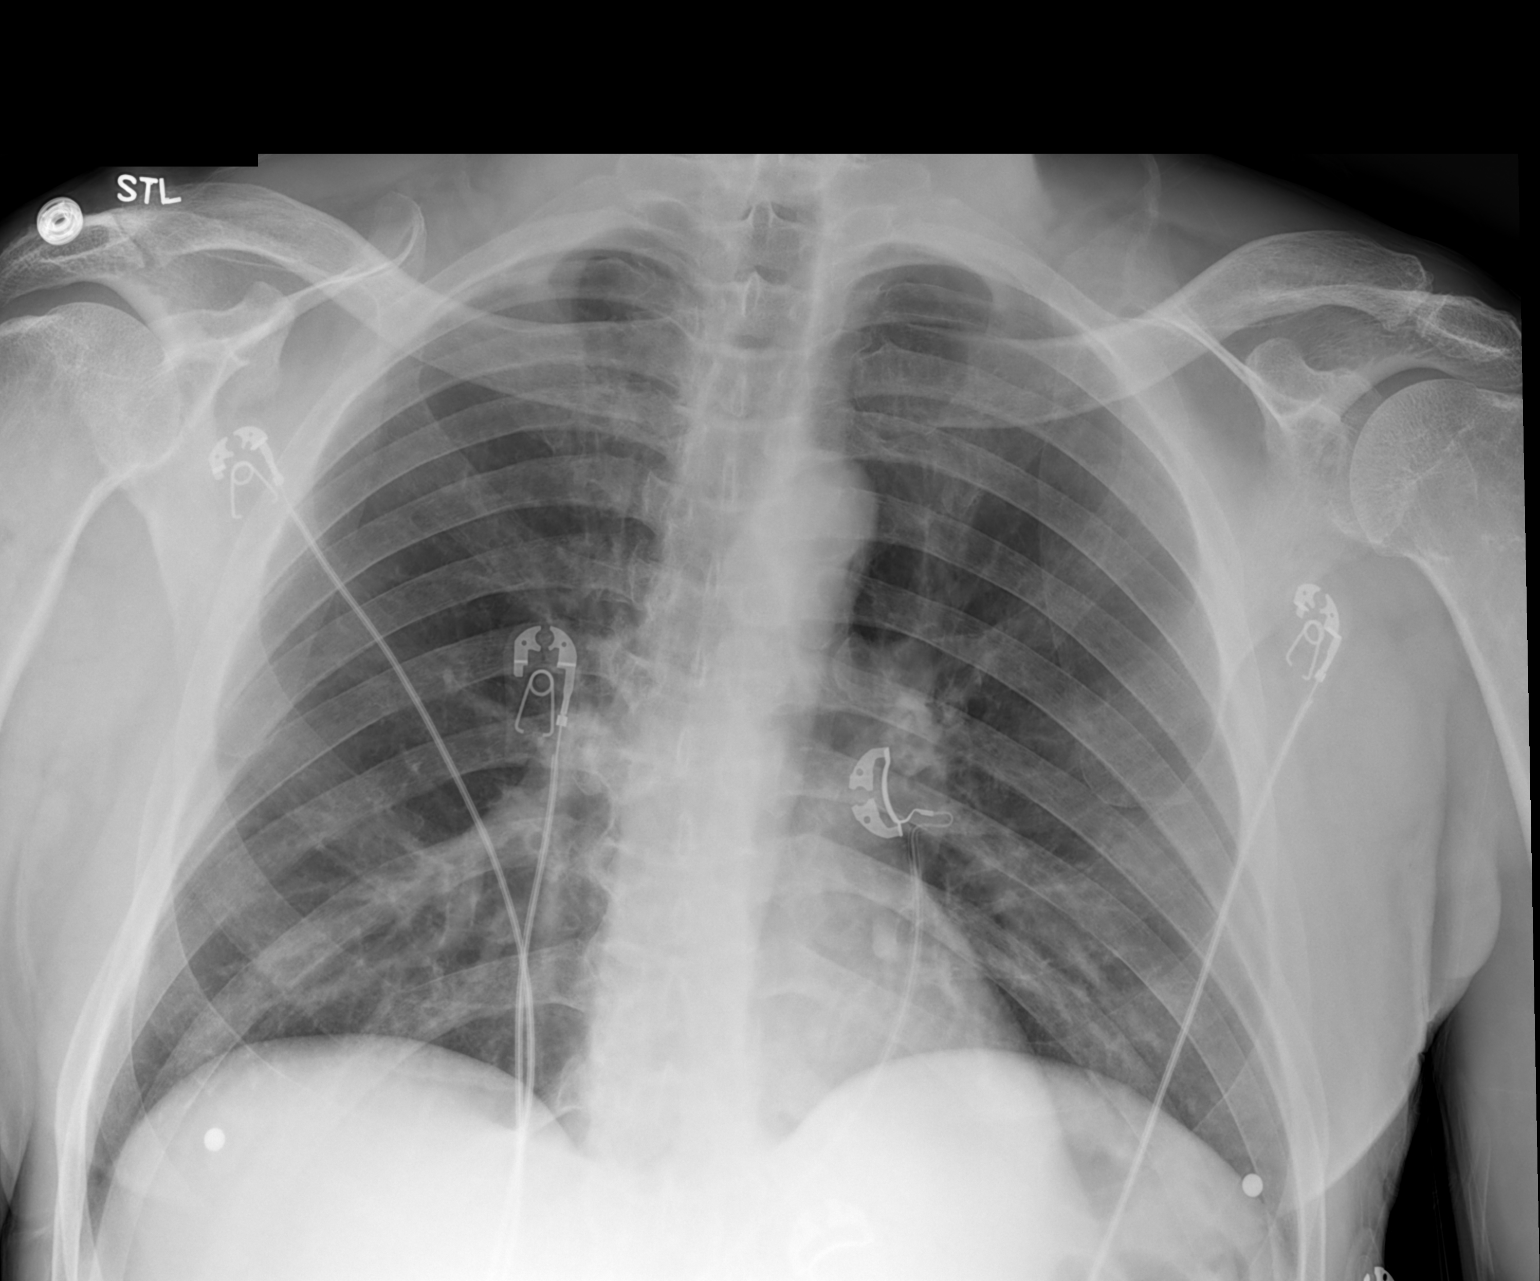

[1 of 1 positions shown; findings below may reference images not displayed]

FINDINGS: Nipple markers were placed on the skin bilaterally by the
technologist. Stable cardiomediastinal silhouette with normal heart
size. No pneumothorax. No pleural effusion. Clear lungs, with no
focal lung consolidation and no pulmonary edema. The nodular opacity
described in the right lower lung on the chest radiograph from
earlier today correlates with the right nipple.
IMPRESSION: No active disease. Right nipple shadow confirmed with nipple marker.

## 2017-05-18 IMAGING — CR DG CHEST 1V PORT
1 series · 1 of 1 positions shown · non-contrast
Comparison: 07/18/2015

CLINICAL DATA: PT C/O SEVERE SUDDEN ONSET SOB, FEVER, COUGH,
CONGESTION X 2 DAYS, HX COPD

EXAM:
PORTABLE CHEST 1 VIEW

[AP]
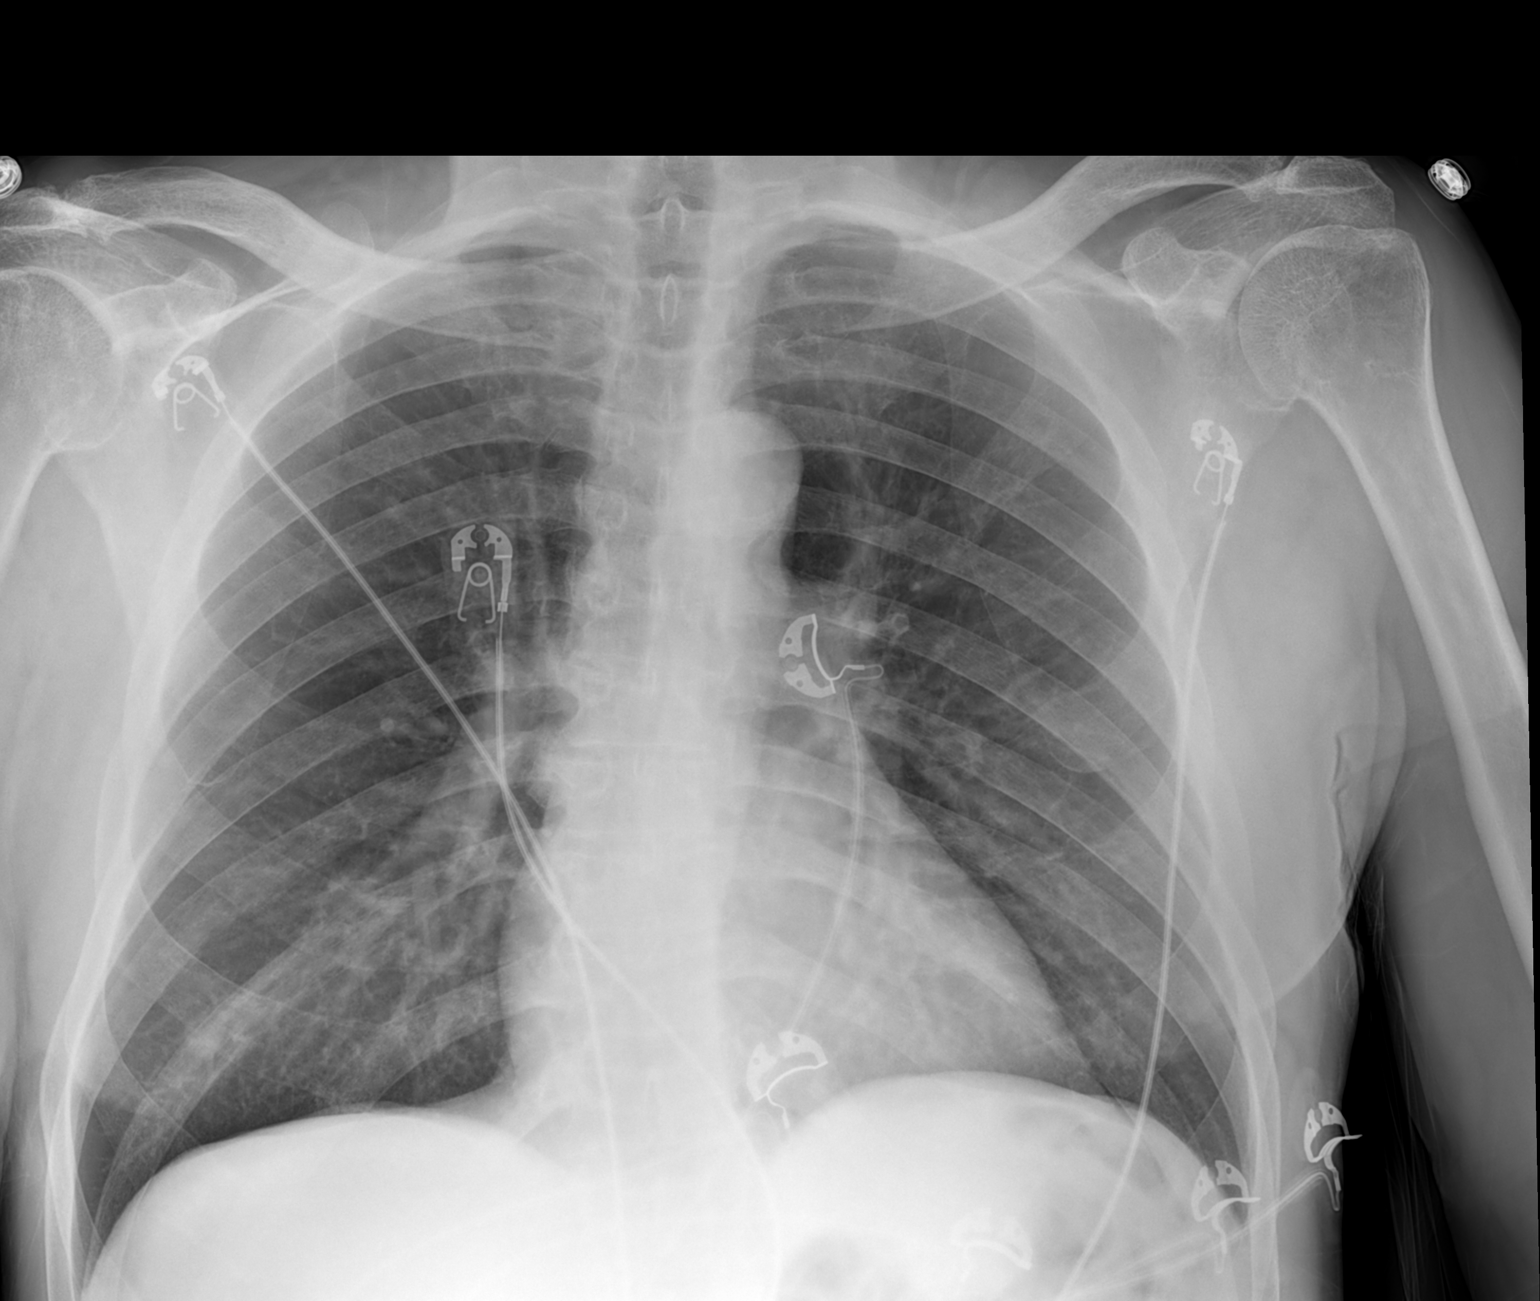

[1 of 1 positions shown; findings below may reference images not displayed]

FINDINGS: Numerous leads and wires project over the chest. Midline trachea.
Normal heart size. No pleural effusion or pneumothorax. Nodular
density projecting over the right lung base could represent a nipple
shadow and/or osseous summation shadow. No nodule in this area on CT
of 03/18/2015.
IMPRESSION: No acute cardiopulmonary disease.

Possible right sided nipple shadow. Repeat frontal radiograph with
nipple markers could confirm.

## 2017-05-22 ENCOUNTER — Emergency Department (HOSPITAL_COMMUNITY): Payer: Medicare Other

## 2017-05-22 ENCOUNTER — Observation Stay (HOSPITAL_COMMUNITY)
Admission: EM | Admit: 2017-05-22 | Discharge: 2017-05-24 | Disposition: A | Discharge status: Home / Self Care | Payer: Medicare Other | Attending: Orthopedic Surgery | Admitting: Orthopedic Surgery

## 2017-05-22 DIAGNOSIS — R55 Syncope and collapse: Secondary | ICD-10-CM | POA: Diagnosis not present

## 2017-05-22 DIAGNOSIS — S82842A Displaced bimalleolar fracture of left lower leg, initial encounter for closed fracture: Secondary | ICD-10-CM | POA: Diagnosis not present

## 2017-05-22 DIAGNOSIS — M79601 Pain in right arm: Secondary | ICD-10-CM | POA: Diagnosis present

## 2017-05-22 DIAGNOSIS — I10 Essential (primary) hypertension: Secondary | ICD-10-CM | POA: Insufficient documentation

## 2017-05-22 DIAGNOSIS — S72434A Nondisplaced fracture of medial condyle of right femur, initial encounter for closed fracture: Secondary | ICD-10-CM

## 2017-05-22 DIAGNOSIS — K219 Gastro-esophageal reflux disease without esophagitis: Secondary | ICD-10-CM | POA: Insufficient documentation

## 2017-05-22 DIAGNOSIS — S72431A Displaced fracture of medial condyle of right femur, initial encounter for closed fracture: Secondary | ICD-10-CM | POA: Diagnosis not present

## 2017-05-22 DIAGNOSIS — S51811A Laceration without foreign body of right forearm, initial encounter: Secondary | ICD-10-CM | POA: Insufficient documentation

## 2017-05-22 DIAGNOSIS — J449 Chronic obstructive pulmonary disease, unspecified: Secondary | ICD-10-CM | POA: Insufficient documentation

## 2017-05-22 DIAGNOSIS — T07XXXA Unspecified multiple injuries, initial encounter: Secondary | ICD-10-CM

## 2017-05-22 DIAGNOSIS — S82122B Displaced fracture of lateral condyle of left tibia, initial encounter for open fracture type I or II: Secondary | ICD-10-CM | POA: Diagnosis present

## 2017-05-22 DIAGNOSIS — S82892A Other fracture of left lower leg, initial encounter for closed fracture: Secondary | ICD-10-CM

## 2017-05-22 DIAGNOSIS — S82141B Displaced bicondylar fracture of right tibia, initial encounter for open fracture type I or II: Secondary | ICD-10-CM | POA: Diagnosis not present

## 2017-05-22 MED ORDER — CEFAZOLIN SODIUM-DEXTROSE 2-4 GM/100ML-% IV SOLN
2.0000 g | Freq: Once | INTRAVENOUS | Status: AC
  Administered 2017-05-22: 2 g via INTRAVENOUS
  Filled 2017-05-22: qty 100

## 2017-05-22 MED ORDER — ONDANSETRON HCL 4 MG/2ML IJ SOLN
4.0000 mg | Freq: Once | INTRAMUSCULAR | Status: AC
  Administered 2017-05-22: 4 mg via INTRAVENOUS
  Filled 2017-05-22: qty 2

## 2017-05-22 MED ORDER — OXYCODONE-ACETAMINOPHEN 5-325 MG PO TABS
1.0000 | ORAL_TABLET | Freq: Once | ORAL | Status: AC
  Administered 2017-05-22: 1 via ORAL
  Filled 2017-05-22: qty 1

## 2017-05-22 MED ORDER — MORPHINE SULFATE (PF) 4 MG/ML IV SOLN
4.0000 mg | Freq: Once | INTRAVENOUS | Status: AC
  Administered 2017-05-22: 4 mg via INTRAVENOUS
  Filled 2017-05-22: qty 1
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
## 2017-05-23 ENCOUNTER — Encounter (HOSPITAL_COMMUNITY): Payer: Self-pay | Admitting: Certified Registered"

## 2017-05-23 ENCOUNTER — Emergency Department (HOSPITAL_COMMUNITY): Payer: Medicare Other | Admitting: Certified Registered"

## 2017-05-23 ENCOUNTER — Encounter (HOSPITAL_COMMUNITY)
Admission: EM | Disposition: A | Discharge status: Home / Self Care | Payer: Self-pay | Source: Home / Self Care | Attending: Emergency Medicine

## 2017-05-23 DIAGNOSIS — S82141B Displaced bicondylar fracture of right tibia, initial encounter for open fracture type I or II: Secondary | ICD-10-CM | POA: Diagnosis not present

## 2017-05-23 DIAGNOSIS — S82122B Displaced fracture of lateral condyle of left tibia, initial encounter for open fracture type I or II: Secondary | ICD-10-CM

## 2017-05-23 DIAGNOSIS — S82842A Displaced bimalleolar fracture of left lower leg, initial encounter for closed fracture: Secondary | ICD-10-CM | POA: Diagnosis not present

## 2017-05-23 DIAGNOSIS — S51811A Laceration without foreign body of right forearm, initial encounter: Secondary | ICD-10-CM | POA: Diagnosis not present

## 2017-05-23 DIAGNOSIS — S72431A Displaced fracture of medial condyle of right femur, initial encounter for closed fracture: Secondary | ICD-10-CM | POA: Diagnosis not present

## 2017-05-23 HISTORY — PX: IRRIGATION AND DEBRIDEMENT KNEE: SHX5185

## 2017-05-23 HISTORY — DX: Displaced fracture of lateral condyle of left tibia, initial encounter for open fracture type I or II: S82.122B

## 2017-05-23 LAB — CBC WITH DIFFERENTIAL/PLATELET
BASOS ABS: 0 10*3/uL (ref 0.0–0.1)
BASOS PCT: 0 %
EOS ABS: 0.2 10*3/uL (ref 0.0–0.7)
EOS PCT: 3 %
HCT: 40.9 % (ref 39.0–52.0)
Hemoglobin: 14.1 g/dL (ref 13.0–17.0)
Lymphocytes Relative: 37 %
Lymphs Abs: 2.9 10*3/uL (ref 0.7–4.0)
MCH: 31.1 pg (ref 26.0–34.0)
MCHC: 34.5 g/dL (ref 30.0–36.0)
MCV: 90.1 fL (ref 78.0–100.0)
MONO ABS: 0.5 10*3/uL (ref 0.1–1.0)
Monocytes Relative: 6 %
NEUTROS ABS: 4.1 10*3/uL (ref 1.7–7.7)
Neutrophils Relative %: 54 %
PLATELETS: 237 10*3/uL (ref 150–400)
RBC: 4.54 MIL/uL (ref 4.22–5.81)
RDW: 14.4 % (ref 11.5–15.5)
WBC: 7.6 10*3/uL (ref 4.0–10.5)

## 2017-05-23 LAB — COMPREHENSIVE METABOLIC PANEL
ALBUMIN: 3.7 g/dL (ref 3.5–5.0)
ALK PHOS: 30 U/L — AB (ref 38–126)
ALT: 31 U/L (ref 17–63)
ANION GAP: 14 (ref 5–15)
AST: 31 U/L (ref 15–41)
BILIRUBIN TOTAL: 0.4 mg/dL (ref 0.3–1.2)
BUN: 15 mg/dL (ref 6–20)
CALCIUM: 8.7 mg/dL — AB (ref 8.9–10.3)
CO2: 20 mmol/L — ABNORMAL LOW (ref 22–32)
CREATININE: 0.95 mg/dL (ref 0.61–1.24)
Chloride: 104 mmol/L (ref 101–111)
GFR calc Af Amer: >60 mL/min (ref >60)
GFR calc non Af Amer: >60 mL/min (ref >60)
GLUCOSE: 110 mg/dL — AB (ref 65–99)
Potassium: 3.9 mmol/L (ref 3.5–5.1)
Sodium: 138 mmol/L (ref 135–145)
TOTAL PROTEIN: 7.1 g/dL (ref 6.5–8.1)

## 2017-05-23 LAB — LIPASE, BLOOD: LIPASE: 17 U/L (ref 11–51)

## 2017-05-23 SURGERY — IRRIGATION AND DEBRIDEMENT KNEE
Anesthesia: General | Laterality: Right

## 2017-05-23 MED ORDER — PROPOFOL 10 MG/ML IV BOLUS
INTRAVENOUS | Status: AC
  Filled 2017-05-23: qty 20

## 2017-05-23 MED ORDER — ACETAMINOPHEN 650 MG RE SUPP: 650.0000 mg | Freq: Four times a day (QID) | RECTAL | Status: DC | PRN

## 2017-05-23 MED ORDER — SODIUM CHLORIDE 0.9 % IR SOLN
Status: DC | PRN
  Administered 2017-05-23: 1000 mL
  Administered 2017-05-23 (×2): 3000 mL

## 2017-05-23 MED ORDER — MIDAZOLAM HCL 2 MG/2ML IJ SOLN
INTRAMUSCULAR | Status: AC
Start: 2017-05-23 — End: 2017-05-23
  Filled 2017-05-23: qty 2

## 2017-05-23 MED ORDER — MIDAZOLAM HCL 5 MG/5ML IJ SOLN
INTRAMUSCULAR | Status: DC | PRN
  Administered 2017-05-23: 2 mg via INTRAVENOUS

## 2017-05-23 MED ORDER — HYDROMORPHONE HCL-NACL 0.5-0.9 MG/ML-% IV SOSY
1.0000 mg | PREFILLED_SYRINGE | INTRAVENOUS | Status: DC | PRN
  Administered 2017-05-23 – 2017-05-24 (×4): 1 mg via INTRAVENOUS
  Filled 2017-05-23 (×4): qty 2

## 2017-05-23 MED ORDER — ACETAMINOPHEN 325 MG PO TABS
650.0000 mg | ORAL_TABLET | Freq: Four times a day (QID) | ORAL | Status: DC | PRN
  Administered 2017-05-24: 650 mg via ORAL
  Filled 2017-05-23: qty 2

## 2017-05-23 MED ORDER — MOMETASONE FURO-FORMOTEROL FUM 100-5 MCG/ACT IN AERO
2.0000 | INHALATION_SPRAY | Freq: Two times a day (BID) | RESPIRATORY_TRACT | Status: DC
  Administered 2017-05-23 – 2017-05-24 (×3): 2 via RESPIRATORY_TRACT
  Filled 2017-05-23: qty 8.8

## 2017-05-23 MED ORDER — ONDANSETRON HCL 4 MG/2ML IJ SOLN: 4.0000 mg | Freq: Four times a day (QID) | INTRAMUSCULAR | Status: DC | PRN

## 2017-05-23 MED ORDER — PHENYLEPHRINE 40 MCG/ML (10ML) SYRINGE FOR IV PUSH (FOR BLOOD PRESSURE SUPPORT)
PREFILLED_SYRINGE | INTRAVENOUS | Status: AC
  Filled 2017-05-23: qty 10

## 2017-05-23 MED ORDER — ALBUTEROL SULFATE (2.5 MG/3ML) 0.083% IN NEBU
2.5000 mg | INHALATION_SOLUTION | RESPIRATORY_TRACT | Status: DC | PRN
  Administered 2017-05-23: 2.5 mg via RESPIRATORY_TRACT
  Filled 2017-05-23: qty 3

## 2017-05-23 MED ORDER — PROMETHAZINE HCL 25 MG/ML IJ SOLN: 6.2500 mg | INTRAMUSCULAR | Status: DC | PRN

## 2017-05-23 MED ORDER — FINASTERIDE 5 MG PO TABS
5.0000 mg | ORAL_TABLET | Freq: Two times a day (BID) | ORAL | Status: DC
  Administered 2017-05-23 – 2017-05-24 (×3): 5 mg via ORAL
  Filled 2017-05-23 (×3): qty 1

## 2017-05-23 MED ORDER — OXYCODONE HCL 5 MG PO TABS: 5.0000 mg | ORAL_TABLET | ORAL | 0 refills | Status: DC | PRN

## 2017-05-23 MED ORDER — MONTELUKAST SODIUM 10 MG PO TABS
10.0000 mg | ORAL_TABLET | Freq: Every day | ORAL | Status: DC
  Administered 2017-05-23: 10 mg via ORAL
  Filled 2017-05-23: qty 1

## 2017-05-23 MED ORDER — KETOROLAC TROMETHAMINE 15 MG/ML IJ SOLN
7.5000 mg | Freq: Four times a day (QID) | INTRAMUSCULAR | Status: AC
  Administered 2017-05-23 – 2017-05-24 (×4): 7.5 mg via INTRAVENOUS
  Filled 2017-05-23 (×4): qty 1

## 2017-05-23 MED ORDER — OXYCODONE HCL 5 MG PO TABS
5.0000 mg | ORAL_TABLET | ORAL | Status: DC | PRN
  Administered 2017-05-23 (×2): 5 mg via ORAL
  Administered 2017-05-24 (×2): 10 mg via ORAL
  Filled 2017-05-23: qty 2
  Filled 2017-05-23 (×2): qty 1
  Filled 2017-05-23 (×2): qty 2

## 2017-05-23 MED ORDER — LIDOCAINE 2% (20 MG/ML) 5 ML SYRINGE
INTRAMUSCULAR | Status: AC
  Filled 2017-05-23: qty 5

## 2017-05-23 MED ORDER — FENTANYL CITRATE (PF) 100 MCG/2ML IJ SOLN
INTRAMUSCULAR | Status: AC
  Filled 2017-05-23: qty 2

## 2017-05-23 MED ORDER — DOCUSATE SODIUM 100 MG PO CAPS
100.0000 mg | ORAL_CAPSULE | Freq: Two times a day (BID) | ORAL | Status: DC
  Administered 2017-05-23 – 2017-05-24 (×3): 100 mg via ORAL
  Filled 2017-05-23 (×3): qty 1

## 2017-05-23 MED ORDER — LACTATED RINGERS IV SOLN
INTRAVENOUS | Status: DC
  Administered 2017-05-23: 03:00:00 via INTRAVENOUS

## 2017-05-23 MED ORDER — ADULT MULTIVITAMIN W/MINERALS CH
1.0000 | ORAL_TABLET | Freq: Every day | ORAL | Status: DC
  Administered 2017-05-23 – 2017-05-24 (×2): 1 via ORAL
  Filled 2017-05-23 (×2): qty 1

## 2017-05-23 MED ORDER — METOCLOPRAMIDE HCL 5 MG PO TABS: 5.0000 mg | ORAL_TABLET | Freq: Three times a day (TID) | ORAL | Status: DC | PRN

## 2017-05-23 MED ORDER — ONDANSETRON HCL 4 MG/2ML IJ SOLN
INTRAMUSCULAR | Status: DC | PRN
  Administered 2017-05-23: 4 mg via INTRAVENOUS

## 2017-05-23 MED ORDER — LACTATED RINGERS IV SOLN
INTRAVENOUS | Status: DC | PRN
  Administered 2017-05-23: 01:00:00 via INTRAVENOUS

## 2017-05-23 MED ORDER — PROPOFOL 10 MG/ML IV BOLUS
INTRAVENOUS | Status: DC | PRN
  Administered 2017-05-23: 160 mg via INTRAVENOUS

## 2017-05-23 MED ORDER — IRBESARTAN 75 MG PO TABS
75.0000 mg | ORAL_TABLET | Freq: Every day | ORAL | Status: DC
  Administered 2017-05-23 – 2017-05-24 (×2): 75 mg via ORAL
  Filled 2017-05-23 (×2): qty 1

## 2017-05-23 MED ORDER — VALSARTAN-HYDROCHLOROTHIAZIDE 80-12.5 MG PO TABS: 1.0000 | ORAL_TABLET | Freq: Every day | ORAL | Status: DC

## 2017-05-23 MED ORDER — PREDNISONE 5 MG PO TABS
10.0000 mg | ORAL_TABLET | Freq: Every day | ORAL | Status: AC
  Administered 2017-05-23: 10 mg via ORAL
  Filled 2017-05-23: qty 2

## 2017-05-23 MED ORDER — LORATADINE 10 MG PO TABS
10.0000 mg | ORAL_TABLET | Freq: Every day | ORAL | Status: DC
  Administered 2017-05-23 – 2017-05-24 (×2): 10 mg via ORAL
  Filled 2017-05-23 (×2): qty 1

## 2017-05-23 MED ORDER — HYDROCHLOROTHIAZIDE 12.5 MG PO CAPS
12.5000 mg | ORAL_CAPSULE | Freq: Every day | ORAL | Status: DC
  Administered 2017-05-23 – 2017-05-24 (×2): 12.5 mg via ORAL
  Filled 2017-05-23 (×2): qty 1

## 2017-05-23 MED ORDER — ENOXAPARIN SODIUM 40 MG/0.4ML ~~LOC~~ SOLN
40.0000 mg | SUBCUTANEOUS | Status: DC
  Administered 2017-05-24: 40 mg via SUBCUTANEOUS
  Filled 2017-05-23: qty 0.4

## 2017-05-23 MED ORDER — METOCLOPRAMIDE HCL 5 MG/ML IJ SOLN: 5.0000 mg | Freq: Three times a day (TID) | INTRAMUSCULAR | Status: DC | PRN

## 2017-05-23 MED ORDER — BUPIVACAINE-EPINEPHRINE (PF) 0.5% -1:200000 IJ SOLN
INTRAMUSCULAR | Status: AC
  Filled 2017-05-23: qty 30

## 2017-05-23 MED ORDER — MEPERIDINE HCL 50 MG/ML IJ SOLN: 6.2500 mg | INTRAMUSCULAR | Status: DC | PRN

## 2017-05-23 MED ORDER — CEFAZOLIN SODIUM-DEXTROSE 1-4 GM/50ML-% IV SOLN
1.0000 g | Freq: Four times a day (QID) | INTRAVENOUS | Status: AC
  Administered 2017-05-23 (×3): 1 g via INTRAVENOUS
  Filled 2017-05-23 (×3): qty 50

## 2017-05-23 MED ORDER — ONDANSETRON HCL 4 MG PO TABS: 4.0000 mg | ORAL_TABLET | Freq: Four times a day (QID) | ORAL | Status: DC | PRN

## 2017-05-23 MED ORDER — CEFAZOLIN SODIUM-DEXTROSE 2-4 GM/100ML-% IV SOLN
INTRAVENOUS | Status: AC
  Filled 2017-05-23: qty 100

## 2017-05-23 MED ORDER — FENTANYL CITRATE (PF) 100 MCG/2ML IJ SOLN
25.0000 ug | INTRAMUSCULAR | Status: DC | PRN
  Administered 2017-05-23: 25 ug via INTRAVENOUS

## 2017-05-23 MED ORDER — GUAIFENESIN ER 600 MG PO TB12
600.0000 mg | ORAL_TABLET | Freq: Two times a day (BID) | ORAL | Status: DC
  Administered 2017-05-23 – 2017-05-24 (×3): 600 mg via ORAL
  Filled 2017-05-23 (×3): qty 1

## 2017-05-23 MED ORDER — LIDOCAINE 2% (20 MG/ML) 5 ML SYRINGE
INTRAMUSCULAR | Status: DC | PRN
  Administered 2017-05-23: 100 mg via INTRAVENOUS

## 2017-05-23 MED ORDER — FENTANYL CITRATE (PF) 100 MCG/2ML IJ SOLN
INTRAMUSCULAR | Status: DC | PRN
  Administered 2017-05-23 (×2): 50 ug via INTRAVENOUS

## 2017-05-23 MED ORDER — LACTATED RINGERS IV SOLN: INTRAVENOUS | Status: DC

## 2017-05-23 MED ORDER — KCL IN DEXTROSE-NACL 20-5-0.45 MEQ/L-%-% IV SOLN
INTRAVENOUS | Status: DC
  Administered 2017-05-23 (×2): via INTRAVENOUS
  Filled 2017-05-23 (×3): qty 1000

## 2017-05-23 MED ORDER — CEFAZOLIN SODIUM-DEXTROSE 2-4 GM/100ML-% IV SOLN
2.0000 g | INTRAVENOUS | Status: AC
  Administered 2017-05-23: 2 g via INTRAVENOUS

## 2017-05-23 MED ORDER — FAMOTIDINE 20 MG PO TABS
20.0000 mg | ORAL_TABLET | Freq: Every day | ORAL | Status: DC
  Administered 2017-05-23: 21:00:00 20 mg via ORAL
  Filled 2017-05-23: qty 1

## 2017-05-23 MED ORDER — ONDANSETRON HCL 4 MG/2ML IJ SOLN
INTRAMUSCULAR | Status: AC
  Filled 2017-05-23: qty 2

## 2017-05-23 MED ORDER — IPRATROPIUM-ALBUTEROL 0.5-2.5 (3) MG/3ML IN SOLN
3.0000 mL | Freq: Two times a day (BID) | RESPIRATORY_TRACT | Status: DC
  Administered 2017-05-24: 3 mL via RESPIRATORY_TRACT
  Filled 2017-05-23: qty 3

## 2017-05-23 MED ORDER — SENNA 8.6 MG PO TABS
1.0000 | ORAL_TABLET | Freq: Two times a day (BID) | ORAL | Status: DC
  Administered 2017-05-23 – 2017-05-24 (×3): 8.6 mg via ORAL
  Filled 2017-05-23 (×3): qty 1

## 2017-05-23 MED ORDER — PHENYLEPHRINE 40 MCG/ML (10ML) SYRINGE FOR IV PUSH (FOR BLOOD PRESSURE SUPPORT)
PREFILLED_SYRINGE | INTRAVENOUS | Status: DC | PRN
  Administered 2017-05-23 (×4): 40 ug via INTRAVENOUS

## 2017-05-23 MED ORDER — SUCCINYLCHOLINE CHLORIDE 200 MG/10ML IV SOSY
PREFILLED_SYRINGE | INTRAVENOUS | Status: DC | PRN
  Administered 2017-05-23: 120 mg via INTRAVENOUS

## 2017-05-23 SURGICAL SUPPLY — 65 items
BAG SPEC THK2 15X12 ZIP CLS (MISCELLANEOUS) ×1
BAG ZIPLOCK 12X15 (MISCELLANEOUS) ×3 IMPLANT
BANDAGE ACE 4X5 VEL STRL LF (GAUZE/BANDAGES/DRESSINGS) ×2 IMPLANT
BANDAGE ACE 6X5 VEL STRL LF (GAUZE/BANDAGES/DRESSINGS) ×3 IMPLANT
BANDAGE ESMARK 6X9 LF (GAUZE/BANDAGES/DRESSINGS) IMPLANT
BNDG CMPR 9X4 STRL LF SNTH (GAUZE/BANDAGES/DRESSINGS) ×1
BNDG CMPR 9X6 STRL LF SNTH (GAUZE/BANDAGES/DRESSINGS) ×1
BNDG CONFORM 3 STRL LF (GAUZE/BANDAGES/DRESSINGS) ×3 IMPLANT
BNDG ESMARK 4X9 LF (GAUZE/BANDAGES/DRESSINGS) ×3 IMPLANT
BNDG ESMARK 6X9 LF (GAUZE/BANDAGES/DRESSINGS) ×3
BNDG GAUZE ELAST 4 BULKY (GAUZE/BANDAGES/DRESSINGS) ×2 IMPLANT
CLOSURE WOUND 1/2 X4 (GAUZE/BANDAGES/DRESSINGS) ×1
COVER SURGICAL LIGHT HANDLE (MISCELLANEOUS) ×3 IMPLANT
CUFF TOURN SGL QUICK 34 (TOURNIQUET CUFF) ×3
CUFF TRNQT CYL 34X4X40X1 (TOURNIQUET CUFF) ×1 IMPLANT
DECANTER SPIKE VIAL GLASS SM (MISCELLANEOUS) ×3 IMPLANT
DRAPE C-ARM 42X120 X-RAY (DRAPES) IMPLANT
DRAPE OEC MINIVIEW 54X84 (DRAPES) IMPLANT
DRAPE SHEET LG 3/4 BI-LAMINATE (DRAPES) ×3 IMPLANT
DRAPE U-SHAPE 47X51 STRL (DRAPES) ×3 IMPLANT
DRSG ADAPTIC 3X8 NADH LF (GAUZE/BANDAGES/DRESSINGS) ×3 IMPLANT
DRSG MEPITEL 8X12 (GAUZE/BANDAGES/DRESSINGS) ×1 IMPLANT
DRSG PAD ABDOMINAL 8X10 ST (GAUZE/BANDAGES/DRESSINGS) ×3 IMPLANT
DRSG TEGADERM 2-3/8X2-3/4 SM (GAUZE/BANDAGES/DRESSINGS) ×4 IMPLANT
DRSG TEGADERM 8X12 (GAUZE/BANDAGES/DRESSINGS) ×2 IMPLANT
DURAPREP 26ML APPLICATOR (WOUND CARE) ×3 IMPLANT
ELECT REM PT RETURN 15FT ADLT (MISCELLANEOUS) ×3 IMPLANT
FACESHIELD WRAPAROUND (MASK) ×3 IMPLANT
FACESHIELD WRAPAROUND OR TEAM (MASK) ×1 IMPLANT
GAUZE IODOFORM PACK 1/2 7832 (GAUZE/BANDAGES/DRESSINGS) ×3 IMPLANT
GAUZE PACKING IODOFORM 1X5 (MISCELLANEOUS) ×3 IMPLANT
GAUZE SPONGE 4X4 12PLY STRL (GAUZE/BANDAGES/DRESSINGS) ×3 IMPLANT
GLOVE BIOGEL PI IND STRL 8 (GLOVE) ×1 IMPLANT
GLOVE BIOGEL PI INDICATOR 8 (GLOVE) ×2
GLOVE ECLIPSE 8.0 STRL XLNG CF (GLOVE) ×6 IMPLANT
GLOVE ORTHO TXT STRL SZ7.5 (GLOVE) ×3 IMPLANT
KIT BASIN OR (CUSTOM PROCEDURE TRAY) ×3 IMPLANT
MEPITEL 8X12 (GAUZE/BANDAGES/DRESSINGS) ×1
NDL MAYO CATGUT SZ4 TPR NDL (NEEDLE) ×1 IMPLANT
NEEDLE HYPO 22GX1.5 SAFETY (NEEDLE) ×3 IMPLANT
NEEDLE MAYO CATGUT SZ4 (NEEDLE) ×3 IMPLANT
NS IRRIG 1000ML POUR BTL (IV SOLUTION) ×3 IMPLANT
PACK ORTHO EXTREMITY (CUSTOM PROCEDURE TRAY) ×3 IMPLANT
PAD ABD 8X10 STRL (GAUZE/BANDAGES/DRESSINGS) ×2 IMPLANT
PAD CAST 4YDX4 CTTN HI CHSV (CAST SUPPLIES) ×2 IMPLANT
PADDING CAST COTTON 4X4 STRL (CAST SUPPLIES) ×6
PADDING CAST COTTON 6X4 STRL (CAST SUPPLIES) ×2 IMPLANT
POSITIONER SURGICAL ARM (MISCELLANEOUS) ×3 IMPLANT
SET IRRIG Y TYPE TUR BLADDER L (SET/KITS/TRAYS/PACK) ×2 IMPLANT
SOL PREP PROV IODINE SCRUB 4OZ (MISCELLANEOUS) ×2 IMPLANT
SPONGE LAP 4X18 X RAY DECT (DISPOSABLE) ×6 IMPLANT
STAPLER VISISTAT 35W (STAPLE) ×3 IMPLANT
STRIP CLOSURE SKIN 1/2X4 (GAUZE/BANDAGES/DRESSINGS) ×2 IMPLANT
SUT ETHILON 2 0 PS N (SUTURE) ×6 IMPLANT
SUT PDS AB 0 CT1 36 (SUTURE) ×2 IMPLANT
SUT PDS AB 3-0 SH 27 (SUTURE) ×2 IMPLANT
SUT PROLENE 3 0 PS 2 (SUTURE) ×3 IMPLANT
SUT VIC AB 2-0 CT1 27 (SUTURE) ×3
SUT VIC AB 2-0 CT1 TAPERPNT 27 (SUTURE) ×1 IMPLANT
SUT VIC AB 3-0 PS2 18 (SUTURE) ×3
SUT VIC AB 3-0 PS2 18XBRD (SUTURE) ×1 IMPLANT
SWAB COLLECTION DEVICE MRSA (MISCELLANEOUS) ×3 IMPLANT
SWAB CULTURE ESWAB REG 1ML (MISCELLANEOUS) ×3 IMPLANT
SYR CONTROL 10ML LL (SYRINGE) ×3 IMPLANT
WATER STERILE IRR 1500ML POUR (IV SOLUTION) ×3 IMPLANT
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
## 2017-05-23 NOTE — Transfer of Care (Signed)
Immediate Anesthesia Transfer of Care Note  Patient: [name] [name]  Procedure(s) Performed: Procedure(s): IRRIGATION AND DEBRIDEMENT KNEE (Right)  Patient Location: PACU  Anesthesia Type:General  Level of Consciousness: awake, alert  and oriented  Airway & Oxygen Therapy: Patient Spontanous Breathing and Patient connected to face mask oxygen  Post-op Assessment: Report given to RN and Post -op Vital signs reviewed and stable  Post vital signs: Reviewed and stable  Last Vitals:  Vitals:   05/22/17 2101  BP: 119/75  Pulse: 73  Resp: 18  Temp: 36.5 C  SpO2: 97%    Last Pain:  Vitals:   05/22/17 2310  TempSrc:   PainSc: 10-Worst pain ever         Complications: No apparent anesthesia complications

## 2017-05-23 NOTE — Anesthesia Postprocedure Evaluation (Signed)
Anesthesia Post Note  Patient: [name] [name]  Procedure(s) Performed: Procedure(s) (LRB): IRRIGATION AND DEBRIDEMENT KNEE (Right)     Patient location during evaluation: PACU Anesthesia Type: General Level of consciousness: awake and alert Pain management: pain level controlled Vital Signs Assessment: post-procedure vital signs reviewed and stable Respiratory status: spontaneous breathing, nonlabored ventilation, respiratory function stable and patient connected to nasal cannula oxygen Cardiovascular status: blood pressure returned to baseline and stable Postop Assessment: no signs of nausea or vomiting Anesthetic complications: no    Last Vitals:  Vitals:   05/23/17 0557 05/23/17 0658  BP: 135/84 (!) 142/75  Pulse: 75 75  Resp: 16   Temp: 36.8 C 36.6 C  SpO2: 98% 97%    Last Pain:  Vitals:   05/23/17 0715  TempSrc:   PainSc: 0-No pain                 Effie Berkshire

## 2017-05-23 NOTE — Anesthesia Procedure Notes (Signed)
Procedure Name: Intubation Date/Time: 05/23/2017 1:36 AM Performed by: Noralyn Pick D Pre-anesthesia Checklist: Patient identified, Emergency Drugs available, Suction available and Patient being monitored Patient Re-evaluated:Patient Re-evaluated prior to induction Oxygen Delivery Method: Circle system utilized Preoxygenation: Pre-oxygenation with 100% oxygen Induction Type: IV induction, Rapid sequence and Cricoid Pressure applied Laryngoscope Size: Mac and 4 Grade View: Grade I Tube type: Oral Tube size: 7.0 mm Number of attempts: 1 Airway Equipment and Method: Stylet Placement Confirmation: ETT inserted through vocal cords under direct vision,  positive ETCO2 and breath sounds checked- equal and bilateral Secured at: 22 cm Tube secured with: Tape Dental Injury: Teeth and Oropharynx as per pre-operative assessment

## 2017-05-23 NOTE — Progress Notes (Signed)
     Subjective: Day of Surgery Procedure(s) (LRB): IRRIGATION AND DEBRIDEMENT KNEE (Right)   Patient reports pain as mild, pain much better controlled than it was last night.  Resting comfortably in bed. No events since surgery.  We have discussed the treatment and restriction per Dr. Doran Durand.    Objective:   VITALS:   Vitals:   05/23/17 0557 05/23/17 0658  BP: 135/84 (!) 142/75  Pulse: 75 75  Resp: 16   Temp: 98.3 F (36.8 C) 97.9 F (36.6 C)  SpO2: 98% 97%    Neurovascular intact Incision: no drainage  LABS  Recent Labs  05/22/17 2316  HGB 14.1  HCT 40.9  WBC 7.6  PLT 237     Recent Labs  05/22/17 2316  NA 138  K 3.9  BUN 15  CREATININE 0.95  GLUCOSE 110*     Assessment/Plan: Day of Surgery Procedure(s) (LRB): IRRIGATION AND DEBRIDEMENT KNEE (Right)   Advance diet Up with therapy Discharge home if ready after receiving the 24 hrs of antibiotics and equipment ordered by Dr. Doran Durand. Follow up with Dr. Doran Durand as instructed   West Pugh. Arryn Terrones   PAC  05/23/2017, 8:33 AM
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
## 2017-05-23 NOTE — Progress Notes (Signed)
PT Cancellation Note  [name] MRN: [id number] DOB: 21-Oct-1950   Cancelled Treatment:    Reason Eval/Treat Not Completed: Order received. Chart reviewed. Pt reported he has not received the brace for his L LE yet. Will check back later today. If brace has not arrived by this afternoon, will have ortho tech bring a KI to room to use until hinged brace arrives.    Weston Anna, MPT Pager: [phone]

## 2017-05-23 NOTE — Discharge Instructions (Signed)
Wylene Simmer, MD [place]  Please read the following information regarding your care after surgery.  Medications  You only need a prescription for the narcotic pain medicine (ex. oxycodone, Percocet, Norco).  All of the other medicines listed below are available over the counter. X Aleve 2 pills twice a day for the first 3 days after surgery. X acetominophen (Tylenol) 650 mg every 4-6 hours as you need for minor to moderate pain X oxycodone as prescribed for severe pain  Narcotic pain medicine (ex. oxycodone, Percocet, Vicodin) will cause constipation.  To prevent this problem, take the following medicines while you are taking any pain medicine. X docusate sodium (Colace) 100 mg twice a day X senna (Senokot) 2 tablets twice a day  X To help prevent blood clots, take a baby aspirin (81 mg) twice a day for two weeks after surgery.  You should also get up every hour while you are awake to move around.    Weight Bearing ? Bear weight when you are able on your operated leg or foot. X Bear weight only on your LEFT foot in the post-op boot. X Do not bear any weight on the RIGHT operated leg or foot.  Cast / Splint / Dressing X Keep your splint, cast or dressing clean and dry.  Dont put anything (coat hanger, pencil, etc) down inside of it.  If it gets damp, use a hair dryer on the cool setting to dry it.  If it gets soaked, call the office to schedule an appointment for a cast change. ? Remove your dressing 3 days after surgery and cover the incisions with dry dressings.    After your dressing, cast or splint is removed; you may shower, but do not soak or scrub the wound.  Allow the water to run over it, and then gently pat it dry.  Swelling It is normal for you to have swelling where you had surgery.  To reduce swelling and pain, keep your toes above your nose for at least 3 days after surgery.  It may be necessary to keep your foot or leg elevated for several weeks.  If it  hurts, it should be elevated.  Follow Up Call my office at [phone] when you are discharged from the hospital or surgery center to schedule an appointment to be seen two weeks after surgery.  Call my office at [phone] if you develop a fever >101.5 F, nausea, vomiting, bleeding from the surgical site or severe pain.
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
## 2017-05-23 NOTE — Care Management Note (Signed)
Case Management Note  Patient Details  Name: [name] MRN: [id number] Date of Birth: October 02, 1950  Subjective/Objective:   Received call from Bedside RN that pt was requesting "copy of HCPOA" . CM and RN looked at scanned documents to see if we had in fact received these on admission, RN will look into Nursing Hx to see if there is any mention of these documents. Made RN aware these would have been completed at [place] office and brought to hospital to be copied with certified copy being returned to pt at time.                  Action/Plan:CM will sign off for now but will be available should additional needs arise.  Expected Discharge Date:  05/24/17               Expected Discharge Plan:     In-House Referral:     Discharge planning Services     Post Acute Care Choice:    Choice offered to:     DME Arranged:    DME Agency:     HH Arranged:    HH Agency:     Status of Service:     If discussed at [employer], dates discussed:    Additional Comments:  Delrae Sawyers, RN 05/23/2017, 2:20 PM

## 2017-05-23 NOTE — Progress Notes (Signed)
Patient has been placed in low bed. Mats are on floor on both sides of the bed. Bed alarm is engaged and call bell is within reach of patient.

## 2017-05-23 NOTE — Progress Notes (Signed)
Spoke to patient who stated he had a rolling walker from previous surgery.  No other needs voiced at that time.   Rayburn Ma RN, BSN, CM
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
## 2017-05-24 ENCOUNTER — Encounter (HOSPITAL_COMMUNITY): Payer: Self-pay | Admitting: Orthopedic Surgery

## 2017-05-24 MED ORDER — DOCUSATE SODIUM 100 MG PO CAPS: 100.0000 mg | ORAL_CAPSULE | Freq: Two times a day (BID) | ORAL | 0 refills | Status: DC

## 2017-05-24 MED ORDER — SENNA 8.6 MG PO TABS: 2.0000 | ORAL_TABLET | Freq: Two times a day (BID) | ORAL | 0 refills | Status: DC

## 2017-05-24 MED ORDER — ASPIRIN EC 81 MG PO TBEC: 81.0000 mg | DELAYED_RELEASE_TABLET | Freq: Two times a day (BID) | ORAL | 0 refills | Status: DC
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
## 2017-05-24 NOTE — Care Management CC44 (Signed)
Condition Code 44 Documentation Completed  Patient Details  Name: [name] [name] MRN: [id number] Date of Birth: 22-Jun-1951   Condition Code 44 given:  Yes Patient signature on Condition Code 44 notice:  Yes Documentation of 2 MD's agreement:  Yes Code 44 added to claim:  Yes    Guadalupe Maple, RN 05/24/2017, 9:47 AM
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
## 2017-05-24 NOTE — Progress Notes (Addendum)
Subjective: 1 Day Post-Op Procedure(s) (LRB): IRRIGATION AND DEBRIDEMENT KNEE (Right)  Patient reports pain as mild to moderate.  Tolerating POs well.  Admits to flatus.  Denies fever, chills, N/V, SOB, CP.  Objective:   VITALS:  Temp:  [98.3 F (36.8 C)-99.5 F (37.5 C)] 99.2 F (37.3 C) (09/10 0503) Pulse Rate:  [72-86] 76 (09/10 0503) Resp:  [16] 16 (09/10 0503) BP: (115-130)/(71-83) 125/83 (09/10 0503) SpO2:  [95 %-98 %] 98 % (09/10 0503) Weight:  [72.6 kg (160 lb)] 72.6 kg (160 lb) (09/09 1927)  General: WDWN patient in NAD. Psych:  Appropriate mood and affect. Neuro:  A&O x 3, Moving all extremities, sensation intact to light touch HEENT:  EOMs intact Chest:  Even non-labored respirations Skin:  Dressing C/D/I, no rashes or lesions. KI intact Extremities: warm/dry, no edema, erythmea, or echymosis.  No lymphadenopathy. Pulses: Dorsalis pedis and post tibialis 2+ MSK:  ROM: full ankle ROM on R.  EHL/FHL intact bilaterally, MMT: patient is able to perform a quad set, (-) Homan's    LABS  Recent Labs  05/22/17 2316  HGB 14.1  WBC 7.6  PLT 237    Recent Labs  05/22/17 2316  NA 138  K 3.9  CL 104  CO2 20*  BUN 15  CREATININE 0.95  GLUCOSE 110*   No results for input(s): LABPT, INR in the last 72 hours.   Assessment/Plan: 1 Day Post-Op Procedure(s) (LRB): IRRIGATION AND DEBRIDEMENT KNEE (Right)  D/C home today after receiving TROM brace Scripts on chart NWB R LE.  TROM brace at all times. WBAT L LE in CAM boot. Plan for 2 week outpatient post-op visit with Dr. Doran Durand.  Mechele Claude, PA-C [facility] Office:  [phone]

## 2017-05-24 NOTE — Care Management Obs Status (Signed)
[facility] NOTIFICATION   Patient Details  Name: Brazos Sandoval MRN: [id number] Date of Birth: Jul 19, 1951   Medicare Observation Status Notification Given:  Yes    Guadalupe Maple, RN 05/24/2017, 9:47 AM
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
## 2017-07-03 IMAGING — CR DG CHEST 1V PORT
1 series · 1 of 1 positions shown · non-contrast
Comparison: Chest radiograph and CT of the chest performed
09/18/2015

CLINICAL DATA: Acute onset of worsening shortness of breath.
Initial encounter.

EXAM:
PORTABLE CHEST 1 VIEW

[AP]
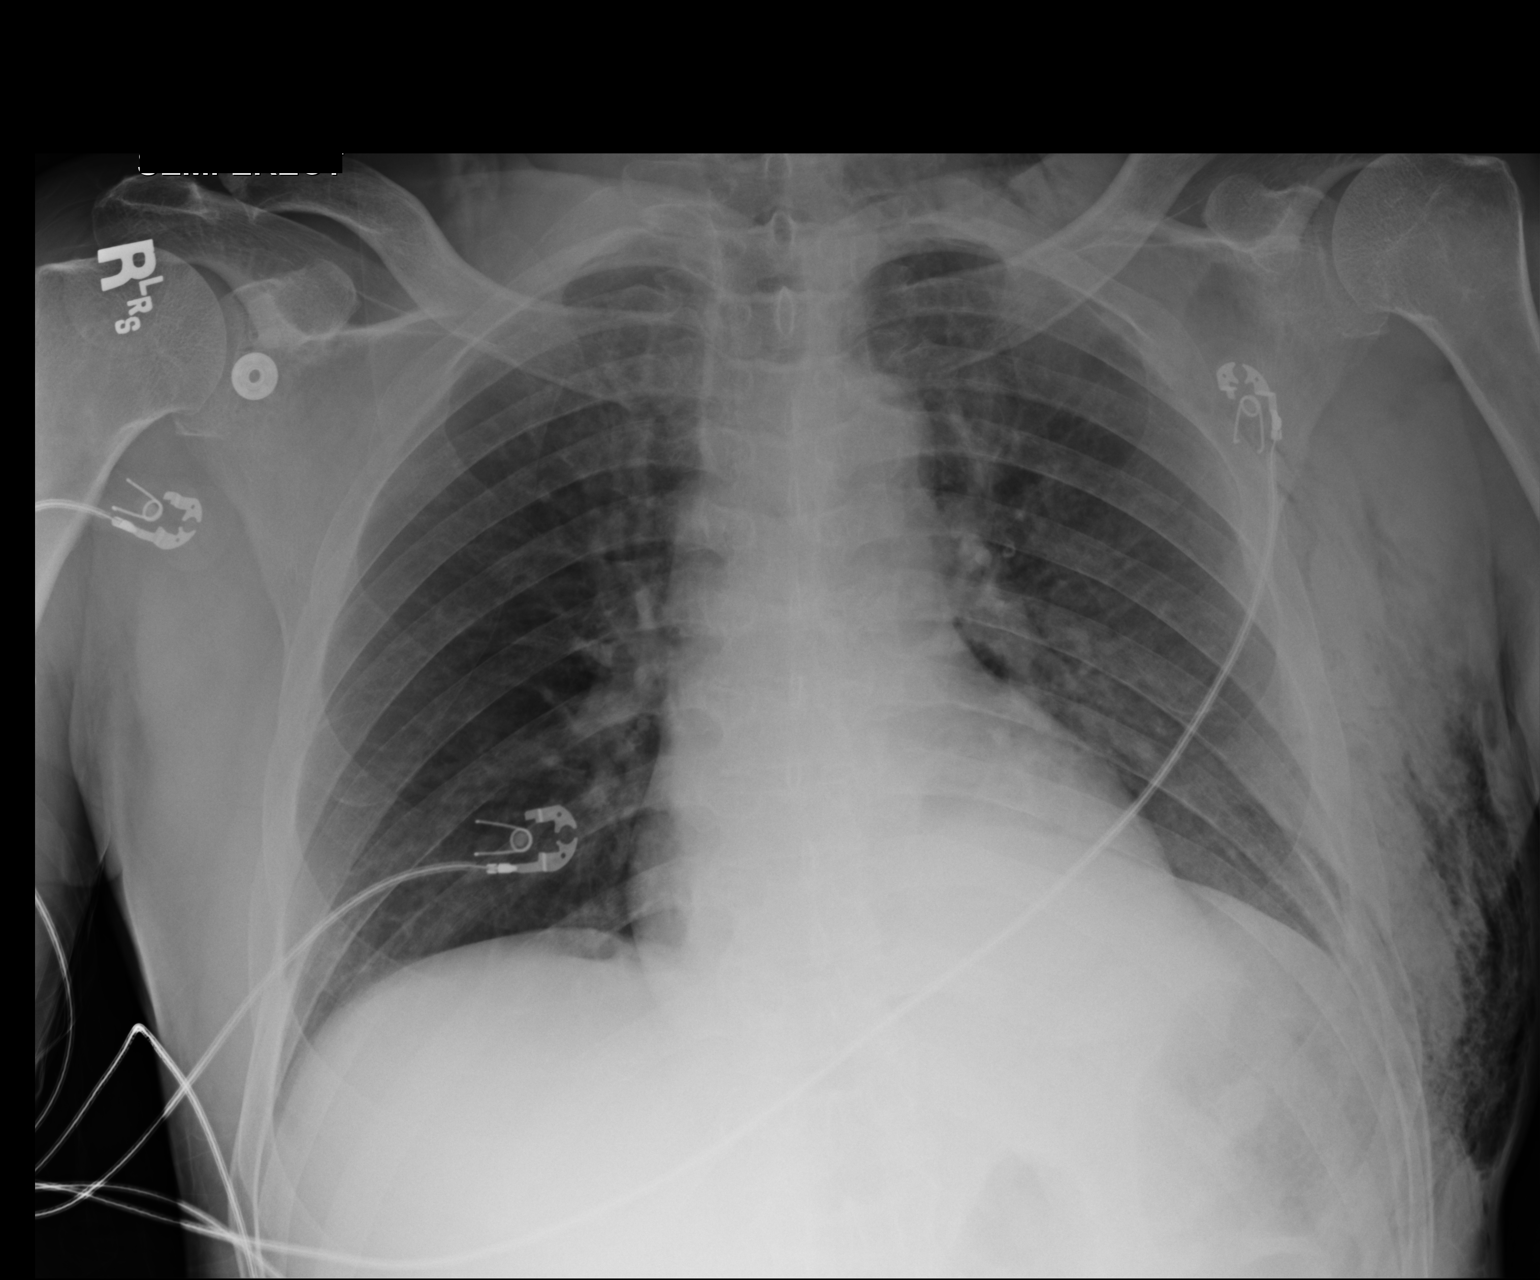

[1 of 1 positions shown; findings below may reference images not displayed]

FINDINGS: A trace left-sided pneumothorax is noted, decreased in size from the
prior study. Mild left-sided atelectasis is noted. Underlying small
left-sided hemothorax is likely still present. The right lung
appears clear.

The cardiomediastinal silhouette is within normal limits. Left-sided
rib fractures are again noted. Prominent soft tissue air is noted
along the left side of the chest.
IMPRESSION: 1. Trace left-sided pneumothorax, decreased in size from the prior
study. Mild left-sided atelectasis noted. Underlying small
left-sided hemothorax is likely still present.
2. Left-sided rib fractures again noted. Prominent soft tissue air
along the left side of the chest.

## 2017-07-03 IMAGING — CR DG CHEST 1V PORT
1 series · 1 of 1 positions shown · non-contrast
Comparison: Portable chest x-ray of today's date at [DATE] a.m.

CLINICAL DATA: Known left rib fractures and tiny left pneumothorax

EXAM:
PORTABLE CHEST 1 VIEW

[AP]
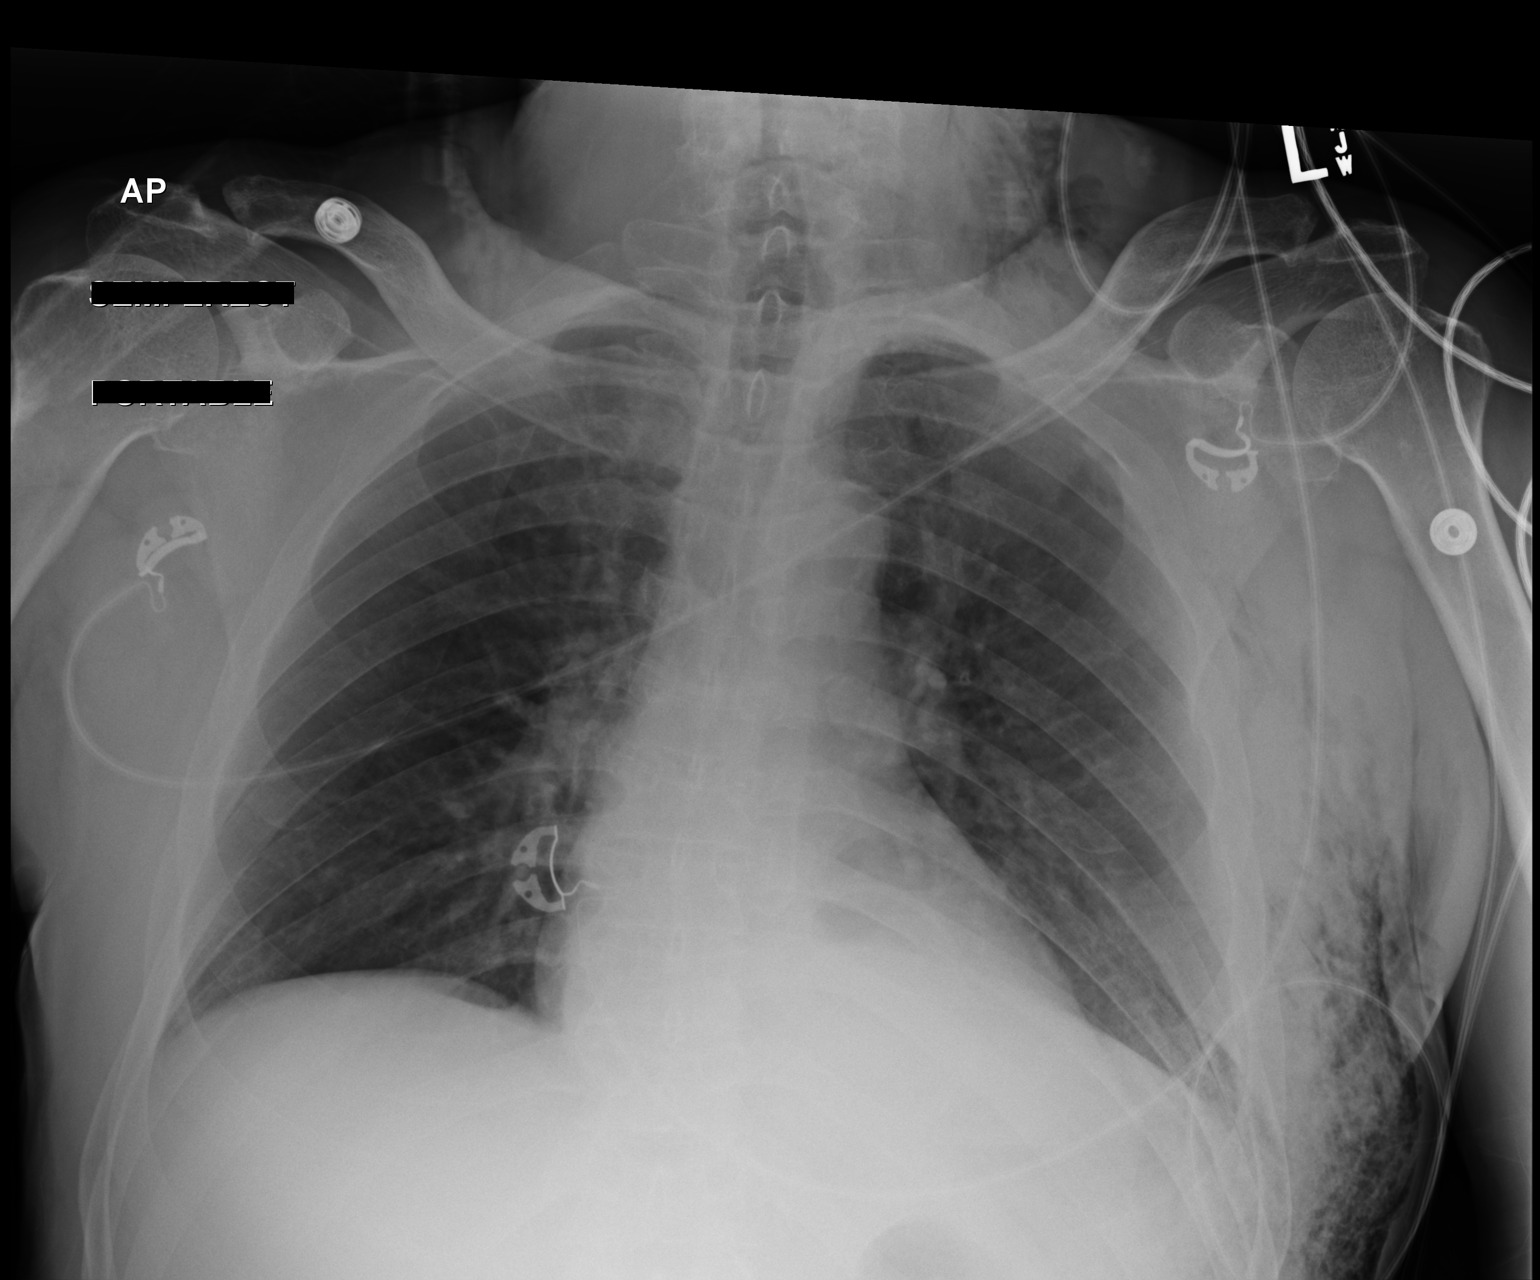

[1 of 1 positions shown; findings below may reference images not displayed]

FINDINGS: The tiny pleural line demonstrated on the previous study is faintly
visible on the current study. This consistent with a less than 5%
left-sided pneumothorax. There remains a moderate amount of
subcutaneous emphysema in the left axillary region. Multiple left
lateral rib fractures are observed. There is no mediastinal shift.
There is no significant pleural effusion. The right lung is clear.
The heart and pulmonary vascularity are normal.
IMPRESSION: Stable appearance of the left hemithorax since a study of earlier
today. A less than 5% pneumothorax persists. A trace of pleural
fluid on the left is stable as well.

## 2017-07-04 IMAGING — CR DG CHEST 1V PORT
1 series · 1 of 1 positions shown · non-contrast
Comparison: 09/19/2015 .  CT 09/18/2015 .

CLINICAL DATA: Multiple rib fractures.

EXAM:
PORTABLE CHEST 1 VIEW

[AP]
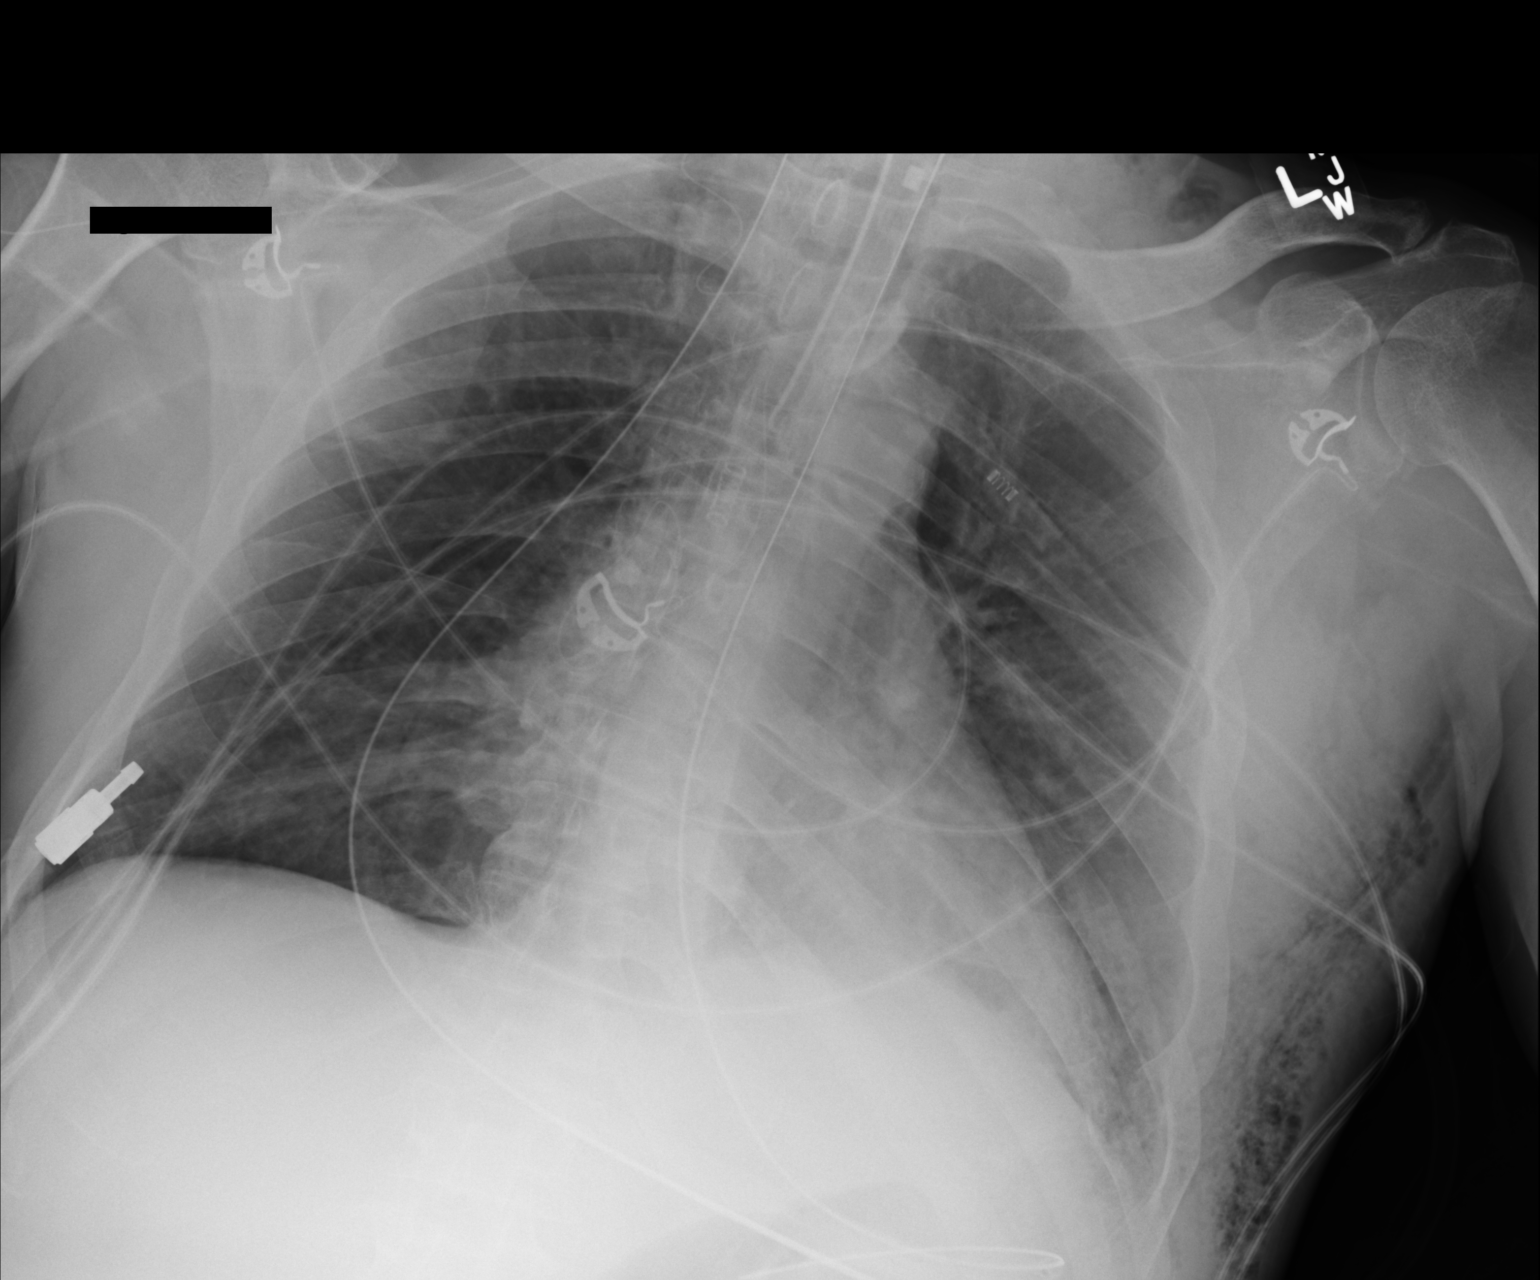

[1 of 1 positions shown; findings below may reference images not displayed]

FINDINGS: Endotracheal tube tip 3.5 cm above the carina. NG tube tip below the
left hemidiaphragm. Left base atelectasis and/or infiltrate.
Cardiomegaly. Small left pleural effusion. Miniscule left apical
pneumothorax again cannot be excluded. Multiple left rib fractures.
Diffuse left chest wall subcutaneous emphysema has improved .
IMPRESSION: 1. Endotracheal tube and NG tube in good anatomic position.
2. Low lung volumes with mild left base atelectasis and/or
infiltrate/contusion. Miniscule left apical pneumothorax again
cannot be excluded.
3. Left rib fractures again noted. Left chest wall and neck
subcutaneous emphysema again noted. Subcutaneous emphysema has
improved slightly from prior exam .

## 2017-07-04 IMAGING — CR DG CHEST 1V PORT
1 series · 1 of 1 positions shown · non-contrast
Comparison: Portable chest x-ray September 20, 2015

CLINICAL DATA: Status post central line and chest tube placement

EXAM:
PORTABLE CHEST 1 VIEW

[AP]
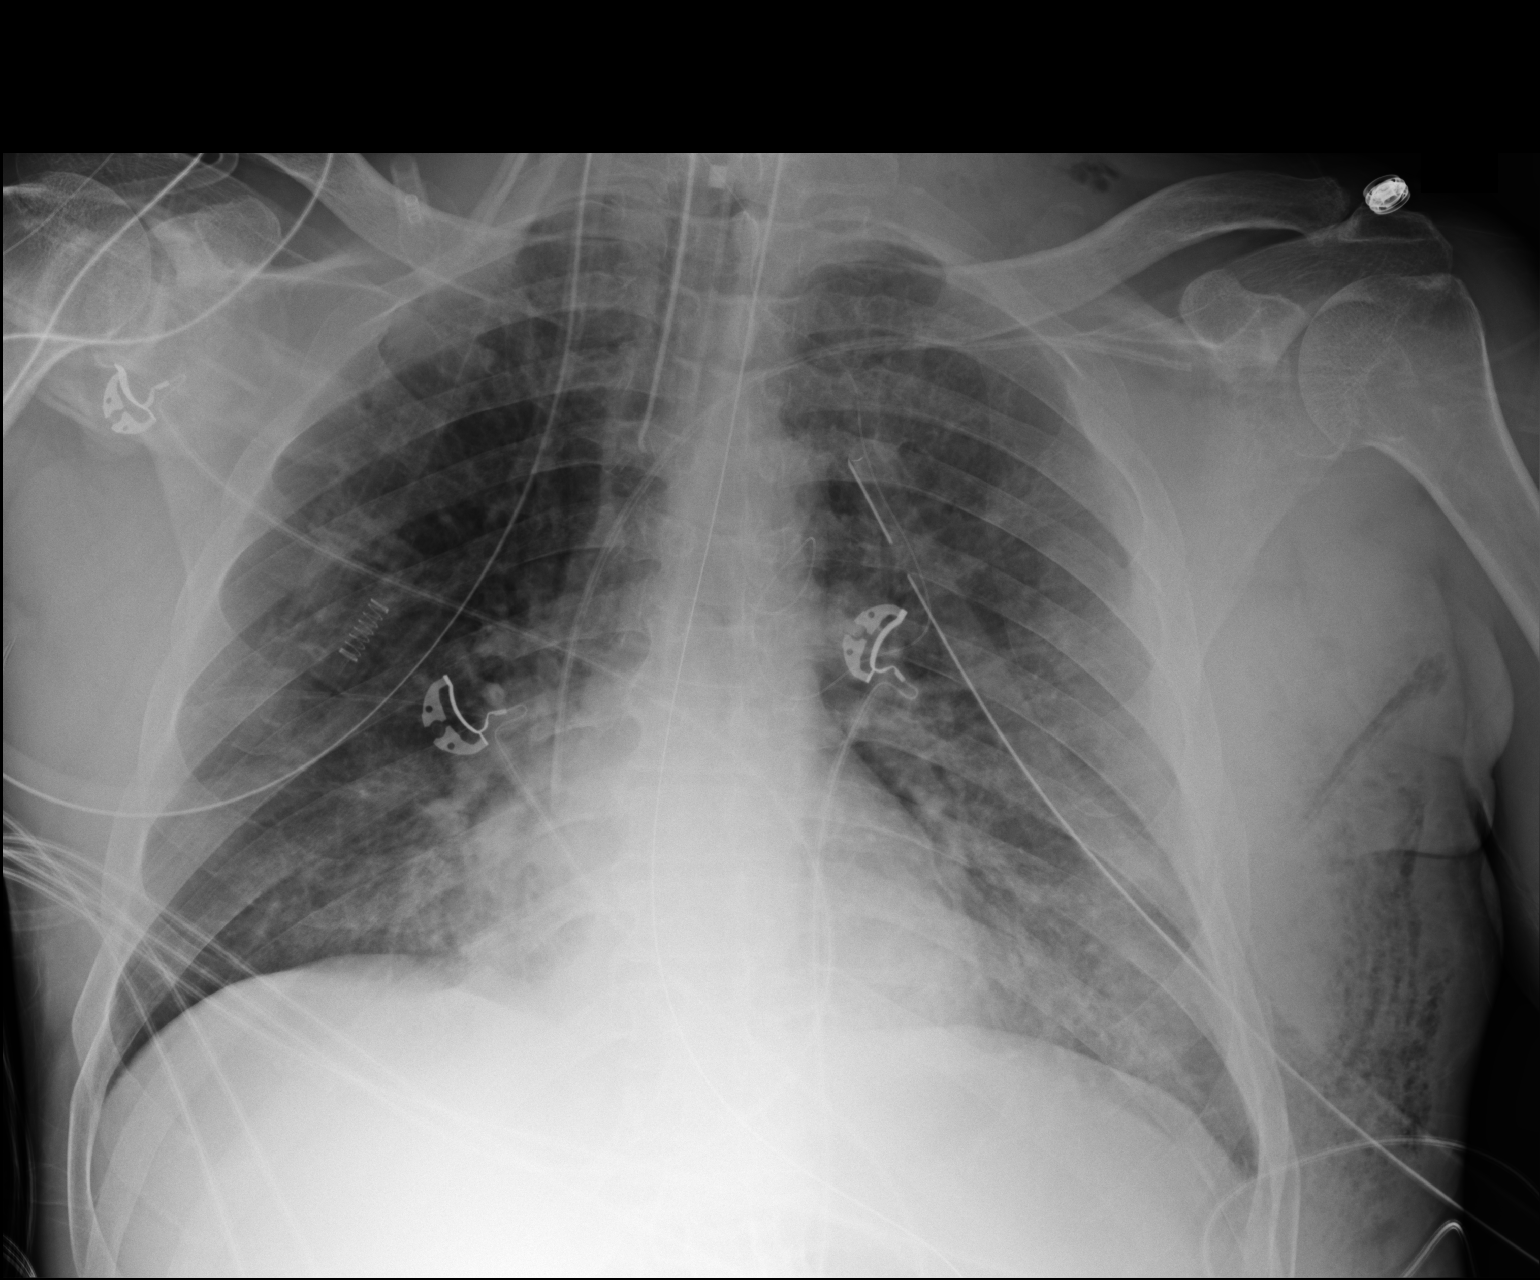

[1 of 1 positions shown; findings below may reference images not displayed]

FINDINGS: There has been interval placement of a left-sided chest tube. The
tip of the tube overlies the posterior aspect of the left fifth rib.
There is no pneumothorax or significant pleural effusion. A left
subclavian venous catheter is been placed with the tip projecting
over the distal third of the SVC. The endotracheal tube and
esophagogastric tube are unchanged. The lungs are adequately
inflated. The interstitial markings remain coarse especially on the
left. The heart and pulmonary vascularity are normal. Multiple
left-sided lateral rib fractures can be faintly discerned.
IMPRESSION: There is no postprocedure complication following placement of the
left-sided chest tube and left subclavian venous catheter. There is
no significant pleural effusion or pneumothorax currently.

## 2017-07-04 IMAGING — CR DG CHEST 1V PORT
1 series · 1 of 1 positions shown · non-contrast
Comparison: Chest radiograph 09/20/2015.

CLINICAL DATA: Patient status post ET tube placement.

EXAM:
PORTABLE CHEST 1 VIEW

[AP]
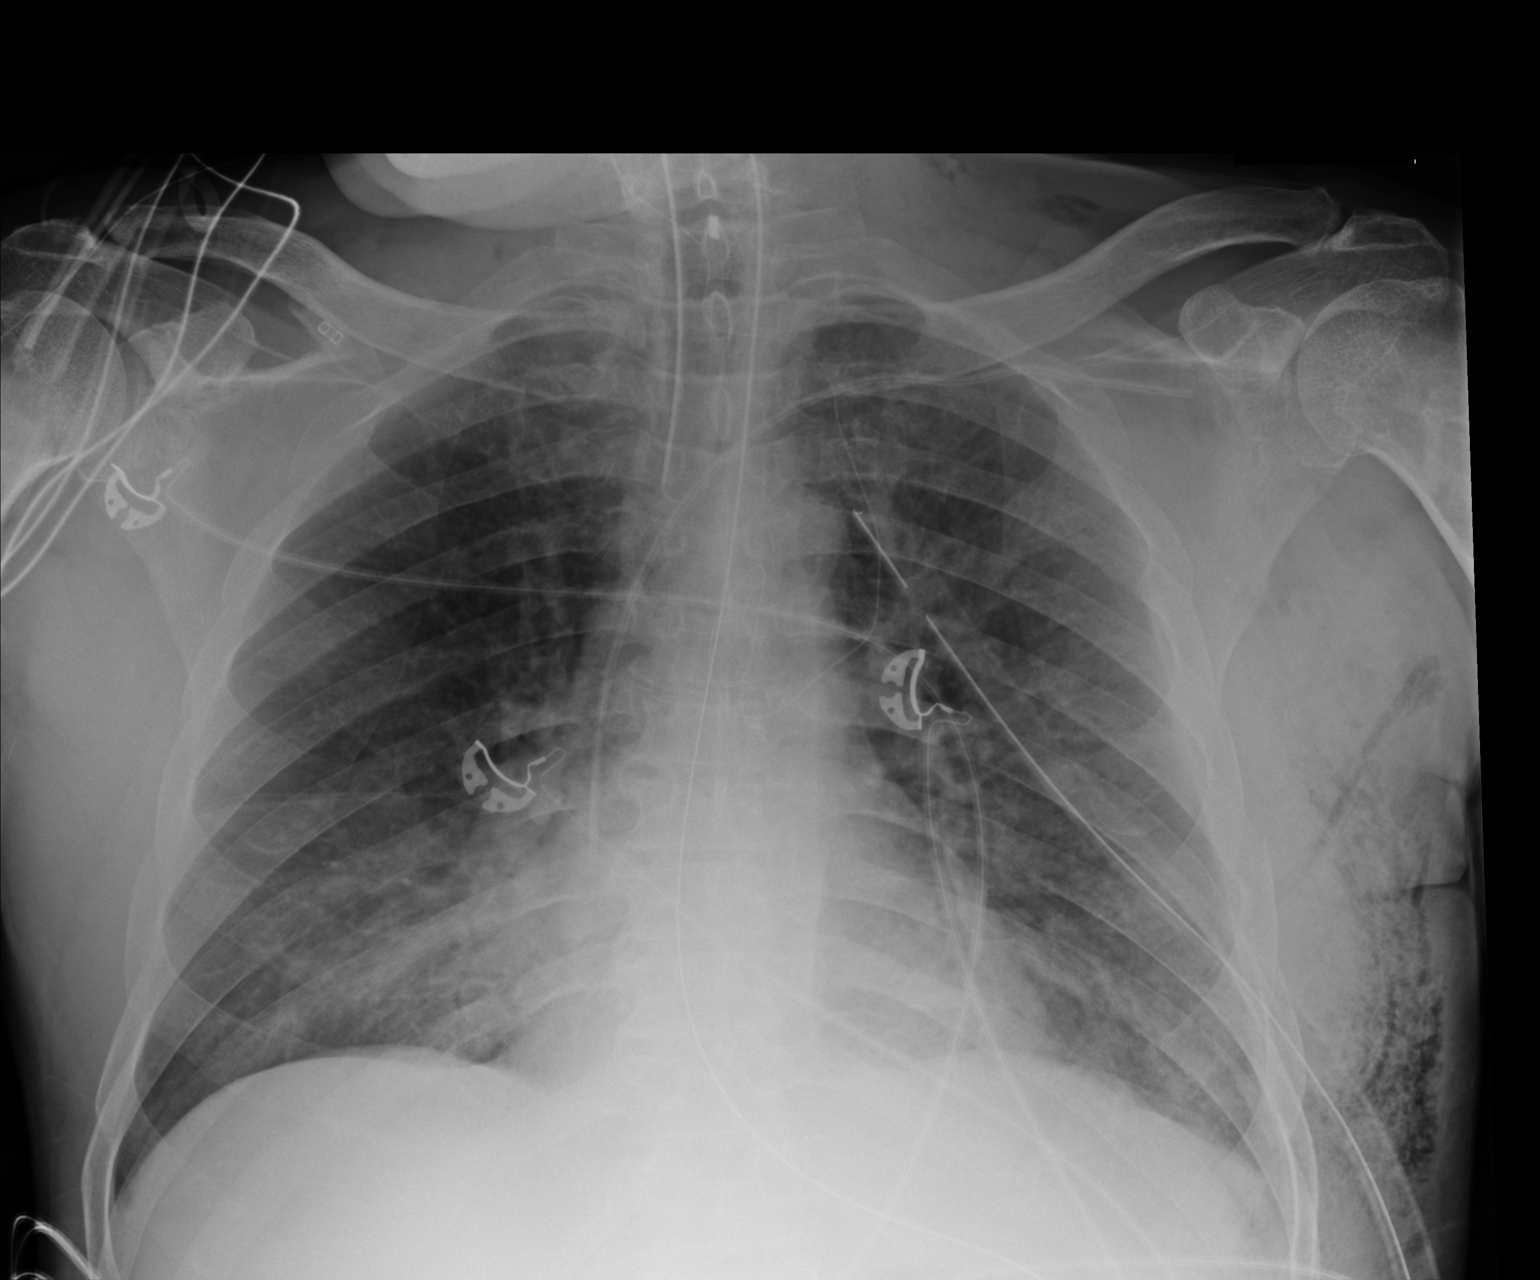

[1 of 1 positions shown; findings below may reference images not displayed]

FINDINGS: ETT terminates in the mid trachea. Left subclavian central venous
catheter tip projects over the superior cavoatrial junction. Enteric
tube courses inferior to the diaphragm. Stable cardiac and
mediastinal contours. Minimal heterogeneous opacities left lung
base. Subcutaneous emphysema overlying the left lateral chest wall.
No definite pneumothorax. Multiple left-sided rib fractures.
IMPRESSION: Stable chest radiograph.  Stable support apparatus.
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
## 2017-07-06 IMAGING — CR DG CHEST 1V PORT
1 series · 1 of 1 positions shown · non-contrast
Comparison: Chest radiograph 09/21/2015.

CLINICAL DATA: Intubated patient with left-sided chest tube.

EXAM:
PORTABLE CHEST 1 VIEW

[AP]
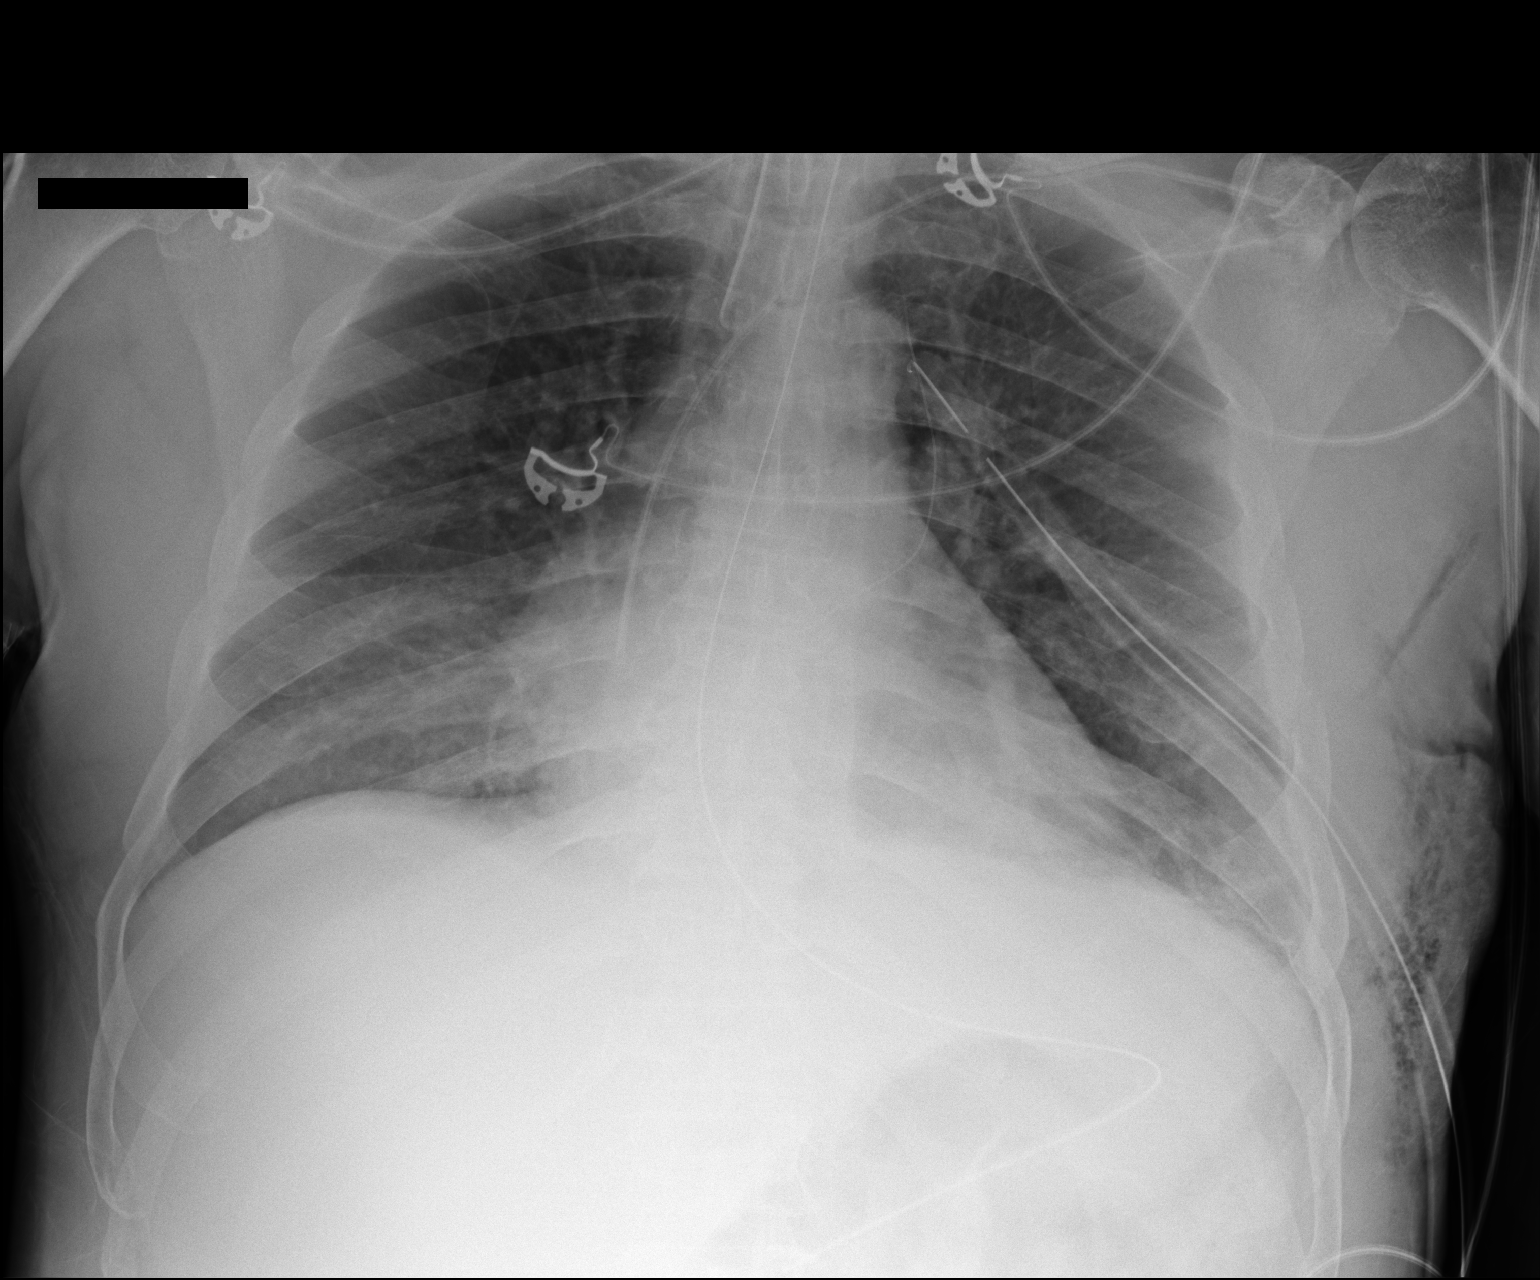

[1 of 1 positions shown; findings below may reference images not displayed]

FINDINGS: ET tube terminates in the mid trachea. Central venous catheter tip
projects over the right atrium, unchanged. Enteric tube courses
inferior to the diaphragm. Left chest tube remains in place. Stable
cardiac and mediastinal contours. Low lung volumes. Basilar
heterogeneous opacities. No pneumothorax. Subcutaneous emphysema
overlying the left chest wall. Multiple left-sided rib fractures.
IMPRESSION: Left chest tube remains in place.  No definite pneumothorax.

Low lung volumes with basilar opacities, unchanged. This may
represent atelectasis or contusion.

## 2017-07-07 IMAGING — CR DG CHEST 1V PORT
1 series · 1 of 1 positions shown · non-contrast
Comparison: CT 09/22/2015.  Chest x-ray 09/22/2015 .

CLINICAL DATA: Respiratory failure.

EXAM:
PORTABLE CHEST 1 VIEW

[AP]
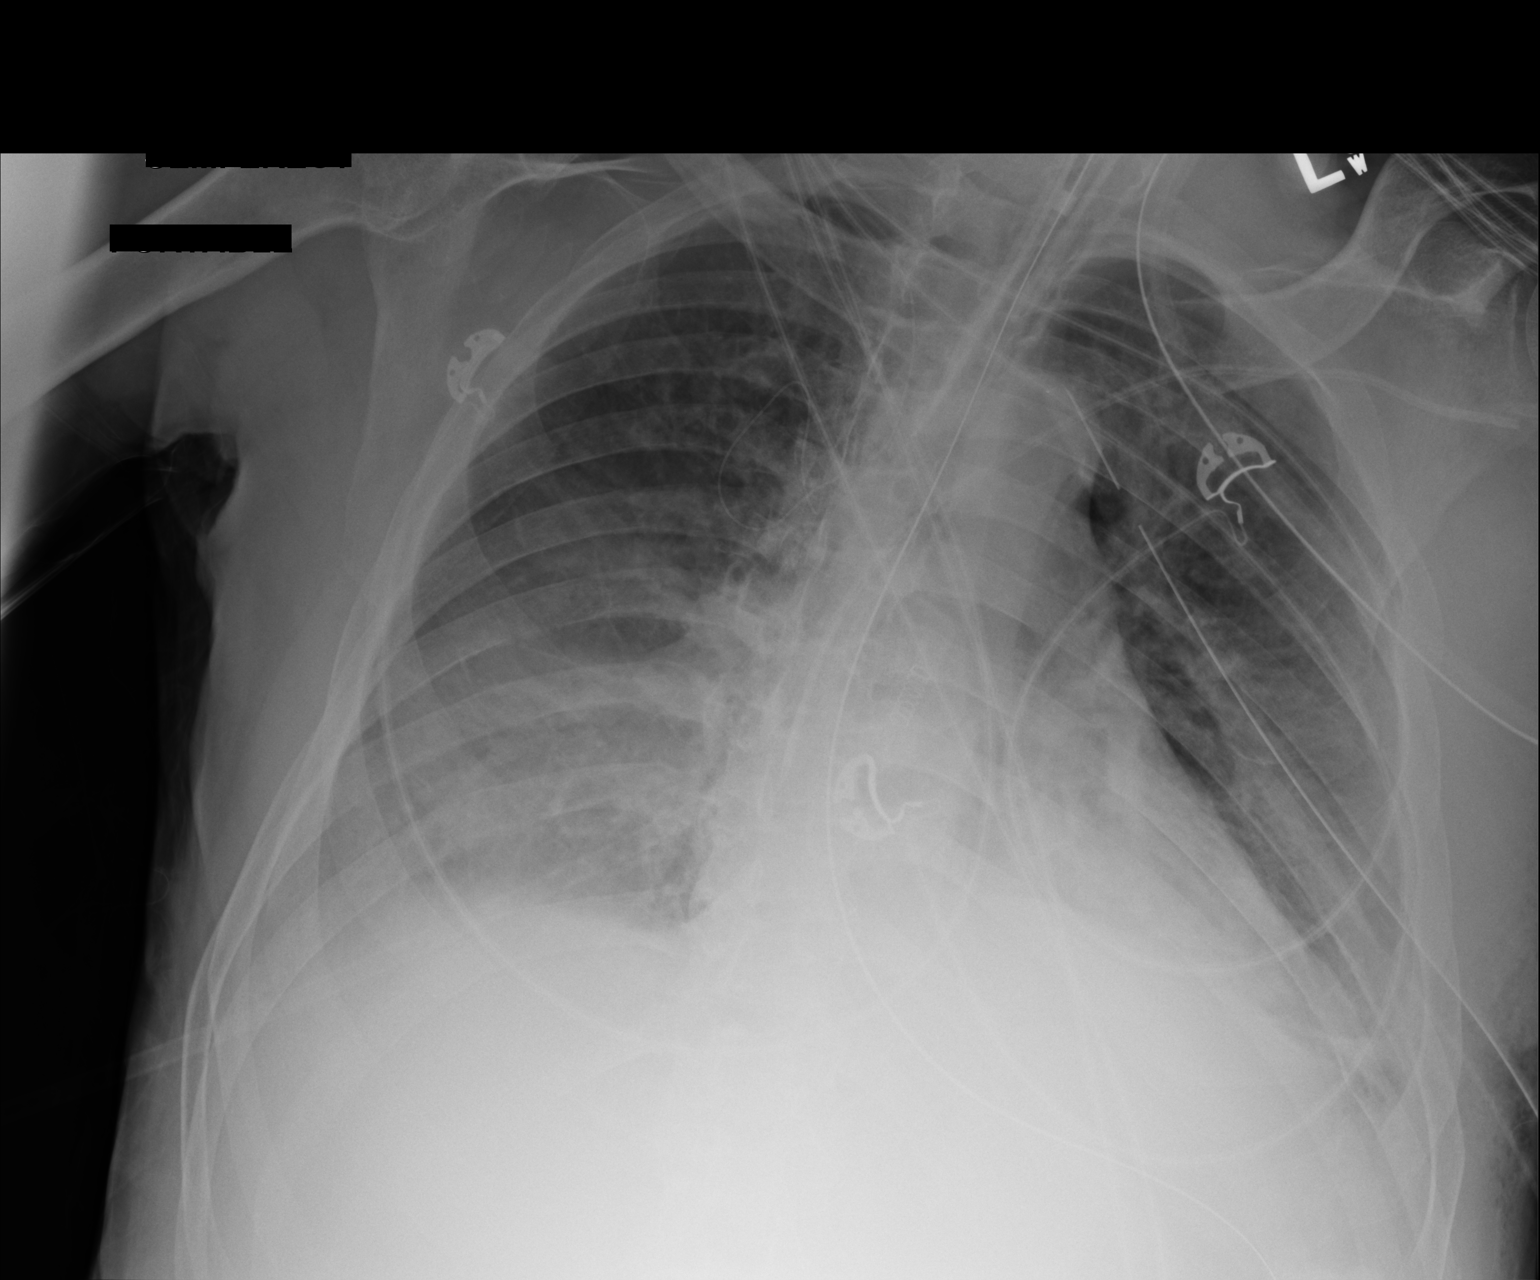

[1 of 1 positions shown; findings below may reference images not displayed]

FINDINGS: New endotracheal tube, NG tube, left chest tube in stable position.
Mediastinum hilar structures are stable. Stable cardiomegaly.
Persistent bilateral pulmonary infiltrates/edema and/or contusions
are noted. Small pleural effusions cannot be excluded. No
pneumothorax. Multiple left rib fractures best identified by CT.
IMPRESSION: 1. Lines and tubes in stable position. Left chest tube in stable
position. No pneumothorax.
2. Persistent bilateral pulmonary infiltrates/edema and/or
contusions. Small bilateral pleural effusions cannot be excluded.
3. Multiple left rib fractures best identified by CT.
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
## 2017-07-09 IMAGING — CR DG CHEST 1V PORT
1 series · 1 of 1 positions shown · non-contrast
Comparison: 09/24/2015.

CLINICAL DATA: Respiratory failure.

EXAM:
PORTABLE CHEST 1 VIEW

[AP]
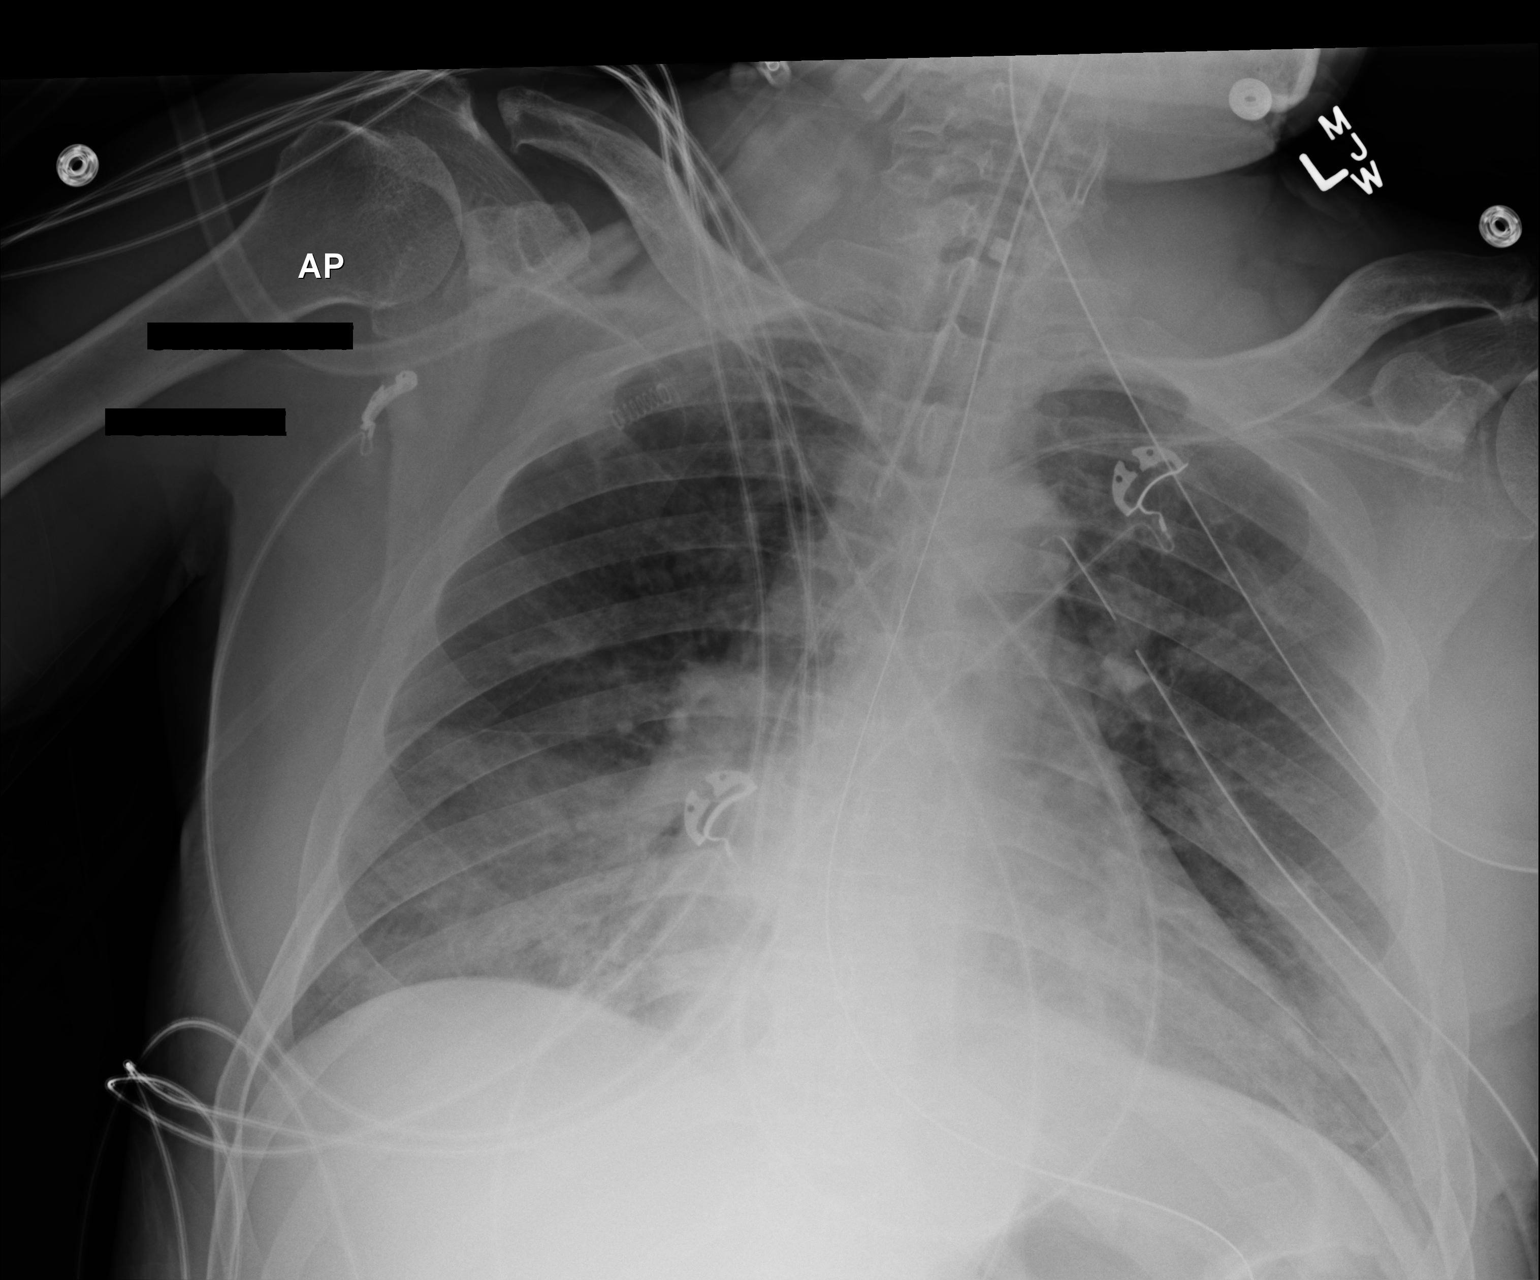

[1 of 1 positions shown; findings below may reference images not displayed]

FINDINGS: Endotracheal tube, NG tube, left subclavian line, left chest tube in
stable position. Cardiomegaly. Bilateral pulmonary infiltrates
and/or contusions noted. No pleural effusion or pneumothorax.
Multiple left rib fractures again noted. Left chest wall
subcutaneous emphysema has improved .
IMPRESSION: 1. Line and tubes in stable position.
2. Persistent bilateral pulmonary infiltrates and or contusion. No
interim change .
3. Multiple left rib fractures again noted. Left chest wall
subcutaneous emphysema has improved.

## 2017-07-10 IMAGING — CR DG CHEST 1V PORT
1 series · 1 of 1 positions shown · non-contrast
Comparison: Portable chest x-ray July 25, 2016

CLINICAL DATA: Ventilator dependent respiratory failure

EXAM:
PORTABLE CHEST 1 VIEW

[AP]
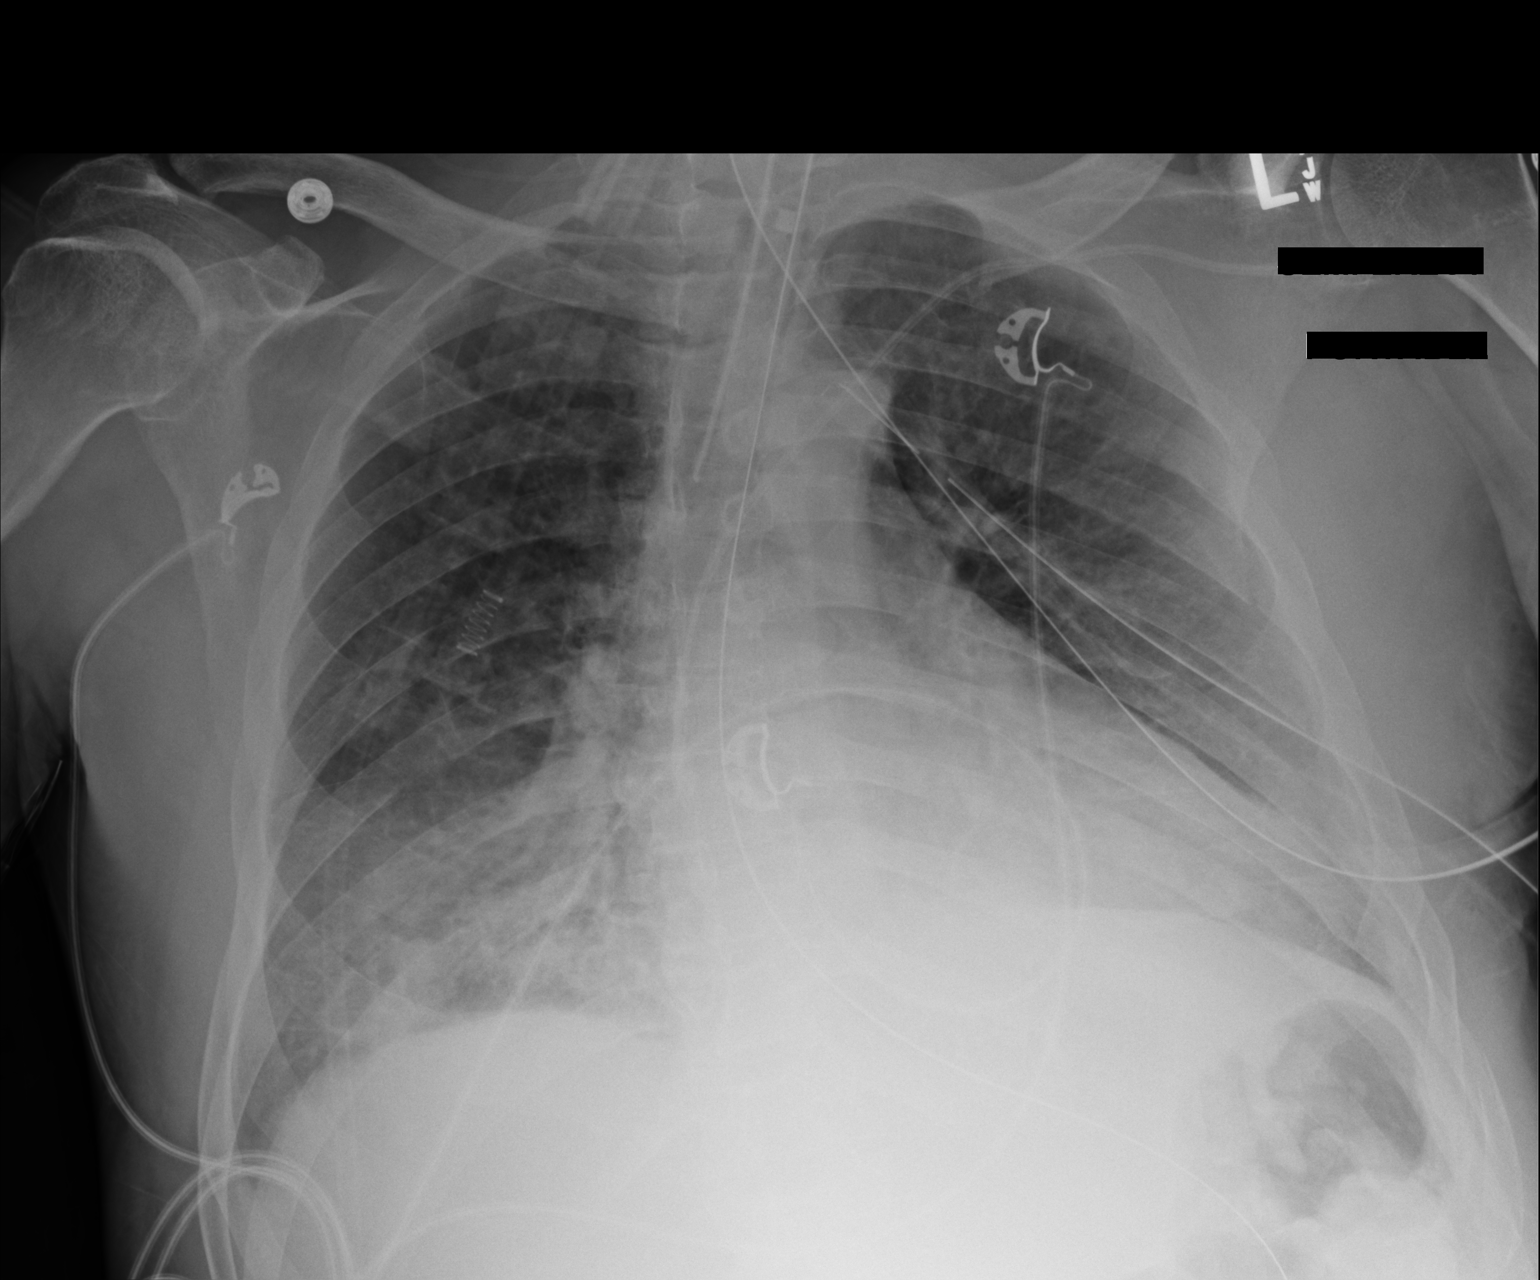

[1 of 1 positions shown; findings below may reference images not displayed]

FINDINGS: The lungs are adequately inflated. A faint pleural line is noted at
the left pulmonary apex likely reflecting a less than 5%
pneumothorax. The left-sided chest tube tip is stable with its tip
overlying the upper aspect of the aortic arch. Persistent increased
density in the right lung base is present and has become more
conspicuous. Subtle increased density in the retrocardiac region on
the left has also developed. There is a small left pleural effusion.

The cardiac silhouette is top-normal in size. The pulmonary
vascularity is not engorged. The endotracheal tube tip lies 2.5 cm
above the carina. The esophagogastric tube tip projects below the
inferior margin of the image. The left subclavian venous catheter
tip projects over the midportion of the SVC.

Known left-sided rib fractures are not well demonstrated on today's
study.
IMPRESSION: 1. Tiny less than 5% left apical pneumothorax is suspected. The
left-sided chest tube is in stable position.
2. Slight interval worsening of bibasilar infiltrates most likely
reflecting pneumonia. The endotracheal tube, esophagogastric tube
and left subclavian venous catheter are in reasonable position.

## 2017-07-11 IMAGING — CR DG CHEST 1V PORT
1 series · 1 of 1 positions shown · non-contrast
Comparison: 09/25/2005

CLINICAL DATA: Ventilator dependent respiratory failure.  COPD.

EXAM:
PORTABLE CHEST 1 VIEW

[AP]
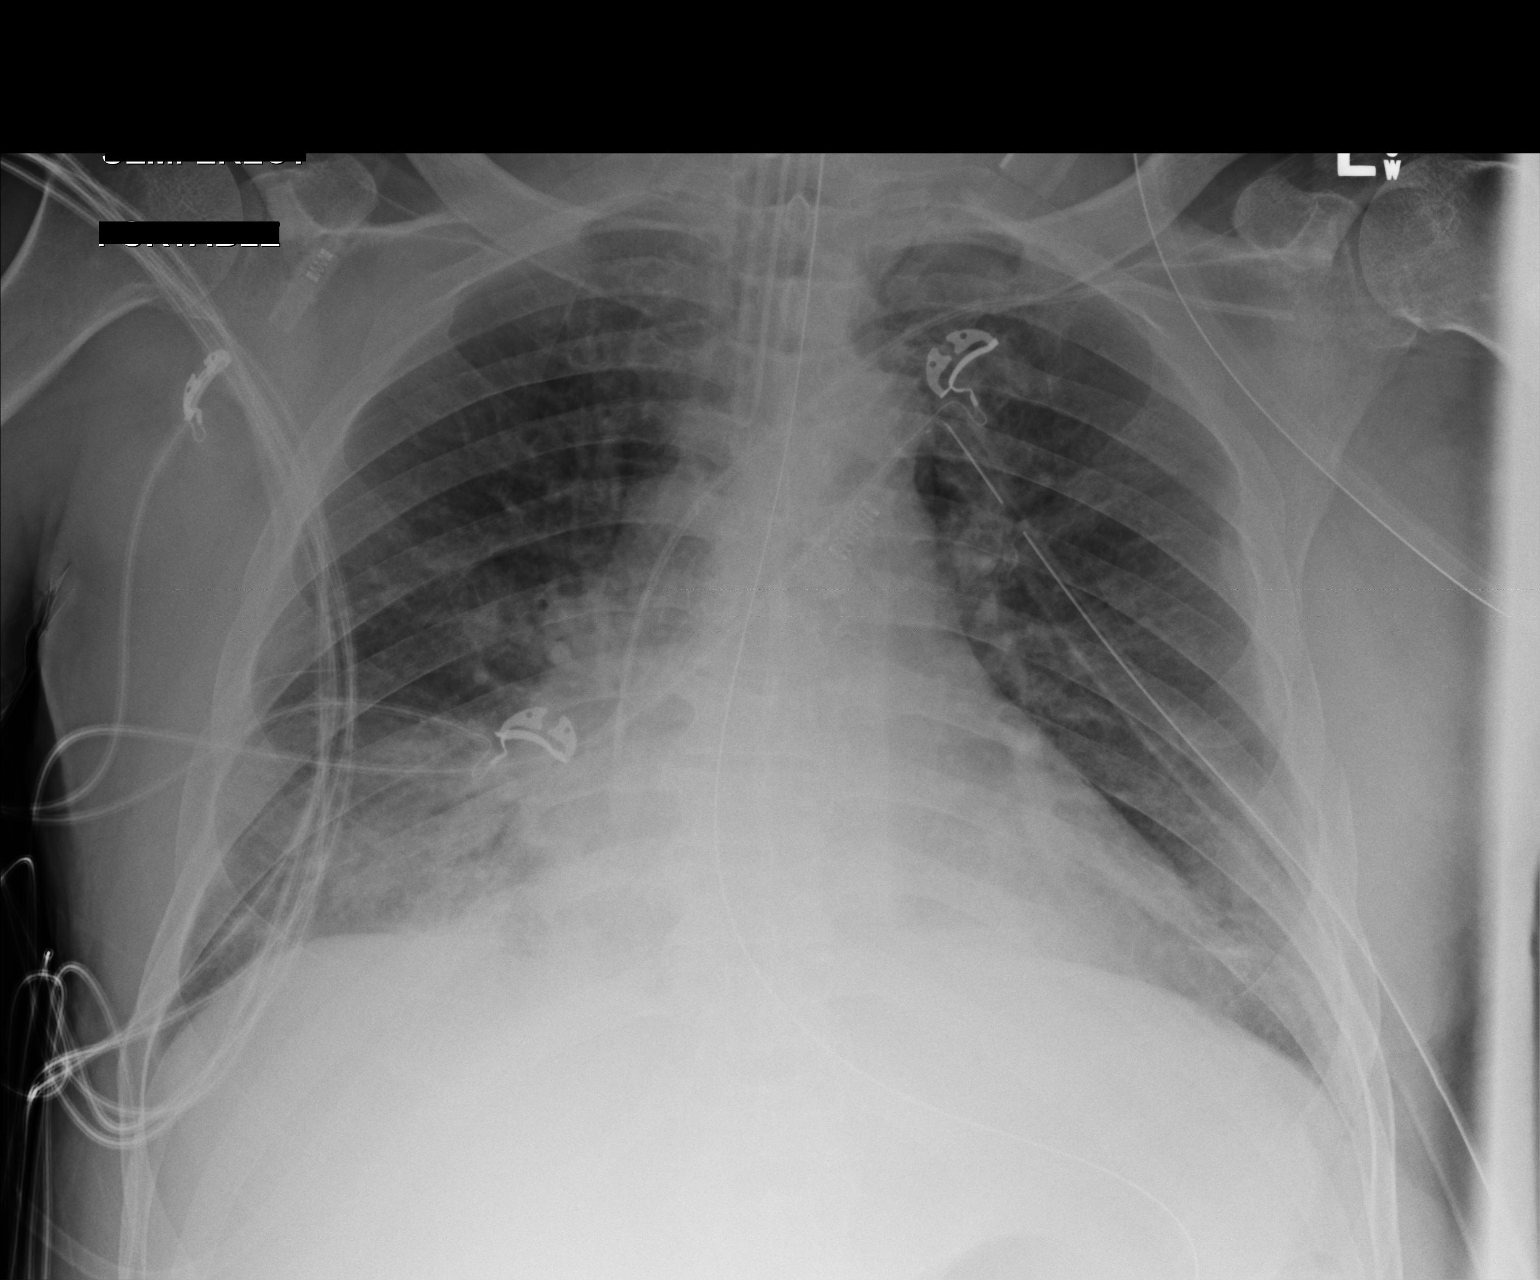

[1 of 1 positions shown; findings below may reference images not displayed]

FINDINGS: Left chest tube remains in place without appreciable pneumothorax.
Left lateral rib deformities noted. The remainder of the support
apparatus appear stable.

Slightly improved aeration at the left lung base with stable
airspace opacity at the right lung base. Low lung volumes.
IMPRESSION: 1. Mildly improved aeration at the left lung base with stable
airspace opacity at the right lung base.
2. Stable support apparatus. I do not currently see a definite
pneumothorax. Left rib deformities compatible with fractures.
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
## 2017-07-14 IMAGING — CR DG CHEST 1V PORT
1 series · 1 of 1 positions shown · non-contrast
Comparison: 09/28/2015.

CLINICAL DATA: Respiratory failure.

EXAM:
PORTABLE CHEST 1 VIEW

[AP]
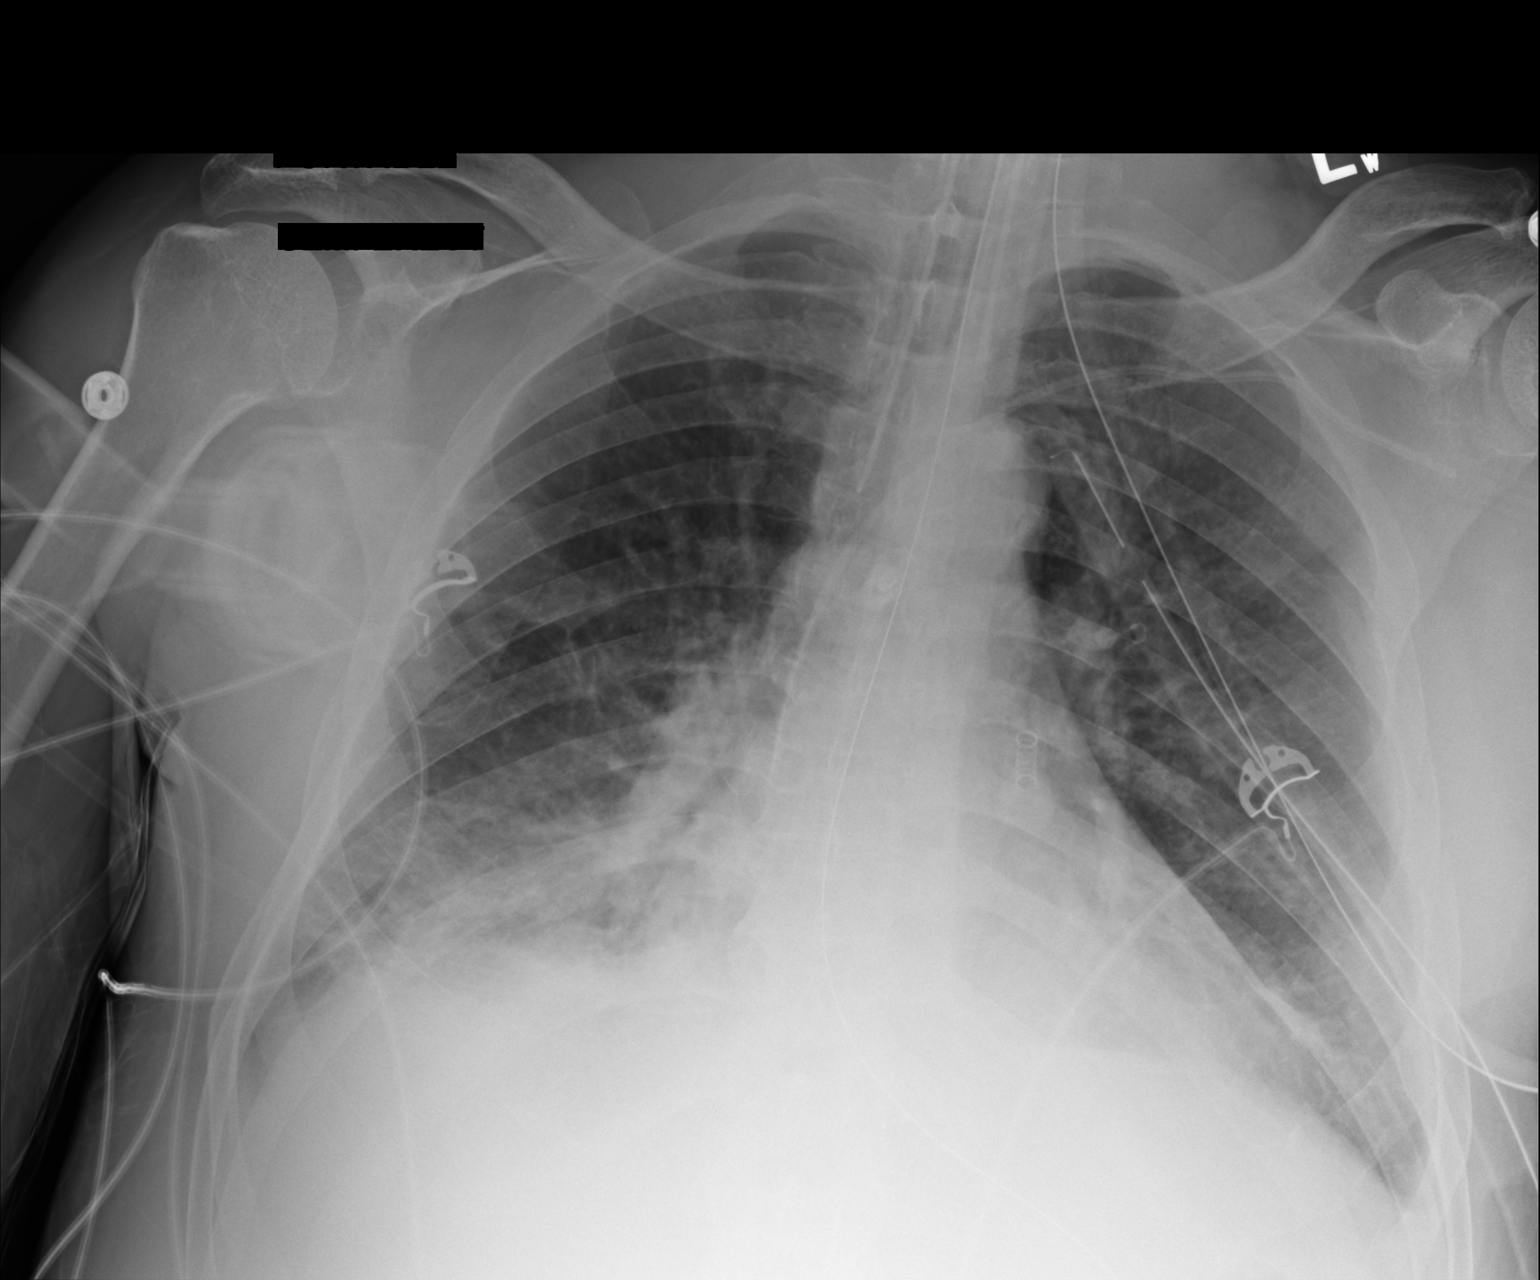

[1 of 1 positions shown; findings below may reference images not displayed]

FINDINGS: Endotracheal tube, NG tube, left subclavian line in stable position.
Left chest tube in stable position. Mediastinum hilar structures are
unremarkable. Low lung volumes with bibasilar atelectasis and/or
mild infiltrates. Small pleural effusions cannot be excluded.
Interim near complete resolution of tiny left apical pneumothorax.
Heart size stable.
IMPRESSION: 1. Left chest tube in stable position. Interim near complete
resolution of tiny left apical pneumothorax.
2. Remaining lines and tubes in stable position.
3. Low lung volumes with bibasilar atelectasis and/or pulmonary
infiltrates noted on today's exam. Small bilateral pleural effusions
cannot be excluded.

## 2017-07-15 IMAGING — CR DG CHEST 1V PORT
1 series · 1 of 1 positions shown · non-contrast
Comparison: Yesterday

CLINICAL DATA: Ventilator dependent respiratory failure

EXAM:
PORTABLE CHEST 1 VIEW

[AP]
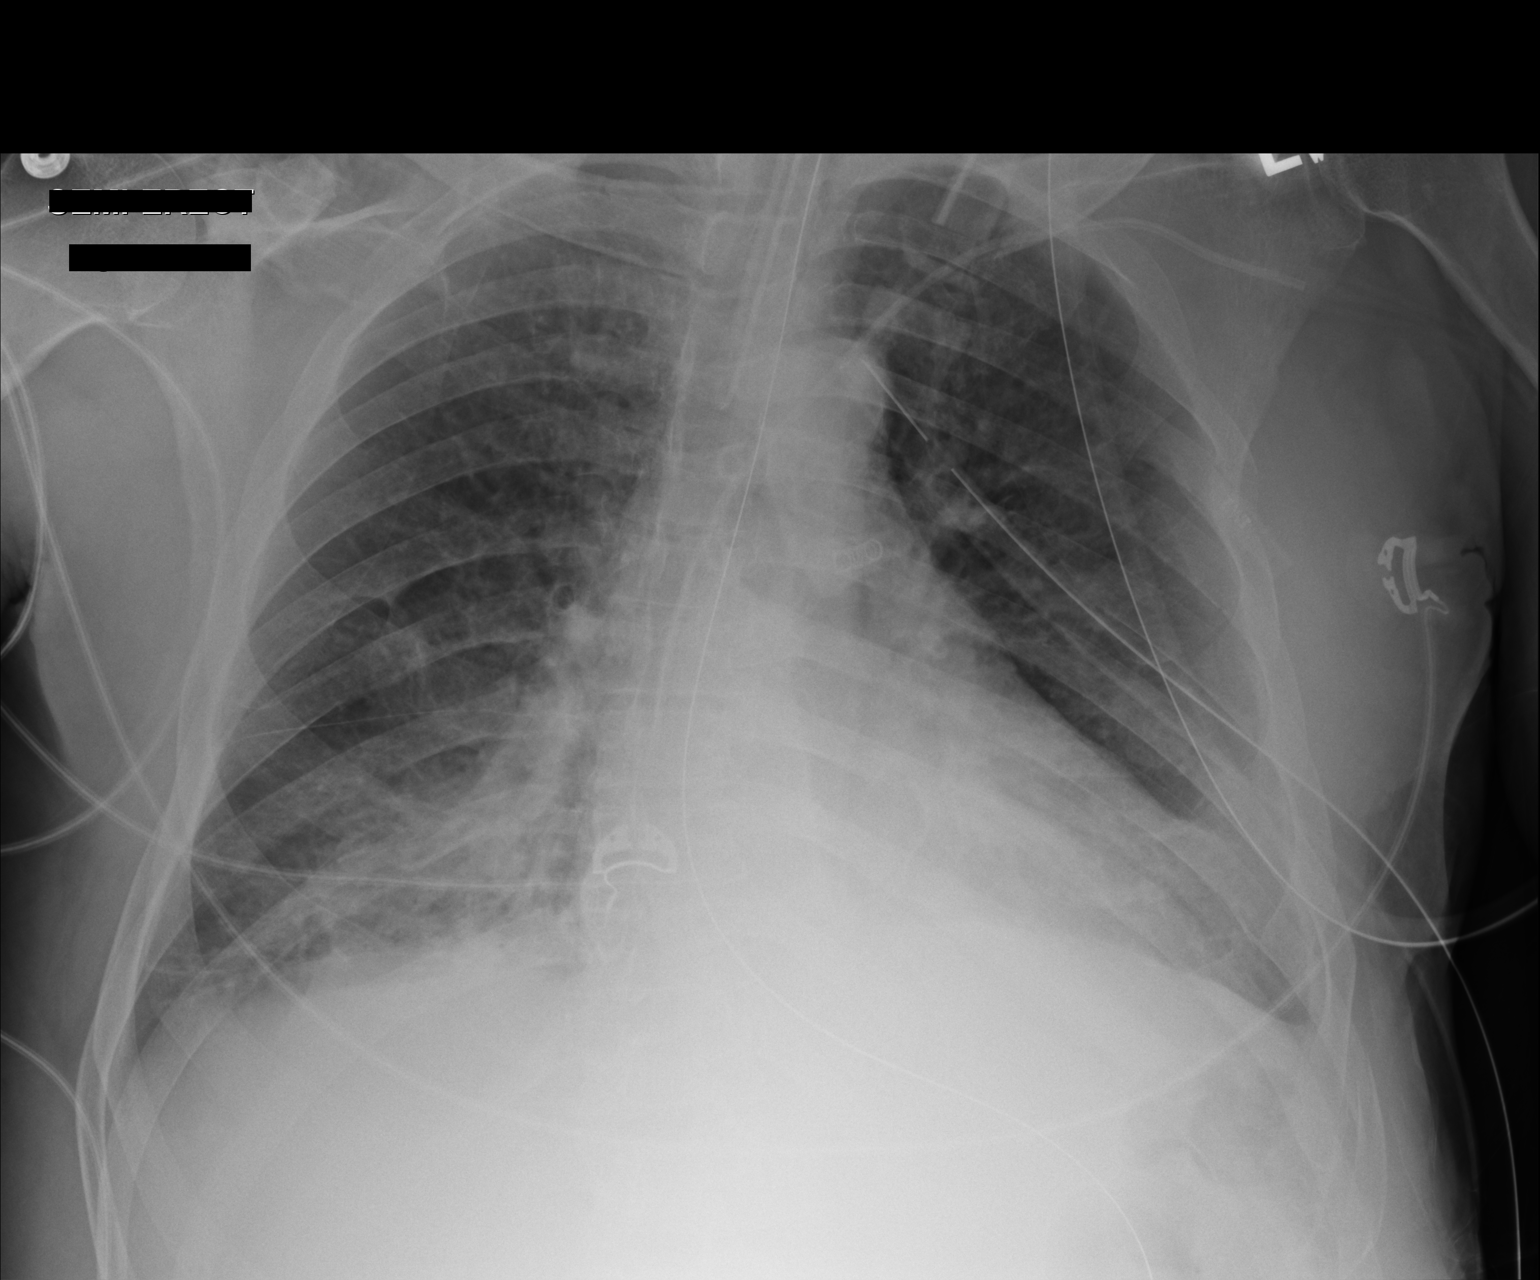

[1 of 1 positions shown; findings below may reference images not displayed]

FINDINGS: Endotracheal tube tip in good position between the clavicular heads
and carina. Stable left chest tube. Orogastric tube continues to the
stomach at least. Left subclavian central line with tip in stable
good position.

Left pneumothorax is no longer visualized. Stable heart size and
mediastinal contours. Airspace disease on the right, suggesting
aspiration or pneumonia based on 09/22/2015 chest CT. No gross
change in left rib fracture alignment.
IMPRESSION: 1. Unchanged positioning of tubes and central line.
2. Left pneumothorax is no longer visualized.
3. Unchanged right basilar airspace disease with persistence and
most recent chest CT suggesting pneumonia.
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
## 2017-07-18 IMAGING — CR DG CHEST 1V PORT
1 series · 1 of 1 positions shown · non-contrast
Comparison: 10/02/2015 .

CLINICAL DATA: Respiratory failure .

EXAM:
PORTABLE CHEST 1 VIEW

[AP]
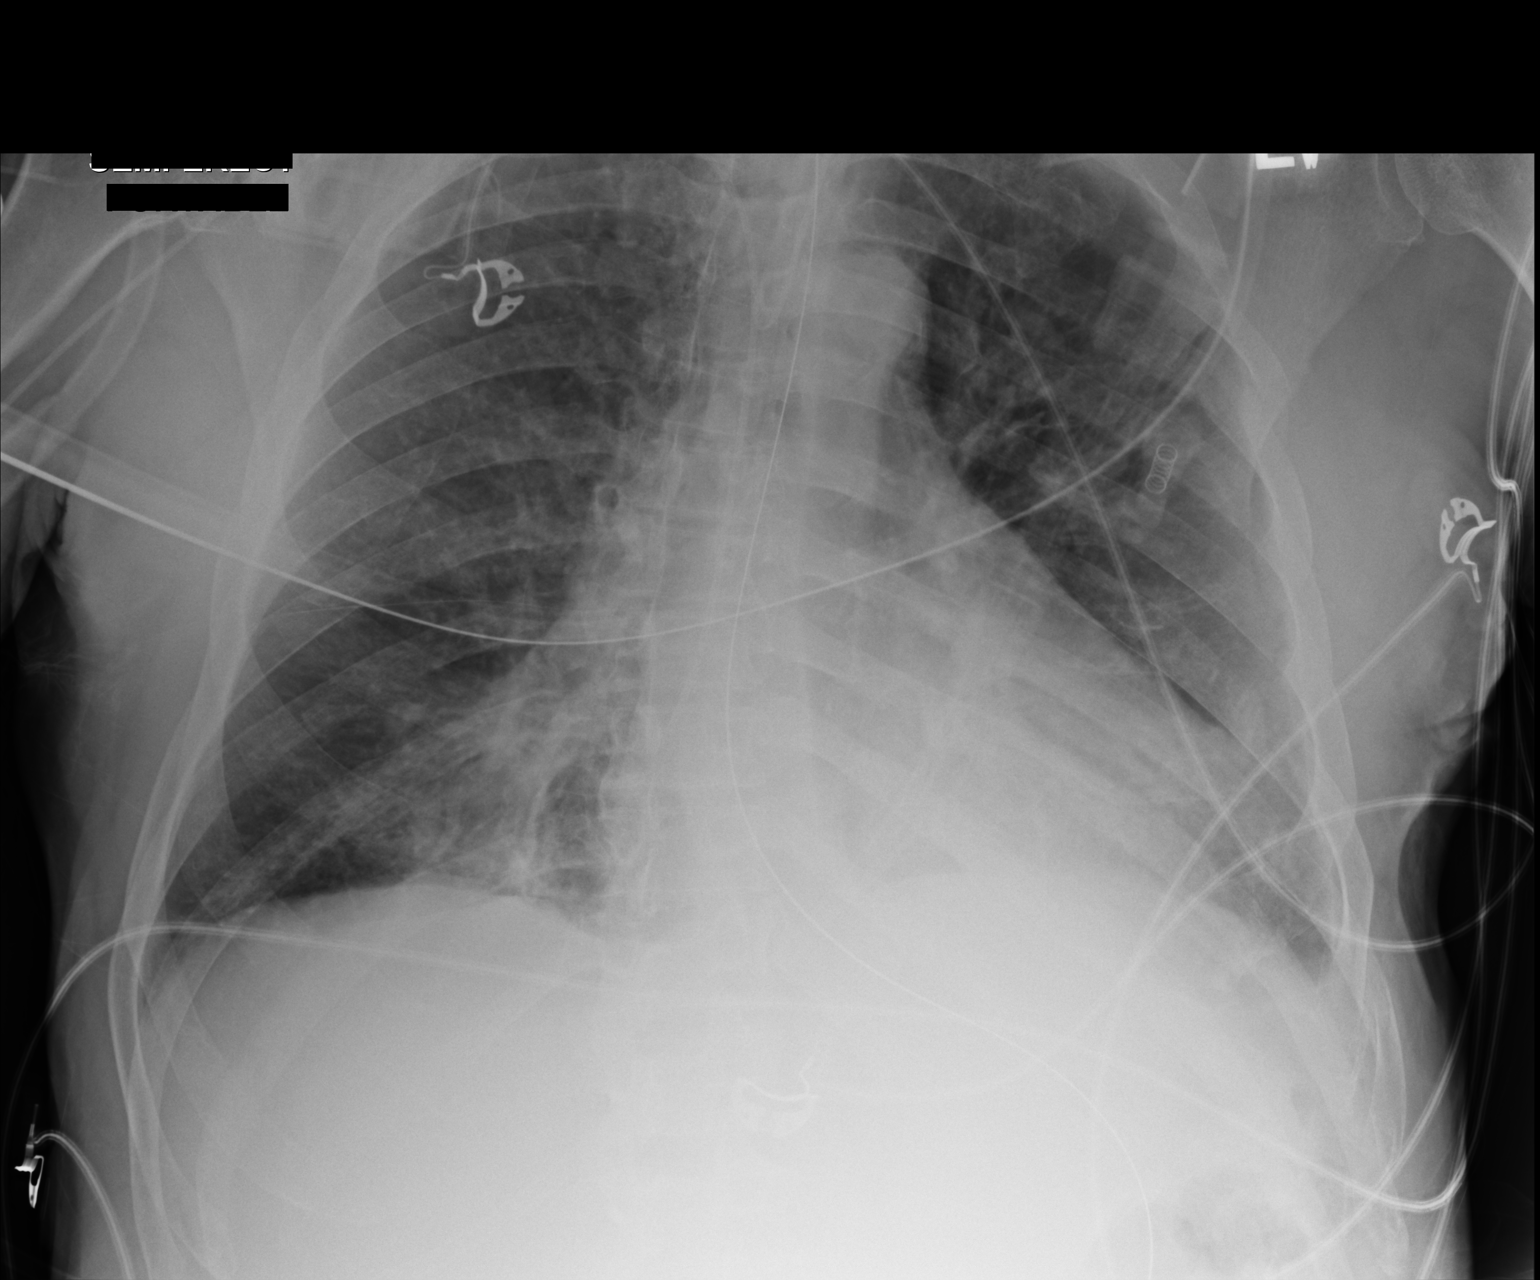

[1 of 1 positions shown; findings below may reference images not displayed]

FINDINGS: Endotracheal tube, NG tube, right PICC line stable position .
Cardiomegaly. Bilateral pulmonary infiltrates are again noted
consistent with bilateral pneumonia and/or pulmonary edema. No
interim change. Low lung volumes with basilar atelectasis . Small
left pleural effusion cannot be excluded. No pneumothorax. No acute
osseous abnormality .
IMPRESSION: 1. Lines and tubes in stable position.
2. Stable cardiomegaly.
3. Persistent bilateral pulmonary infiltrates and/or pulmonary edema
. Low lung volumes with basilar atelectasis. Small left pleural
effusion cannot be excluded. Chest is unchanged from prior exam .

## 2017-07-20 IMAGING — CR DG CHEST 1V PORT
1 series · 1 of 1 positions shown · non-contrast
Comparison: 10/04/2015

CLINICAL DATA: Respiratory failure.  COPD.

EXAM:
PORTABLE CHEST 1 VIEW

[AP]
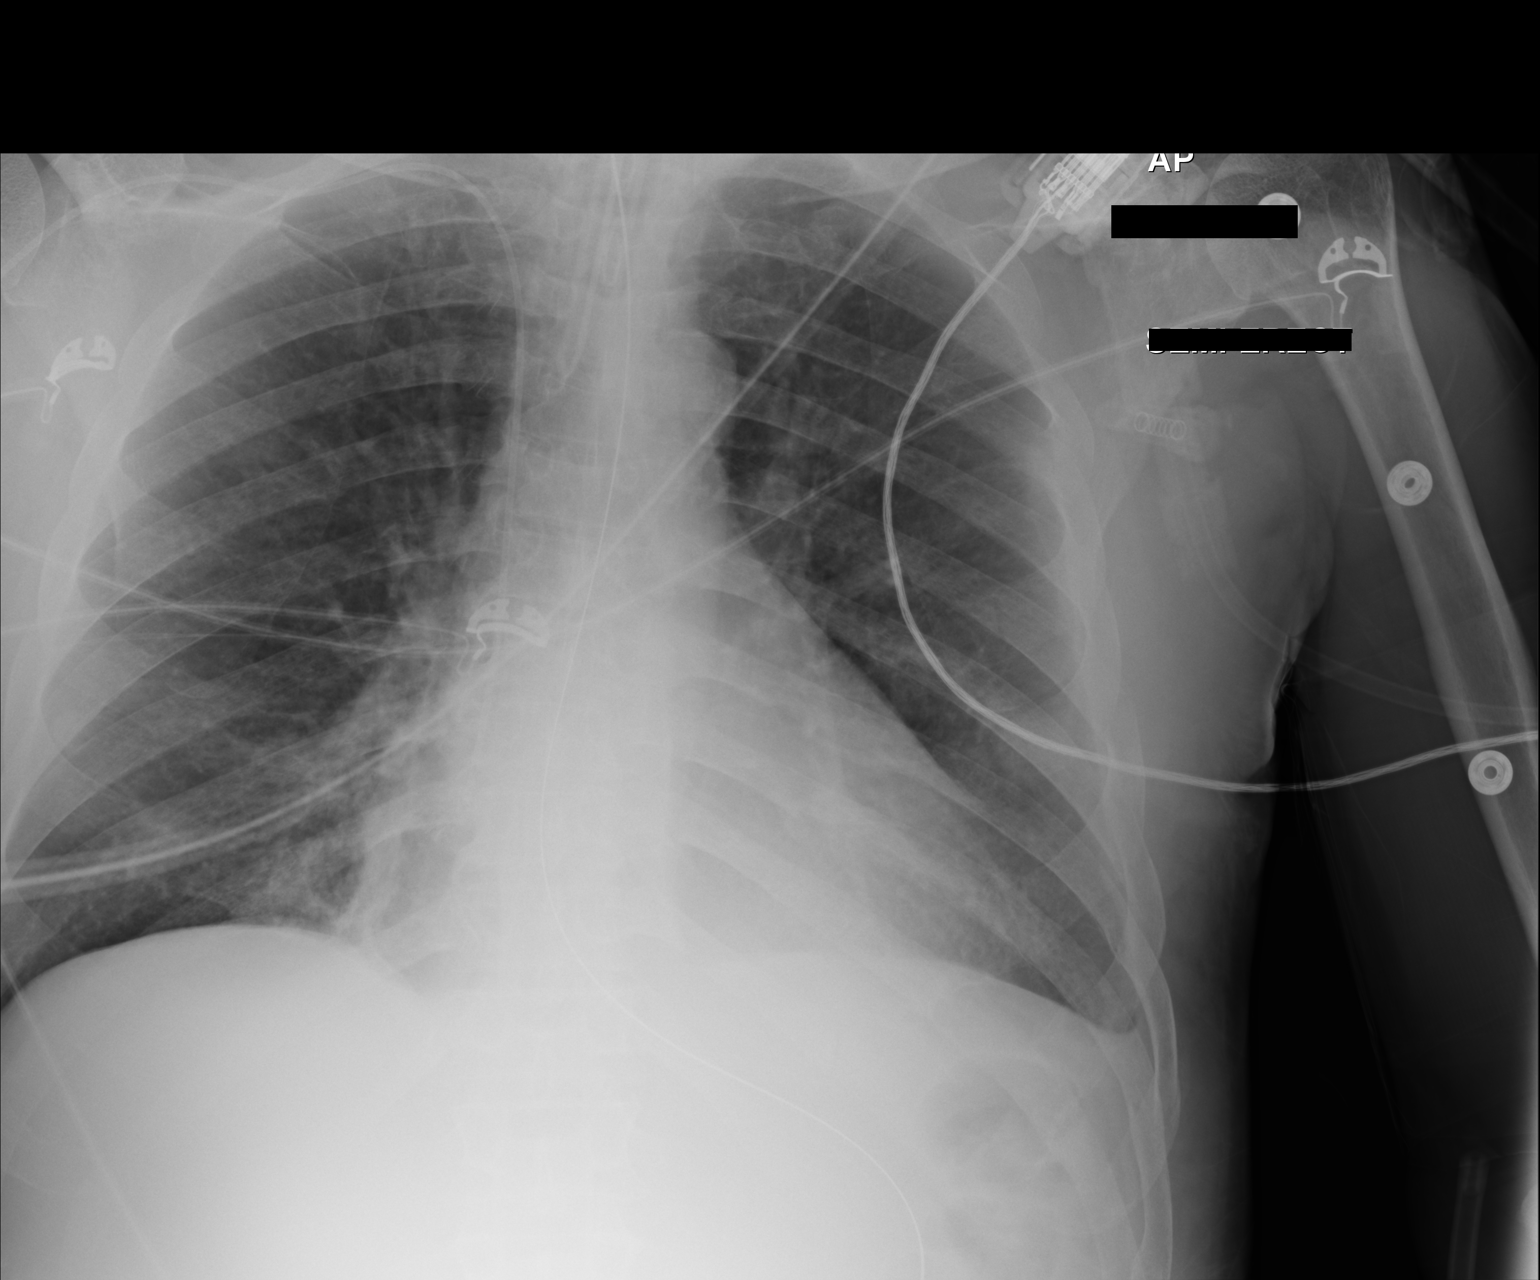

[1 of 1 positions shown; findings below may reference images not displayed]

FINDINGS: Endotracheal tube, enteric catheter, right PICC line are stable in
position.

Cardiomediastinal silhouette is stably enlarged.

There is no evidence of pneumothorax. The aeration of the lungs is
improved with more subtle bibasilar reticulonodular opacities. There
may be a small left pleural effusion.

Osseous structures are without acute abnormality. Soft tissues are
grossly normal.
IMPRESSION: Stably enlarged cardiac silhouette.

Improved aeration of the lungs with residual subtle reticulonodular
bibasilar airspace opacities.

There may be a small left pleural effusion or pleural thickening.

## 2017-07-21 IMAGING — CR DG ABD PORTABLE 1V
1 series · 1 of 1 positions shown · non-contrast
Comparison: CT 09/21/2015

CLINICAL DATA: Confirm OG tube position

EXAM:
PORTABLE ABDOMEN - 1 VIEW

[AP]
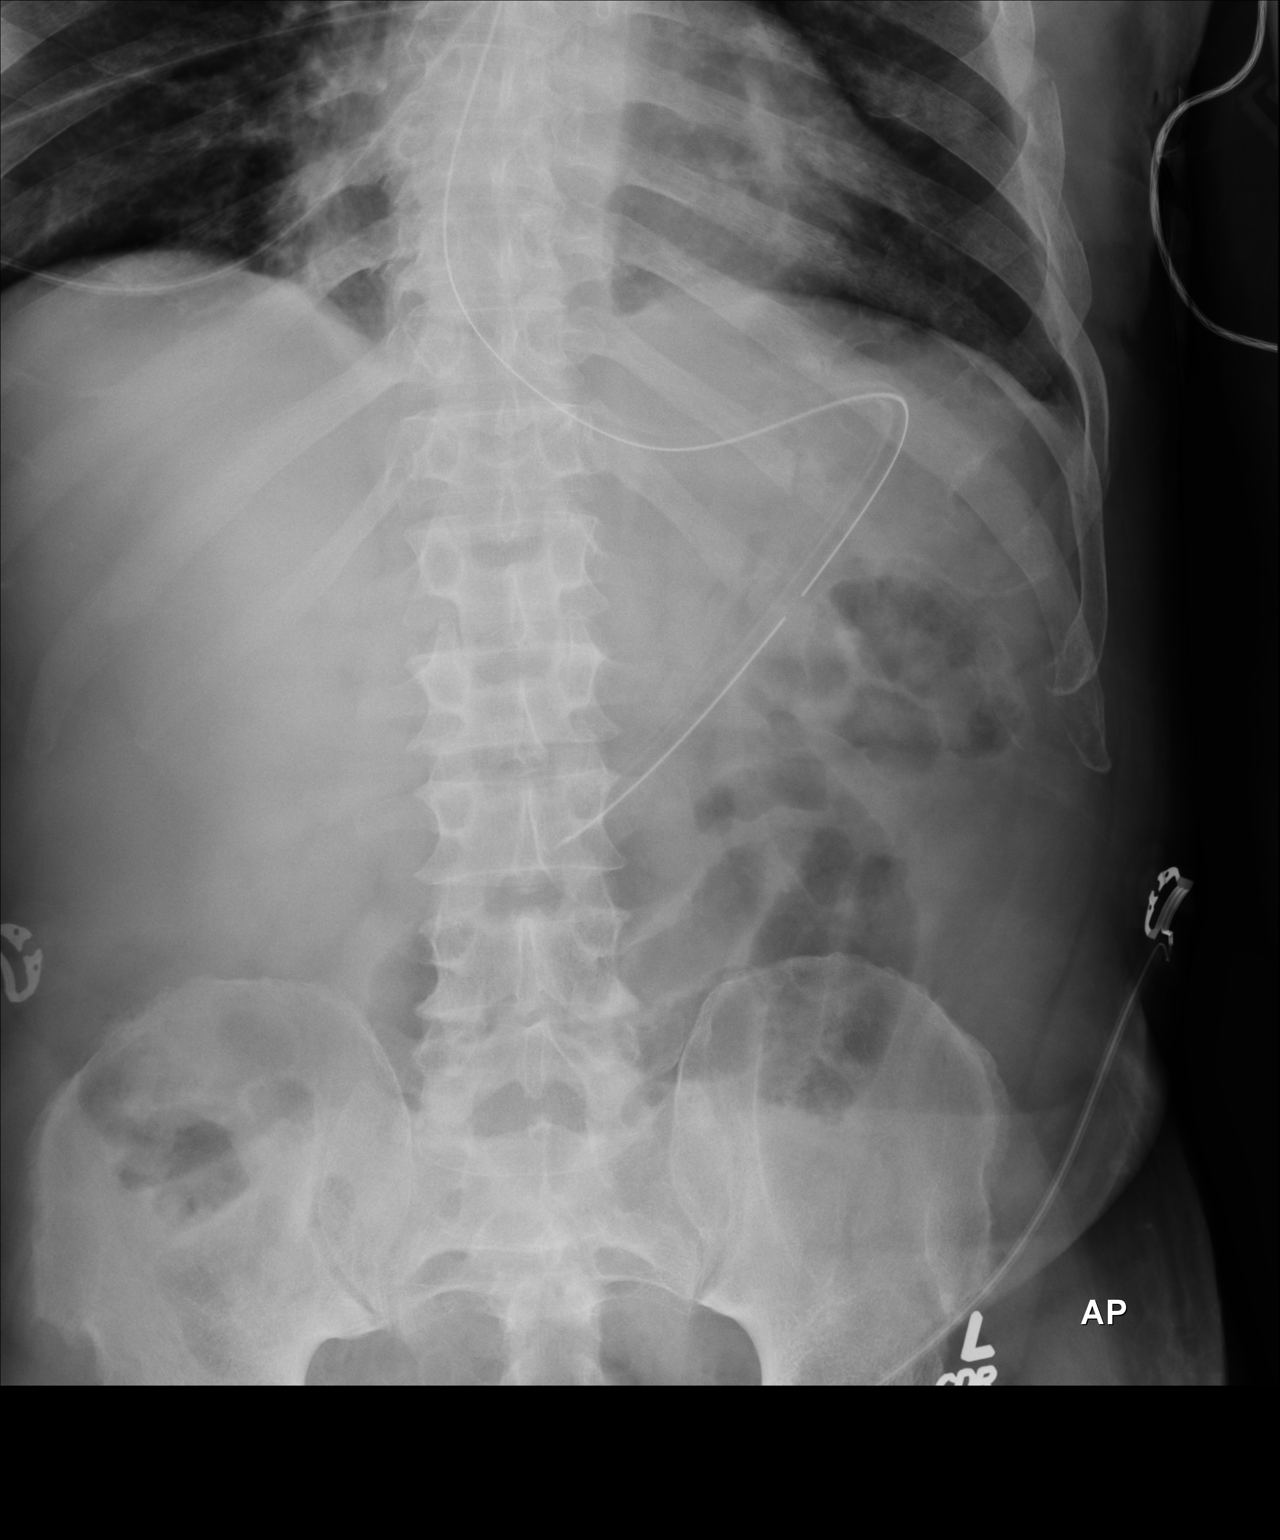

[1 of 1 positions shown; findings below may reference images not displayed]

FINDINGS: NG tube is in place with the tip in the mid to distal stomach. The
side port is in the mid stomach. Mildly prominent mid abdominal
small bowel loops. No free air.
IMPRESSION: NG tube tip in the mid to distal stomach.

## 2017-07-21 IMAGING — CR DG CHEST 1V PORT
1 series · 1 of 1 positions shown · non-contrast
Comparison: 10/06/2015

CLINICAL DATA: Status post re-intubation

EXAM:
PORTABLE CHEST 1 VIEW

[AP]
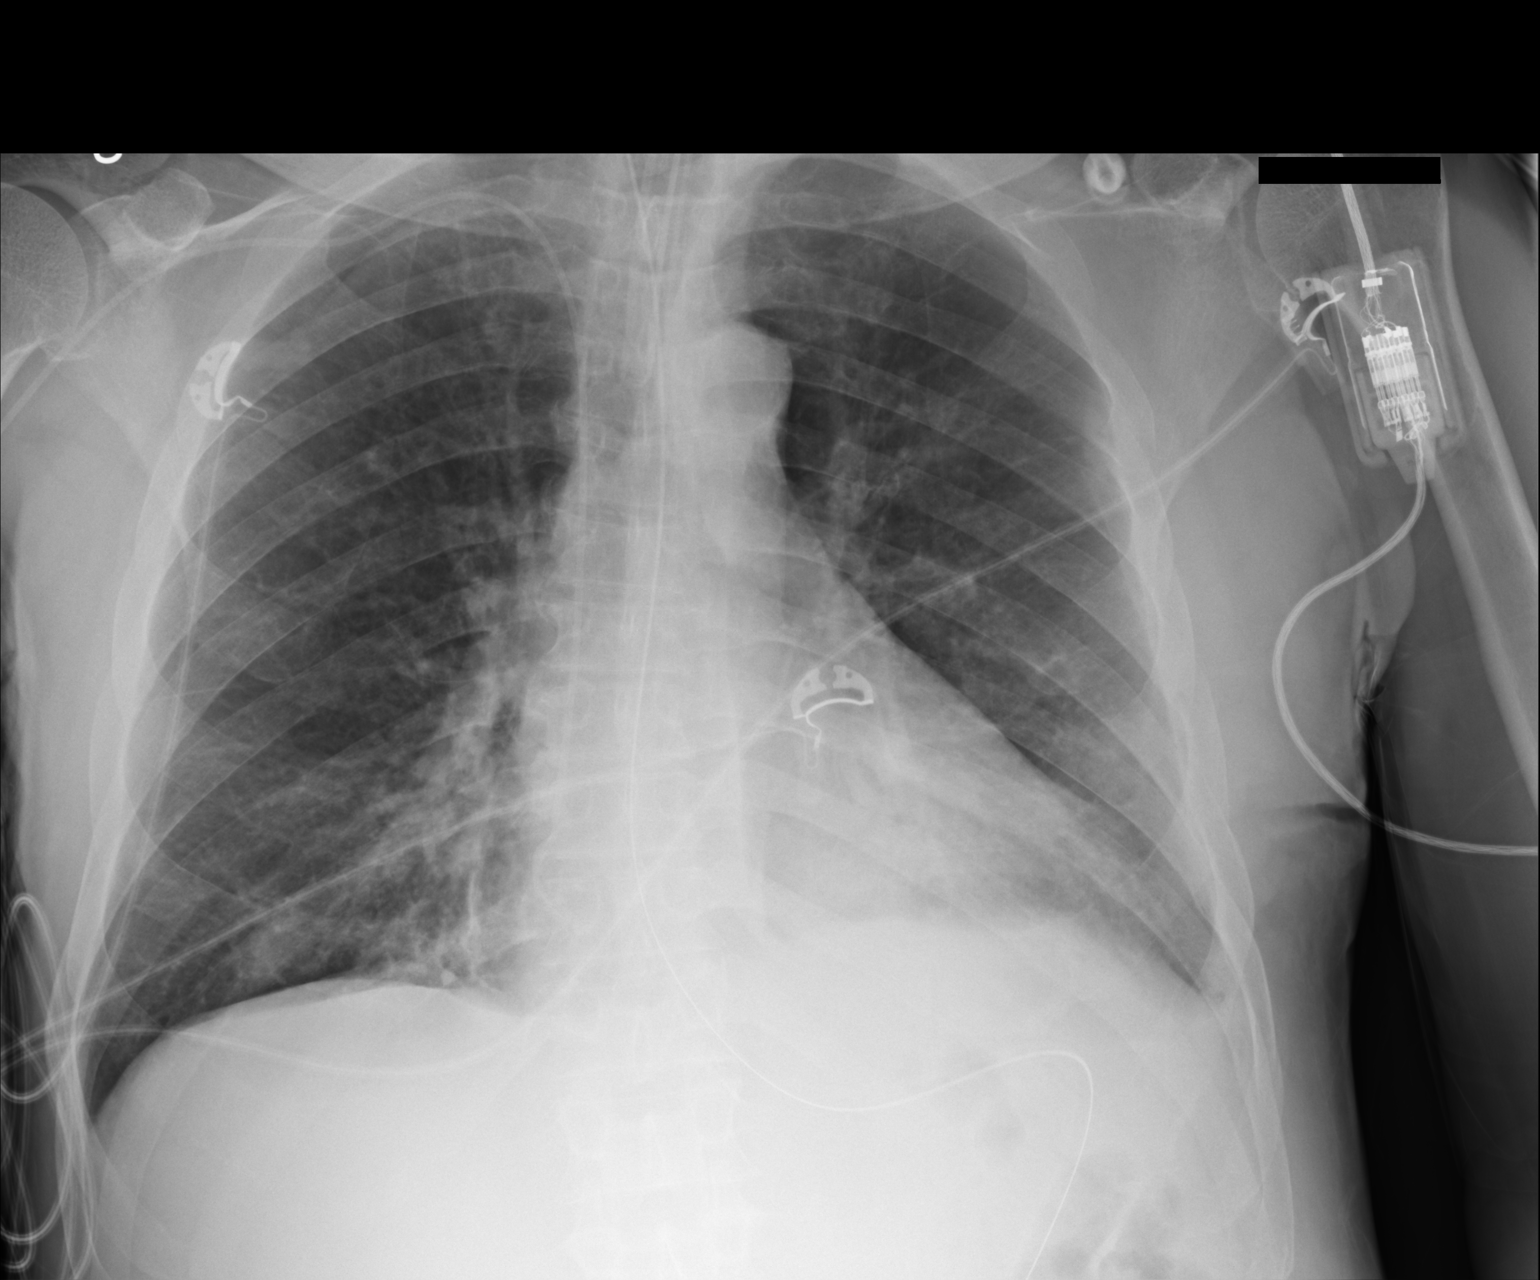

[1 of 1 positions shown; findings below may reference images not displayed]

FINDINGS: Endotracheal tube is 3.5 cm above the carina. Right central line and
NG tube are unchanged. Mild cardiomegaly. Patchy bibasilar
atelectasis or infiltrates, slightly increased. No effusions. No
acute bony abnormality.
IMPRESSION: Endotracheal tube 3.5 cm above the carina.

Patchy bibasilar atelectasis or infiltrates, slightly increased.

## 2017-07-23 IMAGING — CR DG CHEST 1V PORT
1 series · 1 of 1 positions shown · non-contrast
Comparison: Earlier today.

CLINICAL DATA: Tracheostomy tube placement. Acute respiratory
failure.

EXAM:
PORTABLE CHEST 1 VIEW

[AP]
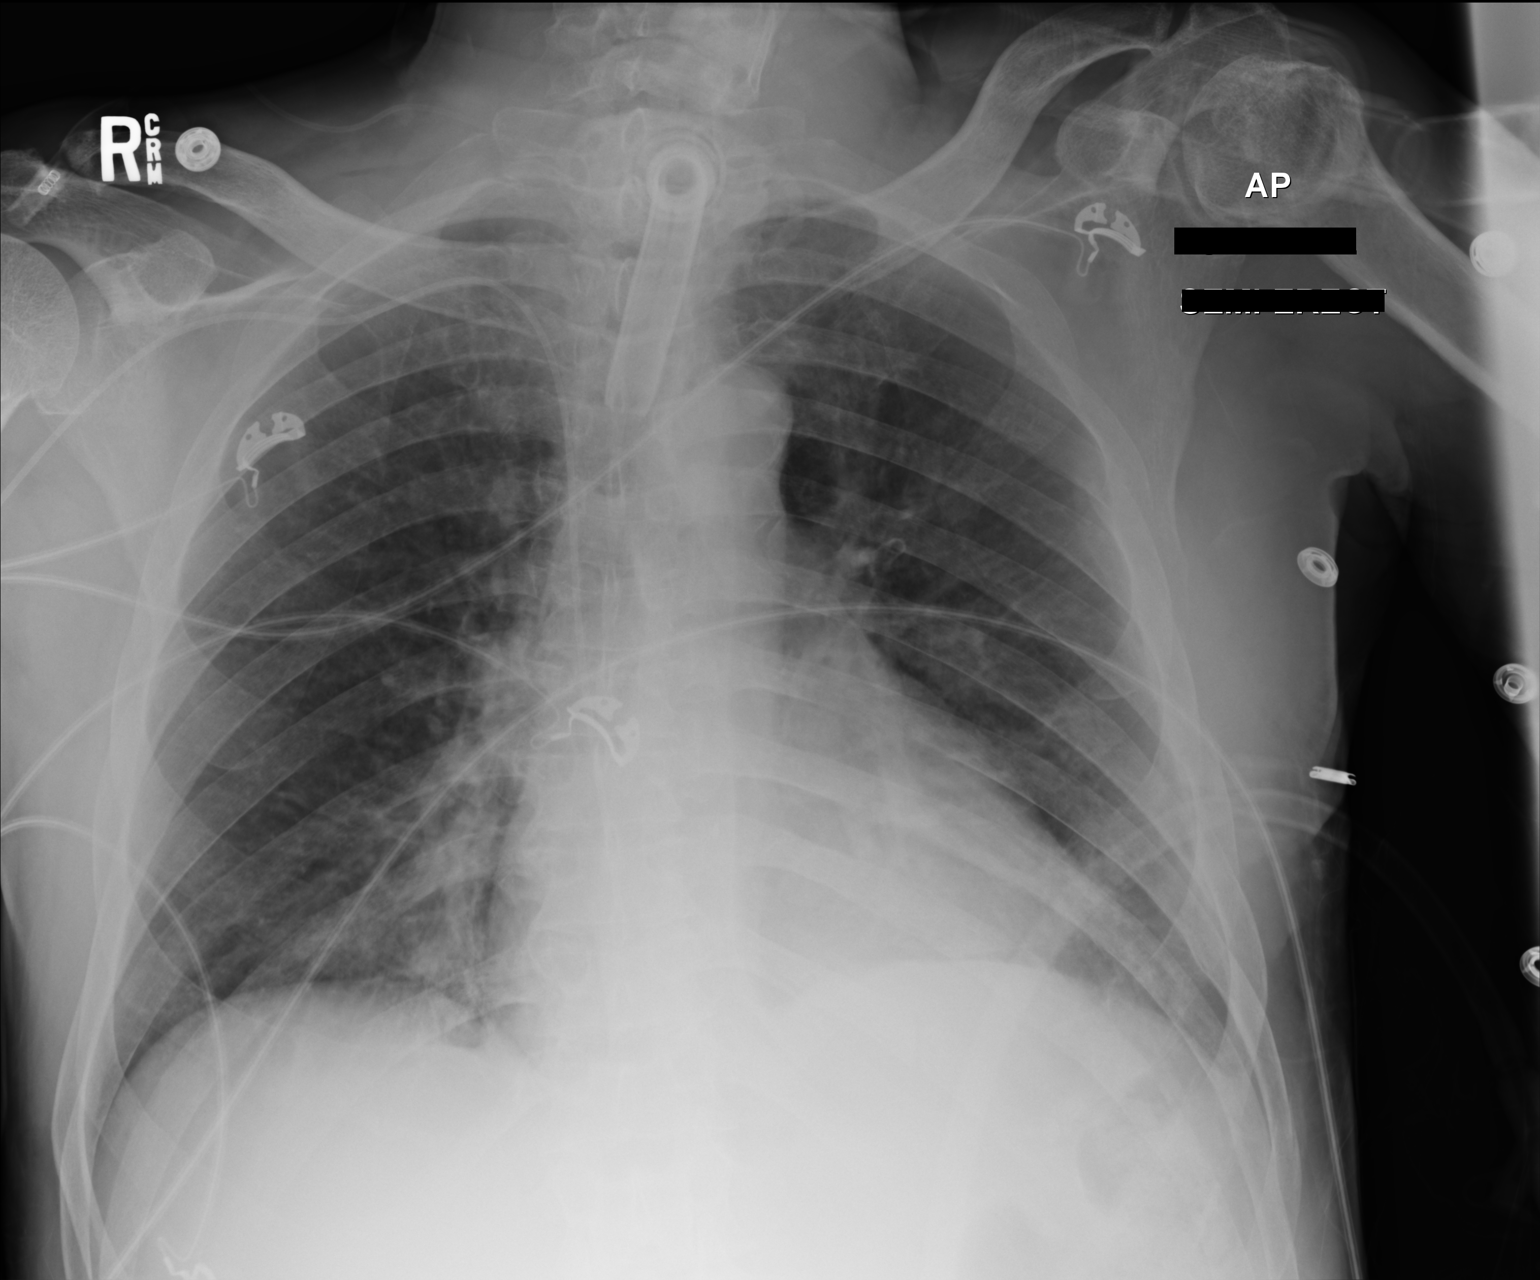

[1 of 1 positions shown; findings below may reference images not displayed]

FINDINGS: The aunts tracheal tube has been replaced with a tracheostomy tube
in satisfactory position. The right PICC tip is in the mid to upper
right atrium. No significant change in bibasilar opacities. The
cardiac silhouette remains mildly enlarged. Unremarkable bones.
IMPRESSION: 1. Tracheostomy tube in satisfactory position.
2. Stable bibasilar atelectasis and possible pneumonia.
3. Stable cardiomegaly.

## 2017-07-23 IMAGING — CR DG CHEST 1V PORT
1 series · 1 of 1 positions shown · non-contrast
Comparison: Portable chest x-ray October 07, 2015

CLINICAL DATA: Respiratory failure, history of COPD, current
smoker, intubated.

EXAM:
PORTABLE CHEST 1 VIEW

[AP]
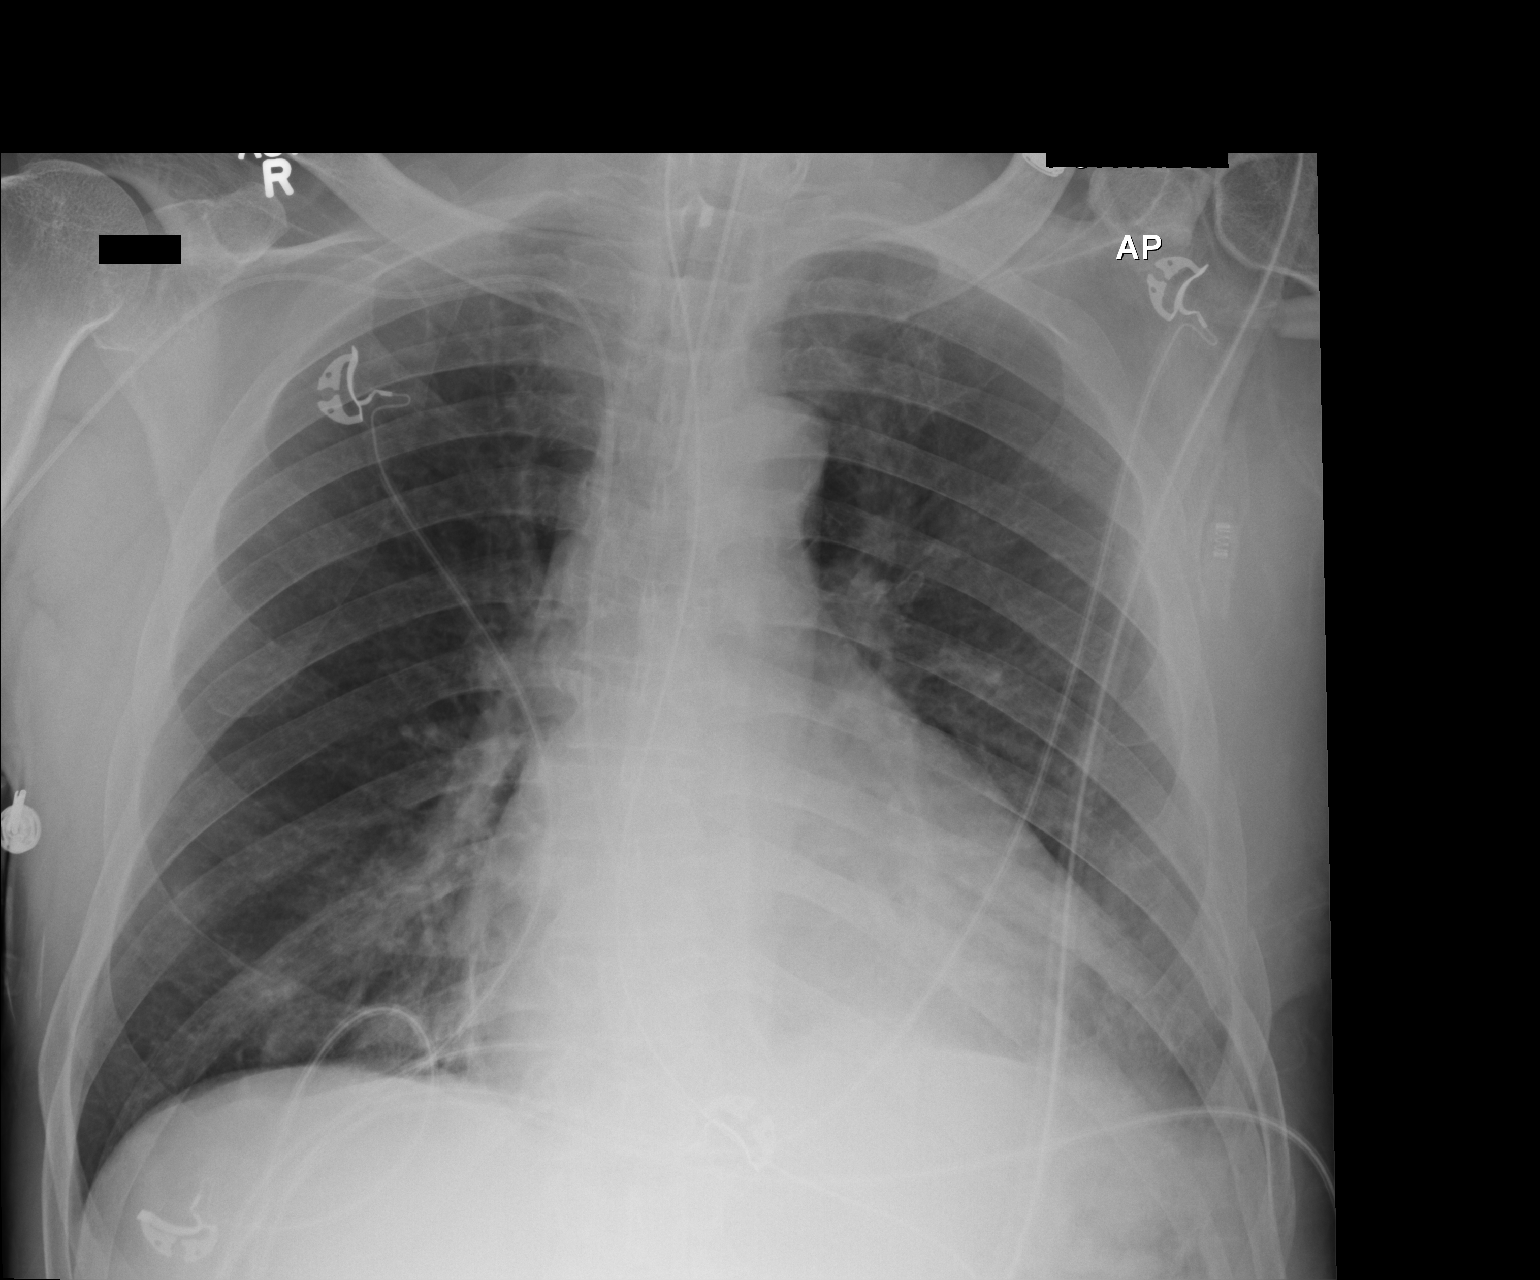

[1 of 1 positions shown; findings below may reference images not displayed]

FINDINGS: The lungs are adequately inflated. Persistent patchy density is
present especially on the left. There is a small left pleural
effusion. Coarse infrahilar lung markings on the right are slightly
less conspicuous today. The cardiac silhouette remains enlarged. The
pulmonary vascularity is not significantly engorged. The
endotracheal tube tip lies 3.2 cm above the carina. The
esophagogastric tube tip projects below the inferior margin of the
image. The right-sided PICC line tip projects just above the
cavoatrial junction.
IMPRESSION: Stable to slightly decreased conspicuity of left lower lobe
atelectasis or pneumonia. Stable small left pleural effusion.
Improvement in the right infrahilar subsegmental atelectasis. The
support tubes are in stable position.

## 2017-07-25 IMAGING — CR DG CHEST 1V PORT
1 series · 1 of 1 positions shown · non-contrast
Comparison: Portable chest x-ray October 09, 2015

CLINICAL DATA: Respiratory failure, multiple rib fractures,
tracheostomy patient.

EXAM:
PORTABLE CHEST 1 VIEW

[AP]
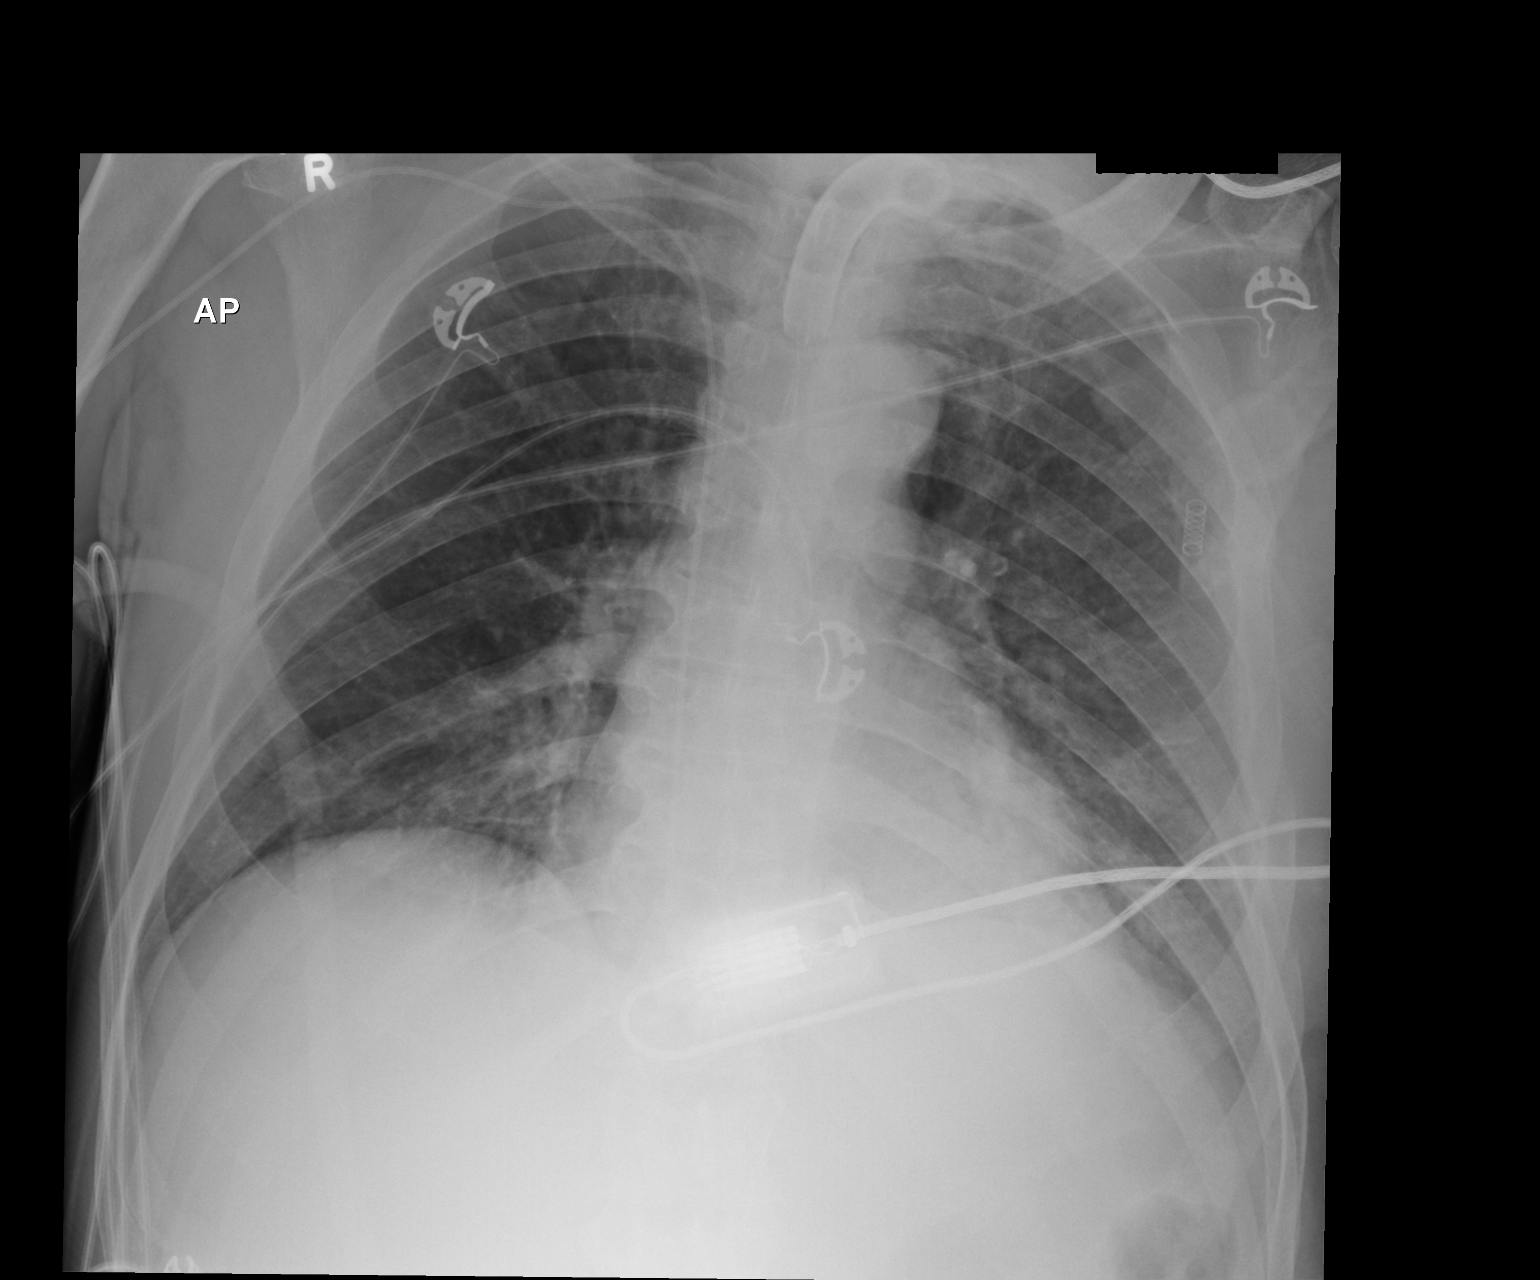

[1 of 1 positions shown; findings below may reference images not displayed]

FINDINGS: The lungs are adequately inflated. The interstitial markings remain
increased in the retrocardiac region on the left and in the right
infrahilar region. The cardiac silhouette is normal in size. The
pulmonary vascularity is not engorged. The tracheostomy appliance
tip projects at the level of the inferior margin of the clavicular
heads. The right-sided PICC line tip projects at the cavoatrial
junction.
IMPRESSION: Slight interval improvement in bibasilar atelectasis or pneumonia.
There is no significant pleural effusion, and there is no
pneumothorax. Known left lateral rib fractures are partially imaged
on today's study.
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
## 2017-07-29 ENCOUNTER — Other Ambulatory Visit: Payer: Self-pay | Admitting: Internal Medicine
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
## 2017-08-02 ENCOUNTER — Encounter (HOSPITAL_COMMUNITY): Payer: Self-pay

## 2017-08-02 ENCOUNTER — Other Ambulatory Visit: Payer: Self-pay

## 2017-08-02 ENCOUNTER — Emergency Department (HOSPITAL_COMMUNITY)
Admission: EM | Admit: 2017-08-02 | Discharge: 2017-08-02 | Disposition: A | Discharge status: Home / Self Care | Payer: Medicare Other | Attending: Emergency Medicine | Admitting: Emergency Medicine

## 2017-08-02 ENCOUNTER — Emergency Department (HOSPITAL_COMMUNITY): Payer: Medicare Other

## 2017-08-02 DIAGNOSIS — I10 Essential (primary) hypertension: Secondary | ICD-10-CM | POA: Diagnosis not present

## 2017-08-02 DIAGNOSIS — J209 Acute bronchitis, unspecified: Secondary | ICD-10-CM | POA: Insufficient documentation

## 2017-08-02 DIAGNOSIS — J449 Chronic obstructive pulmonary disease, unspecified: Secondary | ICD-10-CM | POA: Insufficient documentation

## 2017-08-02 DIAGNOSIS — Z79899 Other long term (current) drug therapy: Secondary | ICD-10-CM | POA: Diagnosis not present

## 2017-08-02 DIAGNOSIS — R0989 Other specified symptoms and signs involving the circulatory and respiratory systems: Secondary | ICD-10-CM | POA: Diagnosis present

## 2017-08-02 DIAGNOSIS — J329 Chronic sinusitis, unspecified: Secondary | ICD-10-CM

## 2017-08-02 DIAGNOSIS — J019 Acute sinusitis, unspecified: Secondary | ICD-10-CM | POA: Diagnosis not present

## 2017-08-02 DIAGNOSIS — Z7982 Long term (current) use of aspirin: Secondary | ICD-10-CM | POA: Insufficient documentation

## 2017-08-02 MED ORDER — AMOXICILLIN-POT CLAVULANATE 875-125 MG PO TABS
1.0000 | ORAL_TABLET | Freq: Two times a day (BID) | ORAL | 0 refills | Status: DC
Start: 2017-08-02 — End: 2017-12-14

## 2017-08-02 MED ORDER — PREDNISONE 20 MG PO TABS: 20.0000 mg | ORAL_TABLET | Freq: Two times a day (BID) | ORAL | 0 refills | Status: DC

## 2017-08-02 MED ORDER — ALBUTEROL SULFATE (2.5 MG/3ML) 0.083% IN NEBU
5.0000 mg | INHALATION_SOLUTION | Freq: Once | RESPIRATORY_TRACT | Status: AC
  Administered 2017-08-02: 5 mg via RESPIRATORY_TRACT
  Filled 2017-08-02: qty 6

## 2017-08-02 NOTE — Discharge Instructions (Signed)
Continue to use your nebulizer as needed for trouble breathing or coughing.  You can also use Robitussin-DM for cough.  If you have a fever take Tylenol every 4 hours.

## 2017-08-02 NOTE — ED Notes (Signed)
Pt states he was told that he would have wheezing the rest of his life since having trach removed.
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
## 2017-08-02 NOTE — ED Notes (Signed)
Hard copy of EKG was found.  EKG was done at 0923.  Having secretary scan it in to medical record.  It was unable to be exported.

## 2017-08-09 IMAGING — CR DG CHEST 1V PORT
1 series · 1 of 1 positions shown · non-contrast
Comparison: 10/17/2015

CLINICAL DATA: Acute respiratory failure.  COPD.

EXAM:
PORTABLE CHEST 1 VIEW

[AP]
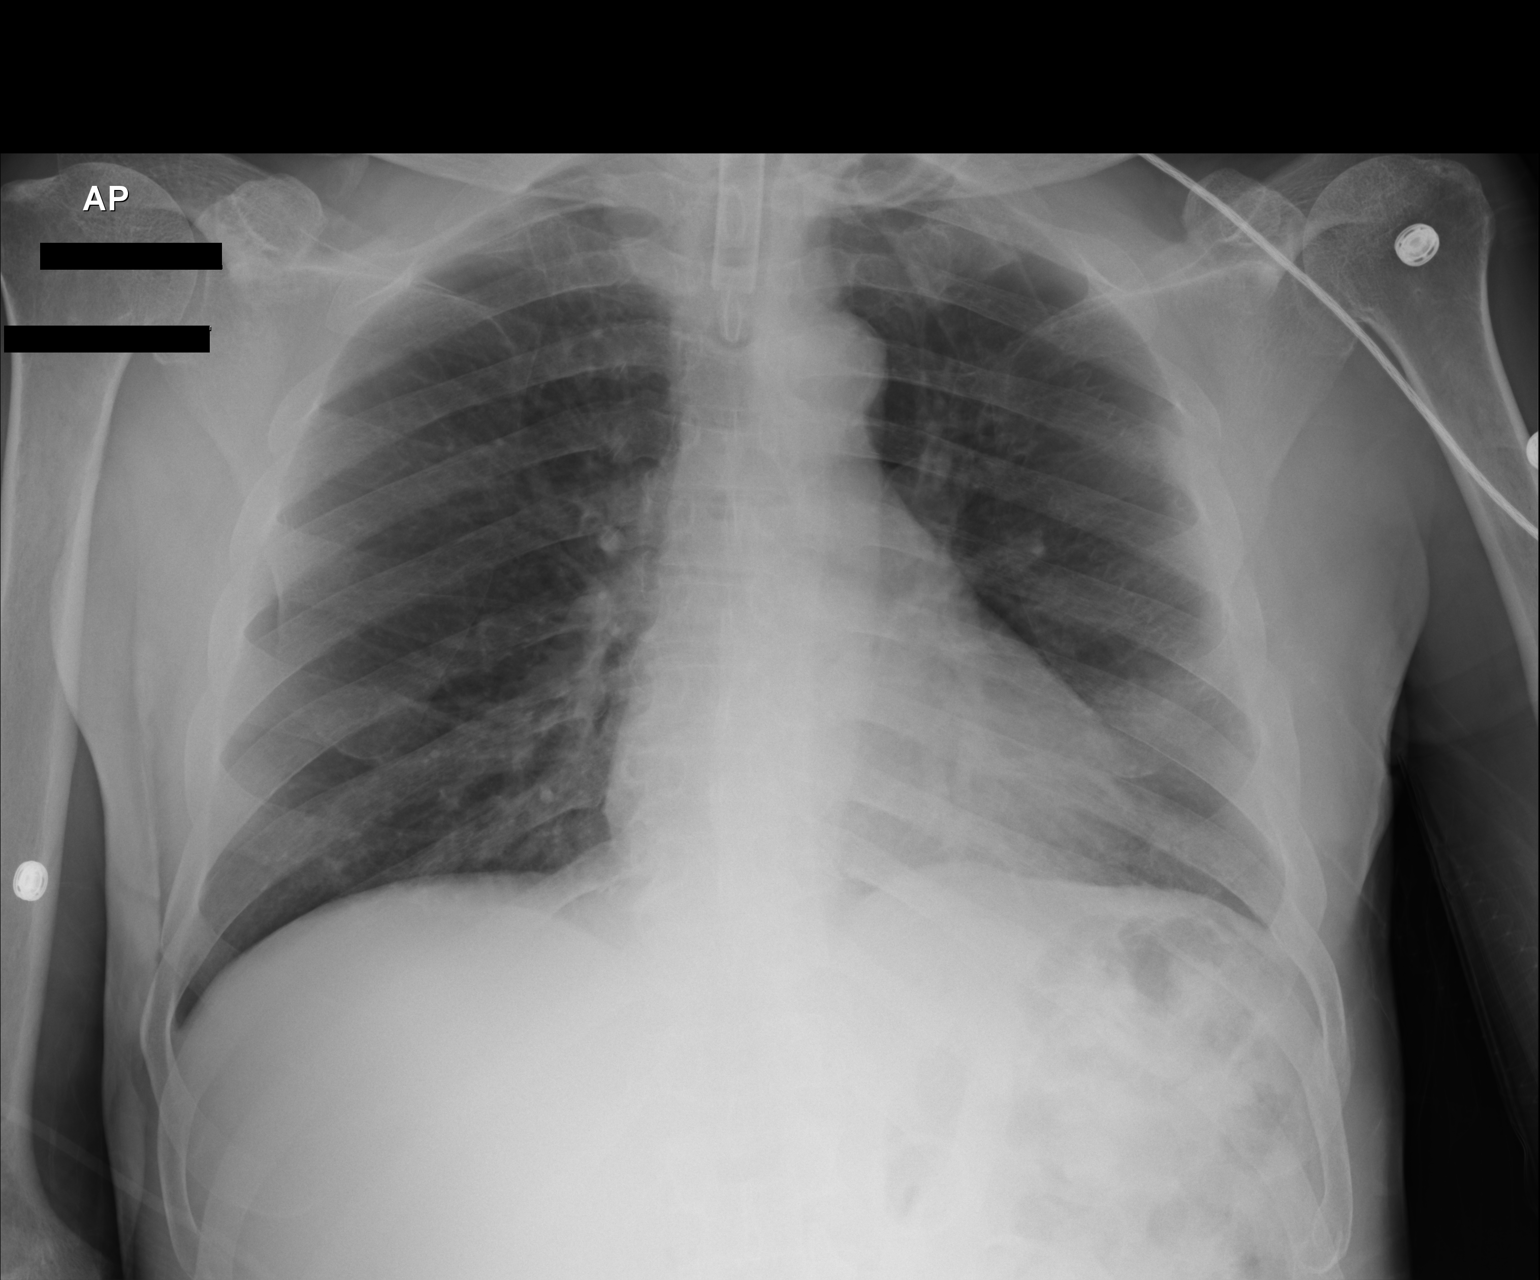

[1 of 1 positions shown; findings below may reference images not displayed]

FINDINGS: Tracheostomy tube is well seated.

Normal heart size and mediastinal contours. No acute infiltrate or
edema. No effusion or pneumothorax. No acute osseous findings.
IMPRESSION: No evidence of active disease.

## 2017-08-13 NOTE — Progress Notes (Signed)
   05/23/17 1504  PT Time Calculation  PT Start Time (ACUTE ONLY) 1347  PT Stop Time (ACUTE ONLY) 1407  PT Time Calculation (min) (ACUTE ONLY) 20 min  PT G-Codes **NOT FOR INPATIENT CLASS**  Functional Assessment Tool Used AM-PAC 6 Clicks Basic Mobility;Clinical judgement  Functional Limitation Mobility: Walking and moving around  Mobility: Walking and Moving Around Current Status (X3818) CK  Mobility: Walking and Moving Around Goal Status (E9937) CI  PT General Charges  $$ ACUTE PT VISIT 1 Visit  PT Evaluation  $PT Eval Low Complexity 1 Low   Weston Anna, MPT [phone]

## 2017-08-19 ENCOUNTER — Other Ambulatory Visit: Payer: Self-pay | Admitting: Internal Medicine
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
## 2017-09-09 IMAGING — DX DG CHEST 1V PORT
1 series · 1 of 1 positions shown · non-contrast
Comparison: Chest radiograph performed 10/26/2015

CLINICAL DATA: Acute onset of shortness of breath and cough.
Initial encounter.

EXAM:
PORTABLE CHEST 1 VIEW

[chest ap]
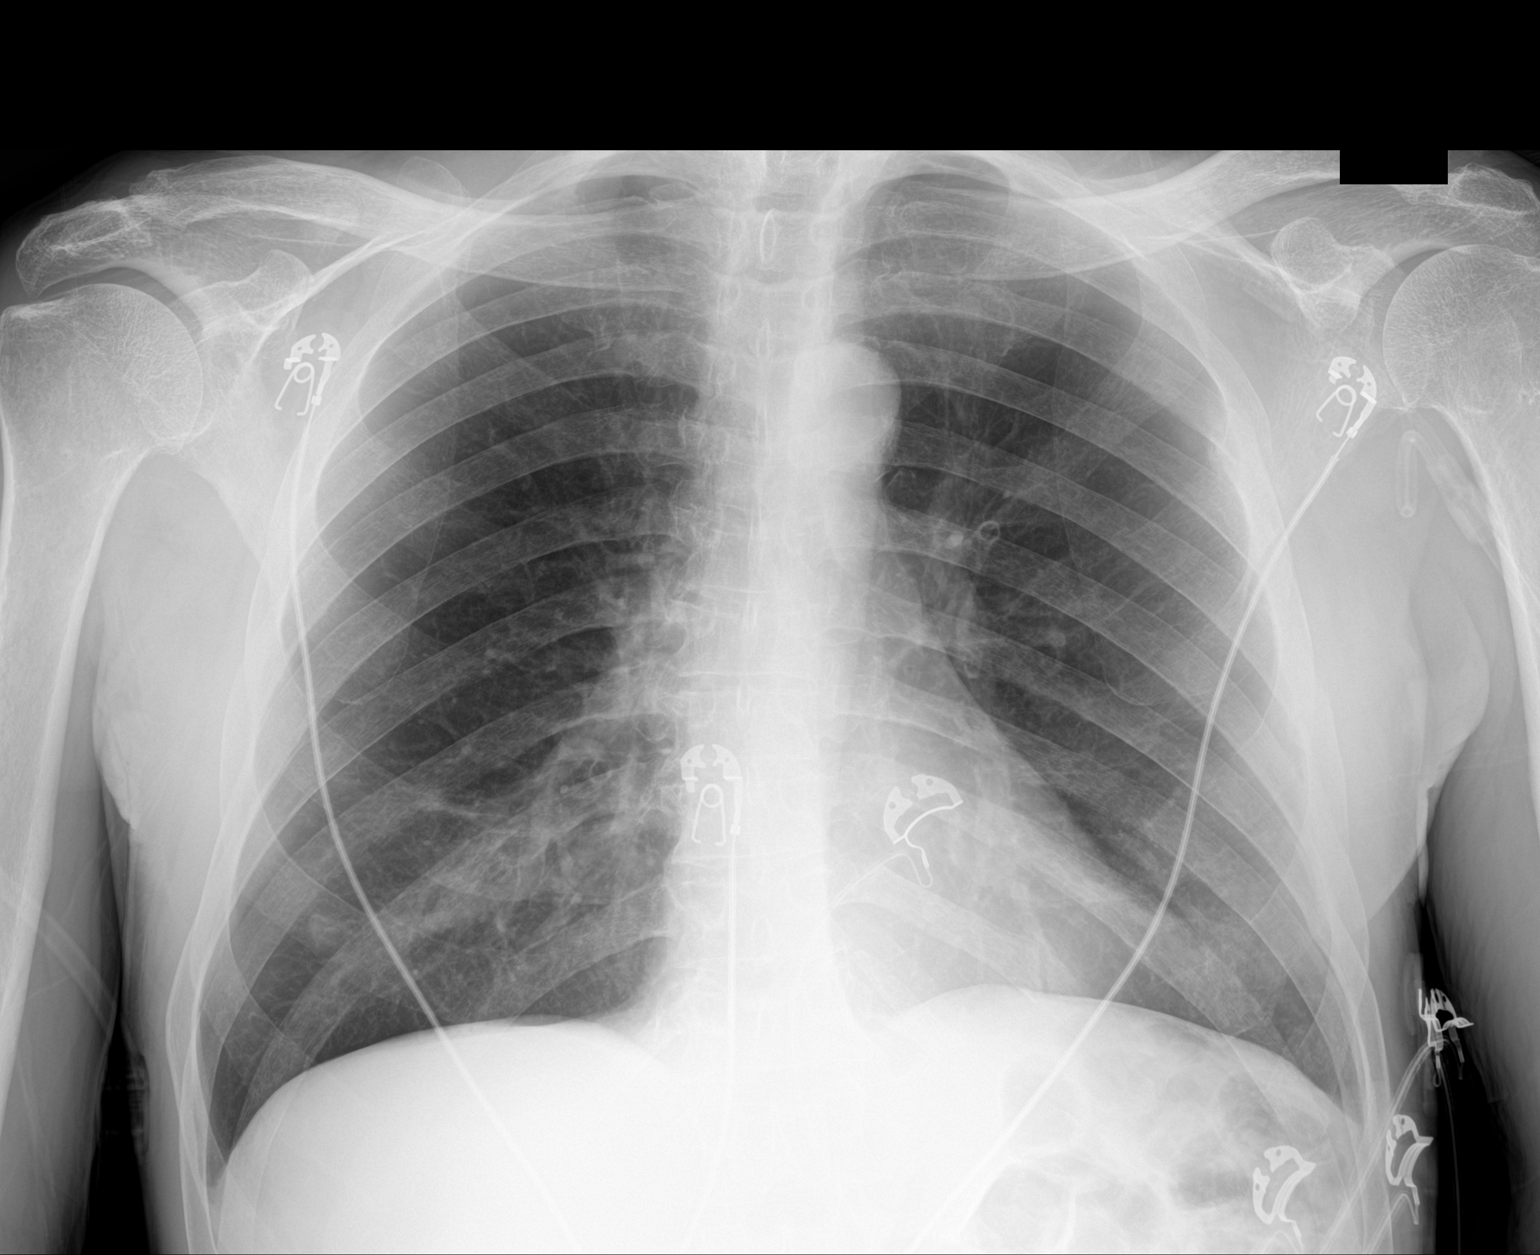

[1 of 1 positions shown; findings below may reference images not displayed]

FINDINGS: The lungs are well-aerated and clear. There is no evidence of focal
opacification, pleural effusion or pneumothorax. Bilateral nipple
shadows are noted.

The heart is normal in size; the mediastinal contour is within
normal limits. No acute osseous abnormalities are seen.
IMPRESSION: No acute cardiopulmonary process seen.

## 2017-10-02 IMAGING — CR DG CHEST 2V
2 series · 2 of 2 positions shown · non-contrast
Comparison: 11/26/2015

CLINICAL DATA: Cough for 2 months

EXAM:
CHEST  2 VIEW

[w chest pa]
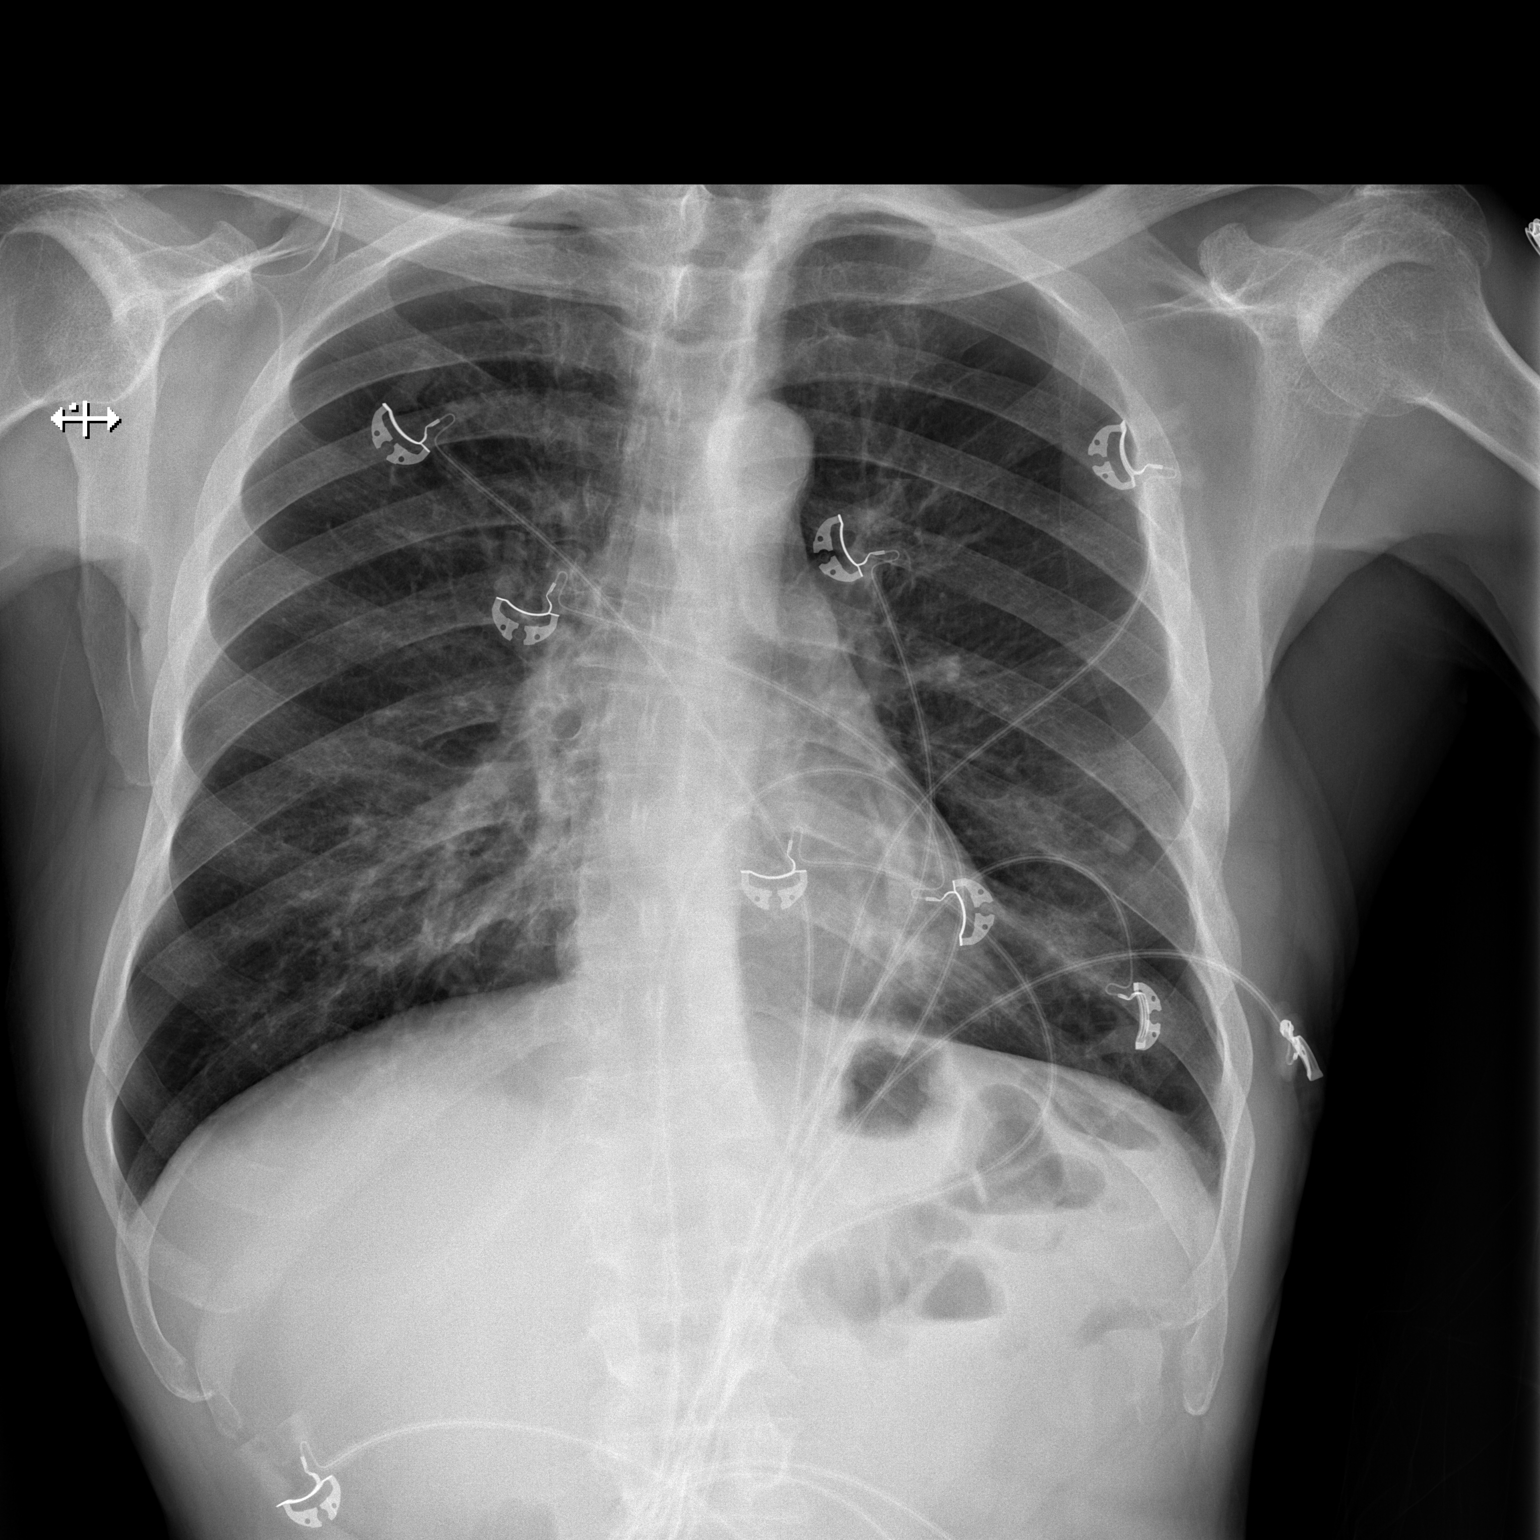

[w chest lat]
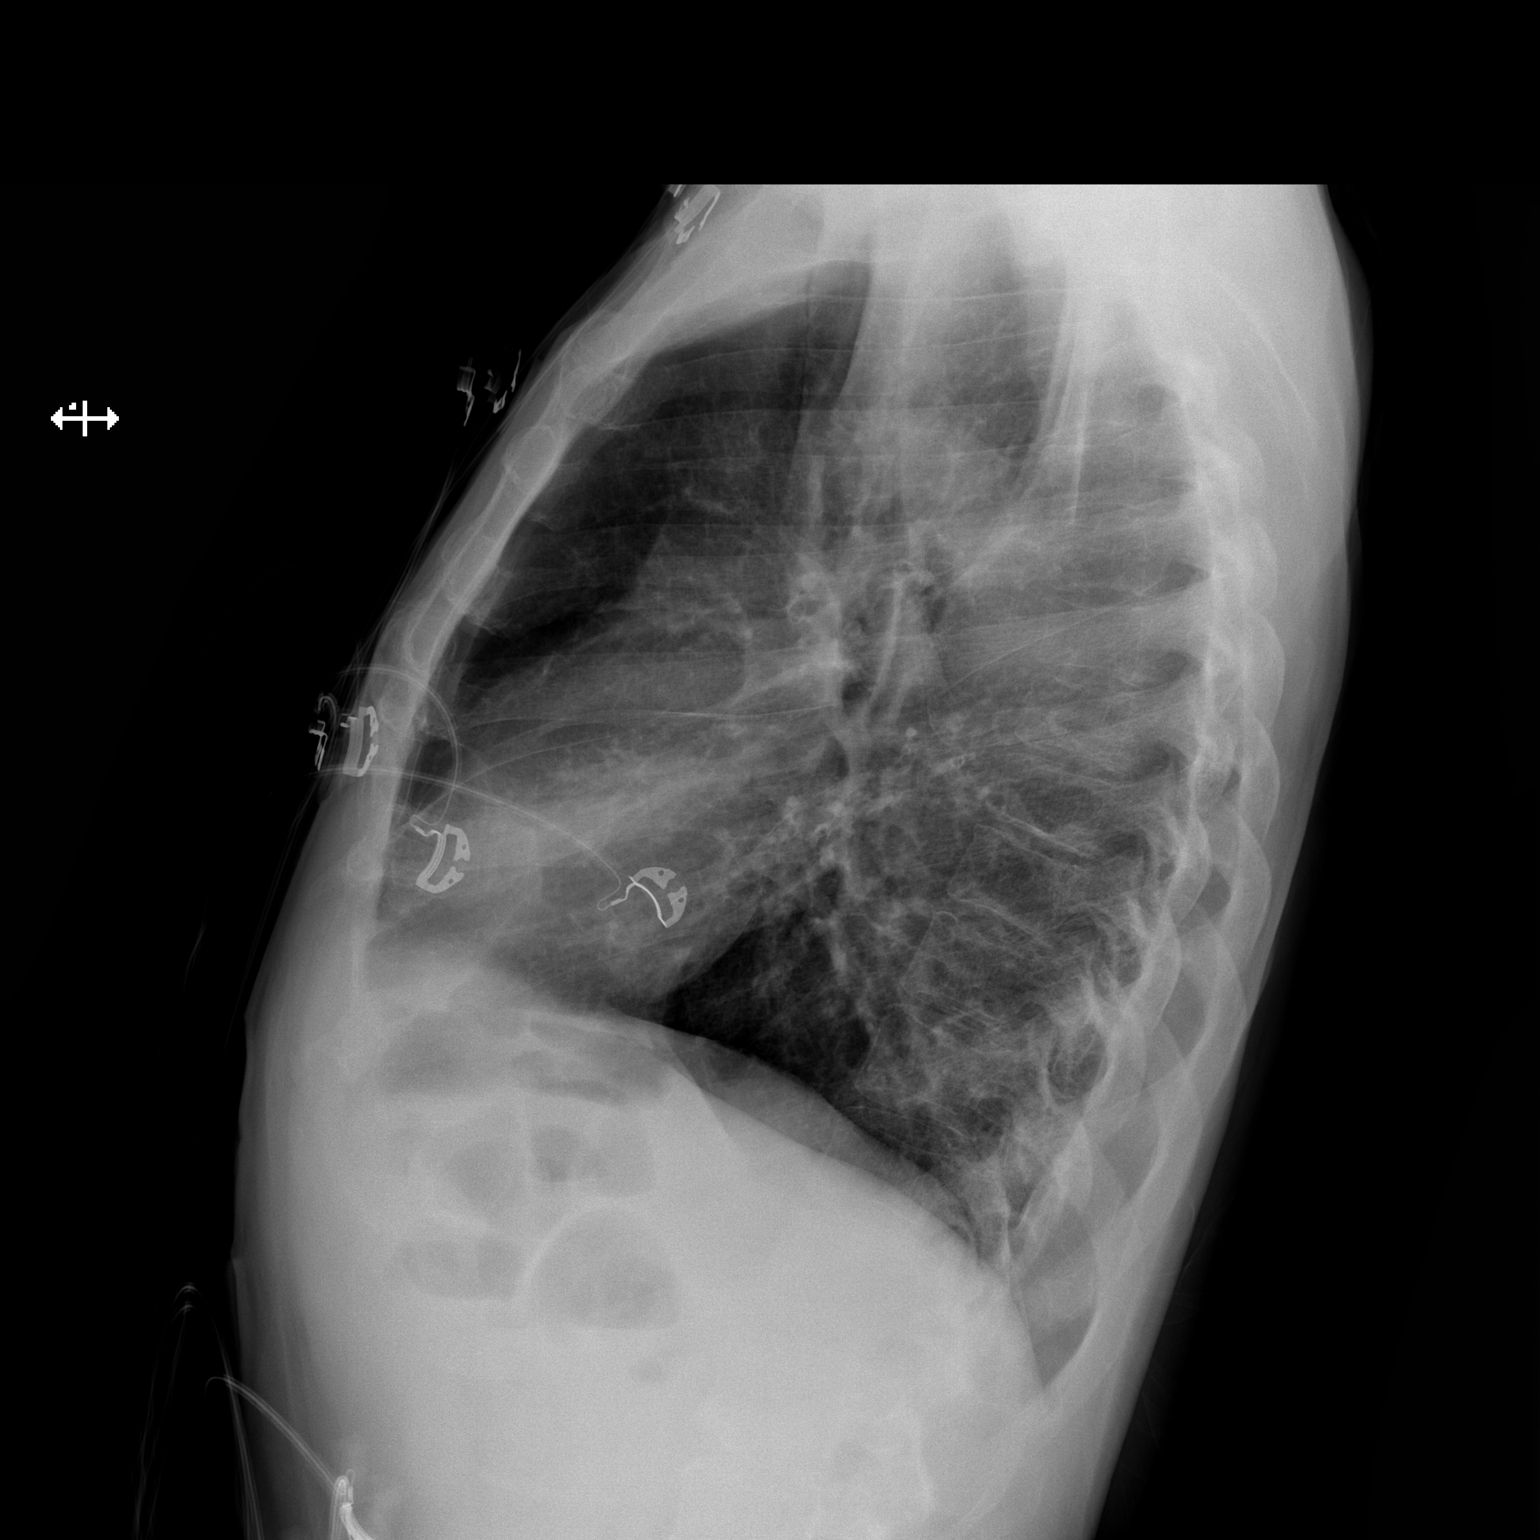

[2 of 2 positions shown; findings below may reference images not displayed]

FINDINGS: The heart size and vascular pattern are normal. There is a nodular
opacity over the left lower lobe and 1 over the right lower lobe in
similar position. These are felt to represent nipple shadows. There
is mild opacity in the lingula. There is an element of
hyperinflation suggesting COPD. There is also mild perihilar
bronchial wall thickening which is stable.
IMPRESSION: COPD with mild bronchitic change. Possible lingular infiltrate which
could represent pneumonia. Followup PA and lateral chest X-ray is
recommended in 3-4 weeks following trial of antibiotic therapy to
ensure resolution and exclude underlying malignancy.

Nodular opacities likely nipple shadows given the bilateral
symmetry. This could be confirmed with nipple markers on a repeat PA
radiograph.

## 2017-10-16 IMAGING — CR DG CHEST 2V
2 series · 2 of 2 positions shown · non-contrast
Comparison: December 19, 2015.

CLINICAL DATA: Shortness of breath, cough.

EXAM:
CHEST  2 VIEW

[w chest pa]
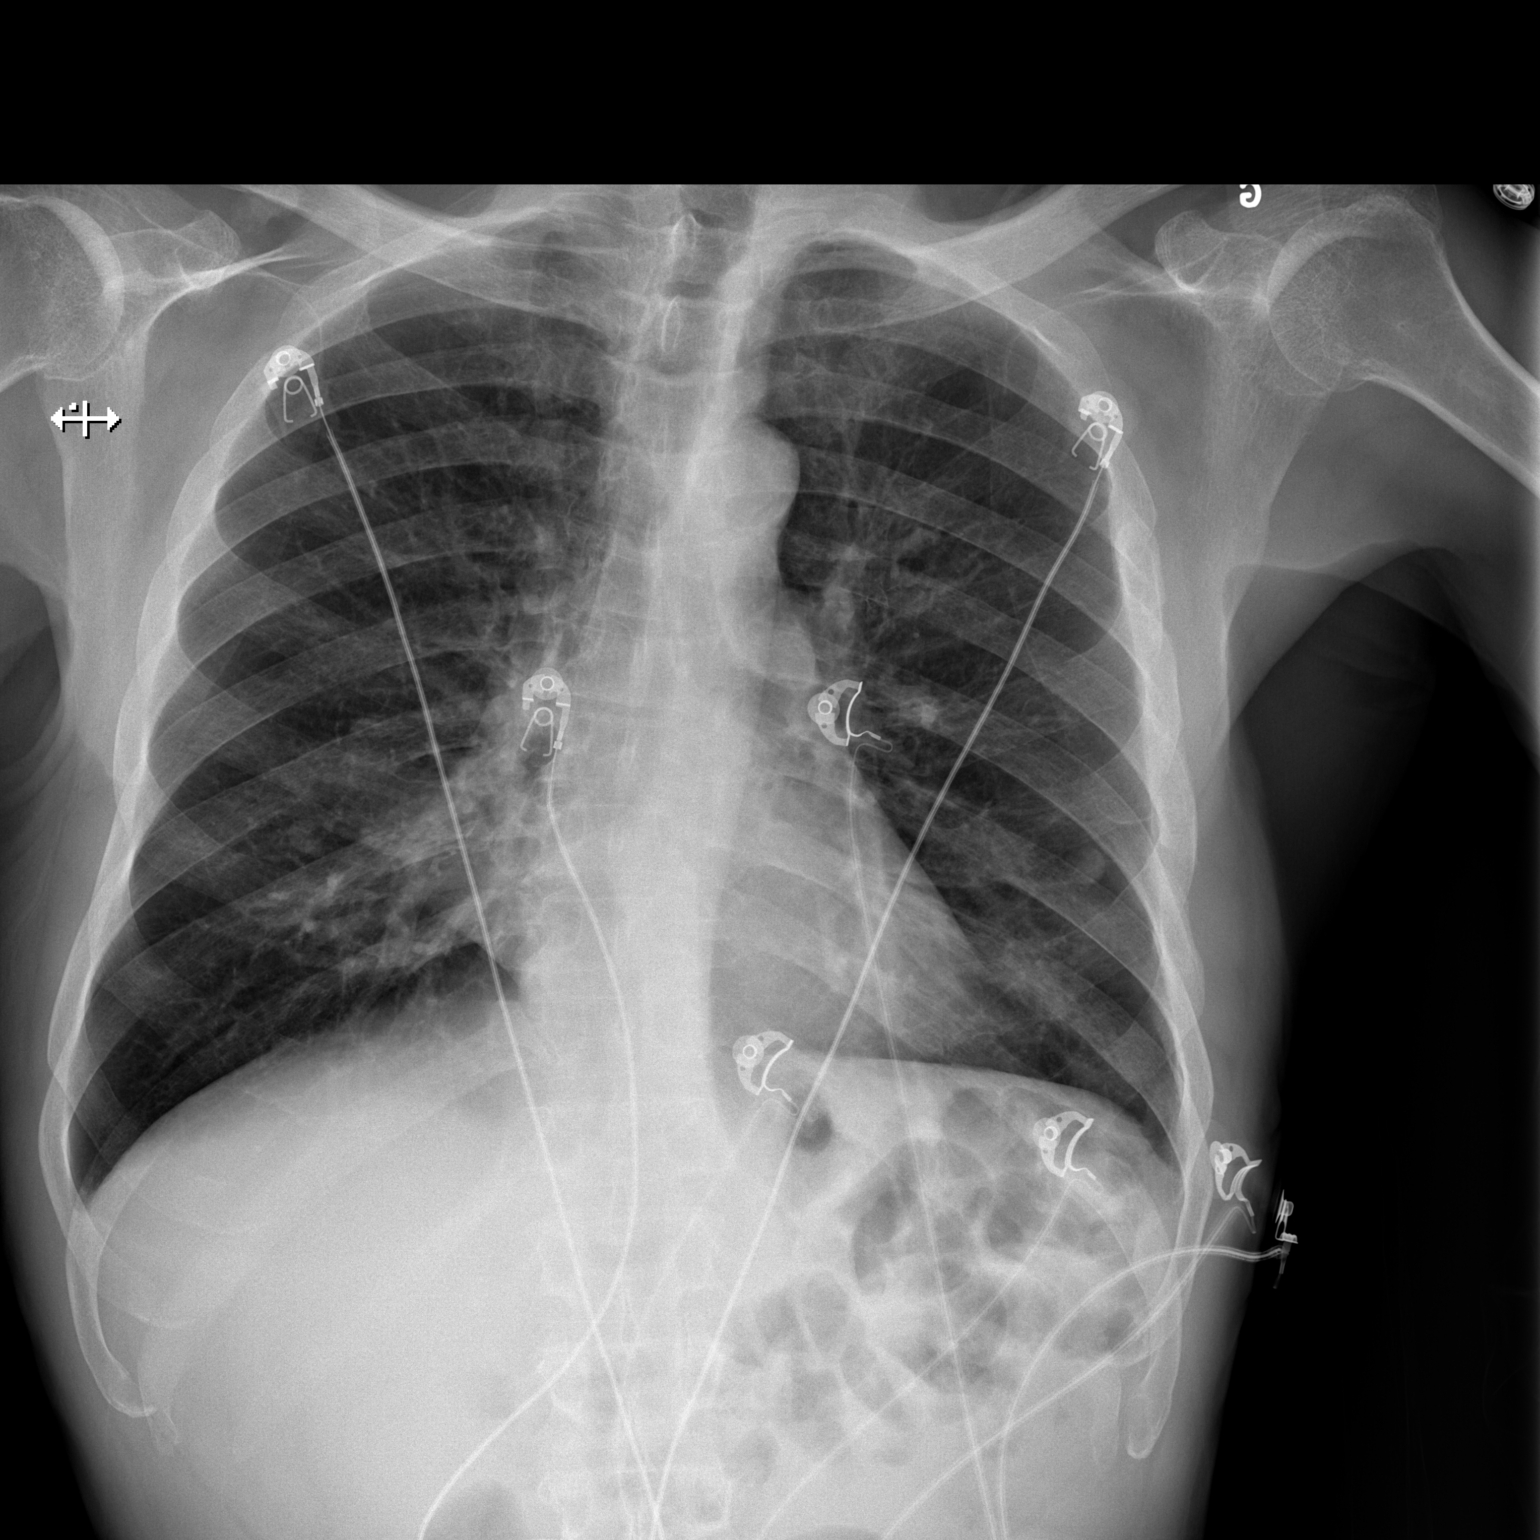

[w chest lat]
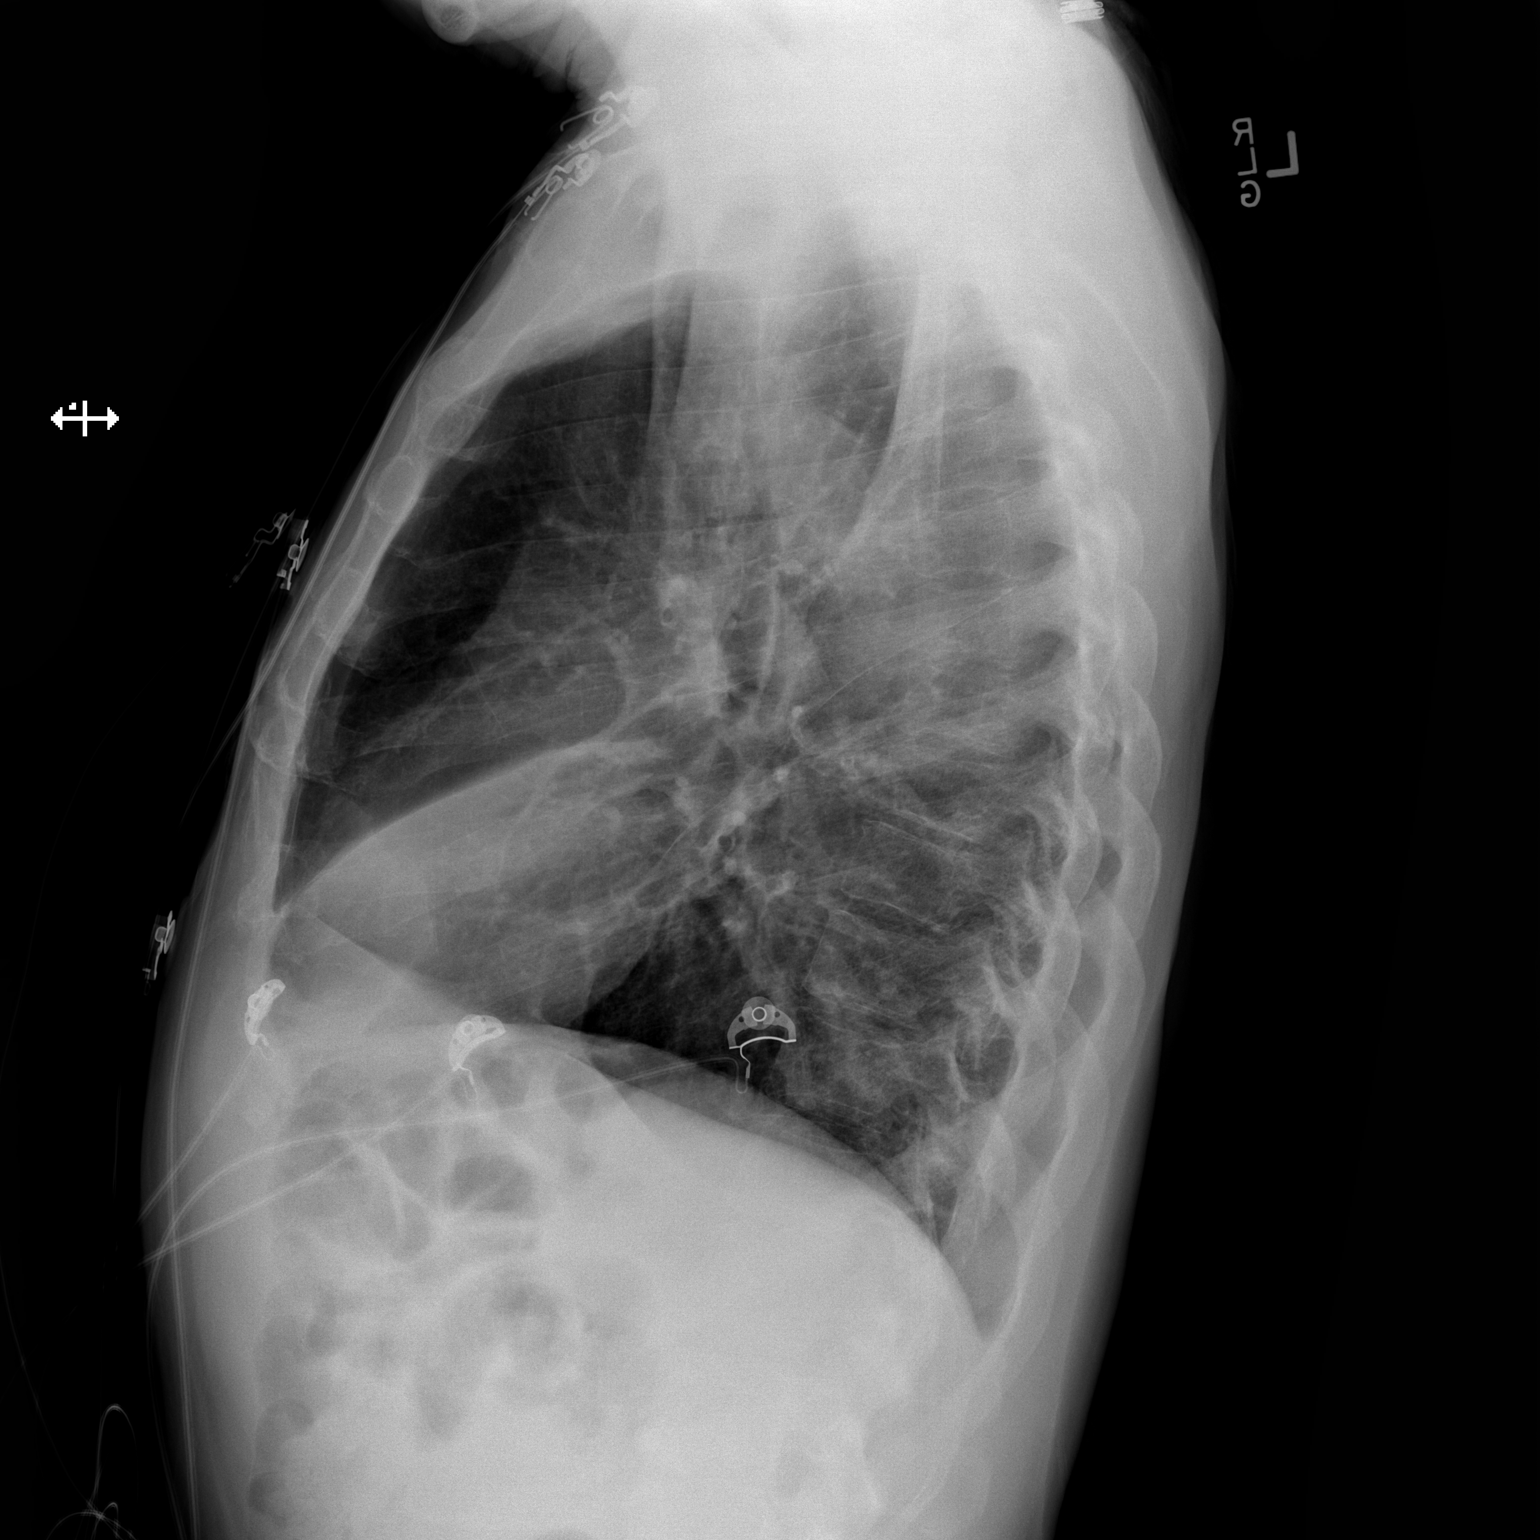

[2 of 2 positions shown; findings below may reference images not displayed]

FINDINGS: The heart size and mediastinal contours are within normal limits. No
pneumothorax or pleural effusion is noted. Right middle lobe opacity
is noted consistent with pneumonia or atelectasis. Left lung is
clear. Old left rib fractures are noted.
IMPRESSION: Right middle lobe pneumonia or atelectasis. Follow-up radiographs
are recommended to ensure resolution and rule out underlying
neoplasm.

## 2017-11-19 ENCOUNTER — Other Ambulatory Visit: Payer: Self-pay | Admitting: Internal Medicine

## 2017-11-19 IMAGING — CR DG CHEST 2V
2 series · 2 of 2 positions shown · non-contrast
Comparison: 01/02/2016

CLINICAL DATA: Cough and congestion.  Shortness of breath

EXAM:
CHEST  2 VIEW

[w chest pa]
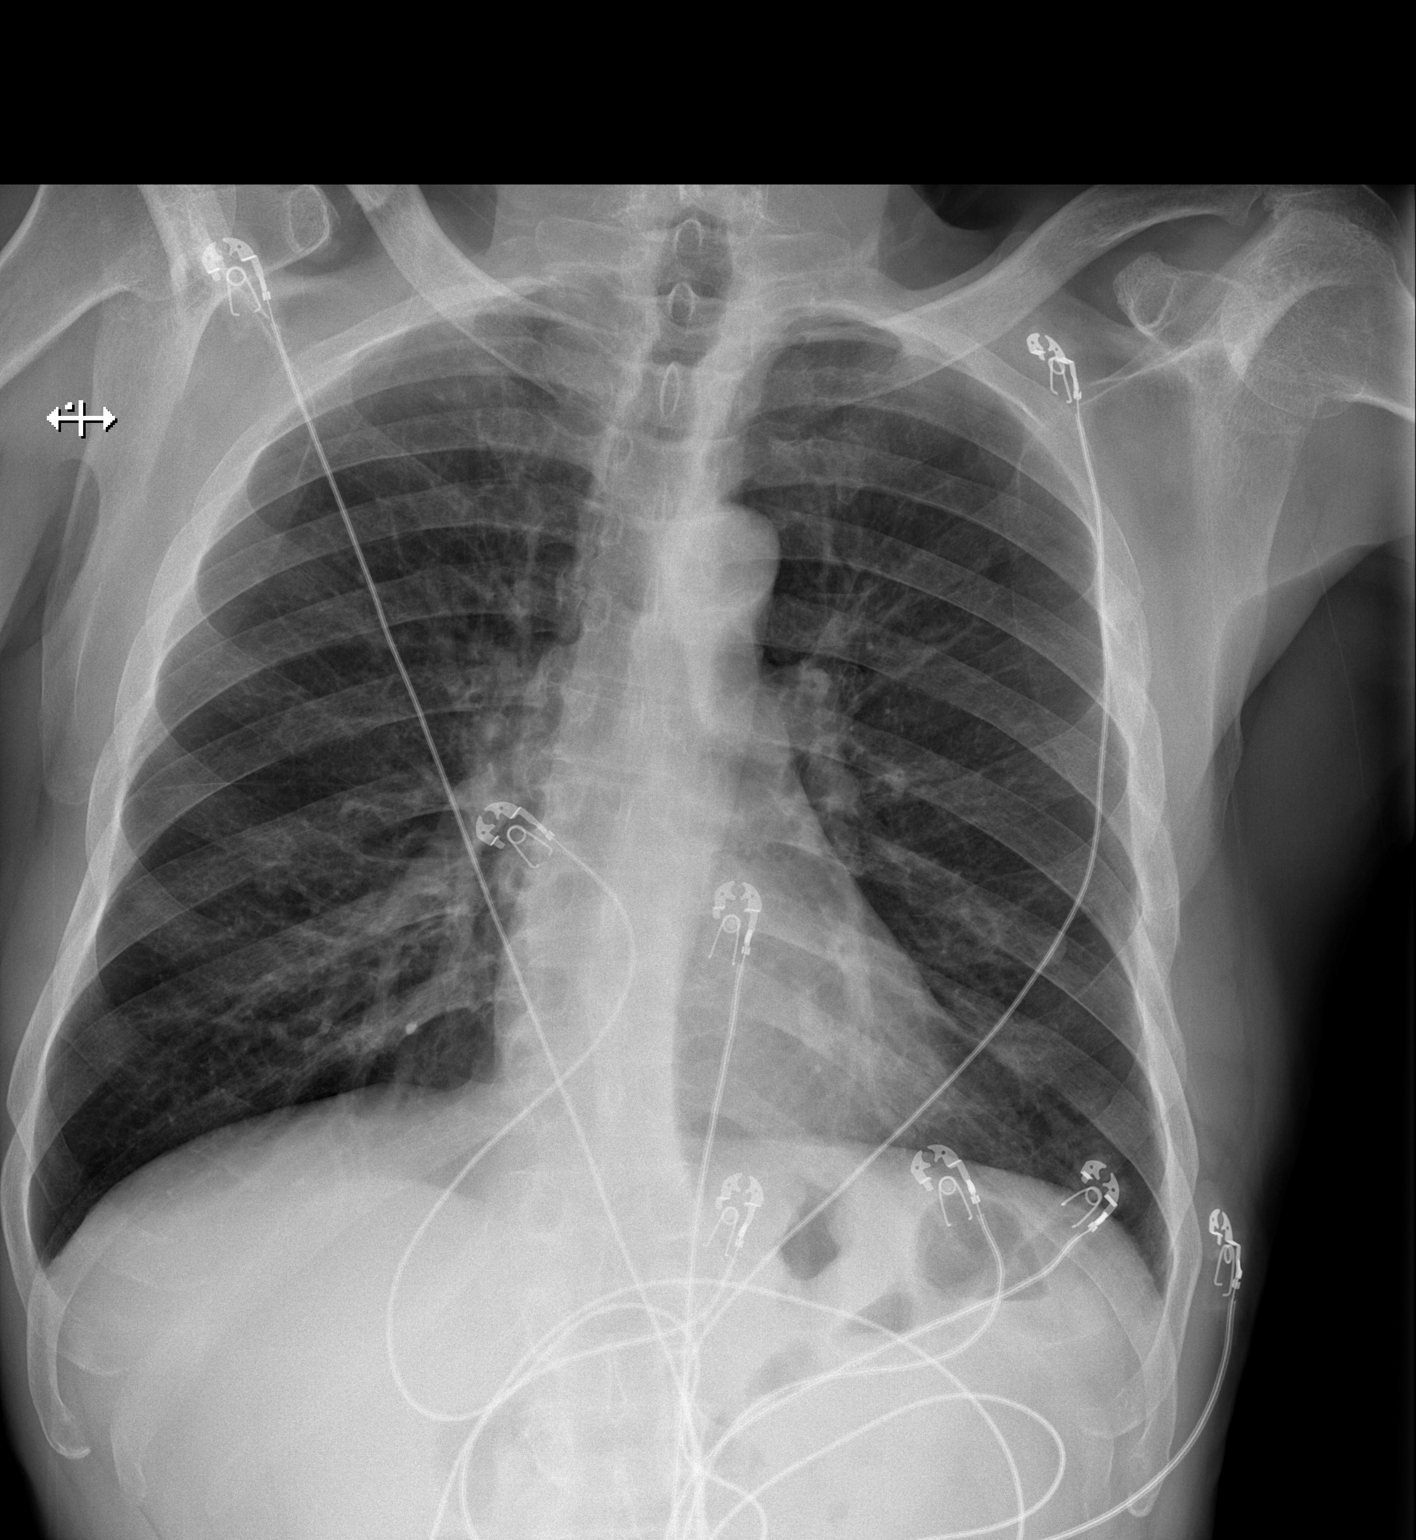

[w chest lat]
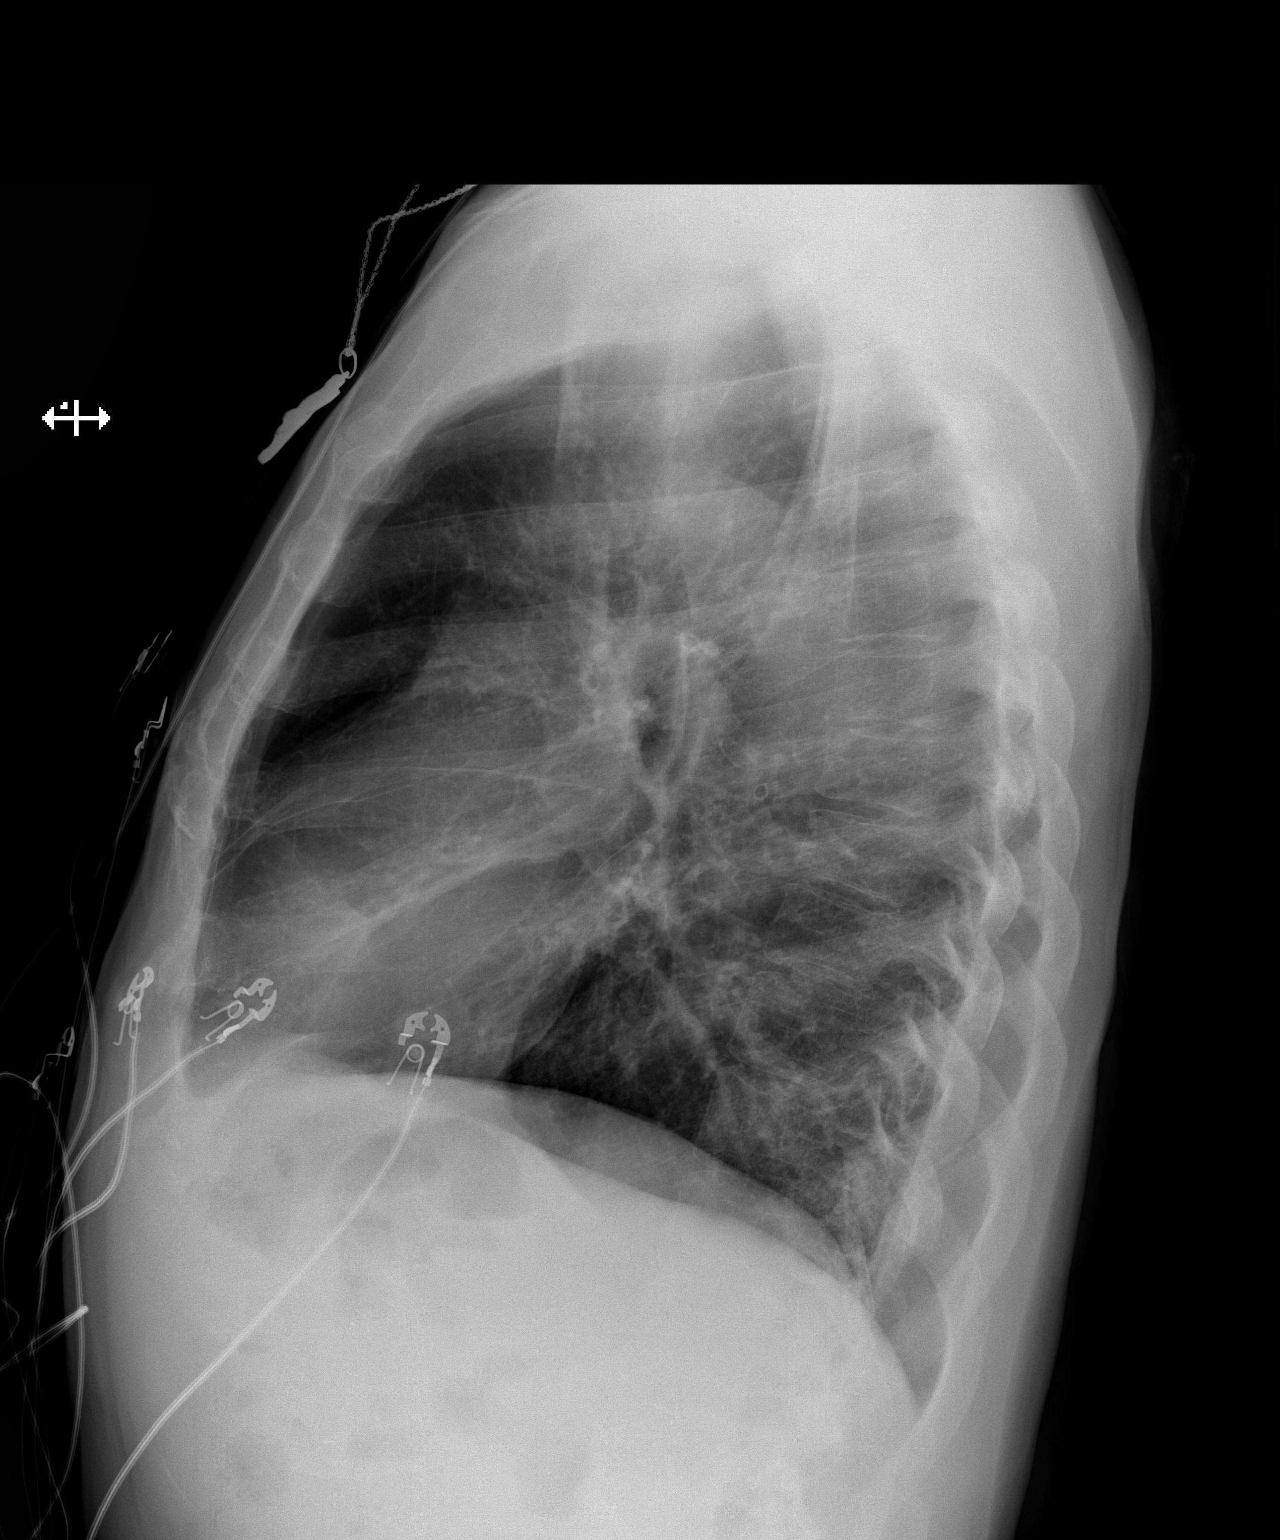

[2 of 2 positions shown; findings below may reference images not displayed]

FINDINGS: Chronic mild hyperinflation and bronchitic markings. Patient has
history of COPD. There is no edema, consolidation, effusion, or
pneumothorax. The right middle lobe has reinflated since previous.
There was no central airway lesion seen on chest CT 09/22/2015.
Normal heart size mediastinal contours. Remote left rib fractures.
IMPRESSION: COPD without acute superimposed finding.

## 2017-11-23 ENCOUNTER — Other Ambulatory Visit: Payer: Self-pay | Admitting: Internal Medicine

## 2017-11-23 MED ORDER — PANTOPRAZOLE SODIUM 40 MG PO TBEC: DELAYED_RELEASE_TABLET | ORAL | 0 refills | Status: DC

## 2017-12-08 ENCOUNTER — Other Ambulatory Visit: Payer: Self-pay | Admitting: Internal Medicine

## 2017-12-13 ENCOUNTER — Other Ambulatory Visit: Payer: Self-pay | Admitting: Internal Medicine

## 2017-12-14 ENCOUNTER — Ambulatory Visit (INDEPENDENT_AMBULATORY_CARE_PROVIDER_SITE_OTHER): Payer: Medicare Other | Admitting: Internal Medicine

## 2017-12-14 ENCOUNTER — Ambulatory Visit (INDEPENDENT_AMBULATORY_CARE_PROVIDER_SITE_OTHER)
Admission: RE | Admit: 2017-12-14 | Discharge: 2017-12-14 | Disposition: A | Discharge status: Home / Self Care | Payer: Medicare Other | Source: Ambulatory Visit | Attending: Internal Medicine | Admitting: Internal Medicine

## 2017-12-14 ENCOUNTER — Encounter: Payer: Self-pay | Admitting: Internal Medicine

## 2017-12-14 VITALS — BP 132/82 | HR 81 | Ht 65.0 in | Wt 151.0 lb

## 2017-12-14 DIAGNOSIS — J4530 Mild persistent asthma, uncomplicated: Secondary | ICD-10-CM | POA: Diagnosis not present

## 2017-12-14 DIAGNOSIS — R058 Other specified cough: Secondary | ICD-10-CM

## 2017-12-14 DIAGNOSIS — R05 Cough: Secondary | ICD-10-CM

## 2017-12-14 MED ORDER — AMOXICILLIN-POT CLAVULANATE 875-125 MG PO TABS: 1.0000 | ORAL_TABLET | Freq: Two times a day (BID) | ORAL | 0 refills | Status: AC

## 2017-12-14 MED ORDER — FAMOTIDINE 20 MG PO TABS: ORAL_TABLET | ORAL | 2 refills | Status: DC

## 2017-12-14 MED ORDER — PREDNISONE 10 MG PO TABS: ORAL_TABLET | ORAL | 0 refills | Status: DC

## 2017-12-14 MED ORDER — PANTOPRAZOLE SODIUM 40 MG PO TBEC: 40.0000 mg | DELAYED_RELEASE_TABLET | Freq: Every day | ORAL | 2 refills | Status: DC

## 2017-12-14 MED ORDER — ACETAMINOPHEN-CODEINE #3 300-30 MG PO TABS: 1.0000 | ORAL_TABLET | ORAL | 0 refills | Status: AC | PRN

## 2017-12-14 NOTE — Patient Instructions (Addendum)
Plan A = Automatic = Symbicort 160 Take 2 puffs first thing in am and then another 2 puffs about 12 hours later.    Work on inhaler technique:  relax and gently blow all the way out then take a nice smooth deep breath back in, triggering the inhaler at same time you start breathing in.  Hold for up to 5 seconds if you can. Blow out thru nose. Rinse and gargle with water when done    Plan B = Backup Only use your albuterol as a rescue medication to be used if you can't catch your breath by resting or doing a relaxed purse lip breathing pattern.  - The less you use it, the better it will work when you need it. - Ok to use the inhaler up to 2 puffs  every 4 hours if you must but call for appointment if use goes up over your usual need - Don't leave home without it !!  (think of it like the spare tire for your car)   Plan C = Crisis - only use your albuterol nebulizer if you first try Plan B and it fails to help > ok to use the nebulizer up to every 4 hours but if start needing it regularly call for immediate appointment        Pantoprazole (protonix) 40 mg   Take  30-60 min before first meal of the day and Pepcid (famotidine)  20 mg one @  bedtime until return to office - this is the best way to tell whether stomach acid is contributing to your problem.   Prednisone 10 mg take  4 each am x 2 days,   2 each am x 2 days,  1 each am x 2 days and stop   Augmentin 875 mg take one pill twice daily  X 10 days - take at breakfast and supper with large glass of water.  It would help reduce the usual side effects (diarrhea and yeast infections) if you ate cultured yogurt at lunch.  Take mucinex dm up to 1200mg  every 12 hours and supplement if needed with  Tylenol #3  up to 1 every 4 hours to suppress the urge to cough. Swallowing water and/or using ice chips/non mint and menthol containing candies (such as lifesavers or sugarless jolly ranchers) are also effective.  You should rest your voice and avoid  activities that you know make you cough.  Once you have eliminated the cough for 3 straight days try reducing the tylenol #3  first,  then the mucinex dm  as tolerated.     GERD (REFLUX)  is an extremely common cause of respiratory symptoms just like yours , many times with no obvious heartburn at all.    It can be treated with medication, but also with lifestyle changes including elevation of the head of your bed (ideally with 6 inch  bed blocks),  Smoking cessation, avoidance of late meals, excessive alcohol, and avoid fatty foods, chocolate, peppermint, colas, red wine, and acidic juices such as orange juice.  NO MINT OR MENTHOL PRODUCTS SO NO COUGH DROPS   USE SUGARLESS CANDY INSTEAD (Jolley ranchers or Stover's or Life Savers) or even ice chips will also do - the key is to swallow to prevent all throat clearing. NO OIL BASED VITAMINS - use powdered substitutes.   Please remember to go to the  x-ray department downstairs in the basement  for your tests - we will call you with the results when  they are available.       Please schedule a follow up office visit in 4 weeks, sooner if needed  with all medications /inhalers/ solutions in hand so we can verify exactly what you are taking. This includes all medications from all doctors and over the counters  with pfts on return
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
## 2017-12-15 ENCOUNTER — Encounter: Payer: Self-pay | Admitting: Internal Medicine

## 2017-12-15 NOTE — Assessment & Plan Note (Addendum)
10/30/2016    try reduce symb to 80 2bid  - 01/21/2017  After extensive coaching HFA effectiveness =    75% via adaptor to trach  - Spirometry 12/14/2017  FEV1 1.59 (66%)  Ratio 65 after symb with poor hfa  - 12/14/2017  After extensive coaching inhaler device  effectiveness =    75% from a baseline of 25 % > continue symb 160 2bid    DDX of  difficult airways management almost all start with A and  include Adherence, Ace Inhibitors, Acid Reflux, Active Sinus Disease, Alpha 1 Antitripsin deficiency, Anxiety masquerading as Airways dz,  ABPA,  Allergy(esp in young), Aspiration (esp in elderly), Adverse effects of meds,  Active smokers, A bunch of PE's (a small clot burden can't cause this syndrome unless there is already severe underlying pulm or vascular dz with poor reserve) plus two Bs  = Bronchiectasis and Beta blocker use..and one C= CHF   Adherence is always the initial "prime suspect" and is a multilayered concern that requires a "trust but verify" approach in every patient - starting with knowing how to use medications, especially inhalers, correctly, keeping up with refills and understanding the fundamental difference between maintenance and prns vs those medications only taken for a very short course and then stopped and not refilled.  - see hfa teaching - return with all meds in hand using a trust but verify approach to confirm accurate Medication  Reconciliation The principal here is that until we are certain that the  patients are doing what we've asked, it makes no sense to ask them to do more.    ? Acid (or non-acid) GERD > always difficult to exclude as up to 75% of pts in some series report no assoc GI/ Heartburn symptoms> rec max (24h)  acid suppression and diet restrictions/ reviewed and instructions given in writing.  - Of the three most common causes of  Sub-acute or recurrent or chronic cough, only one (GERD)  can actually contribute to/ trigger  the other two (asthma and post nasal drip  syndrome)  and perpetuate the cylce of cough.  While not intuitively obvious, many patients with chronic low grade reflux do not cough until there is a primary insult that disturbs the protective epithelial barrier and exposes sensitive nerve endings.   This is typically viral but can be direct physical injury such as with an endotracheal tube.   The point is that once this occurs, it is difficult to eliminate the cycle  using anything but a maximally effective acid suppression regimen at least in the short run, accompanied by an appropriate diet to address non acid GERD and control / eliminate the cough itself for at least 3 days with codeine   ? Allergy  > continue singulair and high dose symb plus rec Prednisone 10 mg take  4 each am x 2 days,   2 each am x 2 days,  1 each am x 2 days and stop   ? Active sinus dz/ rhinitis vs sinusitis > Augmentin 875 mg take one pill twice daily  X 10 days - take at breakfast and supper with large glass of water/ continue flonase       F/u in 4 weeks with pfts on return    I had an extended discussion with the patient reviewing all relevant studies completed to date and  lasting 25 minutes of a 40  minute acute office visit to re establish     re  severe  non-specific but potentially very serious refractory respiratory symptoms of uncertain and potentially multiple  etiologies.   Each maintenance medication was reviewed in detail including most importantly the difference between maintenance and prns and under what circumstances the prns are to be triggered using an action plan format that is not reflected in the computer generated alphabetically organized AVS.    Please see AVS for specific instructions unique to this office visit that I personally wrote and verbalized to the the pt in detail and then reviewed with pt  by my nurse highlighting any changes in therapy/plan of care  recommended at today's visit.

## 2017-12-15 NOTE — Assessment & Plan Note (Signed)
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

## 2017-12-23 IMAGING — CR DG CHEST 2V
2 series · 2 of 2 positions shown · non-contrast
Comparison: February 05, 2016

CLINICAL DATA: Shortness of Breath

EXAM:
CHEST  2 VIEW

[w chest pa]
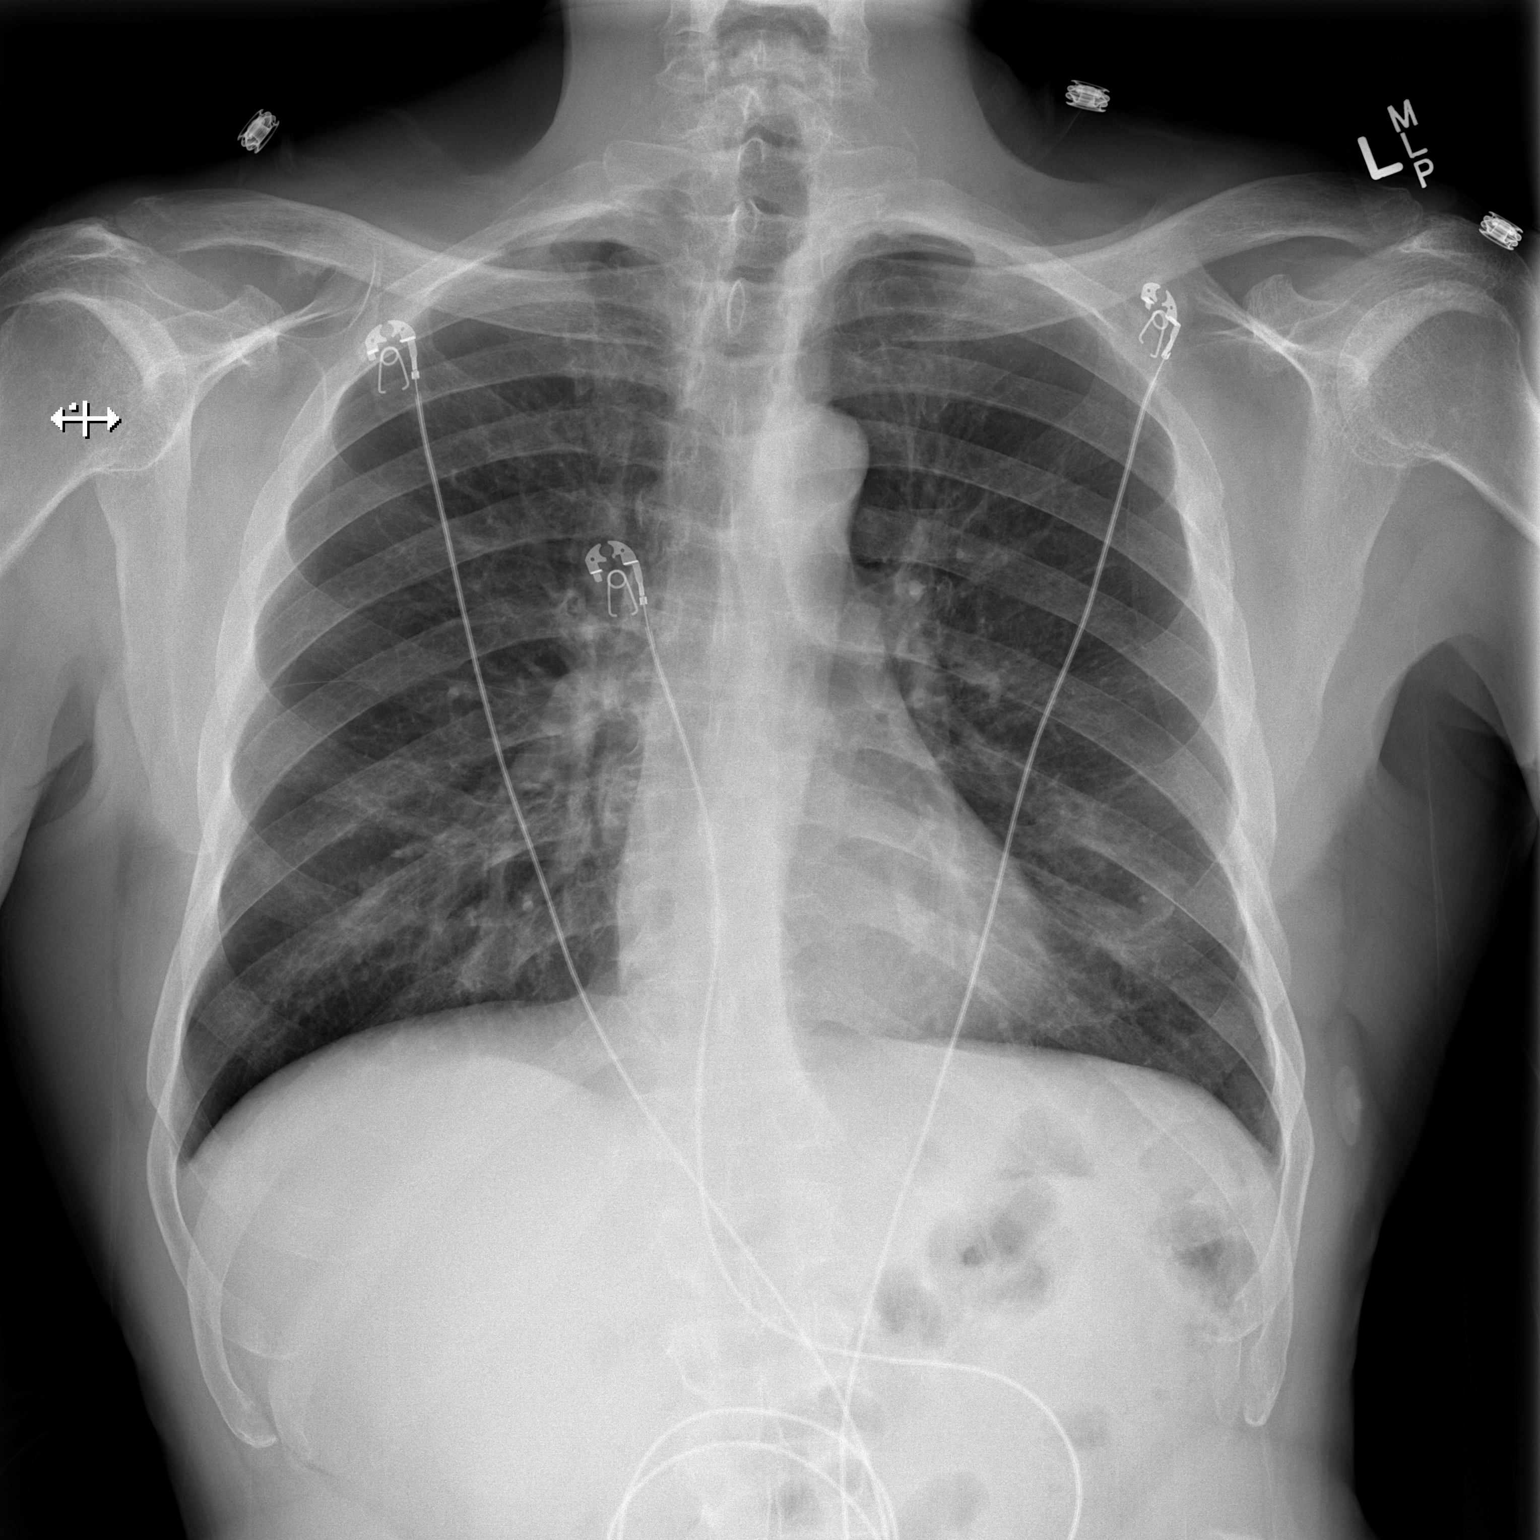

[w chest lat]
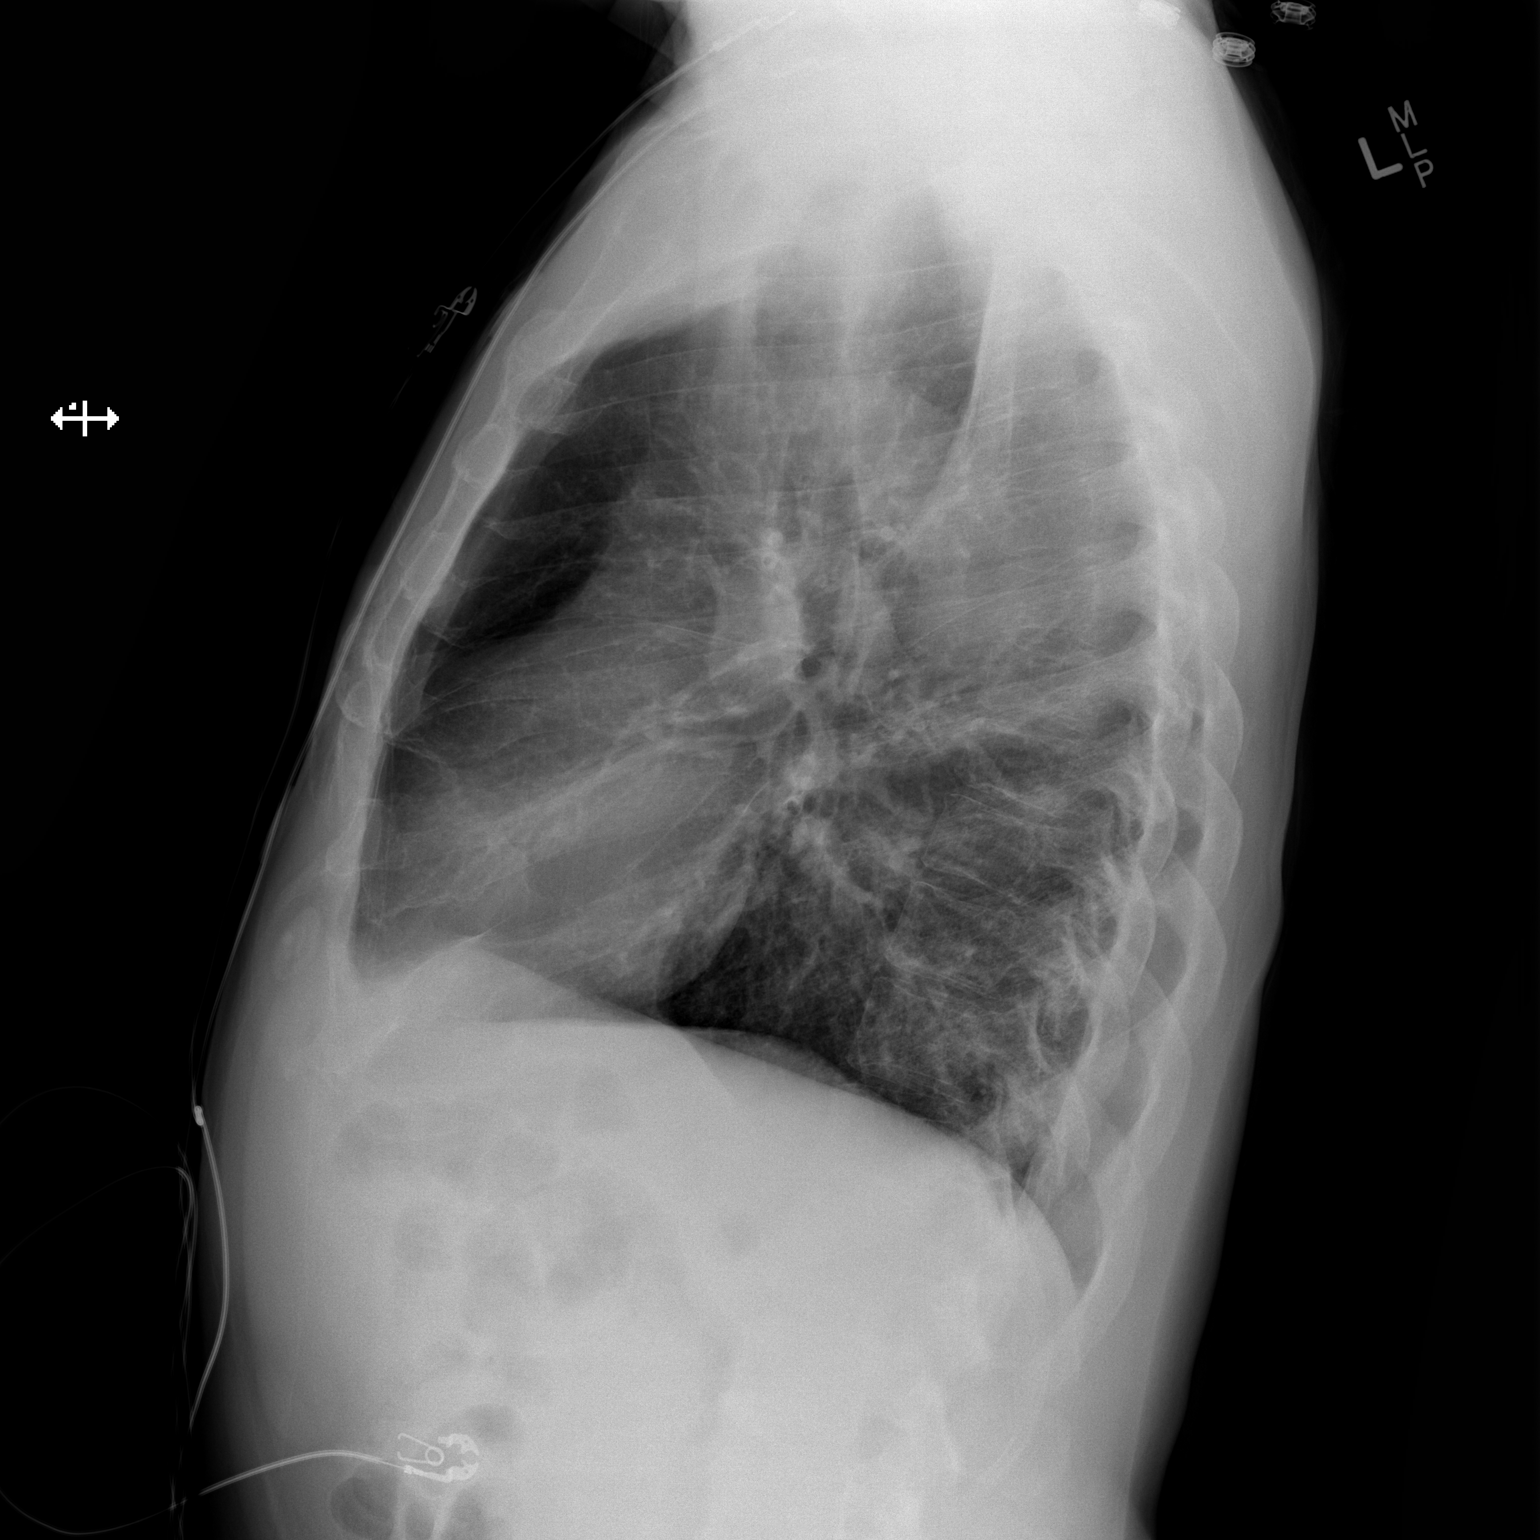

[2 of 2 positions shown; findings below may reference images not displayed]

FINDINGS: There is mild central peribronchial thickening, stable. No edema or
consolidation. Heart size and pulmonary vascularity are normal. No
adenopathy. There is upper lumbar levoscoliosis. Mild calcification
is noted in the aortic arch region.
IMPRESSION: Changes suggesting chronic bronchitis. No edema or consolidation.
Aortic atherosclerotic change.

## 2018-01-10 ENCOUNTER — Emergency Department (HOSPITAL_COMMUNITY): Payer: Medicare Other

## 2018-01-10 ENCOUNTER — Encounter (HOSPITAL_COMMUNITY): Payer: Self-pay | Admitting: Emergency Medicine

## 2018-01-10 ENCOUNTER — Observation Stay (HOSPITAL_COMMUNITY)
Admission: EM | Admit: 2018-01-10 | Discharge: 2018-01-11 | Disposition: A | Discharge status: Home / Self Care | Payer: Medicare Other | Attending: Internal Medicine | Admitting: Internal Medicine

## 2018-01-10 ENCOUNTER — Other Ambulatory Visit: Payer: Self-pay

## 2018-01-10 DIAGNOSIS — Z79899 Other long term (current) drug therapy: Secondary | ICD-10-CM | POA: Diagnosis not present

## 2018-01-10 DIAGNOSIS — J0111 Acute recurrent frontal sinusitis: Secondary | ICD-10-CM

## 2018-01-10 DIAGNOSIS — J441 Chronic obstructive pulmonary disease with (acute) exacerbation: Secondary | ICD-10-CM | POA: Diagnosis present

## 2018-01-10 DIAGNOSIS — J45902 Unspecified asthma with status asthmaticus: Secondary | ICD-10-CM | POA: Diagnosis present

## 2018-01-10 DIAGNOSIS — R0602 Shortness of breath: Secondary | ICD-10-CM | POA: Diagnosis not present

## 2018-01-10 DIAGNOSIS — N401 Enlarged prostate with lower urinary tract symptoms: Secondary | ICD-10-CM | POA: Diagnosis not present

## 2018-01-10 DIAGNOSIS — I10 Essential (primary) hypertension: Secondary | ICD-10-CM | POA: Diagnosis not present

## 2018-01-10 DIAGNOSIS — J45901 Unspecified asthma with (acute) exacerbation: Secondary | ICD-10-CM | POA: Diagnosis present

## 2018-01-10 DIAGNOSIS — R338 Other retention of urine: Secondary | ICD-10-CM | POA: Diagnosis not present

## 2018-01-10 DIAGNOSIS — J449 Chronic obstructive pulmonary disease, unspecified: Secondary | ICD-10-CM

## 2018-01-10 DIAGNOSIS — J9601 Acute respiratory failure with hypoxia: Principal | ICD-10-CM | POA: Insufficient documentation

## 2018-01-10 DIAGNOSIS — R05 Cough: Secondary | ICD-10-CM | POA: Diagnosis not present

## 2018-01-10 DIAGNOSIS — N400 Enlarged prostate without lower urinary tract symptoms: Secondary | ICD-10-CM | POA: Diagnosis not present

## 2018-01-10 HISTORY — DX: Dyspnea, unspecified: R06.00

## 2018-01-10 HISTORY — DX: Other specified cough: R05.8

## 2018-01-10 HISTORY — DX: Pneumonia, unspecified organism: J18.9

## 2018-01-10 HISTORY — DX: Other specified postprocedural states: Z98.890

## 2018-01-10 HISTORY — DX: Unspecified osteoarthritis, unspecified site: M19.90

## 2018-01-10 HISTORY — DX: Unspecified asthma, uncomplicated: J45.909

## 2018-01-10 HISTORY — DX: Anxiety disorder, unspecified: F41.9

## 2018-01-10 HISTORY — DX: Cough: R05

## 2018-01-10 LAB — RESPIRATORY PANEL BY PCR
ADENOVIRUS-RVPPCR: NOT DETECTED
Bordetella pertussis: NOT DETECTED
CHLAMYDOPHILA PNEUMONIAE-RVPPCR: NOT DETECTED
CORONAVIRUS HKU1-RVPPCR: NOT DETECTED
CORONAVIRUS NL63-RVPPCR: NOT DETECTED
CORONAVIRUS OC43-RVPPCR: NOT DETECTED
Coronavirus 229E: NOT DETECTED
Influenza A: NOT DETECTED
Influenza B: NOT DETECTED
METAPNEUMOVIRUS-RVPPCR: NOT DETECTED
Mycoplasma pneumoniae: NOT DETECTED
PARAINFLUENZA VIRUS 1-RVPPCR: NOT DETECTED
PARAINFLUENZA VIRUS 2-RVPPCR: NOT DETECTED
PARAINFLUENZA VIRUS 3-RVPPCR: NOT DETECTED
PARAINFLUENZA VIRUS 4-RVPPCR: NOT DETECTED
RHINOVIRUS / ENTEROVIRUS - RVPPCR: NOT DETECTED
Respiratory Syncytial Virus: NOT DETECTED

## 2018-01-10 LAB — RAPID URINE DRUG SCREEN, HOSP PERFORMED
Amphetamines: NOT DETECTED
BARBITURATES: NOT DETECTED
Benzodiazepines: NOT DETECTED
Cocaine: NOT DETECTED
Opiates: NOT DETECTED
Tetrahydrocannabinol: POSITIVE — AB

## 2018-01-10 LAB — CBC WITH DIFFERENTIAL/PLATELET
Basophils Absolute: 0 10*3/uL (ref 0.0–0.1)
Basophils Relative: 1 %
EOS ABS: 1 10*3/uL — AB (ref 0.0–0.7)
EOS PCT: 13 %
HCT: 43.2 % (ref 39.0–52.0)
Hemoglobin: 14.6 g/dL (ref 13.0–17.0)
LYMPHS ABS: 1.8 10*3/uL (ref 0.7–4.0)
Lymphocytes Relative: 23 %
MCH: 31 pg (ref 26.0–34.0)
MCHC: 33.8 g/dL (ref 30.0–36.0)
MCV: 91.7 fL (ref 78.0–100.0)
Monocytes Absolute: 0.7 10*3/uL (ref 0.1–1.0)
Monocytes Relative: 9 %
Neutro Abs: 4.3 10*3/uL (ref 1.7–7.7)
Neutrophils Relative %: 54 %
Platelets: 248 10*3/uL (ref 150–400)
RBC: 4.71 MIL/uL (ref 4.22–5.81)
RDW: 13.7 % (ref 11.5–15.5)
WBC: 7.7 10*3/uL (ref 4.0–10.5)

## 2018-01-10 LAB — BASIC METABOLIC PANEL
ANION GAP: 10 (ref 5–15)
BUN: 15 mg/dL (ref 6–20)
CHLORIDE: 103 mmol/L (ref 101–111)
CO2: 25 mmol/L (ref 22–32)
Calcium: 9.7 mg/dL (ref 8.9–10.3)
Creatinine, Ser: 0.91 mg/dL (ref 0.61–1.24)
GFR calc non Af Amer: >60 mL/min (ref >60)
Glucose, Bld: 107 mg/dL — ABNORMAL HIGH (ref 65–99)
Potassium: 3.5 mmol/L (ref 3.5–5.1)
Sodium: 138 mmol/L (ref 135–145)

## 2018-01-10 LAB — I-STAT ARTERIAL BLOOD GAS, ED
ACID-BASE DEFICIT: 3 mmol/L — AB (ref 0.0–2.0)
BICARBONATE: 21.6 mmol/L (ref 20.0–28.0)
O2 Saturation: 97 %
PO2 ART: 88 mmHg (ref 83.0–108.0)
Patient temperature: 98.3
TCO2: 23 mmol/L (ref 22–32)
pCO2 arterial: 35.7 mmHg (ref 32.0–48.0)
pH, Arterial: 7.39 (ref 7.350–7.450)

## 2018-01-10 LAB — I-STAT TROPONIN, ED: TROPONIN I, POC: 0 ng/mL (ref 0.00–0.08)

## 2018-01-10 LAB — CBC
HEMATOCRIT: 43.4 % (ref 39.0–52.0)
HEMOGLOBIN: 14.8 g/dL (ref 13.0–17.0)
MCH: 30.8 pg (ref 26.0–34.0)
MCHC: 34.1 g/dL (ref 30.0–36.0)
MCV: 90.4 fL (ref 78.0–100.0)
Platelets: 234 10*3/uL (ref 150–400)
RBC: 4.8 MIL/uL (ref 4.22–5.81)
RDW: 13.7 % (ref 11.5–15.5)
WBC: 5.2 10*3/uL (ref 4.0–10.5)

## 2018-01-10 LAB — BRAIN NATRIURETIC PEPTIDE: B Natriuretic Peptide: 10.3 pg/mL (ref 0.0–100.0)

## 2018-01-10 MED ORDER — TERAZOSIN HCL 5 MG PO CAPS
5.0000 mg | ORAL_CAPSULE | Freq: Every day | ORAL | Status: DC
  Administered 2018-01-10 – 2018-01-11 (×2): 5 mg via ORAL
  Filled 2018-01-10 (×2): qty 1

## 2018-01-10 MED ORDER — LORATADINE 10 MG PO TABS
10.0000 mg | ORAL_TABLET | Freq: Every day | ORAL | Status: DC
  Administered 2018-01-10 – 2018-01-11 (×2): 10 mg via ORAL
  Filled 2018-01-10 (×2): qty 1

## 2018-01-10 MED ORDER — ONDANSETRON HCL 4 MG PO TABS: 4.0000 mg | ORAL_TABLET | Freq: Four times a day (QID) | ORAL | Status: DC | PRN

## 2018-01-10 MED ORDER — PREDNISONE 20 MG PO TABS
40.0000 mg | ORAL_TABLET | Freq: Every day | ORAL | Status: DC
  Administered 2018-01-11: 40 mg via ORAL
  Filled 2018-01-10: qty 2

## 2018-01-10 MED ORDER — AMOXICILLIN-POT CLAVULANATE 875-125 MG PO TABS
1.0000 | ORAL_TABLET | Freq: Two times a day (BID) | ORAL | Status: DC
  Administered 2018-01-10 – 2018-01-11 (×2): 1 via ORAL
  Filled 2018-01-10 (×2): qty 1

## 2018-01-10 MED ORDER — IPRATROPIUM-ALBUTEROL 0.5-2.5 (3) MG/3ML IN SOLN
3.0000 mL | Freq: Four times a day (QID) | RESPIRATORY_TRACT | Status: DC
  Administered 2018-01-10 (×2): 3 mL via RESPIRATORY_TRACT
  Filled 2018-01-10 (×2): qty 3

## 2018-01-10 MED ORDER — DONEPEZIL HCL 5 MG PO TABS
5.0000 mg | ORAL_TABLET | Freq: Every day | ORAL | Status: DC
  Administered 2018-01-10: 5 mg via ORAL
  Filled 2018-01-10: qty 1

## 2018-01-10 MED ORDER — ALBUTEROL SULFATE (2.5 MG/3ML) 0.083% IN NEBU
INHALATION_SOLUTION | RESPIRATORY_TRACT | Status: AC
  Filled 2018-01-10: qty 6

## 2018-01-10 MED ORDER — MOMETASONE FURO-FORMOTEROL FUM 100-5 MCG/ACT IN AERO
2.0000 | INHALATION_SPRAY | Freq: Two times a day (BID) | RESPIRATORY_TRACT | Status: DC
  Administered 2018-01-10 – 2018-01-11 (×2): 2 via RESPIRATORY_TRACT
  Filled 2018-01-10: qty 8.8

## 2018-01-10 MED ORDER — PANTOPRAZOLE SODIUM 40 MG PO TBEC: 40.0000 mg | DELAYED_RELEASE_TABLET | Freq: Every day | ORAL | Status: DC

## 2018-01-10 MED ORDER — METHYLPREDNISOLONE SODIUM SUCC 125 MG IJ SOLR: 60.0000 mg | Freq: Two times a day (BID) | INTRAMUSCULAR | Status: DC

## 2018-01-10 MED ORDER — ENOXAPARIN SODIUM 40 MG/0.4ML ~~LOC~~ SOLN
40.0000 mg | Freq: Every day | SUBCUTANEOUS | Status: DC
  Administered 2018-01-10: 40 mg via SUBCUTANEOUS
  Filled 2018-01-10: qty 0.4

## 2018-01-10 MED ORDER — IRBESARTAN 75 MG PO TABS
75.0000 mg | ORAL_TABLET | Freq: Every day | ORAL | Status: DC
  Administered 2018-01-10 – 2018-01-11 (×2): 75 mg via ORAL
  Filled 2018-01-10 (×2): qty 1

## 2018-01-10 MED ORDER — LACTATED RINGERS IV SOLN
INTRAVENOUS | Status: DC
  Administered 2018-01-10: 21:00:00 via INTRAVENOUS

## 2018-01-10 MED ORDER — ONDANSETRON HCL 4 MG/2ML IJ SOLN: 4.0000 mg | Freq: Four times a day (QID) | INTRAMUSCULAR | Status: DC | PRN

## 2018-01-10 MED ORDER — DOCUSATE SODIUM 100 MG PO CAPS
100.0000 mg | ORAL_CAPSULE | Freq: Two times a day (BID) | ORAL | Status: DC
  Administered 2018-01-10 – 2018-01-11 (×2): 100 mg via ORAL
  Filled 2018-01-10 (×2): qty 1

## 2018-01-10 MED ORDER — ALBUTEROL SULFATE (2.5 MG/3ML) 0.083% IN NEBU
5.0000 mg | INHALATION_SOLUTION | Freq: Once | RESPIRATORY_TRACT | Status: AC
  Administered 2018-01-10: 5 mg via RESPIRATORY_TRACT
  Filled 2018-01-10: qty 6

## 2018-01-10 MED ORDER — FAMOTIDINE 20 MG PO TABS
20.0000 mg | ORAL_TABLET | Freq: Every day | ORAL | Status: DC
  Administered 2018-01-10: 20 mg via ORAL
  Filled 2018-01-10: qty 1

## 2018-01-10 MED ORDER — ACETAMINOPHEN 325 MG PO TABS
650.0000 mg | ORAL_TABLET | Freq: Four times a day (QID) | ORAL | Status: DC | PRN
  Administered 2018-01-11: 650 mg via ORAL
  Filled 2018-01-10: qty 2

## 2018-01-10 MED ORDER — IPRATROPIUM-ALBUTEROL 0.5-2.5 (3) MG/3ML IN SOLN: 3.0000 mL | RESPIRATORY_TRACT | Status: DC | PRN

## 2018-01-10 MED ORDER — ALBUTEROL (5 MG/ML) CONTINUOUS INHALATION SOLN
15.0000 mg/h | INHALATION_SOLUTION | Freq: Once | RESPIRATORY_TRACT | Status: AC
  Administered 2018-01-10: 15 mg/h via RESPIRATORY_TRACT
  Filled 2018-01-10: qty 20

## 2018-01-10 MED ORDER — ACETAMINOPHEN 650 MG RE SUPP
650.0000 mg | Freq: Four times a day (QID) | RECTAL | Status: DC | PRN
Start: 2018-01-10 — End: 2018-01-11

## 2018-01-10 MED ORDER — TIOTROPIUM BROMIDE MONOHYDRATE 18 MCG IN CAPS
18.0000 ug | ORAL_CAPSULE | Freq: Every day | RESPIRATORY_TRACT | Status: DC
  Administered 2018-01-10: 18 ug via RESPIRATORY_TRACT
  Filled 2018-01-10: qty 5

## 2018-01-10 MED ORDER — IPRATROPIUM BROMIDE 0.02 % IN SOLN
1.0000 mg | Freq: Once | RESPIRATORY_TRACT | Status: AC
  Administered 2018-01-10: 1 mg via RESPIRATORY_TRACT
  Filled 2018-01-10: qty 5

## 2018-01-10 MED ORDER — ALBUTEROL SULFATE (2.5 MG/3ML) 0.083% IN NEBU
5.0000 mg | INHALATION_SOLUTION | Freq: Once | RESPIRATORY_TRACT | Status: AC
  Administered 2018-01-10: 5 mg via RESPIRATORY_TRACT

## 2018-01-10 MED ORDER — HYDROCHLOROTHIAZIDE 12.5 MG PO CAPS
12.5000 mg | ORAL_CAPSULE | Freq: Every day | ORAL | Status: DC
  Administered 2018-01-10 – 2018-01-11 (×2): 12.5 mg via ORAL
  Filled 2018-01-10 (×2): qty 1

## 2018-01-10 MED ORDER — FLUTICASONE PROPIONATE 50 MCG/ACT NA SUSP
2.0000 | Freq: Every day | NASAL | Status: DC
  Administered 2018-01-10 – 2018-01-11 (×2): 2 via NASAL
  Filled 2018-01-10: qty 16

## 2018-01-10 MED ORDER — VALSARTAN-HYDROCHLOROTHIAZIDE 80-12.5 MG PO TABS: 1.0000 | ORAL_TABLET | Freq: Every day | ORAL | Status: DC

## 2018-01-10 MED ORDER — IBUPROFEN 400 MG PO TABS
400.0000 mg | ORAL_TABLET | Freq: Once | ORAL | Status: AC
  Administered 2018-01-10: 400 mg via ORAL
  Filled 2018-01-10: qty 1

## 2018-01-10 MED ORDER — HYDROCODONE-ACETAMINOPHEN 5-325 MG PO TABS: 1.0000 | ORAL_TABLET | Freq: Four times a day (QID) | ORAL | Status: DC | PRN

## 2018-01-10 MED ORDER — FINASTERIDE 5 MG PO TABS
5.0000 mg | ORAL_TABLET | Freq: Two times a day (BID) | ORAL | Status: DC
  Administered 2018-01-10 – 2018-01-11 (×2): 5 mg via ORAL
  Filled 2018-01-10 (×2): qty 1

## 2018-01-10 MED ORDER — MONTELUKAST SODIUM 10 MG PO TABS
10.0000 mg | ORAL_TABLET | Freq: Every day | ORAL | Status: DC
  Administered 2018-01-10: 10 mg via ORAL
  Filled 2018-01-10: qty 1

## 2018-01-10 MED ORDER — MAGNESIUM SULFATE 2 GM/50ML IV SOLN
2.0000 g | Freq: Once | INTRAVENOUS | Status: AC
  Administered 2018-01-10: 2 g via INTRAVENOUS
  Filled 2018-01-10: qty 50

## 2018-01-10 MED ORDER — METHYLPREDNISOLONE SODIUM SUCC 125 MG IJ SOLR
125.0000 mg | Freq: Once | INTRAMUSCULAR | Status: AC
  Administered 2018-01-10: 125 mg via INTRAVENOUS
  Filled 2018-01-10: qty 2

## 2018-01-10 MED ORDER — PANTOPRAZOLE SODIUM 40 MG PO TBEC
40.0000 mg | DELAYED_RELEASE_TABLET | Freq: Every day | ORAL | Status: DC
  Administered 2018-01-10 – 2018-01-11 (×2): 40 mg via ORAL
  Filled 2018-01-10 (×2): qty 1

## 2018-01-10 NOTE — ED Notes (Signed)
Pt remains tachynpeic and labored after additional treatment

## 2018-01-10 NOTE — ED Notes (Signed)
Breathing easier since receiving breathing treatment.  Sent out to waiting room at this time.
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
## 2018-01-10 NOTE — ED Notes (Signed)
Pt ambulatory to restroom while in the lobby; started having sudden onset of worsening sob; rhonchi noted bilaterally with slight wheezing, breathing labored. Verbal order obtained for second breathing treatment

## 2018-01-10 NOTE — ED Notes (Signed)
Patient reports that he has COPD, his shortness of breath has been going on for 4 days-he has used all home treatment that he has access to with no success.

## 2018-01-10 NOTE — ED Notes (Signed)
Heart healthy lunch tray ordered 

## 2018-01-10 NOTE — ED Triage Notes (Signed)
Patient reports URI symptoms for a couple of days and woke with increased SOB this morning.  Used inhalers at home with no relief. Noted to be labored with adventitious breath sounds.
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
## 2018-01-10 NOTE — ED Provider Notes (Signed)
Medical screening examination/treatment/procedure(s) were conducted as a shared visit with non-physician practitioner(s) and myself.  I personally evaluated the patient during the encounter.  EKG Interpretation  Date/Time:  Monday January 10 2018 04:04:33 EDT Ventricular Rate:  101 PR Interval:  160 QRS Duration: 74 QT Interval:  328 QTC Calculation: 425 R Axis:   56 Text Interpretation:  Sinus tachycardia Right atrial enlargement Anteroseptal infarct , age undetermined Abnormal ECG When compared with ECG of 02/13/2017, No significant change was found Confirmed by Delora Fuel [id number]) on 01/10/2018 6:14:04 AM Also confirmed by Delora Fuel ([id number]), editor Hattie Perch [phone])  on 01/10/2018 6:51:45 AM   67 year old male presents with several days of shortness of breath associate with cough.  On exam here he has wheezing as well as some rhonchi.  Treated with albuterol and steroids.  Chest x-ray without infiltrate.  Patient will require admission   Lacretia Leigh, MD 01/10/18 1029
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
## 2018-01-10 NOTE — ED Notes (Signed)
Patient O2 is at 88 on room air-2 lit [place] applied=>100% on 2 lit

## 2018-01-11 ENCOUNTER — Other Ambulatory Visit: Payer: Self-pay

## 2018-01-11 ENCOUNTER — Encounter (HOSPITAL_COMMUNITY): Payer: Self-pay | Admitting: *Deleted

## 2018-01-11 DIAGNOSIS — I10 Essential (primary) hypertension: Secondary | ICD-10-CM | POA: Diagnosis not present

## 2018-01-11 DIAGNOSIS — J45902 Unspecified asthma with status asthmaticus: Secondary | ICD-10-CM | POA: Diagnosis not present

## 2018-01-11 DIAGNOSIS — J449 Chronic obstructive pulmonary disease, unspecified: Secondary | ICD-10-CM | POA: Diagnosis not present

## 2018-01-11 DIAGNOSIS — N400 Enlarged prostate without lower urinary tract symptoms: Secondary | ICD-10-CM

## 2018-01-11 DIAGNOSIS — J45901 Unspecified asthma with (acute) exacerbation: Secondary | ICD-10-CM | POA: Diagnosis not present

## 2018-01-11 DIAGNOSIS — J441 Chronic obstructive pulmonary disease with (acute) exacerbation: Secondary | ICD-10-CM | POA: Diagnosis present

## 2018-01-11 LAB — CBC
HEMATOCRIT: 38.9 % — AB (ref 39.0–52.0)
Hemoglobin: 13.3 g/dL (ref 13.0–17.0)
MCH: 31 pg (ref 26.0–34.0)
MCHC: 34.2 g/dL (ref 30.0–36.0)
MCV: 90.7 fL (ref 78.0–100.0)
Platelets: 238 10*3/uL (ref 150–400)
RBC: 4.29 MIL/uL (ref 4.22–5.81)
RDW: 14 % (ref 11.5–15.5)
WBC: 9.4 10*3/uL (ref 4.0–10.5)

## 2018-01-11 LAB — BASIC METABOLIC PANEL
Anion gap: 11 (ref 5–15)
BUN: 14 mg/dL (ref 6–20)
CHLORIDE: 101 mmol/L (ref 101–111)
CO2: 25 mmol/L (ref 22–32)
CREATININE: 0.8 mg/dL (ref 0.61–1.24)
Calcium: 9.2 mg/dL (ref 8.9–10.3)
GFR calc Af Amer: >60 mL/min (ref >60)
GFR calc non Af Amer: >60 mL/min (ref >60)
Glucose, Bld: 129 mg/dL — ABNORMAL HIGH (ref 65–99)
POTASSIUM: 3.7 mmol/L (ref 3.5–5.1)
Sodium: 137 mmol/L (ref 135–145)

## 2018-01-11 MED ORDER — PREDNISONE 5 MG PO TABS: ORAL_TABLET | ORAL | 0 refills | Status: DC

## 2018-01-11 MED ORDER — AMOXICILLIN-POT CLAVULANATE 875-125 MG PO TABS: 1.0000 | ORAL_TABLET | Freq: Two times a day (BID) | ORAL | 0 refills | Status: DC

## 2018-01-11 MED ORDER — ALBUTEROL SULFATE HFA 108 (90 BASE) MCG/ACT IN AERS: 2.0000 | INHALATION_SPRAY | Freq: Four times a day (QID) | RESPIRATORY_TRACT | 2 refills | Status: DC | PRN

## 2018-01-11 MED ORDER — IPRATROPIUM-ALBUTEROL 0.5-2.5 (3) MG/3ML IN SOLN: 3.0000 mL | RESPIRATORY_TRACT | Status: DC | PRN

## 2018-01-11 MED ORDER — IPRATROPIUM-ALBUTEROL 0.5-2.5 (3) MG/3ML IN SOLN
3.0000 mL | Freq: Three times a day (TID) | RESPIRATORY_TRACT | Status: DC
  Administered 2018-01-11 (×2): 3 mL via RESPIRATORY_TRACT
  Filled 2018-01-11 (×2): qty 3

## 2018-01-11 NOTE — Progress Notes (Signed)
Patient walked with pulse ox on, to the nurses station and stayed at 91%-94% RA. Will continue to monitor.   De Nurse, RN

## 2018-01-11 NOTE — Progress Notes (Signed)
Patient discharged to home. Patient AVS reviewed and signed. Patient capable re-verbalizing medications and follow-up appointments. IV removed. Patient belongings sent with patient. Patient educated to return to the ED in the event of SOB, chest pain or dizziness.   Stormi Vandevelde, RN 

## 2018-01-11 NOTE — Care Management CC44 (Signed)
Condition Code 44 Documentation Completed  Patient Details  Name: [name] [name] MRN: [id number] Date of Birth: 1950/12/20   Condition Code 44 given:  Yes Patient signature on Condition Code 44 notice:  Yes Documentation of 2 MD's agreement:  Yes Code 44 added to claim:  Yes    Erenest Rasher, RN 01/11/2018, 4:27 PM

## 2018-01-11 NOTE — Discharge Instructions (Signed)
Follow with Primary MD [name], MD in 7 days   Get CBC, CMP, 2 view Chest X ray checked  by Primary MD in 5-7 days    Activity: As tolerated with Full fall precautions use walker/cane & assistance as needed  Disposition Home   Diet:   Heart Healthy   For Heart failure patients - Check your Weight same time everyday, if you gain over 2 pounds, or you develop in leg swelling, experience more shortness of breath or chest pain, call your Primary MD immediately. Follow Cardiac Low Salt Diet and 1.5 lit/day fluid restriction.  Special Instructions: If you have smoked or chewed Tobacco  in the last 2 yrs please stop smoking, stop any regular Alcohol  and or any Recreational drug use.  On your next visit with your primary care physician please Get Medicines reviewed and adjusted.  Please request your Prim.MD to go over all Hospital Tests and Procedure/Radiological results at the follow up, please get all Hospital records sent to your Prim MD by signing hospital release before you go home.  If you experience worsening of your admission symptoms, develop shortness of breath, life threatening emergency, suicidal or homicidal thoughts you must seek medical attention immediately by calling 911 or calling your MD immediately  if symptoms less severe.  You Must read complete instructions/literature along with all the possible adverse reactions/side effects for all the Medicines you take and that have been prescribed to you. Take any new Medicines after you have completely understood and accpet all the possible adverse reactions/side effects.
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
## 2018-01-11 NOTE — Care Management Obs Status (Signed)
[facility] NOTIFICATION   Patient Details  Name: [name] [name] MRN: [id number] Date of Birth: 03-29-1951   Medicare Observation Status Notification Given:  Yes    Erenest Rasher, RN 01/11/2018, 4:27 PM

## 2018-01-12 IMAGING — DX DG CHEST 2V
2 series · 2 of 2 positions shown · non-contrast
Comparison: Chest radiograph dated 03/10/2016.

CLINICAL DATA: 64 y/o M; 3 days of cough and congestion with sore
throat. Shortness of breath today. History of hypertension and COPD.
Nonsmoker.

EXAM:
CHEST  2 VIEW

[w chest pa]
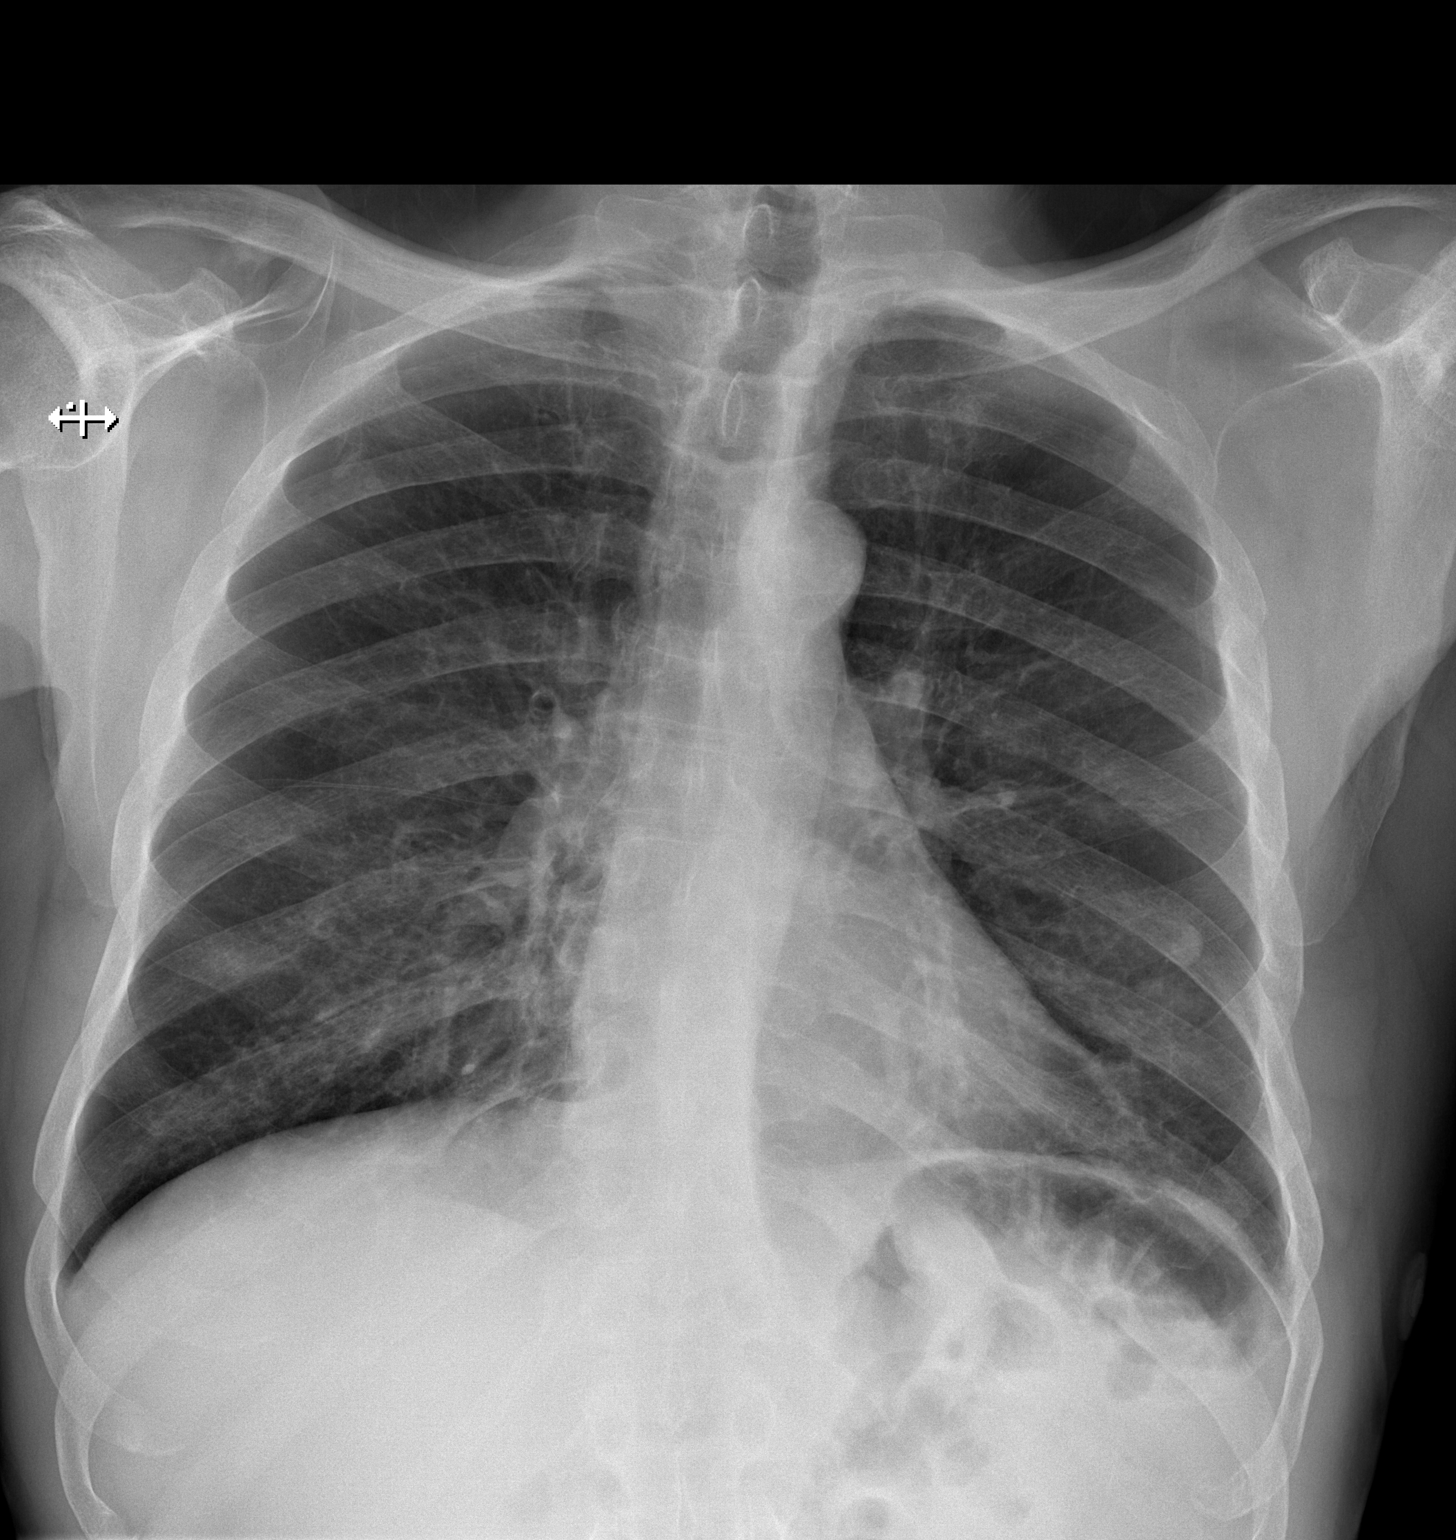

[w chest lat]
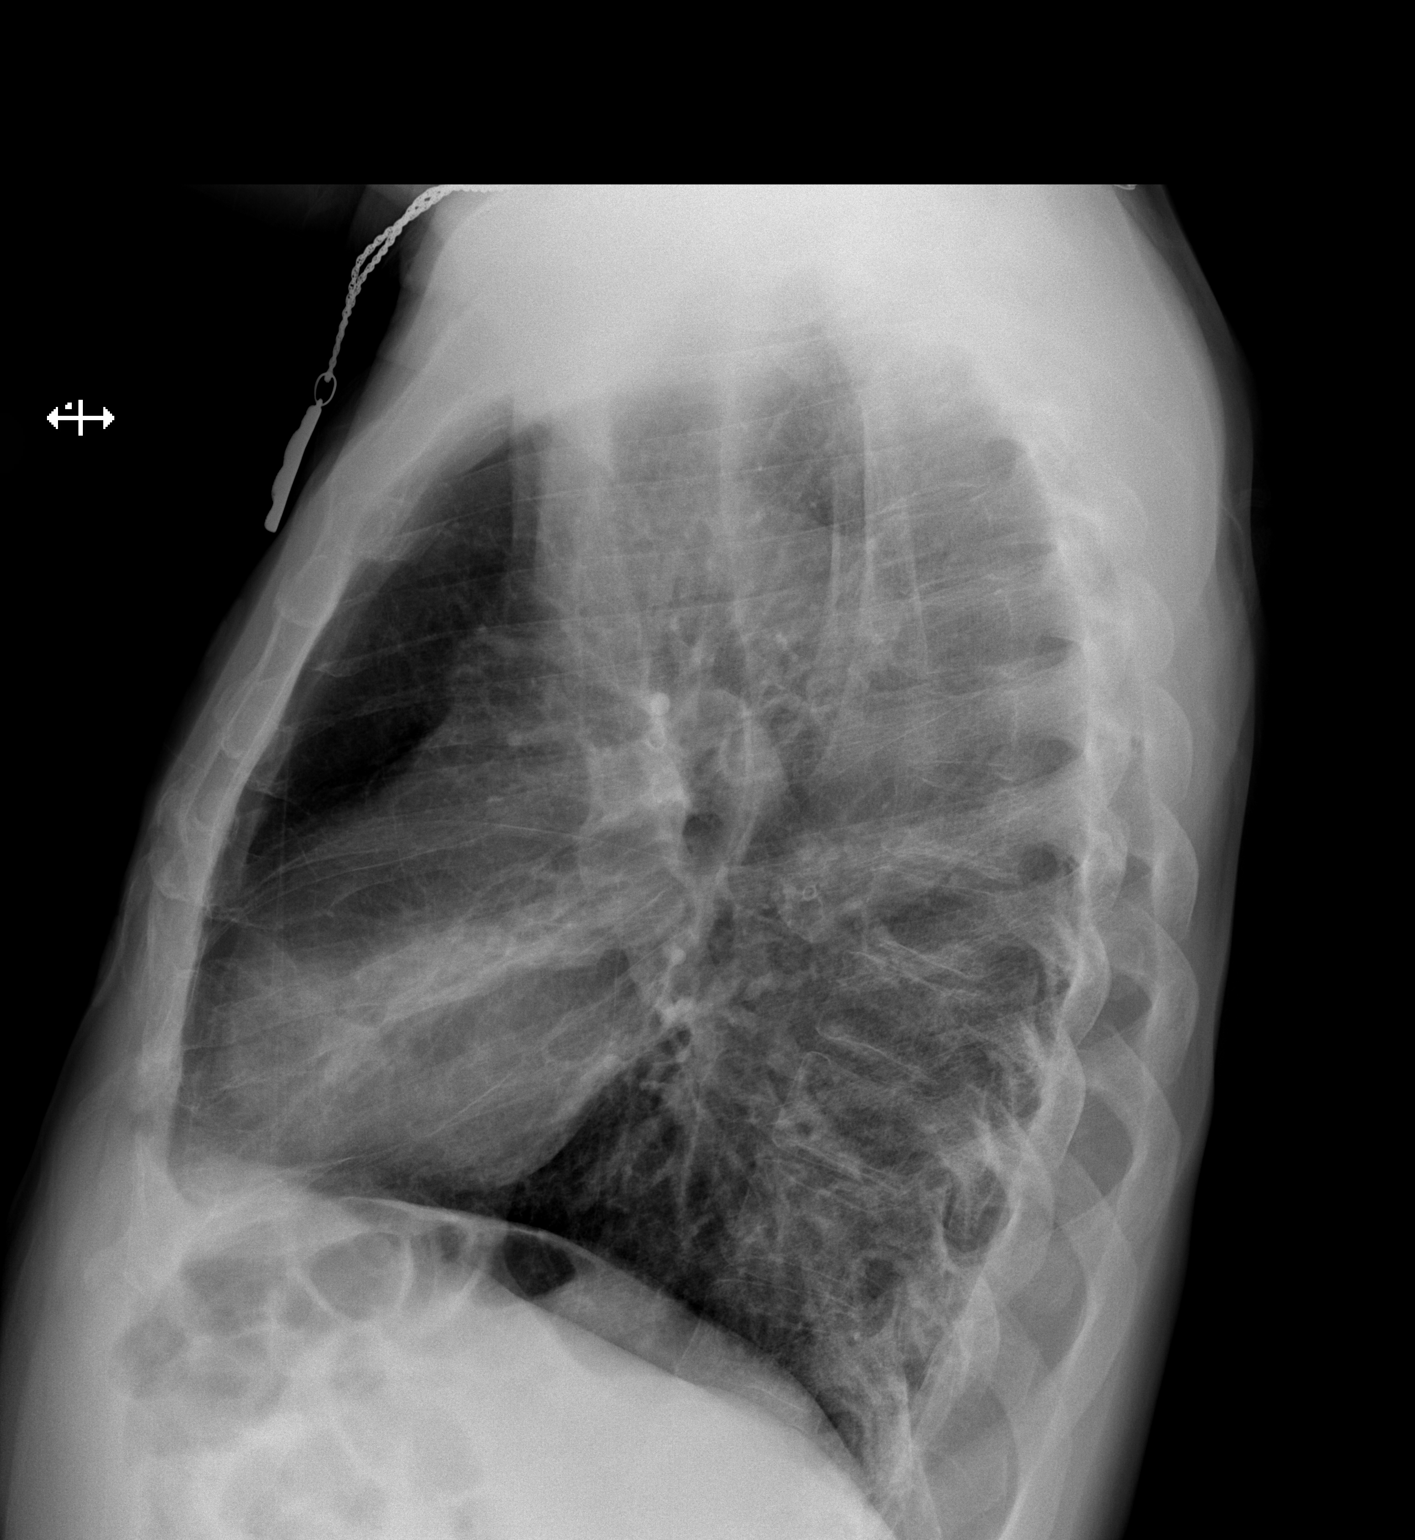

[2 of 2 positions shown; findings below may reference images not displayed]

FINDINGS: Normal cardiomediastinal silhouette. Clear lungs. No consolidation,
pneumothorax, or effusion. New Mild dextro curvature of the
thoracolumbar junction. Bones are otherwise unremarkable.
IMPRESSION: No active cardiopulmonary disease.

By: Abimelk Tiger M.D.

## 2018-01-14 ENCOUNTER — Ambulatory Visit (INDEPENDENT_AMBULATORY_CARE_PROVIDER_SITE_OTHER): Payer: Medicare Other | Admitting: Internal Medicine

## 2018-01-14 ENCOUNTER — Encounter: Payer: Self-pay | Admitting: Internal Medicine

## 2018-01-14 VITALS — BP 124/76 | HR 82 | Ht 66.5 in | Wt 148.0 lb

## 2018-01-14 DIAGNOSIS — R05 Cough: Secondary | ICD-10-CM

## 2018-01-14 DIAGNOSIS — R911 Solitary pulmonary nodule: Secondary | ICD-10-CM | POA: Diagnosis not present

## 2018-01-14 DIAGNOSIS — R058 Other specified cough: Secondary | ICD-10-CM

## 2018-01-14 DIAGNOSIS — J4530 Mild persistent asthma, uncomplicated: Secondary | ICD-10-CM | POA: Diagnosis not present

## 2018-01-14 LAB — PULMONARY FUNCTION TEST
DL/VA % PRED: 118 %
DL/VA: 5.21 ml/min/mmHg/L
DLCO UNC % PRED: 58 %
DLCO cor % pred: 61 %
DLCO cor: 16.91 ml/min/mmHg
DLCO unc: 16.26 ml/min/mmHg
FEF 25-75 Post: 0.82 L/sec
FEF 25-75 Pre: 0.71 L/sec
FEF2575-%CHANGE-POST: 16 %
FEF2575-%PRED-POST: 35 %
FEF2575-%Pred-Pre: 30 %
FEV1-%CHANGE-POST: 3 %
FEV1-%Pred-Post: 51 %
FEV1-%Pred-Pre: 49 %
FEV1-Post: 1.32 L
FEV1-Pre: 1.28 L
FEV1FVC-%CHANGE-POST: 0 %
FEV1FVC-%Pred-Pre: 80 %
FEV6-%Change-Post: 4 %
FEV6-%PRED-PRE: 62 %
FEV6-%Pred-Post: 64 %
FEV6-PRE: 2.01 L
FEV6-Post: 2.09 L
FEV6FVC-%Change-Post: 0 %
FEV6FVC-%PRED-PRE: 103 %
FEV6FVC-%Pred-Post: 104 %
FVC-%CHANGE-POST: 3 %
FVC-%PRED-POST: 62 %
FVC-%PRED-PRE: 60 %
FVC-POST: 2.11 L
FVC-Pre: 2.05 L
PRE FEV6/FVC RATIO: 98 %
Post FEV1/FVC ratio: 63 %
Post FEV6/FVC ratio: 99 %
Pre FEV1/FVC ratio: 62 %
RV % PRED: 161 %
RV: 3.53 L
TLC % pred: 92 %
TLC: 5.85 L

## 2018-01-14 MED ORDER — ACETAMINOPHEN-CODEINE #3 300-30 MG PO TABS: 1.0000 | ORAL_TABLET | ORAL | 0 refills | Status: DC | PRN

## 2018-01-14 MED ORDER — FAMOTIDINE 20 MG PO TABS: 20.0000 mg | ORAL_TABLET | Freq: Every day | ORAL | 2 refills | Status: DC

## 2018-01-14 MED ORDER — GLYCOPYRROLATE-FORMOTEROL 9-4.8 MCG/ACT IN AERO: 2.0000 | INHALATION_SPRAY | Freq: Two times a day (BID) | RESPIRATORY_TRACT | 11 refills | Status: DC

## 2018-01-14 NOTE — Progress Notes (Signed)
PFT completed 01/14/18

## 2018-01-14 NOTE — Patient Instructions (Addendum)
Plan A = Automatic = stop symbicort and spiriva and start Bevespi Take 2 puffs first thing in am and then another 2 puffs about 12 hours later.  Prednisone taper to 10 mg and leave it there   Plan B = Backup Only use your albuterol as a rescue medication to be used if you can't catch your breath by resting or doing a relaxed purse lip breathing pattern.  - The less you use it, the better it will work when you need it. - Ok to use the inhaler up to 2 puffs  every 4 hours if you must but call for appointment if use goes up over your usual need - Don't leave home without it !!  (think of it like the spare tire for your car)   Plan C = Crisis - only use your albuterol nebulizer if you first try Plan B and it fails to help > ok to use the nebulizer up to every 4 hours but if start needing it regularly call for immediate appointment   Pantoprazole (protonix) 40 mg   Take  30-60 min before first meal of the day and add Pepcid (famotidine)  20 mg one @  bedtime until return to office - this is the best way to tell whether stomach acid is contributing to your problem.    For cough > tylenol #3 up to every 4 hours as needed    See Tammy NP w/in 2 weeks ( when your daughter can come) with all your medications, even over the counter meds, separated in two separate bags, the ones you take no matter what vs the ones you stop once you feel better and take only as needed when you feel you need them.   Tammy  will generate for you a new user friendly medication calendar that will put [place] all on the same page re: your medication use.     Without this process, it simply isn't possible to assure that we are providing  your outpatient care  with  the attention to detail we feel you deserve.   If we cannot assure that you're getting that kind of care,  then we cannot manage your problem effectively from this clinic.  Once you have seen Tammy and we are sure that we're all on the same page with your medication use  she will arrange follow up with me.    - NEEDS  F/u cxr
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
## 2018-01-16 ENCOUNTER — Encounter: Payer: Self-pay | Admitting: Internal Medicine

## 2018-01-16 DIAGNOSIS — R911 Solitary pulmonary nodule: Secondary | ICD-10-CM | POA: Insufficient documentation

## 2018-01-16 NOTE — Assessment & Plan Note (Signed)
Needs f/u cxr next ov

## 2018-01-16 NOTE — Assessment & Plan Note (Addendum)
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

## 2018-01-16 NOTE — Assessment & Plan Note (Addendum)
10/30/2016    try reduce symb to 80 2bid  - 01/21/2017  After extensive coaching HFA effectiveness =    75% via adaptor to trach  - Spirometry 12/14/2017  FEV1 1.59 (66%)  Ratio 65 after symb with poor hfa  - 12/14/2017  After extensive coaching inhaler device  effectiveness =    75% from a baseline of 25 % > continue symb 160 2bid   - PFT's  01/14/2018  FEV1 1.32 (51 % ) ratio 63  p 3 % improvement from saba p ? prior to study with DLCO  58/61 % corrects to 118  % for alv volume   - 01/14/2018  After extensive coaching inhaler device  effectiveness =    75% try change to bevespi and floor dose of pred @ 10 mg daily     I had an extended discussion with the patient (and daughter real time by phone)  reviewing all relevant studies completed to date and  lasting 15 to 20 minutes of a 25 minute visit on the following ongoing concerns:   1) most of his symptoms are upper airway at this point and need to be addressed at [facility]  2) adherence will be a major challenge going forward since daughter can't be at office or at his house to make sure he follows complex instructions   3) will try change to bevespi and use prednisone po to see if does better off hfa ics and dpi lama over the next two weeks  4)  See device teaching which extended face to face time for this visit   5) needs med rec.  To keep things simple, I have asked the patient to first separate medicines that are perceived as maintenance, that is to be taken daily "no matter what", from those medicines that are taken on only on an as-needed basis and I have given the patient examples of both, and then return to see our NP to generate a  detailed  medication calendar which should be followed until the next physician sees the patient and updates it.    6) Each maintenance medication was reviewed in detail including most importantly the difference between maintenance and as needed and under what circumstances the prns are to be used.  Please see AVS for  specific  Instructions which are unique to this visit and I personally typed out  which were reviewed in detail in writing with the patient and a copy provided.

## 2018-02-02 ENCOUNTER — Encounter: Payer: Self-pay | Admitting: Adult Health

## 2018-02-02 ENCOUNTER — Ambulatory Visit (INDEPENDENT_AMBULATORY_CARE_PROVIDER_SITE_OTHER): Payer: Medicare Other | Admitting: Adult Health

## 2018-02-02 ENCOUNTER — Telehealth: Payer: Self-pay | Admitting: Internal Medicine

## 2018-02-02 ENCOUNTER — Ambulatory Visit (INDEPENDENT_AMBULATORY_CARE_PROVIDER_SITE_OTHER)
Admission: RE | Admit: 2018-02-02 | Discharge: 2018-02-02 | Disposition: A | Discharge status: Home / Self Care | Payer: Medicare Other | Source: Ambulatory Visit | Attending: Adult Health | Admitting: Adult Health

## 2018-02-02 VITALS — BP 130/84 | HR 85 | Ht 65.5 in | Wt 152.0 lb

## 2018-02-02 DIAGNOSIS — R911 Solitary pulmonary nodule: Secondary | ICD-10-CM

## 2018-02-02 DIAGNOSIS — J45901 Unspecified asthma with (acute) exacerbation: Secondary | ICD-10-CM | POA: Diagnosis not present

## 2018-02-02 DIAGNOSIS — R058 Other specified cough: Secondary | ICD-10-CM

## 2018-02-02 DIAGNOSIS — R05 Cough: Secondary | ICD-10-CM | POA: Diagnosis not present

## 2018-02-02 NOTE — Progress Notes (Signed)
Chart and office note reviewed in detail  > agree with a/p as outlined    

## 2018-02-02 NOTE — Telephone Encounter (Signed)
Patient seen today by TP for med calendar Pt brought form from the [place] reporting that he is applying for [employer] stated that he did take this form to his PCP but was informed that the form should be brought to this office for MW to fill out/sign as pt's PCP "only does his blood pressure."  Looking at this form, it does indeed appear that PCP should have filled out this form as there is nothing pertaining to patient's lung function or any pulmonary diagnoses.  Placing forms with original envelope in MW's lookat for review Pt is aware we will call him once forms are complete/ready for pickup  Dr Melvyn Novas please advise, thank you

## 2018-02-02 NOTE — Assessment & Plan Note (Signed)
Check cxr today .  
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
## 2018-02-02 NOTE — Assessment & Plan Note (Signed)
Recent slow to resolve exacerbation.  Patient seems to be improved with Bevespi, recent steroid slow taper and cough control.  Since he has not had a flare off of prednisone we will continue off of steroids for now. Continue on cough prevention regimen with GERD treatment.  Plan  Patient Instructions  Remain off Omeprazole and Singulair .  Add Pepcid (this is over the counter ) At bedtime   Follow med calendar closely and bring to each visit .  Continue on BEVESPI 2 puffs Twice daily  , rinse after use.  Use Delsym cough syrup As needed  Cough .  Chest xray today  Follow up with Dr. Melvyn Novas  2 months and As needed

## 2018-02-02 NOTE — Assessment & Plan Note (Signed)
.  Patient's medications were reviewed today and patient education was given. Computerized medication calendar was adjusted/completed  Cont w/ current regimen -improved

## 2018-02-02 NOTE — Patient Instructions (Signed)
Remain off Omeprazole and Singulair .  Add Pepcid (this is over the counter ) At bedtime   Follow med calendar closely and bring to each visit .  Continue on BEVESPI 2 puffs Twice daily  , rinse after use.  Use Delsym cough syrup As needed  Cough .  Chest xray today  Follow up with Dr. Melvyn Novas  2 months and As needed

## 2018-02-04 ENCOUNTER — Encounter: Payer: Self-pay | Admitting: Internal Medicine

## 2018-02-04 ENCOUNTER — Ambulatory Visit (INDEPENDENT_AMBULATORY_CARE_PROVIDER_SITE_OTHER): Payer: Medicare Other | Admitting: Internal Medicine

## 2018-02-04 ENCOUNTER — Other Ambulatory Visit: Payer: Self-pay | Admitting: Adult Health

## 2018-02-04 VITALS — BP 154/88 | HR 92 | Ht 65.0 in | Wt 148.0 lb

## 2018-02-04 DIAGNOSIS — J4530 Mild persistent asthma, uncomplicated: Secondary | ICD-10-CM | POA: Diagnosis not present

## 2018-02-04 DIAGNOSIS — R05 Cough: Secondary | ICD-10-CM

## 2018-02-04 DIAGNOSIS — R911 Solitary pulmonary nodule: Secondary | ICD-10-CM | POA: Diagnosis not present

## 2018-02-04 DIAGNOSIS — R058 Other specified cough: Secondary | ICD-10-CM

## 2018-02-04 MED ORDER — ACETAMINOPHEN-CODEINE #3 300-30 MG PO TABS: 1.0000 | ORAL_TABLET | ORAL | 0 refills | Status: DC | PRN

## 2018-02-04 MED ORDER — PREDNISONE 10 MG PO TABS: ORAL_TABLET | ORAL | 0 refills | Status: DC

## 2018-02-04 NOTE — Patient Instructions (Addendum)
For severe cough > tylenol #3  One every 4 hours as needed   Prednisone 10 mg take  4 each am x 2 days,   2 each am x 2 days,  1 each am x 2 days and stop   See calendar for specific medication instructions and bring it back for each and every office visit for every healthcare provider you see.  Without it,  you may not receive the best quality medical care that we feel you deserve.  You will note that the calendar groups together  your maintenance  medications that are timed at particular times of the day.  Think of this as your checklist for what your doctor has instructed you to do until your next evaluation to see what benefit  there is  to staying on a consistent group of medications intended to keep you well.  The other group at the bottom is entirely up to you to use as you see fit  for specific symptoms that may arise between visits that require you to treat them on an as needed basis.  Think of this as your action plan or "what if" list.   Separating the top medications from the bottom group is fundamental to providing you adequate care going forward.     Please schedule a follow up office visit in 6 weeks, call sooner if needed with cxr on return  Add:   Instructions should say to taper pred to 10 mg daily and leave it there until return
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
## 2018-02-05 ENCOUNTER — Encounter: Payer: Self-pay | Admitting: Internal Medicine

## 2018-02-05 NOTE — Assessment & Plan Note (Signed)
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 > referred to [facility] for second opinion> trach December 16 2016 > Trach surgery 03/15/17   / trach out end of summer /early fall 2018  10/27/17 ENT f/u "Ok" airway  - 01/14/2018  C/w mod severe truncation of insp loop > rx gerd and referred back to [facility]  Sometimes just ok is not ok and I think this is one of them and may need trach back in place if can't do better than present - each time he takes a deep breath in to cough he gets "choked" with obvious stridor due to upper airway mechanics that occur in extrathoracic airway obst  which clearly are worse on insp vs  The exact opposite with intrathoracic mechanics.   For now rx with pred x 6 days/ tylenol #3 to suppress cough and refer back to [facility] asap

## 2018-02-05 NOTE — Assessment & Plan Note (Signed)
See cxr 01/10/18> not seen on f/u 01/13/18 but ? New LLL density likely these are all atx/ inflammatory in this never smoker   F/u with plain cxr is all that's needed here but can wait until we sort out his Upper Airway problem

## 2018-02-05 NOTE — Assessment & Plan Note (Addendum)
10/30/2016    try reduce symb to 80 2bid  - 01/21/2017  After extensive coaching HFA effectiveness =    75% via adaptor to trach  - Spirometry 12/14/2017  FEV1 1.59 (66%)  Ratio 65 after symb with poor hfa  - 12/14/2017  After extensive coaching inhaler device  effectiveness =    75% from a baseline of 25 % > continue symb 160 2bid  - PFT's  01/14/2018  FEV1 1.32 (51 % ) ratio 63  p 3 % improvement from saba p ? prior to study with DLCO  58/61 % corrects to 118  % for alv volume - 01/14/2018  After extensive coaching inhaler device  effectiveness =    75% try change to bevespi and floor dose of pred @ 10 mg daily   02/04/2018  After extensive coaching inhaler device  effectiveness =    75%   Clearly much worse when off prednisone so will  Given another 6 day taper and leave on 10 mg daily until return then consider change back to symb 160 as this is ab and not typical copd   I had an extended discussion with the patient reviewing all relevant studies completed to date and  lasting 15 to 20 minutes of a 25 minute visit    See device teaching which extended face to face time for this visit.  Each maintenance medication was reviewed in detail including emphasizing most importantly the difference between maintenance and prns and under what circumstances the prns are to be triggered using an action plan format that is not reflected in the computer generated alphabetically organized AVS which I have not found useful in most complex patients, especially with respiratory illnesses  Please see AVS for specific instructions unique to this visit that I personally wrote and verbalized to the the pt in detail and then reviewed with pt  by my nurse highlighting any  changes in therapy recommended at today's visit to their plan of care.

## 2018-02-08 ENCOUNTER — Telehealth: Payer: Self-pay | Admitting: Internal Medicine

## 2018-02-08 ENCOUNTER — Telehealth: Payer: Self-pay | Admitting: *Deleted

## 2018-02-08 ENCOUNTER — Other Ambulatory Visit: Payer: Self-pay | Admitting: Gastroenterology

## 2018-02-08 MED ORDER — PREDNISONE 10 MG PO TABS: 10.0000 mg | ORAL_TABLET | Freq: Every day | ORAL | 1 refills | Status: DC

## 2018-02-08 NOTE — Telephone Encounter (Addendum)
I called [facility] and the phone keep ringing with no answer and no voicemail. In the meanwhile MW would you be willing to sign medical clearance for colonoscopy procedure? Please advise.    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 > referred to [facility] for second opinion> trach December 16 2016 > Trach surgery 03/15/17   / trach out end of summer /early fall 2018  10/27/17 ENT f/u "Ok" airway  - 01/14/2018  C/w mod severe truncation of insp loop > rx gerd and referred back to [facility]  Sometimes just ok is not ok and I think this is one of them and may need trach back in place if can't do better than present - each time he takes a deep breath in to cough he gets "choked" with obvious stridor due to upper airway mechanics that occur in extrathoracic airway obst  which clearly are worse on insp vs  The exact opposite with intrathoracic mechanics.   For now rx with pred x 6 days/ tylenol #3 to suppress cough and refer back to [facility] asap    Instructions   For severe cough > tylenol #3  One every 4 hours as needed   Prednisone 10 mg take  4 each am x 2 days,   2 each am x 2 days,  1 each am x 2 days and stop

## 2018-02-08 NOTE — Telephone Encounter (Signed)
Called and spoke to patient and his daughter. Patient had trouble understanding the taper dose. Explained that he will take 4 tabs for 2 days, 2 tabs for 2 days, and 1 tab for 2 days per order. Patient's daughter verbalized understanding and reports no further questions. Nothing further is needed at this time.

## 2018-02-08 NOTE — Telephone Encounter (Signed)
-----   Message from Tanda Rockers, MD sent at 02/05/2018  6:24 AM EDT ----- Instructions should say to taper pred to 10 mg daily and leave it there until return

## 2018-02-08 NOTE — Telephone Encounter (Signed)
Spoke with [place] at [place], who advised that we just needed to type a letter stating sx clearance and fax to below verified fax #.    Letter typed, signed, and faxed to below fax #.  Nothing further needed.

## 2018-02-08 NOTE — Telephone Encounter (Signed)
That's fine

## 2018-02-09 ENCOUNTER — Other Ambulatory Visit: Payer: Self-pay | Admitting: Gastroenterology

## 2018-02-16 NOTE — Addendum Note (Signed)
Addended by: Della Goo C on: 02/16/2018 03:00 PM   Modules accepted: Orders

## 2018-02-18 NOTE — Telephone Encounter (Signed)
Done and pt notified 02/18/2018

## 2018-02-18 NOTE — Telephone Encounter (Signed)
Correction, I don't have any forms to complete

## 2018-02-20 ENCOUNTER — Other Ambulatory Visit: Payer: Self-pay | Admitting: Internal Medicine

## 2018-02-20 IMAGING — CR DG CHEST 2V
2 series · 2 of 2 positions shown · non-contrast
Comparison: 03/30/2016

CLINICAL DATA: Shortness of breath and wheezing, onset today while
walking home.

EXAM:
CHEST  2 VIEW

[w chest pa]
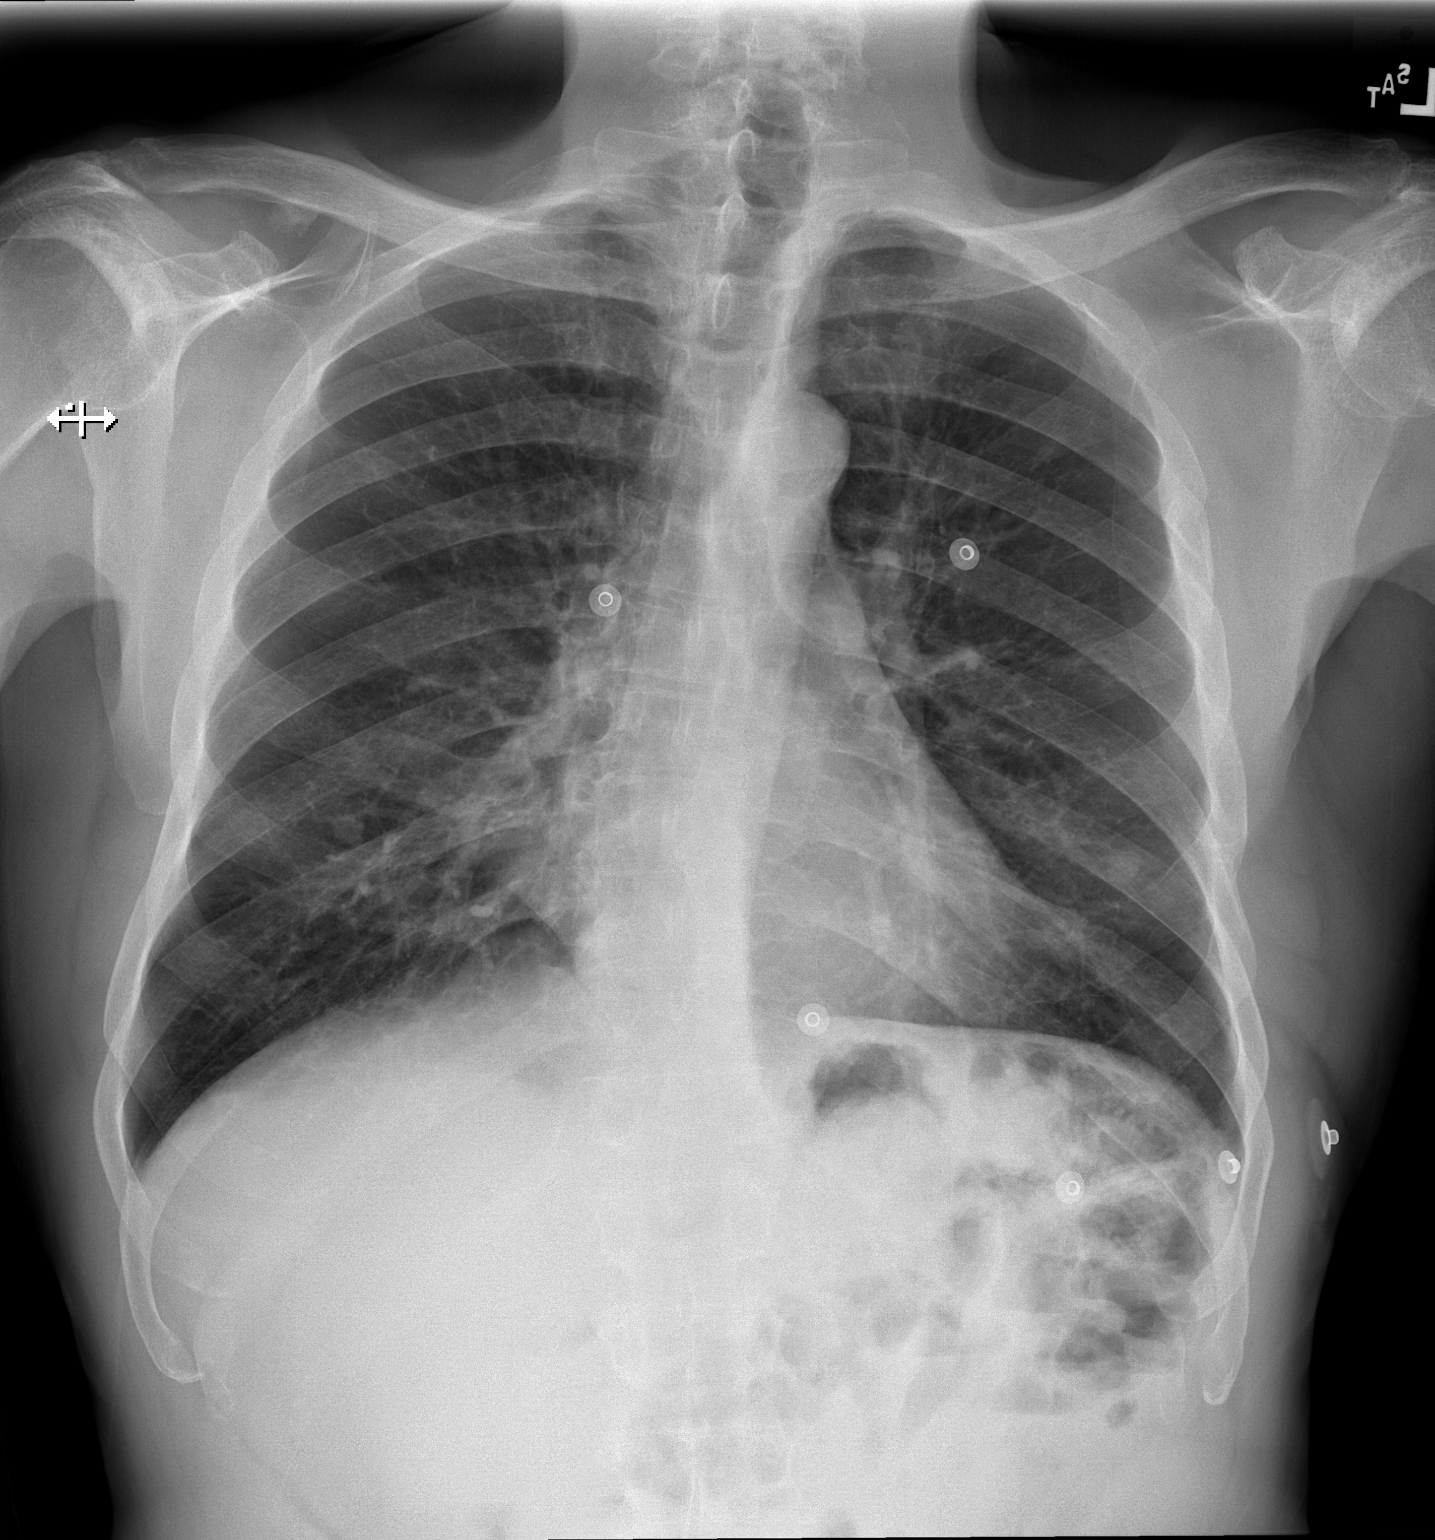

[w chest lat]
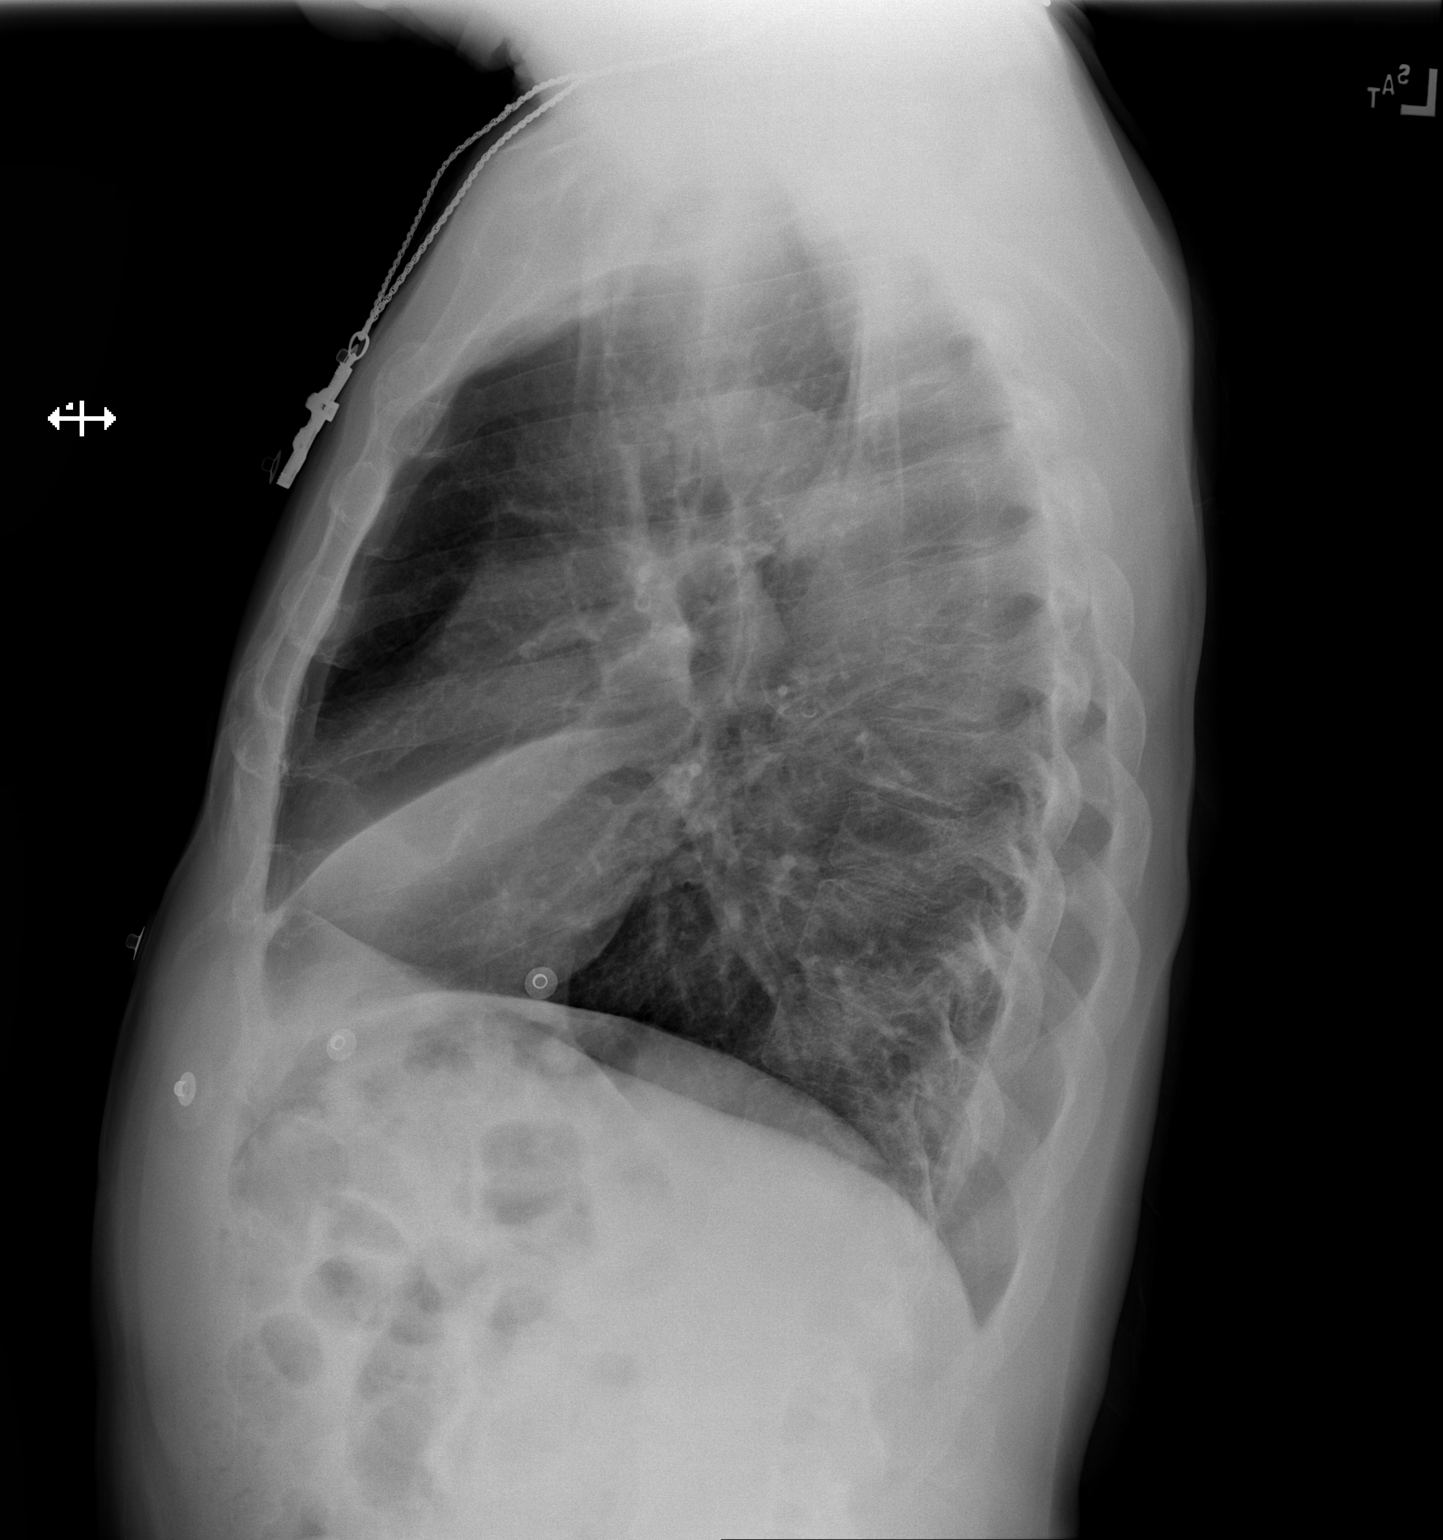

[2 of 2 positions shown; findings below may reference images not displayed]

FINDINGS: Normal heart size and pulmonary vascularity. Linear opacity over the
right mid lung likely to represent consolidation and atelectasis in
the right middle lung. Left lung is clear. Small nodular opacities
projected over both mid lungs are present on previous studies and
likely represent prominent nipple shadows. No blunting of
costophrenic angles. No pneumothorax. Mediastinal contours appear
intact. Mild calcification of the aorta.
IMPRESSION: Consolidation and atelectasis in the right middle lung likely to
represent pneumonia. Followup PA and lateral chest X-ray is
recommended in 3-4 weeks following trial of antibiotic therapy to
ensure resolution and exclude underlying malignancy.

## 2018-02-22 ENCOUNTER — Other Ambulatory Visit: Payer: Self-pay

## 2018-02-22 ENCOUNTER — Encounter (HOSPITAL_COMMUNITY): Payer: Self-pay

## 2018-02-23 NOTE — Anesthesia Preprocedure Evaluation (Addendum)
Anesthesia Evaluation  Patient identified by MRN, date of birth, ID band Patient awake    Reviewed: Allergy & Precautions, NPO status , Patient's Chart, lab work & pertinent test results  Airway Mallampati: II  TM Distance: >3 FB     Dental   Pulmonary asthma , COPD,    Pulmonary exam normal        Cardiovascular hypertension, Pt. on medications Normal cardiovascular exam     Neuro/Psych negative neurological ROS     GI/Hepatic negative GI ROS, Neg liver ROS,   Endo/Other  negative endocrine ROS  Renal/GU negative Renal ROS     Musculoskeletal   Abdominal   Peds  Hematology negative hematology ROS (+)   Anesthesia Other Findings   Reproductive/Obstetrics                            Anesthesia Physical Anesthesia Plan  ASA: III  Anesthesia Plan: MAC   Post-op Pain Management:    Induction: Intravenous  PONV Risk Score and Plan: 1 and Propofol infusion and Treatment may vary due to age or medical condition  Airway Management Planned: Natural Airway and Nasal Cannula  Additional Equipment:   Intra-op Plan:   Post-operative Plan:   Informed Consent: I have reviewed the patients History and Physical, chart, labs and discussed the procedure including the risks, benefits and alternatives for the proposed anesthesia with the patient or authorized representative who has indicated his/her understanding and acceptance.     Plan Discussed with:   Anesthesia Plan Comments:        Anesthesia Quick Evaluation

## 2018-02-24 ENCOUNTER — Encounter (HOSPITAL_COMMUNITY)
Admission: RE | Disposition: A | Discharge status: Home / Self Care | Payer: Self-pay | Source: Ambulatory Visit | Attending: Gastroenterology

## 2018-02-24 ENCOUNTER — Ambulatory Visit (HOSPITAL_COMMUNITY): Payer: Medicare Other | Admitting: Anesthesiology

## 2018-02-24 ENCOUNTER — Ambulatory Visit (HOSPITAL_COMMUNITY)
Admission: RE | Admit: 2018-02-24 | Discharge: 2018-02-24 | Disposition: A | Discharge status: Home / Self Care | Payer: Medicare Other | Source: Ambulatory Visit | Attending: Gastroenterology | Admitting: Gastroenterology

## 2018-02-24 ENCOUNTER — Other Ambulatory Visit: Payer: Self-pay

## 2018-02-24 ENCOUNTER — Encounter (HOSPITAL_COMMUNITY): Payer: Self-pay | Admitting: *Deleted

## 2018-02-24 DIAGNOSIS — J449 Chronic obstructive pulmonary disease, unspecified: Secondary | ICD-10-CM | POA: Insufficient documentation

## 2018-02-24 DIAGNOSIS — Z79899 Other long term (current) drug therapy: Secondary | ICD-10-CM | POA: Diagnosis not present

## 2018-02-24 DIAGNOSIS — Q438 Other specified congenital malformations of intestine: Secondary | ICD-10-CM | POA: Diagnosis not present

## 2018-02-24 DIAGNOSIS — D122 Benign neoplasm of ascending colon: Secondary | ICD-10-CM | POA: Insufficient documentation

## 2018-02-24 DIAGNOSIS — F419 Anxiety disorder, unspecified: Secondary | ICD-10-CM | POA: Diagnosis not present

## 2018-02-24 DIAGNOSIS — M1990 Unspecified osteoarthritis, unspecified site: Secondary | ICD-10-CM | POA: Insufficient documentation

## 2018-02-24 DIAGNOSIS — K648 Other hemorrhoids: Secondary | ICD-10-CM | POA: Insufficient documentation

## 2018-02-24 DIAGNOSIS — Z86010 Personal history of colonic polyps: Secondary | ICD-10-CM | POA: Diagnosis not present

## 2018-02-24 DIAGNOSIS — Z1211 Encounter for screening for malignant neoplasm of colon: Secondary | ICD-10-CM | POA: Diagnosis present

## 2018-02-24 DIAGNOSIS — I10 Essential (primary) hypertension: Secondary | ICD-10-CM | POA: Diagnosis not present

## 2018-02-24 DIAGNOSIS — K635 Polyp of colon: Secondary | ICD-10-CM | POA: Insufficient documentation

## 2018-02-24 HISTORY — PX: POLYPECTOMY: SHX5525

## 2018-02-24 HISTORY — PX: COLONOSCOPY WITH PROPOFOL: SHX5780

## 2018-02-24 SURGERY — COLONOSCOPY WITH PROPOFOL
Anesthesia: Monitor Anesthesia Care

## 2018-02-24 MED ORDER — SODIUM CHLORIDE 0.9 % IV SOLN: INTRAVENOUS | Status: DC

## 2018-02-24 MED ORDER — PROPOFOL 10 MG/ML IV BOLUS
INTRAVENOUS | Status: AC
  Filled 2018-02-24: qty 80

## 2018-02-24 MED ORDER — PROPOFOL 500 MG/50ML IV EMUL
INTRAVENOUS | Status: DC | PRN
  Administered 2018-02-24: 50 mg via INTRAVENOUS

## 2018-02-24 MED ORDER — PROPOFOL 500 MG/50ML IV EMUL
INTRAVENOUS | Status: DC | PRN
  Administered 2018-02-24: 125 ug/kg/min via INTRAVENOUS

## 2018-02-24 MED ORDER — LACTATED RINGERS IV SOLN
INTRAVENOUS | Status: DC
  Administered 2018-02-24 (×2): via INTRAVENOUS

## 2018-02-24 MED ORDER — ONDANSETRON HCL 4 MG/2ML IJ SOLN
INTRAMUSCULAR | Status: DC | PRN
  Administered 2018-02-24: 4 mg via INTRAVENOUS

## 2018-02-24 SURGICAL SUPPLY — 22 items

## 2018-02-24 NOTE — Anesthesia Postprocedure Evaluation (Signed)
Anesthesia Post Note  Patient: [name] [name]  Procedure(s) Performed: COLONOSCOPY WITH PROPOFOL (N/A ) POLYPECTOMY     Patient location during evaluation: PACU Anesthesia Type: MAC Level of consciousness: awake and alert Pain management: pain level controlled Vital Signs Assessment: post-procedure vital signs reviewed and stable Respiratory status: spontaneous breathing, nonlabored ventilation, respiratory function stable and patient connected to nasal cannula oxygen Cardiovascular status: stable and blood pressure returned to baseline Postop Assessment: no apparent nausea or vomiting Anesthetic complications: no    Last Vitals:  Vitals:   02/24/18 0913 02/24/18 0920  BP: (!) 137/92 (!) 130/95  Pulse: 74 72  Resp: (!) 29 16  Temp:    SpO2: 98% 100%    Last Pain:  Vitals:   02/24/18 0920  TempSrc:   PainSc: 0-No pain                 Tiajuana Amass

## 2018-02-24 NOTE — Discharge Instructions (Signed)

## 2018-02-24 NOTE — Op Note (Addendum)
[facility] Patient Name: [name] [name] Procedure Date: 02/24/2018 MRN: [id number] Attending MD: Otis Brace , MD Date of Birth: March 09, 1951 CSN: [id number] Age: 67 Admit Type: Ambulatory Procedure:                Colonoscopy Indications:              Surveillance: Personal history of colonic polyps                            (unknown histology) on last colonoscopy more than 5                            years ago Providers:                Otis Brace, MD, Angus Seller, Laurena Spies, Technician Referring MD:              Medicines:                Sedation Administered by an Anesthesia Professional Complications:            No immediate complications. Estimated Blood Loss:     Estimated blood loss was minimal. Procedure:                Pre-Anesthesia Assessment:                           - Prior to the procedure, a History and Physical                            was performed, and patient medications and                            allergies were reviewed. The patient's tolerance of                            previous anesthesia was also reviewed. The risks                            and benefits of the procedure and the sedation                            options and risks were discussed with the patient.                            All questions were answered, and informed consent                            was obtained. Prior Anticoagulants: The patient has                            taken no previous anticoagulant or antiplatelet                            agents. ASA  Grade Assessment: III - A patient with                            severe systemic disease. After reviewing the risks                            and benefits, the patient was deemed in                            satisfactory condition to undergo the procedure.                           After obtaining informed consent, the colonoscope   was passed under direct vision. Throughout the                            procedure, the patient's blood pressure, pulse, and                            oxygen saturations were monitored continuously. The                            EC-[id number] ([id number]) scope was introduced through                            the anus and advanced to the the cecum, identified                            by appendiceal orifice and ileocecal valve. The                            colonoscopy was technically difficult and complex                            due to a redundant colon. Successful completion of                            the procedure was aided by applying abdominal                            pressure. The patient tolerated the procedure well.                            The quality of the bowel preparation was good                            except the ascending colon was fair. Scope In: 8:15:01 AM Scope Out: 8:48:04 AM Total Procedure Duration: 0 hours 33 minutes 3 seconds  Findings:      The perianal and digital rectal examinations were normal.      A 3 mm polyp was found in the proximal ascending colon. The polyp was       sessile. The polyp was removed with a cold biopsy forceps. Resection and       retrieval were  complete.      A 5 mm polyp was found in the distal ascending colon. The polyp was       sessile. The polyp was removed with a cold snare. Resection and       retrieval were complete.      A 6 mm polyp was found in the proximal transverse colon. The polyp was       semi-pedunculated. The polyp was removed with a cold snare. Resection       and retrieval were complete.      A 2 mm polyp was found in the distal transverse colon. The polyp was       flat. The polyp was removed with a cold biopsy forceps. Resection and       retrieval were complete.      A few hyperplastic polyps were found in the distal sigmoid colon. The       polyps were 3 to 8 mm in size. 3 to 4 polyps were removed  with a cold       snare and Biopsy forceps. Resection and retrieval were complete.      Internal hemorrhoids were found during retroflexion. The hemorrhoids       were small. Impression:               - One 3 mm polyp in the proximal ascending colon,                            removed with a cold biopsy forceps. Resected and                            retrieved.                           - One 5 mm polyp in the distal ascending colon,                            removed with a cold snare. Resected and retrieved.                           - One 6 mm polyp in the proximal transverse colon,                            removed with a cold snare. Resected and retrieved.                           - One 2 mm polyp in the distal transverse colon,                            removed with a cold biopsy forceps. Resected and                            retrieved.                           - A few 3 to 8 mm polyps in the distal sigmoid  colon, 3 to 4 polyps removed with a cold snare.                            Resected and retrieved.                           - Internal hemorrhoids. Moderate Sedation:      Moderate (conscious) sedation was personally administered by an       anesthesia professional. The following parameters were monitored: oxygen       saturation, heart rate, blood pressure, and response to care. Recommendation:           - Patient has a contact number available for                            emergencies. The signs and symptoms of potential                            delayed complications were discussed with the                            patient. Return to normal activities tomorrow.                            Written discharge instructions were provided to the                            patient.                           - Resume previous diet.                           - Continue present medications.                           - Await pathology results.                            - Repeat colonoscopy date to be determined after                            pending pathology results are reviewed for                            surveillance based on pathology results.                           - Return to my office PRN. Procedure Code(s):        --- Professional ---                           [phone], Colonoscopy, flexible; with removal of                            tumor(s), polyp(s), or other lesion(s) by snare  technique                           [id number], 59, Colonoscopy, flexible; with biopsy,                            single or multiple Diagnosis Code(s):        --- Professional ---                           Z86.010, Personal history of colonic polyps                           D12.2, Benign neoplasm of ascending colon                           D12.3, Benign neoplasm of transverse colon (hepatic                            flexure or splenic flexure)                           D12.5, Benign neoplasm of sigmoid colon                           K64.8, Other hemorrhoids CPT copyright 2017 [employer]. All rights reserved. The codes documented in this report are preliminary and upon coder review may  be revised to meet current compliance requirements. Otis Brace, MD Otis Brace, MD 02/24/2018 9:06:49 AM Number of Addenda: 0

## 2018-02-24 NOTE — Transfer of Care (Signed)
Immediate Anesthesia Transfer of Care Note  Patient: [name] [name]  Procedure(s) Performed: Procedure(s): COLONOSCOPY WITH PROPOFOL (N/A) POLYPECTOMY  Patient Location: PACU  Anesthesia Type:MAC  Level of Consciousness:  sedated, patient cooperative and responds to stimulation  Airway & Oxygen Therapy:Patient Spontanous Breathing and Patient connected to face mask oxgen  Post-op Assessment:  Report given to PACU RN and Post -op Vital signs reviewed and stable  Post vital signs:  Reviewed and stable  Last Vitals:  Vitals:   02/24/18 0655  BP: (!) 156/79  Pulse: 64  Resp: (!) 21  Temp: 36.5 C  SpO2: 09%    Complications: No apparent anesthesia complications

## 2018-02-24 NOTE — H&P (Signed)
Primary Care Physician:  [name], MD Primary Gastroenterologist:  Dr. Alessandra Bevels  Reason for visit :  surveillance colonoscopy  HPI: [name] [name] is a 67 y.o. 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 Colonoscopy in March 2013 at [facility] showed multiple tubular adenoma. Repeat was recommended in 3 years. Patient denies any GI symptoms today. He denies any active chest pain or Shortness of breath.  Past Medical History:  Diagnosis Date  . Abnormal EKG 12/24/11   anteroseptal and lateral ST elevation, felt r/t early repolarization;  Cardiac cath 12/24/11 - normal coronary anatomy, EF 55-65%  . Anxiety   . Arthritis   . Asthma   . Bradycardia, sinus 12/24/11  . COPD (chronic obstructive pulmonary disease) ([place])   . Dyspnea   . H/O tracheostomy   . History of alcohol abuse    hospitalized for detox 2002  . HTN (hypertension)   . Hypotension 12/24/11   in the setting of dehydration   . Marijuana use   . Pneumonia   . Syncope and collapse 12/24/11   2/2 hypotension in the setting of dehydration  . Upper airway cough syndrome     Past Surgical History:  Procedure Laterality Date  . CARDIAC CATHETERIZATION  12/24/11   normal coronary anatomy, EF 55-65%  . CIRCUMCISION  1972  . ESOPHAGOGASTRODUODENOSCOPY N/A 10/09/2015   Procedure: ESOPHAGOGASTRODUODENOSCOPY (EGD);  Surgeon: Judeth Horn, MD;  Location: [facility] ENDOSCOPY;  Service: General;  Laterality: N/A;  . IRRIGATION AND DEBRIDEMENT KNEE Right 05/23/2017   Procedure: IRRIGATION AND DEBRIDEMENT KNEE;  Surgeon: Wylene Simmer, MD;  Location: [facility] ORS;  Service: Orthopedics;  Laterality: Right;  . LACERATION REPAIR  02/2004   arthroscopic debridement of triagular fibrocartilage tear/E-chart; right wrist  . LEFT HEART CATHETERIZATION WITH CORONARY ANGIOGRAM N/A 12/24/2011   Procedure: LEFT HEART  CATHETERIZATION WITH CORONARY ANGIOGRAM;  Surgeon: Peter M Martinique, MD;  Location: [facility] CATH LAB;  Service: Cardiovascular;  Laterality: N/A;  . PEG PLACEMENT N/A 10/09/2015   Procedure: PERCUTANEOUS ENDOSCOPIC GASTROSTOMY (PEG) PLACEMENT;  Surgeon: Judeth Horn, MD;  Location: [place];  Service: General;  Laterality: N/A;  . PERCUTANEOUS TRACHEOSTOMY N/A 10/09/2015   Procedure: PERCUTANEOUS TRACHEOSTOMY;  Surgeon: Judeth Horn, MD;  Location: [place];  Service: General;  Laterality: N/A;    Prior to Admission medications   Medication Sig Start Date End Date Taking? Authorizing Provider  albuterol (PROVENTIL HFA;VENTOLIN HFA) 108 (90 Base) MCG/ACT inhaler Inhale 2 puffs into the lungs every 6 (six) hours as needed for wheezing or shortness of breath. 01/11/18  Yes Thurnell Lose, MD  albuterol (PROVENTIL) (2.5 MG/3ML) 0.083% nebulizer solution Take 3-6 mLs (2.5-5 mg total) by nebulization every 4 (four) hours as needed for wheezing or shortness of breath. 03/12/15  Yes Molpus, John, MD  cetirizine (ZYRTEC) 10 MG tablet Take 10 mg by mouth daily as needed for allergies.  09/11/16  Yes [provider]  famotidine (PEPCID) 20 MG tablet Take 1 tablet (20 mg total) by mouth at bedtime. One at bedtime 01/14/18  Yes Tanda Rockers, MD  famotidine (PEPCID) 20 MG tablet TAKE 1 TABLET BY MOUTH AT BEDTIME 02/21/18  Yes Tanda Rockers, MD  finasteride (PROSCAR) 5 MG tablet Take 5 mg by mouth daily.    Yes [provider]  fluticasone (FLONASE) 50 MCG/ACT nasal spray Place 2 sprays into both nostrils daily as needed for allergies.  Yes [provider]  Glycopyrrolate-Formoterol (BEVESPI AEROSPHERE) 9-4.8 MCG/ACT AERO Inhale 2 puffs into the lungs 2 (two) times daily. 01/14/18  Yes Tanda Rockers, MD  hydrochlorothiazide (MICROZIDE) 12.5 MG capsule Take 12.5 mg by mouth daily.   Yes [provider]  Multiple Vitamin (MULTIVITAMIN) tablet Take 1 tablet by mouth daily.   Yes  [provider]  Naphazoline HCl (CLEAR EYES OP) Place 1 drop into both eyes daily.   Yes [provider]  pantoprazole (PROTONIX) 40 MG tablet Take 1 tablet (40 mg total) by mouth daily. Take 30-60 min before first meal of the day 12/14/17  Yes Tanda Rockers, MD  acetaminophen-codeine (TYLENOL #3) 300-30 MG tablet Take 1 tablet by mouth every 4 (four) hours as needed (cough). Patient not taking: Reported on 02/21/2018 01/14/18   Tanda Rockers, MD    Scheduled Meds: Continuous Infusions: . lactated ringers 20 mL/hr at 02/24/18 0701   PRN Meds:.  Allergies as of 02/08/2018  . (No Known Allergies)    Family History  Problem Relation Age of Onset  . COPD Sister   . Cancer Sister   . Asthma Other     Social History   Socioeconomic History  . Marital status: Single    Spouse name: Not on file  . Number of children: Not on file  . Years of education: Not on file  . Highest education level: Not on file  Occupational History  . Occupation: retired  Scientific laboratory technician  . Financial resource strain: Not on file  . Food insecurity:    Worry: Not on file    Inability: Not on file  . Transportation needs:    Medical: Not on file    Non-medical: Not on file  Tobacco Use  . Smoking status: Never Smoker  . Smokeless tobacco: Never Used  Substance and Sexual Activity  . Alcohol use: Yes    Alcohol/week: 0.0 oz    Comment: 24 ounce on Monday and Friday  . Drug use: No    Types: Marijuana    Comment: Former heavy marijuana smoker, quit 6-7 months ago  . Sexual activity: Never    Birth control/protection: Condom  Lifestyle  . Physical activity:    Days per week: Not on file    Minutes per session: Not on file  . Stress: Not on file  Relationships  . Social connections:    Talks on phone: Not on file    Gets together: Not on file    Attends religious service: Not on file    Active member of club or organization: Not on file    Attends meetings of clubs or  organizations: Not on file    Relationship status: Not on file  . Intimate partner violence:    Fear of current or ex partner: Not on file    Emotionally abused: Not on file    Physically abused: Not on file    Forced sexual activity: Not on file  Other Topics Concern  . Not on file  Social History Narrative   ** Merged History Encounter **       He was in foster homes as a child because of his mother's ETOH abuse. Pt also has a history of ETOH abuse and was hospitalized in 2002 for detox.    Physical Exam: Vital signs: Vitals:   02/24/18 0655  BP: (!) 156/79  Pulse: 64  Resp: (!) 21  Temp: 97.7 F (36.5 C)  SpO2: 97%  General:   Alert,  Well-developed, well-nourished, pleasant and cooperative in NAD Lungs:  Occasional wheezing. No respiratory distress. Heart:  Regular rate and rhythm; no murmurs, clicks, rubs,  or gallops. Abdomen: soft, nontender, nondistended, bowel sounds present.  GI:  Lab Results: No results for input(s): WBC, HGB, HCT, PLT in the last 72 hours. BMET No results for input(s): NA, K, CL, CO2, GLUCOSE, BUN, CREATININE, CALCIUM in the last 72 hours. LFT No results for input(s): PROT, ALBUMIN, AST, ALT, ALKPHOS, BILITOT, BILIDIR, IBILI in the last 72 hours. PT/INR No results for input(s): LABPROT, INR in the last 72 hours.   Studies/Results: No results found.  Impression/Plan: - personal history of adenomatous polyps.  Recommendations ------------------------- - Procedure with colonoscopy.  Risks (bleeding, infection, bowel perforation that could require surgery, sedation-related changes in cardiopulmonary systems), benefits (identification and possible treatment of source of symptoms, exclusion of certain causes of symptoms), and alternatives (watchful waiting, radiographic imaging studies, empiric medical treatment)  were explained to patient in detail and patient wishes to proceed.   LOS: 0 days   Otis Brace  MD, FACP 02/24/2018,  7:59 AM  Contact #  [phone]

## 2018-02-25 ENCOUNTER — Encounter (HOSPITAL_COMMUNITY): Payer: Self-pay | Admitting: Gastroenterology

## 2018-03-06 ENCOUNTER — Inpatient Hospital Stay (HOSPITAL_COMMUNITY)
Admission: EM | Admit: 2018-03-06 | Discharge: 2018-03-08 | DRG: 192 | Disposition: A | Discharge status: Home / Self Care | Payer: Medicare Other | Attending: Internal Medicine | Admitting: Internal Medicine

## 2018-03-06 ENCOUNTER — Emergency Department (HOSPITAL_COMMUNITY): Payer: Medicare Other

## 2018-03-06 ENCOUNTER — Encounter (HOSPITAL_COMMUNITY): Payer: Self-pay

## 2018-03-06 ENCOUNTER — Other Ambulatory Visit: Payer: Self-pay

## 2018-03-06 ENCOUNTER — Inpatient Hospital Stay (HOSPITAL_COMMUNITY): Payer: Medicare Other

## 2018-03-06 DIAGNOSIS — E876 Hypokalemia: Secondary | ICD-10-CM | POA: Diagnosis present

## 2018-03-06 DIAGNOSIS — J441 Chronic obstructive pulmonary disease with (acute) exacerbation: Principal | ICD-10-CM | POA: Diagnosis present

## 2018-03-06 DIAGNOSIS — R0902 Hypoxemia: Secondary | ICD-10-CM | POA: Diagnosis present

## 2018-03-06 DIAGNOSIS — M1990 Unspecified osteoarthritis, unspecified site: Secondary | ICD-10-CM | POA: Diagnosis not present

## 2018-03-06 DIAGNOSIS — Z7951 Long term (current) use of inhaled steroids: Secondary | ICD-10-CM

## 2018-03-06 DIAGNOSIS — Z809 Family history of malignant neoplasm, unspecified: Secondary | ICD-10-CM | POA: Diagnosis not present

## 2018-03-06 DIAGNOSIS — N400 Enlarged prostate without lower urinary tract symptoms: Secondary | ICD-10-CM | POA: Diagnosis present

## 2018-03-06 DIAGNOSIS — J3802 Paralysis of vocal cords and larynx, bilateral: Secondary | ICD-10-CM

## 2018-03-06 DIAGNOSIS — I10 Essential (primary) hypertension: Secondary | ICD-10-CM | POA: Diagnosis present

## 2018-03-06 DIAGNOSIS — J3800 Paralysis of vocal cords and larynx, unspecified: Secondary | ICD-10-CM | POA: Diagnosis present

## 2018-03-06 DIAGNOSIS — Z825 Family history of asthma and other chronic lower respiratory diseases: Secondary | ICD-10-CM

## 2018-03-06 DIAGNOSIS — J4489 Other specified chronic obstructive pulmonary disease: Secondary | ICD-10-CM | POA: Diagnosis present

## 2018-03-06 DIAGNOSIS — J383 Other diseases of vocal cords: Secondary | ICD-10-CM | POA: Diagnosis not present

## 2018-03-06 DIAGNOSIS — R109 Unspecified abdominal pain: Secondary | ICD-10-CM

## 2018-03-06 DIAGNOSIS — J449 Chronic obstructive pulmonary disease, unspecified: Secondary | ICD-10-CM | POA: Diagnosis present

## 2018-03-06 LAB — CBC WITH DIFFERENTIAL/PLATELET
Basophils Absolute: 0 10*3/uL (ref 0.0–0.1)
Basophils Relative: 0 %
Eosinophils Absolute: 0.7 10*3/uL (ref 0.0–0.7)
Eosinophils Relative: 9 %
HCT: 39.3 % (ref 39.0–52.0)
Hemoglobin: 13.3 g/dL (ref 13.0–17.0)
LYMPHS ABS: 1.5 10*3/uL (ref 0.7–4.0)
Lymphocytes Relative: 19 %
MCH: 30.8 pg (ref 26.0–34.0)
MCHC: 33.8 g/dL (ref 30.0–36.0)
MCV: 91 fL (ref 78.0–100.0)
MONO ABS: 0.5 10*3/uL (ref 0.1–1.0)
Monocytes Relative: 6 %
NEUTROS ABS: 5 10*3/uL (ref 1.7–7.7)
Neutrophils Relative %: 66 %
Platelets: 320 10*3/uL (ref 150–400)
RBC: 4.32 MIL/uL (ref 4.22–5.81)
RDW: 13.6 % (ref 11.5–15.5)
WBC: 7.6 10*3/uL (ref 4.0–10.5)

## 2018-03-06 LAB — COMPREHENSIVE METABOLIC PANEL
ALT: 41 U/L (ref 17–63)
ANION GAP: 8 (ref 5–15)
AST: 28 U/L (ref 15–41)
Albumin: 3.5 g/dL (ref 3.5–5.0)
Alkaline Phosphatase: 66 U/L (ref 38–126)
BILIRUBIN TOTAL: 0.5 mg/dL (ref 0.3–1.2)
BUN: 12 mg/dL (ref 6–20)
CO2: 25 mmol/L (ref 22–32)
Calcium: 9 mg/dL (ref 8.9–10.3)
Chloride: 105 mmol/L (ref 101–111)
Creatinine, Ser: 0.76 mg/dL (ref 0.61–1.24)
Glucose, Bld: 122 mg/dL — ABNORMAL HIGH (ref 65–99)
POTASSIUM: 3.3 mmol/L — AB (ref 3.5–5.1)
Sodium: 138 mmol/L (ref 135–145)
TOTAL PROTEIN: 7.4 g/dL (ref 6.5–8.1)

## 2018-03-06 LAB — BRAIN NATRIURETIC PEPTIDE: B Natriuretic Peptide: 16.4 pg/mL (ref 0.0–100.0)

## 2018-03-06 LAB — PHOSPHORUS: Phosphorus: 3.7 mg/dL (ref 2.5–4.6)

## 2018-03-06 LAB — I-STAT TROPONIN, ED: Troponin i, poc: 0 ng/mL (ref 0.00–0.08)

## 2018-03-06 LAB — MAGNESIUM: Magnesium: 2 mg/dL (ref 1.7–2.4)

## 2018-03-06 MED ORDER — SODIUM CHLORIDE 0.9% FLUSH
3.0000 mL | Freq: Two times a day (BID) | INTRAVENOUS | Status: DC
  Administered 2018-03-07 (×2): 3 mL via INTRAVENOUS

## 2018-03-06 MED ORDER — PANTOPRAZOLE SODIUM 40 MG PO TBEC
40.0000 mg | DELAYED_RELEASE_TABLET | Freq: Every day | ORAL | Status: DC
  Administered 2018-03-06 – 2018-03-08 (×3): 40 mg via ORAL
  Filled 2018-03-06 (×3): qty 1

## 2018-03-06 MED ORDER — IPRATROPIUM-ALBUTEROL 0.5-2.5 (3) MG/3ML IN SOLN
3.0000 mL | Freq: Four times a day (QID) | RESPIRATORY_TRACT | Status: DC
  Administered 2018-03-06 – 2018-03-07 (×3): 3 mL via RESPIRATORY_TRACT
  Filled 2018-03-06 (×3): qty 3

## 2018-03-06 MED ORDER — SODIUM CHLORIDE 0.9% FLUSH: 3.0000 mL | INTRAVENOUS | Status: DC | PRN

## 2018-03-06 MED ORDER — MAGNESIUM SULFATE 2 GM/50ML IV SOLN
2.0000 g | Freq: Once | INTRAVENOUS | Status: AC
  Administered 2018-03-06: 2 g via INTRAVENOUS
  Filled 2018-03-06: qty 50

## 2018-03-06 MED ORDER — HYDROCODONE-HOMATROPINE 5-1.5 MG/5ML PO SYRP
10.0000 mL | ORAL_SOLUTION | Freq: Once | ORAL | Status: DC
  Filled 2018-03-06: qty 10

## 2018-03-06 MED ORDER — ONDANSETRON HCL 4 MG/2ML IJ SOLN: 4.0000 mg | Freq: Four times a day (QID) | INTRAMUSCULAR | Status: DC | PRN

## 2018-03-06 MED ORDER — METHYLPREDNISOLONE SODIUM SUCC 125 MG IJ SOLR
80.0000 mg | Freq: Four times a day (QID) | INTRAMUSCULAR | Status: DC
  Administered 2018-03-06 – 2018-03-07 (×4): 80 mg via INTRAVENOUS
  Filled 2018-03-06 (×4): qty 2

## 2018-03-06 MED ORDER — FINASTERIDE 5 MG PO TABS
5.0000 mg | ORAL_TABLET | Freq: Every day | ORAL | Status: DC
  Administered 2018-03-06 – 2018-03-08 (×3): 5 mg via ORAL
  Filled 2018-03-06 (×3): qty 1

## 2018-03-06 MED ORDER — DOXYCYCLINE HYCLATE 100 MG PO TABS
100.0000 mg | ORAL_TABLET | Freq: Two times a day (BID) | ORAL | Status: DC
  Administered 2018-03-06 – 2018-03-07 (×3): 100 mg via ORAL
  Filled 2018-03-06 (×3): qty 1

## 2018-03-06 MED ORDER — ENOXAPARIN SODIUM 40 MG/0.4ML ~~LOC~~ SOLN
40.0000 mg | SUBCUTANEOUS | Status: DC
  Administered 2018-03-06 – 2018-03-07 (×2): 40 mg via SUBCUTANEOUS
  Filled 2018-03-06 (×2): qty 0.4

## 2018-03-06 MED ORDER — SODIUM CHLORIDE 0.9 % IV SOLN: 250.0000 mL | INTRAVENOUS | Status: DC | PRN

## 2018-03-06 MED ORDER — ONDANSETRON HCL 4 MG PO TABS: 4.0000 mg | ORAL_TABLET | Freq: Four times a day (QID) | ORAL | Status: DC | PRN

## 2018-03-06 MED ORDER — ORAL CARE MOUTH RINSE
15.0000 mL | Freq: Two times a day (BID) | OROMUCOSAL | Status: DC
  Administered 2018-03-06: 15 mL via OROMUCOSAL

## 2018-03-06 MED ORDER — ALBUTEROL SULFATE (2.5 MG/3ML) 0.083% IN NEBU: 2.5000 mg | INHALATION_SOLUTION | RESPIRATORY_TRACT | Status: DC | PRN

## 2018-03-06 MED ORDER — POTASSIUM CHLORIDE CRYS ER 20 MEQ PO TBCR
40.0000 meq | EXTENDED_RELEASE_TABLET | Freq: Once | ORAL | Status: AC
  Administered 2018-03-06: 40 meq via ORAL
  Filled 2018-03-06: qty 2

## 2018-03-06 MED ORDER — ACETAMINOPHEN 325 MG PO TABS
650.0000 mg | ORAL_TABLET | Freq: Four times a day (QID) | ORAL | Status: DC | PRN
  Administered 2018-03-06 – 2018-03-07 (×3): 650 mg via ORAL
  Filled 2018-03-06 (×3): qty 2

## 2018-03-06 MED ORDER — MORPHINE SULFATE (PF) 2 MG/ML IV SOLN
1.0000 mg | INTRAVENOUS | Status: DC | PRN
Start: 2018-03-06 — End: 2018-03-08
  Administered 2018-03-06: 1 mg via INTRAVENOUS
  Filled 2018-03-06: qty 1

## 2018-03-06 MED ORDER — POLYETHYLENE GLYCOL 3350 17 G PO PACK: 17.0000 g | PACK | Freq: Every day | ORAL | Status: DC | PRN

## 2018-03-06 MED ORDER — DIPHENHYDRAMINE HCL 25 MG PO CAPS
25.0000 mg | ORAL_CAPSULE | Freq: Once | ORAL | Status: AC
  Administered 2018-03-06: 25 mg via ORAL
  Filled 2018-03-06: qty 1

## 2018-03-06 MED ORDER — HYDROCHLOROTHIAZIDE 12.5 MG PO CAPS
12.5000 mg | ORAL_CAPSULE | Freq: Every day | ORAL | Status: DC
  Administered 2018-03-06 – 2018-03-08 (×3): 12.5 mg via ORAL
  Filled 2018-03-06 (×3): qty 1

## 2018-03-06 MED ORDER — IPRATROPIUM-ALBUTEROL 0.5-2.5 (3) MG/3ML IN SOLN
3.0000 mL | Freq: Once | RESPIRATORY_TRACT | Status: AC
  Administered 2018-03-06: 3 mL via RESPIRATORY_TRACT
  Filled 2018-03-06: qty 3

## 2018-03-06 MED ORDER — FAMOTIDINE 20 MG PO TABS
20.0000 mg | ORAL_TABLET | Freq: Every day | ORAL | Status: DC
  Administered 2018-03-06 – 2018-03-07 (×2): 20 mg via ORAL
  Filled 2018-03-06 (×2): qty 1

## 2018-03-06 MED ORDER — ACETAMINOPHEN 650 MG RE SUPP: 650.0000 mg | Freq: Four times a day (QID) | RECTAL | Status: DC | PRN

## 2018-03-06 MED ORDER — ALBUTEROL SULFATE (2.5 MG/3ML) 0.083% IN NEBU
5.0000 mg | INHALATION_SOLUTION | Freq: Once | RESPIRATORY_TRACT | Status: DC
  Filled 2018-03-06: qty 6

## 2018-03-06 MED ORDER — ALBUTEROL (5 MG/ML) CONTINUOUS INHALATION SOLN
10.0000 mg/h | INHALATION_SOLUTION | Freq: Once | RESPIRATORY_TRACT | Status: AC
  Administered 2018-03-06: 10 mg/h via RESPIRATORY_TRACT
  Filled 2018-03-06: qty 20

## 2018-03-06 MED ORDER — IPRATROPIUM BROMIDE 0.02 % IN SOLN
1.0000 mg | Freq: Once | RESPIRATORY_TRACT | Status: AC
  Administered 2018-03-06: 1 mg via RESPIRATORY_TRACT
  Filled 2018-03-06: qty 5
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
## 2018-03-06 NOTE — ED Notes (Signed)
Attempted to call for report. 
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
## 2018-03-06 NOTE — Progress Notes (Signed)
   03/06/18 1345  Vitals  BP (!) 143/91  MAP (mmHg) 106  BP Method Automatic  Pulse Rate (!) 111  Oxygen Therapy  SpO2 99 %  Received call from secretary stating that patient was having a hard time breathing and in pain and needed assistance stat.  RN went into patient's room to find him bent over, grimacing and in extreme discomfort.  He said he was having horrible abdominal pain that was "cramp like." He stated he has never had pain like that before.  Prior to the abdominal pain, patient did complain of some foot cramps where he asked for mustard and ate 2 packets.  ~10 minutes later he was complaining of this severe abdominal pain that "started in the lower mid abdomen and radiated towards his left rib." MD paged. New orders received for IV pain medication x1 and abdominal x-ray. Will continue to monitor.

## 2018-03-06 NOTE — ED Notes (Signed)
Bed: W[facility]57 Expected date:  Expected time:  Means of arrival:  Comments: Resus A

## 2018-03-06 NOTE — ED Notes (Signed)
ED TO INPATIENT HANDOFF REPORT  Name/Age/Gender [name] [name] 67 y.o. male  Code Status Code Status History    Date Active Date Inactive Code Status Order ID Comments User Context   01/10/2018 1745 01/11/2018 1943 Full Code [id number]  Karmen Bongo, MD ED   05/23/2017 0414 05/24/2017 2003 Full Code [id number]  Wylene Simmer, MD Inpatient   10/19/2016 0704 10/20/2016 1443 Full Code [id number]  Edwin Dada, MD Inpatient   05/09/2016 0152 05/10/2016 1411 Full Code [id number]  Toy Baker, MD Inpatient   01/02/2016 1105 01/05/2016 1937 Full Code [id number]  Sid Falcon, MD ED   12/19/2015 2348 12/22/2015 1614 Full Code [id number]  Ivor Costa, MD ED   09/18/2015 2037 11/07/2015 1800 Full Code [id number]  Judeth Horn, MD ED   05/11/2015 1224 05/13/2015 1946 Full Code [id number]  Francesca Oman, DO Inpatient   04/02/2015 0927 04/03/2015 1552 Full Code [id number]  Quintella Baton, MD Inpatient      Home/SNF/Other Home  Chief Complaint shortness of breath   Level of Care/Admitting Diagnosis ED Disposition    ED Disposition Condition [facility] [facility] Area: [facility] [[id number]]  Level of Care: Telemetry [5]  Admit to tele based on following criteria: Other see comments  Comments: resp failure  Diagnosis: COPD exacerbation [facility]) [[id number]]  Admitting Physician: Dessa Phi [phone]  Attending Physician: Dessa Phi [phone]  Estimated length of stay: past midnight tomorrow  Certification:: I certify this patient will need inpatient services for at least 2 midnights  PT Class (Do Not Modify): Inpatient [101]  PT Acc Code (Do Not Modify): Private [1]       Medical History Past Medical History:  Diagnosis Date  . Abnormal EKG 12/24/11   anteroseptal and lateral ST elevation, felt r/t early repolarization;  Cardiac cath 12/24/11 - normal coronary anatomy, EF 55-65%  . Anxiety   . Arthritis   . Asthma   . Bradycardia, sinus 12/24/11  . COPD (chronic  obstructive pulmonary disease) ([place])   . Dyspnea   . H/O tracheostomy   . History of alcohol abuse    hospitalized for detox 2002  . HTN (hypertension)   . Hypotension 12/24/11   in the setting of dehydration   . Marijuana use   . Pneumonia   . Syncope and collapse 12/24/11   2/2 hypotension in the setting of dehydration  . Upper airway cough syndrome     Allergies No Known Allergies  IV Location/Drains/Wounds Patient Lines/Drains/Airways Status   Active Line/Drains/Airways    Name:   Placement date:   Placement time:   Site:   Days:   Peripheral IV 03/06/18 Left Forearm   03/06/18    0655    Forearm   less than 1   Incision (Closed) 05/23/17 Knee Right   05/23/17    0236     287   Incision (Closed) 05/23/17 Arm Right   05/23/17    0253     287   Incision (Closed) 05/23/17 Hand Right   05/23/17    0253     287   Incision (Closed) 05/23/17 Knee Left   05/23/17    0253     287   Incision (Closed) 05/23/17 Foot Left   05/23/17    0253     287          Labs/Imaging Results for orders placed or performed during the hospital encounter of 03/06/18 (from the past 48 hour(s))  Comprehensive metabolic panel  Status: Abnormal   Collection Time: 03/06/18  7:41 AM  Result Value Ref Range   Sodium 138 135 - 145 mmol/L   Potassium 3.3 (L) 3.5 - 5.1 mmol/L   Chloride 105 101 - 111 mmol/L   CO2 25 22 - 32 mmol/L   Glucose, Bld 122 (H) 65 - 99 mg/dL   BUN 12 6 - 20 mg/dL   Creatinine, Ser 0.76 0.61 - 1.24 mg/dL   Calcium 9.0 8.9 - 10.3 mg/dL   Total Protein 7.4 6.5 - 8.1 g/dL   Albumin 3.5 3.5 - 5.0 g/dL   AST 28 15 - 41 U/L   ALT 41 17 - 63 U/L   Alkaline Phosphatase 66 38 - 126 U/L   Total Bilirubin 0.5 0.3 - 1.2 mg/dL   GFR calc non Af Amer >60 >60 mL/min   GFR calc Af Amer >60 >60 mL/min    Comment: (NOTE) The eGFR has been calculated using the CKD EPI equation. This calculation has not been validated in all clinical situations. eGFR's persistently <60 mL/min signify  possible Chronic Kidney Disease.    Anion gap 8 5 - 15    Comment: Performed at [facility], [place] [address] [place], [place] [address]  CBC with Differential     Status: None   Collection Time: 03/06/18  7:41 AM  Result Value Ref Range   WBC 7.6 4.0 - 10.5 K/uL   RBC 4.32 4.22 - 5.81 MIL/uL   Hemoglobin 13.3 13.0 - 17.0 g/dL   HCT 39.3 39.0 - 52.0 %   MCV 91.0 78.0 - 100.0 fL   MCH 30.8 26.0 - 34.0 pg   MCHC 33.8 30.0 - 36.0 g/dL   RDW 13.6 11.5 - 15.5 %   Platelets 320 150 - 400 K/uL   Neutrophils Relative % 66 %   Neutro Abs 5.0 1.7 - 7.7 K/uL   Lymphocytes Relative 19 %   Lymphs Abs 1.5 0.7 - 4.0 K/uL   Monocytes Relative 6 %   Monocytes Absolute 0.5 0.1 - 1.0 K/uL   Eosinophils Relative 9 %   Eosinophils Absolute 0.7 0.0 - 0.7 K/uL   Basophils Relative 0 %   Basophils Absolute 0.0 0.0 - 0.1 K/uL    Comment: Performed at [facility], [place] [address], [place], [place] [address]  Magnesium     Status: None   Collection Time: 03/06/18  7:41 AM  Result Value Ref Range   Magnesium 2.0 1.7 - 2.4 mg/dL    Comment: Performed at [facility], [place] [address] [place], [place] [address]  Phosphorus     Status: None   Collection Time: 03/06/18  7:41 AM  Result Value Ref Range   Phosphorus 3.7 2.5 - 4.6 mg/dL    Comment: Performed at [facility], [place] [address] [place], [place] [address]  I-stat troponin, ED     Status: None   Collection Time: 03/06/18  7:52 AM  Result Value Ref Range   Troponin i, poc 0.00 0.00 - 0.08 ng/mL   Comment 3            Comment: Due to the release kinetics of cTnI, a negative result within the first hours of the onset of symptoms does not rule out myocardial infarction with certainty. If myocardial infarction is still suspected, repeat the test at appropriate intervals.    Dg Chest Portable 1 View  Result Date: 03/06/2018 CLINICAL DATA:  Shortness of breath.  History  of COPD. EXAM: PORTABLE CHEST 1 VIEW COMPARISON:  02/02/2018 FINDINGS: The cardiomediastinal silhouette is unchanged and within normal limits. The lungs are mildly hyperinflated. Patchy left basilar airspace opacity on the prior study has resolved. The lungs are also clear elsewhere. There is blunting scratched of there is new blunting of the left lateral costophrenic angle. No pneumothorax is identified. No acute osseous abnormality is seen. IMPRESSION: 1. Clear lungs. 2. Trace left pleural effusion. Electronically Signed   By: Logan Bores M.D.   On: 03/06/2018 07:41    Pending Labs Unresulted Labs (From admission, onward)   Start     Ordered   03/06/18 0712  Urinalysis, Routine w reflex microscopic  STAT,   STAT     03/06/18 0711   03/06/18 0711  Brain natriuretic peptide  Once,   STAT     03/06/18 0711   Signed and Held  HIV antibody (Routine Testing)  Once,   R     Signed and Held   Signed and Held  CBC  (enoxaparin (LOVENOX)    CrCl >/= 30 ml/min)  Once,   R    Comments:  Baseline for enoxaparin therapy IF NOT ALREADY DRAWN.  Notify MD if PLT < 100 K.    Signed and Held   Signed and Held  Creatinine, serum  (enoxaparin (LOVENOX)    CrCl >/= 30 ml/min)  Once,   R    Comments:  Baseline for enoxaparin therapy IF NOT ALREADY DRAWN.    Signed and Held   Signed and Held  Creatinine, serum  (enoxaparin (LOVENOX)    CrCl >/= 30 ml/min)  Weekly,   R    Comments:  while on enoxaparin therapy    Signed and Held   Signed and Held  CBC  Tomorrow morning,   R     Signed and Held   Signed and Held  Basic metabolic panel  Tomorrow morning,   R     Signed and Held      Vitals/Pain Today's Vitals   03/06/18 0653 03/06/18 0720 03/06/18 0815 03/06/18 0935  BP:    130/69  Pulse:    92  Resp:    (!) 25  Temp:      TempSrc:      SpO2:   94% 94%  Weight: 146 lb (66.2 kg)     Height: _0  (1.651 m)     PainSc: 0-No pain 0-No pain      Isolation Precautions No active  isolations  Medications Medications  albuterol (PROVENTIL) (2.5 MG/3ML) 0.083% nebulizer solution 5 mg (0 mg Nebulization Hold 03/06/18 0700)  HYDROcodone-homatropine (HYCODAN) 5-1.5 MG/5ML syrup 10 mL (10 mLs Oral Refused 03/06/18 0828)  ipratropium-albuterol (DUONEB) 0.5-2.5 (3) MG/3ML nebulizer solution 3 mL (3 mLs Nebulization Given 03/06/18 0705)  magnesium sulfate IVPB 2 g 50 mL (2 g Intravenous New Bag/Given 03/06/18 0750)  albuterol (PROVENTIL,VENTOLIN) solution continuous neb (10 mg/hr Nebulization Given 03/06/18 0815)  ipratropium (ATROVENT) nebulizer solution 1 mg (1 mg Nebulization Given 03/06/18 0814)    Mobility walks

## 2018-03-06 NOTE — ED Notes (Signed)
Bed: RESA Expected date:  Expected time:  Means of arrival:  Comments: 25 M Resp Distress
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
## 2018-03-07 DIAGNOSIS — N400 Enlarged prostate without lower urinary tract symptoms: Secondary | ICD-10-CM

## 2018-03-07 DIAGNOSIS — J383 Other diseases of vocal cords: Secondary | ICD-10-CM | POA: Diagnosis not present

## 2018-03-07 DIAGNOSIS — R0902 Hypoxemia: Secondary | ICD-10-CM | POA: Diagnosis not present

## 2018-03-07 DIAGNOSIS — J45901 Unspecified asthma with (acute) exacerbation: Secondary | ICD-10-CM | POA: Diagnosis not present

## 2018-03-07 DIAGNOSIS — J441 Chronic obstructive pulmonary disease with (acute) exacerbation: Secondary | ICD-10-CM | POA: Diagnosis not present

## 2018-03-07 DIAGNOSIS — I10 Essential (primary) hypertension: Secondary | ICD-10-CM | POA: Diagnosis not present

## 2018-03-07 LAB — CBC
HCT: 39.2 % (ref 39.0–52.0)
HEMOGLOBIN: 13.4 g/dL (ref 13.0–17.0)
MCH: 30.9 pg (ref 26.0–34.0)
MCHC: 34.2 g/dL (ref 30.0–36.0)
MCV: 90.5 fL (ref 78.0–100.0)
Platelets: 347 10*3/uL (ref 150–400)
RBC: 4.33 MIL/uL (ref 4.22–5.81)
RDW: 13.5 % (ref 11.5–15.5)
WBC: 9.9 10*3/uL (ref 4.0–10.5)

## 2018-03-07 LAB — HIV ANTIBODY (ROUTINE TESTING W REFLEX): HIV SCREEN 4TH GENERATION: NONREACTIVE

## 2018-03-07 LAB — BASIC METABOLIC PANEL
Anion gap: 10 (ref 5–15)
BUN: 13 mg/dL (ref 6–20)
CALCIUM: 9.9 mg/dL (ref 8.9–10.3)
CO2: 27 mmol/L (ref 22–32)
CREATININE: 0.72 mg/dL (ref 0.61–1.24)
Chloride: 101 mmol/L (ref 101–111)
GFR calc non Af Amer: >60 mL/min (ref >60)
Glucose, Bld: 157 mg/dL — ABNORMAL HIGH (ref 65–99)
Potassium: 4.2 mmol/L (ref 3.5–5.1)
SODIUM: 138 mmol/L (ref 135–145)

## 2018-03-07 MED ORDER — KETOROLAC TROMETHAMINE 30 MG/ML IJ SOLN
30.0000 mg | Freq: Once | INTRAMUSCULAR | Status: AC
  Administered 2018-03-07: 30 mg via INTRAVENOUS
  Filled 2018-03-07: qty 1

## 2018-03-07 MED ORDER — IPRATROPIUM-ALBUTEROL 0.5-2.5 (3) MG/3ML IN SOLN
3.0000 mL | Freq: Three times a day (TID) | RESPIRATORY_TRACT | Status: DC
  Administered 2018-03-07 (×3): 3 mL via RESPIRATORY_TRACT
  Filled 2018-03-07 (×3): qty 3

## 2018-03-07 MED ORDER — METOCLOPRAMIDE HCL 5 MG/ML IJ SOLN: 10.0000 mg | Freq: Once | INTRAMUSCULAR | Status: DC

## 2018-03-07 MED ORDER — METHYLPREDNISOLONE SODIUM SUCC 40 MG IJ SOLR: 40.0000 mg | Freq: Every day | INTRAMUSCULAR | Status: DC

## 2018-03-07 MED ORDER — SALINE SPRAY 0.65 % NA SOLN
1.0000 | NASAL | Status: DC | PRN
  Filled 2018-03-07: qty 44

## 2018-03-07 MED ORDER — DIPHENHYDRAMINE HCL 50 MG/ML IJ SOLN
25.0000 mg | Freq: Once | INTRAMUSCULAR | Status: AC
  Administered 2018-03-07: 25 mg via INTRAVENOUS
  Filled 2018-03-07: qty 1
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
## 2018-03-08 DIAGNOSIS — R0902 Hypoxemia: Secondary | ICD-10-CM | POA: Diagnosis not present

## 2018-03-08 DIAGNOSIS — N400 Enlarged prostate without lower urinary tract symptoms: Secondary | ICD-10-CM | POA: Diagnosis not present

## 2018-03-08 DIAGNOSIS — J45901 Unspecified asthma with (acute) exacerbation: Secondary | ICD-10-CM | POA: Diagnosis not present

## 2018-03-08 DIAGNOSIS — I10 Essential (primary) hypertension: Secondary | ICD-10-CM | POA: Diagnosis not present

## 2018-03-08 MED ORDER — PREDNISONE 20 MG PO TABS: 20.0000 mg | ORAL_TABLET | Freq: Every day | ORAL | 0 refills | Status: AC

## 2018-03-08 MED ORDER — PREDNISONE 20 MG PO TABS
20.0000 mg | ORAL_TABLET | Freq: Every day | ORAL | Status: DC
  Administered 2018-03-08: 20 mg via ORAL
  Filled 2018-03-08: qty 1
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
## 2018-03-14 DIAGNOSIS — J3802 Paralysis of vocal cords and larynx, bilateral: Secondary | ICD-10-CM | POA: Diagnosis not present

## 2018-03-14 DIAGNOSIS — E871 Hypo-osmolality and hyponatremia: Secondary | ICD-10-CM | POA: Diagnosis not present

## 2018-03-14 DIAGNOSIS — D141 Benign neoplasm of larynx: Secondary | ICD-10-CM | POA: Diagnosis not present

## 2018-03-14 DIAGNOSIS — J449 Chronic obstructive pulmonary disease, unspecified: Secondary | ICD-10-CM | POA: Diagnosis not present

## 2018-03-14 DIAGNOSIS — R430 Anosmia: Secondary | ICD-10-CM | POA: Diagnosis not present

## 2018-03-14 DIAGNOSIS — N400 Enlarged prostate without lower urinary tract symptoms: Secondary | ICD-10-CM | POA: Diagnosis not present

## 2018-03-14 DIAGNOSIS — J383 Other diseases of vocal cords: Secondary | ICD-10-CM | POA: Diagnosis not present

## 2018-03-14 DIAGNOSIS — Z79899 Other long term (current) drug therapy: Secondary | ICD-10-CM | POA: Diagnosis not present

## 2018-03-14 DIAGNOSIS — Z930 Tracheostomy status: Secondary | ICD-10-CM | POA: Diagnosis not present

## 2018-03-14 DIAGNOSIS — R0600 Dyspnea, unspecified: Secondary | ICD-10-CM | POA: Diagnosis not present

## 2018-03-14 DIAGNOSIS — R413 Other amnesia: Secondary | ICD-10-CM | POA: Diagnosis not present

## 2018-03-14 DIAGNOSIS — I10 Essential (primary) hypertension: Secondary | ICD-10-CM | POA: Diagnosis not present

## 2018-03-14 DIAGNOSIS — K219 Gastro-esophageal reflux disease without esophagitis: Secondary | ICD-10-CM | POA: Diagnosis not present

## 2018-03-14 DIAGNOSIS — Z430 Encounter for attention to tracheostomy: Secondary | ICD-10-CM | POA: Diagnosis not present

## 2018-03-14 DIAGNOSIS — R000 Tachycardia, unspecified: Secondary | ICD-10-CM | POA: Diagnosis not present

## 2018-03-14 DIAGNOSIS — R918 Other nonspecific abnormal finding of lung field: Secondary | ICD-10-CM | POA: Diagnosis not present

## 2018-03-14 DIAGNOSIS — R061 Stridor: Secondary | ICD-10-CM | POA: Diagnosis not present

## 2018-03-14 DIAGNOSIS — J386 Stenosis of larynx: Secondary | ICD-10-CM | POA: Diagnosis not present

## 2018-03-18 ENCOUNTER — Ambulatory Visit: Payer: Self-pay | Admitting: Internal Medicine

## 2018-03-21 DIAGNOSIS — E876 Hypokalemia: Secondary | ICD-10-CM | POA: Diagnosis not present

## 2018-03-21 DIAGNOSIS — R809 Proteinuria, unspecified: Secondary | ICD-10-CM | POA: Diagnosis not present

## 2018-03-21 DIAGNOSIS — J45901 Unspecified asthma with (acute) exacerbation: Secondary | ICD-10-CM | POA: Diagnosis not present

## 2018-03-21 DIAGNOSIS — J383 Other diseases of vocal cords: Secondary | ICD-10-CM | POA: Diagnosis not present

## 2018-03-24 IMAGING — CR DG CHEST 2V
2 series · 2 of 2 positions shown · non-contrast
Comparison: Chest radiograph performed 05/08/2016, and CT of the
chest performed 05/09/2016

CLINICAL DATA: Acute onset of shortness of breath and productive
cough. Initial encounter.

EXAM:
CHEST  2 VIEW

[w chest pa]
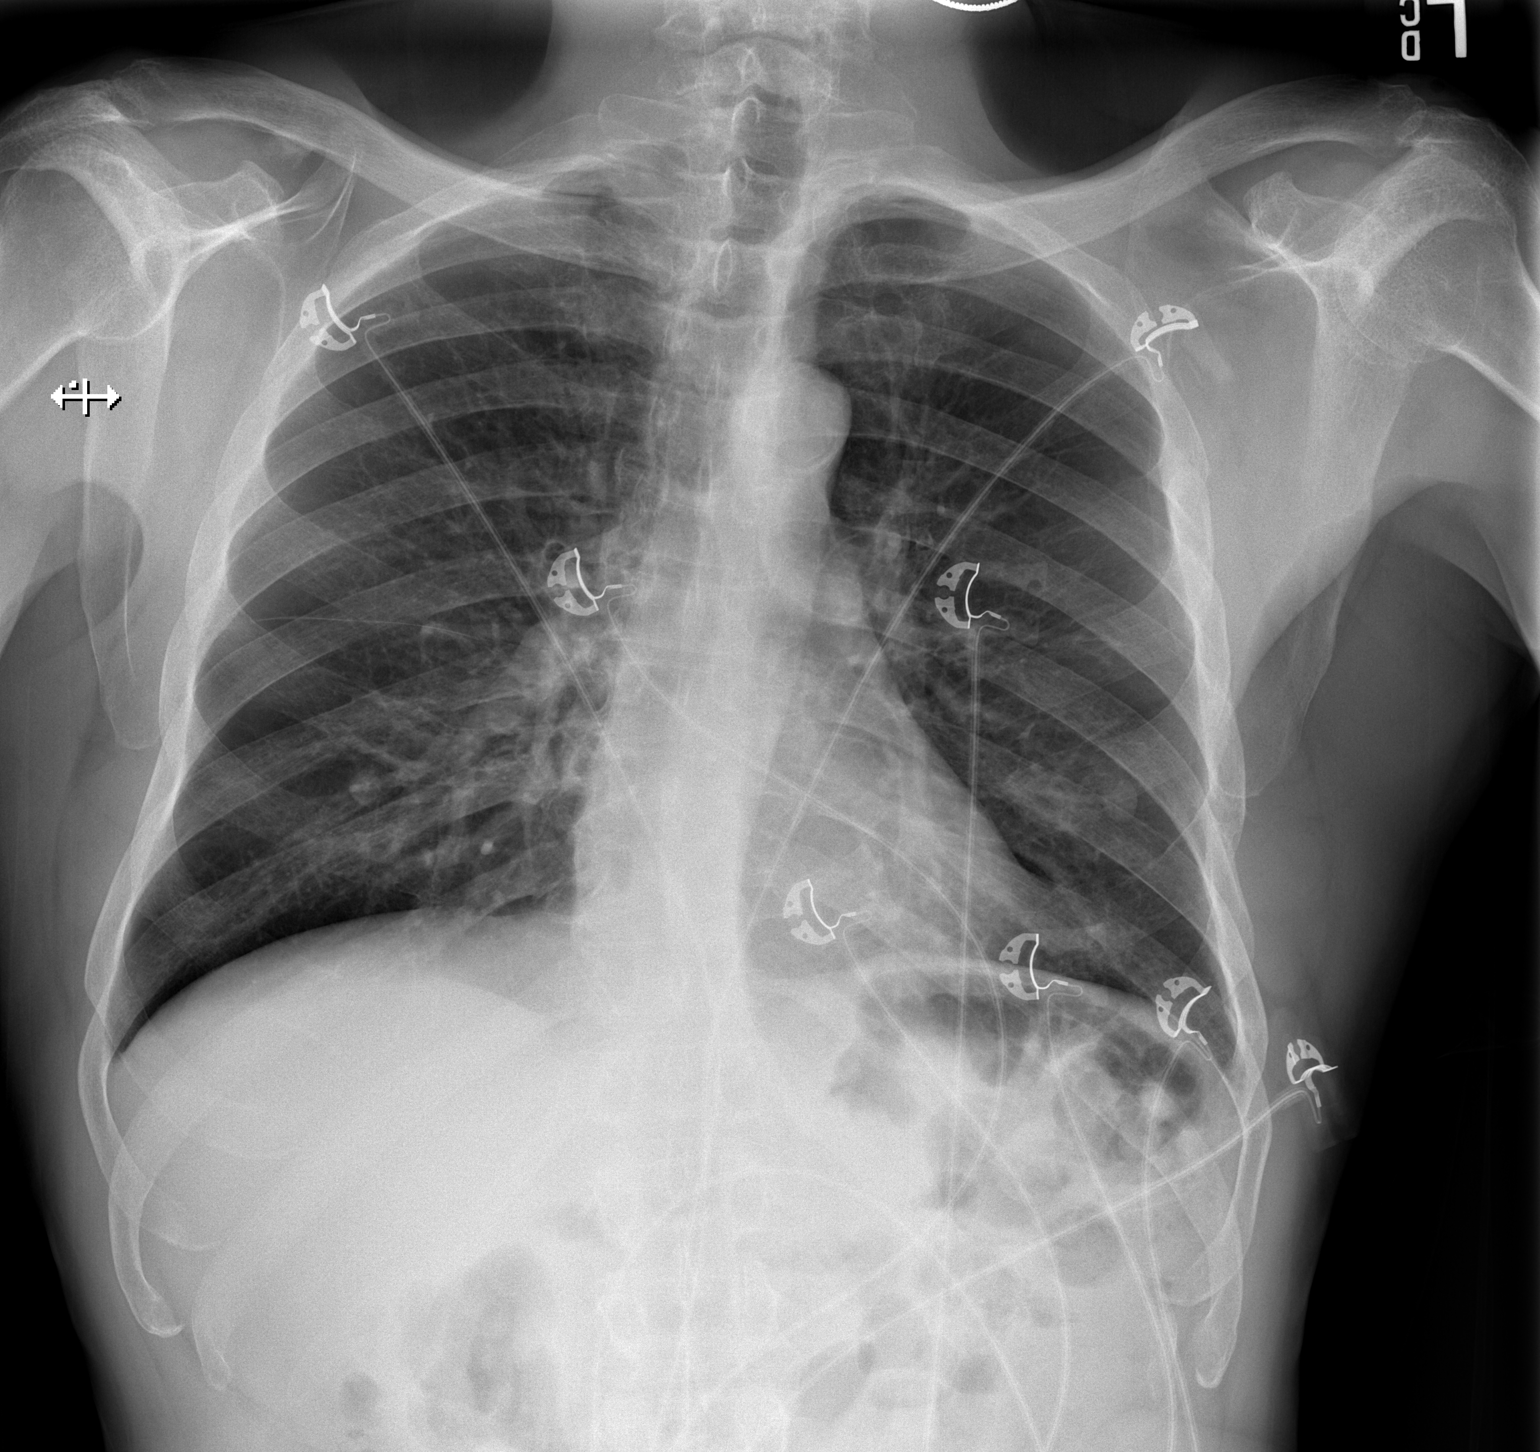

[w chest lat]
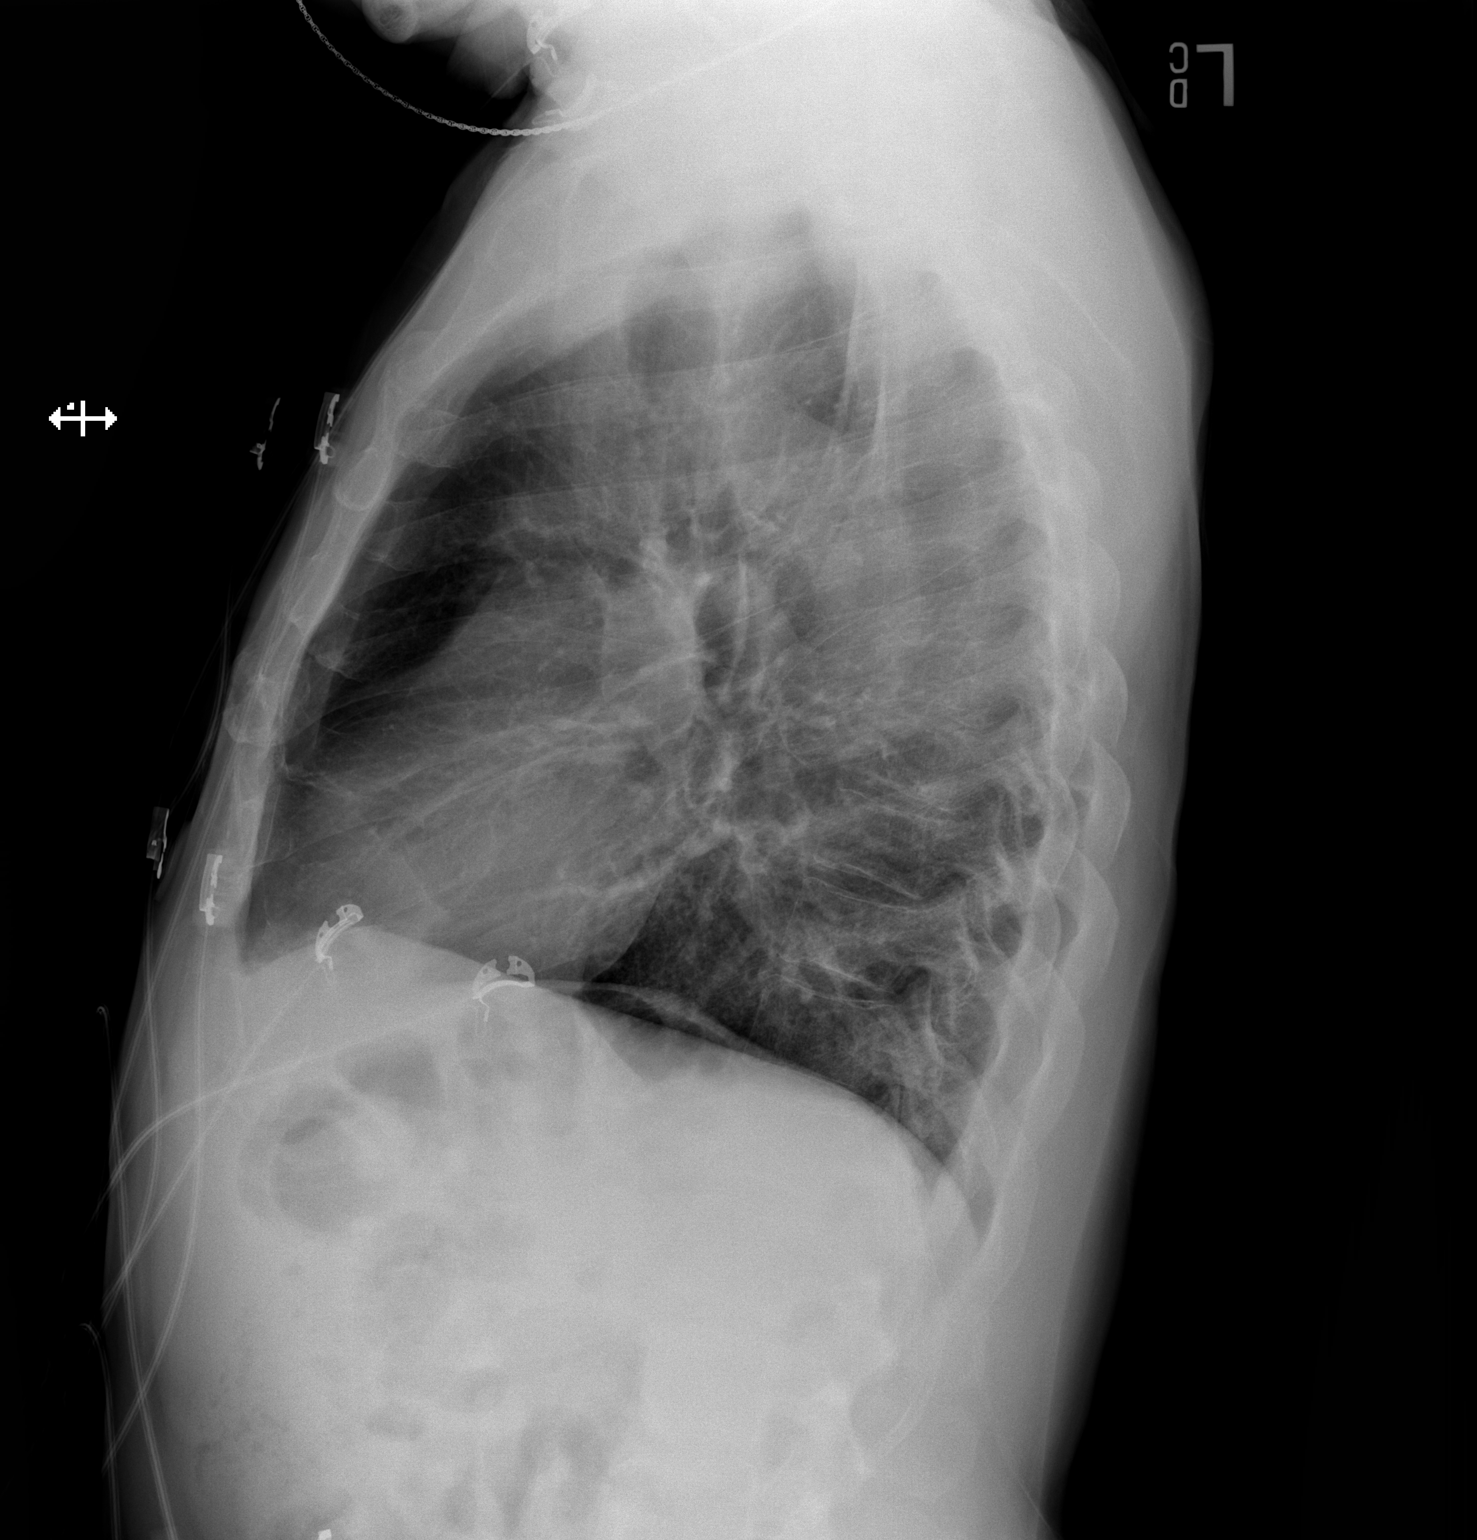

[2 of 2 positions shown; findings below may reference images not displayed]

FINDINGS: The lungs are well-aerated and clear. There is no evidence of focal
opacification, pleural effusion or pneumothorax. A left-sided nipple
shadow is seen.

The heart is normal in size; the mediastinal contour is within
normal limits. No acute osseous abnormalities are seen.
IMPRESSION: No acute cardiopulmonary process seen.

## 2018-03-31 DIAGNOSIS — J324 Chronic pansinusitis: Secondary | ICD-10-CM | POA: Diagnosis not present

## 2018-04-04 ENCOUNTER — Ambulatory Visit (INDEPENDENT_AMBULATORY_CARE_PROVIDER_SITE_OTHER): Payer: Medicare Other | Admitting: Internal Medicine

## 2018-04-04 ENCOUNTER — Encounter: Payer: Self-pay | Admitting: Internal Medicine

## 2018-04-04 VITALS — BP 104/68 | HR 81 | Ht 65.0 in | Wt 145.0 lb

## 2018-04-04 DIAGNOSIS — R05 Cough: Secondary | ICD-10-CM

## 2018-04-04 DIAGNOSIS — J3802 Paralysis of vocal cords and larynx, bilateral: Secondary | ICD-10-CM | POA: Diagnosis not present

## 2018-04-04 DIAGNOSIS — R058 Other specified cough: Secondary | ICD-10-CM

## 2018-04-04 NOTE — Patient Instructions (Signed)
No change in medications  - if get short of breath, take off the trach valve to reduce the resistance from the trach   Discuss what you need to do at night with your trach and humidifier with  Your ENT doctors   Please schedule a follow up visit in 3 months but call sooner if needed
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
## 2018-04-04 NOTE — Assessment & Plan Note (Addendum)
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 > referred to [facility] for second opinion> trach December 16 2016 > Trach surgery 03/15/17   / trach out end of summer /early fall 2018  10/27/17 ENT f/u "Ok" airway   - 01/14/2018  C/w mod severe truncation of insp loop > rx gerd and referred back to [facility]>  Re trach 03/14/18     Much better with trach back in but poor insight into use of PMV/ humidity > referred back to [facility]   I had an extended discussion with the patient reviewing all relevant studies completed to date and  lasting 15 to 20 minutes of a 25 minute visit    Each maintenance medication was reviewed in detail including most importantly the difference between maintenance and prns and under what circumstances the prns are to be triggered using an action plan format that is not reflected in the computer generated alphabetically organized AVS but trather by a customized med calendar that reflects the AVS meds with confirmed 100% correlation.   In addition, Please see AVS for unique instructions that I personally wrote and verbalized to the the pt in detail and then reviewed with pt  by my nurse highlighting any  changes in therapy recommended at today's visit to their plan of care.

## 2018-04-04 NOTE — Assessment & Plan Note (Signed)
s/p Trach 12/16/16 [facility] > removed early fall 2018 and placed back in 03/14/18 > f/u [facility]  Note with pmv out > 04/04/2018  Walked RA x 3 laps @ 185 ft each stopped due to  End of study, nl pace, no sob or desat     Pulmonary f/u can be q 3 months

## 2018-04-06 DIAGNOSIS — Z430 Encounter for attention to tracheostomy: Secondary | ICD-10-CM | POA: Diagnosis not present

## 2018-04-15 DIAGNOSIS — Z430 Encounter for attention to tracheostomy: Secondary | ICD-10-CM | POA: Diagnosis not present

## 2018-04-17 ENCOUNTER — Emergency Department (HOSPITAL_COMMUNITY): Payer: Medicare Other

## 2018-04-17 ENCOUNTER — Other Ambulatory Visit: Payer: Self-pay

## 2018-04-17 ENCOUNTER — Emergency Department (HOSPITAL_COMMUNITY)
Admission: EM | Admit: 2018-04-17 | Discharge: 2018-04-18 | Disposition: A | Discharge status: Home / Self Care | Payer: Medicare Other | Attending: Emergency Medicine | Admitting: Emergency Medicine

## 2018-04-17 ENCOUNTER — Encounter (HOSPITAL_COMMUNITY): Payer: Self-pay | Admitting: Emergency Medicine

## 2018-04-17 DIAGNOSIS — R05 Cough: Secondary | ICD-10-CM | POA: Diagnosis not present

## 2018-04-17 DIAGNOSIS — R062 Wheezing: Secondary | ICD-10-CM | POA: Diagnosis not present

## 2018-04-17 DIAGNOSIS — F1290 Cannabis use, unspecified, uncomplicated: Secondary | ICD-10-CM | POA: Diagnosis not present

## 2018-04-17 DIAGNOSIS — R0602 Shortness of breath: Secondary | ICD-10-CM | POA: Insufficient documentation

## 2018-04-17 DIAGNOSIS — F419 Anxiety disorder, unspecified: Secondary | ICD-10-CM | POA: Diagnosis not present

## 2018-04-17 DIAGNOSIS — Z9889 Other specified postprocedural states: Secondary | ICD-10-CM

## 2018-04-17 DIAGNOSIS — J45909 Unspecified asthma, uncomplicated: Secondary | ICD-10-CM | POA: Insufficient documentation

## 2018-04-17 DIAGNOSIS — R0902 Hypoxemia: Secondary | ICD-10-CM | POA: Diagnosis not present

## 2018-04-17 DIAGNOSIS — I10 Essential (primary) hypertension: Secondary | ICD-10-CM | POA: Insufficient documentation

## 2018-04-17 DIAGNOSIS — R61 Generalized hyperhidrosis: Secondary | ICD-10-CM | POA: Diagnosis not present

## 2018-04-17 DIAGNOSIS — Z79899 Other long term (current) drug therapy: Secondary | ICD-10-CM | POA: Diagnosis not present

## 2018-04-17 DIAGNOSIS — Z930 Tracheostomy status: Secondary | ICD-10-CM | POA: Diagnosis not present

## 2018-04-17 LAB — CBC
HCT: 39.1 % (ref 39.0–52.0)
Hemoglobin: 12.7 g/dL — ABNORMAL LOW (ref 13.0–17.0)
MCH: 30.8 pg (ref 26.0–34.0)
MCHC: 32.5 g/dL (ref 30.0–36.0)
MCV: 94.7 fL (ref 78.0–100.0)
Platelets: 239 10*3/uL (ref 150–400)
RBC: 4.13 MIL/uL — AB (ref 4.22–5.81)
RDW: 13.9 % (ref 11.5–15.5)
WBC: 7.1 10*3/uL (ref 4.0–10.5)
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
## 2018-04-18 LAB — BASIC METABOLIC PANEL
ANION GAP: 9 (ref 5–15)
BUN: 17 mg/dL (ref 8–23)
CALCIUM: 8.4 mg/dL — AB (ref 8.9–10.3)
CO2: 22 mmol/L (ref 22–32)
CREATININE: 0.91 mg/dL (ref 0.61–1.24)
Chloride: 100 mmol/L (ref 98–111)
GFR calc non Af Amer: >60 mL/min (ref >60)
Glucose, Bld: 119 mg/dL — ABNORMAL HIGH (ref 70–99)
Potassium: 4.8 mmol/L (ref 3.5–5.1)
SODIUM: 131 mmol/L — AB (ref 135–145)

## 2018-04-18 NOTE — Progress Notes (Signed)
Extra trach/trach supplies placed at bedside.

## 2018-04-18 NOTE — ED Notes (Signed)
ED Provider at bedside. 
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
## 2018-04-18 NOTE — Discharge Instructions (Addendum)
Follow-up with your physicians at [facility] on Tuesday as scheduled.

## 2018-04-18 NOTE — Progress Notes (Signed)
Pt placed on 28% ATC, pt with productive cough, returning small white/pinkish secretions. Pt states this is normal for him. No obvious respiratory distress noted at this time. Pt states he feels a lot better than when he first arrived. Pt given water at this time, okay'd by MD. RT will continue to monitor.

## 2018-04-18 NOTE — ED Notes (Signed)
Pt verbalizes understanding of d/c instructions. Pt ambulatory at d/c with all belongings.   

## 2018-04-19 ENCOUNTER — Emergency Department (HOSPITAL_COMMUNITY): Payer: Medicare Other

## 2018-04-19 ENCOUNTER — Emergency Department (HOSPITAL_COMMUNITY)
Admission: EM | Admit: 2018-04-19 | Discharge: 2018-04-20 | Disposition: A | Discharge status: Home / Self Care | Payer: Medicare Other | Attending: Emergency Medicine | Admitting: Emergency Medicine

## 2018-04-19 ENCOUNTER — Encounter (HOSPITAL_COMMUNITY): Payer: Self-pay

## 2018-04-19 DIAGNOSIS — Z79899 Other long term (current) drug therapy: Secondary | ICD-10-CM | POA: Diagnosis not present

## 2018-04-19 DIAGNOSIS — R0609 Other forms of dyspnea: Secondary | ICD-10-CM | POA: Diagnosis not present

## 2018-04-19 DIAGNOSIS — I10 Essential (primary) hypertension: Secondary | ICD-10-CM | POA: Insufficient documentation

## 2018-04-19 DIAGNOSIS — J441 Chronic obstructive pulmonary disease with (acute) exacerbation: Secondary | ICD-10-CM | POA: Insufficient documentation

## 2018-04-19 DIAGNOSIS — R0602 Shortness of breath: Secondary | ICD-10-CM

## 2018-04-19 DIAGNOSIS — D141 Benign neoplasm of larynx: Secondary | ICD-10-CM | POA: Diagnosis not present

## 2018-04-19 DIAGNOSIS — Z930 Tracheostomy status: Secondary | ICD-10-CM | POA: Diagnosis not present

## 2018-04-19 DIAGNOSIS — J3802 Paralysis of vocal cords and larynx, bilateral: Secondary | ICD-10-CM | POA: Diagnosis not present

## 2018-04-19 LAB — CBC WITH DIFFERENTIAL/PLATELET
Abs Immature Granulocytes: 0 10*3/uL (ref 0.0–0.1)
Basophils Absolute: 0.1 10*3/uL (ref 0.0–0.1)
Basophils Relative: 1 %
EOS ABS: 0.3 10*3/uL (ref 0.0–0.7)
Eosinophils Relative: 3 %
HEMATOCRIT: 38.3 % — AB (ref 39.0–52.0)
Hemoglobin: 12.6 g/dL — ABNORMAL LOW (ref 13.0–17.0)
Immature Granulocytes: 0 %
LYMPHS ABS: 2.3 10*3/uL (ref 0.7–4.0)
Lymphocytes Relative: 29 %
MCH: 30.6 pg (ref 26.0–34.0)
MCHC: 32.9 g/dL (ref 30.0–36.0)
MCV: 93 fL (ref 78.0–100.0)
Monocytes Absolute: 0.8 10*3/uL (ref 0.1–1.0)
Monocytes Relative: 10 %
NEUTROS ABS: 4.3 10*3/uL (ref 1.7–7.7)
Neutrophils Relative %: 57 %
Platelets: 271 10*3/uL (ref 150–400)
RBC: 4.12 MIL/uL — AB (ref 4.22–5.81)
RDW: 14 % (ref 11.5–15.5)
WBC: 7.7 10*3/uL (ref 4.0–10.5)

## 2018-04-19 LAB — BASIC METABOLIC PANEL
ANION GAP: 8 (ref 5–15)
BUN: 14 mg/dL (ref 8–23)
CALCIUM: 9 mg/dL (ref 8.9–10.3)
CO2: 26 mmol/L (ref 22–32)
Chloride: 101 mmol/L (ref 98–111)
Creatinine, Ser: 0.76 mg/dL (ref 0.61–1.24)
GFR calc Af Amer: >60 mL/min (ref >60)
GFR calc non Af Amer: >60 mL/min (ref >60)
Glucose, Bld: 104 mg/dL — ABNORMAL HIGH (ref 70–99)
Potassium: 3.3 mmol/L — ABNORMAL LOW (ref 3.5–5.1)
Sodium: 135 mmol/L (ref 135–145)

## 2018-04-19 MED ORDER — DOXYCYCLINE HYCLATE 100 MG PO CAPS: 100.0000 mg | ORAL_CAPSULE | Freq: Two times a day (BID) | ORAL | 0 refills | Status: AC

## 2018-04-19 MED ORDER — ALBUTEROL SULFATE (2.5 MG/3ML) 0.083% IN NEBU
5.0000 mg | INHALATION_SOLUTION | Freq: Once | RESPIRATORY_TRACT | Status: AC
  Administered 2018-04-19: 5 mg via RESPIRATORY_TRACT
  Filled 2018-04-19: qty 6

## 2018-04-19 MED ORDER — PREDNISONE 20 MG PO TABS
60.0000 mg | ORAL_TABLET | Freq: Once | ORAL | Status: AC
  Administered 2018-04-19: 60 mg via ORAL
  Filled 2018-04-19: qty 3

## 2018-04-19 MED ORDER — PREDNISONE 10 MG PO TABS: 40.0000 mg | ORAL_TABLET | Freq: Every day | ORAL | 0 refills | Status: AC

## 2018-04-19 MED ORDER — IPRATROPIUM-ALBUTEROL 0.5-2.5 (3) MG/3ML IN SOLN
3.0000 mL | Freq: Once | RESPIRATORY_TRACT | Status: AC
  Administered 2018-04-19: 3 mL via RESPIRATORY_TRACT
  Filled 2018-04-19: qty 3

## 2018-04-19 MED ORDER — DOXYCYCLINE HYCLATE 100 MG PO TABS
100.0000 mg | ORAL_TABLET | Freq: Once | ORAL | Status: AC
  Administered 2018-04-19: 100 mg via ORAL
  Filled 2018-04-19: qty 1
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
## 2018-04-19 NOTE — ED Triage Notes (Signed)
Pt presents with onset of shortness of breath that began today.  Pt reports thick white phlegm, has trach in place - reports self-suctioning at home

## 2018-04-19 NOTE — Discharge Instructions (Addendum)
Follow-up with your primary care doctor in 3-5 days for reevaluation.  Return to the emergency department if symptoms worsen.  Continue home medications and inhalers/nebulizer as previously prescribed.

## 2018-04-20 NOTE — ED Notes (Signed)
Patient Alert and oriented to baseline. Stable and ambulatory to baseline. Patient verbalized understanding of the discharge instructions.  Patient belongings were taken by the patient.   

## 2018-04-29 DIAGNOSIS — J3802 Paralysis of vocal cords and larynx, bilateral: Secondary | ICD-10-CM | POA: Diagnosis not present

## 2018-04-29 DIAGNOSIS — Z430 Encounter for attention to tracheostomy: Secondary | ICD-10-CM | POA: Diagnosis not present

## 2018-05-05 ENCOUNTER — Emergency Department (HOSPITAL_COMMUNITY): Payer: Medicare Other

## 2018-05-05 ENCOUNTER — Encounter (HOSPITAL_COMMUNITY): Payer: Self-pay

## 2018-05-05 ENCOUNTER — Inpatient Hospital Stay (HOSPITAL_COMMUNITY)
Admission: EM | Admit: 2018-05-05 | Discharge: 2018-05-06 | DRG: 208 | Disposition: A | Discharge status: Home / Self Care | Payer: Medicare Other | Attending: Pulmonary Disease | Admitting: Pulmonary Disease

## 2018-05-05 ENCOUNTER — Other Ambulatory Visit: Payer: Self-pay

## 2018-05-05 DIAGNOSIS — J40 Bronchitis, not specified as acute or chronic: Secondary | ICD-10-CM | POA: Diagnosis present

## 2018-05-05 DIAGNOSIS — J441 Chronic obstructive pulmonary disease with (acute) exacerbation: Principal | ICD-10-CM | POA: Diagnosis present

## 2018-05-05 DIAGNOSIS — J9621 Acute and chronic respiratory failure with hypoxia: Secondary | ICD-10-CM | POA: Diagnosis not present

## 2018-05-05 DIAGNOSIS — Z79899 Other long term (current) drug therapy: Secondary | ICD-10-CM

## 2018-05-05 DIAGNOSIS — Z930 Tracheostomy status: Secondary | ICD-10-CM

## 2018-05-05 DIAGNOSIS — R1310 Dysphagia, unspecified: Secondary | ICD-10-CM | POA: Diagnosis present

## 2018-05-05 DIAGNOSIS — R000 Tachycardia, unspecified: Secondary | ICD-10-CM | POA: Diagnosis not present

## 2018-05-05 DIAGNOSIS — R062 Wheezing: Secondary | ICD-10-CM | POA: Diagnosis not present

## 2018-05-05 DIAGNOSIS — R0902 Hypoxemia: Secondary | ICD-10-CM | POA: Diagnosis not present

## 2018-05-05 DIAGNOSIS — F419 Anxiety disorder, unspecified: Secondary | ICD-10-CM | POA: Diagnosis present

## 2018-05-05 DIAGNOSIS — I10 Essential (primary) hypertension: Secondary | ICD-10-CM | POA: Diagnosis present

## 2018-05-05 DIAGNOSIS — R55 Syncope and collapse: Secondary | ICD-10-CM | POA: Diagnosis not present

## 2018-05-05 DIAGNOSIS — R0602 Shortness of breath: Secondary | ICD-10-CM | POA: Diagnosis not present

## 2018-05-05 DIAGNOSIS — F1010 Alcohol abuse, uncomplicated: Secondary | ICD-10-CM | POA: Diagnosis present

## 2018-05-05 DIAGNOSIS — Z825 Family history of asthma and other chronic lower respiratory diseases: Secondary | ICD-10-CM

## 2018-05-05 DIAGNOSIS — R0689 Other abnormalities of breathing: Secondary | ICD-10-CM | POA: Diagnosis not present

## 2018-05-05 DIAGNOSIS — J80 Acute respiratory distress syndrome: Secondary | ICD-10-CM | POA: Diagnosis not present

## 2018-05-05 DIAGNOSIS — Z23 Encounter for immunization: Secondary | ICD-10-CM

## 2018-05-05 DIAGNOSIS — J9690 Respiratory failure, unspecified, unspecified whether with hypoxia or hypercapnia: Secondary | ICD-10-CM | POA: Diagnosis not present

## 2018-05-05 DIAGNOSIS — J9601 Acute respiratory failure with hypoxia: Secondary | ICD-10-CM | POA: Diagnosis not present

## 2018-05-05 LAB — BRAIN NATRIURETIC PEPTIDE: B Natriuretic Peptide: 11.7 pg/mL (ref 0.0–100.0)

## 2018-05-05 LAB — BLOOD GAS, VENOUS
ACID-BASE DEFICIT: 2 mmol/L (ref 0.0–2.0)
BICARBONATE: 22.9 mmol/L (ref 20.0–28.0)
FIO2: 40
LHR: 20 {breaths}/min
MECHVT: 500 mL
O2 SAT: 91 %
PATIENT TEMPERATURE: 98.6
PCO2 VEN: 41.7 mmHg — AB (ref 44.0–60.0)
PEEP/CPAP: 5 cmH2O
PO2 VEN: 71.3 mmHg — AB (ref 32.0–45.0)
pH, Ven: 7.359 (ref 7.250–7.430)

## 2018-05-05 LAB — BASIC METABOLIC PANEL
Anion gap: 10 (ref 5–15)
BUN: 19 mg/dL (ref 8–23)
CALCIUM: 9.3 mg/dL (ref 8.9–10.3)
CO2: 25 mmol/L (ref 22–32)
CREATININE: 0.9 mg/dL (ref 0.61–1.24)
Chloride: 102 mmol/L (ref 98–111)
GLUCOSE: 148 mg/dL — AB (ref 70–99)
Potassium: 4.2 mmol/L (ref 3.5–5.1)
Sodium: 137 mmol/L (ref 135–145)

## 2018-05-05 LAB — CBC WITH DIFFERENTIAL/PLATELET
BASOS PCT: 0 %
Basophils Absolute: 0 10*3/uL (ref 0.0–0.1)
Eosinophils Absolute: 0.7 10*3/uL (ref 0.0–0.7)
Eosinophils Relative: 8 %
HEMATOCRIT: 40.6 % (ref 39.0–52.0)
Hemoglobin: 13.6 g/dL (ref 13.0–17.0)
Lymphocytes Relative: 32 %
Lymphs Abs: 2.8 10*3/uL (ref 0.7–4.0)
MCH: 30.9 pg (ref 26.0–34.0)
MCHC: 33.5 g/dL (ref 30.0–36.0)
MCV: 92.3 fL (ref 78.0–100.0)
MONO ABS: 1 10*3/uL (ref 0.1–1.0)
MONOS PCT: 11 %
NEUTROS ABS: 4.2 10*3/uL (ref 1.7–7.7)
Neutrophils Relative %: 49 %
PLATELETS: 305 10*3/uL (ref 150–400)
RBC: 4.4 MIL/uL (ref 4.22–5.81)
RDW: 14.5 % (ref 11.5–15.5)
WBC: 8.7 10*3/uL (ref 4.0–10.5)

## 2018-05-05 LAB — GLUCOSE, CAPILLARY
GLUCOSE-CAPILLARY: 103 mg/dL — AB (ref 70–99)
Glucose-Capillary: 154 mg/dL — ABNORMAL HIGH (ref 70–99)
Glucose-Capillary: 132 mg/dL — ABNORMAL HIGH (ref 70–99)

## 2018-05-05 LAB — TROPONIN I: Troponin I: <0.03 ng/mL (ref <0.03)

## 2018-05-05 LAB — MAGNESIUM: Magnesium: 3.1 mg/dL — ABNORMAL HIGH (ref 1.7–2.4)

## 2018-05-05 LAB — MRSA PCR SCREENING: MRSA by PCR: NEGATIVE

## 2018-05-05 LAB — CBG MONITORING, ED: Glucose-Capillary: 173 mg/dL — ABNORMAL HIGH (ref 70–99)

## 2018-05-05 MED ORDER — ONDANSETRON HCL 4 MG/2ML IJ SOLN: 4.0000 mg | Freq: Four times a day (QID) | INTRAMUSCULAR | Status: DC | PRN

## 2018-05-05 MED ORDER — INSULIN ASPART 100 UNIT/ML ~~LOC~~ SOLN: 1.0000 [IU] | Freq: Four times a day (QID) | SUBCUTANEOUS | Status: DC | PRN

## 2018-05-05 MED ORDER — ORAL CARE MOUTH RINSE
15.0000 mL | OROMUCOSAL | Status: DC
  Administered 2018-05-05 (×4): 15 mL via OROMUCOSAL

## 2018-05-05 MED ORDER — HYDRALAZINE HCL 20 MG/ML IJ SOLN: 10.0000 mg | Freq: Four times a day (QID) | INTRAMUSCULAR | Status: DC | PRN

## 2018-05-05 MED ORDER — IPRATROPIUM-ALBUTEROL 0.5-2.5 (3) MG/3ML IN SOLN
3.0000 mL | Freq: Four times a day (QID) | RESPIRATORY_TRACT | Status: DC
  Administered 2018-05-05 – 2018-05-06 (×6): 3 mL via RESPIRATORY_TRACT
  Filled 2018-05-05 (×6): qty 3

## 2018-05-05 MED ORDER — METHYLPREDNISOLONE SODIUM SUCC 125 MG IJ SOLR: 60.0000 mg | Freq: Four times a day (QID) | INTRAMUSCULAR | Status: DC

## 2018-05-05 MED ORDER — FAMOTIDINE IN NACL 20-0.9 MG/50ML-% IV SOLN
20.0000 mg | Freq: Two times a day (BID) | INTRAVENOUS | Status: DC
  Administered 2018-05-05 – 2018-05-06 (×3): 20 mg via INTRAVENOUS
  Filled 2018-05-05 (×3): qty 50

## 2018-05-05 MED ORDER — FENTANYL CITRATE (PF) 100 MCG/2ML IJ SOLN: 50.0000 ug | INTRAMUSCULAR | Status: DC | PRN

## 2018-05-05 MED ORDER — LORAZEPAM 2 MG/ML IJ SOLN
1.0000 mg | Freq: Once | INTRAMUSCULAR | Status: AC
  Administered 2018-05-05: 1 mg via INTRAVENOUS

## 2018-05-05 MED ORDER — DOCUSATE SODIUM 50 MG/5ML PO LIQD: 100.0000 mg | Freq: Two times a day (BID) | ORAL | Status: DC | PRN

## 2018-05-05 MED ORDER — METHYLPREDNISOLONE SODIUM SUCC 125 MG IJ SOLR: 60.0000 mg | Freq: Two times a day (BID) | INTRAMUSCULAR | Status: DC

## 2018-05-05 MED ORDER — FENTANYL CITRATE (PF) 100 MCG/2ML IJ SOLN
50.0000 ug | INTRAMUSCULAR | Status: DC | PRN
  Administered 2018-05-05 (×2): 50 ug via INTRAVENOUS
  Filled 2018-05-05 (×2): qty 2

## 2018-05-05 MED ORDER — ARFORMOTEROL TARTRATE 15 MCG/2ML IN NEBU
15.0000 ug | INHALATION_SOLUTION | Freq: Two times a day (BID) | RESPIRATORY_TRACT | Status: DC
  Administered 2018-05-05 – 2018-05-06 (×3): 15 ug via RESPIRATORY_TRACT
  Filled 2018-05-05 (×4): qty 2

## 2018-05-05 MED ORDER — SODIUM CHLORIDE 0.9 % IV SOLN: 250.0000 mL | INTRAVENOUS | Status: DC | PRN

## 2018-05-05 MED ORDER — ACETAMINOPHEN 650 MG RE SUPP: 650.0000 mg | Freq: Four times a day (QID) | RECTAL | Status: DC | PRN

## 2018-05-05 MED ORDER — METHYLPREDNISOLONE SODIUM SUCC 40 MG IJ SOLR
40.0000 mg | Freq: Every day | INTRAMUSCULAR | Status: DC
  Administered 2018-05-06: 40 mg via INTRAVENOUS
  Filled 2018-05-05: qty 1

## 2018-05-05 MED ORDER — CHLORHEXIDINE GLUCONATE 0.12% ORAL RINSE (MEDLINE KIT): 15.0000 mL | Freq: Two times a day (BID) | OROMUCOSAL | Status: DC

## 2018-05-05 MED ORDER — HEPARIN SODIUM (PORCINE) 5000 UNIT/ML IJ SOLN
5000.0000 [IU] | Freq: Three times a day (TID) | INTRAMUSCULAR | Status: DC
  Administered 2018-05-05 – 2018-05-06 (×4): 5000 [IU] via SUBCUTANEOUS
  Filled 2018-05-05 (×4): qty 1

## 2018-05-05 MED ORDER — MIDAZOLAM HCL 2 MG/2ML IJ SOLN: 1.0000 mg | INTRAMUSCULAR | Status: DC | PRN

## 2018-05-05 MED ORDER — LORAZEPAM 2 MG/ML IJ SOLN: 2.0000 mg | INTRAMUSCULAR | Status: DC | PRN

## 2018-05-05 MED ORDER — LORAZEPAM 2 MG/ML IJ SOLN
INTRAMUSCULAR | Status: AC
  Filled 2018-05-05: qty 1

## 2018-05-05 MED ORDER — THIAMINE HCL 100 MG/ML IJ SOLN
100.0000 mg | Freq: Every day | INTRAMUSCULAR | Status: DC
  Administered 2018-05-05 – 2018-05-06 (×2): 100 mg via INTRAVENOUS
  Filled 2018-05-05 (×2): qty 2

## 2018-05-05 MED ORDER — ALBUTEROL (5 MG/ML) CONTINUOUS INHALATION SOLN
10.0000 mg/h | INHALATION_SOLUTION | RESPIRATORY_TRACT | Status: AC
  Administered 2018-05-05: 10 mg/h via RESPIRATORY_TRACT
  Filled 2018-05-05: qty 20

## 2018-05-05 MED ORDER — CHLORHEXIDINE GLUCONATE 0.12 % MT SOLN
15.0000 mL | Freq: Two times a day (BID) | OROMUCOSAL | Status: DC
  Administered 2018-05-05 – 2018-05-06 (×2): 15 mL via OROMUCOSAL
  Filled 2018-05-05 (×2): qty 15

## 2018-05-05 MED ORDER — ACETAMINOPHEN 160 MG/5ML PO SOLN
650.0000 mg | Freq: Four times a day (QID) | ORAL | Status: DC | PRN
  Administered 2018-05-06: 650 mg
  Filled 2018-05-05: qty 20.3

## 2018-05-05 MED ORDER — PNEUMOCOCCAL VAC POLYVALENT 25 MCG/0.5ML IJ INJ
0.5000 mL | INJECTION | INTRAMUSCULAR | Status: AC
  Administered 2018-05-06: 0.5 mL via INTRAMUSCULAR
  Filled 2018-05-05: qty 0.5

## 2018-05-05 MED ORDER — ORAL CARE MOUTH RINSE: 15.0000 mL | Freq: Two times a day (BID) | OROMUCOSAL | Status: DC

## 2018-05-05 MED ORDER — FOLIC ACID 5 MG/ML IJ SOLN
1.0000 mg | Freq: Every day | INTRAMUSCULAR | Status: DC
  Administered 2018-05-05 – 2018-05-06 (×2): 1 mg via INTRAVENOUS
  Filled 2018-05-05 (×2): qty 0.2

## 2018-05-05 NOTE — ED Triage Notes (Signed)
Pt presents to ED via EMS for respiratory distress. Pt has trach, developed trouble breathing around midnight. Pt attempted to take home nebs with no relief. EMS gave pt albuterol, atrovent, solumedrol, magnesium, and epi IM x3. Pt arrives alert, but in obvious distress.
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
## 2018-05-05 NOTE — Progress Notes (Signed)
Pt transported to 1229 from ED.  Pt suctioned and oral care prior to transport.  Pt transported on vent and remained stable throughout transport.

## 2018-05-05 NOTE — Progress Notes (Signed)
Pt is off vent on atc 40% in no distress. Trach changed back to 6 shiley cuffless without complication. Confirmed with CO2 detector. O2 saturation 100%.

## 2018-05-05 NOTE — ED Provider Notes (Signed)
[facility] DEPT Provider Note: Georgena Spurling, MD, FACEP  CSN: [id number] MRN: [id number] ARRIVAL: 05/05/18 at [place]: RESA/RESA   CHIEF COMPLAINT  Respiratory Distress  Level 5 Caveat: respiratory distress HISTORY OF PRESENT ILLNESS  05/05/18 3:08 AM [name] [name] is a 67 y.o. male with a history of COPD status post tracheostomy.  He is here with several hours of shortness of breath.  EMS for reports he was in extremis on their arrival with increased work of breathing, decreased air movement and inspiratory and expiratory wheezes.  He was given, prior to arrival, a total of 10 mg of albuterol and 1 mg of Atrovent by neb treatment, 125 mg of Solu-Medrol IV, 2 g of magnesium sulfate IV, and 0.3 mg of epinephrine IM.  He was also deep suctioned and EMS noted some blood in the trach tube post suctioning.  These treatments resulted in partial improvement.  He continues to have increased work of breathing.    Past Medical History:  Diagnosis Date  . Abnormal EKG 12/24/11   anteroseptal and lateral ST elevation, felt r/t early repolarization;  Cardiac cath 12/24/11 - normal coronary anatomy, EF 55-65%  . Anxiety   . Arthritis   . Asthma   . Bradycardia, sinus 12/24/11  . COPD (chronic obstructive pulmonary disease) ([place])   . Dyspnea   . H/O tracheostomy   . History of alcohol abuse    hospitalized for detox 2002  . HTN (hypertension)   . Hypotension 12/24/11   in the setting of dehydration   . Marijuana use   . Pneumonia   . Syncope and collapse 12/24/11   2/2 hypotension in the setting of dehydration  . Upper airway cough syndrome     Past Surgical History:  Procedure Laterality Date  . CARDIAC CATHETERIZATION  12/24/11   normal coronary anatomy, EF 55-65%  . CIRCUMCISION  1972  . COLONOSCOPY WITH PROPOFOL N/A 02/24/2018   Procedure: COLONOSCOPY WITH PROPOFOL;  Surgeon: Otis Brace, MD;  Location: [facility] ENDOSCOPY;  Service: Gastroenterology;  Laterality: N/A;  .  ESOPHAGOGASTRODUODENOSCOPY N/A 10/09/2015   Procedure: ESOPHAGOGASTRODUODENOSCOPY (EGD);  Surgeon: Judeth Horn, MD;  Location[facility] ENDOSCOPY;  Service: General;  Laterality: N/A;  . IRRIGATION AND DEBRIDEMENT KNEE Right 05/23/2017   Procedure: IRRIGATION AND DEBRIDEMENT KNEE;  Surgeon: Wylene Simmer, MD;  Location: [facility] ORS;  Service: Orthopedics;  Laterality: Right;  . LACERATION REPAIR  02/2004   arthroscopic debridement of triagular fibrocartilage tear/E-chart; right wrist  . LEFT HEART CATHETERIZATION WITH CORONARY ANGIOGRAM N/A 12/24/2011   Procedure: LEFT HEART CATHETERIZATION WITH CORONARY ANGIOGRAM;  Surgeon: Peter M Martinique, MD;  Location: [facility] CATH LAB;  Service: Cardiovascular;  Laterality: N/A;  . PEG PLACEMENT N/A 10/09/2015   Procedure: PERCUTANEOUS ENDOSCOPIC GASTROSTOMY (PEG) PLACEMENT;  Surgeon: Judeth Horn, MD;  Location: [place];  Service: General;  Laterality: N/A;  . PERCUTANEOUS TRACHEOSTOMY N/A 10/09/2015   Procedure: PERCUTANEOUS TRACHEOSTOMY;  Surgeon: Judeth Horn, MD;  Location: [place];  Service: General;  Laterality: N/A;  . POLYPECTOMY  02/24/2018   Procedure: POLYPECTOMY;  Surgeon: Otis Brace, MD;  Location: [facility] ENDOSCOPY;  Service: Gastroenterology;;    Family History  Problem Relation Age of Onset  . COPD Sister   . Cancer Sister   . Asthma Other     Social History   Tobacco Use  . Smoking status: Never Smoker  . Smokeless tobacco: Never Used  Substance Use Topics  . Alcohol use: Yes    Alcohol/week: 0.0 standard drinks  Comment: 24 ounce on Monday and Friday  . Drug use: No    Types: Marijuana    Comment: Former heavy marijuana smoker, quit 6-7 months ago    Prior to Admission medications   Medication Sig Start Date End Date Taking? Authorizing Provider  albuterol (PROVENTIL HFA;VENTOLIN HFA) 108 (90 Base) MCG/ACT inhaler Inhale 2 puffs into the lungs every 6 (six) hours as needed for wheezing or shortness of breath. 01/11/18   Thurnell Lose, MD    albuterol (PROVENTIL) (2.5 MG/3ML) 0.083% nebulizer solution Take 3-6 mLs (2.5-5 mg total) by nebulization every 4 (four) hours as needed for wheezing or shortness of breath. 03/12/15   Karrah Mangini, MD  BISACODYL 5 MG EC tablet TK 2 TS PO BID FOR 1 DAY UTD 02/21/18   [provider]  cetirizine (ZYRTEC) 10 MG tablet Take 10 mg by mouth daily as needed for allergies.  09/11/16   [provider]  famotidine (PEPCID) 20 MG tablet Take 1 tablet (20 mg total) by mouth at bedtime. One at bedtime 01/14/18   Tanda Rockers, MD  finasteride (PROSCAR) 5 MG tablet Take 5 mg by mouth daily.     [provider]  fluticasone (FLONASE) 50 MCG/ACT nasal spray Place 2 sprays into both nostrils daily as needed for allergies.     [provider]  Glycopyrrolate-Formoterol (BEVESPI AEROSPHERE) 9-4.8 MCG/ACT AERO Inhale 2 puffs into the lungs 2 (two) times daily. 01/14/18   Tanda Rockers, MD  hydrochlorothiazide (MICROZIDE) 12.5 MG capsule Take 12.5 mg by mouth daily.    [provider]  losartan (COZAAR) 25 MG tablet Take 1 tablet by mouth daily. 03/29/18   [provider]  Multiple Vitamin (MULTIVITAMIN) tablet Take 1 tablet by mouth daily.    [provider]  Naphazoline HCl (CLEAR EYES OP) Place 1 drop into both eyes daily.    [provider]  pantoprazole (PROTONIX) 40 MG tablet Take 1 tablet (40 mg total) by mouth daily. Take 30-60 min before first meal of the day 12/14/17   Tanda Rockers, MD    Allergies Patient has no known allergies.   REVIEW OF SYSTEMS     PHYSICAL EXAMINATION  Initial Vital Signs There were no vitals taken for this visit.  Examination General: Well-developed, well-nourished male in moderate distress; appearance consistent with age of record HENT: normocephalic; atraumatic Eyes: pupils equal, round and reactive to light; extraocular muscles intact Neck: supple; tracheostomy with bloody froth noted in  tube Heart: regular rate and rhythm; tachycardia Lungs: Tachypnea; accessory muscle use; inspiratory and expiratory wheezes; frequent coughing Abdomen: soft; nondistended; nontender; bowel sounds present Extremities: No deformity; full range of motion; pulses normal Neurologic: Awake, alert; motor function intact in all extremities and symmetric; no facial droop Skin: Warm and dry Psychiatric: Anxious; agitated   RESULTS  Summary of this visit's results, reviewed by myself:   EKG Interpretation  Date/Time:  Thursday May 05 2018 03:11:46 EDT Ventricular Rate:  109 PR Interval:    QRS Duration: 84 QT Interval:  318 QTC Calculation: 429 R Axis:   70 Text Interpretation:  Sinus tachycardia Right atrial enlargement Anteroseptal infarct, old No significant change was found Confirmed by Shanon Rosser [phone]) on 05/05/2018 3:17:36 AM      Laboratory Studies: Results for orders placed or performed during the hospital encounter of 05/05/18 (from the past 24 hour(s))  CBC with Differential/Platelet     Status: None   Collection Time: 05/05/18  3:13 AM  Result Value Ref Range   WBC 8.7 4.0 - 10.5 K/uL   RBC 4.40 4.22 - 5.81 MIL/uL   Hemoglobin 13.6 13.0 - 17.0 g/dL   HCT 40.6 39.0 - 52.0 %   MCV 92.3 78.0 - 100.0 fL   MCH 30.9 26.0 - 34.0 pg   MCHC 33.5 30.0 - 36.0 g/dL   RDW 14.5 11.5 - 15.5 %   Platelets 305 150 - 400 K/uL   Neutrophils Relative % 49 %   Neutro Abs 4.2 1.7 - 7.7 K/uL   Lymphocytes Relative 32 %   Lymphs Abs 2.8 0.7 - 4.0 K/uL   Monocytes Relative 11 %   Monocytes Absolute 1.0 0.1 - 1.0 K/uL   Eosinophils Relative 8 %   Eosinophils Absolute 0.7 0.0 - 0.7 K/uL   Basophils Relative 0 %   Basophils Absolute 0.0 0.0 - 0.1 K/uL  Basic metabolic panel     Status: Abnormal   Collection Time: 05/05/18  3:13 AM  Result Value Ref Range   Sodium 137 135 - 145 mmol/L   Potassium 4.2 3.5 - 5.1 mmol/L   Chloride 102 98 - 111 mmol/L   CO2 25 22 - 32 mmol/L   Glucose,  Bld 148 (H) 70 - 99 mg/dL   BUN 19 8 - 23 mg/dL   Creatinine, Ser 0.90 0.61 - 1.24 mg/dL   Calcium 9.3 8.9 - 10.3 mg/dL   GFR calc non Af Amer >60 >60 mL/min   GFR calc Af Amer >60 >60 mL/min   Anion gap 10 5 - 15  Troponin I     Status: None   Collection Time: 05/05/18  3:13 AM  Result Value Ref Range   Troponin I <0.03 <0.03 ng/mL  Brain natriuretic peptide     Status: None   Collection Time: 05/05/18  3:13 AM  Result Value Ref Range   B Natriuretic Peptide 11.7 0.0 - 100.0 pg/mL  Blood gas, venous     Status: Abnormal   Collection Time: 05/05/18  3:59 AM  Result Value Ref Range   FIO2 40.00    Delivery systems VENTILATOR    Mode PRESSURE REGULATED VOLUME CONTROL    VT 500 mL   LHR 20 resp/min   Peep/cpap 5.0 cm H20   pH, Ven 7.359 7.250 - 7.430   pCO2, Ven 41.7 (L) 44.0 - 60.0 mmHg   pO2, Ven 71.3 (H) 32.0 - 45.0 mmHg   Bicarbonate 22.9 20.0 - 28.0 mmol/L   Acid-base deficit 2.0 0.0 - 2.0 mmol/L   O2 Saturation 91.0 %   Patient temperature 98.6    Collection site VEIN    Drawn by DRAWN BY RN    Sample type VENOUS    Imaging Studies: Dg Chest Port 1 View  Result Date: 05/05/2018 CLINICAL DATA:  Shortness of breath. Bloody sputum from tracheostomy. EXAM: PORTABLE CHEST 1 VIEW COMPARISON:  Radiographs 04/19/2018 FINDINGS: Tracheostomy tube at the thoracic inlet. Unchanged heart size and mediastinal contours. Minimal left lung base scarring. No focal airspace disease, pleural effusion or pneumothorax. No acute osseous abnormalities. IMPRESSION: Tracheostomy tube at the thoracic inlet. Mild left lung base scarring. Electronically Signed   By: Jeb Levering M.D.   On: 05/05/2018 03:32    ED COURSE and MDM  Nursing notes and initial vitals signs, including pulse oximetry, reviewed.  Vitals:   05/05/18 0306 05/05/18 0317 05/05/18 0319 05/05/18 0354  BP:  (!) 141/79    Pulse:  (!) 110  Resp:  (!) 31    SpO2: 98% 97%  99%  Weight:   64 kg   Height:   5\' 5"  (1.651 m)     3:20 AM Respiratory therapist changing out uncuffed trach tube for cuffed.  Will place on ventilator for persistent respiratory distress.  4:17 AM Breathing more comfortably on ventilator.  Continuous neb treatment started.  6:08 AM Dr. Phylliss Bob at bedside.  PROCEDURES   CRITICAL CARE Performed by: Shanon Rosser L Total critical care time: 45 minutes Critical care time was exclusive of separately billable procedures and treating other patients. Critical care was necessary to treat or prevent imminent or life-threatening deterioration. Critical care was time spent personally by me on the following activities: development of treatment plan with patient and/or surrogate as well as nursing, discussions with consultants, evaluation of patient's response to treatment, examination of patient, obtaining history from patient or surrogate, ordering and performing treatments and interventions, ordering and review of laboratory studies, ordering and review of radiographic studies, pulse oximetry and re-evaluation of patient's condition.   ED DIAGNOSES     ICD-10-CM   1. COPD exacerbation ([place]) J44.1        Lotus Gover, MD 05/05/18 [id number]

## 2018-05-05 NOTE — ED Notes (Signed)
Bed: RESA Expected date:  Expected time:  Means of arrival:  Comments: 61 M trach pt SOB

## 2018-05-05 NOTE — Progress Notes (Signed)
ED TO INPATIENT HANDOFF REPORT  Name/Age/Gender [name] [name] 67 y.o. male  Code Status    Code Status Orders  (From admission, onward)         Start     Ordered   05/05/18 0546  Full code  Continuous     05/05/18 0548        Code Status History    Date Active Date Inactive Code Status Order ID Comments User Context   03/06/2018 1038 03/08/2018 1706 Full Code [id number]  Dessa Phi, DO Inpatient   01/10/2018 1745 01/11/2018 1943 Full Code [id number]  Karmen Bongo, MD ED   05/23/2017 0414 05/24/2017 2003 Full Code [id number]  Wylene Simmer, MD Inpatient   10/19/2016 0704 10/20/2016 1443 Full Code [id number]  Edwin Dada, MD Inpatient   05/09/2016 0152 05/10/2016 1411 Full Code [id number]  Toy Baker, MD Inpatient   01/02/2016 1105 01/05/2016 1937 Full Code [id number]  Sid Falcon, MD ED   12/19/2015 2348 12/22/2015 1614 Full Code [id number]  Ivor Costa, MD ED   09/18/2015 2037 11/07/2015 1800 Full Code [id number]  Judeth Horn, MD ED   05/11/2015 1224 05/13/2015 1946 Full Code [id number]  Francesca Oman, DO Inpatient   04/02/2015 0927 04/03/2015 1552 Full Code [id number]  Quintella Baton, MD Inpatient      Home/SNF/Other Home  Chief Complaint respiratory distress  Level of Care/Admitting Diagnosis ED Disposition    ED Disposition Condition [facility] Area: [facility] [[id number]]  Level of Care: ICU [6]  Diagnosis: COPD with acute exacerbation [facility]) [[id number]]  Admitting Physician: Reyne Dumas [[id number]]  Attending Physician: Reyne Dumas [[id number]]  Estimated length of stay: 3 - 4 days  Certification:: I certify this patient will need inpatient services for at least 2 midnights  PT Class (Do Not Modify): Inpatient [101]  PT Acc Code (Do Not Modify): Private [1]       Medical History Past Medical History:  Diagnosis Date  . Abnormal EKG 12/24/11   anteroseptal and lateral ST elevation, felt r/t early repolarization;   Cardiac cath 12/24/11 - normal coronary anatomy, EF 55-65%  . Anxiety   . Arthritis   . Asthma   . Bradycardia, sinus 12/24/11  . COPD (chronic obstructive pulmonary disease) ([place])   . Dyspnea   . H/O tracheostomy   . History of alcohol abuse    hospitalized for detox 2002  . HTN (hypertension)   . Hypotension 12/24/11   in the setting of dehydration   . Marijuana use   . Pneumonia   . Syncope and collapse 12/24/11   2/2 hypotension in the setting of dehydration  . Upper airway cough syndrome     Allergies No Known Allergies  IV Location/Drains/Wounds Patient Lines/Drains/Airways Status   Active Line/Drains/Airways    Name:   Placement date:   Placement time:   Site:   Days:   Peripheral IV 05/05/18 Left Antecubital   05/05/18    -    Antecubital   less than 1   Incision (Closed) 05/23/17 Knee Right   05/23/17    0236     347   Incision (Closed) 05/23/17 Arm Right   05/23/17    0253     347   Incision (Closed) 05/23/17 Hand Right   05/23/17    0253     347   Incision (Closed) 05/23/17 Knee Left   05/23/17    0253     347  Incision (Closed) 05/23/17 Foot Left   05/23/17    0253     347   Tracheostomy Shiley 4 mm Cuffed   05/05/18    0330    4 mm   less than 1          Labs/Imaging Results for orders placed or performed during the hospital encounter of 05/05/18 (from the past 48 hour(s))  CBC with Differential/Platelet     Status: None   Collection Time: 05/05/18  3:13 AM  Result Value Ref Range   WBC 8.7 4.0 - 10.5 K/uL   RBC 4.40 4.22 - 5.81 MIL/uL   Hemoglobin 13.6 13.0 - 17.0 g/dL   HCT 40.6 39.0 - 52.0 %   MCV 92.3 78.0 - 100.0 fL   MCH 30.9 26.0 - 34.0 pg   MCHC 33.5 30.0 - 36.0 g/dL   RDW 14.5 11.5 - 15.5 %   Platelets 305 150 - 400 K/uL   Neutrophils Relative % 49 %   Neutro Abs 4.2 1.7 - 7.7 K/uL   Lymphocytes Relative 32 %   Lymphs Abs 2.8 0.7 - 4.0 K/uL   Monocytes Relative 11 %   Monocytes Absolute 1.0 0.1 - 1.0 K/uL   Eosinophils Relative 8 %    Eosinophils Absolute 0.7 0.0 - 0.7 K/uL   Basophils Relative 0 %   Basophils Absolute 0.0 0.0 - 0.1 K/uL    Comment: Performed at [facility], [place] [address] [place], [place] [address]  Basic metabolic panel     Status: Abnormal   Collection Time: 05/05/18  3:13 AM  Result Value Ref Range   Sodium 137 135 - 145 mmol/L   Potassium 4.2 3.5 - 5.1 mmol/L   Chloride 102 98 - 111 mmol/L   CO2 25 22 - 32 mmol/L   Glucose, Bld 148 (H) 70 - 99 mg/dL   BUN 19 8 - 23 mg/dL   Creatinine, Ser 0.90 0.61 - 1.24 mg/dL   Calcium 9.3 8.9 - 10.3 mg/dL   GFR calc non Af Amer >60 >60 mL/min   GFR calc Af Amer >60 >60 mL/min    Comment: (NOTE) The eGFR has been calculated using the CKD EPI equation. This calculation has not been validated in all clinical situations. eGFR's persistently <60 mL/min signify possible Chronic Kidney Disease.    Anion gap 10 5 - 15    Comment: Performed at [facility], [place] [address] [place], [place] [address]  Troponin I     Status: None   Collection Time: 05/05/18  3:13 AM  Result Value Ref Range   Troponin I <0.03 <0.03 ng/mL    Comment: Performed at [facility], [place] [address] [place], [place] [address]  Brain natriuretic peptide     Status: None   Collection Time: 05/05/18  3:13 AM  Result Value Ref Range   B Natriuretic Peptide 11.7 0.0 - 100.0 pg/mL    Comment: Performed at [facility], [place] [address], [place], [place] [address]  Blood gas, venous     Status: Abnormal   Collection Time: 05/05/18  3:59 AM  Result Value Ref Range   FIO2 40.00    Delivery systems VENTILATOR    Mode PRESSURE REGULATED VOLUME CONTROL    VT 500 mL   LHR 20 resp/min   Peep/cpap 5.0 cm H20   pH, Ven 7.359 7.250 - 7.430   pCO2, Ven 41.7 (L) 44.0 - 60.0 mmHg  pO2, Ven 71.3 (H) 32.0 - 45.0 mmHg   Bicarbonate 22.9 20.0 - 28.0 mmol/L   Acid-base deficit 2.0 0.0 - 2.0 mmol/L   O2 Saturation 91.0 %    Patient temperature 98.6    Collection site VEIN    Drawn by DRAWN BY RN    Sample type VENOUS     Comment: Performed at [facility], [place] [address] [place], [place] [address]  CBG monitoring, ED     Status: Abnormal   Collection Time: 05/05/18  6:21 AM  Result Value Ref Range   Glucose-Capillary 173 (H) 70 - 99 mg/dL   Dg Chest Port 1 View  Result Date: 05/05/2018 CLINICAL DATA:  Shortness of breath. Bloody sputum from tracheostomy. EXAM: PORTABLE CHEST 1 VIEW COMPARISON:  Radiographs 04/19/2018 FINDINGS: Tracheostomy tube at the thoracic inlet. Unchanged heart size and mediastinal contours. Minimal left lung base scarring. No focal airspace disease, pleural effusion or pneumothorax. No acute osseous abnormalities. IMPRESSION: Tracheostomy tube at the thoracic inlet. Mild left lung base scarring. Electronically Signed   By: Jeb Levering M.D.   On: 05/05/2018 03:32    Pending Labs Unresulted Labs (From admission, onward)    Start     Ordered   05/06/18 0500  CBC  Tomorrow morning,   R     05/05/18 0548   05/06/18 8242  Basic metabolic panel  Tomorrow morning,   R     05/05/18 0548   05/06/18 0500  Blood gas, arterial  Tomorrow morning,   R     05/05/18 0548   05/06/18 0500  Magnesium  Tomorrow morning,   R     05/05/18 0548   05/06/18 0500  Phosphorus  Tomorrow morning,   R     05/05/18 0548   05/05/18 0539  Magnesium  STAT,   R     05/05/18 0538   05/05/18 0539  Urine rapid drug screen (hosp performed)  STAT,   R     05/05/18 0538          Vitals/Pain Today's Vitals   05/05/18 0600 05/05/18 0606 05/05/18 0615 05/05/18 0616  BP: 130/69  128/82   Pulse: 99  (!) 103   Resp: (!) 22  (!) 23   Temp:    98 F (36.7 C)  TempSrc:    Oral  SpO2: 97% 97% 100%   Weight:      Height:      PainSc:        Isolation Precautions No active isolations  Medications Medications  LORazepam (ATIVAN) 2 MG/ML injection (has no administration in time range)   albuterol (PROVENTIL,VENTOLIN) solution continuous neb (10 mg/hr Nebulization New Bag/Given 05/05/18 0430)  ipratropium-albuterol (DUONEB) 0.5-2.5 (3) MG/3ML nebulizer solution 3 mL (has no administration in time range)  arformoterol (BROVANA) nebulizer solution 15 mcg (15 mcg Nebulization Given 05/05/18 0606)  methylPREDNISolone sodium succinate (SOLU-MEDROL) 125 mg/2 mL injection 60 mg (has no administration in time range)  thiamine (B-1) injection 100 mg (has no administration in time range)  folic acid injection 1 mg (has no administration in time range)  LORazepam (ATIVAN) injection 2-3 mg (has no administration in time range)  0.9 %  sodium chloride infusion (has no administration in time range)  heparin injection 5,000 Units (has no administration in time range)  famotidine (PEPCID) IVPB 20 mg premix (has no administration in time range)  ondansetron (ZOFRAN) injection 4 mg (has no administration in time range)  chlorhexidine gluconate (MEDLINE KIT) (  PERIDEX) 0.12 % solution 15 mL (has no administration in time range)  MEDLINE mouth rinse (has no administration in time range)  insulin aspart (novoLOG) injection 1-3 Units (has no administration in time range)  fentaNYL (SUBLIMAZE) injection 50 mcg (has no administration in time range)  fentaNYL (SUBLIMAZE) injection 50 mcg (has no administration in time range)  midazolam (VERSED) injection 1 mg (has no administration in time range)  midazolam (VERSED) injection 1 mg (has no administration in time range)  docusate (COLACE) 50 MG/5ML liquid 100 mg (has no administration in time range)  acetaminophen (TYLENOL) solution 650 mg (has no administration in time range)    Or  acetaminophen (TYLENOL) suppository 650 mg (has no administration in time range)  hydrALAZINE (APRESOLINE) injection 10 mg (has no administration in time range)  LORazepam (ATIVAN) injection 1 mg (1 mg Intravenous Given 05/05/18 0348)    Mobility walks

## 2018-05-06 ENCOUNTER — Inpatient Hospital Stay (HOSPITAL_COMMUNITY): Payer: Medicare Other

## 2018-05-06 DIAGNOSIS — Z23 Encounter for immunization: Secondary | ICD-10-CM | POA: Diagnosis not present

## 2018-05-06 LAB — BASIC METABOLIC PANEL
ANION GAP: 9 (ref 5–15)
BUN: 14 mg/dL (ref 8–23)
CO2: 25 mmol/L (ref 22–32)
CREATININE: 0.81 mg/dL (ref 0.61–1.24)
Calcium: 9.1 mg/dL (ref 8.9–10.3)
Chloride: 102 mmol/L (ref 98–111)
GFR calc Af Amer: >60 mL/min (ref >60)
GFR calc non Af Amer: >60 mL/min (ref >60)
GLUCOSE: 103 mg/dL — AB (ref 70–99)
Potassium: 3.9 mmol/L (ref 3.5–5.1)
Sodium: 136 mmol/L (ref 135–145)

## 2018-05-06 LAB — BLOOD GAS, ARTERIAL
ACID-BASE EXCESS: 2.3 mmol/L — AB (ref 0.0–2.0)
BICARBONATE: 25.9 mmol/L (ref 20.0–28.0)
Drawn by: 225631
FIO2: 28
LHR: 18 {breaths}/min
O2 CONTENT: 5 L/min
O2 Saturation: 95.9 %
PATIENT TEMPERATURE: 97.8
pCO2 arterial: 37.5 mmHg (ref 32.0–48.0)
pH, Arterial: 7.452 — ABNORMAL HIGH (ref 7.350–7.450)
pO2, Arterial: 81.9 mmHg — ABNORMAL LOW (ref 83.0–108.0)

## 2018-05-06 LAB — CBC
HEMATOCRIT: 37.3 % — AB (ref 39.0–52.0)
Hemoglobin: 12.5 g/dL — ABNORMAL LOW (ref 13.0–17.0)
MCH: 30.8 pg (ref 26.0–34.0)
MCHC: 33.5 g/dL (ref 30.0–36.0)
MCV: 91.9 fL (ref 78.0–100.0)
PLATELETS: 293 10*3/uL (ref 150–400)
RBC: 4.06 MIL/uL — ABNORMAL LOW (ref 4.22–5.81)
RDW: 14.7 % (ref 11.5–15.5)
WBC: 10.4 10*3/uL (ref 4.0–10.5)

## 2018-05-06 LAB — PHOSPHORUS: PHOSPHORUS: 3.4 mg/dL (ref 2.5–4.6)

## 2018-05-06 LAB — GLUCOSE, CAPILLARY
GLUCOSE-CAPILLARY: 94 mg/dL (ref 70–99)
Glucose-Capillary: 97 mg/dL (ref 70–99)

## 2018-05-06 LAB — MAGNESIUM: Magnesium: 2.2 mg/dL (ref 1.7–2.4)

## 2018-05-06 MED ORDER — DEXTROMETHORPHAN POLISTIREX ER 30 MG/5ML PO SUER
30.0000 mg | Freq: Once | ORAL | Status: AC
  Administered 2018-05-06: 30 mg via ORAL
  Filled 2018-05-06: qty 5

## 2018-05-06 MED ORDER — PREDNISONE 10 MG PO TABS: ORAL_TABLET | ORAL | 0 refills | Status: AC

## 2018-05-06 MED ORDER — PHENOL 1.4 % MT LIQD
1.0000 | OROMUCOSAL | Status: DC | PRN
  Administered 2018-05-06: 1 via OROMUCOSAL
  Filled 2018-05-06: qty 177

## 2018-05-06 MED ORDER — DEXTROMETHORPHAN POLISTIREX ER 30 MG/5ML PO SUER: 15.0000 mg | Freq: Two times a day (BID) | ORAL | 0 refills | Status: DC | PRN

## 2018-05-06 MED ORDER — AZITHROMYCIN 500 MG PO TABS: 500.0000 mg | ORAL_TABLET | Freq: Every day | ORAL | 0 refills | Status: DC

## 2018-05-06 MED ORDER — AZITHROMYCIN 500 MG PO TABS: 500.0000 mg | ORAL_TABLET | Freq: Every day | ORAL | 0 refills | Status: AC

## 2018-05-06 MED ORDER — ZOLPIDEM TARTRATE 5 MG PO TABS
5.0000 mg | ORAL_TABLET | Freq: Once | ORAL | Status: AC
  Administered 2018-05-06: 5 mg via ORAL
  Filled 2018-05-06: qty 1

## 2018-05-06 MED ORDER — ALBUTEROL SULFATE (2.5 MG/3ML) 0.083% IN NEBU
2.5000 mg | INHALATION_SOLUTION | RESPIRATORY_TRACT | Status: DC | PRN
  Administered 2018-05-06 (×2): 2.5 mg via RESPIRATORY_TRACT
  Filled 2018-05-06 (×2): qty 3

## 2018-05-06 NOTE — Care Management Note (Signed)
Case Management Note  Patient Details  Name: [name] [name] MRN: [id number] Date of Birth: 02/26/1951  Subjective/Objective:                  Progressed to trache collar at 28% fi02/labs wnl/continues to get nebs and iv solumedrol  Action/Plan: Following for progression and cm needs  Expected Discharge Date:                  Expected Discharge Plan:  Home/Self Care  In-House Referral:     Discharge planning Services  CM Consult  Post Acute Care Choice:    Choice offered to:     DME Arranged:    DME Agency:     HH Arranged:    [place] Agency:     Status of Service:  In process, will continue to follow  If discussed at Long Length of Stay Meetings, dates discussed:    Additional Comments:  Leeroy Cha, RN 05/06/2018, 9:08 AM

## 2018-05-06 NOTE — Care Management CC44 (Signed)
Condition Code 44 Documentation Completed  Patient Details  Name: Doyne Micke MRN: [id number] Date of Birth: 02-11-1951   Condition Code 44 given:  Yes Patient signature on Condition Code 44 notice:  Yes Documentation of 2 MD's agreement:  Yes Code 44 added to claim:  Yes    Leeroy Cha, RN 05/06/2018, 12:26 PM

## 2018-05-06 NOTE — Care Management Note (Signed)
Case Management Note  Patient Details  Name: [name] [name] MRN: [id number] Date of Birth: 1951/08/06  Subjective/Objective:                  Discharged to home with self care/has family to help and has no needs-has had trach and is aware of how to care for/  Action/Plan: Will sign off code 93 done due to less than 24 hours in house.  Expected Discharge Date:  05/06/18               Expected Discharge Plan:  Home/Self Care  In-House Referral:     Discharge planning Services  CM Consult  Post Acute Care Choice:    Choice offered to:     DME Arranged:    DME Agency:     HH Arranged:    HH Agency:     Status of Service:  Completed, signed off  If discussed at [employer] of Stay Meetings, dates discussed:    Additional Comments:  Leeroy Cha, RN 05/06/2018, 12:46 PM

## 2018-05-06 NOTE — Progress Notes (Signed)
[place] Progress Note Patient Name: [name] [name] DOB: 08-Jan-1951 MRN: [id number]   Date of Service  05/06/2018  HPI/Events of Note  Patient requests sleep aid.   eICU Interventions  Will order: 1. Ambien 5 mg PO X 1 now.      Intervention Category Major Interventions: Other:  Lysle Dingwall 05/06/2018, 12:52 AM
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
## 2018-05-06 NOTE — Progress Notes (Signed)
AVS reviewed with patient and daughter, Shaune Pascal. Pt and daughter given opportunity to ask questions. PIV removed, belongings with pt. Pt taken to main entrance in wheelchair.

## 2018-05-09 DIAGNOSIS — Z430 Encounter for attention to tracheostomy: Secondary | ICD-10-CM | POA: Diagnosis not present

## 2018-05-11 DIAGNOSIS — J441 Chronic obstructive pulmonary disease with (acute) exacerbation: Secondary | ICD-10-CM | POA: Diagnosis not present

## 2018-05-22 ENCOUNTER — Other Ambulatory Visit: Payer: Self-pay | Admitting: Internal Medicine

## 2018-05-24 DIAGNOSIS — J329 Chronic sinusitis, unspecified: Secondary | ICD-10-CM | POA: Diagnosis not present

## 2018-05-24 DIAGNOSIS — J3802 Paralysis of vocal cords and larynx, bilateral: Secondary | ICD-10-CM | POA: Diagnosis not present

## 2018-05-24 DIAGNOSIS — Z930 Tracheostomy status: Secondary | ICD-10-CM | POA: Diagnosis not present

## 2018-05-24 DIAGNOSIS — J019 Acute sinusitis, unspecified: Secondary | ICD-10-CM | POA: Diagnosis not present

## 2018-05-24 DIAGNOSIS — J324 Chronic pansinusitis: Secondary | ICD-10-CM | POA: Diagnosis not present

## 2018-05-27 ENCOUNTER — Inpatient Hospital Stay (HOSPITAL_COMMUNITY)
Admission: EM | Admit: 2018-05-27 | Discharge: 2018-05-30 | DRG: 189 | Disposition: A | Discharge status: Home / Self Care | Payer: Medicare Other | Attending: Internal Medicine | Admitting: Internal Medicine

## 2018-05-27 ENCOUNTER — Emergency Department (HOSPITAL_COMMUNITY): Payer: Medicare Other

## 2018-05-27 ENCOUNTER — Other Ambulatory Visit: Payer: Self-pay

## 2018-05-27 ENCOUNTER — Encounter (HOSPITAL_COMMUNITY): Payer: Self-pay | Admitting: Internal Medicine

## 2018-05-27 DIAGNOSIS — R0602 Shortness of breath: Secondary | ICD-10-CM | POA: Diagnosis not present

## 2018-05-27 DIAGNOSIS — N400 Enlarged prostate without lower urinary tract symptoms: Secondary | ICD-10-CM | POA: Diagnosis present

## 2018-05-27 DIAGNOSIS — J441 Chronic obstructive pulmonary disease with (acute) exacerbation: Secondary | ICD-10-CM | POA: Diagnosis present

## 2018-05-27 DIAGNOSIS — J329 Chronic sinusitis, unspecified: Secondary | ICD-10-CM | POA: Diagnosis not present

## 2018-05-27 DIAGNOSIS — Z79899 Other long term (current) drug therapy: Secondary | ICD-10-CM | POA: Diagnosis not present

## 2018-05-27 DIAGNOSIS — Z825 Family history of asthma and other chronic lower respiratory diseases: Secondary | ICD-10-CM | POA: Diagnosis not present

## 2018-05-27 DIAGNOSIS — I10 Essential (primary) hypertension: Secondary | ICD-10-CM | POA: Diagnosis not present

## 2018-05-27 DIAGNOSIS — Z930 Tracheostomy status: Secondary | ICD-10-CM | POA: Diagnosis not present

## 2018-05-27 DIAGNOSIS — J40 Bronchitis, not specified as acute or chronic: Secondary | ICD-10-CM | POA: Diagnosis not present

## 2018-05-27 DIAGNOSIS — J9601 Acute respiratory failure with hypoxia: Principal | ICD-10-CM | POA: Diagnosis present

## 2018-05-27 DIAGNOSIS — R000 Tachycardia, unspecified: Secondary | ICD-10-CM | POA: Diagnosis not present

## 2018-05-27 LAB — BASIC METABOLIC PANEL
Anion gap: 11 (ref 5–15)
BUN: 12 mg/dL (ref 8–23)
CHLORIDE: 102 mmol/L (ref 98–111)
CO2: 24 mmol/L (ref 22–32)
Calcium: 9.4 mg/dL (ref 8.9–10.3)
Creatinine, Ser: 1.01 mg/dL (ref 0.61–1.24)
GFR calc Af Amer: >60 mL/min (ref >60)
GFR calc non Af Amer: >60 mL/min (ref >60)
GLUCOSE: 89 mg/dL (ref 70–99)
POTASSIUM: 4.6 mmol/L (ref 3.5–5.1)
SODIUM: 137 mmol/L (ref 135–145)

## 2018-05-27 LAB — CBC
HEMATOCRIT: 44 % (ref 39.0–52.0)
HEMOGLOBIN: 14.5 g/dL (ref 13.0–17.0)
MCH: 30.9 pg (ref 26.0–34.0)
MCHC: 33 g/dL (ref 30.0–36.0)
MCV: 93.8 fL (ref 78.0–100.0)
Platelets: 316 10*3/uL (ref 150–400)
RBC: 4.69 MIL/uL (ref 4.22–5.81)
RDW: 14.4 % (ref 11.5–15.5)
WBC: 8.5 10*3/uL (ref 4.0–10.5)

## 2018-05-27 LAB — I-STAT VENOUS BLOOD GAS, ED
Acid-base deficit: 2 mmol/L (ref 0.0–2.0)
Bicarbonate: 24.5 mmol/L (ref 20.0–28.0)
O2 Saturation: 77 %
PCO2 VEN: 48.1 mmHg (ref 44.0–60.0)
PH VEN: 7.315 (ref 7.250–7.430)
TCO2: 26 mmol/L (ref 22–32)
pO2, Ven: 46 mmHg — ABNORMAL HIGH (ref 32.0–45.0)

## 2018-05-27 MED ORDER — FLUTICASONE PROPIONATE 50 MCG/ACT NA SUSP: 2.0000 | Freq: Every day | NASAL | Status: DC | PRN

## 2018-05-27 MED ORDER — LOSARTAN POTASSIUM 25 MG PO TABS
25.0000 mg | ORAL_TABLET | Freq: Every day | ORAL | Status: DC
  Administered 2018-05-28 – 2018-05-30 (×3): 25 mg via ORAL
  Filled 2018-05-27 (×3): qty 1

## 2018-05-27 MED ORDER — TERAZOSIN HCL 5 MG PO CAPS
5.0000 mg | ORAL_CAPSULE | Freq: Every day | ORAL | Status: DC
  Administered 2018-05-28 – 2018-05-29 (×3): 5 mg via ORAL
  Filled 2018-05-27 (×4): qty 1

## 2018-05-27 MED ORDER — MAGNESIUM SULFATE 2 GM/50ML IV SOLN
2.0000 g | Freq: Once | INTRAVENOUS | Status: AC
  Administered 2018-05-27: 2 g via INTRAVENOUS
  Filled 2018-05-27: qty 50

## 2018-05-27 MED ORDER — ADULT MULTIVITAMIN W/MINERALS CH
1.0000 | ORAL_TABLET | Freq: Every day | ORAL | Status: DC
  Administered 2018-05-28 – 2018-05-30 (×3): 1 via ORAL
  Filled 2018-05-27 (×3): qty 1

## 2018-05-27 MED ORDER — ALBUTEROL SULFATE (2.5 MG/3ML) 0.083% IN NEBU
2.5000 mg | INHALATION_SOLUTION | RESPIRATORY_TRACT | Status: DC
  Administered 2018-05-28: 2.5 mg via RESPIRATORY_TRACT
  Filled 2018-05-27: qty 3

## 2018-05-27 MED ORDER — ALBUTEROL SULFATE HFA 108 (90 BASE) MCG/ACT IN AERS: 2.0000 | INHALATION_SPRAY | Freq: Four times a day (QID) | RESPIRATORY_TRACT | Status: DC | PRN

## 2018-05-27 MED ORDER — BISACODYL 5 MG PO TBEC: 10.0000 mg | DELAYED_RELEASE_TABLET | Freq: Every day | ORAL | Status: DC | PRN

## 2018-05-27 MED ORDER — ONDANSETRON HCL 4 MG/2ML IJ SOLN: 4.0000 mg | Freq: Four times a day (QID) | INTRAMUSCULAR | Status: DC | PRN

## 2018-05-27 MED ORDER — ONDANSETRON HCL 4 MG PO TABS: 4.0000 mg | ORAL_TABLET | Freq: Four times a day (QID) | ORAL | Status: DC | PRN

## 2018-05-27 MED ORDER — ALBUTEROL SULFATE (2.5 MG/3ML) 0.083% IN NEBU
2.5000 mg | INHALATION_SOLUTION | RESPIRATORY_TRACT | Status: DC | PRN
  Administered 2018-05-28 – 2018-05-29 (×4): 2.5 mg via RESPIRATORY_TRACT
  Filled 2018-05-27 (×4): qty 3

## 2018-05-27 MED ORDER — ACETAMINOPHEN 325 MG PO TABS
650.0000 mg | ORAL_TABLET | Freq: Four times a day (QID) | ORAL | Status: DC | PRN
  Administered 2018-05-28 – 2018-05-29 (×3): 650 mg via ORAL
  Filled 2018-05-27 (×3): qty 2

## 2018-05-27 MED ORDER — METHYLPREDNISOLONE SODIUM SUCC 40 MG IJ SOLR
40.0000 mg | Freq: Two times a day (BID) | INTRAMUSCULAR | Status: DC
  Administered 2018-05-28 – 2018-05-30 (×5): 40 mg via INTRAVENOUS
  Filled 2018-05-27 (×5): qty 1

## 2018-05-27 MED ORDER — HYDROCHLOROTHIAZIDE 12.5 MG PO CAPS
12.5000 mg | ORAL_CAPSULE | Freq: Every day | ORAL | Status: DC
  Administered 2018-05-28: 12.5 mg via ORAL
  Filled 2018-05-27: qty 1

## 2018-05-27 MED ORDER — METHYLPREDNISOLONE SODIUM SUCC 125 MG IJ SOLR
125.0000 mg | Freq: Once | INTRAMUSCULAR | Status: AC
  Administered 2018-05-27: 125 mg via INTRAVENOUS

## 2018-05-27 MED ORDER — ALBUTEROL (5 MG/ML) CONTINUOUS INHALATION SOLN
10.0000 mg/h | INHALATION_SOLUTION | RESPIRATORY_TRACT | Status: DC
  Administered 2018-05-27: 10 mg/h via RESPIRATORY_TRACT
  Filled 2018-05-27: qty 20

## 2018-05-27 MED ORDER — METHYLPREDNISOLONE SODIUM SUCC 125 MG IJ SOLR
INTRAMUSCULAR | Status: AC
  Administered 2018-05-27: 125 mg via INTRAVENOUS
  Filled 2018-05-27: qty 2

## 2018-05-27 MED ORDER — BUDESONIDE 0.25 MG/2ML IN SUSP
0.2500 mg | Freq: Two times a day (BID) | RESPIRATORY_TRACT | Status: DC
  Administered 2018-05-28 – 2018-05-30 (×6): 0.25 mg via RESPIRATORY_TRACT
  Filled 2018-05-27 (×6): qty 2

## 2018-05-27 MED ORDER — IPRATROPIUM BROMIDE 0.02 % IN SOLN
0.5000 mg | RESPIRATORY_TRACT | Status: DC
  Administered 2018-05-28: 0.5 mg via RESPIRATORY_TRACT
  Filled 2018-05-27: qty 2.5

## 2018-05-27 MED ORDER — IPRATROPIUM BROMIDE 0.02 % IN SOLN
0.5000 mg | Freq: Once | RESPIRATORY_TRACT | Status: AC
  Administered 2018-05-27: 0.5 mg via RESPIRATORY_TRACT

## 2018-05-27 MED ORDER — SODIUM CHLORIDE 0.9 % IV SOLN
500.0000 mg | Freq: Once | INTRAVENOUS | Status: AC
  Administered 2018-05-27: 500 mg via INTRAVENOUS
  Filled 2018-05-27: qty 500

## 2018-05-27 MED ORDER — PANTOPRAZOLE SODIUM 40 MG PO TBEC
40.0000 mg | DELAYED_RELEASE_TABLET | Freq: Every day | ORAL | Status: DC
  Administered 2018-05-28 – 2018-05-30 (×3): 40 mg via ORAL
  Filled 2018-05-27 (×3): qty 1

## 2018-05-27 MED ORDER — FINASTERIDE 5 MG PO TABS
5.0000 mg | ORAL_TABLET | Freq: Every day | ORAL | Status: DC
  Administered 2018-05-28 – 2018-05-30 (×3): 5 mg via ORAL
  Filled 2018-05-27 (×3): qty 1

## 2018-05-27 MED ORDER — ALBUTEROL SULFATE (2.5 MG/3ML) 0.083% IN NEBU
5.0000 mg | INHALATION_SOLUTION | Freq: Once | RESPIRATORY_TRACT | Status: AC
  Administered 2018-05-27: 5 mg via RESPIRATORY_TRACT
  Filled 2018-05-27: qty 6

## 2018-05-27 MED ORDER — LEVOFLOXACIN IN D5W 500 MG/100ML IV SOLN
500.0000 mg | INTRAVENOUS | Status: DC
  Filled 2018-05-27: qty 100

## 2018-05-27 MED ORDER — SODIUM CHLORIDE 0.9 % IV SOLN
1.0000 g | Freq: Once | INTRAVENOUS | Status: AC
  Administered 2018-05-27: 1 g via INTRAVENOUS
  Filled 2018-05-27: qty 10

## 2018-05-27 MED ORDER — LIDOCAINE HCL (PF) 1 % IJ SOLN
5.0000 mL | INTRAMUSCULAR | Status: DC | PRN
  Administered 2018-05-27: 5 mL
  Filled 2018-05-27: qty 5

## 2018-05-27 MED ORDER — ENOXAPARIN SODIUM 40 MG/0.4ML ~~LOC~~ SOLN
40.0000 mg | SUBCUTANEOUS | Status: DC
  Administered 2018-05-28 – 2018-05-29 (×3): 40 mg via SUBCUTANEOUS
  Filled 2018-05-27 (×3): qty 0.4

## 2018-05-27 MED ORDER — ACETAMINOPHEN 650 MG RE SUPP: 650.0000 mg | Freq: Four times a day (QID) | RECTAL | Status: DC | PRN
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
## 2018-05-27 NOTE — ED Notes (Signed)
Pt placed on NRB by RRT due to difficulty breathing. VSS.
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
## 2018-05-27 NOTE — ED Notes (Signed)
Respiratory suctioning trach at this time

## 2018-05-27 NOTE — ED Provider Notes (Signed)
I saw and evaluated the patient, reviewed the resident's note and I agree with the findings and plan with the following exceptions.   67 year old male here with dyspnea for couple hours not any better with home nebulizer treatment.   On my evaluation patient is got a little bit of bloody sputum that is from deep suctioning which did not reveal any mucus plugging.  He is tachypneic with accessory muscle use and retractions consistent with respiratory distress.  Breathing approximately 35 times a minute.  Significantly diminished breath sounds with end expiratory wheezing bilaterally. Suspect likely COPD exacerbation.  Will check basic labs and started continuous albuterol with steroids, magnesium.  Suspect will need admission.  May need to go on ventilator.  CRITICAL CARE Performed by: Merrily Pew Total critical care time: 35 minutes Critical care time was exclusive of separately billable procedures and treating other patients. Critical care was necessary to treat or prevent imminent or life-threatening deterioration. Critical care was time spent personally by me on the following activities: development of treatment plan with patient and/or surrogate as well as nursing, discussions with consultants, evaluation of patient's response to treatment, examination of patient, obtaining history from patient or surrogate, ordering and performing treatments and interventions, ordering and review of laboratory studies, ordering and review of radiographic studies, pulse oximetry and re-evaluation of patient's condition.    EKG Interpretation  Date/Time:  Friday May 27 2018 17:08:11 EDT Ventricular Rate:  104 PR Interval:  154 QRS Duration: 80 QT Interval:  320 QTC Calculation: 420 R Axis:   71 Text Interpretation:  Sinus tachycardia Nonspecific ST and T wave abnormality Abnormal ECG similar to aug 22 Confirmed by Merrily Pew [phone]) on 05/27/2018 7:01:34 PM         Uthman Mroczkowski, Corene Cornea, MD 05/28/18  0677

## 2018-05-27 NOTE — ED Triage Notes (Signed)
Pt presents to ED for shortness of breath x2 hours. Pt attempted at home neb tx with no relief.  Pt alert and oriented, but has obvious work of breathing. Pt 96% on room air.

## 2018-05-27 NOTE — ED Notes (Signed)
RN notified respiratory of pt arrival.

## 2018-05-28 ENCOUNTER — Other Ambulatory Visit: Payer: Self-pay

## 2018-05-28 LAB — BASIC METABOLIC PANEL
Anion gap: 9 (ref 5–15)
BUN: 12 mg/dL (ref 8–23)
CHLORIDE: 100 mmol/L (ref 98–111)
CO2: 24 mmol/L (ref 22–32)
CREATININE: 0.75 mg/dL (ref 0.61–1.24)
Calcium: 9.4 mg/dL (ref 8.9–10.3)
GFR calc non Af Amer: >60 mL/min (ref >60)
Glucose, Bld: 145 mg/dL — ABNORMAL HIGH (ref 70–99)
Potassium: 4.5 mmol/L (ref 3.5–5.1)
Sodium: 133 mmol/L — ABNORMAL LOW (ref 135–145)

## 2018-05-28 LAB — CBC
HEMATOCRIT: 42 % (ref 39.0–52.0)
Hemoglobin: 14.1 g/dL (ref 13.0–17.0)
MCH: 30.7 pg (ref 26.0–34.0)
MCHC: 33.6 g/dL (ref 30.0–36.0)
MCV: 91.5 fL (ref 78.0–100.0)
Platelets: 309 10*3/uL (ref 150–400)
RBC: 4.59 MIL/uL (ref 4.22–5.81)
RDW: 13.9 % (ref 11.5–15.5)
WBC: 6.2 10*3/uL (ref 4.0–10.5)

## 2018-05-28 LAB — MAGNESIUM: MAGNESIUM: 2.3 mg/dL (ref 1.7–2.4)

## 2018-05-28 MED ORDER — METHOCARBAMOL 500 MG PO TABS
500.0000 mg | ORAL_TABLET | Freq: Three times a day (TID) | ORAL | Status: DC
  Administered 2018-05-28 – 2018-05-30 (×6): 500 mg via ORAL
  Filled 2018-05-28 (×6): qty 1

## 2018-05-28 MED ORDER — FLUTICASONE PROPIONATE 50 MCG/ACT NA SUSP: 2.0000 | Freq: Every day | NASAL | Status: DC

## 2018-05-28 MED ORDER — IPRATROPIUM-ALBUTEROL 0.5-2.5 (3) MG/3ML IN SOLN
3.0000 mL | Freq: Four times a day (QID) | RESPIRATORY_TRACT | Status: DC
  Administered 2018-05-28 – 2018-05-30 (×8): 3 mL via RESPIRATORY_TRACT
  Filled 2018-05-28 (×8): qty 3

## 2018-05-28 MED ORDER — GLYCOPYRROLATE 0.2 MG/ML IJ SOLN
0.1000 mg | Freq: Three times a day (TID) | INTRAMUSCULAR | Status: DC | PRN
  Administered 2018-05-28: 0.1 mg via INTRAVENOUS
  Filled 2018-05-28: qty 1

## 2018-05-28 MED ORDER — CHLORHEXIDINE GLUCONATE 0.12 % MT SOLN
15.0000 mL | Freq: Two times a day (BID) | OROMUCOSAL | Status: DC
  Administered 2018-05-28 – 2018-05-29 (×5): 15 mL via OROMUCOSAL
  Filled 2018-05-28 (×6): qty 15

## 2018-05-28 MED ORDER — HYDROCHLOROTHIAZIDE 25 MG PO TABS
25.0000 mg | ORAL_TABLET | Freq: Every day | ORAL | Status: DC
  Administered 2018-05-28 – 2018-05-30 (×3): 25 mg via ORAL
  Filled 2018-05-28 (×3): qty 1

## 2018-05-28 MED ORDER — TRAZODONE HCL 50 MG PO TABS
50.0000 mg | ORAL_TABLET | Freq: Once | ORAL | Status: AC
  Administered 2018-05-28: 50 mg via ORAL
  Filled 2018-05-28: qty 1

## 2018-05-28 MED ORDER — SALINE SPRAY 0.65 % NA SOLN
1.0000 | NASAL | Status: DC | PRN
  Filled 2018-05-28: qty 44

## 2018-05-28 MED ORDER — ORAL CARE MOUTH RINSE
15.0000 mL | Freq: Two times a day (BID) | OROMUCOSAL | Status: DC
  Administered 2018-05-28 – 2018-05-29 (×3): 15 mL via OROMUCOSAL
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
## 2018-05-29 MED ORDER — DOXYCYCLINE HYCLATE 100 MG PO TABS
100.0000 mg | ORAL_TABLET | Freq: Two times a day (BID) | ORAL | Status: DC
  Administered 2018-05-29 – 2018-05-30 (×3): 100 mg via ORAL
  Filled 2018-05-29 (×3): qty 1

## 2018-05-29 MED ORDER — ACETAMINOPHEN-CODEINE #3 300-30 MG PO TABS
1.0000 | ORAL_TABLET | ORAL | Status: DC | PRN
  Administered 2018-05-29 – 2018-05-30 (×3): 1 via ORAL
  Filled 2018-05-29 (×3): qty 1

## 2018-05-29 MED ORDER — BENZONATATE 100 MG PO CAPS
200.0000 mg | ORAL_CAPSULE | Freq: Two times a day (BID) | ORAL | Status: DC | PRN
  Administered 2018-05-29 (×2): 200 mg via ORAL
  Filled 2018-05-29 (×2): qty 2

## 2018-05-29 NOTE — Progress Notes (Signed)
SATURATION QUALIFICATIONS: (This note is used to comply with regulatory documentation for home oxygen)  Patient Saturations on Room Air at Rest = 95%  Patient Saturations on Room Air while Ambulating = 91%  Patient Saturations on 35% oxygen while Ambulating = 100%  Patient tolerated walking 252ft on room air without dropping below 90%. He did cough, but was able to talk while walking.
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
## 2018-05-30 LAB — BASIC METABOLIC PANEL
ANION GAP: 10 (ref 5–15)
BUN: 21 mg/dL (ref 8–23)
CO2: 24 mmol/L (ref 22–32)
Calcium: 9.8 mg/dL (ref 8.9–10.3)
Chloride: 98 mmol/L (ref 98–111)
Creatinine, Ser: 0.81 mg/dL (ref 0.61–1.24)
GFR calc Af Amer: >60 mL/min (ref >60)
Glucose, Bld: 146 mg/dL — ABNORMAL HIGH (ref 70–99)
POTASSIUM: 4.4 mmol/L (ref 3.5–5.1)
Sodium: 132 mmol/L — ABNORMAL LOW (ref 135–145)

## 2018-05-30 LAB — CBC
HEMATOCRIT: 42.7 % (ref 39.0–52.0)
Hemoglobin: 14.1 g/dL (ref 13.0–17.0)
MCH: 30.2 pg (ref 26.0–34.0)
MCHC: 33 g/dL (ref 30.0–36.0)
MCV: 91.4 fL (ref 78.0–100.0)
Platelets: 307 10*3/uL (ref 150–400)
RBC: 4.67 MIL/uL (ref 4.22–5.81)
RDW: 14.1 % (ref 11.5–15.5)
WBC: 7.9 10*3/uL (ref 4.0–10.5)

## 2018-05-30 MED ORDER — HYDROCHLOROTHIAZIDE 25 MG PO TABS: 25.0000 mg | ORAL_TABLET | Freq: Every day | ORAL | 0 refills | Status: AC

## 2018-05-30 MED ORDER — METHOCARBAMOL 500 MG PO TABS: 500.0000 mg | ORAL_TABLET | Freq: Three times a day (TID) | ORAL | 0 refills | Status: AC | PRN

## 2018-05-30 MED ORDER — IPRATROPIUM-ALBUTEROL 0.5-2.5 (3) MG/3ML IN SOLN
3.0000 mL | Freq: Three times a day (TID) | RESPIRATORY_TRACT | Status: DC
  Administered 2018-05-30: 3 mL via RESPIRATORY_TRACT
  Filled 2018-05-30: qty 3

## 2018-05-30 MED ORDER — ACETAMINOPHEN-CODEINE #3 300-30 MG PO TABS: 1.0000 | ORAL_TABLET | Freq: Three times a day (TID) | ORAL | 0 refills | Status: AC | PRN

## 2018-05-30 MED ORDER — SALINE SPRAY 0.65 % NA SOLN: 1.0000 | NASAL | 0 refills | Status: DC | PRN

## 2018-05-30 MED ORDER — BENZONATATE 200 MG PO CAPS: 200.0000 mg | ORAL_CAPSULE | Freq: Two times a day (BID) | ORAL | 0 refills | Status: DC | PRN

## 2018-05-30 MED ORDER — PREDNISONE 10 MG PO TABS: ORAL_TABLET | ORAL | 0 refills | Status: DC

## 2018-05-30 MED ORDER — DOXYCYCLINE HYCLATE 100 MG PO TABS: 100.0000 mg | ORAL_TABLET | Freq: Two times a day (BID) | ORAL | 0 refills | Status: AC

## 2018-05-30 NOTE — Care Management Note (Signed)
Case Management Note  Patient Details  Name: Orien Mayhall MRN: [id number] Date of Birth: Nov 23, 1950  Subjective/Objective:   Patient for dc today, he lives alone pta indep, he has chronic trach, he does his own trach care, he gets his supplies from [facility], he states he is on humidified air.  He will be transported home via car by his girlfriend.                  Action/Plan: DC home when ready.  Expected Discharge Date:  05/30/18               Expected Discharge Plan:  Home/Self Care  In-House Referral:     Discharge planning Services  CM Consult  Post Acute Care Choice:    Choice offered to:     DME Arranged:    DME Agency:     HH Arranged:    HH Agency:     Status of Service:  Completed, signed off  If discussed at [employer] of Stay Meetings, dates discussed:    Additional Comments:  Zenon Mayo, RN 05/30/2018, 8:52 AM

## 2018-05-31 DIAGNOSIS — J9601 Acute respiratory failure with hypoxia: Secondary | ICD-10-CM | POA: Diagnosis not present
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
## 2018-06-04 ENCOUNTER — Encounter (HOSPITAL_COMMUNITY): Payer: Self-pay | Admitting: *Deleted

## 2018-06-04 NOTE — Progress Notes (Signed)
Patient denies chest pain or shortness of breath. Spoke to patient for preop call. Denies current O2 use with trach, able to speak in full sentences without cough, and clear voice quality.  Patient states he is unable to read and requested I contact daughter and provide preop instructions. Contacted daughter as patient requested and provided instructions.

## 2018-06-06 ENCOUNTER — Other Ambulatory Visit: Payer: Self-pay | Admitting: Otolaryngology

## 2018-06-06 NOTE — Anesthesia Preprocedure Evaluation (Addendum)
Anesthesia Evaluation  Patient identified by MRN, date of birth, ID band Patient awake    Reviewed: Allergy & Precautions, NPO status , Patient's Chart, lab work & pertinent test results, reviewed documented beta blocker date and time   History of Anesthesia Complications Negative for: history of anesthetic complications  Airway       Comment:  Tracheostomy Dental   Pulmonary asthma , COPD,  COPD inhaler,   Tracheostomy B/l complete vocal cord paralysis    breath sounds clear to auscultation       Cardiovascular hypertension, Pt. on medications  Rhythm:Regular Rate:Normal   '17 TTE - EF 55% to 60%. RA was mildly dilated. PASP was mildly increased. PA peak pressure: 43 mm Hg.     Neuro/Psych Anxiety negative neurological ROS     GI/Hepatic negative GI ROS, (+)     substance abuse  alcohol use and marijuana use,   Endo/Other  negative endocrine ROS  Renal/GU negative Renal ROS  negative genitourinary   Musculoskeletal  (+) Arthritis ,   Abdominal   Peds  Hematology negative hematology ROS (+)   Anesthesia Other Findings   Reproductive/Obstetrics                            Anesthesia Physical Anesthesia Plan  ASA: III  Anesthesia Plan: General   Post-op Pain Management:    Induction: Inhalational  PONV Risk Score and Plan: 3 and Treatment may vary due to age or medical condition, Ondansetron and Dexamethasone  Airway Management Planned: Tracheostomy  Additional Equipment: None  Intra-op Plan:   Post-operative Plan: Extubation in OR  Informed Consent: I have reviewed the patients History and Physical, chart, labs and discussed the procedure including the risks, benefits and alternatives for the proposed anesthesia with the patient or authorized representative who has indicated his/her understanding and acceptance.     Plan Discussed with: CRNA and  Anesthesiologist  Anesthesia Plan Comments: ( Will have cuffed 6.0 trach tube to exchange for patient's uncuffed Shiley)       Anesthesia Quick Evaluation
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
## 2018-06-07 ENCOUNTER — Inpatient Hospital Stay (HOSPITAL_COMMUNITY): Payer: Medicare Other | Admitting: Vascular Surgery

## 2018-06-07 ENCOUNTER — Encounter (HOSPITAL_COMMUNITY): Payer: Self-pay | Admitting: Surgery

## 2018-06-07 ENCOUNTER — Observation Stay (HOSPITAL_COMMUNITY)
Admission: RE | Admit: 2018-06-07 | Discharge: 2018-06-08 | Disposition: A | Discharge status: Home / Self Care | Payer: Medicare Other | Source: Ambulatory Visit | Attending: Otolaryngology | Admitting: Otolaryngology

## 2018-06-07 ENCOUNTER — Encounter (HOSPITAL_COMMUNITY)
Admission: RE | Disposition: A | Discharge status: Home / Self Care | Payer: Self-pay | Source: Ambulatory Visit | Attending: Otolaryngology

## 2018-06-07 ENCOUNTER — Other Ambulatory Visit: Payer: Self-pay

## 2018-06-07 DIAGNOSIS — M1990 Unspecified osteoarthritis, unspecified site: Secondary | ICD-10-CM | POA: Insufficient documentation

## 2018-06-07 DIAGNOSIS — J449 Chronic obstructive pulmonary disease, unspecified: Secondary | ICD-10-CM | POA: Diagnosis not present

## 2018-06-07 DIAGNOSIS — Z7951 Long term (current) use of inhaled steroids: Secondary | ICD-10-CM | POA: Insufficient documentation

## 2018-06-07 DIAGNOSIS — J320 Chronic maxillary sinusitis: Secondary | ICD-10-CM | POA: Diagnosis not present

## 2018-06-07 DIAGNOSIS — J329 Chronic sinusitis, unspecified: Secondary | ICD-10-CM | POA: Diagnosis not present

## 2018-06-07 DIAGNOSIS — J321 Chronic frontal sinusitis: Secondary | ICD-10-CM | POA: Diagnosis not present

## 2018-06-07 DIAGNOSIS — Z809 Family history of malignant neoplasm, unspecified: Secondary | ICD-10-CM | POA: Insufficient documentation

## 2018-06-07 DIAGNOSIS — Z79899 Other long term (current) drug therapy: Secondary | ICD-10-CM | POA: Insufficient documentation

## 2018-06-07 DIAGNOSIS — R0600 Dyspnea, unspecified: Secondary | ICD-10-CM | POA: Diagnosis not present

## 2018-06-07 DIAGNOSIS — J322 Chronic ethmoidal sinusitis: Secondary | ICD-10-CM | POA: Diagnosis not present

## 2018-06-07 DIAGNOSIS — I10 Essential (primary) hypertension: Secondary | ICD-10-CM | POA: Diagnosis not present

## 2018-06-07 DIAGNOSIS — Z825 Family history of asthma and other chronic lower respiratory diseases: Secondary | ICD-10-CM | POA: Diagnosis not present

## 2018-06-07 DIAGNOSIS — Z86010 Personal history of colonic polyps: Secondary | ICD-10-CM | POA: Diagnosis not present

## 2018-06-07 DIAGNOSIS — F419 Anxiety disorder, unspecified: Secondary | ICD-10-CM | POA: Diagnosis not present

## 2018-06-07 DIAGNOSIS — J324 Chronic pansinusitis: Principal | ICD-10-CM | POA: Insufficient documentation

## 2018-06-07 DIAGNOSIS — R001 Bradycardia, unspecified: Secondary | ICD-10-CM | POA: Insufficient documentation

## 2018-06-07 HISTORY — PX: SINUS ENDO WITH FUSION: SHX5329

## 2018-06-07 SURGERY — SURGERY, PARANASAL SINUS, ENDOSCOPIC, WITH NASAL SEPTOPLASTY, TURBINOPLASTY, AND MAXILLARY SINUSOTOMY
Anesthesia: General | Site: Nose | Laterality: Bilateral

## 2018-06-07 MED ORDER — OXYCODONE HCL 5 MG/5ML PO SOLN: 5.0000 mg | Freq: Once | ORAL | Status: DC | PRN

## 2018-06-07 MED ORDER — ONDANSETRON 4 MG PO TBDP: 4.0000 mg | ORAL_TABLET | Freq: Four times a day (QID) | ORAL | Status: DC | PRN

## 2018-06-07 MED ORDER — FINASTERIDE 5 MG PO TABS
5.0000 mg | ORAL_TABLET | Freq: Every day | ORAL | Status: DC
  Administered 2018-06-08: 5 mg via ORAL
  Filled 2018-06-07: qty 1

## 2018-06-07 MED ORDER — ONDANSETRON HCL 4 MG/2ML IJ SOLN: 4.0000 mg | Freq: Four times a day (QID) | INTRAMUSCULAR | Status: DC | PRN

## 2018-06-07 MED ORDER — METHYLPREDNISOLONE SODIUM SUCC 125 MG IJ SOLR
80.0000 mg | Freq: Two times a day (BID) | INTRAMUSCULAR | Status: DC
  Administered 2018-06-07 – 2018-06-08 (×3): 80 mg via INTRAVENOUS
  Filled 2018-06-07 (×3): qty 2

## 2018-06-07 MED ORDER — ONDANSETRON HCL 4 MG/2ML IJ SOLN: 4.0000 mg | Freq: Once | INTRAMUSCULAR | Status: DC | PRN

## 2018-06-07 MED ORDER — PROPOFOL 10 MG/ML IV BOLUS
INTRAVENOUS | Status: AC
  Filled 2018-06-07: qty 20

## 2018-06-07 MED ORDER — TERAZOSIN HCL 5 MG PO CAPS
5.0000 mg | ORAL_CAPSULE | Freq: Every day | ORAL | Status: DC
  Administered 2018-06-07: 5 mg via ORAL
  Filled 2018-06-07: qty 1

## 2018-06-07 MED ORDER — PROPOFOL 10 MG/ML IV BOLUS
INTRAVENOUS | Status: DC | PRN
  Administered 2018-06-07: 100 mg via INTRAVENOUS

## 2018-06-07 MED ORDER — BISACODYL 5 MG PO TBEC: 10.0000 mg | DELAYED_RELEASE_TABLET | Freq: Every day | ORAL | Status: DC | PRN

## 2018-06-07 MED ORDER — DEXAMETHASONE SODIUM PHOSPHATE 10 MG/ML IJ SOLN
INTRAMUSCULAR | Status: DC | PRN
  Administered 2018-06-07: 8 mg via INTRAVENOUS

## 2018-06-07 MED ORDER — FENTANYL CITRATE (PF) 100 MCG/2ML IJ SOLN: 25.0000 ug | INTRAMUSCULAR | Status: DC | PRN

## 2018-06-07 MED ORDER — LIDOCAINE-EPINEPHRINE 1 %-1:100000 IJ SOLN
INTRAMUSCULAR | Status: AC
  Filled 2018-06-07: qty 1

## 2018-06-07 MED ORDER — EPHEDRINE SULFATE-NACL 50-0.9 MG/10ML-% IV SOSY
PREFILLED_SYRINGE | INTRAVENOUS | Status: DC | PRN
  Administered 2018-06-07 (×2): 10 mg via INTRAVENOUS

## 2018-06-07 MED ORDER — HYDROCODONE-ACETAMINOPHEN 5-325 MG PO TABS
1.0000 | ORAL_TABLET | ORAL | Status: DC | PRN
  Administered 2018-06-07: 2 via ORAL
  Administered 2018-06-07: 1 via ORAL
  Filled 2018-06-07: qty 2
  Filled 2018-06-07: qty 1

## 2018-06-07 MED ORDER — ROCURONIUM BROMIDE 10 MG/ML (PF) SYRINGE
PREFILLED_SYRINGE | INTRAVENOUS | Status: DC | PRN
  Administered 2018-06-07: 30 mg via INTRAVENOUS

## 2018-06-07 MED ORDER — SODIUM CHLORIDE 0.9 % IR SOLN
Status: DC | PRN
  Administered 2018-06-07: 1

## 2018-06-07 MED ORDER — OXYMETAZOLINE HCL 0.05 % NA SOLN
NASAL | Status: AC
  Filled 2018-06-07: qty 15

## 2018-06-07 MED ORDER — SODIUM CHLORIDE 0.9 % IV SOLN
INTRAVENOUS | Status: DC | PRN
  Administered 2018-06-07: 40 ug/min via INTRAVENOUS

## 2018-06-07 MED ORDER — BACITRACIN ZINC 500 UNIT/GM EX OINT
TOPICAL_OINTMENT | CUTANEOUS | Status: DC | PRN
  Administered 2018-06-07: 1 via TOPICAL

## 2018-06-07 MED ORDER — PANTOPRAZOLE SODIUM 40 MG PO TBEC: 40.0000 mg | DELAYED_RELEASE_TABLET | Freq: Every day | ORAL | Status: DC

## 2018-06-07 MED ORDER — ADULT MULTIVITAMIN W/MINERALS CH
1.0000 | ORAL_TABLET | Freq: Every day | ORAL | Status: DC
  Administered 2018-06-08: 1 via ORAL
  Filled 2018-06-07: qty 1

## 2018-06-07 MED ORDER — FENTANYL CITRATE (PF) 250 MCG/5ML IJ SOLN
INTRAMUSCULAR | Status: DC | PRN
  Administered 2018-06-07: 50 ug via INTRAVENOUS
  Administered 2018-06-07: 100 ug via INTRAVENOUS

## 2018-06-07 MED ORDER — MIDAZOLAM HCL 2 MG/2ML IJ SOLN
INTRAMUSCULAR | Status: AC
  Filled 2018-06-07: qty 2

## 2018-06-07 MED ORDER — LACTATED RINGERS IV SOLN
INTRAVENOUS | Status: DC | PRN
  Administered 2018-06-07: 08:00:00 via INTRAVENOUS

## 2018-06-07 MED ORDER — DEXTROMETHORPHAN POLISTIREX ER 30 MG/5ML PO SUER: 15.0000 mg | Freq: Two times a day (BID) | ORAL | Status: DC | PRN

## 2018-06-07 MED ORDER — ALBUTEROL SULFATE HFA 108 (90 BASE) MCG/ACT IN AERS: 2.0000 | INHALATION_SPRAY | Freq: Four times a day (QID) | RESPIRATORY_TRACT | Status: DC | PRN

## 2018-06-07 MED ORDER — LOSARTAN POTASSIUM 50 MG PO TABS
25.0000 mg | ORAL_TABLET | Freq: Every day | ORAL | Status: DC
  Administered 2018-06-08: 25 mg via ORAL
  Filled 2018-06-07: qty 1

## 2018-06-07 MED ORDER — MIDAZOLAM HCL 5 MG/5ML IJ SOLN
INTRAMUSCULAR | Status: DC | PRN
  Administered 2018-06-07: 2 mg via INTRAVENOUS

## 2018-06-07 MED ORDER — DEXTROSE-NACL 5-0.45 % IV SOLN
INTRAVENOUS | Status: DC
  Administered 2018-06-07 (×2): via INTRAVENOUS

## 2018-06-07 MED ORDER — HYDROCHLOROTHIAZIDE 25 MG PO TABS
25.0000 mg | ORAL_TABLET | Freq: Every day | ORAL | Status: DC
  Administered 2018-06-08: 25 mg via ORAL
  Filled 2018-06-07: qty 1

## 2018-06-07 MED ORDER — OXYCODONE HCL 5 MG PO TABS: 5.0000 mg | ORAL_TABLET | Freq: Once | ORAL | Status: DC | PRN

## 2018-06-07 MED ORDER — ONDANSETRON HCL 4 MG/2ML IJ SOLN
INTRAMUSCULAR | Status: DC | PRN
  Administered 2018-06-07: 4 mg via INTRAVENOUS

## 2018-06-07 MED ORDER — SALINE SPRAY 0.65 % NA SOLN
1.0000 | NASAL | Status: DC | PRN
  Administered 2018-06-07: 1 via NASAL
  Filled 2018-06-07: qty 44

## 2018-06-07 MED ORDER — OXYMETAZOLINE HCL 0.05 % NA SOLN
NASAL | Status: DC | PRN
  Administered 2018-06-07: 1

## 2018-06-07 MED ORDER — LIDOCAINE-EPINEPHRINE 1 %-1:100000 IJ SOLN
INTRAMUSCULAR | Status: DC | PRN
  Administered 2018-06-07: 4 mL

## 2018-06-07 MED ORDER — SUGAMMADEX SODIUM 200 MG/2ML IV SOLN
INTRAVENOUS | Status: DC | PRN
  Administered 2018-06-07: 200 mg via INTRAVENOUS

## 2018-06-07 MED ORDER — LIDOCAINE 2% (20 MG/ML) 5 ML SYRINGE
INTRAMUSCULAR | Status: DC | PRN
  Administered 2018-06-07: 80 mg via INTRAVENOUS

## 2018-06-07 MED ORDER — ALBUTEROL SULFATE (2.5 MG/3ML) 0.083% IN NEBU
2.5000 mg | INHALATION_SOLUTION | RESPIRATORY_TRACT | Status: DC | PRN
  Administered 2018-06-07 – 2018-06-08 (×2): 2.5 mg via RESPIRATORY_TRACT
  Filled 2018-06-07 (×2): qty 3

## 2018-06-07 MED ORDER — FENTANYL CITRATE (PF) 250 MCG/5ML IJ SOLN
INTRAMUSCULAR | Status: AC
  Filled 2018-06-07: qty 5

## 2018-06-07 MED ORDER — BACITRACIN ZINC 500 UNIT/GM EX OINT
TOPICAL_OINTMENT | CUTANEOUS | Status: AC
  Filled 2018-06-07: qty 28.35

## 2018-06-07 SURGICAL SUPPLY — 46 items
BLADE RAD40 ROTATE 4M 4 5PK (BLADE) IMPLANT
BLADE RAD40 ROTATE 4M 4MM 5PK (BLADE)
BLADE RAD60 ROTATE M4 4 5PK (BLADE) IMPLANT
BLADE RAD60 ROTATE M4 4MM 5PK (BLADE)
BLADE ROTATE RAD 40 4 M4 (BLADE) ×2 IMPLANT
BLADE ROTATE RAD 40 4MM M4 (BLADE) ×1
BLADE ROTATE TRICUT 4MX13CM M4 (BLADE) ×1
BLADE ROTATE TRICUT 4X13 M4 (BLADE) ×2 IMPLANT
BLADE TRICUT ROTATE M4 4 5PK (BLADE) ×2 IMPLANT
BLADE TRICUT ROTATE M4 4MM 5PK (BLADE) ×1
CANISTER SUCT 3000ML PPV (MISCELLANEOUS) ×6 IMPLANT
COAGULATOR SUCT 6 FR SWTCH (ELECTROSURGICAL) ×1
COAGULATOR SUCT SWTCH 10FR 6 (ELECTROSURGICAL) ×1 IMPLANT
CRADLE DONUT ADULT HEAD (MISCELLANEOUS) ×2 IMPLANT
DRAPE HALF SHEET 40X57 (DRAPES) IMPLANT
DRESSING NASAL KENNEDY 3.5X.9 (MISCELLANEOUS) IMPLANT
DRESSING TELFA 8X10 (GAUZE/BANDAGES/DRESSINGS) IMPLANT
DRSG NASAL KENNEDY 3.5X.9 (MISCELLANEOUS)
DRSG NASOPORE 8CM (GAUZE/BANDAGES/DRESSINGS) ×2 IMPLANT
ELECT REM PT RETURN 9FT ADLT (ELECTROSURGICAL)
ELECTRODE REM PT RTRN 9FT ADLT (ELECTROSURGICAL) IMPLANT
FILTER ARTHROSCOPY CONVERTOR (FILTER) ×2 IMPLANT
GLOVE BIO SURGEON STRL SZ7.5 (GLOVE) ×6 IMPLANT
GOWN STRL REUS W/ TWL LRG LVL3 (GOWN DISPOSABLE) ×2 IMPLANT
GOWN STRL REUS W/TWL LRG LVL3 (GOWN DISPOSABLE) ×6
KIT BASIN OR (CUSTOM PROCEDURE TRAY) ×3 IMPLANT
KIT TURNOVER KIT B (KITS) ×3 IMPLANT
NDL HYPO 25GX1X1/2 BEV (NEEDLE) IMPLANT
NEEDLE HYPO 25GX1X1/2 BEV (NEEDLE) IMPLANT
NS IRRIG 1000ML POUR BTL (IV SOLUTION) ×3 IMPLANT
PAD ARMBOARD 7.5X6 YLW CONV (MISCELLANEOUS) ×6 IMPLANT
PATTIES SURGICAL .5 X3 (DISPOSABLE) ×3 IMPLANT
SOLUTION ANTI FOG 6CC (MISCELLANEOUS) ×3 IMPLANT
SPECIMEN JAR SMALL (MISCELLANEOUS) ×6 IMPLANT
SUT ETHILON 3 0 PS 1 (SUTURE) IMPLANT
SUT PLAIN 4 0 ~~LOC~~ 1 (SUTURE) IMPLANT
SWAB COLLECTION DEVICE MRSA (MISCELLANEOUS) IMPLANT
SWAB CULTURE ESWAB REG 1ML (MISCELLANEOUS) IMPLANT
TOWEL NATURAL 6PK STERILE (DISPOSABLE) ×3 IMPLANT
TRACKER ENT INSTRUMENT (MISCELLANEOUS) ×3 IMPLANT
TRACKER ENT PATIENT (MISCELLANEOUS) ×3 IMPLANT
TRAY ENT MC OR (CUSTOM PROCEDURE TRAY) ×3 IMPLANT
TUBE CONNECTING 12'X1/4 (SUCTIONS) ×1
TUBE CONNECTING 12X1/4 (SUCTIONS) ×2 IMPLANT
TUBING STRAIGHTSHOT EPS 5PK (TUBING) ×3 IMPLANT
WATER STERILE IRR 1000ML POUR (IV SOLUTION) ×3 IMPLANT

## 2018-06-07 NOTE — Transfer of Care (Signed)
Immediate Anesthesia Transfer of Care Note  Patient: [name] [name]  Procedure(s) Performed: SINUS ENDO WITH FUSION (Bilateral Nose)  Patient Location: PACU  Anesthesia Type:General  Level of Consciousness: awake, alert  and oriented  Airway & Oxygen Therapy: Patient Spontanous Breathing and Patient connected to face mask oxygen covering trach  Post-op Assessment: Report given to RN and Post -op Vital signs reviewed and stable  Post vital signs: Reviewed and stable  Last Vitals:  Vitals Value Taken Time  BP    Temp    Pulse    Resp    SpO2      Last Pain:  Vitals:   06/07/18 0639  TempSrc: Oral  PainSc:          Complications: No apparent anesthesia complications

## 2018-06-07 NOTE — Op Note (Signed)
Preop/postop diagnosis: Chronic sinusitis Procedure: Bilateral max antrostomy, bilateral total ethmoidectomy, bilateral frontal sinusotomy, fusion computer guidance Anesthesia: Gen. Estimated blood loss: Approximately 100 mL Indications: 67 year old with chronic sinus disease and failed medical therapy. He has a tracheotomy that is to be removed at a later date by [place] but left in for this procedure. Patient was informed risk and benefits of the procedure and options were discussed all questions are answered and consent was obtained. Procedure: Patient taken the operating room placed in the supine position after general endotracheal tube anesthesia through the tracheotomy site with a #6 cuffed trach the patient was prepped and draped in usual sterile manner. The fusion computer guidance system was positioned calibrated with excellent accuracy. The nose was injected of the inferior and middle turbinates 1% lidocaine with 1 100,000 epinephrine. The left side was begun using the microdebrider fusion guidance the uncinate process was removedup to the attachment of the middle turbinate.[name] [name] is open widely. There was some thick mucous present. The bulla was opened and the dissection of the ethmoid was carried out from anterior to posterior with the microdebrider and fusion guidance. There was thickened tissue and some polypoid changes within the cavity. The nasofrontal duct was then opened with a curved suction and and the curved microdebrider sided. There was also thickening in the nasofrontal duct. Pledgets were placed. Right-sided repeated in same fashion with same dissection parameters and thickened tissue throughout.  The pledgets were placed. The nasopharynx was suctioned out of all blood and debris and nasal pore soaked in bacitracin were placed in the ethmoid cavities bilaterally. There was good hemostasis.the #6 cuffed Shiley was then removed and his uncuffed 6 was placed without  difficulty. There was good hemostasis. The patient was then awakened brought to recovery in stable condition counts correct

## 2018-06-07 NOTE — Progress Notes (Signed)
Received patient from PACU. S/P sinus surgery. No drainage. Patient alert and oriented x4. #6 Shiley trach in place. Suction set up at bedside. No complaints of pain at this time. Respiratory came and assessed patient. Patient oriented to call bel and room. Will continue to monitor.

## 2018-06-07 NOTE — Anesthesia Procedure Notes (Signed)
Procedure Name: Intubation Date/Time: 06/07/2018 7:39 AM Performed by: Audry Pili, MD Pre-anesthesia Checklist: Patient identified, Suction available, Emergency Drugs available, Patient being monitored and Timeout performed Patient Re-evaluated:Patient Re-evaluated prior to induction Oxygen Delivery Method: Circle system utilized Preoxygenation: Pre-oxygenation with 100% oxygen Induction Type: IV induction and Tracheostomy Tube size: 6.0 mm Number of attempts: 1 (Dr. Janace Hoard at bedside - trach exchange performed successfully by Dr. Janace Hoard.) Airway Equipment and Method: Tracheostomy Placement Confirmation: positive ETCO2 and breath sounds checked- equal and bilateral Tube secured with: Trach Collar. Dental Injury: Teeth and Oropharynx as per pre-operative assessment  Comments: 6.0 uncuffed shiley exchanged for 6.0 cuffed shiley by Dr. Janace Hoard

## 2018-06-07 NOTE — H&P (Signed)
[name] [name] is an 67 y.o. male.   Chief Complaint: sinusitis HPI: hx of chronic sinusitis and ready for surgery  Past Medical History:  Diagnosis Date  . Abnormal EKG 12/24/11   anteroseptal and lateral ST elevation, felt r/t early repolarization;  Cardiac cath 12/24/11 - normal coronary anatomy, EF 55-65%  . Anxiety   . Arthritis   . Asthma   . Bradycardia, sinus 12/24/11  . COPD (chronic obstructive pulmonary disease) ([place])   . Dyspnea   . H/O tracheostomy   . History of alcohol abuse    hospitalized for detox 2002  . HTN (hypertension)   . Hypotension 12/24/11   in the setting of dehydration   . Marijuana use   . Pneumonia   . Syncope and collapse 12/24/11   2/2 hypotension in the setting of dehydration  . Upper airway cough syndrome     Past Surgical History:  Procedure Laterality Date  . CARDIAC CATHETERIZATION  12/24/11   normal coronary anatomy, EF 55-65%  . CIRCUMCISION  1972  . COLONOSCOPY WITH PROPOFOL N/A 02/24/2018   Procedure: COLONOSCOPY WITH PROPOFOL;  Surgeon: Otis Brace, MD;  [facility];  Service: Gastroenterology;  Laterality: N/A;  . ESOPHAGOGASTRODUODENOSCOPY N/A 10/09/2015   Procedure: ESOPHAGOGASTRODUODENOSCOPY (EGD);  Surgeon: Judeth Horn, MD;  Location: [facility] ENDOSCOPY;  Service: General;  Laterality: N/A;  . IRRIGATION AND DEBRIDEMENT KNEE Right 05/23/2017   Procedure: IRRIGATION AND DEBRIDEMENT KNEE;  Surgeon: Wylene Simmer, MD;  [facility];  Service: Orthopedics;  Laterality: Right;  . LACERATION REPAIR  02/2004   arthroscopic debridement of triagular fibrocartilage tear/E-chart; right wrist  . LEFT HEART CATHETERIZATION WITH CORONARY ANGIOGRAM N/A 12/24/2011   Procedure: LEFT HEART CATHETERIZATION WITH CORONARY ANGIOGRAM;  Surgeon: Peter M Martinique, MD;  Location: [facility] CATH LAB;  Service: Cardiovascular;  Laterality: N/A;  . PEG PLACEMENT N/A 10/09/2015   Procedure: PERCUTANEOUS ENDOSCOPIC GASTROSTOMY (PEG) PLACEMENT;  Surgeon: Judeth Horn, MD;  Location: [place];  Service: General;  Laterality: N/A;  . PEG TUBE REMOVAL    . PERCUTANEOUS TRACHEOSTOMY N/A 10/09/2015   Procedure: PERCUTANEOUS TRACHEOSTOMY;  Surgeon: Judeth Horn, MD;  Location: [place];  Service: General;  Laterality: N/A;  . POLYPECTOMY  02/24/2018   Procedure: POLYPECTOMY;  Surgeon: Otis Brace, MD;  [facility];  Service: Gastroenterology;;    Family History  Problem Relation Age of [place]set  . COPD Sister   . Cancer Sister   . Asthma Other    Social History:  reports that he has never smoked. He has never used smokeless tobacco. He reports that he drinks alcohol. He reports that he does not use drugs.  Allergies: No Known Allergies  Medications Prior to Admission  Medication Sig Dispense Refill  . albuterol (PROVENTIL HFA;VENTOLIN HFA) 108 (90 Base) MCG/ACT inhaler Inhale 2 puffs into the lungs every 6 (six) hours as needed for wheezing or shortness of breath. 1 Inhaler 2  . albuterol (PROVENTIL) (2.5 MG/3ML) 0.083% nebulizer solution Take 3-6 mLs (2.5-5 mg total) by nebulization every 4 (four) hours as needed for wheezing or shortness of breath. 30 vial 0  . benzonatate (TESSALON) 200 MG capsule Take 1 capsule (200 mg total) by mouth 2 (two) times daily as needed for cough. 20 capsule 0  . BISACODYL 5 MG EC tablet Take 10 mg by mouth daily as needed for moderate constipation.   0  . cetirizine (ZYRTEC) 10 MG tablet Take 10 mg by mouth daily as needed for allergies.   2  .  famotidine (PEPCID) 20 MG tablet Take 1 tablet (20 mg total) by mouth at bedtime. One at bedtime (Patient taking differently: Take 20 mg by mouth daily. ) 30 tablet 2  . finasteride (PROSCAR) 5 MG tablet Take 5 mg by mouth daily.     . fluticasone (FLONASE) 50 MCG/ACT nasal spray Place 2 sprays into both nostrils daily as needed for allergies.     . Glycopyrrolate-Formoterol (BEVESPI AEROSPHERE) 9-4.8 MCG/ACT AERO Inhale 2 puffs into the lungs 2 (two) times daily. 1  Inhaler 11  . hydrochlorothiazide (HYDRODIURIL) 25 MG tablet Take 1 tablet (25 mg total) by mouth daily. 30 tablet 0  . losartan (COZAAR) 25 MG tablet Take 1 tablet by mouth daily.    . Multiple Vitamin (MULTIVITAMIN) tablet Take 1 tablet by mouth daily.    . Naphazoline HCl (CLEAR EYES OP) Place 1 drop into both eyes daily as needed (dry eyes).     . predniSONE (DELTASONE) 10 MG tablet Take 40mg  daily for 3days,Take 30mg  daily for 3days,Take 20mg  daily for 3days,Take 10mg  daily for 3days, then stop 30 tablet 0  . sodium chloride (OCEAN) 0.65 % SOLN nasal spray Place 1 spray into both nostrils as needed for congestion. 1 Bottle 0  . terazosin (HYTRIN) 5 MG capsule Take 5 mg by mouth at bedtime.    Marland Kitchen dextromethorphan (DELSYM) 30 MG/5ML liquid Take 2.5 mLs (15 mg total) by mouth 2 (two) times daily as needed for cough. 89 mL 0  . pantoprazole (PROTONIX) 40 MG tablet Take 1 tablet (40 mg total) by mouth daily. Take 30-60 min before first meal of the day 30 tablet 2    No results found for this or any previous visit (from the past 48 hour(s)). No results found.  Review of Systems  Constitutional: Negative.   HENT: Negative.   Eyes: Negative.   Respiratory: Negative.   Cardiovascular: Negative.   Skin: Negative.     Blood pressure 113/73, pulse 64, temperature 97.6 F (36.4 C), temperature source Oral, resp. rate 18, SpO2 99 %. Physical Exam  Constitutional: He appears well-developed and well-nourished.  HENT:  Head: Normocephalic and atraumatic.  Mouth/Throat: Oropharynx is clear and moist.  Eyes: Pupils are equal, round, and reactive to light. Conjunctivae are normal.  Neck: Normal range of motion. Neck supple.  Cardiovascular: Normal rate.  Respiratory: Effort normal.  GI: Soft.  Musculoskeletal: Normal range of motion.     Assessment/Plan Chronic sinusitis- discussed ESS and ready to proceed  Melissa Montane, MD 06/07/2018, 7:16 AM

## 2018-06-07 NOTE — Anesthesia Postprocedure Evaluation (Signed)
Anesthesia Post Note  Patient: [name]  Procedure(s) Performed: SINUS ENDO WITH FUSION (Bilateral Nose)     Patient location during evaluation: PACU Anesthesia Type: General Level of consciousness: awake and alert Pain management: pain level controlled Vital Signs Assessment: post-procedure vital signs reviewed and stable Respiratory status: spontaneous breathing, nonlabored ventilation, respiratory function stable and patient connected to tracheostomy mask oxygen Cardiovascular status: blood pressure returned to baseline and stable Postop Assessment: no apparent nausea or vomiting Anesthetic complications: no    Last Vitals:  Vitals:   06/07/18 0930 06/07/18 0956  BP:  129/72  Pulse: 78 68  Resp: 15 15  Temp:  36.5 C  SpO2: 96% 98%    Last Pain:  Vitals:   06/07/18 0956  TempSrc: Oral  PainSc:                  Audry Pili

## 2018-06-08 ENCOUNTER — Encounter (HOSPITAL_COMMUNITY): Payer: Self-pay | Admitting: Otolaryngology

## 2018-06-08 DIAGNOSIS — Z825 Family history of asthma and other chronic lower respiratory diseases: Secondary | ICD-10-CM | POA: Diagnosis not present

## 2018-06-08 DIAGNOSIS — Z79899 Other long term (current) drug therapy: Secondary | ICD-10-CM | POA: Diagnosis not present

## 2018-06-08 DIAGNOSIS — J449 Chronic obstructive pulmonary disease, unspecified: Secondary | ICD-10-CM | POA: Diagnosis not present

## 2018-06-08 DIAGNOSIS — J324 Chronic pansinusitis: Secondary | ICD-10-CM | POA: Diagnosis not present

## 2018-06-08 DIAGNOSIS — Z809 Family history of malignant neoplasm, unspecified: Secondary | ICD-10-CM | POA: Diagnosis not present

## 2018-06-08 DIAGNOSIS — R001 Bradycardia, unspecified: Secondary | ICD-10-CM | POA: Diagnosis not present

## 2018-06-08 DIAGNOSIS — M1990 Unspecified osteoarthritis, unspecified site: Secondary | ICD-10-CM | POA: Diagnosis not present

## 2018-06-08 DIAGNOSIS — R0600 Dyspnea, unspecified: Secondary | ICD-10-CM | POA: Diagnosis not present

## 2018-06-08 DIAGNOSIS — Z86010 Personal history of colonic polyps: Secondary | ICD-10-CM | POA: Diagnosis not present

## 2018-06-08 DIAGNOSIS — Z7951 Long term (current) use of inhaled steroids: Secondary | ICD-10-CM | POA: Diagnosis not present

## 2018-06-08 DIAGNOSIS — I10 Essential (primary) hypertension: Secondary | ICD-10-CM | POA: Diagnosis not present

## 2018-06-08 MED ORDER — ACETAMINOPHEN 325 MG PO TABS
650.0000 mg | ORAL_TABLET | Freq: Four times a day (QID) | ORAL | Status: DC | PRN
  Administered 2018-06-08: 650 mg via ORAL
  Filled 2018-06-08: qty 2

## 2018-06-08 MED ORDER — CEPHALEXIN 500 MG PO CAPS: 500.0000 mg | ORAL_CAPSULE | Freq: Three times a day (TID) | ORAL | 0 refills | Status: DC

## 2018-06-08 NOTE — Progress Notes (Signed)
Patient discharged to home. Verbalizes understanding of all discharge instructions including discharge medications and follow up MD visits. Patient accompanied by family. 

## 2018-06-08 NOTE — Plan of Care (Signed)
  Problem: Education: Goal: Knowledge of General Education information will improve Description: Including pain rating scale, medication(s)/side effects and non-pharmacologic comfort measures Outcome: Progressing   Problem: Health Behavior/Discharge Planning: Goal: Ability to manage health-related needs will improve Outcome: Progressing   Problem: Nutrition: Goal: Adequate nutrition will be maintained Outcome: Progressing   Problem: Elimination: Goal: Will not experience complications related to urinary retention Outcome: Progressing   

## 2018-06-08 NOTE — Discharge Summary (Signed)
Physician Discharge Summary  Patient ID: [name] [name] MRN: [id number] DOB/AGE: 1951/03/05 67 y.o.  Admit date: 06/07/2018 Discharge date: 06/08/2018  Admission Diagnoses:chronic sinusitis  Discharge Diagnoses: same Active Problems:   Chronic sinusitis   Discharged Condition: good  Hospital Course: patient was admitted to the hospital after endoscopic sinus surgery with fusion because he has a tracheotomy and had some bleeding. That was just at the changeover which stopped spontaneously and quickly during the case. He's done well through the night. No problems or complaints with the tracheotomy and breathing well. Voice is normal. No bleeding from his nose. He has a slight headache which is typical. He has no vision changes. He is not requiring any narcotic pain medicine through the night.  Consults: None  Significant Diagnostic Studies: 0  Treatments: surgery: ESS with Fusion  Discharge Exam: Blood pressure 128/76, pulse 76, temperature 98 F (36.7 C), resp. rate 17, height 5\' 5"  (1.651 m), weight 64.1 kg, SpO2 97 %. awake alert. He is very happy already with his symptoms. no bleeding. no swelling. vision intact. trach without bleeding and good airway. Valve working well and voice normal. cv regular. l- clear/ abd- soft ext no swelling or tenderness  Disposition:  He was observed overnight because he has a tracheotomy and he did have some minimal bleeding during the change in the beginning of the case. He's done expect excellent through the night. No bleeding. Voice is normal. His Passy-Muir valve is working well. He is very happy with the results already with much improvement feeling in his nose. He is discharged to home on Keflex. He doesn't seem to need any pain medicine as he's not had any need through the night. He'll take Motrin and Tylenol. He will follow-up in one week.     Signed: Melissa Montane 06/08/2018, 7:41 AM

## 2018-06-10 DIAGNOSIS — J329 Chronic sinusitis, unspecified: Secondary | ICD-10-CM | POA: Diagnosis not present

## 2018-06-15 DIAGNOSIS — J324 Chronic pansinusitis: Secondary | ICD-10-CM | POA: Diagnosis not present

## 2018-06-22 DIAGNOSIS — J324 Chronic pansinusitis: Secondary | ICD-10-CM | POA: Diagnosis not present

## 2018-06-23 DIAGNOSIS — J9601 Acute respiratory failure with hypoxia: Secondary | ICD-10-CM | POA: Diagnosis not present

## 2018-06-23 DIAGNOSIS — J441 Chronic obstructive pulmonary disease with (acute) exacerbation: Secondary | ICD-10-CM | POA: Diagnosis not present

## 2018-06-23 DIAGNOSIS — I10 Essential (primary) hypertension: Secondary | ICD-10-CM | POA: Diagnosis not present

## 2018-06-28 ENCOUNTER — Emergency Department (HOSPITAL_COMMUNITY): Payer: Medicare Other

## 2018-06-28 ENCOUNTER — Encounter (HOSPITAL_COMMUNITY): Payer: Self-pay | Admitting: *Deleted

## 2018-06-28 ENCOUNTER — Observation Stay (HOSPITAL_COMMUNITY)
Admission: EM | Admit: 2018-06-28 | Discharge: 2018-06-29 | Disposition: A | Discharge status: Home / Self Care | Payer: Medicare Other | Attending: Internal Medicine | Admitting: Internal Medicine

## 2018-06-28 ENCOUNTER — Other Ambulatory Visit: Payer: Self-pay

## 2018-06-28 DIAGNOSIS — R0602 Shortness of breath: Secondary | ICD-10-CM | POA: Diagnosis not present

## 2018-06-28 DIAGNOSIS — Z79899 Other long term (current) drug therapy: Secondary | ICD-10-CM | POA: Insufficient documentation

## 2018-06-28 DIAGNOSIS — R0902 Hypoxemia: Secondary | ICD-10-CM | POA: Diagnosis not present

## 2018-06-28 DIAGNOSIS — Z930 Tracheostomy status: Secondary | ICD-10-CM | POA: Diagnosis not present

## 2018-06-28 DIAGNOSIS — J4489 Other specified chronic obstructive pulmonary disease: Secondary | ICD-10-CM | POA: Diagnosis present

## 2018-06-28 DIAGNOSIS — J9601 Acute respiratory failure with hypoxia: Secondary | ICD-10-CM | POA: Diagnosis present

## 2018-06-28 DIAGNOSIS — J441 Chronic obstructive pulmonary disease with (acute) exacerbation: Secondary | ICD-10-CM | POA: Diagnosis not present

## 2018-06-28 DIAGNOSIS — M1990 Unspecified osteoarthritis, unspecified site: Secondary | ICD-10-CM | POA: Diagnosis not present

## 2018-06-28 DIAGNOSIS — N400 Enlarged prostate without lower urinary tract symptoms: Secondary | ICD-10-CM | POA: Insufficient documentation

## 2018-06-28 DIAGNOSIS — I10 Essential (primary) hypertension: Secondary | ICD-10-CM | POA: Diagnosis not present

## 2018-06-28 DIAGNOSIS — R000 Tachycardia, unspecified: Secondary | ICD-10-CM | POA: Insufficient documentation

## 2018-06-28 DIAGNOSIS — F419 Anxiety disorder, unspecified: Secondary | ICD-10-CM | POA: Diagnosis not present

## 2018-06-28 DIAGNOSIS — J449 Chronic obstructive pulmonary disease, unspecified: Secondary | ICD-10-CM | POA: Diagnosis present

## 2018-06-28 MED ORDER — SODIUM CHLORIDE 0.9 % IV BOLUS
500.0000 mL | Freq: Once | INTRAVENOUS | Status: AC
  Administered 2018-06-28: 500 mL via INTRAVENOUS

## 2018-06-28 MED ORDER — MAGNESIUM SULFATE IN D5W 1-5 GM/100ML-% IV SOLN
1.0000 g | Freq: Once | INTRAVENOUS | Status: AC
  Administered 2018-06-28: 1 g via INTRAVENOUS
  Filled 2018-06-28: qty 100

## 2018-06-28 MED ORDER — METHYLPREDNISOLONE SODIUM SUCC 125 MG IJ SOLR
125.0000 mg | Freq: Once | INTRAMUSCULAR | Status: AC
  Administered 2018-06-28: 125 mg via INTRAVENOUS
  Filled 2018-06-28: qty 2

## 2018-06-28 MED ORDER — ALBUTEROL SULFATE (2.5 MG/3ML) 0.083% IN NEBU
5.0000 mg | INHALATION_SOLUTION | Freq: Once | RESPIRATORY_TRACT | Status: AC
  Administered 2018-06-28: 5 mg via RESPIRATORY_TRACT
  Filled 2018-06-28: qty 6

## 2018-06-28 MED ORDER — MAGNESIUM SULFATE 50 % IJ SOLN: 1.0000 g | Freq: Once | INTRAMUSCULAR | Status: DC

## 2018-06-28 NOTE — ED Triage Notes (Signed)
Per daughter, pt has been having difficulty breathing x 2 days.  Pt has a trach that has green mucus.

## 2018-06-28 NOTE — ED Notes (Signed)
Called Resp. @2159 
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
## 2018-06-29 DIAGNOSIS — J441 Chronic obstructive pulmonary disease with (acute) exacerbation: Secondary | ICD-10-CM

## 2018-06-29 DIAGNOSIS — I10 Essential (primary) hypertension: Secondary | ICD-10-CM

## 2018-06-29 DIAGNOSIS — J9601 Acute respiratory failure with hypoxia: Secondary | ICD-10-CM | POA: Diagnosis not present

## 2018-06-29 DIAGNOSIS — R0902 Hypoxemia: Secondary | ICD-10-CM | POA: Diagnosis not present

## 2018-06-29 LAB — CBC WITH DIFFERENTIAL/PLATELET
Abs Immature Granulocytes: 0.02 10*3/uL (ref 0.00–0.07)
BASOS ABS: 0.1 10*3/uL (ref 0.0–0.1)
Basophils Relative: 1 %
EOS ABS: 0.5 10*3/uL (ref 0.0–0.5)
EOS PCT: 5 %
HEMATOCRIT: 40.7 % (ref 39.0–52.0)
Hemoglobin: 13.3 g/dL (ref 13.0–17.0)
Immature Granulocytes: 0 %
LYMPHS ABS: 1.8 10*3/uL (ref 0.7–4.0)
LYMPHS PCT: 20 %
MCH: 30.6 pg (ref 26.0–34.0)
MCHC: 32.7 g/dL (ref 30.0–36.0)
MCV: 93.6 fL (ref 80.0–100.0)
MONO ABS: 0.9 10*3/uL (ref 0.1–1.0)
MONOS PCT: 10 %
NRBC: 0 % (ref 0.0–0.2)
Neutro Abs: 5.7 10*3/uL (ref 1.7–7.7)
Neutrophils Relative %: 64 %
Platelets: 420 10*3/uL — ABNORMAL HIGH (ref 150–400)
RBC: 4.35 MIL/uL (ref 4.22–5.81)
RDW: 13.6 % (ref 11.5–15.5)
WBC: 8.8 10*3/uL (ref 4.0–10.5)

## 2018-06-29 LAB — HIV ANTIBODY (ROUTINE TESTING W REFLEX): HIV SCREEN 4TH GENERATION: NONREACTIVE

## 2018-06-29 LAB — COMPREHENSIVE METABOLIC PANEL
ALBUMIN: 3.5 g/dL (ref 3.5–5.0)
ALT: 24 U/L (ref 0–44)
ANION GAP: 9 (ref 5–15)
AST: 28 U/L (ref 15–41)
Alkaline Phosphatase: 88 U/L (ref 38–126)
BILIRUBIN TOTAL: 0.3 mg/dL (ref 0.3–1.2)
BUN: 18 mg/dL (ref 8–23)
CALCIUM: 9.4 mg/dL (ref 8.9–10.3)
CO2: 25 mmol/L (ref 22–32)
Chloride: 106 mmol/L (ref 98–111)
Creatinine, Ser: 0.78 mg/dL (ref 0.61–1.24)
GFR calc Af Amer: >60 mL/min (ref >60)
GFR calc non Af Amer: >60 mL/min (ref >60)
GLUCOSE: 172 mg/dL — AB (ref 70–99)
Potassium: 3.5 mmol/L (ref 3.5–5.1)
Sodium: 140 mmol/L (ref 135–145)
TOTAL PROTEIN: 8.1 g/dL (ref 6.5–8.1)

## 2018-06-29 LAB — TROPONIN I: Troponin I: <0.03 ng/mL (ref <0.03)

## 2018-06-29 MED ORDER — IPRATROPIUM BROMIDE 0.02 % IN SOLN
0.5000 mg | Freq: Once | RESPIRATORY_TRACT | Status: AC
  Administered 2018-06-29: 0.5 mg via RESPIRATORY_TRACT
  Filled 2018-06-29: qty 2.5

## 2018-06-29 MED ORDER — ALBUTEROL SULFATE (2.5 MG/3ML) 0.083% IN NEBU: 2.5000 mg | INHALATION_SOLUTION | RESPIRATORY_TRACT | Status: DC | PRN

## 2018-06-29 MED ORDER — TERAZOSIN HCL 5 MG PO CAPS: 5.0000 mg | ORAL_CAPSULE | Freq: Every day | ORAL | Status: DC

## 2018-06-29 MED ORDER — IPRATROPIUM BROMIDE 0.02 % IN SOLN: 0.5000 mg | Freq: Once | RESPIRATORY_TRACT | Status: DC

## 2018-06-29 MED ORDER — ENOXAPARIN SODIUM 40 MG/0.4ML ~~LOC~~ SOLN: 40.0000 mg | SUBCUTANEOUS | Status: DC

## 2018-06-29 MED ORDER — DOXYCYCLINE HYCLATE 100 MG PO TABS: 100.0000 mg | ORAL_TABLET | Freq: Two times a day (BID) | ORAL | 0 refills | Status: AC

## 2018-06-29 MED ORDER — UMECLIDINIUM-VILANTEROL 62.5-25 MCG/INH IN AEPB
1.0000 | INHALATION_SPRAY | Freq: Every day | RESPIRATORY_TRACT | Status: DC
  Administered 2018-06-29: 1 via RESPIRATORY_TRACT
  Filled 2018-06-29: qty 14

## 2018-06-29 MED ORDER — BENZONATATE 100 MG PO CAPS
200.0000 mg | ORAL_CAPSULE | Freq: Two times a day (BID) | ORAL | Status: DC | PRN
  Administered 2018-06-29: 200 mg via ORAL
  Filled 2018-06-29: qty 2

## 2018-06-29 MED ORDER — ENOXAPARIN SODIUM 40 MG/0.4ML ~~LOC~~ SOLN
40.0000 mg | Freq: Every day | SUBCUTANEOUS | Status: DC
  Administered 2018-06-29: 40 mg via SUBCUTANEOUS
  Filled 2018-06-29: qty 0.4

## 2018-06-29 MED ORDER — BISACODYL 5 MG PO TBEC: 10.0000 mg | DELAYED_RELEASE_TABLET | Freq: Every day | ORAL | Status: DC | PRN

## 2018-06-29 MED ORDER — LOSARTAN POTASSIUM 25 MG PO TABS
25.0000 mg | ORAL_TABLET | Freq: Every day | ORAL | Status: DC
  Administered 2018-06-29: 25 mg via ORAL
  Filled 2018-06-29: qty 1

## 2018-06-29 MED ORDER — PREDNISONE 20 MG PO TABS
40.0000 mg | ORAL_TABLET | Freq: Every day | ORAL | Status: DC
  Administered 2018-06-29: 40 mg via ORAL
  Filled 2018-06-29: qty 2

## 2018-06-29 MED ORDER — DOXYCYCLINE HYCLATE 100 MG PO TABS
100.0000 mg | ORAL_TABLET | Freq: Two times a day (BID) | ORAL | Status: DC
  Administered 2018-06-29: 100 mg via ORAL
  Filled 2018-06-29: qty 1

## 2018-06-29 MED ORDER — LORATADINE 10 MG PO TABS
10.0000 mg | ORAL_TABLET | Freq: Every day | ORAL | Status: DC
  Administered 2018-06-29: 10 mg via ORAL
  Filled 2018-06-29: qty 1

## 2018-06-29 MED ORDER — FAMOTIDINE 20 MG PO TABS
20.0000 mg | ORAL_TABLET | Freq: Every day | ORAL | Status: DC
  Administered 2018-06-29: 20 mg via ORAL
  Filled 2018-06-29: qty 1

## 2018-06-29 MED ORDER — FINASTERIDE 5 MG PO TABS
5.0000 mg | ORAL_TABLET | Freq: Every day | ORAL | Status: DC
  Administered 2018-06-29: 5 mg via ORAL
  Filled 2018-06-29: qty 1

## 2018-06-29 MED ORDER — ALBUTEROL SULFATE (2.5 MG/3ML) 0.083% IN NEBU: 5.0000 mg | INHALATION_SOLUTION | Freq: Once | RESPIRATORY_TRACT | Status: DC

## 2018-06-29 MED ORDER — PANTOPRAZOLE SODIUM 40 MG PO TBEC
40.0000 mg | DELAYED_RELEASE_TABLET | Freq: Every day | ORAL | Status: DC
  Administered 2018-06-29: 40 mg via ORAL
  Filled 2018-06-29: qty 1

## 2018-06-29 MED ORDER — ALBUTEROL (5 MG/ML) CONTINUOUS INHALATION SOLN
10.0000 mg/h | INHALATION_SOLUTION | Freq: Once | RESPIRATORY_TRACT | Status: AC
  Administered 2018-06-29: 10 mg/h via RESPIRATORY_TRACT
  Filled 2018-06-29: qty 20

## 2018-06-29 MED ORDER — HYDROCHLOROTHIAZIDE 25 MG PO TABS
25.0000 mg | ORAL_TABLET | Freq: Every day | ORAL | Status: DC
  Administered 2018-06-29: 25 mg via ORAL
  Filled 2018-06-29: qty 1

## 2018-06-29 MED ORDER — PREDNISONE 20 MG PO TABS: 40.0000 mg | ORAL_TABLET | Freq: Every day | ORAL | 0 refills | Status: AC

## 2018-06-29 NOTE — Care Management Obs Status (Signed)
[facility] NOTIFICATION   Patient Details  Name: [name] [name] MRN: [id number] Date of Birth: [birth year]   Medicare Observation Status Notification Given:  Yes    Leeroy Cha, RN 06/29/2018, 1:00 PM

## 2018-06-29 NOTE — H&P (Signed)
History and Physical    [name] [name] YCX:[id number] DOB: 25-Oct-1950 DOA: 06/28/2018  PCP: [name], MD (Inactive)  Patient coming from: Home  I have personally briefly reviewed patient's old medical records in [place]  Chief Complaint: SOB  HPI: [name] [name] is a 67 y.o. 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  Patient presents to the ED with cough, SOB, wheezing.  Symptoms ongoing for the past 2 days.  Not really helped by albuterol inhaler and nebulizer.  Symptoms worsening.  No CP, fever.  [name] [name] was actually ready to be removed, but was left in while they did a sinus surgery 9/24.  His last admission for COPD exacerbation was about 1 month ago on 9/13.   ED Course: Patient given solumedrol, neb treatments.  Noted to be satting 86% on RA, improved with O2 via trach mask.  Normally not requiring O2 at baseline.  Cough improved in ED but wheezing persists.  Hospitalist asked to admit.   Review of Systems: As per HPI otherwise 10 point review of systems negative.   Past Medical History:  Diagnosis Date  . Abnormal EKG 12/24/11   anteroseptal and lateral ST elevation, felt r/t early repolarization;  Cardiac cath 12/24/11 - normal coronary anatomy, EF 55-65%  . Anxiety   . Arthritis   . Asthma   . Bradycardia, sinus 12/24/11  . COPD (chronic obstructive pulmonary disease) ([place])   . Dyspnea   . H/O tracheostomy   . History of alcohol abuse    hospitalized for detox 2002  . HTN (hypertension)   . Hypotension 12/24/11   in the setting of dehydration   . Marijuana use   . Pneumonia   . Syncope and collapse 12/24/11   2/2 hypotension in the setting of dehydration  . Upper airway cough syndrome     Past Surgical History:  Procedure Laterality Date  . CARDIAC CATHETERIZATION  12/24/11   normal coronary anatomy, EF 55-65%  . CIRCUMCISION  1972  . COLONOSCOPY WITH PROPOFOL N/A 02/24/2018   Procedure: COLONOSCOPY WITH  PROPOFOL;  Surgeon: Otis Brace, MD;  [facility] ENDOSCOPY;  Service: Gastroenterology;  Laterality: N/A;  . ESOPHAGOGASTRODUODENOSCOPY N/A 10/09/2015   Procedure: ESOPHAGOGASTRODUODENOSCOPY (EGD);  Surgeon: Judeth Horn, MD;  Location: [facility] ENDOSCOPY;  Service: General;  Laterality: N/A;  . IRRIGATION AND DEBRIDEMENT KNEE Right 05/23/2017   Procedure: IRRIGATION AND DEBRIDEMENT KNEE;  Surgeon: Wylene Simmer, MD;  Location: [facility] ORS;  Service: Orthopedics;  Laterality: Right;  . LACERATION REPAIR  02/2004   arthroscopic debridement of triagular fibrocartilage tear/E-chart; right wrist  . LEFT HEART CATHETERIZATION WITH CORONARY ANGIOGRAM N/A 12/24/2011   Procedure: LEFT HEART CATHETERIZATION WITH CORONARY ANGIOGRAM;  Surgeon: Peter M Martinique, MD;  Location: [facility] CATH LAB;  Service: Cardiovascular;  Laterality: N/A;  . PEG PLACEMENT N/A 10/09/2015   Procedure: PERCUTANEOUS ENDOSCOPIC GASTROSTOMY (PEG) PLACEMENT;  Surgeon: Judeth Horn, MD;  Location: [place];  Service: General;  Laterality: N/A;  . PEG TUBE REMOVAL    . PERCUTANEOUS TRACHEOSTOMY N/A 10/09/2015   Procedure: PERCUTANEOUS TRACHEOSTOMY;  Surgeon: Judeth Horn, MD;  Location: [place];  Service: General;  Laterality: N/A;  . POLYPECTOMY  02/24/2018   Procedure: POLYPECTOMY;  Surgeon: Otis Brace, MD;  [facility] ENDOSCOPY;  Service: Gastroenterology;;  . SINUS ENDO WITH FUSION  06/07/2018  . SINUS ENDO WITH FUSION Bilateral 06/07/2018   Procedure: SINUS ENDO WITH FUSION;  Surgeon: Melissa Montane, MD;  Location: [place];  Service: ENT;  Laterality: Bilateral;  with fuision     reports that he has never smoked. He has never used smokeless tobacco. He reports that he drinks alcohol. He reports that he does not use drugs.  No Known Allergies  Family History  Problem Relation Age of Onset  . COPD Sister   . Cancer Sister   . Asthma Other      Prior to Admission medications   Medication Sig Start Date End Date Taking? Authorizing  Provider  albuterol (PROVENTIL HFA;VENTOLIN HFA) 108 (90 Base) MCG/ACT inhaler Inhale 2 puffs into the lungs every 6 (six) hours as needed for wheezing or shortness of breath. 01/11/18  Yes Thurnell Lose, MD  albuterol (PROVENTIL) (2.5 MG/3ML) 0.083% nebulizer solution Take 3-6 mLs (2.5-5 mg total) by nebulization every 4 (four) hours as needed for wheezing or shortness of breath. 03/12/15  Yes Molpus, John, MD  BISACODYL 5 MG EC tablet Take 10 mg by mouth daily as needed for moderate constipation.  02/21/18  Yes [provider]  cetirizine (ZYRTEC) 10 MG tablet Take 10 mg by mouth daily as needed for allergies.  09/11/16  Yes [provider]  famotidine (PEPCID) 20 MG tablet Take 1 tablet (20 mg total) by mouth at bedtime. One at bedtime Patient taking differently: Take 20 mg by mouth daily.  01/14/18  Yes Tanda Rockers, MD  finasteride (PROSCAR) 5 MG tablet Take 5 mg by mouth daily.    Yes [provider]  fluticasone (FLONASE) 50 MCG/ACT nasal spray Place 2 sprays into both nostrils daily as needed for allergies.    Yes [provider]  Glycopyrrolate-Formoterol (BEVESPI AEROSPHERE) 9-4.8 MCG/ACT AERO Inhale 2 puffs into the lungs 2 (two) times daily. 01/14/18  Yes Tanda Rockers, MD  hydrochlorothiazide (HYDRODIURIL) 25 MG tablet Take 1 tablet (25 mg total) by mouth daily. 05/30/18  Yes Lavina Hamman, MD  losartan (COZAAR) 25 MG tablet Take 1 tablet by mouth daily. 03/29/18  Yes [provider]  Multiple Vitamin (MULTIVITAMIN) tablet Take 1 tablet by mouth daily.   Yes [provider]  Naphazoline HCl (CLEAR EYES OP) Place 1 drop into both eyes daily as needed (dry eyes).    Yes [provider]  pantoprazole (PROTONIX) 40 MG tablet Take 1 tablet (40 mg total) by mouth daily. Take 30-60 min before first meal of the day 12/14/17  Yes Tanda Rockers, MD  terazosin (HYTRIN) 5 MG capsule Take 5 mg by mouth at bedtime.   Yes [provider]  benzonatate (TESSALON) 200 MG capsule Take 1 capsule (200 mg total) by mouth 2 (two) times daily as needed for cough. Patient not taking: Reported on 06/29/2018 05/30/18   Lavina Hamman, MD    Physical Exam: Vitals:   06/29/18 0030 06/29/18 0100 06/29/18 0130 06/29/18 0224  BP: 137/88 123/83 133/83   Pulse: (!) 107 (!) 103 98 (!) 102  Resp:      Temp:      TempSrc:      SpO2: 98% 99% 98% 97%  Weight:      Height:        Constitutional: NAD, calm, comfortable Eyes: PERRL, lids and conjunctivae normal ENMT: Mucous membranes are moist. Posterior pharynx clear of any exudate or lesions.Normal dentition.  Neck: normal, supple, no masses, no thyromegaly Respiratory: Diffuse wheezing Cardiovascular: Regular rate and rhythm, no murmurs / rubs / gallops. No extremity edema. 2+ pedal pulses. No carotid bruits.  Abdomen: no tenderness, no  masses palpated. No hepatosplenomegaly. Bowel sounds positive.  Musculoskeletal: no clubbing / cyanosis. No joint deformity upper and lower extremities. Good ROM, no contractures. Normal muscle tone.  Skin: no rashes, lesions, ulcers. No induration Neurologic: CN 2-12 grossly intact. Sensation intact, DTR normal. Strength 5/5 in all 4.  Psychiatric: Normal judgment and insight. Alert and oriented x 3. Normal mood.    Labs on Admission: I have personally reviewed following labs and imaging studies  CBC: Recent Labs  Lab 06/28/18 2321  WBC 8.8  NEUTROABS 5.7  HGB 13.3  HCT 40.7  MCV 93.6  PLT 884*   Basic Metabolic Panel: Recent Labs  Lab 06/28/18 2321  NA 140  K 3.5  CL 106  CO2 25  GLUCOSE 172*  BUN 18  CREATININE 0.78  CALCIUM 9.4   GFR: Estimated Creatinine Clearance: 77.9 mL/min (by C-G formula based on SCr of 0.78 mg/dL). Liver Function Tests: Recent Labs  Lab 06/28/18 2321  AST 28  ALT 24  ALKPHOS 88  BILITOT 0.3  PROT 8.1  ALBUMIN 3.5   No results for input(s): LIPASE, AMYLASE in the last 168  hours. No results for input(s): AMMONIA in the last 168 hours. Coagulation Profile: No results for input(s): INR, PROTIME in the last 168 hours. Cardiac Enzymes: Recent Labs  Lab 06/28/18 2321  TROPONINI <0.03   BNP (last 3 results) No results for input(s): PROBNP in the last 8760 hours. HbA1C: No results for input(s): HGBA1C in the last 72 hours. CBG: No results for input(s): GLUCAP in the last 168 hours. Lipid Profile: No results for input(s): CHOL, HDL, LDLCALC, TRIG, CHOLHDL, LDLDIRECT in the last 72 hours. Thyroid Function Tests: No results for input(s): TSH, T4TOTAL, FREET4, T3FREE, THYROIDAB in the last 72 hours. Anemia Panel: No results for input(s): VITAMINB12, FOLATE, FERRITIN, TIBC, IRON, RETICCTPCT in the last 72 hours. Urine analysis:    Component Value Date/Time   COLORURINE STRAW (A) 09/26/2016 1620   APPEARANCEUR CLEAR 09/26/2016 1620   LABSPEC 1.006 09/26/2016 1620   PHURINE 6.0 09/26/2016 1620   GLUCOSEU NEGATIVE 09/26/2016 1620   HGBUR SMALL (A) 09/26/2016 1620   BILIRUBINUR NEGATIVE 09/26/2016 1620   KETONESUR NEGATIVE 09/26/2016 1620   PROTEINUR NEGATIVE 09/26/2016 1620   UROBILINOGEN 0.2 09/30/2013 0224   NITRITE NEGATIVE 09/26/2016 1620   LEUKOCYTESUR NEGATIVE 09/26/2016 1620    Radiological Exams on Admission: Dg Chest Portable 1 View  Result Date: 06/29/2018 CLINICAL DATA:  Difficulty breathing for 2 days. Shortness of breath. History of COPD, hypertension, pneumonia. EXAM: PORTABLE CHEST 1 VIEW COMPARISON:  05/27/2018 FINDINGS: Normal heart size and pulmonary vascularity. Lungs are clear and expanded. No airspace disease or consolidation. No blunting of costophrenic angles. No pneumothorax. Endotracheal tube is present. Old left rib fractures. IMPRESSION: No active disease. Electronically Signed   By: Lucienne Capers M.D.   On: 06/29/2018 00:17    EKG: Independently reviewed.  Assessment/Plan Principal Problem:   COPD exacerbation  (HCC) Active Problems:   Essential hypertension   Acute respiratory failure with hypoxia ([place])    1. COPD exacerbation with new O2 requirement - 1. COPD pathway 2. Prednisone 3. Scheduled and PRN nebs 4. O2 via trach mask 5. Doxycycline 2. HTN - continue home BP meds  DVT prophylaxis: Lovenox Code Status: Full Family Communication: No family in room Disposition Plan: Home after admit Consults called: None Admission status: Admit to inpatient  Severity of Illness: The appropriate patient status for this patient is INPATIENT. Inpatient status is judged to  be reasonable and necessary in order to provide the required intensity of service to ensure the patient's safety. The patient's presenting symptoms, physical exam findings, and initial radiographic and laboratory data in the context of their chronic comorbidities is felt to place them at high risk for further clinical deterioration. Furthermore, it is not anticipated that the patient will be medically stable for discharge from the hospital within 2 midnights of admission. The following factors support the patient status of inpatient.   " The patient's presenting symptoms include wheezing, sob, cough. " The worrisome physical exam findings include Wheezing. " The initial radiographic and laboratory data are worrisome because of New O2 requirement not present at baseline. " The chronic co-morbidities include COPD, presence of tracheostomy.   * I certify that at the point of admission it is my clinical judgment that the patient will require inpatient hospital care spanning beyond 2 midnights from the point of admission due to high intensity of service, high risk for further deterioration and high frequency of surveillance required.Etta Quill DO [facility] Pager [phone] Only works nights!  If 7AM-7PM, please contact the primary day team physician taking care of patient  [email or web address] Password  [email or web address]  06/29/2018, 3:50 AM

## 2018-06-29 NOTE — ED Notes (Signed)
ED TO INPATIENT HANDOFF REPORT  Name/Age/Gender [name] 67 y.o. male  Code Status    Code Status Orders  (From admission, onward)         Start     Ordered   06/29/18 0312  Full code  Continuous     06/29/18 0316        Code Status History    Date Active Date Inactive Code Status Order ID Comments User Context   06/07/2018 0959 06/08/2018 1446 Full Code [id number]  Melissa Montane, MD Inpatient   05/27/2018 2221 05/30/2018 1314 Full Code [id number]  Rise Patience, MD ED   05/05/2018 0548 05/06/2018 1753 Full Code [id number]  Reyne Dumas, MD ED   03/06/2018 1038 03/08/2018 1706 Full Code [id number]  Dessa Phi, DO Inpatient   01/10/2018 1745 01/11/2018 1943 Full Code [id number]  Karmen Bongo, MD ED   05/23/2017 0414 05/24/2017 2003 Full Code [id number]  Wylene Simmer, MD Inpatient   10/19/2016 0704 10/20/2016 1443 Full Code [id number]  Edwin Dada, MD Inpatient   05/09/2016 0152 05/10/2016 1411 Full Code [id number]  Toy Baker, MD Inpatient   01/02/2016 1105 01/05/2016 1937 Full Code [id number]  Sid Falcon, MD ED   12/19/2015 2348 12/22/2015 1614 Full Code [id number]  Ivor Costa, MD ED   09/18/2015 2037 11/07/2015 1800 Full Code [id number]  Judeth Horn, MD ED   05/11/2015 1224 05/13/2015 1946 Full Code [id number]  Francesca Oman, DO Inpatient   04/02/2015 0927 04/03/2015 1552 Full Code [id number]  Quintella Baton, MD Inpatient    Advance Directive Documentation     Most Recent Value  Type of Advance Directive  Healthcare Power of Attorney  Pre-existing out of facility DNR order (yellow form or pink MOST form)  -  "MOST" Form in Place?  -      Home/SNF/Other Home  Chief Complaint shortness of breath  Level of Care/Admitting Diagnosis ED Disposition    ED Disposition Condition [place]: [facility] [[id number]]  Level of Care: Med-Surg [16]  Diagnosis: COPD exacerbation [facility]) [832919]  Admitting Physician: Doreatha Massed  Attending Physician: Etta Quill [phone]  Estimated length of stay: past midnight tomorrow  Certification:: I certify this patient will need inpatient services for at least 2 midnights  PT Class (Do Not Modify): Inpatient [101]  PT Acc Code (Do Not Modify): Private [1]       Medical History Past Medical History:  Diagnosis Date  . Abnormal EKG 12/24/11   anteroseptal and lateral ST elevation, felt r/t early repolarization;  Cardiac cath 12/24/11 - normal coronary anatomy, EF 55-65%  . Anxiety   . Arthritis   . Asthma   . Bradycardia, sinus 12/24/11  . COPD (chronic obstructive pulmonary disease) ([place])   . Dyspnea   . H/O tracheostomy   . History of alcohol abuse    hospitalized for detox 2002  . HTN (hypertension)   . Hypotension 12/24/11   in the setting of dehydration   . Marijuana use   . Pneumonia   . Syncope and collapse 12/24/11   2/2 hypotension in the setting of dehydration  . Upper airway cough syndrome     Allergies No Known Allergies  IV Location/Drains/Wounds Patient Lines/Drains/Airways Status   Active Line/Drains/Airways    Name:   Placement date:   Placement time:   Site:   Days:   Peripheral IV 06/28/18 Left Forearm   06/28/18    2256  Forearm   1   Incision (Closed) 05/23/17 Knee Right   05/23/17    0236     402   Incision (Closed) 05/23/17 Arm Right   05/23/17    0253     402   Incision (Closed) 05/23/17 Hand Right   05/23/17    0253     402   Incision (Closed) 05/23/17 Knee Left   05/23/17    0253     402   Incision (Closed) 05/23/17 Foot Left   05/23/17    0253     402   Incision (Closed) 06/07/18 Nose   06/07/18    0826     22   Tracheostomy Shiley 6 mm Uncuffed   -    -    6 mm             Labs/Imaging Results for orders placed or performed during the hospital encounter of 06/28/18 (from the past 48 hour(s))  CBC with Differential     Status: Abnormal   Collection Time: 06/28/18 11:21 PM  Result Value Ref Range   WBC 8.8 4.0  - 10.5 K/uL   RBC 4.35 4.22 - 5.81 MIL/uL   Hemoglobin 13.3 13.0 - 17.0 g/dL   HCT 40.7 39.0 - 52.0 %   MCV 93.6 80.0 - 100.0 fL   MCH 30.6 26.0 - 34.0 pg   MCHC 32.7 30.0 - 36.0 g/dL   RDW 13.6 11.5 - 15.5 %   Platelets 420 (H) 150 - 400 K/uL   nRBC 0.0 0.0 - 0.2 %   Neutrophils Relative % 64 %   Neutro Abs 5.7 1.7 - 7.7 K/uL   Lymphocytes Relative 20 %   Lymphs Abs 1.8 0.7 - 4.0 K/uL   Monocytes Relative 10 %   Monocytes Absolute 0.9 0.1 - 1.0 K/uL   Eosinophils Relative 5 %   Eosinophils Absolute 0.5 0.0 - 0.5 K/uL   Basophils Relative 1 %   Basophils Absolute 0.1 0.0 - 0.1 K/uL   Immature Granulocytes 0 %   Abs Immature Granulocytes 0.02 0.00 - 0.07 K/uL    Comment: Performed at [facility], [place] [address], [place], [place] [address]  Comprehensive metabolic panel     Status: Abnormal   Collection Time: 06/28/18 11:21 PM  Result Value Ref Range   Sodium 140 135 - 145 mmol/L   Potassium 3.5 3.5 - 5.1 mmol/L   Chloride 106 98 - 111 mmol/L   CO2 25 22 - 32 mmol/L   Glucose, Bld 172 (H) 70 - 99 mg/dL   BUN 18 8 - 23 mg/dL   Creatinine, Ser 0.78 0.61 - 1.24 mg/dL   Calcium 9.4 8.9 - 10.3 mg/dL   Total Protein 8.1 6.5 - 8.1 g/dL   Albumin 3.5 3.5 - 5.0 g/dL   AST 28 15 - 41 U/L   ALT 24 0 - 44 U/L   Alkaline Phosphatase 88 38 - 126 U/L   Total Bilirubin 0.3 0.3 - 1.2 mg/dL   GFR calc non Af Amer >60 >60 mL/min   GFR calc Af Amer >60 >60 mL/min    Comment: (NOTE) The eGFR has been calculated using the CKD EPI equation. This calculation has not been validated in all clinical situations. eGFR's persistently <60 mL/min signify possible Chronic Kidney Disease.    Anion gap 9 5 - 15    Comment: Performed at [facility], [place] [address], [place], [place] [address]  Troponin  I     Status: None   Collection Time: 06/28/18 11:21 PM  Result Value Ref Range   Troponin I <0.03 <0.03 ng/mL    Comment: Performed at [facility], [place] [address], [place], [place] [address]   Dg Chest Portable 1 View  Result Date: 06/29/2018 CLINICAL DATA:  Difficulty breathing for 2 days. Shortness of breath. History of COPD, hypertension, pneumonia. EXAM: PORTABLE CHEST 1 VIEW COMPARISON:  05/27/2018 FINDINGS: Normal heart size and pulmonary vascularity. Lungs are clear and expanded. No airspace disease or consolidation. No blunting of costophrenic angles. No pneumothorax. Endotracheal tube is present. Old left rib fractures. IMPRESSION: No active disease. Electronically Signed   By: Lucienne Capers M.D.   On: 06/29/2018 00:17   EKG Interpretation  Date/Time:  Tuesday June 28 2018 23:15:32 EDT Ventricular Rate:  117 PR Interval:    QRS Duration: 82 QT Interval:  311 QTC Calculation: 434 R Axis:   80 Text Interpretation:  Sinus tachycardia Biatrial enlargement Anterior infarct, old No significant change since last tracing Confirmed by Ward, Cyril Mourning [phone]) on 06/28/2018 11:18:43 PM   Pending Labs Unresulted Labs (From admission, onward)    Start     Ordered   06/29/18 0309  HIV antibody  Once,   R     06/29/18 0316          Vitals/Pain Today's Vitals   06/29/18 0300 06/29/18 0319 06/29/18 0330 06/29/18 0400  BP: (!) 144/88  136/86 (!) 146/88  Pulse: 95  97 (!) 101  Resp:      Temp:      TempSrc:      SpO2: 97%  97% 95%  Weight:      Height:      PainSc:  0-No pain      Isolation Precautions No active isolations  Medications Medications  enoxaparin (LOVENOX) injection 40 mg (40 mg Subcutaneous Not Given 06/29/18 0430)  doxycycline (VIBRA-TABS) tablet 100 mg (100 mg Oral Not Given 06/29/18 0431)  predniSONE (DELTASONE) tablet 40 mg (has no administration in time range)  umeclidinium-vilanterol (ANORO ELLIPTA) 62.5-25 MCG/INH 1 puff (has no administration in time range)  albuterol (PROVENTIL) (2.5 MG/3ML) 0.083% nebulizer solution 2.5 mg (has no administration in time range)  benzonatate  (TESSALON) capsule 200 mg (has no administration in time range)  terazosin (HYTRIN) capsule 5 mg (has no administration in time range)  pantoprazole (PROTONIX) EC tablet 40 mg (has no administration in time range)  losartan (COZAAR) tablet 25 mg (has no administration in time range)  hydrochlorothiazide (HYDRODIURIL) tablet 25 mg (has no administration in time range)  finasteride (PROSCAR) tablet 5 mg (has no administration in time range)  bisacodyl (DULCOLAX) EC tablet 10 mg (has no administration in time range)  loratadine (CLARITIN) tablet 10 mg (has no administration in time range)  famotidine (PEPCID) tablet 20 mg (has no administration in time range)  albuterol (PROVENTIL) (2.5 MG/3ML) 0.083% nebulizer solution 5 mg (5 mg Nebulization Given 06/28/18 2217)  sodium chloride 0.9 % bolus 500 mL (0 mLs Intravenous Stopped 06/29/18 0010)  methylPREDNISolone sodium succinate (SOLU-MEDROL) 125 mg/2 mL injection 125 mg (125 mg Intravenous Given 06/28/18 2317)  magnesium sulfate IVPB 1 g 100 mL (0 g Intravenous Stopped 06/29/18 0028)  albuterol (PROVENTIL,VENTOLIN) solution continuous neb (10 mg/hr Nebulization Given 06/29/18 0013)  ipratropium (ATROVENT) nebulizer solution 0.5 mg (0.5 mg Nebulization Given 06/29/18 0013)    Mobility walks

## 2018-06-29 NOTE — Care Management CC44 (Signed)
Condition Code 44 Documentation Completed  Patient Details  Name: Leny Morozov MRN: [id number] Date of Birth: 22-Aug-1951   Condition Code 44 given:  Yes Patient signature on Condition Code 44 notice:  Yes Documentation of 2 MD's agreement:  Yes Code 44 added to claim:  Yes    Leeroy Cha, RN 06/29/2018, 12:32 PM
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
## 2018-06-29 NOTE — Care Management Note (Signed)
Case Management Note  Patient Details  Name: [name] [name] MRN: [id number] Date of Birth: 11/23/1950  Subjective/Objective:                  Discharge to home with self care  Action/Plan: No cm needs present at time of dc Expected Discharge Date:  06/29/18               Expected Discharge Plan:  Home/Self Care  In-House Referral:     Discharge planning Services  CM Consult  Post Acute Care Choice:    Choice offered to:     DME Arranged:    DME Agency:     HH Arranged:    [place] Agency:     Status of Service:  Completed, signed off  If discussed at [employer] of Stay Meetings, dates discussed:    Additional Comments:  Leeroy Cha, RN 06/29/2018, 10:38 AM

## 2018-06-29 NOTE — ED Notes (Signed)
ED TO INPATIENT HANDOFF REPORT  Name/Age/Gender [name] [name] 67 y.o. male  Code Status    Code Status Orders  (From admission, onward)         Start     Ordered   06/29/18 0312  Full code  Continuous     06/29/18 0316        Code Status History    Date Active Date Inactive Code Status Order ID Comments User Context   06/07/2018 0959 06/08/2018 1446 Full Code [id number]  Melissa Montane, MD Inpatient   05/27/2018 2221 05/30/2018 1314 Full Code [id number]  Rise Patience, MD ED   05/05/2018 0548 05/06/2018 1753 Full Code [id number]  Reyne Dumas, MD ED   03/06/2018 1038 03/08/2018 1706 Full Code [id number]  Dessa Phi, DO Inpatient   01/10/2018 1745 01/11/2018 1943 Full Code [id number]  Karmen Bongo, MD ED   05/23/2017 0414 05/24/2017 2003 Full Code [id number]  Wylene Simmer, MD Inpatient   10/19/2016 0704 10/20/2016 1443 Full Code [id number]  Edwin Dada, MD Inpatient   05/09/2016 0152 05/10/2016 1411 Full Code [id number]  Toy Baker, MD Inpatient   01/02/2016 1105 01/05/2016 1937 Full Code [id number]  Sid Falcon, MD ED   12/19/2015 2348 12/22/2015 1614 Full Code [id number]  Ivor Costa, MD ED   09/18/2015 2037 11/07/2015 1800 Full Code [id number]  Judeth Horn, MD ED   05/11/2015 1224 05/13/2015 1946 Full Code [id number]  Francesca Oman, DO Inpatient   04/02/2015 0927 04/03/2015 1552 Full Code [id number]  Quintella Baton, MD Inpatient    Advance Directive Documentation     Most Recent Value  Type of Advance Directive  Healthcare Power of Attorney  Pre-existing out of facility DNR order (yellow form or pink MOST form)  -  "MOST" Form in Place?  -      Home/SNF/Other Home  Chief Complaint shortness of breath  Level of Care/Admitting Diagnosis ED Disposition    ED Disposition Condition [place]: [facility] [[id number]]  Level of Care: Med-Surg [16]  Diagnosis: COPD exacerbation [facility]) [832919]  Admitting Physician: Doreatha Massed  Attending Physician: Etta Quill [phone]  Estimated length of stay: past midnight tomorrow  Certification:: I certify this patient will need inpatient services for at least 2 midnights  PT Class (Do Not Modify): Inpatient [101]  PT Acc Code (Do Not Modify): Private [1]       Medical History Past Medical History:  Diagnosis Date  . Abnormal EKG 12/24/11   anteroseptal and lateral ST elevation, felt r/t early repolarization;  Cardiac cath 12/24/11 - normal coronary anatomy, EF 55-65%  . Anxiety   . Arthritis   . Asthma   . Bradycardia, sinus 12/24/11  . COPD (chronic obstructive pulmonary disease) ([place])   . Dyspnea   . H/O tracheostomy   . History of alcohol abuse    hospitalized for detox 2002  . HTN (hypertension)   . Hypotension 12/24/11   in the setting of dehydration   . Marijuana use   . Pneumonia   . Syncope and collapse 12/24/11   2/2 hypotension in the setting of dehydration  . Upper airway cough syndrome     Allergies No Known Allergies  IV Location/Drains/Wounds Patient Lines/Drains/Airways Status   Active Line/Drains/Airways    Name:   Placement date:   Placement time:   Site:   Days:   Peripheral IV 06/28/18 Left Forearm   06/28/18    2256  Forearm   1   Incision (Closed) 05/23/17 Knee Right   05/23/17    0236     402   Incision (Closed) 05/23/17 Arm Right   05/23/17    0253     402   Incision (Closed) 05/23/17 Hand Right   05/23/17    0253     402   Incision (Closed) 05/23/17 Knee Left   05/23/17    0253     402   Incision (Closed) 05/23/17 Foot Left   05/23/17    0253     402   Incision (Closed) 06/07/18 Nose   06/07/18    0826     22   Tracheostomy Shiley 6 mm Uncuffed   -    -    6 mm             Labs/Imaging Results for orders placed or performed during the hospital encounter of 06/28/18 (from the past 48 hour(s))  CBC with Differential     Status: Abnormal   Collection Time: 06/28/18 11:21 PM  Result Value Ref Range   WBC 8.8 4.0  - 10.5 K/uL   RBC 4.35 4.22 - 5.81 MIL/uL   Hemoglobin 13.3 13.0 - 17.0 g/dL   HCT 40.7 39.0 - 52.0 %   MCV 93.6 80.0 - 100.0 fL   MCH 30.6 26.0 - 34.0 pg   MCHC 32.7 30.0 - 36.0 g/dL   RDW 13.6 11.5 - 15.5 %   Platelets 420 (H) 150 - 400 K/uL   nRBC 0.0 0.0 - 0.2 %   Neutrophils Relative % 64 %   Neutro Abs 5.7 1.7 - 7.7 K/uL   Lymphocytes Relative 20 %   Lymphs Abs 1.8 0.7 - 4.0 K/uL   Monocytes Relative 10 %   Monocytes Absolute 0.9 0.1 - 1.0 K/uL   Eosinophils Relative 5 %   Eosinophils Absolute 0.5 0.0 - 0.5 K/uL   Basophils Relative 1 %   Basophils Absolute 0.1 0.0 - 0.1 K/uL   Immature Granulocytes 0 %   Abs Immature Granulocytes 0.02 0.00 - 0.07 K/uL    Comment: Performed at [facility], [place] [address], [place], [place] [address]  Comprehensive metabolic panel     Status: Abnormal   Collection Time: 06/28/18 11:21 PM  Result Value Ref Range   Sodium 140 135 - 145 mmol/L   Potassium 3.5 3.5 - 5.1 mmol/L   Chloride 106 98 - 111 mmol/L   CO2 25 22 - 32 mmol/L   Glucose, Bld 172 (H) 70 - 99 mg/dL   BUN 18 8 - 23 mg/dL   Creatinine, Ser 0.78 0.61 - 1.24 mg/dL   Calcium 9.4 8.9 - 10.3 mg/dL   Total Protein 8.1 6.5 - 8.1 g/dL   Albumin 3.5 3.5 - 5.0 g/dL   AST 28 15 - 41 U/L   ALT 24 0 - 44 U/L   Alkaline Phosphatase 88 38 - 126 U/L   Total Bilirubin 0.3 0.3 - 1.2 mg/dL   GFR calc non Af Amer >60 >60 mL/min   GFR calc Af Amer >60 >60 mL/min    Comment: (NOTE) The eGFR has been calculated using the CKD EPI equation. This calculation has not been validated in all clinical situations. eGFR's persistently <60 mL/min signify possible Chronic Kidney Disease.    Anion gap 9 5 - 15    Comment: Performed at [facility], [place] [address], [place], [place] [address]  Troponin  I     Status: None   Collection Time: 06/28/18 11:21 PM  Result Value Ref Range   Troponin I <0.03 <0.03 ng/mL    Comment: Performed at [facility], [place] [address], [place], [place] [address]   Dg Chest Portable 1 View  Result Date: 06/29/2018 CLINICAL DATA:  Difficulty breathing for 2 days. Shortness of breath. History of COPD, hypertension, pneumonia. EXAM: PORTABLE CHEST 1 VIEW COMPARISON:  05/27/2018 FINDINGS: Normal heart size and pulmonary vascularity. Lungs are clear and expanded. No airspace disease or consolidation. No blunting of costophrenic angles. No pneumothorax. Endotracheal tube is present. Old left rib fractures. IMPRESSION: No active disease. Electronically Signed   By: Lucienne Capers M.D.   On: 06/29/2018 00:17   EKG Interpretation  Date/Time:  Tuesday June 28 2018 23:15:32 EDT Ventricular Rate:  117 PR Interval:    QRS Duration: 82 QT Interval:  311 QTC Calculation: 434 R Axis:   80 Text Interpretation:  Sinus tachycardia Biatrial enlargement Anterior infarct, old No significant change since last tracing Confirmed by Ward, Cyril Mourning [phone]) on 06/28/2018 11:18:43 PM   Pending Labs Unresulted Labs (From admission, onward)    Start     Ordered   06/29/18 0309  HIV antibody  Once,   R     06/29/18 0316          Vitals/Pain Today's Vitals   06/29/18 0330 06/29/18 0400 06/29/18 0430 06/29/18 0449  BP: 136/86 (!) 146/88 (!) 143/88 (!) 143/93  Pulse: 97 (!) 101 94 93  Resp:    16  Temp:    98.4 F (36.9 C)  TempSrc:    Oral  SpO2: 97% 95% 96% 100%  Weight:      Height:      PainSc:    0-No pain    Isolation Precautions No active isolations  Medications Medications  enoxaparin (LOVENOX) injection 40 mg (40 mg Subcutaneous Not Given 06/29/18 0430)  doxycycline (VIBRA-TABS) tablet 100 mg (100 mg Oral Not Given 06/29/18 0431)  predniSONE (DELTASONE) tablet 40 mg (has no administration in time range)  umeclidinium-vilanterol (ANORO ELLIPTA) 62.5-25 MCG/INH 1 puff (has no administration in time range)  albuterol (PROVENTIL) (2.5 MG/3ML) 0.083% nebulizer solution 2.5 mg (has no  administration in time range)  benzonatate (TESSALON) capsule 200 mg (has no administration in time range)  terazosin (HYTRIN) capsule 5 mg (has no administration in time range)  pantoprazole (PROTONIX) EC tablet 40 mg (has no administration in time range)  losartan (COZAAR) tablet 25 mg (has no administration in time range)  hydrochlorothiazide (HYDRODIURIL) tablet 25 mg (has no administration in time range)  finasteride (PROSCAR) tablet 5 mg (has no administration in time range)  bisacodyl (DULCOLAX) EC tablet 10 mg (has no administration in time range)  loratadine (CLARITIN) tablet 10 mg (has no administration in time range)  famotidine (PEPCID) tablet 20 mg (has no administration in time range)  albuterol (PROVENTIL) (2.5 MG/3ML) 0.083% nebulizer solution 5 mg (5 mg Nebulization Given 06/28/18 2217)  sodium chloride 0.9 % bolus 500 mL (0 mLs Intravenous Stopped 06/29/18 0010)  methylPREDNISolone sodium succinate (SOLU-MEDROL) 125 mg/2 mL injection 125 mg (125 mg Intravenous Given 06/28/18 2317)  magnesium sulfate IVPB 1 g 100 mL (0 g Intravenous Stopped 06/29/18 0028)  albuterol (PROVENTIL,VENTOLIN) solution continuous neb (10 mg/hr Nebulization Given 06/29/18 0013)  ipratropium (ATROVENT) nebulizer solution 0.5 mg (0.5 mg Nebulization Given 06/29/18 0013)    Mobility walks

## 2018-06-29 NOTE — Discharge Summary (Signed)
Discharge Summary  [name] [name] PJA:[id number] DOB: 1951-06-14  PCP: [name], MD (Inactive)  Admit date: 06/28/2018 Discharge date: 06/29/2018   Time spent: < 25 minutes  Admitted From: home Disposition:  home  Recommendations for Outpatient Follow-up:  1. Follow up with PCP in 1-2 weeks 2. Trach care clinic, to follow with [facility] regarding tracheostomy removal     Discharge Diagnoses:  Active Hospital Problems   Diagnosis Date Noted  . COPD exacerbation ([place]) 03/06/2018  . Acute respiratory failure with hypoxia ([place]) 05/27/2018  . Essential hypertension 04/02/2015    Resolved Hospital Problems  No resolved problems to display.    Discharge Condition: Stable   CODE STATUS:Full code  Diet recommendation:     History of present illness:  [name] [name] is a 67 y.o. year old male with medical history significant for COPD, with chronic tracheostomy, chronic pansinusitis, hypertension who presented on 10/15 with cough, shortness of breath and wheezing consistent with COPD exacerbation.  Remaining hospital course addressed in problem based format below:   Hospital Course:   Acute hypoxic respiratory failure secondary to COPD exacerbation, resolved.  Upon initial admission patient was requiring very high levels of oxygen up to 10 L via trach mask however with scheduled breathing treatments, initiation of oral prednisone and doxycycline patient was able to de-escalate to room air ( he is on no O2 at home) within 24 hours. CXR showed no acute abnormalities. He benefited from frequent suctioning. Of note he mentions he had to order a new passy muir valve for his chronic trach.  Patient ambulated with physical therapy and maintain normal oxygen saturations on room air with SPO2 92%.  Patient will continue doxycycline pertinent SPO2 92%.  Patient will continue doxycycline and prednisone for additional 4 days on discharge continue home inhaler regimen.  Of note patient is  followed by [facility] for trach will be evaluated next week for possible removal.  All other chronic medical condition were stable during the hospitalization.  Patient was ambulatory without any assistance. On the day of the discharge the patient's vitals were stable , and no other acute medical condition were reported by patient. the patient was felt safe to be discharge at home with family.  Consultations:  none  Procedures/Studies: none  Discharge Exam: BP (!) 143/93   Pulse (!) 107 Comment: ambulating  Temp 98.4 F (36.9 C) (Oral)   Resp 18   Ht 5\' 5"  (1.651 m)   Wt 64.1 kg   SpO2 92%   BMI 23.52 kg/m  Gen- lying in bed comfortably, no distress HEENT- trach in place, clean, dry Resp- on room air, normal respiratory effort, decreased breath sounds throughout, occassional rhonchi, no wheezing Abd-soft, ND,NT Extremities- no swelling   Discharge Instructions You were cared for by a hospitalist during your hospital stay. If you have any questions about your discharge medications or the care you received while you were in the hospital after you are discharged, you can call the unit and asked to speak with the hospitalist on call if the hospitalist that took care of you is not available. Once you are discharged, your primary care physician will handle any further medical issues. Please note that NO REFILLS for any discharge medications will be authorized once you are discharged, as it is imperative that you return to your primary care physician (or establish a relationship with a primary care physician if you do not have one) for your aftercare needs so that they can reassess your need for medications  and monitor your lab values.  Discharge Instructions    Diet - low sodium heart healthy   Complete by:  As directed    Increase activity slowly   Complete by:  As directed      Allergies as of 06/29/2018   No Known Allergies     Medication List    TAKE these medications     albuterol (2.5 MG/3ML) 0.083% nebulizer solution Commonly known as:  PROVENTIL Take 3-6 mLs (2.5-5 mg total) by nebulization every 4 (four) hours as needed for wheezing or shortness of breath.   albuterol 108 (90 Base) MCG/ACT inhaler Commonly known as:  PROVENTIL HFA;VENTOLIN HFA Inhale 2 puffs into the lungs every 6 (six) hours as needed for wheezing or shortness of breath.   benzonatate 200 MG capsule Commonly known as:  TESSALON Take 1 capsule (200 mg total) by mouth 2 (two) times daily as needed for cough.   bisacodyl 5 MG EC tablet Generic drug:  bisacodyl Take 10 mg by mouth daily as needed for moderate constipation.   cetirizine 10 MG tablet Commonly known as:  ZYRTEC Take 10 mg by mouth daily as needed for allergies.   CLEAR EYES OP Place 1 drop into both eyes daily as needed (dry eyes).   doxycycline 100 MG tablet Commonly known as:  VIBRA-TABS Take 1 tablet (100 mg total) by mouth every 12 (twelve) hours for 4 days.   famotidine 20 MG tablet Commonly known as:  PEPCID Take 1 tablet (20 mg total) by mouth at bedtime. One at bedtime What changed:    when to take this  additional instructions   finasteride 5 MG tablet Commonly known as:  PROSCAR Take 5 mg by mouth daily.   fluticasone 50 MCG/ACT nasal spray Commonly known as:  FLONASE Place 2 sprays into both nostrils daily as needed for allergies.   Glycopyrrolate-Formoterol 9-4.8 MCG/ACT Aero Inhale 2 puffs into the lungs 2 (two) times daily.   hydrochlorothiazide 25 MG tablet Commonly known as:  HYDRODIURIL Take 1 tablet (25 mg total) by mouth daily.   losartan 25 MG tablet Commonly known as:  COZAAR Take 1 tablet by mouth daily.   multivitamin tablet Take 1 tablet by mouth daily.   pantoprazole 40 MG tablet Commonly known as:  PROTONIX Take 1 tablet (40 mg total) by mouth daily. Take 30-60 min before first meal of the day   predniSONE 20 MG tablet Commonly known as:  DELTASONE Take 2  tablets (40 mg total) by mouth daily with breakfast for 4 days. Start taking on:  06/30/2018   terazosin 5 MG capsule Commonly known as:  HYTRIN Take 5 mg by mouth at bedtime.      No Known Allergies    The results of significant diagnostics from this hospitalization (including imaging, microbiology, ancillary and laboratory) are listed below for reference.    Significant Diagnostic Studies: Dg Chest Portable 1 View  Result Date: 06/29/2018 CLINICAL DATA:  Difficulty breathing for 2 days. Shortness of breath. History of COPD, hypertension, pneumonia. EXAM: PORTABLE CHEST 1 VIEW COMPARISON:  05/27/2018 FINDINGS: Normal heart size and pulmonary vascularity. Lungs are clear and expanded. No airspace disease or consolidation. No blunting of costophrenic angles. No pneumothorax. Endotracheal tube is present. Old left rib fractures. IMPRESSION: No active disease. Electronically Signed   By: Lucienne Capers M.D.   On: 06/29/2018 00:17    Microbiology: No results found for this or any previous visit (from the past 240 hour(s)).   Labs:  Basic Metabolic Panel: Recent Labs  Lab 06/28/18 2321  NA 140  K 3.5  CL 106  CO2 25  GLUCOSE 172*  BUN 18  CREATININE 0.78  CALCIUM 9.4   Liver Function Tests: Recent Labs  Lab 06/28/18 2321  AST 28  ALT 24  ALKPHOS 88  BILITOT 0.3  PROT 8.1  ALBUMIN 3.5   No results for input(s): LIPASE, AMYLASE in the last 168 hours. No results for input(s): AMMONIA in the last 168 hours. CBC: Recent Labs  Lab 06/28/18 2321  WBC 8.8  NEUTROABS 5.7  HGB 13.3  HCT 40.7  MCV 93.6  PLT 420*   Cardiac Enzymes: Recent Labs  Lab 06/28/18 2321  TROPONINI <0.03   BNP: BNP (last 3 results) Recent Labs    01/10/18 1013 03/06/18 0741 05/05/18 0313  BNP 10.3 16.4 11.7    ProBNP (last 3 results) No results for input(s): PROBNP in the last 8760 hours.  CBG: No results for input(s): GLUCAP in the last 168 hours.     Signed:  Desiree Hane, MD [facility] 06/29/2018, 9:04 PM

## 2018-06-29 NOTE — ED Notes (Signed)
Call from family contact information given (daughter) Donato Heinz [phone], (Wife) Marjory Sneddon [phone]. Please contact them if their is any change in condition.

## 2018-06-29 NOTE — ED Notes (Signed)
Lab called at results inprogress

## 2018-07-04 DIAGNOSIS — Z930 Tracheostomy status: Secondary | ICD-10-CM | POA: Diagnosis not present

## 2018-07-04 DIAGNOSIS — J441 Chronic obstructive pulmonary disease with (acute) exacerbation: Secondary | ICD-10-CM | POA: Diagnosis not present

## 2018-07-04 DIAGNOSIS — I10 Essential (primary) hypertension: Secondary | ICD-10-CM | POA: Diagnosis not present

## 2018-07-04 DIAGNOSIS — K219 Gastro-esophageal reflux disease without esophagitis: Secondary | ICD-10-CM | POA: Diagnosis not present

## 2018-07-04 DIAGNOSIS — Z23 Encounter for immunization: Secondary | ICD-10-CM | POA: Diagnosis not present

## 2018-07-05 ENCOUNTER — Ambulatory Visit: Payer: Self-pay | Admitting: Internal Medicine

## 2018-07-06 DIAGNOSIS — D141 Benign neoplasm of larynx: Secondary | ICD-10-CM | POA: Diagnosis not present

## 2018-07-06 DIAGNOSIS — R490 Dysphonia: Secondary | ICD-10-CM | POA: Diagnosis not present

## 2018-07-06 DIAGNOSIS — J3802 Paralysis of vocal cords and larynx, bilateral: Secondary | ICD-10-CM | POA: Diagnosis not present

## 2018-07-06 DIAGNOSIS — Q898 Other specified congenital malformations: Secondary | ICD-10-CM | POA: Diagnosis not present

## 2018-07-06 DIAGNOSIS — Z930 Tracheostomy status: Secondary | ICD-10-CM | POA: Diagnosis not present

## 2018-07-06 DIAGNOSIS — B3789 Other sites of candidiasis: Secondary | ICD-10-CM | POA: Diagnosis not present

## 2018-07-06 DIAGNOSIS — R0600 Dyspnea, unspecified: Secondary | ICD-10-CM | POA: Diagnosis not present

## 2018-07-06 DIAGNOSIS — J383 Other diseases of vocal cords: Secondary | ICD-10-CM | POA: Diagnosis not present

## 2018-07-08 DIAGNOSIS — J3802 Paralysis of vocal cords and larynx, bilateral: Secondary | ICD-10-CM | POA: Diagnosis not present

## 2018-07-08 DIAGNOSIS — Z430 Encounter for attention to tracheostomy: Secondary | ICD-10-CM | POA: Diagnosis not present

## 2018-07-09 ENCOUNTER — Emergency Department (HOSPITAL_COMMUNITY)
Admission: EM | Admit: 2018-07-09 | Discharge: 2018-07-09 | Disposition: A | Discharge status: Home / Self Care | Payer: Medicare Other | Attending: Emergency Medicine | Admitting: Emergency Medicine

## 2018-07-09 ENCOUNTER — Other Ambulatory Visit: Payer: Self-pay

## 2018-07-09 ENCOUNTER — Emergency Department (HOSPITAL_COMMUNITY): Payer: Medicare Other

## 2018-07-09 DIAGNOSIS — Z930 Tracheostomy status: Secondary | ICD-10-CM | POA: Insufficient documentation

## 2018-07-09 DIAGNOSIS — R000 Tachycardia, unspecified: Secondary | ICD-10-CM | POA: Diagnosis not present

## 2018-07-09 DIAGNOSIS — J441 Chronic obstructive pulmonary disease with (acute) exacerbation: Secondary | ICD-10-CM | POA: Diagnosis not present

## 2018-07-09 DIAGNOSIS — R0902 Hypoxemia: Secondary | ICD-10-CM | POA: Diagnosis not present

## 2018-07-09 DIAGNOSIS — I10 Essential (primary) hypertension: Secondary | ICD-10-CM | POA: Insufficient documentation

## 2018-07-09 DIAGNOSIS — R062 Wheezing: Secondary | ICD-10-CM | POA: Diagnosis not present

## 2018-07-09 DIAGNOSIS — Z743 Need for continuous supervision: Secondary | ICD-10-CM | POA: Diagnosis not present

## 2018-07-09 DIAGNOSIS — R0602 Shortness of breath: Secondary | ICD-10-CM | POA: Diagnosis not present

## 2018-07-09 DIAGNOSIS — Z79899 Other long term (current) drug therapy: Secondary | ICD-10-CM | POA: Insufficient documentation

## 2018-07-09 DIAGNOSIS — J449 Chronic obstructive pulmonary disease, unspecified: Secondary | ICD-10-CM | POA: Diagnosis not present

## 2018-07-09 LAB — CBC WITH DIFFERENTIAL/PLATELET
Abs Immature Granulocytes: 0.07 10*3/uL (ref 0.00–0.07)
BASOS PCT: 1 %
Basophils Absolute: 0.1 10*3/uL (ref 0.0–0.1)
EOS ABS: 0.1 10*3/uL (ref 0.0–0.5)
EOS PCT: 1 %
HEMATOCRIT: 42.6 % (ref 39.0–52.0)
Hemoglobin: 14.1 g/dL (ref 13.0–17.0)
Immature Granulocytes: 1 %
Lymphocytes Relative: 5 %
Lymphs Abs: 0.6 10*3/uL — ABNORMAL LOW (ref 0.7–4.0)
MCH: 29.9 pg (ref 26.0–34.0)
MCHC: 33.1 g/dL (ref 30.0–36.0)
MCV: 90.3 fL (ref 80.0–100.0)
MONO ABS: 0.2 10*3/uL (ref 0.1–1.0)
MONOS PCT: 2 %
Neutro Abs: 10.9 10*3/uL — ABNORMAL HIGH (ref 1.7–7.7)
Neutrophils Relative %: 90 %
Platelets: 446 10*3/uL — ABNORMAL HIGH (ref 150–400)
RBC: 4.72 MIL/uL (ref 4.22–5.81)
RDW: 13.2 % (ref 11.5–15.5)
WBC: 11.9 10*3/uL — AB (ref 4.0–10.5)
nRBC: 0 % (ref 0.0–0.2)

## 2018-07-09 LAB — BASIC METABOLIC PANEL
Anion gap: 10 (ref 5–15)
BUN: 9 mg/dL (ref 8–23)
CALCIUM: 9.2 mg/dL (ref 8.9–10.3)
CO2: 25 mmol/L (ref 22–32)
CREATININE: 0.69 mg/dL (ref 0.61–1.24)
Chloride: 101 mmol/L (ref 98–111)
GFR calc Af Amer: >60 mL/min (ref >60)
GFR calc non Af Amer: >60 mL/min (ref >60)
Glucose, Bld: 114 mg/dL — ABNORMAL HIGH (ref 70–99)
Potassium: 3.7 mmol/L (ref 3.5–5.1)
SODIUM: 136 mmol/L (ref 135–145)

## 2018-07-09 MED ORDER — MAGNESIUM SULFATE 2 GM/50ML IV SOLN
2.0000 g | Freq: Once | INTRAVENOUS | Status: AC
  Administered 2018-07-09: 2 g via INTRAVENOUS
  Filled 2018-07-09: qty 50

## 2018-07-09 MED ORDER — PREDNISONE 20 MG PO TABS: 40.0000 mg | ORAL_TABLET | Freq: Every day | ORAL | 0 refills | Status: DC

## 2018-07-09 MED ORDER — ALBUTEROL (5 MG/ML) CONTINUOUS INHALATION SOLN
10.0000 mg/h | INHALATION_SOLUTION | Freq: Once | RESPIRATORY_TRACT | Status: AC
  Administered 2018-07-09: 10 mg/h via RESPIRATORY_TRACT
  Filled 2018-07-09: qty 20

## 2018-07-09 MED ORDER — ALBUTEROL SULFATE HFA 108 (90 BASE) MCG/ACT IN AERS
2.0000 | INHALATION_SPRAY | RESPIRATORY_TRACT | Status: DC | PRN
  Administered 2018-07-09: 2 via RESPIRATORY_TRACT
  Filled 2018-07-09: qty 6.7

## 2018-07-09 NOTE — ED Notes (Signed)
PROVIDED PATIENT WITH SANDWICH AND SODA. WILL AMBULATE AFTER THE PT EATS.

## 2018-07-09 NOTE — ED Notes (Signed)
Bed: WA07 Expected date:  Expected time:  Means of arrival:  Comments: [facility]

## 2018-07-09 NOTE — ED Triage Notes (Signed)
Patient brought in via EMS, [facility] since 07/08/18, hx of COPD, trach in place, A&Ox4, o2 sat RA per EMS 88 w/ diffuse wheezing all lobes, ronchi left lobe, patient did albuterol x2 at home, EMS administered 10 mg albuterol , 0.5  Atrovent, 125 solumedrol, o2 sat increased to 98%

## 2018-07-09 NOTE — ED Notes (Signed)
Patient ambulated 150 ft and maintained 02 saturation of 95%
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
## 2018-07-11 IMAGING — CT CT HEAD W/O CM
3 of 4 series · 14 of 47 positions shown, 16 images · non-contrast
Comparison: 09/18/2015

CLINICAL DATA: Pt states that he has had a headache in the frontal
area of his head since yesterday. He states that ibuprofen has not
relieved it

EXAM:
CT HEAD WITHOUT CONTRAST
TECHNIQUE: Contiguous axial images were obtained from the base of the skull
through the vertex without intravenous contrast.

[Series 2: head w/o · axial · non-contrast · 0.45mm/px · z∈[-154,-34]mm · 8 of 28 slices shown, 10 images]
[im 2/28  brain]
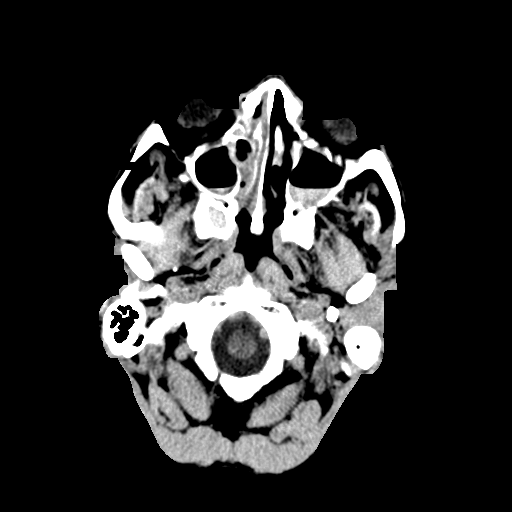
[im 2/28  bone]
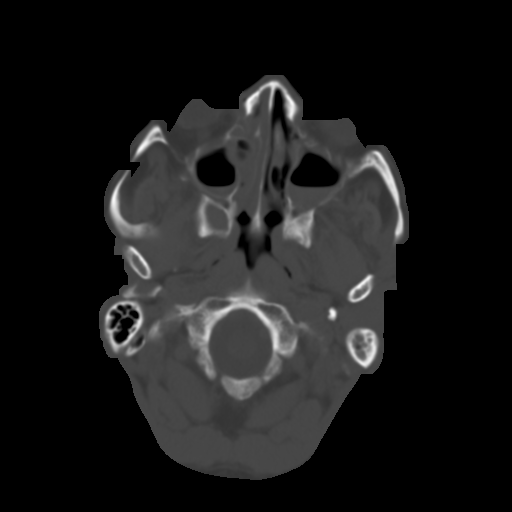
[im 6/28  brain]
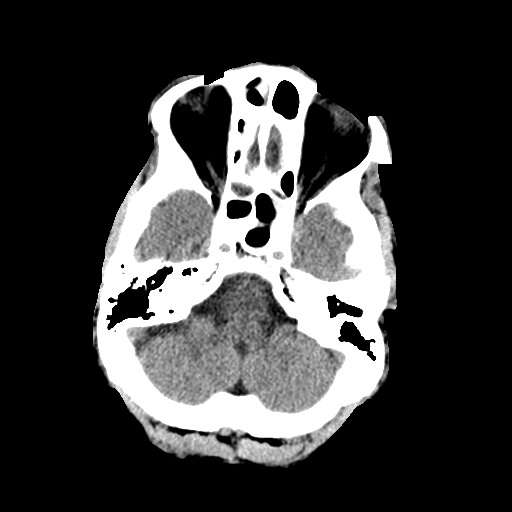
[im 10/28  brain]
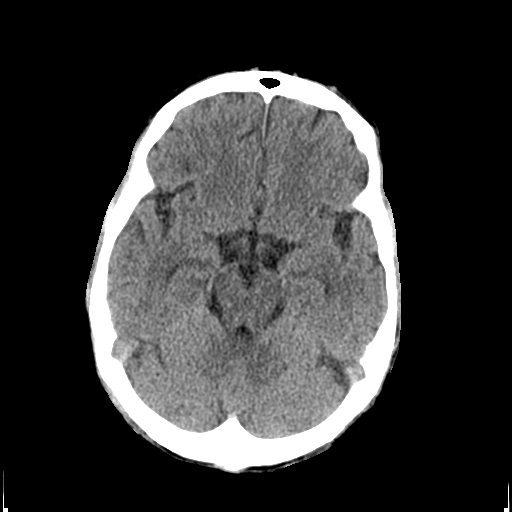
[im 12/28  brain]
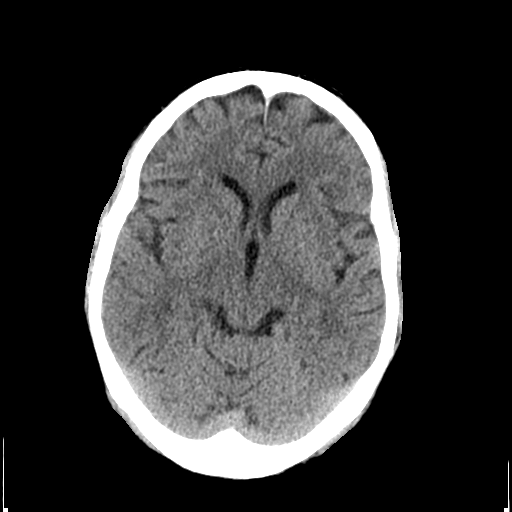
[im 16/28  brain]
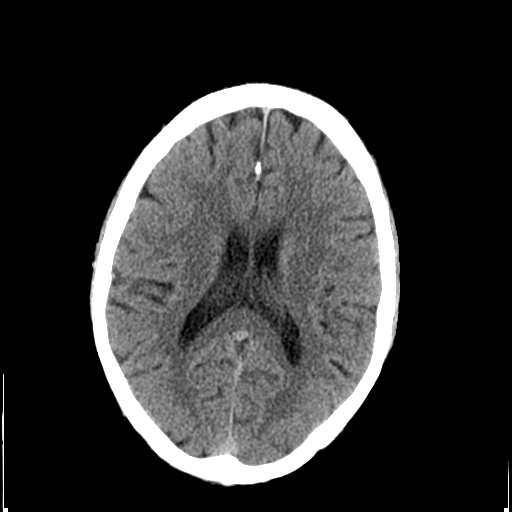
[im 16/28  bone]
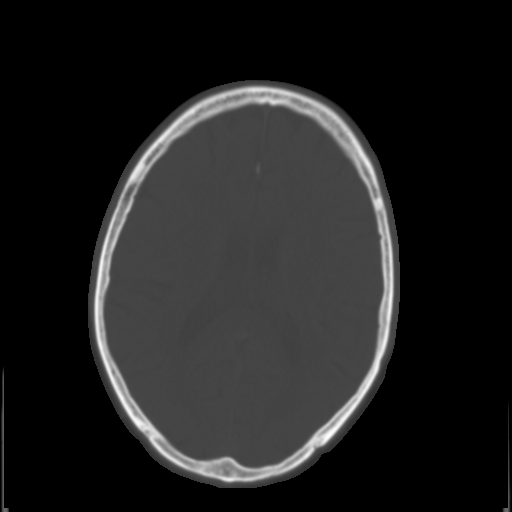
[im 18/28  brain]
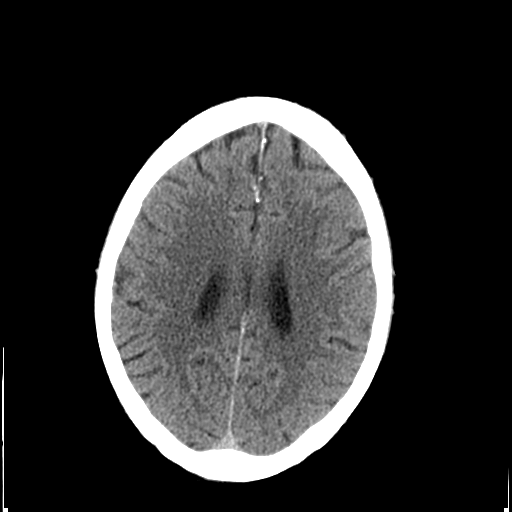
[im 22/28  brain]
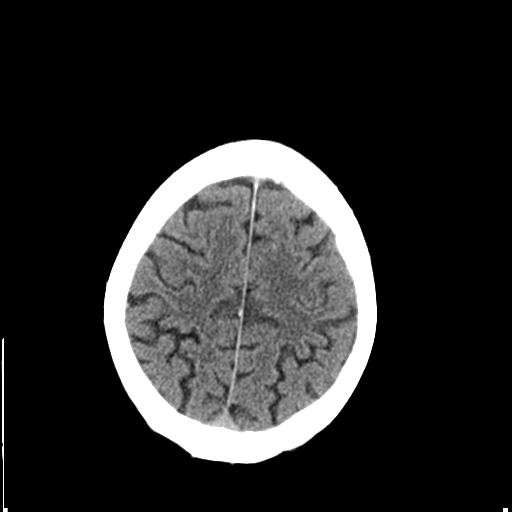
[im 26/28  brain]
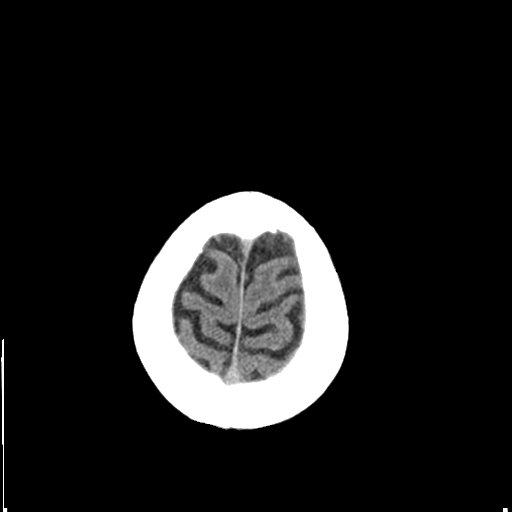

[Series 4: sagittal · sagittal · 0.31mm/px · 3 of 61 slices shown]
[im 21/61  brain]
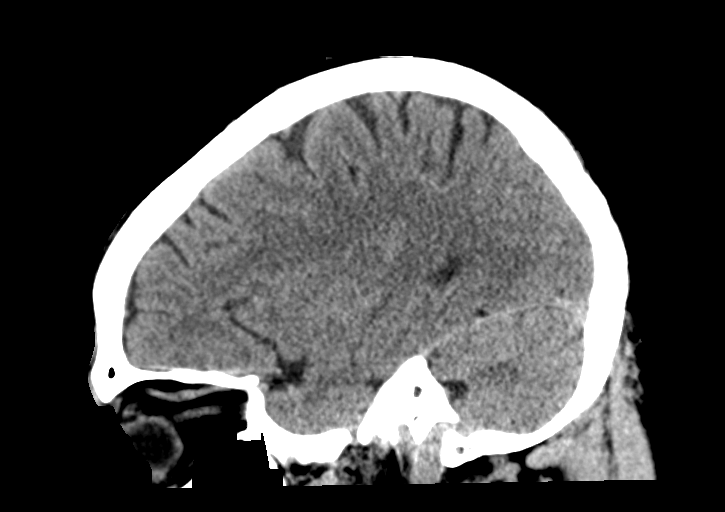
[im 31/61  brain]
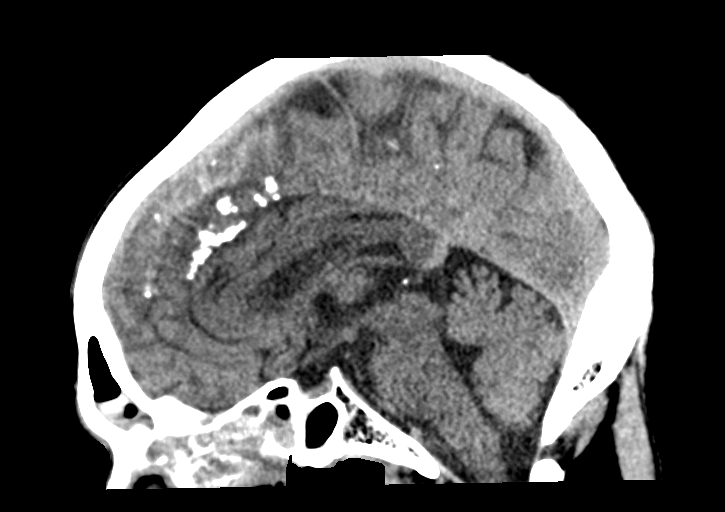
[im 41/61  brain]
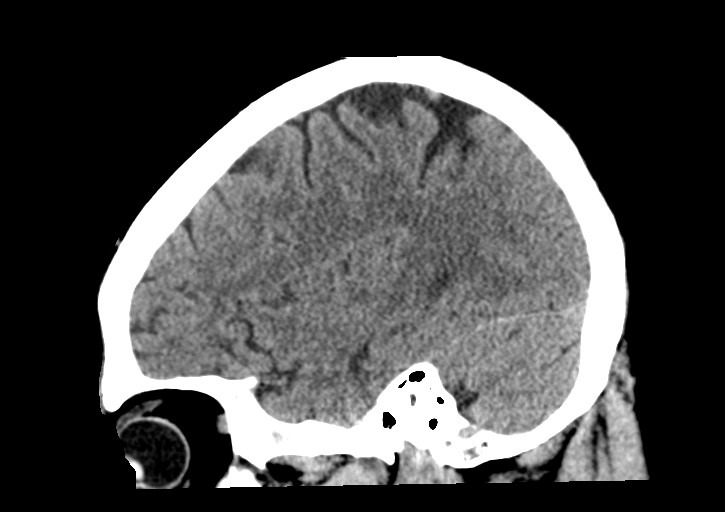

[Series 5: coronal · coronal · 0.29mm/px · 3 of 70 slices shown]
[im 24/70  brain]
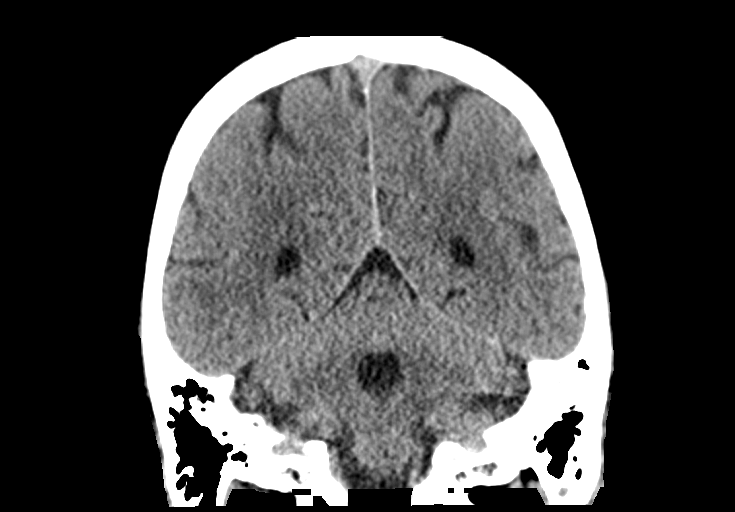
[im 31/70  brain]
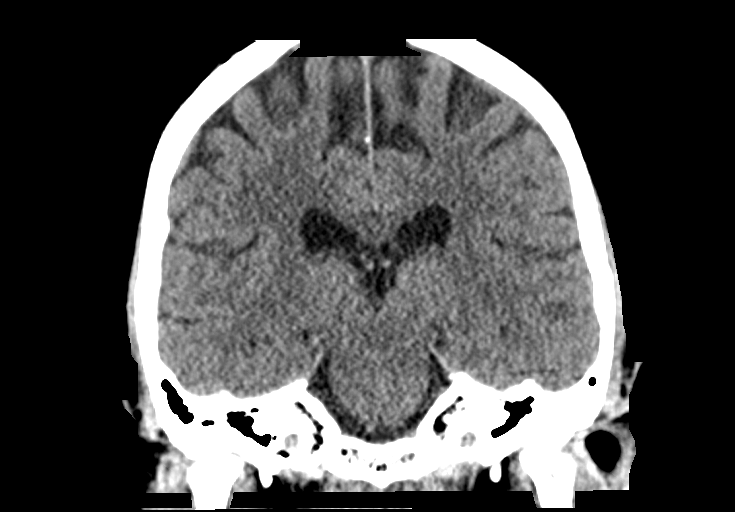
[im 39/70  brain]
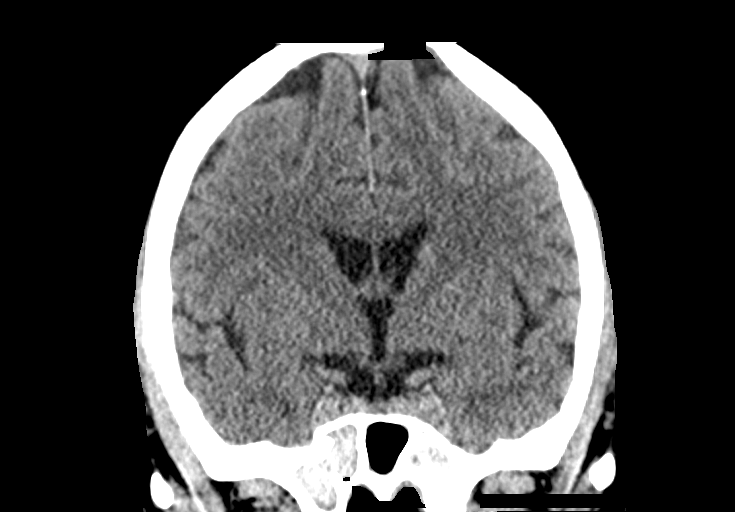

[14 of 47 positions shown; findings below may reference images not displayed]

FINDINGS: Brain: No evidence of acute infarction, hemorrhage, hydrocephalus,
extra-axial collection or mass lesion/mass effect.

Vascular: No hyperdense vessel or unexpected calcification.

Skull: Normal. Negative for fracture or focal lesion.

Sinuses/Orbits: There is significant sinus disease. Dependent fluid
lies in both maxillary sinuses. There is dependent fluid in the
sphenoid sinuses. Moderate mucosal thickening lines the ethmoid,
sphenoid and maxillary sinuses. Mild mucosal thickening noted along
the inferior frontal sinuses. Several left mastoid air cells show
fluid attenuation.

Globes and orbits are unremarkable.

Other: None
IMPRESSION: 1. No intracranial abnormality.
2. Significant sinus disease including air-fluid levels. Acute
sinusitis is suspected as the source of this patient's headache.

## 2018-07-14 ENCOUNTER — Encounter: Payer: Self-pay | Admitting: Internal Medicine

## 2018-07-14 ENCOUNTER — Ambulatory Visit (INDEPENDENT_AMBULATORY_CARE_PROVIDER_SITE_OTHER): Payer: Medicare Other | Admitting: Internal Medicine

## 2018-07-14 DIAGNOSIS — R058 Other specified cough: Secondary | ICD-10-CM

## 2018-07-14 DIAGNOSIS — R05 Cough: Secondary | ICD-10-CM | POA: Diagnosis not present

## 2018-07-14 DIAGNOSIS — R0609 Other forms of dyspnea: Secondary | ICD-10-CM

## 2018-07-14 DIAGNOSIS — J4530 Mild persistent asthma, uncomplicated: Secondary | ICD-10-CM

## 2018-07-14 MED ORDER — ALBUTEROL SULFATE (2.5 MG/3ML) 0.083% IN NEBU: 2.5000 mg | INHALATION_SOLUTION | RESPIRATORY_TRACT | 0 refills | Status: DC | PRN

## 2018-07-14 MED ORDER — BUDESONIDE-FORMOTEROL FUMARATE 160-4.5 MCG/ACT IN AERO: 2.0000 | INHALATION_SPRAY | Freq: Two times a day (BID) | RESPIRATORY_TRACT | 0 refills | Status: DC

## 2018-07-14 MED ORDER — BUDESONIDE-FORMOTEROL FUMARATE 160-4.5 MCG/ACT IN AERO: INHALATION_SPRAY | RESPIRATORY_TRACT | 11 refills | Status: DC
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
## 2018-07-14 NOTE — Patient Instructions (Addendum)
Whenever you are having any problem breathing, take the valve off your trach - for example, try walking to the store with it on vs off and tell your throat doctor what difference you note.  Plan A = Automatic = change to symbicort 160 2puff every 12 hours and stop BEVESPI  Work on inhaler technique:  relax and gently blow all the way out then take a nice smooth deep breath back in, triggering the inhaler at same time you start breathing in.  Hold for up to 5 seconds if you can. Blow out thru nose. Rinse and gargle with water when done      Plan B = Backup Only use your albuterol as a rescue medication to be used if you can't catch your breath by resting or doing a relaxed purse lip breathing pattern.  - The less you use it, the better it will work when you need it. - Ok to use the inhaler up to 2 puffs  every 4 hours if you must but call for appointment if use goes up over your usual need - Don't leave home without it !!  (think of it like the spare tire for your car)   Plan C = Crisis - only use your albuterol nebulizer if you first try Plan B and it fails to help > ok to use the nebulizer up to every 4 hours but if start needing it regularly call for immediate appointment   Please schedule a follow up office visit in 6 weeks, call sooner if needed

## 2018-07-16 ENCOUNTER — Other Ambulatory Visit: Payer: Self-pay | Admitting: Internal Medicine

## 2018-07-17 ENCOUNTER — Encounter: Payer: Self-pay | Admitting: Internal Medicine

## 2018-07-17 NOTE — Assessment & Plan Note (Signed)
07/14/2018  Walked RA x 3 laps @ 185 ft each stopped due to  End of study, nl to fast pace, no sob or desats once the pmv was removed p the first lap with loud breathing heard "around the corner" with pmv in place and pt clearly working hard to breath     >>> rec try activities req exertion without the pmv to report to ent the diffence with it on vs off prior to consideration of removing the trach   I had an extended discussion with the patient reviewing all relevant studies completed to date and  lasting 15 to 20 minutes of a 25 minute visit    See device teaching which extended face to face time for this visit.  Each maintenance medication was reviewed in detail including emphasizing most importantly the difference between maintenance and prns and under what circumstances the prns are to be triggered using an action plan format that is not reflected in the computer generated alphabetically organized AVS which I have not found useful in most complex patients, especially with respiratory illnesses  Please see AVS for specific instructions unique to this visit that I personally wrote and verbalized to the the pt in detail and then reviewed with pt  by my nurse highlighting any  changes in therapy recommended at today's visit to their plan of care.

## 2018-07-17 NOTE — Assessment & Plan Note (Signed)
10/30/2016    try reduce symb to 80 2bid  - 01/21/2017  After extensive coaching HFA effectiveness =    75% via adaptor to trach  - Spirometry 12/14/2017  FEV1 1.59 (66%)  Ratio 65 after symb with poor hfa  - 12/14/2017  After extensive coaching inhaler device  effectiveness =    75% from a baseline of 25 % > continue symb 160 2bid  - PFT's  01/14/2018  FEV1 1.32 (51 % ) ratio 63  p 3 % improvement from saba p ? prior to study with DLCO  58/61 % corrects to 118  % for alv volume - 01/14/2018  After extensive coaching inhaler device  effectiveness =    75% try change to bevespi and floor dose of pred @ 10 mg daily > did not do - on singulair as of ov 07/14/2018   - Spirometry 07/14/2018  FEV1 1.2  (48%)  Ratio 61  - 07/14/2018  After extensive coaching inhaler device,  effectiveness =    75% with hfa > change from bevespi to symbicort 160 2bid   >>>> Continues to struggle with issue of how much is vcd vs asthma even with trach in place (see doe) but I do think he has component of chronic asthma that is not responding to singulair at this point and rec change back to symbicort 160 2bid / continue singulair and if still flaring add back daily prednisone

## 2018-07-17 NOTE — Assessment & Plan Note (Signed)
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

## 2018-07-19 DIAGNOSIS — J3489 Other specified disorders of nose and nasal sinuses: Secondary | ICD-10-CM | POA: Diagnosis not present

## 2018-07-21 DIAGNOSIS — Z9889 Other specified postprocedural states: Secondary | ICD-10-CM | POA: Diagnosis not present

## 2018-07-21 DIAGNOSIS — Z430 Encounter for attention to tracheostomy: Secondary | ICD-10-CM | POA: Diagnosis not present

## 2018-07-21 DIAGNOSIS — D141 Benign neoplasm of larynx: Secondary | ICD-10-CM | POA: Diagnosis not present

## 2018-07-21 DIAGNOSIS — Q319 Congenital malformation of larynx, unspecified: Secondary | ICD-10-CM | POA: Diagnosis not present

## 2018-07-21 DIAGNOSIS — Z930 Tracheostomy status: Secondary | ICD-10-CM | POA: Diagnosis not present

## 2018-07-21 DIAGNOSIS — J383 Other diseases of vocal cords: Secondary | ICD-10-CM | POA: Diagnosis not present

## 2018-07-21 DIAGNOSIS — R0600 Dyspnea, unspecified: Secondary | ICD-10-CM | POA: Diagnosis not present

## 2018-07-21 DIAGNOSIS — B3789 Other sites of candidiasis: Secondary | ICD-10-CM | POA: Diagnosis not present

## 2018-07-21 DIAGNOSIS — J3802 Paralysis of vocal cords and larynx, bilateral: Secondary | ICD-10-CM | POA: Diagnosis not present

## 2018-07-27 ENCOUNTER — Emergency Department (HOSPITAL_COMMUNITY)
Admission: EM | Admit: 2018-07-27 | Discharge: 2018-07-27 | Disposition: A | Discharge status: Home / Self Care | Payer: Medicare Other | Attending: Emergency Medicine | Admitting: Emergency Medicine

## 2018-07-27 ENCOUNTER — Other Ambulatory Visit: Payer: Self-pay

## 2018-07-27 ENCOUNTER — Emergency Department (HOSPITAL_COMMUNITY): Payer: Medicare Other

## 2018-07-27 ENCOUNTER — Encounter (HOSPITAL_COMMUNITY): Payer: Self-pay

## 2018-07-27 DIAGNOSIS — J441 Chronic obstructive pulmonary disease with (acute) exacerbation: Secondary | ICD-10-CM

## 2018-07-27 DIAGNOSIS — R062 Wheezing: Secondary | ICD-10-CM | POA: Diagnosis not present

## 2018-07-27 DIAGNOSIS — R0602 Shortness of breath: Secondary | ICD-10-CM | POA: Diagnosis not present

## 2018-07-27 DIAGNOSIS — R0689 Other abnormalities of breathing: Secondary | ICD-10-CM | POA: Diagnosis not present

## 2018-07-27 DIAGNOSIS — R000 Tachycardia, unspecified: Secondary | ICD-10-CM | POA: Diagnosis not present

## 2018-07-27 DIAGNOSIS — Z743 Need for continuous supervision: Secondary | ICD-10-CM | POA: Diagnosis not present

## 2018-07-27 LAB — BASIC METABOLIC PANEL
ANION GAP: 10 (ref 5–15)
BUN: 18 mg/dL (ref 8–23)
CALCIUM: 9.2 mg/dL (ref 8.9–10.3)
CO2: 24 mmol/L (ref 22–32)
Chloride: 103 mmol/L (ref 98–111)
Creatinine, Ser: 0.8 mg/dL (ref 0.61–1.24)
GFR calc Af Amer: >60 mL/min (ref >60)
GLUCOSE: 131 mg/dL — AB (ref 70–99)
Potassium: 3.2 mmol/L — ABNORMAL LOW (ref 3.5–5.1)
SODIUM: 137 mmol/L (ref 135–145)

## 2018-07-27 LAB — CBC WITH DIFFERENTIAL/PLATELET
Abs Immature Granulocytes: 0.03 10*3/uL (ref 0.00–0.07)
BASOS PCT: 0 %
Basophils Absolute: 0 10*3/uL (ref 0.0–0.1)
EOS ABS: 0.4 10*3/uL (ref 0.0–0.5)
Eosinophils Relative: 4 %
HCT: 39.1 % (ref 39.0–52.0)
Hemoglobin: 12.9 g/dL — ABNORMAL LOW (ref 13.0–17.0)
IMMATURE GRANULOCYTES: 0 %
Lymphocytes Relative: 11 %
Lymphs Abs: 1 10*3/uL (ref 0.7–4.0)
MCH: 30.5 pg (ref 26.0–34.0)
MCHC: 33 g/dL (ref 30.0–36.0)
MCV: 92.4 fL (ref 80.0–100.0)
MONO ABS: 0.3 10*3/uL (ref 0.1–1.0)
MONOS PCT: 4 %
NEUTROS PCT: 81 %
Neutro Abs: 6.8 10*3/uL (ref 1.7–7.7)
PLATELETS: 313 10*3/uL (ref 150–400)
RBC: 4.23 MIL/uL (ref 4.22–5.81)
RDW: 13.2 % (ref 11.5–15.5)
WBC: 8.5 10*3/uL (ref 4.0–10.5)
nRBC: 0 % (ref 0.0–0.2)

## 2018-07-27 MED ORDER — AEROCHAMBER PLUS FLO-VU MEDIUM MISC
1.0000 | Freq: Once | Status: AC
  Administered 2018-07-27: 1
  Filled 2018-07-27: qty 1

## 2018-07-27 MED ORDER — ALBUTEROL SULFATE (2.5 MG/3ML) 0.083% IN NEBU
10.0000 mg | INHALATION_SOLUTION | Freq: Once | RESPIRATORY_TRACT | Status: AC
  Administered 2018-07-27: 10 mg via RESPIRATORY_TRACT

## 2018-07-27 MED ORDER — PREDNISONE 10 MG (21) PO TBPK: ORAL_TABLET | ORAL | 0 refills | Status: DC

## 2018-07-27 NOTE — ED Notes (Signed)
Ambulated pt in the hall his O2 went down to 94% but quickly went back up to 97%

## 2018-07-27 NOTE — ED Notes (Signed)
Bed: TM19 Expected date:  Expected time:  Means of arrival:  Comments: EMS/Shob/trach(no vent)
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
## 2018-07-27 NOTE — ED Triage Notes (Signed)
He c/o "being more short of breath than usual" since yesterday. He arrives here in no distress; having received Duoneb neb. And 125 mg IV methylprednisolone.

## 2018-08-03 DIAGNOSIS — J441 Chronic obstructive pulmonary disease with (acute) exacerbation: Secondary | ICD-10-CM | POA: Diagnosis not present

## 2018-08-03 DIAGNOSIS — Z930 Tracheostomy status: Secondary | ICD-10-CM | POA: Diagnosis not present

## 2018-08-05 DIAGNOSIS — Z930 Tracheostomy status: Secondary | ICD-10-CM | POA: Diagnosis not present

## 2018-08-05 DIAGNOSIS — Z430 Encounter for attention to tracheostomy: Secondary | ICD-10-CM | POA: Diagnosis not present

## 2018-08-05 IMAGING — CR DG CHEST 2V
2 series · 2 of 2 positions shown · non-contrast
Comparison: 10/18/2016

CLINICAL DATA: Shortness of breath since this morning.

EXAM:
CHEST  2 VIEW

[w chest pa]
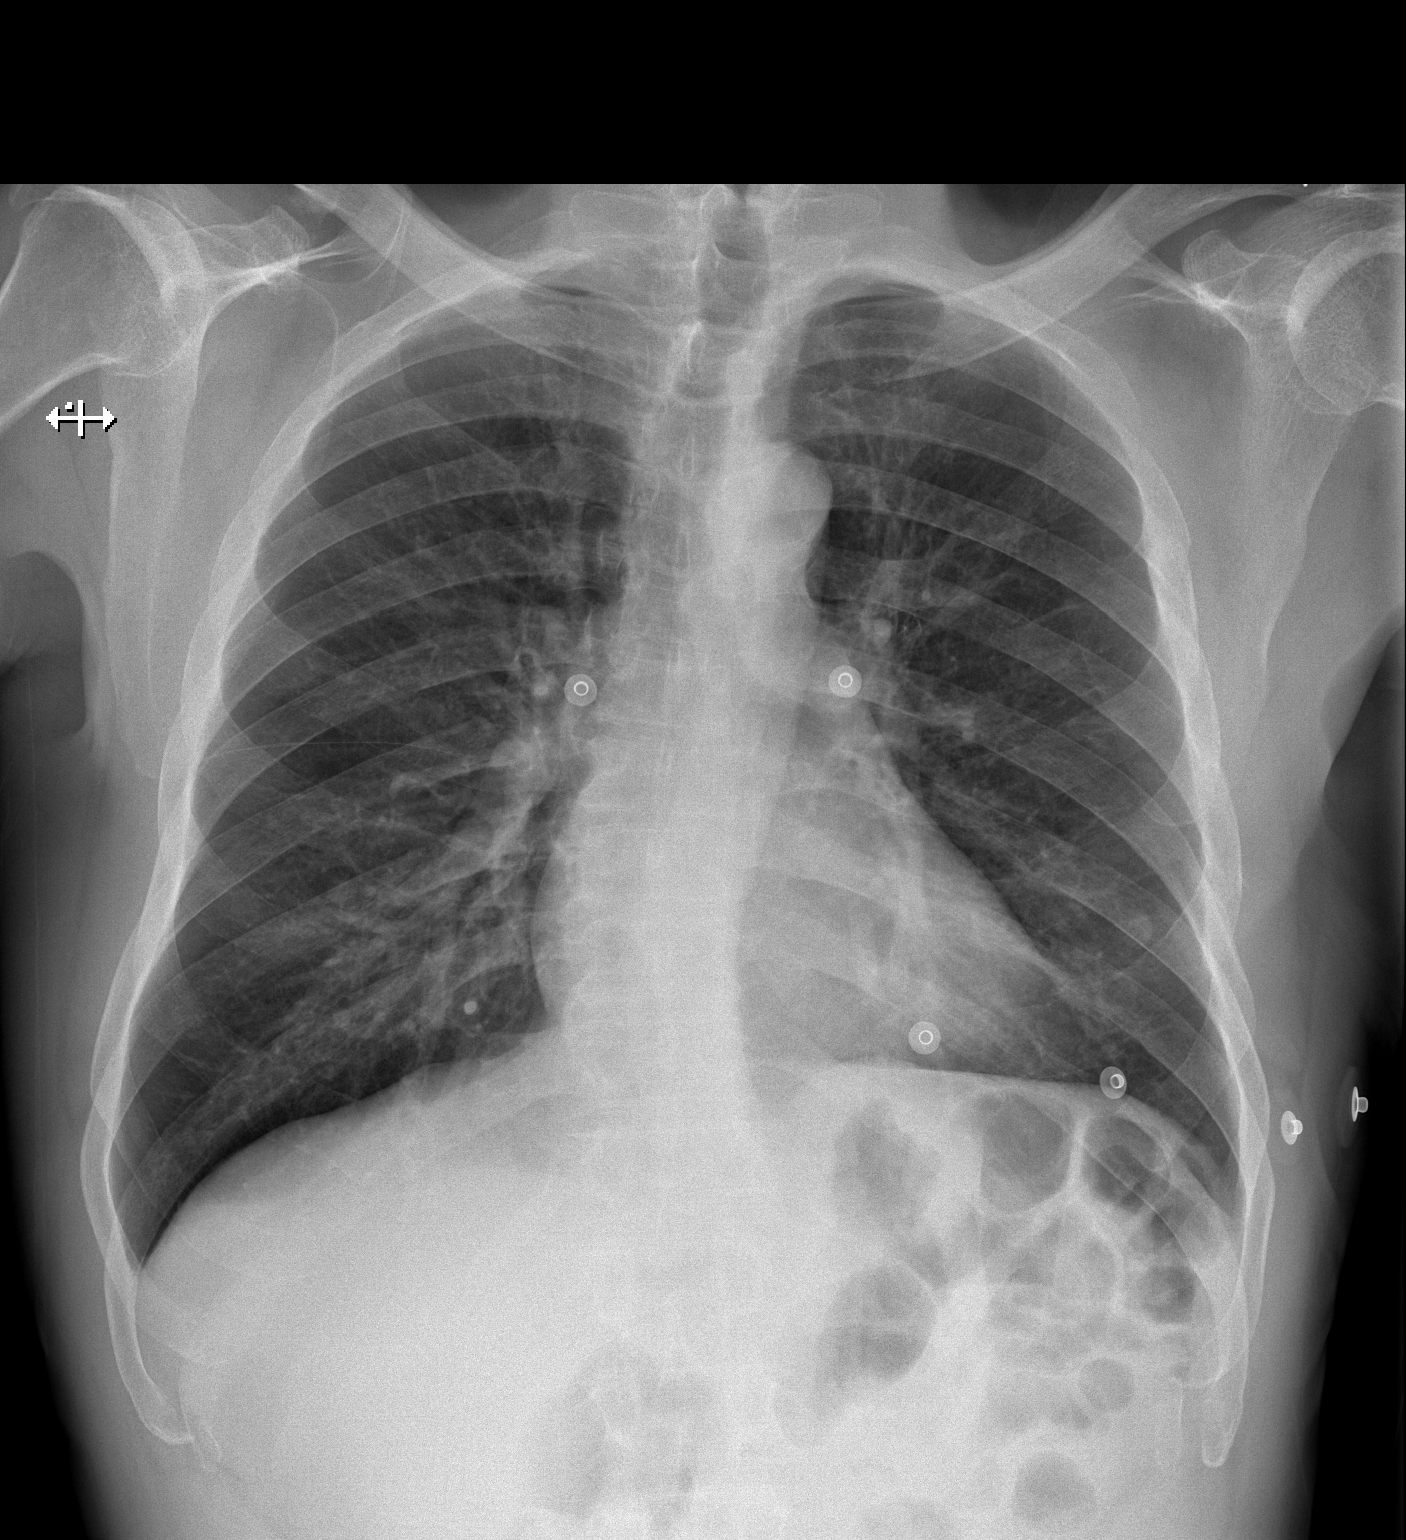

[w chest lat]
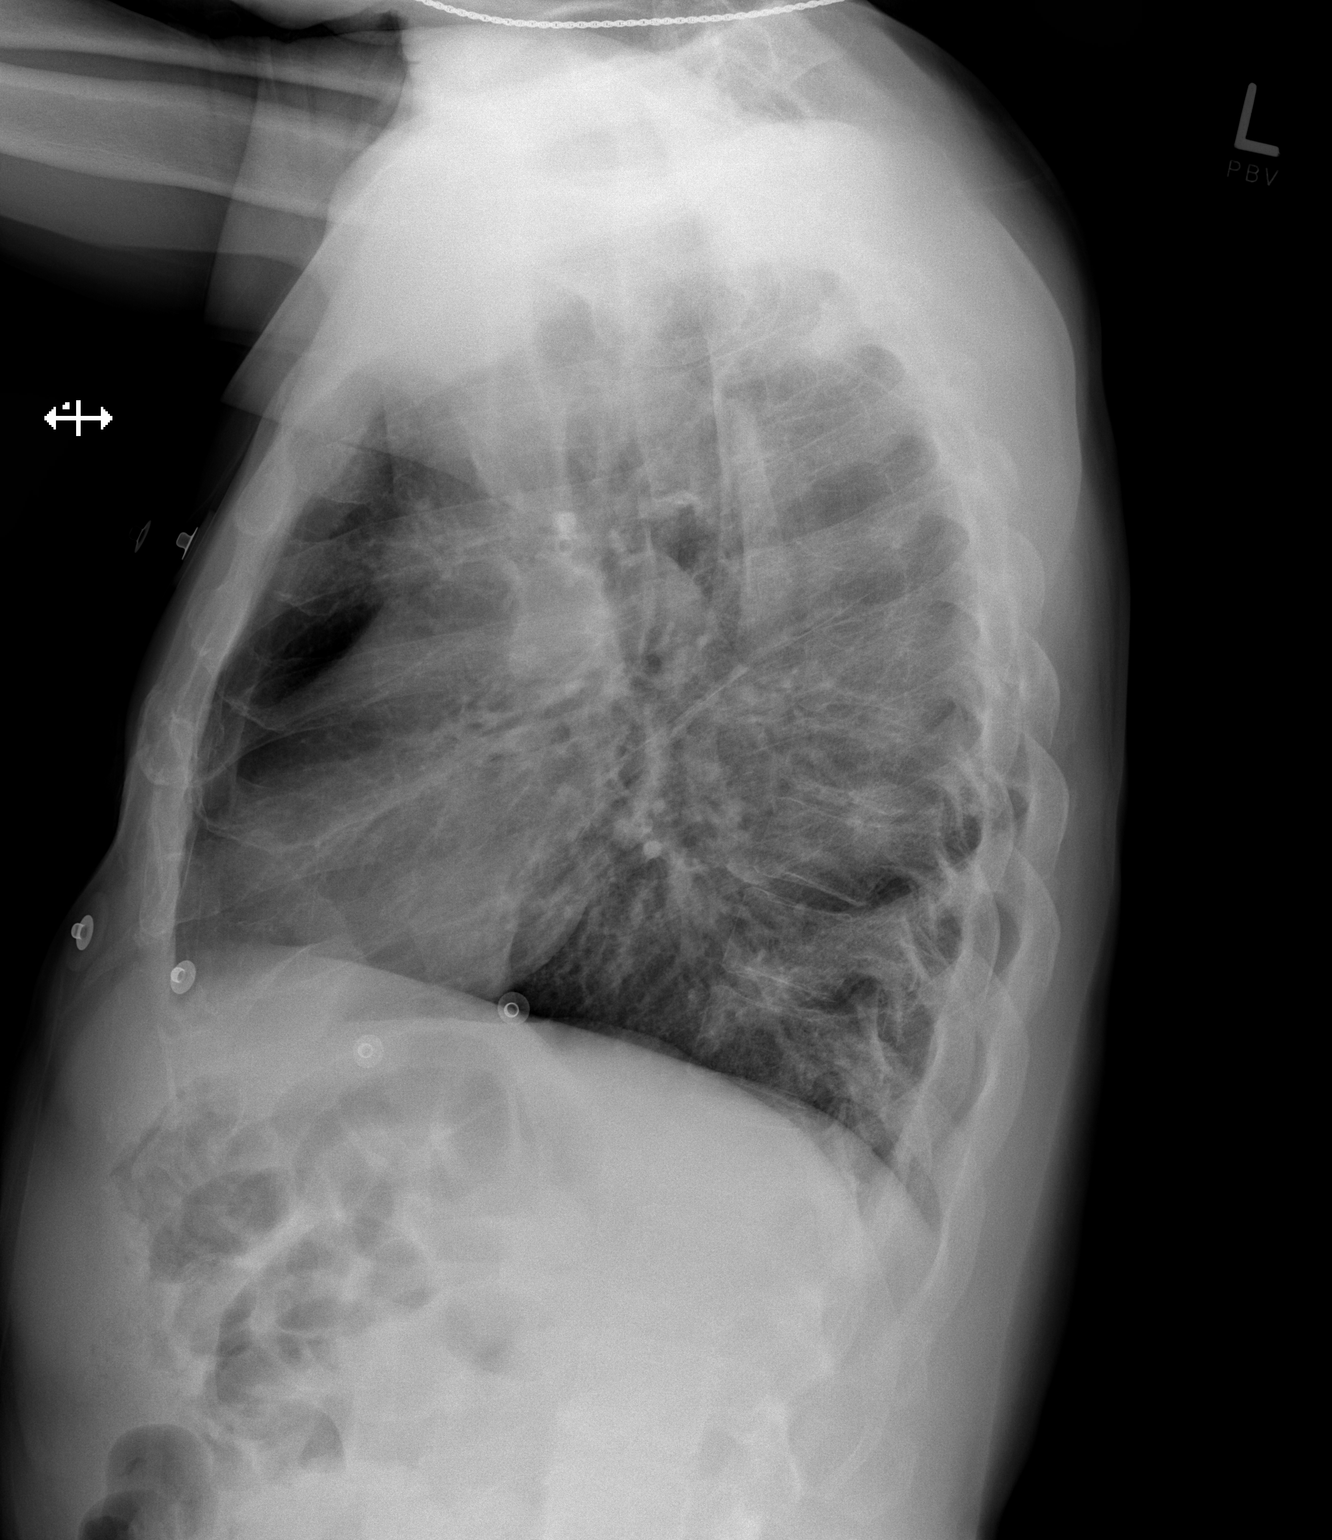

[2 of 2 positions shown; findings below may reference images not displayed]

FINDINGS: The cardiac silhouette, mediastinal and hilar contours are withina
normal limits and stable. Mild central vascular congestion without
pulmonary edema. No pleural effusions or focal infiltrates.
Bilateral nipple shadows are noted.
IMPRESSION: Mild vascular congestion but no infiltrates, edema or effusions.

## 2018-08-15 DIAGNOSIS — R061 Stridor: Secondary | ICD-10-CM | POA: Diagnosis not present

## 2018-08-15 DIAGNOSIS — R0609 Other forms of dyspnea: Secondary | ICD-10-CM | POA: Diagnosis not present

## 2018-08-15 DIAGNOSIS — J386 Stenosis of larynx: Secondary | ICD-10-CM | POA: Diagnosis not present

## 2018-08-15 DIAGNOSIS — J3802 Paralysis of vocal cords and larynx, bilateral: Secondary | ICD-10-CM | POA: Diagnosis not present

## 2018-08-15 DIAGNOSIS — I10 Essential (primary) hypertension: Secondary | ICD-10-CM | POA: Diagnosis not present

## 2018-08-15 DIAGNOSIS — Z930 Tracheostomy status: Secondary | ICD-10-CM | POA: Diagnosis not present

## 2018-08-24 ENCOUNTER — Encounter: Payer: Self-pay | Admitting: Internal Medicine

## 2018-08-24 ENCOUNTER — Ambulatory Visit (INDEPENDENT_AMBULATORY_CARE_PROVIDER_SITE_OTHER): Payer: Medicare Other | Admitting: Internal Medicine

## 2018-08-24 VITALS — BP 108/60 | HR 84 | Ht 65.0 in | Wt 155.4 lb

## 2018-08-24 DIAGNOSIS — R058 Other specified cough: Secondary | ICD-10-CM

## 2018-08-24 DIAGNOSIS — R05 Cough: Secondary | ICD-10-CM | POA: Diagnosis not present

## 2018-08-24 DIAGNOSIS — J4530 Mild persistent asthma, uncomplicated: Secondary | ICD-10-CM | POA: Diagnosis not present

## 2018-08-24 MED ORDER — BUDESONIDE-FORMOTEROL FUMARATE 160-4.5 MCG/ACT IN AERO: 2.0000 | INHALATION_SPRAY | Freq: Two times a day (BID) | RESPIRATORY_TRACT | 0 refills | Status: DC

## 2018-08-24 NOTE — Patient Instructions (Signed)
Work on inhaler technique:  relax and gently blow all the way out then take a nice smooth deep breath back in, triggering the inhaler at same time you start breathing in.  Hold for up to 5 seconds if you can. Blow out thru nose. Rinse and gargle with water when done      Please schedule a follow up visit in 3 months but call sooner if needed  with all medications /inhalers/ solutions in hand so we can verify exactly what you are taking. This includes all medications from all doctors and over the counters
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
## 2018-08-25 ENCOUNTER — Ambulatory Visit: Payer: Self-pay | Admitting: Internal Medicine

## 2018-08-25 DIAGNOSIS — J3802 Paralysis of vocal cords and larynx, bilateral: Secondary | ICD-10-CM | POA: Diagnosis not present

## 2018-08-25 DIAGNOSIS — Z430 Encounter for attention to tracheostomy: Secondary | ICD-10-CM | POA: Diagnosis not present

## 2018-08-28 ENCOUNTER — Encounter: Payer: Self-pay | Admitting: Internal Medicine

## 2018-08-28 ENCOUNTER — Other Ambulatory Visit: Payer: Self-pay | Admitting: Internal Medicine

## 2018-08-28 DIAGNOSIS — R058 Other specified cough: Secondary | ICD-10-CM

## 2018-08-28 DIAGNOSIS — R05 Cough: Secondary | ICD-10-CM

## 2018-08-28 NOTE — Assessment & Plan Note (Signed)
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 for second opinion> trach December 16 2016 > Trach surgery 03/15/17   / trach out end of summer /early fall 2018  10/27/17 ENT f/u "Ok" airway  - 01/14/2018 PFT f/u loop C/w mod severe truncation of insp loop > rx gerd and referred back to [facility]>  Re trach 03/14/18    He is clearly improving and now tolerate plugging of trach manually and using pmv at night with much improved doe with trach open to air eg with walking to store   No changes in resp rx needed > trach care per [facility]

## 2018-08-28 NOTE — Assessment & Plan Note (Signed)
10/30/2016    try reduce symb to 80 2bid  - 01/21/2017  After extensive coaching HFA effectiveness =    75% via adaptor to trach  - Spirometry 12/14/2017  FEV1 1.59 (66%)  Ratio 65 after symb with poor hfa  - 12/14/2017  After extensive coaching inhaler device  effectiveness =    75% from a baseline of 25 % > continue symb 160 2bid  - PFT's  01/14/2018  FEV1 1.32 (51 % ) ratio 63  p 3 % improvement from saba p ? prior to study with DLCO  58/61 % corrects to 118  % for alv volume - 01/14/2018  After extensive coaching inhaler device  effectiveness =    75% try change to bevespi and floor dose of pred @ 10 mg daily > did not do - on singulair as of ov 07/14/2018  - Spirometry 07/14/2018  FEV1 1.2  (48%)  Ratio 61  - 07/14/2018   change from bevespi to symbicort 160 2bid     08/24/2018  After extensive coaching inhaler device,  effectiveness =    75%     All goals of chronic asthma control met including optimal function and elimination of symptoms with minimal need for rescue therapy.  Contingencies discussed in full including contacting this office immediately if not controlling the symptoms using the rule of two's.      I had an extended discussion with the patient reviewing all relevant studies completed to date and  lasting 15 to 20 minutes of a 25 minute visit    See device teaching which extended face to face time for this visit.  Each maintenance medication was reviewed in detail including emphasizing most importantly the difference between maintenance and prns and under what circumstances the prns are to be triggered using an action plan format that is not reflected in the computer generated alphabetically organized AVS which I have not found useful in most complex patients, especially with respiratory illnesses  Please see AVS for specific instructions unique to this visit that I personally wrote and verbalized to the the pt in detail and then reviewed with pt  by my nurse highlighting any   changes in therapy recommended at today's visit to their plan of care.

## 2018-09-07 ENCOUNTER — Emergency Department (HOSPITAL_COMMUNITY): Payer: Medicare Other

## 2018-09-07 ENCOUNTER — Other Ambulatory Visit: Payer: Self-pay

## 2018-09-07 ENCOUNTER — Encounter (HOSPITAL_COMMUNITY): Payer: Self-pay | Admitting: Emergency Medicine

## 2018-09-07 ENCOUNTER — Emergency Department (HOSPITAL_COMMUNITY)
Admission: EM | Admit: 2018-09-07 | Discharge: 2018-09-07 | Disposition: A | Discharge status: Home / Self Care | Payer: Medicare Other | Attending: Emergency Medicine | Admitting: Emergency Medicine

## 2018-09-07 DIAGNOSIS — J441 Chronic obstructive pulmonary disease with (acute) exacerbation: Secondary | ICD-10-CM | POA: Diagnosis not present

## 2018-09-07 DIAGNOSIS — R000 Tachycardia, unspecified: Secondary | ICD-10-CM | POA: Diagnosis not present

## 2018-09-07 DIAGNOSIS — Z930 Tracheostomy status: Secondary | ICD-10-CM | POA: Insufficient documentation

## 2018-09-07 DIAGNOSIS — R0602 Shortness of breath: Secondary | ICD-10-CM | POA: Diagnosis not present

## 2018-09-07 LAB — CBC WITH DIFFERENTIAL/PLATELET
ABS IMMATURE GRANULOCYTES: 0.03 10*3/uL (ref 0.00–0.07)
Basophils Absolute: 0.1 10*3/uL (ref 0.0–0.1)
Basophils Relative: 1 %
EOS PCT: 3 %
Eosinophils Absolute: 0.3 10*3/uL (ref 0.0–0.5)
HCT: 39.5 % (ref 39.0–52.0)
HEMOGLOBIN: 13.3 g/dL (ref 13.0–17.0)
Immature Granulocytes: 0 %
LYMPHS PCT: 16 %
Lymphs Abs: 1.4 10*3/uL (ref 0.7–4.0)
MCH: 30.2 pg (ref 26.0–34.0)
MCHC: 33.7 g/dL (ref 30.0–36.0)
MCV: 89.6 fL (ref 80.0–100.0)
MONO ABS: 0.6 10*3/uL (ref 0.1–1.0)
Monocytes Relative: 7 %
NEUTROS ABS: 6.7 10*3/uL (ref 1.7–7.7)
Neutrophils Relative %: 73 %
Platelets: 313 10*3/uL (ref 150–400)
RBC: 4.41 MIL/uL (ref 4.22–5.81)
RDW: 13.5 % (ref 11.5–15.5)
WBC: 9.1 10*3/uL (ref 4.0–10.5)
nRBC: 0 % (ref 0.0–0.2)

## 2018-09-07 LAB — COMPREHENSIVE METABOLIC PANEL
ALBUMIN: 3.5 g/dL (ref 3.5–5.0)
ALK PHOS: 69 U/L (ref 38–126)
ALT: 25 U/L (ref 0–44)
AST: 27 U/L (ref 15–41)
Anion gap: 11 (ref 5–15)
BUN: 10 mg/dL (ref 8–23)
CALCIUM: 9.2 mg/dL (ref 8.9–10.3)
CO2: 23 mmol/L (ref 22–32)
CREATININE: 0.85 mg/dL (ref 0.61–1.24)
Chloride: 102 mmol/L (ref 98–111)
GFR calc Af Amer: >60 mL/min (ref >60)
GFR calc non Af Amer: >60 mL/min (ref >60)
GLUCOSE: 121 mg/dL — AB (ref 70–99)
Potassium: 3.1 mmol/L — ABNORMAL LOW (ref 3.5–5.1)
SODIUM: 136 mmol/L (ref 135–145)
TOTAL PROTEIN: 7.7 g/dL (ref 6.5–8.1)
Total Bilirubin: 1 mg/dL (ref 0.3–1.2)

## 2018-09-07 LAB — I-STAT CHEM 8, ED
BUN: 10 mg/dL (ref 8–23)
CALCIUM ION: 1.15 mmol/L (ref 1.15–1.40)
CREATININE: 0.9 mg/dL (ref 0.61–1.24)
Chloride: 102 mmol/L (ref 98–111)
Glucose, Bld: 118 mg/dL — ABNORMAL HIGH (ref 70–99)
HCT: 40 % (ref 39.0–52.0)
HEMOGLOBIN: 13.6 g/dL (ref 13.0–17.0)
Potassium: 3.1 mmol/L — ABNORMAL LOW (ref 3.5–5.1)
Sodium: 136 mmol/L (ref 135–145)
TCO2: 24 mmol/L (ref 22–32)

## 2018-09-07 LAB — I-STAT TROPONIN, ED: TROPONIN I, POC: 0.01 ng/mL (ref 0.00–0.08)

## 2018-09-07 MED ORDER — ALBUTEROL SULFATE (2.5 MG/3ML) 0.083% IN NEBU
5.0000 mg | INHALATION_SOLUTION | Freq: Once | RESPIRATORY_TRACT | Status: AC
  Administered 2018-09-07: 5 mg via RESPIRATORY_TRACT
  Filled 2018-09-07: qty 6

## 2018-09-07 MED ORDER — SODIUM CHLORIDE 0.9 % IV BOLUS
1000.0000 mL | Freq: Once | INTRAVENOUS | Status: AC
  Administered 2018-09-07: 1000 mL via INTRAVENOUS

## 2018-09-07 MED ORDER — PREDNISONE 20 MG PO TABS: 40.0000 mg | ORAL_TABLET | Freq: Every day | ORAL | 0 refills | Status: DC

## 2018-09-07 MED ORDER — AZITHROMYCIN 250 MG PO TABS: 250.0000 mg | ORAL_TABLET | Freq: Every day | ORAL | 0 refills | Status: DC

## 2018-09-07 MED ORDER — AZITHROMYCIN 250 MG PO TABS
500.0000 mg | ORAL_TABLET | Freq: Once | ORAL | Status: AC
  Administered 2018-09-07: 500 mg via ORAL
  Filled 2018-09-07: qty 2

## 2018-09-07 MED ORDER — METHYLPREDNISOLONE SODIUM SUCC 125 MG IJ SOLR
80.0000 mg | Freq: Once | INTRAMUSCULAR | Status: AC
  Administered 2018-09-07: 80 mg via INTRAVENOUS
  Filled 2018-09-07: qty 2

## 2018-09-07 NOTE — ED Provider Notes (Signed)
MSE was initiated and I personally evaluated the patient and placed orders (if any) at  2:31 PM on September 07, 2018.  The patient appears stable so that the remainder of the MSE may be completed by another provider.  [name] [name] is a 67 y.o. male history of trach initially done at 2017, a revision surgery done at [facility] earlier this month, here presenting with increased secretions from trach, SOB. Patient has been suctioning trach about 5-10 times daily, has some cough as well. Denies any fevers. I was called since patient has moderate respiratory distress. Mildly tachycardic. Wheezing L lung base. Will get portal CXR and neck xray, will have respiratory therapist suction out the trach and give nebs. Will get labs as well.     Drenda Freeze, MD 09/07/18 [phone]

## 2018-09-07 NOTE — ED Notes (Signed)
Pt resting quietly, no complaints voiced,.
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
## 2018-09-07 NOTE — ED Notes (Signed)
Pt able to ambulate without assistance and with steady gait.

## 2018-09-07 NOTE — ED Notes (Signed)
Got patient undress on the monitor did ekg shown to Dr Darl Householder patient is resting with cal bell in reach and family at bedside
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
## 2018-09-12 DIAGNOSIS — Z930 Tracheostomy status: Secondary | ICD-10-CM | POA: Diagnosis not present

## 2018-09-12 DIAGNOSIS — Z430 Encounter for attention to tracheostomy: Secondary | ICD-10-CM | POA: Diagnosis not present

## 2018-09-23 DIAGNOSIS — Z1389 Encounter for screening for other disorder: Secondary | ICD-10-CM | POA: Diagnosis not present

## 2018-09-23 DIAGNOSIS — Z0000 Encounter for general adult medical examination without abnormal findings: Secondary | ICD-10-CM | POA: Diagnosis not present

## 2018-09-23 DIAGNOSIS — R7303 Prediabetes: Secondary | ICD-10-CM | POA: Diagnosis not present

## 2018-09-23 DIAGNOSIS — Z1159 Encounter for screening for other viral diseases: Secondary | ICD-10-CM | POA: Diagnosis not present

## 2018-09-23 DIAGNOSIS — I10 Essential (primary) hypertension: Secondary | ICD-10-CM | POA: Diagnosis not present

## 2018-09-29 DIAGNOSIS — Z930 Tracheostomy status: Secondary | ICD-10-CM | POA: Diagnosis not present

## 2018-09-29 DIAGNOSIS — D141 Benign neoplasm of larynx: Secondary | ICD-10-CM | POA: Insufficient documentation

## 2018-09-29 DIAGNOSIS — R490 Dysphonia: Secondary | ICD-10-CM | POA: Insufficient documentation

## 2018-09-29 DIAGNOSIS — B3789 Other sites of candidiasis: Secondary | ICD-10-CM | POA: Diagnosis not present

## 2018-09-29 DIAGNOSIS — J3802 Paralysis of vocal cords and larynx, bilateral: Secondary | ICD-10-CM | POA: Diagnosis not present

## 2018-09-29 DIAGNOSIS — Z48813 Encounter for surgical aftercare following surgery on the respiratory system: Secondary | ICD-10-CM | POA: Diagnosis not present

## 2018-09-29 DIAGNOSIS — R0600 Dyspnea, unspecified: Secondary | ICD-10-CM | POA: Diagnosis not present

## 2018-09-29 DIAGNOSIS — J386 Stenosis of larynx: Secondary | ICD-10-CM | POA: Diagnosis not present

## 2018-10-02 ENCOUNTER — Encounter (HOSPITAL_COMMUNITY): Payer: Self-pay | Admitting: Emergency Medicine

## 2018-10-02 ENCOUNTER — Emergency Department (HOSPITAL_COMMUNITY): Payer: Medicare Other

## 2018-10-02 ENCOUNTER — Emergency Department (HOSPITAL_COMMUNITY)
Admission: EM | Admit: 2018-10-02 | Discharge: 2018-10-02 | Disposition: A | Discharge status: Home / Self Care | Payer: Medicare Other | Attending: Emergency Medicine | Admitting: Emergency Medicine

## 2018-10-02 ENCOUNTER — Other Ambulatory Visit: Payer: Self-pay

## 2018-10-02 DIAGNOSIS — R079 Chest pain, unspecified: Secondary | ICD-10-CM | POA: Diagnosis not present

## 2018-10-02 DIAGNOSIS — Z79899 Other long term (current) drug therapy: Secondary | ICD-10-CM | POA: Diagnosis not present

## 2018-10-02 DIAGNOSIS — J441 Chronic obstructive pulmonary disease with (acute) exacerbation: Secondary | ICD-10-CM | POA: Diagnosis not present

## 2018-10-02 DIAGNOSIS — R0602 Shortness of breath: Secondary | ICD-10-CM | POA: Diagnosis not present

## 2018-10-02 DIAGNOSIS — I10 Essential (primary) hypertension: Secondary | ICD-10-CM | POA: Diagnosis not present

## 2018-10-02 DIAGNOSIS — R05 Cough: Secondary | ICD-10-CM | POA: Diagnosis not present

## 2018-10-02 DIAGNOSIS — R509 Fever, unspecified: Secondary | ICD-10-CM | POA: Diagnosis not present

## 2018-10-02 DIAGNOSIS — R000 Tachycardia, unspecified: Secondary | ICD-10-CM | POA: Diagnosis not present

## 2018-10-02 LAB — COMPREHENSIVE METABOLIC PANEL
ALT: 22 U/L (ref 0–44)
AST: 22 U/L (ref 15–41)
Albumin: 3.5 g/dL (ref 3.5–5.0)
Alkaline Phosphatase: 85 U/L (ref 38–126)
Anion gap: 10 (ref 5–15)
BILIRUBIN TOTAL: 0.7 mg/dL (ref 0.3–1.2)
BUN: 12 mg/dL (ref 8–23)
CO2: 23 mmol/L (ref 22–32)
Calcium: 9.2 mg/dL (ref 8.9–10.3)
Chloride: 104 mmol/L (ref 98–111)
Creatinine, Ser: 0.8 mg/dL (ref 0.61–1.24)
GFR calc non Af Amer: >60 mL/min (ref >60)
GLUCOSE: 120 mg/dL — AB (ref 70–99)
POTASSIUM: 3.8 mmol/L (ref 3.5–5.1)
Sodium: 137 mmol/L (ref 135–145)
TOTAL PROTEIN: 7.6 g/dL (ref 6.5–8.1)

## 2018-10-02 LAB — CBC WITH DIFFERENTIAL/PLATELET
Abs Immature Granulocytes: 0.02 10*3/uL (ref 0.00–0.07)
BASOS ABS: 0.1 10*3/uL (ref 0.0–0.1)
BASOS PCT: 1 %
EOS ABS: 0.6 10*3/uL — AB (ref 0.0–0.5)
EOS PCT: 6 %
HEMATOCRIT: 40.2 % (ref 39.0–52.0)
Hemoglobin: 13.3 g/dL (ref 13.0–17.0)
Immature Granulocytes: 0 %
Lymphocytes Relative: 15 %
Lymphs Abs: 1.6 10*3/uL (ref 0.7–4.0)
MCH: 30.2 pg (ref 26.0–34.0)
MCHC: 33.1 g/dL (ref 30.0–36.0)
MCV: 91.4 fL (ref 80.0–100.0)
Monocytes Absolute: 0.8 10*3/uL (ref 0.1–1.0)
Monocytes Relative: 8 %
NEUTROS PCT: 70 %
NRBC: 0 % (ref 0.0–0.2)
Neutro Abs: 7.5 10*3/uL (ref 1.7–7.7)
PLATELETS: 336 10*3/uL (ref 150–400)
RBC: 4.4 MIL/uL (ref 4.22–5.81)
RDW: 14.2 % (ref 11.5–15.5)
WBC: 10.5 10*3/uL (ref 4.0–10.5)

## 2018-10-02 LAB — INFLUENZA PANEL BY PCR (TYPE A & B)
INFLAPCR: NEGATIVE
Influenza B By PCR: NEGATIVE

## 2018-10-02 LAB — LACTIC ACID, PLASMA
LACTIC ACID, VENOUS: 2.1 mmol/L — AB (ref 0.5–1.9)
Lactic Acid, Venous: 3.2 mmol/L (ref 0.5–1.9)

## 2018-10-02 LAB — I-STAT CG4 LACTIC ACID, ED: Lactic Acid, Venous: 2.98 mmol/L (ref 0.5–1.9)

## 2018-10-02 MED ORDER — METHYLPREDNISOLONE SODIUM SUCC 125 MG IJ SOLR
125.0000 mg | Freq: Once | INTRAMUSCULAR | Status: AC
  Administered 2018-10-02: 125 mg via INTRAVENOUS
  Filled 2018-10-02: qty 2

## 2018-10-02 MED ORDER — SODIUM CHLORIDE 0.9 % IV BOLUS
1000.0000 mL | Freq: Once | INTRAVENOUS | Status: AC
  Administered 2018-10-02: 1000 mL via INTRAVENOUS

## 2018-10-02 MED ORDER — AZITHROMYCIN 250 MG PO TABS: 250.0000 mg | ORAL_TABLET | Freq: Every day | ORAL | 0 refills | Status: DC

## 2018-10-02 MED ORDER — PREDNISONE 20 MG PO TABS: 40.0000 mg | ORAL_TABLET | Freq: Every day | ORAL | 0 refills | Status: DC

## 2018-10-02 MED ORDER — ALBUTEROL (5 MG/ML) CONTINUOUS INHALATION SOLN
15.0000 mg/h | INHALATION_SOLUTION | Freq: Once | RESPIRATORY_TRACT | Status: AC
  Administered 2018-10-02: 15 mg/h via RESPIRATORY_TRACT
  Filled 2018-10-02: qty 20

## 2018-10-02 MED ORDER — SODIUM CHLORIDE 0.9 % IV BOLUS: 1000.0000 mL | Freq: Once | INTRAVENOUS | Status: DC

## 2018-10-02 MED ORDER — PREDNISONE 20 MG PO TABS: 40.0000 mg | ORAL_TABLET | Freq: Every day | ORAL | 0 refills | Status: AC

## 2018-10-02 MED ORDER — DOXYCYCLINE HYCLATE 100 MG PO TABS
100.0000 mg | ORAL_TABLET | Freq: Once | ORAL | Status: AC
  Administered 2018-10-02: 100 mg via ORAL
  Filled 2018-10-02: qty 1

## 2018-10-02 MED ORDER — DOXYCYCLINE HYCLATE 100 MG PO CAPS: 100.0000 mg | ORAL_CAPSULE | Freq: Two times a day (BID) | ORAL | 0 refills | Status: DC

## 2018-10-02 MED ORDER — ALBUTEROL SULFATE HFA 108 (90 BASE) MCG/ACT IN AERS: 2.0000 | INHALATION_SPRAY | Freq: Four times a day (QID) | RESPIRATORY_TRACT | 2 refills | Status: DC | PRN

## 2018-10-02 MED ORDER — IPRATROPIUM BROMIDE 0.02 % IN SOLN
1.0000 mg | Freq: Once | RESPIRATORY_TRACT | Status: AC
  Administered 2018-10-02: 1 mg via RESPIRATORY_TRACT
  Filled 2018-10-02: qty 5

## 2018-10-02 MED ORDER — BUDESONIDE-FORMOTEROL FUMARATE 160-4.5 MCG/ACT IN AERO: INHALATION_SPRAY | RESPIRATORY_TRACT | 11 refills | Status: DC

## 2018-10-02 MED ORDER — IPRATROPIUM BROMIDE 0.02 % IN SOLN: 0.5000 mg | Freq: Once | RESPIRATORY_TRACT | Status: DC

## 2018-10-02 MED ORDER — ALBUTEROL SULFATE (2.5 MG/3ML) 0.083% IN NEBU: 2.5000 mg | INHALATION_SOLUTION | RESPIRATORY_TRACT | 0 refills | Status: DC | PRN
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
## 2018-10-02 NOTE — ED Triage Notes (Signed)
Pt BIB EMS from home with c/o sob and worsening cough x3 days.  Pt has a Therapist, nutritional that was placed at [facility] and has been suctioning more frequent x1 week.   EMS administered 1000mg  of tylenol for elevated temp.

## 2018-10-02 NOTE — Progress Notes (Signed)
Discharge instructions (including medications) discussed with and copy provided to patient/caregiver.  Pt ambulatory, stable, and verbalizes understanding of d/c instructions.

## 2018-10-02 NOTE — Discharge Instructions (Addendum)
Take prednisone, azithromycin as directed.  Use your inhalers and nebulizer treatments as directed.  Follow-up with your primary care doctor in the next 2 to 4 days for further evaluation.  Return the emergency department for any worsening cough, difficulty breathing, chest pain or any other worsening or concerning symptoms.

## 2018-10-05 ENCOUNTER — Other Ambulatory Visit: Payer: Self-pay

## 2018-10-05 NOTE — Patient Outreach (Signed)
[facility]) Care Management  10/05/2018  [name] [name] [birth year] [id number]   Referral Date: 10/05/2018 Referral Source: [facility] referral Referral Reason: 10 ED visits with severe COPD.   Outreach Attempt: Spoke with patient, he whispers due to trach making telephone conversation difficult for CM.  He is able to verify HIPAA. Discussed with patient recent health situation. Patient reports that he has COPD and has a trach.  Patient had a trach originally in 2017 but due to his breathing issues he has had to have it put back in, in August of 2019.  Since that time patient has had multiple ED and hospital visits for COPD.  Patient states he tries to manage as best he can.  Patient unaware of COPD action plan and how to manage with breathing difficulties.  Patient lives alone.  Patient states that he is independent with his care and is able to take things PO despite trach.  Patient also admits to HTN but denies any problems with managing.   Patient states he takes his medications as prescribed and able to afford medications.    Patient states he has an advanced directive with his daughter being his HCPOA.    Discussed [facility] services and support.  Patient agreeable to community nurse services for education and support around COPD management to decrease ED/Hospitalizations.     Plan: RN CM will refer patient to community nurse for high-risk ED/re-hospitalization due to COPD management.    Jone Baseman, RN, MSN [facility] Care Management Care Management Coordinator Direct Line [phone] Toll Free: [phone]  Fax: [phone]

## 2018-10-06 DIAGNOSIS — J324 Chronic pansinusitis: Secondary | ICD-10-CM | POA: Diagnosis not present

## 2018-10-07 LAB — CULTURE, BLOOD (ROUTINE X 2)
Culture: NO GROWTH
Culture: NO GROWTH
SPECIAL REQUESTS: ADEQUATE
Special Requests: ADEQUATE

## 2018-10-10 DIAGNOSIS — J3802 Paralysis of vocal cords and larynx, bilateral: Secondary | ICD-10-CM | POA: Diagnosis not present

## 2018-10-10 DIAGNOSIS — Z430 Encounter for attention to tracheostomy: Secondary | ICD-10-CM | POA: Diagnosis not present

## 2018-10-11 ENCOUNTER — Other Ambulatory Visit: Payer: Self-pay | Admitting: *Deleted

## 2018-10-11 NOTE — Patient Outreach (Signed)
[facility]) Care Management  10/11/2018  [name] [name] 1951/04/14 [id number]   Referral received from telephonic care manager for home assessment and further evaluation of needs.  Per chart he has history of COPD, asthmatic bronchitis, vocal cord paralysis, BPH and malnutrition.  Call placed to member, identity verified.  This care manager introduced self and stated purpose of call.  [facility] care management services explained.  He denies any urgent concerns, agrees to home visit next week.  Advised to contact this care manager with questions.  [name] [name], [place], MSN [place] [phone]

## 2018-10-14 DIAGNOSIS — Z930 Tracheostomy status: Secondary | ICD-10-CM | POA: Diagnosis not present

## 2018-10-14 DIAGNOSIS — Z430 Encounter for attention to tracheostomy: Secondary | ICD-10-CM | POA: Diagnosis not present

## 2018-10-20 ENCOUNTER — Other Ambulatory Visit: Payer: Self-pay | Admitting: *Deleted

## 2018-10-20 ENCOUNTER — Ambulatory Visit: Payer: Self-pay | Admitting: *Deleted

## 2018-10-20 NOTE — Patient Outreach (Signed)
[facility]) Care Management  10/20/2018  [name] [name] 21-Jul-1951 [id number]   Member scheduled for home visit this morning.  Call placed to confirm visit.  This care manager and member agreed to reschedule for next week.  Denies any urgent concerns.  Valente David, [place], MSN [place] [phone]

## 2018-10-26 ENCOUNTER — Other Ambulatory Visit: Payer: Self-pay | Admitting: *Deleted

## 2018-10-26 ENCOUNTER — Encounter: Payer: Self-pay | Admitting: *Deleted
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
## 2018-10-27 DIAGNOSIS — Z9889 Other specified postprocedural states: Secondary | ICD-10-CM | POA: Diagnosis not present

## 2018-10-27 DIAGNOSIS — D141 Benign neoplasm of larynx: Secondary | ICD-10-CM | POA: Diagnosis not present

## 2018-10-27 DIAGNOSIS — Z930 Tracheostomy status: Secondary | ICD-10-CM | POA: Diagnosis not present

## 2018-10-27 DIAGNOSIS — R490 Dysphonia: Secondary | ICD-10-CM | POA: Diagnosis not present

## 2018-10-27 DIAGNOSIS — R0600 Dyspnea, unspecified: Secondary | ICD-10-CM | POA: Diagnosis not present

## 2018-10-27 DIAGNOSIS — J3802 Paralysis of vocal cords and larynx, bilateral: Secondary | ICD-10-CM | POA: Diagnosis not present

## 2018-10-27 DIAGNOSIS — J386 Stenosis of larynx: Secondary | ICD-10-CM | POA: Diagnosis not present

## 2018-11-02 ENCOUNTER — Telehealth: Payer: Self-pay | Admitting: Internal Medicine

## 2018-11-02 NOTE — Telephone Encounter (Signed)
Called patient and left a message for him to call back.

## 2018-11-03 ENCOUNTER — Telehealth: Payer: Self-pay | Admitting: Internal Medicine

## 2018-11-03 DIAGNOSIS — J9601 Acute respiratory failure with hypoxia: Secondary | ICD-10-CM

## 2018-11-03 NOTE — Telephone Encounter (Signed)
Fine with me

## 2018-11-03 NOTE — Telephone Encounter (Signed)
Pt daughter returning call cb# [phone]//kob

## 2018-11-03 NOTE — Telephone Encounter (Signed)
Spoke with Ms. [name] [name], she is requesting a RX for a new compressor ([address]) [place] [employer] large volume nebulizer for his trach because the one he has is not working properly. His insurance would pay for a new one they just need an order. Can we place the order?  Patient Instructions by Tanda Rockers, MD at 08/24/2018 10:45 AM  Author: Tanda Rockers, MD Author Type: Physician Filed: 08/24/2018 10:47 AM  Note Status: Signed Cosign: Cosign Not Required Encounter Date: 08/24/2018  Editor: Tanda Rockers, MD (Physician)    Work on inhaler technique:  relax and gently blow all the way out then take a nice smooth deep breath back in, triggering the inhaler at same time you start breathing in.  Hold for up to 5 seconds if you can. Blow out thru nose. Rinse and gargle with water when done      Please schedule a follow up visit in 3 months but call sooner if needed  with all medications /inhalers/ solutions in hand so we can verify exactly what you are taking. This includes all medications from all doctors and over the counters      Instructions   Work on inhaler technique:  relax and gently blow all the way out then take a nice smooth deep breath back in, triggering the inhaler at same time you start breathing in.  Hold for up to 5 seconds if you can. Blow out thru nose. Rinse and gargle with water when done

## 2018-11-03 NOTE — Telephone Encounter (Signed)
ATC Patient.  Left message to call back. 

## 2018-11-03 NOTE — Telephone Encounter (Signed)
I have placed this order with [facility] per the patient nothing further is needed at this time.

## 2018-11-04 ENCOUNTER — Emergency Department (HOSPITAL_COMMUNITY)
Admission: EM | Admit: 2018-11-04 | Discharge: 2018-11-05 | Disposition: A | Discharge status: Home / Self Care | Payer: Medicare Other | Attending: Emergency Medicine | Admitting: Emergency Medicine

## 2018-11-04 ENCOUNTER — Emergency Department (HOSPITAL_COMMUNITY): Payer: Medicare Other

## 2018-11-04 ENCOUNTER — Encounter (HOSPITAL_COMMUNITY): Payer: Self-pay

## 2018-11-04 DIAGNOSIS — J189 Pneumonia, unspecified organism: Secondary | ICD-10-CM | POA: Insufficient documentation

## 2018-11-04 DIAGNOSIS — I10 Essential (primary) hypertension: Secondary | ICD-10-CM | POA: Diagnosis not present

## 2018-11-04 DIAGNOSIS — J449 Chronic obstructive pulmonary disease, unspecified: Secondary | ICD-10-CM | POA: Diagnosis not present

## 2018-11-04 DIAGNOSIS — Z930 Tracheostomy status: Secondary | ICD-10-CM | POA: Insufficient documentation

## 2018-11-04 DIAGNOSIS — J181 Lobar pneumonia, unspecified organism: Secondary | ICD-10-CM

## 2018-11-04 DIAGNOSIS — Z79899 Other long term (current) drug therapy: Secondary | ICD-10-CM | POA: Diagnosis not present

## 2018-11-04 DIAGNOSIS — R05 Cough: Secondary | ICD-10-CM | POA: Diagnosis not present

## 2018-11-04 DIAGNOSIS — R062 Wheezing: Secondary | ICD-10-CM | POA: Diagnosis not present

## 2018-11-04 DIAGNOSIS — R0602 Shortness of breath: Secondary | ICD-10-CM | POA: Diagnosis not present

## 2018-11-04 DIAGNOSIS — R069 Unspecified abnormalities of breathing: Secondary | ICD-10-CM | POA: Diagnosis not present

## 2018-11-04 NOTE — ED Triage Notes (Signed)
Pt comes via [facility] from home, has a trach, reports nebulizer machine is broken, lots of secrestion and wheezing in all fields, PTA received 10 albuterol, 0.5 atrovent
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
## 2018-11-04 NOTE — Telephone Encounter (Signed)
LMTCB x3 for pt.  

## 2018-11-05 LAB — CBC WITH DIFFERENTIAL/PLATELET
Abs Immature Granulocytes: 0.03 10*3/uL (ref 0.00–0.07)
Basophils Absolute: 0 10*3/uL (ref 0.0–0.1)
Basophils Relative: 1 %
Eosinophils Absolute: 0 10*3/uL (ref 0.0–0.5)
Eosinophils Relative: 0 %
HCT: 38.6 % — ABNORMAL LOW (ref 39.0–52.0)
Hemoglobin: 12.5 g/dL — ABNORMAL LOW (ref 13.0–17.0)
Immature Granulocytes: 0 %
LYMPHS ABS: 2.2 10*3/uL (ref 0.7–4.0)
Lymphocytes Relative: 25 %
MCH: 29.5 pg (ref 26.0–34.0)
MCHC: 32.4 g/dL (ref 30.0–36.0)
MCV: 91 fL (ref 80.0–100.0)
Monocytes Absolute: 1 10*3/uL (ref 0.1–1.0)
Monocytes Relative: 11 %
Neutro Abs: 5.5 10*3/uL (ref 1.7–7.7)
Neutrophils Relative %: 63 %
Platelets: 280 10*3/uL (ref 150–400)
RBC: 4.24 MIL/uL (ref 4.22–5.81)
RDW: 14.1 % (ref 11.5–15.5)
WBC: 8.8 10*3/uL (ref 4.0–10.5)
nRBC: 0 % (ref 0.0–0.2)

## 2018-11-05 LAB — BASIC METABOLIC PANEL
Anion gap: 10 (ref 5–15)
BUN: 18 mg/dL (ref 8–23)
CO2: 23 mmol/L (ref 22–32)
Calcium: 8.9 mg/dL (ref 8.9–10.3)
Chloride: 102 mmol/L (ref 98–111)
Creatinine, Ser: 0.72 mg/dL (ref 0.61–1.24)
GFR calc non Af Amer: >60 mL/min (ref >60)
Glucose, Bld: 97 mg/dL (ref 70–99)
Potassium: 3.8 mmol/L (ref 3.5–5.1)
Sodium: 135 mmol/L (ref 135–145)

## 2018-11-05 MED ORDER — DOXYCYCLINE HYCLATE 100 MG PO CAPS: 100.0000 mg | ORAL_CAPSULE | Freq: Two times a day (BID) | ORAL | 0 refills | Status: AC

## 2018-11-05 MED ORDER — DOXYCYCLINE HYCLATE 100 MG PO TABS
100.0000 mg | ORAL_TABLET | Freq: Once | ORAL | Status: AC
  Administered 2018-11-05: 100 mg via ORAL
  Filled 2018-11-05: qty 1

## 2018-11-07 ENCOUNTER — Telehealth: Payer: Self-pay | Admitting: Internal Medicine

## 2018-11-07 DIAGNOSIS — J3802 Paralysis of vocal cords and larynx, bilateral: Secondary | ICD-10-CM

## 2018-11-07 NOTE — Telephone Encounter (Signed)
Left message for patient to call back.   Will close this message since this was the 4th message.

## 2018-11-07 NOTE — Telephone Encounter (Signed)
This order has been placed nothing further is needed at this time. Daughter is aware.

## 2018-11-07 NOTE — Telephone Encounter (Signed)
Fine with me but this should have been thru [facility] where his trach care is being coordinated. Needs ov with me - be sure keeps appt 3/12 with all meds in hand

## 2018-11-07 NOTE — Telephone Encounter (Signed)
Received this msg from [place]--  Sallee Lange sent to Rosana Berger, [place]!   Could you get me an order for this patient for a [place] [phone]   After talking with his daughter today this is what they are needing a not a neb.   Thank You!    Dr Melvyn Novas please advise if this is okay to order, thanks!

## 2018-11-09 DIAGNOSIS — Z930 Tracheostomy status: Secondary | ICD-10-CM | POA: Diagnosis not present

## 2018-11-09 DIAGNOSIS — R0600 Dyspnea, unspecified: Secondary | ICD-10-CM | POA: Diagnosis not present

## 2018-11-09 DIAGNOSIS — D141 Benign neoplasm of larynx: Secondary | ICD-10-CM | POA: Diagnosis not present

## 2018-11-09 DIAGNOSIS — J386 Stenosis of larynx: Secondary | ICD-10-CM | POA: Diagnosis not present

## 2018-11-09 DIAGNOSIS — Z9889 Other specified postprocedural states: Secondary | ICD-10-CM | POA: Diagnosis not present

## 2018-11-09 DIAGNOSIS — J3802 Paralysis of vocal cords and larynx, bilateral: Secondary | ICD-10-CM | POA: Diagnosis not present

## 2018-11-14 DIAGNOSIS — Z430 Encounter for attention to tracheostomy: Secondary | ICD-10-CM | POA: Diagnosis not present

## 2018-11-14 DIAGNOSIS — Z930 Tracheostomy status: Secondary | ICD-10-CM | POA: Diagnosis not present

## 2018-11-16 ENCOUNTER — Telehealth: Payer: Self-pay | Admitting: Internal Medicine

## 2018-11-16 NOTE — Telephone Encounter (Signed)
Spoke to [employer] at [employer].  She states she spoke to pt's dtr, [name], on 2/26 and spoke to pt on 2/27.  They were waiting on info and insurance verification but they did get everything and compressor has been ordered.  She states should be in at anytime and they will get it to pt as soon as it comes in.  I spoke to pt and made him aware.

## 2018-11-16 NOTE — Telephone Encounter (Signed)
When calling patient to reschedule his 11/24/18 appt. Patient asked about his new compressor machine for large volume nebulizer for his trach. He says his machine is not working and it has been over a week since he was told the machine was ordered.   I will route to PCCs to follow up on this order. DME is [facility].  Also Dr. Melvyn Novas patient states he is starting to build up mucus and sometimes breathing is harder. I told patient with any difficulty breathing or mucus clogging trach patient needed to go to emergency room. Patient verbalized understanding. But more concerned about the status of the machine. Will route to Dr. Melvyn Novas as [name] [name] or if he has any further recommendations

## 2018-11-16 NOTE — Telephone Encounter (Signed)
Called [facility].  Closed for lunch from 12-1.  Left vm for a call back after lunch.

## 2018-11-24 ENCOUNTER — Encounter: Payer: Self-pay | Admitting: General Surgery

## 2018-11-24 ENCOUNTER — Encounter: Payer: Self-pay | Admitting: Nurse Practitioner

## 2018-11-24 ENCOUNTER — Other Ambulatory Visit: Payer: Self-pay

## 2018-11-24 ENCOUNTER — Ambulatory Visit (INDEPENDENT_AMBULATORY_CARE_PROVIDER_SITE_OTHER): Payer: Medicare Other | Admitting: Nurse Practitioner

## 2018-11-24 VITALS — BP 130/70 | HR 89 | Ht 65.0 in | Wt 154.4 lb

## 2018-11-24 DIAGNOSIS — J449 Chronic obstructive pulmonary disease, unspecified: Secondary | ICD-10-CM | POA: Diagnosis not present

## 2018-11-24 DIAGNOSIS — R05 Cough: Secondary | ICD-10-CM | POA: Diagnosis not present

## 2018-11-24 DIAGNOSIS — R058 Other specified cough: Secondary | ICD-10-CM

## 2018-11-24 NOTE — Patient Instructions (Signed)
Glad to see you are doing well Continue Symbicort 2 puffs twice daily Continue Proventil as needed Continue to work on inhaler technique as discussed at last visit with Dr. Melvyn Novas.  Follow up: Follow-up with Dr. Melvyn Novas in 3 months or sooner if needed. Please bring medications to visit with you at next visit.

## 2018-11-24 NOTE — Progress Notes (Signed)
@Patient  ID: [name] [name], male    DOB: 1950/12/30, 68 y.o.   MRN: [id number]  Chief Complaint  Patient presents with  . Follow-up    No issues with COPD, medications working well for him.    Referring provider: Antony Contras, MD  HPI 67 year old male illiterate never regular cig smoker/ MJ only  with cough and sob x 2015 with care [facility] by PCP and pulmonary / no better on rx so self referred to pulmonary clinic 08/31/2016 with spirometry showing insp truncation/ minimal airflow obst 10/30/2016 p trach for trauma 10/09/15   Recent tests/events:  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 > referred to [facility] for second opinion> trach December 16 2016 > Trach surgery 03/15/17   / trach out end of summer /early fall 2018  10/27/17 ENT f/u "Ok" airway  - 01/14/2018 PFT f/u loop C/w mod severe truncation of insp loop > rx gerd and referred back to [facility]>  Re trach 03/14/18  LAST OV Dr. Melvyn Novas 08/25/19: Breathing is doing well today. He is using his albuterol inhaler once daily on average and he rarely uses neb.   Dyspnea:  Walking to store fine now but has to walk slowly and leave pmv off  Cough: minimal white  Sleeping: with pmv on ok  SABA use: usually prior to walk not really sure he needs it now / hardly ever neb now  02: none  No obvious day to day or daytime variability or assoc excess/ purulent sputum or mucus plugs or hemoptysis or cp or chest tightness, subjective wheeze or overt sinus or hb symptoms.   Sleeping flare as above without nocturnal  or early am exacerbation  of respiratory  c/o's or need for noct saba. Also denies any obvious fluctuation of symptoms with weather or environmental changes or other aggravating or alleviating factors except as outlined  above  Plan: Work on inhaler technique:  relax and gently blow all the way out then take a nice smooth deep breath back in, triggering the inhaler at same time you start breathing in.  Hold for up to 5 seconds if you can. Blow out thru nose. Rinse and gargle with water when done  OV 11/24/18 - follow up [facility] today for follow-up visit.  He states that his breathing has been doing well.  Has been a stable interval for patient.  Patient is scheduled to go to [facility] on 15 April to get his trach capped off and hopefully removed at some point.  He is compliant with Symbicort.  He only uses Proventil once per week.  Rarely ever uses his nebulizer. No obvious day to day or daytime variability or assoc excess/ purulent sputum or mucus plugs or hemoptysis or cp or chest tightness, subjective wheeze or overt sinus or hb symptoms. Denies f/c/s, n/v/d, hemoptysis, PND, leg swelling.     No Known Allergies  Immunization History  Administered Date(s) Administered  . Influenza, High Dose Seasonal PF 06/14/2017  . Influenza,inj,Quad PF,6+ Mos 05/12/2015, 10/20/2016  . Influenza-Unspecified 05/12/2015, 10/20/2016  . Pneumococcal Polysaccharide-23 03/10/2016, 05/06/2018  . Tdap 09/18/2015    Past Medical History:  Diagnosis Date  . Abnormal EKG 12/24/11   anteroseptal and lateral  ST elevation, felt r/t early repolarization;  Cardiac cath 12/24/11 - normal coronary anatomy, EF 55-65%  . Anxiety   . Arthritis   . Asthma   . Bradycardia, sinus 12/24/11  . COPD (chronic obstructive pulmonary disease) ([place])   . Dyspnea   . H/O tracheostomy   . History of alcohol abuse    hospitalized for detox 2002  . HTN (hypertension)   . Hypotension 12/24/11   in the setting of dehydration   . Marijuana use   . Pneumonia   . Syncope and collapse 12/24/11   2/2 hypotension in the setting of dehydration  . Upper airway cough syndrome     Tobacco History: Social History   Tobacco Use  Smoking Status Never  Smoker  Smokeless Tobacco Never Used   Counseling given: Not Answered   Outpatient Encounter Medications as of 11/24/2018  Medication Sig  . albuterol (PROVENTIL HFA;VENTOLIN HFA) 108 (90 Base) MCG/ACT inhaler Inhale 2 puffs into the lungs every 6 (six) hours as needed for wheezing or shortness of breath.  Marland Kitchen albuterol (PROVENTIL) (2.5 MG/3ML) 0.083% nebulizer solution Take 3-6 mLs (2.5-5 mg total) by nebulization every 4 (four) hours as needed for wheezing or shortness of breath.  . budesonide-formoterol (SYMBICORT) 160-4.5 MCG/ACT inhaler Take 2 puffs first thing in am and then another 2 puffs about 12 hours later. (Patient taking differently: Inhale 2 puffs into the lungs 2 (two) times daily. )  . cetirizine (ZYRTEC) 10 MG tablet Take 10 mg by mouth daily as needed for allergies.   Marland Kitchen dextromethorphan (DELSYM) 30 MG/5ML liquid Take 10 mLs by mouth every 12 (twelve) hours as needed for cough.  . famotidine (PEPCID) 20 MG tablet TAKE 1 TABLET BY MOUTH AT BEDTIME (Patient taking differently: Take 20 mg by mouth daily. )  . finasteride (PROSCAR) 5 MG tablet Take 5 mg by mouth daily.   . fluticasone (FLONASE) 50 MCG/ACT nasal spray Place 2 sprays into both nostrils daily as needed for allergies.   . hydrochlorothiazide (HYDRODIURIL) 25 MG tablet Take 1 tablet (25 mg total) by mouth daily.  Marland Kitchen losartan (COZAAR) 25 MG tablet Take 25 mg by mouth daily.   . Multiple Vitamin (MULTIVITAMIN) tablet Take 1 tablet by mouth daily.  . pantoprazole (PROTONIX) 40 MG tablet Take 1 tablet (40 mg total) by mouth daily. Take 30-60 min before first meal of the day  . valsartan-hydrochlorothiazide (DIOVAN-HCT) 80-12.5 MG tablet TAKE 1 TABLET BY MOUTH DAILY   No facility-administered encounter medications on file as of 11/24/2018.      Review of Systems  Review of Systems  Constitutional: Negative.  Negative for chills and fever.  HENT: Negative.   Respiratory: Negative for cough, shortness of breath and  wheezing.   Cardiovascular: Negative.  Negative for chest pain, palpitations and leg swelling.  Gastrointestinal: Negative.   Allergic/Immunologic: Negative.   Neurological: Negative.   Psychiatric/Behavioral: Negative.        Physical Exam  BP 130/70 (BP Location: Left Arm, Patient Position: Sitting, Cuff Size: Normal)   Pulse 89   Ht 5\' 5"  (1.651 m)   Wt 154 lb 6.4 oz (70 kg)   SpO2 96%   BMI 25.69 kg/m   Wt Readings from Last 5 Encounters:  11/24/18 154 lb 6.4 oz (70 kg)  10/26/18 150 lb (68 kg)  09/07/18 155 lb 6.8 oz (70.5 kg)  08/24/18 155 lb 6.4 oz (70.5 kg)  07/27/18 153 lb (69.4 kg)     Physical Exam Vitals signs  and nursing note reviewed.  Constitutional:      General: He is not in acute distress.    Appearance: He is well-developed.  Neck:     Trachea: Tracheostomy present.  Cardiovascular:     Rate and Rhythm: Normal rate and regular rhythm.  Pulmonary:     Effort: Pulmonary effort is normal. No respiratory distress.     Breath sounds: Normal breath sounds. No wheezing or rhonchi.  Musculoskeletal:        General: No swelling.  Skin:    General: Skin is warm and dry.  Neurological:     Mental Status: He is alert and oriented to person, place, and time.       Assessment & Plan:   COPD (chronic obstructive pulmonary disease) ([place]) Continue symbicort Patient Instructions  Glad to see you are doing well Continue Symbicort 2 puffs twice daily Continue Proventil as needed Continue to work on inhaler technique as discussed at last visit with Dr. Melvyn Novas.  Follow up: Follow-up with Dr. Melvyn Novas in 3 months or sooner if needed. Please bring medications to visit with you at next visit.    Upper airway cough syndrome/ bilateral VC paralysis causing VCD Patient is doing really well. Patient is scheduled to go to [facility] on 15 April to get his trach capped off and hopefully removed at some point.   Patient Instructions  Glad to see you are doing well  Continue Symbicort 2 puffs twice daily Continue Proventil as needed Continue to work on inhaler technique as discussed at last visit with Dr. Melvyn Novas.  Follow up: Follow-up with Dr. Melvyn Novas in 3 months or sooner if needed. Please bring medications to visit with you at next visit.       Fenton Foy, NP 11/25/2018

## 2018-11-25 ENCOUNTER — Encounter: Payer: Self-pay | Admitting: Nurse Practitioner

## 2018-11-25 NOTE — Assessment & Plan Note (Signed)
Continue symbicort Patient Instructions  Glad to see you are doing well Continue Symbicort 2 puffs twice daily Continue Proventil as needed Continue to work on inhaler technique as discussed at last visit with Dr. Melvyn Novas.  Follow up: Follow-up with Dr. Melvyn Novas in 3 months or sooner if needed. Please bring medications to visit with you at next visit.

## 2018-11-25 NOTE — Assessment & Plan Note (Signed)
Patient is doing really well. Patient is scheduled to go to [facility] on 15 April to get his trach capped off and hopefully removed at some point.   Patient Instructions  Glad to see you are doing well Continue Symbicort 2 puffs twice daily Continue Proventil as needed Continue to work on inhaler technique as discussed at last visit with Dr. Melvyn Novas.  Follow up: Follow-up with Dr. Melvyn Novas in 3 months or sooner if needed. Please bring medications to visit with you at next visit.

## 2018-11-28 DIAGNOSIS — I10 Essential (primary) hypertension: Secondary | ICD-10-CM | POA: Diagnosis not present

## 2018-11-28 DIAGNOSIS — R7303 Prediabetes: Secondary | ICD-10-CM | POA: Diagnosis not present

## 2018-11-28 DIAGNOSIS — R3129 Other microscopic hematuria: Secondary | ICD-10-CM | POA: Diagnosis not present

## 2018-11-28 DIAGNOSIS — R8290 Unspecified abnormal findings in urine: Secondary | ICD-10-CM | POA: Diagnosis not present

## 2018-11-28 IMAGING — CR DG CHEST 2V
2 series · 2 of 2 positions shown · non-contrast
Comparison: 10/21/2016

CLINICAL DATA: Shortness of breath

EXAM:
CHEST  2 VIEW

[chest pa]
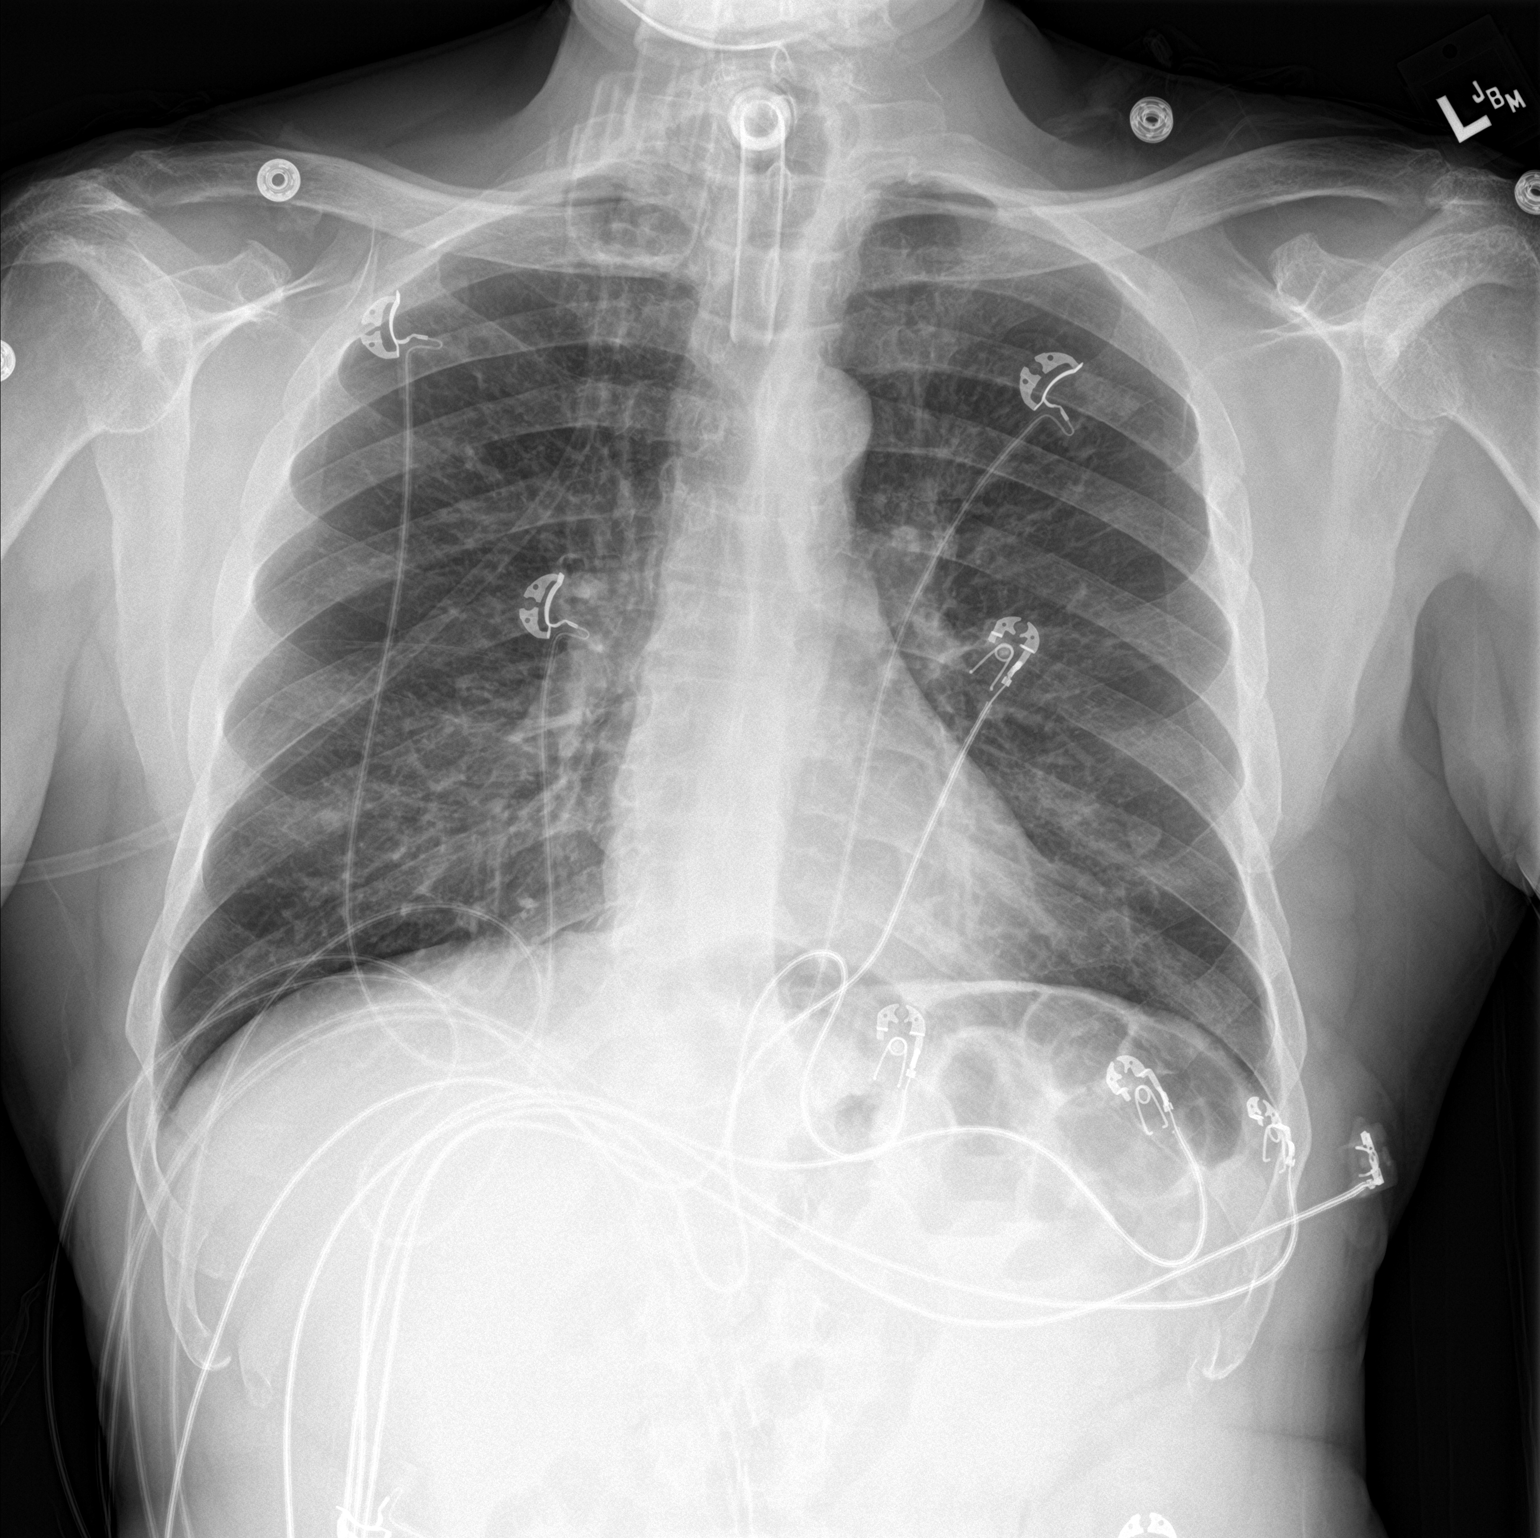

[chest lat]
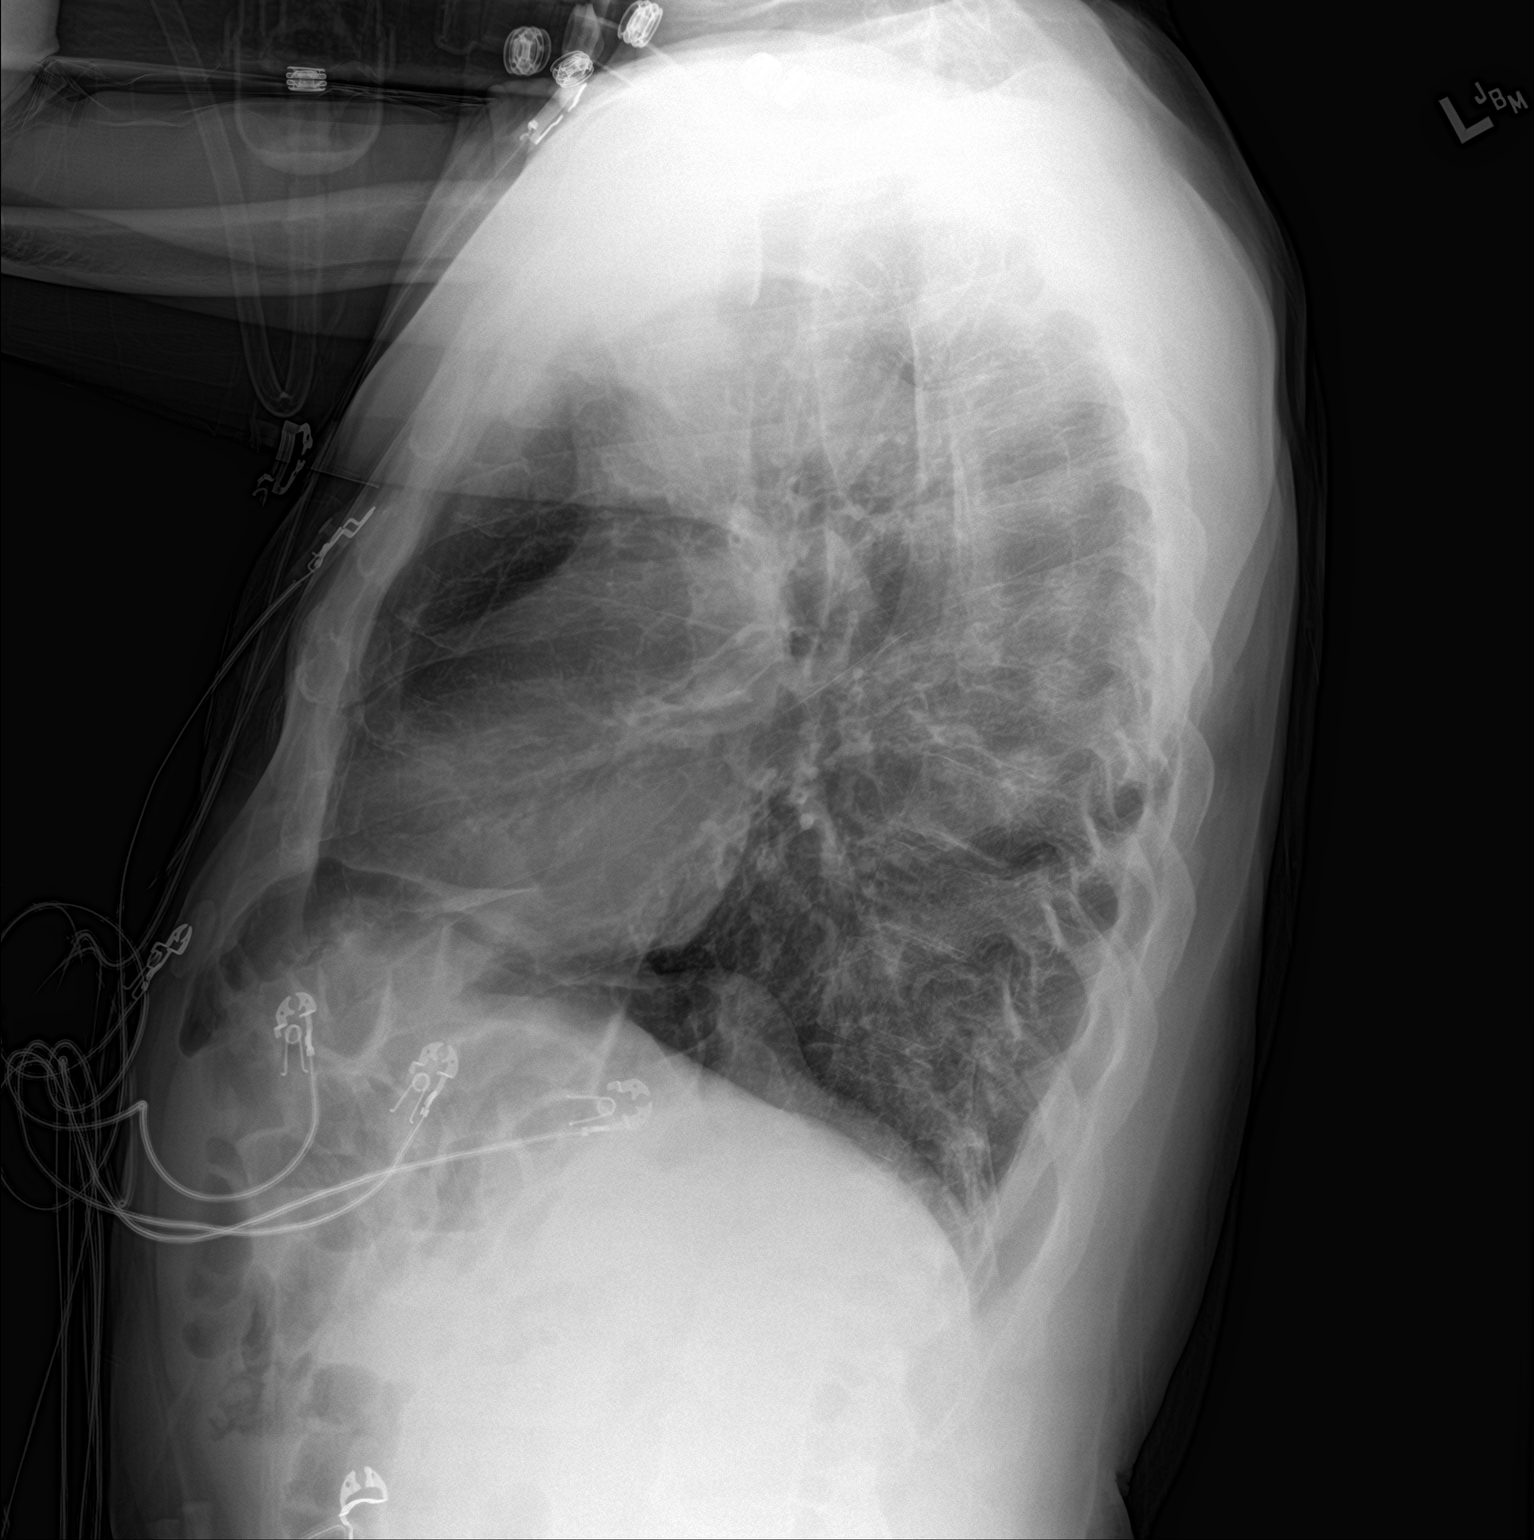

[2 of 2 positions shown; findings below may reference images not displayed]

FINDINGS: Tracheostomy in the mid trachea. Normal heart size and vascularity.
Nipple shadows overlie the chest. Lungs remain clear. No focal
pneumonia negative for edema, effusion or pneumothorax.
Atherosclerosis of aorta. Monitor leads overlie the abdomen and
chest. No acute osseous finding.
IMPRESSION: Interval tracheostomy in good position.

No superimposed acute chest process.

## 2018-11-29 ENCOUNTER — Emergency Department (HOSPITAL_COMMUNITY)
Admission: EM | Admit: 2018-11-29 | Discharge: 2018-11-29 | Disposition: A | Discharge status: Home / Self Care | Payer: Medicare Other | Attending: Emergency Medicine | Admitting: Emergency Medicine

## 2018-11-29 ENCOUNTER — Encounter (HOSPITAL_COMMUNITY): Payer: Self-pay | Admitting: Emergency Medicine

## 2018-11-29 ENCOUNTER — Other Ambulatory Visit: Payer: Self-pay

## 2018-11-29 ENCOUNTER — Emergency Department (HOSPITAL_COMMUNITY): Payer: Medicare Other

## 2018-11-29 DIAGNOSIS — K117 Disturbances of salivary secretion: Secondary | ICD-10-CM

## 2018-11-29 DIAGNOSIS — Z79899 Other long term (current) drug therapy: Secondary | ICD-10-CM | POA: Insufficient documentation

## 2018-11-29 DIAGNOSIS — I10 Essential (primary) hypertension: Secondary | ICD-10-CM | POA: Insufficient documentation

## 2018-11-29 DIAGNOSIS — R0989 Other specified symptoms and signs involving the circulatory and respiratory systems: Secondary | ICD-10-CM | POA: Diagnosis not present

## 2018-11-29 DIAGNOSIS — R0602 Shortness of breath: Secondary | ICD-10-CM | POA: Diagnosis not present

## 2018-11-29 DIAGNOSIS — J449 Chronic obstructive pulmonary disease, unspecified: Secondary | ICD-10-CM | POA: Diagnosis not present

## 2018-11-29 DIAGNOSIS — R05 Cough: Secondary | ICD-10-CM | POA: Diagnosis not present
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
## 2018-11-29 NOTE — ED Triage Notes (Signed)
Patient from home, woke up this morning having a hard time breathing and feeling like his trach is clogged.  Patient has been having a lot of problems with this in the last month.  He was diagnosed with PNA one month ago.  Patient does have rhonchi bilaterally per EMS.  124/86, 76, 99RA, cbg of 102.  #6 shiley.

## 2018-11-29 NOTE — ED Notes (Signed)
Patient transported to X-ray 

## 2018-12-01 ENCOUNTER — Other Ambulatory Visit: Payer: Self-pay | Admitting: *Deleted

## 2018-12-01 ENCOUNTER — Telehealth: Payer: Self-pay | Admitting: Internal Medicine

## 2018-12-01 NOTE — Telephone Encounter (Signed)
Patient came to office this afternoon requesting information about nebulizer order from Dr Melvyn Novas.  Patient stated he was unable to call office.  Correct office number given. Per 11/03/18,  Dr Melvyn Novas, new order for new compressor (drive) [place] [employer] large volume nebulizer for his trach. Patient stated he has not heard anything from [facility].   11/24/18 note from [place], [place] [phone]) to ask about order placed on 11/05/18 for drive compressor model # [id number]. Was advised the model # on the order received was not available. They are contacting their [place] office to determine when they can get the patient the machine needed. I advsed this order was placed last month and the patient needs it ASAP.  Called and spoke with [place] Caster.  Christina stated Lovena Le and Jeneen Rinks were working on the order.  Margreta Journey spoke with Jeneen Rinks.  Margreta Journey stated nebulizer was ordered and  insurance approval was reached today. Order is going to be handled by [facility] office.  Christina stated [facility] will reach out to Patient this afternoon.  Called and spoke with Patient. [facility] nebulizer update given.  Understanding given.  Nothing further at this time.

## 2018-12-01 NOTE — Progress Notes (Signed)
Chart and office note reviewed in detail  > agree with a/p as outlined    

## 2018-12-01 NOTE — Patient Outreach (Signed)
[facility]) Care Management  12/01/2018  Yuuki Skeens [birth year] [phone]   Call placed to member to follow up on current condition and status of getting trach capped.  He report that when he went for his appointment where he was scheduled to get trach capped he was still on antibiotics for pneumonia.  State the decision was made to wait until this course was complete and he was clear of pneumonia. He is scheduled again for next month to consider capping in effort to remove tracheostomy tube.  This care manager discussed his plan to decrease risk of contracting Covid-19, state he is practicing social distancing and proper hand hygiene.  Denies any concerns at this time, advised to contact with questions.  Will follow up within the next month.  If trach removed and he remains stable, will consider transition to health coach.  Valente David, [place], MSN [place] [phone]

## 2018-12-05 DIAGNOSIS — J3800 Paralysis of vocal cords and larynx, unspecified: Secondary | ICD-10-CM | POA: Diagnosis not present

## 2018-12-05 DIAGNOSIS — R0982 Postnasal drip: Secondary | ICD-10-CM | POA: Diagnosis not present

## 2018-12-05 DIAGNOSIS — J449 Chronic obstructive pulmonary disease, unspecified: Secondary | ICD-10-CM | POA: Diagnosis not present

## 2018-12-12 ENCOUNTER — Telehealth: Payer: Self-pay | Admitting: Internal Medicine

## 2018-12-12 NOTE — Telephone Encounter (Signed)
Dr. Wert please advise.  Thanks! 

## 2018-12-12 NOTE — Telephone Encounter (Signed)
He should be on symbicort  Ok to use spiriva respimat too but it's not as important as the symbicort  No ok to use the handihaler as will aggravate his upper airway

## 2018-12-12 NOTE — Telephone Encounter (Signed)
ATC pt, no answer. Left message for pt to call back.  

## 2018-12-12 NOTE — Telephone Encounter (Signed)
Patient came to the office this morning with a letter he received from the [place]. Patient stated he takes Symbicort, but the [place] is requesting Spiriva respimat.   [facility] Sp[facility] Handihaler to be switch to Spiriva respimat. Patient is wanting to know if Dr Melvyn Novas, is ok with the change. Copy of letter placed in MW's box   Tonya NP, 11/24/18 OV    Continue symbicort Patient Instructions  Glad to see you are doing well Continue Symbicort 2 puffs twice daily Continue Proventil as needed Continue to work on inhaler technique as discussed at last visit with Dr. Melvyn Novas.  Follow up: Follow-up with Dr. Melvyn Novas in 3 months or sooner if needed. Please bring medications to visit with you at next visit.        Message routed to triage for Dr Melvyn Novas

## 2018-12-13 ENCOUNTER — Telehealth: Payer: Self-pay | Admitting: Internal Medicine

## 2018-12-13 ENCOUNTER — Emergency Department (HOSPITAL_COMMUNITY)
Admission: EM | Admit: 2018-12-13 | Discharge: 2018-12-13 | Disposition: A | Discharge status: Home / Self Care | Payer: Medicare Other | Attending: Emergency Medicine | Admitting: Emergency Medicine

## 2018-12-13 ENCOUNTER — Emergency Department (HOSPITAL_COMMUNITY): Payer: Medicare Other

## 2018-12-13 ENCOUNTER — Other Ambulatory Visit: Payer: Self-pay

## 2018-12-13 DIAGNOSIS — M546 Pain in thoracic spine: Secondary | ICD-10-CM | POA: Diagnosis not present

## 2018-12-13 DIAGNOSIS — M5489 Other dorsalgia: Secondary | ICD-10-CM | POA: Diagnosis not present

## 2018-12-13 DIAGNOSIS — T17500A Unspecified foreign body in bronchus causing asphyxiation, initial encounter: Secondary | ICD-10-CM

## 2018-12-13 DIAGNOSIS — J449 Chronic obstructive pulmonary disease, unspecified: Secondary | ICD-10-CM | POA: Insufficient documentation

## 2018-12-13 DIAGNOSIS — Z79899 Other long term (current) drug therapy: Secondary | ICD-10-CM | POA: Insufficient documentation

## 2018-12-13 DIAGNOSIS — I10 Essential (primary) hypertension: Secondary | ICD-10-CM | POA: Diagnosis not present

## 2018-12-13 DIAGNOSIS — I213 ST elevation (STEMI) myocardial infarction of unspecified site: Secondary | ICD-10-CM | POA: Diagnosis not present

## 2018-12-13 DIAGNOSIS — J9809 Other diseases of bronchus, not elsewhere classified: Secondary | ICD-10-CM

## 2018-12-13 DIAGNOSIS — R079 Chest pain, unspecified: Secondary | ICD-10-CM | POA: Diagnosis not present

## 2018-12-13 DIAGNOSIS — R05 Cough: Secondary | ICD-10-CM | POA: Diagnosis not present

## 2018-12-13 LAB — CBC
HCT: 39.8 % (ref 39.0–52.0)
Hemoglobin: 13.6 g/dL (ref 13.0–17.0)
MCH: 30.6 pg (ref 26.0–34.0)
MCHC: 34.2 g/dL (ref 30.0–36.0)
MCV: 89.4 fL (ref 80.0–100.0)
Platelets: 330 10*3/uL (ref 150–400)
RBC: 4.45 MIL/uL (ref 4.22–5.81)
RDW: 13.2 % (ref 11.5–15.5)
WBC: 6.8 10*3/uL (ref 4.0–10.5)
nRBC: 0 % (ref 0.0–0.2)

## 2018-12-13 LAB — BASIC METABOLIC PANEL
Anion gap: 8 (ref 5–15)
BUN: 15 mg/dL (ref 8–23)
CO2: 24 mmol/L (ref 22–32)
Calcium: 9.3 mg/dL (ref 8.9–10.3)
Chloride: 102 mmol/L (ref 98–111)
Creatinine, Ser: 0.87 mg/dL (ref 0.61–1.24)
GFR calc Af Amer: >60 mL/min (ref >60)
GFR calc non Af Amer: >60 mL/min (ref >60)
GLUCOSE: 107 mg/dL — AB (ref 70–99)
Potassium: 3.7 mmol/L (ref 3.5–5.1)
Sodium: 134 mmol/L — ABNORMAL LOW (ref 135–145)

## 2018-12-13 LAB — TROPONIN I: Troponin I: <0.03 ng/mL (ref <0.03)

## 2018-12-13 MED ORDER — SODIUM CHLORIDE 0.9% FLUSH
3.0000 mL | Freq: Once | INTRAVENOUS | Status: AC
  Administered 2018-12-13: 3 mL via INTRAVENOUS

## 2018-12-13 MED ORDER — ACETAMINOPHEN 500 MG PO TABS
1000.0000 mg | ORAL_TABLET | Freq: Once | ORAL | Status: AC
  Administered 2018-12-13: 1000 mg via ORAL
  Filled 2018-12-13: qty 2

## 2018-12-13 MED ORDER — IOHEXOL 300 MG/ML  SOLN
75.0000 mL | Freq: Once | INTRAMUSCULAR | Status: AC | PRN
  Administered 2018-12-13: 75 mL via INTRAVENOUS

## 2018-12-13 MED ORDER — OXYCODONE-ACETAMINOPHEN 5-325 MG PO TABS: 1.0000 | ORAL_TABLET | Freq: Three times a day (TID) | ORAL | 0 refills | Status: DC | PRN
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
## 2018-12-13 NOTE — Telephone Encounter (Signed)
Discussed with daughter p review of ct and er note  Dx RML syndrome  Try mucinex 1200 mg every 12 hours and as needed percocet/ heating pad   Assured by review of cxr no relation between rml atx and CP which is not pleuritic  If not improving advised  will need ov with cxr in 2 weeks

## 2018-12-13 NOTE — Telephone Encounter (Signed)
Called and spoke with pt's daughter [name] who stated that pt's rt lung was partially collapsed and the hospital is wanting to know if MW can do an office procedure to help with this.  While in the ED, it stated that the clinical impression was mucus plugging of bronchi. Pt had back pain which is why they went to the ED to be further evaluated and had a cxr of chest and thoracic spine performed as well as a CT w/ contrast.  [name] is not sure who she had spoken to last at the hospital but she did state that they were wondering if MW would be willing to perform the procedure on pt in office to keep him out of the hospital with all that is currently going around. Dr. Melvyn Novas, please advise on this for pt and his daughter as well as the ED staff. Thanks!

## 2018-12-13 NOTE — ED Notes (Signed)
Patient transported to X-ray 

## 2018-12-13 NOTE — ED Notes (Signed)
Patient transported to CT 

## 2018-12-13 NOTE — Discharge Instructions (Addendum)
Thank you for allowing me to care for you today in the Emergency Department.   Call to schedule a follow-up appointment with your lung doctor/pulmonologist.  Continue to use all of your home medications as prescribed.   For pain, you can take 650 mg of Tylenol once every 6 hours or for severe, uncontrollable pain, you can take 1 Percocet every 8 hours.  This medication is a narcotic.  It can also be addicting.  You should only use it if necessary. Do not drive or work while taking this medication because it can make you impaired.  Return to the ER if you develop respiratory distress, severe shortness of breath, chest pain with sweating, high fever, or cough with thick sputum, new numbness or weakness, if you pass out, develop blue lips or extremities, or other new, concerning symptoms.

## 2018-12-13 NOTE — ED Triage Notes (Signed)
Per ems pt was at home and has been having chest pain with cough that radiates to his back for 1 week 1/2. No fevers. No chills. Pt is alert oriented x 4. 115/75, 96% ra, was given 324 aspirin by ems

## 2018-12-15 ENCOUNTER — Telehealth: Payer: Self-pay | Admitting: Internal Medicine

## 2018-12-15 MED ORDER — OXYCODONE-ACETAMINOPHEN 5-325 MG PO TABS: 1.0000 | ORAL_TABLET | Freq: Three times a day (TID) | ORAL | 0 refills | Status: DC | PRN

## 2018-12-15 NOTE — Telephone Encounter (Signed)
Called and spoke with patient aware and verbalized understanding. Nothing further needed.

## 2018-12-15 NOTE — Telephone Encounter (Signed)
Hold off on spiriva and only add it if breathing getting worse  Ok to refill percocet #30 only no refills

## 2018-12-15 NOTE — Telephone Encounter (Signed)
Called pt and advised message from the provider. Pt understood and verbalized understanding. Dr. Melvyn Novas please send in Percocet for pt, I am not able to send this in.

## 2018-12-15 NOTE — Telephone Encounter (Signed)
Done

## 2018-12-15 NOTE — Telephone Encounter (Signed)
Instructions from last OV with [facility] 11/24/2018 Return in about 3 months (around 02/24/2019) for follow up Dr. Melvyn Novas.  Glad to see you are doing well Continue Symbicort 2 puffs twice daily Continue Proventil as needed Continue to work on inhaler technique as discussed at last visit with Dr. Melvyn Novas.  Follow up: Follow-up with Dr. Melvyn Novas in 3 months or sooner if needed. Please bring medications to visit with you at next visit.     Looked back at the ED visit and [name] [name] was discharged home with oxycodone-acetaminophen (percocet) 5-325mg  tablet, #8 tabs with 0 RF by the ED doc.  Called and spoke with [name] [name]'s daughter [name] [name] who stated [name] [name] received Spiriva Respimat inhaler in the mail from the [place] and [name] [name] is wanting to know if MW is fine with [name] [name] taking this inhaler. Looked at [name] [name]'s last OVs and also most recent from [place] and in the above instructions, there is nothing stated about [name] [name] being on Spiriva Respimat.  Dr. Melvyn Novas, please advise if you are fine with [name] [name] beginning Spiriva Respimat. Also, [name] [name] and daughter [name] [name] want to know if [name] [name] can receive more of oxycodone-acetaminophen to help with his pain. This med was prescribed for him by ED at discharge.

## 2018-12-15 NOTE — Telephone Encounter (Signed)
Pt's daughter [name] is calling back [phone] Please leave a detailed message

## 2018-12-15 NOTE — Telephone Encounter (Signed)
lmom 

## 2018-12-20 DIAGNOSIS — R350 Frequency of micturition: Secondary | ICD-10-CM | POA: Diagnosis not present

## 2018-12-20 DIAGNOSIS — R3121 Asymptomatic microscopic hematuria: Secondary | ICD-10-CM | POA: Diagnosis not present

## 2018-12-21 DIAGNOSIS — Z430 Encounter for attention to tracheostomy: Secondary | ICD-10-CM | POA: Diagnosis not present

## 2018-12-21 DIAGNOSIS — Z930 Tracheostomy status: Secondary | ICD-10-CM | POA: Diagnosis not present

## 2018-12-22 DIAGNOSIS — B351 Tinea unguium: Secondary | ICD-10-CM | POA: Diagnosis not present

## 2018-12-22 DIAGNOSIS — L608 Other nail disorders: Secondary | ICD-10-CM | POA: Diagnosis not present

## 2018-12-22 DIAGNOSIS — L603 Nail dystrophy: Secondary | ICD-10-CM | POA: Diagnosis not present

## 2018-12-29 ENCOUNTER — Other Ambulatory Visit: Payer: Self-pay | Admitting: *Deleted

## 2018-12-29 NOTE — Patient Outreach (Signed)
[facility]) Care Management  12/29/2018  [name] [name] 1951/06/10 [id number]   Call placed to member to follow up on current medical status.  He report he is doing well, denies any shortness of breath or any increased mucus production.  State he is using precautions when going out, has family provide resources as much as possible.  Appointment for re-evaluation of capping and removal of trach, rescheduled for early June.  Denies any urgent concerns at this time.  Advised to contact with questions, will follow up within the next 4-6 weeks.  [facility] CM Care Plan Problem One     Most Recent Value  Care Plan Problem One  Risk for ED visit/hospitalization related to COPD/compromised airway as evidenced by high ED utilization  Role Documenting the Problem One  Care Management [place] for Problem One  Active  [facility] Long Term Goal   Member will not present to ED or be hospitalized within the next 45 days  [facility] Long Term Goal Start Date  12/29/18 [facility] reset, multiple ED visits]  Inter[facility] for Problem One Long Term Goal  Educated on use of inhalers and nebulizers in effort to decrease risk of mucus plug.  [facility] CM Short Term Goal #1   Member will report being decannulated within the next 4 weeks  [facility] CM Short Term Goal #1 Start Date  10/26/18 [Not met]  [facility] CM Short Term Goal #2   Member will report taking all medications as prescribed over the next 4 weeks  [facility]N CM Short Term Goal #2 Start Date  10/26/18  [facility] CM Short Term Goal #2 Met Date  12/29/18       [name] David, RN, MSN [place] Manager [phone]

## 2019-01-02 ENCOUNTER — Telehealth: Payer: Self-pay

## 2019-01-02 NOTE — Telephone Encounter (Addendum)
Called & spoke w/ pt. Pt currently has a tracheostomy and stated his original reason for calling was taken care of by someone else. I let pt know to give [place] a call back if we could help with anything going forward. Pt verbalized understanding. Nothing further needed at this time.

## 2019-01-02 NOTE — Telephone Encounter (Signed)
Attempted to call patient today regarding MW recommendations. I did not receive an answer at time of call. I have left a voicemail message for pt to return call. X1  

## 2019-01-04 DIAGNOSIS — M62830 Muscle spasm of back: Secondary | ICD-10-CM | POA: Diagnosis not present

## 2019-01-04 DIAGNOSIS — M62838 Other muscle spasm: Secondary | ICD-10-CM | POA: Diagnosis not present

## 2019-01-05 DIAGNOSIS — J449 Chronic obstructive pulmonary disease, unspecified: Secondary | ICD-10-CM | POA: Diagnosis not present

## 2019-01-05 DIAGNOSIS — J3800 Paralysis of vocal cords and larynx, unspecified: Secondary | ICD-10-CM | POA: Diagnosis not present

## 2019-01-05 DIAGNOSIS — R0982 Postnasal drip: Secondary | ICD-10-CM | POA: Diagnosis not present

## 2019-01-09 ENCOUNTER — Other Ambulatory Visit: Payer: Self-pay | Admitting: Internal Medicine

## 2019-01-09 DIAGNOSIS — R05 Cough: Secondary | ICD-10-CM

## 2019-01-09 DIAGNOSIS — R319 Hematuria, unspecified: Secondary | ICD-10-CM | POA: Diagnosis not present

## 2019-01-09 DIAGNOSIS — R058 Other specified cough: Secondary | ICD-10-CM

## 2019-01-09 DIAGNOSIS — R3121 Asymptomatic microscopic hematuria: Secondary | ICD-10-CM | POA: Diagnosis not present

## 2019-01-09 DIAGNOSIS — K449 Diaphragmatic hernia without obstruction or gangrene: Secondary | ICD-10-CM | POA: Diagnosis not present

## 2019-01-10 ENCOUNTER — Other Ambulatory Visit: Payer: Self-pay | Admitting: *Deleted

## 2019-01-10 DIAGNOSIS — R05 Cough: Secondary | ICD-10-CM

## 2019-01-10 DIAGNOSIS — R058 Other specified cough: Secondary | ICD-10-CM

## 2019-01-10 MED ORDER — PANTOPRAZOLE SODIUM 40 MG PO TBEC: DELAYED_RELEASE_TABLET | ORAL | 0 refills | Status: DC

## 2019-01-11 DIAGNOSIS — R3121 Asymptomatic microscopic hematuria: Secondary | ICD-10-CM | POA: Diagnosis not present

## 2019-01-19 IMAGING — DX DG CHEST 2V
2 series · 2 of 2 positions shown · non-contrast
Comparison: Chest x-ray of 02/13/2017

CLINICAL DATA: Recent tracheostomy removal, shortness of breath,
cough

EXAM:
CHEST  2 VIEW

[chest pa]
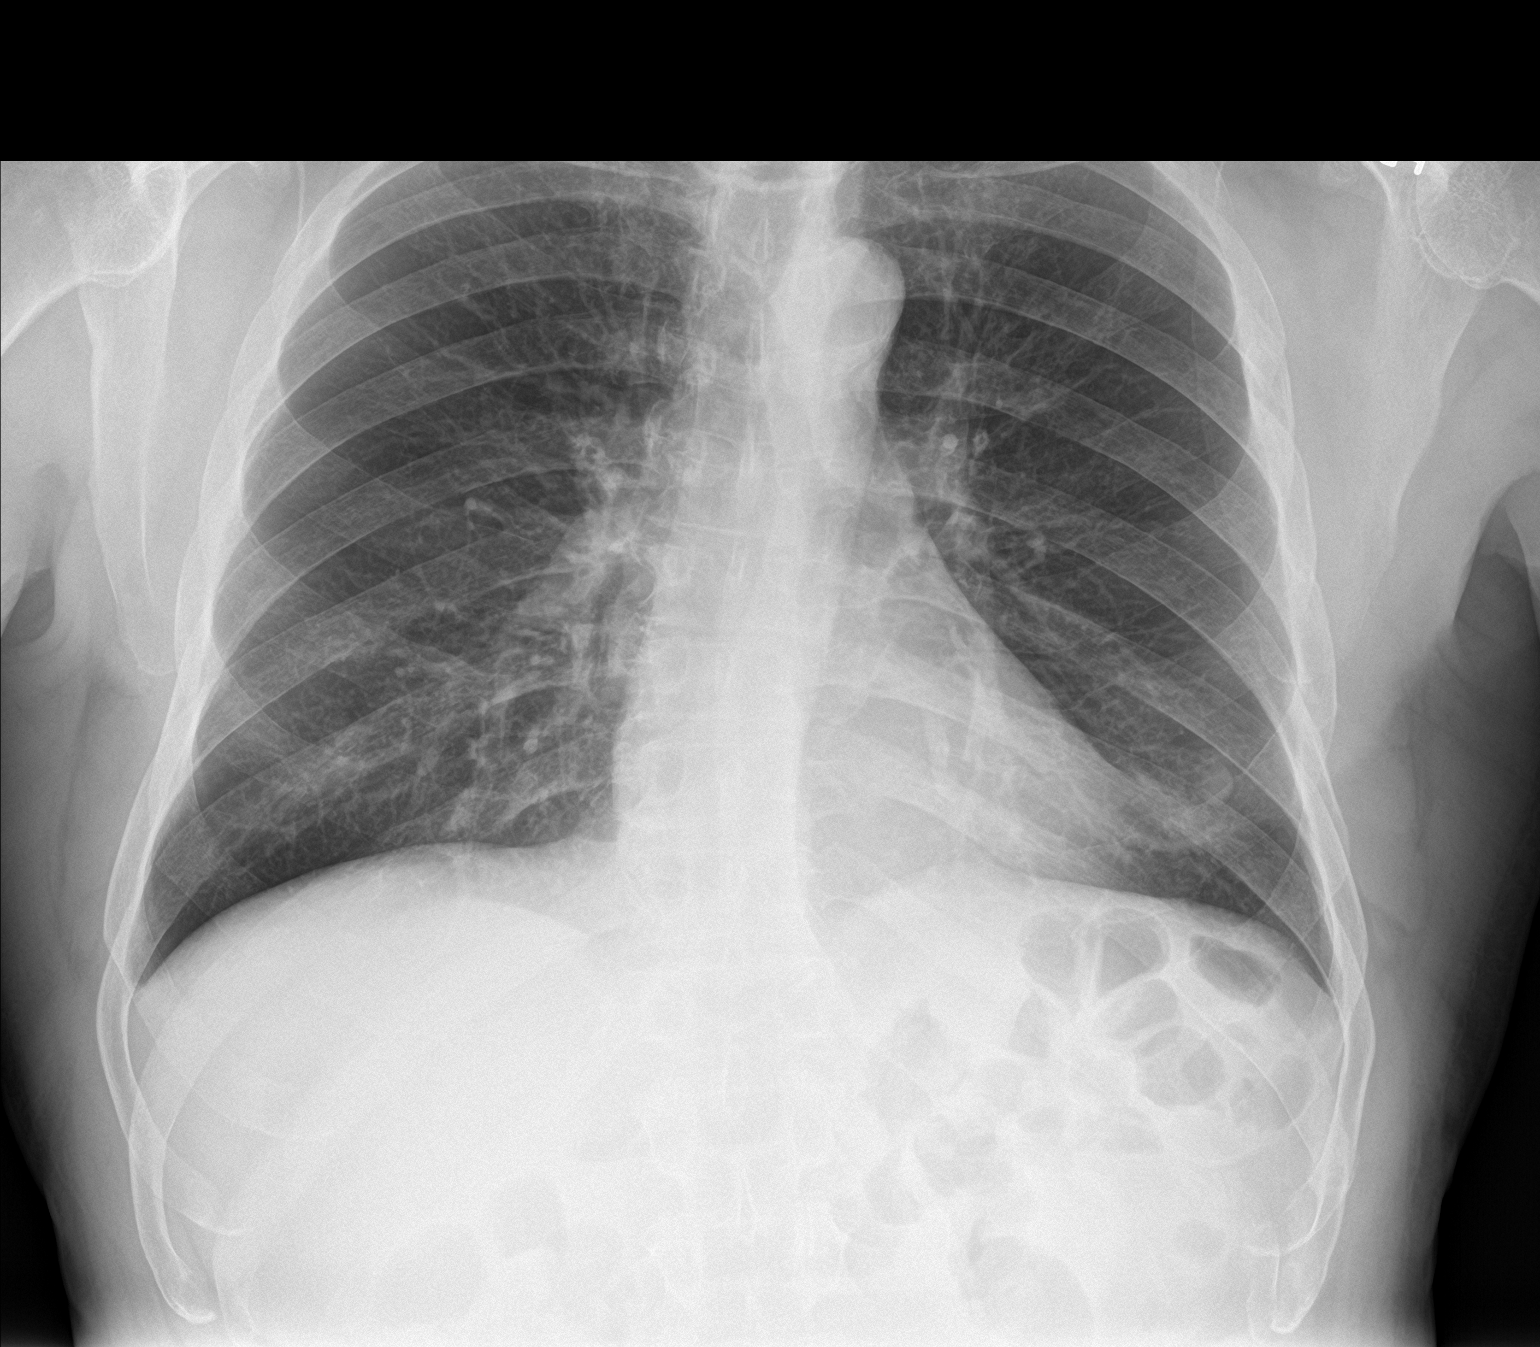

[chest lat]
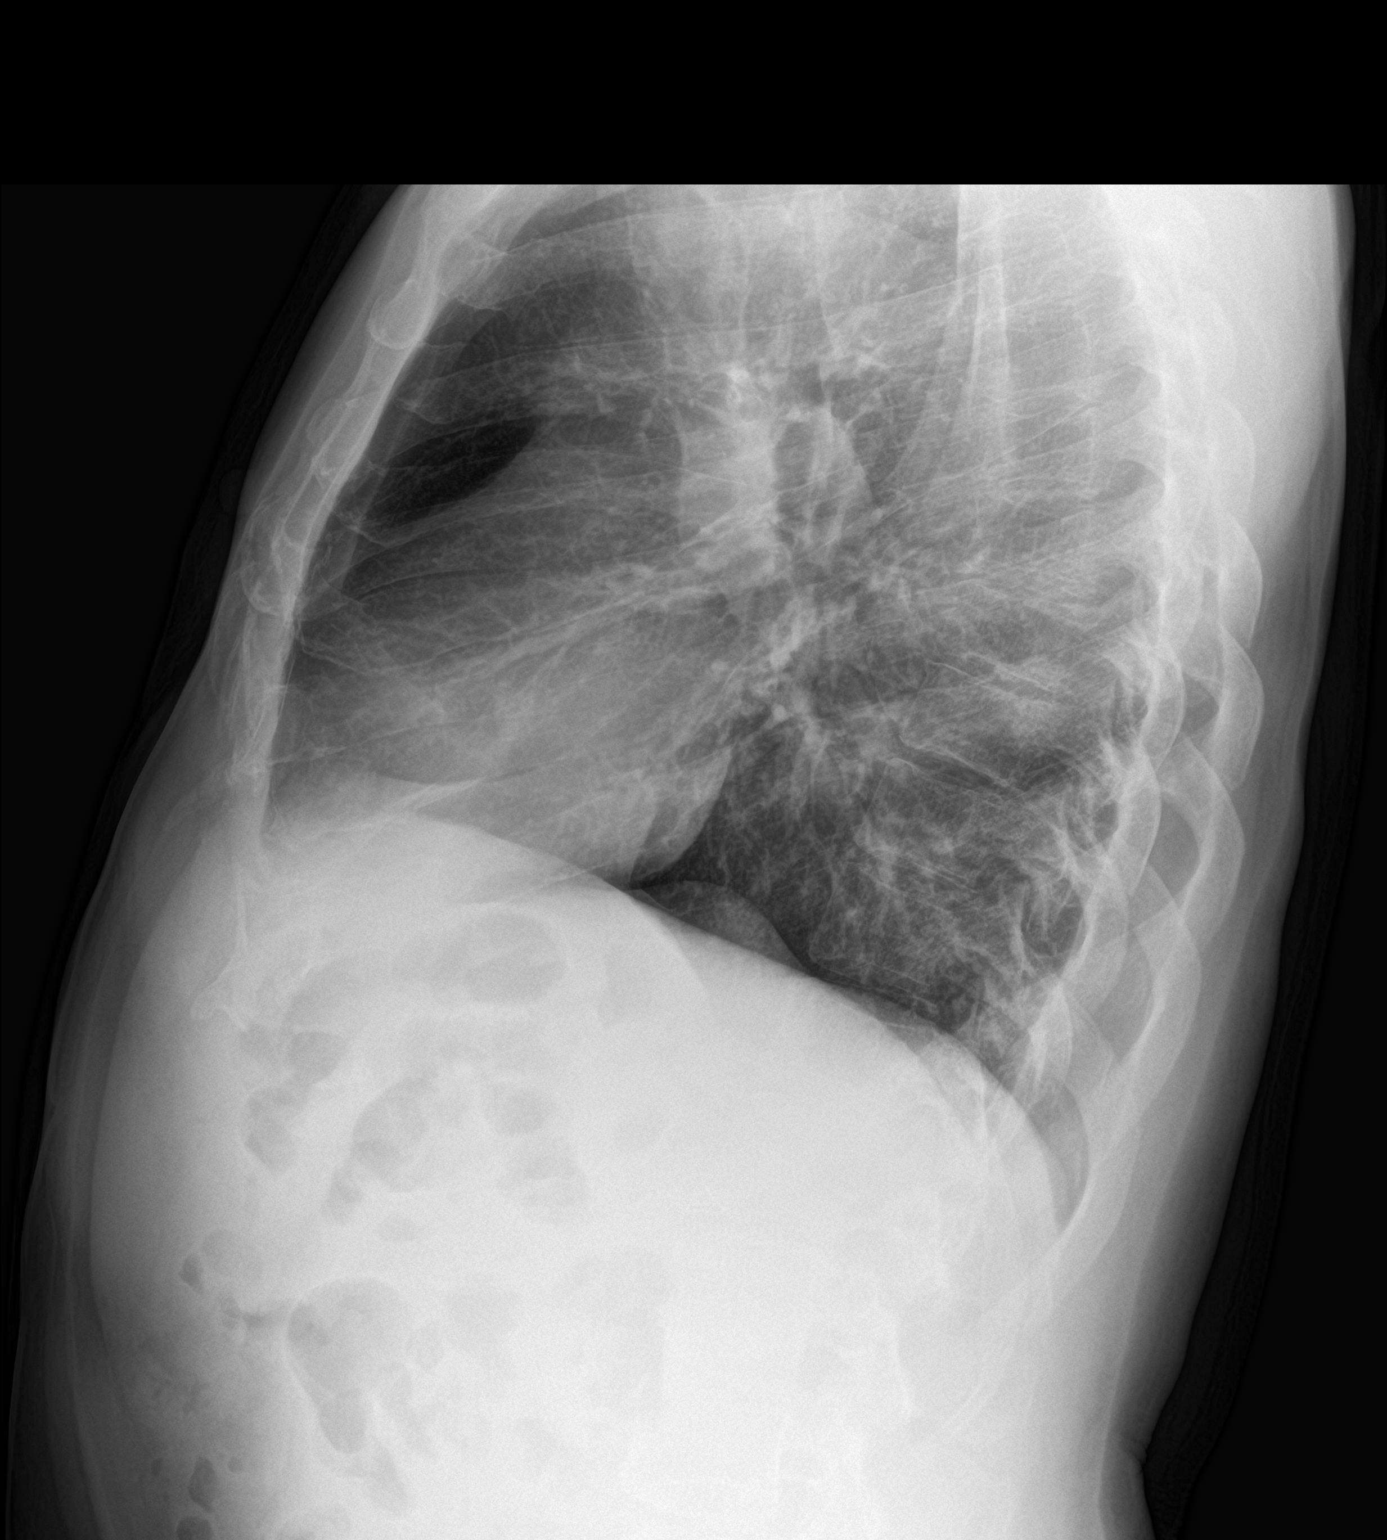

[2 of 2 positions shown; findings below may reference images not displayed]

FINDINGS: The tracheostomy is no longer seen. There is linear atelectasis or
scarring medially at the left lung base. No pleural effusion is
noted. Mediastinal and hilar contours are unremarkable. The heart is
stable in size. No acute bony abnormality is seen.
IMPRESSION: 1. Tracheostomy removed.
2. Left basilar linear atelectasis or scarring.

## 2019-01-23 DIAGNOSIS — K429 Umbilical hernia without obstruction or gangrene: Secondary | ICD-10-CM | POA: Diagnosis not present

## 2019-01-23 DIAGNOSIS — Z930 Tracheostomy status: Secondary | ICD-10-CM | POA: Diagnosis not present

## 2019-01-23 DIAGNOSIS — R3121 Asymptomatic microscopic hematuria: Secondary | ICD-10-CM | POA: Diagnosis not present

## 2019-01-23 DIAGNOSIS — Z430 Encounter for attention to tracheostomy: Secondary | ICD-10-CM | POA: Diagnosis not present

## 2019-02-01 ENCOUNTER — Other Ambulatory Visit: Payer: Self-pay | Admitting: Urology

## 2019-02-02 ENCOUNTER — Other Ambulatory Visit: Payer: Self-pay | Admitting: *Deleted

## 2019-02-02 NOTE — Patient Outreach (Signed)
[place] [facility]) Care Management  02/02/2019  [name] [name] 14-Oct-1950 [id number]   Call placed to member to follow up on trach & COPD management.  He report he is "doing pretty good."  Denies any complications with trach, last ED visit on 3/31.  No reports of blockages, has appointment next week for capping.  Denies any concerns for lack of resources during Covid 19 crisis.  State his family continue to provide support.  Need for further complex case management discussed, he denies need.  Agrees to ongoing involvement for disease management.  Will place referral to health coach and notify MD of transition.  [facility] CM Care Plan Problem One     Most Recent Value  Care Plan Problem One  Risk for ED visit/hospitalization related to COPD/compromised airway as evidenced by high ED utilization  Role Documenting the Problem One  Care Management [place] for Problem One  Active  [facility] Long Term Goal   Member will not present to ED or be hospitalized within the next 45 days  [facility] Long Term Goal Start Date  12/29/18 [facility] reset, multiple ED visits]  [facility] Long Term Goal Met Date  02/02/19     Valente David, RN, MSN [place] [phone]

## 2019-02-03 ENCOUNTER — Other Ambulatory Visit: Payer: Self-pay | Admitting: *Deleted

## 2019-02-04 DIAGNOSIS — J3800 Paralysis of vocal cords and larynx, unspecified: Secondary | ICD-10-CM | POA: Diagnosis not present

## 2019-02-04 DIAGNOSIS — J449 Chronic obstructive pulmonary disease, unspecified: Secondary | ICD-10-CM | POA: Diagnosis not present

## 2019-02-04 DIAGNOSIS — R0982 Postnasal drip: Secondary | ICD-10-CM | POA: Diagnosis not present

## 2019-02-07 DIAGNOSIS — J3802 Paralysis of vocal cords and larynx, bilateral: Secondary | ICD-10-CM | POA: Diagnosis not present

## 2019-02-07 DIAGNOSIS — R490 Dysphonia: Secondary | ICD-10-CM | POA: Diagnosis not present

## 2019-02-07 DIAGNOSIS — Z930 Tracheostomy status: Secondary | ICD-10-CM | POA: Diagnosis not present

## 2019-02-07 DIAGNOSIS — D141 Benign neoplasm of larynx: Secondary | ICD-10-CM | POA: Diagnosis not present

## 2019-02-07 DIAGNOSIS — R0600 Dyspnea, unspecified: Secondary | ICD-10-CM | POA: Diagnosis not present

## 2019-02-07 DIAGNOSIS — J386 Stenosis of larynx: Secondary | ICD-10-CM | POA: Diagnosis not present

## 2019-02-08 ENCOUNTER — Other Ambulatory Visit: Payer: Self-pay | Admitting: Internal Medicine

## 2019-02-08 DIAGNOSIS — R05 Cough: Secondary | ICD-10-CM

## 2019-02-08 DIAGNOSIS — R058 Other specified cough: Secondary | ICD-10-CM

## 2019-02-08 MED ORDER — PANTOPRAZOLE SODIUM 40 MG PO TBEC: DELAYED_RELEASE_TABLET | ORAL | 3 refills | Status: DC

## 2019-02-10 ENCOUNTER — Emergency Department (HOSPITAL_COMMUNITY): Payer: Medicare Other

## 2019-02-10 ENCOUNTER — Emergency Department (HOSPITAL_COMMUNITY)
Admission: EM | Admit: 2019-02-10 | Discharge: 2019-02-10 | Disposition: A | Discharge status: Home / Self Care | Payer: Medicare Other | Attending: Emergency Medicine | Admitting: Emergency Medicine

## 2019-02-10 ENCOUNTER — Encounter (HOSPITAL_COMMUNITY): Payer: Self-pay

## 2019-02-10 DIAGNOSIS — J189 Pneumonia, unspecified organism: Secondary | ICD-10-CM | POA: Diagnosis not present

## 2019-02-10 DIAGNOSIS — J441 Chronic obstructive pulmonary disease with (acute) exacerbation: Secondary | ICD-10-CM | POA: Diagnosis not present

## 2019-02-10 DIAGNOSIS — Z20828 Contact with and (suspected) exposure to other viral communicable diseases: Secondary | ICD-10-CM | POA: Insufficient documentation

## 2019-02-10 DIAGNOSIS — J3802 Paralysis of vocal cords and larynx, bilateral: Secondary | ICD-10-CM | POA: Diagnosis not present

## 2019-02-10 DIAGNOSIS — Z430 Encounter for attention to tracheostomy: Secondary | ICD-10-CM | POA: Diagnosis not present

## 2019-02-10 DIAGNOSIS — I10 Essential (primary) hypertension: Secondary | ICD-10-CM | POA: Diagnosis not present

## 2019-02-10 DIAGNOSIS — R0602 Shortness of breath: Secondary | ICD-10-CM | POA: Diagnosis present

## 2019-02-10 DIAGNOSIS — J984 Other disorders of lung: Secondary | ICD-10-CM | POA: Diagnosis not present

## 2019-02-10 DIAGNOSIS — I700 Atherosclerosis of aorta: Secondary | ICD-10-CM | POA: Diagnosis not present

## 2019-02-10 LAB — BASIC METABOLIC PANEL
Anion gap: 10 (ref 5–15)
BUN: 11 mg/dL (ref 8–23)
CO2: 22 mmol/L (ref 22–32)
Calcium: 8.9 mg/dL (ref 8.9–10.3)
Chloride: 101 mmol/L (ref 98–111)
Creatinine, Ser: 0.77 mg/dL (ref 0.61–1.24)
GFR calc Af Amer: >60 mL/min (ref >60)
GFR calc non Af Amer: >60 mL/min (ref >60)
Glucose, Bld: 90 mg/dL (ref 70–99)
Potassium: 3.8 mmol/L (ref 3.5–5.1)
Sodium: 133 mmol/L — ABNORMAL LOW (ref 135–145)

## 2019-02-10 LAB — CBC
HCT: 37.3 % — ABNORMAL LOW (ref 39.0–52.0)
Hemoglobin: 12.7 g/dL — ABNORMAL LOW (ref 13.0–17.0)
MCH: 29.7 pg (ref 26.0–34.0)
MCHC: 34 g/dL (ref 30.0–36.0)
MCV: 87.4 fL (ref 80.0–100.0)
Platelets: 414 10*3/uL — ABNORMAL HIGH (ref 150–400)
RBC: 4.27 MIL/uL (ref 4.22–5.81)
RDW: 13.1 % (ref 11.5–15.5)
WBC: 7.6 10*3/uL (ref 4.0–10.5)
nRBC: 0 % (ref 0.0–0.2)

## 2019-02-10 LAB — TROPONIN I: Troponin I: <0.03 ng/mL (ref <0.03)

## 2019-02-10 LAB — SARS CORONAVIRUS 2 BY RT PCR (HOSPITAL ORDER, PERFORMED IN ~~LOC~~ HOSPITAL LAB): SARS Coronavirus 2: NEGATIVE

## 2019-02-10 MED ORDER — IOHEXOL 350 MG/ML SOLN
100.0000 mL | Freq: Once | INTRAVENOUS | Status: AC | PRN
  Administered 2019-02-10: 16:00:00 100 mL via INTRAVENOUS

## 2019-02-10 MED ORDER — ALBUTEROL SULFATE HFA 108 (90 BASE) MCG/ACT IN AERS
6.0000 | INHALATION_SPRAY | Freq: Once | RESPIRATORY_TRACT | Status: AC
  Administered 2019-02-10: 6 via RESPIRATORY_TRACT
  Filled 2019-02-10: qty 6.7

## 2019-02-10 MED ORDER — DOXYCYCLINE HYCLATE 100 MG PO CAPS: 100.0000 mg | ORAL_CAPSULE | Freq: Two times a day (BID) | ORAL | 0 refills | Status: AC

## 2019-02-10 MED ORDER — METHYLPREDNISOLONE SODIUM SUCC 125 MG IJ SOLR
125.0000 mg | Freq: Once | INTRAMUSCULAR | Status: AC
  Administered 2019-02-10: 12:00:00 125 mg via INTRAVENOUS
  Filled 2019-02-10: qty 2

## 2019-02-10 NOTE — ED Notes (Addendum)
Patient ambulated, O2 sat remained 97-99%

## 2019-02-10 NOTE — ED Triage Notes (Signed)
Pt endorses diff breathing x 3 days, has a trach from a trauma 4 years ago. Denies recent travel or being around anyone sick. VSS. 97% on RA.
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
## 2019-02-10 NOTE — Discharge Instructions (Signed)
You were seen in the emergency department today with shortness of breath.  We found some evidence of pneumonia on your chest CT scan.  Your coronavirus test was negative but given the pattern of pneumonia on your CT scan I would like for you to stay indoors and avoid contact with other people in case your coronavirus test was a false negative.  You should take the antibiotic as prescribed and continue your albuterol inhaler.  If you develop worsening shortness of breath, fever, confusion, chest pain, other sudden worsening/severe symptoms he should return to the emergency department immediately for evaluation.

## 2019-02-13 ENCOUNTER — Telehealth: Payer: Self-pay | Admitting: Internal Medicine

## 2019-02-13 NOTE — Telephone Encounter (Signed)
Left message for daughter [name] [facility]) to call back.

## 2019-02-14 MED ORDER — PREDNISONE 10 MG PO TABS: ORAL_TABLET | ORAL | 0 refills | Status: DC

## 2019-02-14 NOTE — Telephone Encounter (Signed)
Returned call to patient daughter [name]. Pt went to ED on 5/29 He was dx with pneumo and COPD exacerbation given rx for abx and neb treatment. Pt feels a little better but want to know if MW will prescribe him some prednisone? Last ov with TN 11/24/18:  Patient Instructions  Glad to see you are doing well Continue Symbicort 2 puffs twice daily Continue Proventil as needed Continue to work on inhaler technique as discussed at last visit with Dr. Melvyn Novas.  Follow up: Follow-up with Dr. Melvyn Novas in 3 months or sooner if needed. Please bring medications to visit with you at next visit.

## 2019-02-14 NOTE — Telephone Encounter (Signed)
Be sure to leave the valve off the trach when having problems breathing  Prednisone 10 mg take  4 each am x 2 days,   2 each am x 2 days,  1 each am x 2 days and stop   Ov 2 weeks with all meds

## 2019-02-14 NOTE — Telephone Encounter (Signed)
Called and spoke with pt's daughter [name] stating to her the info per MW and stated to her that we were going to send pred Rx to pharmacy for pt. Verified preferred pharmacy and sent Rx in. Nothing further needed.

## 2019-02-20 NOTE — Patient Instructions (Addendum)
[name] [name]    Your procedure is scheduled on: 02-24-2019  Report to [facility] Main  Entrance              Report to admitting at 915 AM               Covid  test was done today 02/21/2019.   Call this number if you have problems the morning of surgery [phone]    Remember: Do not eat food or drink liquids :After Midnight. BRUSH YOUR TEETH MORNING OF SURGERY AND RINSE YOUR MOUTH OUT, NO CHEWING GUM CANDY OR MINTS.     Take these medicines the morning of surgery with A SIP OF WATER:  Use Symbicort inhaler and use Albuterol inhaer and bring inhalers with you to the hospital and use Albuterol nebulizer if needed                               You may not have any metal on your body including hair pins and              piercings  Do not wear jewelry, make-up, lotions, powders or perfumes, deodorant             Men may shave face and neck.   Do not bring valuables to the hospital. [place].  Contacts, dentures or bridgework may not be worn into surgery.  Leave suitcase in the car. After surgery it may be brought to your room.     Patients discharged the day of surgery will not be allowed to drive home. IF YOU ARE HAVING SURGERY AND GOING HOME THE SAME DAY, YOU MUST HAVE AN ADULT TO DRIVE YOU HOME AND BE WITH YOU FOR 24 HOURS. YOU MAY GO HOME BY TAXI OR UBER OR ORTHERWISE, BUT AN ADULT MUST ACCOMPANY YOU HOME AND STAY WITH YOU FOR 24 HOURS.  Name and phone number of your driver:               Please read over the following fact sheets you were given: _____________________________________________________________________             [facility] - Preparing for Surgery Before surgery, you can play an important role.  Because skin is not sterile, your skin needs to be as free of germs as possible.  You can reduce the number of germs on your skin by washing with CHG (chlorahexidine gluconate) soap before  surgery.  CHG is an antiseptic cleaner which kills germs and bonds with the skin to continue killing germs even after washing. Please DO NOT use if you have an allergy to CHG or antibacterial soaps.  If your skin becomes reddened/irritated stop using the CHG and inform your nurse when you arrive at Short Stay. Do not shave (including legs and underarms) for at least 48 hours prior to the first CHG shower.  You may shave your face/neck. Please follow these instructions carefully:  1.  Shower with CHG Soap the night before surgery and the  morning of Surgery.  2.  If you choose to wash your hair, wash your hair first as usual with your  normal  shampoo.  3.  After you shampoo, rinse your hair and body thoroughly to remove  the  shampoo.                           4.  Use CHG as you would any other liquid soap.  You can apply chg directly  to the skin and wash                       Gently with a scrungie or clean washcloth.  5.  Apply the CHG Soap to your body ONLY FROM THE NECK DOWN.   Do not use on face/ open                           Wound or open sores. Avoid contact with eyes, ears mouth and genitals (private parts).                       Wash face,  Genitals (private parts) with your normal soap.             6.  Wash thoroughly, paying special attention to the area where your surgery  will be performed.  7.  Thoroughly rinse your body with warm water from the neck down.  8.  DO NOT shower/wash with your normal soap after using and rinsing off  the CHG Soap.                9.  Pat yourself dry with a clean towel.            10.  Wear clean pajamas.            11.  Place clean sheets on your bed the night of your first shower and do not  sleep with pets. Day of Surgery : Do not apply any lotions/deodorants the morning of surgery.  Please wear clean clothes to the hospital/surgery center.  FAILURE TO FOLLOW THESE INSTRUCTIONS MAY RESULT IN THE CANCELLATION OF YOUR SURGERY PATIENT  SIGNATURE_________________________________  NURSE SIGNATURE__________________________________  ________________________________________________________________________

## 2019-02-20 NOTE — Progress Notes (Signed)
CHEST CT 02-10-19 Epic TELEPHONE NOTE DR Melvyn Novas 02-13-19 Epic ENT LOV NOTE 02-07-19 Epic EKG 02-10-19

## 2019-02-20 NOTE — Progress Notes (Signed)
SENT SECURE CHAT NOTE TO JESSICA ZANETTO PA TO ASSESS PATIENT.

## 2019-02-21 ENCOUNTER — Other Ambulatory Visit (HOSPITAL_COMMUNITY)
Admission: RE | Admit: 2019-02-21 | Discharge: 2019-02-21 | Disposition: A | Discharge status: Home / Self Care | Payer: Medicare Other | Source: Ambulatory Visit | Attending: Urology | Admitting: Urology

## 2019-02-21 ENCOUNTER — Other Ambulatory Visit: Payer: Self-pay

## 2019-02-21 ENCOUNTER — Encounter (HOSPITAL_COMMUNITY)
Admission: RE | Admit: 2019-02-21 | Discharge: 2019-02-21 | Disposition: A | Discharge status: Home / Self Care | Payer: Medicare Other | Source: Ambulatory Visit | Attending: Urology | Admitting: Urology

## 2019-02-21 ENCOUNTER — Encounter (HOSPITAL_COMMUNITY): Payer: Self-pay

## 2019-02-21 DIAGNOSIS — N323 Diverticulum of bladder: Secondary | ICD-10-CM | POA: Insufficient documentation

## 2019-02-21 DIAGNOSIS — Z01812 Encounter for preprocedural laboratory examination: Secondary | ICD-10-CM | POA: Diagnosis not present

## 2019-02-21 DIAGNOSIS — Z1159 Encounter for screening for other viral diseases: Secondary | ICD-10-CM | POA: Insufficient documentation

## 2019-02-21 LAB — CBC
HCT: 39.2 % (ref 39.0–52.0)
Hemoglobin: 12.6 g/dL — ABNORMAL LOW (ref 13.0–17.0)
MCH: 29.4 pg (ref 26.0–34.0)
MCHC: 32.1 g/dL (ref 30.0–36.0)
MCV: 91.6 fL (ref 80.0–100.0)
Platelets: 389 10*3/uL (ref 150–400)
RBC: 4.28 MIL/uL (ref 4.22–5.81)
RDW: 13.6 % (ref 11.5–15.5)
WBC: 6.2 10*3/uL (ref 4.0–10.5)
nRBC: 0 % (ref 0.0–0.2)

## 2019-02-21 LAB — BASIC METABOLIC PANEL
Anion gap: 6 (ref 5–15)
BUN: 11 mg/dL (ref 8–23)
CO2: 29 mmol/L (ref 22–32)
Calcium: 8.7 mg/dL — ABNORMAL LOW (ref 8.9–10.3)
Chloride: 101 mmol/L (ref 98–111)
Creatinine, Ser: 0.69 mg/dL (ref 0.61–1.24)
GFR calc Af Amer: >60 mL/min (ref >60)
GFR calc non Af Amer: >60 mL/min (ref >60)
Glucose, Bld: 85 mg/dL (ref 70–99)
Potassium: 3.7 mmol/L (ref 3.5–5.1)
Sodium: 136 mmol/L (ref 135–145)

## 2019-02-21 NOTE — Progress Notes (Signed)
PAT assessment/symptoms:    Patient with Tracheostomy tube in place.     Patient denies any shortness of breath. Patient states    he finished the Doxycycline on Friday 02/17/2019.
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
## 2019-02-22 ENCOUNTER — Telehealth: Payer: Self-pay | Admitting: Internal Medicine

## 2019-02-22 LAB — NOVEL CORONAVIRUS, NAA (HOSP ORDER, SEND-OUT TO REF LAB; TAT 18-24 HRS): SARS-CoV-2, NAA: NOT DETECTED

## 2019-02-22 NOTE — Telephone Encounter (Signed)
Call returned to patient daughter chimere (drp), she wanted to know why we called her dad. She states he has a trach and she is not sure who called. I made her aware I do not see a telephone encounter, recent labs or, imaging so I am not sure who called. I made sure the patient did not need anything. Voiced understanding. Nothing further is needed at this time.

## 2019-02-22 NOTE — Telephone Encounter (Signed)
LMTCB

## 2019-02-23 DIAGNOSIS — Z930 Tracheostomy status: Secondary | ICD-10-CM | POA: Diagnosis not present

## 2019-02-23 DIAGNOSIS — Z430 Encounter for attention to tracheostomy: Secondary | ICD-10-CM | POA: Diagnosis not present

## 2019-02-27 ENCOUNTER — Ambulatory Visit (INDEPENDENT_AMBULATORY_CARE_PROVIDER_SITE_OTHER): Payer: Medicare Other | Admitting: Internal Medicine

## 2019-02-27 ENCOUNTER — Other Ambulatory Visit: Payer: Self-pay

## 2019-02-27 ENCOUNTER — Telehealth: Payer: Self-pay | Admitting: Internal Medicine

## 2019-02-27 ENCOUNTER — Encounter: Payer: Self-pay | Admitting: Internal Medicine

## 2019-02-27 DIAGNOSIS — R058 Other specified cough: Secondary | ICD-10-CM

## 2019-02-27 DIAGNOSIS — R05 Cough: Secondary | ICD-10-CM | POA: Diagnosis not present

## 2019-02-27 NOTE — Telephone Encounter (Signed)
Called and spoke with pt and reminded him of his appt with MW tomorrow, 6/16 at 2:30 and stated to pt to make sure he brought all meds with him to the appt. Pt verbalized understanding.  Nothing further needed.

## 2019-02-27 NOTE — Telephone Encounter (Signed)
Pt returning call. Per Magda Paganini, pt needs a in office f/up apt for Surgery Clearance. Apt made with pt-MW on 06/16 at 2:30. Pt aware that this is an in office apt.

## 2019-02-27 NOTE — Patient Instructions (Signed)
Needs office visit next available with all meds, devices in hand

## 2019-02-27 NOTE — Assessment & Plan Note (Signed)
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 > referred to [facility] for second opinion> trach December 16 2016 > Trach surgery 03/15/17   / trach out end of summer /early fall 2018  10/27/17 ENT f/u "Ok" airway  - 01/14/2018 PFT f/u loop C/w mod severe truncation of insp loop > rx gerd and referred back to [facility]>  Re trach 03/14/18   Not able to troubleshoot with televisit > ov for clearance  with all meds in hand using a trust but verify approach to confirm accurate Medication  Reconciliation The principal here is that until we are certain that the  patients are doing what we've asked, it makes no sense to ask them to do more.

## 2019-02-27 NOTE — Telephone Encounter (Signed)
ATC patient to remind to bring ALL medication with him to appt 6/16. No answer/nor machine.
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
## 2019-02-28 ENCOUNTER — Ambulatory Visit (INDEPENDENT_AMBULATORY_CARE_PROVIDER_SITE_OTHER): Payer: Medicare Other | Admitting: Internal Medicine

## 2019-02-28 ENCOUNTER — Other Ambulatory Visit: Payer: Self-pay

## 2019-02-28 ENCOUNTER — Ambulatory Visit (INDEPENDENT_AMBULATORY_CARE_PROVIDER_SITE_OTHER): Payer: Medicare Other

## 2019-02-28 ENCOUNTER — Encounter: Payer: Self-pay | Admitting: Internal Medicine

## 2019-02-28 ENCOUNTER — Other Ambulatory Visit: Payer: Self-pay | Admitting: Urology

## 2019-02-28 DIAGNOSIS — J9819 Other pulmonary collapse: Secondary | ICD-10-CM | POA: Diagnosis not present

## 2019-02-28 DIAGNOSIS — R0609 Other forms of dyspnea: Secondary | ICD-10-CM

## 2019-02-28 DIAGNOSIS — J4530 Mild persistent asthma, uncomplicated: Secondary | ICD-10-CM | POA: Diagnosis not present

## 2019-02-28 DIAGNOSIS — R058 Other specified cough: Secondary | ICD-10-CM

## 2019-02-28 DIAGNOSIS — R05 Cough: Secondary | ICD-10-CM | POA: Diagnosis not present

## 2019-02-28 DIAGNOSIS — E785 Hyperlipidemia, unspecified: Secondary | ICD-10-CM | POA: Diagnosis not present

## 2019-02-28 DIAGNOSIS — I10 Essential (primary) hypertension: Secondary | ICD-10-CM | POA: Diagnosis not present

## 2019-02-28 DIAGNOSIS — R7303 Prediabetes: Secondary | ICD-10-CM | POA: Diagnosis not present

## 2019-02-28 MED ORDER — PREDNISONE 10 MG PO TABS: ORAL_TABLET | ORAL | 0 refills | Status: DC

## 2019-02-28 NOTE — Patient Instructions (Addendum)
[name]    Your procedure is scheduled on: 03-08-2019   Report to [facility] Main  Entrance    Report to admitting at 1000 AM   [place] [facility] TEST ON 03-04-19  @ 10:45AM_______, THIS TEST MUST BE DONE BEFORE SURGERY, COME TO [place]. ONCE YOUR COVID TEST IS COMPLETED, PLEASE BEGIN THE QUARANTINE INSTRUCTIONS AS OUTLINED IN YOUR HANDOUT.   Call this number if you have problems the morning of surgery [phone]    Remember: Do not eat food or drink liquids :After Midnight. BRUSH YOUR TEETH MORNING OF SURGERY AND RINSE YOUR MOUTH OUT, NO CHEWING GUM CANDY OR MINTS.     Take these medicines the morning of surgery with A SIP OF WATER: terazosin(hytrin), albuterol inhaler if needed and bring inhaler with you, albuterol nebulizer, finasteride (proscar), pantaprazole (protonix)                               You may not have any metal on your body including hair pins and              piercings     Do not wear jewelry, make-up, lotions, powders or perfumes, deodorant                          Men may shave face and neck.   Do not bring valuables to the hospital. [place].  Contacts, dentures or bridgework may not be worn into surgery. .     Patients discharged the day of surgery will not be allowed to drive home. IF YOU ARE HAVING SURGERY AND GOING HOME THE SAME DAY, YOU MUST HAVE AN ADULT TO DRIVE YOU HOME AND BE WITH YOU FOR 24 HOURS. YOU MAY GO HOME BY TAXI OR UBER OR ORTHERWISE, BUT AN ADULT MUST ACCOMPANY YOU HOME AND STAY WITH YOU FOR 24 HOURS.  Name and phone number of your driver: Bradely Rudin [phone]  Special Instructions: N/A              Please read over the following fact sheets you were given: _____________________________________________________________________             [facility] - Preparing for Surgery Before surgery, you  can play an important role.  Because skin is not sterile, your skin needs to be as free of germs as possible.  You can reduce the number of germs on your skin by washing with CHG (chlorahexidine gluconate) soap before surgery.  CHG is an antiseptic cleaner which kills germs and bonds with the skin to continue killing germs even after washing. Please DO NOT use if you have an allergy to CHG or antibacterial soaps.  If your skin becomes reddened/irritated stop using the CHG and inform your nurse when you arrive at Short Stay. Do not shave (including legs and underarms) for at least 48 hours prior to the first CHG shower.  You may shave your face/neck. Please follow these instructions carefully:  1.  Shower with CHG Soap the night before surgery and the  morning of Surgery.  2.  If you choose to wash your hair, wash your hair first as usual with your  normal  shampoo.  3.  After you shampoo, rinse your hair and body thoroughly to remove the  shampoo.                           4.  Use CHG as you would any other liquid soap.  You can apply chg directly  to the skin and wash                       Gently with a scrungie or clean washcloth.  5.  Apply the CHG Soap to your body ONLY FROM THE NECK DOWN.   Do not use on face/ open                           Wound or open sores. Avoid contact with eyes, ears mouth and genitals (private parts).                       Wash face,  Genitals (private parts) with your normal soap.             6.  Wash thoroughly, paying special attention to the area where your surgery  will be performed.  7.  Thoroughly rinse your body with warm water from the neck down.  8.  DO NOT shower/wash with your normal soap after using and rinsing off  the CHG Soap.                9.  Pat yourself dry with a clean towel.            10.  Wear clean pajamas.            11.  Place clean sheets on your bed the night of your first shower and do not  sleep with pets. Day of Surgery : Do not apply  any lotions/deodorants the morning of surgery.  Please wear clean clothes to the hospital/surgery center.  FAILURE TO FOLLOW THESE INSTRUCTIONS MAY RESULT IN THE CANCELLATION OF YOUR SURGERY PATIENT SIGNATURE_________________________________  NURSE SIGNATURE__________________________________  ________________________________________________________________________

## 2019-02-28 NOTE — Assessment & Plan Note (Signed)
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 > referred to [facility] for second opinion> trach December 16 2016 > Trach surgery 03/15/17   / trach out end of summer /early fall 2018  10/27/17 ENT f/u "Ok" airway  - 01/14/2018 PFT f/u loop C/w mod severe truncation of insp loop > rx gerd and referred back to [facility]>  Re trach 03/14/18    Discussed with pt/ daughter and Dr Mila Homer confusion re uao vs AB - the latter component I think is mild and would be easy to treat if we could assure he is able to bypass the uao effectively with trach and adaptors for inahlers/ nebs into the trach being used > Dr Rowe Clack to address available trachs/ adaptors and we'll address the inhalers (see separate a/p)   In meantime needs trach out at hs and humified trach collar plus mucinex 1200 mg bid
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
## 2019-02-28 NOTE — Assessment & Plan Note (Signed)
Onset p trach placed in 2017 10/30/2016    try reduce symb to 80 2bid  - 01/21/2017  After extensive coaching HFA effectiveness =    75% via adaptor to trach  - Spirometry 12/14/2017  FEV1 1.59 (66%)  Ratio 65 after symb with poor hfa  - 12/14/2017  After extensive coaching inhaler device  effectiveness =    75% from a baseline of 25 % > continue symb 160 2bid  - PFT's  01/14/2018  FEV1 1.32 (51 % ) ratio 63  p 3 % improvement from saba p ? prior to study with DLCO  58/61 % corrects to 118  % for alv volume - 01/14/2018  After extensive coaching inhaler device  effectiveness =    75% try change to bevespi and floor dose of pred @ 10 mg daily > did not do - on singulair as of ov 07/14/2018  - Spirometry 07/14/2018  FEV1 1.2  (48%)  Ratio 61  - 07/14/2018   change from bevespi to symbicort 160 2bid    - 02/28/2019  After extensive coaching inhaler device,  effectiveness =    0% (no adequate insp flow) so rec all inhalers, nebs via trach and ideally may need bud/formterol per trach for optimal control   For now rx with pred x 6 days preop and cleared for surgery since periop can use trach for both meds and ventilatoin

## 2019-02-28 NOTE — Patient Instructions (Addendum)
We will try to contact your daughter at [phone] the next time we attempt a televisit   Prednisone 10 mg take  4 each am x 2 days,   2 each am x 2 days,  1 each am x 2 days and stop   For cough / congestion > mucinex 1200 mg every 12 hours (600 x 2 in am and pm ) as needed    Talk to your trach doctors about adaptors for your medication/ keep the humdified air on as much as possible    Plan A = Automatic =  symbicort 160 Take 2 puffs first thing in am and then another 2 puffs about 12 hours later thru whatever spacer / adaptor you have     Plan B = Backup Only use your albuterol inhaler as a rescue medication to be used if you can't catch your breath by resting or doing a relaxed purse lip breathing pattern.  - The less you use it, the better it will work when you need it. - Ok to use the inhaler up to 2 puffs  every 4 hours if you must but call for appointment if use goes up over your usual need - Don't leave home without it !!  (think of it like the spare tire for your car)   Plan C = Crisis - only use your albuterol nebulizer if you first try Plan B and it fails to help > ok to use the nebulizer up to 1 treatment  every 4 hours but if start needing it regularly call for immediate appointment   All inhaled medications should go thru your trach not thru your mouth.   You are cleared for surgery unless you have a flare of your breathing problem pending follow up cxr today  Please remember to go to the  x-ray department  for your tests - we will call you with the results when they are available

## 2019-02-28 NOTE — Progress Notes (Addendum)
Pulmonary function test 01-14-18 epic ekg 02-10-2019 epic To see dr wert 02-28-2019 for pulmonary clearance Chest ct 02-10-19 epic

## 2019-02-28 NOTE — Assessment & Plan Note (Signed)
New since 09/07/18   Most likely from mucus plugging in this never smoker > rx suctioning/ bronchodilators/ mucinex reviewed   I had an extended discussion with the patient reviewing all relevant studies completed to date and  lasting 15 to 20 minutes of a 25 minute visit    See device teaching which extended face to face time for this visit.  Each maintenance medication was reviewed in detail including emphasizing most importantly the difference between maintenance and prns and under what circumstances the prns are to be triggered using an action plan format that is not reflected in the computer generated alphabetically organized AVS which I have not found useful in most complex patients, especially with respiratory illnesses  Please see AVS for specific instructions unique to this visit that I personally wrote and verbalized to the the pt in detail and then reviewed with pt  by my nurse highlighting any  changes in therapy recommended at today's visit to their plan of care.

## 2019-03-01 ENCOUNTER — Telehealth: Payer: Self-pay | Admitting: Adult Health

## 2019-03-01 ENCOUNTER — Encounter (HOSPITAL_COMMUNITY): Payer: Self-pay

## 2019-03-01 ENCOUNTER — Other Ambulatory Visit: Payer: Self-pay

## 2019-03-01 ENCOUNTER — Encounter (HOSPITAL_COMMUNITY)
Admission: RE | Admit: 2019-03-01 | Discharge: 2019-03-01 | Disposition: A | Discharge status: Home / Self Care | Payer: Medicare Other | Source: Ambulatory Visit | Attending: Urology | Admitting: Urology

## 2019-03-01 ENCOUNTER — Telehealth: Payer: Self-pay | Admitting: Internal Medicine

## 2019-03-01 NOTE — Telephone Encounter (Signed)
Error

## 2019-03-01 NOTE — Progress Notes (Signed)
LMTCB

## 2019-03-01 NOTE — Telephone Encounter (Signed)
Notes recorded by Tanda Rockers, MD on 03/01/2019 at 10:26 AM EDT  Call pt: Reviewed cxr and no acute change so no change in recommendations made at [facility] and spoke with pt letting him know the results of the cxr. Pt verbalized understanding. Nothing further needed.

## 2019-03-01 NOTE — Progress Notes (Addendum)
02-27-19  Pulmonary Clearance from Dr. Melvyn Novas on chart. Per Konrad Felix PA, okay to used labs drawn on 03/03/19

## 2019-03-02 ENCOUNTER — Other Ambulatory Visit: Payer: Self-pay | Admitting: *Deleted

## 2019-03-02 ENCOUNTER — Telehealth: Payer: Self-pay | Admitting: Internal Medicine

## 2019-03-02 NOTE — Telephone Encounter (Signed)
Yes ok to use NS "bullets"  prn with suctioning  For mucus plugs

## 2019-03-02 NOTE — Telephone Encounter (Signed)
Called and spoke with pts spouse who says hh nurse mentioned pt may benefit from saline bullets. Pt is wanting to know if its to use saline bullets?

## 2019-03-02 NOTE — Anesthesia Preprocedure Evaluation (Addendum)
Anesthesia Evaluation  Patient identified by MRN, date of birth, ID band Patient awake    Reviewed: Allergy & Precautions, NPO status , Patient's Chart, lab work & pertinent test results  Airway Mallampati: Froid  (+) Poor Dentition   Pulmonary    Pulmonary exam normal breath sounds clear to auscultation       Cardiovascular hypertension, Pt. on medications Normal cardiovascular exam Rhythm:Regular Rate:Normal     Neuro/Psych    GI/Hepatic   Endo/Other    Renal/GU      Musculoskeletal   Abdominal Normal abdominal exam  (+)   Peds  Hematology   Anesthesia Other Findings   Reproductive/Obstetrics                           Anesthesia Physical Anesthesia Plan  ASA: III  Anesthesia Plan: General   Post-op Pain Management:    Induction: Intravenous  PONV Risk Score and Plan: 4 or greater and Ondansetron and Dexamethasone  Airway Management Planned: Tracheostomy  Additional Equipment:   Intra-op Plan:   Post-operative Plan: Extubation in OR  Informed Consent: I have reviewed the patients History and Physical, chart, labs and discussed the procedure including the risks, benefits and alternatives for the proposed anesthesia with the patient or authorized representative who has indicated his/her understanding and acceptance.     Dental advisory given  Plan Discussed with:   Anesthesia Plan Comments: (See PAT note 02/21/2019, Konrad Felix, PA-C)       Anesthesia Quick Evaluation                                  Anesthesia Evaluation  Patient identified by MRN, date of birth, ID band Patient awake    Reviewed: Allergy & Precautions, NPO status , Patient's Chart, lab work & pertinent test results, reviewed documented beta blocker date and time   History of Anesthesia Complications Negative for: history of anesthetic  complications  Airway       Comment:  Tracheostomy Dental   Pulmonary asthma , COPD,  COPD inhaler,   Tracheostomy B/l complete vocal cord paralysis    breath sounds clear to auscultation       Cardiovascular hypertension, Pt. on medications  Rhythm:Regular Rate:Normal   '17 TTE - EF 55% to 60%. RA was mildly dilated. PASP was mildly increased. PA peak pressure: 43 mm Hg.     Neuro/Psych Anxiety negative neurological ROS     GI/Hepatic negative GI ROS, (+)     substance abuse  alcohol use and marijuana use,   Endo/Other  negative endocrine ROS  Renal/GU negative Renal ROS  negative genitourinary   Musculoskeletal  (+) Arthritis ,   Abdominal   Peds  Hematology negative hematology ROS (+)   Anesthesia Other Findings   Reproductive/Obstetrics                            Anesthesia Physical Anesthesia Plan  ASA: III  Anesthesia Plan: General   Post-op Pain Management:    Induction: Inhalational  PONV Risk Score and Plan: 3 and Treatment may vary due to age or medical condition, Ondansetron and Dexamethasone  Airway Management Planned: Tracheostomy  Additional Equipment: None  Intra-op Plan:   Post-operative Plan: Extubation in OR  Informed Consent: I have reviewed the patients History and Physical,  chart, labs and discussed the procedure including the risks, benefits and alternatives for the proposed anesthesia with the patient or authorized representative who has indicated his/her understanding and acceptance.     Plan Discussed with: CRNA and Anesthesiologist  Anesthesia Plan Comments: ( Will have cuffed 6.0 trach tube to exchange for patient's uncuffed Shiley)       Anesthesia Quick Evaluation

## 2019-03-02 NOTE — Telephone Encounter (Signed)
Called pt and advised message from the provider. Pt understood and verbalized understanding. Nothing further is needed.    

## 2019-03-02 NOTE — Telephone Encounter (Signed)
Called and spoke with Butch Penny from [facility]. Butch Penny wanted to know if we might know where pt could be able to get a spacer that connects to the trach for his inhaler and I stated to her that they might need to contact the DME to see if they could help them out and she verbalized understanding. Nothing further needed.

## 2019-03-02 NOTE — Patient Outreach (Signed)
[facility]) Care Management  03/02/2019   [name] [name] 25-Jun-1951 [id number]  RN Health Coach telephone call to patient.  [employer] compliance verified. Per patient he is doing good. Patient stated that he is having surgery next week and will be going for a coronavirus testing. Patient is not having any problems breathing. Patient stated that next month he will be going for a trach down size and then looking at capping. Patient stated that he is eating what ever he wants and drinks ensure nutritional supplement sometimes. Patient talks in a very low whispery voice but can understand verbalization. Patient is asking about the use of saline in his trach like was being used in the hospital. Patient stated that he felt so much better when the respiratory therapist squirted a little in his trach it helped to loosen up the mucous and they were able to clean if better. Patient stated he was able to breathe much better. RN told patient he needs to talk with his Dr before purchasing to make sure this is permissible by his physician. RN explained that the Dr wouldn't want him to aspirate and get pneumonia. Patient stated they he would discuss with his physician. Patient is taking his medications as prescribed. Patient has agreed to follow up outreach calls.    Current Medications:  Current Outpatient Medications  Medication Sig Dispense Refill  . albuterol (PROVENTIL HFA;VENTOLIN HFA) 108 (90 Base) MCG/ACT inhaler Inhale 2 puffs into the lungs every 6 (six) hours as needed for wheezing or shortness of breath. 1 Inhaler 2  . albuterol (PROVENTIL) (2.5 MG/3ML) 0.083% nebulizer solution Take 3-6 mLs (2.5-5 mg total) by nebulization every 4 (four) hours as needed for wheezing or shortness of breath. 30 vial 0  . budesonide-formoterol (SYMBICORT) 160-4.5 MCG/ACT inhaler Take 2 puffs first thing in am and then another 2 puffs about 12 hours later. (Patient taking differently: Inhale 2 puffs into the  lungs 2 (two) times daily. ) 1 Inhaler 11  . fluticasone (FLONASE) 50 MCG/ACT nasal spray Place 2 sprays into both nostrils daily.    Marland Kitchen guaiFENesin (MUCINEX) 600 MG 12 hr tablet Take 600 mg by mouth 2 (two) times daily.    . hydrochlorothiazide (HYDRODIURIL) 25 MG tablet Take 1 tablet (25 mg total) by mouth daily. 30 tablet 0  . losartan (COZAAR) 25 MG tablet Take 25 mg by mouth daily.     . Multiple Vitamin (MULTIVITAMIN) tablet Take 1 tablet by mouth daily.    . pantoprazole (PROTONIX) 40 MG tablet TAKE 1 TABLET(40 MG) BY MOUTH DAILY 30 TO 60 MINUTES BEFORE FIRST MEAL OF THE DAY (Patient taking differently: Take 40 mg by mouth daily. ) 90 tablet 3  . predniSONE (DELTASONE) 10 MG tablet Take 4tabsx2days, 2tabsx2days,1tabx2days,then stop 14 tablet 0   No current facility-administered medications for this visit.     Functional Status:  In your present state of health, do you have any difficulty performing the following activities: 03/02/2019 03/01/2019  Hearing? N N  Vision? N N  Difficulty concentrating or making decisions? N N  Walking or climbing stairs? Y N  Comment due to trach and breathing -  Dressing or bathing? N -  Doing errands, shopping? N N  Preparing Food and eating ? N -  Using the Toilet? N -  In the past six months, have you accidently leaked urine? Y -  Do you have problems with loss of bowel control? N -  Managing your Medications? N -  Managing  your Finances? N -  Housekeeping or managing your Housekeeping? N -  Some recent data might be hidden    Fall/Depression Screening: Fall Risk  03/02/2019 10/26/2018  Falls in the past year? 0 0   PHQ 2/9 Scores 03/02/2019 10/05/2018  PHQ - 2 Score 0 0   [facility] CM Care Plan Problem One     Most Recent Value  Care Plan Problem One  Knowledge Deficit in Self Management of COPD  Role Documenting the Problem One  [place] for Problem One  Active  [facility] Long Term Goal   Patient will not have any admissions for COPD  exacerbation within the next 90 days  [facility]N Long Term Goal Start Date  03/02/19  Interventions for Problem One Long Term Goal  RN discussed COPD exacerbation. RN discussed medication adherence. RN discussed keeping appointments. Patient is aware of COPD zone and action plan. RN will follow up with further discussion  THN CM Short Term Goal #1   Patient will verbalize having a better understanding of COPD and eating plan within the next 30 days  THN CM Short Term Goal #1 Start Date  03/02/19  Interventions for Short Term Goal #1  Rn discussed patient eating and dietary habits. RN sent educational material on COPD and eating plan. RN will follow up with further discussion  THN CM Short Term Goal #2   Patient will verbalize having a better understanding of CCOPD and physical activity within the next 30 days  Interventions for Short Term Goal #2  RN discussed physical activity and rest. RN sent educational material on COPd and physical activity. RN will follow up with further discussion     Assessment:  Patient is doing self trach care Patient is eating and using ensure as supplement Patient asked questions about using the saline bullets Patient is aware of pandemic safety precautions Plan:  RN discussed Copd and activity RN discussed eating plan for COPD RN discussed the Copd exacerbation RN sent educational material on Eating plan for COPD RN sent educational material on COPD exacerbation' RN sent a COPD form  RN sent assessment and barriers letter RN will follow up within the month of September   [name] [name] [place] [facility] [phone]

## 2019-03-04 ENCOUNTER — Other Ambulatory Visit (HOSPITAL_COMMUNITY)
Admission: RE | Admit: 2019-03-04 | Discharge: 2019-03-04 | Disposition: A | Discharge status: Home / Self Care | Payer: Medicare Other | Source: Ambulatory Visit | Attending: Urology | Admitting: Urology

## 2019-03-04 DIAGNOSIS — Z1159 Encounter for screening for other viral diseases: Secondary | ICD-10-CM | POA: Insufficient documentation

## 2019-03-05 LAB — SARS CORONAVIRUS 2 (TAT 6-24 HRS): SARS Coronavirus 2: NEGATIVE
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
## 2019-03-07 DIAGNOSIS — J449 Chronic obstructive pulmonary disease, unspecified: Secondary | ICD-10-CM | POA: Diagnosis not present

## 2019-03-07 DIAGNOSIS — R0982 Postnasal drip: Secondary | ICD-10-CM | POA: Diagnosis not present

## 2019-03-07 DIAGNOSIS — J3800 Paralysis of vocal cords and larynx, unspecified: Secondary | ICD-10-CM | POA: Diagnosis not present

## 2019-03-07 MED ORDER — GENTAMICIN SULFATE 40 MG/ML IJ SOLN
5.0000 mg/kg | INTRAVENOUS | Status: AC
  Administered 2019-03-08: 330 mg via INTRAVENOUS
  Filled 2019-03-07: qty 8.25

## 2019-03-08 ENCOUNTER — Ambulatory Visit (HOSPITAL_COMMUNITY): Payer: Medicare Other | Admitting: Physician Assistant

## 2019-03-08 ENCOUNTER — Other Ambulatory Visit: Payer: Self-pay

## 2019-03-08 ENCOUNTER — Encounter (HOSPITAL_COMMUNITY): Payer: Self-pay | Admitting: Emergency Medicine

## 2019-03-08 ENCOUNTER — Encounter (HOSPITAL_COMMUNITY)
Admission: RE | Disposition: A | Discharge status: Home / Self Care | Payer: Self-pay | Source: Home / Self Care | Attending: Urology

## 2019-03-08 ENCOUNTER — Ambulatory Visit (HOSPITAL_COMMUNITY): Payer: Medicare Other | Admitting: Anesthesiology

## 2019-03-08 ENCOUNTER — Ambulatory Visit (HOSPITAL_COMMUNITY)
Admission: RE | Admit: 2019-03-08 | Discharge: 2019-03-08 | Disposition: A | Discharge status: Home / Self Care | Payer: Medicare Other | Attending: Urology | Admitting: Urology

## 2019-03-08 ENCOUNTER — Ambulatory Visit (HOSPITAL_COMMUNITY): Payer: Medicare Other

## 2019-03-08 DIAGNOSIS — M1990 Unspecified osteoarthritis, unspecified site: Secondary | ICD-10-CM | POA: Diagnosis not present

## 2019-03-08 DIAGNOSIS — Z930 Tracheostomy status: Secondary | ICD-10-CM | POA: Insufficient documentation

## 2019-03-08 DIAGNOSIS — N323 Diverticulum of bladder: Secondary | ICD-10-CM | POA: Diagnosis not present

## 2019-03-08 DIAGNOSIS — N3289 Other specified disorders of bladder: Secondary | ICD-10-CM | POA: Diagnosis not present

## 2019-03-08 DIAGNOSIS — J449 Chronic obstructive pulmonary disease, unspecified: Secondary | ICD-10-CM | POA: Insufficient documentation

## 2019-03-08 DIAGNOSIS — J3802 Paralysis of vocal cords and larynx, bilateral: Secondary | ICD-10-CM | POA: Insufficient documentation

## 2019-03-08 DIAGNOSIS — I10 Essential (primary) hypertension: Secondary | ICD-10-CM | POA: Insufficient documentation

## 2019-03-08 DIAGNOSIS — N3010 Interstitial cystitis (chronic) without hematuria: Secondary | ICD-10-CM | POA: Diagnosis not present

## 2019-03-08 HISTORY — PX: CYSTOSCOPY W/ RETROGRADES: SHX1426

## 2019-03-08 HISTORY — PX: CYSTOSCOPY WITH BIOPSY: SHX5122

## 2019-03-08 SURGERY — CYSTOSCOPY, WITH RETROGRADE PYELOGRAM
Anesthesia: General | Site: Urethra

## 2019-03-08 MED ORDER — ONDANSETRON HCL 4 MG/2ML IJ SOLN
INTRAMUSCULAR | Status: AC
  Filled 2019-03-08: qty 2

## 2019-03-08 MED ORDER — ONDANSETRON HCL 4 MG/2ML IJ SOLN
INTRAMUSCULAR | Status: DC | PRN
  Administered 2019-03-08: 4 mg via INTRAVENOUS

## 2019-03-08 MED ORDER — FENTANYL CITRATE (PF) 100 MCG/2ML IJ SOLN
INTRAMUSCULAR | Status: DC | PRN
  Administered 2019-03-08: 25 ug via INTRAVENOUS
  Administered 2019-03-08: 50 ug via INTRAVENOUS
  Administered 2019-03-08: 25 ug via INTRAVENOUS

## 2019-03-08 MED ORDER — PROPOFOL 10 MG/ML IV BOLUS
INTRAVENOUS | Status: DC | PRN
  Administered 2019-03-08 (×2): 50 mg via INTRAVENOUS
  Administered 2019-03-08: 100 mg via INTRAVENOUS

## 2019-03-08 MED ORDER — ONDANSETRON HCL 4 MG/2ML IJ SOLN: 4.0000 mg | Freq: Once | INTRAMUSCULAR | Status: DC | PRN

## 2019-03-08 MED ORDER — MEPERIDINE HCL 50 MG/ML IJ SOLN: 6.2500 mg | INTRAMUSCULAR | Status: DC | PRN

## 2019-03-08 MED ORDER — PHENYLEPHRINE 40 MCG/ML (10ML) SYRINGE FOR IV PUSH (FOR BLOOD PRESSURE SUPPORT)
PREFILLED_SYRINGE | INTRAVENOUS | Status: AC
  Filled 2019-03-08: qty 10

## 2019-03-08 MED ORDER — PHENYLEPHRINE HCL (PRESSORS) 10 MG/ML IV SOLN
INTRAVENOUS | Status: DC | PRN
  Administered 2019-03-08 (×3): 80 ug via INTRAVENOUS

## 2019-03-08 MED ORDER — LACTATED RINGERS IV SOLN
INTRAVENOUS | Status: DC
  Administered 2019-03-08: 11:00:00 via INTRAVENOUS

## 2019-03-08 MED ORDER — SODIUM CHLORIDE 0.9 % IR SOLN
Status: DC | PRN
  Administered 2019-03-08: 3000 mL via INTRAVESICAL

## 2019-03-08 MED ORDER — IOHEXOL 300 MG/ML  SOLN
INTRAMUSCULAR | Status: DC | PRN
  Administered 2019-03-08: 13:00:00 16 mL via URETHRAL

## 2019-03-08 MED ORDER — PROPOFOL 10 MG/ML IV BOLUS
INTRAVENOUS | Status: AC
  Filled 2019-03-08: qty 20

## 2019-03-08 MED ORDER — TRAMADOL HCL 50 MG PO TABS: 50.0000 mg | ORAL_TABLET | Freq: Four times a day (QID) | ORAL | 0 refills | Status: DC | PRN

## 2019-03-08 MED ORDER — FENTANYL CITRATE (PF) 100 MCG/2ML IJ SOLN: 25.0000 ug | INTRAMUSCULAR | Status: DC | PRN

## 2019-03-08 MED ORDER — STERILE WATER FOR IRRIGATION IR SOLN
Status: DC | PRN
  Administered 2019-03-08: 3000 mL via INTRAVESICAL

## 2019-03-08 MED ORDER — DEXAMETHASONE SODIUM PHOSPHATE 10 MG/ML IJ SOLN
INTRAMUSCULAR | Status: DC | PRN
  Administered 2019-03-08: 10 mg via INTRAVENOUS

## 2019-03-08 MED ORDER — LIDOCAINE HCL (CARDIAC) PF 100 MG/5ML IV SOSY
PREFILLED_SYRINGE | INTRAVENOUS | Status: DC | PRN
  Administered 2019-03-08: 60 mg via INTRAVENOUS

## 2019-03-08 MED ORDER — DEXAMETHASONE SODIUM PHOSPHATE 10 MG/ML IJ SOLN
INTRAMUSCULAR | Status: AC
  Filled 2019-03-08: qty 1

## 2019-03-08 MED ORDER — FENTANYL CITRATE (PF) 100 MCG/2ML IJ SOLN
INTRAMUSCULAR | Status: AC
  Filled 2019-03-08: qty 2

## 2019-03-08 SURGICAL SUPPLY — 30 items
BAG URINE DRAINAGE (UROLOGICAL SUPPLIES) IMPLANT
BAG URO CATCHER STRL LF (MISCELLANEOUS) ×4 IMPLANT
BASKET LASER NITINOL 1.9FR (BASKET) IMPLANT
BSKT STON RTRVL 120 1.9FR (BASKET)
CATH INTERMIT  6FR 70CM (CATHETERS) ×4 IMPLANT
CLOTH BEACON ORANGE TIMEOUT ST (SAFETY) ×4 IMPLANT
COVER WAND RF STERILE (DRAPES) IMPLANT
ELECT REM PT RETURN 15FT ADLT (MISCELLANEOUS) ×4 IMPLANT
EVACUATOR MICROVAS BLADDER (UROLOGICAL SUPPLIES) IMPLANT
EXTRACTOR STONE 1.7FRX115CM (UROLOGICAL SUPPLIES) IMPLANT
FIBER LASER FLEXIVA 1000 (UROLOGICAL SUPPLIES) IMPLANT
FIBER LASER FLEXIVA 365 (UROLOGICAL SUPPLIES) IMPLANT
FIBER LASER FLEXIVA 550 (UROLOGICAL SUPPLIES) IMPLANT
FIBER LASER TRAC TIP (UROLOGICAL SUPPLIES) IMPLANT
GLOVE BIOGEL M STRL SZ7.5 (GLOVE) ×4 IMPLANT
GOWN STRL REUS W/TWL LRG LVL3 (GOWN DISPOSABLE) ×8 IMPLANT
GUIDEWIRE ANG ZIPWIRE 038X150 (WIRE) ×4 IMPLANT
GUIDEWIRE STR DUAL SENSOR (WIRE) ×4 IMPLANT
KIT TURNOVER KIT A (KITS) IMPLANT
LOOP CUT BIPOLAR 24F LRG (ELECTROSURGICAL) IMPLANT
MANIFOLD NEPTUNE II (INSTRUMENTS) ×4 IMPLANT
PACK CYSTO (CUSTOM PROCEDURE TRAY) ×4 IMPLANT
SET ASPIRATION TUBING (TUBING) IMPLANT
SHEATH URETERAL 12FRX28CM (UROLOGICAL SUPPLIES) IMPLANT
SHEATH URETERAL 12FRX35CM (MISCELLANEOUS) IMPLANT
SYRINGE IRR TOOMEY STRL 70CC (SYRINGE) IMPLANT
TUBE FEEDING 8FR 16IN STR KANG (MISCELLANEOUS) ×4 IMPLANT
TUBING CONNECTING 10 (TUBING) ×3 IMPLANT
TUBING CONNECTING 10' (TUBING) ×1
TUBING UROLOGY SET (TUBING) ×4 IMPLANT

## 2019-03-08 NOTE — Anesthesia Postprocedure Evaluation (Signed)
Anesthesia Post Note  Patient: [name] [name]  Procedure(s) Performed: CYSTOSCOPY WITH RETROGRADE PYELOGRAM (Bilateral Urethra) CYSTOSCOPY WITH BIOPSY WITH FUGERATION (N/A Urethra)     Patient location during evaluation: Phase II Anesthesia Type: General Level of consciousness: awake Pain management: pain level controlled Vital Signs Assessment: post-procedure vital signs reviewed and stable Respiratory status: spontaneous breathing Cardiovascular status: stable Postop Assessment: no apparent nausea or vomiting Anesthetic complications: no    Last Vitals:  Vitals:   03/08/19 1345 03/08/19 1415  BP: (!) 119/97 114/66  Pulse: 96 89  Resp: (!) 22 20  Temp:  36.8 C  SpO2: 91% 91%    Last Pain:  Vitals:   03/08/19 1415  TempSrc:   PainSc: 0-No pain   Pain Goal: Patients Stated Pain Goal: 4 (03/08/19 1100)                 Huston Foley

## 2019-03-08 NOTE — Anesthesia Procedure Notes (Signed)
Procedure Name: Intubation Date/Time: 03/08/2019 12:15 PM Performed by: Glory Buff, CRNA Pre-anesthesia Checklist: Patient identified, Emergency Drugs available, Suction available, Patient being monitored and Timeout performed Patient Re-evaluated:Patient Re-evaluated prior to induction Oxygen Delivery Method: Circle system utilized Preoxygenation: Pre-oxygenation with 100% oxygen Induction Type: IV induction Tube type: Reinforced Tube size: 6.5 mm Number of attempts: 1 Airway Equipment and Method: Oral airway Placement Confirmation: ETT inserted through vocal cords under direct vision,  positive ETCO2 and breath sounds checked- equal and bilateral Tube secured with: Tape Dental Injury: Teeth and Oropharynx as per pre-operative assessment  Comments: Patient preO2 via existing uncuffed trach, smooth IV induction, trach removed and # 6.5 reinforced ETT inserted by Dr Jenita Seashore, + ETCO2, BBS =, ETT suctioned for clear secretions, secured with pink tape.

## 2019-03-08 NOTE — Discharge Instructions (Signed)
1 - You may have urinary urgency (bladder spasms) and bloody urine on / off for up to 2 weeks. This is normal. No activity or diet restrictions.   2 - Call MD or go to ER for fever >102, severe pain / nausea / vomiting not relieved by medications, or acute change in medical status   General Anesthesia, Adult, Care After This sheet gives you information about how to care for yourself after your procedure. Your health care provider may also give you more specific instructions. If you have problems or questions, contact your health care provider. What can I expect after the procedure? After the procedure, the following side effects are common:  Pain or discomfort at the IV site.  Nausea.  Vomiting.  Sore throat.  Trouble concentrating.  Feeling cold or chills.  Weak or tired.  Sleepiness and fatigue.  Soreness and body aches. These side effects can affect parts of the body that were not involved in surgery. Follow these instructions at home:  For at least 24 hours after the procedure:  Have a responsible adult stay with you. It is important to have someone help care for you until you are awake and alert.  Rest as needed.  Do not: ? Participate in activities in which you could fall or become injured. ? Drive. ? Use heavy machinery. ? Drink alcohol. ? Take sleeping pills or medicines that cause drowsiness. ? Make important decisions or sign legal documents. ? Take care of children on your own. Eating and drinking  Follow any instructions from your health care provider about eating or drinking restrictions.  When you feel hungry, start by eating small amounts of foods that are soft and easy to digest (bland), such as toast. Gradually return to your regular diet.  Drink enough fluid to keep your urine pale yellow.  If you vomit, rehydrate by drinking water, juice, or clear broth. General instructions  If you have sleep apnea, surgery and certain medicines can increase  your risk for breathing problems. Follow instructions from your health care provider about wearing your sleep device: ? Anytime you are sleeping, including during daytime naps. ? While taking prescription pain medicines, sleeping medicines, or medicines that make you drowsy.  Return to your normal activities as told by your health care provider. Ask your health care provider what activities are safe for you.  Take over-the-counter and prescription medicines only as told by your health care provider.  If you smoke, do not smoke without supervision.  Keep all follow-up visits as told by your health care provider. This is important. Contact a health care provider if:  You have nausea or vomiting that does not get better with medicine.  You cannot eat or drink without vomiting.  You have pain that does not get better with medicine.  You are unable to pass urine.  You develop a skin rash.  You have a fever.  You have redness around your IV site that gets worse. Get help right away if:  You have difficulty breathing.  You have chest pain.  You have blood in your urine or stool, or you vomit blood. Summary  After the procedure, it is common to have a sore throat or nausea. It is also common to feel tired.  Have a responsible adult stay with you for the first 24 hours after general anesthesia. It is important to have someone help care for you until you are awake and alert.  When you feel hungry, start by eating  small amounts of foods that are soft and easy to digest (bland), such as toast. Gradually return to your regular diet.  Drink enough fluid to keep your urine pale yellow.  Return to your normal activities as told by your health care provider. Ask your health care provider what activities are safe for you. This information is not intended to replace advice given to you by your health care provider. Make sure you discuss any questions you have with your health care  provider. Document Released: 12/07/2000 Document Revised: 04/16/2017 Document Reviewed: 04/16/2017 Elsevier Interactive Patient Education  2019 [employer].

## 2019-03-08 NOTE — Transfer of Care (Signed)
Immediate Anesthesia Transfer of Care Note  Patient: [name] [name]  Procedure(s) Performed: CYSTOSCOPY WITH RETROGRADE PYELOGRAM (Bilateral Urethra) CYSTOSCOPY WITH BIOPSY WITH FUGERATION (N/A Urethra)  Patient Location: PACU  Anesthesia Type:General  Level of Consciousness: drowsy, patient cooperative and responds to stimulation  Airway & Oxygen Therapy: Patient Spontanous Breathing and Patient connected to face mask oxygen  Post-op Assessment: Report given to RN and Post -op Vital signs reviewed and stable  Post vital signs: Reviewed and stable  Last Vitals:  Vitals Value Taken Time  BP 106/68 03/08/19 1304  Temp    Pulse 84 03/08/19 1305  Resp 21 03/08/19 1305  SpO2 100 % 03/08/19 1305  Vitals shown include unvalidated device data.  Last Pain:  Vitals:   03/08/19 1100  TempSrc:   PainSc: 0-No pain      Patients Stated Pain Goal: 4 (19/16/60 6004)  Complications: No apparent anesthesia complications

## 2019-03-08 NOTE — Brief Op Note (Signed)
03/08/2019  12:45 PM  PATIENT:  [name] [name]  68 y.o. male  PRE-OPERATIVE DIAGNOSIS:  BLADDER DIVERTICULUM, RULE OUT BLADDER CANCER  POST-OPERATIVE DIAGNOSIS:  BLADDER DIVERTICULUM, RULE OUT BLADDER CANCER  PROCEDURE:  Procedure(s): CYSTOSCOPY WITH RETROGRADE PYELOGRAM (Bilateral) CYSTOSCOPY WITH BIOPSY WITH FUGERATION (N/A)  SURGEON:  Surgeon(s) and Role:    * Alexis Frock, MD - Primary  PHYSICIAN ASSISTANT:   ASSISTANTS: none   ANESTHESIA:   general  EBL:  minimal   BLOOD ADMINISTERED:none  DRAINS: none   LOCAL MEDICATIONS USED:  NONE  SPECIMEN:  Source of Specimen:  bladder dome diverticulum erythema  DISPOSITION OF SPECIMEN:  PATHOLOGY  COUNTS:  YES  TOURNIQUET:  * No tourniquets in log *  DICTATION: .Other Dictation: Dictation Number [id number]  PLAN OF CARE: Discharge to home after PACU  PATIENT DISPOSITION:  PACU - hemodynamically stable.   Delay start of Pharmacological VTE agent (>24hrs) due to surgical blood loss or risk of bleeding: yes

## 2019-03-08 NOTE — H&P (Signed)
Urology Admission H&P  Chief Complaint: Pre-OP Bladder Biopsy with Bilateral retrogrades  History of Present Illness:   1 - Bladder Dome Diverticulum With Inflammation / Rule Out Malignancy - Rt dome diverrticulum with perivesicle neovascularity and mucosal erythema on microscopic hematuria eval 2020. NO h/o recurrent UTI. PVR 131mL.   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 His PCP is Ladona Ridgel MD with [facility].   Today " [name] [name] " is seen for bladder biopsy to rule out malignancy in blaadder diverticulum.    Past Medical History:  Diagnosis Date  . Abnormal EKG 12/24/11   anteroseptal and lateral ST elevation, felt r/t early repolarization;  Cardiac cath 12/24/11 - normal coronary anatomy, EF 55-65%  . Anxiety   . Arthritis   . Asthma   . Bradycardia, sinus 12/24/11  . COPD (chronic obstructive pulmonary disease) ([place])   . Dyspnea   . H/O tracheostomy   . History of alcohol abuse    hospitalized for detox 2002  . HTN (hypertension)   . Hypotension 12/24/11   in the setting of dehydration   . Marijuana use   . Pneumonia   . Syncope and collapse 12/24/11   2/2 hypotension in the setting of dehydration  . Upper airway cough syndrome    Past Surgical History:  Procedure Laterality Date  . CARDIAC CATHETERIZATION  12/24/11   normal coronary anatomy, EF 55-65%  . CIRCUMCISION  1972  . COLONOSCOPY WITH PROPOFOL N/A 02/24/2018   Procedure: COLONOSCOPY WITH PROPOFOL;  Surgeon: Otis Brace, MD;  [facility];  Service: Gastroenterology;  Laterality: N/A;  . ESOPHAGOGASTRODUODENOSCOPY N/A 10/09/2015   Procedure: ESOPHAGOGASTRODUODENOSCOPY (EGD);  Surgeon: Judeth Horn, MD;  Location: [facility] ENDOSCOPY;  Service: General;  Laterality: N/A;  . IRRIGATION AND DEBRIDEMENT KNEE Right 05/23/2017   Procedure: IRRIGATION AND DEBRIDEMENT KNEE;  Surgeon: Wylene Simmer, MD;  [facility];  Service:  Orthopedics;  Laterality: Right;  . LACERATION REPAIR  02/2004   arthroscopic debridement of triagular fibrocartilage tear/E-chart; right wrist  . LEFT HEART CATHETERIZATION WITH CORONARY ANGIOGRAM N/A 12/24/2011   Procedure: LEFT HEART CATHETERIZATION WITH CORONARY ANGIOGRAM;  Surgeon: Peter M Martinique, MD;  Location: [facility] CATH LAB;  Service: Cardiovascular;  Laterality: N/A;  . PEG PLACEMENT N/A 10/09/2015   Procedure: PERCUTANEOUS ENDOSCOPIC GASTROSTOMY (PEG) PLACEMENT;  Surgeon: Judeth Horn, MD;  Location: [place];  Service: General;  Laterality: N/A;  . PEG TUBE REMOVAL    . PERCUTANEOUS TRACHEOSTOMY N/A 10/09/2015   Procedure: PERCUTANEOUS TRACHEOSTOMY;  Surgeon: Judeth Horn, MD;  Location: [place];  Service: General;  Laterality: N/A;  . POLYPECTOMY  02/24/2018   Procedure: POLYPECTOMY;  Surgeon: Otis Brace, MD;  [facility];  Service: Gastroenterology;;  . SINUS ENDO WITH FUSION  06/07/2018  . SINUS ENDO WITH FUSION Bilateral 06/07/2018   Procedure: SINUS ENDO WITH FUSION;  Surgeon: Melissa Montane, MD;  Location: [place];  Service: ENT;  Laterality: Bilateral;  with fuision    Home Medications:  Current Facility-Administered Medications  Medication Dose Route Frequency Provider Last Rate Last Dose  . gentamicin (GARAMYCIN) 330 mg in dextrose 5 % 100 mL IVPB  5 mg/kg Intravenous 30 min Pre-Op Alexis Frock, MD      . lactated ringers infusion   Intravenous Continuous Lillia Abed, MD 50 mL/hr at 03/08/19 1102     Allergies: No Known Allergies  Family History  Problem Relation Age of Onset  . COPD Sister   .  Cancer Sister   . Asthma Other    Social History:  reports that he has never smoked. He has never used smokeless tobacco. He reports current alcohol use of about 1.0 standard drinks of alcohol per week. He reports previous drug use. Drug: Marijuana.  Review of Systems  Constitutional: Negative for chills and fever.  All other systems reviewed and are  negative.   Physical Exam:  Vital signs in last 24 hours: Temp:  [98.6 F (37 C)] 98.6 F (37 C) (06/24 1014) Pulse Rate:  [96] 96 (06/24 1014) Resp:  [20] 20 (06/24 1014) BP: (111)/(75) 111/75 (06/24 1014) SpO2:  [92 %-94 %] 94 % (06/24 1100) Weight:  [65.4 kg] 65.4 kg (06/24 1100) Physical Exam  Constitutional: He appears well-developed.  HENT:  Head: Normocephalic.  Eyes: Pupils are equal, round, and reactive to light.  GI:  NO CVAT    Laboratory Data:  No results found for this or any previous visit (from the past 24 hour(s)). Recent Results (from the past 240 hour(s))  SARS Coronavirus 2 (Performed in [place] hospital lab)     Status: None   Collection Time: 03/04/19  3:52 PM   Specimen: Nasal Swab  Result Value Ref Range Status   SARS Coronavirus 2 NEGATIVE NEGATIVE Final    Comment: (NOTE) SARS-CoV-2 target nucleic acids are NOT DETECTED. The SARS-CoV-2 RNA is generally detectable in upper and lower respiratory specimens during the acute phase of infection. Negative results do not preclude SARS-CoV-2 infection, do not rule out co-infections with other pathogens, and should not be used as the sole basis for treatment or other patient management decisions. Negative results must be combined with clinical observations, patient history, and epidemiological information. The expected result is Negative. Fact Sheet for Patients: TrashEliminator.se Fact Sheet for Healthcare Providers: WhoisBlogging.ch This test is not yet approved or cleared by the [place] FDA and  has been authorized for detection and/or diagnosis of SARS-CoV-2 by FDA under an Emergency Use Authorization (EUA). This EUA will remain  in effect (meaning this test can be used) for the duration of the COVID-19 declaration under Section 56 4(b)(1) of the Act, 21 U.S.C. section 360bbb-3(b)(1), unless the authorization is terminated or revoked  sooner. Performed at [facility], [place] [address] [place],  [address]      Plan:   Proceed as planend with cystoscopy / bladder biopsy and bilateral retrogrades. Risks, benefits, peri-op course including need for possible temporary foley discussed previosly and reiterated today.   Alexis Frock 03/08/2019, 11:41 AM

## 2019-03-09 ENCOUNTER — Encounter (HOSPITAL_COMMUNITY): Payer: Self-pay | Admitting: Urology

## 2019-03-09 NOTE — Op Note (Signed)
[name], [name] MEDICAL RECORD [id number] ACCOUNT [id number] DATE OF BIRTH:Nov 06, 1950 FACILITY[facility] LOCATION: [facility] PHYSICIAN:Anant Agard, MD  OPERATIVE REPORT  DATE OF PROCEDURE:  03/08/2019  PREOPERATIVE DIAGNOSIS:  Bladder dome diverticulum with erythema.  POSTOPERATIVE DIAGNOSIS:  Bladder dome diverticulum with erythema.  PROCEDURE: 1.  Cystoscopy with bladder biopsy and fulguration. 2.  Bilateral retrograde  pyelograms.  SURGEON:  Alexis Frock, MD  ESTIMATED BLOOD LOSS:  Nil.  COMPLICATIONS:  None.  SPECIMENS:  Bladder dome diverticular erythema for permanent pathology.  FINDINGS: 1.  Bladder dome diverticulum.  Estimated 75 mL volume with erythema.  2.  Successful biopsy of the bladder dome erythema without perforation. 3.  Unremarkable bilateral retrograde pyelograms.  INDICATIONS:  The patient is a comorbid 68 year old gentleman who is very pleasant.  He was found on workup of recurrent urinary infections and microscopic hematuria to have a bladder dome diverticulum with some associated erythema that is somewhat  concerning for possible carcinoma in situ within this.  Options were discussed for management including recommended path of operative biopsy with fulguration.  He wished to proceed.  Informed consent was obtained and placed in the medical record.  PROCEDURE IN DETAIL:  The patient being identified and procedure being cystoscopy with retrograde and bladder biopsy was confirmed.   General endotracheal anesthesia was introduced via his tracheostomy tract.  The patient was placed into a low  lithotomy position.  A sterile field was created by prepping the patient's penis, perineum and proximal thighs using iodine.  Cystourethroscopy was performed with a 21-French rigid cystoscope with offset lens.  Inspection of anterior and posterior  urethra was unremarkable.  Inspection of the bladder revealed no papillary lesions.  There was a diverticulum at  the dome approximately 1 cm neck.  Estimated volume of diverticulum approximately 75-100 mL.  There was some erythema within this without  obvious papillary changes.  Attention was directed at retrograde pyelograms.  The left ureteral orifice was cannulated with a 6-French renal catheter and left retrograde pyelogram was obtained.  Left retrograde pyelogram demonstrated a single left ureter, single-system left kidney.  No filling defects or narrowing noted.  Similarly, right retrograde pyelogram was obtained.  Right retrograde pyelogram demonstrated a single right ureter, single-system right kidney.  No filling defects or narrowing noted.  Next, using small-caliber flexible cystoscopic biopsy forceps, representative mucosal biopsies were obtained of the  bladder dome erythema taking exquisite care to avoid perforation, which did not occur.  This was set aside and labeled as such.  Next, a single additional cold cup biopsy was used using rigid cystoscopic grasper which obtained a somewhat larger specimen.   Again, I was quite happy with the amount of tissue and avoidance of perforation of a sensitive area.  Next, a Bugbee electrode was used to fulgurate the base of this which resulted in excellent hemostasis.  Bladder was partially empty per cystoscope.   The procedure was terminated.  The patient tolerated the procedure well.  No immediate perioperative complications.  The patient was taken to postanesthesia care in stable condition with plan for discharge home.  LN/NUANCE  D:03/08/2019 T:03/08/2019 JOB:[id number]

## 2019-03-21 DIAGNOSIS — K429 Umbilical hernia without obstruction or gangrene: Secondary | ICD-10-CM | POA: Diagnosis not present

## 2019-03-21 DIAGNOSIS — R3121 Asymptomatic microscopic hematuria: Secondary | ICD-10-CM | POA: Diagnosis not present

## 2019-03-21 DIAGNOSIS — N323 Diverticulum of bladder: Secondary | ICD-10-CM | POA: Diagnosis not present

## 2019-03-23 DIAGNOSIS — J324 Chronic pansinusitis: Secondary | ICD-10-CM | POA: Diagnosis not present

## 2019-03-28 DIAGNOSIS — Z430 Encounter for attention to tracheostomy: Secondary | ICD-10-CM | POA: Diagnosis not present

## 2019-03-28 DIAGNOSIS — J3802 Paralysis of vocal cords and larynx, bilateral: Secondary | ICD-10-CM | POA: Diagnosis not present

## 2019-04-05 DIAGNOSIS — D141 Benign neoplasm of larynx: Secondary | ICD-10-CM | POA: Diagnosis not present

## 2019-04-05 DIAGNOSIS — R490 Dysphonia: Secondary | ICD-10-CM | POA: Diagnosis not present

## 2019-04-05 DIAGNOSIS — J386 Stenosis of larynx: Secondary | ICD-10-CM | POA: Diagnosis not present

## 2019-04-05 DIAGNOSIS — J3802 Paralysis of vocal cords and larynx, bilateral: Secondary | ICD-10-CM | POA: Diagnosis not present

## 2019-04-05 DIAGNOSIS — Z430 Encounter for attention to tracheostomy: Secondary | ICD-10-CM | POA: Diagnosis not present

## 2019-04-05 DIAGNOSIS — R0600 Dyspnea, unspecified: Secondary | ICD-10-CM | POA: Diagnosis not present

## 2019-04-05 DIAGNOSIS — Z930 Tracheostomy status: Secondary | ICD-10-CM | POA: Diagnosis not present

## 2019-04-06 DIAGNOSIS — J3800 Paralysis of vocal cords and larynx, unspecified: Secondary | ICD-10-CM | POA: Diagnosis not present

## 2019-04-06 DIAGNOSIS — J449 Chronic obstructive pulmonary disease, unspecified: Secondary | ICD-10-CM | POA: Diagnosis not present

## 2019-04-06 DIAGNOSIS — R0982 Postnasal drip: Secondary | ICD-10-CM | POA: Diagnosis not present

## 2019-04-20 DIAGNOSIS — J324 Chronic pansinusitis: Secondary | ICD-10-CM | POA: Diagnosis not present

## 2019-04-21 DIAGNOSIS — R600 Localized edema: Secondary | ICD-10-CM | POA: Diagnosis not present

## 2019-04-21 DIAGNOSIS — I10 Essential (primary) hypertension: Secondary | ICD-10-CM | POA: Diagnosis not present

## 2019-04-21 DIAGNOSIS — R5383 Other fatigue: Secondary | ICD-10-CM | POA: Diagnosis not present

## 2019-04-25 DIAGNOSIS — J3802 Paralysis of vocal cords and larynx, bilateral: Secondary | ICD-10-CM | POA: Diagnosis not present

## 2019-04-25 DIAGNOSIS — Z430 Encounter for attention to tracheostomy: Secondary | ICD-10-CM | POA: Diagnosis not present

## 2019-05-07 DIAGNOSIS — J3800 Paralysis of vocal cords and larynx, unspecified: Secondary | ICD-10-CM | POA: Diagnosis not present

## 2019-05-07 DIAGNOSIS — R0982 Postnasal drip: Secondary | ICD-10-CM | POA: Diagnosis not present

## 2019-05-07 DIAGNOSIS — J449 Chronic obstructive pulmonary disease, unspecified: Secondary | ICD-10-CM | POA: Diagnosis not present

## 2019-05-09 ENCOUNTER — Telehealth: Payer: Self-pay | Admitting: Internal Medicine

## 2019-05-09 ENCOUNTER — Other Ambulatory Visit: Payer: Self-pay | Admitting: Internal Medicine

## 2019-05-09 DIAGNOSIS — R05 Cough: Secondary | ICD-10-CM

## 2019-05-09 DIAGNOSIS — R058 Other specified cough: Secondary | ICD-10-CM

## 2019-05-09 MED ORDER — PANTOPRAZOLE SODIUM 40 MG PO TBEC: DELAYED_RELEASE_TABLET | ORAL | 3 refills | Status: DC

## 2019-05-09 NOTE — Telephone Encounter (Signed)
LVM for Christa to advise we do not have that equipment here and she will need to check within [facility] as we do not have those supplies available in our office and we are not under the [facility].   Nothing further needed at this time.

## 2019-05-15 DIAGNOSIS — J3802 Paralysis of vocal cords and larynx, bilateral: Secondary | ICD-10-CM | POA: Diagnosis not present

## 2019-05-15 DIAGNOSIS — J386 Stenosis of larynx: Secondary | ICD-10-CM | POA: Diagnosis not present

## 2019-05-15 DIAGNOSIS — Z930 Tracheostomy status: Secondary | ICD-10-CM | POA: Diagnosis not present

## 2019-05-15 DIAGNOSIS — G4733 Obstructive sleep apnea (adult) (pediatric): Secondary | ICD-10-CM | POA: Diagnosis not present

## 2019-05-17 DIAGNOSIS — Z930 Tracheostomy status: Secondary | ICD-10-CM | POA: Diagnosis not present

## 2019-05-17 DIAGNOSIS — Z430 Encounter for attention to tracheostomy: Secondary | ICD-10-CM | POA: Diagnosis not present

## 2019-05-17 IMAGING — DX DG CHEST 2V
2 series · 2 of 2 positions shown · non-contrast
Comparison: 05/22/2017

CLINICAL DATA: Cough, sneezing

EXAM:
CHEST  2 VIEW

[w chest lat]
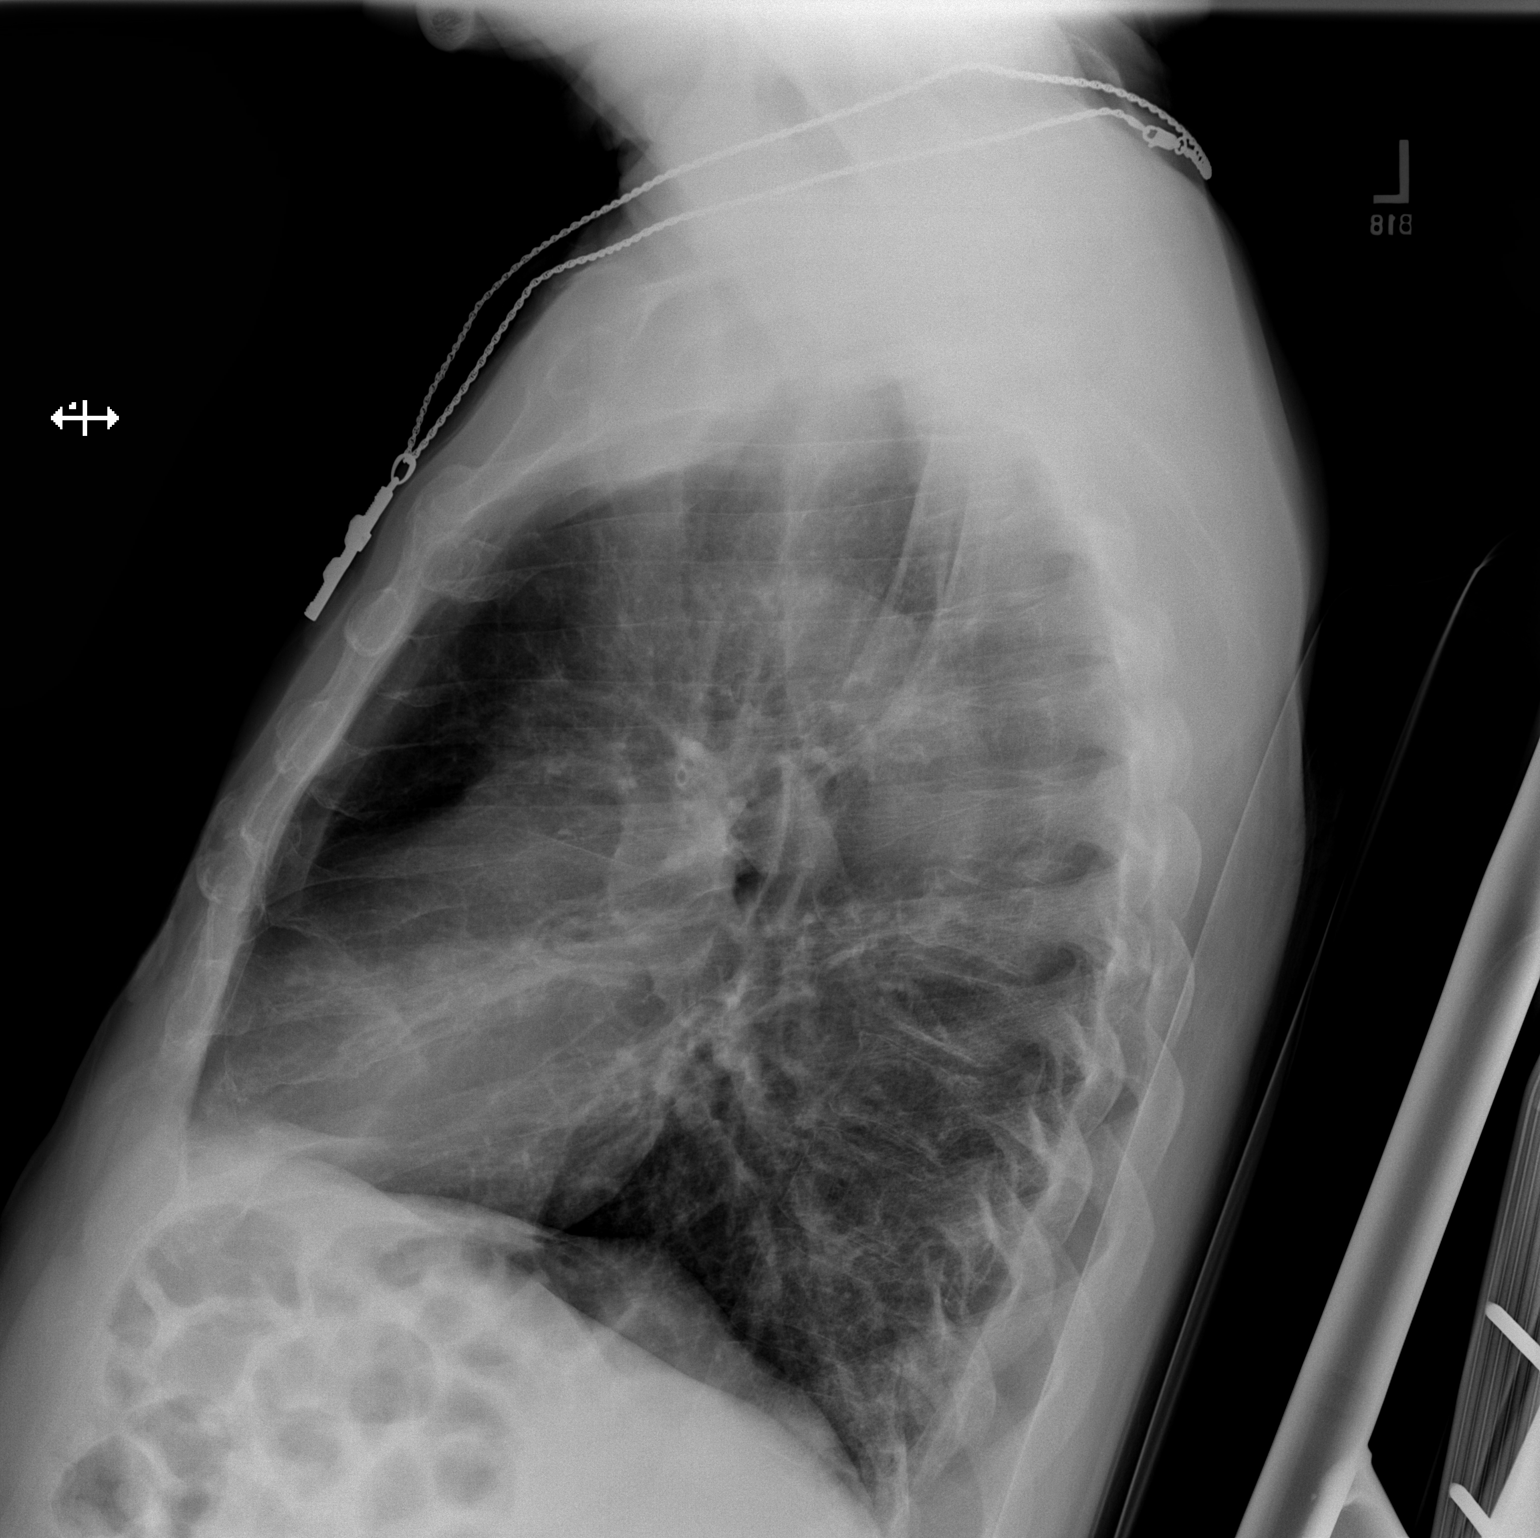

[w chest pa]
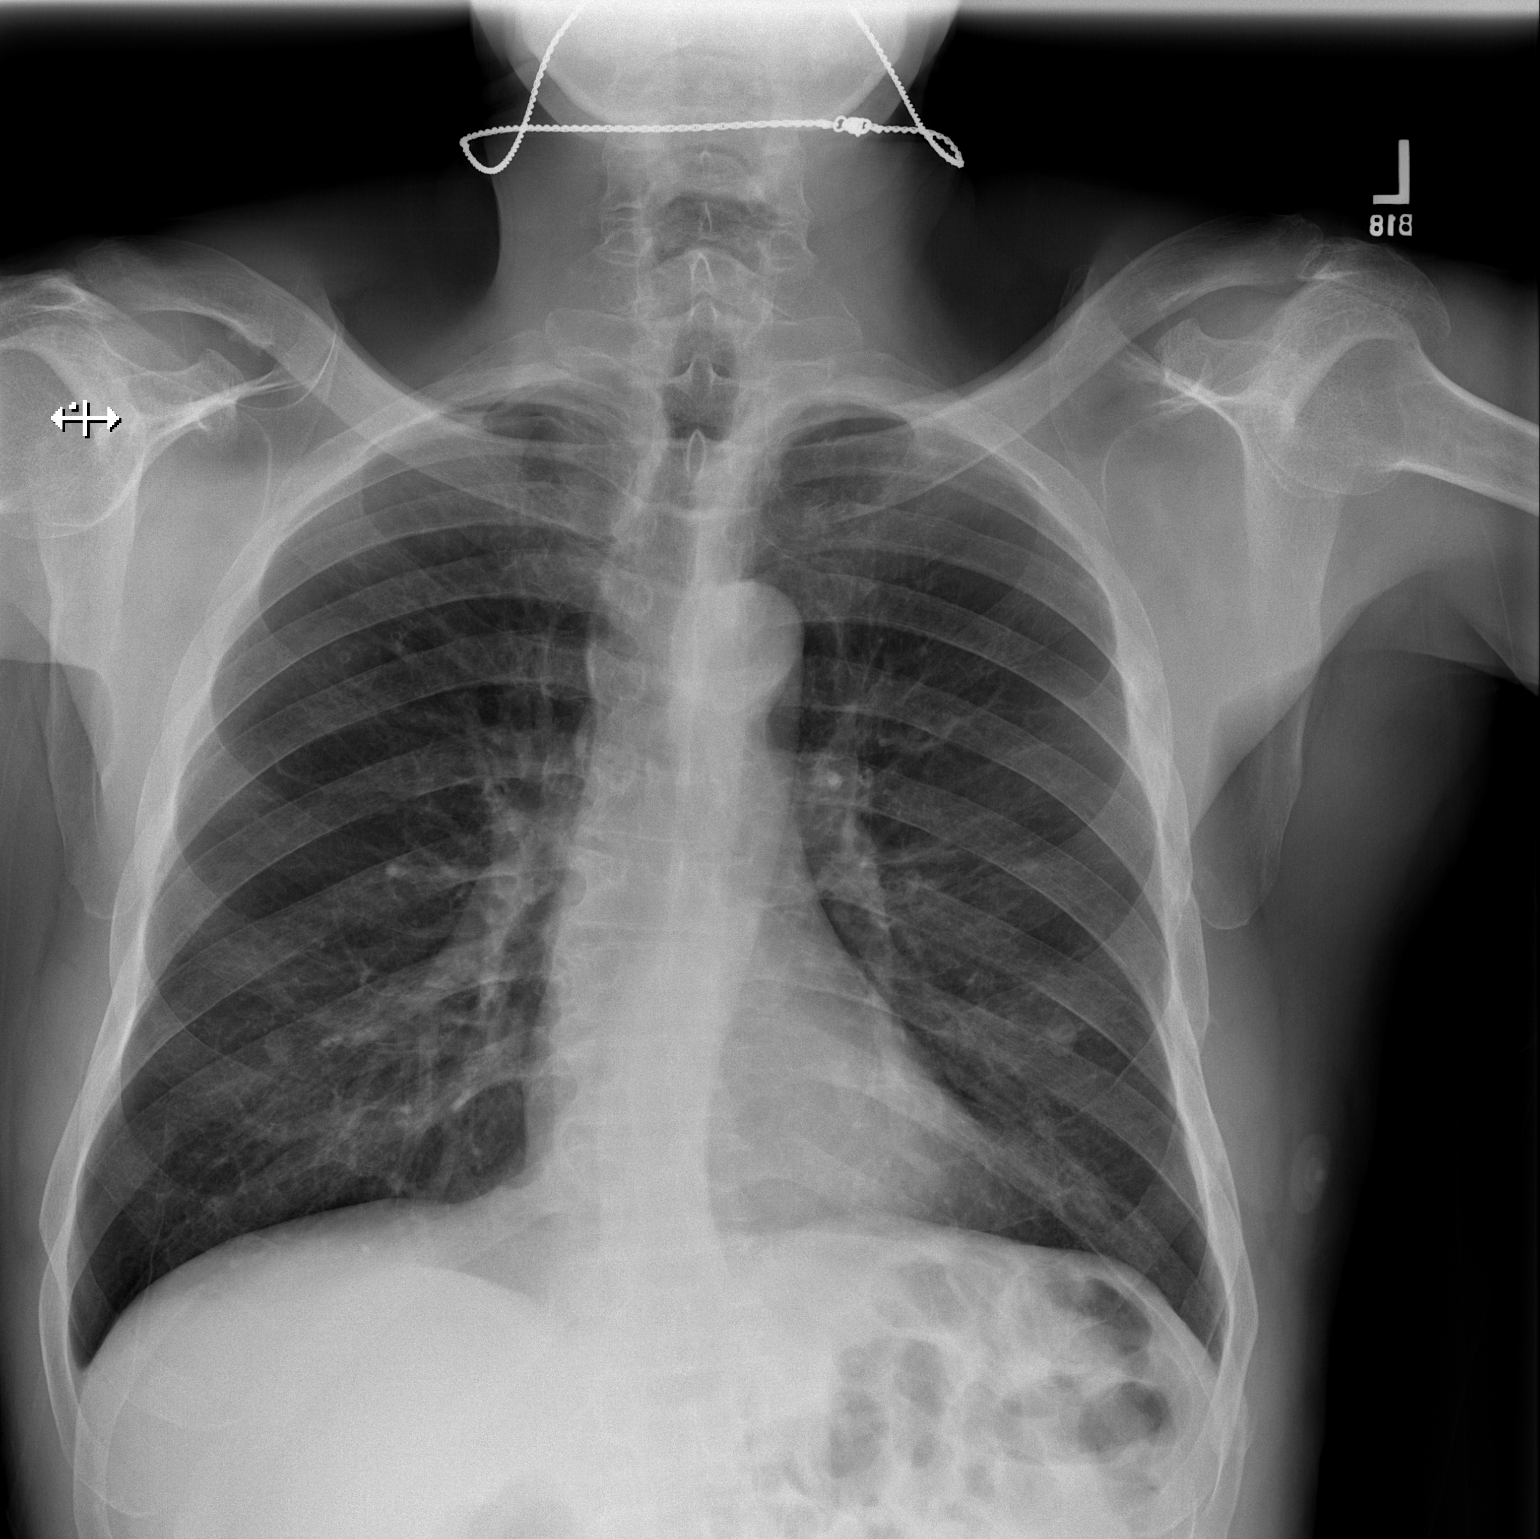

[2 of 2 positions shown; findings below may reference images not displayed]

FINDINGS: Heart and mediastinal contours are within normal limits. No focal
opacities or effusions. No acute bony abnormality.
IMPRESSION: No active cardiopulmonary disease.

## 2019-05-20 ENCOUNTER — Emergency Department (HOSPITAL_COMMUNITY)
Admission: EM | Admit: 2019-05-20 | Discharge: 2019-05-20 | Disposition: A | Discharge status: Home / Self Care | Payer: No Typology Code available for payment source | Attending: Emergency Medicine | Admitting: Emergency Medicine

## 2019-05-20 ENCOUNTER — Encounter (HOSPITAL_COMMUNITY): Payer: Self-pay | Admitting: Emergency Medicine

## 2019-05-20 ENCOUNTER — Other Ambulatory Visit: Payer: Self-pay

## 2019-05-20 ENCOUNTER — Emergency Department (HOSPITAL_COMMUNITY): Payer: No Typology Code available for payment source

## 2019-05-20 DIAGNOSIS — I10 Essential (primary) hypertension: Secondary | ICD-10-CM | POA: Insufficient documentation

## 2019-05-20 DIAGNOSIS — Z79899 Other long term (current) drug therapy: Secondary | ICD-10-CM | POA: Diagnosis not present

## 2019-05-20 DIAGNOSIS — J449 Chronic obstructive pulmonary disease, unspecified: Secondary | ICD-10-CM | POA: Insufficient documentation

## 2019-05-20 DIAGNOSIS — Z20828 Contact with and (suspected) exposure to other viral communicable diseases: Secondary | ICD-10-CM | POA: Insufficient documentation

## 2019-05-20 DIAGNOSIS — I259 Chronic ischemic heart disease, unspecified: Secondary | ICD-10-CM | POA: Insufficient documentation

## 2019-05-20 DIAGNOSIS — J45909 Unspecified asthma, uncomplicated: Secondary | ICD-10-CM | POA: Insufficient documentation

## 2019-05-20 DIAGNOSIS — R0689 Other abnormalities of breathing: Secondary | ICD-10-CM | POA: Diagnosis not present

## 2019-05-20 DIAGNOSIS — J189 Pneumonia, unspecified organism: Secondary | ICD-10-CM | POA: Diagnosis not present

## 2019-05-20 DIAGNOSIS — R0603 Acute respiratory distress: Secondary | ICD-10-CM | POA: Insufficient documentation

## 2019-05-20 DIAGNOSIS — R0602 Shortness of breath: Secondary | ICD-10-CM | POA: Diagnosis not present

## 2019-05-20 DIAGNOSIS — I959 Hypotension, unspecified: Secondary | ICD-10-CM | POA: Diagnosis not present

## 2019-05-20 LAB — CBC WITH DIFFERENTIAL/PLATELET
Abs Immature Granulocytes: 0.03 10*3/uL (ref 0.00–0.07)
Basophils Absolute: 0 10*3/uL (ref 0.0–0.1)
Basophils Relative: 1 %
Eosinophils Absolute: 0.7 10*3/uL — ABNORMAL HIGH (ref 0.0–0.5)
Eosinophils Relative: 11 %
HCT: 37.4 % — ABNORMAL LOW (ref 39.0–52.0)
Hemoglobin: 12.4 g/dL — ABNORMAL LOW (ref 13.0–17.0)
Immature Granulocytes: 0 %
Lymphocytes Relative: 16 %
Lymphs Abs: 1.1 10*3/uL (ref 0.7–4.0)
MCH: 29.1 pg (ref 26.0–34.0)
MCHC: 33.2 g/dL (ref 30.0–36.0)
MCV: 87.8 fL (ref 80.0–100.0)
Monocytes Absolute: 0.4 10*3/uL (ref 0.1–1.0)
Monocytes Relative: 6 %
Neutro Abs: 4.6 10*3/uL (ref 1.7–7.7)
Neutrophils Relative %: 66 %
Platelets: 302 10*3/uL (ref 150–400)
RBC: 4.26 MIL/uL (ref 4.22–5.81)
RDW: 15 % (ref 11.5–15.5)
WBC: 6.8 10*3/uL (ref 4.0–10.5)
nRBC: 0 % (ref 0.0–0.2)

## 2019-05-20 LAB — BASIC METABOLIC PANEL
Anion gap: 9 (ref 5–15)
BUN: 9 mg/dL (ref 8–23)
CO2: 25 mmol/L (ref 22–32)
Calcium: 8.8 mg/dL — ABNORMAL LOW (ref 8.9–10.3)
Chloride: 103 mmol/L (ref 98–111)
Creatinine, Ser: 0.82 mg/dL (ref 0.61–1.24)
GFR calc Af Amer: >60 mL/min (ref >60)
GFR calc non Af Amer: >60 mL/min (ref >60)
Glucose, Bld: 120 mg/dL — ABNORMAL HIGH (ref 70–99)
Potassium: 3.9 mmol/L (ref 3.5–5.1)
Sodium: 137 mmol/L (ref 135–145)

## 2019-05-20 LAB — SARS CORONAVIRUS 2 BY RT PCR (HOSPITAL ORDER, PERFORMED IN ~~LOC~~ HOSPITAL LAB): SARS Coronavirus 2: NEGATIVE

## 2019-05-20 LAB — BRAIN NATRIURETIC PEPTIDE: B Natriuretic Peptide: 18.3 pg/mL (ref 0.0–100.0)

## 2019-05-20 MED ORDER — ALBUTEROL SULFATE HFA 108 (90 BASE) MCG/ACT IN AERS: 4.0000 | INHALATION_SPRAY | RESPIRATORY_TRACT | Status: DC | PRN

## 2019-05-20 MED ORDER — AMOXICILLIN 500 MG PO CAPS: 500.0000 mg | ORAL_CAPSULE | Freq: Three times a day (TID) | ORAL | 0 refills | Status: DC

## 2019-05-20 MED ORDER — ALBUTEROL SULFATE HFA 108 (90 BASE) MCG/ACT IN AERS
8.0000 | INHALATION_SPRAY | Freq: Once | RESPIRATORY_TRACT | Status: AC
  Administered 2019-05-20: 8 via RESPIRATORY_TRACT

## 2019-05-20 MED ORDER — ACETAMINOPHEN 325 MG PO TABS
650.0000 mg | ORAL_TABLET | Freq: Once | ORAL | Status: AC
  Administered 2019-05-20: 650 mg via ORAL
  Filled 2019-05-20: qty 2

## 2019-05-20 MED ORDER — AZITHROMYCIN 250 MG PO TABS: ORAL_TABLET | ORAL | 0 refills | Status: DC

## 2019-05-20 NOTE — ED Notes (Signed)
Ambulated Pt on Room Air pts O2 stayed around 95%-97%.

## 2019-05-20 NOTE — Discharge Instructions (Addendum)
Take antibiotics as treatment for potential pneumonia causing difficulty breathing. Follow up with your doctor for further care.  Return if you have any concerns.

## 2019-05-20 NOTE — ED Notes (Signed)
Pt given dc instructions pt verbalizes understanding.  

## 2019-05-20 NOTE — ED Triage Notes (Signed)
Pt here for SOB. Pt has a trach. Hx of COPD. Increased resp distress for olast 8 hrs. Wheezing. Increased fluid in stoma. 94% with ems. Em,s gave 10L thru stoma and pt is 100%  Ems gave .3 epi IM 125 solumedrol IV.  EMS suctioned pt twice.
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
## 2019-05-22 ENCOUNTER — Emergency Department (HOSPITAL_COMMUNITY)
Admission: EM | Admit: 2019-05-22 | Discharge: 2019-05-22 | Disposition: A | Discharge status: Home / Self Care | Payer: No Typology Code available for payment source | Attending: Emergency Medicine | Admitting: Emergency Medicine

## 2019-05-22 ENCOUNTER — Other Ambulatory Visit: Payer: Self-pay

## 2019-05-22 ENCOUNTER — Emergency Department (HOSPITAL_COMMUNITY): Payer: No Typology Code available for payment source

## 2019-05-22 DIAGNOSIS — R0602 Shortness of breath: Secondary | ICD-10-CM | POA: Diagnosis present

## 2019-05-22 DIAGNOSIS — I10 Essential (primary) hypertension: Secondary | ICD-10-CM | POA: Diagnosis not present

## 2019-05-22 DIAGNOSIS — J45909 Unspecified asthma, uncomplicated: Secondary | ICD-10-CM | POA: Diagnosis not present

## 2019-05-22 DIAGNOSIS — Z930 Tracheostomy status: Secondary | ICD-10-CM | POA: Diagnosis not present

## 2019-05-22 DIAGNOSIS — J441 Chronic obstructive pulmonary disease with (acute) exacerbation: Secondary | ICD-10-CM | POA: Insufficient documentation

## 2019-05-22 DIAGNOSIS — Z79899 Other long term (current) drug therapy: Secondary | ICD-10-CM | POA: Diagnosis not present

## 2019-05-22 LAB — CBC WITH DIFFERENTIAL/PLATELET
Abs Immature Granulocytes: 0.01 10*3/uL (ref 0.00–0.07)
Basophils Absolute: 0.1 10*3/uL (ref 0.0–0.1)
Basophils Relative: 1 %
Eosinophils Absolute: 0.7 10*3/uL — ABNORMAL HIGH (ref 0.0–0.5)
Eosinophils Relative: 13 %
HCT: 40.5 % (ref 39.0–52.0)
Hemoglobin: 13.2 g/dL (ref 13.0–17.0)
Immature Granulocytes: 0 %
Lymphocytes Relative: 34 %
Lymphs Abs: 1.9 10*3/uL (ref 0.7–4.0)
MCH: 29.3 pg (ref 26.0–34.0)
MCHC: 32.6 g/dL (ref 30.0–36.0)
MCV: 90 fL (ref 80.0–100.0)
Monocytes Absolute: 0.8 10*3/uL (ref 0.1–1.0)
Monocytes Relative: 14 %
Neutro Abs: 2.2 10*3/uL (ref 1.7–7.7)
Neutrophils Relative %: 38 %
Platelets: 311 10*3/uL (ref 150–400)
RBC: 4.5 MIL/uL (ref 4.22–5.81)
RDW: 15.2 % (ref 11.5–15.5)
WBC: 5.7 10*3/uL (ref 4.0–10.5)
nRBC: 0 % (ref 0.0–0.2)

## 2019-05-22 LAB — BASIC METABOLIC PANEL
Anion gap: 10 (ref 5–15)
BUN: 10 mg/dL (ref 8–23)
CO2: 23 mmol/L (ref 22–32)
Calcium: 9.1 mg/dL (ref 8.9–10.3)
Chloride: 104 mmol/L (ref 98–111)
Creatinine, Ser: 0.98 mg/dL (ref 0.61–1.24)
GFR calc Af Amer: >60 mL/min (ref >60)
GFR calc non Af Amer: >60 mL/min (ref >60)
Glucose, Bld: 96 mg/dL (ref 70–99)
Potassium: 3.7 mmol/L (ref 3.5–5.1)
Sodium: 137 mmol/L (ref 135–145)

## 2019-05-22 LAB — LACTIC ACID, PLASMA: Lactic Acid, Venous: 1.1 mmol/L (ref 0.5–1.9)

## 2019-05-22 MED ORDER — METHYLPREDNISOLONE SODIUM SUCC 125 MG IJ SOLR
125.0000 mg | Freq: Once | INTRAMUSCULAR | Status: AC
  Administered 2019-05-22: 125 mg via INTRAVENOUS
  Filled 2019-05-22: qty 2

## 2019-05-22 MED ORDER — IPRATROPIUM BROMIDE 0.02 % IN SOLN
0.5000 mg | Freq: Once | RESPIRATORY_TRACT | Status: AC
  Administered 2019-05-22: 11:00:00 0.5 mg via RESPIRATORY_TRACT
  Filled 2019-05-22: qty 2.5

## 2019-05-22 MED ORDER — IPRATROPIUM-ALBUTEROL 0.5-2.5 (3) MG/3ML IN SOLN
RESPIRATORY_TRACT | Status: AC
  Administered 2019-05-22: 09:00:00
  Filled 2019-05-22: qty 3

## 2019-05-22 MED ORDER — IPRATROPIUM-ALBUTEROL 0.5-2.5 (3) MG/3ML IN SOLN
3.0000 mL | Freq: Four times a day (QID) | RESPIRATORY_TRACT | Status: DC
  Administered 2019-05-22 (×2): 3 mL via RESPIRATORY_TRACT
  Filled 2019-05-22: qty 3

## 2019-05-22 MED ORDER — ALBUTEROL SULFATE (2.5 MG/3ML) 0.083% IN NEBU
5.0000 mg | INHALATION_SOLUTION | Freq: Once | RESPIRATORY_TRACT | Status: AC
  Administered 2019-05-22: 5 mg via RESPIRATORY_TRACT
  Filled 2019-05-22: qty 6

## 2019-05-22 NOTE — ED Notes (Signed)
Pt restful at this time with less resp distress.  Denies any needs at this time.

## 2019-05-22 NOTE — ED Notes (Signed)
Pt ambulatory with no assistance with SpO2 monitor. SpO2 between 96 and 88% on room air. Pt wears no oxygen at home. Pt endorses no shortness of breath. Pt resting in bed with trach collar in place, SpO2 99%.

## 2019-05-22 NOTE — ED Notes (Signed)
Pt alert and oriented with no further resp distress.  Wife called to transport home.  DC ready.

## 2019-05-22 NOTE — ED Notes (Signed)
Family member now here at bedside.  Pt denies any pain at this time, breathing improved and states he has returned to baseline breathing.

## 2019-05-22 NOTE — Progress Notes (Signed)
Pt arrived to ED notably short of breath with #6 cuffless Shiley trach tube in place.  Trach tube was noted to be patient and pt was suctioned for moderate amount of pale yellow sputum.  Pt placed on humidified o2 via 100% trach collar as o2 saturations were 90%.  Within 5 minutes, o2 saturation improved to 100% and Fio2 was weaned to .40. Bilateral breath sounds noted with inspiratory and expiratory wheezes throughout.  Duoneb treatment given via HHN with some improvement in wheezing.  Pt states he is breathing easier and he appears to be doing so at this time. Will continue to follow.

## 2019-05-22 NOTE — Discharge Instructions (Signed)
Please read and follow all provided instructions.  Your diagnoses today include:  1. Shortness of breath   2. COPD exacerbation ([place])     Tests performed today include:  Chest x-ray - did not show pneumonia  Blood counts and electrolytes  EKG  Vital signs. See below for your results today.   Medications prescribed:   Prednisone - steroid medicine   It is best to take this medication in the morning to prevent sleeping problems. If you are diabetic, monitor your blood sugar closely and stop taking Prednisone if blood sugar is over 300. Take with food to prevent stomach upset.   Take any prescribed medications only as directed.  Home care instructions:  Follow any educational materials contained in this packet.  Continue the antibiotics given to you at previous visit.  Continue breathing medications at home.  Follow-up instructions: Please follow-up with your primary care provider in the next 2 days for further evaluation of your symptoms and management of your COPD.   Return instructions:   Please return to the Emergency Department if you experience worsening symptoms.  Please return with worsening wheezing, shortness of breath, or difficulty breathing.  Return with persistent fever above 101F.   Please return if you have any other emergent concerns.  Additional Information:  Your vital signs today were: BP 138/86    Pulse (!) 101    Temp 97.9 F (36.6 C) (Oral)    Resp (!) 24    SpO2 94%  If your blood pressure (BP) was elevated above 135/85 this visit, please have this repeated by your doctor within one month. --------------
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
## 2019-05-22 NOTE — ED Triage Notes (Signed)
Pt has trach and is here POV with respiratory distress sudden onset this morning that woke him out of sleep.

## 2019-05-28 ENCOUNTER — Encounter (HOSPITAL_COMMUNITY): Payer: Self-pay

## 2019-05-28 ENCOUNTER — Inpatient Hospital Stay (HOSPITAL_COMMUNITY)
Admission: EM | Admit: 2019-05-28 | Discharge: 2019-05-30 | DRG: 205 | Disposition: A | Discharge status: Home / Self Care | Payer: No Typology Code available for payment source | Attending: Internal Medicine | Admitting: Internal Medicine

## 2019-05-28 ENCOUNTER — Emergency Department (HOSPITAL_COMMUNITY): Payer: No Typology Code available for payment source

## 2019-05-28 DIAGNOSIS — Z79899 Other long term (current) drug therapy: Secondary | ICD-10-CM

## 2019-05-28 DIAGNOSIS — N400 Enlarged prostate without lower urinary tract symptoms: Secondary | ICD-10-CM | POA: Diagnosis present

## 2019-05-28 DIAGNOSIS — J3802 Paralysis of vocal cords and larynx, bilateral: Secondary | ICD-10-CM | POA: Diagnosis present

## 2019-05-28 DIAGNOSIS — Z20828 Contact with and (suspected) exposure to other viral communicable diseases: Secondary | ICD-10-CM | POA: Diagnosis present

## 2019-05-28 DIAGNOSIS — J440 Chronic obstructive pulmonary disease with acute lower respiratory infection: Secondary | ICD-10-CM | POA: Diagnosis present

## 2019-05-28 DIAGNOSIS — J449 Chronic obstructive pulmonary disease, unspecified: Secondary | ICD-10-CM | POA: Diagnosis not present

## 2019-05-28 DIAGNOSIS — T17990A Other foreign object in respiratory tract, part unspecified in causing asphyxiation, initial encounter: Principal | ICD-10-CM | POA: Diagnosis present

## 2019-05-28 DIAGNOSIS — J383 Other diseases of vocal cords: Secondary | ICD-10-CM | POA: Diagnosis not present

## 2019-05-28 DIAGNOSIS — Z8709 Personal history of other diseases of the respiratory system: Secondary | ICD-10-CM | POA: Diagnosis not present

## 2019-05-28 DIAGNOSIS — J441 Chronic obstructive pulmonary disease with (acute) exacerbation: Secondary | ICD-10-CM | POA: Diagnosis present

## 2019-05-28 DIAGNOSIS — Z930 Tracheostomy status: Secondary | ICD-10-CM | POA: Diagnosis not present

## 2019-05-28 DIAGNOSIS — R0602 Shortness of breath: Secondary | ICD-10-CM | POA: Diagnosis not present

## 2019-05-28 DIAGNOSIS — J181 Lobar pneumonia, unspecified organism: Secondary | ICD-10-CM | POA: Diagnosis not present

## 2019-05-28 DIAGNOSIS — Z8521 Personal history of malignant neoplasm of larynx: Secondary | ICD-10-CM | POA: Diagnosis not present

## 2019-05-28 DIAGNOSIS — Z79891 Long term (current) use of opiate analgesic: Secondary | ICD-10-CM | POA: Diagnosis not present

## 2019-05-28 DIAGNOSIS — Z825 Family history of asthma and other chronic lower respiratory diseases: Secondary | ICD-10-CM

## 2019-05-28 DIAGNOSIS — I2510 Atherosclerotic heart disease of native coronary artery without angina pectoris: Secondary | ICD-10-CM | POA: Diagnosis present

## 2019-05-28 DIAGNOSIS — X58XXXA Exposure to other specified factors, initial encounter: Secondary | ICD-10-CM | POA: Diagnosis present

## 2019-05-28 DIAGNOSIS — I10 Essential (primary) hypertension: Secondary | ICD-10-CM | POA: Diagnosis present

## 2019-05-28 DIAGNOSIS — J9601 Acute respiratory failure with hypoxia: Secondary | ICD-10-CM | POA: Diagnosis not present

## 2019-05-28 DIAGNOSIS — J189 Pneumonia, unspecified organism: Secondary | ICD-10-CM | POA: Diagnosis not present

## 2019-05-28 DIAGNOSIS — Z7951 Long term (current) use of inhaled steroids: Secondary | ICD-10-CM | POA: Diagnosis not present

## 2019-05-28 DIAGNOSIS — R0603 Acute respiratory distress: Secondary | ICD-10-CM | POA: Diagnosis not present

## 2019-05-28 DIAGNOSIS — Z9889 Other specified postprocedural states: Secondary | ICD-10-CM | POA: Diagnosis not present

## 2019-05-28 LAB — CBC WITH DIFFERENTIAL/PLATELET
Abs Immature Granulocytes: 0.01 10*3/uL (ref 0.00–0.07)
Basophils Absolute: 0.1 10*3/uL (ref 0.0–0.1)
Basophils Relative: 1 %
Eosinophils Absolute: 0.9 10*3/uL — ABNORMAL HIGH (ref 0.0–0.5)
Eosinophils Relative: 18 %
HCT: 39.3 % (ref 39.0–52.0)
Hemoglobin: 13.2 g/dL (ref 13.0–17.0)
Immature Granulocytes: 0 %
Lymphocytes Relative: 27 %
Lymphs Abs: 1.4 10*3/uL (ref 0.7–4.0)
MCH: 29.4 pg (ref 26.0–34.0)
MCHC: 33.6 g/dL (ref 30.0–36.0)
MCV: 87.5 fL (ref 80.0–100.0)
Monocytes Absolute: 0.5 10*3/uL (ref 0.1–1.0)
Monocytes Relative: 10 %
Neutro Abs: 2.2 10*3/uL (ref 1.7–7.7)
Neutrophils Relative %: 44 %
Platelets: 312 10*3/uL (ref 150–400)
RBC: 4.49 MIL/uL (ref 4.22–5.81)
RDW: 15 % (ref 11.5–15.5)
WBC: 5.1 10*3/uL (ref 4.0–10.5)
nRBC: 0 % (ref 0.0–0.2)

## 2019-05-28 LAB — SARS CORONAVIRUS 2 BY RT PCR (HOSPITAL ORDER, PERFORMED IN ~~LOC~~ HOSPITAL LAB): SARS Coronavirus 2: NEGATIVE

## 2019-05-28 LAB — POCT I-STAT 7, (LYTES, BLD GAS, ICA,H+H)
Acid-base deficit: 2 mmol/L (ref 0.0–2.0)
Bicarbonate: 24.6 mmol/L (ref 20.0–28.0)
Calcium, Ion: 1.25 mmol/L (ref 1.15–1.40)
HCT: 40 % (ref 39.0–52.0)
Hemoglobin: 13.6 g/dL (ref 13.0–17.0)
O2 Saturation: 96 %
Patient temperature: 98.6
Potassium: 4.3 mmol/L (ref 3.5–5.1)
Sodium: 140 mmol/L (ref 135–145)
TCO2: 26 mmol/L (ref 22–32)
pCO2 arterial: 46.4 mmHg (ref 32.0–48.0)
pH, Arterial: 7.331 — ABNORMAL LOW (ref 7.350–7.450)
pO2, Arterial: 87 mmHg (ref 83.0–108.0)

## 2019-05-28 LAB — BASIC METABOLIC PANEL
Anion gap: 7 (ref 5–15)
BUN: 8 mg/dL (ref 8–23)
CO2: 23 mmol/L (ref 22–32)
Calcium: 8.8 mg/dL — ABNORMAL LOW (ref 8.9–10.3)
Chloride: 107 mmol/L (ref 98–111)
Creatinine, Ser: 0.74 mg/dL (ref 0.61–1.24)
GFR calc Af Amer: >60 mL/min (ref >60)
GFR calc non Af Amer: >60 mL/min (ref >60)
Glucose, Bld: 90 mg/dL (ref 70–99)
Potassium: 3.9 mmol/L (ref 3.5–5.1)
Sodium: 137 mmol/L (ref 135–145)

## 2019-05-28 LAB — LACTIC ACID, PLASMA: Lactic Acid, Venous: 1.5 mmol/L (ref 0.5–1.9)

## 2019-05-28 MED ORDER — SODIUM CHLORIDE 0.9 % IV SOLN
1.0000 g | Freq: Once | INTRAVENOUS | Status: AC
  Administered 2019-05-28: 1 g via INTRAVENOUS
  Filled 2019-05-28: qty 10

## 2019-05-28 MED ORDER — ALBUTEROL SULFATE HFA 108 (90 BASE) MCG/ACT IN AERS: 8.0000 | INHALATION_SPRAY | Freq: Once | RESPIRATORY_TRACT | Status: DC

## 2019-05-28 MED ORDER — MAGNESIUM SULFATE 2 GM/50ML IV SOLN
2.0000 g | Freq: Once | INTRAVENOUS | Status: AC
  Administered 2019-05-28: 17:00:00 2 g via INTRAVENOUS
  Filled 2019-05-28: qty 50

## 2019-05-28 MED ORDER — ENOXAPARIN SODIUM 40 MG/0.4ML ~~LOC~~ SOLN
40.0000 mg | SUBCUTANEOUS | Status: DC
  Administered 2019-05-29 (×2): 40 mg via SUBCUTANEOUS
  Filled 2019-05-28 (×3): qty 0.4

## 2019-05-28 MED ORDER — SODIUM CHLORIDE 0.9 % IV SOLN
500.0000 mg | Freq: Once | INTRAVENOUS | Status: AC
  Administered 2019-05-28: 20:00:00 500 mg via INTRAVENOUS
  Filled 2019-05-28: qty 500

## 2019-05-28 MED ORDER — UMECLIDINIUM-VILANTEROL 62.5-25 MCG/INH IN AEPB
1.0000 | INHALATION_SPRAY | Freq: Every day | RESPIRATORY_TRACT | Status: DC
  Filled 2019-05-28: qty 14

## 2019-05-28 MED ORDER — ACETAMINOPHEN 325 MG PO TABS
650.0000 mg | ORAL_TABLET | Freq: Four times a day (QID) | ORAL | Status: DC | PRN
  Administered 2019-05-28 – 2019-05-29 (×2): 650 mg via ORAL
  Filled 2019-05-28 (×2): qty 2

## 2019-05-28 MED ORDER — BUDESONIDE 0.5 MG/2ML IN SUSP
0.5000 mg | Freq: Two times a day (BID) | RESPIRATORY_TRACT | Status: DC
  Administered 2019-05-28 – 2019-05-30 (×4): 0.5 mg via RESPIRATORY_TRACT
  Filled 2019-05-28 (×5): qty 2

## 2019-05-28 MED ORDER — IPRATROPIUM-ALBUTEROL 0.5-2.5 (3) MG/3ML IN SOLN
3.0000 mL | Freq: Four times a day (QID) | RESPIRATORY_TRACT | Status: DC
  Filled 2019-05-28: qty 3

## 2019-05-28 MED ORDER — ALBUTEROL SULFATE (2.5 MG/3ML) 0.083% IN NEBU: 2.5000 mg | INHALATION_SOLUTION | RESPIRATORY_TRACT | Status: DC | PRN

## 2019-05-28 MED ORDER — DM-GUAIFENESIN ER 30-600 MG PO TB12
1.0000 | ORAL_TABLET | Freq: Two times a day (BID) | ORAL | Status: DC
  Administered 2019-05-28 – 2019-05-30 (×4): 1 via ORAL
  Filled 2019-05-28 (×6): qty 1

## 2019-05-28 MED ORDER — SODIUM CHLORIDE 0.9 % IV SOLN
1.0000 g | INTRAVENOUS | Status: DC
  Administered 2019-05-29: 19:00:00 1 g via INTRAVENOUS
  Filled 2019-05-28: qty 1
  Filled 2019-05-28 (×2): qty 10

## 2019-05-28 MED ORDER — METHYLPREDNISOLONE SODIUM SUCC 125 MG IJ SOLR
60.0000 mg | Freq: Four times a day (QID) | INTRAMUSCULAR | Status: DC
  Administered 2019-05-29: 60 mg via INTRAVENOUS
  Filled 2019-05-28: qty 2

## 2019-05-28 MED ORDER — IPRATROPIUM-ALBUTEROL 0.5-2.5 (3) MG/3ML IN SOLN
3.0000 mL | Freq: Once | RESPIRATORY_TRACT | Status: AC
  Administered 2019-05-28: 17:00:00 3 mL via RESPIRATORY_TRACT
  Filled 2019-05-28: qty 3

## 2019-05-28 MED ORDER — SODIUM CHLORIDE 3 % IN NEBU
4.0000 mL | INHALATION_SOLUTION | Freq: Every day | RESPIRATORY_TRACT | Status: DC
  Administered 2019-05-28: 21:00:00 4 mL via RESPIRATORY_TRACT
  Filled 2019-05-28 (×2): qty 4

## 2019-05-28 MED ORDER — IPRATROPIUM BROMIDE 0.02 % IN SOLN
0.5000 mg | Freq: Once | RESPIRATORY_TRACT | Status: AC
  Administered 2019-05-28: 20:00:00 0.5 mg via RESPIRATORY_TRACT
  Filled 2019-05-28: qty 2.5

## 2019-05-28 MED ORDER — METHYLPREDNISOLONE SODIUM SUCC 125 MG IJ SOLR
125.0000 mg | Freq: Once | INTRAMUSCULAR | Status: AC
  Administered 2019-05-28: 17:00:00 125 mg via INTRAVENOUS
  Filled 2019-05-28: qty 2

## 2019-05-28 MED ORDER — ACETAMINOPHEN 650 MG RE SUPP: 650.0000 mg | Freq: Four times a day (QID) | RECTAL | Status: DC | PRN

## 2019-05-28 MED ORDER — ALBUTEROL SULFATE (2.5 MG/3ML) 0.083% IN NEBU
2.5000 mg | INHALATION_SOLUTION | RESPIRATORY_TRACT | Status: DC | PRN
  Administered 2019-05-28 – 2019-05-29 (×3): 2.5 mg via RESPIRATORY_TRACT
  Filled 2019-05-28 (×2): qty 3

## 2019-05-28 MED ORDER — ALBUTEROL (5 MG/ML) CONTINUOUS INHALATION SOLN
10.0000 mg/h | INHALATION_SOLUTION | RESPIRATORY_TRACT | Status: DC
  Administered 2019-05-28: 10 mg/h via RESPIRATORY_TRACT
  Filled 2019-05-28: qty 20

## 2019-05-28 MED ORDER — IPRATROPIUM BROMIDE HFA 17 MCG/ACT IN AERS
4.0000 | INHALATION_SPRAY | Freq: Once | RESPIRATORY_TRACT | Status: DC
  Filled 2019-05-28: qty 12.9

## 2019-05-28 MED ORDER — SODIUM CHLORIDE 0.9 % IV SOLN
500.0000 mg | INTRAVENOUS | Status: DC
  Administered 2019-05-29: 500 mg via INTRAVENOUS
  Filled 2019-05-28 (×2): qty 500

## 2019-05-28 MED ORDER — ALBUTEROL SULFATE (2.5 MG/3ML) 0.083% IN NEBU
2.5000 mg | INHALATION_SOLUTION | Freq: Four times a day (QID) | RESPIRATORY_TRACT | Status: DC
  Administered 2019-05-28 – 2019-05-29 (×4): 2.5 mg via RESPIRATORY_TRACT
  Filled 2019-05-28 (×5): qty 3

## 2019-05-28 MED ORDER — ACETYLCYSTEINE 20 % IN SOLN
600.0000 mg | Freq: Four times a day (QID) | RESPIRATORY_TRACT | Status: DC
  Administered 2019-05-28: 600 mg via RESPIRATORY_TRACT
  Filled 2019-05-28 (×3): qty 4

## 2019-05-28 NOTE — ED Notes (Signed)
Care handoff to Elizabeth RN  
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
## 2019-05-28 NOTE — ED Notes (Signed)
Called [facility] to make sure they had pt on their list for treatments ordered.

## 2019-05-28 NOTE — ED Notes (Signed)
Per Dr Marlowe Sax, pt needs suctioning and mucomist tx immediately. Called [facility] [place].

## 2019-05-28 NOTE — H&P (Signed)
History and Physical    [name] [name] [id number] DOB: Jan 26, 1951 DOA: 05/28/2019  PCP: Caren Macadam, MD Patient coming from: Home  Chief Complaint: Shortness of breath  HPI: [name] [name] is a 68 y.o. 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 History limited due to patient's significant respiratory distress.  Reports 3-day history of shortness of breath and wheezing.  He has been noticing a lot of thick mucus from his tracheostomy.  Denies chest pain.  ED Course: On arrival to the ED, noted to be tachypneic with retractions.  Coughing up thick mucus.  Not able to speak in complete sentences.  SPO2 86% on room air.  SARS-CoV-2 test negative.  No temperature recorded.  No leukocytosis.  Lactic acid normal.  Chest x-ray showing left lower lobe consolidation. Patient received DuoNeb nebulizer treatment, Solu-Medrol 125 mg, ceftriaxone, azithromycin, and magnesium 2 g in the ED.  Review of Systems:  All systems reviewed and apart from history of presenting illness, are negative.  Past Medical History:  Diagnosis Date  . Abnormal EKG 12/24/11   anteroseptal and lateral ST elevation, felt r/t early repolarization;  Cardiac cath 12/24/11 - normal coronary anatomy, EF 55-65%  . Anxiety   . Arthritis   . Asthma   . Bradycardia, sinus 12/24/11  . COPD (chronic obstructive pulmonary disease) ([place])   . Dyspnea   . H/O tracheostomy   . History of alcohol abuse    hospitalized for detox 2002  . HTN (hypertension)   . Hypotension 12/24/11   in the setting of dehydration   . Marijuana use   . Pneumonia   . Syncope and collapse 12/24/11   2/2 hypotension in the setting of dehydration  . Upper airway cough syndrome     Past Surgical History:  Procedure Laterality Date  . CARDIAC CATHETERIZATION  12/24/11    normal coronary anatomy, EF 55-65%  . CIRCUMCISION  1972  . COLONOSCOPY WITH PROPOFOL N/A 02/24/2018   Procedure: COLONOSCOPY WITH PROPOFOL;  Surgeon: Otis Brace, MD;  Location: [facility];  Service: Gastroenterology;  Laterality: N/A;  . CYSTOSCOPY W/ RETROGRADES Bilateral 03/08/2019   Procedure: CYSTOSCOPY WITH RETROGRADE PYELOGRAM;  Surgeon: Alexis Frock, MD;  Location: [facility];  Service: Urology;  Laterality: Bilateral;  . CYSTOSCOPY WITH BIOPSY N/A 03/08/2019   Procedure: CYSTOSCOPY WITH BIOPSY WITH FUGERATION;  Surgeon: Alexis Frock, MD;  Location: [facility];  Service: Urology;  Laterality: N/A;  . ESOPHAGOGASTRODUODENOSCOPY N/A 10/09/2015   Procedure: ESOPHAGOGASTRODUODENOSCOPY (EGD);  Surgeon: Judeth Horn, MD;  Location: [facility] ENDOSCOPY;  Service: General;  Laterality: N/A;  . IRRIGATION AND DEBRIDEMENT KNEE Right 05/23/2017   Procedure: IRRIGATION AND DEBRIDEMENT KNEE;  Surgeon: Wylene Simmer, MD;  Location: [facility];  Service: Orthopedics;  Laterality: Right;  . LACERATION REPAIR  02/2004   arthroscopic debridement of triagular fibrocartilage tear/E-chart; right wrist  . LEFT HEART CATHETERIZATION WITH CORONARY ANGIOGRAM N/A 12/24/2011   Procedure: LEFT HEART CATHETERIZATION WITH CORONARY ANGIOGRAM;  Surgeon: Peter M Martinique, MD;  Location[facility] C[facility] LAB;  Service: Cardiovascular;  Laterality: N/A;  . PEG PLACEMENT N/A 10/09/2015   Procedure: PERCUTANEOUS ENDOSCOPIC GASTROSTOMY (PEG) PLACEMENT;  Surgeon: Judeth Horn, MD;  Location: [place];  Service: General;  Laterality: N/A;  . PEG TUBE REMOVAL    . PERCUTANEOUS TRACHEOSTOMY N/A 10/09/2015   Procedure: PERCUTANEOUS TRACHEOSTOMY;  Surgeon: Judeth Horn, MD;  Location: [place];  Service: General;  Laterality: N/A;  . POLYPECTOMY  02/24/2018   Procedure: POLYPECTOMY;  Surgeon: Otis Brace, MD;  Location: [facility];  Service: Gastroenterology;;  . SINUS ENDO WITH FUSION  06/07/2018  . SINUS ENDO WITH FUSION Bilateral 06/07/2018    Procedure: SINUS ENDO WITH FUSION;  Surgeon: Melissa Montane, MD;  Location: [place];  Service: ENT;  Laterality: Bilateral;  with fuision     reports that he has never smoked. He has never used smokeless tobacco. He reports current alcohol use of about 1.0 standard drinks of alcohol per week. He reports previous drug use. Drug: Marijuana.  No Known Allergies  Family History  Problem Relation Age of Onset  . COPD Sister   . Cancer Sister   . Asthma Other     Prior to Admission medications   Medication Sig Start Date End Date Taking? Authorizing Provider  albuterol (PROVENTIL HFA;VENTOLIN HFA) 108 (90 Base) MCG/ACT inhaler Inhale 2 puffs into the lungs every 6 (six) hours as needed for wheezing or shortness of breath. 10/02/18   Volanda Napoleon, PA-C  albuterol (PROVENTIL) (2.5 MG/3ML) 0.083% nebulizer solution Take 3-6 mLs (2.5-5 mg total) by nebulization every 4 (four) hours as needed for wheezing or shortness of breath. 10/02/18   Volanda Napoleon, PA-C  amoxicillin (AMOXIL) 500 MG capsule Take 1 capsule (500 mg total) by mouth 3 (three) times daily. 05/20/19   Domenic Moras, PA-C  azithromycin (ZITHROMAX Z-PAK) 250 MG tablet 2 po day one, then 1 daily x 4 days 05/20/19   Domenic Moras, PA-C  budesonide-formoterol [facility]) 160-4.5 MCG/ACT inhaler Take 2 puffs first thing in am and then another 2 puffs about 12 hours later. Patient taking differently: Inhale 2 puffs into the lungs 2 (two) times daily.  10/02/18   Volanda Napoleon, PA-C  finasteride (PROSCAR) 5 MG tablet Take 5 mg by mouth daily.    [provider]  fluticasone (FLONASE) 50 MCG/ACT nasal spray Place 2 sprays into both nostrils daily.    [provider]  guaiFENesin (MUCINEX) 600 MG 12 hr tablet Take 600 mg by mouth 2 (two) times daily.    [provider]  hydrochlorothiazide (HYDRODIURIL) 25 MG tablet Take 1 tablet (25 mg total) by mouth daily. Patient not taking: Reported on 05/20/2019 05/30/18   Lavina Hamman, MD  Multiple Vitamin (MULTIVITAMIN) tablet Take 1 tablet by mouth daily.    [provider]  pantoprazole (PROTONIX) 40 MG tablet TAKE 1 TABLET(40 MG) BY MOUTH DAILY 30 TO 60 MINUTES BEFORE FIRST MEAL OF THE DAY Patient taking differently: Take 40 mg by mouth daily before breakfast.  05/09/19   Tanda Rockers, MD  terazosin (HYTRIN) 5 MG capsule Take 5 mg by mouth at bedtime.  02/08/19   [provider]  traMADol (ULTRAM) 50 MG tablet Take 1-2 tablets (50-100 mg total) by mouth every 6 (six) hours as needed for moderate pain or severe pain. Post-operatively 03/08/19   Alexis Frock, MD    Physical Exam: Vitals:   05/28/19 2240 05/28/19 2246 05/28/19 2308 05/28/19 2323  BP:    (!) 154/90  Pulse:   88 (!) 101  Resp:   (!) 25 (!) 21  Temp:  (!) 97.4 F (36.3 C)    TempSrc:  Axillary    SpO2: 100%  100% 100%  Weight:      Height:        Physical Exam  Constitutional: He is oriented to person, place, and time.  He appears well-developed and well-nourished. He appears distressed.  HENT:  Head: Normocephalic.  Eyes: Right eye exhibits no discharge. Left eye exhibits no discharge.  Neck: Neck supple.  Cardiovascular: Regular rhythm and intact distal pulses.  Tachycardic  Pulmonary/Chest:  Tachypneic with respiratory rate in the 30s Significantly increased work of breathing, accessory muscle use Diffuse end expiratory wheezing Very thick mucus secretions noted from tracheostomy  Abdominal: Soft. Bowel sounds are normal. He exhibits no distension. There is no abdominal tenderness. There is no guarding.  Musculoskeletal:        General: No edema.  Neurological: He is alert and oriented to person, place, and time.  Skin: Skin is warm and dry. He is not diaphoretic.     Labs on Admission: I have personally reviewed following labs and imaging studies  CBC: Recent Labs  Lab 05/22/19 0903 05/28/19 1700 05/28/19 1939  WBC 5.7 5.1  --   NEUTROABS 2.2 2.2   --   HGB 13.2 13.2 13.6  HCT 40.5 39.3 40.0  MCV 90.0 87.5  --   PLT 311 312  --    Basic Metabolic Panel: Recent Labs  Lab 05/22/19 0903 05/28/19 1700 05/28/19 1939  NA 137 137 140  K 3.7 3.9 4.3  CL 104 107  --   CO2 23 23  --   GLUCOSE 96 90  --   BUN 10 8  --   CREATININE 0.98 0.74  --   CALCIUM 9.1 8.8*  --    GFR: Estimated Creatinine Clearance: 77.9 mL/min (by C-G formula based on SCr of 0.74 mg/dL). Liver Function Tests: No results for input(s): AST, ALT, ALKPHOS, BILITOT, PROT, ALBUMIN in the last 168 hours. No results for input(s): LIPASE, AMYLASE in the last 168 hours. No results for input(s): AMMONIA in the last 168 hours. Coagulation Profile: No results for input(s): INR, PROTIME in the last 168 hours. Cardiac Enzymes: No results for input(s): CKTOTAL, CKMB, CKMBINDEX, TROPONINI in the last 168 hours. BNP (last 3 results) No results for input(s): PROBNP in the last 8760 hours. HbA1C: No results for input(s): HGBA1C in the last 72 hours. CBG: No results for input(s): GLUCAP in the last 168 hours. Lipid Profile: No results for input(s): CHOL, HDL, LDLCALC, TRIG, CHOLHDL, LDLDIRECT in the last 72 hours. Thyroid Function Tests: No results for input(s): TSH, T4TOTAL, FREET4, T3FREE, THYROIDAB in the last 72 hours. Anemia Panel: No results for input(s): VITAMINB12, FOLATE, FERRITIN, TIBC, IRON, RETICCTPCT in the last 72 hours. Urine analysis:    Component Value Date/Time   COLORURINE STRAW (A) 09/26/2016 1620   APPEARANCEUR CLEAR 09/26/2016 1620   LABSPEC 1.006 09/26/2016 1620   PHURINE 6.0 09/26/2016 1620   GLUCOSEU NEGATIVE 09/26/2016 1620   HGBUR SMALL (A) 09/26/2016 1620   BILIRUBINUR NEGATIVE 09/26/2016 1620   KETONESUR NEGATIVE 09/26/2016 1620   PROTEINUR NEGATIVE 09/26/2016 1620   UROBILINOGEN 0.2 09/30/2013 0224   NITRITE NEGATIVE 09/26/2016 1620   LEUKOCYTESUR NEGATIVE 09/26/2016 1620    Radiological Exams on Admission: Dg Chest Portable 1  View  Result Date: 05/28/2019 CLINICAL DATA:  Patient with cough. EXAM: PORTABLE CHEST 1 VIEW COMPARISON:  Chest radiograph 05/22/2019 FINDINGS: Monitoring leads overlie the patient. Tracheostomy tube terminates in the mid trachea. Stable cardiomegaly. Interval development of patchy consolidation within the left lower lung. No pleural effusion or pneumothorax. Thoracic spine degenerative changes. IMPRESSION: Left lower lung consolidation may represent pneumonia in the appropriate clinical setting. Followup PA and lateral chest X-ray is recommended in 3-4  weeks following trial of antibiotic therapy to ensure resolution and exclude underlying malignancy. Electronically Signed   By: Lovey Newcomer M.D.   On: 05/28/2019 18:09    EKG: Independently reviewed. Sinus rhythm, heart rate 88.  Artifact.  Assessment/Plan Principal Problem:   Acute respiratory failure with hypoxia (HCC) Active Problems:   HTN (hypertension)   CAP (community acquired pneumonia)   Acute respiratory distress   Vocal cord dysfunction   Acute significant respiratory distress Acute hypoxic respiratory failure secondary to mucous plugging, acute bronchospasm in the setting of vocal cord dysfunction status post tracheostomy, CAP -Patient has a history of bilateral vocal cord paralysis and has a chronic tracheostomy. -SPO2 86% on room air on arrival to the ED.  At the time of my evaluation, noted to be in significant respiratory distress.  Tachypneic with respiratory rate in the 30s.  Tachycardic.  Significantly increased work of breathing with accessory muscle use.  Thick mucus noted from tracheostomy.  Significant wheezing on exam.  PCCM was consulted immediately.  Underwent bronchoscopy but it was noted his airways were edematous and erythematous.  Almost complete collapse of upper airway on expiration. -SARS-CoV-2 test negative.  Afebrile and no leukocytosis.  Lactic acid normal.  Chest x-ray showing left lower lobe consolidation.  Patient received DuoNeb nebulizer treatment, Solu-Medrol 125 mg, ceftriaxone, azithromycin, and magnesium 2 g in the ED. -Admit to progressive care unit -Pulmicort nebulizer twice daily -Anoro Ellipta inhalation daily -Albuterol nebulizer scheduled every 6 hours -Albuterol nebulizer every 2 hours PRN wheezing/ shortness of breath -Solu-Medrol 60 mg every 6 hours, taper when wheezing improves -Hypertonic nebs 3% x 1 -Inhaled Mucomyst -Mucinex -Pulmonary toilet, respiratory therapist consulted -Continue ceftriaxone and azithromycin for CAP -RVP, droplet precautions -Respiratory culture -Legionella urinary antigen, strep urinary antigen -Continuous pulse ox, supplemental oxygen via trach  Essential hypertension -Systolic in the [id number] to [id number].  Hydralazine PRN.  DVT prophylaxis: Lovenox Code Status: Full code.  Discussed with patient and daughter. Family Communication: Daughter at bedside. Disposition Plan: None Consults called: PCCM Admission status: It is my clinical opinion that admission to [place] is reasonable and necessary in this 68 y.o. male presenting with significant respiratory distress.  Found to have acute hypoxic respiratory failure secondary to mucous plugging, acute bronchospasm in the setting of vocal cord dysfunction status post tracheostomy, and CAP.  PCCM following.  High risk of decompensation.  Given the aforementioned, the predictability of an adverse outcome is felt to be significant. I expect that the patient will require at least 2 midnights in the hospital to treat this condition.  The medical decision making on this patient was of high complexity and the patient is at high risk for clinical deterioration, therefore this is a level 3 visit.  Shela Leff MD [facility] Pager [phone]  If 7PM-7AM, please contact night-coverage [email or web address] Password [email or web address]  05/29/2019, 1:07 AM

## 2019-05-28 NOTE — ED Notes (Signed)
If pt is discharged call daughter Kipp Brood he has her number in his cell phone

## 2019-05-28 NOTE — ED Notes (Signed)
NRBar 15 l held to trach sats improving

## 2019-05-28 NOTE — ED Triage Notes (Addendum)
Pt arrives from home on his motorcycle. Pty stats he woke with [facility] and worsened over day. Pt has trach in place. Pt couging thick mucous and tachypnic with retractions. Not speaking complete sentences.
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
## 2019-05-28 NOTE — ED Notes (Signed)
Shamire asking for a call back would like to know if patient is going to be discharged tonight  Please call   [phone]

## 2019-05-28 NOTE — Procedures (Signed)
Bronchoscopy Procedure Note Jerami Brodrick [id number] [birth year]  Procedure: Bronchoscopy Indications: Diagnostic evaluation of the airways and Remove secretions  Procedure Details Consent: Risks of procedure as well as the alternatives and risks of each were explained to the (patient/caregiver).  Consent for procedure obtained. Time Out: Verified patient identification, verified procedure, site/side was marked, verified correct patient position, special equipment/implants available, medications/allergies/relevent history reviewed, required imaging and test results available.  Performed  In preparation for procedure, patient was given 100% FiO2, bronchoscope lubricated, inhaled beta agonist administered and lidocaine 1% ( 5 cc) given prior to introduction of scope. Sedation: no sedation required  Airway entered and the following bronchi were examined: Bronchi.   Procedures performed: diagnostic eval only Vocal cord dysfunction noted  Tracheostomy tube visualized in position  [place]ina visulized Airways edematous and erythema Almost complete collapse of upper airway on expiration No repooling appreciated examined LLL ( area of consolidation on CXR) no repooling noted Bronchoscope removed.    Evaluation Hemodynamic Status: BP stable throughout; O2 sats: stable throughout Patient's Current Condition: stable Specimens:  None Complications: No apparent complications Patient did tolerate procedure well.   Rise Paganini [place]fe 05/28/2019

## 2019-05-29 DIAGNOSIS — J181 Lobar pneumonia, unspecified organism: Secondary | ICD-10-CM

## 2019-05-29 LAB — RESPIRATORY PANEL BY PCR

## 2019-05-29 LAB — STREP PNEUMONIAE URINARY ANTIGEN: Strep Pneumo Urinary Antigen: NEGATIVE

## 2019-05-29 MED ORDER — FAMOTIDINE IN NACL 20-0.9 MG/50ML-% IV SOLN
20.0000 mg | Freq: Two times a day (BID) | INTRAVENOUS | Status: DC
  Administered 2019-05-29 – 2019-05-30 (×3): 20 mg via INTRAVENOUS
  Filled 2019-05-29 (×4): qty 50

## 2019-05-29 MED ORDER — METHYLPREDNISOLONE SODIUM SUCC 40 MG IJ SOLR
40.0000 mg | Freq: Two times a day (BID) | INTRAMUSCULAR | Status: DC
  Administered 2019-05-30 (×2): 40 mg via INTRAVENOUS
  Filled 2019-05-29 (×2): qty 1

## 2019-05-29 MED ORDER — HYDRALAZINE HCL 20 MG/ML IJ SOLN: 5.0000 mg | INTRAMUSCULAR | Status: DC | PRN

## 2019-05-29 MED ORDER — GUAIFENESIN-DM 100-10 MG/5ML PO SYRP: 5.0000 mL | ORAL_SOLUTION | ORAL | Status: DC | PRN

## 2019-05-29 MED ORDER — KETOROLAC TROMETHAMINE 30 MG/ML IJ SOLN
15.0000 mg | Freq: Once | INTRAMUSCULAR | Status: AC
  Administered 2019-05-29: 15 mg via INTRAVENOUS
  Filled 2019-05-29: qty 1

## 2019-05-29 MED ORDER — IPRATROPIUM-ALBUTEROL 0.5-2.5 (3) MG/3ML IN SOLN
3.0000 mL | Freq: Four times a day (QID) | RESPIRATORY_TRACT | Status: DC
  Administered 2019-05-29 – 2019-05-30 (×4): 3 mL via RESPIRATORY_TRACT
  Filled 2019-05-29 (×4): qty 3

## 2019-05-29 NOTE — Progress Notes (Signed)
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  He reported 3-day history of shortness of breath and wheezing.  He had been noticing thick mucus from tracheostomy.  On arrival he was found to be tachypneic.  He was saturating 86% on room air.  Chest x-ray showed left lower lobe consolidation.  Patient started on bronchodilators, steroids.  Suspected to have mucous plugging.  Underwent bronchoscopy.  Currently being managed for community acquired pneumonia/COPD exacerbation.   Assessment & Plan:   Principal Problem:   Acute respiratory failure with hypoxia (HCC) Active Problems:   HTN (hypertension)   CAP (community acquired pneumonia)   Acute respiratory distress   Vocal cord dysfunction   Acute respiratory distress: Suspected to be from mucous plug, CAP, COPD exacerbation.  Respiratory status has significantly improved today and is currently near his baseline.  Continue bronchodilators, steroids.  Mucous plugging: Presented with thick secretions, difficulty with clearance.  Given hypertonic nebulization 3%.  Underwent bronchoscopy.  Continue bronchodilators  Vocal cord cord paralysis: Chronic.  He has chronic tracheostomy size 6 nonfenestrated uncuffed.  Community-acquired pneumonia: Chest x-ray showed left lower lobe consolidation.  Continue current antibiotics.  He is afebrile, no leukocytosis.  Will follow cultures.  COPD exacerbation: Continue current steroid therapy, bronchodilators.  He has inhalers at home.  Legionella, Streptococcus antigen pending.  Cultures pending. [facility] we will check if he qualifies for home  oxygen.  Hypertension: Currently blood pressure stable.  Continue current regimen.  I will request for physical therapy assessment.          DVT prophylaxis: Lovenox Code Status: Full Family Communication: Called daughter on phone Disposition Plan: Senior living facilty quarter in in 1 to 2 days   Consultants: PCCM  Procedures: Bronchoscopy  Antimicrobials:  Anti-infectives (From admission, onward)   Start     Dose/Rate Route Frequency Ordered Stop   05/29/19 2000  azithromycin (ZITHROMAX) 500 mg in sodium chloride 0.9 % 250 mL IVPB     500 mg 250 mL/hr over 60 Minutes Intravenous Every 24 hours 05/28/19 2028     05/29/19 1900  cefTRIAXone (ROCEPHIN) 1 g in sodium chloride 0.9 % 100 mL IVPB     1 g 200 mL/hr over 30 Minutes Intravenous Every 24 hours 05/28/19 2028     05/28/19 1830  cefTRIAXone (ROCEPHIN) 1 g in sodium chloride 0.9 % 100 mL IVPB     1 g 200 mL/hr over 30 Minutes Intravenous  Once 05/28/19 1820 05/28/19 2000   05/28/19 1830  azithromycin (ZITHROMAX) 500 mg in sodium chloride 0.9 % 250 mL IVPB     500 mg 250 mL/hr over 60 Minutes Intravenous  Once 05/28/19 1820 05/28/19 2335      Subjective:  Patient seen and examined the bedside this morning.  Hemodynamically stable.  Comfortable.  Respiratory status has significantly improved.  Denies any cough or chest pain or shortness of breath.  Objective: Vitals:   05/29/19 0900 05/29/19 1000 05/29/19 1100 05/29/19 1200  BP: 140/79 (!) 148/85 (!) 146/87 (!) 149/94  Pulse: 69 76 71 99  Resp: 20 (!) 22 (!) 23 (!) 29  Temp:      TempSrc:      SpO2:  98% 98% 99% 95%  Weight:      Height:        Intake/Output Summary (Last 24 hours) at 05/29/2019 1258 Last data filed at 05/28/2019 2000 Gross per 24 hour  Intake 103.86 ml  Output -  Net 103.86 ml   Filed Weights   05/28/19 1816  Weight: 64 kg    Examination:  General exam:Not in distress,average built HEENT:PERRL,Oral mucosa moist, Ear/Nose normal  on gross exam,tracheostomy Respiratory system: Bilateral expiratory wheezes, decreased air entry Cardiovascular system: S1 & S2 heard, RRR. No JVD, murmurs, rubs, gallops or clicks. No pedal edema. Gastrointestinal system: Abdomen is nondistended, soft and nontender. No organomegaly or masses felt. Normal bowel sounds heard. Central nervous system: Alert and oriented. No focal neurological deficits. Extremities: No edema, no clubbing ,no cyanosis, distal peripheral pulses palpable. Skin: No rashes, lesions or ulcers,no icterus ,no pallor   Data Reviewed: I have personally reviewed following labs and imaging studies  CBC: Recent Labs  Lab 05/28/19 1700 05/28/19 1939  WBC 5.1  --   NEUTROABS 2.2  --   HGB 13.2 13.6  HCT 39.3 40.0  MCV 87.5  --   PLT 312  --    Basic Metabolic Panel: Recent Labs  Lab 05/28/19 1700 05/28/19 1939  NA 137 140  K 3.9 4.3  CL 107  --   CO2 23  --   GLUCOSE 90  --   BUN 8  --   CREATININE 0.74  --   CALCIUM 8.8*  --    GFR: Estimated Creatinine Clearance: 77.9 mL/min (by C-G formula based on SCr of 0.74 mg/dL). Liver Function Tests: No results for input(s): AST, ALT, ALKPHOS, BILITOT, PROT, ALBUMIN in the last 168 hours. No results for input(s): LIPASE, AMYLASE in the last 168 hours. No results for input(s): AMMONIA in the last 168 hours. Coagulation Profile: No results for input(s): INR, PROTIME in the last 168 hours. Cardiac Enzymes: No results for input(s): CKTOTAL, CKMB, CKMBINDEX, TROPONINI in the last 168 hours. BNP (last 3 results) No results for input(s): PROBNP in the last 8760 hours. HbA1C: No results for input(s): HGBA1C in the last 72 hours. CBG: No results for input(s): GLUCAP in the last 168 hours. Lipid Profile: No results for input(s): CHOL, HDL, LDLCALC, TRIG, CHOLHDL, LDLDIRECT in the last 72 hours. Thyroid Function Tests: No results for input(s): TSH, T4TOTAL, FREET4, T3FREE, THYROIDAB in the last 72 hours. Anemia  Panel: No results for input(s): VITAMINB12, FOLATE, FERRITIN, TIBC, IRON, RETICCTPCT in the last 72 hours. Sepsis Labs: Recent Labs  Lab 05/28/19 1700  LATICACIDVEN 1.5    Recent Results (from the past 240 hour(s))  SARS Coronavirus 2 [facility] order, Performed in [facility] hospital lab) Nasopharyngeal Nasopharyngeal Swab     Status: None   Collection Time: 05/20/19 10:42 AM   Specimen: Nasopharyngeal Swab  Result Value Ref Range Status   SARS Coronavirus 2 NEGATIVE NEGATIVE Final    Comment: (NOTE) If result is NEGATIVE SARS-CoV-2 target nucleic acids are NOT DETECTED. The SARS-CoV-2 RNA is generally detectable in upper and lower  respiratory specimens during the acute phase of infection. The lowest  concentration of SARS-CoV-2 viral copies this assay can detect is 250  copies / mL. A negative result does not preclude SARS-CoV-2 infection  and should not be used as the sole basis for treatment or other  patient management decisions.  A negative result may occur with  improper specimen collection / handling, submission of specimen other  than nasopharyngeal swab, presence of viral mutation(s) within the  areas targeted by this assay, and inadequate number of viral copies  (<250 copies / mL). A negative result must be combined with clinical  observations, patient history, and epidemiological information. If result is POSITIVE SARS-CoV-2 target nucleic acids are DETECTED. The SARS-CoV-2 RNA is generally detectable in upper and lower  respiratory specimens dur ing the acute phase of infection.  Positive  results are indicative of active infection with SARS-CoV-2.  Clinical  correlation with patient history and other diagnostic information is  necessary to determine patient infection status.  Positive results do  not rule out bacterial infection or co-infection with other viruses. If result is PRESUMPTIVE POSTIVE SARS-CoV-2 nucleic acids MAY BE PRESENT.   A presumptive positive  result was obtained on the submitted specimen  and confirmed on repeat testing.  While 2019 novel coronavirus  (SARS-CoV-2) nucleic acids may be present in the submitted sample  additional confirmatory testing may be necessary for epidemiological  and / or clinical management purposes  to differentiate between  SARS-CoV-2 and other Sarbecovirus currently known to infect humans.  If clinically indicated additional testing with an alternate test  methodology [phone]) is advised. The SARS-CoV-2 RNA is generally  detectable in upper and lower respiratory sp ecimens during the acute  phase of infection. The expected result is Negative. Fact Sheet for Patients:  StrictlyIdeas.no Fact Sheet for Healthcare Providers: BankingDealers.co.za This test is not yet approved or cleared by the [place] FDA and has been authorized for detection and/or diagnosis of SARS-CoV-2 by FDA under an Emergency Use Authorization (EUA).  This EUA will remain in effect (meaning this test can be used) for the duration of the COVID-19 declaration under Section 564(b)(1) of the Act, 21 U.S.C. section 360bbb-3(b)(1), unless the authorization is terminated or revoked sooner. Performed at [facility], [place] [address], [place], [place] [address]   SARS Coronavirus 2 [facility] order, Performed in [facility] hospital lab) Nasopharyngeal Nasopharyngeal Swab     Status: None   Collection Time: 05/28/19  5:25 PM   Specimen: Nasopharyngeal Swab  Result Value Ref Range Status   SARS Coronavirus 2 NEGATIVE NEGATIVE Final    Comment: (NOTE) If result is NEGATIVE SARS-CoV-2 target nucleic acids are NOT DETECTED. The SARS-CoV-2 RNA is generally detectable in upper and lower  respiratory specimens during the acute phase of infection. The lowest  concentration of SARS-CoV-2 viral copies this assay can detect is 250  copies / mL. A negative result does not preclude  SARS-CoV-2 infection  and should not be used as the sole basis for treatment or other  patient management decisions.  A negative result may occur with  improper specimen collection / handling, submission of specimen other  than nasopharyngeal swab, presence of viral mutation(s) within the  areas targeted by this assay, and inadequate number of viral copies  (<250 copies / mL). A negative result must be combined with clinical  observations, patient history, and epidemiological information. If result is POSITIVE SARS-CoV-2 target nucleic acids are DETECTED. The SARS-CoV-2 RNA is generally detectable in upper and lower  respiratory specimens dur ing the acute phase of infection.  Positive  results are indicative of active infection with SARS-CoV-2.  Clinical  correlation with patient history and other diagnostic information is  necessary to determine patient infection status.  Positive results do  not rule out bacterial infection or co-infection with other viruses. If result is PRESUMPTIVE POSTIVE SARS-CoV-2 nucleic acids MAY BE  PRESENT.   A presumptive positive result was obtained on the submitted specimen  and confirmed on repeat testing.  While 2019 novel coronavirus  (SARS-CoV-2) nucleic acids may be present in the submitted sample  additional confirmatory testing may be necessary for epidemiological  and / or clinical management purposes  to differentiate between  SARS-CoV-2 and other Sarbecovirus currently known to infect humans.  If clinically indicated additional testing with an alternate test  methodology [phone]) is advised. The SARS-CoV-2 RNA is generally  detectable in upper and lower respiratory sp ecimens during the acute  phase of infection. The expected result is Negative. Fact Sheet for Patients:  StrictlyIdeas.no Fact Sheet for Healthcare Providers: BankingDealers.co.za This test is not yet approved or cleared by the  [place] FDA and has been authorized for detection and/or diagnosis of SARS-CoV-2 by FDA under an Emergency Use Authorization (EUA).  This EUA will remain in effect (meaning this test can be used) for the duration of the COVID-19 declaration under Section 564(b)(1) of the Act, 21 U.S.C. section 360bbb-3(b)(1), unless the authorization is terminated or revoked sooner. Performed at [facility], [place] [address], [place], [place] [address]   Respiratory Panel by PCR     Status: None   Collection Time: 05/29/19  3:00 AM   Specimen: Urine, Clean Catch; Respiratory  Result Value Ref Range Status   Adenovirus NOT DETECTED NOT DETECTED Final   Coronavirus 229E NOT DETECTED NOT DETECTED Final    Comment: (NOTE) The Coronavirus on the Respiratory Panel, DOES NOT test for the novel  Coronavirus (2019 nCoV)    Coronavirus HKU1 NOT DETECTED NOT DETECTED Final   Coronavirus NL63 NOT DETECTED NOT DETECTED Final   Coronavirus OC43 NOT DETECTED NOT DETECTED Final   Metapneumovirus NOT DETECTED NOT DETECTED Final   Rhinovirus / Enterovirus NOT DETECTED NOT DETECTED Final   Influenza A NOT DETECTED NOT DETECTED Final   Influenza B NOT DETECTED NOT DETECTED Final   Parainfluenza Virus 1 NOT DETECTED NOT DETECTED Final   Parainfluenza Virus 2 NOT DETECTED NOT DETECTED Final   Parainfluenza Virus 3 NOT DETECTED NOT DETECTED Final   Parainfluenza Virus 4 NOT DETECTED NOT DETECTED Final   Respiratory Syncytial Virus NOT DETECTED NOT DETECTED Final   Bordetella pertussis NOT DETECTED NOT DETECTED Final   Chlamydophila pneumoniae NOT DETECTED NOT DETECTED Final   Mycoplasma pneumoniae NOT DETECTED NOT DETECTED Final    Comment: Performed at [facility], [place] [address] [place], [place] [address]         Radiology Studies: Dg Chest Portable 1 View  Result Date: 05/28/2019 CLINICAL DATA:  Patient with cough. EXAM: PORTABLE CHEST 1 VIEW COMPARISON:  Chest radiograph 05/22/2019  FINDINGS: Monitoring leads overlie the patient. Tracheostomy tube terminates in the mid trachea. Stable cardiomegaly. Interval development of patchy consolidation within the left lower lung. No pleural effusion or pneumothorax. Thoracic spine degenerative changes. IMPRESSION: Left lower lung consolidation may represent pneumonia in the appropriate clinical setting. Followup PA and lateral chest X-ray is recommended in 3-4 weeks following trial of antibiotic therapy to ensure resolution and exclude underlying malignancy. Electronically Signed   By: Lovey Newcomer M.D.   On: 05/28/2019 18:09        Scheduled Meds: . albuterol  2.5 mg Nebulization Q6H  . budesonide (PULMICORT) nebulizer solution  0.5 mg Nebulization BID  . dextromethorphan-guaiFENesin  1 tablet Oral BID  . enoxaparin (LOVENOX) injection  40 mg Subcutaneous Q24H  . methylPREDNISolone (SOLU-MEDROL) injection  60 mg Intravenous Q6H  . sodium chloride HYPERTONIC  4 mL Nebulization Daily  . umeclidinium-vilanterol  1 puff Inhalation Daily   Continuous Infusions: . azithromycin    . cefTRIAXone (ROCEPHIN)  IV    . famotidine (PEPCID) IV       LOS: 1 day    Time spent: 35 mins.More than 50% of that time was spent in counseling and/or coordination of care.      Shelly Coss, MD [facility] Pager [phone]  If 7PM-7AM, please contact night-coverage [email or web address] Password [facility] 05/29/2019, 12:58 PM

## 2019-05-29 NOTE — Progress Notes (Signed)
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 Pulmonary consulted for mgmt of acute resp distress.   Past Medical History  HTN Anxiety COPD Vocal Cord paralysis Chronic [facility] Events   9/13: Admit  Consults:  9/13: PCCM  Procedures:  9/13: Bronch  Significant Diagnostic Tests:  05/28/19: Strep PNA Urin antigen >> Negative 05/28/19: Respiratory panel >> Negative  Micro Data:    Antimicrobials:  05/28/19 Azithromycin 05/28/19: Rocephin  Interim history/subjective:  Patient alert and oriented.  He reports improvement in his symptoms, ongoing thick secretions. Denies shortness of breath or chest pain.  Currently on 40% trach collar  Objective   Blood pressure 140/79, pulse 69, temperature 97.7 F (36.5 C), temperature source Oral, resp. rate 20, height 5\' 5"  (1.651 m), weight 64 kg, SpO2 98 %.    FiO2 (%):  [40 %-98 %] 40 %   Intake/Output Summary (Last 24 hours) at 05/29/2019 1143 Last data filed at 05/28/2019 2000 Gross per 24 hour  Intake 103.86 ml  Output -  Net 103.86 ml   Filed Weights   05/28/19 1816  Weight: 64 kg    Examination: General: Alert and comfortable HENT: [place]/AT, Trach midline Lungs: Wheezing bilaterally with rhonchi Cardiovascular: Warm and well perfused, S1/S2. Lower ext edema 1+ Abdomen: soft/nontender/nondistended Extremities: no deformity Neuro: A&O, moves extremities equally, no focal deficits.   Resolved Hospital Problem list     Assessment & Plan:  Acute respiratory insufficiency secondary to CAP & COPD, AE P:  -Continue steroids -Bronchodialtors -Empiric Abx: Azithro, Rocephin  Mucous plugging s/p bronch -Denies difficulty clearing today -Mucinex -Consider additional  hypertonic nebs if difficulty clearing  Vocal Cord Dysfunction s/p tracheostomy  Best practice:  Diet: Heart healthy diet Pain/Anxiety/Delirium protocol (if indicated): Tylenol PRN VAP protocol (if indicated): N/A DVT prophylaxis: Lovenox GI prophylaxis: Pepcid Glucose control: Controlled Mobility: as tolerated Code Status: FULL Family Communication: per primary team  Labs   CBC: Recent Labs  Lab 05/28/19 1700 05/28/19 1939  WBC 5.1  --   NEUTROABS 2.2  --   HGB 13.2 13.6  HCT 39.3 40.0  MCV 87.5  --   PLT 312  --     Basic Metabolic Panel: Recent Labs  Lab 05/28/19 1700 05/28/19 1939  NA 137 140  K 3.9 4.3  CL 107  --   CO2 23  --   GLUCOSE 90  --   BUN 8  --   CREATININE 0.74  --   CALCIUM 8.8*  --    GFR: Estimated Creatinine Clearance: 77.9 mL/min (by C-G formula based on SCr of 0.74 mg/dL). Recent Labs  Lab 05/28/19 1700  WBC 5.1  LATICACIDVEN 1.5    Liver Function Tests: No results for input(s): AST, ALT, ALKPHOS, BILITOT, PROT, ALBUMIN in the last 168 hours. No results for input(s): LIPASE, AMYLASE in the last 168 hours. No results for input(s): AMMONIA in the last 168 hours.  ABG    Component Value Date/Time   PHART 7.331 (L) 05/28/2019 1939   PCO2ART 46.4 05/28/2019 1939   PO2ART 87.0 05/28/2019 1939   HCO3 24.6 05/28/2019 1939   TCO2 26 05/28/2019 1939   ACIDBASEDEF 2.0 05/28/2019 1939   O2SAT 96.0 05/28/2019  1939     Coagulation Profile: No results for input(s): INR, PROTIME in the last 168 hours.  Cardiac Enzymes: No results for input(s): CKTOTAL, CKMB, CKMBINDEX, TROPONINI in the last 168 hours.  HbA1C: Hgb A1c MFr Bld  Date/Time Value Ref Range Status  12/24/2011 05:31 PM 5.9 (H) <5.7 % Final    Comment:    (NOTE)                                                                       According to the ADA Clinical Practice Recommendations for 2011, when HbA1c is used as a screening test:  >=6.5%   Diagnostic of Diabetes  [place]           (if abnormal result is confirmed) 5.7-6.4%   Increased risk of developing Diabetes Mellitus References:Diagnosis and Classification of Diabetes Mellitus,Diabetes Care,2011,34(Suppl 1):S62-S69 and Standards of Medical Care in         Diabetes - 2011,Diabetes [id number] (Suppl 1):S11-S61.  12/24/2011 02:30 PM 5.7 (H) <5.7 % Final    Comment:    (NOTE)                                                                       According to the ADA Clinical Practice Recommendations for 2011, when HbA1c is used as a screening test:  >=6.5%   Diagnostic of Diabetes Mellitus           (if abnormal result is confirmed) 5.7-6.4%   Increased risk of developing Diabetes Mellitus References:Diagnosis and Classification of Diabetes Mellitus,Diabetes [id number] 1):S62-S69 and Standards of Medical Care in         Diabetes - 2011,Diabetes Care,2011,34 (Suppl 1):S11-S61.    CBG: No results for input(s): GLUCAP in the last 168 hours.  Past Medical History  He,  has a past medical history of Abnormal EKG (12/24/11), Anxiety, Arthritis, Asthma, Bradycardia, sinus (12/24/11), COPD (chronic obstructive pulmonary disease) ([place]), Dyspnea, H/O tracheostomy, History of alcohol abuse, HTN (hypertension), Hypotension (12/24/11), Marijuana use, Pneumonia, Syncope and collapse (12/24/11), and Upper airway cough syndrome.   Surgical History    Past Surgical History:  Procedure Laterality Date  . CARDIAC CATHETERIZATION  12/24/11   normal coronary anatomy, EF 55-65%  . CIRCUMCISION  1972  . COLONOSCOPY WITH PROPOFOL N/A 02/24/2018   Procedure: COLONOSCOPY WITH PROPOFOL;  Surgeon: Otis Brace, MD;  Location: [facility];  Service: Gastroenterology;  Laterality: N/A;  . CYSTOSCOPY W/ RETROGRADES Bilateral 03/08/2019   Procedure: CYSTOSCOPY WITH RETROGRADE PYELOGRAM;  Surgeon: Alexis Frock, MD;  Location: [facility];  Service: Urology;  Laterality: Bilateral;  . CYSTOSCOPY WITH BIOPSY N/A  03/08/2019   Procedure: CYSTOSCOPY WITH BIOPSY WITH FUGERATION;  Surgeon: Alexis Frock, MD;  Location: [facility];  Service: Urology;  Laterality: N/A;  . ESOPHAGOGASTRODUODENOSCOPY N/A 10/09/2015   Procedure: ESOPHAGOGASTRODUODENOSCOPY (EGD);  Surgeon: Judeth Horn, MD;  Location: [facility];  Service: General;  Laterality: N/A;  . IRRIGATION AND DEBRIDEMENT KNEE Right 05/23/2017   Procedure: IRRIGATION AND DEBRIDEMENT KNEE;  Surgeon: Wylene Simmer, MD;  Location: [facility];  Service: Orthopedics;  Laterality: Right;  . LACERATION REPAIR  02/2004   arthroscopic debridement of triagular fibrocartilage tear/E-chart; right wrist  . LEFT HEART CATHETERIZATION WITH CORONARY ANGIOGRAM N/A 12/24/2011   Procedure: LEFT HEART CATHETERIZATION WITH CORONARY ANGIOGRAM;  Surgeon: Peter M Martinique, MD;  [facility];  Service: Cardiovascular;  Laterality: N/A;  . PEG PLACEMENT N/A 10/09/2015   Procedure: PERCUTANEOUS ENDOSCOPIC GASTROSTOMY (PEG) PLACEMENT;  Surgeon: Judeth Horn, MD;  Location: [facility];  Service: General;  Laterality: N/A;  . PEG TUBE REMOVAL    . PERCUTANEOUS TRACHEOSTOMY N/A 10/09/2015   Procedure: PERCUTANEOUS TRACHEOSTOMY;  Surgeon: Judeth Horn, MD;  Location: [place];  Service: General;  Laterality: N/A;  . POLYPECTOMY  02/24/2018   Procedure: POLYPECTOMY;  Surgeon: Otis Brace, MD;  Location: [facility];  Service: Gastroenterology;;  . SINUS ENDO WITH FUSION  06/07/2018  . SINUS ENDO WITH FUSION Bilateral 06/07/2018   Procedure: SINUS ENDO WITH FUSION;  Surgeon: Melissa Montane, MD;  Location: [place];  Service: ENT;  Laterality: Bilateral;  with fuision     Social History   reports that he has never smoked. He has never used smokeless tobacco. He reports current alcohol use of about 1.0 standard drinks of alcohol per week. He reports previous drug use. Drug: Marijuana.   Family History   His family history includes Asthma in an other family member; COPD in his sister; Cancer in his  sister.   Allergies No Known Allergies   Home Medications  Prior to Admission medications   Medication Sig Start Date End Date Taking? Authorizing Provider  albuterol (PROVENTIL HFA;VENTOLIN HFA) 108 (90 Base) MCG/ACT inhaler Inhale 2 puffs into the lungs every 6 (six) hours as needed for wheezing or shortness of breath. 10/02/18   Volanda Napoleon, PA-C  albuterol (PROVENTIL) (2.5 MG/3ML) 0.083% nebulizer solution Take 3-6 mLs (2.5-5 mg total) by nebulization every 4 (four) hours as needed for wheezing or shortness of breath. 10/02/18   Volanda Napoleon, PA-C  amoxicillin (AMOXIL) 500 MG capsule Take 1 capsule (500 mg total) by mouth 3 (three) times daily. 05/20/19   Domenic Moras, PA-C  azithromycin (ZITHROMAX Z-PAK) 250 MG tablet 2 po day one, then 1 daily x 4 days 05/20/19   Domenic Moras, PA-C  budesonide-formoterol [facility]) 160-4.5 MCG/ACT inhaler Take 2 puffs first thing in am and then another 2 puffs about 12 hours later. Patient taking differently: Inhale 2 puffs into the lungs 2 (two) times daily.  10/02/18   Volanda Napoleon, PA-C  finasteride (PROSCAR) 5 MG tablet Take 5 mg by mouth daily.    [provider]  fluticasone (FLONASE) 50 MCG/ACT nasal spray Place 2 sprays into both nostrils daily.    [provider]  guaiFENesin (MUCINEX) 600 MG 12 hr tablet Take 600 mg by mouth 2 (two) times daily.    [provider]  hydrochlorothiazide (HYDRODIURIL) 25 MG tablet Take 1 tablet (25 mg total) by mouth daily. Patient not taking: Reported on 05/20/2019 05/30/18   Lavina Hamman, MD  Multiple Vitamin (MULTIVITAMIN) tablet Take 1 tablet by mouth daily.    [provider]  pantoprazole (PROTONIX) 40 MG tablet TAKE 1 TABLET(40 MG) BY MOUTH DAILY 30 TO 60 MINUTES BEFORE FIRST MEAL OF THE DAY Patient taking differently: Take 40 mg by mouth daily before breakfast.  05/09/19   Tanda Rockers, MD  terazosin (HYTRIN) 5 MG capsule Take 5 mg by  mouth at bedtime.  02/08/19    [provider]  traMADol (ULTRAM) 50 MG tablet Take 1-2 tablets (50-100 mg total) by mouth every 6 (six) hours as needed for moderate pain or severe pain. Post-operatively 03/08/19   Alexis Frock, MD     Critical care time:  Paulita Fujita, ACNP [facility]  Pager: [phone]

## 2019-05-29 NOTE — Progress Notes (Signed)
Pt resting but continues to cough.

## 2019-05-29 NOTE — Progress Notes (Signed)
Pt c/o sob coughing. O2: 89-92%. Pt suctioned with little sputum obtained. Pt still c/o not being able to catch his breath. Richardson Landry with respiratory notified and Richardson Landry stated someone would be on after shift change. This RN stressed that pt looks distressed.

## 2019-05-29 NOTE — Progress Notes (Signed)
PRN breathing treatment given see MAR

## 2019-05-30 LAB — LEGIONELLA PNEUMOPHILA SEROGP 1 UR AG: L. pneumophila Serogp 1 Ur Ag: NEGATIVE

## 2019-05-30 MED ORDER — CHLORHEXIDINE GLUCONATE 0.12 % MT SOLN
15.0000 mL | Freq: Two times a day (BID) | OROMUCOSAL | Status: DC
  Administered 2019-05-30: 15 mL via OROMUCOSAL
  Filled 2019-05-30: qty 15

## 2019-05-30 MED ORDER — AZITHROMYCIN 500 MG PO TABS: 500.0000 mg | ORAL_TABLET | Freq: Every day | ORAL | 0 refills | Status: AC

## 2019-05-30 MED ORDER — CEFDINIR 300 MG PO CAPS: 300.0000 mg | ORAL_CAPSULE | Freq: Two times a day (BID) | ORAL | 0 refills | Status: AC

## 2019-05-30 MED ORDER — PREDNISONE 20 MG PO TABS: 40.0000 mg | ORAL_TABLET | Freq: Every day | ORAL | 0 refills | Status: AC

## 2019-05-30 MED ORDER — ORAL CARE MOUTH RINSE: 15.0000 mL | Freq: Two times a day (BID) | OROMUCOSAL | Status: DC

## 2019-05-30 NOTE — Discharge Summary (Signed)
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  He reported 3-day history of shortness of breath and wheezing.  He had been noticing thick mucus from tracheostomy.  On arrival he was found to be tachypneic.  He was saturating 86% on room air.  Chest x-ray showed left lower lobe consolidation.  Patient started on bronchodilators, steroids.  Suspected to have mucous plugging.  Underwent bronchoscopy.  He was being managed for community acquired pneumonia/COPD exacerbation.  PCCM was also following.  Patient is currently hemodynamically stable.  Respiratory status is stable.  He did not need supplemental oxygen requirement.  He is hemodynamically stable for discharge to home today.   Following problems were addressed during his hospitalization:   Acute respiratory distress: Suspected to be from mucous plug, CAP, COPD exacerbation.  Respiratory status has significantly improved today and is currently near his baseline.    Continue antibiotics, steroids at home.  Mucous plugging: Presented with thick secretions, difficulty with clearance.  Given hypertonic nebulization 3%.  Underwent bronchoscopy.  Continue mucolytics.  Vocal cord cord paralysis: Chronic.  He has chronic tracheostomy size 6 nonfenestrated uncuffed.  Community-acquired pneumonia: Chest x-ray showed left lower lobe consolidation.    He is afebrile, no leukocytosis.    Respiratory viral panel negative.  Antibiotics  changed to oral.  COPD exacerbation: Continue short course steroid therapy.  He has inhalers at home.  [facility]   Hypertension: Currently blood pressure stable.  Continue current regimen.   Discharge Diagnoses:  Principal Problem:   Acute respiratory failure with hypoxia ([place]) Active Problems:   HTN (hypertension)   CAP (community acquired pneumonia)   Acute respiratory distress   Vocal cord dysfunction    Discharge Instructions  Discharge Instructions    Diet - low sodium heart healthy   Complete by: As directed    Discharge instructions   Complete by: As directed    1)Please follow up with your PCP in a week. 2)Take prescribed medications as instructed.   Increase activity slowly   Complete by: As directed      Allergies as of 05/30/2019   No Known Allergies     Medication List    STOP taking these medications   amoxicillin 500 MG capsule Commonly known as: AMOXIL     TAKE these medications   albuterol 108 (90 Base) MCG/ACT inhaler Commonly known as: VENTOLIN HFA Inhale 2 puffs into the lungs every 6 (six) hours as needed for wheezing or shortness of breath.   albuterol (2.5 MG/3ML) 0.083% nebulizer solution Commonly known as: PROVENTIL Take 3-6 mLs (2.5-5 mg total) by nebulization every 4 (four) hours as needed for wheezing or shortness of breath.   azithromycin 500 MG tablet Commonly known as: Zithromax Take 1 tablet (500 mg total) by mouth daily for 3 days. Take 1 tablet daily for 3 days. What changed:   medication strength  how much to take  how to take this  when to take this  additional instructions   budesonide-formoterol 160-4.5 MCG/ACT inhaler Commonly known as: Symbicort Take 2 puffs first thing in am  and then another 2 puffs about 12 hours later. What changed:   how much to take  how to take this  when to take this  additional instructions   cefdinir 300 MG capsule Commonly known as: OMNICEF Take 1 capsule (300  mg total) by mouth 2 (two) times daily for 5 days.   cetirizine 10 MG tablet Commonly known as: ZYRTEC Take 10 mg by mouth daily.   Ciclopirox 0.77 % gel Apply 1 application topically 2 (two) times daily. Apply to nails   finasteride 5 MG tablet Commonly known as: PROSCAR Take 5 mg by mouth daily.   fluticasone 50 MCG/ACT nasal spray Commonly known as: FLONASE Place 2 sprays into both nostrils daily.   guaiFENesin 600 MG 12 hr tablet Commonly known as: MUCINEX Take 600 mg by mouth 2 (two) times daily.   hydrochlorothiazide 25 MG tablet Commonly known as: HYDRODIURIL Take 1 tablet (25 mg total) by mouth daily.   losartan 25 MG tablet Commonly known as: COZAAR Take 25 mg by mouth daily.   multivitamin tablet Take 1 tablet by mouth daily.   pantoprazole 40 MG tablet Commonly known as: PROTONIX TAKE 1 TABLET(40 MG) BY MOUTH DAILY 30 TO 60 MINUTES BEFORE FIRST MEAL OF THE DAY What changed:   how much to take  how to take this  when to take this  additional instructions   predniSONE 20 MG tablet Commonly known as: Deltasone Take 2 tablets (40 mg total) by mouth daily for 5 days. Start taking on: May 31, 2019   terazosin 5 MG capsule Commonly known as: HYTRIN Take 5 mg by mouth at bedtime.   traMADol 50 MG tablet Commonly known as: ULTRAM Take 1-2 tablets (50-100 mg total) by mouth every 6 (six) hours as needed for moderate pain or severe pain. Post-operatively      Follow-up Information    Caren Macadam, MD. Schedule an appointment as soon as possible for a visit in 1 week(s).   Specialty: Family Medicine Contact information: [place] [phone]          No Known Allergies  Consultations:  PCCM   Procedures/Studies: Dg Chest Portable 1 View  Result Date: 05/28/2019 CLINICAL DATA:  Patient with cough. EXAM: PORTABLE CHEST 1 VIEW COMPARISON:  Chest radiograph 05/22/2019 FINDINGS: Monitoring leads  overlie the patient. Tracheostomy tube terminates in the mid trachea. Stable cardiomegaly. Interval development of patchy consolidation within the left lower lung. No pleural effusion or pneumothorax. Thoracic spine degenerative changes. IMPRESSION: Left lower lung consolidation may represent pneumonia in the appropriate clinical setting. Followup PA and lateral chest X-ray is recommended in 3-4 weeks following trial of antibiotic therapy to ensure resolution and exclude underlying malignancy. Electronically Signed   By: Lovey Newcomer M.D.   On: 05/28/2019 18:09   Dg Chest Portable 1 View  Result Date: 05/22/2019 CLINICAL DATA:  Shortness of breath.  COPD. EXAM: PORTABLE CHEST 1 VIEW COMPARISON:  05/20/2019 FINDINGS: Tracheostomy tube appears to be well positioned. Lungs are clear without airspace disease or pulmonary edema. Heart and mediastinum are within normal limits. Atherosclerotic calcifications at the aortic arch. Negative for a pneumothorax. Bone structures are intact. IMPRESSION: 1. No acute cardiopulmonary disease. 2. Tracheostomy tube. Electronically Signed   By: Markus Daft M.D.   On: 05/22/2019 09:49   Dg Chest Port 1 View  Result Date: 05/20/2019 CLINICAL DATA:  Shortness of breath and wheezing. Patient has a trach. EXAM: PORTABLE CHEST 1 VIEW COMPARISON:  CT chest 02/10/2019, chest radiograph 02/10/2019, 12/13/2018 FINDINGS: Stable cardiomediastinal contours with tracheostomy in place. No pneumothorax or large pleural effusion. There are reticular opacities in the bilateral left greater than right lower lungs similar in appearance to the radiograph from 02/10/2019. Visualized portions of the skeleton are unremarkable. IMPRESSION: Reticular opacities in the bilateral left greater than right lower lungs similar in appearance to the prior radiograph from May 2020 is suspicious for recurrent aspiration/infection. Some of this may represent underlying scarring or atelectasis. Electronically Signed    By: Audie Pinto M.D.   On: 05/20/2019 11:50       Subjective:  Patient seen and examined the bedside this morning.  Currently hemodynamically stable for discharge.  Discharge Exam: Vitals:   05/30/19 0807 05/30/19 0826  BP:  124/77  Pulse: 99 78  Resp: 20 20  Temp:  98.1 F (36.7 C)  SpO2: 97% 97%   Vitals:   05/30/19 0526 05/30/19 0743 05/30/19 0807 05/30/19 0826  BP: (!) 142/81   124/77  Pulse: (!) 53  99 78  Resp: 20  20 20   Temp: 98.1 F (36.7 C)   98.1 F (36.7 C)  TempSrc: Oral   Oral  SpO2: 100% 96% 97% 97%  Weight:      Height:        General: Pt is alert, awake, not in acute distress Cardiovascular: RRR, S1/S2 +, no rubs, no gallops Respiratory: CTA bilaterally, no wheezing, no rhonchi Abdominal: Soft, NT, ND, bowel sounds + Extremities: no edema, no cyanosis    The results of significant diagnostics from this hospitalization (including imaging, microbiology, ancillary and laboratory) are listed below for reference.     Microbiology: Recent Results (from the past 240 hour(s))  SARS Coronavirus 2 [facility] order, Performed in [facility] hospital lab) Nasopharyngeal Nasopharyngeal Swab     Status: None   Collection Time: 05/28/19  5:25 PM   Specimen: Nasopharyngeal Swab  Result Value Ref Range Status   SARS Coronavirus 2 NEGATIVE NEGATIVE Final    Comment: (NOTE) If result is NEGATIVE SARS-CoV-2 target nucleic acids are NOT DETECTED. The SARS-CoV-2 RNA is generally detectable in upper and lower  respiratory specimens during the acute phase of infection. The lowest  concentration of SARS-CoV-2 viral copies this assay can detect is 250  copies / mL. A negative result does not preclude SARS-CoV-2 infection  and should not be used as the sole basis for treatment or other  patient management decisions.  A negative result may occur with  improper specimen collection / handling, submission of specimen other  than nasopharyngeal swab, presence of  viral mutation(s) within the  areas targeted by this assay, and inadequate number of viral copies  (<250 copies / mL). A negative result must be combined with clinical  observations, patient history, and epidemiological information. If result is POSITIVE SARS-CoV-2 target nucleic acids are DETECTED. The SARS-CoV-2 RNA is generally detectable in upper and lower  respiratory specimens dur ing the acute phase of infection.  Positive  results are indicative of active infection with SARS-CoV-2.  Clinical  correlation with patient history and other diagnostic information is  necessary to determine patient infection status.  Positive results do  not rule out bacterial infection or co-infection with other viruses. If result is PRESUMPTIVE POSTIVE SARS-CoV-2 nucleic acids MAY BE PRESENT.   A presumptive positive result was obtained on the submitted specimen  and confirmed on repeat testing.  While 2019 novel coronavirus  (SARS-CoV-2) nucleic acids may be present  in the submitted sample  additional confirmatory testing may be necessary for epidemiological  and / or clinical management purposes  to differentiate between  SARS-CoV-2 and other Sarbecovirus currently known to infect humans.  If clinically indicated additional testing with an alternate test  methodology [phone]) is advised. The SARS-CoV-2 RNA is generally  detectable in upper and lower respiratory sp ecimens during the acute  phase of infection. The expected result is Negative. Fact Sheet for Patients:  StrictlyIdeas.no Fact Sheet for Healthcare Providers: BankingDealers.co.za This test is not yet approved or cleared by the [place] FDA and has been authorized for detection and/or diagnosis of SARS-CoV-2 by FDA under an Emergency Use Authorization (EUA).  This EUA will remain in effect (meaning this test can be used) for the duration of the COVID-19 declaration under Section  564(b)(1) of the Act, 21 U.S.C. section 360bbb-3(b)(1), unless the authorization is terminated or revoked sooner. Performed at [facility], [place] [address], [place], [place] [address]   Respiratory Panel by PCR     Status: None   Collection Time: 05/29/19  3:00 AM   Specimen: Urine, Clean Catch; Respiratory  Result Value Ref Range Status   Adenovirus NOT DETECTED NOT DETECTED Final   Coronavirus 229E NOT DETECTED NOT DETECTED Final    Comment: (NOTE) The Coronavirus on the Respiratory Panel, DOES NOT test for the novel  Coronavirus (2019 nCoV)    Coronavirus HKU1 NOT DETECTED NOT DETECTED Final   Coronavirus NL63 NOT DETECTED NOT DETECTED Final   Coronavirus OC43 NOT DETECTED NOT DETECTED Final   Metapneumovirus NOT DETECTED NOT DETECTED Final   Rhinovirus / Enterovirus NOT DETECTED NOT DETECTED Final   Influenza A NOT DETECTED NOT DETECTED Final   Influenza B NOT DETECTED NOT DETECTED Final   Parainfluenza Virus 1 NOT DETECTED NOT DETECTED Final   Parainfluenza Virus 2 NOT DETECTED NOT DETECTED Final   Parainfluenza Virus 3 NOT DETECTED NOT DETECTED Final   Parainfluenza Virus 4 NOT DETECTED NOT DETECTED Final   Respiratory Syncytial Virus NOT DETECTED NOT DETECTED Final   Bordetella pertussis NOT DETECTED NOT DETECTED Final   Chlamydophila pneumoniae NOT DETECTED NOT DETECTED Final   Mycoplasma pneumoniae NOT DETECTED NOT DETECTED Final    Comment: Performed at [facility], [place] [address] [place], [place] [address]     Labs: BNP (last 3 results) Recent Labs    05/20/19 1125  BNP [id number]   Basic Metabolic Panel: Recent Labs  Lab 05/28/19 1700 05/28/19 1939  NA 137 140  K 3.9 4.3  CL 107  --   CO2 23  --   GLUCOSE 90  --   BUN 8  --   CREATININE 0.74  --   CALCIUM 8.8*  --    Liver Function Tests: No results for input(s): AST, ALT, ALKPHOS, BILITOT, PROT, ALBUMIN in the last 168 hours. No results for input(s): LIPASE, AMYLASE in the last 168  hours. No results for input(s): AMMONIA in the last 168 hours. CBC: Recent Labs  Lab 05/28/19 1700 05/28/19 1939  WBC 5.1  --   NEUTROABS 2.2  --   HGB 13.2 13.6  HCT 39.3 40.0  MCV 87.5  --   PLT 312  --    Cardiac Enzymes: No results for input(s): CKTOTAL, CKMB, CKMBINDEX, TROPONINI in the last 168 hours. BNP: Invalid input(s): POCBNP CBG: No results for input(s): GLUCAP in the last 168 hours. D-Dimer No results for input(s): DDIMER in the last 72  hours. Hgb A1c No results for input(s): HGBA1C in the last 72 hours. Lipid Profile No results for input(s): CHOL, HDL, LDLCALC, TRIG, CHOLHDL, LDLDIRECT in the last 72 hours. Thyroid function studies No results for input(s): TSH, T4TOTAL, T3FREE, THYROIDAB in the last 72 hours.  Invalid input(s): FREET3 Anemia work up No results for input(s): VITAMINB12, FOLATE, FERRITIN, TIBC, IRON, RETICCTPCT in the last 72 hours. Urinalysis    Component Value Date/Time   COLORURINE STRAW (A) 09/26/2016 1620   APPEARANCEUR CLEAR 09/26/2016 1620   LABSPEC 1.006 09/26/2016 1620   PHURINE 6.0 09/26/2016 1620   GLUCOSEU NEGATIVE 09/26/2016 1620   HGBUR SMALL (A) 09/26/2016 1620   BILIRUBINUR NEGATIVE 09/26/2016 1620   KETONESUR NEGATIVE 09/26/2016 1620   PROTEINUR NEGATIVE 09/26/2016 1620   UROBILINOGEN 0.2 09/30/2013 0224   NITRITE NEGATIVE 09/26/2016 1620   LEUKOCYTESUR NEGATIVE 09/26/2016 1620   Sepsis Labs Invalid input(s): PROCALCITONIN,  WBC,  LACTICIDVEN Microbiology Recent Results (from the past 240 hour(s))  SARS Coronavirus 2 [facility] order, Performed in [facility] hospital lab) Nasopharyngeal Nasopharyngeal Swab     Status: None   Collection Time: 05/28/19  5:25 PM   Specimen: Nasopharyngeal Swab  Result Value Ref Range Status   SARS Coronavirus 2 NEGATIVE NEGATIVE Final    Comment: (NOTE) If result is NEGATIVE SARS-CoV-2 target nucleic acids are NOT DETECTED. The SARS-CoV-2 RNA is generally detectable in upper and  lower  respiratory specimens during the acute phase of infection. The lowest  concentration of SARS-CoV-2 viral copies this assay can detect is 250  copies / mL. A negative result does not preclude SARS-CoV-2 infection  and should not be used as the sole basis for treatment or other  patient management decisions.  A negative result may occur with  improper specimen collection / handling, submission of specimen other  than nasopharyngeal swab, presence of viral mutation(s) within the  areas targeted by this assay, and inadequate number of viral copies  (<250 copies / mL). A negative result must be combined with clinical  observations, patient history, and epidemiological information. If result is POSITIVE SARS-CoV-2 target nucleic acids are DETECTED. The SARS-CoV-2 RNA is generally detectable in upper and lower  respiratory specimens dur ing the acute phase of infection.  Positive  results are indicative of active infection with SARS-CoV-2.  Clinical  correlation with patient history and other diagnostic information is  necessary to determine patient infection status.  Positive results do  not rule out bacterial infection or co-infection with other viruses. If result is PRESUMPTIVE POSTIVE SARS-CoV-2 nucleic acids MAY BE PRESENT.   A presumptive positive result was obtained on the submitted specimen  and confirmed on repeat testing.  While 2019 novel coronavirus  (SARS-CoV-2) nucleic acids may be present in the submitted sample  additional confirmatory testing may be necessary for epidemiological  and / or clinical management purposes  to differentiate between  SARS-CoV-2 and other Sarbecovirus currently known to infect humans.  If clinically indicated additional testing with an alternate test  methodology [phone]) is advised. The SARS-CoV-2 RNA is generally  detectable in upper and lower respiratory sp ecimens during the acute  phase of infection. The expected result is  Negative. Fact Sheet for Patients:  StrictlyIdeas.no Fact Sheet for Healthcare Providers: BankingDealers.co.za This test is not yet approved or cleared by the [place] FDA and has been authorized for detection and/or diagnosis of SARS-CoV-2 by FDA under an Emergency Use Authorization (EUA).  This EUA will remain in effect (meaning this test  can be used) for the duration of the COVID-19 declaration under Section 564(b)(1) of the Act, 21 U.S.C. section 360bbb-3(b)(1), unless the authorization is terminated or revoked sooner. Performed at [facility], [place] [address] [place], [place] [address]   Respiratory Panel by PCR     Status: None   Collection Time: 05/29/19  3:00 AM   Specimen: Urine, Clean Catch; Respiratory  Result Value Ref Range Status   Adenovirus NOT DETECTED NOT DETECTED Final   Coronavirus 229E NOT DETECTED NOT DETECTED Final    Comment: (NOTE) The Coronavirus on the Respiratory Panel, DOES NOT test for the novel  Coronavirus (2019 nCoV)    Coronavirus HKU1 NOT DETECTED NOT DETECTED Final   Coronavirus NL63 NOT DETECTED NOT DETECTED Final   Coronavirus OC43 NOT DETECTED NOT DETECTED Final   Metapneumovirus NOT DETECTED NOT DETECTED Final   Rhinovirus / Enterovirus NOT DETECTED NOT DETECTED Final   Influenza A NOT DETECTED NOT DETECTED Final   Influenza B NOT DETECTED NOT DETECTED Final   Parainfluenza Virus 1 NOT DETECTED NOT DETECTED Final   Parainfluenza Virus 2 NOT DETECTED NOT DETECTED Final   Parainfluenza Virus 3 NOT DETECTED NOT DETECTED Final   Parainfluenza Virus 4 NOT DETECTED NOT DETECTED Final   Respiratory Syncytial Virus NOT DETECTED NOT DETECTED Final   Bordetella pertussis NOT DETECTED NOT DETECTED Final   Chlamydophila pneumoniae NOT DETECTED NOT DETECTED Final   Mycoplasma pneumoniae NOT DETECTED NOT DETECTED Final    Comment: Performed at [facility], [place] [address]  [place], [place] [address]    Please note: You were cared for by a hospitalist during your hospital stay. Once you are discharged, your primary care physician will handle any further medical issues. Please note that NO REFILLS for any discharge medications will be authorized once you are discharged, as it is imperative that you return to your primary care physician (or establish a relationship with a primary care physician if you do not have one) for your post hospital discharge needs so that they can reassess your need for medications and monitor your lab values.    Time coordinating discharge: 40 minutes  SIGNED:   Shelly Coss, MD  [facility] 05/30/2019, 10:42 AM Pager [phone]  If 7PM-7AM, please contact night-coverage [email or web address] Password [email or web address]

## 2019-05-30 NOTE — Evaluation (Addendum)
Physical Therapy Evaluation/ Discharge [name] MRN: [id number] DOB: 1951-07-19 Today's Date: 05/30/2019   History of Present Illness  68 year old male admitted with respiratory distress with CAP and mucous plugging s/p bronchoscopy. 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 Pt able to perform all transfers, gait and mobility without assist with great balance and SpO2 maintained >90% on RA throughout. PT encouraged to purchase pulse oximeter for home use and to continue walking acutely. No further therapy needs with pt aware and agreeable.      Follow Up Recommendations No PT follow up    Equipment Recommendations  None recommended by PT    Recommendations for Other Services       Precautions / Restrictions Precautions Precaution Comments: trach      Mobility  Bed Mobility Overal bed mobility: Independent                Transfers Overall transfer level: Independent                  Ambulation/Gait Ambulation/Gait assistance: Independent Gait Distance (Feet): 600 Feet Assistive device: None Gait Pattern/deviations: WFL(Within Functional Limits)   Gait velocity interpretation: >4.37 ft/sec, indicative of normal walking speed General Gait Details: pt with good safety and speed with SpO2 90-97% on RA throughout gait  Stairs            Wheelchair Mobility    Modified Rankin (Stroke Patients Only)       Balance Overall balance assessment: No apparent balance deficits (not formally assessed)                                           Pertinent Vitals/Pain Pain Assessment: No/denies pain    Home Living Family/patient expects to be discharged to:: Private residence   Available Help at Discharge: Family;Available PRN/intermittently Type of Home: Apartment Home  Access: Elevator     Home Layout: One level Home Equipment: Walker - 4 wheels;Cane - single point;Wheelchair - manual;Grab bars - toilet;Grab bars - tub/shower;Shower seat      Prior Function Level of Independence: Independent         Comments: loves to ride motorcycles     Hand Dominance        Extremity/Trunk Assessment   Upper Extremity Assessment Upper Extremity Assessment: Overall WFL for tasks assessed    Lower Extremity Assessment Lower Extremity Assessment: Overall WFL for tasks assessed    Cervical / Trunk Assessment Cervical / Trunk Assessment: Normal  Communication   Communication: Tracheostomy  Cognition Arousal/Alertness: Awake/alert Behavior During Therapy: WFL for tasks assessed/performed Overall Cognitive Status: Within Functional Limits for tasks assessed                                        General Comments      Exercises     Assessment/Plan    PT Assessment Patent does not need any further PT services  PT Problem List         PT Treatment Interventions      PT Goals (Current goals can be found in the Care Plan section)  Acute Rehab PT Goals PT Goal Formulation: All assessment and  education complete, DC therapy    Frequency     Barriers to discharge        Co-evaluation               AM-PAC PT "6 Clicks" Mobility  Outcome Measure Help needed turning from your back to your side while in a flat bed without using bedrails?: None Help needed moving from lying on your back to sitting on the side of a flat bed without using bedrails?: None Help needed moving to and from a bed to a chair (including a wheelchair)?: None Help needed standing up from a chair using your arms (e.g., wheelchair or bedside chair)?: None Help needed to walk in hospital room?: None Help needed climbing 3-5 steps with a railing? : None 6 Click Score: 24    End of Session   Activity Tolerance: Patient tolerated treatment  well Patient left: in chair;with call bell/phone within reach;with nursing/sitter in room Nurse Communication: Mobility status PT Visit Diagnosis: Other abnormalities of gait and mobility (R26.89)    [id number] PT Time Calculation (min) (ACUTE ONLY): 19 min   Charges:   PT Evaluation $PT Eval Low Complexity: [place], PT Acute Rehabilitation Services Pager: [phone] Office: [phone]   Sandy Salaam Clancy Leiner 05/30/2019, 12:43 PM

## 2019-05-30 NOTE — Progress Notes (Signed)
IV and telemetry discontinued. CCMD notified. Discharge instructions reviewed with patient. All questions answered.  

## 2019-05-31 DIAGNOSIS — J9601 Acute respiratory failure with hypoxia: Secondary | ICD-10-CM | POA: Diagnosis not present

## 2019-06-01 ENCOUNTER — Other Ambulatory Visit: Payer: Self-pay | Admitting: *Deleted

## 2019-06-01 NOTE — Patient Outreach (Signed)
[facility]) Care Management  06/01/2019   Aristotelis Vilardi Sep 14, 1951 [id number]  RN Health Coach telephone call to patient.  Hipaa compliance verified. Per patient he was admitted to hospital due to a mucus plug and pneumonia. Per patient it was thick sputum and he could not cough it up. Patient is using his inhalers and nebulizer as per ordered. Perr patient he is feeling much better. Patient is able to speak without any difficulty. Patient understands the COPD zones and action plan. Per patient his appetite is good but he would like to gain a little weight. Patient has agreed to follow up outreach calls.   Current Medications:  Current Outpatient Medications  Medication Sig Dispense Refill  . albuterol (PROVENTIL HFA;VENTOLIN HFA) 108 (90 Base) MCG/ACT inhaler Inhale 2 puffs into the lungs every 6 (six) hours as needed for wheezing or shortness of breath. 1 Inhaler 2  . albuterol (PROVENTIL) (2.5 MG/3ML) 0.083% nebulizer solution Take 3-6 mLs (2.5-5 mg total) by nebulization every 4 (four) hours as needed for wheezing or shortness of breath. 30 vial 0  . azithromycin (ZITHROMAX) 500 MG tablet Take 1 tablet (500 mg total) by mouth daily for 3 days. Take 1 tablet daily for 3 days. 3 tablet 0  . budesonide-formoterol (SYMBICORT) 160-4.5 MCG/ACT inhaler Take 2 puffs first thing in am and then another 2 puffs about 12 hours later. (Patient taking differently: Inhale 2 puffs into the lungs 2 (two) times daily. ) 1 Inhaler 11  . cefdinir (OMNICEF) 300 MG capsule Take 1 capsule (300 mg total) by mouth 2 (two) times daily for 5 days. 10 capsule 0  . cetirizine (ZYRTEC) 10 MG tablet Take 10 mg by mouth daily.    . Ciclopirox 0.77 % gel Apply 1 application topically 2 (two) times daily. Apply to nails    . finasteride (PROSCAR) 5 MG tablet Take 5 mg by mouth daily.    . fluticasone (FLONASE) 50 MCG/ACT nasal spray Place 2 sprays into both nostrils daily.    Marland Kitchen guaiFENesin (MUCINEX) 600  MG 12 hr tablet Take 600 mg by mouth 2 (two) times daily.    . hydrochlorothiazide (HYDRODIURIL) 25 MG tablet Take 1 tablet (25 mg total) by mouth daily. 30 tablet 0  . losartan (COZAAR) 25 MG tablet Take 25 mg by mouth daily.    . Multiple Vitamin (MULTIVITAMIN) tablet Take 1 tablet by mouth daily.    . pantoprazole (PROTONIX) 40 MG tablet TAKE 1 TABLET(40 MG) BY MOUTH DAILY 30 TO 60 MINUTES BEFORE FIRST MEAL OF THE DAY (Patient taking differently: Take 40 mg by mouth daily before breakfast. ) 90 tablet 3  . predniSONE (DELTASONE) 20 MG tablet Take 2 tablets (40 mg total) by mouth daily for 5 days. 10 tablet 0  . terazosin (HYTRIN) 5 MG capsule Take 5 mg by mouth at bedtime.     . traMADol (ULTRAM) 50 MG tablet Take 1-2 tablets (50-100 mg total) by mouth every 6 (six) hours as needed for moderate pain or severe pain. Post-operatively 20 tablet 0   No current facility-administered medications for this visit.     Functional Status:  In your present state of health, do you have any difficulty performing the following activities: 06/01/2019 03/02/2019  Hearing? N N  Vision? N N  Difficulty concentrating or making decisions? N N  Walking or climbing stairs? Tempie Donning  Comment gets short of breath on moderate exertion due to trach and breathing  Dressing or bathing? N N  Doing errands, shopping? N N  Preparing Food and eating ? N N  Using the Toilet? N N  In the past six months, have you accidently leaked urine? Y Y  Do you have problems with loss of bowel control? N N  Managing your Medications? N N  Managing your Finances? N N  Housekeeping or managing your Housekeeping? N N  Some recent data might be hidden    Fall/Depression Screening: Fall Risk  06/01/2019 03/02/2019 10/26/2018  Falls in the past year? 0 0 0  Injury with Fall? 0 - -  Follow up Falls evaluation completed - -   PHQ 2/9 Scores 06/01/2019 03/02/2019 10/05/2018  PHQ - 2 Score 0 0 0   THN CM Care Plan Problem One     Most Recent  Value  Care Plan Problem One  Knowledge Deficit in Self Management of COPD  Role Documenting the Problem One  [place] for Problem One  Active  THN Long Term Goal   Patient will not have any admissions for COPD exacerbation within the next 90 days  Interventions for Problem One Long Term Goal  (S) Patient went to hospital for pneumonia and a mucus plug. RN reiterated the copd zones and action plan. RN will follow up with further discussion  THN CM Short Term Goal #1   Patient will verbalize having a better understanding of COPD and eating plan within the next 30 days  THN CM Short Term Goal #1 Met Date  06/01/19  [facility] CM Short Term Goal #2   Patient will verbalize having a better understanding of CCOPD and physical activity within the next 30 days  THN CM Short Term Goal #2 Met Date  06/01/19  [facility] CM Short Term Goal #3  Patient will follo wup with Health Care Maintenance within the next 30 days  THN CM Short Term Goal #3 Start Date  06/01/19  Interventions for Short Tern Goal #3  Rn discussed Health Maintenance. RN discussed following up with flu sshot and eye care. RN will follow up for compliance  THN CM Short Term Goal #4  Patient will verbalize ordering an O2 sat machine form [employer] catalog within the next 30 days  THN CM Short Term Goal #4 Start Date  06/01/19  Interventions for Short Term Goal #4  RN discussed with patient the benefits of having a fingertip pulse oximeter. With patint past trach and COPD he will bw able to monitor his own oxygen levels and know before he gets in severe crisis. RN will send page number in catalog and follow up for further discussion      Assessment:  Patient had recent admission Patient is using nebulizer and inhalers as per Dr order Patient has a good appetite Patient is taking medications as per ordered Plan:  RN discussed with patient about getting a finger pulse oximetry RN sent Clinical key education on oximetry RN reiterated the zones  and action plan RN reiterated Health Maintenance RN will follow up within the month of October RN sent ensure and boost coupons  [place] Management [phone]

## 2019-06-02 DIAGNOSIS — J3802 Paralysis of vocal cords and larynx, bilateral: Secondary | ICD-10-CM | POA: Diagnosis not present

## 2019-06-02 DIAGNOSIS — Z430 Encounter for attention to tracheostomy: Secondary | ICD-10-CM | POA: Diagnosis not present

## 2019-06-02 DIAGNOSIS — J386 Stenosis of larynx: Secondary | ICD-10-CM | POA: Diagnosis not present

## 2019-06-02 DIAGNOSIS — Z930 Tracheostomy status: Secondary | ICD-10-CM | POA: Diagnosis not present

## 2019-06-07 DIAGNOSIS — J449 Chronic obstructive pulmonary disease, unspecified: Secondary | ICD-10-CM | POA: Diagnosis not present

## 2019-06-07 DIAGNOSIS — J3800 Paralysis of vocal cords and larynx, unspecified: Secondary | ICD-10-CM | POA: Diagnosis not present

## 2019-06-07 DIAGNOSIS — R0982 Postnasal drip: Secondary | ICD-10-CM | POA: Diagnosis not present

## 2019-06-21 ENCOUNTER — Other Ambulatory Visit: Payer: Self-pay | Admitting: *Deleted

## 2019-06-21 NOTE — Patient Outreach (Signed)
[facility]) Care Management  06/21/2019  [name] [name] 10/06/1950 [id number]   RN Health Coach attempted follow up outreach call to patient.  Patient was unavailable. HIPPA compliance voicemail message left with return callback number.  Plan: RN will call patient again within 30 days.  [place] Care Management [phone]

## 2019-06-22 DIAGNOSIS — L603 Nail dystrophy: Secondary | ICD-10-CM | POA: Diagnosis not present

## 2019-06-22 DIAGNOSIS — B351 Tinea unguium: Secondary | ICD-10-CM | POA: Diagnosis not present

## 2019-06-22 DIAGNOSIS — Z7189 Other specified counseling: Secondary | ICD-10-CM | POA: Diagnosis not present

## 2019-06-22 DIAGNOSIS — L608 Other nail disorders: Secondary | ICD-10-CM | POA: Diagnosis not present

## 2019-06-23 ENCOUNTER — Ambulatory Visit: Payer: No Typology Code available for payment source | Admitting: *Deleted

## 2019-06-30 DIAGNOSIS — Z930 Tracheostomy status: Secondary | ICD-10-CM | POA: Diagnosis not present

## 2019-06-30 DIAGNOSIS — Z430 Encounter for attention to tracheostomy: Secondary | ICD-10-CM | POA: Diagnosis not present

## 2019-07-02 ENCOUNTER — Other Ambulatory Visit: Payer: Self-pay | Admitting: Internal Medicine

## 2019-07-04 DIAGNOSIS — J324 Chronic pansinusitis: Secondary | ICD-10-CM | POA: Diagnosis not present

## 2019-07-06 DIAGNOSIS — Z930 Tracheostomy status: Secondary | ICD-10-CM | POA: Diagnosis not present

## 2019-07-06 DIAGNOSIS — R490 Dysphonia: Secondary | ICD-10-CM | POA: Diagnosis not present

## 2019-07-06 DIAGNOSIS — J3802 Paralysis of vocal cords and larynx, bilateral: Secondary | ICD-10-CM | POA: Diagnosis not present

## 2019-07-06 DIAGNOSIS — R0600 Dyspnea, unspecified: Secondary | ICD-10-CM | POA: Diagnosis not present

## 2019-07-06 DIAGNOSIS — D141 Benign neoplasm of larynx: Secondary | ICD-10-CM | POA: Diagnosis not present

## 2019-07-06 DIAGNOSIS — J386 Stenosis of larynx: Secondary | ICD-10-CM | POA: Diagnosis not present

## 2019-07-07 DIAGNOSIS — J449 Chronic obstructive pulmonary disease, unspecified: Secondary | ICD-10-CM | POA: Diagnosis not present

## 2019-07-07 DIAGNOSIS — J3800 Paralysis of vocal cords and larynx, unspecified: Secondary | ICD-10-CM | POA: Diagnosis not present

## 2019-07-07 DIAGNOSIS — R0982 Postnasal drip: Secondary | ICD-10-CM | POA: Diagnosis not present

## 2019-07-14 DIAGNOSIS — J4530 Mild persistent asthma, uncomplicated: Secondary | ICD-10-CM | POA: Diagnosis not present

## 2019-07-14 DIAGNOSIS — E785 Hyperlipidemia, unspecified: Secondary | ICD-10-CM | POA: Diagnosis not present

## 2019-07-14 DIAGNOSIS — I10 Essential (primary) hypertension: Secondary | ICD-10-CM | POA: Diagnosis not present

## 2019-07-14 DIAGNOSIS — R7309 Other abnormal glucose: Secondary | ICD-10-CM | POA: Diagnosis not present

## 2019-07-21 ENCOUNTER — Other Ambulatory Visit: Payer: Self-pay | Admitting: *Deleted

## 2019-07-25 NOTE — Patient Outreach (Signed)
[name]) Care Management  07/25/2019  [name] [name] 1951/07/10 [id number]   RN Health Coach attempted follow up outreach call to patient.  Patient was unavailable. HIPPA compliance voicemail message left with return callback number.  Plan: RN will call patient again within 30 days.  [place] Care Management [phone]

## 2019-07-26 ENCOUNTER — Other Ambulatory Visit: Payer: Self-pay | Admitting: *Deleted

## 2019-07-26 NOTE — Patient Outreach (Signed)
[facility]) Care Management  [place]  07/26/2019   [name] [name] 1950-09-17 [id number]  RN Health Coach telephone received return call from patient.  Hipaa compliance verified. Per patient he is doing good. Patient stated that he gets short of breath when he walks fast. He is suctioning thick white sputum from trach. Per patient he is using the saline bullets to help thin the secretions when suctioning. Patient appetite is good. Patient is using inhalers and using the nebulizer as needing. He is using a moisture collar to help thin secretions. Patient ordered a pulse ox but did not know what the numbers meant. RN went over the reading for good and bad saturation. Patient has agreed to follow up outreach calls.   Encounter Medications:  Outpatient Encounter Medications as of 07/26/2019  Medication Sig Note  . albuterol (PROVENTIL HFA;VENTOLIN HFA) 108 (90 Base) MCG/ACT inhaler Inhale 2 puffs into the lungs every 6 (six) hours as needed for wheezing or shortness of breath.   Marland Kitchen albuterol (PROVENTIL) (2.5 MG/3ML) 0.083% nebulizer solution Take 3-6 mLs (2.5-5 mg total) by nebulization every 4 (four) hours as needed for wheezing or shortness of breath.   . budesonide-formoterol (SYMBICORT) 160-4.5 MCG/ACT inhaler INHALE TWO PUFFS BY MOUTH FIRST THING IN THE MORNING AND THEN ANOTHER TWO PUFFS 12 HOURS LATER   . cetirizine (ZYRTEC) 10 MG tablet Take 10 mg by mouth daily.   . Ciclopirox 0.77 % gel Apply 1 application topically 2 (two) times daily. Apply to nails   . finasteride (PROSCAR) 5 MG tablet Take 5 mg by mouth daily.   . fluticasone (FLONASE) 50 MCG/ACT nasal spray Place 2 sprays into both nostrils daily.   Marland Kitchen guaiFENesin (MUCINEX) 600 MG 12 hr tablet Take 600 mg by mouth 2 (two) times daily.   . hydrochlorothiazide (HYDRODIURIL) 25 MG tablet Take 1 tablet (25 mg total) by mouth daily. 05/20/2019: Per patient, Blood pressure medications was D/c by physician 2 weeks ago   . losartan (COZAAR) 25 MG tablet Take 25 mg by mouth daily.   . Multiple Vitamin (MULTIVITAMIN) tablet Take 1 tablet by mouth daily.   . pantoprazole (PROTONIX) 40 MG tablet TAKE 1 TABLET(40 MG) BY MOUTH DAILY 30 TO 60 MINUTES BEFORE FIRST MEAL OF THE DAY (Patient taking differently: Take 40 mg by mouth daily before breakfast. )   . terazosin (HYTRIN) 5 MG capsule Take 5 mg by mouth at bedtime.    . traMADol (ULTRAM) 50 MG tablet Take 1-2 tablets (50-100 mg total) by mouth every 6 (six) hours as needed for moderate pain or severe pain. Post-operatively    No facility-administered encounter medications on file as of 07/26/2019.     Functional Status:  In your present state of health, do you have any difficulty performing the following activities: 07/26/2019 06/01/2019  Hearing? N N  Vision? N N  Difficulty concentrating or making decisions? N N  Walking or climbing stairs? Y Y  Comment shortness of breath when walk fast or upstairs gets short of breath on moderate exertion  Dressing or bathing? N N  Doing errands, shopping? N N  Preparing Food and eating ? N N  Using the Toilet? N N  In the past six months, have you accidently leaked urine? Y Y  Do you have problems with loss of bowel control? N N  Managing your Medications? N N  Managing your Finances? N N  Housekeeping or managing your Housekeeping? - N  Some recent data might  be hidden    Fall/Depression Screening: Fall Risk  07/26/2019 06/01/2019 03/02/2019  Falls in the past year? 0 0 0  Number falls in past yr: 0 - -  Injury with Fall? 0 0 -  Follow up Falls evaluation completed Falls evaluation completed -   PHQ 2/9 Scores 07/26/2019 06/01/2019 03/02/2019 10/05/2018  PHQ - 2 Score 0 0 0 0   THN CM Care Plan Problem One     Most Recent Value  Care Plan Problem One  Knowledge Deficit in Self Management of COPD  Role Documenting the Problem One  [place] for Problem One  Active  THN Long Term Goal   Patient  will not have any admissions for COPD exacerbation within the next 90 days  THN Long Term Goal Start Date  07/26/19  Interventions for Problem One Long Term Goal  Rn reiterates the zones of COPD but also takes in cosideration that patient does have a trach. RN discussed monitoring pulse ox , trach care and suctioning. RN will follow up with further discussion  THN CM Short Term Goal #3  Patient will follo wup with Health Care Maintenance within the next 30 days  THN CM Short Term Goal #3 Start Date  07/26/19  Interventions for Short Tern Goal #3  RN reiterated Health Maintenance. Patient needs to get eye exam. Per patient will go next week. Patient needs shingles shots. RN will follow up for compliance  THN CM Short Term Goal #4  Patient will verbalize ordering an O2 sat machine form [employer] catalog within the next 30 days  THN CM Short Term Goal #4 Met Date  07/26/19  [facility] CM Short Term Goal #5   Patient will verbalize receiving information on O2 sta machine and understanding what it means within the next 30 days  THN CM Short Term Goal #5 Start Date  07/26/19  Interventions for Short Term Goal #5  Patient received O2 sat machine but doesn't know what the numbers mean. RN discussed the Oximetry range for good and bad saturation. RN sent patient educational material on pulse oximetry. RN will follow up with further discussion      Assessment:  Patient is using inhalers Patient is using nebulizer Patient has a pulse oximetry Patient will benefit from [place] telephonic outreach for education and support for COPD self management.  Plan:  RN discussed what the oximetry readings mean RN sent a Clinical Key education sheet on pulse oximetry RN discussed trach care RN sent ensure coupons RN will follow up outreach within the month of February  [name] [name] [place] Care Management [phone]

## 2019-08-01 ENCOUNTER — Emergency Department (HOSPITAL_COMMUNITY)
Admission: EM | Admit: 2019-08-01 | Discharge: 2019-08-01 | Disposition: A | Discharge status: Home / Self Care | Payer: No Typology Code available for payment source | Attending: Emergency Medicine | Admitting: Emergency Medicine

## 2019-08-01 DIAGNOSIS — R0602 Shortness of breath: Secondary | ICD-10-CM | POA: Diagnosis present

## 2019-08-01 DIAGNOSIS — J449 Chronic obstructive pulmonary disease, unspecified: Secondary | ICD-10-CM | POA: Insufficient documentation

## 2019-08-01 DIAGNOSIS — J398 Other specified diseases of upper respiratory tract: Secondary | ICD-10-CM | POA: Insufficient documentation

## 2019-08-01 DIAGNOSIS — I10 Essential (primary) hypertension: Secondary | ICD-10-CM | POA: Diagnosis not present

## 2019-08-01 DIAGNOSIS — Z79899 Other long term (current) drug therapy: Secondary | ICD-10-CM | POA: Diagnosis not present

## 2019-08-01 DIAGNOSIS — K117 Disturbances of salivary secretion: Secondary | ICD-10-CM | POA: Diagnosis not present

## 2019-08-01 DIAGNOSIS — Z930 Tracheostomy status: Secondary | ICD-10-CM | POA: Insufficient documentation

## 2019-08-01 NOTE — Discharge Instructions (Signed)
Your main complaint today with the mucus secretions and plugging at your tracheostomy site which was resolved by respiratory therapy, we feel you are safe for discharge home.  We offered further work-up which you were not interested in at this time.  Please follow-up with your primary doctor for further management.  Patient may change or worsen, please return to nearest emergency department.

## 2019-08-01 NOTE — ED Notes (Signed)
Patient deep suctioned by RT. Patient has no complaints. Reports that was all he was here for. Vitals stable.

## 2019-08-01 NOTE — ED Triage Notes (Signed)
Pt to ER for evaluation of shortness of breath related to trach needing to be suctioned. Sats 96% at this time.
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
## 2019-08-03 ENCOUNTER — Encounter (HOSPITAL_COMMUNITY): Payer: Self-pay | Admitting: Emergency Medicine

## 2019-08-03 ENCOUNTER — Other Ambulatory Visit: Payer: Self-pay

## 2019-08-03 ENCOUNTER — Emergency Department (HOSPITAL_COMMUNITY)
Admission: EM | Admit: 2019-08-03 | Discharge: 2019-08-03 | Disposition: A | Discharge status: Home / Self Care | Payer: No Typology Code available for payment source | Attending: Emergency Medicine | Admitting: Emergency Medicine

## 2019-08-03 DIAGNOSIS — Z79899 Other long term (current) drug therapy: Secondary | ICD-10-CM | POA: Diagnosis not present

## 2019-08-03 DIAGNOSIS — Z930 Tracheostomy status: Secondary | ICD-10-CM | POA: Insufficient documentation

## 2019-08-03 DIAGNOSIS — Z430 Encounter for attention to tracheostomy: Secondary | ICD-10-CM

## 2019-08-03 DIAGNOSIS — J449 Chronic obstructive pulmonary disease, unspecified: Secondary | ICD-10-CM | POA: Insufficient documentation

## 2019-08-03 DIAGNOSIS — I10 Essential (primary) hypertension: Secondary | ICD-10-CM | POA: Diagnosis not present

## 2019-08-03 DIAGNOSIS — Z743 Need for continuous supervision: Secondary | ICD-10-CM | POA: Diagnosis not present

## 2019-08-03 DIAGNOSIS — R062 Wheezing: Secondary | ICD-10-CM | POA: Diagnosis not present

## 2019-08-03 DIAGNOSIS — R5381 Other malaise: Secondary | ICD-10-CM | POA: Diagnosis not present

## 2019-08-03 NOTE — ED Notes (Signed)
Respiratory paged to assess trach.
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
## 2019-08-03 NOTE — ED Triage Notes (Signed)
Patient coming from home with complaints of discomfort related to his trach. Patient states that he feels as if it is clogged and he cannot relieve it at home. SpO2 97% on RA.

## 2019-08-04 DIAGNOSIS — Z930 Tracheostomy status: Secondary | ICD-10-CM | POA: Diagnosis not present

## 2019-08-04 DIAGNOSIS — Z430 Encounter for attention to tracheostomy: Secondary | ICD-10-CM | POA: Diagnosis not present

## 2019-08-04 DIAGNOSIS — J324 Chronic pansinusitis: Secondary | ICD-10-CM | POA: Diagnosis not present

## 2019-08-07 DIAGNOSIS — R0982 Postnasal drip: Secondary | ICD-10-CM | POA: Diagnosis not present

## 2019-08-07 DIAGNOSIS — J449 Chronic obstructive pulmonary disease, unspecified: Secondary | ICD-10-CM | POA: Diagnosis not present

## 2019-08-07 DIAGNOSIS — J3800 Paralysis of vocal cords and larynx, unspecified: Secondary | ICD-10-CM | POA: Diagnosis not present

## 2019-08-14 DIAGNOSIS — Z430 Encounter for attention to tracheostomy: Secondary | ICD-10-CM | POA: Diagnosis not present

## 2019-08-14 DIAGNOSIS — J3802 Paralysis of vocal cords and larynx, bilateral: Secondary | ICD-10-CM | POA: Diagnosis not present

## 2019-08-14 DIAGNOSIS — Z930 Tracheostomy status: Secondary | ICD-10-CM | POA: Diagnosis not present

## 2019-08-23 DIAGNOSIS — Z430 Encounter for attention to tracheostomy: Secondary | ICD-10-CM | POA: Diagnosis not present

## 2019-08-23 DIAGNOSIS — J3802 Paralysis of vocal cords and larynx, bilateral: Secondary | ICD-10-CM | POA: Diagnosis not present

## 2019-08-24 ENCOUNTER — Ambulatory Visit: Payer: No Typology Code available for payment source | Admitting: *Deleted

## 2019-09-06 DIAGNOSIS — J3800 Paralysis of vocal cords and larynx, unspecified: Secondary | ICD-10-CM | POA: Diagnosis not present

## 2019-09-06 DIAGNOSIS — R0982 Postnasal drip: Secondary | ICD-10-CM | POA: Diagnosis not present

## 2019-09-06 DIAGNOSIS — J449 Chronic obstructive pulmonary disease, unspecified: Secondary | ICD-10-CM | POA: Diagnosis not present

## 2019-09-10 ENCOUNTER — Emergency Department (HOSPITAL_COMMUNITY)
Admission: EM | Admit: 2019-09-10 | Discharge: 2019-09-10 | Disposition: A | Discharge status: Home / Self Care | Payer: No Typology Code available for payment source | Attending: Emergency Medicine | Admitting: Emergency Medicine

## 2019-09-10 ENCOUNTER — Emergency Department (HOSPITAL_COMMUNITY): Payer: No Typology Code available for payment source

## 2019-09-10 ENCOUNTER — Encounter (HOSPITAL_COMMUNITY): Payer: Self-pay | Admitting: Obstetrics and Gynecology

## 2019-09-10 ENCOUNTER — Other Ambulatory Visit: Payer: Self-pay

## 2019-09-10 DIAGNOSIS — Z430 Encounter for attention to tracheostomy: Secondary | ICD-10-CM | POA: Diagnosis not present

## 2019-09-10 DIAGNOSIS — Z79899 Other long term (current) drug therapy: Secondary | ICD-10-CM | POA: Diagnosis not present

## 2019-09-10 DIAGNOSIS — J441 Chronic obstructive pulmonary disease with (acute) exacerbation: Secondary | ICD-10-CM | POA: Insufficient documentation

## 2019-09-10 DIAGNOSIS — U071 COVID-19: Secondary | ICD-10-CM | POA: Diagnosis not present

## 2019-09-10 DIAGNOSIS — J189 Pneumonia, unspecified organism: Secondary | ICD-10-CM | POA: Diagnosis not present

## 2019-09-10 DIAGNOSIS — R0602 Shortness of breath: Secondary | ICD-10-CM | POA: Diagnosis present

## 2019-09-10 DIAGNOSIS — I10 Essential (primary) hypertension: Secondary | ICD-10-CM | POA: Insufficient documentation

## 2019-09-10 LAB — CBC WITH DIFFERENTIAL/PLATELET
Abs Immature Granulocytes: 0.01 10*3/uL (ref 0.00–0.07)
Basophils Absolute: 0.1 10*3/uL (ref 0.0–0.1)
Basophils Relative: 1 %
Eosinophils Absolute: 0.4 10*3/uL (ref 0.0–0.5)
Eosinophils Relative: 5 %
HCT: 42 % (ref 39.0–52.0)
Hemoglobin: 13.6 g/dL (ref 13.0–17.0)
Immature Granulocytes: 0 %
Lymphocytes Relative: 7 %
Lymphs Abs: 0.6 10*3/uL — ABNORMAL LOW (ref 0.7–4.0)
MCH: 29.6 pg (ref 26.0–34.0)
MCHC: 32.4 g/dL (ref 30.0–36.0)
MCV: 91.3 fL (ref 80.0–100.0)
Monocytes Absolute: 0.3 10*3/uL (ref 0.1–1.0)
Monocytes Relative: 4 %
Neutro Abs: 6.6 10*3/uL (ref 1.7–7.7)
Neutrophils Relative %: 83 %
Platelets: 289 10*3/uL (ref 150–400)
RBC: 4.6 MIL/uL (ref 4.22–5.81)
RDW: 14.8 % (ref 11.5–15.5)
WBC: 8 10*3/uL (ref 4.0–10.5)
nRBC: 0 % (ref 0.0–0.2)

## 2019-09-10 LAB — BASIC METABOLIC PANEL
Anion gap: 11 (ref 5–15)
BUN: 15 mg/dL (ref 8–23)
CO2: 21 mmol/L — ABNORMAL LOW (ref 22–32)
Calcium: 8.8 mg/dL — ABNORMAL LOW (ref 8.9–10.3)
Chloride: 102 mmol/L (ref 98–111)
Creatinine, Ser: 0.74 mg/dL (ref 0.61–1.24)
GFR calc Af Amer: >60 mL/min (ref >60)
GFR calc non Af Amer: >60 mL/min (ref >60)
Glucose, Bld: 119 mg/dL — ABNORMAL HIGH (ref 70–99)
Potassium: 3.6 mmol/L (ref 3.5–5.1)
Sodium: 134 mmol/L — ABNORMAL LOW (ref 135–145)

## 2019-09-10 LAB — POC SARS CORONAVIRUS 2 AG -  ED: SARS Coronavirus 2 Ag: NEGATIVE

## 2019-09-10 LAB — SARS CORONAVIRUS 2 (TAT 6-24 HRS): SARS Coronavirus 2: POSITIVE — AB

## 2019-09-10 MED ORDER — PREDNISONE 50 MG PO TABS: 50.0000 mg | ORAL_TABLET | Freq: Every day | ORAL | 0 refills | Status: DC

## 2019-09-10 MED ORDER — DOXYCYCLINE HYCLATE 100 MG PO TABS: 100.0000 mg | ORAL_TABLET | Freq: Two times a day (BID) | ORAL | 0 refills | Status: DC

## 2019-09-10 MED ORDER — SODIUM CHLORIDE 0.9 % IV SOLN
1.0000 g | Freq: Once | INTRAVENOUS | Status: AC
  Administered 2019-09-10: 15:00:00 1 g via INTRAVENOUS
  Filled 2019-09-10: qty 10

## 2019-09-10 MED ORDER — AZITHROMYCIN 250 MG PO TABS
500.0000 mg | ORAL_TABLET | Freq: Once | ORAL | Status: AC
  Administered 2019-09-10: 500 mg via ORAL
  Filled 2019-09-10: qty 2
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
## 2019-09-10 NOTE — Discharge Instructions (Addendum)
Take the antibiotics as prescribed.  Follow-up with your doctor to be rechecked next week.  Return to the ER for worsening symptoms, difficulty breathing, shortness of breath.  Your rapid Covid test was negative today but we will send off a confirmatory test that will result within 24 hours.  You can check your results on MyChart

## 2019-09-10 NOTE — ED Triage Notes (Signed)
Per EMS: Patient is coming from home with complaint of Sob. Patient tried x4 nebulizers. Patient has a trach but it is non-vented and was 82% on RA after nebulizers.  Patient was given a duoneb, 125mg  of solumedrol, and 2mg  of magnesium en route by EMS.  Patient has wheezing bilaterally and ronchi. Patient is on blow by at 10L.  Pt has a hx of COPD. Patient denies COVID exposures.

## 2019-09-10 NOTE — Progress Notes (Signed)
Rt called to room WA06 for trach assessment. Pt has a #6CFS. Per pt has had since 2017. Rt placed 28% TC on pt. Pt sats 96% vitals normal. No distress noted pt does have wheezing.Pt received nebs by EMS.

## 2019-09-11 ENCOUNTER — Other Ambulatory Visit: Payer: Self-pay | Admitting: Nurse Practitioner

## 2019-09-11 DIAGNOSIS — I10 Essential (primary) hypertension: Secondary | ICD-10-CM

## 2019-09-11 DIAGNOSIS — U071 COVID-19: Secondary | ICD-10-CM
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
## 2019-09-13 ENCOUNTER — Ambulatory Visit (HOSPITAL_COMMUNITY)
Admission: RE | Admit: 2019-09-13 | Discharge: 2019-09-13 | Disposition: A | Discharge status: Home / Self Care | Payer: Medicare Other | Source: Ambulatory Visit | Attending: Pulmonary Disease | Admitting: Pulmonary Disease

## 2019-09-13 DIAGNOSIS — I10 Essential (primary) hypertension: Secondary | ICD-10-CM | POA: Diagnosis not present

## 2019-09-13 DIAGNOSIS — U071 COVID-19: Secondary | ICD-10-CM | POA: Diagnosis not present

## 2019-09-13 DIAGNOSIS — Z23 Encounter for immunization: Secondary | ICD-10-CM | POA: Insufficient documentation

## 2019-09-13 MED ORDER — METHYLPREDNISOLONE SODIUM SUCC 125 MG IJ SOLR: 125.0000 mg | Freq: Once | INTRAMUSCULAR | Status: DC | PRN

## 2019-09-13 MED ORDER — FAMOTIDINE IN NACL 20-0.9 MG/50ML-% IV SOLN: 20.0000 mg | Freq: Once | INTRAVENOUS | Status: DC | PRN

## 2019-09-13 MED ORDER — ALBUTEROL SULFATE HFA 108 (90 BASE) MCG/ACT IN AERS: 2.0000 | INHALATION_SPRAY | Freq: Once | RESPIRATORY_TRACT | Status: DC | PRN

## 2019-09-13 MED ORDER — SODIUM CHLORIDE 0.9 % IV SOLN
700.0000 mg | Freq: Once | INTRAVENOUS | Status: AC
  Administered 2019-09-13: 11:00:00 700 mg via INTRAVENOUS
  Filled 2019-09-13: qty 20

## 2019-09-13 MED ORDER — DIPHENHYDRAMINE HCL 50 MG/ML IJ SOLN: 50.0000 mg | Freq: Once | INTRAMUSCULAR | Status: DC | PRN

## 2019-09-13 MED ORDER — SODIUM CHLORIDE 0.9 % IV SOLN
INTRAVENOUS | Status: DC | PRN
  Administered 2019-09-13: 250 mL via INTRAVENOUS

## 2019-09-13 MED ORDER — EPINEPHRINE 0.3 MG/0.3ML IJ SOAJ: 0.3000 mg | Freq: Once | INTRAMUSCULAR | Status: DC | PRN

## 2019-09-13 NOTE — Progress Notes (Signed)
  Diagnosis: COVID-19  Physician: Caren Macadam, MD  Procedure: Covid Infusion Clinic Med: bamlanivimab infusion - Provided patient with bamlanimivab fact sheet for patients, parents and caregivers prior to infusion.  Complications: No immediate complications noted.  Discharge: Discharged home   [place] 09/13/2019

## 2019-09-13 NOTE — Discharge Instructions (Signed)
10 Things You Can Do to Manage Your COVID-19 Symptoms at Home If you have possible or confirmed COVID-19: 1. Stay home from work and school. And stay away from other public places. If you must go out, avoid using any kind of public transportation, ridesharing, or taxis. 2. Monitor your symptoms carefully. If your symptoms get worse, call your healthcare provider immediately. 3. Get rest and stay hydrated. 4. If you have a medical appointment, call the healthcare provider ahead of time and tell them that you have or may have COVID-19. 5. For medical emergencies, call 911 and notify the dispatch personnel that you have or may have COVID-19. 6. Cover your cough and sneezes with a tissue or use the inside of your elbow. 7. Wash your hands often with soap and water for at least 20 seconds or clean your hands with an alcohol-based hand sanitizer that contains at least 60% alcohol. 8. As much as possible, stay in a specific room and away from other people in your home. Also, you should use a separate bathroom, if available. If you need to be around other people in or outside of the home, wear a mask. 9. Avoid sharing personal items with other people in your household, like dishes, towels, and bedding. 10. Clean all surfaces that are touched often, like counters, tabletops, and doorknobs. Use household cleaning sprays or wipes according to the label instructions. [email or web address].[email or web address] 03/15/2019 This information is not intended to replace advice given to you by your health care provider. Make sure you discuss any questions you have with your health care provider. Document Revised: 08/17/2019 Document Reviewed: 08/17/2019 Elsevier Patient Education  [place].   COVID-19 Frequently Asked Questions COVID-19 (coronavirus disease) is an infection that is caused by a large family of viruses. Some viruses cause illness in people and others cause illness in animals like camels, cats, and bats. In some  cases, the viruses that cause illness in animals can spread to humans. Where did the coronavirus come from? In December 2019, [place] told the [facility]) of several cases of lung disease (human respiratory illness). These cases were linked to an open seafood and livestock market in the city of [place]. The link to the seafood and livestock market suggests that the virus may have spread from animals to humans. However, since that first outbreak in December, the virus has also been shown to spread from person to person. What is the name of the disease and the virus? Disease name Early on, this disease was called novel coronavirus. This is because scientists determined that the disease was caused by a new (novel) respiratory virus. The World Health Organization [facility]) has now named the disease COVID-19, or coronavirus disease. Virus name The virus that causes the disease is called severe acute respiratory syndrome coronavirus 2 (SARS-CoV-2). More information on disease and virus naming World Health Organization [facility]): www.who.int/emergencies/diseases/novel-coronavirus-2019/technical-guidance/naming-the-coronavirus-disease-(covid-2019)-and-the-virus-that-causes-it Who is at risk for complications from coronavirus disease? Some people may be at higher risk for complications from coronavirus disease. This includes older adults and people who have chronic diseases, such as heart disease, diabetes, and lung disease. If you are at higher risk for complications, take these extra precautions:  Avoid close contact with people who are sick or have a fever or cough. Stay at least 3-6 ft (1-2 m) away from them, if possible.  Wash your hands often with soap and water for at least 20 seconds.  Avoid touching your face, mouth, nose, or eyes.  Keep supplies on  hand at home, such as food, medicine, and cleaning supplies.  Stay home as much as possible.  Avoid social gatherings and travel. How  does coronavirus disease spread? The virus that causes coronavirus disease spreads easily from person to person (is contagious). There are also cases of community-spread disease. This means the disease has spread to:  People who have no known contact with other infected people.  People who have not traveled to areas where there are known cases. It appears to spread from one person to another through droplets from coughing or sneezing. Can I get the virus from touching surfaces or objects? There is still a lot that we do not know about the virus that causes coronavirus disease. Scientists are basing a lot of information on what they know about similar viruses, such as:  Viruses cannot generally survive on surfaces for long. They need a human body (host) to survive.  It is more likely that the virus is spread by close contact with people who are sick (direct contact), such as through: ? Shaking hands or hugging. ? Breathing in respiratory droplets that travel through the air. This can happen when an infected person coughs or sneezes on or near other people.  It is less likely that the virus is spread when a person touches a surface or object that has the virus on it (indirect contact). The virus may be able to enter the body if the person touches a surface or object and then touches his or her face, eyes, nose, or mouth. Can a person spread the virus without having symptoms of the disease? It may be possible for the virus to spread before a person has symptoms of the disease, but this is most likely not the main way the virus is spreading. It is more likely for the virus to spread by being in close contact with people who are sick and breathing in the respiratory droplets of a sick person's cough or sneeze. What are the symptoms of coronavirus disease? Symptoms vary from person to person and can range from mild to severe. Symptoms may include:  Fever.  Cough.  Tiredness, weakness, or  fatigue.  Fast breathing or feeling short of breath. These symptoms can appear anywhere from 2 to 14 days after you have been exposed to the virus. If you develop symptoms, call your health care provider. People with severe symptoms may need hospital care. If I am exposed to the virus, how long does it take before symptoms start? Symptoms of coronavirus disease may appear anywhere from 2 to 14 days after a person has been exposed to the virus. If you develop symptoms, call your health care provider. Should I be tested for this virus? Your health care provider will decide whether to test you based on your symptoms, history of exposure, and your risk factors. How does a health care provider test for this virus? Health care providers will collect samples to send for testing. Samples may include:  Taking a swab of fluid from the nose.  Taking fluid from the lungs by having you cough up mucus (sputum) into a sterile cup.  Taking a blood sample.  Taking a stool or urine sample. Is there a treatment or vaccine for this virus? Currently, there is no vaccine to prevent coronavirus disease. Also, there are no medicines like antibiotics or antivirals to treat the virus. A person who becomes sick is given supportive care, which means rest and fluids. A person may also relieve his or her  symptoms by using over-the-counter medicines that treat sneezing, coughing, and runny nose. These are the same medicines that a person takes for the common cold. If you develop symptoms, call your health care provider. People with severe symptoms may need hospital care. What can I do to protect myself and my family from this virus?     You can protect yourself and your family by taking the same actions that you would take to prevent the spread of other viruses. Take the following actions:  Wash your hands often with soap and water for at least 20 seconds. If soap and water are not available, use alcohol-based hand  sanitizer.  Avoid touching your face, mouth, nose, or eyes.  Cough or sneeze into a tissue, sleeve, or elbow. Do not cough or sneeze into your hand or the air. ? If you cough or sneeze into a tissue, throw it away immediately and wash your hands.  Disinfect objects and surfaces that you frequently touch every day.  Avoid close contact with people who are sick or have a fever or cough. Stay at least 3-6 ft (1-2 m) away from them, if possible.  Stay home if you are sick, except to get medical care. Call your health care provider before you get medical care.  Make sure your vaccines are up to date. Ask your health care provider what vaccines you need. What should I do if I need to travel? Follow travel recommendations from your local health authority, the CDC, and WHO. Travel information and advice  Centers for Disease Control and Prevention (CDC): BodyEditor.hu  World Health Organization [facility]): ThirdIncome.ca Know the risks and take action to protect your health  You are at higher risk of getting coronavirus disease if you are traveling to areas with an outbreak or if you are exposed to travelers from areas with an outbreak.  Wash your hands often and practice good hygiene to lower the risk of catching or spreading the virus. What should I do if I am sick? General instructions to stop the spread of infection  Wash your hands often with soap and water for at least 20 seconds. If soap and water are not available, use alcohol-based hand sanitizer.  Cough or sneeze into a tissue, sleeve, or elbow. Do not cough or sneeze into your hand or the air.  If you cough or sneeze into a tissue, throw it away immediately and wash your hands.  Stay home unless you must get medical care. Call your health care provider or local health authority before you get medical care.  Avoid public areas. Do not take  public transportation, if possible.  If you can, wear a mask if you must go out of the house or if you are in close contact with someone who is not sick. Keep your home clean  Disinfect objects and surfaces that are frequently touched every day. This may include: ? Counters and tables. ? Doorknobs and light switches. ? Sinks and faucets. ? Electronics such as phones, remote controls, keyboards, computers, and tablets.  Wash dishes in hot, soapy water or use a dishwasher. Air-dry your dishes.  Wash laundry in hot water. Prevent infecting other household members  Let healthy household members care for children and pets, if possible. If you have to care for children or pets, wash your hands often and wear a mask.  Sleep in a different bedroom or bed, if possible.  Do not share personal items, such as razors, toothbrushes, deodorant, combs, brushes, towels, and  washcloths. Where to find more information Centers for Disease Control and Prevention (CDC)  Information and news updates: https://www.butler-gonzalez.com/ World Health Organization [facility])  Information and news updates: MissExecutive.com.ee  Coronavirus health topic: https://www.castaneda.info/  Questions and answers on COVID-19: OpportunityDebt.at  Global tracker: who.sprinklr.com American Academy of Pediatrics (AAP)  Information for families: www.healthychildren.org/English/health-issues/conditions/chest-lungs/Pages/2019-Novel-Coronavirus.aspx The coronavirus situation is changing rapidly. Check your local health authority website or the CDC and [facility] websites for updates and news. When should I contact a health care provider?  Contact your health care provider if you have symptoms of an infection, such as fever or cough, and you: ? Have been near anyone who is known to have coronavirus disease. ? Have come into contact with a person who is  suspected to have coronavirus disease. ? Have traveled outside of the country. When should I get emergency medical care?  Get help right away by calling your local emergency services (911 in the [place] if you have: ? Trouble breathing. ? Pain or pressure in your chest. ? Confusion. ? Blue-tinged lips and fingernails. ? Difficulty waking from sleep. ? Symptoms that get worse. Let the emergency medical personnel know if you think you have coronavirus disease. Summary  A new respiratory virus is spreading from person to person and causing COVID-19 (coronavirus disease).  The virus that causes COVID-19 appears to spread easily. It spreads from one person to another through droplets from coughing or sneezing.  Older adults and those with chronic diseases are at higher risk of disease. If you are at higher risk for complications, take extra precautions.  There is currently no vaccine to prevent coronavirus disease. There are no medicines, such as antibiotics or antivirals, to treat the virus.  You can protect yourself and your family by washing your hands often, avoiding touching your face, and covering your coughs and sneezes. This information is not intended to replace advice given to you by your health care provider. Make sure you discuss any questions you have with your health care provider. Document Released: 12/27/2018 Document Revised: 12/27/2018 Document Reviewed: 12/27/2018 Elsevier Patient Education  [place].

## 2019-09-25 ENCOUNTER — Ambulatory Visit: Payer: No Typology Code available for payment source | Admitting: *Deleted

## 2019-09-25 DIAGNOSIS — J3802 Paralysis of vocal cords and larynx, bilateral: Secondary | ICD-10-CM | POA: Diagnosis not present

## 2019-09-25 DIAGNOSIS — Z930 Tracheostomy status: Secondary | ICD-10-CM | POA: Diagnosis not present

## 2019-09-26 DIAGNOSIS — Z930 Tracheostomy status: Secondary | ICD-10-CM | POA: Diagnosis not present

## 2019-09-27 DIAGNOSIS — Z430 Encounter for attention to tracheostomy: Secondary | ICD-10-CM | POA: Diagnosis not present

## 2019-09-27 DIAGNOSIS — Z930 Tracheostomy status: Secondary | ICD-10-CM | POA: Diagnosis not present

## 2019-09-28 DIAGNOSIS — E785 Hyperlipidemia, unspecified: Secondary | ICD-10-CM | POA: Diagnosis not present

## 2019-09-28 DIAGNOSIS — I10 Essential (primary) hypertension: Secondary | ICD-10-CM | POA: Diagnosis not present

## 2019-09-28 DIAGNOSIS — J4530 Mild persistent asthma, uncomplicated: Secondary | ICD-10-CM | POA: Diagnosis not present

## 2019-09-28 DIAGNOSIS — Z0000 Encounter for general adult medical examination without abnormal findings: Secondary | ICD-10-CM | POA: Diagnosis not present

## 2019-09-28 DIAGNOSIS — R7303 Prediabetes: Secondary | ICD-10-CM | POA: Diagnosis not present

## 2019-09-28 IMAGING — DX DG CHEST 2V
2 series · 2 of 2 positions shown · non-contrast
Comparison: 08/02/2017

CLINICAL DATA: Chronic cough, congestion, dyspnea

EXAM:
CHEST - 2 VIEW

[chest pa]
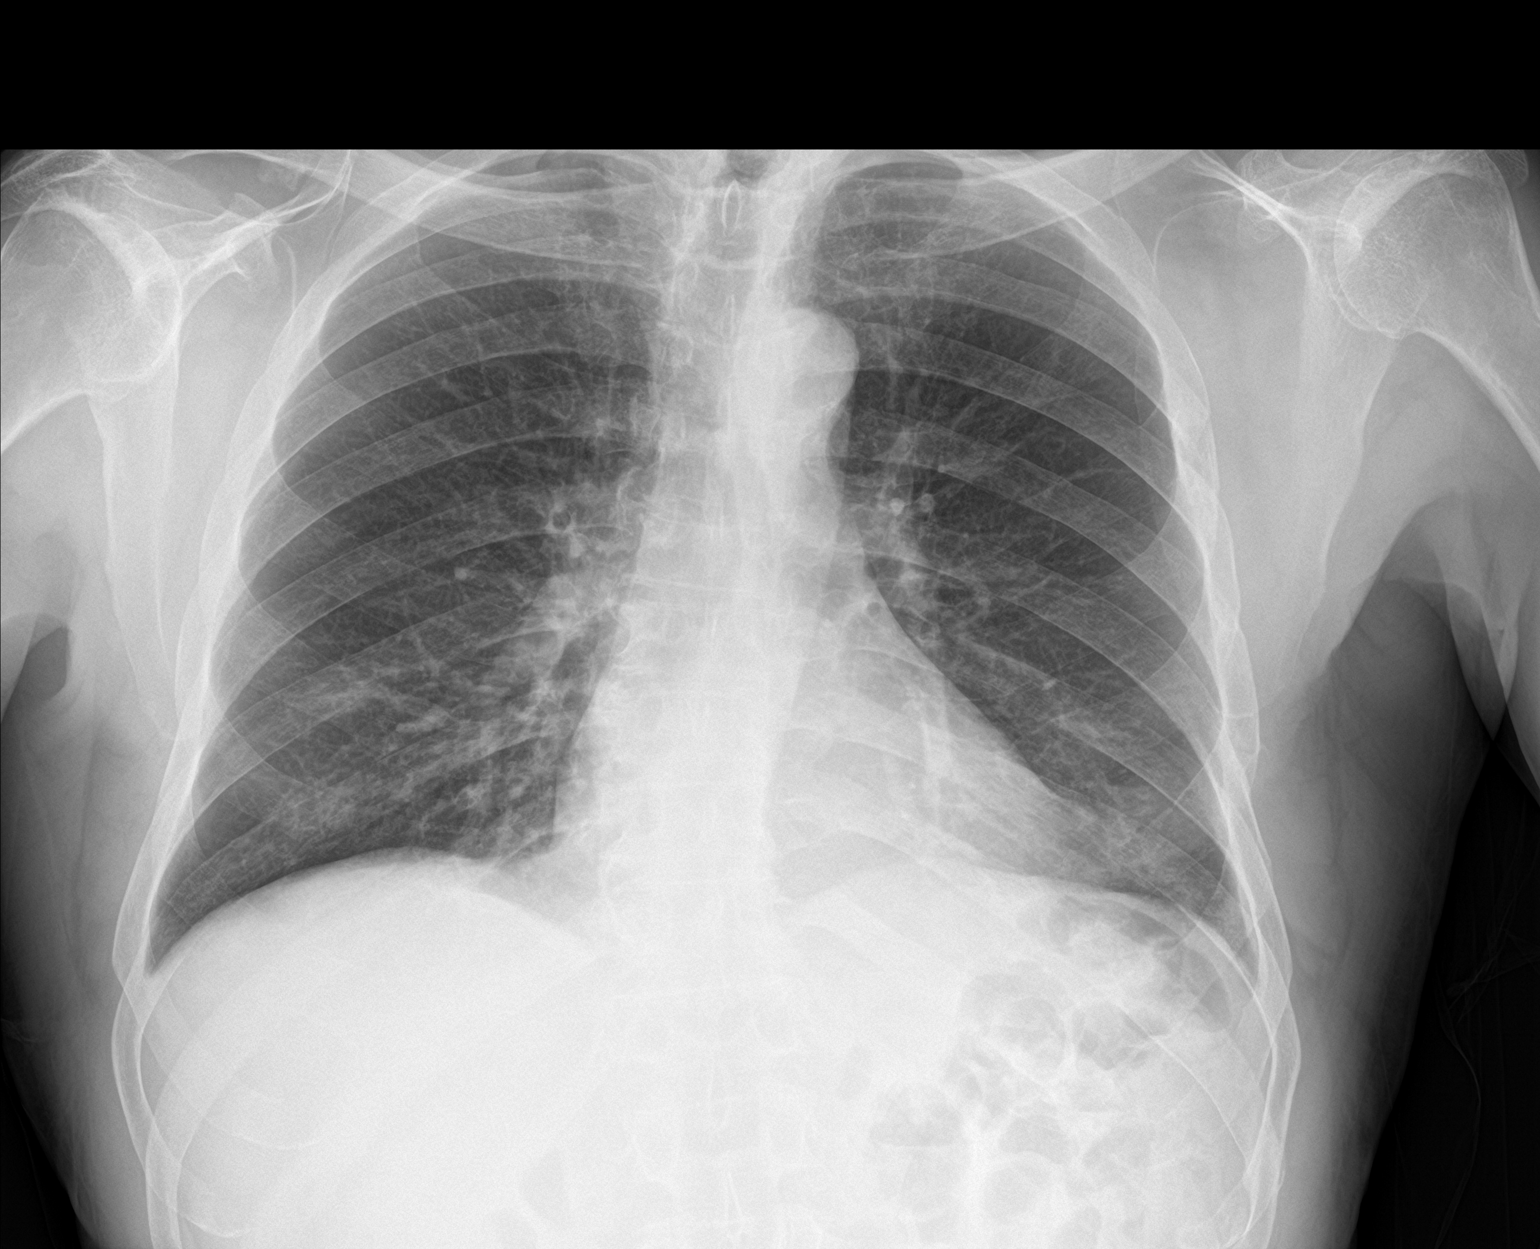

[chest lat]
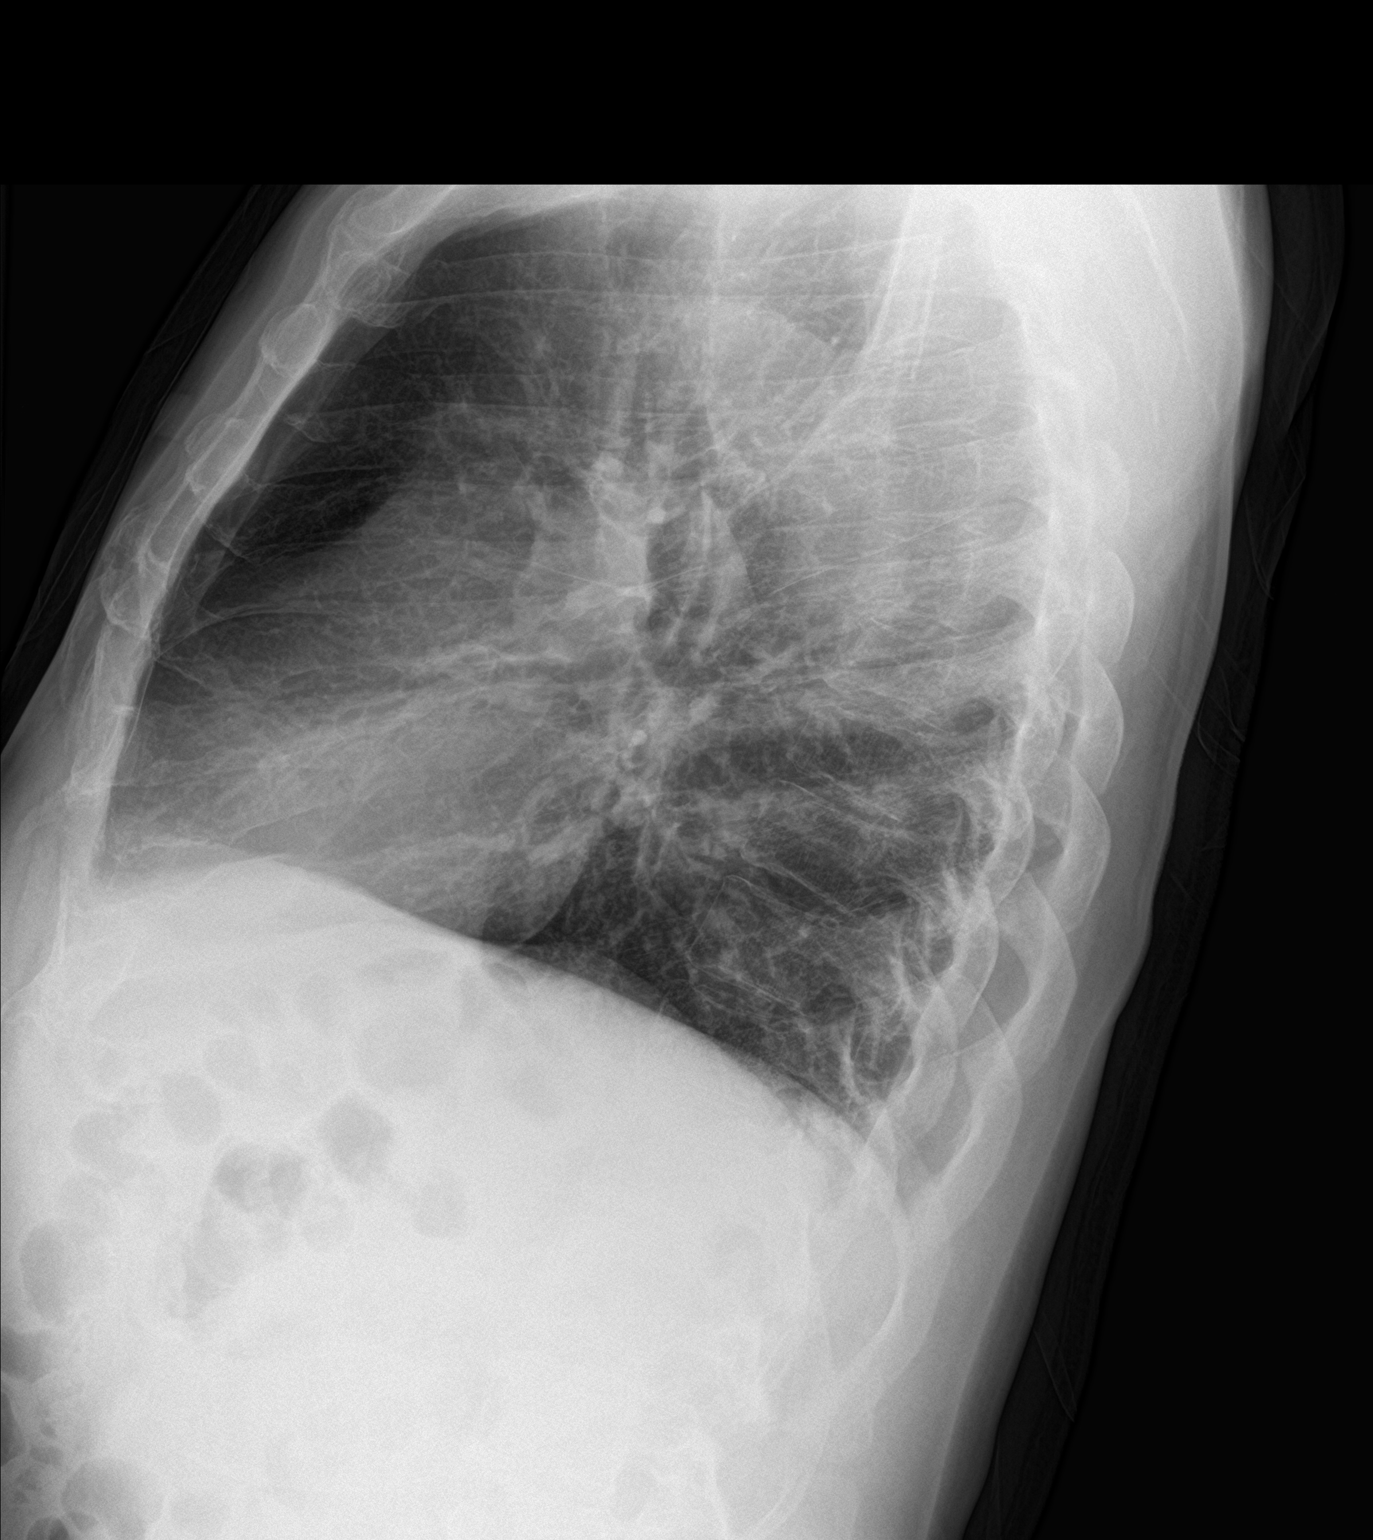

[2 of 2 positions shown; findings below may reference images not displayed]

FINDINGS: There is no focal consolidation. There is lingular scarring. There
is mild bilateral chronic interstitial thickening. There is no
pleural effusion or pneumothorax. The heart and mediastinal contours
are unremarkable.

The osseous structures are unremarkable.
IMPRESSION: No active cardiopulmonary disease.

## 2019-10-12 DIAGNOSIS — J3802 Paralysis of vocal cords and larynx, bilateral: Secondary | ICD-10-CM | POA: Diagnosis not present

## 2019-10-12 DIAGNOSIS — Z930 Tracheostomy status: Secondary | ICD-10-CM | POA: Diagnosis not present

## 2019-10-12 DIAGNOSIS — Z430 Encounter for attention to tracheostomy: Secondary | ICD-10-CM | POA: Diagnosis not present

## 2019-10-12 DIAGNOSIS — J386 Stenosis of larynx: Secondary | ICD-10-CM | POA: Diagnosis not present

## 2019-10-12 DIAGNOSIS — R0600 Dyspnea, unspecified: Secondary | ICD-10-CM | POA: Diagnosis not present

## 2019-10-12 DIAGNOSIS — D141 Benign neoplasm of larynx: Secondary | ICD-10-CM | POA: Diagnosis not present

## 2019-10-12 DIAGNOSIS — R490 Dysphonia: Secondary | ICD-10-CM | POA: Diagnosis not present

## 2019-10-25 IMAGING — DX DG CHEST 2V
2 series · 2 of 2 positions shown · non-contrast
Comparison: Prior radiograph from 12/14/2017.

CLINICAL DATA: Initial evaluation for acute shortness of breath,
URI symptoms. History of COPD.

EXAM:
CHEST - 2 VIEW

[chest pa]
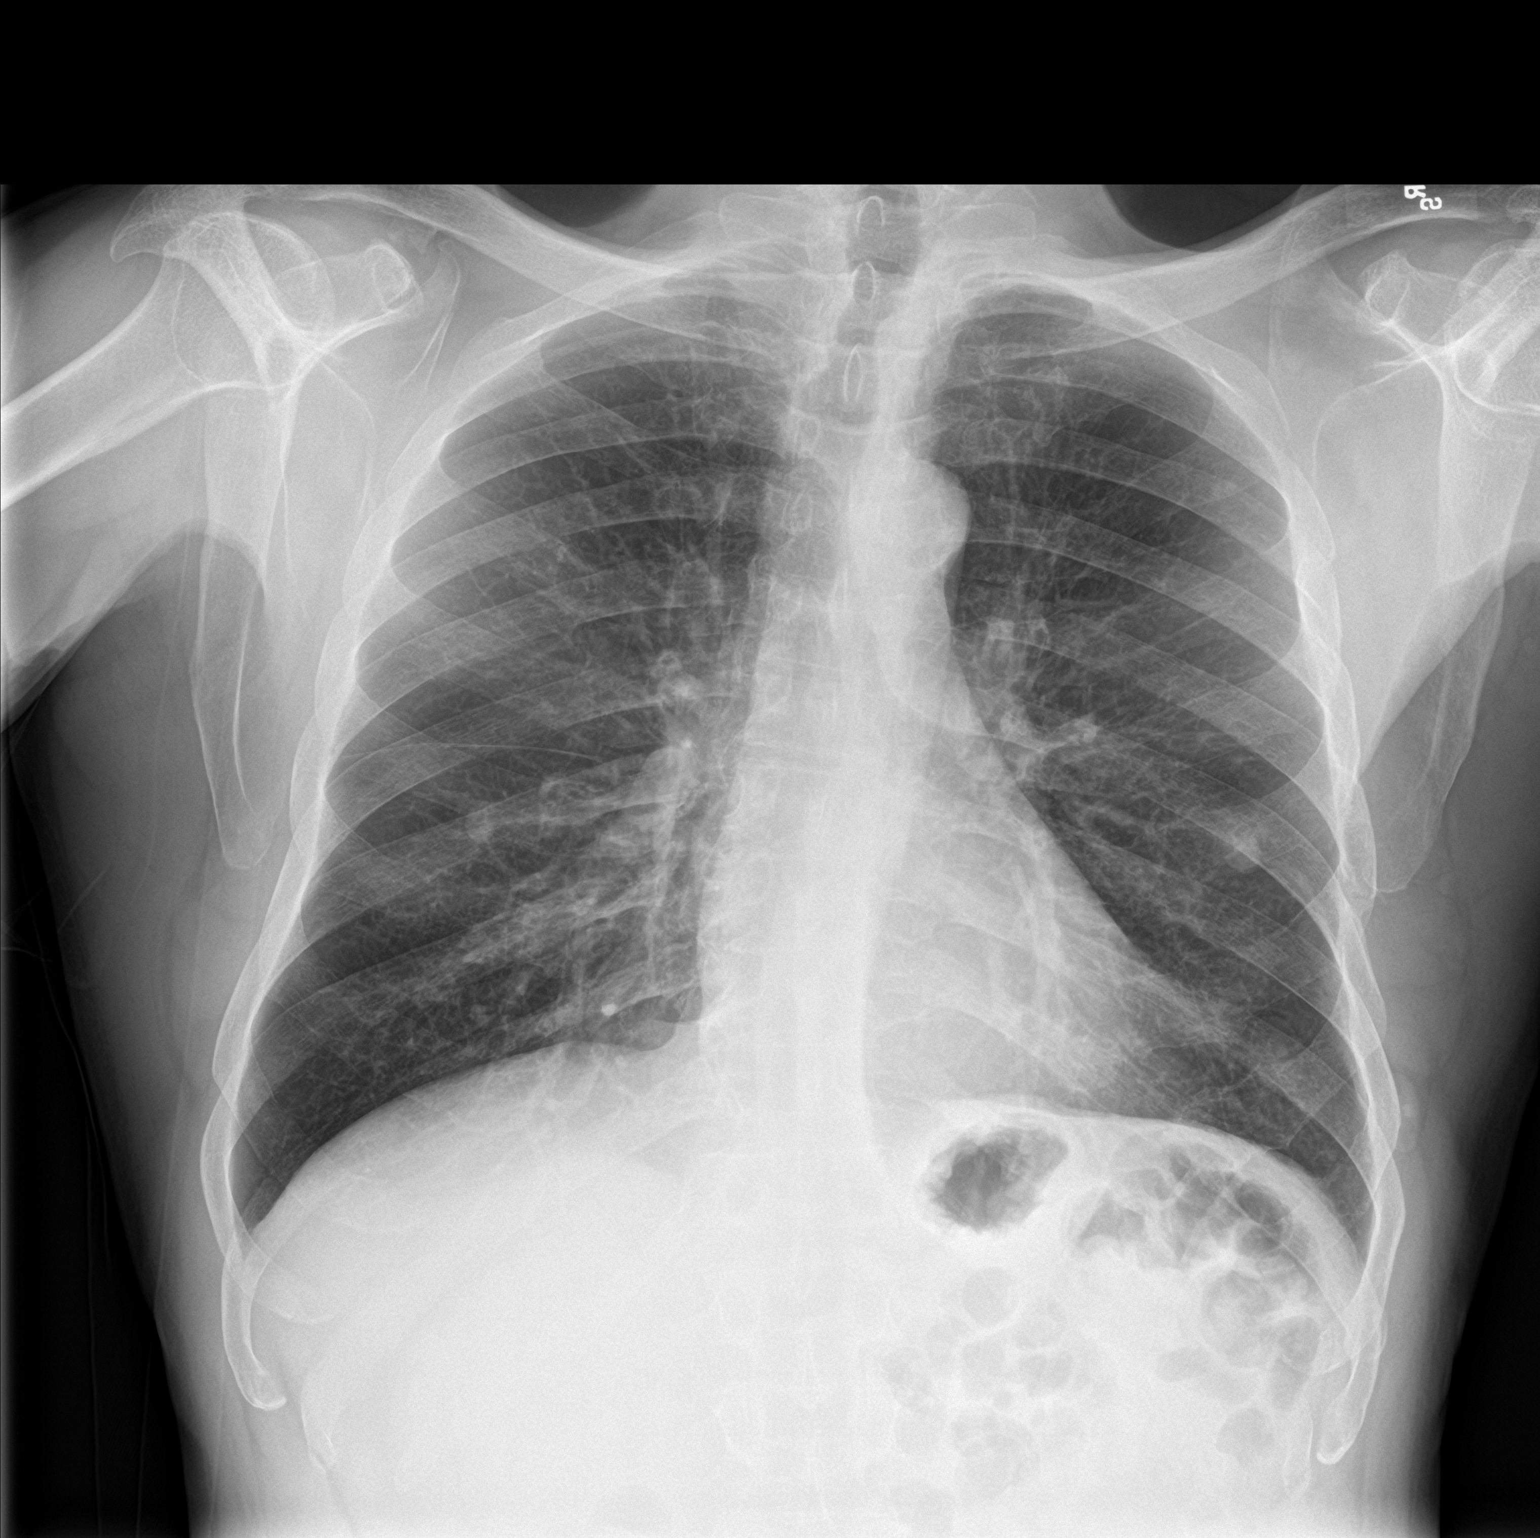

[chest lat]
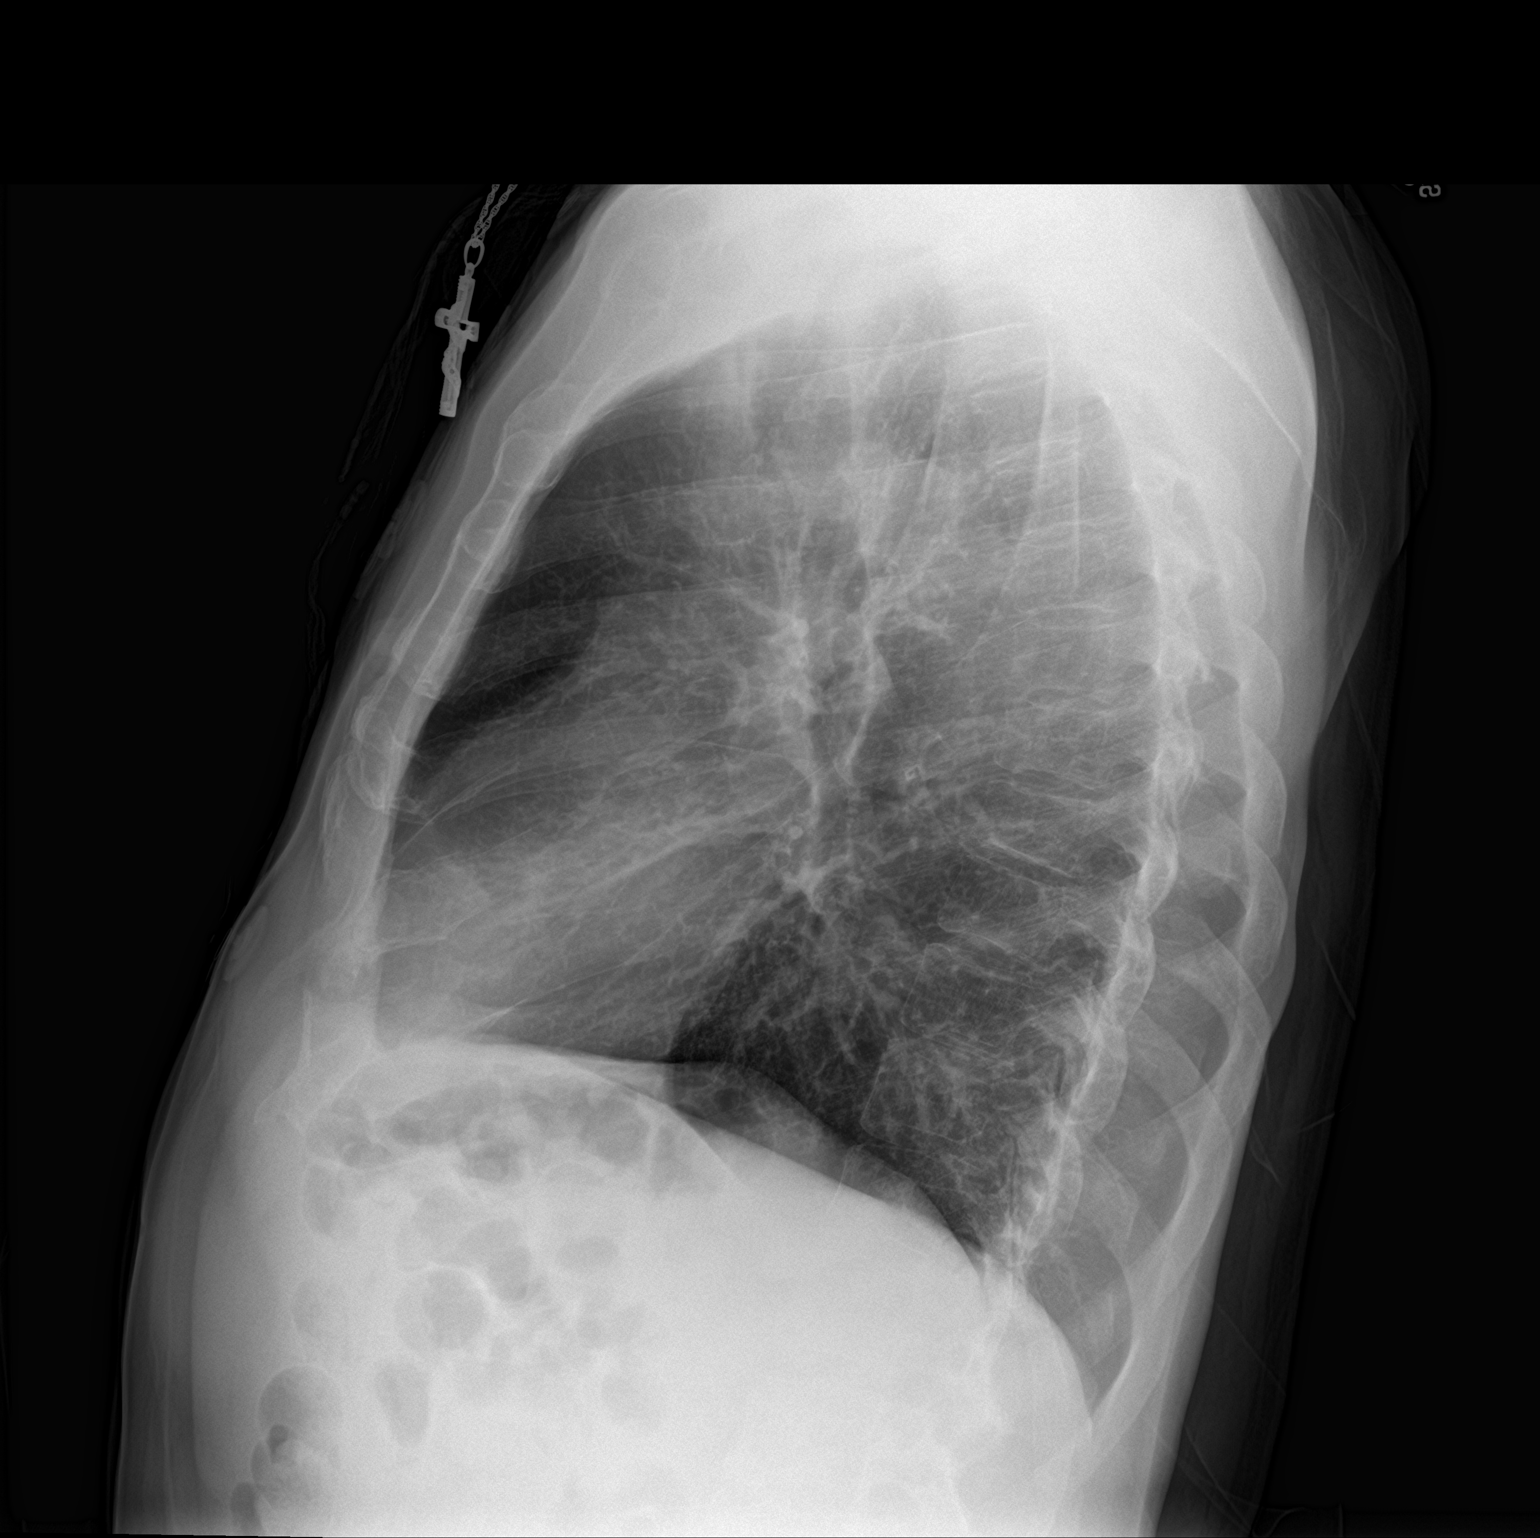

[2 of 2 positions shown; findings below may reference images not displayed]

FINDINGS: Cardiac and mediastinal silhouettes are stable in size and contour,
and remain within normal limits. Aortic atherosclerosis.

Lungs normally inflated. Chronic changes related to COPD present. No
focal infiltrates. No pulmonary edema or pleural effusion. No
pneumothorax. Nipple shadows overlie the mid lower lungs
bilaterally. There is a 5 mm nodular density overlying the
peripheral left upper lobe.

No acute osseus abnormality.
IMPRESSION: 1. COPD.  No other active cardiopulmonary disease.
2. Follow-up examination with dedicated cross-sectional imaging of
the chest suggested for complete evaluation.

## 2019-10-26 ENCOUNTER — Other Ambulatory Visit: Payer: Self-pay | Admitting: *Deleted

## 2019-10-26 NOTE — Patient Outreach (Signed)
[facility]) Care Management  [place]  10/26/2019   [name] [name] 06/03/1951 [id number]  RN Health Coach telephone call to patient.  [employer] compliance verified. Per patient he is doing good. Patient stated that his appetite is very good.Patient has not been able to get signed up for the COVID vaccine. RN Health Coach assisted patient to get on the waiting list. Patient has had a recent ed visit for COPD exacerbation. Patient has a trach. He suctioned self, used rescue inhaler and nebulizer before calling 911. Patient has agreed to follow up outreach calls.    Encounter Medications:  Outpatient Encounter Medications as of 10/26/2019  Medication Sig  . albuterol (PROVENTIL HFA;VENTOLIN HFA) 108 (90 Base) MCG/ACT inhaler Inhale 2 puffs into the lungs every 6 (six) hours as needed for wheezing or shortness of breath.  Marland Kitchen albuterol (PROVENTIL) (2.5 MG/3ML) 0.083% nebulizer solution Take 3-6 mLs (2.5-5 mg total) by nebulization every 4 (four) hours as needed for wheezing or shortness of breath.  . budesonide-formoterol (SYMBICORT) 160-4.5 MCG/ACT inhaler INHALE TWO PUFFS BY MOUTH FIRST THING IN THE MORNING AND THEN ANOTHER TWO PUFFS 12 HOURS LATER  . cetirizine (ZYRTEC) 10 MG tablet Take 10 mg by mouth daily as needed for allergies.   . Ciclopirox 0.77 % gel Apply 1 application topically 2 (two) times daily. Apply to nails  . doxycycline (VIBRA-TABS) 100 MG tablet Take 1 tablet (100 mg total) by mouth 2 (two) times daily.  . finasteride (PROSCAR) 5 MG tablet Take 5 mg by mouth daily.  . fluticasone (FLONASE) 50 MCG/ACT nasal spray Place 2 sprays into both nostrils daily.  . hydrochlorothiazide (HYDRODIURIL) 25 MG tablet Take 1 tablet (25 mg total) by mouth daily.  Marland Kitchen losartan (COZAAR) 25 MG tablet Take 25 mg by mouth daily.  . Multiple Vitamin (MULTIVITAMIN) tablet Take 1 tablet by mouth daily.  . pantoprazole (PROTONIX) 40 MG tablet TAKE 1 TABLET(40 MG) BY MOUTH DAILY 30  TO 60 MINUTES BEFORE FIRST MEAL OF THE DAY (Patient taking differently: Take 40 mg by mouth daily before breakfast. )  . predniSONE (DELTASONE) 50 MG tablet Take 1 tablet (50 mg total) by mouth daily.  . sodium chloride 0.9 % nebulizer solution Take 3-4 mLs by nebulization daily as needed. Use 3-4 drops into Trach to loosen phlegm suction secretions immediatly after.  . terazosin (HYTRIN) 5 MG capsule Take 5 mg by mouth at bedtime.   . traMADol (ULTRAM) 50 MG tablet Take 1-2 tablets (50-100 mg total) by mouth every 6 (six) hours as needed for moderate pain or severe pain. Post-operatively (Patient not taking: Reported on 09/10/2019)   No facility-administered encounter medications on file as of 10/26/2019.    Functional Status:  In your present state of health, do you have any difficulty performing the following activities: 07/26/2019 06/01/2019  Hearing? N N  Vision? N N  Difficulty concentrating or making decisions? N N  Walking or climbing stairs? Y Y  Comment shortness of breath when walk fast or upstairs gets short of breath on moderate exertion  Dressing or bathing? N N  Doing errands, shopping? N N  Preparing Food and eating ? N N  Using the Toilet? N N  In the past six months, have you accidently leaked urine? Y Y  Do you have problems with loss of bowel control? N N  Managing your Medications? N N  Managing your Finances? N N  Housekeeping or managing your Housekeeping? - N  Some recent data  might be hidden    Fall/Depression Screening: Fall Risk  10/26/2019 07/26/2019 06/01/2019  Falls in the past year? 0 0 0  Number falls in past yr: 1 0 -  Injury with Fall? 0 0 0  Follow up Falls evaluation completed Falls evaluation completed Falls evaluation completed   PHQ 2/9 Scores 10/26/2019 07/26/2019 06/01/2019 03/02/2019 10/05/2018  PHQ - 2 Score 0 0 0 0 0    Assessment:  Patient has had ER visit since last outreach Patient used the COPD action plan Patient has not received the  COVID vaccine  Plan:  RN reiterated COPD zones and action plan RN Health Coach assisted patient in being placed on the COVID vaccine waiting list RN sent ensure coupons RN discussed health maintenance RN will follow up within the month of [place] [facility] [phone]

## 2019-10-27 DIAGNOSIS — Z930 Tracheostomy status: Secondary | ICD-10-CM | POA: Diagnosis not present

## 2019-10-27 DIAGNOSIS — Z430 Encounter for attention to tracheostomy: Secondary | ICD-10-CM | POA: Diagnosis not present

## 2019-10-27 DIAGNOSIS — J449 Chronic obstructive pulmonary disease, unspecified: Secondary | ICD-10-CM | POA: Diagnosis not present

## 2019-10-27 DIAGNOSIS — E785 Hyperlipidemia, unspecified: Secondary | ICD-10-CM | POA: Diagnosis not present

## 2019-10-27 DIAGNOSIS — I10 Essential (primary) hypertension: Secondary | ICD-10-CM | POA: Diagnosis not present

## 2019-11-13 ENCOUNTER — Ambulatory Visit: Payer: Medicare Other | Attending: Internal Medicine

## 2019-11-13 ENCOUNTER — Other Ambulatory Visit: Payer: Self-pay

## 2019-11-13 DIAGNOSIS — Z23 Encounter for immunization: Secondary | ICD-10-CM

## 2019-11-13 NOTE — Progress Notes (Signed)
   Covid-19 Vaccination Clinic  Name:  [name] [name]    MRN: [id number] DOB: December 09, 1950  11/13/2019  [name] [name] was observed post Covid-19 immunization for 15 minutes without incidence. He was provided with Vaccine Information Sheet and instruction to access the V-Safe system.   [name] [name] was instructed to call 911 with any severe reactions post vaccine: Marland Kitchen Difficulty breathing  . Swelling of your face and throat  . A fast heartbeat  . A bad rash all over your body  . Dizziness and weakness    Immunizations Administered    Name Date Dose VIS Date Route   Pfizer COVID-19 Vaccine 11/13/2019 12:52 PM 0.3 mL 08/25/2019 Intramuscular   Manufacturer: [place]   Lot: [id number]   [place]: [id number]

## 2019-11-17 IMAGING — DX DG CHEST 2V
2 series · 2 of 2 positions shown · non-contrast
Comparison: PA and lateral chest 01/10/2018, 12/14/2017 and
10/21/2016.

CLINICAL DATA: Chronic cough, shortness of breath and wheezing.

EXAM:
CHEST - 2 VIEW

[chest pa]
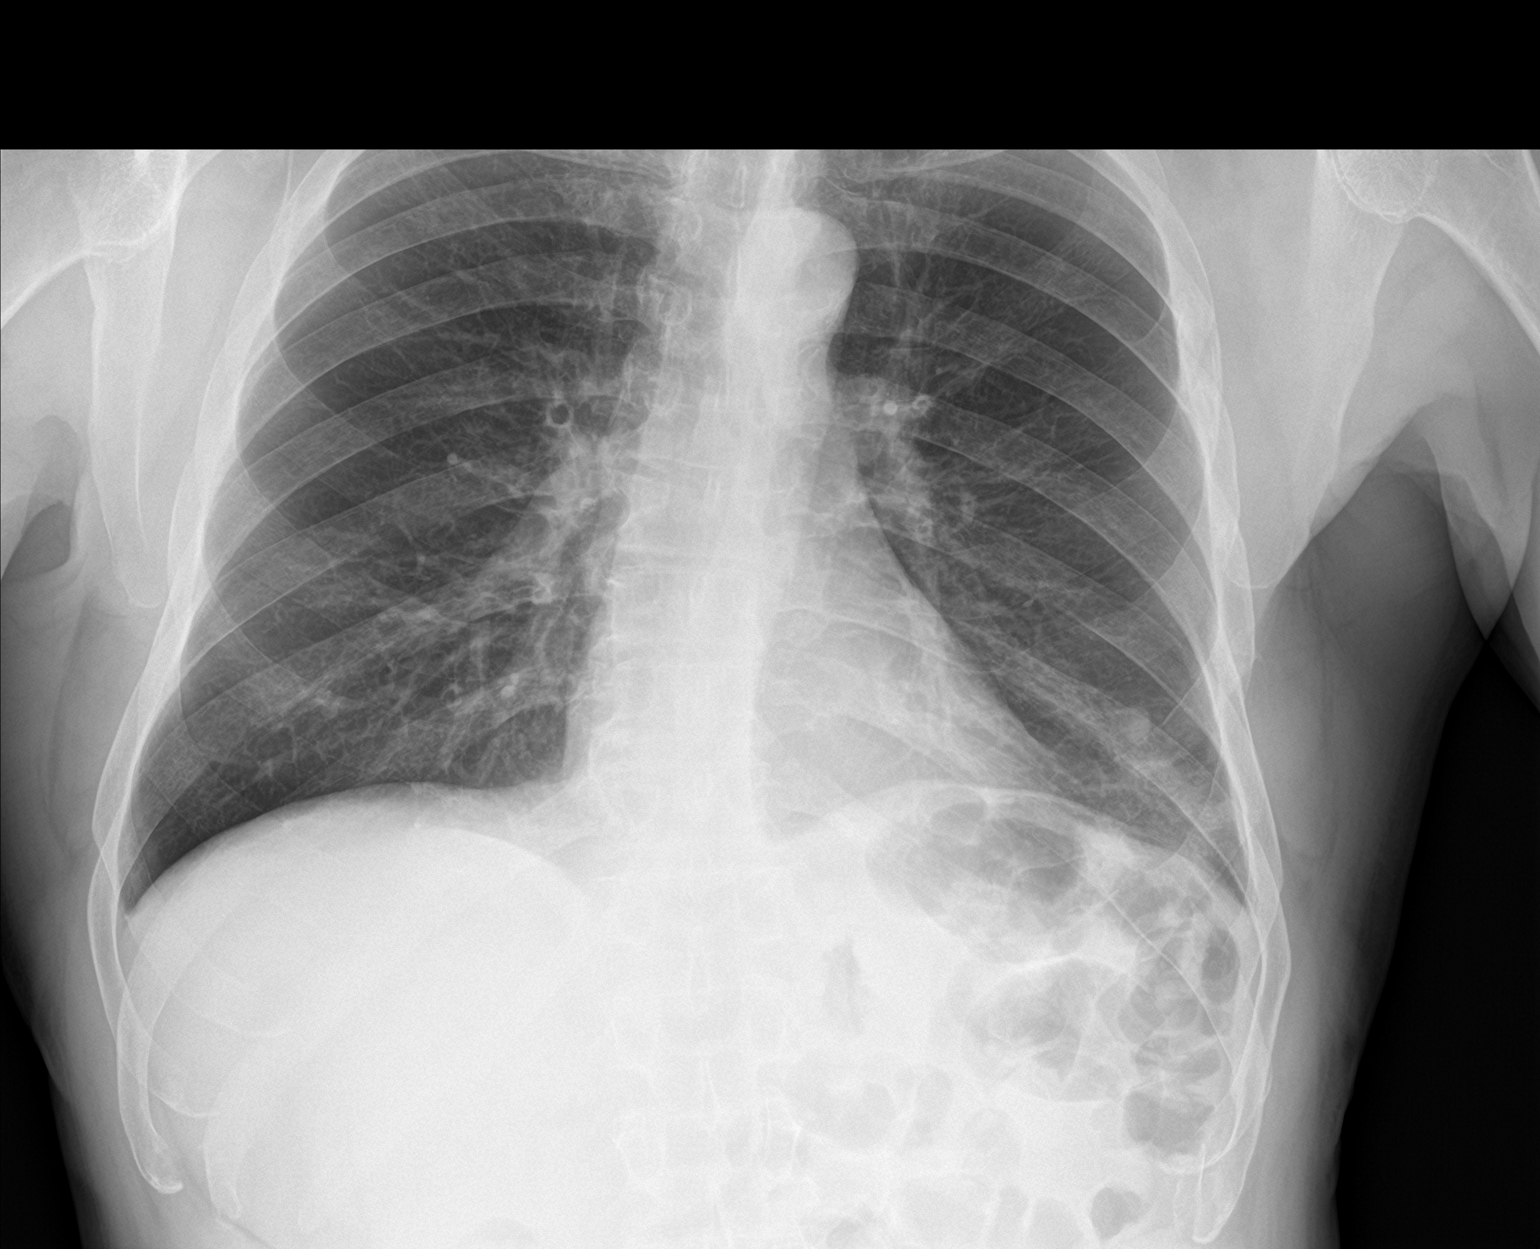

[chest lat]
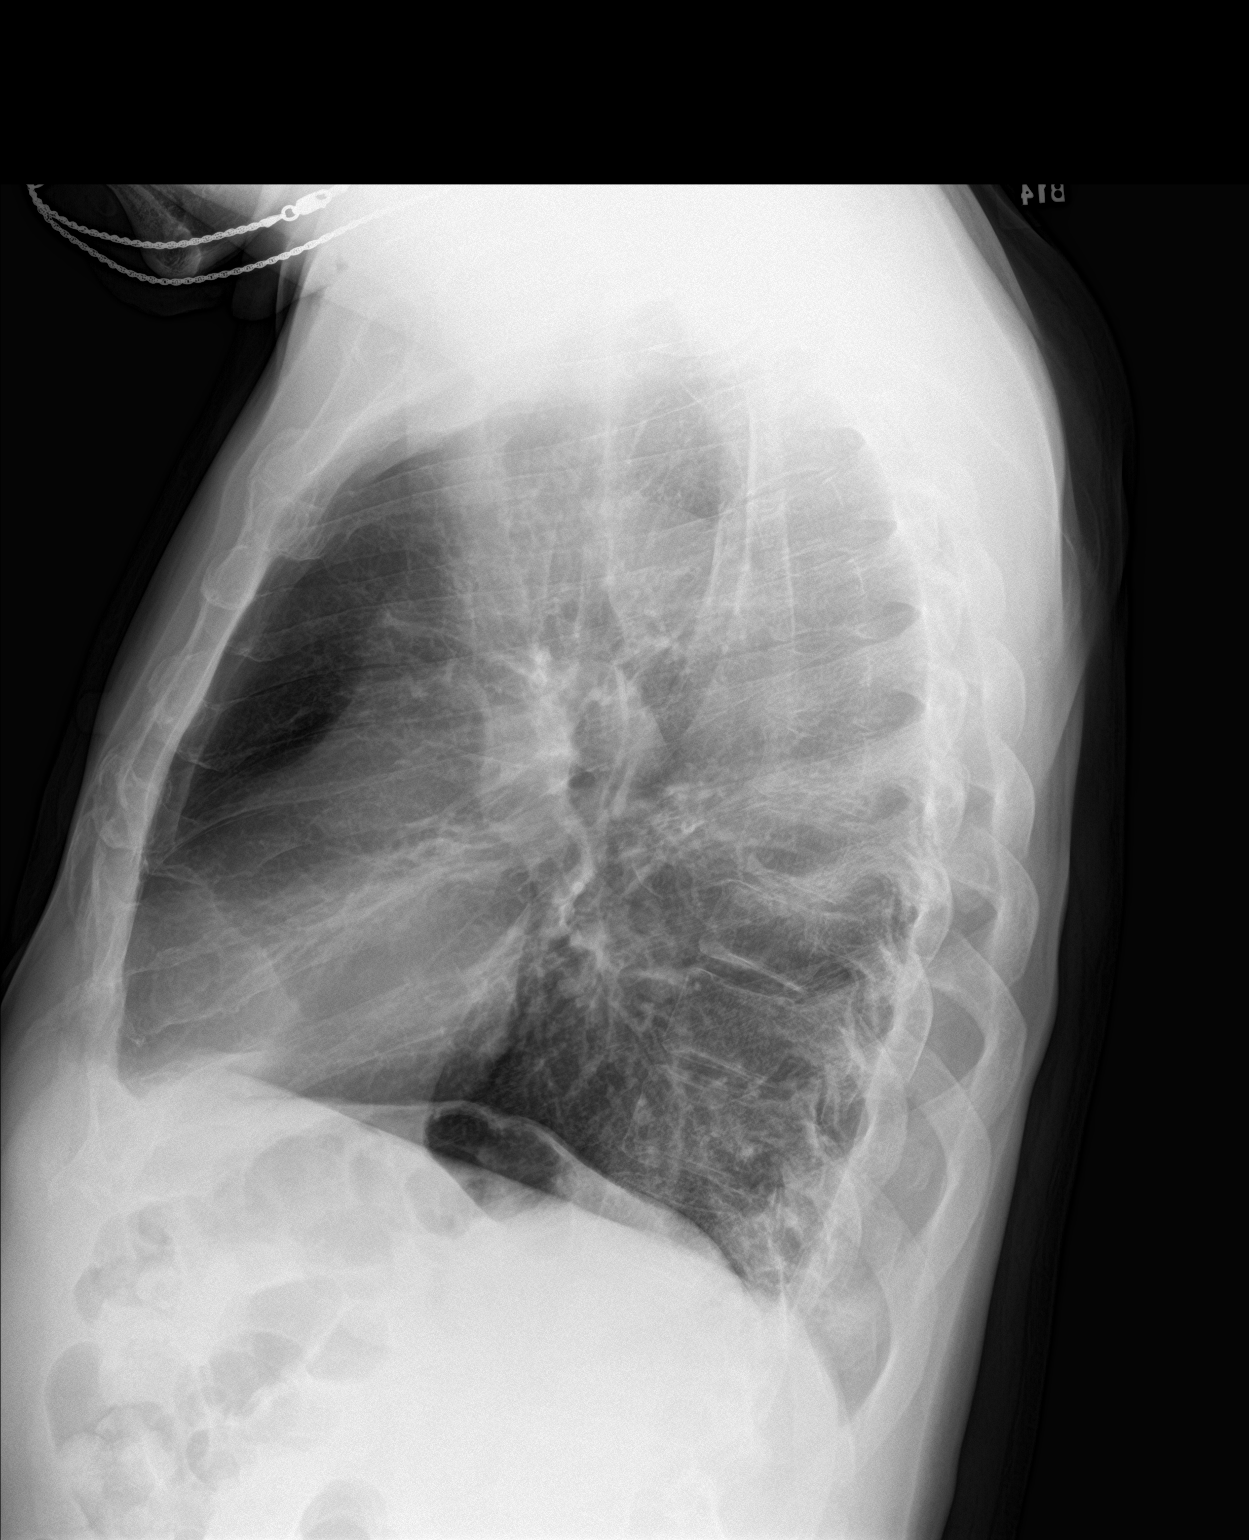

[2 of 2 positions shown; findings below may reference images not displayed]

FINDINGS: Small focus of patchy airspace disease is seen in the left lung base
and new since the most recent exam. Possible nodular opacity
projecting over the posterior arc of the left fourth rib seen on the
most recent study is not visualized today. The right lung is clear.
Nipple shadow on the left is noted. No pneumothorax or pleural
effusion. Heart size is normal. Atherosclerosis is noted. No focal
bony abnormality.
IMPRESSION: Small focus of patchy airspace disease in the left lung base is new
since the most recent exam and likely due to atelectasis but could
be secondary to infection.

Possible nodular opacity in the left upper lobe seen on the most
recent examination is not present on today's study.

## 2019-11-29 DIAGNOSIS — Z930 Tracheostomy status: Secondary | ICD-10-CM | POA: Diagnosis not present

## 2019-11-29 DIAGNOSIS — Z430 Encounter for attention to tracheostomy: Secondary | ICD-10-CM | POA: Diagnosis not present

## 2019-11-29 DIAGNOSIS — J3802 Paralysis of vocal cords and larynx, bilateral: Secondary | ICD-10-CM | POA: Diagnosis not present

## 2019-12-01 DIAGNOSIS — J3802 Paralysis of vocal cords and larynx, bilateral: Secondary | ICD-10-CM | POA: Diagnosis not present

## 2019-12-01 DIAGNOSIS — Z430 Encounter for attention to tracheostomy: Secondary | ICD-10-CM | POA: Diagnosis not present

## 2019-12-04 DIAGNOSIS — Z930 Tracheostomy status: Secondary | ICD-10-CM | POA: Diagnosis not present

## 2019-12-04 DIAGNOSIS — Z430 Encounter for attention to tracheostomy: Secondary | ICD-10-CM | POA: Diagnosis not present

## 2019-12-06 ENCOUNTER — Ambulatory Visit: Payer: No Typology Code available for payment source | Attending: Internal Medicine

## 2019-12-06 DIAGNOSIS — Z23 Encounter for immunization: Secondary | ICD-10-CM

## 2019-12-06 NOTE — Progress Notes (Signed)
   Covid-19 Vaccination Clinic  Name:  [name] [name]    MRN: [id number] DOB: September 24, 1950  12/06/2019  [name] was observed post Covid-19 immunization for 15 minutes without incident. He was provided with Vaccine Information Sheet and instruction to access the V-Safe system.   [name] was instructed to call 911 with any severe reactions post vaccine: Marland Kitchen Difficulty breathing  . Swelling of face and throat  . A fast heartbeat  . A bad rash all over body  . Dizziness and weakness   Immunizations Administered    Name Date Dose VIS Date Route   Pfizer COVID-19 Vaccine 12/06/2019 10:55 AM 0.3 mL 08/25/2019 Intramuscular   Manufacturer: [place]   Lot: [id number]   [place]: [id number]

## 2019-12-19 IMAGING — DX DG CHEST 1V PORT
1 series · 1 of 1 positions shown · non-contrast
Comparison: 02/02/2018

CLINICAL DATA: Shortness of breath.  History of COPD.

EXAM:
PORTABLE CHEST 1 VIEW

[chest ap]
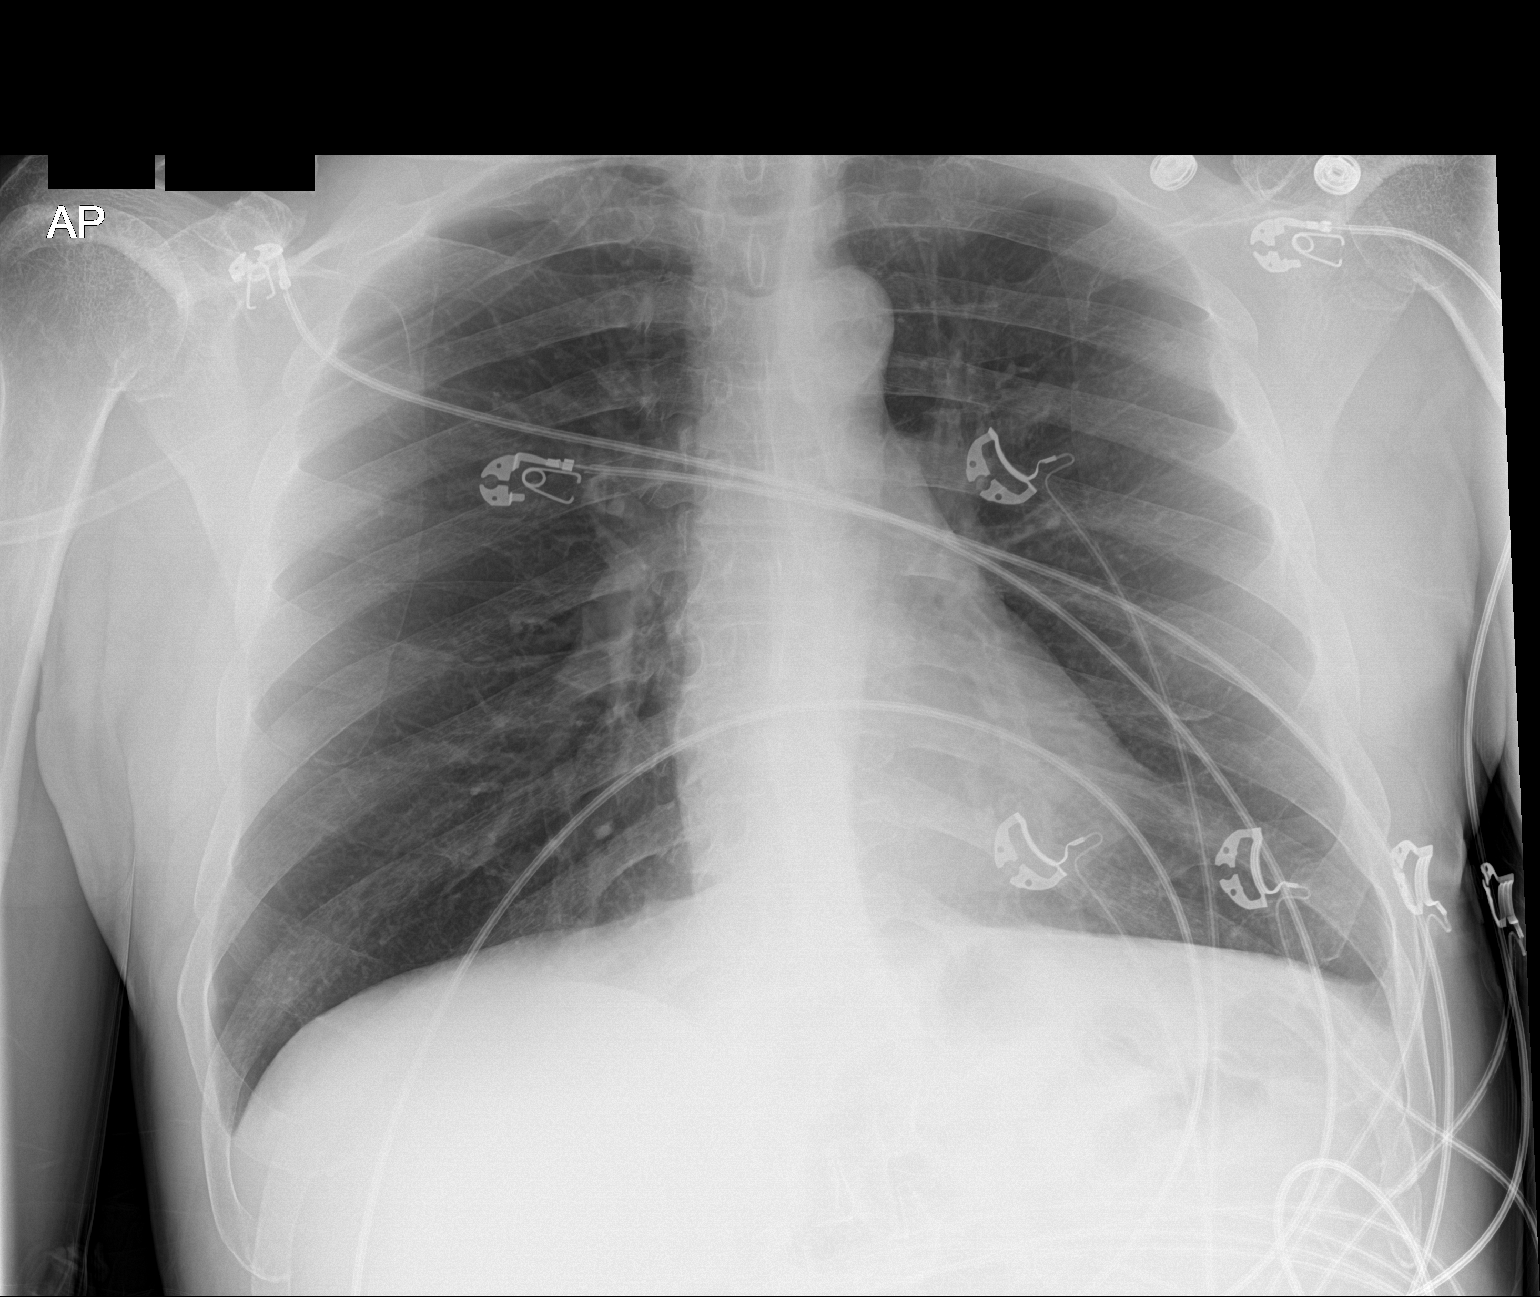

[1 of 1 positions shown; findings below may reference images not displayed]

FINDINGS: The cardiomediastinal silhouette is unchanged and within normal
limits. The lungs are mildly hyperinflated. Patchy left basilar
airspace opacity on the prior study has resolved. The lungs are also
clear elsewhere. There is blunting scratched of there is new
blunting of the left lateral costophrenic angle. No pneumothorax is
identified. No acute osseous abnormality is seen.
IMPRESSION: 1. Clear lungs.
2. Trace left pleural effusion.

## 2019-12-19 IMAGING — DX DG ABDOMEN 1V
1 series · 1 of 1 positions shown · non-contrast
Comparison: Abdominal radiograph 10/07/2015.

CLINICAL DATA: 66-year-old male with history of abdominal pain for
1 hour.

EXAM:
ABDOMEN - 1 VIEW

[abdomen kub]
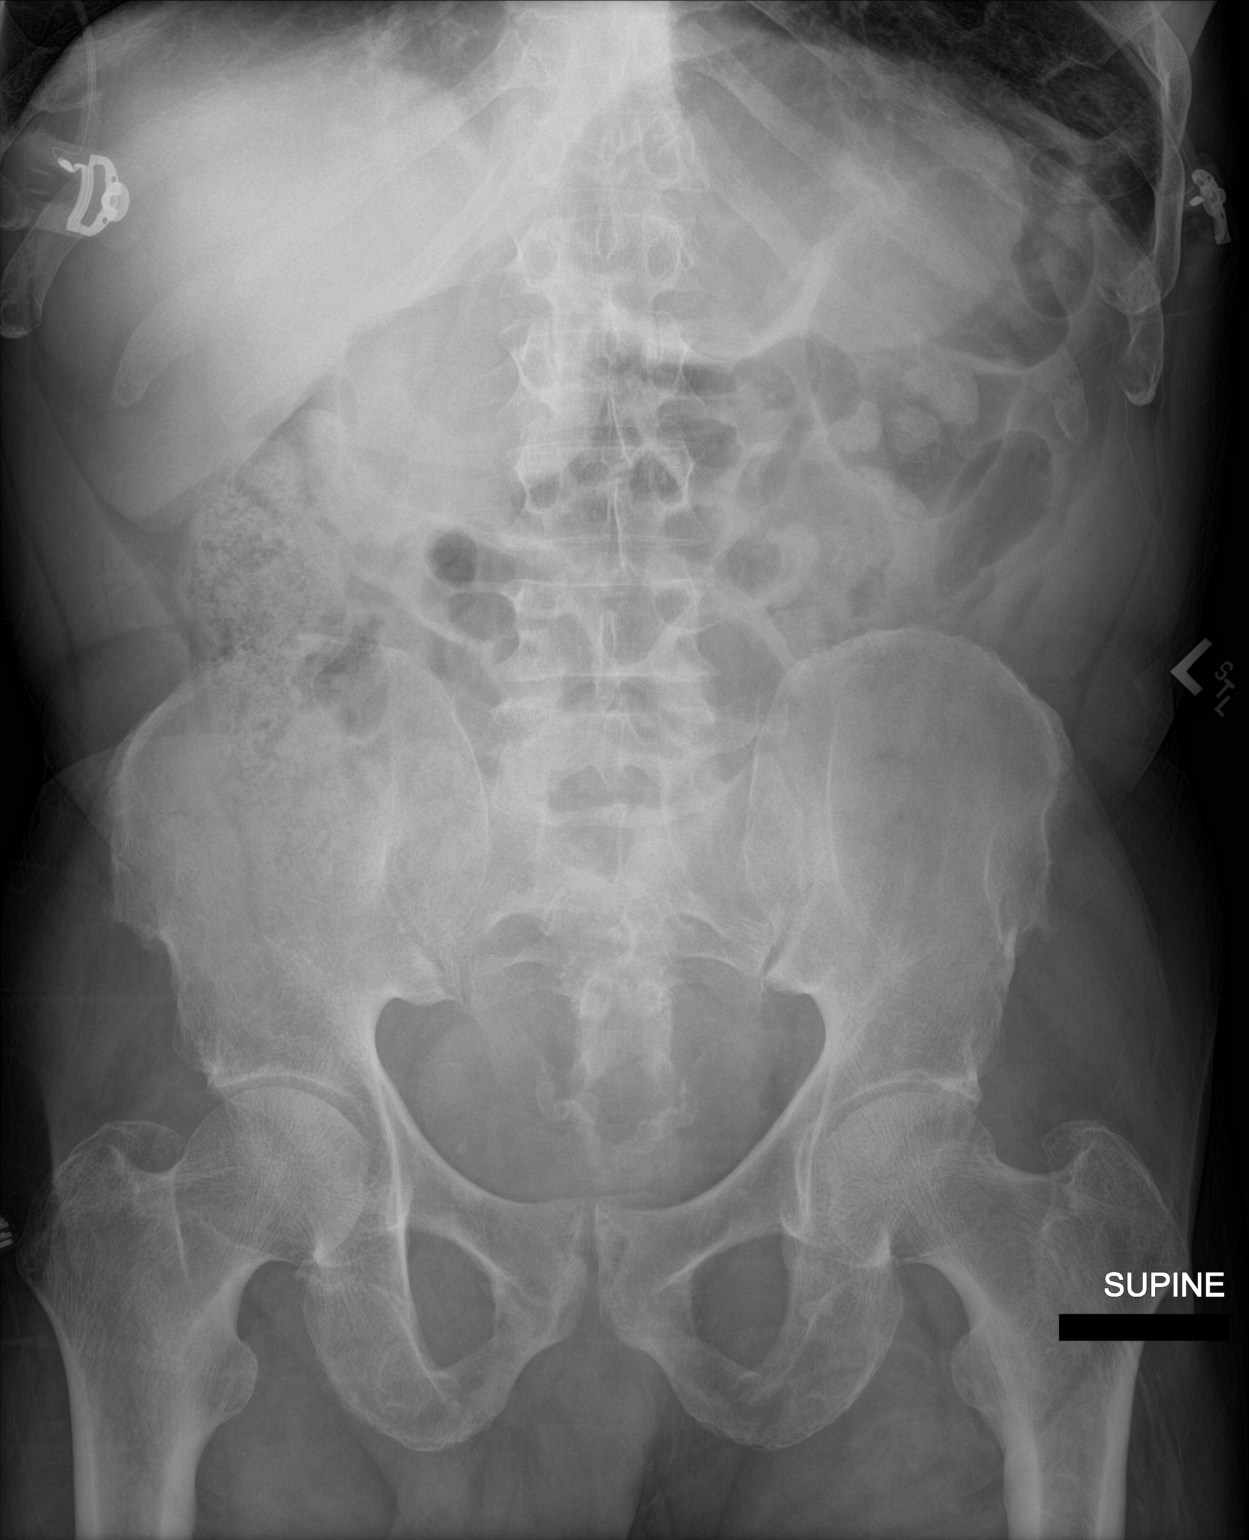

[1 of 1 positions shown; findings below may reference images not displayed]

FINDINGS: Gas and stool are seen scattered throughout the colon extending to
the level of the distal rectum. No pathologic distension of small
bowel is noted. No gross evidence of pneumoperitoneum.
IMPRESSION: 1. Nonobstructive bowel gas pattern.
2. No pneumoperitoneum.

## 2019-12-23 ENCOUNTER — Encounter (HOSPITAL_COMMUNITY): Payer: Self-pay

## 2019-12-23 ENCOUNTER — Inpatient Hospital Stay (HOSPITAL_COMMUNITY)
Admission: EM | Admit: 2019-12-23 | Discharge: 2019-12-28 | DRG: 183 | Disposition: A | Discharge status: Home Health | Payer: No Typology Code available for payment source | Attending: General Surgery | Admitting: General Surgery

## 2019-12-23 ENCOUNTER — Emergency Department (HOSPITAL_COMMUNITY): Payer: No Typology Code available for payment source

## 2019-12-23 ENCOUNTER — Other Ambulatory Visit: Payer: Self-pay

## 2019-12-23 DIAGNOSIS — Z7951 Long term (current) use of inhaled steroids: Secondary | ICD-10-CM

## 2019-12-23 DIAGNOSIS — Z79899 Other long term (current) drug therapy: Secondary | ICD-10-CM

## 2019-12-23 DIAGNOSIS — I10 Essential (primary) hypertension: Secondary | ICD-10-CM | POA: Diagnosis not present

## 2019-12-23 DIAGNOSIS — J449 Chronic obstructive pulmonary disease, unspecified: Secondary | ICD-10-CM | POA: Diagnosis not present

## 2019-12-23 DIAGNOSIS — T797XXA Traumatic subcutaneous emphysema, initial encounter: Secondary | ICD-10-CM | POA: Diagnosis present

## 2019-12-23 DIAGNOSIS — R0902 Hypoxemia: Secondary | ICD-10-CM | POA: Diagnosis not present

## 2019-12-23 DIAGNOSIS — S272XXA Traumatic hemopneumothorax, initial encounter: Secondary | ICD-10-CM | POA: Diagnosis present

## 2019-12-23 DIAGNOSIS — M1990 Unspecified osteoarthritis, unspecified site: Secondary | ICD-10-CM | POA: Diagnosis present

## 2019-12-23 DIAGNOSIS — S225XXA Flail chest, initial encounter for closed fracture: Principal | ICD-10-CM | POA: Diagnosis present

## 2019-12-23 DIAGNOSIS — S2239XA Fracture of one rib, unspecified side, initial encounter for closed fracture: Secondary | ICD-10-CM

## 2019-12-23 DIAGNOSIS — K429 Umbilical hernia without obstruction or gangrene: Secondary | ICD-10-CM | POA: Diagnosis present

## 2019-12-23 DIAGNOSIS — S2249XA Multiple fractures of ribs, unspecified side, initial encounter for closed fracture: Secondary | ICD-10-CM | POA: Diagnosis present

## 2019-12-23 DIAGNOSIS — S42032A Displaced fracture of lateral end of left clavicle, initial encounter for closed fracture: Secondary | ICD-10-CM | POA: Diagnosis present

## 2019-12-23 DIAGNOSIS — S42115A Nondisplaced fracture of body of scapula, left shoulder, initial encounter for closed fracture: Secondary | ICD-10-CM | POA: Diagnosis not present

## 2019-12-23 DIAGNOSIS — S42025A Nondisplaced fracture of shaft of left clavicle, initial encounter for closed fracture: Secondary | ICD-10-CM | POA: Diagnosis not present

## 2019-12-23 DIAGNOSIS — S270XXA Traumatic pneumothorax, initial encounter: Secondary | ICD-10-CM

## 2019-12-23 DIAGNOSIS — Z743 Need for continuous supervision: Secondary | ICD-10-CM | POA: Diagnosis not present

## 2019-12-23 DIAGNOSIS — Z825 Family history of asthma and other chronic lower respiratory diseases: Secondary | ICD-10-CM

## 2019-12-23 DIAGNOSIS — I959 Hypotension, unspecified: Secondary | ICD-10-CM | POA: Diagnosis not present

## 2019-12-23 DIAGNOSIS — Z20822 Contact with and (suspected) exposure to covid-19: Secondary | ICD-10-CM | POA: Diagnosis present

## 2019-12-23 DIAGNOSIS — Z23 Encounter for immunization: Secondary | ICD-10-CM | POA: Diagnosis not present

## 2019-12-23 DIAGNOSIS — S2242XA Multiple fractures of ribs, left side, initial encounter for closed fracture: Secondary | ICD-10-CM

## 2019-12-23 DIAGNOSIS — S2231XA Fracture of one rib, right side, initial encounter for closed fracture: Secondary | ICD-10-CM | POA: Diagnosis not present

## 2019-12-23 DIAGNOSIS — Z930 Tracheostomy status: Secondary | ICD-10-CM | POA: Diagnosis not present

## 2019-12-23 DIAGNOSIS — D649 Anemia, unspecified: Secondary | ICD-10-CM | POA: Diagnosis not present

## 2019-12-23 DIAGNOSIS — F419 Anxiety disorder, unspecified: Secondary | ICD-10-CM | POA: Diagnosis not present

## 2019-12-23 DIAGNOSIS — S42022A Displaced fracture of shaft of left clavicle, initial encounter for closed fracture: Secondary | ICD-10-CM

## 2019-12-23 DIAGNOSIS — S42002A Fracture of unspecified part of left clavicle, initial encounter for closed fracture: Secondary | ICD-10-CM | POA: Diagnosis not present

## 2019-12-23 DIAGNOSIS — Y92410 Unspecified street and highway as the place of occurrence of the external cause: Secondary | ICD-10-CM | POA: Diagnosis not present

## 2019-12-23 DIAGNOSIS — R52 Pain, unspecified: Secondary | ICD-10-CM | POA: Diagnosis not present

## 2019-12-23 DIAGNOSIS — S42102A Fracture of unspecified part of scapula, left shoulder, initial encounter for closed fracture: Secondary | ICD-10-CM | POA: Diagnosis present

## 2019-12-23 DIAGNOSIS — J939 Pneumothorax, unspecified: Secondary | ICD-10-CM

## 2019-12-23 DIAGNOSIS — R079 Chest pain, unspecified: Secondary | ICD-10-CM | POA: Diagnosis not present

## 2019-12-23 DIAGNOSIS — S42009A Fracture of unspecified part of unspecified clavicle, initial encounter for closed fracture: Secondary | ICD-10-CM

## 2019-12-23 LAB — COMPREHENSIVE METABOLIC PANEL
ALT: 37 U/L (ref 0–44)
AST: 53 U/L — ABNORMAL HIGH (ref 15–41)
Albumin: 3.3 g/dL — ABNORMAL LOW (ref 3.5–5.0)
Alkaline Phosphatase: 88 U/L (ref 38–126)
Anion gap: 11 (ref 5–15)
BUN: 16 mg/dL (ref 8–23)
CO2: 21 mmol/L — ABNORMAL LOW (ref 22–32)
Calcium: 8.7 mg/dL — ABNORMAL LOW (ref 8.9–10.3)
Chloride: 103 mmol/L (ref 98–111)
Creatinine, Ser: 1.09 mg/dL (ref 0.61–1.24)
GFR calc Af Amer: >60 mL/min (ref >60)
GFR calc non Af Amer: >60 mL/min (ref >60)
Glucose, Bld: 117 mg/dL — ABNORMAL HIGH (ref 70–99)
Potassium: 3.5 mmol/L (ref 3.5–5.1)
Sodium: 135 mmol/L (ref 135–145)
Total Bilirubin: 0.4 mg/dL (ref 0.3–1.2)
Total Protein: 6.9 g/dL (ref 6.5–8.1)

## 2019-12-23 LAB — CBC WITH DIFFERENTIAL/PLATELET
Abs Immature Granulocytes: 0.1 10*3/uL — ABNORMAL HIGH (ref 0.00–0.07)
Basophils Absolute: 0 10*3/uL (ref 0.0–0.1)
Basophils Relative: 0 %
Eosinophils Absolute: 0.3 10*3/uL (ref 0.0–0.5)
Eosinophils Relative: 3 %
HCT: 37.3 % — ABNORMAL LOW (ref 39.0–52.0)
Hemoglobin: 12.7 g/dL — ABNORMAL LOW (ref 13.0–17.0)
Immature Granulocytes: 1 %
Lymphocytes Relative: 24 %
Lymphs Abs: 2.2 10*3/uL (ref 0.7–4.0)
MCH: 31.2 pg (ref 26.0–34.0)
MCHC: 34 g/dL (ref 30.0–36.0)
MCV: 91.6 fL (ref 80.0–100.0)
Monocytes Absolute: 0.8 10*3/uL (ref 0.1–1.0)
Monocytes Relative: 9 %
Neutro Abs: 5.7 10*3/uL (ref 1.7–7.7)
Neutrophils Relative %: 63 %
Platelets: 255 10*3/uL (ref 150–400)
RBC: 4.07 MIL/uL — ABNORMAL LOW (ref 4.22–5.81)
RDW: 14.2 % (ref 11.5–15.5)
WBC: 9.2 10*3/uL (ref 4.0–10.5)
nRBC: 0 % (ref 0.0–0.2)

## 2019-12-23 LAB — RESPIRATORY PANEL BY RT PCR (FLU A&B, COVID)
Influenza A by PCR: NEGATIVE
Influenza B by PCR: NEGATIVE
SARS Coronavirus 2 by RT PCR: NEGATIVE

## 2019-12-23 LAB — MRSA PCR SCREENING: MRSA by PCR: NEGATIVE

## 2019-12-23 LAB — HIV ANTIBODY (ROUTINE TESTING W REFLEX): HIV Screen 4th Generation wRfx: NONREACTIVE

## 2019-12-23 MED ORDER — MORPHINE SULFATE (PF) 4 MG/ML IV SOLN
4.0000 mg | Freq: Once | INTRAVENOUS | Status: AC
  Administered 2019-12-23: 4 mg via INTRAVENOUS
  Filled 2019-12-23: qty 1

## 2019-12-23 MED ORDER — METOPROLOL TARTRATE 5 MG/5ML IV SOLN: 5.0000 mg | Freq: Four times a day (QID) | INTRAVENOUS | Status: DC | PRN

## 2019-12-23 MED ORDER — SODIUM CHLORIDE 0.9 % IV BOLUS
1000.0000 mL | Freq: Once | INTRAVENOUS | Status: AC
  Administered 2019-12-23: 16:00:00 1000 mL via INTRAVENOUS

## 2019-12-23 MED ORDER — IPRATROPIUM-ALBUTEROL 0.5-2.5 (3) MG/3ML IN SOLN
3.0000 mL | Freq: Four times a day (QID) | RESPIRATORY_TRACT | Status: DC
  Administered 2019-12-23: 3 mL via RESPIRATORY_TRACT
  Filled 2019-12-23: qty 3

## 2019-12-23 MED ORDER — PANTOPRAZOLE SODIUM 40 MG PO TBEC
40.0000 mg | DELAYED_RELEASE_TABLET | Freq: Every day | ORAL | Status: DC
  Administered 2019-12-23 – 2019-12-28 (×6): 40 mg via ORAL
  Filled 2019-12-23 (×6): qty 1

## 2019-12-23 MED ORDER — ENOXAPARIN SODIUM 40 MG/0.4ML ~~LOC~~ SOLN
40.0000 mg | SUBCUTANEOUS | Status: DC
  Administered 2019-12-23 – 2019-12-27 (×5): 40 mg via SUBCUTANEOUS
  Filled 2019-12-23 (×5): qty 0.4

## 2019-12-23 MED ORDER — TETANUS-DIPHTH-ACELL PERTUSSIS 5-2.5-18.5 LF-MCG/0.5 IM SUSP
0.5000 mL | Freq: Once | INTRAMUSCULAR | Status: AC
  Administered 2019-12-23: 0.5 mL via INTRAMUSCULAR
  Filled 2019-12-23: qty 0.5

## 2019-12-23 MED ORDER — PANTOPRAZOLE SODIUM 40 MG IV SOLR
40.0000 mg | Freq: Every day | INTRAVENOUS | Status: DC
  Filled 2019-12-23: qty 40

## 2019-12-23 MED ORDER — OXYCODONE HCL 5 MG PO TABS
5.0000 mg | ORAL_TABLET | ORAL | Status: DC | PRN
  Administered 2019-12-23 – 2019-12-28 (×10): 5 mg via ORAL
  Filled 2019-12-23 (×10): qty 1

## 2019-12-23 MED ORDER — SODIUM CHLORIDE 0.9 % IV SOLN: INTRAVENOUS | Status: DC

## 2019-12-23 MED ORDER — OXYCODONE-ACETAMINOPHEN 5-325 MG PO TABS: 2.0000 | ORAL_TABLET | Freq: Once | ORAL | Status: DC

## 2019-12-23 MED ORDER — ONDANSETRON HCL 4 MG/2ML IJ SOLN: 4.0000 mg | Freq: Four times a day (QID) | INTRAMUSCULAR | Status: DC | PRN

## 2019-12-23 MED ORDER — ACETAMINOPHEN 500 MG PO TABS: 1000.0000 mg | ORAL_TABLET | Freq: Once | ORAL | Status: DC

## 2019-12-23 MED ORDER — DOCUSATE SODIUM 100 MG PO CAPS
100.0000 mg | ORAL_CAPSULE | Freq: Two times a day (BID) | ORAL | Status: DC
  Administered 2019-12-24 – 2019-12-28 (×8): 100 mg via ORAL
  Filled 2019-12-23 (×9): qty 1

## 2019-12-23 MED ORDER — HYDROMORPHONE HCL 1 MG/ML IJ SOLN
1.0000 mg | INTRAMUSCULAR | Status: DC | PRN
  Administered 2019-12-23 – 2019-12-25 (×7): 1 mg via INTRAVENOUS
  Filled 2019-12-23 (×9): qty 1

## 2019-12-23 MED ORDER — ACETAMINOPHEN 325 MG PO TABS
650.0000 mg | ORAL_TABLET | ORAL | Status: DC | PRN
  Administered 2019-12-25: 650 mg via ORAL
  Filled 2019-12-23: qty 2

## 2019-12-23 MED ORDER — IOHEXOL 300 MG/ML  SOLN
100.0000 mL | Freq: Once | INTRAMUSCULAR | Status: AC | PRN
  Administered 2019-12-23: 100 mL via INTRAVENOUS

## 2019-12-23 MED ORDER — KETOROLAC TROMETHAMINE 30 MG/ML IJ SOLN
30.0000 mg | Freq: Four times a day (QID) | INTRAMUSCULAR | Status: DC | PRN
  Administered 2019-12-24 – 2019-12-28 (×10): 30 mg via INTRAVENOUS
  Filled 2019-12-23 (×10): qty 1

## 2019-12-23 MED ORDER — ONDANSETRON 4 MG PO TBDP: 4.0000 mg | ORAL_TABLET | Freq: Four times a day (QID) | ORAL | Status: DC | PRN
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
## 2019-12-23 NOTE — ED Notes (Signed)
sats 89% RA; pt placed on 3L via [place]; sats 91-93%
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
## 2019-12-24 ENCOUNTER — Inpatient Hospital Stay (HOSPITAL_COMMUNITY): Payer: No Typology Code available for payment source

## 2019-12-24 LAB — CBC
HCT: 36.1 % — ABNORMAL LOW (ref 39.0–52.0)
Hemoglobin: 12.1 g/dL — ABNORMAL LOW (ref 13.0–17.0)
MCH: 30.9 pg (ref 26.0–34.0)
MCHC: 33.5 g/dL (ref 30.0–36.0)
MCV: 92.1 fL (ref 80.0–100.0)
Platelets: 261 10*3/uL (ref 150–400)
RBC: 3.92 MIL/uL — ABNORMAL LOW (ref 4.22–5.81)
RDW: 14.3 % (ref 11.5–15.5)
WBC: 9.5 10*3/uL (ref 4.0–10.5)
nRBC: 0 % (ref 0.0–0.2)

## 2019-12-24 LAB — BASIC METABOLIC PANEL
Anion gap: 10 (ref 5–15)
BUN: 11 mg/dL (ref 8–23)
CO2: 24 mmol/L (ref 22–32)
Calcium: 8.5 mg/dL — ABNORMAL LOW (ref 8.9–10.3)
Chloride: 100 mmol/L (ref 98–111)
Creatinine, Ser: 0.84 mg/dL (ref 0.61–1.24)
GFR calc Af Amer: >60 mL/min (ref >60)
GFR calc non Af Amer: >60 mL/min (ref >60)
Glucose, Bld: 122 mg/dL — ABNORMAL HIGH (ref 70–99)
Potassium: 3.8 mmol/L (ref 3.5–5.1)
Sodium: 134 mmol/L — ABNORMAL LOW (ref 135–145)

## 2019-12-24 MED ORDER — ORAL CARE MOUTH RINSE
15.0000 mL | Freq: Two times a day (BID) | OROMUCOSAL | Status: DC
  Administered 2019-12-24 – 2019-12-27 (×7): 15 mL via OROMUCOSAL

## 2019-12-24 MED ORDER — CHLORHEXIDINE GLUCONATE 0.12 % MT SOLN
15.0000 mL | Freq: Two times a day (BID) | OROMUCOSAL | Status: DC
  Administered 2019-12-24 – 2019-12-28 (×9): 15 mL via OROMUCOSAL
  Filled 2019-12-24 (×7): qty 15

## 2019-12-24 MED ORDER — CHLORHEXIDINE GLUCONATE CLOTH 2 % EX PADS
6.0000 | MEDICATED_PAD | Freq: Every day | CUTANEOUS | Status: DC
  Administered 2019-12-24 – 2019-12-27 (×2): 6 via TOPICAL

## 2019-12-24 MED ORDER — CHLORHEXIDINE GLUCONATE CLOTH 2 % EX PADS
6.0000 | MEDICATED_PAD | Freq: Every day | CUTANEOUS | Status: DC
  Administered 2019-12-25 – 2019-12-27 (×3): 6 via TOPICAL

## 2019-12-24 MED ORDER — GUAIFENESIN ER 600 MG PO TB12
600.0000 mg | ORAL_TABLET | Freq: Two times a day (BID) | ORAL | Status: DC
  Administered 2019-12-24 – 2019-12-28 (×8): 600 mg via ORAL
  Filled 2019-12-24 (×9): qty 1

## 2019-12-24 MED ORDER — ALBUTEROL SULFATE (2.5 MG/3ML) 0.083% IN NEBU
INHALATION_SOLUTION | RESPIRATORY_TRACT | Status: AC
  Administered 2019-12-24: 05:00:00 2.5 mg via RESPIRATORY_TRACT
  Filled 2019-12-24: qty 3

## 2019-12-24 MED ORDER — ALBUTEROL SULFATE (2.5 MG/3ML) 0.083% IN NEBU
2.5000 mg | INHALATION_SOLUTION | RESPIRATORY_TRACT | Status: DC | PRN
  Administered 2019-12-25 – 2019-12-27 (×2): 2.5 mg via RESPIRATORY_TRACT
  Filled 2019-12-24 (×3): qty 3

## 2019-12-24 MED ORDER — CHLORHEXIDINE GLUCONATE 0.12 % MT SOLN: 15.0000 mL | Freq: Two times a day (BID) | OROMUCOSAL | Status: DC

## 2019-12-24 MED ORDER — IPRATROPIUM-ALBUTEROL 0.5-2.5 (3) MG/3ML IN SOLN
3.0000 mL | Freq: Three times a day (TID) | RESPIRATORY_TRACT | Status: DC
  Administered 2019-12-24 – 2019-12-26 (×8): 3 mL via RESPIRATORY_TRACT
  Filled 2019-12-24 (×10): qty 3

## 2019-12-24 MED ORDER — TERAZOSIN HCL 5 MG PO CAPS
5.0000 mg | ORAL_CAPSULE | Freq: Every day | ORAL | Status: DC
  Administered 2019-12-24 – 2019-12-27 (×4): 5 mg via ORAL
  Filled 2019-12-24 (×5): qty 1
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
## 2019-12-24 NOTE — Progress Notes (Signed)
Orthopedic Tech Progress Note Patient Details:  [name] [name] [birth year] [id number]  Ortho Devices Type of Ortho Device: Arm sling       Majel Homer 12/24/2019, 6:19 PM
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
## 2019-12-24 NOTE — Progress Notes (Signed)
Pt uneventfully transferred from 4N17 to [place]

## 2019-12-24 NOTE — Progress Notes (Signed)
This note also relates to the following rows which could not be included: Pulse Rate - Cannot attach notes to unvalidated device data Resp - Cannot attach notes to unvalidated device data BP - Cannot attach notes to unvalidated device data SpO2 - Cannot attach notes to unvalidated device data  Pt states he feels like he's not getting enough O2/flow, though SpO2 is 100% and pt does not appear to be in any obvious respiratory distress. Pt requesting to be placed on [place] as well as ATC for comfort/moisture. Pt tolerating at this time. RT Will continue to monitor.
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
## 2019-12-25 ENCOUNTER — Other Ambulatory Visit: Payer: Self-pay | Admitting: *Deleted

## 2019-12-25 ENCOUNTER — Inpatient Hospital Stay (HOSPITAL_COMMUNITY): Payer: No Typology Code available for payment source

## 2019-12-25 LAB — BASIC METABOLIC PANEL
Anion gap: 8 (ref 5–15)
BUN: 10 mg/dL (ref 8–23)
CO2: 26 mmol/L (ref 22–32)
Calcium: 8.4 mg/dL — ABNORMAL LOW (ref 8.9–10.3)
Chloride: 99 mmol/L (ref 98–111)
Creatinine, Ser: 0.85 mg/dL (ref 0.61–1.24)
GFR calc Af Amer: >60 mL/min (ref >60)
GFR calc non Af Amer: >60 mL/min (ref >60)
Glucose, Bld: 117 mg/dL — ABNORMAL HIGH (ref 70–99)
Potassium: 3.8 mmol/L (ref 3.5–5.1)
Sodium: 133 mmol/L — ABNORMAL LOW (ref 135–145)

## 2019-12-25 LAB — CBC WITH DIFFERENTIAL/PLATELET
Abs Immature Granulocytes: 0.04 10*3/uL (ref 0.00–0.07)
Basophils Absolute: 0 10*3/uL (ref 0.0–0.1)
Basophils Relative: 0 %
Eosinophils Absolute: 0.1 10*3/uL (ref 0.0–0.5)
Eosinophils Relative: 1 %
HCT: 34.8 % — ABNORMAL LOW (ref 39.0–52.0)
Hemoglobin: 11.4 g/dL — ABNORMAL LOW (ref 13.0–17.0)
Immature Granulocytes: 0 %
Lymphocytes Relative: 14 %
Lymphs Abs: 1.3 10*3/uL (ref 0.7–4.0)
MCH: 31.1 pg (ref 26.0–34.0)
MCHC: 32.8 g/dL (ref 30.0–36.0)
MCV: 94.8 fL (ref 80.0–100.0)
Monocytes Absolute: 1.1 10*3/uL — ABNORMAL HIGH (ref 0.1–1.0)
Monocytes Relative: 12 %
Neutro Abs: 6.8 10*3/uL (ref 1.7–7.7)
Neutrophils Relative %: 73 %
Platelets: 223 10*3/uL (ref 150–400)
RBC: 3.67 MIL/uL — ABNORMAL LOW (ref 4.22–5.81)
RDW: 14.5 % (ref 11.5–15.5)
WBC: 9.4 10*3/uL (ref 4.0–10.5)
nRBC: 0 % (ref 0.0–0.2)

## 2019-12-25 MED ORDER — TRAMADOL HCL 50 MG PO TABS
50.0000 mg | ORAL_TABLET | Freq: Four times a day (QID) | ORAL | Status: DC
  Administered 2019-12-25 – 2019-12-28 (×13): 50 mg via ORAL
  Filled 2019-12-25 (×13): qty 1

## 2019-12-25 MED ORDER — FINASTERIDE 5 MG PO TABS
5.0000 mg | ORAL_TABLET | Freq: Every day | ORAL | Status: DC
  Administered 2019-12-25 – 2019-12-28 (×4): 5 mg via ORAL
  Filled 2019-12-25 (×4): qty 1

## 2019-12-25 MED ORDER — ACETAMINOPHEN 500 MG PO TABS
1000.0000 mg | ORAL_TABLET | Freq: Four times a day (QID) | ORAL | Status: DC
  Administered 2019-12-25 – 2019-12-28 (×11): 1000 mg via ORAL
  Filled 2019-12-25 (×12): qty 2

## 2019-12-25 NOTE — Progress Notes (Addendum)
Patient arrived to 4N12 v/s and assessment completed. Patient stated he was in pain. Patient was medicated per MAR. Patients speaking valve is at the bedside in a pink cup, patient will use as needed. Patient is also able to clean innercanula of trach as needed.

## 2019-12-25 NOTE — Social Work (Signed)
CSW met with pt at bedside. CSW introduced self and explained her role. CSW completed sbirt with pt. Pt scored a 3 on the sbirt scale. Pt stated that he has 1-2 beers a few nights a week. Pt stated that he does not have a problem with alcohol. Pt denied substance abuse  CSW offered resources to pt. Pt declined resources.  Emeterio Reeve, Latanya Presser, [place] Social Worker [phone]
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
## 2019-12-25 NOTE — Progress Notes (Signed)
Orthopedic Tech Progress Note Patient Details:  [name] [name] 1951/05/22 [id number]  Ortho Devices Type of Ortho Device: Shoulder immobilizer Ortho Device/Splint Location: LUE Ortho Device/Splint Interventions: Ordered, Application   Post Interventions Patient Tolerated: Well Instructions Provided: Care of device   Janit Pagan 12/25/2019, 5:25 PM
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
## 2019-12-26 LAB — COMPREHENSIVE METABOLIC PANEL
ALT: 21 U/L (ref 0–44)
AST: 25 U/L (ref 15–41)
Albumin: 2.6 g/dL — ABNORMAL LOW (ref 3.5–5.0)
Alkaline Phosphatase: 64 U/L (ref 38–126)
Anion gap: 7 (ref 5–15)
BUN: 10 mg/dL (ref 8–23)
CO2: 25 mmol/L (ref 22–32)
Calcium: 8 mg/dL — ABNORMAL LOW (ref 8.9–10.3)
Chloride: 100 mmol/L (ref 98–111)
Creatinine, Ser: 0.73 mg/dL (ref 0.61–1.24)
GFR calc Af Amer: >60 mL/min (ref >60)
GFR calc non Af Amer: >60 mL/min (ref >60)
Glucose, Bld: 102 mg/dL — ABNORMAL HIGH (ref 70–99)
Potassium: 3.8 mmol/L (ref 3.5–5.1)
Sodium: 132 mmol/L — ABNORMAL LOW (ref 135–145)
Total Bilirubin: 1.3 mg/dL — ABNORMAL HIGH (ref 0.3–1.2)
Total Protein: 5.6 g/dL — ABNORMAL LOW (ref 6.5–8.1)

## 2019-12-26 MED ORDER — IPRATROPIUM-ALBUTEROL 0.5-2.5 (3) MG/3ML IN SOLN
3.0000 mL | RESPIRATORY_TRACT | Status: DC | PRN
  Filled 2019-12-26: qty 3

## 2019-12-26 MED ORDER — POLYETHYLENE GLYCOL 3350 17 G PO PACK
17.0000 g | PACK | Freq: Every day | ORAL | Status: DC
  Administered 2019-12-26 – 2019-12-27 (×2): 17 g via ORAL
  Filled 2019-12-26 (×2): qty 1

## 2019-12-26 MED ORDER — IPRATROPIUM-ALBUTEROL 0.5-2.5 (3) MG/3ML IN SOLN
3.0000 mL | Freq: Four times a day (QID) | RESPIRATORY_TRACT | Status: DC
  Administered 2019-12-26 – 2019-12-28 (×8): 3 mL via RESPIRATORY_TRACT
  Filled 2019-12-26 (×9): qty 3
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
## 2019-12-27 ENCOUNTER — Inpatient Hospital Stay (HOSPITAL_COMMUNITY): Payer: No Typology Code available for payment source

## 2019-12-27 LAB — BASIC METABOLIC PANEL
Anion gap: 7 (ref 5–15)
BUN: 11 mg/dL (ref 8–23)
CO2: 26 mmol/L (ref 22–32)
Calcium: 8.3 mg/dL — ABNORMAL LOW (ref 8.9–10.3)
Chloride: 100 mmol/L (ref 98–111)
Creatinine, Ser: 0.71 mg/dL (ref 0.61–1.24)
GFR calc Af Amer: >60 mL/min (ref >60)
GFR calc non Af Amer: >60 mL/min (ref >60)
Glucose, Bld: 109 mg/dL — ABNORMAL HIGH (ref 70–99)
Potassium: 4 mmol/L (ref 3.5–5.1)
Sodium: 133 mmol/L — ABNORMAL LOW (ref 135–145)

## 2019-12-27 LAB — MAGNESIUM: Magnesium: 2.1 mg/dL (ref 1.7–2.4)

## 2019-12-27 LAB — CBC
HCT: 29.4 % — ABNORMAL LOW (ref 39.0–52.0)
Hemoglobin: 9.8 g/dL — ABNORMAL LOW (ref 13.0–17.0)
MCH: 31 pg (ref 26.0–34.0)
MCHC: 33.3 g/dL (ref 30.0–36.0)
MCV: 93 fL (ref 80.0–100.0)
Platelets: 213 10*3/uL (ref 150–400)
RBC: 3.16 MIL/uL — ABNORMAL LOW (ref 4.22–5.81)
RDW: 14.2 % (ref 11.5–15.5)
WBC: 8.5 10*3/uL (ref 4.0–10.5)
nRBC: 0 % (ref 0.0–0.2)

## 2019-12-27 MED ORDER — POLYETHYLENE GLYCOL 3350 17 G PO PACK
17.0000 g | PACK | Freq: Two times a day (BID) | ORAL | Status: DC
  Administered 2019-12-28: 17 g via ORAL
  Filled 2019-12-27: qty 1
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
## 2019-12-28 LAB — CBC
HCT: 28.8 % — ABNORMAL LOW (ref 39.0–52.0)
Hemoglobin: 9.8 g/dL — ABNORMAL LOW (ref 13.0–17.0)
MCH: 31.1 pg (ref 26.0–34.0)
MCHC: 34 g/dL (ref 30.0–36.0)
MCV: 91.4 fL (ref 80.0–100.0)
Platelets: 236 10*3/uL (ref 150–400)
RBC: 3.15 MIL/uL — ABNORMAL LOW (ref 4.22–5.81)
RDW: 14.1 % (ref 11.5–15.5)
WBC: 7.2 10*3/uL (ref 4.0–10.5)
nRBC: 0 % (ref 0.0–0.2)

## 2019-12-28 MED ORDER — POLYETHYLENE GLYCOL 3350 17 G PO PACK: 17.0000 g | PACK | Freq: Every day | ORAL | 0 refills | Status: DC | PRN

## 2019-12-28 MED ORDER — APAP 325 MG PO TABS: 650.0000 mg | ORAL_TABLET | Freq: Four times a day (QID) | ORAL | 0 refills | Status: DC | PRN

## 2019-12-28 MED ORDER — DOCUSATE SODIUM 100 MG PO CAPS: 100.0000 mg | ORAL_CAPSULE | Freq: Two times a day (BID) | ORAL | 0 refills | Status: DC | PRN

## 2019-12-28 MED ORDER — GUAIFENESIN ER 600 MG PO TB12: 600.0000 mg | ORAL_TABLET | Freq: Two times a day (BID) | ORAL | Status: DC | PRN

## 2019-12-28 MED ORDER — OXYCODONE HCL 5 MG PO TABS: 5.0000 mg | ORAL_TABLET | Freq: Four times a day (QID) | ORAL | 0 refills | Status: DC | PRN

## 2019-12-28 MED ORDER — TRAMADOL HCL 50 MG PO TABS: 50.0000 mg | ORAL_TABLET | Freq: Four times a day (QID) | ORAL | 0 refills | Status: DC | PRN
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
## 2019-12-28 NOTE — Progress Notes (Signed)
Discharge instructions reviewed with patient and daughter.  IV removed.  Pt D/C via wheelchair home with daughter.
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
## 2019-12-29 ENCOUNTER — Encounter: Payer: Self-pay | Admitting: *Deleted

## 2019-12-29 ENCOUNTER — Other Ambulatory Visit: Payer: Self-pay

## 2019-12-29 ENCOUNTER — Other Ambulatory Visit: Payer: Self-pay | Admitting: *Deleted

## 2019-12-29 DIAGNOSIS — S42022D Displaced fracture of shaft of left clavicle, subsequent encounter for fracture with routine healing: Secondary | ICD-10-CM | POA: Diagnosis not present

## 2019-12-29 DIAGNOSIS — S42192D Fracture of other part of scapula, left shoulder, subsequent encounter for fracture with routine healing: Secondary | ICD-10-CM | POA: Diagnosis not present

## 2019-12-29 DIAGNOSIS — S270XXD Traumatic pneumothorax, subsequent encounter: Secondary | ICD-10-CM | POA: Diagnosis not present

## 2019-12-29 DIAGNOSIS — J449 Chronic obstructive pulmonary disease, unspecified: Secondary | ICD-10-CM | POA: Diagnosis not present

## 2019-12-29 DIAGNOSIS — Z930 Tracheostomy status: Secondary | ICD-10-CM | POA: Diagnosis not present

## 2019-12-29 DIAGNOSIS — I10 Essential (primary) hypertension: Secondary | ICD-10-CM | POA: Diagnosis not present

## 2019-12-29 DIAGNOSIS — Z7951 Long term (current) use of inhaled steroids: Secondary | ICD-10-CM | POA: Diagnosis not present

## 2019-12-29 DIAGNOSIS — S2242XD Multiple fractures of ribs, left side, subsequent encounter for fracture with routine healing: Secondary | ICD-10-CM | POA: Diagnosis not present

## 2019-12-29 DIAGNOSIS — Z792 Long term (current) use of antibiotics: Secondary | ICD-10-CM | POA: Diagnosis not present

## 2019-12-29 DIAGNOSIS — J4530 Mild persistent asthma, uncomplicated: Secondary | ICD-10-CM | POA: Diagnosis not present

## 2019-12-29 NOTE — Consult Note (Signed)
Patient transitioned back home, assign to [place] team for complex care management services.  For questions, please contact:  Natividad Brood, RN BSN [facility] Liaison  [phone] business mobile phone Toll free office [phone]  Fax number: [phone] [email or web address]
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
## 2020-01-02 DIAGNOSIS — Z930 Tracheostomy status: Secondary | ICD-10-CM | POA: Diagnosis not present

## 2020-01-02 DIAGNOSIS — Z430 Encounter for attention to tracheostomy: Secondary | ICD-10-CM | POA: Diagnosis not present

## 2020-01-02 DIAGNOSIS — J3802 Paralysis of vocal cords and larynx, bilateral: Secondary | ICD-10-CM | POA: Diagnosis not present

## 2020-01-03 DIAGNOSIS — Z7951 Long term (current) use of inhaled steroids: Secondary | ICD-10-CM | POA: Diagnosis not present

## 2020-01-03 DIAGNOSIS — I10 Essential (primary) hypertension: Secondary | ICD-10-CM | POA: Diagnosis not present

## 2020-01-03 DIAGNOSIS — S42022D Displaced fracture of shaft of left clavicle, subsequent encounter for fracture with routine healing: Secondary | ICD-10-CM | POA: Diagnosis not present

## 2020-01-03 DIAGNOSIS — Z792 Long term (current) use of antibiotics: Secondary | ICD-10-CM | POA: Diagnosis not present

## 2020-01-03 DIAGNOSIS — S42192D Fracture of other part of scapula, left shoulder, subsequent encounter for fracture with routine healing: Secondary | ICD-10-CM | POA: Diagnosis not present

## 2020-01-03 DIAGNOSIS — S270XXD Traumatic pneumothorax, subsequent encounter: Secondary | ICD-10-CM | POA: Diagnosis not present

## 2020-01-03 DIAGNOSIS — J4530 Mild persistent asthma, uncomplicated: Secondary | ICD-10-CM | POA: Diagnosis not present

## 2020-01-03 DIAGNOSIS — Z930 Tracheostomy status: Secondary | ICD-10-CM | POA: Diagnosis not present

## 2020-01-03 DIAGNOSIS — J449 Chronic obstructive pulmonary disease, unspecified: Secondary | ICD-10-CM | POA: Diagnosis not present

## 2020-01-03 DIAGNOSIS — S2242XD Multiple fractures of ribs, left side, subsequent encounter for fracture with routine healing: Secondary | ICD-10-CM | POA: Diagnosis not present

## 2020-01-04 DIAGNOSIS — R319 Hematuria, unspecified: Secondary | ICD-10-CM | POA: Diagnosis not present

## 2020-01-04 DIAGNOSIS — K429 Umbilical hernia without obstruction or gangrene: Secondary | ICD-10-CM | POA: Diagnosis not present

## 2020-01-04 DIAGNOSIS — Z8781 Personal history of (healed) traumatic fracture: Secondary | ICD-10-CM | POA: Diagnosis not present

## 2020-01-04 DIAGNOSIS — I10 Essential (primary) hypertension: Secondary | ICD-10-CM | POA: Diagnosis not present

## 2020-01-08 DIAGNOSIS — S42025D Nondisplaced fracture of shaft of left clavicle, subsequent encounter for fracture with routine healing: Secondary | ICD-10-CM | POA: Diagnosis not present

## 2020-01-10 DIAGNOSIS — J449 Chronic obstructive pulmonary disease, unspecified: Secondary | ICD-10-CM | POA: Diagnosis not present

## 2020-01-10 DIAGNOSIS — E785 Hyperlipidemia, unspecified: Secondary | ICD-10-CM | POA: Diagnosis not present

## 2020-01-10 DIAGNOSIS — I10 Essential (primary) hypertension: Secondary | ICD-10-CM | POA: Diagnosis not present

## 2020-01-11 ENCOUNTER — Other Ambulatory Visit: Payer: Self-pay | Admitting: *Deleted
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
## 2020-01-12 DIAGNOSIS — S2242XD Multiple fractures of ribs, left side, subsequent encounter for fracture with routine healing: Secondary | ICD-10-CM | POA: Diagnosis not present

## 2020-01-12 DIAGNOSIS — Z930 Tracheostomy status: Secondary | ICD-10-CM | POA: Diagnosis not present

## 2020-01-12 DIAGNOSIS — S270XXD Traumatic pneumothorax, subsequent encounter: Secondary | ICD-10-CM | POA: Diagnosis not present

## 2020-01-12 DIAGNOSIS — Z792 Long term (current) use of antibiotics: Secondary | ICD-10-CM | POA: Diagnosis not present

## 2020-01-12 DIAGNOSIS — Z7951 Long term (current) use of inhaled steroids: Secondary | ICD-10-CM | POA: Diagnosis not present

## 2020-01-12 DIAGNOSIS — S42192D Fracture of other part of scapula, left shoulder, subsequent encounter for fracture with routine healing: Secondary | ICD-10-CM | POA: Diagnosis not present

## 2020-01-12 DIAGNOSIS — S42022D Displaced fracture of shaft of left clavicle, subsequent encounter for fracture with routine healing: Secondary | ICD-10-CM | POA: Diagnosis not present

## 2020-01-12 DIAGNOSIS — J4530 Mild persistent asthma, uncomplicated: Secondary | ICD-10-CM | POA: Diagnosis not present

## 2020-01-12 DIAGNOSIS — J449 Chronic obstructive pulmonary disease, unspecified: Secondary | ICD-10-CM | POA: Diagnosis not present

## 2020-01-12 DIAGNOSIS — I10 Essential (primary) hypertension: Secondary | ICD-10-CM | POA: Diagnosis not present

## 2020-01-15 DIAGNOSIS — R3121 Asymptomatic microscopic hematuria: Secondary | ICD-10-CM | POA: Diagnosis not present

## 2020-01-15 DIAGNOSIS — Z430 Encounter for attention to tracheostomy: Secondary | ICD-10-CM | POA: Diagnosis not present

## 2020-01-15 DIAGNOSIS — N323 Diverticulum of bladder: Secondary | ICD-10-CM | POA: Diagnosis not present

## 2020-01-15 DIAGNOSIS — K429 Umbilical hernia without obstruction or gangrene: Secondary | ICD-10-CM | POA: Diagnosis not present

## 2020-01-16 DIAGNOSIS — R52 Pain, unspecified: Secondary | ICD-10-CM | POA: Diagnosis not present

## 2020-01-16 DIAGNOSIS — M898X1 Other specified disorders of bone, shoulder: Secondary | ICD-10-CM | POA: Diagnosis not present

## 2020-01-16 DIAGNOSIS — I10 Essential (primary) hypertension: Secondary | ICD-10-CM | POA: Diagnosis not present

## 2020-01-16 DIAGNOSIS — S2249XS Multiple fractures of ribs, unspecified side, sequela: Secondary | ICD-10-CM | POA: Diagnosis not present

## 2020-01-16 DIAGNOSIS — M62838 Other muscle spasm: Secondary | ICD-10-CM | POA: Diagnosis not present

## 2020-01-17 DIAGNOSIS — S270XXD Traumatic pneumothorax, subsequent encounter: Secondary | ICD-10-CM | POA: Diagnosis not present

## 2020-01-17 DIAGNOSIS — Z792 Long term (current) use of antibiotics: Secondary | ICD-10-CM | POA: Diagnosis not present

## 2020-01-17 DIAGNOSIS — I10 Essential (primary) hypertension: Secondary | ICD-10-CM | POA: Diagnosis not present

## 2020-01-17 DIAGNOSIS — Z7951 Long term (current) use of inhaled steroids: Secondary | ICD-10-CM | POA: Diagnosis not present

## 2020-01-17 DIAGNOSIS — J4530 Mild persistent asthma, uncomplicated: Secondary | ICD-10-CM | POA: Diagnosis not present

## 2020-01-17 DIAGNOSIS — J449 Chronic obstructive pulmonary disease, unspecified: Secondary | ICD-10-CM | POA: Diagnosis not present

## 2020-01-17 DIAGNOSIS — S42192D Fracture of other part of scapula, left shoulder, subsequent encounter for fracture with routine healing: Secondary | ICD-10-CM | POA: Diagnosis not present

## 2020-01-17 DIAGNOSIS — S2242XD Multiple fractures of ribs, left side, subsequent encounter for fracture with routine healing: Secondary | ICD-10-CM | POA: Diagnosis not present

## 2020-01-17 DIAGNOSIS — S42022D Displaced fracture of shaft of left clavicle, subsequent encounter for fracture with routine healing: Secondary | ICD-10-CM | POA: Diagnosis not present

## 2020-01-17 DIAGNOSIS — Z930 Tracheostomy status: Secondary | ICD-10-CM | POA: Diagnosis not present

## 2020-01-18 ENCOUNTER — Other Ambulatory Visit: Payer: Self-pay | Admitting: *Deleted

## 2020-01-18 NOTE — Patient Outreach (Signed)
[place] [facility]) Care Management  01/18/2020  [name] [name] 1951-09-12 [id number]   Call placed to member to follow up on home visits by [facility]  No answer, HIPAA compliant voice message left.  Will follow up within the next 3-4 business days.    Update:  Call received back from member, confirms that PT has restarted.  Denies any concerns at this time, focusing on gaining strength.  Will follow up within the next 3 weeks   Valente David, Therapist, sports, MSN [place] Manager [phone]

## 2020-01-19 DIAGNOSIS — J449 Chronic obstructive pulmonary disease, unspecified: Secondary | ICD-10-CM | POA: Diagnosis not present

## 2020-01-19 DIAGNOSIS — Z792 Long term (current) use of antibiotics: Secondary | ICD-10-CM | POA: Diagnosis not present

## 2020-01-19 DIAGNOSIS — Z7951 Long term (current) use of inhaled steroids: Secondary | ICD-10-CM | POA: Diagnosis not present

## 2020-01-19 DIAGNOSIS — S42022D Displaced fracture of shaft of left clavicle, subsequent encounter for fracture with routine healing: Secondary | ICD-10-CM | POA: Diagnosis not present

## 2020-01-19 DIAGNOSIS — I10 Essential (primary) hypertension: Secondary | ICD-10-CM | POA: Diagnosis not present

## 2020-01-19 DIAGNOSIS — S270XXD Traumatic pneumothorax, subsequent encounter: Secondary | ICD-10-CM | POA: Diagnosis not present

## 2020-01-19 DIAGNOSIS — S42192D Fracture of other part of scapula, left shoulder, subsequent encounter for fracture with routine healing: Secondary | ICD-10-CM | POA: Diagnosis not present

## 2020-01-19 DIAGNOSIS — Z930 Tracheostomy status: Secondary | ICD-10-CM | POA: Diagnosis not present

## 2020-01-19 DIAGNOSIS — S2242XD Multiple fractures of ribs, left side, subsequent encounter for fracture with routine healing: Secondary | ICD-10-CM | POA: Diagnosis not present

## 2020-01-19 DIAGNOSIS — J4530 Mild persistent asthma, uncomplicated: Secondary | ICD-10-CM | POA: Diagnosis not present

## 2020-01-23 ENCOUNTER — Ambulatory Visit: Payer: No Typology Code available for payment source | Admitting: *Deleted

## 2020-01-23 DIAGNOSIS — I10 Essential (primary) hypertension: Secondary | ICD-10-CM | POA: Diagnosis not present

## 2020-01-23 DIAGNOSIS — Z7951 Long term (current) use of inhaled steroids: Secondary | ICD-10-CM | POA: Diagnosis not present

## 2020-01-23 DIAGNOSIS — J449 Chronic obstructive pulmonary disease, unspecified: Secondary | ICD-10-CM | POA: Diagnosis not present

## 2020-01-23 DIAGNOSIS — S42192D Fracture of other part of scapula, left shoulder, subsequent encounter for fracture with routine healing: Secondary | ICD-10-CM | POA: Diagnosis not present

## 2020-01-23 DIAGNOSIS — S270XXD Traumatic pneumothorax, subsequent encounter: Secondary | ICD-10-CM | POA: Diagnosis not present

## 2020-01-23 DIAGNOSIS — J4530 Mild persistent asthma, uncomplicated: Secondary | ICD-10-CM | POA: Diagnosis not present

## 2020-01-23 DIAGNOSIS — S42022D Displaced fracture of shaft of left clavicle, subsequent encounter for fracture with routine healing: Secondary | ICD-10-CM | POA: Diagnosis not present

## 2020-01-23 DIAGNOSIS — S2242XD Multiple fractures of ribs, left side, subsequent encounter for fracture with routine healing: Secondary | ICD-10-CM | POA: Diagnosis not present

## 2020-01-23 DIAGNOSIS — Z792 Long term (current) use of antibiotics: Secondary | ICD-10-CM | POA: Diagnosis not present

## 2020-01-23 DIAGNOSIS — Z930 Tracheostomy status: Secondary | ICD-10-CM | POA: Diagnosis not present

## 2020-01-26 DIAGNOSIS — Z792 Long term (current) use of antibiotics: Secondary | ICD-10-CM | POA: Diagnosis not present

## 2020-01-26 DIAGNOSIS — J449 Chronic obstructive pulmonary disease, unspecified: Secondary | ICD-10-CM | POA: Diagnosis not present

## 2020-01-26 DIAGNOSIS — Z930 Tracheostomy status: Secondary | ICD-10-CM | POA: Diagnosis not present

## 2020-01-26 DIAGNOSIS — S270XXD Traumatic pneumothorax, subsequent encounter: Secondary | ICD-10-CM | POA: Diagnosis not present

## 2020-01-26 DIAGNOSIS — Z7951 Long term (current) use of inhaled steroids: Secondary | ICD-10-CM | POA: Diagnosis not present

## 2020-01-26 DIAGNOSIS — J4530 Mild persistent asthma, uncomplicated: Secondary | ICD-10-CM | POA: Diagnosis not present

## 2020-01-26 DIAGNOSIS — S42192D Fracture of other part of scapula, left shoulder, subsequent encounter for fracture with routine healing: Secondary | ICD-10-CM | POA: Diagnosis not present

## 2020-01-26 DIAGNOSIS — S42022D Displaced fracture of shaft of left clavicle, subsequent encounter for fracture with routine healing: Secondary | ICD-10-CM | POA: Diagnosis not present

## 2020-01-26 DIAGNOSIS — S2242XD Multiple fractures of ribs, left side, subsequent encounter for fracture with routine healing: Secondary | ICD-10-CM | POA: Diagnosis not present

## 2020-01-26 DIAGNOSIS — I10 Essential (primary) hypertension: Secondary | ICD-10-CM | POA: Diagnosis not present

## 2020-01-29 DIAGNOSIS — S2242XD Multiple fractures of ribs, left side, subsequent encounter for fracture with routine healing: Secondary | ICD-10-CM | POA: Diagnosis not present

## 2020-01-29 DIAGNOSIS — S42192D Fracture of other part of scapula, left shoulder, subsequent encounter for fracture with routine healing: Secondary | ICD-10-CM | POA: Diagnosis not present

## 2020-01-29 DIAGNOSIS — S42022D Displaced fracture of shaft of left clavicle, subsequent encounter for fracture with routine healing: Secondary | ICD-10-CM | POA: Diagnosis not present

## 2020-01-29 DIAGNOSIS — S279XXD Injury of unspecified intrathoracic organ, subsequent encounter: Secondary | ICD-10-CM | POA: Diagnosis not present

## 2020-01-30 IMAGING — DX DG CHEST 2V
2 series · 2 of 2 positions shown · non-contrast
Comparison: 03/06/2018

CLINICAL DATA: Shortness of breath for 4-5 days, spur pink sputum
from tracheostomy, diaphoresis, warm at presentation, diminished in
wheezing lung sounds, history asthma, COPD, hypertension

EXAM:
CHEST - 2 VIEW

[chest pa]
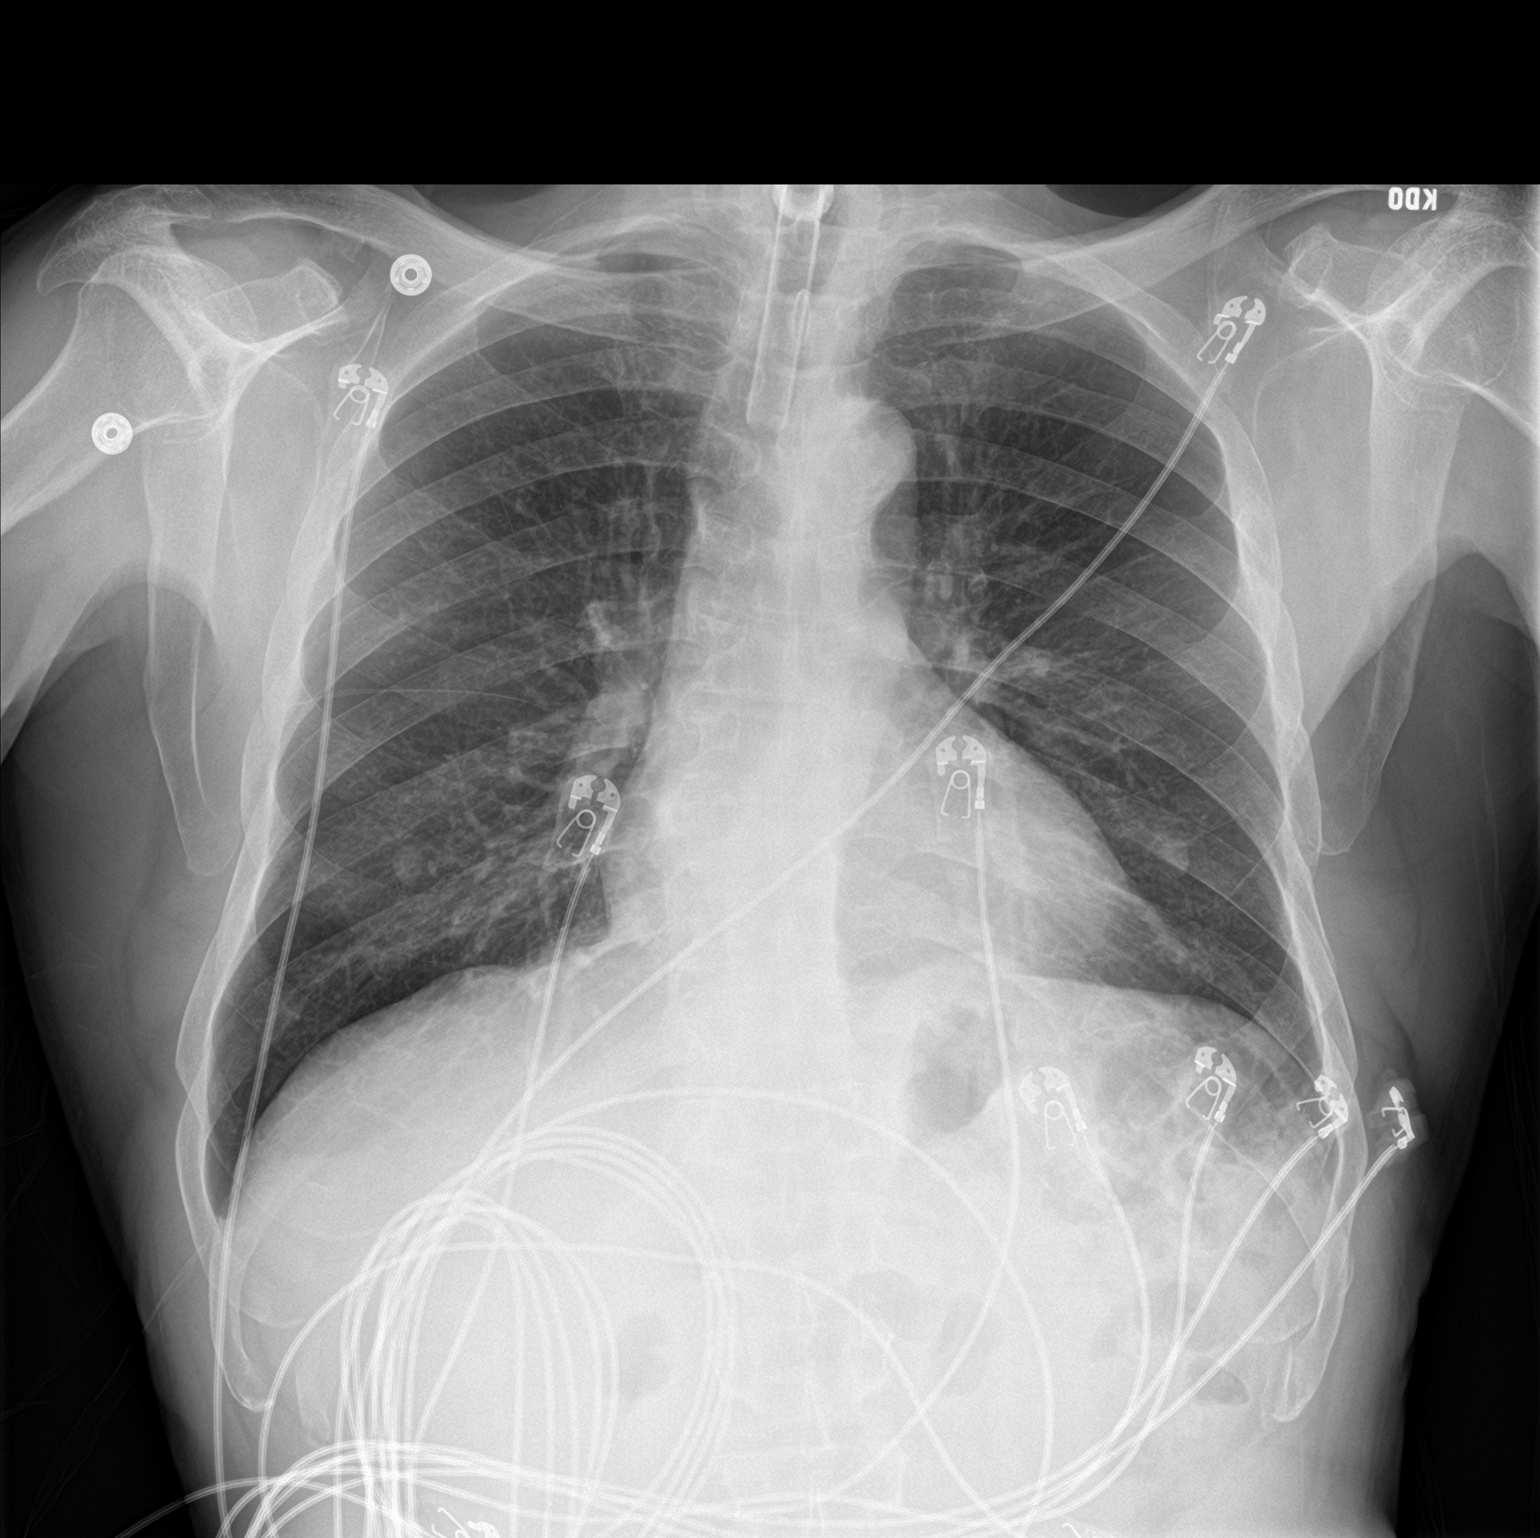

[chest lat]
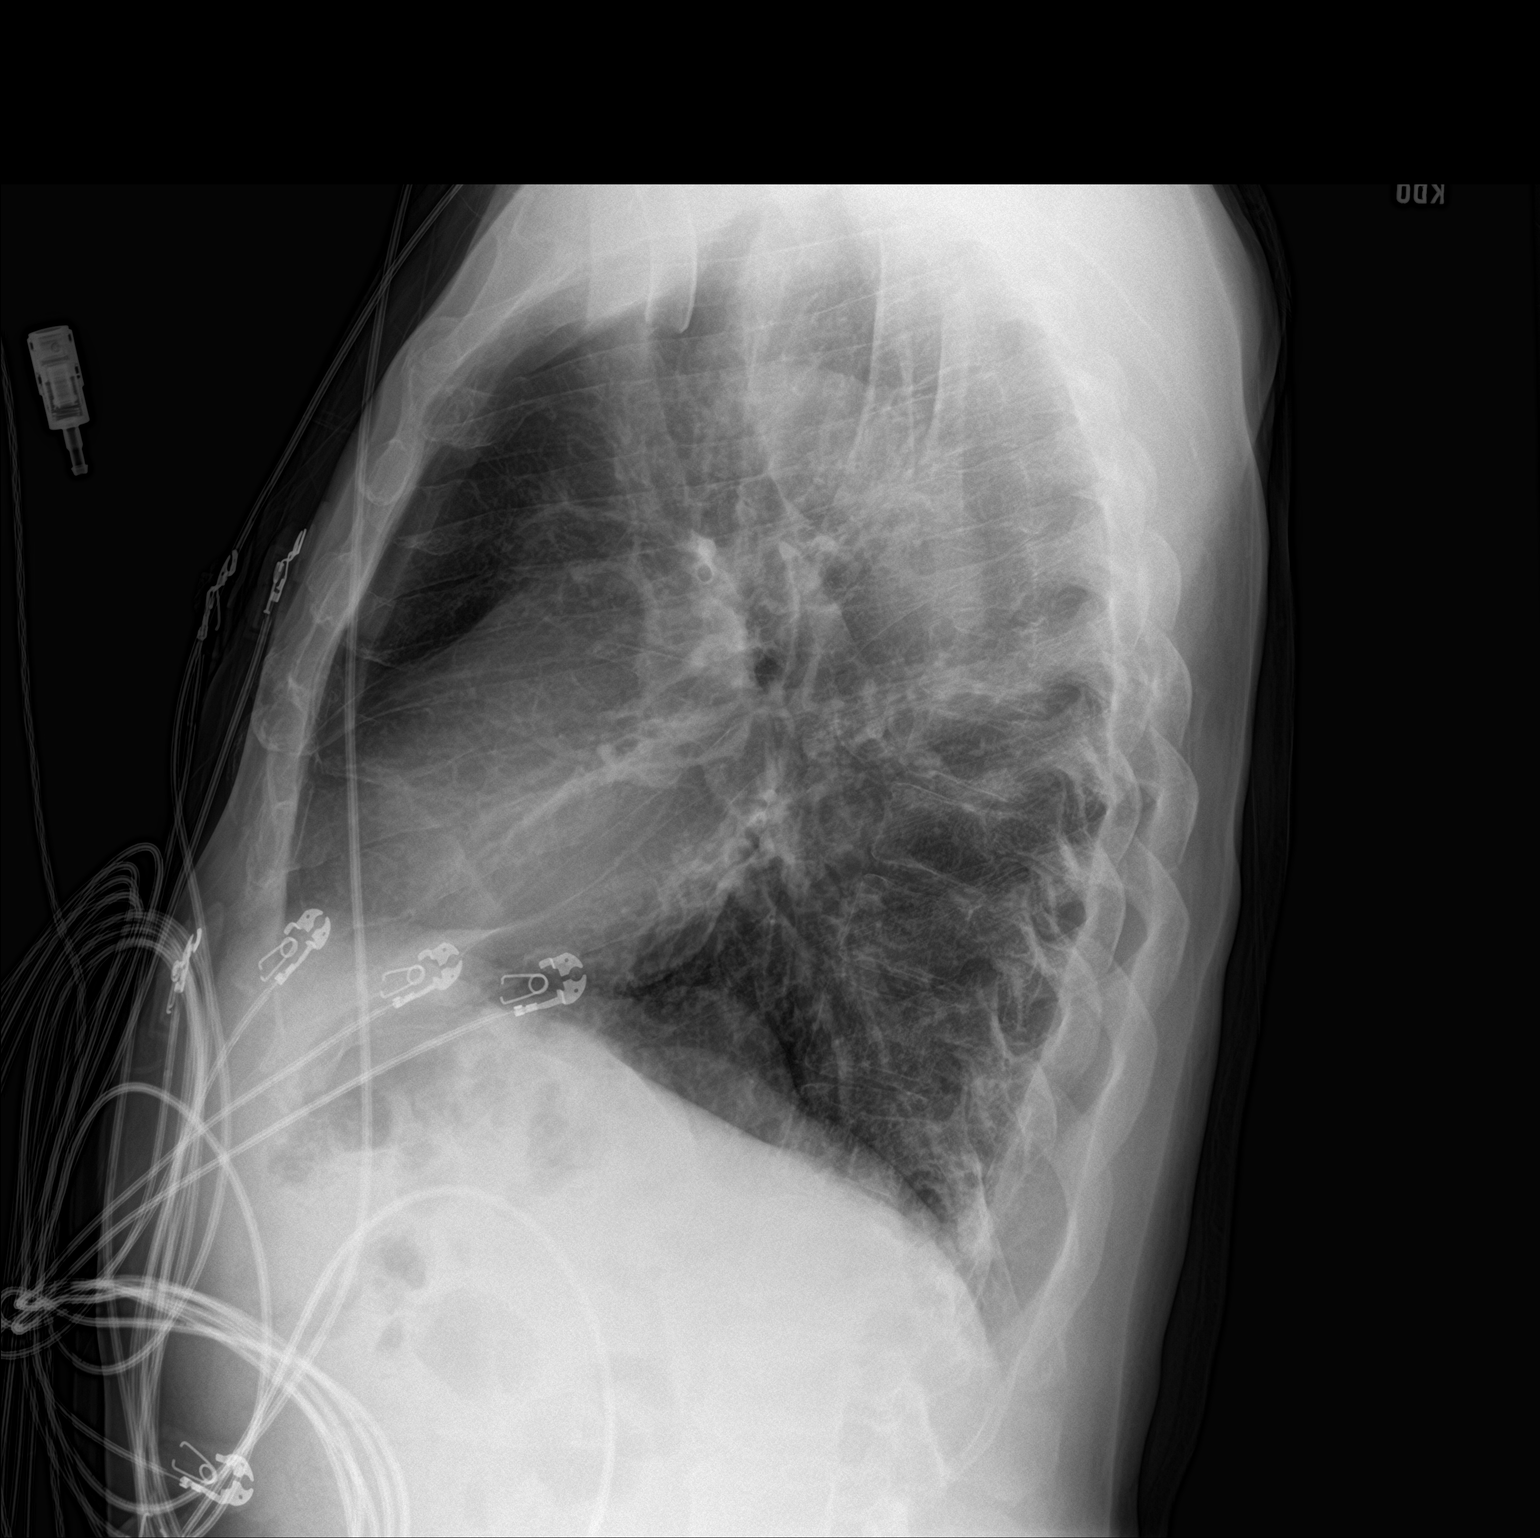

[2 of 2 positions shown; findings below may reference images not displayed]

FINDINGS: Tracheostomy tube projects over tracheal air column.

Normal heart size, mediastinal contours, and pulmonary vascularity.

Probable BILATERAL nipple shadows, unchanged since 01/10/2018.
Bronchitic changes and mild hyperinflation consistent with COPD.

No acute infiltrate, pleural effusion, or pneumothorax.

Bones unremarkable.
IMPRESSION: COPD changes without acute infiltrate.

## 2020-01-31 DIAGNOSIS — Z792 Long term (current) use of antibiotics: Secondary | ICD-10-CM | POA: Diagnosis not present

## 2020-01-31 DIAGNOSIS — S2242XD Multiple fractures of ribs, left side, subsequent encounter for fracture with routine healing: Secondary | ICD-10-CM | POA: Diagnosis not present

## 2020-01-31 DIAGNOSIS — S42192D Fracture of other part of scapula, left shoulder, subsequent encounter for fracture with routine healing: Secondary | ICD-10-CM | POA: Diagnosis not present

## 2020-01-31 DIAGNOSIS — S270XXD Traumatic pneumothorax, subsequent encounter: Secondary | ICD-10-CM | POA: Diagnosis not present

## 2020-01-31 DIAGNOSIS — S42022D Displaced fracture of shaft of left clavicle, subsequent encounter for fracture with routine healing: Secondary | ICD-10-CM | POA: Diagnosis not present

## 2020-01-31 DIAGNOSIS — J4530 Mild persistent asthma, uncomplicated: Secondary | ICD-10-CM | POA: Diagnosis not present

## 2020-01-31 DIAGNOSIS — Z930 Tracheostomy status: Secondary | ICD-10-CM | POA: Diagnosis not present

## 2020-01-31 DIAGNOSIS — J449 Chronic obstructive pulmonary disease, unspecified: Secondary | ICD-10-CM | POA: Diagnosis not present

## 2020-01-31 DIAGNOSIS — Z7951 Long term (current) use of inhaled steroids: Secondary | ICD-10-CM | POA: Diagnosis not present

## 2020-01-31 DIAGNOSIS — I10 Essential (primary) hypertension: Secondary | ICD-10-CM | POA: Diagnosis not present

## 2020-02-01 IMAGING — DX DG CHEST 2V
2 series · 2 of 2 positions shown · non-contrast
Comparison: Radiographs April 17, 2018.

CLINICAL DATA: Shortness of breath.

EXAM:
CHEST - 2 VIEW

[chest pa]
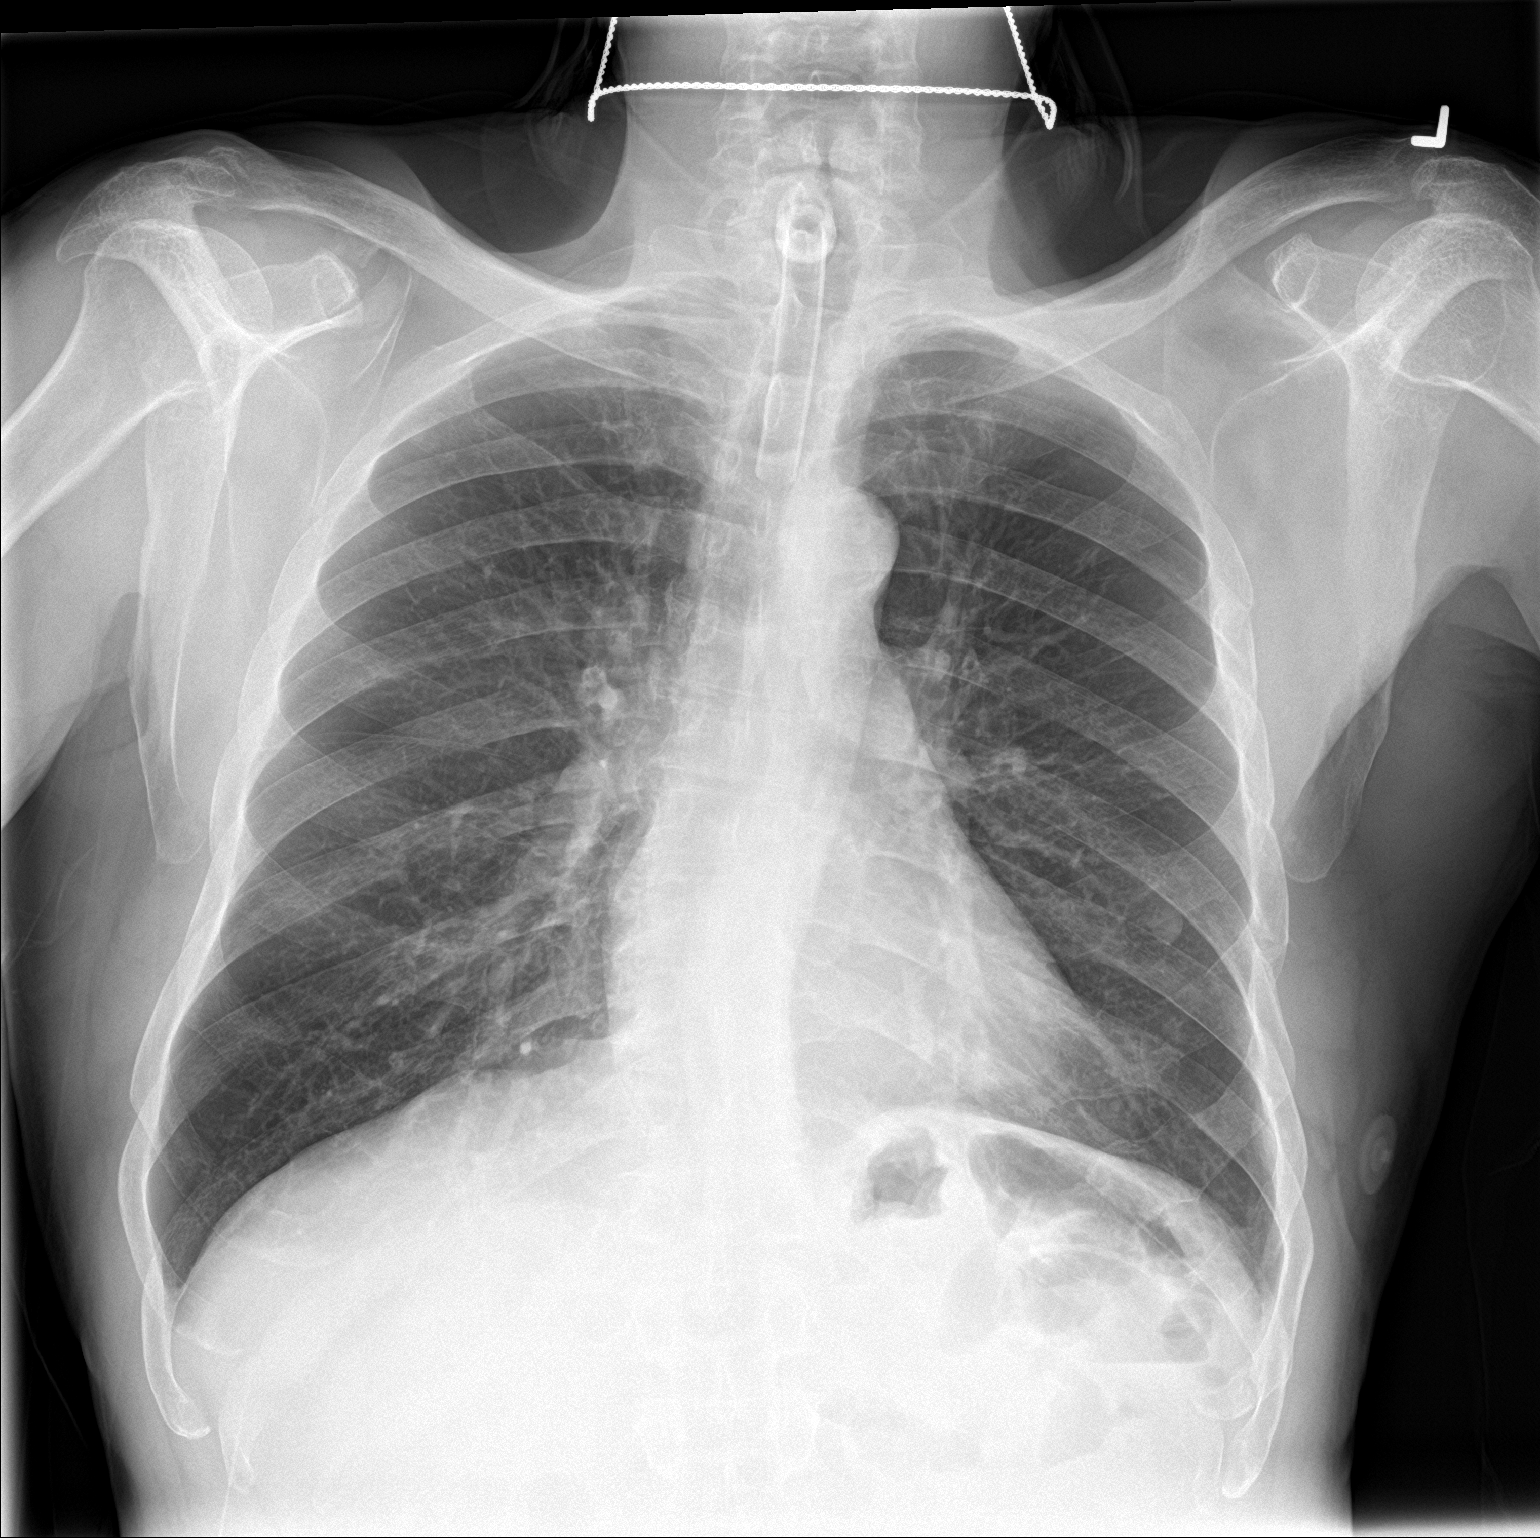

[chest lat]
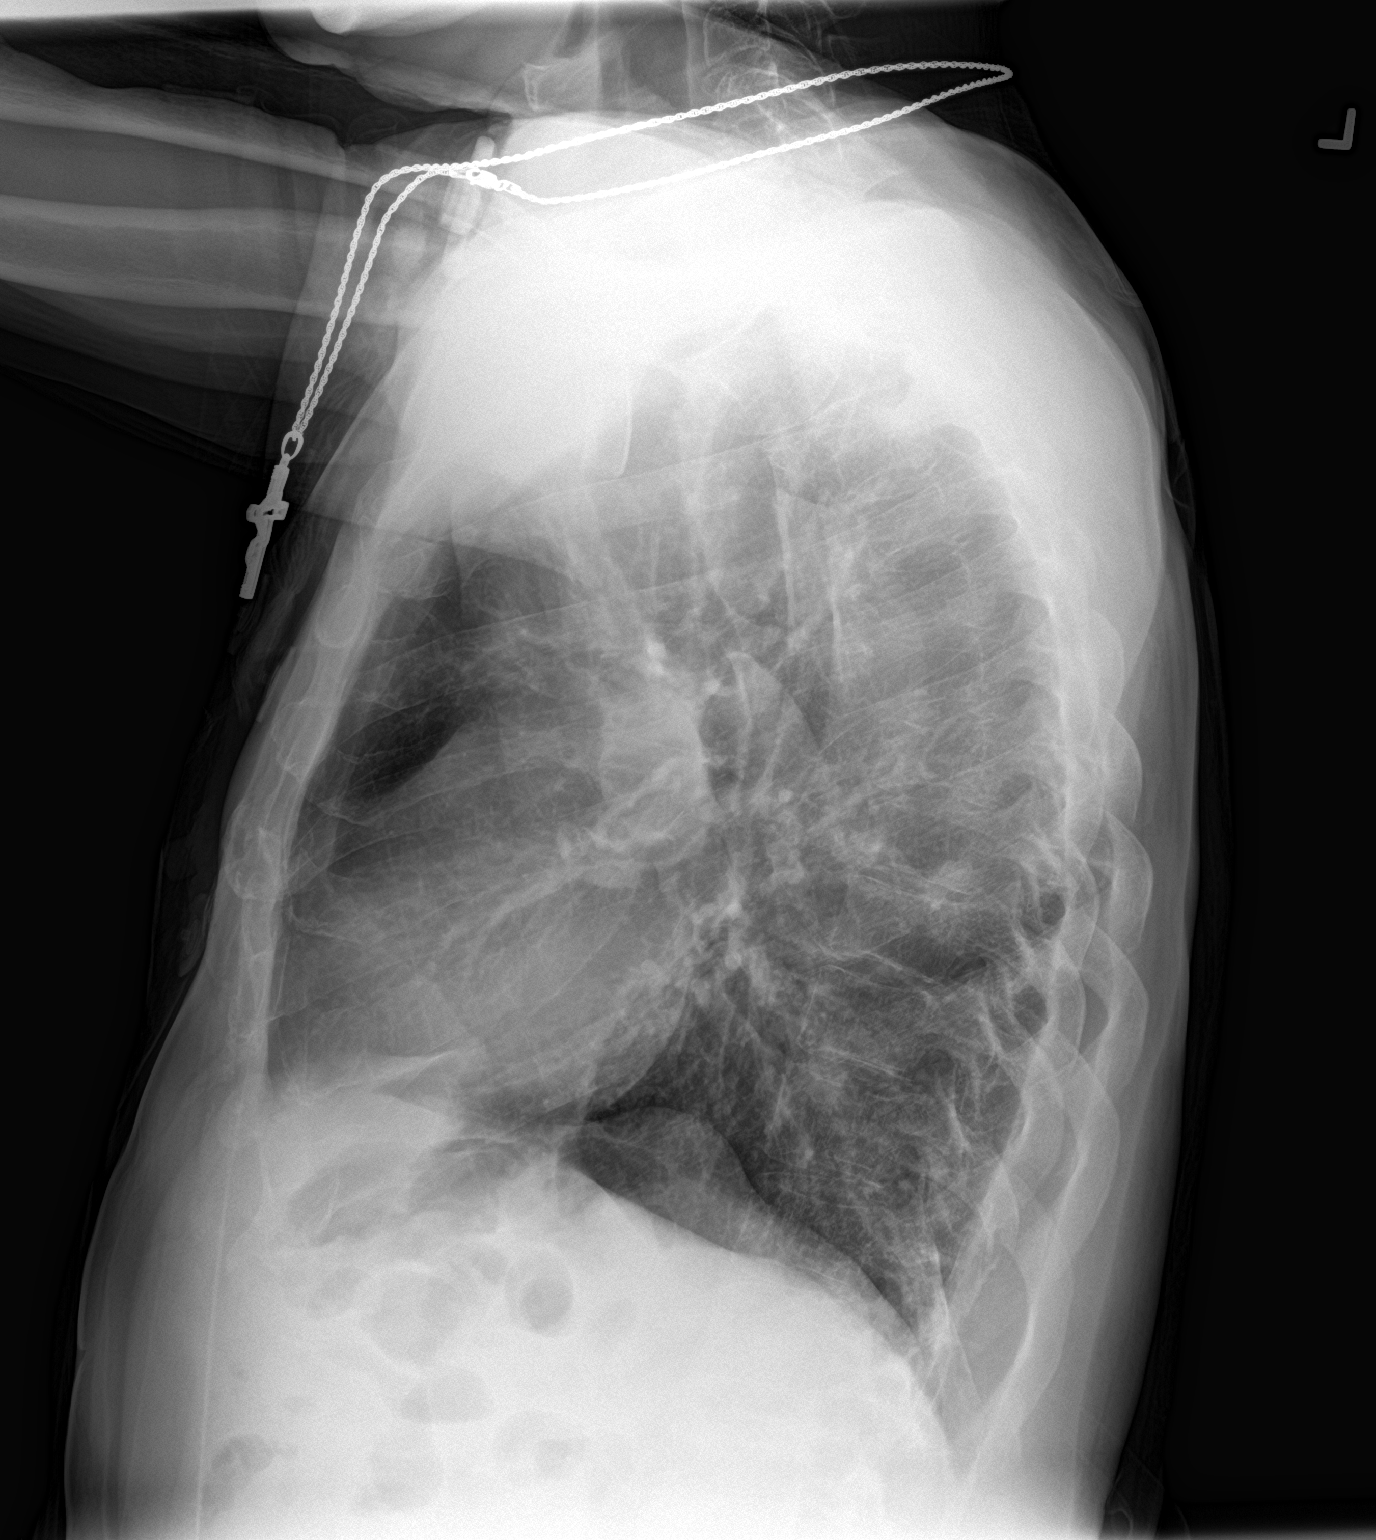

[2 of 2 positions shown; findings below may reference images not displayed]

FINDINGS: The heart size and mediastinal contours are within normal limits.
Both lungs are clear. No pneumothorax or pleural effusion is noted.
Tracheostomy tube is in grossly good position. The visualized
skeletal structures are unremarkable.
IMPRESSION: No active cardiopulmonary disease.

## 2020-02-07 DIAGNOSIS — S42022D Displaced fracture of shaft of left clavicle, subsequent encounter for fracture with routine healing: Secondary | ICD-10-CM | POA: Diagnosis not present

## 2020-02-07 DIAGNOSIS — Z792 Long term (current) use of antibiotics: Secondary | ICD-10-CM | POA: Diagnosis not present

## 2020-02-07 DIAGNOSIS — S270XXD Traumatic pneumothorax, subsequent encounter: Secondary | ICD-10-CM | POA: Diagnosis not present

## 2020-02-07 DIAGNOSIS — J449 Chronic obstructive pulmonary disease, unspecified: Secondary | ICD-10-CM | POA: Diagnosis not present

## 2020-02-07 DIAGNOSIS — Z930 Tracheostomy status: Secondary | ICD-10-CM | POA: Diagnosis not present

## 2020-02-07 DIAGNOSIS — I10 Essential (primary) hypertension: Secondary | ICD-10-CM | POA: Diagnosis not present

## 2020-02-07 DIAGNOSIS — Z7951 Long term (current) use of inhaled steroids: Secondary | ICD-10-CM | POA: Diagnosis not present

## 2020-02-07 DIAGNOSIS — S2242XD Multiple fractures of ribs, left side, subsequent encounter for fracture with routine healing: Secondary | ICD-10-CM | POA: Diagnosis not present

## 2020-02-07 DIAGNOSIS — S42192D Fracture of other part of scapula, left shoulder, subsequent encounter for fracture with routine healing: Secondary | ICD-10-CM | POA: Diagnosis not present

## 2020-02-07 DIAGNOSIS — J4530 Mild persistent asthma, uncomplicated: Secondary | ICD-10-CM | POA: Diagnosis not present

## 2020-02-09 ENCOUNTER — Other Ambulatory Visit: Payer: Self-pay | Admitting: *Deleted

## 2020-02-09 DIAGNOSIS — E785 Hyperlipidemia, unspecified: Secondary | ICD-10-CM | POA: Diagnosis not present

## 2020-02-09 DIAGNOSIS — S270XXD Traumatic pneumothorax, subsequent encounter: Secondary | ICD-10-CM | POA: Diagnosis not present

## 2020-02-09 DIAGNOSIS — Z792 Long term (current) use of antibiotics: Secondary | ICD-10-CM | POA: Diagnosis not present

## 2020-02-09 DIAGNOSIS — J4530 Mild persistent asthma, uncomplicated: Secondary | ICD-10-CM | POA: Diagnosis not present

## 2020-02-09 DIAGNOSIS — S2242XD Multiple fractures of ribs, left side, subsequent encounter for fracture with routine healing: Secondary | ICD-10-CM | POA: Diagnosis not present

## 2020-02-09 DIAGNOSIS — S42192D Fracture of other part of scapula, left shoulder, subsequent encounter for fracture with routine healing: Secondary | ICD-10-CM | POA: Diagnosis not present

## 2020-02-09 DIAGNOSIS — Z7951 Long term (current) use of inhaled steroids: Secondary | ICD-10-CM | POA: Diagnosis not present

## 2020-02-09 DIAGNOSIS — Z930 Tracheostomy status: Secondary | ICD-10-CM | POA: Diagnosis not present

## 2020-02-09 DIAGNOSIS — S42022D Displaced fracture of shaft of left clavicle, subsequent encounter for fracture with routine healing: Secondary | ICD-10-CM | POA: Diagnosis not present

## 2020-02-09 DIAGNOSIS — I10 Essential (primary) hypertension: Secondary | ICD-10-CM | POA: Diagnosis not present

## 2020-02-09 DIAGNOSIS — J449 Chronic obstructive pulmonary disease, unspecified: Secondary | ICD-10-CM | POA: Diagnosis not present
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
## 2020-02-13 DIAGNOSIS — R0600 Dyspnea, unspecified: Secondary | ICD-10-CM | POA: Diagnosis not present

## 2020-02-13 DIAGNOSIS — J386 Stenosis of larynx: Secondary | ICD-10-CM | POA: Diagnosis not present

## 2020-02-13 DIAGNOSIS — Z930 Tracheostomy status: Secondary | ICD-10-CM | POA: Diagnosis not present

## 2020-02-13 DIAGNOSIS — Z430 Encounter for attention to tracheostomy: Secondary | ICD-10-CM | POA: Diagnosis not present

## 2020-02-15 DIAGNOSIS — S42192D Fracture of other part of scapula, left shoulder, subsequent encounter for fracture with routine healing: Secondary | ICD-10-CM | POA: Diagnosis not present

## 2020-02-15 DIAGNOSIS — I10 Essential (primary) hypertension: Secondary | ICD-10-CM | POA: Diagnosis not present

## 2020-02-15 DIAGNOSIS — Z7951 Long term (current) use of inhaled steroids: Secondary | ICD-10-CM | POA: Diagnosis not present

## 2020-02-15 DIAGNOSIS — J449 Chronic obstructive pulmonary disease, unspecified: Secondary | ICD-10-CM | POA: Diagnosis not present

## 2020-02-15 DIAGNOSIS — Z930 Tracheostomy status: Secondary | ICD-10-CM | POA: Diagnosis not present

## 2020-02-15 DIAGNOSIS — Z792 Long term (current) use of antibiotics: Secondary | ICD-10-CM | POA: Diagnosis not present

## 2020-02-15 DIAGNOSIS — J4530 Mild persistent asthma, uncomplicated: Secondary | ICD-10-CM | POA: Diagnosis not present

## 2020-02-15 DIAGNOSIS — S2242XD Multiple fractures of ribs, left side, subsequent encounter for fracture with routine healing: Secondary | ICD-10-CM | POA: Diagnosis not present

## 2020-02-15 DIAGNOSIS — S270XXD Traumatic pneumothorax, subsequent encounter: Secondary | ICD-10-CM | POA: Diagnosis not present

## 2020-02-15 DIAGNOSIS — S42022D Displaced fracture of shaft of left clavicle, subsequent encounter for fracture with routine healing: Secondary | ICD-10-CM | POA: Diagnosis not present

## 2020-02-16 DIAGNOSIS — S42025D Nondisplaced fracture of shaft of left clavicle, subsequent encounter for fracture with routine healing: Secondary | ICD-10-CM | POA: Diagnosis not present

## 2020-02-16 DIAGNOSIS — Z430 Encounter for attention to tracheostomy: Secondary | ICD-10-CM | POA: Diagnosis not present

## 2020-02-17 IMAGING — DX DG CHEST 1V PORT
1 series · 1 of 1 positions shown · non-contrast
Comparison: Radiographs 04/19/2018

CLINICAL DATA: Shortness of breath. Bloody sputum from
tracheostomy.

EXAM:
PORTABLE CHEST 1 VIEW

[chest ap]
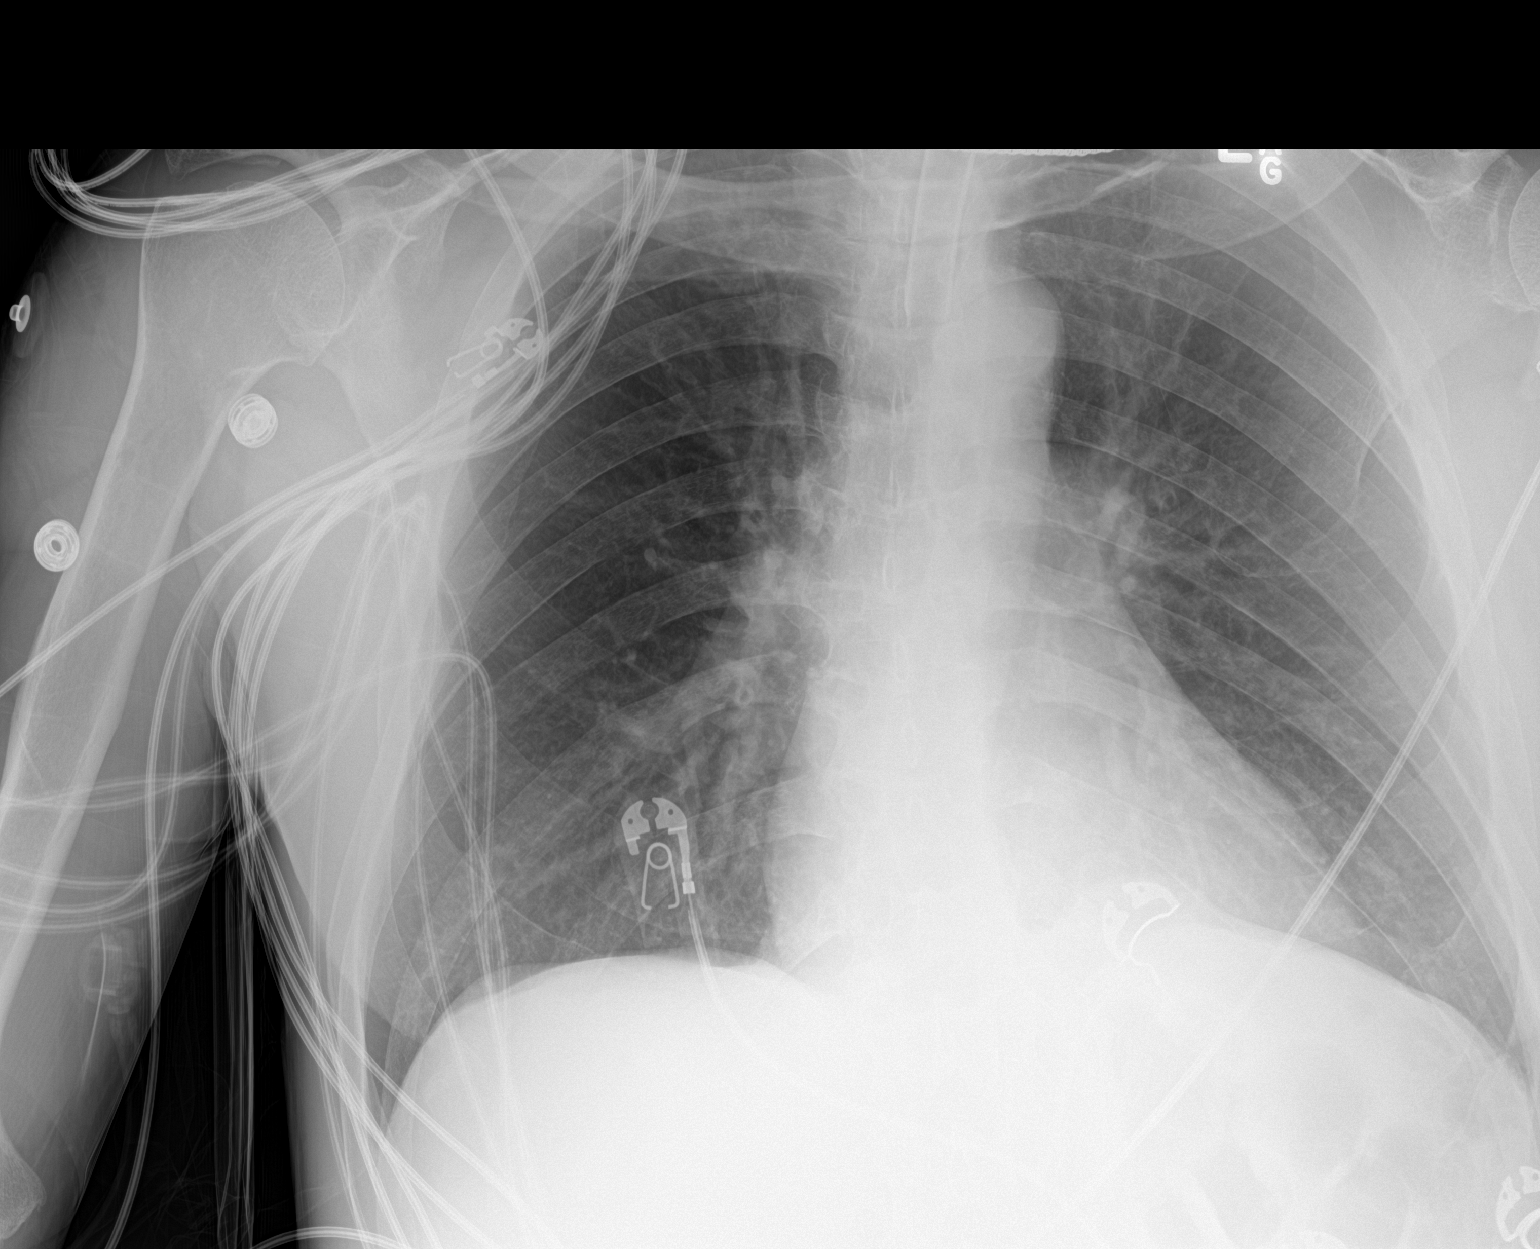

[1 of 1 positions shown; findings below may reference images not displayed]

FINDINGS: Tracheostomy tube at the thoracic inlet. Unchanged heart size and
mediastinal contours. Minimal left lung base scarring. No focal
airspace disease, pleural effusion or pneumothorax. No acute osseous
abnormalities.
IMPRESSION: Tracheostomy tube at the thoracic inlet. Mild left lung base
scarring.

## 2020-02-18 IMAGING — DX DG CHEST 1V PORT
1 series · 1 of 1 positions shown · non-contrast
Comparison: 05/05/2018.

CLINICAL DATA: Respiratory failure.

EXAM:
PORTABLE CHEST 1 VIEW

[chest ap]
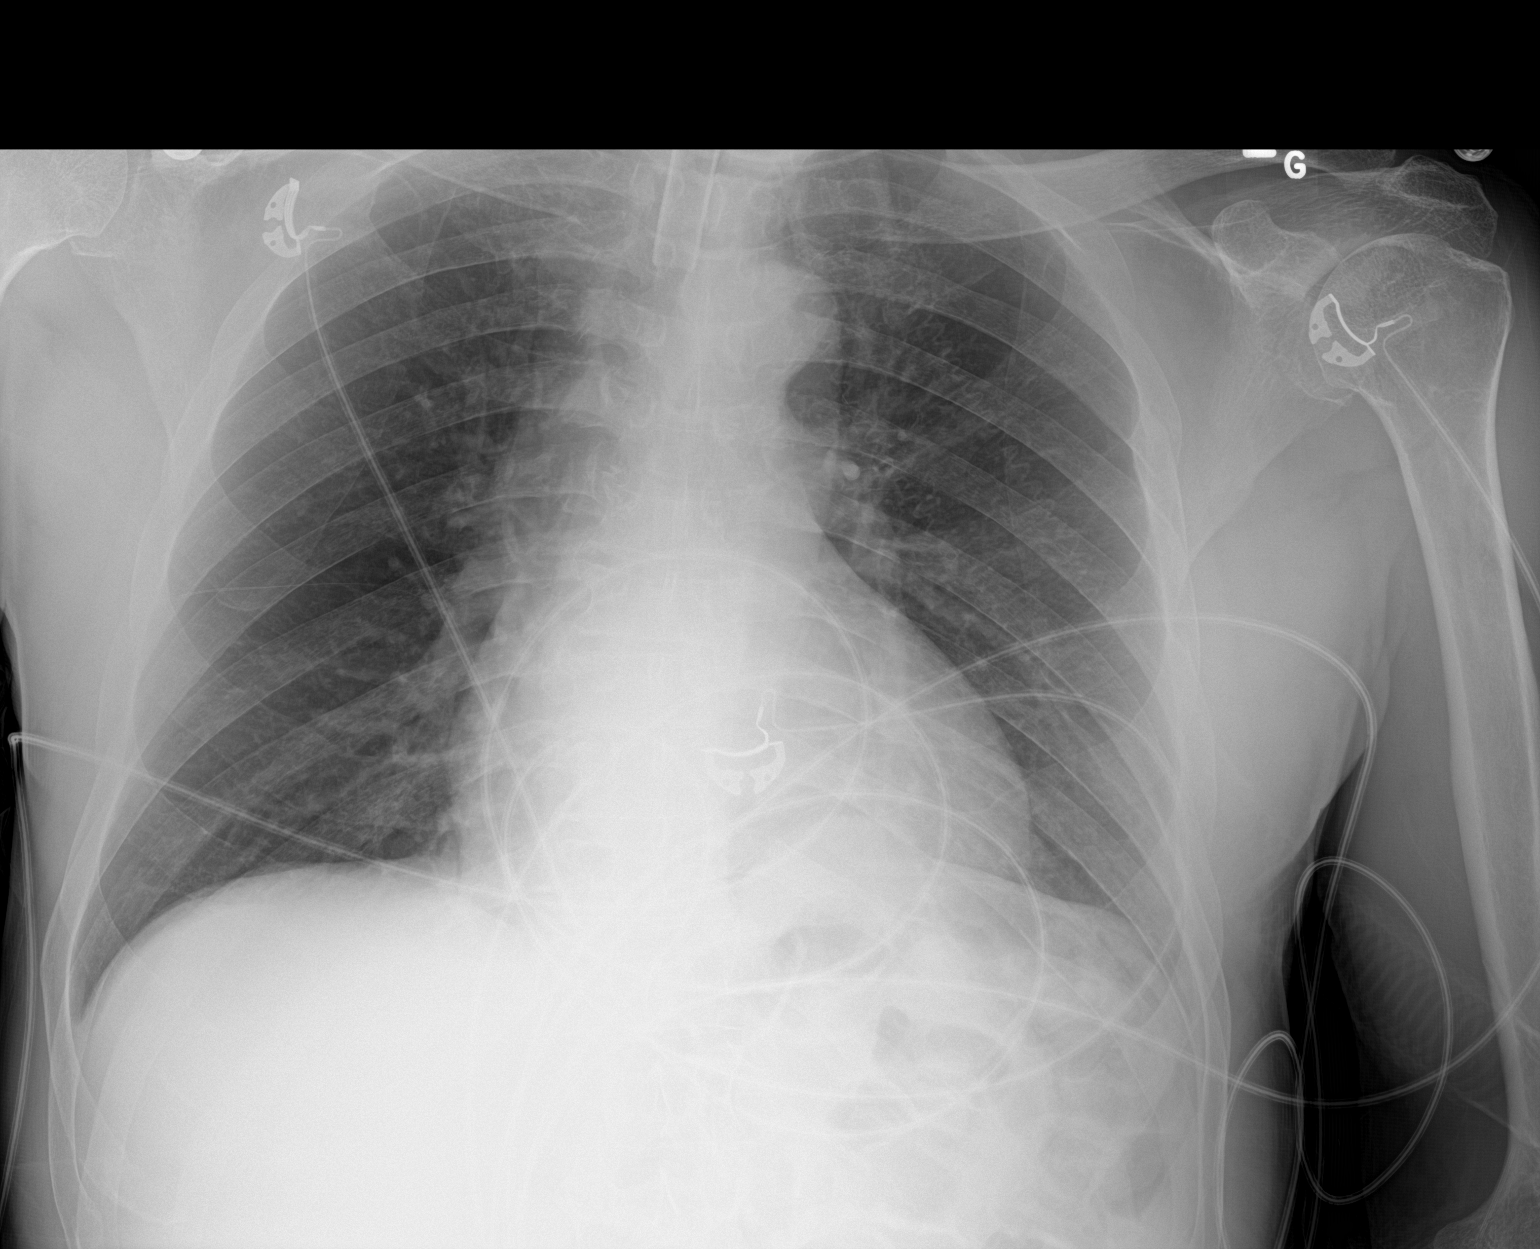

[1 of 1 positions shown; findings below may reference images not displayed]

FINDINGS: Tracheostomy noted in stable position. Mediastinum hilar structures
are normal. Heart size stable. Low lung volumes with mild bibasilar
atelectasis. No prominent pleural effusion. No pneumothorax. Old
left posterolateral third rib fracture. Thoracic spine scoliosis..
IMPRESSION: 1.  Tracheostomy tube in stable position.

2.  Low lung volumes with mild bibasilar atelectasis.

## 2020-02-20 DIAGNOSIS — Z792 Long term (current) use of antibiotics: Secondary | ICD-10-CM | POA: Diagnosis not present

## 2020-02-20 DIAGNOSIS — Z930 Tracheostomy status: Secondary | ICD-10-CM | POA: Diagnosis not present

## 2020-02-20 DIAGNOSIS — S2242XD Multiple fractures of ribs, left side, subsequent encounter for fracture with routine healing: Secondary | ICD-10-CM | POA: Diagnosis not present

## 2020-02-20 DIAGNOSIS — S270XXD Traumatic pneumothorax, subsequent encounter: Secondary | ICD-10-CM | POA: Diagnosis not present

## 2020-02-20 DIAGNOSIS — J449 Chronic obstructive pulmonary disease, unspecified: Secondary | ICD-10-CM | POA: Diagnosis not present

## 2020-02-20 DIAGNOSIS — S42192D Fracture of other part of scapula, left shoulder, subsequent encounter for fracture with routine healing: Secondary | ICD-10-CM | POA: Diagnosis not present

## 2020-02-20 DIAGNOSIS — J4530 Mild persistent asthma, uncomplicated: Secondary | ICD-10-CM | POA: Diagnosis not present

## 2020-02-20 DIAGNOSIS — I10 Essential (primary) hypertension: Secondary | ICD-10-CM | POA: Diagnosis not present

## 2020-02-20 DIAGNOSIS — S42022D Displaced fracture of shaft of left clavicle, subsequent encounter for fracture with routine healing: Secondary | ICD-10-CM | POA: Diagnosis not present

## 2020-02-20 DIAGNOSIS — Z7951 Long term (current) use of inhaled steroids: Secondary | ICD-10-CM | POA: Diagnosis not present

## 2020-03-01 DIAGNOSIS — Z930 Tracheostomy status: Secondary | ICD-10-CM | POA: Diagnosis not present

## 2020-03-05 ENCOUNTER — Telehealth: Payer: Self-pay | Admitting: Internal Medicine

## 2020-03-05 MED ORDER — ALBUTEROL SULFATE HFA 108 (90 BASE) MCG/ACT IN AERS
2.0000 | INHALATION_SPRAY | Freq: Four times a day (QID) | RESPIRATORY_TRACT | 5 refills | Status: DC | PRN
Start: 2020-03-05 — End: 2020-08-05

## 2020-03-05 MED ORDER — ALBUTEROL SULFATE (2.5 MG/3ML) 0.083% IN NEBU
2.5000 mg | INHALATION_SOLUTION | RESPIRATORY_TRACT | 5 refills | Status: DC | PRN
Start: 2020-03-05 — End: 2022-07-28
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
## 2020-03-05 NOTE — Telephone Encounter (Signed)
That's fine but needs ov with me w/in 6 weeks to regroup

## 2020-03-05 NOTE — Telephone Encounter (Signed)
Patient called and 6 week follow up appointment made. Albuterol medications changed over to Dr. Melvyn Novas as requested.

## 2020-03-06 NOTE — Telephone Encounter (Signed)
Error

## 2020-03-07 ENCOUNTER — Telehealth: Payer: Self-pay | Admitting: Internal Medicine

## 2020-03-07 NOTE — Telephone Encounter (Signed)
Message did not have a return phone number. Requested refills sent in under Dr. Melvyn Novas on 03/05/2020.

## 2020-03-08 ENCOUNTER — Other Ambulatory Visit: Payer: Self-pay | Admitting: *Deleted

## 2020-03-08 DIAGNOSIS — K429 Umbilical hernia without obstruction or gangrene: Secondary | ICD-10-CM | POA: Diagnosis not present

## 2020-03-08 DIAGNOSIS — J449 Chronic obstructive pulmonary disease, unspecified: Secondary | ICD-10-CM | POA: Diagnosis not present

## 2020-03-08 DIAGNOSIS — E785 Hyperlipidemia, unspecified: Secondary | ICD-10-CM | POA: Diagnosis not present

## 2020-03-08 DIAGNOSIS — J4530 Mild persistent asthma, uncomplicated: Secondary | ICD-10-CM | POA: Diagnosis not present

## 2020-03-08 DIAGNOSIS — I10 Essential (primary) hypertension: Secondary | ICD-10-CM | POA: Diagnosis not present
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
## 2020-03-10 IMAGING — DX DG CHEST 1V PORT
1 series · 1 of 1 positions shown · non-contrast
Comparison: 05/06/2018

CLINICAL DATA: Short of breath

EXAM:
PORTABLE CHEST 1 VIEW

[chest]
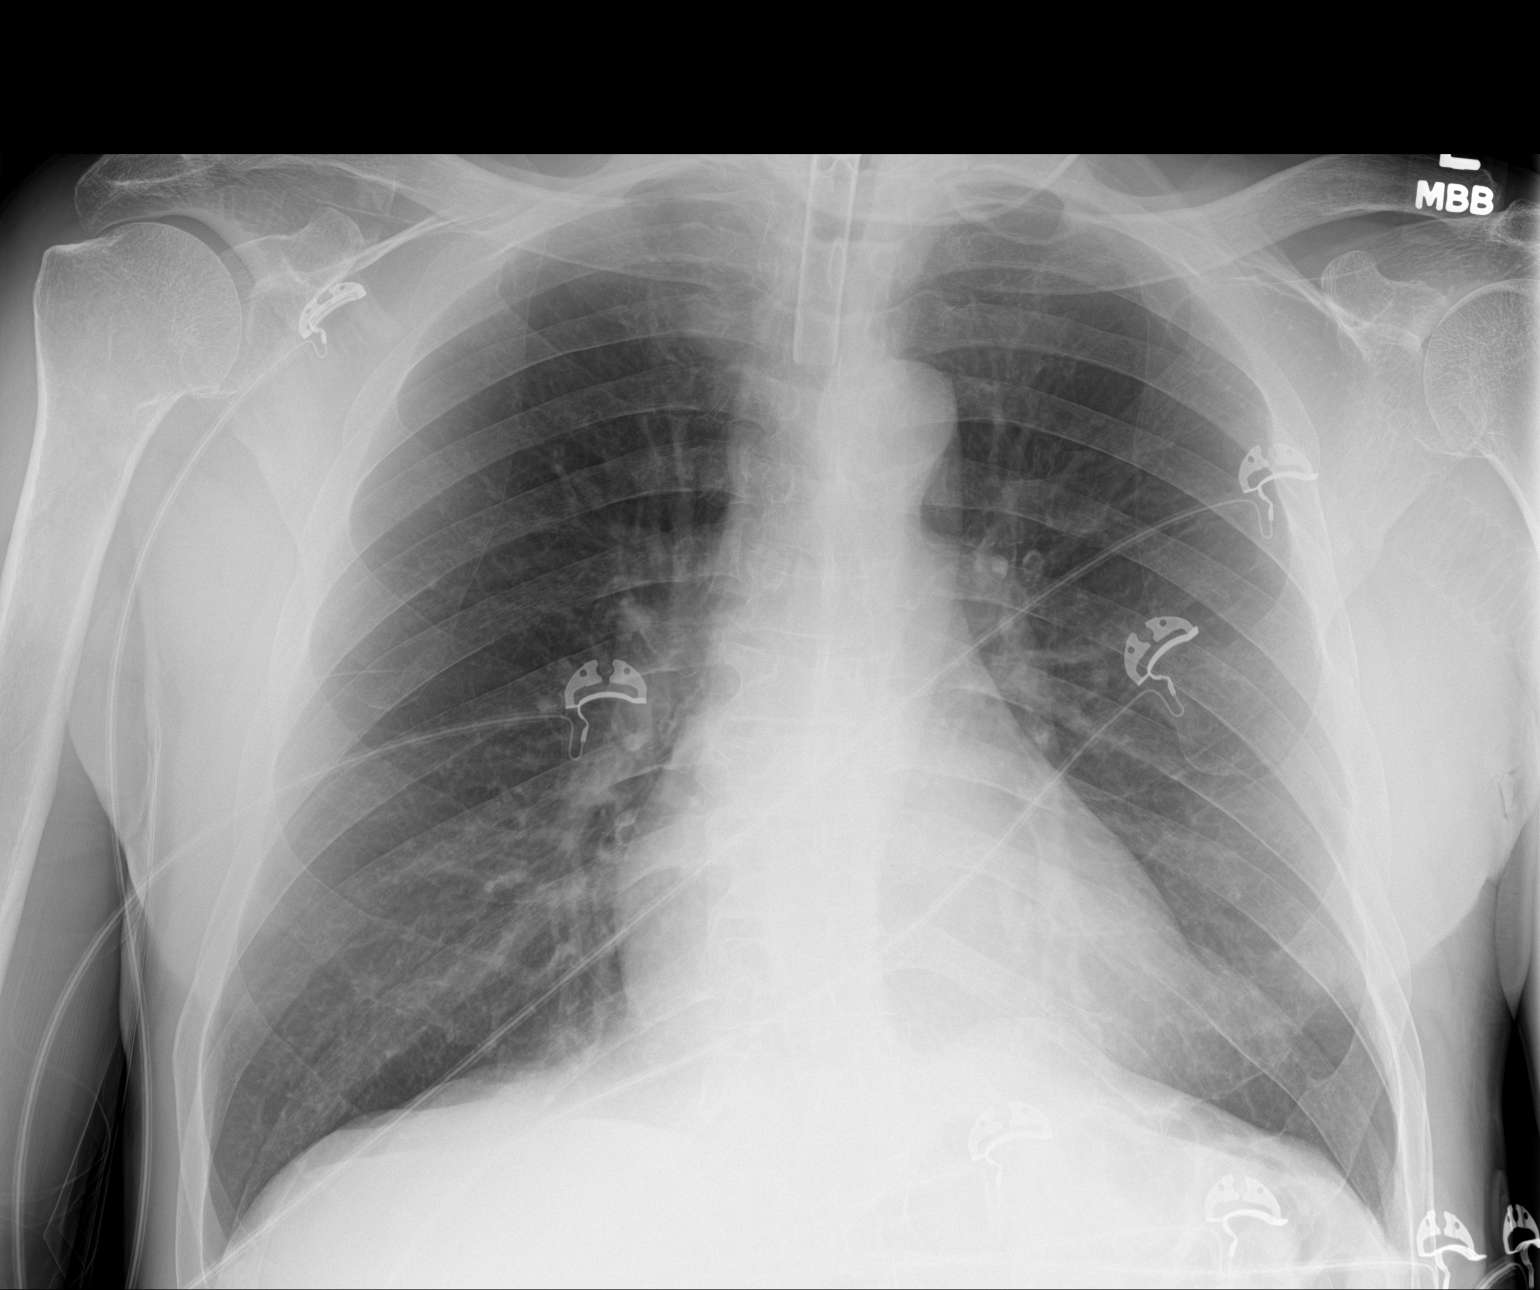

[1 of 1 positions shown; findings below may reference images not displayed]

FINDINGS: Stable tracheostomy tube. Normal heart size. Nipple shadow at the
left lung base is stable compared with 04/19/2018. Lungs are clear.
No pneumothorax.
IMPRESSION: No active disease.

## 2020-03-27 ENCOUNTER — Telehealth: Payer: Self-pay | Admitting: Internal Medicine

## 2020-03-27 NOTE — Telephone Encounter (Signed)
I found the surgery clearance request and it's on top of Dr Gustavus Bryant lookat stack in pod A.

## 2020-03-27 NOTE — Telephone Encounter (Signed)
I looked in [facility] folder and did not see a surgical clearance form for pt. Magda Paganini do you remember seeing this form before I ask for them to send it again?

## 2020-03-28 NOTE — Telephone Encounter (Signed)
Form done and faxed along with last ov note to [phone]

## 2020-04-02 ENCOUNTER — Encounter: Payer: Self-pay | Admitting: *Deleted

## 2020-04-02 ENCOUNTER — Other Ambulatory Visit: Payer: Self-pay | Admitting: *Deleted
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
## 2020-04-03 DIAGNOSIS — J4530 Mild persistent asthma, uncomplicated: Secondary | ICD-10-CM | POA: Diagnosis not present

## 2020-04-03 DIAGNOSIS — J449 Chronic obstructive pulmonary disease, unspecified: Secondary | ICD-10-CM | POA: Diagnosis not present

## 2020-04-03 DIAGNOSIS — E785 Hyperlipidemia, unspecified: Secondary | ICD-10-CM | POA: Diagnosis not present

## 2020-04-03 DIAGNOSIS — I10 Essential (primary) hypertension: Secondary | ICD-10-CM | POA: Diagnosis not present

## 2020-04-05 DIAGNOSIS — Z430 Encounter for attention to tracheostomy: Secondary | ICD-10-CM | POA: Diagnosis not present

## 2020-04-11 IMAGING — DX DG CHEST 1V PORT
1 series · 1 of 1 positions shown · non-contrast
Comparison: 05/27/2018

CLINICAL DATA: Difficulty breathing for 2 days. Shortness of
breath. History of COPD, hypertension, pneumonia.

EXAM:
PORTABLE CHEST 1 VIEW

[chest ap]
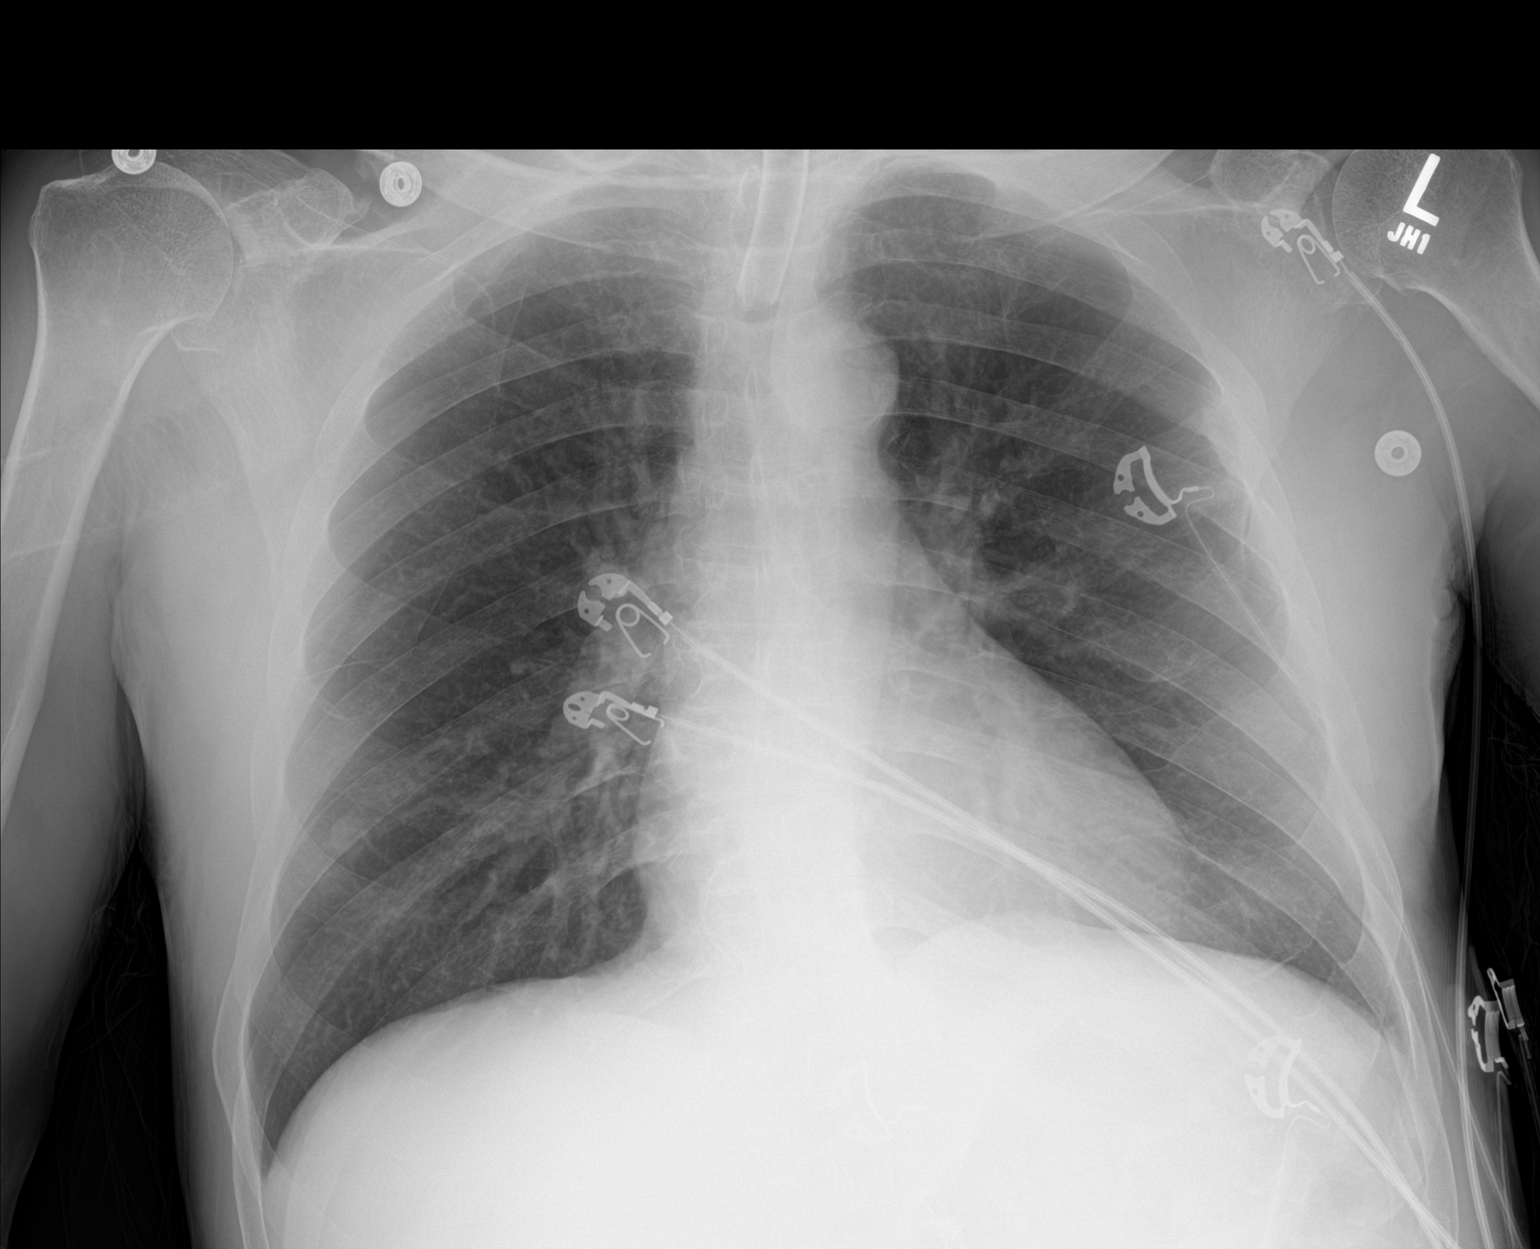

[1 of 1 positions shown; findings below may reference images not displayed]

FINDINGS: Normal heart size and pulmonary vascularity. Lungs are clear and
expanded. No airspace disease or consolidation. No blunting of
costophrenic angles. No pneumothorax. Endotracheal tube is present.
Old left rib fractures.
IMPRESSION: No active disease.

## 2020-04-22 IMAGING — DX DG CHEST 1V PORT
1 series · 1 of 1 positions shown · non-contrast
Comparison: 06/28/2018

CLINICAL DATA: History of COPD. Tracheostomy. COPD exacerbation.
History of asthma, hypertension, pneumonia.

EXAM:
PORTABLE CHEST 1 VIEW

[chest ap]
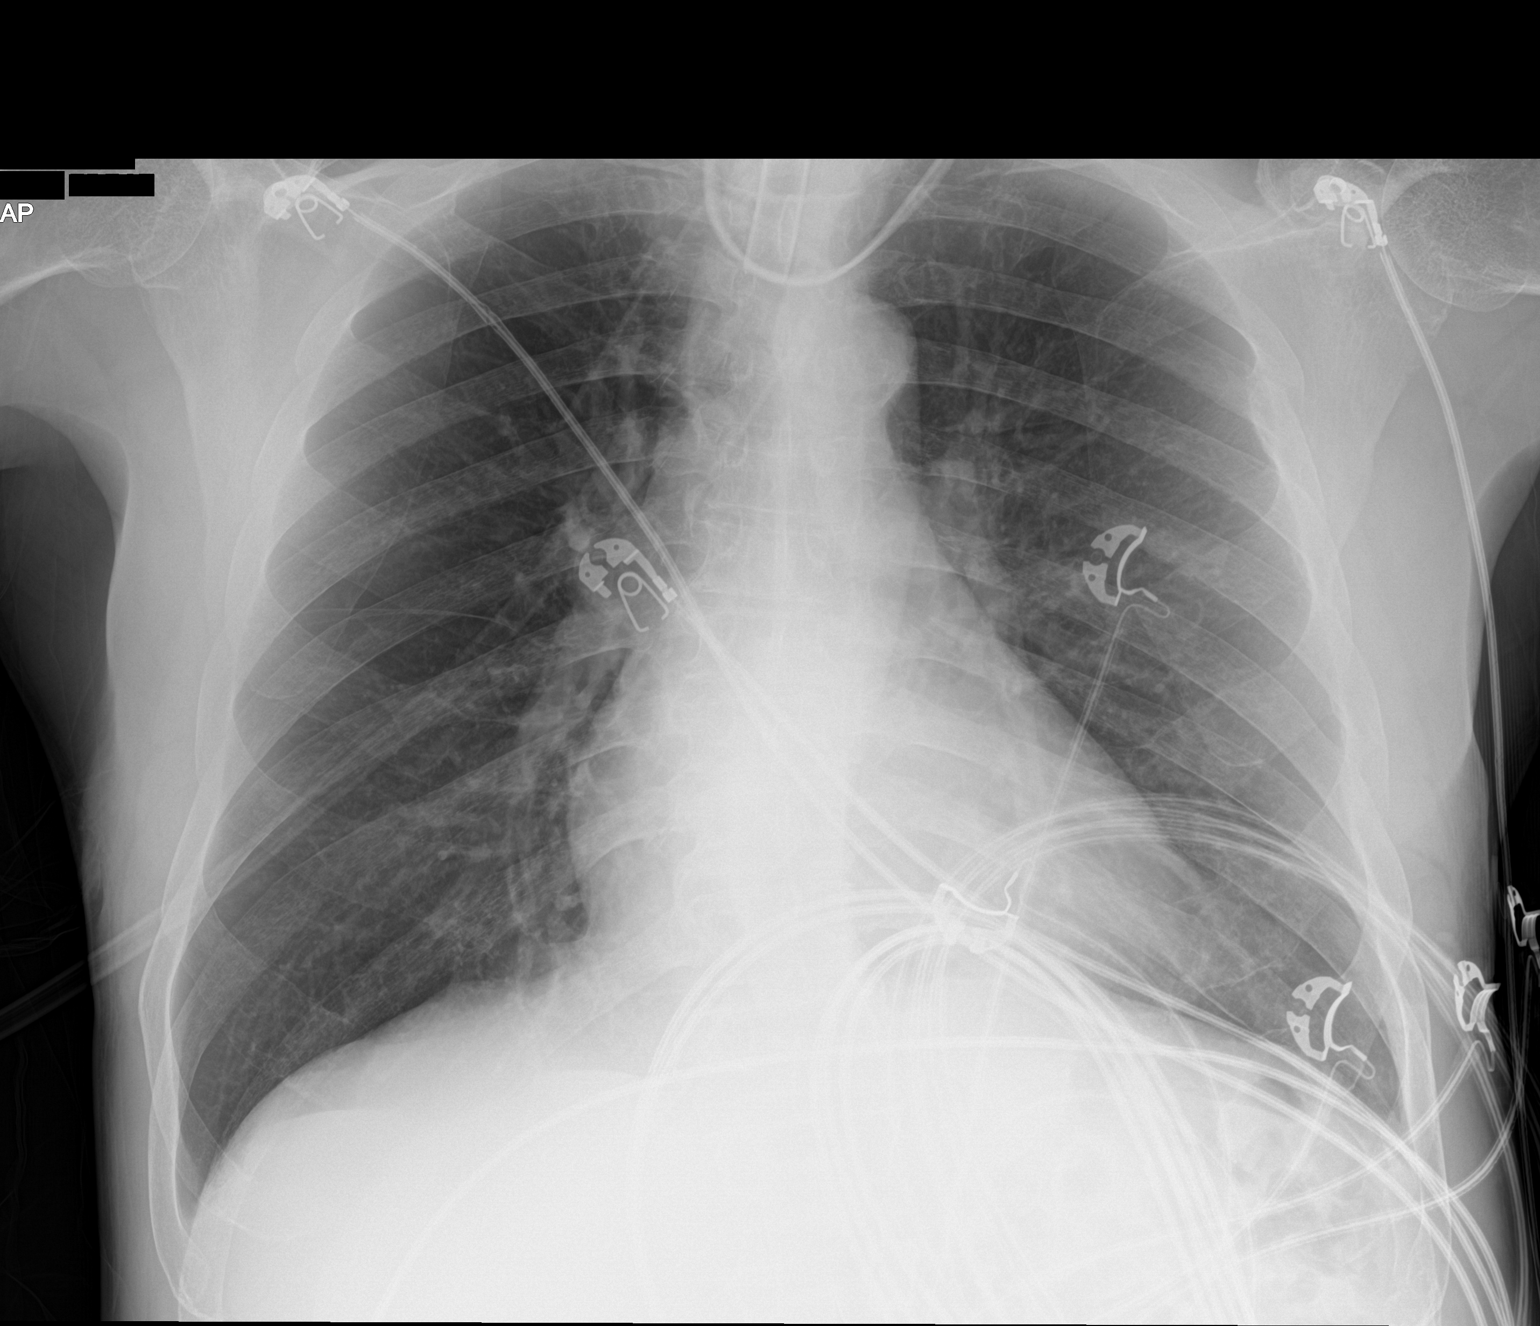

[1 of 1 positions shown; findings below may reference images not displayed]

FINDINGS: Tracheostomy is in place, tip 6.2 centimeters above the carina and
stable in appearance. The heart is normal in size. There are no
focal consolidations or pleural effusions. No pulmonary edema.
Remote LEFT rib fractures.
IMPRESSION: No evidence for acute cardiopulmonary abnormality.

## 2020-04-25 ENCOUNTER — Emergency Department (HOSPITAL_COMMUNITY): Payer: No Typology Code available for payment source

## 2020-04-25 ENCOUNTER — Emergency Department (HOSPITAL_COMMUNITY)
Admission: EM | Admit: 2020-04-25 | Discharge: 2020-04-26 | Disposition: A | Discharge status: Home / Self Care | Payer: No Typology Code available for payment source | Attending: Emergency Medicine | Admitting: Emergency Medicine

## 2020-04-25 ENCOUNTER — Other Ambulatory Visit: Payer: Self-pay

## 2020-04-25 DIAGNOSIS — J449 Chronic obstructive pulmonary disease, unspecified: Secondary | ICD-10-CM | POA: Insufficient documentation

## 2020-04-25 DIAGNOSIS — J45909 Unspecified asthma, uncomplicated: Secondary | ICD-10-CM | POA: Diagnosis not present

## 2020-04-25 DIAGNOSIS — T59811A Toxic effect of smoke, accidental (unintentional), initial encounter: Secondary | ICD-10-CM | POA: Insufficient documentation

## 2020-04-25 DIAGNOSIS — R0689 Other abnormalities of breathing: Secondary | ICD-10-CM | POA: Diagnosis not present

## 2020-04-25 DIAGNOSIS — T59814A Toxic effect of smoke, undetermined, initial encounter: Secondary | ICD-10-CM | POA: Diagnosis not present

## 2020-04-25 DIAGNOSIS — I10 Essential (primary) hypertension: Secondary | ICD-10-CM | POA: Diagnosis not present

## 2020-04-25 DIAGNOSIS — R6889 Other general symptoms and signs: Secondary | ICD-10-CM | POA: Diagnosis not present

## 2020-04-25 DIAGNOSIS — Z79899 Other long term (current) drug therapy: Secondary | ICD-10-CM | POA: Insufficient documentation

## 2020-04-25 DIAGNOSIS — I499 Cardiac arrhythmia, unspecified: Secondary | ICD-10-CM | POA: Diagnosis not present

## 2020-04-25 DIAGNOSIS — Z743 Need for continuous supervision: Secondary | ICD-10-CM | POA: Diagnosis not present

## 2020-04-25 MED ORDER — DOXYCYCLINE HYCLATE 100 MG PO CAPS
100.0000 mg | ORAL_CAPSULE | Freq: Two times a day (BID) | ORAL | 0 refills | Status: DC
Start: 2020-04-25 — End: 2020-05-24

## 2020-04-25 MED ORDER — DOXYCYCLINE HYCLATE 100 MG PO TABS
100.0000 mg | ORAL_TABLET | Freq: Once | ORAL | Status: AC
  Administered 2020-04-26: 100 mg via ORAL
  Filled 2020-04-25: qty 1

## 2020-04-25 MED ORDER — ALBUTEROL SULFATE HFA 108 (90 BASE) MCG/ACT IN AERS
2.0000 | INHALATION_SPRAY | Freq: Four times a day (QID) | RESPIRATORY_TRACT | Status: DC | PRN
  Administered 2020-04-26: 2 via RESPIRATORY_TRACT
  Filled 2020-04-25: qty 6.7

## 2020-04-25 MED ORDER — PREDNISONE 20 MG PO TABS
ORAL_TABLET | ORAL | 0 refills | Status: DC
Start: 2020-04-25 — End: 2020-05-24
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
## 2020-04-26 ENCOUNTER — Other Ambulatory Visit: Payer: Self-pay | Admitting: *Deleted
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
## 2020-04-29 DIAGNOSIS — Z430 Encounter for attention to tracheostomy: Secondary | ICD-10-CM | POA: Diagnosis not present

## 2020-04-30 ENCOUNTER — Ambulatory Visit: Payer: Self-pay | Admitting: General Surgery

## 2020-04-30 ENCOUNTER — Encounter: Payer: Self-pay | Admitting: Internal Medicine

## 2020-04-30 ENCOUNTER — Ambulatory Visit (INDEPENDENT_AMBULATORY_CARE_PROVIDER_SITE_OTHER): Payer: Medicare Other | Admitting: Internal Medicine

## 2020-04-30 ENCOUNTER — Other Ambulatory Visit: Payer: Self-pay

## 2020-04-30 ENCOUNTER — Ambulatory Visit (INDEPENDENT_AMBULATORY_CARE_PROVIDER_SITE_OTHER): Payer: No Typology Code available for payment source

## 2020-04-30 DIAGNOSIS — S2242XA Multiple fractures of ribs, left side, initial encounter for closed fracture: Secondary | ICD-10-CM | POA: Diagnosis not present

## 2020-04-30 DIAGNOSIS — R918 Other nonspecific abnormal finding of lung field: Secondary | ICD-10-CM | POA: Insufficient documentation

## 2020-04-30 DIAGNOSIS — R05 Cough: Secondary | ICD-10-CM | POA: Diagnosis not present

## 2020-04-30 DIAGNOSIS — I700 Atherosclerosis of aorta: Secondary | ICD-10-CM | POA: Diagnosis not present

## 2020-04-30 DIAGNOSIS — S42002A Fracture of unspecified part of left clavicle, initial encounter for closed fracture: Secondary | ICD-10-CM | POA: Diagnosis not present

## 2020-04-30 DIAGNOSIS — J449 Chronic obstructive pulmonary disease, unspecified: Secondary | ICD-10-CM | POA: Diagnosis not present

## 2020-04-30 DIAGNOSIS — R058 Other specified cough: Secondary | ICD-10-CM
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
## 2020-04-30 NOTE — Patient Instructions (Signed)
No change in medications    Please schedule a follow up visit in 6  months but call sooner if needed  
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
## 2020-05-01 ENCOUNTER — Encounter: Payer: Self-pay | Admitting: Internal Medicine

## 2020-05-01 NOTE — Assessment & Plan Note (Signed)
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 > referred to [facility] for second opinion> trach December 16 2016 > Trach surgery 03/15/17   / trach out end of summer /early fall 2018  10/27/17 ENT f/u "Ok" airway  - 01/14/2018 PFT f/u loop C/w mod severe truncation of insp loop > rx gerd and referred back to [facility]>  Re trach 03/14/18   He has component of asthma that is hard to tease out from uao because of all the transmitted noise but is doing well today on symb 160 per trach s/p recent flare and no need to change rx for now          Each maintenance medication was reviewed in detail including emphasizing most importantly the difference between maintenance and prns and under what circumstances the prns are to be triggered using an action plan format where appropriate.  Total time for H and P, chart review, counseling, teaching devices and generating customized AVS unique to this office visit / charting = 25 min

## 2020-05-08 DIAGNOSIS — J3802 Paralysis of vocal cords and larynx, bilateral: Secondary | ICD-10-CM | POA: Diagnosis not present

## 2020-05-08 DIAGNOSIS — Z430 Encounter for attention to tracheostomy: Secondary | ICD-10-CM | POA: Diagnosis not present

## 2020-05-14 NOTE — Progress Notes (Signed)
[id number] [place], [place] - [address] [place] [address] Phone: [phone] Fax: [phone]  [facility] - [place], [place] - [address] [place] [address] Phone: [phone] Fax: [phone]      Your procedure is scheduled on Friday, May 24, 2020.  Report to [facility] Main Entrance "A" at 9:00 A.M., and check in at the Admitting office.  Call this number if you have problems the morning of surgery:  [phone]  Call [phone] if you have any questions prior to your surgery date Monday-Friday 8am-4pm    Remember:  Do not eat after midnight the night before your surgery  You may drink clear liquids until 8:00 AM the morning of your surgery.   Clear liquids allowed are: Water, Non-Citrus Juices (without pulp), Carbonated Beverages, Clear Tea, Black Coffee Only, and Gatorade   Please complete your PRE-SURGERY ENSURE that was provided to you by 8:00 AM the morning of surgery.  Please, if able, drink it in one setting. DO NOT SIP.    Take these medicines the morning of surgery with A SIP OF WATER:  budesonide-formoterol (SYMBICORT) finasteride (PROSCAR)  AS NEEDED: Acetaminophen albuterol (PROVENTIL)  albuterol (VENTOLIN HFA) cetirizine (ZYRTEC) fluticasone (FLONASE) guaiFENesin (MUCINEX) sodium chloride 0.9 % nebulizer solution  As of today, STOP taking any Aspirin (unless otherwise instructed by your surgeon) Aleve, Naproxen, Ibuprofen, Motrin, Advil, Goody's, BC's, all herbal medications, fish oil, and all vitamins.                      Do not wear jewelry.            Do not wear lotions, powders, colognes, or deodorant.            Men may shave face and neck.            Do not bring valuables to the hospital.            [facility] is not responsible for any belongings or valuables.  Do NOT  Smoke (Tobacco/Vaping) or drink Alcohol 24 hours prior to your procedure If you use a CPAP at night, you may bring all equipment for your overnight stay.   Contacts, glasses, dentures or bridgework may not be worn into surgery.      For patients admitted to the hospital, discharge time will be determined by your treatment team.   Patients discharged the day of surgery will not be allowed to drive home, and someone needs to stay with them for 24 hours.    Special instructions:   [place]- Preparing For Surgery  Before surgery, you can play an important role. Because skin is not sterile, your skin needs to be as free of germs as possible. You can reduce the number of germs on your skin by washing with CHG (chlorahexidine gluconate) Soap before surgery.  CHG is an antiseptic cleaner which kills germs and bonds with the skin to continue killing germs even after washing.    Oral Hygiene is also important to reduce your risk of infection.  Remember - BRUSH YOUR TEETH THE MORNING OF SURGERY WITH YOUR REGULAR TOOTHPASTE  Please do not use if you have an allergy to CHG or antibacterial soaps. If your skin becomes reddened/irritated stop using the CHG.  Do not shave (including legs and underarms) for at least 48 hours prior  to first CHG shower. It is OK to shave your face.  Please follow these instructions carefully.   1. Shower the NIGHT BEFORE SURGERY and the MORNING OF SURGERY with CHG Soap.   2. If you chose to wash your hair, wash your hair first as usual with your normal shampoo.  3. After you shampoo, rinse your hair and body thoroughly to remove the shampoo.  4. Use CHG as you would any other liquid soap. You can apply CHG directly to the skin and wash gently with a scrungie or a clean washcloth.   5. Apply the CHG Soap to your body ONLY FROM THE NECK DOWN.  Do not use on open wounds or open sores. Avoid contact with your eyes, ears, mouth and genitals (private parts). Wash Face and  genitals (private parts)  with your normal soap.   6. Wash thoroughly, paying special attention to the area where your surgery will be performed.  7. Thoroughly rinse your body with warm water from the neck down.  8. DO NOT shower/wash with your normal soap after using and rinsing off the CHG Soap.  9. Pat yourself dry with a CLEAN TOWEL.  10. Wear CLEAN PAJAMAS to bed the night before surgery  11. Place CLEAN SHEETS on your bed the night of your first shower and DO NOT SLEEP WITH PETS.   Day of Surgery: Wear Clean/Comfortable clothing the morning of surgery Do not apply any deodorants/lotions.   Remember to brush your teeth WITH YOUR REGULAR TOOTHPASTE.   Please read over the following fact sheets that you were given.

## 2020-05-15 ENCOUNTER — Encounter (HOSPITAL_COMMUNITY)
Admission: RE | Admit: 2020-05-15 | Discharge: 2020-05-15 | Disposition: A | Discharge status: Home / Self Care | Payer: Medicare Other | Source: Ambulatory Visit | Attending: General Surgery | Admitting: General Surgery

## 2020-05-15 ENCOUNTER — Encounter (HOSPITAL_COMMUNITY): Payer: Self-pay

## 2020-05-15 ENCOUNTER — Other Ambulatory Visit: Payer: Self-pay

## 2020-05-15 DIAGNOSIS — I10 Essential (primary) hypertension: Secondary | ICD-10-CM | POA: Insufficient documentation

## 2020-05-15 DIAGNOSIS — Z930 Tracheostomy status: Secondary | ICD-10-CM | POA: Insufficient documentation

## 2020-05-15 DIAGNOSIS — Z01812 Encounter for preprocedural laboratory examination: Secondary | ICD-10-CM | POA: Diagnosis not present

## 2020-05-15 DIAGNOSIS — Z20822 Contact with and (suspected) exposure to covid-19: Secondary | ICD-10-CM | POA: Diagnosis not present

## 2020-05-15 DIAGNOSIS — Z79899 Other long term (current) drug therapy: Secondary | ICD-10-CM | POA: Insufficient documentation

## 2020-05-15 DIAGNOSIS — J449 Chronic obstructive pulmonary disease, unspecified: Secondary | ICD-10-CM | POA: Diagnosis not present

## 2020-05-15 DIAGNOSIS — K429 Umbilical hernia without obstruction or gangrene: Secondary | ICD-10-CM | POA: Diagnosis not present

## 2020-05-15 DIAGNOSIS — Z7901 Long term (current) use of anticoagulants: Secondary | ICD-10-CM | POA: Diagnosis not present

## 2020-05-15 LAB — COMPREHENSIVE METABOLIC PANEL
ALT: 26 U/L (ref 0–44)
AST: 28 U/L (ref 15–41)
Albumin: 3.8 g/dL (ref 3.5–5.0)
Alkaline Phosphatase: 99 U/L (ref 38–126)
Anion gap: 10 (ref 5–15)
BUN: 12 mg/dL (ref 8–23)
CO2: 23 mmol/L (ref 22–32)
Calcium: 9.5 mg/dL (ref 8.9–10.3)
Chloride: 100 mmol/L (ref 98–111)
Creatinine, Ser: 0.89 mg/dL (ref 0.61–1.24)
GFR calc Af Amer: >60 mL/min (ref >60)
GFR calc non Af Amer: >60 mL/min (ref >60)
Glucose, Bld: 104 mg/dL — ABNORMAL HIGH (ref 70–99)
Potassium: 4 mmol/L (ref 3.5–5.1)
Sodium: 133 mmol/L — ABNORMAL LOW (ref 135–145)
Total Bilirubin: 1 mg/dL (ref 0.3–1.2)
Total Protein: 7.9 g/dL (ref 6.5–8.1)

## 2020-05-15 LAB — CBC
HCT: 44.4 % (ref 39.0–52.0)
Hemoglobin: 14.7 g/dL (ref 13.0–17.0)
MCH: 30.5 pg (ref 26.0–34.0)
MCHC: 33.1 g/dL (ref 30.0–36.0)
MCV: 92.1 fL (ref 80.0–100.0)
Platelets: 300 10*3/uL (ref 150–400)
RBC: 4.82 MIL/uL (ref 4.22–5.81)
RDW: 13.5 % (ref 11.5–15.5)
WBC: 4.6 10*3/uL (ref 4.0–10.5)
nRBC: 0 % (ref 0.0–0.2)

## 2020-05-15 NOTE — Progress Notes (Signed)
PCP - Dr. Caren Macadam  Cardiologist - Denies  Pulmon- Dr. Melvyn Novas  Chest x-ray - 05/01/20 (E)  EKG - 09/10/2019 (E)  Stress Test - Denies  ECHO - 09/21/15 (E)  Cardiac Cath - 12/24/11 (E)  AICD- na PM- na LOOP- na  Sleep Study - Yes- Negative CPAP -  Denies  LABS- 05/15/20: CBC, CMP 05/21/20: COVID  ASA- Denies  ERAS- Yes- Ensure x1  HA1C- Denies  Anesthesia- Yes- Medical history  Pt denies having chest pain, sob, or fever at this time. All instructions explained to the pt, with a verbal understanding of the material. Pt agrees to go over the instructions while at home for a better understanding. Pt also instructed to self quarantine after being tested for COVID-19. The opportunity to ask questions was provided.   Coronavirus Screening  Have you experienced the following symptoms:  Cough yes/no: No Fever (>100.50F)  yes/no: No Runny nose yes/no: No Sore throat yes/no: No Difficulty breathing/shortness of breath  yes/no: No  Have you or a family member traveled in the last 14 days and where? yes/no: No   If the patient indicates "YES" to the above questions, their PAT will be rescheduled to limit the exposure to others and, the surgeon will be notified. THE PATIENT WILL NEED TO BE ASYMPTOMATIC FOR 14 DAYS.   If the patient is not experiencing any of these symptoms, the PAT nurse will instruct them to NOT bring anyone with them to their appointment since they may have these symptoms or traveled as well.   Please remind your patients and families that hospital visitation restrictions are in effect and the importance of the restrictions.
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
## 2020-05-21 ENCOUNTER — Other Ambulatory Visit (HOSPITAL_COMMUNITY)
Admission: RE | Admit: 2020-05-21 | Discharge: 2020-05-21 | Disposition: A | Discharge status: Home / Self Care | Payer: Medicare Other | Source: Ambulatory Visit | Attending: General Surgery | Admitting: General Surgery

## 2020-05-21 DIAGNOSIS — Z20822 Contact with and (suspected) exposure to covid-19: Secondary | ICD-10-CM | POA: Insufficient documentation

## 2020-05-21 DIAGNOSIS — Z01812 Encounter for preprocedural laboratory examination: Secondary | ICD-10-CM | POA: Diagnosis not present

## 2020-05-22 LAB — SARS CORONAVIRUS 2 (TAT 6-24 HRS): SARS Coronavirus 2: NEGATIVE

## 2020-05-24 ENCOUNTER — Other Ambulatory Visit: Payer: Self-pay

## 2020-05-24 ENCOUNTER — Ambulatory Visit (HOSPITAL_COMMUNITY): Payer: Medicare Other | Admitting: Anesthesiology

## 2020-05-24 ENCOUNTER — Encounter (HOSPITAL_COMMUNITY): Payer: Self-pay | Admitting: General Surgery

## 2020-05-24 ENCOUNTER — Ambulatory Visit (HOSPITAL_COMMUNITY): Payer: Medicare Other | Admitting: Vascular Surgery

## 2020-05-24 ENCOUNTER — Ambulatory Visit (HOSPITAL_COMMUNITY)
Admission: RE | Admit: 2020-05-24 | Discharge: 2020-05-24 | Disposition: A | Discharge status: Home / Self Care | Payer: Medicare Other | Attending: General Surgery | Admitting: General Surgery

## 2020-05-24 ENCOUNTER — Encounter (HOSPITAL_COMMUNITY)
Admission: RE | Disposition: A | Discharge status: Home / Self Care | Payer: Self-pay | Source: Home / Self Care | Attending: General Surgery

## 2020-05-24 DIAGNOSIS — F419 Anxiety disorder, unspecified: Secondary | ICD-10-CM | POA: Diagnosis not present

## 2020-05-24 DIAGNOSIS — K429 Umbilical hernia without obstruction or gangrene: Secondary | ICD-10-CM | POA: Insufficient documentation

## 2020-05-24 DIAGNOSIS — Z86010 Personal history of colonic polyps: Secondary | ICD-10-CM | POA: Diagnosis not present

## 2020-05-24 DIAGNOSIS — Z981 Arthrodesis status: Secondary | ICD-10-CM | POA: Diagnosis not present

## 2020-05-24 DIAGNOSIS — Z7952 Long term (current) use of systemic steroids: Secondary | ICD-10-CM | POA: Insufficient documentation

## 2020-05-24 DIAGNOSIS — Z7951 Long term (current) use of inhaled steroids: Secondary | ICD-10-CM | POA: Diagnosis not present

## 2020-05-24 DIAGNOSIS — I10 Essential (primary) hypertension: Secondary | ICD-10-CM | POA: Diagnosis not present

## 2020-05-24 DIAGNOSIS — J449 Chronic obstructive pulmonary disease, unspecified: Secondary | ICD-10-CM | POA: Insufficient documentation

## 2020-05-24 DIAGNOSIS — Z79899 Other long term (current) drug therapy: Secondary | ICD-10-CM | POA: Diagnosis not present

## 2020-05-24 DIAGNOSIS — M1990 Unspecified osteoarthritis, unspecified site: Secondary | ICD-10-CM | POA: Insufficient documentation

## 2020-05-24 DIAGNOSIS — Z825 Family history of asthma and other chronic lower respiratory diseases: Secondary | ICD-10-CM | POA: Insufficient documentation

## 2020-05-24 DIAGNOSIS — Z809 Family history of malignant neoplasm, unspecified: Secondary | ICD-10-CM | POA: Insufficient documentation

## 2020-05-24 DIAGNOSIS — J441 Chronic obstructive pulmonary disease with (acute) exacerbation: Secondary | ICD-10-CM | POA: Diagnosis not present

## 2020-05-24 HISTORY — PX: UMBILICAL HERNIA REPAIR: SHX196

## 2020-05-24 SURGERY — REPAIR, HERNIA, UMBILICAL, ADULT
Anesthesia: General | Site: Abdomen

## 2020-05-24 MED ORDER — LACTATED RINGERS IV SOLN: INTRAVENOUS | Status: DC

## 2020-05-24 MED ORDER — DEXAMETHASONE SODIUM PHOSPHATE 10 MG/ML IJ SOLN
INTRAMUSCULAR | Status: DC | PRN
  Administered 2020-05-24: 10 mg via INTRAVENOUS

## 2020-05-24 MED ORDER — ONDANSETRON HCL 4 MG/2ML IJ SOLN: 4.0000 mg | Freq: Once | INTRAMUSCULAR | Status: DC | PRN

## 2020-05-24 MED ORDER — ENSURE PRE-SURGERY PO LIQD: 296.0000 mL | Freq: Once | ORAL | Status: DC

## 2020-05-24 MED ORDER — FENTANYL CITRATE (PF) 100 MCG/2ML IJ SOLN
INTRAMUSCULAR | Status: DC | PRN
Start: 2020-05-24 — End: 2020-05-24
  Administered 2020-05-24: 100 ug via INTRAVENOUS

## 2020-05-24 MED ORDER — CHLORHEXIDINE GLUCONATE CLOTH 2 % EX PADS: 6.0000 | MEDICATED_PAD | Freq: Once | CUTANEOUS | Status: DC

## 2020-05-24 MED ORDER — PROPOFOL 10 MG/ML IV BOLUS
INTRAVENOUS | Status: DC | PRN
  Administered 2020-05-24: 160 mg via INTRAVENOUS

## 2020-05-24 MED ORDER — LIDOCAINE 2% (20 MG/ML) 5 ML SYRINGE
INTRAMUSCULAR | Status: DC | PRN
  Administered 2020-05-24: 40 mg via INTRAVENOUS

## 2020-05-24 MED ORDER — ACETAMINOPHEN 500 MG PO TABS
ORAL_TABLET | ORAL | Status: AC
  Administered 2020-05-24: 1000 mg via ORAL
  Filled 2020-05-24: qty 2

## 2020-05-24 MED ORDER — SUGAMMADEX SODIUM 200 MG/2ML IV SOLN
INTRAVENOUS | Status: DC | PRN
  Administered 2020-05-24: 200 mg via INTRAVENOUS

## 2020-05-24 MED ORDER — 0.9 % SODIUM CHLORIDE (POUR BTL) OPTIME
TOPICAL | Status: DC | PRN
  Administered 2020-05-24: 1000 mL

## 2020-05-24 MED ORDER — EPHEDRINE SULFATE 50 MG/ML IJ SOLN
INTRAMUSCULAR | Status: DC | PRN
  Administered 2020-05-24: 10 mg via INTRAVENOUS

## 2020-05-24 MED ORDER — BUPIVACAINE HCL (PF) 0.25 % IJ SOLN
INTRAMUSCULAR | Status: DC | PRN
  Administered 2020-05-24: 30 mL

## 2020-05-24 MED ORDER — OXYCODONE HCL 5 MG PO TABS: 5.0000 mg | ORAL_TABLET | Freq: Four times a day (QID) | ORAL | 0 refills | Status: DC | PRN

## 2020-05-24 MED ORDER — PROPOFOL 10 MG/ML IV BOLUS
INTRAVENOUS | Status: AC
  Filled 2020-05-24: qty 20

## 2020-05-24 MED ORDER — ROCURONIUM BROMIDE 10 MG/ML (PF) SYRINGE
PREFILLED_SYRINGE | INTRAVENOUS | Status: DC | PRN
  Administered 2020-05-24: 100 mg via INTRAVENOUS

## 2020-05-24 MED ORDER — BUPIVACAINE HCL (PF) 0.25 % IJ SOLN
INTRAMUSCULAR | Status: AC
  Filled 2020-05-24: qty 30

## 2020-05-24 MED ORDER — CEFAZOLIN SODIUM-DEXTROSE 2-4 GM/100ML-% IV SOLN
2.0000 g | INTRAVENOUS | Status: AC
  Administered 2020-05-24: 2 g via INTRAVENOUS

## 2020-05-24 MED ORDER — IBUPROFEN 800 MG PO TABS: 800.0000 mg | ORAL_TABLET | Freq: Three times a day (TID) | ORAL | 0 refills | Status: DC | PRN

## 2020-05-24 MED ORDER — FENTANYL CITRATE (PF) 250 MCG/5ML IJ SOLN
INTRAMUSCULAR | Status: AC
  Filled 2020-05-24: qty 5

## 2020-05-24 MED ORDER — KETOROLAC TROMETHAMINE 15 MG/ML IJ SOLN
INTRAMUSCULAR | Status: AC
  Administered 2020-05-24: 15 mg via INTRAVENOUS
  Filled 2020-05-24: qty 1

## 2020-05-24 MED ORDER — FENTANYL CITRATE (PF) 100 MCG/2ML IJ SOLN: 25.0000 ug | INTRAMUSCULAR | Status: DC | PRN

## 2020-05-24 MED ORDER — MEPERIDINE HCL 25 MG/ML IJ SOLN: 6.2500 mg | INTRAMUSCULAR | Status: DC | PRN

## 2020-05-24 MED ORDER — ACETAMINOPHEN 500 MG PO TABS: 1000.0000 mg | ORAL_TABLET | ORAL | Status: AC

## 2020-05-24 MED ORDER — CHLORHEXIDINE GLUCONATE 0.12 % MT SOLN
15.0000 mL | Freq: Once | OROMUCOSAL | Status: AC
  Administered 2020-05-24: 15 mL via OROMUCOSAL
  Filled 2020-05-24: qty 15

## 2020-05-24 MED ORDER — KETOROLAC TROMETHAMINE 15 MG/ML IJ SOLN: 15.0000 mg | INTRAMUSCULAR | Status: AC

## 2020-05-24 MED ORDER — CEFAZOLIN SODIUM-DEXTROSE 2-4 GM/100ML-% IV SOLN
INTRAVENOUS | Status: AC
  Filled 2020-05-24: qty 100

## 2020-05-24 MED ORDER — ONDANSETRON HCL 4 MG/2ML IJ SOLN
INTRAMUSCULAR | Status: DC | PRN
  Administered 2020-05-24: 4 mg via INTRAVENOUS

## 2020-05-24 MED ORDER — ORAL CARE MOUTH RINSE: 15.0000 mL | Freq: Once | OROMUCOSAL | Status: AC

## 2020-05-24 SURGICAL SUPPLY — 35 items
ADH SKN CLS APL DERMABOND .7 (GAUZE/BANDAGES/DRESSINGS) ×1
APL PRP STRL LF DISP 70% ISPRP (MISCELLANEOUS) ×1
BLADE CLIPPER SURG (BLADE) IMPLANT
CANISTER SUCT 3000ML PPV (MISCELLANEOUS) ×3 IMPLANT
CHLORAPREP W/TINT 26 (MISCELLANEOUS) ×3 IMPLANT
COVER SURGICAL LIGHT HANDLE (MISCELLANEOUS) ×3 IMPLANT
COVER WAND RF STERILE (DRAPES) ×3 IMPLANT
DERMABOND ADVANCED (GAUZE/BANDAGES/DRESSINGS) ×2
DERMABOND ADVANCED .7 DNX12 (GAUZE/BANDAGES/DRESSINGS) ×1 IMPLANT
DRAPE LAPAROTOMY 100X72 PEDS (DRAPES) ×3 IMPLANT
ELECT REM PT RETURN 9FT ADLT (ELECTROSURGICAL) ×3
ELECTRODE REM PT RTRN 9FT ADLT (ELECTROSURGICAL) ×1 IMPLANT
GLOVE BIOGEL PI IND STRL 7.0 (GLOVE) ×1 IMPLANT
GLOVE BIOGEL PI INDICATOR 7.0 (GLOVE) ×2
GLOVE SURG SS PI 7.0 STRL IVOR (GLOVE) ×3 IMPLANT
GOWN STRL REUS W/ TWL LRG LVL3 (GOWN DISPOSABLE) ×2 IMPLANT
GOWN STRL REUS W/TWL LRG LVL3 (GOWN DISPOSABLE) ×6
KIT BASIN OR (CUSTOM PROCEDURE TRAY) ×3 IMPLANT
KIT TURNOVER KIT B (KITS) ×3 IMPLANT
NDL HYPO 25GX1X1/2 BEV (NEEDLE) ×1 IMPLANT
NEEDLE 22X1 1/2 (OR ONLY) (NEEDLE) ×3 IMPLANT
NEEDLE HYPO 25GX1X1/2 BEV (NEEDLE) ×3 IMPLANT
NS IRRIG 1000ML POUR BTL (IV SOLUTION) ×3 IMPLANT
PACK GENERAL/GYN (CUSTOM PROCEDURE TRAY) ×3 IMPLANT
PAD ARMBOARD 7.5X6 YLW CONV (MISCELLANEOUS) ×3 IMPLANT
PENCIL SMOKE EVACUATOR (MISCELLANEOUS) ×3 IMPLANT
SUT MNCRL AB 4-0 PS2 18 (SUTURE) ×3 IMPLANT
SUT NOVA 0 T19/GS 22DT (SUTURE) IMPLANT
SUT NOVA 0 T20/GS 25DT 4464 63 (SUTURE) IMPLANT
SUT PDS AB 0 CT1 27 (SUTURE) ×4 IMPLANT
SUT VIC AB 3-0 SH 27 (SUTURE) ×3
SUT VIC AB 3-0 SH 27XBRD (SUTURE) ×1 IMPLANT
SYR CONTROL 10ML LL (SYRINGE) ×3 IMPLANT
TOWEL GREEN STERILE (TOWEL DISPOSABLE) ×3 IMPLANT
TOWEL GREEN STERILE FF (TOWEL DISPOSABLE) ×3 IMPLANT

## 2020-05-24 NOTE — Op Note (Addendum)
PATIENT:  [name] [name]  69 y.o. male  PRE-OPERATIVE DIAGNOSIS:  umbilical hernia  POST-OPERATIVE DIAGNOSIS:  umbilical hernia  PROCEDURE:  Procedure(s): OPEN UMBILICAL HERNIA REPAIR WITH  MESH   SURGEON:  Surgeon(s): Jaqualin Serpa, Arta Bruce, MD Dwan Bolt, MD  ASSISTANT: none   ANESTHESIA:   local and general  Indications for procedure: [name] is a 69 y.o. year old male with symptoms of abdominal pain and enlarging umbilical hernia.  Description of procedure: The patient was brought into the operative suite. Anesthesia was administered with General endotracheal anesthesia. WHO checklist was applied. The patient was then placed in supine. The area was prepped and draped in the usual sterile fashion.  Next the infraumbilical skin was anesthetized with marcaine. A semilunar infraumbilical incision was made. Cautery and blunt dissection was used to dissect down to the fascia. The hernia sac was dissected free from surrounding tissues in 360 degrees. The umbilical skin was dissected free of the hernia sac with cautery. The hernia was filled with omentum and was reduced. The contents of the hernia sac were reduced. The hernia defect was 1.5 cm in diameter. The hernia sac was removed. Due to the size of the hernia, no mesh was utilized. The fascial defect was then primarily closed with interrupted 0 PDS sutures. The umbilical skin was sutured to the fascia with a 3-0 vicryl. The deep dermal space was closed with a 3-0 vicryl. Marcaine was injected into the muscle layer and around the fascia. The skin was closed with a 4-0 monocryl subcuticular suture. Dermabond was put in place for dressing. The patient awoke from anesthesia and was brought to pacu in stable condition. All counts were correct.  Dr. Zenia Resides was present for the entirety of the surgery and integral for retraction, dissection, and decision making during the case.  Findings: 1.5 cm umbilical hernia  Specimen: none  Blood  loss: 10 ml ml  Local anesthesia: 30 ml marcaine  Complications: none  PLAN OF CARE: Discharge to home after PACU  PATIENT DISPOSITION:  PACU - hemodynamically stable.  [name], M.D. General, Bariatric, & Minimally Invasive Surgery [facility], [place]  05/24/2020 12:36 PM

## 2020-05-24 NOTE — Anesthesia Procedure Notes (Signed)
Procedure Name: Intubation Date/Time: 05/24/2020 11:58 AM Performed by: Babs Bertin, CRNA Pre-anesthesia Checklist: Patient identified, Emergency Drugs available, Suction available, Patient being monitored and Timeout performed Patient Re-evaluated:Patient Re-evaluated prior to induction Oxygen Delivery Method: Circle system utilized Preoxygenation: Pre-oxygenation with 100% oxygen (pediatric mask over trach) Induction Type: IV induction Tube type: Reinforced (trach removed and reinforced ETT inserted) Tube size: 6.0 mm Number of attempts: 1 Placement Confirmation: positive ETCO2 and breath sounds checked- equal and bilateral Secured at: 14 (at stoma) cm Tube secured with: Tape

## 2020-05-24 NOTE — H&P (Signed)
[name] [name] is an 69 y.o. male.   Chief Complaint: abdominal hernia HPI: 69 yo male with symptomatic umbilical hernia. He has a history of trach and his airway is stable.  Past Medical History:  Diagnosis Date  . Abnormal EKG 12/24/11   anteroseptal and lateral ST elevation, felt r/t early repolarization;  Cardiac cath 12/24/11 - normal coronary anatomy, EF 55-65%  . Anxiety   . Arthritis   . Asthma   . Bradycardia, sinus 12/24/11  . COPD (chronic obstructive pulmonary disease) ([place])   . Dyspnea   . H/O tracheostomy   . History of alcohol abuse    hospitalized for detox 2002  . HTN (hypertension)   . Hypotension 12/24/11   in the setting of dehydration   . Marijuana use   . Pneumonia   . Syncope and collapse 12/24/11   2/2 hypotension in the setting of dehydration  . Upper airway cough syndrome     Past Surgical History:  Procedure Laterality Date  . CARDIAC CATHETERIZATION  12/24/11   normal coronary anatomy, EF 55-65%  . CIRCUMCISION  1972  . COLONOSCOPY WITH PROPOFOL N/A 02/24/2018   Procedure: COLONOSCOPY WITH PROPOFOL;  Surgeon: Otis Brace, MD;  [facility];  Service: Gastroenterology;  Laterality: N/A;  . CYSTOSCOPY W/ RETROGRADES Bilateral 03/08/2019   Procedure: CYSTOSCOPY WITH RETROGRADE PYELOGRAM;  Surgeon: Alexis Frock, MD;  [facility];  Service: Urology;  Laterality: Bilateral;  . CYSTOSCOPY WITH BIOPSY N/A 03/08/2019   Procedure: CYSTOSCOPY WITH BIOPSY WITH FUGERATION;  Surgeon: Alexis Frock, MD;  [facility];  Service: Urology;  Laterality: N/A;  . ESOPHAGOGASTRODUODENOSCOPY N/A 10/09/2015   Procedure: ESOPHAGOGASTRODUODENOSCOPY (EGD);  Surgeon: Judeth Horn, MD;  Location: [facility] ENDOSCOPY;  Service: General;  Laterality: N/A;  . IRRIGATION AND DEBRIDEMENT KNEE Right 05/23/2017   Procedure: IRRIGATION AND DEBRIDEMENT KNEE;  Surgeon: Wylene Simmer, MD;  [facility]: [facility];  Service: Orthopedics;  Laterality: Right;  . LACERATION REPAIR  02/2004    arthroscopic debridement of triagular fibrocartilage tear/E-chart; right wrist  . LEFT HEART CATHETERIZATION WITH CORONARY ANGIOGRAM N/A 12/24/2011   Procedure: LEFT HEART CATHETERIZATION WITH CORONARY ANGIOGRAM;  Surgeon: Peter M Martinique, MD;  Location: [facility] CATH LAB;  Service: Cardiovascular;  Laterality: N/A;  . PEG PLACEMENT N/A 10/09/2015   Procedure: PERCUTANEOUS ENDOSCOPIC GASTROSTOMY (PEG) PLACEMENT;  Surgeon: Judeth Horn, MD;  Location: [place];  Service: General;  Laterality: N/A;  . PEG TUBE REMOVAL    . PERCUTANEOUS TRACHEOSTOMY N/A 10/09/2015   Procedure: PERCUTANEOUS TRACHEOSTOMY;  Surgeon: Judeth Horn, MD;  Location: [place];  Service: General;  Laterality: N/A;  . POLYPECTOMY  02/24/2018   Procedure: POLYPECTOMY;  Surgeon: Otis Brace, MD;  [facility];  Service: Gastroenterology;;  . SINUS ENDO WITH FUSION  06/07/2018  . SINUS ENDO WITH FUSION Bilateral 06/07/2018   Procedure: SINUS ENDO WITH FUSION;  Surgeon: Melissa Montane, MD;  Location: [facility] OR;  Service: ENT;  Laterality: Bilateral;  with fuision    Family History  Problem Relation Age of Onset  . COPD Sister   . Cancer Sister   . Asthma Other    Social History:  reports that he has never smoked. He has never used smokeless tobacco. He reports current alcohol use of about 2.0 standard drinks of alcohol per week. He reports previous drug use. Drug: Marijuana.  Allergies: No Known Allergies  Medications Prior to Admission  Medication Sig Dispense Refill  . acetaminophen 325 MG tablet Take 2 tablets (650 mg total) by  mouth every 6 (six) hours as needed. 30 tablet 0  . albuterol (PROVENTIL) (2.5 MG/3ML) 0.083% nebulizer solution Take 3 mLs (2.5 mg total) by nebulization every 4 (four) hours as needed for wheezing or shortness of breath. 75 mL 5  . albuterol (VENTOLIN HFA) 108 (90 Base) MCG/ACT inhaler Inhale 2 puffs into the lungs every 6 (six) hours as needed. 18 g 5  . budesonide-formoterol (SYMBICORT)  160-4.5 MCG/ACT inhaler INHALE TWO PUFFS BY MOUTH FIRST THING IN THE MORNING AND THEN ANOTHER TWO PUFFS 12 HOURS LATER (Patient taking differently: Inhale 2 puffs into the lungs 2 (two) times daily. ) 10.2 g 11  . cetirizine (ZYRTEC) 10 MG tablet Take 10 mg by mouth daily as needed for allergies.     . finasteride (PROSCAR) 5 MG tablet Take 5 mg by mouth daily.    . fluticasone (FLONASE) 50 MCG/ACT nasal spray Place 2 sprays into both nostrils daily as needed for allergies.     Marland Kitchen guaiFENesin (MUCINEX) 600 MG 12 hr tablet Take 1 tablet (600 mg total) by mouth 2 (two) times daily as needed for to loosen phlegm.    . hydrochlorothiazide (HYDRODIURIL) 25 MG tablet Take 1 tablet (25 mg total) by mouth daily. 30 tablet 0  . losartan (COZAAR) 25 MG tablet Take 25 mg by mouth daily.    . Multiple Vitamin (MULTIVITAMIN) tablet Take 1 tablet by mouth daily.    . sodium chloride 0.9 % nebulizer solution Take 3-4 mLs by nebulization daily as needed. Use 3-4 drops into Trach to loosen phlegm suction secretions immediatly after.     . terazosin (HYTRIN) 5 MG capsule Take 5 mg by mouth at bedtime.     . triamcinolone cream (KENALOG) 0.1 % Apply 1 application topically daily as needed (rash).     Marland Kitchen docusate sodium (COLACE) 100 MG capsule Take 1 capsule (100 mg total) by mouth 2 (two) times daily as needed for mild constipation. (Patient not taking: Reported on 05/10/2020) 10 capsule 0  . doxycycline (VIBRAMYCIN) 100 MG capsule Take 1 capsule (100 mg total) by mouth 2 (two) times daily. One po bid x 7 days (Patient not taking: Reported on 05/10/2020) 14 capsule 0  . oxyCODONE (OXY IR/ROXICODONE) 5 MG immediate release tablet Take 1 tablet (5 mg total) by mouth every 6 (six) hours as needed for breakthrough pain. (Patient not taking: Reported on 05/10/2020) 15 tablet 0  . polyethylene glycol (MIRALAX / GLYCOLAX) 17 g packet Take 17 g by mouth daily as needed. (Patient not taking: Reported on 05/10/2020) 14 each 0  .  predniSONE (DELTASONE) 20 MG tablet 2 tabs po daily x 4 days (Patient not taking: Reported on 05/10/2020) 8 tablet 0  . traMADol (ULTRAM) 50 MG tablet Take 1 tablet (50 mg total) by mouth every 6 (six) hours as needed for severe pain. (Patient not taking: Reported on 05/10/2020) 15 tablet 0    No results found for this or any previous visit (from the past 48 hour(s)). No results found.  Review of Systems  Constitutional: Negative for chills and fever.  HENT: Negative for hearing loss.   Respiratory: Negative for cough.   Cardiovascular: Negative for chest pain and palpitations.  Gastrointestinal: Negative for abdominal pain, nausea and vomiting.  Genitourinary: Negative for dysuria and urgency.  Musculoskeletal: Negative for myalgias and neck pain.  Skin: Negative for rash.  Neurological: Negative for dizziness and headaches.  Hematological: Does not bruise/bleed easily.  Psychiatric/Behavioral: Negative for suicidal ideas.  Blood pressure 132/83, pulse 67, temperature 98 F (36.7 C), temperature source Temporal, resp. rate 18, height 5\' 5"  (1.651 m), weight 67.6 kg, SpO2 99 %. Physical Exam Vitals reviewed.  Constitutional:      Appearance: He is well-developed.  HENT:     Head: Normocephalic and atraumatic.  Eyes:     Conjunctiva/sclera: Conjunctivae normal.     Pupils: Pupils are equal, round, and reactive to light.  Cardiovascular:     Rate and Rhythm: Normal rate and regular rhythm.  Pulmonary:     Effort: Pulmonary effort is normal.     Breath sounds: Normal breath sounds.  Abdominal:     General: Bowel sounds are normal. There is no distension.     Palpations: Abdomen is soft.     Tenderness: There is no abdominal tenderness.     Comments: umbilical hernia  Musculoskeletal:        General: Normal range of motion.     Cervical back: Normal range of motion and neck supple.  Skin:    General: Skin is warm and dry.  Neurological:     Mental Status: He is alert and  oriented to person, place, and time.  Psychiatric:        Behavior: Behavior normal.      Assessment/Plan 69 yo male with symptomatic umbilical hernia -open umbilical hernia repair with likely mesh -planned outpatient procedure -ERAs protocol  Mickeal Skinner, MD 05/24/2020, 11:38 AM

## 2020-05-24 NOTE — Discharge Instructions (Signed)
[facility] _______[facility] ° °UMBILICAL OR INGUINAL HERNIA REPAIR: POST OP INSTRUCTIONS ° °Always review your discharge instruction sheet given to you by the facility where your surgery was performed. °IF YOU HAVE DISABILITY OR FAMILY LEAVE FORMS, YOU MUST BRING THEM TO THE OFFICE FOR PROCESSING.   °DO NOT GIVE THEM TO YOUR DOCTOR. ° °1. A  prescription for pain medication may be given to you upon discharge.  Take your pain medication as prescribed, if needed.  If narcotic pain medicine is not needed, then you may take acetaminophen (Tylenol) or ibuprofen (Advil) as needed. °2. Take your usually prescribed medications unless otherwise directed. °If you need a refill on your pain medication, please contact your pharmacy.  They will contact our office to request authorization. Prescriptions will not be filled after 5 pm or on week-ends. °3. You should follow a light diet the first 24 hours after arrival home, such as soup and crackers, etc.  Be sure to include lots of fluids daily.  Resume your normal diet the day after surgery. °4.Most patients will experience some swelling and bruising around the umbilicus or in the groin and scrotum.  Ice packs and reclining will help.  Swelling and bruising can take several days to resolve.  °6. It is common to experience some constipation if taking pain medication after surgery.  Increasing fluid intake and taking a stool softener (such as Colace) will usually help or prevent this problem from occurring.  A mild laxative (Milk of Magnesia or Miralax) should be taken according to package directions if there are no bowel movements after 48 hours. °7. Unless discharge instructions indicate otherwise, you may remove your bandages 24-48 hours after surgery, and you may shower at that time.  You may have steri-strips (small skin tapes) in place directly over the incision.  These strips should be left on the skin for 7-10 days.  If your surgeon used skin glue on the  incision, you may shower in 24 hours.  The glue will flake off over the next 2-3 weeks.  Any sutures or staples will be removed at the office during your follow-up visit. °8. ACTIVITIES:  You may resume regular (light) daily activities beginning the next day--such as daily self-care, walking, climbing stairs--gradually increasing activities as tolerated.  You may have sexual intercourse when it is comfortable.  Refrain from any heavy lifting or straining until approved by your doctor. ° °a.You may drive when you are no longer taking prescription pain medication, you can comfortably wear a seatbelt, and you can safely maneuver your car and apply brakes. °b.RETURN TO WORK:   °_____________________________________________ ° °9.You should see your doctor in the office for a follow-up appointment approximately 2-3 weeks after your surgery.  Make sure that you call for this appointment within a day or two after you arrive home to insure a convenient appointment time. °10.OTHER INSTRUCTIONS: _________________________ °   _____________________________________ ° °WHEN TO CALL YOUR DOCTOR: °1. Fever over 101.0 °2. Inability to urinate °3. Nausea and/or vomiting °4. Extreme swelling or bruising °5. Continued bleeding from incision. °6. Increased pain, redness, or drainage from the incision ° °The clinic staff is available to answer your questions during regular business hours.  Please don’t hesitate to call and ask to speak to one of the nurses for clinical concerns.  If you have a medical emergency, go to the nearest emergency room or call 911.  A surgeon from [facility] is always on call at the hospital ° ° °  [address], [address], [place], [place]  [address] ? ° [address] [address], [place], [place]   [address] °[phone] ? [phone] ? FAX [phone] °Web site: [email or web address] °

## 2020-05-24 NOTE — Transfer of Care (Signed)
Immediate Anesthesia Transfer of Care Note  Patient: [name] [name]  Procedure(s) Performed: OPEN UMBILICAL HERNIA REPAIR WITH  MESH (N/A Abdomen)  Patient Location: PACU  Anesthesia Type:General  Level of Consciousness: awake, alert  and oriented  Airway & Oxygen Therapy: Patient Spontanous Breathing  Post-op Assessment: Report given to RN and Post -op Vital signs reviewed and stable  Post vital signs: Reviewed and stable  Last Vitals:  Vitals Value Taken Time  BP 125/92 05/24/20 1255  Temp 36.5 C 05/24/20 1255  Pulse 65 05/24/20 1257  Resp 15 05/24/20 1257  SpO2 100 % 05/24/20 1257  Vitals shown include unvalidated device data.  Last Pain:  Vitals:   05/24/20 1255  TempSrc:   PainSc: 0-No pain         Complications: No complications documented.

## 2020-05-24 NOTE — Anesthesia Postprocedure Evaluation (Signed)
Anesthesia Post Note  Patient: [name] [name]  Procedure(s) Performed: OPEN UMBILICAL HERNIA REPAIR WITH  MESH (N/A Abdomen)     Patient location during evaluation: PACU Anesthesia Type: General Level of consciousness: awake and alert Pain management: pain level controlled Vital Signs Assessment: post-procedure vital signs reviewed and stable Respiratory status: spontaneous breathing, nonlabored ventilation, respiratory function stable and patient connected to nasal cannula oxygen Cardiovascular status: blood pressure returned to baseline and stable Postop Assessment: no apparent nausea or vomiting Anesthetic complications: no   No complications documented.  Last Vitals:  Vitals:   05/24/20 1300 05/24/20 1315  BP: (!) 125/92 118/79  Pulse: 64 (!) 58  Resp: 16 15  Temp:  36.5 C  SpO2: 97% 97%    Last Pain:  Vitals:   05/24/20 1315  TempSrc:   PainSc: 0-No pain                 Belenda Cruise P Nyeisha Goodall

## 2020-05-25 ENCOUNTER — Encounter (HOSPITAL_COMMUNITY): Payer: Self-pay | Admitting: General Surgery

## 2020-05-29 DIAGNOSIS — Z430 Encounter for attention to tracheostomy: Secondary | ICD-10-CM | POA: Diagnosis not present

## 2020-06-03 ENCOUNTER — Other Ambulatory Visit: Payer: Self-pay | Admitting: *Deleted

## 2020-06-03 NOTE — Patient Outreach (Signed)
[facility]) Care Management  [place]  06/03/2020   [name] [name] 06/14/1951 [id number]  Subjective: Successful telephone outreach call to patient. HIPAA identifiers obtained. Patient states he is doing fairly well. Patient explained for the last several days he has had increased SOB, increased mucous, he is using his rescue inhaler and nebulizer 3-4 times daily, he is using saline bullets routinely to moisten thick secretions to be able to suction trach. Nurse reviewed action plan and explained to the patient was in the yellow zone. Nurse encouraged patient to call his pulmonologist to report his symptoms. Patient verbalized understanding and stated he would call Dr. Gustavus [name] office. Patient also reports that his hernia repair surgery went well and that he has not had any complications recovering. Patient has not made his after surgery follow-up visit and nurse was able to review the after visit summary sheet with patient which stated for him to make an appointment with his surgeon in 3 weeks. Nurse gave the patient the surgeon's office number to call. Patient states he does have a good appetite and is not having any difficulty eating despite increased SOB. He explains that he does have everything he needs in his home environment to be safe and that he is well supported by his daughter [name] [name]. He confirmed that he does have this nurse's contact number and will call her if needed.   Encounter Medications:  Outpatient Encounter Medications as of 06/03/2020  Medication Sig Note   acetaminophen 325 MG tablet Take 2 tablets (650 mg total) by mouth every 6 (six) hours as needed.    albuterol (PROVENTIL) (2.5 MG/3ML) 0.083% nebulizer solution Take 3 mLs (2.5 mg total) by nebulization every 4 (four) hours as needed for wheezing or shortness of breath.    albuterol (VENTOLIN HFA) 108 (90 Base) MCG/ACT inhaler Inhale 2 puffs into the lungs every 6 (six) hours as needed.     budesonide-formoterol (SYMBICORT) 160-4.5 MCG/ACT inhaler INHALE TWO PUFFS BY MOUTH FIRST THING IN THE MORNING AND THEN ANOTHER TWO PUFFS 12 HOURS LATER (Patient taking differently: Inhale 2 puffs into the lungs 2 (two) times daily. )    cetirizine (ZYRTEC) 10 MG tablet Take 10 mg by mouth daily as needed for allergies.     docusate sodium (COLACE) 100 MG capsule Take 1 capsule (100 mg total) by mouth 2 (two) times daily as needed for mild constipation. (Patient not taking: Reported on 05/10/2020) 04/02/2020: Per patient not needed   finasteride (PROSCAR) 5 MG tablet Take 5 mg by mouth daily.    fluticasone (FLONASE) 50 MCG/ACT nasal spray Place 2 sprays into both nostrils daily as needed for allergies.     guaiFENesin (MUCINEX) 600 MG 12 hr tablet Take 1 tablet (600 mg total) by mouth 2 (two) times daily as needed for to loosen phlegm.    hydrochlorothiazide (HYDRODIURIL) 25 MG tablet Take 1 tablet (25 mg total) by mouth daily.    ibuprofen (ADVIL) 800 MG tablet Take 1 tablet (800 mg total) by mouth every 8 (eight) hours as needed.    losartan (COZAAR) 25 MG tablet Take 25 mg by mouth daily.    Multiple Vitamin (MULTIVITAMIN) tablet Take 1 tablet by mouth daily.    oxyCODONE (OXY IR/ROXICODONE) 5 MG immediate release tablet Take 1 tablet (5 mg total) by mouth every 6 (six) hours as needed for severe pain.    sodium chloride 0.9 % nebulizer solution Take 3-4 mLs by nebulization daily as needed. Use 3-4 drops into  Trach to loosen phlegm suction secretions immediatly after.     terazosin (HYTRIN) 5 MG capsule Take 5 mg by mouth at bedtime.     triamcinolone cream (KENALOG) 0.1 % Apply 1 application topically daily as needed (rash).     No facility-administered encounter medications on file as of 06/03/2020.    Functional Status:  In your present state of health, do you have any difficulty performing the following activities: 05/15/2020 04/02/2020  Hearing? N N  Vision? N N  Difficulty  concentrating or making decisions? N N  Walking or climbing stairs? N N  Comment - -  Dressing or bathing? N N  Comment - -  Doing errands, shopping? N [place] and eating ? - N  Comment - -  Using the Toilet? - N  In the past six months, have you accidently leaked urine? - N  Do you have problems with loss of bowel control? - N  Managing your Medications? - N  Managing your Finances? - N  Housekeeping or managing your Housekeeping? - N  Some recent data might be hidden    Fall/Depression Screening: Fall Risk  06/03/2020 04/02/2020 12/29/2019  Falls in the past year? 0 0 0  Number falls in past yr: 0 0 0  Injury with Fall? 0 0 0  Follow up Falls prevention discussed;Education provided;Falls evaluation completed Falls prevention discussed;Education provided;Falls evaluation completed -   PHQ 2/9 Scores 04/02/2020 12/29/2019 10/26/2019 07/26/2019 06/01/2019 03/02/2019 10/05/2018  PHQ - 2 Score 0 0 0 0 0 0 0   Goals Addressed            This Visit's Progress    Patient will not have any ED or hospital admission within the next 90 days       [place] (see longtitudinal plan of care for additional care plan information)  Current Barriers:   Knowledge deficits related to basic COPD self care/management   Case Manager Clinical Goal(s):  Over the next 60 days patient will report using inhalers as prescribed including rinsing mouth after use  Over the next 60 days patient will report utilizing pursed lip breathing for shortness of breath  Over the next 60 days, patient will be able to verbalize understanding of COPD action plan and when to seek appropriate levels of medical care  Over the next 60 days, patient will engage in lite exercise as tolerated to build/regain stamina and strength and reduce shortness of breath through activity tolerance  Over the next 60 days, patient will verbalize basic understanding of COPD disease process and self care  activities  Over the next 90 days, patient will not be hospitalized for COPD exacerbation  Patient reports that he is following COPD zones and action plans .   Interventions:   Provided patient with basic written and verbal COPD education on self care/management/and exacerbation prevention   Provided patient with COPD action plan and reinforced importance of daily self assessment  Provided written and verbal instructions on pursed lip breathing and utilized returned demonstration as teach back  Advised patient to self assesses COPD action plan zone and make appointment with provider if in the yellow zone for 48 hours without improvement.  Provided patient with education about the role of exercise in the management of COPD  Advised patient to engage in light exercise as tolerated 3-5 days a week  Provided education about and advised patient to utilize infection prevention strategies to reduce risk of  respiratory infection   Patient explained for the last  several days he has had increased SOB, increased mucous, he is using his rescue inhaler and nebulizer 3-4 times daily, he is using saline bullets routinely to moisten secretions to be able to suction trach. Nurse reviewed action plan and explained the patient was in the yellow zone. Nurse encouraged patient to call his pulmonologist to report symptoms. Patient verbalized understanding and stated he would call Dr. Gustavus [name] office.  Nurse encouraged patient to get his annual flu vaccination  Patient Self Care Activities:   Takes medications as prescribed including inhalers  Self assesses COPD action plan zone and makes appointment with provider if in the yellow zone for 48 hours without improvement.  Engages in light exercise 3-5 days a week  Utilizes infection prevention strategies to reduce risk of respiratory infection   Does not currently perform self care activities related to COPD management   Please see past updates related  to this goal by clicking on the "Past Updates" button in the selected goal          Plan: [place] will call patient within the month of October and patient agrees to future outreach calls.   Emelia Loron RN, BSN [place] [phone] [email or web address]

## 2020-06-05 DIAGNOSIS — Z430 Encounter for attention to tracheostomy: Secondary | ICD-10-CM | POA: Diagnosis not present

## 2020-06-05 DIAGNOSIS — Z930 Tracheostomy status: Secondary | ICD-10-CM | POA: Diagnosis not present

## 2020-06-05 DIAGNOSIS — J3802 Paralysis of vocal cords and larynx, bilateral: Secondary | ICD-10-CM | POA: Diagnosis not present

## 2020-06-06 ENCOUNTER — Telehealth: Payer: Self-pay | Admitting: Internal Medicine

## 2020-06-06 DIAGNOSIS — R918 Other nonspecific abnormal finding of lung field: Secondary | ICD-10-CM

## 2020-06-06 MED ORDER — PREDNISONE 10 MG PO TABS: ORAL_TABLET | ORAL | 0 refills | Status: DC

## 2020-06-06 MED ORDER — AZITHROMYCIN 250 MG PO TABS: ORAL_TABLET | ORAL | 0 refills | Status: DC

## 2020-06-06 NOTE — Telephone Encounter (Signed)
Prednisone 10 mg take  4 each am x 2 days,   2 each am x 2 days,  1 each am x 2 days and stop  zpak   Be sure using mucinex max dose = 1200 mg every 12 hours and whatever measures ENT recommends to deliver moisture to his trach

## 2020-06-06 NOTE — Telephone Encounter (Signed)
Spoke with patient regarding prior message. Advised patient Dr.Wert ok to send in Prednisone 10 mg take  4 each am x 2 days,   2 each am x 2 days,  1 each am x 2 days and stop  zpak   Be sure using mucinex max dose = 1200 mg every 12 hours and whatever measures ENT recommends to deliver moisture to his trach.  Patient is aware of medication sent into pharmacy and voice was understanding. Nothing else further needed.
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
## 2020-06-11 DIAGNOSIS — I10 Essential (primary) hypertension: Secondary | ICD-10-CM | POA: Diagnosis not present

## 2020-06-11 DIAGNOSIS — J449 Chronic obstructive pulmonary disease, unspecified: Secondary | ICD-10-CM | POA: Diagnosis not present

## 2020-06-11 DIAGNOSIS — E785 Hyperlipidemia, unspecified: Secondary | ICD-10-CM | POA: Diagnosis not present

## 2020-06-11 DIAGNOSIS — J4530 Mild persistent asthma, uncomplicated: Secondary | ICD-10-CM | POA: Diagnosis not present

## 2020-06-14 ENCOUNTER — Other Ambulatory Visit: Payer: Self-pay | Admitting: *Deleted

## 2020-06-14 NOTE — Patient Outreach (Signed)
[facility]) Care Management  06/14/2020  [name] [name] 11/09/1950 [id number]  Unsuccessful outreach attempt made to patient. RN Health Coach left HIPAA compliant voicemail message along with her contact information.  Plan: RN Health Coach will call patient within the month of November  [place], [place] [phone] [name]

## 2020-06-20 DIAGNOSIS — Z430 Encounter for attention to tracheostomy: Secondary | ICD-10-CM | POA: Diagnosis not present

## 2020-06-20 DIAGNOSIS — J3802 Paralysis of vocal cords and larynx, bilateral: Secondary | ICD-10-CM | POA: Diagnosis not present

## 2020-06-20 DIAGNOSIS — J386 Stenosis of larynx: Secondary | ICD-10-CM | POA: Diagnosis not present

## 2020-06-20 DIAGNOSIS — J441 Chronic obstructive pulmonary disease with (acute) exacerbation: Secondary | ICD-10-CM | POA: Diagnosis not present

## 2020-06-20 DIAGNOSIS — Z930 Tracheostomy status: Secondary | ICD-10-CM | POA: Diagnosis not present

## 2020-06-20 DIAGNOSIS — D141 Benign neoplasm of larynx: Secondary | ICD-10-CM | POA: Diagnosis not present

## 2020-06-21 IMAGING — DX DG NECK SOFT TISSUE
1 series · 1 of 1 positions shown · non-contrast
Comparison: None.

CLINICAL DATA: Shortness of breath.

EXAM:
NECK SOFT TISSUES - 1+ VIEW

[neck lat]
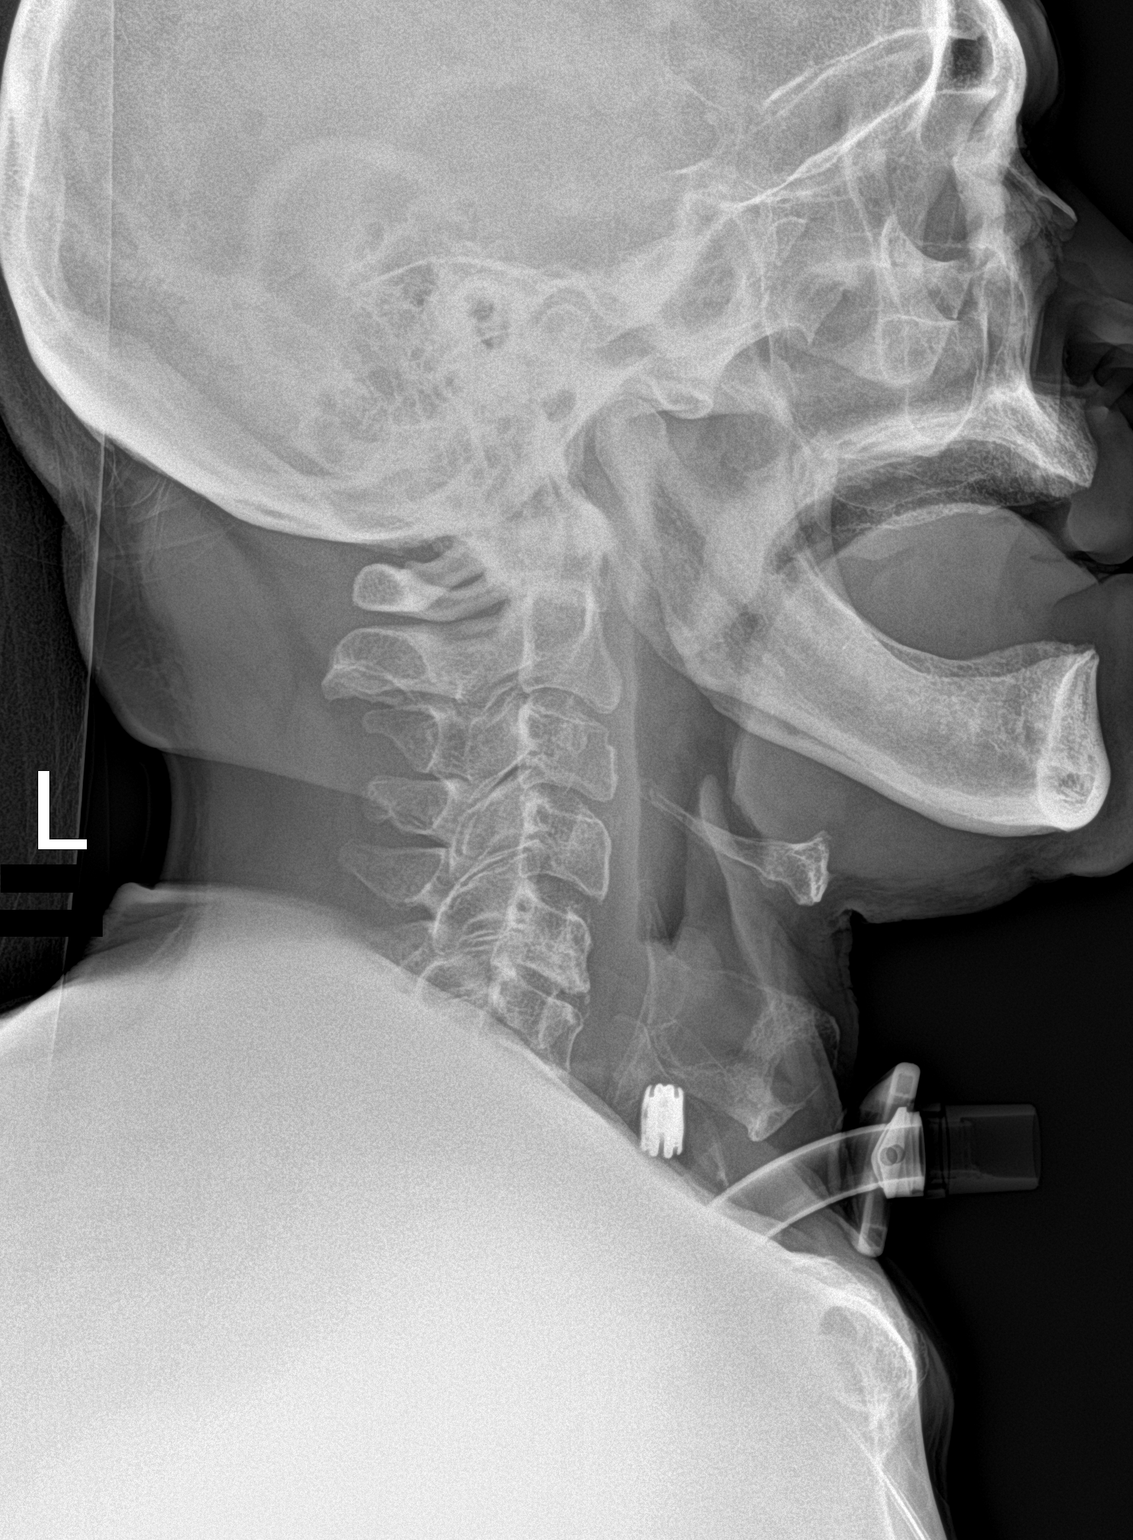

[1 of 1 positions shown; findings below may reference images not displayed]

FINDINGS: There is no evidence of retropharyngeal soft tissue swelling or
epiglottic enlargement. The cervical airway is unremarkable and no
radio-opaque foreign body identified. Mild degenerative change of
the spine with disc space narrowing at the C5-6 level.
IMPRESSION: No acute soft tissue abnormality.

## 2020-06-21 IMAGING — DX DG CHEST 1V PORT
1 series · 1 of 1 positions shown · non-contrast
Comparison: Portable exam 7167 hours compared to 07/27/2018

CLINICAL DATA: Shortness of breath, history COPD, hypertension

EXAM:
PORTABLE CHEST 1 VIEW

[chest ap]
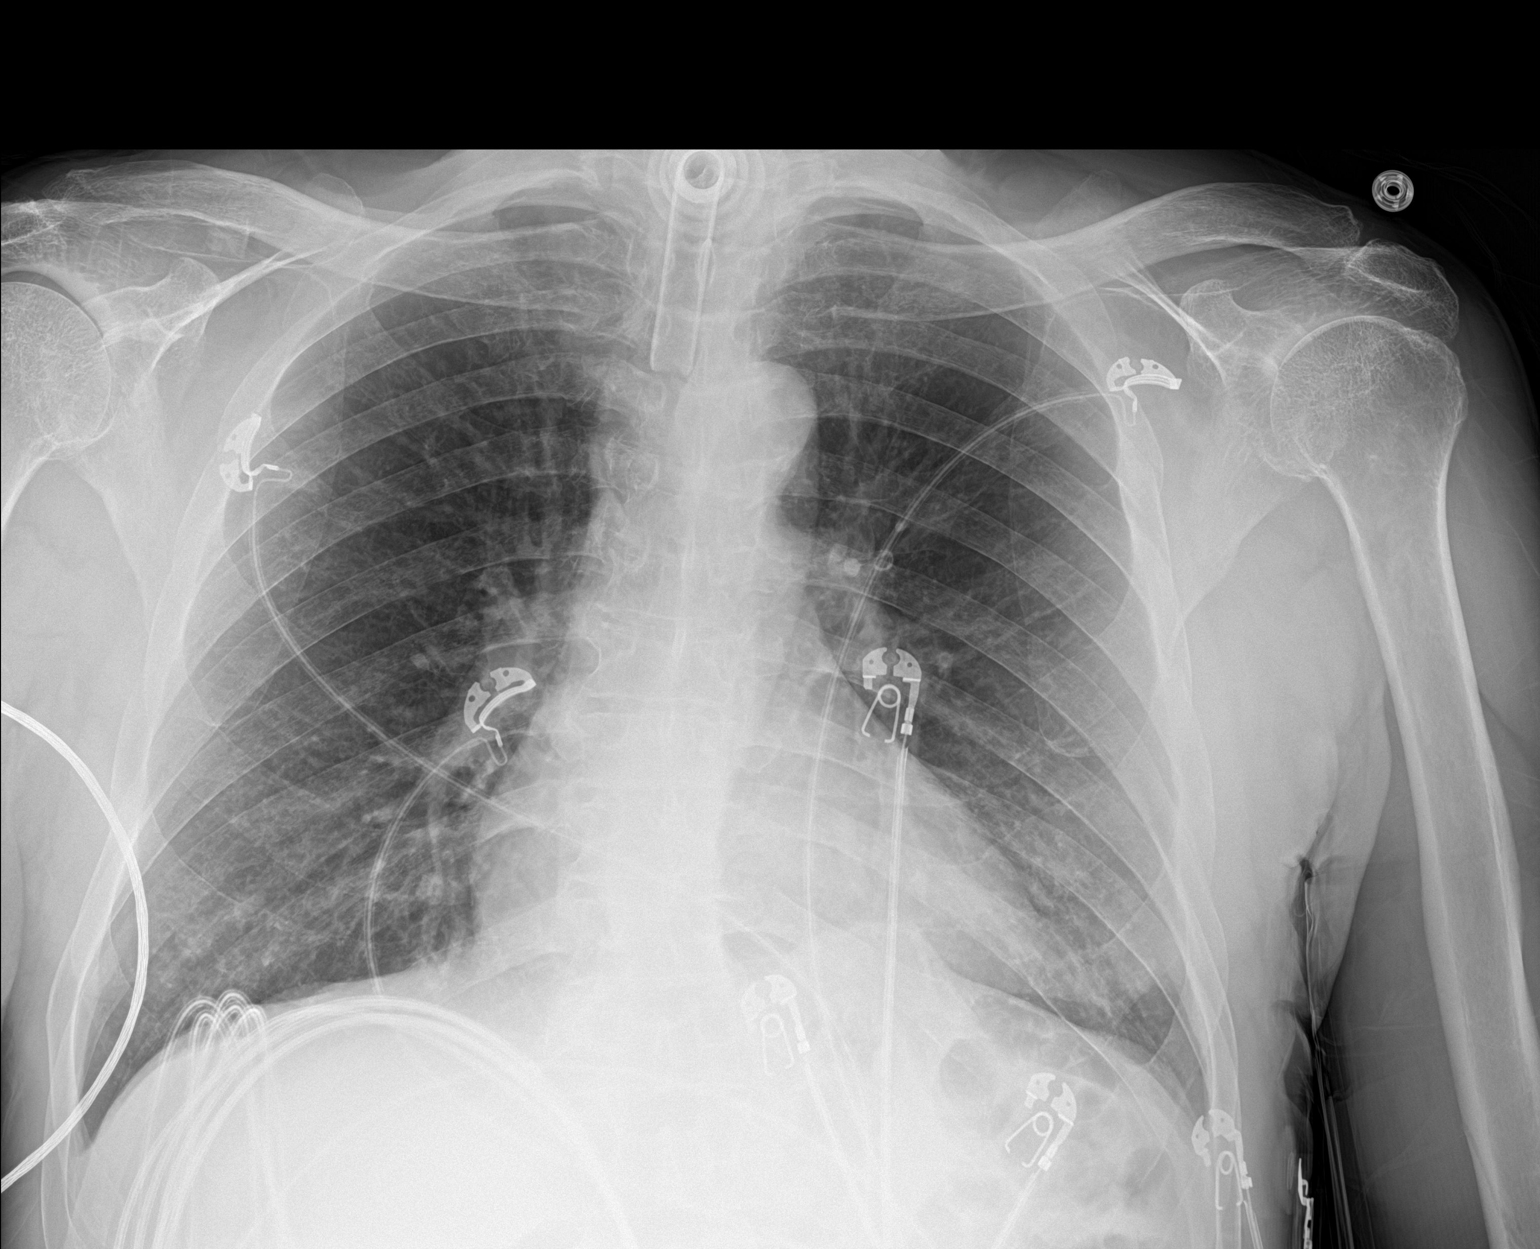

[1 of 1 positions shown; findings below may reference images not displayed]

FINDINGS: Tracheostomy tube stable.

Normal heart size, mediastinal contours, and pulmonary vascularity.

Eventration LEFT diaphragm stable.

Lungs clear.

No infiltrate, pleural effusion or pneumothorax.

Bones demineralized.
IMPRESSION: No acute abnormalities.

## 2020-07-01 DIAGNOSIS — Z430 Encounter for attention to tracheostomy: Secondary | ICD-10-CM | POA: Diagnosis not present

## 2020-07-05 DIAGNOSIS — Z930 Tracheostomy status: Secondary | ICD-10-CM | POA: Diagnosis not present

## 2020-07-15 ENCOUNTER — Other Ambulatory Visit: Payer: Self-pay | Admitting: Gastroenterology

## 2020-07-16 IMAGING — DX DG CHEST 1V PORT
1 series · 1 of 1 positions shown · non-contrast
Comparison: 09/07/2018

CLINICAL DATA: Shortness of breath and chest pain.

EXAM:
PORTABLE CHEST 1 VIEW

[chest]
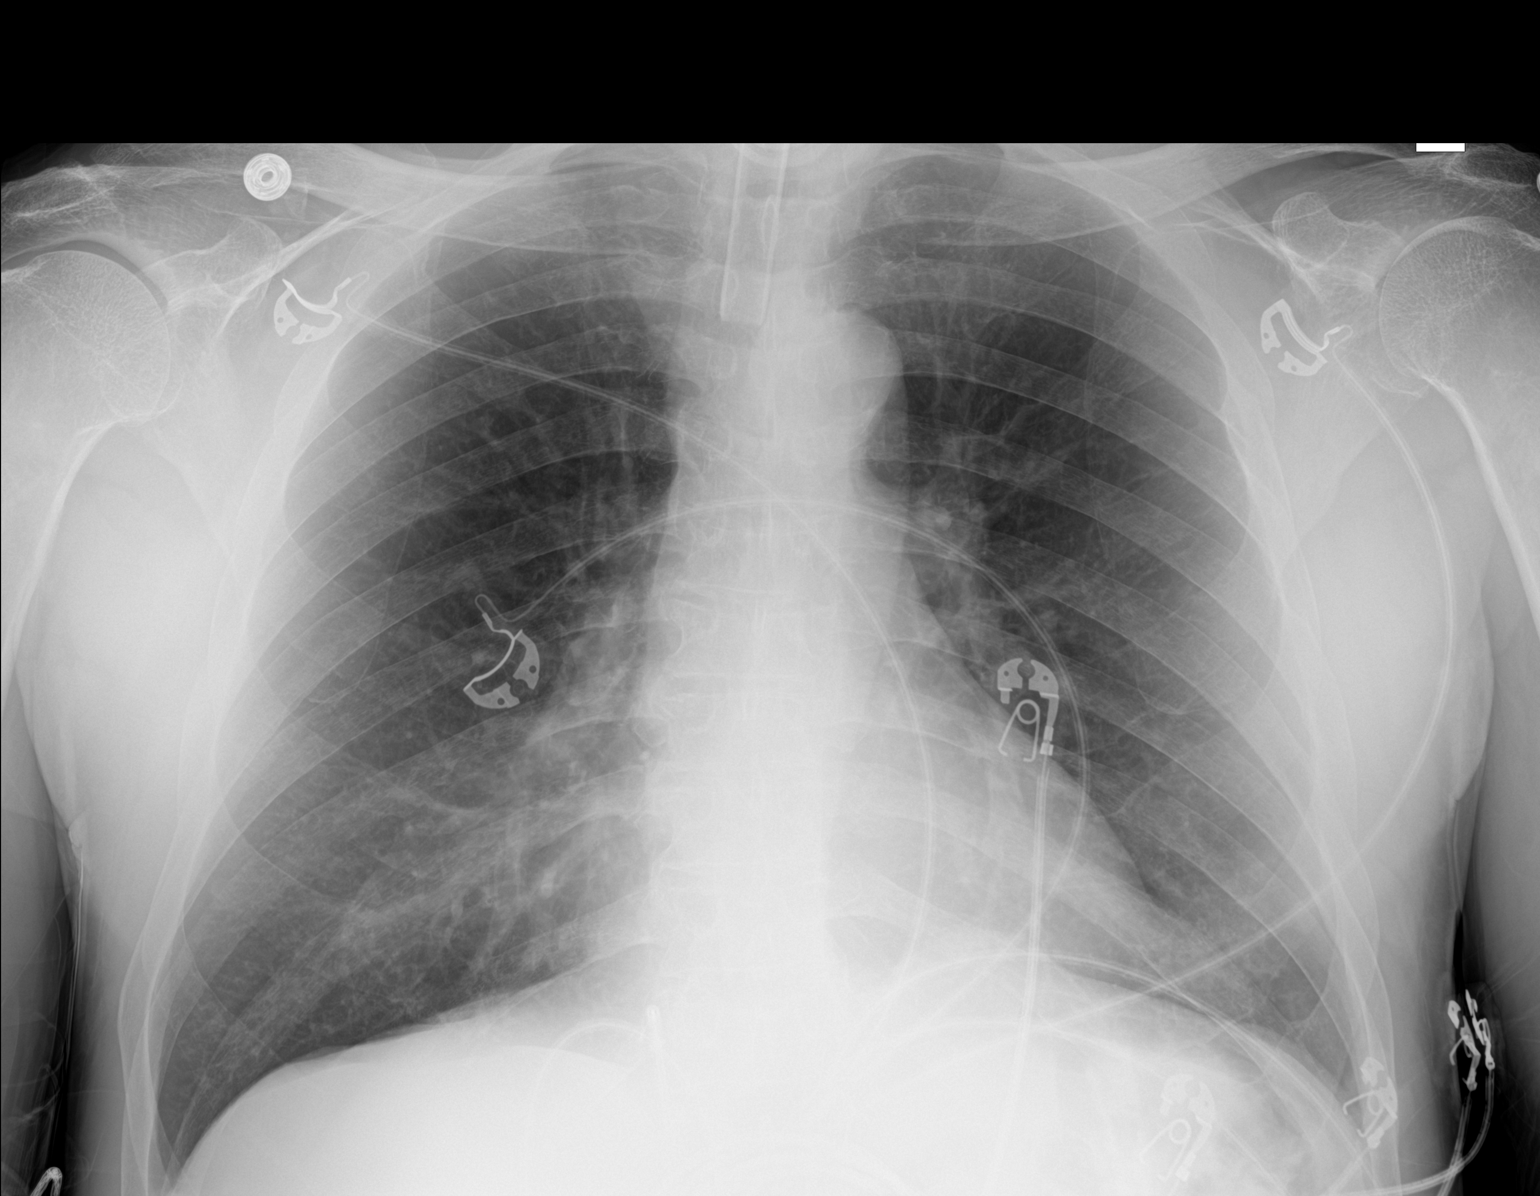

[1 of 1 positions shown; findings below may reference images not displayed]

FINDINGS: A tracheostomy tube is present. Heart size is normal. Mild haziness
in the right hilar region but no evidence for pulmonary edema or
focal airspace disease. Negative for a pneumothorax. Bone structures
are unremarkable.
IMPRESSION: No active disease.

## 2020-07-17 ENCOUNTER — Other Ambulatory Visit: Payer: Self-pay | Admitting: *Deleted

## 2020-07-17 NOTE — Patient Outreach (Signed)
[facility]) Care Management  [place]  07/17/2020   [name] [name] 1950-11-12 [id number]  Subjective: Successful telephone outreach call to patient. HIPAA identifiers obtained. Patient reports he is doing well. He recently had his trach changed on 06/20/20. Patient feels like his COPD is controlled at this time. Patient explains that he is at baseline with his breathing and continues to use saline bullets up to 3 times daily to loosen thick mucous and suction his trach. Patient continues to exercise by walking daily and he is supported by his family and daughter [name] [name]. Patient denies having any recent falls and reports that he is very satisfied with his quality of life. He has received his annual flu shot and states he will call [facility] to make an appointment to schedule his covid booster shot. Patient did confirm that he has this nurse's contact number to call if needed.   Encounter Medications:  Outpatient Encounter Medications as of 07/17/2020  Medication Sig Note   acetaminophen 325 MG tablet Take 2 tablets (650 mg total) by mouth every 6 (six) hours as needed.    albuterol (PROVENTIL) (2.5 MG/3ML) 0.083% nebulizer solution Take 3 mLs (2.5 mg total) by nebulization every 4 (four) hours as needed for wheezing or shortness of breath.    albuterol (VENTOLIN HFA) 108 (90 Base) MCG/ACT inhaler Inhale 2 puffs into the lungs every 6 (six) hours as needed.    budesonide-formoterol (SYMBICORT) 160-4.5 MCG/ACT inhaler INHALE TWO PUFFS BY MOUTH FIRST THING IN THE MORNING AND THEN ANOTHER TWO PUFFS 12 HOURS LATER (Patient taking differently: Inhale 2 puffs into the lungs 2 (two) times daily. )    cetirizine (ZYRTEC) 10 MG tablet Take 10 mg by mouth daily as needed for allergies.     finasteride (PROSCAR) 5 MG tablet Take 5 mg by mouth daily.    fluticasone (FLONASE) 50 MCG/ACT nasal spray Place 2 sprays into both nostrils daily as needed for allergies.      guaiFENesin (MUCINEX) 600 MG 12 hr tablet Take 1 tablet (600 mg total) by mouth 2 (two) times daily as needed for to loosen phlegm.    hydrochlorothiazide (HYDRODIURIL) 25 MG tablet Take 1 tablet (25 mg total) by mouth daily.    ibuprofen (ADVIL) 800 MG tablet Take 1 tablet (800 mg total) by mouth every 8 (eight) hours as needed.    losartan (COZAAR) 25 MG tablet Take 25 mg by mouth daily.    Multiple Vitamin (MULTIVITAMIN) tablet Take 1 tablet by mouth daily.    sodium chloride 0.9 % nebulizer solution Take 3-4 mLs by nebulization daily as needed. Use 3-4 drops into Trach to loosen phlegm suction secretions immediatly after.     terazosin (HYTRIN) 5 MG capsule Take 5 mg by mouth at bedtime.     azithromycin (ZITHROMAX) 250 MG tablet 2 tablet's 1st day and 1 tablet daily until done. (Patient not taking: Reported on 07/17/2020) 07/17/2020: completed   docusate sodium (COLACE) 100 MG capsule Take 1 capsule (100 mg total) by mouth 2 (two) times daily as needed for mild constipation. (Patient not taking: Reported on 05/10/2020) 07/17/2020: Not needed   oxyCODONE (OXY IR/ROXICODONE) 5 MG immediate release tablet Take 1 tablet (5 mg total) by mouth every 6 (six) hours as needed for severe pain. (Patient not taking: Reported on 07/17/2020) 07/17/2020: completed   predniSONE (DELTASONE) 10 MG tablet 4 each am x2 days,2 each am  x2 days,1 each am x2 days and stop. (Patient not taking: Reported on  07/17/2020) 07/17/2020: completed   triamcinolone cream (KENALOG) 0.1 % Apply 1 application topically daily as needed (rash).  (Patient not taking: Reported on 07/17/2020) 07/17/2020: Not needed, completed   No facility-administered encounter medications on file as of 07/17/2020.    Functional Status:  In your present state of health, do you have any difficulty performing the following activities: 05/15/2020 04/02/2020  Hearing? N N  Vision? N N  Difficulty concentrating or making decisions? N N  Walking or  climbing stairs? N N  Comment - -  Dressing or bathing? N N  Comment - -  Doing errands, shopping? N [place] and eating ? - N  Comment - -  Using the Toilet? - N  In the past six months, have you accidently leaked urine? - N  Do you have problems with loss of bowel control? - N  Managing your Medications? - N  Managing your Finances? - N  Housekeeping or managing your Housekeeping? - N  Some recent data might be hidden    Fall/Depression Screening: Fall Risk  07/17/2020 07/17/2020 06/03/2020  Falls in the past year? 0 0 0  Number falls in past yr: 0 0 0  Injury with Fall? 0 0 0  Follow up Falls prevention discussed;Education provided;Falls evaluation completed Falls prevention discussed;Education provided;Falls evaluation completed Falls prevention discussed;Education provided;Falls evaluation completed   PHQ 2/9 Scores 07/17/2020 04/02/2020 12/29/2019 10/26/2019 07/26/2019 06/01/2019 03/02/2019  PHQ - 2 Score 0 0 0 0 0 0 0    Assessment:  Goals Addressed            This Visit's Progress    Learn More About My Health       Follow Up Date 09/12/20    - tell my story and reason for my visit - make a list of questions - ask questions - repeat what I heard to make sure I understand - bring a list of my medicines to the visit - speak up when I don't understand    Why is this important?   The best way to learn about your health and care is by talking to the doctor and nurse.  They will answer your questions and give you information in the way that you like best.    Notes: Patient states that he does not read at a high level.      Patient will not have any ED or hospital admission within the next 90 days   On track    [place] (see longtitudinal plan of care for additional care plan information)  Current Barriers:   Knowledge deficits related to basic COPD self care/management   Case Manager Clinical Goal(s):  Over the next 60 days patient  will report using inhalers as prescribed including rinsing mouth after use  Over the next 60 days patient will report utilizing pursed lip breathing for shortness of breath  Over the next 60 days, patient will be able to verbalize understanding of COPD action plan and when to seek appropriate levels of medical care  Over the next 60 days, patient will engage in lite exercise as tolerated to build/regain stamina and strength and reduce shortness of breath through activity tolerance  Over the next 60 days, patient will verbalize basic understanding of COPD disease process and self care activities  Over the next 90 days, patient will not be hospitalized for COPD exacerbation  Patient reports that he is following COPD zones and action plans .  Interventions:   Provided patient with basic written and verbal COPD education on self care/management/and exacerbation prevention   Provided patient with COPD action plan and reinforced importance of daily self assessment  Provided written and verbal instructions on pursed lip breathing and utilized returned demonstration as teach back  Advised patient to self assesses COPD action plan zone and make appointment with provider if in the yellow zone for 48 hours without improvement.  Provided patient with education about the role of exercise in the management of COPD  Advised patient to engage in light exercise as tolerated 3-5 days a week  Provided education about and advised patient to utilize infection prevention strategies to reduce risk of respiratory infection   Patient Self Care Activities:   Takes medications as prescribed including inhalers  Self assesses COPD action plan zone and makes appointment with provider if in the yellow zone for 48 hours without improvement.  Engages in light exercise 3-5 days a week  Utilizes infection prevention strategies to reduce risk of respiratory infection   Patient manages his trach without  difficulty.     Patient has received this years annual flu shot and states he will schedule covid booster vaccination.   Please see past updates related to this goal by clicking on the "Past Updates" button in the selected goal    Updated: 111/3/21      Track and Manage My Symptoms       Follow Up Date 09/12/20    - develop a rescue plan - eliminate symptom triggers at home - follow rescue plan if symptoms flare-up - keep follow-up appointments    Why is this important?   Tracking your symptoms and other information about your health helps your doctor plan your care.  Write down the symptoms, the time of day, what you were doing and what medicine you are taking.  You will soon learn how to manage your symptoms.     Notes: Patient monitors his symptoms closely and follows his rescue plan. He manages his trach without difficulty.     Track and Manage My Triggers       Follow Up Date 09/12/20    - avoid second hand smoke - identify and remove indoor air pollutants - limit outdoor activity during cold weather - listen for public air quality announcements every day    Why is this important?   Triggers are activities or things, like tobacco smoke or cold weather, that make your COPD (chronic obstructive pulmonary disease) flare-up.  Knowing these triggers helps you plan how to stay away from them.  When you cannot remove them, you can learn how to manage them.     Notes:       Plan: RN Health Coach will send PCP today's assessment note, will send patient [employer] coupons, will call patient within the month of December and patient agrees to future outreach calls.   Emelia Loron RN, BSN [place] [phone] [email or web address]

## 2020-08-01 DIAGNOSIS — J449 Chronic obstructive pulmonary disease, unspecified: Secondary | ICD-10-CM | POA: Diagnosis not present

## 2020-08-01 DIAGNOSIS — Z430 Encounter for attention to tracheostomy: Secondary | ICD-10-CM | POA: Diagnosis not present

## 2020-08-01 DIAGNOSIS — K219 Gastro-esophageal reflux disease without esophagitis: Secondary | ICD-10-CM | POA: Diagnosis not present

## 2020-08-01 DIAGNOSIS — E785 Hyperlipidemia, unspecified: Secondary | ICD-10-CM | POA: Diagnosis not present

## 2020-08-01 DIAGNOSIS — J4530 Mild persistent asthma, uncomplicated: Secondary | ICD-10-CM | POA: Diagnosis not present

## 2020-08-05 ENCOUNTER — Other Ambulatory Visit: Payer: Self-pay | Admitting: Internal Medicine

## 2020-08-06 DIAGNOSIS — Z930 Tracheostomy status: Secondary | ICD-10-CM | POA: Diagnosis not present

## 2020-08-14 ENCOUNTER — Other Ambulatory Visit: Payer: Self-pay

## 2020-08-16 ENCOUNTER — Other Ambulatory Visit (HOSPITAL_COMMUNITY)
Admission: RE | Admit: 2020-08-16 | Discharge: 2020-08-16 | Disposition: A | Discharge status: Home / Self Care | Payer: Medicare Other | Source: Ambulatory Visit | Attending: Gastroenterology | Admitting: Gastroenterology

## 2020-08-16 DIAGNOSIS — Z20822 Contact with and (suspected) exposure to covid-19: Secondary | ICD-10-CM | POA: Insufficient documentation

## 2020-08-16 DIAGNOSIS — Z01812 Encounter for preprocedural laboratory examination: Secondary | ICD-10-CM | POA: Diagnosis not present

## 2020-08-16 LAB — SARS CORONAVIRUS 2 (TAT 6-24 HRS): SARS Coronavirus 2: NEGATIVE

## 2020-08-20 ENCOUNTER — Other Ambulatory Visit: Payer: Self-pay

## 2020-08-20 ENCOUNTER — Encounter (HOSPITAL_COMMUNITY): Payer: Self-pay | Admitting: Gastroenterology

## 2020-08-20 ENCOUNTER — Ambulatory Visit (HOSPITAL_COMMUNITY): Payer: Medicare Other | Admitting: Certified Registered Nurse Anesthetist

## 2020-08-20 ENCOUNTER — Ambulatory Visit (HOSPITAL_COMMUNITY)
Admission: RE | Admit: 2020-08-20 | Discharge: 2020-08-20 | Disposition: A | Discharge status: Home / Self Care | Payer: Medicare Other | Attending: Gastroenterology | Admitting: Gastroenterology

## 2020-08-20 ENCOUNTER — Encounter (HOSPITAL_COMMUNITY)
Admission: RE | Disposition: A | Discharge status: Home / Self Care | Payer: Self-pay | Source: Home / Self Care | Attending: Gastroenterology

## 2020-08-20 DIAGNOSIS — K648 Other hemorrhoids: Secondary | ICD-10-CM | POA: Insufficient documentation

## 2020-08-20 DIAGNOSIS — Z79899 Other long term (current) drug therapy: Secondary | ICD-10-CM | POA: Diagnosis not present

## 2020-08-20 DIAGNOSIS — Z86010 Personal history of colonic polyps: Secondary | ICD-10-CM | POA: Diagnosis not present

## 2020-08-20 DIAGNOSIS — Z09 Encounter for follow-up examination after completed treatment for conditions other than malignant neoplasm: Secondary | ICD-10-CM | POA: Diagnosis not present

## 2020-08-20 DIAGNOSIS — K5730 Diverticulosis of large intestine without perforation or abscess without bleeding: Secondary | ICD-10-CM | POA: Diagnosis not present

## 2020-08-20 DIAGNOSIS — I10 Essential (primary) hypertension: Secondary | ICD-10-CM | POA: Diagnosis not present

## 2020-08-20 DIAGNOSIS — K635 Polyp of colon: Secondary | ICD-10-CM | POA: Diagnosis not present

## 2020-08-20 DIAGNOSIS — J9601 Acute respiratory failure with hypoxia: Secondary | ICD-10-CM | POA: Diagnosis not present

## 2020-08-20 DIAGNOSIS — D122 Benign neoplasm of ascending colon: Secondary | ICD-10-CM | POA: Insufficient documentation

## 2020-08-20 HISTORY — PX: COLONOSCOPY WITH PROPOFOL: SHX5780

## 2020-08-20 HISTORY — PX: BIOPSY: SHX5522

## 2020-08-20 HISTORY — PX: POLYPECTOMY: SHX5525

## 2020-08-20 SURGERY — COLONOSCOPY WITH PROPOFOL
Anesthesia: Monitor Anesthesia Care

## 2020-08-20 MED ORDER — PROPOFOL 500 MG/50ML IV EMUL
INTRAVENOUS | Status: DC | PRN
  Administered 2020-08-20: 125 ug/kg/min via INTRAVENOUS

## 2020-08-20 MED ORDER — LACTATED RINGERS IV SOLN: INTRAVENOUS | Status: DC

## 2020-08-20 MED ORDER — LACTATED RINGERS IV SOLN: INTRAVENOUS | Status: DC | PRN

## 2020-08-20 MED ORDER — SODIUM CHLORIDE 0.9 % IV SOLN: INTRAVENOUS | Status: DC

## 2020-08-20 MED ORDER — PHENYLEPHRINE 40 MCG/ML (10ML) SYRINGE FOR IV PUSH (FOR BLOOD PRESSURE SUPPORT)
PREFILLED_SYRINGE | INTRAVENOUS | Status: DC | PRN
  Administered 2020-08-20 (×3): 80 ug via INTRAVENOUS

## 2020-08-20 MED ORDER — PROPOFOL 10 MG/ML IV BOLUS
INTRAVENOUS | Status: DC | PRN
  Administered 2020-08-20 (×4): 30 mg via INTRAVENOUS

## 2020-08-20 SURGICAL SUPPLY — 22 items

## 2020-08-20 NOTE — Discharge Instructions (Signed)
Monitored Anesthesia Care, Care After These instructions provide you with information about caring for yourself after your procedure. Your health care provider may also give you more specific instructions. Your treatment has been planned according to current medical practices, but problems sometimes occur. Call your health care provider if you have any problems or questions after your procedure. What can I expect after the procedure? After your procedure, you may:  Feel sleepy for several hours.  Feel clumsy and have poor balance for several hours.  Feel forgetful about what happened after the procedure.  Have poor judgment for several hours.  Feel nauseous or vomit.  Have a sore throat if you had a breathing tube during the procedure. Follow these instructions at home: For at least 24 hours after the procedure:      Have a responsible adult stay with you. It is important to have someone help care for you until you are awake and alert.  Rest as needed.  Do not: ? Participate in activities in which you could fall or become injured. ? Drive. ? Use heavy machinery. ? Drink alcohol. ? Take sleeping pills or medicines that cause drowsiness. ? Make important decisions or sign legal documents. ? Take care of children on your own. Eating and drinking  Follow the diet that is recommended by your health care provider.  If you vomit, drink water, juice, or soup when you can drink without vomiting.  Make sure you have little or no nausea before eating solid foods. General instructions  Take over-the-counter and prescription medicines only as told by your health care provider.  If you have sleep apnea, surgery and certain medicines can increase your risk for breathing problems. Follow instructions from your health care provider about wearing your sleep device: ? Anytime you are sleeping, including during daytime naps. ? While taking prescription pain medicines, sleeping medicines,  or medicines that make you drowsy.  If you smoke, do not smoke without supervision.  Keep all follow-up visits as told by your health care provider. This is important. Contact a health care provider if:  You keep feeling nauseous or you keep vomiting.  You feel light-headed.  You develop a rash.  You have a fever. Get help right away if:  You have trouble breathing. Summary  For several hours after your procedure, you may feel sleepy and have poor judgment.  Have a responsible adult stay with you for at least 24 hours or until you are awake and alert. This information is not intended to replace advice given to you by your health care provider. Make sure you discuss any questions you have with your health care provider. Document Revised: 11/29/2017 Document Reviewed: 12/22/2015 Elsevier Patient Education  [place]. Colonoscopy, Adult, Care After This sheet gives you information about how to care for yourself after your procedure. Your doctor may also give you more specific instructions. If you have problems or questions, call your doctor. What can I expect after the procedure? After the procedure, it is common to have:  A small amount of blood in your poop (stool) for 24 hours.  Some gas.  Mild cramping or bloating in your belly (abdomen). Follow these instructions at home: Eating and drinking   Drink enough fluid to keep your pee (urine) pale yellow.  Follow instructions from your doctor about what you cannot eat or drink.  Return to your normal diet as told by your doctor. Avoid heavy or fried foods that are hard to digest. Activity  Rest as told by your doctor.  Do not sit for a long time without moving. Get up to take short walks every 1-2 hours. This is important. Ask for help if you feel weak or unsteady.  Return to your normal activities as told by your doctor. Ask your doctor what activities are safe for you. To help cramping and bloating:   Try  walking around.  Put heat on your belly as told by your doctor. Use the heat source that your doctor recommends, such as a moist heat pack or a heating pad. ? Put a towel between your skin and the heat source. ? Leave the heat on for 20-30 minutes. ? Remove the heat if your skin turns bright red. This is very important if you are unable to feel pain, heat, or cold. You may have a greater risk of getting burned. General instructions  For the first 24 hours after the procedure: ? Do not drive or use machinery. ? Do not sign important documents. ? Do not drink alcohol. ? Do your daily activities more slowly than normal. ? Eat foods that are soft and easy to digest.  Take over-the-counter or prescription medicines only as told by your doctor.  Keep all follow-up visits as told by your doctor. This is important. Contact a doctor if:  You have blood in your poop 2-3 days after the procedure. Get help right away if:  You have more than a small amount of blood in your poop.  You see large clumps of tissue (blood clots) in your poop.  Your belly is swollen.  You feel like you may vomit (nauseous).  You vomit.  You have a fever.  You have belly pain that gets worse, and medicine does not help your pain. Summary  After the procedure, it is common to have a small amount of blood in your poop. You may also have mild cramping and bloating in your belly.  For the first 24 hours after the procedure, do not drive or use machinery, do not sign important documents, and do not drink alcohol.  Get help right away if you have a lot of blood in your poop, feel like you may vomit, have a fever, or have more belly pain. This information is not intended to replace advice given to you by your health care provider. Make sure you discuss any questions you have with your health care provider. Document Revised: 03/27/2019 Document Reviewed: 03/27/2019 Elsevier Patient Education  [place] After This sheet gives you information about how to care for yourself after your procedure. Your health care provider may also give you more specific instructions. If you have problems or questions, contact your health care provider. What can I expect after the procedure? After the procedure, it is common to have:  A small amount of blood in your stool for 24 hours after the procedure.  Some gas.  Mild cramping or bloating in your abdomen. Follow these instructions at home: General instructions  For the first 24 hours after the procedure: ? Do not drive or use machinery. ? Do not sign important documents. ? Do not drink alcohol. ? Do your regular daily activities at a slower pace than normal. ? Eat soft, easy-to-digest foods. ? Rest often.  Take over-the-counter or prescription medicines only as told by your health care provider.  Keep all follow-up visits as told by your health care provider. This is important. Relieving cramping and bloating   Try  walking around when you have cramps or feel bloated.  Put heat on your abdomen as told by your health care provider. Use a heat source that your health care provider recommends, such as a moist heat pack or a heating pad. ? Place a towel between your skin and the heat source. ? Leave the heat on for 20-30 minutes. ? Remove the heat if your skin turns bright red. This is especially important if you are unable to feel pain, heat, or cold. You may have a greater risk of getting burned. Eating and drinking  Drink enough fluid to keep your urine pale yellow.  Return to your normal diet as instructed by your health care provider. Avoid heavy or fried foods that are hard to digest.  Avoid drinking alcohol for as long as told by your health care provider. Contact a health care provider if:  You have blood in your stool 2-3 days after the procedure. Get help right away if:  You have more than a small spotting  of blood in your stool.  You pass large blood clots in your stool.  Your abdomen is swollen.  You have nausea or vomiting.  You have a fever.  You have increasing abdominal pain that is not relieved with medicine. Summary  After the procedure, it is common to have mild cramping and bloating in the abdomen.  Do not drive for 24 hours after the procedure.  Try walking around when you have cramps or feel bloated. This information is not intended to replace advice given to you by your health care provider. Make sure you discuss any questions you have with your health care provider. Document Revised: 08/13/2017 Document Reviewed: 02/09/2017 Elsevier Patient Education  [place].
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
## 2020-08-20 NOTE — Transfer of Care (Signed)
Immediate Anesthesia Transfer of Care Note  Patient: [name] [name]  Procedure(s) Performed: COLONOSCOPY WITH PROPOFOL (N/A ) BIOPSY POLYPECTOMY  Patient Location: Endoscopy Unit  Anesthesia Type:MAC  Level of Consciousness: drowsy and responds to stimulation  Airway & Oxygen Therapy: Patient Spontanous Breathing and Patient connected to face mask oxygen  Post-op Assessment: Report given to RN, Post -op Vital signs reviewed and stable and Patient moving all extremities  Post vital signs: Reviewed and stable  Last Vitals:  Vitals Value Taken Time  BP 86/61 08/20/20 1020  Temp    Pulse 61 08/20/20 1021  Resp 20 08/20/20 1021  SpO2 100 % 08/20/20 1021  Vitals shown include unvalidated device data.  Last Pain:  Vitals:   08/20/20 0845  TempSrc: Oral  PainSc: 0-No pain         Complications: No complications documented.

## 2020-08-20 NOTE — Op Note (Signed)
[facility] Patient Name: [name] [name] Procedure Date: 08/20/2020 MRN: [id number] Attending MD: Otis Brace , MD Date of Birth: [birth year] CSN: [id number] Age: 69 Admit Type: Outpatient Procedure:                Colonoscopy Indications:              Surveillance: Personal history of adenomatous                            polyps on last colonoscopy 3 years ago Providers:                Otis Brace, MD, Cleda Daub, RN, Lesia Sago, Technician, Cletis [place], Technician,                            Caryl Pina CRNA Referring MD:              Medicines:                Sedation Administered by an Anesthesia Professional Complications:            No immediate complications. Estimated Blood Loss:     Estimated blood loss was minimal. Procedure:                Pre-Anesthesia Assessment:                           - Prior to the procedure, a History and Physical                            was performed, and patient medications and                            allergies were reviewed. The patient's tolerance of                            previous anesthesia was also reviewed. The risks                            and benefits of the procedure and the sedation                            options and risks were discussed with the patient.                            All questions were answered, and informed consent                            was obtained. Prior Anticoagulants: The patient has                            taken no previous anticoagulant or antiplatelet  agents. ASA Grade Assessment: III - A patient with                            severe systemic disease. After reviewing the risks                            and benefits, the patient was deemed in                            satisfactory condition to undergo the procedure.                           After obtaining informed consent, the colonoscope                             was passed under direct vision. Throughout the                            procedure, the patient's blood pressure, pulse, and                            oxygen saturations were monitored continuously. The                            PCF-H190DL ([id number] Olympus pediatric colonscope                            was introduced through the anus and advanced to the                            the cecum, identified by appendiceal orifice and                            ileocecal valve. The colonoscopy was performed                            without difficulty. The patient tolerated the                            procedure well. The quality of the bowel                            preparation was adequate to identify polyps 6 mm                            and larger in size. Scope In: 9:53:56 AM Scope Out: 10:14:11 AM Scope Withdrawal Time: 0 hours 16 minutes 51 seconds  Total Procedure Duration: 0 hours 20 minutes 15 seconds  Findings:      The perianal and digital rectal examinations were normal.      Three sessile polyps were found in the ascending colon. The polyps were       2 to 3 mm in size. These polyps were removed with a cold biopsy forceps.       Resection and  retrieval were complete.      A 4 mm polyp was found in the ascending colon. The polyp was sessile.       The polyp was removed with a cold snare. Resection and retrieval were       complete.      A few small-mouthed diverticula were found in the sigmoid colon.      Four sessile polyps were found in the recto-sigmoid colon. The polyps       were 5 to 8 mm in size. These polyps were removed with a cold snare.       Resection and retrieval were complete.      Internal hemorrhoids were found during retroflexion. The hemorrhoids       were small. Impression:               - Three 2 to 3 mm polyps in the ascending colon,                            removed with a cold biopsy forceps. Resected and                             retrieved.                           - One 4 mm polyp in the ascending colon, removed                            with a cold snare. Resected and retrieved.                           - Diverticulosis in the sigmoid colon.                           - Four 5 to 8 mm polyps at the recto-sigmoid colon,                            removed with a cold snare. Resected and retrieved.                           - Internal hemorrhoids. Moderate Sedation:      Moderate (conscious) sedation was personally administered by an       anesthesia professional. The following parameters were monitored: oxygen       saturation, heart rate, blood pressure, and response to care. Recommendation:           - Patient has a contact number available for                            emergencies. The signs and symptoms of potential                            delayed complications were discussed with the                            patient. Return to normal activities tomorrow.  Written discharge instructions were provided to the                            patient.                           - Resume previous diet.                           - Continue present medications.                           - Await pathology results.                           - Repeat colonoscopy in 3 - 5 years for                            surveillance based on pathology results.                           - Return to my office PRN. Procedure Code(s):        --- Professional ---                           [phone], Colonoscopy, flexible; with removal of                            tumor(s), polyp(s), or other lesion(s) by snare                            technique                           45380, 61, Colonoscopy, flexible; with biopsy,                            single or multiple Diagnosis Code(s):        --- Professional ---                           K63.5, Polyp of colon                           Z86.010, Personal  history of colonic polyps                           K64.8, Other hemorrhoids                           K57.30, Diverticulosis of large intestine without                            perforation or abscess without bleeding CPT copyright 2019 [employer]. All rights reserved. The codes documented in this report are preliminary and upon coder review may  be revised to meet current compliance requirements. Otis Brace, MD Otis Brace, MD 08/20/2020 10:18:59 AM Number  of Addenda: 0

## 2020-08-21 ENCOUNTER — Encounter: Payer: Self-pay | Admitting: *Deleted

## 2020-08-21 ENCOUNTER — Other Ambulatory Visit: Payer: Self-pay | Admitting: *Deleted

## 2020-08-21 LAB — SURGICAL PATHOLOGY

## 2020-08-21 NOTE — Patient Instructions (Addendum)
Goals Addressed            This Visit's Progress   . Learn More About My Health       Timeframe:  Long-Range Goal Priority:  Medium Start Date:  07/17/20                          Expected End Date: 07/17/21                        - tell my story and reason for my visit - make a list of questions - ask questions - repeat what I heard to make sure I understand - bring a list of my medicines to the visit - speak up when I don't understand    Why is this important?   The best way to learn about your health and care is by talking to the doctor and nurse.  They will answer your questions and give you information in the way that you like best.    Notes: Patient states that he does not read at a high level. Nurse encourages patient to ask questions frequently, to bring a list of questions and his medications to his provider appointments, and/or contact this nurse for assistance as needed.  Updated: 08/21/20    . Patient will not have any ED or hospital admission within the next 90 days       [place] (see longtitudinal plan of care for additional care plan information)  Current Barriers:  Marland Kitchen Knowledge deficits related to basic COPD self care/management   Case Manager Clinical Goal(s):  Over the next 90 days patient will report using inhalers as prescribed including rinsing mouth after use  Over the next 90 days patient will report utilizing pursed lip breathing for shortness of breath  Over the next 90 days, patient will be able to verbalize understanding of COPD action plan and when to seek appropriate levels of medical care  Over the next 90 days, patient will engage in lite exercise as tolerated to build/regain stamina and strength and reduce shortness of breath through activity tolerance  Over the next 90 days, patient will verbalize basic understanding of COPD disease process and self care activities  Over the next 90 days, patient will not be hospitalized for COPD  exacerbation  Patient reports that he is following COPD zones and action plans .   Interventions:   Provided patient with basic written and verbal COPD education on self care/management/and exacerbation prevention   Provided patient with COPD action plan and reinforced importance of daily self assessment  Provided written and verbal instructions on pursed lip breathing and utilized returned demonstration as teach back  Advised patient to self assesses COPD action plan zone and make appointment with provider if in the yellow zone for 48 hours without improvement.  Provided patient with education about the role of exercise in the management of COPD  Advised patient to engage in light exercise as tolerated 3-5 days a week  Provided education about and advised patient to utilize infection prevention strategies to reduce risk of respiratory infection   Patient Self Care Activities:  . Takes medications as prescribed including inhalers . Self assesses COPD action plan zone and makes appointment with provider if in the yellow zone for 48 hours without improvement. . Engages in light exercise 3-5 days a week . Utilizes infection prevention strategies to reduce risk of respiratory infection  .  Patient manages his trach without difficulty.    . Patient has received this years annual flu shot and plans to get the covid booster during his upcoming [place] appointment.   Please see past updates related to this goal by clicking on the "Past Updates" button in the selected goal    Updated: 08/21/20     . Track and Manage My Symptoms       Timeframe:  Long-Range Goal Priority:  High Start Date: 07/17/20                            Expected End Date: 07/17/21                        - develop a rescue plan - eliminate symptom triggers at home - follow rescue plan if symptoms flare-up - keep follow-up appointments    Why is this important?   Tracking your symptoms and other information about your  health helps your doctor plan your care.  Write down the symptoms, the time of day, what you were doing and what medicine you are taking.  You will soon learn how to manage your symptoms.     Notes: Patient monitors his symptoms closely and follows his rescue plan. He manages his trach without difficulty. Updated: 08/21/20    . Track and Manage My Triggers       Timeframe:  Long-Range Goal Priority:  High Start Date: 07/17/20                           Expected End Date: 07/17/21                        - avoid second hand smoke - identify and remove indoor air pollutants - limit outdoor activity during cold weather - listen for public air quality announcements every day    Why is this important?   Triggers are activities or things, like tobacco smoke or cold weather, that make your COPD (chronic obstructive pulmonary disease) flare-up.  Knowing these triggers helps you plan how to stay away from them.  When you cannot remove them, you can learn how to manage them.     Notes: Patient states his main trigger currently is cold weather. Patient states he does well if he limits his time outside.  Updated: 08/21/20

## 2020-08-21 NOTE — Patient Outreach (Signed)
[name]) Care Management  [place]  08/21/2020   [name] [name] [birth year] [id number]  Subjective: Successful telephone outreach call to patient. HIPAA identifiers obtained. Patient reports that he is doing very well and that he tolerated his colonoscopy procedure without any difficulty yesterday. Patient reports that he did get his driver's license and bought a vehicle. He states that he went to the [place] and had an eye exam last month, now has new glasses, and he plans to receive the covid booster during his upcoming [place] appointment which per patient is in about another month. Patient requested assistance in scheduling an appointment through [facility] to get the Shingrix  Vaccine; nurse was able to schedule an appointment for today during this phone visit.   Patient states that his COPD is currently under control. He states the [place] doctor changed his controlled inhaler to [facility] and that it seems to be working well thus far. He does have difficulty fitting the inhaler into his trach. Nurse discussed with the patient to get a spacer to assist with the medication delivery. Patient stated he would call his home delivery pharmacy and order one. Patient has all of the trach supplies that he needs and continues to use saline bullets 2-3 times per day to help loosen his phlegm which makes suctioning easier. Patient did not have any further questions or concerns today and did confirm that he has this nurse's contact number to call her if needed.   Encounter Medications:  Outpatient Encounter Medications as of 08/21/2020  Medication Sig Note  . acetaminophen 325 MG tablet Take 2 tablets (650 mg total) by mouth every 6 (six) hours as needed. (Patient taking differently: Take 650 mg by mouth every 6 (six) hours as needed for pain. )   . albuterol (PROVENTIL) (2.5 MG/3ML) 0.083% nebulizer solution Take 3 mLs (2.5 mg total) by nebulization every 4 (four) hours as needed for wheezing or  shortness of breath.   Marland Kitchen albuterol (VENTOLIN HFA) 108 (90 Base) MCG/ACT inhaler INHALE 2 PUFFS INTO THE LUNGS EVERY 6 HOURS AS NEEDED (Patient taking differently: Inhale 2 puffs into the lungs every 6 (six) hours as needed for wheezing or shortness of breath. )   . Carboxymethylcellulose Sod PF (REFRESH PLUS) 0.5 % SOLN Place 1 drop into both eyes 2 (two) times daily as needed (dry eyes).   . cetirizine (ZYRTEC) 10 MG tablet Take 10 mg by mouth daily as needed for allergies.    . finasteride (PROSCAR) 5 MG tablet Take 5 mg by mouth daily.   . fluticasone (FLONASE) 50 MCG/ACT nasal spray Place 2 sprays into both nostrils daily as needed for allergies.    Marland Kitchen guaiFENesin (MUCINEX) 600 MG 12 hr tablet Take 1 tablet (600 mg total) by mouth 2 (two) times daily as needed for to loosen phlegm. (Patient taking differently: Take 600 mg by mouth daily. )   . hydrochlorothiazide (HYDRODIURIL) 25 MG tablet Take 1 tablet (25 mg total) by mouth daily.   Marland Kitchen losartan (COZAAR) 50 MG tablet Take 50 mg by mouth daily.    . mometasone-formoterol (DULERA) 100-5 MCG/ACT AERO Inhale 2 puffs into the lungs 2 (two) times daily.   . Multiple Vitamin (MULTIVITAMIN) tablet Take 1 tablet by mouth daily.   . sodium chloride 0.9 % nebulizer solution Take 3-4 mLs by nebulization daily as needed. Use 3-4 drops into Trach to loosen phlegm suction secretions immediatly after.    . terazosin (HYTRIN) 5 MG capsule Take 5 mg  by mouth at bedtime.    Marland Kitchen amoxicillin-clavulanate (AUGMENTIN) 875-125 MG tablet Take 1 tablet by mouth 2 (two) times daily. (Patient not taking: Reported on 08/21/2020) 08/21/2020: completed  . azithromycin (ZITHROMAX) 250 MG tablet 2 tablet's 1st day and 1 tablet daily until done. (Patient not taking: Reported on 07/17/2020) 08/21/2020: completed  . budesonide-formoterol (SYMBICORT) 160-4.5 MCG/ACT inhaler INHALE TWO PUFFS BY MOUTH FIRST THING IN THE MORNING AND THEN ANOTHER TWO PUFFS 12 HOURS LATER (Patient not taking:  Reported on 08/13/2020)   . docusate sodium (COLACE) 100 MG capsule Take 1 capsule (100 mg total) by mouth 2 (two) times daily as needed for mild constipation. (Patient not taking: Reported on 05/10/2020) 08/21/2020: Not needed  . ibuprofen (ADVIL) 800 MG tablet Take 1 tablet (800 mg total) by mouth every 8 (eight) hours as needed. (Patient not taking: Reported on 08/13/2020) 08/21/2020: completed  . oxyCODONE (OXY IR/ROXICODONE) 5 MG immediate release tablet Take 1 tablet (5 mg total) by mouth every 6 (six) hours as needed for severe pain. (Patient not taking: Reported on 07/17/2020) 08/21/2020: completed  . predniSONE (DELTASONE) 10 MG tablet 4 each am x2 days,2 each am  x2 days,1 each am x2 days and stop. (Patient not taking: Reported on 07/17/2020) 08/21/2020: completed   No facility-administered encounter medications on file as of 08/21/2020.    Functional Status:  In your present state of health, do you have any difficulty performing the following activities: 05/15/2020 04/02/2020  Hearing? N N  Vision? N N  Difficulty concentrating or making decisions? N N  Walking or climbing stairs? N N  Dressing or bathing? N N  Comment - -  Doing errands, shopping? N [place] and eating ? - N  Comment - -  Using the Toilet? - N  In the past six months, have you accidently leaked urine? - N  Do you have problems with loss of bowel control? - N  Managing your Medications? - N  Managing your Finances? - N  Housekeeping or managing your Housekeeping? - N  Some recent data might be hidden    Fall/Depression Screening: Fall Risk  07/17/2020 07/17/2020 06/03/2020  Falls in the past year? 0 0 0  Number falls in past yr: 0 0 0  Injury with Fall? 0 0 0  Follow up Falls prevention discussed;Education provided;Falls evaluation completed Falls prevention discussed;Education provided;Falls evaluation completed Falls prevention discussed;Education provided;Falls evaluation completed   PHQ 2/9  Scores 07/17/2020 04/02/2020 12/29/2019 10/26/2019 07/26/2019 06/01/2019 03/02/2019  PHQ - 2 Score 0 0 0 0 0 0 0    Assessment:  Goals Addressed            This Visit's Progress   . Learn More About My Health       Timeframe:  Long-Range Goal Priority:  Medium Start Date:  07/17/20                          Expected End Date: 07/17/21                        - tell my story and reason for my visit - make a list of questions - ask questions - repeat what I heard to make sure I understand - bring a list of my medicines to the visit - speak up when I don't understand    Why is this important?   The best  way to learn about your health and care is by talking to the doctor and nurse.  They will answer your questions and give you information in the way that you like best.    Notes: Patient states that he does not read at a high level. Nurse encourages patient to ask questions frequently, to bring a list of questions and his medications to his provider appointments, and/or contact this nurse for assistance as needed.  Updated: 08/21/20    . Patient will not have any ED or hospital admission within the next 90 days       [place] (see longtitudinal plan of care for additional care plan information)  Current Barriers:  Marland Kitchen Knowledge deficits related to basic COPD self care/management   Case Manager Clinical Goal(s):  Over the next 90 days patient will report using inhalers as prescribed including rinsing mouth after use  Over the next 90 days patient will report utilizing pursed lip breathing for shortness of breath  Over the next 90 days, patient will be able to verbalize understanding of COPD action plan and when to seek appropriate levels of medical care  Over the next 90 days, patient will engage in lite exercise as tolerated to build/regain stamina and strength and reduce shortness of breath through activity tolerance  Over the next 90 days, patient will verbalize basic  understanding of COPD disease process and self care activities  Over the next 90 days, patient will not be hospitalized for COPD exacerbation  Patient reports that he is following COPD zones and action plans .   Interventions:   Provided patient with basic written and verbal COPD education on self care/management/and exacerbation prevention   Provided patient with COPD action plan and reinforced importance of daily self assessment  Provided written and verbal instructions on pursed lip breathing and utilized returned demonstration as teach back  Advised patient to self assesses COPD action plan zone and make appointment with provider if in the yellow zone for 48 hours without improvement.  Provided patient with education about the role of exercise in the management of COPD  Advised patient to engage in light exercise as tolerated 3-5 days a week  Provided education about and advised patient to utilize infection prevention strategies to reduce risk of respiratory infection   Patient Self Care Activities:  . Takes medications as prescribed including inhalers . Self assesses COPD action plan zone and makes appointment with provider if in the yellow zone for 48 hours without improvement. . Engages in light exercise 3-5 days a week . Utilizes infection prevention strategies to reduce risk of respiratory infection  . Patient manages his trach without difficulty.    . Patient has received this years annual flu shot and plans to get the covid booster during his upcoming [place] appointment.   Please see past updates related to this goal by clicking on the "Past Updates" button in the selected goal    Updated: 08/21/20     . Track and Manage My Symptoms       Timeframe:  Long-Range Goal Priority:  High Start Date: 07/17/20                            Expected End Date: 07/17/21                        - develop a rescue plan - eliminate symptom triggers at home - follow  rescue plan if  symptoms flare-up - keep follow-up appointments    Why is this important?   Tracking your symptoms and other information about your health helps your doctor plan your care.  Write down the symptoms, the time of day, what you were doing and what medicine you are taking.  You will soon learn how to manage your symptoms.     Notes: Patient monitors his symptoms closely and follows his rescue plan. He manages his trach without difficulty. Updated: 08/21/20    . Track and Manage My Triggers       Timeframe:  Long-Range Goal Priority:  High Start Date: 07/17/20                           Expected End Date: 07/17/21                        - avoid second hand smoke - identify and remove indoor air pollutants - limit outdoor activity during cold weather - listen for public air quality announcements every day    Why is this important?   Triggers are activities or things, like tobacco smoke or cold weather, that make your COPD (chronic obstructive pulmonary disease) flare-up.  Knowing these triggers helps you plan how to stay away from them.  When you cannot remove them, you can learn how to manage them.     Notes: Patient states his main trigger currently is cold weather. Patient states he does well if he limits his time outside.  Updated: 08/21/20       Plan: RN Health Coach will call the patient within the month of February. Follow-up:  Patient agrees to Care Plan and Follow-up.  Emelia Loron RN, BSN [place] [phone] [email or web address]

## 2020-08-22 ENCOUNTER — Encounter: Payer: Self-pay | Admitting: *Deleted

## 2020-08-23 ENCOUNTER — Encounter (HOSPITAL_COMMUNITY): Payer: Self-pay | Admitting: Gastroenterology

## 2020-08-23 NOTE — Anesthesia Postprocedure Evaluation (Signed)
Anesthesia Post Note  Patient: [name] [name]  Procedure(s) Performed: COLONOSCOPY WITH PROPOFOL (N/A ) BIOPSY POLYPECTOMY     Patient location during evaluation: Endoscopy Anesthesia Type: MAC Level of consciousness: awake and alert Pain management: pain level controlled Vital Signs Assessment: post-procedure vital signs reviewed and stable Respiratory status: spontaneous breathing, nonlabored ventilation, respiratory function stable and patient connected to nasal cannula oxygen Cardiovascular status: stable and blood pressure returned to baseline Postop Assessment: no apparent nausea or vomiting Anesthetic complications: no   No complications documented.  Last Vitals:  Vitals:   08/20/20 1030 08/20/20 1040  BP: (!) 108/57 121/73  Pulse: (!) 58 (!) 57  Resp: 16 17  Temp:    SpO2: 100% 99%    Last Pain:  Vitals:   08/20/20 1040  TempSrc:   PainSc: 0-No pain                 [place]

## 2020-09-02 DIAGNOSIS — Z430 Encounter for attention to tracheostomy: Secondary | ICD-10-CM | POA: Diagnosis not present

## 2020-09-02 DIAGNOSIS — Z930 Tracheostomy status: Secondary | ICD-10-CM | POA: Diagnosis not present

## 2020-09-10 ENCOUNTER — Telehealth: Payer: Self-pay | Admitting: Internal Medicine

## 2020-09-25 NOTE — Telephone Encounter (Signed)
To date I have not received any authorization from the [place] for this patient - faxed cover sheet to Katina's attention advising her of this and have her fax he auth to my attention at [phone] -pr

## 2020-09-26 ENCOUNTER — Telehealth: Payer: Self-pay | Admitting: Internal Medicine

## 2020-09-26 DIAGNOSIS — J449 Chronic obstructive pulmonary disease, unspecified: Secondary | ICD-10-CM

## 2020-09-26 IMAGING — CT CT CHEST WITH CONTRAST
2 of 3 series · 15 of 36 positions shown, 18 images · IV contrast (Omni 300)
Comparison: 05/09/2016

CLINICAL DATA: Chest pain radiating to the back with cough for 1
week

EXAM:
CT CHEST WITH CONTRAST
TECHNIQUE: Multidetector CT imaging of the chest was performed during
intravenous contrast administration.
CONTRAST:  75mL OMNIPAQUE IOHEXOL 300 MG/ML  SOLN

[Series 3: chest with 2mm st · axial · 0.72mm/px · z∈[-284,-44]mm · 12 of 142 slices shown, 15 images]
[im 11/142  mediastinal]
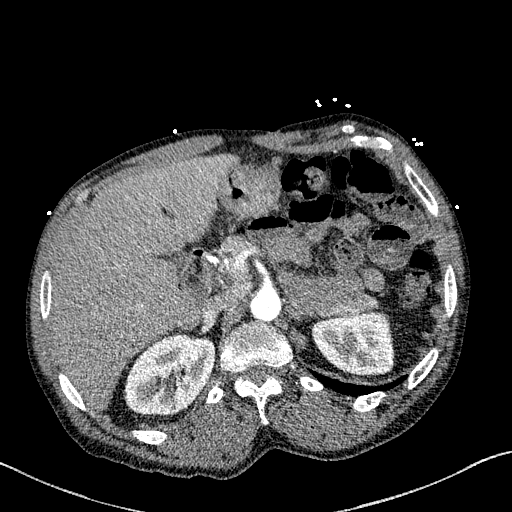
[im 11/142  lung]
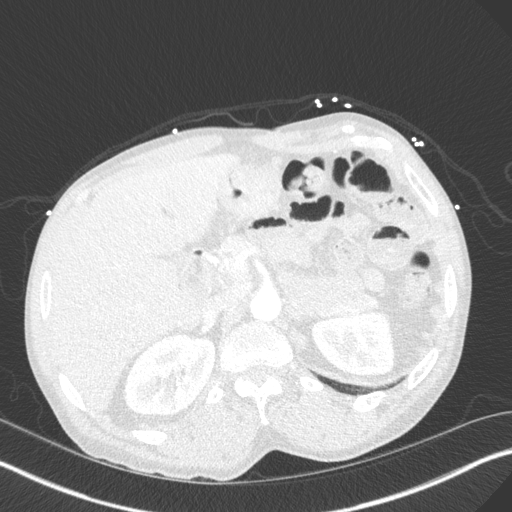
[im 21/142  lung]
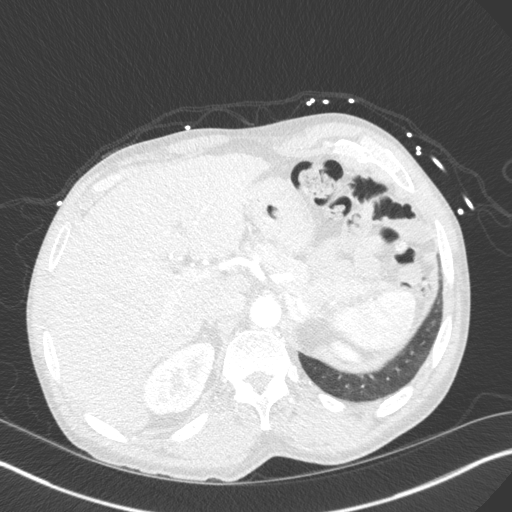
[im 32/142  lung]
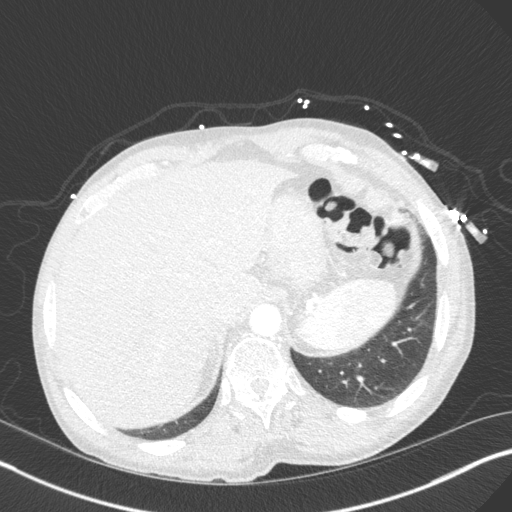
[im 42/142  lung]
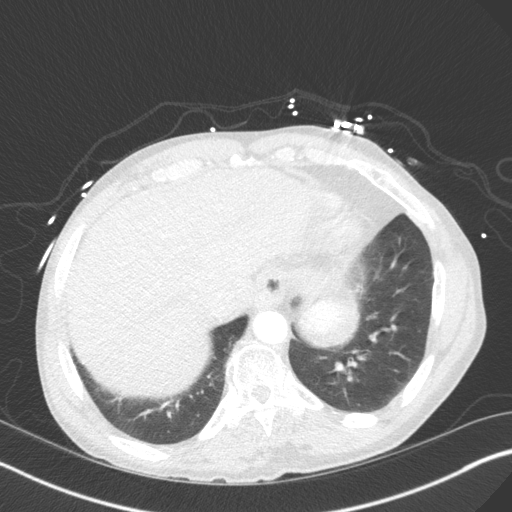
[im 53/142  mediastinal]
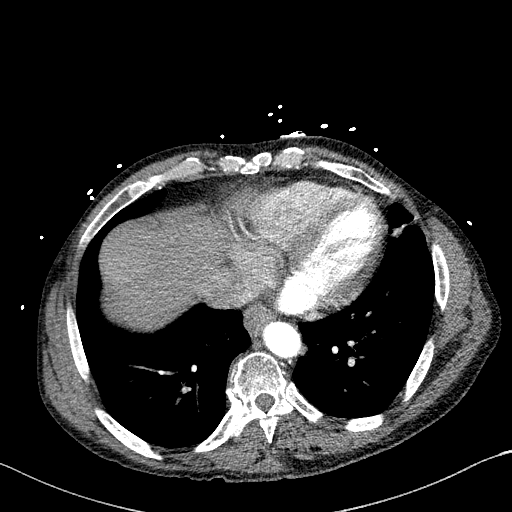
[im 53/142  lung]
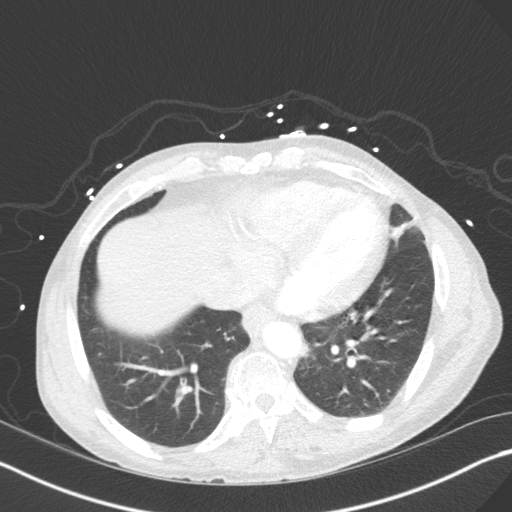
[im 63/142  lung]
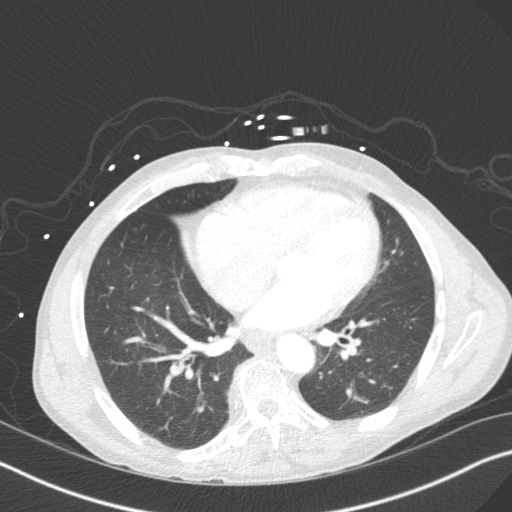
[im 79/142  lung]
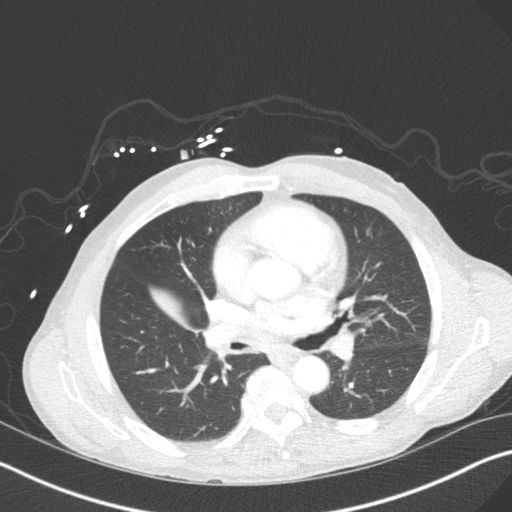
[im 89/142  lung]
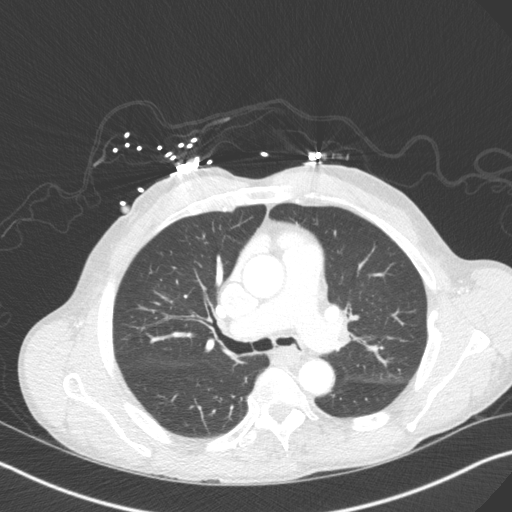
[im 100/142  mediastinal]
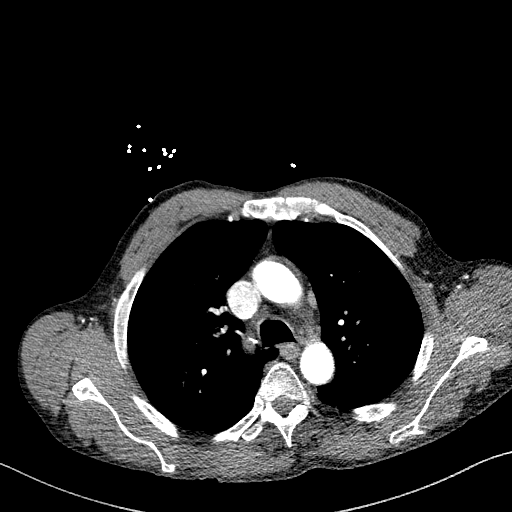
[im 100/142  lung]
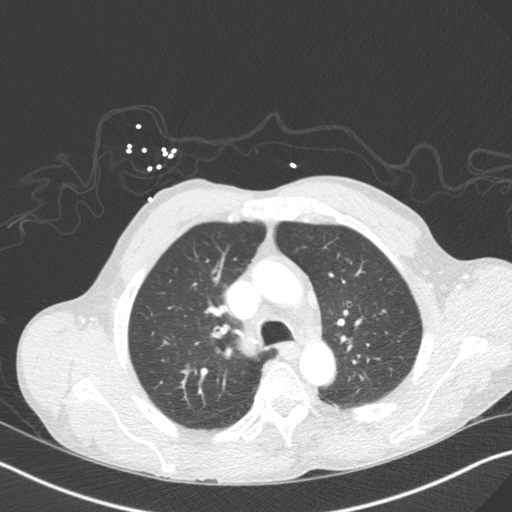
[im 110/142  lung]
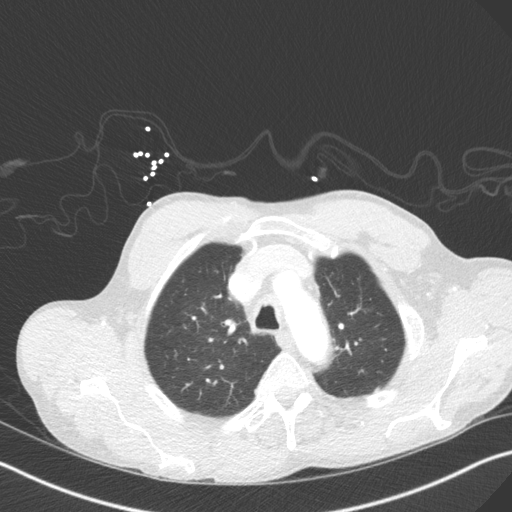
[im 121/142  lung]
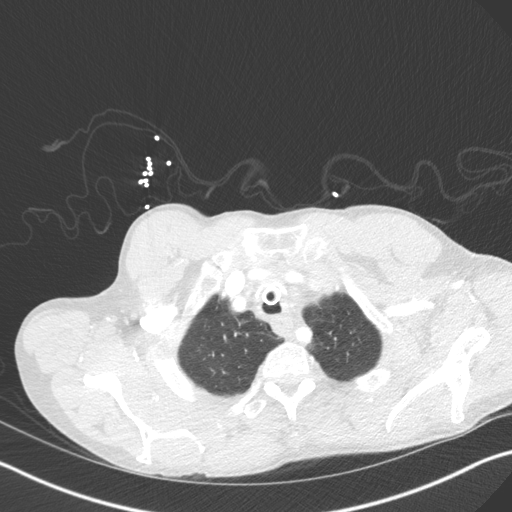
[im 131/142  lung]
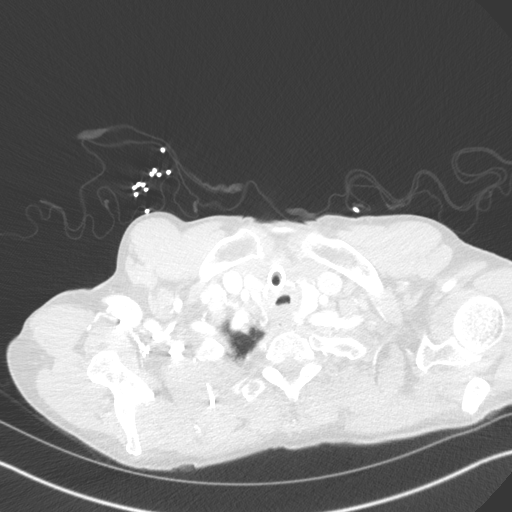

[Series 6: chest with 3mm st cor · coronal · 0.55mm/px · 3 of 83 slices shown]
[im 17/83  lung]
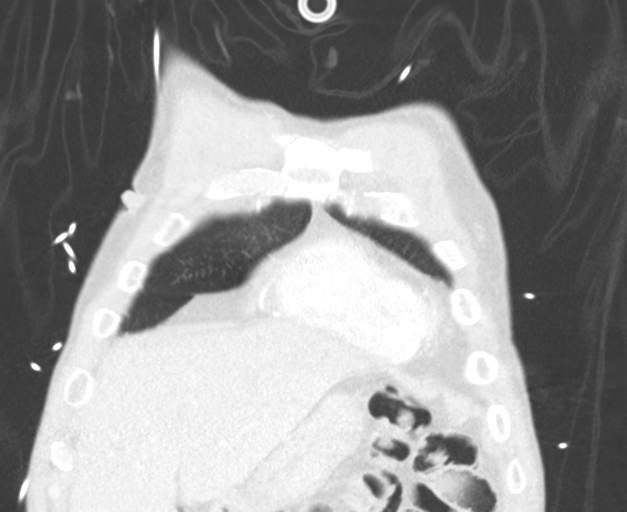
[im 33/83  lung]
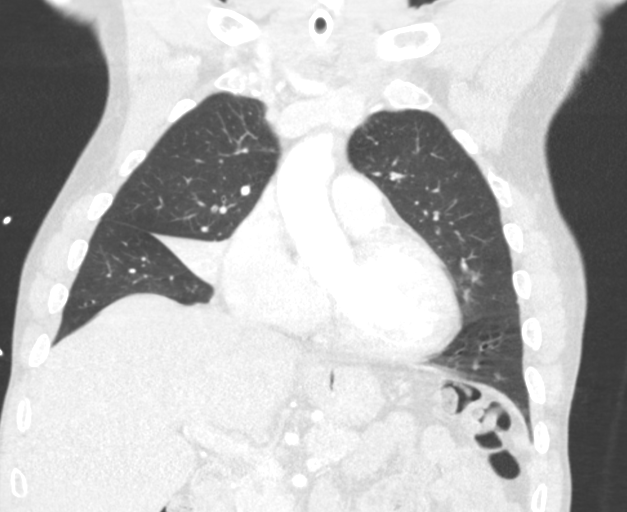
[im 50/83  lung]
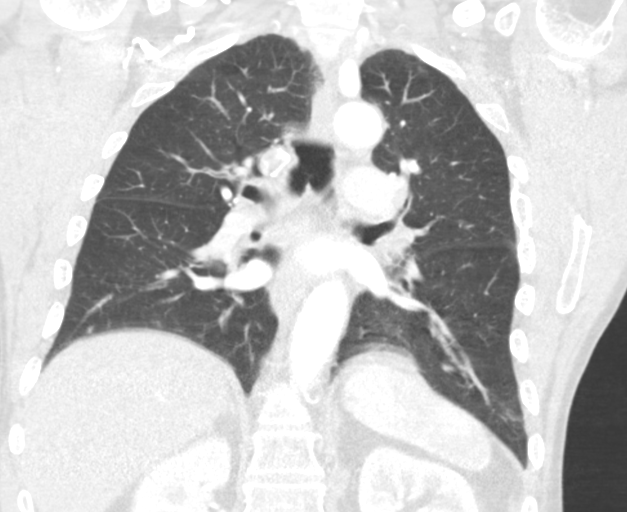

[15 of 36 positions shown; findings below may reference images not displayed]

FINDINGS: Cardiovascular: Normal heart size. No pericardial effusion.
Atherosclerosis. No acute vascular finding

Mediastinum/Nodes: Negative for adenopathy or mass.

Lungs/Pleura: Tracheostomy tube in place. There is diffuse airway
thickening with bronchial narrowing compatible bronchomalacia.
Complete right middle lobe collapse. The right middle lobe bronchi
are occluded without discrete masslike finding. The right middle
lobe is also collapsed on a 4300 CT, although the right middle lobe
bronchi were better aerated at that time and showed a degree of mild
bronchiectasis. Elsewhere, there is no edema, consolidation,
effusion, or pneumothorax.

Upper Abdomen: Negative

Musculoskeletal: Remote left rib fractures with bridging callus.
IMPRESSION: 1. Right middle lobe bronchial opacification with lobar collapse. No
discrete lesion is seen and there was an episode of right middle
lobe collapse also noted in 4300 by CT, favor mucous plugging or
airway stenosis. Suggest chest CT follow-up in 3-6 months if
bronchoscopy is deferred.
2. Chronic bronchitis and bronchomalacia.

## 2020-09-26 IMAGING — CR THORACIC SPINE 2 VIEWS
3 series · 3 of 3 positions shown · non-contrast
Comparison: Chest radiograph dated 11/29/2018

CLINICAL DATA: 67-year-old male with chest pain and cough.

EXAM:
CHEST - 2 VIEW; THORACIC SPINE 2 VIEWS

[t-spine ap]
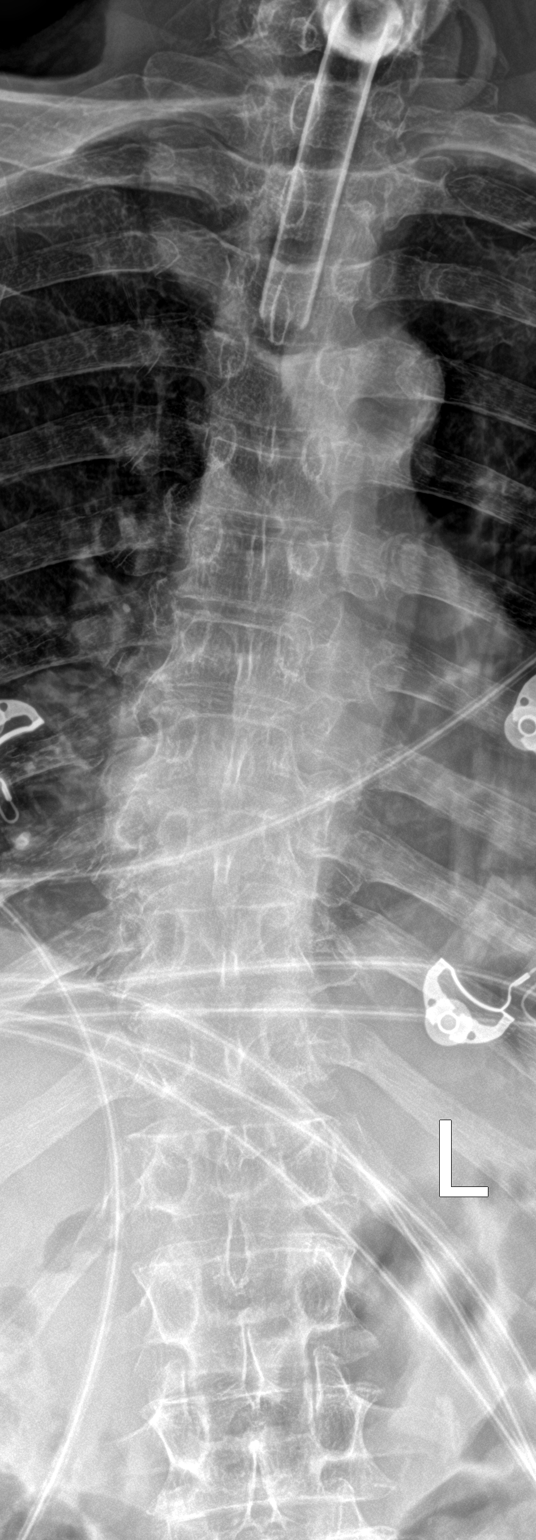

[t-spine lat]
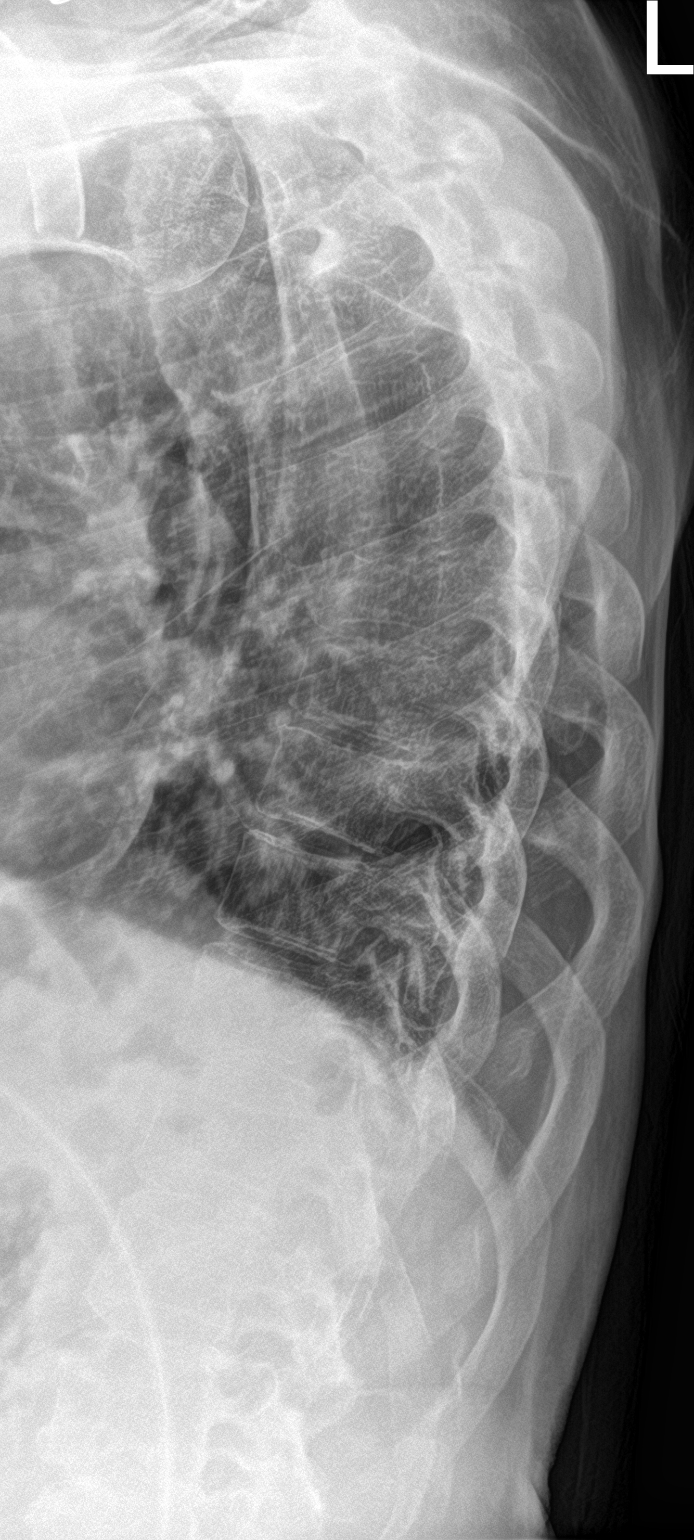

[t-spine swimmers]
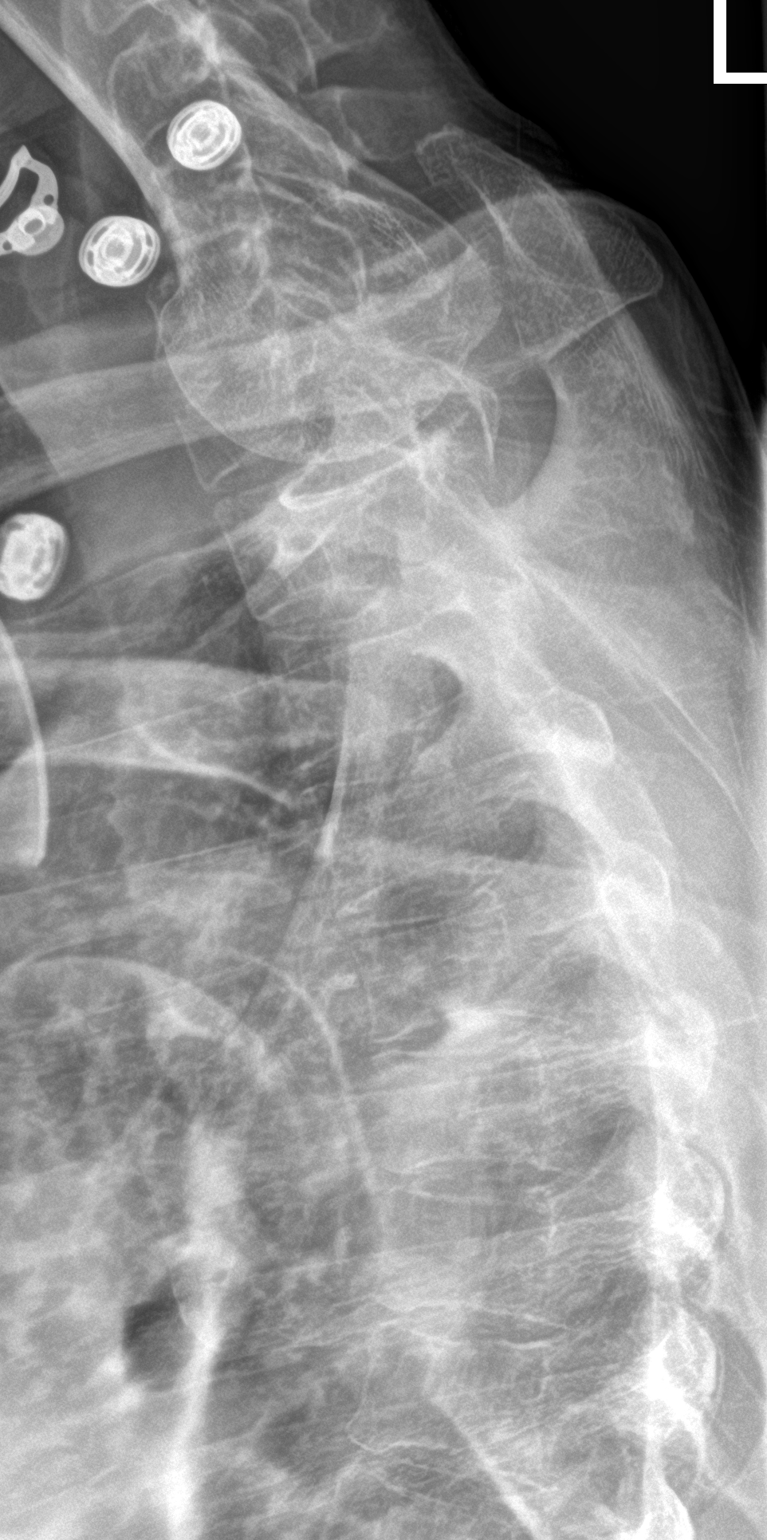

[3 of 3 positions shown; findings below may reference images not displayed]

FINDINGS: Tracheostomy in stable position. Right middle lobe
atelectasis/infiltrate similar to prior radiograph. CT may provide
better evaluation. Left lung base hazy density, likely atelectatic
changes. Overall no significant interval change in the appearance of
the lungs since the most recent prior radiograph. No large pleural
effusion. There is no pneumothorax. Stable cardiac silhouette. No
acute osseous pathology.

No acute fracture or subluxation of the thoracic spine. The bones
are osteopenic.
IMPRESSION: 1. Right middle lobe collapse/atelectasis or infiltrate similar to
most recent prior radiograph. CT may provide better evaluation.
2. No acute/traumatic thoracic spine pathology.

## 2020-09-26 NOTE — Telephone Encounter (Signed)
Spoke with the pt  He is needing order send to [facility] for neb machine supplies  Order sent  Nothing further needed

## 2020-10-01 DIAGNOSIS — J45909 Unspecified asthma, uncomplicated: Secondary | ICD-10-CM | POA: Diagnosis not present

## 2020-10-01 DIAGNOSIS — K219 Gastro-esophageal reflux disease without esophagitis: Secondary | ICD-10-CM | POA: Diagnosis not present

## 2020-10-01 DIAGNOSIS — I10 Essential (primary) hypertension: Secondary | ICD-10-CM | POA: Diagnosis not present

## 2020-10-01 DIAGNOSIS — E785 Hyperlipidemia, unspecified: Secondary | ICD-10-CM | POA: Diagnosis not present

## 2020-10-01 DIAGNOSIS — J4530 Mild persistent asthma, uncomplicated: Secondary | ICD-10-CM | POA: Diagnosis not present

## 2020-10-01 DIAGNOSIS — J449 Chronic obstructive pulmonary disease, unspecified: Secondary | ICD-10-CM | POA: Diagnosis not present

## 2020-10-03 DIAGNOSIS — Z430 Encounter for attention to tracheostomy: Secondary | ICD-10-CM | POA: Diagnosis not present

## 2020-10-08 ENCOUNTER — Telehealth: Payer: Self-pay | Admitting: Internal Medicine

## 2020-10-09 NOTE — Telephone Encounter (Signed)
Faxed last ov to the [place] -pr

## 2020-10-23 DIAGNOSIS — J3802 Paralysis of vocal cords and larynx, bilateral: Secondary | ICD-10-CM | POA: Diagnosis not present

## 2020-10-23 DIAGNOSIS — D141 Benign neoplasm of larynx: Secondary | ICD-10-CM | POA: Diagnosis not present

## 2020-10-23 DIAGNOSIS — B3781 Candidal esophagitis: Secondary | ICD-10-CM | POA: Diagnosis not present

## 2020-10-23 DIAGNOSIS — Z430 Encounter for attention to tracheostomy: Secondary | ICD-10-CM | POA: Diagnosis not present

## 2020-10-23 DIAGNOSIS — Z9889 Other specified postprocedural states: Secondary | ICD-10-CM | POA: Diagnosis not present

## 2020-10-23 DIAGNOSIS — R490 Dysphonia: Secondary | ICD-10-CM | POA: Diagnosis not present

## 2020-10-23 DIAGNOSIS — J386 Stenosis of larynx: Secondary | ICD-10-CM | POA: Diagnosis not present

## 2020-10-30 ENCOUNTER — Other Ambulatory Visit: Payer: Self-pay | Admitting: *Deleted

## 2020-10-30 NOTE — Patient Outreach (Addendum)
[facility]) Care Management  10/30/2020  Adriel Kessen 02/03/1951 [id number]  Unsuccessful outreach attempt made to patient. RN Health Coach left HIPAA compliant voicemail message along with her contact information.  Plan: RN Health Coach will call patient within the month of March.  Emelia Loron RN, BSN [place] [phone] [email or web address]

## 2020-10-31 ENCOUNTER — Ambulatory Visit: Payer: No Typology Code available for payment source | Admitting: Internal Medicine

## 2020-11-01 DIAGNOSIS — Z430 Encounter for attention to tracheostomy: Secondary | ICD-10-CM | POA: Diagnosis not present

## 2020-11-01 DIAGNOSIS — Z930 Tracheostomy status: Secondary | ICD-10-CM | POA: Diagnosis not present

## 2020-11-14 ENCOUNTER — Encounter: Payer: Self-pay | Admitting: Internal Medicine

## 2020-11-14 ENCOUNTER — Other Ambulatory Visit: Payer: Self-pay

## 2020-11-14 ENCOUNTER — Ambulatory Visit (INDEPENDENT_AMBULATORY_CARE_PROVIDER_SITE_OTHER): Payer: Medicare Other | Admitting: Internal Medicine

## 2020-11-14 DIAGNOSIS — R0609 Other forms of dyspnea: Secondary | ICD-10-CM

## 2020-11-14 DIAGNOSIS — J4530 Mild persistent asthma, uncomplicated: Secondary | ICD-10-CM | POA: Diagnosis not present

## 2020-11-14 DIAGNOSIS — R058 Other specified cough: Secondary | ICD-10-CM

## 2020-11-14 DIAGNOSIS — R0600 Dyspnea, unspecified: Secondary | ICD-10-CM

## 2020-11-14 NOTE — Assessment & Plan Note (Signed)
07/14/2018  Walked RA x 3 laps @ 185 ft each stopped due to  End of study, nl pace, no sob or desats once the pmv was removed p the first lap with loud breathing heard "around the corner" with pmv in place and pt clearly working hard to breath    Nl ex tol at present, no change rx needed.

## 2020-11-14 NOTE — Assessment & Plan Note (Signed)
Onset p trach placed in 2017 10/30/2016    try reduce symb to 80 2bid  - 01/21/2017  After extensive coaching HFA effectiveness =    75% via adaptor to trach  - Spirometry 12/14/2017  FEV1 1.59 (66%)  Ratio 65 after symb with poor hfa  - 12/14/2017  After extensive coaching inhaler device  effectiveness =    75% from a baseline of 25 % > continue symb 160 2bid  - PFT's  01/14/2018  FEV1 1.32 (51 % ) ratio 63  p 3 % improvement from saba p ? prior to study with DLCO  58/61 % corrects to 118  % for alv volume - 01/14/2018  After extensive coaching inhaler device  effectiveness =    75% try change to bevespi and floor dose of pred @ 10 mg daily > did not do - on singulair as of ov 07/14/2018  - Spirometry 07/14/2018  FEV1 1.2  (48%)  Ratio 61  - 07/14/2018   change from bevespi to symbicort 160 2bid   - 02/28/2019  After extensive coaching inhaler device,  effectiveness =    0% (no adequate insp flow) so rec all inhalers, nebs via trach    All goals of chronic asthma control met including optimal function and elimination of symptoms with minimal need for rescue therapy.  Contingencies discussed in full including contacting this office immediately if not controlling the symptoms using the rule of two's.     rec f/u in 6 m with all meds in hand using a trust but verify approach to confirm accurate Medication  Reconciliation The principal here is that until we are certain that the  patients are doing what we've asked, it makes no sense to ask them to do more.          Each maintenance medication was reviewed in detail including emphasizing most importantly the difference between maintenance and prns and under what circumstances the prns are to be triggered using an action plan format where appropriate.  Total time for H and P, chart review, counseling, reviewing hfa device(s) and generating customized AVS unique to this office visit / same day charting = 25 min

## 2020-11-14 NOTE — Patient Instructions (Signed)
No change in medications   Please schedule a follow up visit in 6 months but call sooner if needed  with all medications /inhalers/ solutions in hand so we can verify exactly what you are taking. This includes all medications from all doctors and over the counters
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
## 2020-11-14 NOTE — Assessment & Plan Note (Signed)
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

## 2020-11-15 ENCOUNTER — Other Ambulatory Visit: Payer: Self-pay | Admitting: *Deleted

## 2020-11-15 NOTE — Patient Instructions (Signed)
Goals Addressed            This Visit's Progress   . [facility]) Learn More About My Health   On track    Timeframe:  Long-Range Goal Priority:  Medium Start Date:  07/17/20                          Expected End Date: 07/17/21                     Follow Up Date: 03/13/21   - tell my story and reason for my visit - make a list of questions - ask questions - repeat what I heard to make sure I understand - bring a list of my medicines to the visit - speak up when I don't understand  -Encouraged patient to continue to have his daughter assist and discussed for the patient to contact nurse for questions regarding his health a wellness.    Why is this important?   The best way to learn about your health and care is by talking to the doctor and nurse.  They will answer your questions and give you information in the way that you like best.    Notes: Patient states that he does not read at a high level. Nurse encourages patient to ask questions frequently, to bring a list of questions and his medications to his provider appointments, and/or contact this nurse for assistance as needed.  Updated: 08/21/20  Updated 11/15/20: Encouraged patient to continue to have his daughter assist and discussed for the patient to contact nurse for questions regarding his health a wellness.         Marland Kitchen [facility]) Track and Manage My Triggers   On track    Timeframe:  Long-Range Goal Priority:  High Start Date: 07/17/20                           Expected End Date: 07/17/21                     Follow-up Date: 03/13/21   - avoid second hand smoke - identify and remove indoor air pollutants - limit outdoor activity during cold weather - listen for public air quality announcements every day    Why is this important?   Triggers are activities or things, like tobacco smoke or cold weather, that make your COPD (chronic obstructive pulmonary disease) flare-up.  Knowing these triggers helps you plan how to stay away from them.   When you cannot remove them, you can learn how to manage them.     Notes: Patient states his main trigger currently is cold weather. Patient states he does well if he limits his time outside.  Updated: 08/21/20  Updated 11/15/20: Patient states he is currently doing well and the recent weather changes have not effected his breathing.     . Patient will not have any ED or hospital admission within the next 90 days   On track    [place] (see longtitudinal plan of care for additional care plan information)  Current Barriers:  Marland Kitchen Knowledge deficits related to basic COPD self care/management   Case Manager Clinical Goal(s):  Over the next 90 days patient will report using inhalers as prescribed including rinsing mouth after use  Over the next 90 days patient will report utilizing pursed lip breathing for shortness of breath  Over the  next 90 days, patient will be able to verbalize understanding of COPD action plan and when to seek appropriate levels of medical care  Over the next 90 days, patient will engage in lite exercise as tolerated to build/regain stamina and strength and reduce shortness of breath through activity tolerance  Over the next 90 days, patient will verbalize basic understanding of COPD disease process and self care activities  Over the next 90 days, patient will not be hospitalized for COPD exacerbation  Patient reports that he is following COPD zones and action plans .   Interventions:   Provided patient with basic written and verbal COPD education on self care/management/and exacerbation prevention   Provided patient with COPD action plan and reinforced importance of daily self assessment  Provided written and verbal instructions on pursed lip breathing and utilized returned demonstration as teach back  Advised patient to self assesses COPD action plan zone and make appointment with provider if in the yellow zone for 48 hours without improvement.  Provided  patient with education about the role of exercise in the management of COPD  Advised patient to engage in light exercise as tolerated 3-5 days a week  Provided education about and advised patient to utilize infection prevention strategies to reduce risk of respiratory infection   Patient Self Care Activities:  . Takes medications as prescribed including inhalers . Self assesses COPD action plan zone and makes appointment with provider if in the yellow zone for 48 hours without improvement. . Engages in light exercise 3-5 days a week . Utilizes infection prevention strategies to reduce risk of respiratory infection  . Patient manages his trach without difficulty.    . Patient has received this years annual flu shot and plans to get the covid booster during his upcoming [place] appointment.   Timeframe:  Long-Range Goal Priority:  High Start Date:  04/02/20                           Expected End Date: 04/02/21 Follow Up Date: 03/13/21                      Please see past updates related to this goal by clicking on the "Past Updates" button in the selected goal    Updated: 08/21/20  Updated 11/15/20: Patient reports changing his medical care to [facility]. He is working with the [place] to get a speech valve for his trach that will work better. Patient reports that his breathing and COPD are currently under control.    . Track and Manage My Symptoms   On track    Timeframe:  Long-Range Goal Priority:  High Start Date: 07/17/20                            Expected End Date: 07/17/21                     Follow Up Date:  03/13/21   - develop a rescue plan - eliminate symptom triggers at home - follow rescue plan if symptoms flare-up - keep follow-up appointments    Why is this important?   Tracking your symptoms and other information about your health helps your doctor plan your care.  Write down the symptoms, the time of day, what you were doing and what medicine you are taking.  You will soon learn how to  manage your symptoms.  Notes: Patient monitors his symptoms closely and follows his rescue plan. He manages his trach without difficulty. Updated: 08/21/20  Updated 11/15/20: Patient reports changing his medical care to [facility]. He is working with the [place] to get a speech valve for his trach that will work better. Patient reports that his breathing and COPD are currently under control.

## 2020-11-15 NOTE — Patient Outreach (Signed)
[facility]) Care Management  [place]  11/15/2020   [name] [name] 09-15-1950 [id number]  Subjective: Successful telephone outreach call to patient. HIPAA identifiers obtained. Patient reports that he is doing well and states that the recent abrupt weather changes have not effected his breathing. Patient explains that his COPD is under control presently. [facility] has been unable to order the normal saline bullets he uses to help suction out his trach and he has been using bottled saline. Patient plans to call [facility] today to check on the status of the bullets. Patient discussed that he is working with [facility] to get a speech valve device that will work better with his trach. Patent denies any sick days and reports he continues to walk and ride his bicycle routinely. Patient states that his home environment is safe and that he is well supported by his family. Patient did not have any further questions or concerns today and did confirm that het has this nurse's contact number to call her if needed.   Encounter Medications:  Outpatient Encounter Medications as of 11/15/2020  Medication Sig Note  . acetaminophen 325 MG tablet Take 2 tablets (650 mg total) by mouth every 6 (six) hours as needed. (Patient taking differently: Take 650 mg by mouth every 6 (six) hours as needed for pain.)   . albuterol (PROVENTIL) (2.5 MG/3ML) 0.083% nebulizer solution Take 3 mLs (2.5 mg total) by nebulization every 4 (four) hours as needed for wheezing or shortness of breath.   Marland Kitchen albuterol (VENTOLIN HFA) 108 (90 Base) MCG/ACT inhaler INHALE 2 PUFFS INTO THE LUNGS EVERY 6 HOURS AS NEEDED (Patient taking differently: Inhale 2 puffs into the lungs every 6 (six) hours as needed for wheezing or shortness of breath.)   . Carboxymethylcellulose Sod PF (REFRESH PLUS) 0.5 % SOLN Place 1 drop into both eyes 2 (two) times daily as needed (dry eyes).   . cetirizine (ZYRTEC) 10 MG tablet Take 10 mg by mouth daily as  needed for allergies.    . finasteride (PROSCAR) 5 MG tablet Take 5 mg by mouth daily.   . fluticasone (FLONASE) 50 MCG/ACT nasal spray Place 2 sprays into both nostrils daily as needed for allergies.    Marland Kitchen guaiFENesin (MUCINEX) 600 MG 12 hr tablet Take 1 tablet (600 mg total) by mouth 2 (two) times daily as needed for to loosen phlegm. (Patient taking differently: Take 600 mg by mouth daily.)   . hydrochlorothiazide (HYDRODIURIL) 25 MG tablet Take 1 tablet (25 mg total) by mouth daily.   Marland Kitchen losartan (COZAAR) 50 MG tablet Take 50 mg by mouth daily.    . mometasone-formoterol (DULERA) 100-5 MCG/ACT AERO Inhale 2 puffs into the lungs 2 (two) times daily.   . Multiple Vitamin (MULTIVITAMIN) tablet Take 1 tablet by mouth daily.   . sodium chloride 0.9 % nebulizer solution Take 3-4 mLs by nebulization daily as needed. Use 3-4 drops into Trach to loosen phlegm suction secretions immediatly after.  11/15/2020: Reports he has not been able to get the bullets and is using NS bottle to measure and put down trach  . terazosin (HYTRIN) 5 MG capsule Take 5 mg by mouth at bedtime.    . docusate sodium (COLACE) 100 MG capsule Take 1 capsule (100 mg total) by mouth 2 (two) times daily as needed for mild constipation. (Patient not taking: Reported on 11/15/2020) 11/15/2020: Not needed  . ibuprofen (ADVIL) 800 MG tablet Take 1 tablet (800 mg total) by mouth every 8 (eight)  hours as needed. (Patient not taking: Reported on 11/15/2020) 11/15/2020: completed  . oxyCODONE (OXY IR/ROXICODONE) 5 MG immediate release tablet Take 1 tablet (5 mg total) by mouth every 6 (six) hours as needed for severe pain. (Patient not taking: Reported on 11/15/2020) 11/15/2020: completed   No facility-administered encounter medications on file as of 11/15/2020.    Functional Status:  In your present state of health, do you have any difficulty performing the following activities: 05/15/2020 04/02/2020  Hearing? N N  Vision? N N  Difficulty concentrating or  making decisions? N N  Walking or climbing stairs? N N  Dressing or bathing? N N  Comment - -  Doing errands, shopping? N [place] and eating ? - N  Comment - -  Using the Toilet? - N  In the past six months, have you accidently leaked urine? - N  Do you have problems with loss of bowel control? - N  Managing your Medications? - N  Managing your Finances? - N  Housekeeping or managing your Housekeeping? - N  Some recent data might be hidden    Fall/Depression Screening: Fall Risk  08/21/2020 07/17/2020 07/17/2020  Falls in the past year? 0 0 0  Number falls in past yr: 0 0 0  Injury with Fall? 0 0 0  Follow up Falls evaluation completed Falls prevention discussed;Education provided;Falls evaluation completed Falls prevention discussed;Education provided;Falls evaluation completed   PHQ 2/9 Scores 08/22/2020 07/17/2020 04/02/2020 12/29/2019 10/26/2019 07/26/2019 06/01/2019  PHQ - 2 Score 0 0 0 0 0 0 0    Assessment:  Goals Addressed            This Visit's Progress   . [facility]) Learn More About My Health   On track    Timeframe:  Long-Range Goal Priority:  Medium Start Date:  07/17/20                          Expected End Date: 07/17/21                     Follow Up Date: 03/13/21   - tell my story and reason for my visit - make a list of questions - ask questions - repeat what I heard to make sure I understand - bring a list of my medicines to the visit - speak up when I don't understand  -Encouraged patient to continue to have his daughter assist and discussed for the patient to contact nurse for questions regarding his health a wellness.    Why is this important?   The best way to learn about your health and care is by talking to the doctor and nurse.  They will answer your questions and give you information in the way that you like best.    Notes: Patient states that he does not read at a high level. Nurse encourages patient to ask questions frequently,  to bring a list of questions and his medications to his provider appointments, and/or contact this nurse for assistance as needed.  Updated: 08/21/20  Updated 11/15/20: Encouraged patient to continue to have his daughter assist and discussed for the patient to contact nurse for questions regarding his health a wellness.         Marland Kitchen [facility]) Track and Manage My Triggers   On track    Timeframe:  Long-Range Goal Priority:  High Start Date: 07/17/20  Expected End Date: 07/17/21                     Follow-up Date: 03/13/21   - avoid second hand smoke - identify and remove indoor air pollutants - limit outdoor activity during cold weather - listen for public air quality announcements every day    Why is this important?   Triggers are activities or things, like tobacco smoke or cold weather, that make your COPD (chronic obstructive pulmonary disease) flare-up.  Knowing these triggers helps you plan how to stay away from them.  When you cannot remove them, you can learn how to manage them.     Notes: Patient states his main trigger currently is cold weather. Patient states he does well if he limits his time outside.  Updated: 08/21/20  Updated 11/15/20: Patient states he is currently doing well and the recent weather changes have not effected his breathing.     . Patient will not have any ED or hospital admission within the next 90 days   On track    [place] (see longtitudinal plan of care for additional care plan information)  Current Barriers:  Marland Kitchen Knowledge deficits related to basic COPD self care/management   Case Manager Clinical Goal(s):  Over the next 90 days patient will report using inhalers as prescribed including rinsing mouth after use  Over the next 90 days patient will report utilizing pursed lip breathing for shortness of breath  Over the next 90 days, patient will be able to verbalize understanding of COPD action plan and when to seek appropriate  levels of medical care  Over the next 90 days, patient will engage in lite exercise as tolerated to build/regain stamina and strength and reduce shortness of breath through activity tolerance  Over the next 90 days, patient will verbalize basic understanding of COPD disease process and self care activities  Over the next 90 days, patient will not be hospitalized for COPD exacerbation  Patient reports that he is following COPD zones and action plans .   Interventions:   Provided patient with basic written and verbal COPD education on self care/management/and exacerbation prevention   Provided patient with COPD action plan and reinforced importance of daily self assessment  Provided written and verbal instructions on pursed lip breathing and utilized returned demonstration as teach back  Advised patient to self assesses COPD action plan zone and make appointment with provider if in the yellow zone for 48 hours without improvement.  Provided patient with education about the role of exercise in the management of COPD  Advised patient to engage in light exercise as tolerated 3-5 days a week  Provided education about and advised patient to utilize infection prevention strategies to reduce risk of respiratory infection   Patient Self Care Activities:  . Takes medications as prescribed including inhalers . Self assesses COPD action plan zone and makes appointment with provider if in the yellow zone for 48 hours without improvement. . Engages in light exercise 3-5 days a week . Utilizes infection prevention strategies to reduce risk of respiratory infection  . Patient manages his trach without difficulty.    . Patient has received this years annual flu shot and plans to get the covid booster during his upcoming [place] appointment.   Timeframe:  Long-Range Goal Priority:  High Start Date:  04/02/20  Expected End Date: 04/02/21 Follow Up Date: 03/13/21                       Please see past updates related to this goal by clicking on the "Past Updates" button in the selected goal    Updated: 08/21/20  Updated 11/15/20: Patient reports changing his medical care to [facility]. He is working with the [facility] to get a speech valve for his trach that will work better. Patient reports that his breathing and COPD are currently under control.    . Track and Manage My Symptoms   On track    Timeframe:  Long-Range Goal Priority:  High Start Date: 07/17/20                            Expected End Date: 07/17/21                     Follow Up Date:  03/13/21   - develop a rescue plan - eliminate symptom triggers at home - follow rescue plan if symptoms flare-up - keep follow-up appointments    Why is this important?   Tracking your symptoms and other information about your health helps your doctor plan your care.  Write down the symptoms, the time of day, what you were doing and what medicine you are taking.  You will soon learn how to manage your symptoms.     Notes: Patient monitors his symptoms closely and follows his rescue plan. He manages his trach without difficulty. Updated: 08/21/20  Updated 11/15/20: Patient reports changing his medical care to [facility]. He is working with the [facility] to get a speech valve for his trach that will work better. Patient reports that his breathing and COPD are currently under control.      Plan: RN Health Coach will send PCP a quarterly update and will call patient within the month of June. Follow-up:  Patient agrees to Care Plan and Follow-up.  Emelia Loron RN, BSN [place] [phone] [email or web address]

## 2020-11-24 IMAGING — DX PORTABLE CHEST - 1 VIEW
1 series · 1 of 1 positions shown · non-contrast
Comparison: CT 12/13/2018.

CLINICAL DATA: Shortness of breath.

EXAM:
PORTABLE CHEST 1 VIEW

[chest ap]
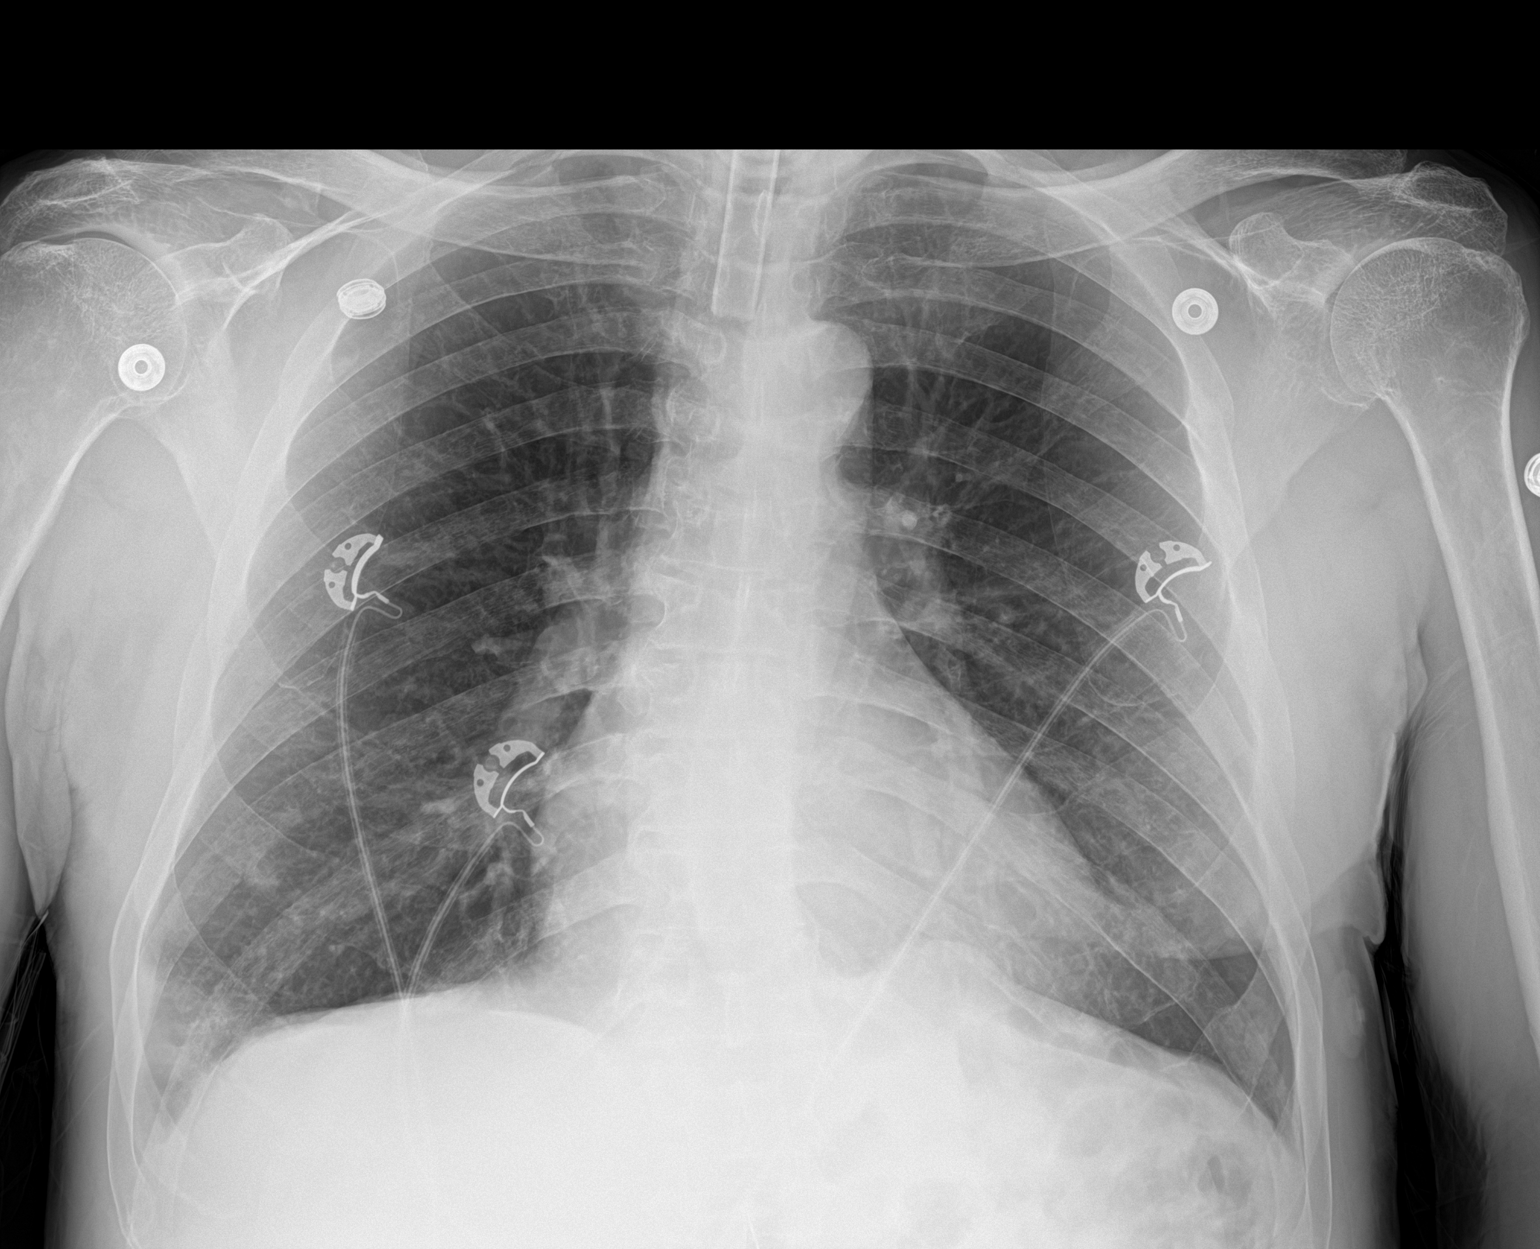

[1 of 1 positions shown; findings below may reference images not displayed]

FINDINGS: Tracheostomy tube noted in stable position. Mediastinum and hilar
structures normal. Heart size normal. Mild density in the periphery
of the right lung base. Atelectasis is infiltrate could present in
this fashion. Infarct cannot be excluded. If need be
contrast-enhanced chest CT can be obtained to further evaluate.
Symmetric bibasilar nodular opacities consistent nipple shadows
again noted. Small right pleural effusion no pneumothorax.
IMPRESSION: 1. Mild density noted in the periphery of the right lung base. This
could represent atelectasis and or infiltrate. Pulmonary infarct
cannot be excluded. If need be contrast-enhanced chest CT can be
obtained to further evaluate.

2.  Exam otherwise unremarkable.

## 2020-11-24 IMAGING — CT CT ANGIOGRAPHY CHEST
2 of 7 series · 19 of 46 positions shown · IV contrast (APPLIED)
Comparison: Radiograph of same day.  CT scan December 13, 2018.

CLINICAL DATA: Shortness of breath.

EXAM:
CT ANGIOGRAPHY CHEST WITH CONTRAST
TECHNIQUE: Multidetector CT imaging of the chest was performed using the
standard protocol during bolus administration of intravenous
contrast. Multiplanar CT image reconstructions and MIPs were
obtained to evaluate the vascular anatomy.
CONTRAST:  56 mL OMNIPAQUE IOHEXOL 350 MG/ML SOLN

[Series 7: thins · axial · 0.70mm/px · z∈[+978,+1225]mm · 16 of 399 slices shown]
[im 23/399  lung]
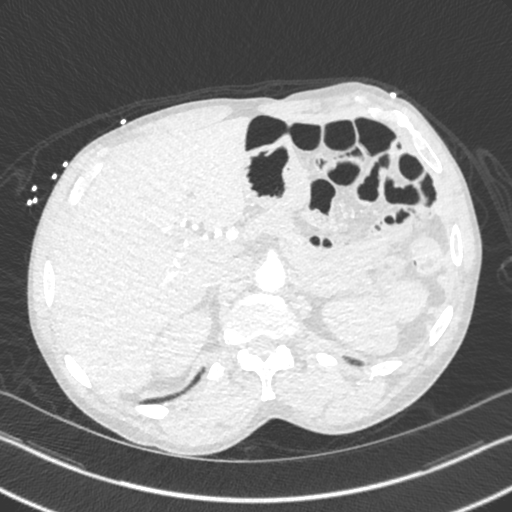
[im 45/399  soft-tissue]
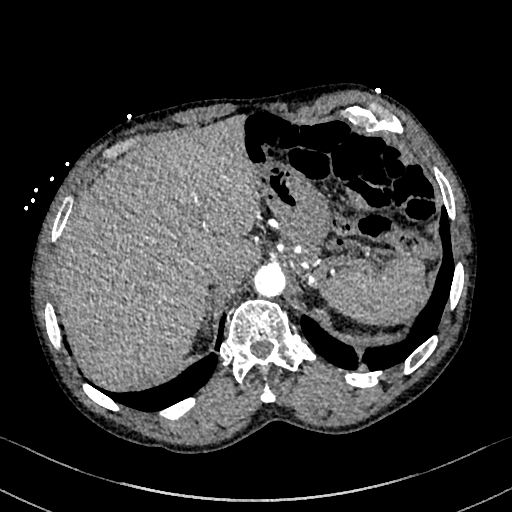
[im 67/399  lung]
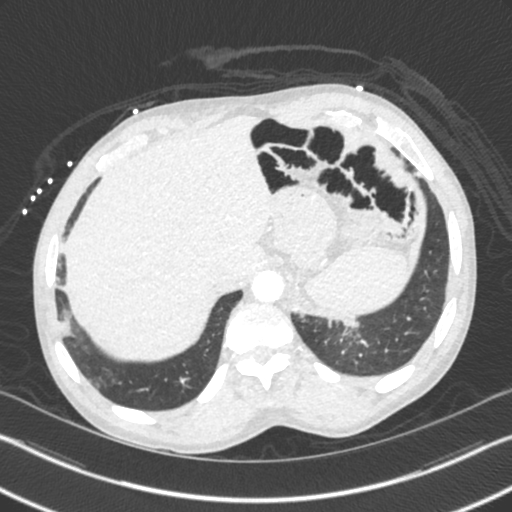
[im 89/399  soft-tissue]
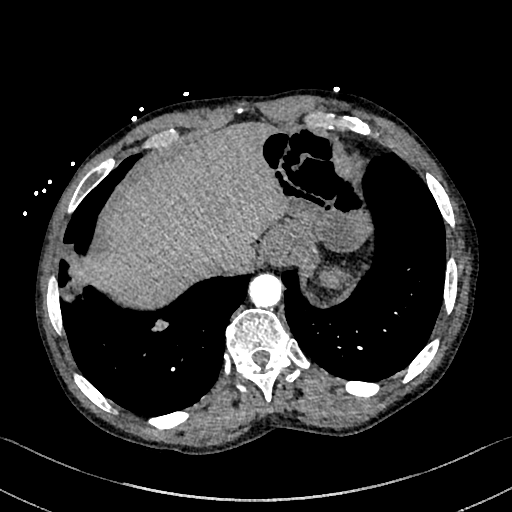
[im 111/399  lung]
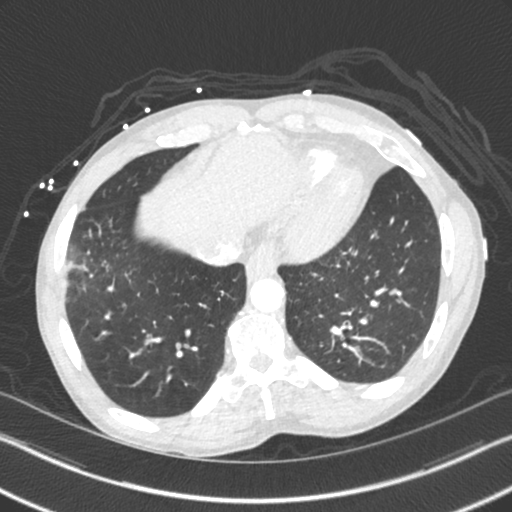
[im 133/399  soft-tissue]
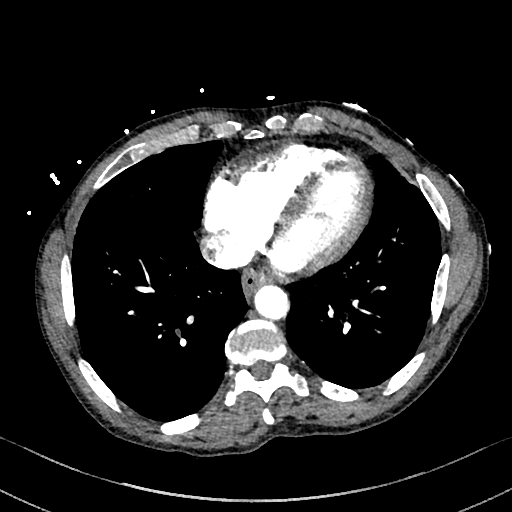
[im 155/399  lung]
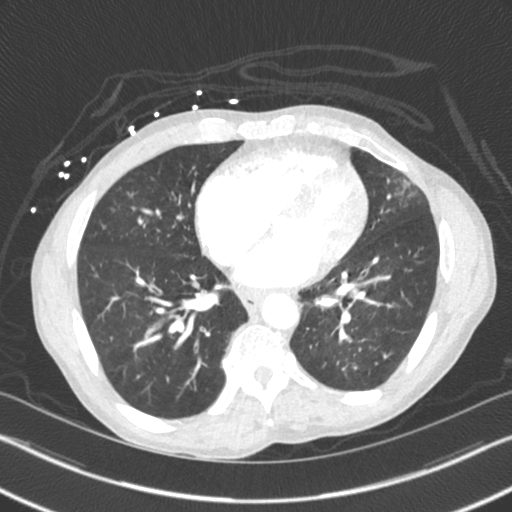
[im 177/399  soft-tissue]
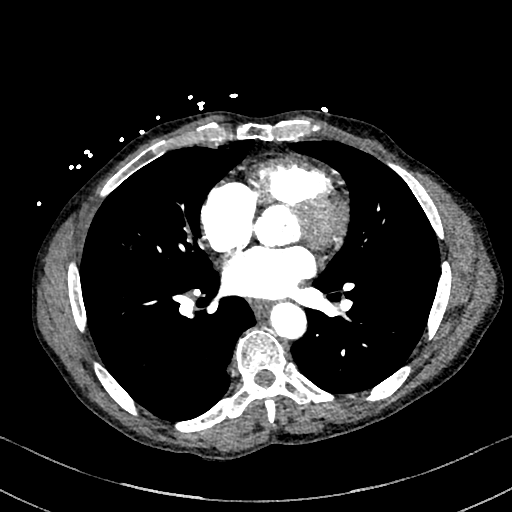
[im 222/399  lung]
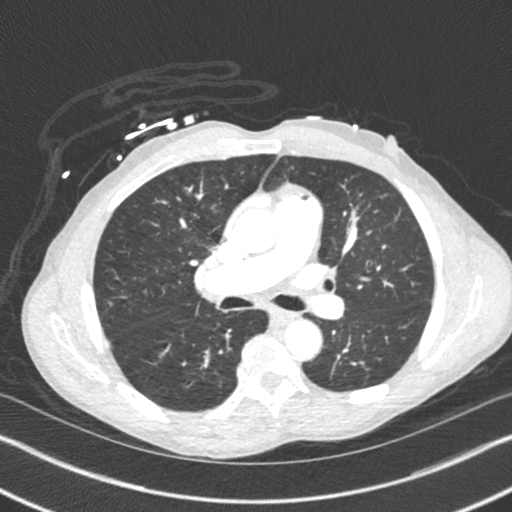
[im 244/399  soft-tissue]
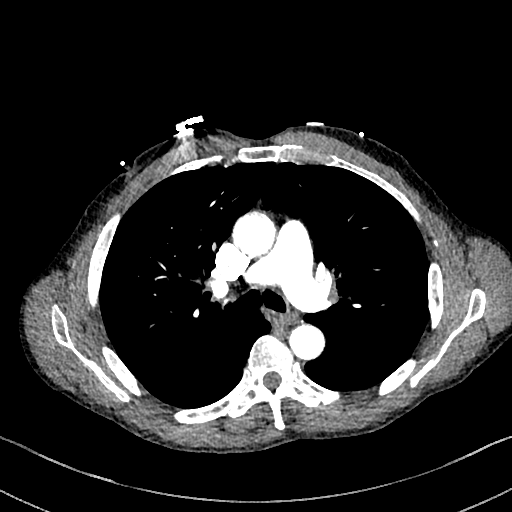
[im 266/399  lung]
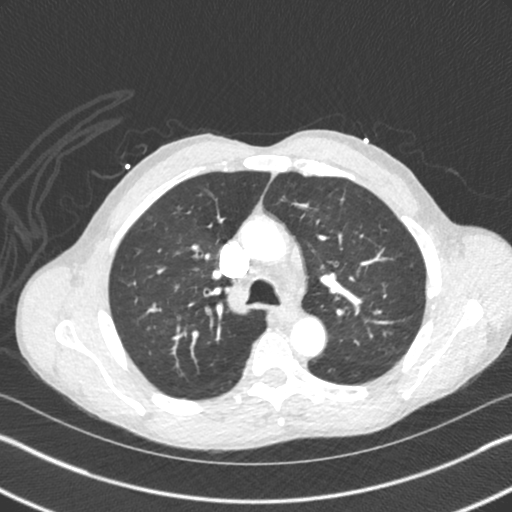
[im 288/399  soft-tissue]
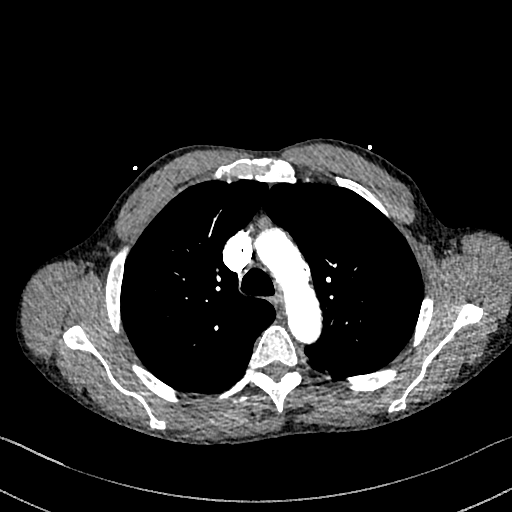
[im 310/399  lung]
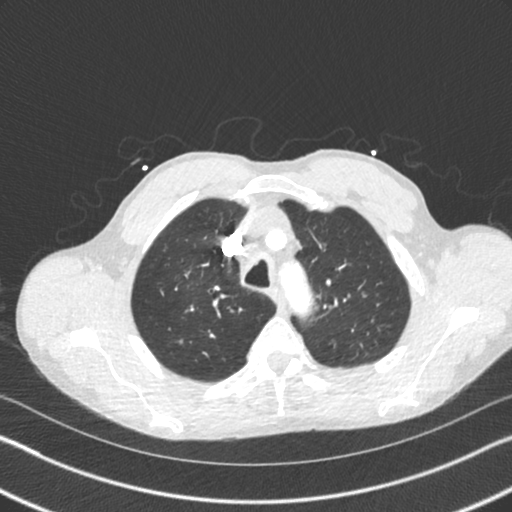
[im 332/399  soft-tissue]
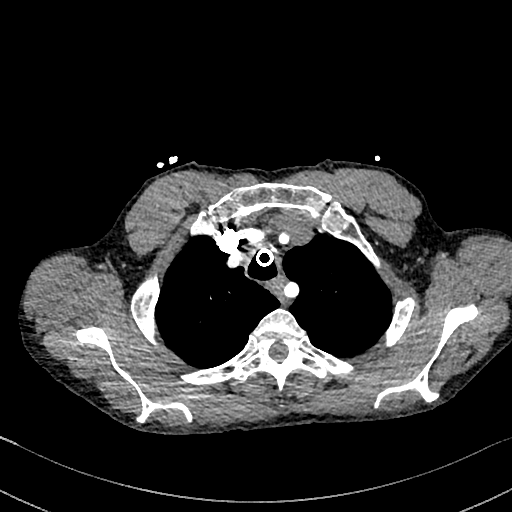
[im 354/399  lung]
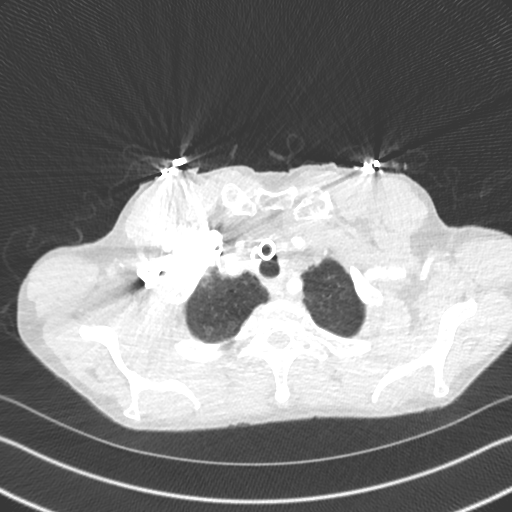
[im 376/399  soft-tissue]
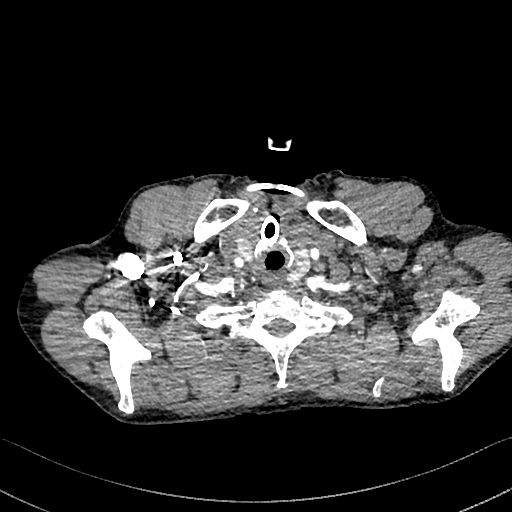

[Series 8: cor · coronal · 0.59mm/px · 3 of 134 slices shown]
[im 34/134  soft-tissue]
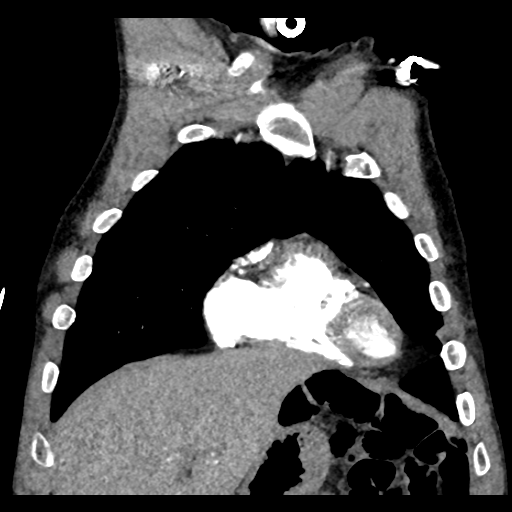
[im 67/134  soft-tissue]
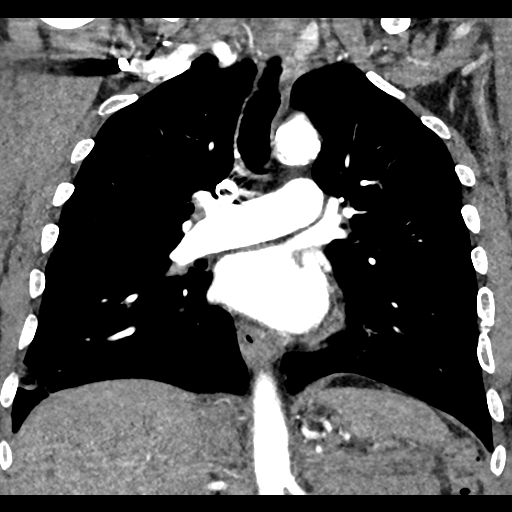
[im 100/134  soft-tissue]
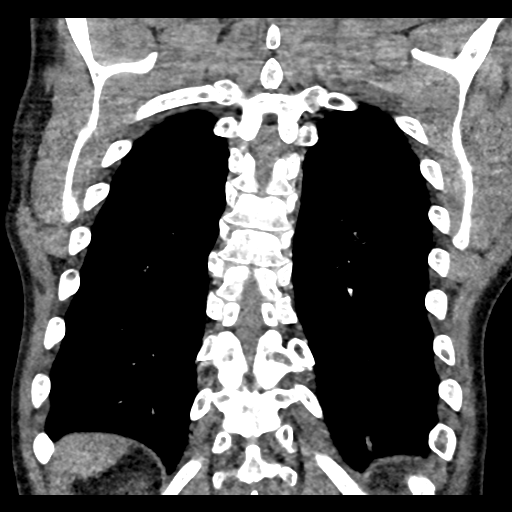

[19 of 46 positions shown; findings below may reference images not displayed]

FINDINGS: Cardiovascular: Satisfactory opacification of the pulmonary arteries
to the segmental level. No evidence of pulmonary embolism. Normal
heart size. No pericardial effusion. Atherosclerosis of thoracic
aorta is noted without aneurysm or dissection.

Mediastinum/Nodes: Tracheostomy tube is in grossly good position.
Thyroid gland is unremarkable. No adenopathy is noted. Esophagus is
unremarkable.

Lungs/Pleura: No pneumothorax or pleural effusion is noted. Airspace
opacity is noted in lateral portion of right costophrenic sulcus
concerning for pneumonia. Another smaller opacity is noted more
posteriorly in the right lung base. Smaller opacities are also noted
in the left posterior costophrenic sulcus and lingular segment of
the left upper lobe. This is concerning for possible multifocal
pneumonia.

Upper Abdomen: No acute abnormality.

Musculoskeletal: No chest wall abnormality. No acute or significant
osseous findings.

Review of the MIP images confirms the above findings.
IMPRESSION: No definite evidence of pulmonary embolus.

Multifocal airspace opacities are noted in both lungs, with the
largest noted in the right lateral costophrenic sulcus. This is
concerning for multifocal pneumonia.

Tracheostomy tube is in grossly good position.

Aortic Atherosclerosis ([id number]-V87.7).

## 2020-11-29 DIAGNOSIS — Z430 Encounter for attention to tracheostomy: Secondary | ICD-10-CM | POA: Diagnosis not present

## 2020-12-11 DIAGNOSIS — E785 Hyperlipidemia, unspecified: Secondary | ICD-10-CM | POA: Diagnosis not present

## 2020-12-11 DIAGNOSIS — K219 Gastro-esophageal reflux disease without esophagitis: Secondary | ICD-10-CM | POA: Diagnosis not present

## 2020-12-11 DIAGNOSIS — J449 Chronic obstructive pulmonary disease, unspecified: Secondary | ICD-10-CM | POA: Diagnosis not present

## 2020-12-11 DIAGNOSIS — J45909 Unspecified asthma, uncomplicated: Secondary | ICD-10-CM | POA: Diagnosis not present

## 2020-12-11 DIAGNOSIS — I10 Essential (primary) hypertension: Secondary | ICD-10-CM | POA: Diagnosis not present

## 2020-12-11 DIAGNOSIS — J4530 Mild persistent asthma, uncomplicated: Secondary | ICD-10-CM | POA: Diagnosis not present

## 2020-12-12 IMAGING — DX CHEST - 2 VIEW
2 series · 2 of 2 positions shown · non-contrast
Comparison: Radiographs 02/10/2019 and 11/29/2018.  CT 02/10/2019.

CLINICAL DATA: Cough with dyspnea on exertion. History of asthma,
pneumonia and tracheostomy.

EXAM:
CHEST - 2 VIEW

[chest pa]
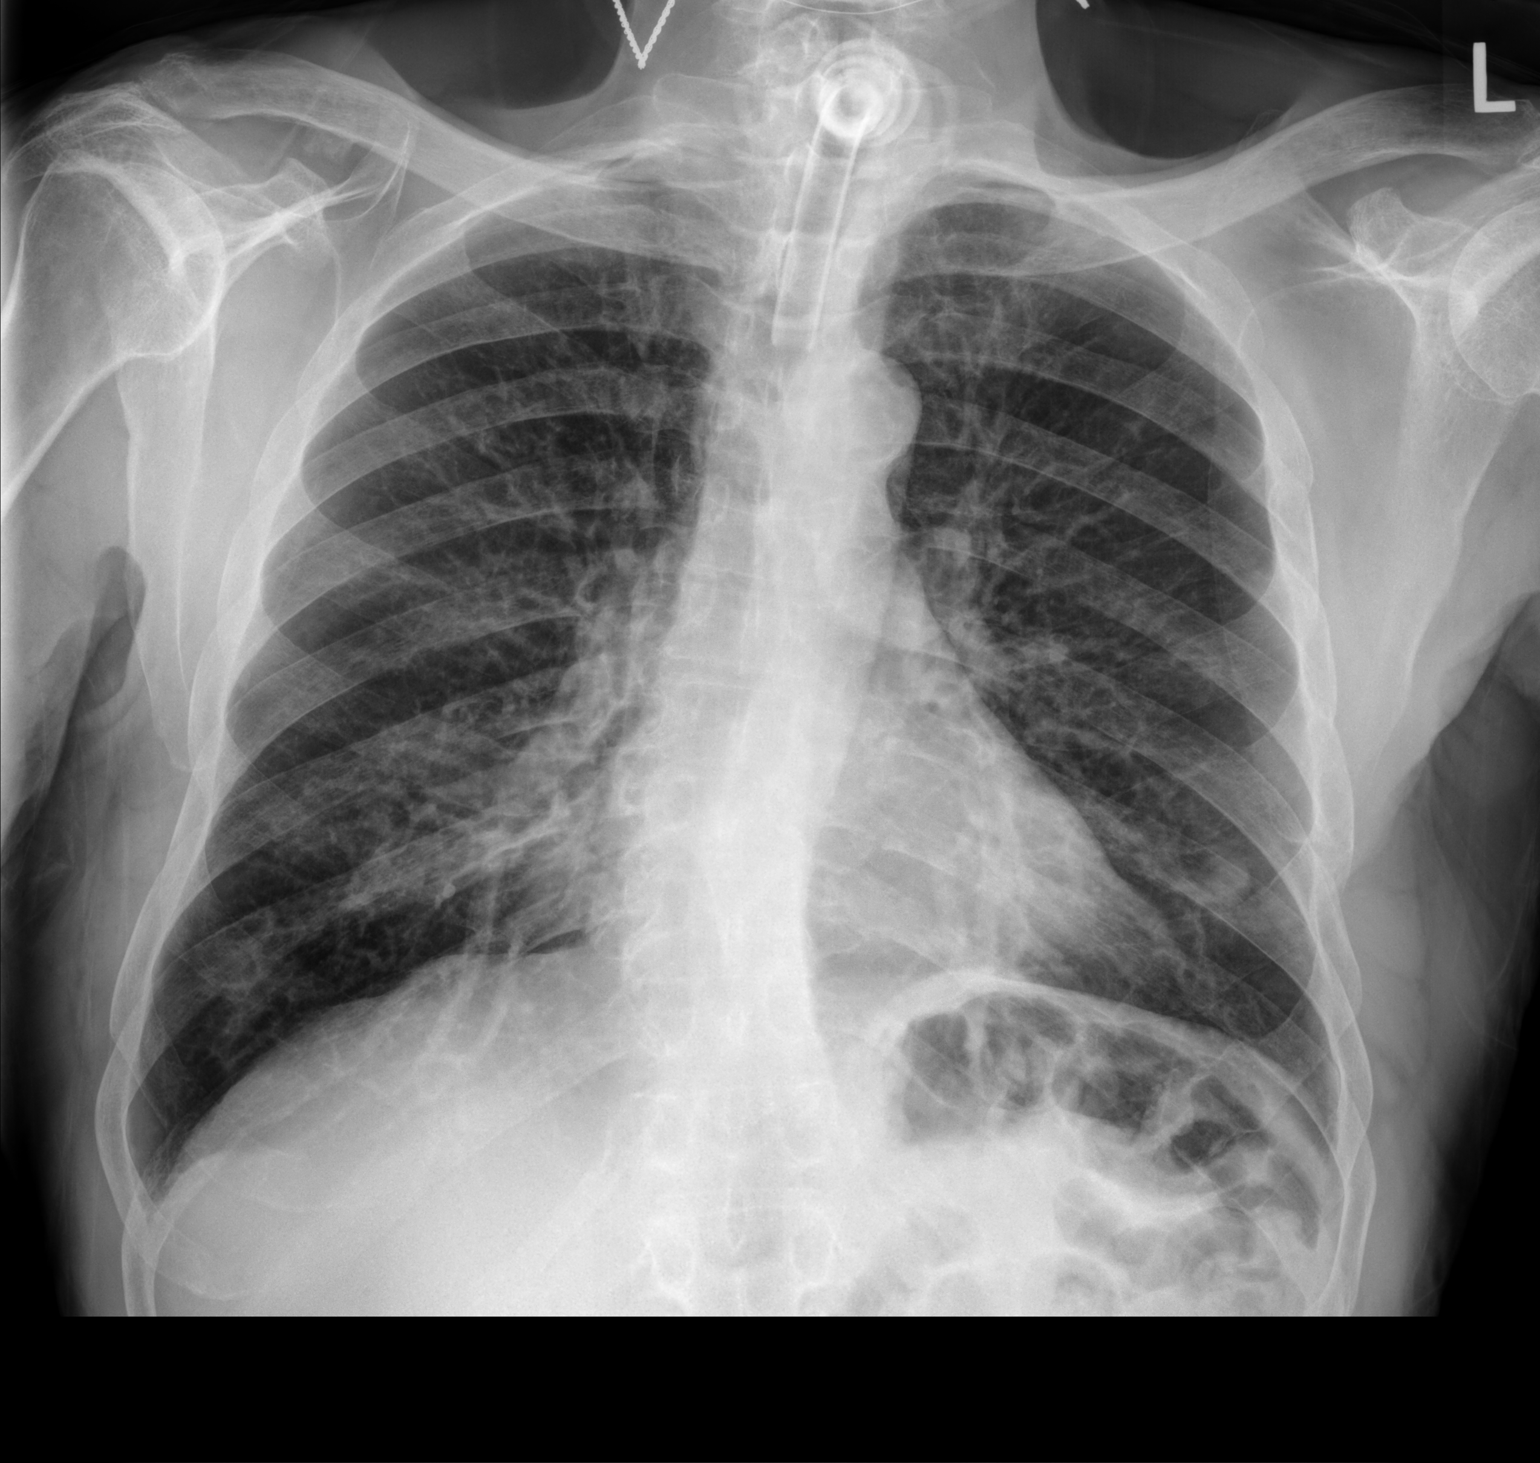

[chest lat]
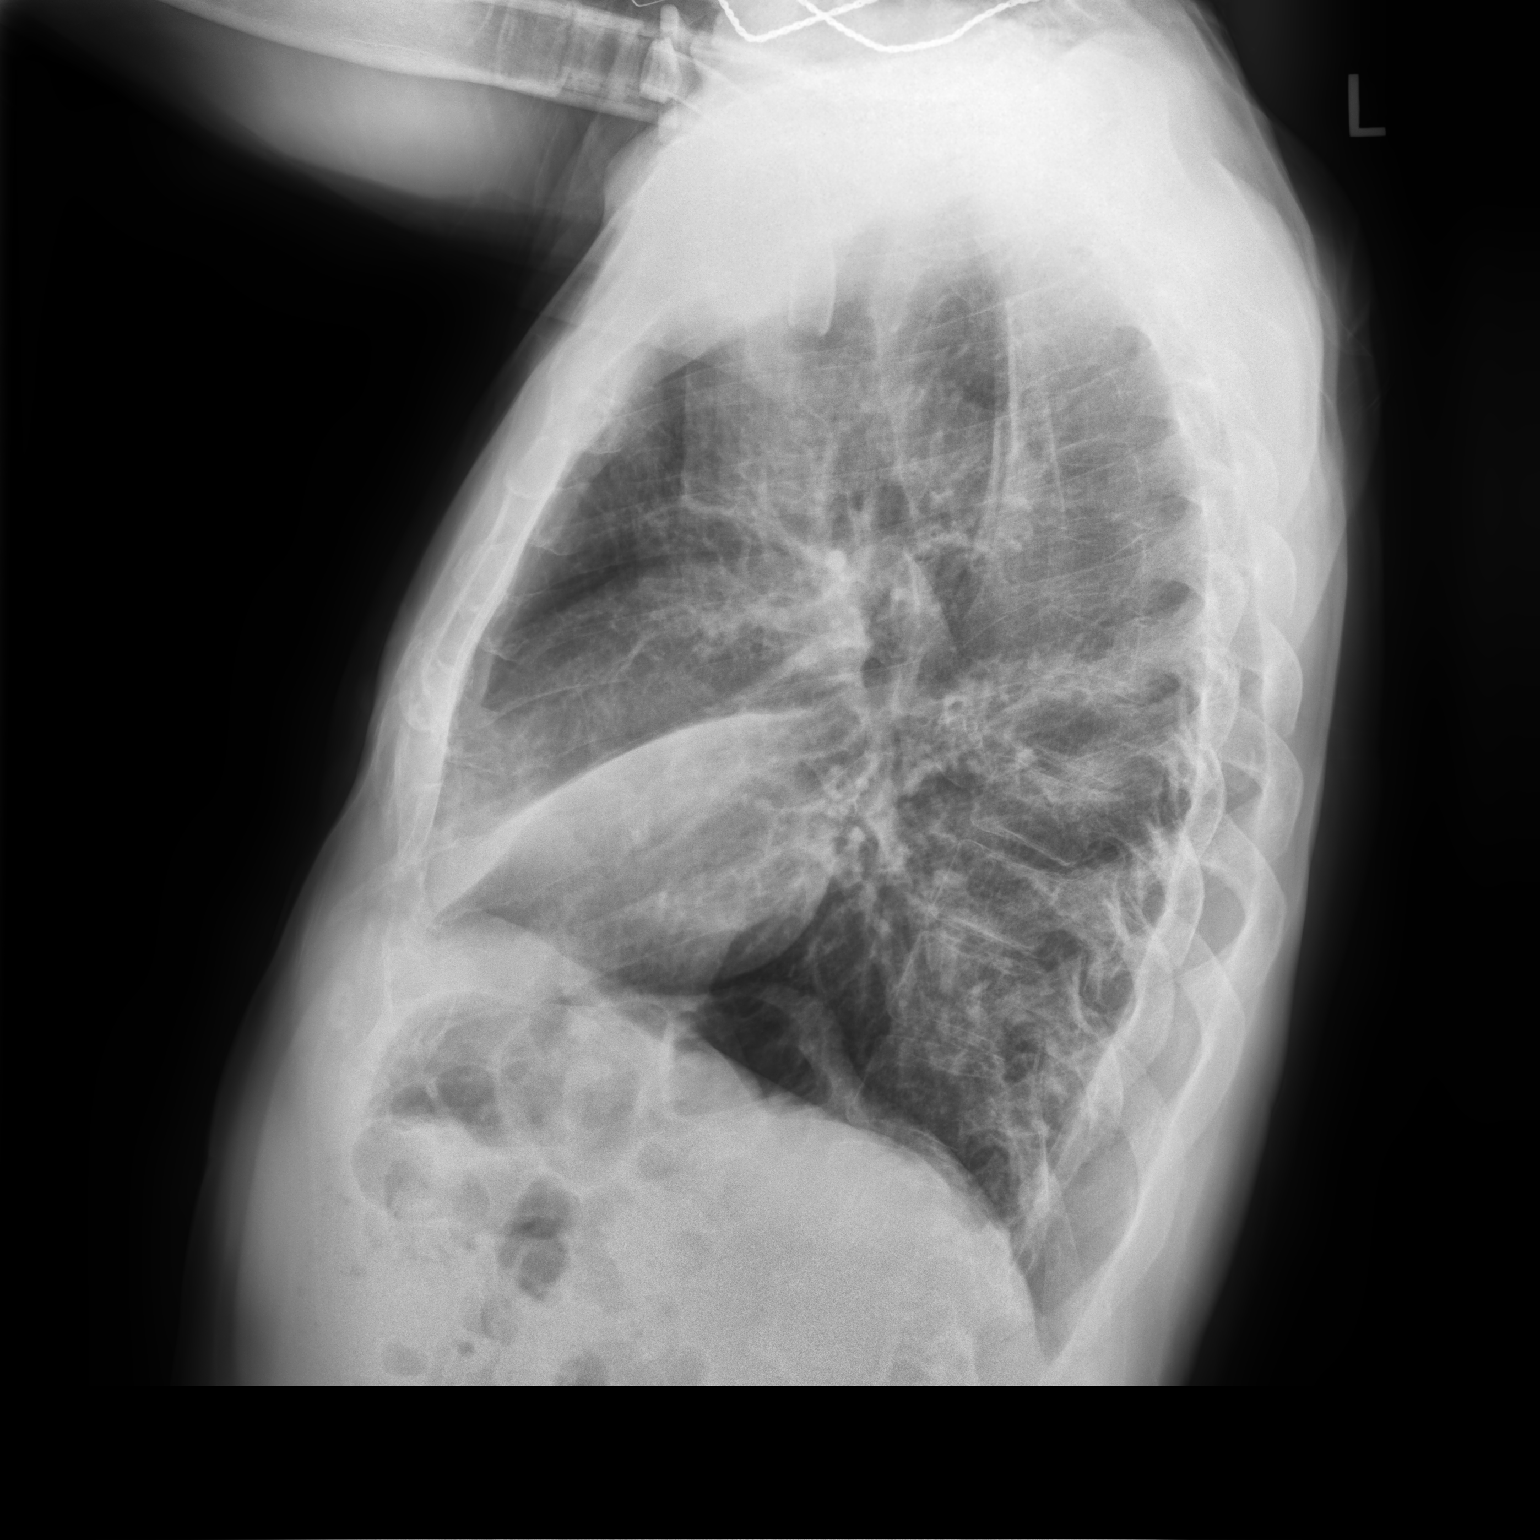

[2 of 2 positions shown; findings below may reference images not displayed]

FINDINGS: The tracheostomy appears well positioned. The heart size and
mediastinal contours are stable. There is mild aortic
atherosclerosis. There is recurrent airspace disease in volume loss
in the right middle lobe. No significant residual right lower lobe
airspace disease is seen as demonstrated on CT. There is diffuse
central airway thickening. No pneumothorax or significant pleural
effusion. The bones appear unchanged.
IMPRESSION: Fluctuating right middle lobe opacity and volume loss, similar to
radiographs from 11/29/2018 but resolved on more recent CT of less
than 3 weeks ago. The right lower lobe infiltrate appears resolved.

## 2020-12-26 DIAGNOSIS — Z930 Tracheostomy status: Secondary | ICD-10-CM | POA: Diagnosis not present

## 2021-01-01 DIAGNOSIS — Z430 Encounter for attention to tracheostomy: Secondary | ICD-10-CM | POA: Diagnosis not present

## 2021-01-17 DIAGNOSIS — R7303 Prediabetes: Secondary | ICD-10-CM | POA: Diagnosis not present

## 2021-01-17 DIAGNOSIS — J3802 Paralysis of vocal cords and larynx, bilateral: Secondary | ICD-10-CM | POA: Diagnosis not present

## 2021-01-17 DIAGNOSIS — E785 Hyperlipidemia, unspecified: Secondary | ICD-10-CM | POA: Diagnosis not present

## 2021-01-17 DIAGNOSIS — Z0000 Encounter for general adult medical examination without abnormal findings: Secondary | ICD-10-CM | POA: Diagnosis not present

## 2021-01-17 DIAGNOSIS — J4530 Mild persistent asthma, uncomplicated: Secondary | ICD-10-CM | POA: Diagnosis not present

## 2021-01-17 DIAGNOSIS — L84 Corns and callosities: Secondary | ICD-10-CM | POA: Diagnosis not present

## 2021-01-17 DIAGNOSIS — I10 Essential (primary) hypertension: Secondary | ICD-10-CM | POA: Diagnosis not present

## 2021-01-31 DIAGNOSIS — Z430 Encounter for attention to tracheostomy: Secondary | ICD-10-CM | POA: Diagnosis not present

## 2021-02-06 ENCOUNTER — Encounter (HOSPITAL_COMMUNITY): Payer: Self-pay | Admitting: Emergency Medicine

## 2021-02-06 ENCOUNTER — Emergency Department (HOSPITAL_COMMUNITY)
Admission: EM | Admit: 2021-02-06 | Discharge: 2021-02-07 | Disposition: A | Discharge status: Home / Self Care | Payer: No Typology Code available for payment source | Attending: Emergency Medicine | Admitting: Emergency Medicine

## 2021-02-06 DIAGNOSIS — B9689 Other specified bacterial agents as the cause of diseases classified elsewhere: Secondary | ICD-10-CM | POA: Insufficient documentation

## 2021-02-06 DIAGNOSIS — N3001 Acute cystitis with hematuria: Secondary | ICD-10-CM | POA: Diagnosis not present

## 2021-02-06 DIAGNOSIS — Z7951 Long term (current) use of inhaled steroids: Secondary | ICD-10-CM | POA: Insufficient documentation

## 2021-02-06 DIAGNOSIS — R319 Hematuria, unspecified: Secondary | ICD-10-CM

## 2021-02-06 DIAGNOSIS — J441 Chronic obstructive pulmonary disease with (acute) exacerbation: Secondary | ICD-10-CM | POA: Diagnosis not present

## 2021-02-06 DIAGNOSIS — J4530 Mild persistent asthma, uncomplicated: Secondary | ICD-10-CM | POA: Diagnosis not present

## 2021-02-06 DIAGNOSIS — Z79899 Other long term (current) drug therapy: Secondary | ICD-10-CM | POA: Insufficient documentation

## 2021-02-06 DIAGNOSIS — I10 Essential (primary) hypertension: Secondary | ICD-10-CM | POA: Insufficient documentation

## 2021-02-06 LAB — URINALYSIS, ROUTINE W REFLEX MICROSCOPIC
Bilirubin Urine: NEGATIVE
Glucose, UA: NEGATIVE mg/dL
Ketones, ur: NEGATIVE mg/dL
Nitrite: NEGATIVE
Protein, ur: NEGATIVE mg/dL
RBC / HPF: >50 RBC/hpf — ABNORMAL HIGH (ref 0–5)
Specific Gravity, Urine: 1.011 (ref 1.005–1.030)
WBC, UA: >50 WBC/hpf — ABNORMAL HIGH (ref 0–5)
pH: 5 (ref 5.0–8.0)

## 2021-02-06 LAB — CBC WITH DIFFERENTIAL/PLATELET
Abs Immature Granulocytes: 0.18 10*3/uL — ABNORMAL HIGH (ref 0.00–0.07)
Basophils Absolute: 0 10*3/uL (ref 0.0–0.1)
Basophils Relative: 0 %
Eosinophils Absolute: 0 10*3/uL (ref 0.0–0.5)
Eosinophils Relative: 0 %
HCT: 38.8 % — ABNORMAL LOW (ref 39.0–52.0)
Hemoglobin: 13.4 g/dL (ref 13.0–17.0)
Immature Granulocytes: 1 %
Lymphocytes Relative: 4 %
Lymphs Abs: 0.8 10*3/uL (ref 0.7–4.0)
MCH: 31.2 pg (ref 26.0–34.0)
MCHC: 34.5 g/dL (ref 30.0–36.0)
MCV: 90.4 fL (ref 80.0–100.0)
Monocytes Absolute: 1.3 10*3/uL — ABNORMAL HIGH (ref 0.1–1.0)
Monocytes Relative: 7 %
Neutro Abs: 18.1 10*3/uL — ABNORMAL HIGH (ref 1.7–7.7)
Neutrophils Relative %: 88 %
Platelets: 254 10*3/uL (ref 150–400)
RBC: 4.29 MIL/uL (ref 4.22–5.81)
RDW: 13.3 % (ref 11.5–15.5)
WBC: 20.5 10*3/uL — ABNORMAL HIGH (ref 4.0–10.5)
nRBC: 0 % (ref 0.0–0.2)

## 2021-02-06 LAB — COMPREHENSIVE METABOLIC PANEL
ALT: 61 U/L — ABNORMAL HIGH (ref 0–44)
AST: 66 U/L — ABNORMAL HIGH (ref 15–41)
Albumin: 3.5 g/dL (ref 3.5–5.0)
Alkaline Phosphatase: 105 U/L (ref 38–126)
Anion gap: 11 (ref 5–15)
BUN: 16 mg/dL (ref 8–23)
CO2: 23 mmol/L (ref 22–32)
Calcium: 9 mg/dL (ref 8.9–10.3)
Chloride: 98 mmol/L (ref 98–111)
Creatinine, Ser: 0.9 mg/dL (ref 0.61–1.24)
GFR, Estimated: >60 mL/min (ref >60)
Glucose, Bld: 127 mg/dL — ABNORMAL HIGH (ref 70–99)
Potassium: 3.6 mmol/L (ref 3.5–5.1)
Sodium: 132 mmol/L — ABNORMAL LOW (ref 135–145)
Total Bilirubin: 0.6 mg/dL (ref 0.3–1.2)
Total Protein: 7.5 g/dL (ref 6.5–8.1)

## 2021-02-06 LAB — LACTIC ACID, PLASMA: Lactic Acid, Venous: 0.9 mmol/L (ref 0.5–1.9)

## 2021-02-06 MED ORDER — CEPHALEXIN 250 MG PO CAPS: 1000.0000 mg | ORAL_CAPSULE | Freq: Once | ORAL | Status: DC

## 2021-02-06 NOTE — ED Provider Notes (Signed)
Emergency Medicine Provider Triage Evaluation Note  [name] [name] , a 70 y.o. male  was evaluated in triage.  Pt complains of dysuria, urgency, difficulty urinating and urinary retention. States he has been self cathing at home when he feels full but he reportedly still had 325cc in the bladder after self cathing  Review of Systems  Positive: Dysuria, urgency, difficulty urinating and urinary retention Negative: vomiting  Physical Exam  BP 122/84 (BP Location: Right Arm)   Pulse (!) 120   Temp 100.3 F (37.9 C) (Oral)   Resp (!) 26   SpO2 93%  Gen:   Awake, no distress   Resp:  Normal effort  MSK:   Moves extremities without difficulty  Other:  Suprapubic ttp  Medical Decision Making  Medically screening exam initiated at 9:56 PM.  Appropriate orders placed.  [name] [name] was informed that the remainder of the evaluation will be completed by another provider, this initial triage assessment does not replace that evaluation, and the importance of remaining in the ED until their evaluation is complete.     [name] 02/06/21 [id number]    Pattricia Boss, MD 02/06/21 2213

## 2021-02-06 NOTE — ED Triage Notes (Signed)
Pt reports he is unable to urinate, even when he self caths.  He now reports blood in urine, pink tinged.
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
## 2021-02-07 MED ORDER — CEPHALEXIN 500 MG PO CAPS: 500.0000 mg | ORAL_CAPSULE | Freq: Four times a day (QID) | ORAL | 0 refills | Status: DC

## 2021-02-07 MED ORDER — SODIUM CHLORIDE 0.9 % IV SOLN
2.0000 g | Freq: Once | INTRAVENOUS | Status: AC
  Administered 2021-02-07: 2 g via INTRAVENOUS
  Filled 2021-02-07: qty 20

## 2021-02-07 MED ORDER — SODIUM CHLORIDE 0.9 % IV BOLUS
1000.0000 mL | Freq: Once | INTRAVENOUS | Status: AC
  Administered 2021-02-07: 1000 mL via INTRAVENOUS

## 2021-02-07 NOTE — ED Notes (Signed)
Bladder scan detected 126.

## 2021-02-09 LAB — URINE CULTURE: Culture: >=100000 — AB

## 2021-02-10 ENCOUNTER — Telehealth: Payer: Self-pay | Admitting: Emergency Medicine

## 2021-02-10 NOTE — Telephone Encounter (Signed)
Post ED Visit - Positive Culture Follow-up  Culture report reviewed by antimicrobial stewardship pharmacist: [place] Team []  Elenor Quinones, Pharm.D. []  Heide Guile, Pharm.D., BCPS AQ-ID []  Parks Neptune, Pharm.D., BCPS []  Alycia Rossetti, Pharm.D., BCPS []  [place], Pharm.D., BCPS, AAHIVP []  Legrand Como, Pharm.D., BCPS, AAHIVP [x]  Salome Arnt, PharmD, BCPS []  Johnnette Gourd, PharmD, BCPS []  Hughes Better, PharmD, BCPS []  Leeroy Cha, PharmD []  Laqueta Linden, PharmD, BCPS []  Albertina Parr, PharmD  [place] Team []  Leodis Sias, PharmD []  Lindell Spar, PharmD []  Royetta Asal, PharmD []  Graylin Shiver, Rph []  Rema Fendt) Glennon Mac, PharmD []  Arlyn Dunning, PharmD []  Netta Cedars, PharmD []  Dia Sitter, PharmD []  Leone Haven, PharmD []  Gretta Arab, PharmD []  Theodis Shove, PharmD []  Peggyann Juba, PharmD []  Reuel Boom, PharmD   Positive urine culture Treated with cephalexin, organism sensitive to the same and no further patient follow-up is required at this time.  Hazle Nordmann 02/10/2021, 10:14 AM

## 2021-02-12 ENCOUNTER — Ambulatory Visit: Payer: Self-pay | Admitting: *Deleted

## 2021-02-12 LAB — CULTURE, BLOOD (ROUTINE X 2)
Culture: NO GROWTH
Culture: NO GROWTH
Special Requests: ADEQUATE

## 2021-02-13 ENCOUNTER — Other Ambulatory Visit: Payer: Self-pay | Admitting: *Deleted

## 2021-02-13 NOTE — Patient Instructions (Signed)
Goals Addressed            This Visit's Progress   . [facility]) Learn More About My Health       Timeframe:  Long-Range Goal Priority:  Medium Start Date:  07/17/20                          Expected End Date: 07/17/21                     Follow Up Date: 06/13/21   - tell my story and reason for my visit - make a list of questions - ask questions - repeat what I heard to make sure I understand - bring a list of my medicines to the visit - speak up when I don't understand  -Encouraged patient to continue to have his daughter assist and discussed for the patient to contact nurse for questions regarding his health a wellness.    Why is this important?   The best way to learn about your health and care is by talking to the doctor and nurse.  They will answer your questions and give you information in the way that you like best.    Notes: Patient states that he does not read at a high level. Nurse encourages patient to ask questions frequently, to bring a list of questions and his medications to his provider appointments, and/or contact this nurse for assistance as needed.  Updated: 08/21/20  Updated 11/15/20: Encouraged patient to continue to have his daughter assist and discussed for the patient to contact nurse for questions regarding his health a wellness.         [facility]) Track and Manage My Symptoms       Timeframe:  Long-Range Goal Priority:  High Start Date: 07/17/20                            Expected End Date: 07/17/21                     Follow Up Date:  06/13/21   - develop a rescue plan - eliminate symptom triggers at home - follow rescue plan if symptoms flare-up - keep follow-up appointments    Why is this important?   Tracking your symptoms and other information about your health helps your doctor plan your care.  Write down the symptoms, the time of day, what you were doing and what medicine you are taking.  You will soon learn how to manage your symptoms.     Notes:  Patient monitors his symptoms closely and follows his rescue plan. He manages his trach without difficulty. Updated: 08/21/20  Updated 11/15/20: Patient reports changing his medical care to [facility] He is working with the [facility] to get a speech valve for his trach that will work better. Patient reports that his breathing and COPD are currently under control.  02/13/21: Patient reports being at baseline with his COPD. He continues to follow his rescue action plan, uses NS to moisten secretions when he suctions his trach, and uses his rescue nebulizer as needed.    [facility]) Track and Manage My Triggers       Timeframe:  Long-Range Goal Priority:  High Start Date: 07/17/20                           Expected  End Date: 07/17/21                     Follow-up Date: 03/13/21   - avoid second hand smoke - identify and remove indoor air pollutants - limit outdoor activity during cold weather - listen for public air quality announcements every day    Why is this important?   Triggers are activities or things, like tobacco smoke or cold weather, that make your COPD (chronic obstructive pulmonary disease) flare-up.  Knowing these triggers helps you plan how to stay away from them.  When you cannot remove them, you can learn how to manage them.     Notes: Patient states his main trigger currently is cold weather. Patient states he does well if he limits his time outside.  Updated: 08/21/20  Updated 11/15/20: Patient states he is currently doing well and the recent weather changes have not effected his breathing.   02/13/21: Patient reports being at baseline with his COPD. He continues to follow his rescue action plan, uses NS to moisten secretions when he suctions his trach, and uses his rescue nebulizer as needed.

## 2021-02-13 NOTE — Patient Outreach (Signed)
[facility]) Care Management  [place]  02/13/2021   Aashir Umholtz [birth year] [id number]  Subjective: Successful telephone outreach call to patient. HIPAA identifiers obtained. Patient reports that he has not been able to fully eliminate his bladder. As a result, [facility] and directed him to self-cath every 8 hours and as needed. Patient presented to the ED on 02/06/21 due to the catheterizations were not producing good urine output. Patient was discharged and placed on a antibiotic. His urine culture did come back positive and the antibiotic that he was prescribed does cover the bacteria. Patient reports feeling much better and continues to take the antibiotic. Patient states that a [place] nurse did review proper technique for self-catheterization and this nurse did as well. Patient reports that he does have all the catheter supplies that he needs and he is using a sterilely wrapped catheter with each catheterization. He explains that he is now being extremely careful to be as clean as possible and he is thoroughly washing his hands and penis before he opens the catheter to insert. Patient states that his COPD is currently at baseline. He continues to use NS to moisten his secretions when he suctions his trach and he uses his rescue nebulizer as needed. Patient did not have any further questions or concerns today and did confirm that he has this nurse's contact number to call her if needed.   Encounter Medications:  Outpatient Encounter Medications as of 02/13/2021  Medication Sig Note  . acetaminophen 325 MG tablet Take 2 tablets (650 mg total) by mouth every 6 (six) hours as needed. (Patient taking differently: Take 650 mg by mouth every 6 (six) hours as needed for pain.)   . albuterol (PROVENTIL) (2.5 MG/3ML) 0.083% nebulizer solution Take 3 mLs (2.5 mg total) by nebulization every 4 (four) hours as needed for wheezing or shortness of breath.   Marland Kitchen albuterol  (VENTOLIN HFA) 108 (90 Base) MCG/ACT inhaler INHALE 2 PUFFS INTO THE LUNGS EVERY 6 HOURS AS NEEDED (Patient taking differently: Inhale 2 puffs into the lungs every 6 (six) hours as needed for wheezing or shortness of breath.)   . Carboxymethylcellulose Sod PF (REFRESH PLUS) 0.5 % SOLN Place 1 drop into both eyes 2 (two) times daily as needed (dry eyes).   . cephALEXin (KEFLEX) 500 MG capsule Take 1 capsule (500 mg total) by mouth 4 (four) times daily.   . cetirizine (ZYRTEC) 10 MG tablet Take 10 mg by mouth daily as needed for allergies.    Marland Kitchen docusate sodium (COLACE) 100 MG capsule Take 1 capsule (100 mg total) by mouth 2 (two) times daily as needed for mild constipation. 11/15/2020: Not needed  . finasteride (PROSCAR) 5 MG tablet Take 5 mg by mouth daily.   . fluticasone (FLONASE) 50 MCG/ACT nasal spray Place 2 sprays into both nostrils daily as needed for allergies.    Marland Kitchen guaiFENesin (MUCINEX) 600 MG 12 hr tablet Take 1 tablet (600 mg total) by mouth 2 (two) times daily as needed for to loosen phlegm. (Patient taking differently: Take 600 mg by mouth daily.)   . hydrochlorothiazide (HYDRODIURIL) 25 MG tablet Take 1 tablet (25 mg total) by mouth daily.   Marland Kitchen ibuprofen (ADVIL) 800 MG tablet Take 1 tablet (800 mg total) by mouth every 8 (eight) hours as needed. 11/15/2020: completed  . losartan (COZAAR) 50 MG tablet Take 50 mg by mouth daily.    . mometasone-formoterol (DULERA) 100-5 MCG/ACT AERO Inhale 2 puffs into the  lungs 2 (two) times daily.   . Multiple Vitamin (MULTIVITAMIN) tablet Take 1 tablet by mouth daily.   . sodium chloride 0.9 % nebulizer solution Take 3-4 mLs by nebulization daily as needed. Use 3-4 drops into Trach to loosen phlegm suction secretions immediatly after.  11/15/2020: Reports he has not been able to get the bullets and is using NS bottle to measure and put down trach  . terazosin (HYTRIN) 5 MG capsule Take 5 mg by mouth at bedtime.    Marland Kitchen oxyCODONE (OXY IR/ROXICODONE) 5 MG immediate  release tablet Take 1 tablet (5 mg total) by mouth every 6 (six) hours as needed for severe pain. (Patient not taking: No sig reported) 02/13/2021: completed   No facility-administered encounter medications on file as of 02/13/2021.    Functional Status:  In your present state of health, do you have any difficulty performing the following activities: 05/15/2020 04/02/2020  Hearing? N N  Vision? N N  Difficulty concentrating or making decisions? N N  Walking or climbing stairs? N N  Dressing or bathing? N N  Doing errands, shopping? N N  Preparing Food and eating ? - N  Using the Toilet? - N  In the past six months, have you accidently leaked urine? - N  Do you have problems with loss of bowel control? - N  Managing your Medications? - N  Managing your Finances? - N  Housekeeping or managing your Housekeeping? - N  Some recent data might be hidden    Fall/Depression Screening: Fall Risk  11/15/2020 11/15/2020 08/21/2020  Falls in the past year? 0 0 0  Number falls in past yr: 0 0 0  Injury with Fall? 0 0 0  Follow up Falls prevention discussed;Education provided;Falls evaluation completed Falls prevention discussed;Education provided;Falls evaluation completed Falls evaluation completed   PHQ 2/9 Scores 11/15/2020 08/22/2020 07/17/2020 04/02/2020 12/29/2019 10/26/2019 07/26/2019  PHQ - 2 Score 0 0 0 0 0 0 0    Assessment:  Goals Addressed            This Visit's Progress   . [facility]) Learn More About My Health       Timeframe:  Long-Range Goal Priority:  Medium Start Date:  07/17/20                          Expected End Date: 07/17/21                     Follow Up Date: 06/13/21   - tell my story and reason for my visit - make a list of questions - ask questions - repeat what I heard to make sure I understand - bring a list of my medicines to the visit - speak up when I don't understand  -Encouraged patient to continue to have his daughter assist and discussed for the patient to contact  nurse for questions regarding his health a wellness.    Why is this important?   The best way to learn about your health and care is by talking to the doctor and nurse.  They will answer your questions and give you information in the way that you like best.    Notes: Patient states that he does not read at a high level. Nurse encourages patient to ask questions frequently, to bring a list of questions and his medications to his provider appointments, and/or contact this nurse for assistance as needed.  Updated: 08/21/20  Updated  11/15/20: Encouraged patient to continue to have his daughter assist and discussed for the patient to contact nurse for questions regarding his health a wellness.         Marland Kitchen [facility]) Track and Manage My Symptoms       Timeframe:  Long-Range Goal Priority:  High Start Date: 07/17/20                            Expected End Date: 07/17/21                     Follow Up Date:  06/13/21   - develop a rescue plan - eliminate symptom triggers at home - follow rescue plan if symptoms flare-up - keep follow-up appointments    Why is this important?   Tracking your symptoms and other information about your health helps your doctor plan your care.  Write down the symptoms, the time of day, what you were doing and what medicine you are taking.  You will soon learn how to manage your symptoms.     Notes: Patient monitors his symptoms closely and follows his rescue plan. He manages his trach without difficulty. Updated: 08/21/20  Updated 11/15/20: Patient reports changing his medical care to [facility]. He is working with the [place] to get a speech valve for his trach that will work better. Patient reports that his breathing and COPD are currently under control.  02/13/21: Patient reports being at baseline with his COPD. He continues to follow his rescue action plan, uses NS to moisten secretions when he suctions his trach, and uses his rescue nebulizer as needed.    Marland Kitchen [facility]) Track and Manage My  Triggers       Timeframe:  Long-Range Goal Priority:  High Start Date: 07/17/20                           Expected End Date: 07/17/21                     Follow-up Date: 03/13/21   - avoid second hand smoke - identify and remove indoor air pollutants - limit outdoor activity during cold weather - listen for public air quality announcements every day    Why is this important?   Triggers are activities or things, like tobacco smoke or cold weather, that make your COPD (chronic obstructive pulmonary disease) flare-up.  Knowing these triggers helps you plan how to stay away from them.  When you cannot remove them, you can learn how to manage them.     Notes: Patient states his main trigger currently is cold weather. Patient states he does well if he limits his time outside.  Updated: 08/21/20  Updated 11/15/20: Patient states he is currently doing well and the recent weather changes have not effected his breathing.   02/13/21: Patient reports being at baseline with his COPD. He continues to follow his rescue action plan, uses NS to moisten secretions when he suctions his trach, and uses his rescue nebulizer as needed.      Plan:  RN Health Coach will send PCP today's assessment note and will call patient within the month of September. Follow-up:  Patient agrees to Care Plan and Follow-up.  Emelia Loron RN, BSN [place] [phone] [email or web address]

## 2021-02-14 DIAGNOSIS — J449 Chronic obstructive pulmonary disease, unspecified: Secondary | ICD-10-CM | POA: Diagnosis not present

## 2021-02-14 DIAGNOSIS — J4530 Mild persistent asthma, uncomplicated: Secondary | ICD-10-CM | POA: Diagnosis not present

## 2021-02-14 DIAGNOSIS — J45909 Unspecified asthma, uncomplicated: Secondary | ICD-10-CM | POA: Diagnosis not present

## 2021-02-14 DIAGNOSIS — E785 Hyperlipidemia, unspecified: Secondary | ICD-10-CM | POA: Diagnosis not present

## 2021-02-14 DIAGNOSIS — K219 Gastro-esophageal reflux disease without esophagitis: Secondary | ICD-10-CM | POA: Diagnosis not present

## 2021-02-14 DIAGNOSIS — I10 Essential (primary) hypertension: Secondary | ICD-10-CM | POA: Diagnosis not present

## 2021-02-17 ENCOUNTER — Ambulatory Visit: Payer: Medicare Other | Admitting: Podiatry

## 2021-02-25 DIAGNOSIS — Z930 Tracheostomy status: Secondary | ICD-10-CM | POA: Diagnosis not present

## 2021-03-03 IMAGING — DX DG CHEST 1V PORT
1 series · 1 of 1 positions shown · non-contrast
Comparison: CT chest 02/10/2019, chest radiograph 02/10/2019,
12/13/2018

CLINICAL DATA: Shortness of breath and wheezing. Patient has a
trach.

EXAM:
PORTABLE CHEST 1 VIEW

[chest]
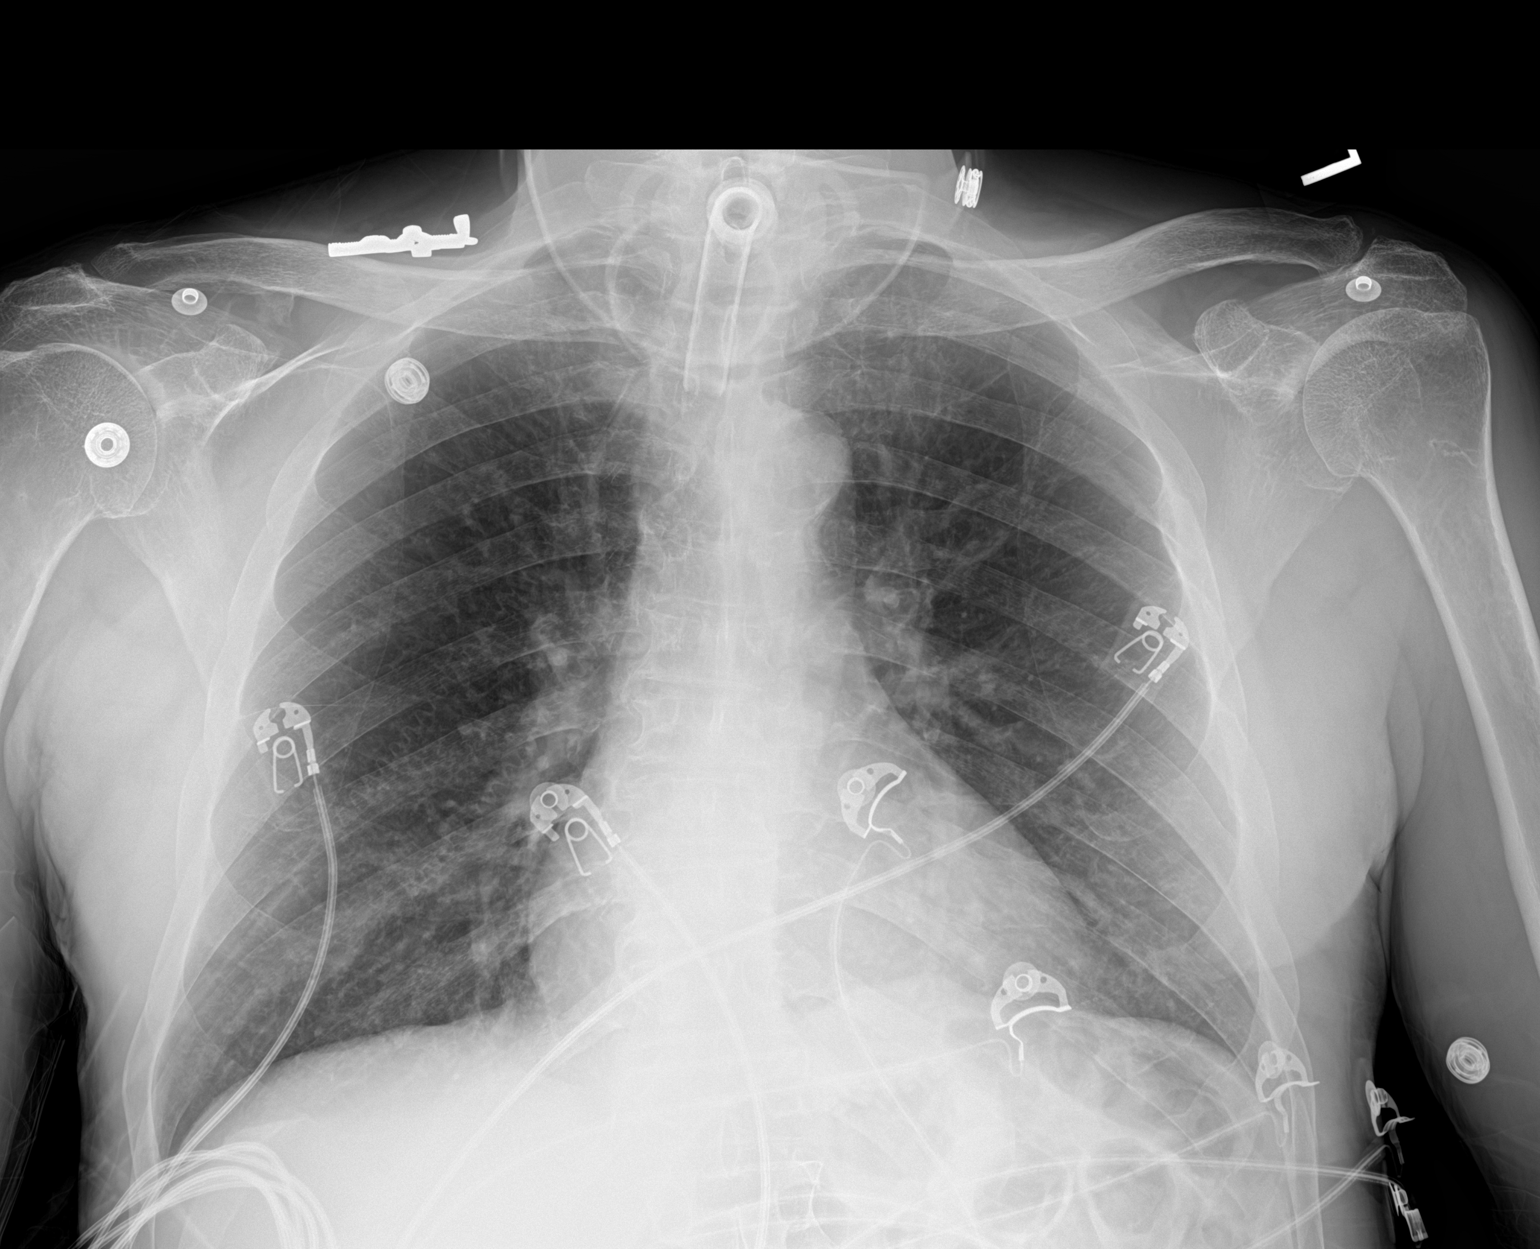

[1 of 1 positions shown; findings below may reference images not displayed]

FINDINGS: Stable cardiomediastinal contours with tracheostomy in place. No
pneumothorax or large pleural effusion. There are reticular
opacities in the bilateral left greater than right lower lungs
similar in appearance to the radiograph from 02/10/2019. Visualized
portions of the skeleton are unremarkable.
IMPRESSION: Reticular opacities in the bilateral left greater than right lower
lungs similar in appearance to the prior radiograph from January 2019 is
suspicious for recurrent aspiration/infection. Some of this may
represent underlying scarring or atelectasis.

## 2021-03-04 DIAGNOSIS — Z430 Encounter for attention to tracheostomy: Secondary | ICD-10-CM | POA: Diagnosis not present

## 2021-03-05 IMAGING — DX DG CHEST 1V PORT
1 series · 1 of 1 positions shown · non-contrast
Comparison: 05/20/2019

CLINICAL DATA: Shortness of breath.  COPD.

EXAM:
PORTABLE CHEST 1 VIEW

[chest]
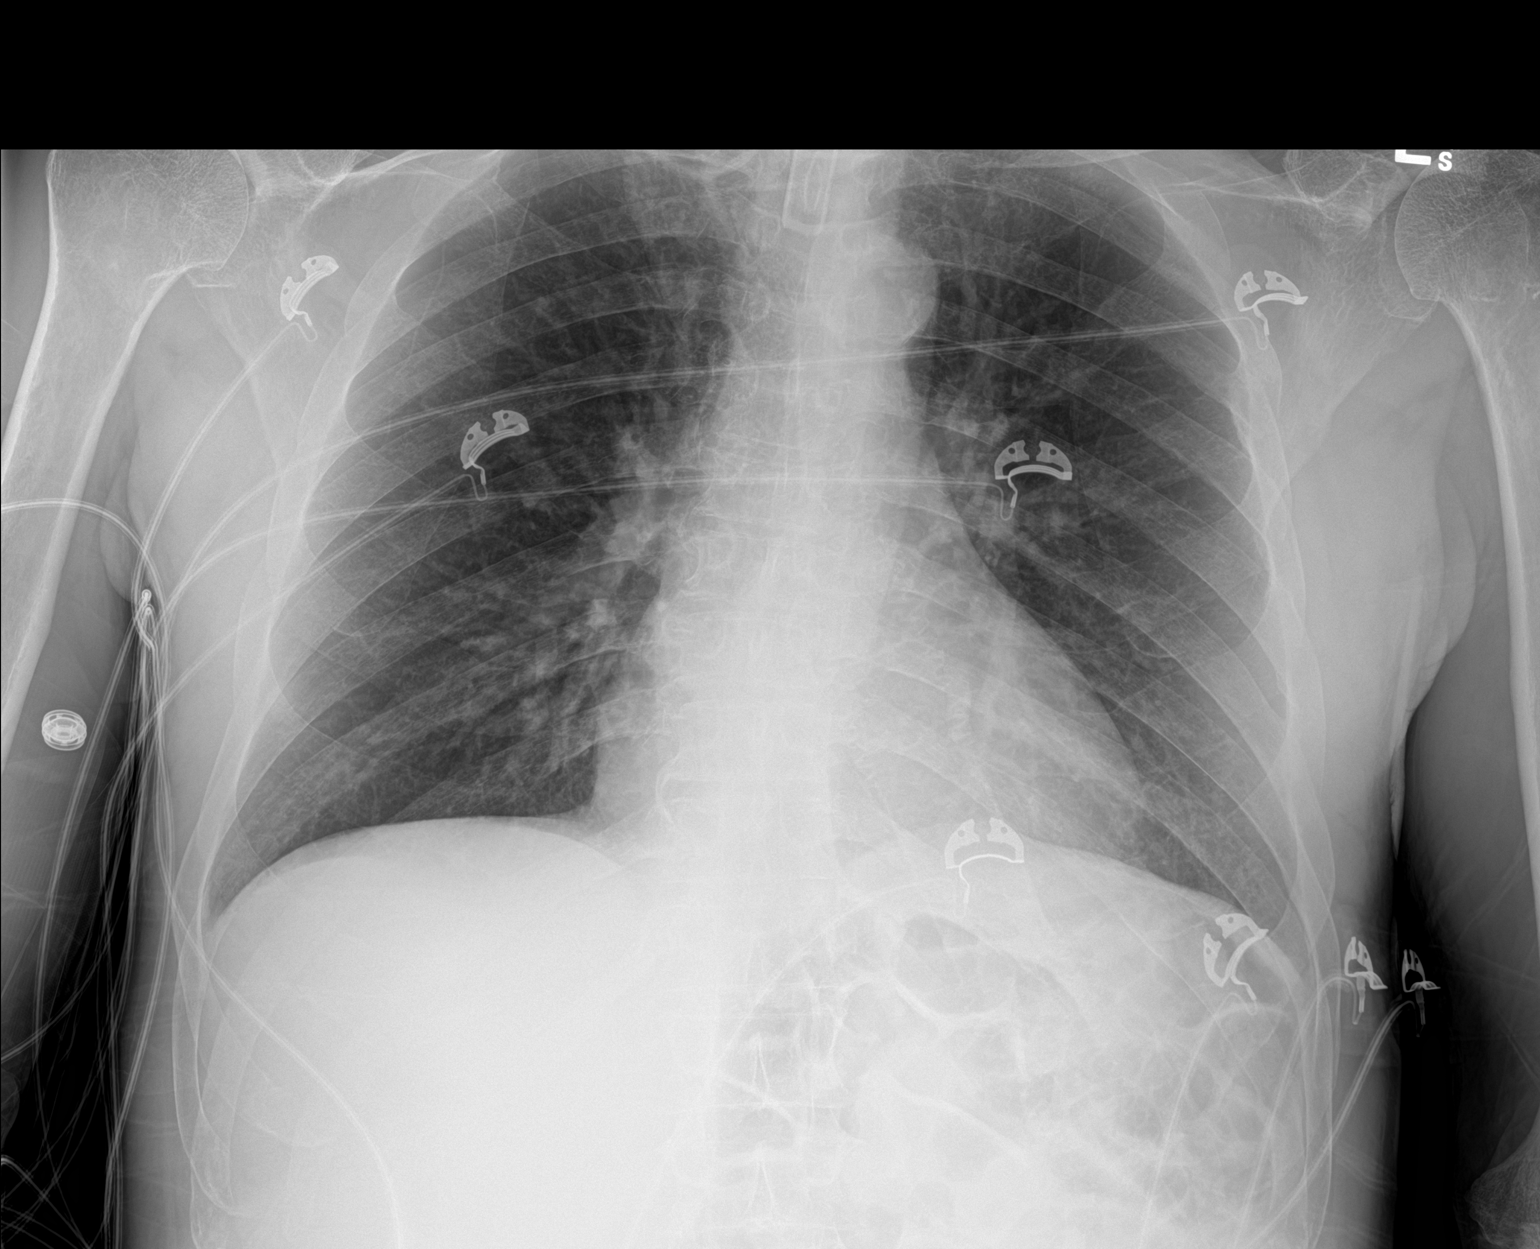

[1 of 1 positions shown; findings below may reference images not displayed]

FINDINGS: Tracheostomy tube appears to be well positioned. Lungs are clear
without airspace disease or pulmonary edema. Heart and mediastinum
are within normal limits. Atherosclerotic calcifications at the
aortic arch. Negative for a pneumothorax. Bone structures are
intact.
IMPRESSION: 1. No acute cardiopulmonary disease.
2. Tracheostomy tube.

## 2021-03-11 IMAGING — DX DG CHEST 1V PORT
1 series · 1 of 1 positions shown · non-contrast
Comparison: Chest radiograph 05/22/2019

CLINICAL DATA: Patient with cough.

EXAM:
PORTABLE CHEST 1 VIEW

[chest ap]
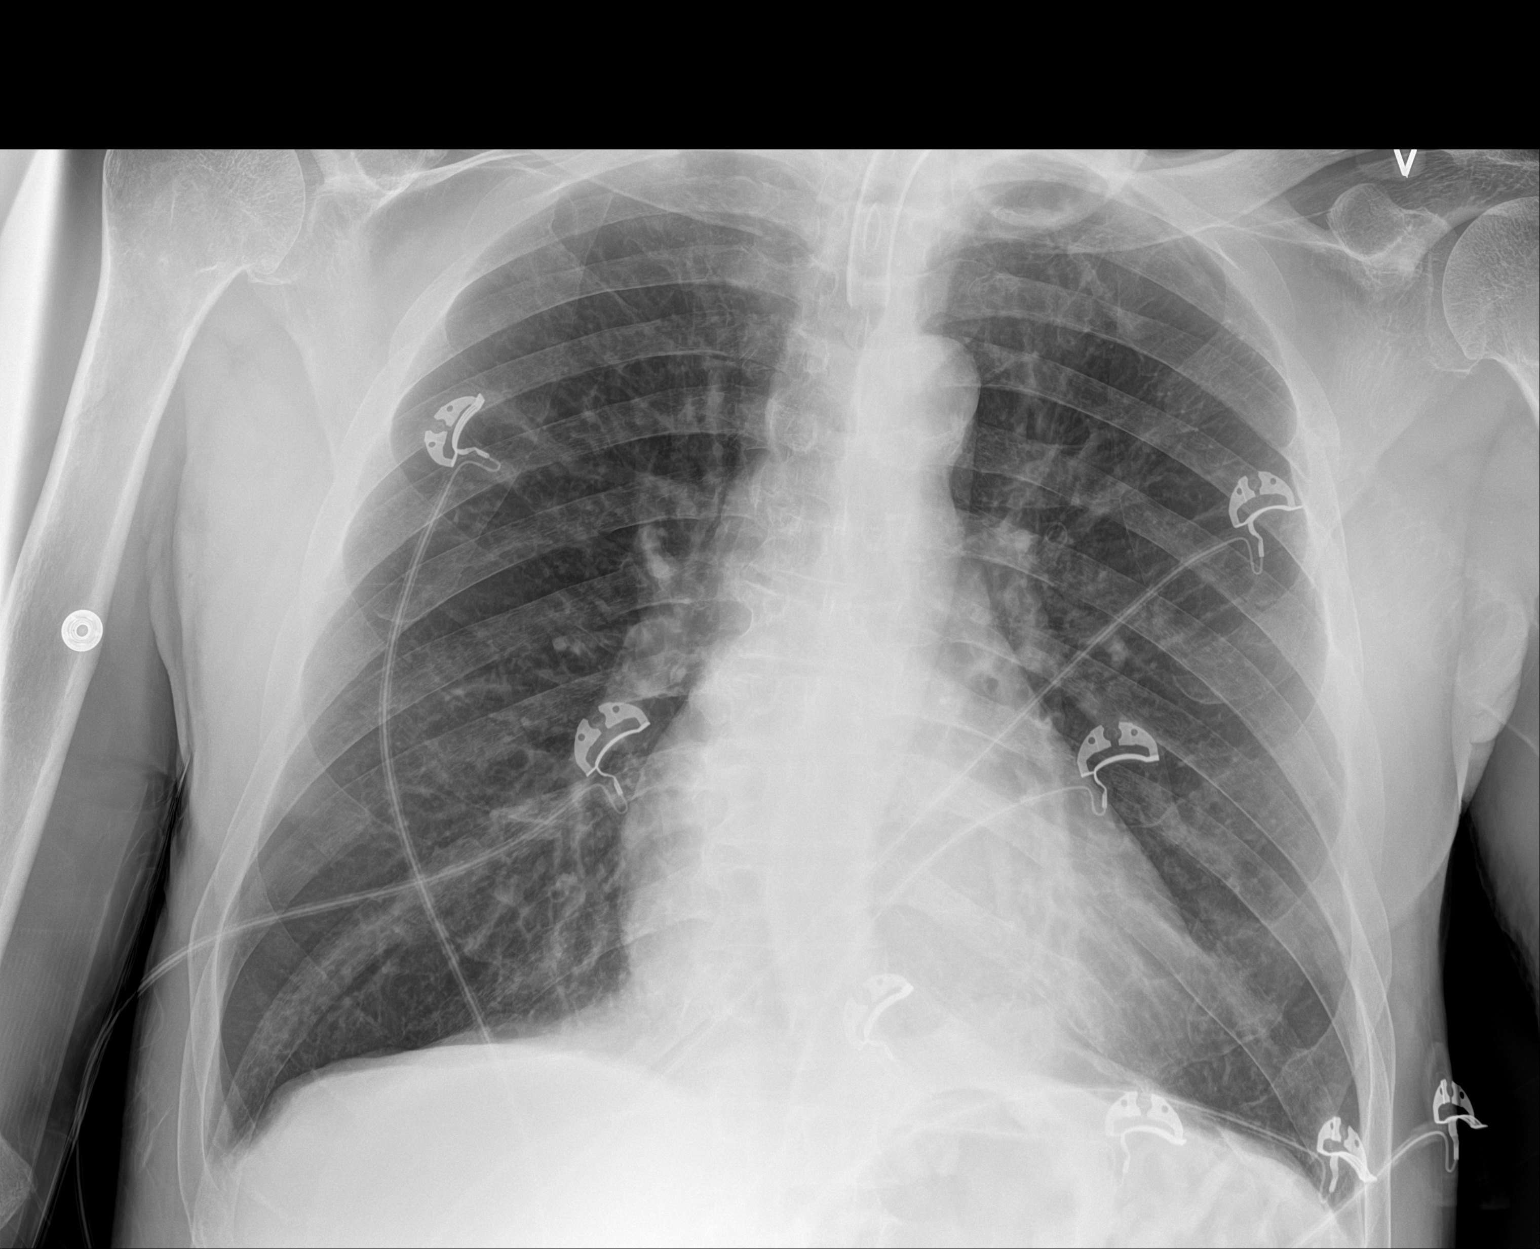

[1 of 1 positions shown; findings below may reference images not displayed]

FINDINGS: Monitoring leads overlie the patient. Tracheostomy tube terminates
in the mid trachea. Stable cardiomegaly. Interval development of
patchy consolidation within the left lower lung. No pleural effusion
or pneumothorax. Thoracic spine degenerative changes.
IMPRESSION: Left lower lung consolidation may represent pneumonia in the
appropriate clinical setting. Followup PA and lateral chest X-ray is
recommended in 3-4 weeks following trial of antibiotic therapy to
ensure resolution and exclude underlying malignancy.

## 2021-03-20 ENCOUNTER — Ambulatory Visit (INDEPENDENT_AMBULATORY_CARE_PROVIDER_SITE_OTHER): Payer: Medicare Other | Admitting: Podiatrist

## 2021-03-20 ENCOUNTER — Other Ambulatory Visit: Payer: Self-pay

## 2021-03-20 ENCOUNTER — Encounter: Payer: Self-pay | Admitting: Podiatrist

## 2021-03-20 DIAGNOSIS — F528 Other sexual dysfunction not due to a substance or known physiological condition: Secondary | ICD-10-CM | POA: Insufficient documentation

## 2021-03-20 DIAGNOSIS — Z659 Problem related to unspecified psychosocial circumstances: Secondary | ICD-10-CM | POA: Insufficient documentation

## 2021-03-20 DIAGNOSIS — M7750 Other enthesopathy of unspecified foot: Secondary | ICD-10-CM | POA: Insufficient documentation

## 2021-03-20 DIAGNOSIS — Z5189 Encounter for other specified aftercare: Secondary | ICD-10-CM | POA: Insufficient documentation

## 2021-03-20 DIAGNOSIS — M21539 Acquired clawfoot, unspecified foot: Secondary | ICD-10-CM | POA: Insufficient documentation

## 2021-03-20 DIAGNOSIS — M21532 Acquired clawfoot, left foot: Secondary | ICD-10-CM | POA: Insufficient documentation

## 2021-03-20 DIAGNOSIS — L84 Corns and callosities: Secondary | ICD-10-CM | POA: Diagnosis not present

## 2021-03-20 DIAGNOSIS — M79672 Pain in left foot: Secondary | ICD-10-CM | POA: Insufficient documentation

## 2021-03-20 DIAGNOSIS — D126 Benign neoplasm of colon, unspecified: Secondary | ICD-10-CM | POA: Insufficient documentation

## 2021-03-20 DIAGNOSIS — M25579 Pain in unspecified ankle and joints of unspecified foot: Secondary | ICD-10-CM | POA: Insufficient documentation

## 2021-03-20 DIAGNOSIS — R3989 Other symptoms and signs involving the genitourinary system: Secondary | ICD-10-CM | POA: Insufficient documentation

## 2021-03-20 DIAGNOSIS — M79609 Pain in unspecified limb: Secondary | ICD-10-CM | POA: Insufficient documentation

## 2021-03-20 DIAGNOSIS — H04123 Dry eye syndrome of bilateral lacrimal glands: Secondary | ICD-10-CM | POA: Insufficient documentation

## 2021-03-20 NOTE — Patient Instructions (Signed)
Corns and Calluses Corns are small areas of thickened skin that form on the top, sides, or tip of a toe. Corns have a cone-shaped core with a point that can press on a nerve below. This causes pain. Calluses are areas of thickened skin that can form anywhere on the body, including the hands, fingers, palms, soles of the feet, and heels. Calluses are usually larger than corns. What are the causes? Corns and calluses are caused by rubbing (friction) or pressure, such as from shoes that are too tight or do not fit properly. What increases the risk? Corns are more likely to develop in people who have misshapen toes (toe deformities), such as hammer toes. Calluses can form with friction to any area of the skin. They are more likely to develop in people who: Work with their hands. Wear shoes that fit poorly, are too tight, or are high-heeled. Have toe deformities. What are the signs or symptoms? Symptoms of a corn or callus include: A hard growth on the skin. Pain or tenderness under the skin. Redness and swelling. Increased discomfort while wearing tight-fitting shoes, if your feet are affected. If a corn or callus becomes infected, symptoms may include: Redness and swelling that gets worse. Pain. Fluid, blood, or pus draining from the corn or callus. How is this diagnosed? Corns and calluses may be diagnosed based on your symptoms, your medical history, and a physical exam. How is this treated? Treatment for corns and calluses may include: Removing the cause of the friction or pressure. This may involve: Changing your shoes. Wearing shoe inserts (orthotics) or other protective layers in your shoes, such as a corn pad. Wearing gloves. Applying medicine to the skin (topical medicine) to help soften skin in the hardened, thickened areas. Removing layers of dead skin with a file to reduce the size of the corn or callus. Removing the corn or callus with a scalpel or laser. Taking antibiotic  medicines, if your corn or callus is infected. Having surgery, if a toe deformity is the cause. Follow these instructions at home:  Take over-the-counter and prescription medicines only as told by your health care provider. If you were prescribed an antibiotic medicine, take it as told by your health care provider. Do not stop taking it even if your condition improves. Wear shoes that fit well. Avoid wearing high-heeled shoes and shoes that are too tight or too loose. Wear any padding, protective layers, gloves, or orthotics as told by your health care provider. Soak your hands or feet. Then use a file or pumice stone to soften your corn or callus. Do this as told by your health care provider. Check your corn or callus every day for signs of infection. Contact a health care provider if: Your symptoms do not improve with treatment. You have redness or swelling that gets worse. Your corn or callus becomes painful. You have fluid, blood, or pus coming from your corn or callus. You have new symptoms. Get help right away if: You develop severe pain with redness. Summary Corns are small areas of thickened skin that form on the top, sides, or tip of a toe. These can be painful. Calluses are areas of thickened skin that can form anywhere on the body, including the hands, fingers, palms, and soles of the feet. Calluses are usually larger than corns. Corns and calluses are caused by rubbing (friction) or pressure, such as from shoes that are too tight or do not fit properly. Treatment may include wearing padding, protective   layers, gloves, or orthotics as told by your health care provider. This information is not intended to replace advice given to you by your health care provider. Make sure you discuss any questions you have with your health care provider. Document Revised: 12/28/2019 Document Reviewed: 12/28/2019 Elsevier Patient Education  2022 Elsevier Inc.  
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
## 2021-03-27 DIAGNOSIS — Z930 Tracheostomy status: Secondary | ICD-10-CM | POA: Diagnosis not present

## 2021-04-01 DIAGNOSIS — K219 Gastro-esophageal reflux disease without esophagitis: Secondary | ICD-10-CM | POA: Diagnosis not present

## 2021-04-01 DIAGNOSIS — E785 Hyperlipidemia, unspecified: Secondary | ICD-10-CM | POA: Diagnosis not present

## 2021-04-01 DIAGNOSIS — I10 Essential (primary) hypertension: Secondary | ICD-10-CM | POA: Diagnosis not present

## 2021-04-01 DIAGNOSIS — J449 Chronic obstructive pulmonary disease, unspecified: Secondary | ICD-10-CM | POA: Diagnosis not present

## 2021-04-01 DIAGNOSIS — J45909 Unspecified asthma, uncomplicated: Secondary | ICD-10-CM | POA: Diagnosis not present

## 2021-04-01 DIAGNOSIS — J4530 Mild persistent asthma, uncomplicated: Secondary | ICD-10-CM | POA: Diagnosis not present

## 2021-04-03 DIAGNOSIS — J3802 Paralysis of vocal cords and larynx, bilateral: Secondary | ICD-10-CM | POA: Diagnosis not present

## 2021-04-03 DIAGNOSIS — J324 Chronic pansinusitis: Secondary | ICD-10-CM | POA: Diagnosis not present

## 2021-04-03 DIAGNOSIS — Z930 Tracheostomy status: Secondary | ICD-10-CM | POA: Diagnosis not present

## 2021-04-28 DIAGNOSIS — Z430 Encounter for attention to tracheostomy: Secondary | ICD-10-CM | POA: Diagnosis not present

## 2021-05-20 ENCOUNTER — Ambulatory Visit: Payer: Medicare Other | Admitting: Internal Medicine

## 2021-05-22 ENCOUNTER — Ambulatory Visit: Payer: No Typology Code available for payment source | Admitting: Internal Medicine

## 2021-05-23 ENCOUNTER — Other Ambulatory Visit: Payer: Self-pay

## 2021-05-23 ENCOUNTER — Ambulatory Visit (INDEPENDENT_AMBULATORY_CARE_PROVIDER_SITE_OTHER): Payer: Medicare Other | Admitting: Adult Health

## 2021-05-23 ENCOUNTER — Encounter: Payer: Self-pay | Admitting: Adult Health

## 2021-05-23 VITALS — BP 106/68 | HR 83 | Temp 98.3°F | Ht 65.5 in | Wt 163.8 lb

## 2021-05-23 DIAGNOSIS — Z23 Encounter for immunization: Secondary | ICD-10-CM

## 2021-05-23 DIAGNOSIS — J449 Chronic obstructive pulmonary disease, unspecified: Secondary | ICD-10-CM

## 2021-05-23 DIAGNOSIS — Z930 Tracheostomy status: Secondary | ICD-10-CM

## 2021-05-23 DIAGNOSIS — J4530 Mild persistent asthma, uncomplicated: Secondary | ICD-10-CM | POA: Diagnosis not present

## 2021-05-23 NOTE — Assessment & Plan Note (Deleted)
Chronic airflow obstruction.  Patient is a never cigarette smoker but smoked marijuana in the past.  He is doing very well on Symbicort.  Patient is continue on current regimen.  Plan  Patient Instructions  Continue on Symbicort 2 puffs Twice daily .  Continue with Trach care .  Activity as tolerated.  Continue on Singulair daily  Flu shot today  Follow up with Dr. Melvyn Novas  in 6 months and As needed
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
## 2021-05-23 NOTE — Patient Instructions (Addendum)
Continue on Symbicort 2 puffs Twice daily .  Continue with Trach care .  Activity as tolerated.  Continue on Singulair daily  Flu shot today  Follow up with Dr. Melvyn Novas  in 6 months and As needed

## 2021-05-23 NOTE — Assessment & Plan Note (Signed)
Chronic obstructive asthma.  Patient is a never cigarette smoker but smoked marijuana in the past.  He is doing very well on Symbicort.  Patient is continue on current regimen.  Plan  Patient Instructions  Continue on Symbicort 2 puffs Twice daily .  Continue with Trach care .  Activity as tolerated.  Continue on Singulair daily  Flu shot today  Follow up with Dr. Melvyn Novas  in 6 months and As needed

## 2021-05-23 NOTE — Assessment & Plan Note (Signed)
Patient is doing very well with chronic trach.  Continue with trach care and trach changes at the [facility] every 3 months as planned.  Continue with daily trach care

## 2021-05-29 DIAGNOSIS — Z430 Encounter for attention to tracheostomy: Secondary | ICD-10-CM | POA: Diagnosis not present

## 2021-05-29 DIAGNOSIS — Z930 Tracheostomy status: Secondary | ICD-10-CM | POA: Diagnosis not present

## 2021-06-02 ENCOUNTER — Other Ambulatory Visit: Payer: Self-pay | Admitting: *Deleted

## 2021-06-02 NOTE — Patient Outreach (Signed)
[name]) Care Management  [place]  06/02/2021   [name] [name] 11/04/1950 [id number]  Subjective: Successful telephone outreach call to patient. HIPAA identifiers obtained. Patient states he dong well at this time. Nurse discussed with patient his health goals and his health and wellness needs which were documented in the Epic system. Patient did not have any further questions or concerns today and did confirm that he has this nurse's contact number to call her if needed.   Encounter Medications:  Outpatient Encounter Medications as of 06/02/2021  Medication Sig Note   acetaminophen 325 MG tablet Take 2 tablets (650 mg total) by mouth every 6 (six) hours as needed. (Patient taking differently: Take 650 mg by mouth every 6 (six) hours as needed for pain.)    albuterol (PROVENTIL) (2.5 MG/3ML) 0.083% nebulizer solution Take 3 mLs (2.5 mg total) by nebulization every 4 (four) hours as needed for wheezing or shortness of breath.    albuterol (VENTOLIN HFA) 108 (90 Base) MCG/ACT inhaler INHALE 2 PUFFS INTO THE LUNGS EVERY 6 HOURS AS NEEDED (Patient taking differently: Inhale 2 puffs into the lungs every 6 (six) hours as needed for wheezing or shortness of breath.)    carboxymethylcellulose (REFRESH PLUS) 0.5 % SOLN Apply to eye.    Carboxymethylcellulose Sod PF (REFRESH PLUS) 0.5 % SOLN Place 1 drop into both eyes 2 (two) times daily as needed (dry eyes).    cetirizine (ZYRTEC) 10 MG tablet Take 10 mg by mouth daily as needed for allergies.     cyclobenzaprine (FLEXERIL) 10 MG tablet Take by mouth.    docusate sodium (COLACE) 100 MG capsule Take 1 capsule (100 mg total) by mouth 2 (two) times daily as needed for mild constipation. 11/15/2020: Not needed   finasteride (PROSCAR) 5 MG tablet Take 5 mg by mouth daily.    fluticasone (FLONASE) 50 MCG/ACT nasal spray Place 2 sprays into both nostrils daily as needed for allergies.     guaiFENesin (MUCINEX) 600 MG 12 hr  tablet Take 1 tablet (600 mg total) by mouth 2 (two) times daily as needed for to loosen phlegm. (Patient taking differently: Take 600 mg by mouth daily.)    hydrochlorothiazide (HYDRODIURIL) 25 MG tablet Take 1 tablet (25 mg total) by mouth daily.    ibuprofen (ADVIL) 800 MG tablet Take 1 tablet (800 mg total) by mouth every 8 (eight) hours as needed. 11/15/2020: completed   losartan (COZAAR) 25 MG tablet Take 25 mg by mouth at bedtime.    losartan (COZAAR) 50 MG tablet Take 50 mg by mouth daily.     montelukast (SINGULAIR) 10 MG tablet Take 1 tablet by mouth daily.    Multiple Vitamin (MULTIVITAMIN) tablet Take 1 tablet by mouth daily.    mupirocin ointment (BACTROBAN) 2 % Apply topically.    pantoprazole (PROTONIX) 40 MG tablet Take 1 tablet by mouth daily.    sildenafil (VIAGRA) 100 MG tablet Take by mouth.    sodium chloride (OCEAN) 0.65 % nasal spray Place into the nose.    sodium chloride 0.9 % nebulizer solution Take 3-4 mLs by nebulization daily as needed. Use 3-4 drops into Trach to loosen phlegm suction secretions immediatly after.  11/15/2020: Reports he has not been able to get the bullets and is using NS bottle to measure and put down trach   sodium chloride 0.9 % nebulizer solution Inhale into the lungs.    SYMBICORT 160-4.5 MCG/ACT inhaler SMARTSIG:2 Puff(s) By Mouth Twice Daily  terazosin (HYTRIN) 5 MG capsule Take 5 mg by mouth at bedtime.     terazosin (HYTRIN) 5 MG capsule Take 1 capsule by mouth at bedtime.    mometasone-formoterol (DULERA) 100-5 MCG/ACT AERO Inhale 2 puffs into the lungs 2 (two) times daily. (Patient not taking: Reported on 06/02/2021) 06/02/2021: completed   No facility-administered encounter medications on file as of 06/02/2021.    Functional Status:  No flowsheet data found.  Fall/Depression Screening: Fall Risk  06/02/2021 11/15/2020 11/15/2020  Falls in the past year? 0 0 0  Number falls in past yr: 0 0 0  Injury with Fall? 0 0 0  Follow up Falls  prevention discussed;Education provided;Falls evaluation completed Falls prevention discussed;Education provided;Falls evaluation completed Falls prevention discussed;Education provided;Falls evaluation completed   PHQ 2/9 Scores 06/02/2021 11/15/2020 08/22/2020 07/17/2020 04/02/2020 12/29/2019 10/26/2019  PHQ - 2 Score 0 0 0 0 0 0 0    Assessment:   Care Plan There are no care plans that you recently modified to display for this patient.    Goals Addressed             This Visit's Progress    [facility]) Patient will  verbalized working with pharmacy to inquire about medication assistance for NS bullets within the next 30 days       Timeframe:  Short-Term Goal Priority:  High Start Date:  06/02/21                           Expected End Date: 09/12/21 Follow Up Date: 09/12/21                     Notes: Patient states that he is having difficulty receiving his NS bullets from the [place]. The shipment is inconsistent and he relies on the NS bullets to keep his secretions moistened when he suctions his trach. Patient reports that [facility] was supplying the NS bullets previously and he asked if they could assist him with ordering the NS bullets. Nurse will refer patient to pharmacy to inquire about NS bullet assistance.      [facility]) Track and Manage My Symptoms       Timeframe:  Long-Range Goal Priority:  High Start Date: 07/17/20                            Expected End Date: 07/17/21                     Follow Up Date:  09/12/21   - develop a rescue plan - eliminate symptom triggers at home - follow rescue plan if symptoms flare-up - keep follow-up appointments    Why is this important?   Tracking your symptoms and other information about your health helps your doctor plan your care.  Write down the symptoms, the time of day, what you were doing and what medicine you are taking.  You will soon learn how to manage your symptoms.     Notes: Patient monitors his symptoms closely and follows his  rescue plan. He manages his trach without difficulty. Updated: 08/21/20  Updated 11/15/20: Patient reports changing his medical care to [facility]. He is working with the [place] to get a speech valve for his trach that will work better. Patient reports that his breathing and COPD are currently under control.  02/13/21: Patient reports being at baseline with his COPD. He continues to  follow his rescue action plan, uses NS to moisten secretions when he suctions his trach, and uses his rescue nebulizer as needed.  06/02/21: Patient states he continues to follow his rescue action plan. He reports that his COPD is well controlled at this time. Patient states he continues to suction his trach using NS bullets to moisten his secretions. He uses his rescue medication approximately 4-5 weekly as needed.      [facility]) Track and Manage My Triggers       Timeframe:  Long-Range Goal Priority:  High Start Date: 07/17/20                           Expected End Date: 07/17/21                     Follow-up Date: 09/12/21   - avoid second hand smoke - identify and remove indoor air pollutants - limit outdoor activity during cold weather - listen for public air quality announcements every day    Why is this important?   Triggers are activities or things, like tobacco smoke or cold weather, that make your COPD (chronic obstructive pulmonary disease) flare-up.  Knowing these triggers helps you plan how to stay away from them.  When you cannot remove them, you can learn how to manage them.     Notes: Patient states his main trigger currently is cold weather. Patient states he does well if he limits his time outside.  Updated: 08/21/20  Updated 11/15/20: Patient states he is currently doing well and the recent weather changes have not effected his breathing.   02/13/21: Patient reports being at baseline with his COPD. He continues to follow his rescue action plan, uses NS to moisten secretions when he suctions his trach, and uses his  rescue nebulizer as needed.  06/02/21: Patient states that his home environment is free from triggers. Patient states that currently his COPD is well controlled.     COMPLETED: Patient will not have any ED or hospital admission within the next 90 days       [place] (see longtitudinal plan of care for additional care plan information)  Current Barriers:  Knowledge deficits related to basic COPD self care/management   Case Manager Clinical Goal(s): Over the next 90 days patient will report using inhalers as prescribed including rinsing mouth after use Over the next 90 days patient will report utilizing pursed lip breathing for shortness of breath Over the next 90 days, patient will be able to verbalize understanding of COPD action plan and when to seek appropriate levels of medical care Over the next 90 days, patient will engage in lite exercise as tolerated to build/regain stamina and strength and reduce shortness of breath through activity tolerance Over the next 90 days, patient will verbalize basic understanding of COPD disease process and self care activities Over the next 90 days, patient will not be hospitalized for COPD exacerbation Patient reports that he is following COPD zones and action plans .   Interventions:  Provided patient with basic written and verbal COPD education on self care/management/and exacerbation prevention  Provided patient with COPD action plan and reinforced importance of daily self assessment Provided written and verbal instructions on pursed lip breathing and utilized returned demonstration as teach back Advised patient to self assesses COPD action plan zone and make appointment with provider if in the yellow zone for 48 hours without improvement. Provided patient with education  about the role of exercise in the management of COPD Advised patient to engage in light exercise as tolerated 3-5 days a week Provided education about and advised patient to  utilize infection prevention strategies to reduce risk of respiratory infection   Patient Self Care Activities:  Takes medications as prescribed including inhalers Self assesses COPD action plan zone and makes appointment with provider if in the yellow zone for 48 hours without improvement. Engages in light exercise 3-5 days a week Utilizes infection prevention strategies to reduce risk of respiratory infection  Patient manages his trach without difficulty.    Patient has received this years annual flu shot and plans to get the covid booster during his upcoming [facility] appointment.   Timeframe:  Long-Range Goal Priority:  High Start Date:  04/02/20                           Expected End Date: 04/02/21 Follow Up Date: 03/13/21                      Please see past updates related to this goal by clicking on the "Past Updates" button in the selected goal    Updated: 08/21/20  Updated 11/15/20: Patient reports changing his medical care to [facility]. He is working with the [place] to get a speech valve for his trach that will work better. Patient reports that his breathing and COPD are currently under control.  Resolved 2/54/27 due to duplicate goals       Plan: [place] will send a quarterly update to PCP, will send referral to pharmacy for medication assistance, and will call patient within the month of December. Follow-up: Patient agrees to Care Plan and Follow-up.  Emelia Loron RN, BSN [place] [phone] Channel [email or web address]

## 2021-06-02 NOTE — Patient Instructions (Signed)
Goals Addressed             This Visit's Progress    [facility]) Patient will  verbalized working with pharmacy to inquire about medication assistance for NS bullets within the next 30 days       Timeframe:  Short-Term Goal Priority:  High Start Date:  06/02/21                           Expected End Date: 09/12/21 Follow Up Date: 09/12/21                     Notes: Patient states that he is having difficulty receiving his NS bullets from the [place]. The shipment is inconsistent and he relies on the NS bullets to keep his secretions moistened when he suctions his trach. Patient reports that [facility] was supplying the NS bullets previously and he asked if they could assist him with ordering the NS bullets. Nurse will refer patient to pharmacy to inquire about NS bullet assistance.      [facility]) Track and Manage My Symptoms       Timeframe:  Long-Range Goal Priority:  High Start Date: 07/17/20                            Expected End Date: 07/17/21                     Follow Up Date:  09/12/21   - develop a rescue plan - eliminate symptom triggers at home - follow rescue plan if symptoms flare-up - keep follow-up appointments    Why is this important?   Tracking your symptoms and other information about your health helps your doctor plan your care.  Write down the symptoms, the time of day, what you were doing and what medicine you are taking.  You will soon learn how to manage your symptoms.     Notes: Patient monitors his symptoms closely and follows his rescue plan. He manages his trach without difficulty. Updated: 08/21/20  Updated 11/15/20: Patient reports changing his medical care to [facility]. He is working with the [place] to get a speech valve for his trach that will work better. Patient reports that his breathing and COPD are currently under control.  02/13/21: Patient reports being at baseline with his COPD. He continues to follow his rescue action plan, uses NS to moisten secretions when he suctions  his trach, and uses his rescue nebulizer as needed.  06/02/21: Patient states he continues to follow his rescue action plan. He reports that his COPD is well controlled at this time. Patient states he continues to suction his trach using NS bullets to moisten his secretions. He uses his rescue medication approximately 4-5 weekly as needed.      [facility]) Track and Manage My Triggers       Timeframe:  Long-Range Goal Priority:  High Start Date: 07/17/20                           Expected End Date: 07/17/21                     Follow-up Date: 09/12/21   - avoid second hand smoke - identify and remove indoor air pollutants - limit outdoor activity during cold weather - listen for public air quality announcements every day  Why is this important?   Triggers are activities or things, like tobacco smoke or cold weather, that make your COPD (chronic obstructive pulmonary disease) flare-up.  Knowing these triggers helps you plan how to stay away from them.  When you cannot remove them, you can learn how to manage them.     Notes: Patient states his main trigger currently is cold weather. Patient states he does well if he limits his time outside.  Updated: 08/21/20  Updated 11/15/20: Patient states he is currently doing well and the recent weather changes have not effected his breathing.   02/13/21: Patient reports being at baseline with his COPD. He continues to follow his rescue action plan, uses NS to moisten secretions when he suctions his trach, and uses his rescue nebulizer as needed.  06/02/21: Patient states that his home environment is free from triggers. Patient states that currently his COPD is well controlled.     COMPLETED: Patient will not have any ED or hospital admission within the next 90 days       [place] (see longtitudinal plan of care for additional care plan information)  Current Barriers:  Knowledge deficits related to basic COPD self care/management   Case Manager  Clinical Goal(s): Over the next 90 days patient will report using inhalers as prescribed including rinsing mouth after use Over the next 90 days patient will report utilizing pursed lip breathing for shortness of breath Over the next 90 days, patient will be able to verbalize understanding of COPD action plan and when to seek appropriate levels of medical care Over the next 90 days, patient will engage in lite exercise as tolerated to build/regain stamina and strength and reduce shortness of breath through activity tolerance Over the next 90 days, patient will verbalize basic understanding of COPD disease process and self care activities Over the next 90 days, patient will not be hospitalized for COPD exacerbation Patient reports that he is following COPD zones and action plans .   Interventions:  Provided patient with basic written and verbal COPD education on self care/management/and exacerbation prevention  Provided patient with COPD action plan and reinforced importance of daily self assessment Provided written and verbal instructions on pursed lip breathing and utilized returned demonstration as teach back Advised patient to self assesses COPD action plan zone and make appointment with provider if in the yellow zone for 48 hours without improvement. Provided patient with education about the role of exercise in the management of COPD Advised patient to engage in light exercise as tolerated 3-5 days a week Provided education about and advised patient to utilize infection prevention strategies to reduce risk of respiratory infection   Patient Self Care Activities:  Takes medications as prescribed including inhalers Self assesses COPD action plan zone and makes appointment with provider if in the yellow zone for 48 hours without improvement. Engages in light exercise 3-5 days a week Utilizes infection prevention strategies to reduce risk of respiratory infection  Patient manages his trach  without difficulty.    Patient has received this years annual flu shot and plans to get the covid booster during his upcoming [place] appointment.   Timeframe:  Long-Range Goal Priority:  High Start Date:  04/02/20                           Expected End Date: 04/02/21 Follow Up Date: 03/13/21  Please see past updates related to this goal by clicking on the "Past Updates" button in the selected goal    Updated: 08/21/20  Updated 11/15/20: Patient reports changing his medical care to [facility]. He is working with the [facility] to get a speech valve for his trach that will work better. Patient reports that his breathing and COPD are currently under control.  Resolved [id number] due to duplicate goals

## 2021-06-02 NOTE — Patient Outreach (Signed)
[facility]) Care Management  06/02/2021  [name] [name] [birth year] [id number]  Referral for medication assistance from Emelia Loron, RN sent to [facility] Management Assistant [phone]

## 2021-06-04 DIAGNOSIS — E785 Hyperlipidemia, unspecified: Secondary | ICD-10-CM | POA: Diagnosis not present

## 2021-06-04 DIAGNOSIS — I10 Essential (primary) hypertension: Secondary | ICD-10-CM | POA: Diagnosis not present

## 2021-06-04 DIAGNOSIS — K219 Gastro-esophageal reflux disease without esophagitis: Secondary | ICD-10-CM | POA: Diagnosis not present

## 2021-06-04 DIAGNOSIS — J449 Chronic obstructive pulmonary disease, unspecified: Secondary | ICD-10-CM | POA: Diagnosis not present

## 2021-06-04 DIAGNOSIS — J45909 Unspecified asthma, uncomplicated: Secondary | ICD-10-CM | POA: Diagnosis not present

## 2021-06-04 DIAGNOSIS — J4530 Mild persistent asthma, uncomplicated: Secondary | ICD-10-CM | POA: Diagnosis not present

## 2021-06-18 DIAGNOSIS — I10 Essential (primary) hypertension: Secondary | ICD-10-CM | POA: Diagnosis not present

## 2021-06-18 DIAGNOSIS — J4530 Mild persistent asthma, uncomplicated: Secondary | ICD-10-CM | POA: Diagnosis not present

## 2021-06-18 DIAGNOSIS — K219 Gastro-esophageal reflux disease without esophagitis: Secondary | ICD-10-CM | POA: Diagnosis not present

## 2021-06-18 DIAGNOSIS — E785 Hyperlipidemia, unspecified: Secondary | ICD-10-CM | POA: Diagnosis not present

## 2021-06-18 DIAGNOSIS — J45909 Unspecified asthma, uncomplicated: Secondary | ICD-10-CM | POA: Diagnosis not present

## 2021-06-18 DIAGNOSIS — J449 Chronic obstructive pulmonary disease, unspecified: Secondary | ICD-10-CM | POA: Diagnosis not present

## 2021-06-24 IMAGING — DX DG CHEST 1V PORT
1 series · 1 of 1 positions shown · non-contrast
Comparison: Radiographs 05/28/2019 and 05/22/2019. CT 02/10/2019.

CLINICAL DATA: Shortness of breath. Tracheostomy patient. History
of COPD, hypertension and pneumonia.

EXAM:
PORTABLE CHEST 1 VIEW

[chest ap]
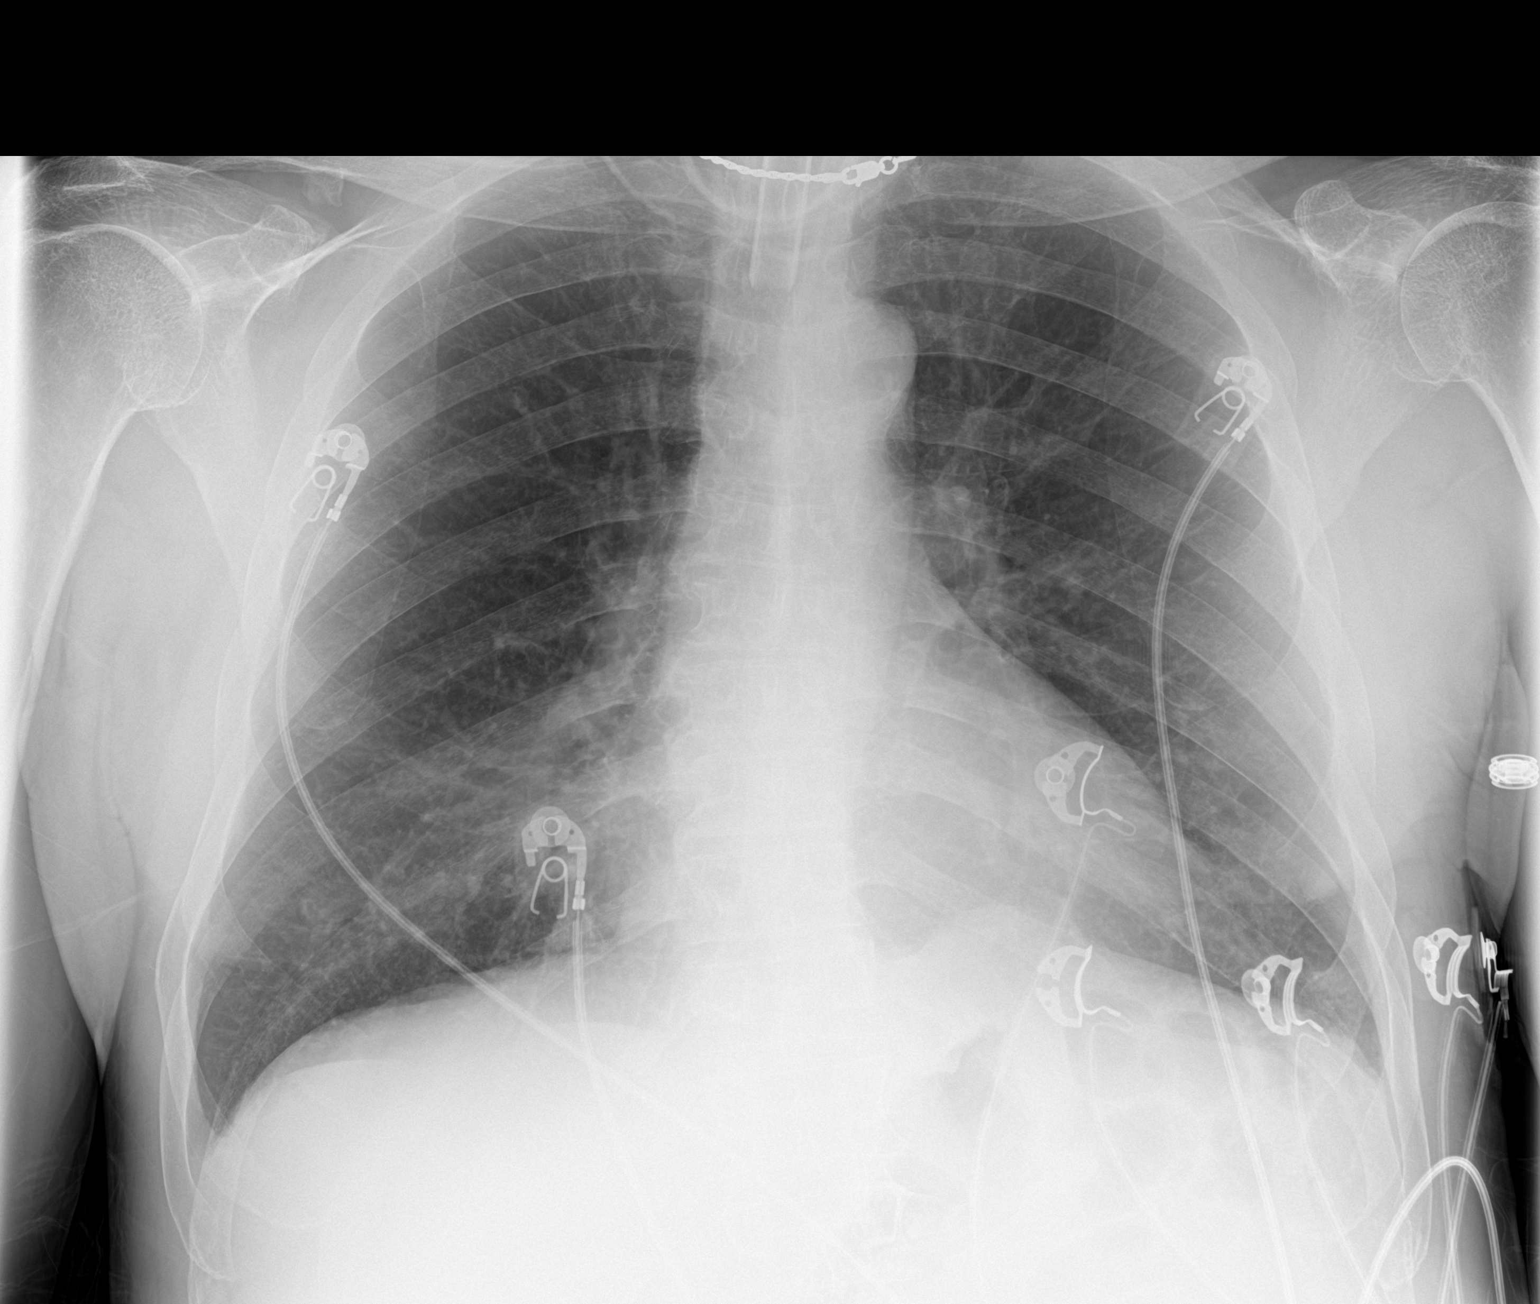

[1 of 1 positions shown; findings below may reference images not displayed]

FINDINGS: 9139 hours. The tracheostomy appears unchanged. The heart size and
mediastinal contours are stable. There is new airspace disease in
the right middle lobe which partially obscures the right heart
border. The lungs are otherwise clear. There is no pleural effusion
or pneumothorax. No acute osseous findings are evident.
IMPRESSION: 1. New right middle lobe airspace disease suspicious for pneumonia.
Radiographic follow up recommended.
2. No other significant changes.

## 2021-06-30 DIAGNOSIS — Z430 Encounter for attention to tracheostomy: Secondary | ICD-10-CM | POA: Diagnosis not present

## 2021-06-30 DIAGNOSIS — Z930 Tracheostomy status: Secondary | ICD-10-CM | POA: Diagnosis not present

## 2021-07-25 DIAGNOSIS — L309 Dermatitis, unspecified: Secondary | ICD-10-CM | POA: Diagnosis not present

## 2021-07-30 DIAGNOSIS — Z930 Tracheostomy status: Secondary | ICD-10-CM | POA: Diagnosis not present

## 2021-08-12 ENCOUNTER — Other Ambulatory Visit: Payer: Self-pay | Admitting: Internal Medicine

## 2021-08-12 DIAGNOSIS — J449 Chronic obstructive pulmonary disease, unspecified: Secondary | ICD-10-CM | POA: Diagnosis not present

## 2021-08-12 DIAGNOSIS — K219 Gastro-esophageal reflux disease without esophagitis: Secondary | ICD-10-CM | POA: Diagnosis not present

## 2021-08-12 DIAGNOSIS — J45909 Unspecified asthma, uncomplicated: Secondary | ICD-10-CM | POA: Diagnosis not present

## 2021-08-12 DIAGNOSIS — I10 Essential (primary) hypertension: Secondary | ICD-10-CM | POA: Diagnosis not present

## 2021-08-12 DIAGNOSIS — J4530 Mild persistent asthma, uncomplicated: Secondary | ICD-10-CM | POA: Diagnosis not present

## 2021-08-12 DIAGNOSIS — E785 Hyperlipidemia, unspecified: Secondary | ICD-10-CM | POA: Diagnosis not present

## 2021-08-14 DIAGNOSIS — Z930 Tracheostomy status: Secondary | ICD-10-CM | POA: Diagnosis not present

## 2021-08-14 DIAGNOSIS — J3802 Paralysis of vocal cords and larynx, bilateral: Secondary | ICD-10-CM | POA: Diagnosis not present

## 2021-08-14 DIAGNOSIS — J324 Chronic pansinusitis: Secondary | ICD-10-CM | POA: Diagnosis not present

## 2021-08-14 DIAGNOSIS — J330 Polyp of nasal cavity: Secondary | ICD-10-CM | POA: Diagnosis not present

## 2021-08-22 DIAGNOSIS — Z430 Encounter for attention to tracheostomy: Secondary | ICD-10-CM | POA: Diagnosis not present

## 2021-09-01 DIAGNOSIS — Z430 Encounter for attention to tracheostomy: Secondary | ICD-10-CM | POA: Diagnosis not present

## 2021-09-03 ENCOUNTER — Other Ambulatory Visit: Payer: Self-pay | Admitting: *Deleted

## 2021-09-03 DIAGNOSIS — K219 Gastro-esophageal reflux disease without esophagitis: Secondary | ICD-10-CM | POA: Diagnosis not present

## 2021-09-03 DIAGNOSIS — I10 Essential (primary) hypertension: Secondary | ICD-10-CM | POA: Diagnosis not present

## 2021-09-03 DIAGNOSIS — E785 Hyperlipidemia, unspecified: Secondary | ICD-10-CM | POA: Diagnosis not present

## 2021-09-03 DIAGNOSIS — J45909 Unspecified asthma, uncomplicated: Secondary | ICD-10-CM | POA: Diagnosis not present

## 2021-09-03 DIAGNOSIS — J449 Chronic obstructive pulmonary disease, unspecified: Secondary | ICD-10-CM | POA: Diagnosis not present

## 2021-09-03 DIAGNOSIS — J4530 Mild persistent asthma, uncomplicated: Secondary | ICD-10-CM | POA: Diagnosis not present

## 2021-09-03 NOTE — Patient Outreach (Signed)
[facility]) Care Management  09/03/2021  [name] [name] July 31, 1951 [id number]  Unsuccessful outreach attempt made to patient. Patient's daughter answered the phone and stated that he would not be able to speak today. She did request that this nurse call back at a later date.   Plan: RN Health Coach will call patient within the month of January.  [name] Loron RN, BSN [place] [phone] [email or web address]

## 2021-09-23 ENCOUNTER — Emergency Department (HOSPITAL_COMMUNITY): Payer: No Typology Code available for payment source

## 2021-09-23 ENCOUNTER — Other Ambulatory Visit: Payer: Self-pay

## 2021-09-23 ENCOUNTER — Emergency Department (HOSPITAL_COMMUNITY)
Admission: EM | Admit: 2021-09-23 | Discharge: 2021-09-23 | Disposition: A | Discharge status: Home / Self Care | Payer: No Typology Code available for payment source | Attending: Emergency Medicine | Admitting: Emergency Medicine

## 2021-09-23 ENCOUNTER — Encounter (HOSPITAL_COMMUNITY): Payer: Self-pay

## 2021-09-23 DIAGNOSIS — I10 Essential (primary) hypertension: Secondary | ICD-10-CM | POA: Diagnosis not present

## 2021-09-23 DIAGNOSIS — J449 Chronic obstructive pulmonary disease, unspecified: Secondary | ICD-10-CM | POA: Insufficient documentation

## 2021-09-23 DIAGNOSIS — J441 Chronic obstructive pulmonary disease with (acute) exacerbation: Secondary | ICD-10-CM

## 2021-09-23 DIAGNOSIS — R062 Wheezing: Secondary | ICD-10-CM | POA: Diagnosis not present

## 2021-09-23 DIAGNOSIS — R0602 Shortness of breath: Secondary | ICD-10-CM | POA: Insufficient documentation

## 2021-09-23 DIAGNOSIS — Z7951 Long term (current) use of inhaled steroids: Secondary | ICD-10-CM | POA: Insufficient documentation

## 2021-09-23 DIAGNOSIS — Z20822 Contact with and (suspected) exposure to covid-19: Secondary | ICD-10-CM | POA: Insufficient documentation

## 2021-09-23 DIAGNOSIS — Z79899 Other long term (current) drug therapy: Secondary | ICD-10-CM | POA: Diagnosis not present

## 2021-09-23 LAB — BLOOD GAS, VENOUS
Acid-Base Excess: 0.8 mmol/L (ref 0.0–2.0)
Bicarbonate: 25.2 mmol/L (ref 20.0–28.0)
O2 Saturation: 60.2 %
Patient temperature: 98.6
pCO2, Ven: 41.5 mmHg — ABNORMAL LOW (ref 44.0–60.0)
pH, Ven: 7.401 (ref 7.250–7.430)
pO2, Ven: 34.9 mmHg (ref 32.0–45.0)

## 2021-09-23 LAB — CBC WITH DIFFERENTIAL/PLATELET
Abs Immature Granulocytes: 0.01 10*3/uL (ref 0.00–0.07)
Basophils Absolute: 0.1 10*3/uL (ref 0.0–0.1)
Basophils Relative: 1 %
Eosinophils Absolute: 1 10*3/uL — ABNORMAL HIGH (ref 0.0–0.5)
Eosinophils Relative: 15 %
HCT: 41.7 % (ref 39.0–52.0)
Hemoglobin: 14.2 g/dL (ref 13.0–17.0)
Immature Granulocytes: 0 %
Lymphocytes Relative: 24 %
Lymphs Abs: 1.6 10*3/uL (ref 0.7–4.0)
MCH: 31.4 pg (ref 26.0–34.0)
MCHC: 34.1 g/dL (ref 30.0–36.0)
MCV: 92.3 fL (ref 80.0–100.0)
Monocytes Absolute: 0.6 10*3/uL (ref 0.1–1.0)
Monocytes Relative: 10 %
Neutro Abs: 3.3 10*3/uL (ref 1.7–7.7)
Neutrophils Relative %: 50 %
Platelets: 320 10*3/uL (ref 150–400)
RBC: 4.52 MIL/uL (ref 4.22–5.81)
RDW: 12.7 % (ref 11.5–15.5)
WBC: 6.5 10*3/uL (ref 4.0–10.5)
nRBC: 0 % (ref 0.0–0.2)

## 2021-09-23 LAB — COMPREHENSIVE METABOLIC PANEL
ALT: 21 U/L (ref 0–44)
AST: 26 U/L (ref 15–41)
Albumin: 4.2 g/dL (ref 3.5–5.0)
Alkaline Phosphatase: 75 U/L (ref 38–126)
Anion gap: 9 (ref 5–15)
BUN: 18 mg/dL (ref 8–23)
CO2: 24 mmol/L (ref 22–32)
Calcium: 9 mg/dL (ref 8.9–10.3)
Chloride: 103 mmol/L (ref 98–111)
Creatinine, Ser: 0.85 mg/dL (ref 0.61–1.24)
GFR, Estimated: >60 mL/min (ref >60)
Glucose, Bld: 97 mg/dL (ref 70–99)
Potassium: 3.9 mmol/L (ref 3.5–5.1)
Sodium: 136 mmol/L (ref 135–145)
Total Bilirubin: 0.8 mg/dL (ref 0.3–1.2)
Total Protein: 8.3 g/dL — ABNORMAL HIGH (ref 6.5–8.1)

## 2021-09-23 LAB — RESP PANEL BY RT-PCR (FLU A&B, COVID) ARPGX2
Influenza A by PCR: NEGATIVE
Influenza B by PCR: NEGATIVE
SARS Coronavirus 2 by RT PCR: NEGATIVE

## 2021-09-23 MED ORDER — ALBUTEROL SULFATE (2.5 MG/3ML) 0.083% IN NEBU
INHALATION_SOLUTION | RESPIRATORY_TRACT | Status: AC
  Filled 2021-09-23: qty 3

## 2021-09-23 MED ORDER — METHYLPREDNISOLONE SODIUM SUCC 125 MG IJ SOLR
125.0000 mg | Freq: Once | INTRAMUSCULAR | Status: AC
  Administered 2021-09-23: 125 mg via INTRAVENOUS
  Filled 2021-09-23: qty 2

## 2021-09-23 MED ORDER — ALBUTEROL (5 MG/ML) CONTINUOUS INHALATION SOLN
10.0000 mg/h | INHALATION_SOLUTION | RESPIRATORY_TRACT | Status: DC
  Administered 2021-09-23: 10 mg/h via RESPIRATORY_TRACT
  Filled 2021-09-23: qty 20

## 2021-09-23 MED ORDER — METHYLPREDNISOLONE 4 MG PO TBPK: ORAL_TABLET | ORAL | 0 refills | Status: DC

## 2021-09-23 NOTE — ED Triage Notes (Signed)
PT from c/o SOB x 1 week. Has Trach in place with audible rhonchi when breathing, states he has suctioned thick mucus out of trach before arriving today.
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
## 2021-09-23 NOTE — Progress Notes (Signed)
RT put an order for a CAT. The Pt is wheezing

## 2021-09-24 ENCOUNTER — Other Ambulatory Visit: Payer: Self-pay | Admitting: *Deleted

## 2021-09-24 DIAGNOSIS — Z430 Encounter for attention to tracheostomy: Secondary | ICD-10-CM | POA: Diagnosis not present

## 2021-09-24 NOTE — Patient Outreach (Signed)
[facility]) Care Management  09/24/2021  [name] Feb 07, 1951 [id number]  Unsuccessful outreach attempt made to patient. RN Health Coach left HIPAA compliant voicemail message along with her contact information.  Plan: RN Health Coach will call patient within the month of January.  Emelia Loron RN, BSN [place] [phone] [email or web address]

## 2021-10-03 DIAGNOSIS — J4530 Mild persistent asthma, uncomplicated: Secondary | ICD-10-CM | POA: Diagnosis not present

## 2021-10-03 DIAGNOSIS — E785 Hyperlipidemia, unspecified: Secondary | ICD-10-CM | POA: Diagnosis not present

## 2021-10-03 DIAGNOSIS — I10 Essential (primary) hypertension: Secondary | ICD-10-CM | POA: Diagnosis not present

## 2021-10-03 DIAGNOSIS — K219 Gastro-esophageal reflux disease without esophagitis: Secondary | ICD-10-CM | POA: Diagnosis not present

## 2021-10-03 DIAGNOSIS — J449 Chronic obstructive pulmonary disease, unspecified: Secondary | ICD-10-CM | POA: Diagnosis not present

## 2021-10-06 ENCOUNTER — Other Ambulatory Visit: Payer: Self-pay | Admitting: *Deleted
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
## 2021-10-06 NOTE — Patient Instructions (Signed)
Visit Information  Thank you for taking time to visit with me today. Please don't hesitate to contact me if I can be of assistance to you before our next scheduled telephone appointment.  Following are the goals we discussed today:  Patient Goals/Self-Care Activities: Take all medications as prescribed Attend all scheduled provider appointments Call pharmacy for medication refills 3-7 days in advance of running out of medications Call provider office for new concerns or questions  identify and remove indoor air pollutants limit outdoor activity during cold weather follow rescue plan if symptoms flare-up eat healthy/prescribed diet: low sodium, heart healthy do breathing exercises every day Use your nebulizer more often when your albuterol inhaler does not improve breathing Call the doctor to make an appointment if having breathing difficulty (yellow zone) for 48 hours without improvement  Continue to walk daily to exercise your lungs Avoid going outside during very cold weather   The patient verbalized understanding of instructions, educational materials, and care plan provided today and agreed to receive a mailed copy of patient instructions, educational materials, and care plan.   Telephone follow up appointment with care management team member scheduled for: April  Emelia Loron RN, [place] [phone] [email or web address]

## 2021-10-06 NOTE — Patient Outreach (Signed)
[facility]) Care Management  10/06/2021  [name] [name] 04/04/1951 [id number]  [facility]) Care Management RN Health Coach Note   10/06/2021 Name:  [name] [name] MRN:  [id number] DOB:  [birth year]  Summary: Patient reports that he went to the ED on 09/23/21 due to COPD exacerbation. No infection was found, he was given a nebulizer, IV steroids, had his trach suctioned, and discharged home with a prescription for methylprednisolone. Patient states he feels that the cold weather triggered his exacerbation. Eduction was given about cold weather, discussed using his albuterol nebulizer at home when his albuterol inhaler is not working, and to call his providers when he continues to have breathing difficulty after 48 hours. Patient reports feeling much better presently. Explaining that he did receive saline bullets from [facility] to suction his trach. Patient reports that his home environment is safe. Patient did not have any further questions or concerns today and did confirm that he has this nurse's contact number to call her if needed.   Recommendations/Changes made from today's visit: Use your nebulizer more often when your albuterol inhaler does not improve breathing Call the doctor to make an appointment if having breathing difficulty (yellow zone) for 48 hours without improvement  Continue to walk daily to exercise your lungs Avoid going outside during very cold weather   Subjective: [name] [name] is an 70 y.o. year old male who is a primary patient of Caren Macadam, MD. The care management team was consulted for assistance with care management and/or care coordination needs.    RN Health Coach completed Telephone Visit today.   Objective:  Medications Reviewed Today     Reviewed by Michiel Cowboy, RN (Registered Nurse) on 06/02/21 at [place] [facility] Status: <None>   Medication Order Taking? Sig Documenting Provider Last Dose Status Informant   acetaminophen 325 MG tablet [id number] Yes Take 2 tablets (650 mg total) by mouth every 6 (six) hours as needed.  Patient taking differently: Take 650 mg by mouth every 6 (six) hours as needed for pain.   Jillyn Ledger, PA-C Taking Active   albuterol (PROVENTIL) (2.5 MG/3ML) 0.083% nebulizer solution [id number] Yes Take 3 mLs (2.5 mg total) by nebulization every 4 (four) hours as needed for wheezing or shortness of breath. Tanda Rockers, MD Taking Active Self  albuterol (VENTOLIN HFA) 108 (90 Base) MCG/ACT inhaler [id number] Yes INHALE 2 PUFFS INTO THE LUNGS EVERY 6 HOURS AS NEEDED  Patient taking differently: Inhale 2 puffs into the lungs every 6 (six) hours as needed for wheezing or shortness of breath.   Tanda Rockers, MD Taking Active   carboxymethylcellulose (REFRESH PLUS) 0.5 % SOLN [id number] Yes Apply to eye. [provider] Taking Active   Carboxymethylcellulose Sod PF (REFRESH PLUS) 0.5 % SOLN [id number] Yes Place 1 drop into both eyes 2 (two) times daily as needed (dry eyes). [provider] Taking Active Self  cetirizine (ZYRTEC) 10 MG tablet [id number] Yes Take 10 mg by mouth daily as needed for allergies.  [provider] Taking Active Self  cyclobenzaprine (FLEXERIL) 10 MG tablet [id number] Yes Take by mouth. [provider] Taking Active   docusate sodium (COLACE) 100 MG capsule [id number] Yes Take 1 capsule (100 mg total) by mouth 2 (two) times daily as needed for mild constipation. Maczis, Barth Kirks, PA-C Taking Active            Med Note Laretta Alstrom, Lukasz Rogus A   Fri Nov 15, 2020 11:39 AM) Not  needed  finasteride (PROSCAR) 5 MG tablet [id number] Yes Take 5 mg by mouth daily. [provider] Taking Active Self  fluticasone (FLONASE) 50 MCG/ACT nasal spray [id number] Yes Place 2 sprays into both nostrils daily as needed for allergies.  [provider] Taking Active Self  guaiFENesin (MUCINEX) 600 MG 12 hr tablet [id number] Yes Take 1 tablet  (600 mg total) by mouth 2 (two) times daily as needed for to loosen phlegm.  Patient taking differently: Take 600 mg by mouth daily.   Maczis, Barth Kirks, PA-C Taking Active   hydrochlorothiazide (HYDRODIURIL) 25 MG tablet [id number] Yes Take 1 tablet (25 mg total) by mouth daily. Lavina Hamman, MD Taking Active Self           Med Note Damita Dunnings, MACI D   Sun Sep 10, 2019 12:33 PM)    ibuprofen (ADVIL) 800 MG tablet [id number] Yes Take 1 tablet (800 mg total) by mouth every 8 (eight) hours as needed. Kinsinger, Arta Bruce, MD Taking Active            Med Note Laretta Alstrom, Alexx Giambra A   Fri Nov 15, 2020 11:40 AM) completed  losartan (COZAAR) 25 MG tablet [id number] Yes Take 25 mg by mouth at bedtime. [provider] Taking Active   losartan (COZAAR) 50 MG tablet [id number] Yes Take 50 mg by mouth daily.  [provider] Taking Active Self  mometasone-formoterol (DULERA) 100-5 MCG/ACT Hollie Salk [id number] Yes Inhale 2 puffs into the lungs 2 (two) times daily. [provider] Taking Active Self  montelukast (SINGULAIR) 10 MG tablet [id number] Yes Take 1 tablet by mouth daily. [provider] Taking Active   Multiple Vitamin (MULTIVITAMIN) tablet [id number] Yes Take 1 tablet by mouth daily. [provider] Taking Active Self  mupirocin ointment (BACTROBAN) 2 % [id number] Yes Apply topically. [provider] Taking Active   pantoprazole (PROTONIX) 40 MG tablet [id number] Yes Take 1 tablet by mouth daily. [provider] Taking Active   sildenafil (VIAGRA) 100 MG tablet [id number] Yes Take by mouth. [provider] Taking Active   sodium chloride (OCEAN) 0.65 % nasal spray [id number] Yes Place into the nose. [provider] Taking Active   sodium chloride 0.9 % nebulizer solution [id number] Yes Take 3-4 mLs by nebulization daily as needed. Use 3-4 drops into Trach to loosen phlegm suction secretions immediatly after.  [provider] Taking  Active Self           Med Note Laretta Alstrom, Yago Ludvigsen A   Fri Nov 15, 2020 11:42 AM) Reports he has not been able to get the bullets and is using NS bottle to measure and put down trach  sodium chloride 0.9 % nebulizer solution [id number] Yes Inhale into the lungs. [provider] Taking Active   terazosin (HYTRIN) 5 MG capsule [id number] Yes Take 5 mg by mouth at bedtime.  [provider] Taking Active Self  terazosin (HYTRIN) 5 MG capsule [id number] Yes Take 1 capsule by mouth at bedtime. [provider] Taking Active   Med List Note Lajuana Carry 03/10/16 [id number]): He also uses the [facility] in [place] for his pharmacy.             SDOH:  (Social Determinants of Health) assessments and interventions performed: SDOH assessments completed today and documented in the Epic system.    Care Plan  Review of patient past medical history, allergies, medications, health status, including review of consultants reports, laboratory and other test data, was performed as  part of comprehensive evaluation for care management services.   Care Plan : COPD (Adult)  Updates made by Michiel Cowboy, RN since 10/06/2021 12:00 AM     Problem: Disease Progression (COPD) Resolved 10/06/2021  Priority: High     Long-Range Goal: Disease Progression Minimized or Managed Completed 10/06/2021  Start Date: 07/17/2020  Expected End Date: 07/17/2021  Note:   Resolving due to duplicate goal  Evidence-based guidance:  Identify current smoking/tobacco use; provide smoking cessation intervention.  Assess symptom control by the frequency and type of symptoms, reliever use and activity limitation at every encounter.  Assess risk for exacerbation (flare up) by evaluating spirometry, pulse oximetry, reliever use, presentation of symptoms and activity limitation; anticipate treatment adjustment based on risks and resources.  Develop and/or review and reinforce use of COPD rescue (action) plan even when symptoms are  controlled or infrequent.  Ask patient to bring inhaler to all visits; assess and reinforce correct technique; address barriers to proper inhaler use, such as older age, use of multiple devices and lack of understanding.   Identify symptom triggers, such as smoking, virus, weather change, emotional upset, exercise, obesity and environmental allergen; consider reduction of work-exposure versus elimination to avoid compromising employment.  Correlate presentation to comorbidity, such as diabetes, heart failure, obstructive sleep apnea, depression and anxiety, which may worsen symptoms.  Prepare for individualized pharmacologic therapy that may include LABA (long-acting beta-2 agonist), LAMA (long-acting muscarinic antagonist), SABA (short-acting beta-2 agonist) oral or inhaled corticosteroid.  Promote participation in pulmonary rehabilitation for breathing exercises, skills training, improved exercise capacity, mood and quality of life; address barriers to participation.  Promote physical activity or exercise to improve or maintain exercise capacity, based on tolerance that may include walking, water exercise, cycling or limb muscle strength training.  Promote use of energy conservation and activity pacing techniques.  Promote use of breathing and coughing techniques, such as inspiratory muscle training, pursed-lip breathing, diaphragmatic breathing, pranayama yoga breathing or huff cough.  Screen for malnutrition risk factors, such as unintentional weight loss and poor oral intake; refer to dietitian if identified.  Consider recommendation for oral drink supplement or multivitamin and mineral supplements if suspect inadequate oral intake or micronutrient deficiencies.   Screen for obstructive sleep apnea; prepare patient for polysomnography based on risk and presentation.  Prepare patient for use of long-term oxygen and noninvasive ventilation to relieve hypercapnia, hypoxemia, obstructive sleep apnea and  reduce work of breathing.  Prepare patient with worsening disease for surgical interventions that may include bronchoscopy, lung volume reduction surgery, bullectomy or lung transplantation.   Notes:     Task: Alleviate Barriers to COPD Management Completed 10/06/2021  Due Date: 07/17/2021  Note:   Care Management Activities:    - activity or exercise based on tolerance encouraged - breathing techniques encouraged - individualized medical nutrition therapy provided - medication-adherence assessment completed - quality of sleep assessed - rescue (action) plan reviewed - self-awareness of symptom triggers encouraged    Notes:     Problem: Symptom Exacerbation (COPD) Resolved 10/06/2021  Priority: High     Long-Range Goal: Symptom Exacerbation Prevented or Minimized Completed 10/06/2021  Start Date: 07/17/2020  Expected End Date: 07/17/2021  Note:   Resolving due to duplicate goal  Evidence-based guidance:  Monitor for signs of respiratory infection, including changes in sputum color, volume and thickness, as well as fever.  Encourage infection prevention strategies that may include prophylactic antibiotic therapy for patients with history of frequent exacerbations or antibiotic administration during exacerbation based  on presentation, risk and benefit.  Encourage receipt of influenza and pneumococcal vaccine.  Prepare patient for use of home long-term oxygen therapy in presence of sever resting hypoxemia.  Prepare patients for laboratory studies or diagnostic exams, such as spirometry, pulse oximetry and arterial blood gas based on current symptoms, risk factors and presentation.  Assess barriers and manage adherence, including inhaler technique and persistent trigger exposure; encourage adherence, even when symptoms are controlled or infrequent.  Assess and monitor for signs/symptoms of psychosocial concerns, such as shortness of breath-anxiety cycle or depression that may impact  stability of symptoms.  Identify economic resources, sociocultural beliefs, social factors and health literacy that may interfere with adherence.  Promote lifestyle changes when needed, including regular physical activity based on tolerance, weight loss, healthy eating and stress management.  Consider referral to nurse or community health worker or home-visiting program for intensive support and education (disease-management program).  Increase frequency of follow-up following exacerbation or hospitalization; consider transition of care interventions, such as hospital visit, home visit, telephone follow-up, review of discharge summary and resource referrals.   Notes:     Task: Identify and Minimize Risk of COPD Exacerbation Completed 10/06/2021  Due Date: 07/17/2021  Note:   Care Management Activities:    - barriers to treatment reviewed and addressed - breathing techniques encouraged - healthy lifestyle promoted - necessary immunization facilitated - rescue (action) plan reviewed - signs/symptoms of infection reviewed - signs/symptoms of worsening disease assessed - symptom triggers identified - treatment plan reviewed    Notes:     Care Plan : RN Care Manager Plan of Care  Updates made by Michiel Cowboy, RN since 10/06/2021 12:00 AM     Problem: Knowledge Deficit Related to COPD   Priority: High     Long-Range Goal: Development of Plan of Care for the Management of COPD   Start Date: 10/06/2021  Expected End Date: 10/13/2021  Priority: High  Note:   Current Barriers:  Chronic Disease Management support and education needs related to COPD   RNCM Clinical Goal(s):  Patient will take all medications exactly as prescribed and will call provider for medication related questions as evidenced by Patients verbalization of taking prescribed medications as written and contacting his providers for questions and concerns demonstrate Improved adherence to prescribed treatment plan for COPD  as evidenced by Patient's continuation of taking his control inhalers daily, rescue medications as needed, maintaining his trach without complications; and walking daily continue to work with [place] to address care management and care coordination needs related to  COPD as evidenced by adherence to CM Team Scheduled appointments through collaboration with RN Care manager, provider, and care team.   Interventions: Inter-disciplinary care team collaboration (see longitudinal plan of care) Evaluation of current treatment plan related to  self management and patient's adherence to plan as established by provider   COPD Interventions:  (Status:  Goal on track:  Yes.) Long Term Goal Provided patient with basic written and verbal COPD education on self care/management/and exacerbation prevention Advised patient to track and manage COPD triggers Provided instruction about proper use of medications used for management of COPD including inhalers Advised patient to self assesses COPD action plan zone and make appointment with provider if in the yellow zone for 48 hours without improvement Advised patient to engage in light exercise as tolerated 3-5 days a week to aid in the the management of COPD Provided education about and advised patient to utilize infection prevention strategies to reduce  risk of respiratory infection Screening for signs and symptoms of depression related to chronic disease state   Patient Goals/Self-Care Activities: Take all medications as prescribed Attend all scheduled provider appointments Call pharmacy for medication refills 3-7 days in advance of running out of medications Call provider office for new concerns or questions  identify and remove indoor air pollutants limit outdoor activity during cold weather follow rescue plan if symptoms flare-up eat healthy/prescribed diet: low sodium, heart healthy do breathing exercises every day Use your nebulizer more often  when your albuterol inhaler does not improve breathing Call the doctor to make an appointment if having breathing difficulty (yellow zone) for 48 hours without improvement  Continue to walk daily to exercise your lungs Avoid going outside during very cold weather  Follow Up Plan:  Telephone follow up appointment with care management team member scheduled for:  April       Plan: Telephone follow up appointment with care management team member scheduled for:  April. Nurse will send PCP today's assessment note.  Emelia Loron RN, [place] [phone] [email or web address]

## 2021-10-06 NOTE — Patient Outreach (Deleted)
[facility]) Care Management  10/06/2021  [name] [name] Jun 22, 1951 [id number]  Unsuccessful outreach attempt made to patient. RN Health Coach left HIPAA compliant voicemail message along with her contact information.  Plan: RN Health Coach will call patient within the month of February.  Emelia Loron RN, BSN [place] [phone] [email or web address]
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
## 2021-10-10 ENCOUNTER — Other Ambulatory Visit: Payer: Self-pay | Admitting: *Deleted
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
## 2021-10-10 NOTE — Patient Outreach (Signed)
[name]) Care Management  10/10/2021  [name] [name] 1950/12/09 [id number]  Successful telephone outreach call to patient. HIPAA identifiers obtained. Nurse called patient back in response to the VM is left for nurse. Patient explained that he has not been able to reach his PCP. Patient reports that he was seen previously for dermatitis and prescribed a medication which is not working. Presently he is miserable with continued itching a soreness occurring on both underarms and down both sides. He would like to see if another medication could be called in to treat or if he could be seen for a follow up.   Nurse called the Patient's PCP office to inform them regarding the above information. Receptionist who answered sent a message to inform Dr. Mannie Stabile and stated that the office would call the patient back.   Nurse called the patient back to notify him that he should receive a callback from his PCP's office. Patient verbalized understanding and was appreciative for this nurse's assistance.   Emelia Loron RN, [place] [phone] [email or web address]

## 2021-10-16 DIAGNOSIS — L299 Pruritus, unspecified: Secondary | ICD-10-CM | POA: Diagnosis not present

## 2021-10-20 ENCOUNTER — Telehealth: Payer: Self-pay | Admitting: Internal Medicine

## 2021-10-20 MED ORDER — MONTELUKAST SODIUM 10 MG PO TABS: 10.0000 mg | ORAL_TABLET | Freq: Every day | ORAL | 4 refills | Status: DC

## 2021-10-20 NOTE — Telephone Encounter (Signed)
Singulair 10mg  has been sent to preferred pharmacy. Patient's daughter, [name](DPR) is aware and voiced her understanding.  Nothing further needed.

## 2021-10-21 DIAGNOSIS — Z930 Tracheostomy status: Secondary | ICD-10-CM | POA: Diagnosis not present

## 2021-11-07 DIAGNOSIS — I10 Essential (primary) hypertension: Secondary | ICD-10-CM | POA: Diagnosis not present

## 2021-11-07 DIAGNOSIS — J449 Chronic obstructive pulmonary disease, unspecified: Secondary | ICD-10-CM | POA: Diagnosis not present

## 2021-11-07 DIAGNOSIS — J4530 Mild persistent asthma, uncomplicated: Secondary | ICD-10-CM | POA: Diagnosis not present

## 2021-11-11 DIAGNOSIS — J324 Chronic pansinusitis: Secondary | ICD-10-CM | POA: Diagnosis not present

## 2021-11-11 DIAGNOSIS — J339 Nasal polyp, unspecified: Secondary | ICD-10-CM | POA: Diagnosis not present

## 2021-11-13 DIAGNOSIS — Z20822 Contact with and (suspected) exposure to covid-19: Secondary | ICD-10-CM | POA: Diagnosis not present

## 2021-11-17 DIAGNOSIS — Z430 Encounter for attention to tracheostomy: Secondary | ICD-10-CM | POA: Diagnosis not present

## 2021-11-19 DIAGNOSIS — L299 Pruritus, unspecified: Secondary | ICD-10-CM | POA: Diagnosis not present

## 2021-11-20 ENCOUNTER — Ambulatory Visit: Payer: Medicare Other | Admitting: Internal Medicine

## 2021-11-20 ENCOUNTER — Ambulatory Visit: Payer: Medicare Other | Admitting: Adult Health

## 2021-12-03 DIAGNOSIS — J449 Chronic obstructive pulmonary disease, unspecified: Secondary | ICD-10-CM | POA: Diagnosis not present

## 2021-12-03 DIAGNOSIS — I10 Essential (primary) hypertension: Secondary | ICD-10-CM | POA: Diagnosis not present

## 2021-12-12 ENCOUNTER — Other Ambulatory Visit: Payer: Self-pay | Admitting: *Deleted

## 2021-12-12 NOTE — Patient Outreach (Signed)
[name] Care Management ? ?12/12/2021 ? ?[name] [name] ?16-Jan-1951 ?[id number] ? ?Nurse spoke with patient to inform them that their PCP has a RN CM working in the office that will follow up with them in the future. Patient verbalized understanding.  ? ?Plan: ?RN Health Coach will close case and will send patient a case closed letter.  ? ?[name] Loron RN, BSN ?[facility] Care Management  ?RN Health Coach ?[phone] ?[email or web address] ? ? ?

## 2021-12-29 ENCOUNTER — Ambulatory Visit: Payer: Non-veteran care | Admitting: *Deleted

## 2022-01-01 DIAGNOSIS — J449 Chronic obstructive pulmonary disease, unspecified: Secondary | ICD-10-CM | POA: Diagnosis not present

## 2022-01-01 DIAGNOSIS — I10 Essential (primary) hypertension: Secondary | ICD-10-CM | POA: Diagnosis not present

## 2022-01-01 DIAGNOSIS — J4530 Mild persistent asthma, uncomplicated: Secondary | ICD-10-CM | POA: Diagnosis not present

## 2022-01-01 DIAGNOSIS — E782 Mixed hyperlipidemia: Secondary | ICD-10-CM | POA: Diagnosis not present

## 2022-01-02 DIAGNOSIS — Z430 Encounter for attention to tracheostomy: Secondary | ICD-10-CM | POA: Diagnosis not present

## 2022-01-08 DIAGNOSIS — Z930 Tracheostomy status: Secondary | ICD-10-CM | POA: Diagnosis not present

## 2022-01-09 DIAGNOSIS — L539 Erythematous condition, unspecified: Secondary | ICD-10-CM | POA: Diagnosis not present

## 2022-01-09 DIAGNOSIS — L299 Pruritus, unspecified: Secondary | ICD-10-CM | POA: Diagnosis not present

## 2022-01-20 DIAGNOSIS — E782 Mixed hyperlipidemia: Secondary | ICD-10-CM | POA: Diagnosis not present

## 2022-01-20 DIAGNOSIS — J3800 Paralysis of vocal cords and larynx, unspecified: Secondary | ICD-10-CM | POA: Diagnosis not present

## 2022-01-20 DIAGNOSIS — R7303 Prediabetes: Secondary | ICD-10-CM | POA: Diagnosis not present

## 2022-01-20 DIAGNOSIS — Z9109 Other allergy status, other than to drugs and biological substances: Secondary | ICD-10-CM | POA: Diagnosis not present

## 2022-01-20 DIAGNOSIS — J449 Chronic obstructive pulmonary disease, unspecified: Secondary | ICD-10-CM | POA: Diagnosis not present

## 2022-01-20 DIAGNOSIS — I10 Essential (primary) hypertension: Secondary | ICD-10-CM | POA: Diagnosis not present

## 2022-01-20 DIAGNOSIS — Z23 Encounter for immunization: Secondary | ICD-10-CM | POA: Diagnosis not present

## 2022-01-20 DIAGNOSIS — Z930 Tracheostomy status: Secondary | ICD-10-CM | POA: Diagnosis not present

## 2022-01-20 DIAGNOSIS — Z0000 Encounter for general adult medical examination without abnormal findings: Secondary | ICD-10-CM | POA: Diagnosis not present

## 2022-01-29 DIAGNOSIS — J3489 Other specified disorders of nose and nasal sinuses: Secondary | ICD-10-CM | POA: Diagnosis not present

## 2022-01-29 DIAGNOSIS — Z930 Tracheostomy status: Secondary | ICD-10-CM | POA: Diagnosis not present

## 2022-01-29 DIAGNOSIS — J3802 Paralysis of vocal cords and larynx, bilateral: Secondary | ICD-10-CM | POA: Diagnosis not present

## 2022-01-29 DIAGNOSIS — J324 Chronic pansinusitis: Secondary | ICD-10-CM | POA: Diagnosis not present

## 2022-01-31 ENCOUNTER — Other Ambulatory Visit: Payer: Self-pay

## 2022-01-31 ENCOUNTER — Emergency Department (HOSPITAL_COMMUNITY): Payer: No Typology Code available for payment source

## 2022-01-31 ENCOUNTER — Emergency Department (HOSPITAL_COMMUNITY)
Admission: EM | Admit: 2022-01-31 | Discharge: 2022-01-31 | Disposition: A | Discharge status: Home / Self Care | Payer: No Typology Code available for payment source | Attending: Emergency Medicine | Admitting: Emergency Medicine

## 2022-01-31 ENCOUNTER — Encounter (HOSPITAL_COMMUNITY): Payer: Self-pay | Admitting: Emergency Medicine

## 2022-01-31 DIAGNOSIS — J398 Other specified diseases of upper respiratory tract: Secondary | ICD-10-CM | POA: Diagnosis not present

## 2022-01-31 DIAGNOSIS — J441 Chronic obstructive pulmonary disease with (acute) exacerbation: Secondary | ICD-10-CM

## 2022-01-31 DIAGNOSIS — R000 Tachycardia, unspecified: Secondary | ICD-10-CM | POA: Insufficient documentation

## 2022-01-31 DIAGNOSIS — Z7951 Long term (current) use of inhaled steroids: Secondary | ICD-10-CM | POA: Diagnosis not present

## 2022-01-31 DIAGNOSIS — Z20822 Contact with and (suspected) exposure to covid-19: Secondary | ICD-10-CM | POA: Insufficient documentation

## 2022-01-31 DIAGNOSIS — Z79899 Other long term (current) drug therapy: Secondary | ICD-10-CM | POA: Insufficient documentation

## 2022-01-31 DIAGNOSIS — R0682 Tachypnea, not elsewhere classified: Secondary | ICD-10-CM | POA: Insufficient documentation

## 2022-01-31 DIAGNOSIS — R0602 Shortness of breath: Secondary | ICD-10-CM | POA: Diagnosis present

## 2022-01-31 LAB — CBC WITH DIFFERENTIAL/PLATELET
Abs Immature Granulocytes: 0.01 10*3/uL (ref 0.00–0.07)
Basophils Absolute: 0 10*3/uL (ref 0.0–0.1)
Basophils Relative: 0 %
Eosinophils Absolute: 0.2 10*3/uL (ref 0.0–0.5)
Eosinophils Relative: 4 %
HCT: 41.9 % (ref 39.0–52.0)
Hemoglobin: 14.7 g/dL (ref 13.0–17.0)
Immature Granulocytes: 0 %
Lymphocytes Relative: 23 %
Lymphs Abs: 1.2 10*3/uL (ref 0.7–4.0)
MCH: 32.3 pg (ref 26.0–34.0)
MCHC: 35.1 g/dL (ref 30.0–36.0)
MCV: 92.1 fL (ref 80.0–100.0)
Monocytes Absolute: 0.4 10*3/uL (ref 0.1–1.0)
Monocytes Relative: 9 %
Neutro Abs: 3.2 10*3/uL (ref 1.7–7.7)
Neutrophils Relative %: 64 %
Platelets: 271 10*3/uL (ref 150–400)
RBC: 4.55 MIL/uL (ref 4.22–5.81)
RDW: 13.7 % (ref 11.5–15.5)
WBC: 5 10*3/uL (ref 4.0–10.5)
nRBC: 0 % (ref 0.0–0.2)

## 2022-01-31 LAB — BLOOD GAS, VENOUS
Acid-base deficit: 0.8 mmol/L (ref 0.0–2.0)
Bicarbonate: 24.3 mmol/L (ref 20.0–28.0)
O2 Saturation: 93.7 %
Patient temperature: 37
pCO2, Ven: 41 mmHg — ABNORMAL LOW (ref 44–60)
pH, Ven: 7.38 (ref 7.25–7.43)
pO2, Ven: 66 mmHg — ABNORMAL HIGH (ref 32–45)

## 2022-01-31 LAB — COMPREHENSIVE METABOLIC PANEL
ALT: 24 U/L (ref 0–44)
AST: 26 U/L (ref 15–41)
Albumin: 4.1 g/dL (ref 3.5–5.0)
Alkaline Phosphatase: 73 U/L (ref 38–126)
Anion gap: 11 (ref 5–15)
BUN: 15 mg/dL (ref 8–23)
CO2: 23 mmol/L (ref 22–32)
Calcium: 9.4 mg/dL (ref 8.9–10.3)
Chloride: 104 mmol/L (ref 98–111)
Creatinine, Ser: 0.78 mg/dL (ref 0.61–1.24)
GFR, Estimated: >60 mL/min (ref >60)
Glucose, Bld: 88 mg/dL (ref 70–99)
Potassium: 3.9 mmol/L (ref 3.5–5.1)
Sodium: 138 mmol/L (ref 135–145)
Total Bilirubin: 0.7 mg/dL (ref 0.3–1.2)
Total Protein: 8.3 g/dL — ABNORMAL HIGH (ref 6.5–8.1)

## 2022-01-31 LAB — RESP PANEL BY RT-PCR (FLU A&B, COVID) ARPGX2
Influenza A by PCR: NEGATIVE
Influenza B by PCR: NEGATIVE
SARS Coronavirus 2 by RT PCR: NEGATIVE

## 2022-01-31 MED ORDER — METHYLPREDNISOLONE 4 MG PO TBPK: ORAL_TABLET | ORAL | 0 refills | Status: DC

## 2022-01-31 MED ORDER — IPRATROPIUM BROMIDE 0.02 % IN SOLN
0.5000 mg | Freq: Once | RESPIRATORY_TRACT | Status: AC
Start: 2022-01-31 — End: 2022-01-31
  Administered 2022-01-31: 0.5 mg via RESPIRATORY_TRACT
  Filled 2022-01-31: qty 2.5

## 2022-01-31 MED ORDER — ALBUTEROL SULFATE (2.5 MG/3ML) 0.083% IN NEBU
5.0000 mg | INHALATION_SOLUTION | Freq: Once | RESPIRATORY_TRACT | Status: AC
  Administered 2022-01-31: 5 mg via RESPIRATORY_TRACT
  Filled 2022-01-31: qty 6

## 2022-01-31 MED ORDER — METHYLPREDNISOLONE SODIUM SUCC 125 MG IJ SOLR
125.0000 mg | Freq: Once | INTRAMUSCULAR | Status: AC
Start: 2022-01-31 — End: 2022-01-31
  Administered 2022-01-31: 125 mg via INTRAVENOUS
  Filled 2022-01-31: qty 2

## 2022-01-31 MED ORDER — MAGNESIUM SULFATE 2 GM/50ML IV SOLN
2.0000 g | Freq: Once | INTRAVENOUS | Status: AC
  Administered 2022-01-31: 2 g via INTRAVENOUS
  Filled 2022-01-31: qty 50

## 2022-01-31 NOTE — ED Triage Notes (Signed)
Patient c/o SOB with cough x1 week. Hx COPD. Using albuterol at home with little relief. Patient has a trach. Difficulty speaking in full sentences.

## 2022-01-31 NOTE — Progress Notes (Addendum)
RT came to assess new trach patient, patient states he needs to be suctioned.  RT placed patient on ATC 5L 28% and suctioned patient with very minimal secretions. RT bag lavaged patient with no secretions obtained.  Patient has exp. wheezes throughout.  Albuterol/Atrovent treatment started. RT suctioned patient's trach again with a small amount of white frothy secretions obtained.
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
## 2022-01-31 NOTE — Discharge Instructions (Addendum)
Take Medrol Dosepak as prescribed  Please continue to suction your trach at home  Continue albuterol every 4 hours as needed  Please see your ENT doctor  Return to ER if you have worse shortness of breath, trouble breathing, fever

## 2022-02-02 DIAGNOSIS — Z430 Encounter for attention to tracheostomy: Secondary | ICD-10-CM | POA: Diagnosis not present

## 2022-02-05 ENCOUNTER — Ambulatory Visit (INDEPENDENT_AMBULATORY_CARE_PROVIDER_SITE_OTHER): Payer: No Typology Code available for payment source | Admitting: Nurse Practitioner

## 2022-02-05 ENCOUNTER — Encounter: Payer: Self-pay | Admitting: Nurse Practitioner

## 2022-02-05 DIAGNOSIS — A498 Other bacterial infections of unspecified site: Secondary | ICD-10-CM | POA: Diagnosis not present

## 2022-02-05 DIAGNOSIS — J4531 Mild persistent asthma with (acute) exacerbation: Secondary | ICD-10-CM | POA: Diagnosis not present

## 2022-02-05 DIAGNOSIS — Z930 Tracheostomy status: Secondary | ICD-10-CM

## 2022-02-05 HISTORY — DX: Other bacterial infections of unspecified site: A49.8
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
## 2022-02-05 NOTE — Assessment & Plan Note (Addendum)
Flare related to acute pseudomonal bronchitis infection. Clinically improving.  Advised he complete Medrol Dosepak as previously prescribed and increase albuterol nebs to Twice daily until symptoms resolve then go back to PRN. Continue claritin and singulair for trigger prevention.   Patient Instructions  Continue Symbicort 2 puffs Twice daily. Brush tongue and rinse mouth afterwards Continue Albuterol inhaler 2 puffs or 3 mL neb every 6 hours as needed for shortness of breath or wheezing. Notify if symptoms persist despite rescue inhaler/neb use. Use twice daily until symptoms improve Continue zyrtec 10 mg daily for allergies Continue singulair 10 mg At bedtime  Continue flonase nasal spray 2 sprays each nostril daily Continue mucinex (guaifenesin) 600 mg Twice daily to loosen phlegm Continue sodium chloride nebs 3-4 mL every daily as needed; can also use 3-4 drops into the trach to loosen phlegm before suctioning Complete medrol dosepak as previously prescribed  Start Ciprofloxacin and complete course as directed by ENT  Follow up in one month with Dr. Melvyn Novas or Alanson Aly. If symptoms do not improve or worsen, please contact office for sooner follow up or seek emergency care.

## 2022-02-05 NOTE — Assessment & Plan Note (Addendum)
Increased tracheal secretions. Sputum culture positive for pseudomonas per ENT - sensitive to cipro; acute pseudomonal bronchitis. Pt is leaving here to pickup prescription and start today. Advised to notify if symptoms do not improve or worsen. CXR was clear in ED. Continue mucociliary clearance therapies.

## 2022-02-05 NOTE — Patient Instructions (Addendum)
Continue Symbicort 2 puffs Twice daily. Brush tongue and rinse mouth afterwards Continue Albuterol inhaler 2 puffs or 3 mL neb every 6 hours as needed for shortness of breath or wheezing. Notify if symptoms persist despite rescue inhaler/neb use. Use twice daily until symptoms improve Continue zyrtec 10 mg daily for allergies Continue singulair 10 mg At bedtime  Continue flonase nasal spray 2 sprays each nostril daily Continue mucinex (guaifenesin) 600 mg Twice daily to loosen phlegm Continue sodium chloride nebs 3-4 mL every daily as needed; can also use 3-4 drops into the trach to loosen phlegm before suctioning Complete medrol dosepak as previously prescribed  Start Ciprofloxacin and complete course as directed by ENT  Follow up in one month with Dr. Melvyn Novas or Alanson Aly. If symptoms do not improve or worsen, please contact office for sooner follow up or seek emergency care.
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
## 2022-02-06 DIAGNOSIS — L608 Other nail disorders: Secondary | ICD-10-CM | POA: Diagnosis not present

## 2022-02-06 DIAGNOSIS — L299 Pruritus, unspecified: Secondary | ICD-10-CM | POA: Diagnosis not present
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
## 2022-03-03 DIAGNOSIS — J449 Chronic obstructive pulmonary disease, unspecified: Secondary | ICD-10-CM | POA: Diagnosis not present

## 2022-03-03 DIAGNOSIS — I10 Essential (primary) hypertension: Secondary | ICD-10-CM | POA: Diagnosis not present

## 2022-03-03 DIAGNOSIS — E785 Hyperlipidemia, unspecified: Secondary | ICD-10-CM | POA: Diagnosis not present

## 2022-03-06 DIAGNOSIS — Z430 Encounter for attention to tracheostomy: Secondary | ICD-10-CM | POA: Diagnosis not present
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
## 2022-03-09 ENCOUNTER — Ambulatory Visit: Payer: Medicare Other | Admitting: Internal Medicine

## 2022-03-10 ENCOUNTER — Other Ambulatory Visit: Payer: Self-pay | Admitting: Internal Medicine

## 2022-03-11 DIAGNOSIS — L299 Pruritus, unspecified: Secondary | ICD-10-CM | POA: Diagnosis not present

## 2022-03-11 DIAGNOSIS — R21 Rash and other nonspecific skin eruption: Secondary | ICD-10-CM | POA: Diagnosis not present

## 2022-03-18 DIAGNOSIS — Z930 Tracheostomy status: Secondary | ICD-10-CM | POA: Diagnosis not present

## 2022-03-29 ENCOUNTER — Encounter (HOSPITAL_COMMUNITY): Payer: Self-pay

## 2022-03-29 ENCOUNTER — Emergency Department (HOSPITAL_COMMUNITY): Payer: Medicare Other

## 2022-03-29 ENCOUNTER — Other Ambulatory Visit: Payer: Self-pay

## 2022-03-29 ENCOUNTER — Emergency Department (HOSPITAL_COMMUNITY)
Admission: EM | Admit: 2022-03-29 | Discharge: 2022-03-29 | Disposition: A | Discharge status: Home / Self Care | Payer: Medicare Other | Attending: Emergency Medicine | Admitting: Emergency Medicine

## 2022-03-29 DIAGNOSIS — R0602 Shortness of breath: Secondary | ICD-10-CM | POA: Diagnosis not present

## 2022-03-29 DIAGNOSIS — Z7951 Long term (current) use of inhaled steroids: Secondary | ICD-10-CM | POA: Insufficient documentation

## 2022-03-29 DIAGNOSIS — J441 Chronic obstructive pulmonary disease with (acute) exacerbation: Secondary | ICD-10-CM

## 2022-03-29 DIAGNOSIS — Z20822 Contact with and (suspected) exposure to covid-19: Secondary | ICD-10-CM | POA: Diagnosis not present

## 2022-03-29 DIAGNOSIS — R9431 Abnormal electrocardiogram [ECG] [EKG]: Secondary | ICD-10-CM | POA: Diagnosis not present

## 2022-03-29 LAB — CBC WITH DIFFERENTIAL/PLATELET
Abs Immature Granulocytes: 0.01 10*3/uL (ref 0.00–0.07)
Basophils Absolute: 0.1 10*3/uL (ref 0.0–0.1)
Basophils Relative: 1 %
Eosinophils Absolute: 1.1 10*3/uL — ABNORMAL HIGH (ref 0.0–0.5)
Eosinophils Relative: 18 %
HCT: 40.7 % (ref 39.0–52.0)
Hemoglobin: 13.9 g/dL (ref 13.0–17.0)
Immature Granulocytes: 0 %
Lymphocytes Relative: 22 %
Lymphs Abs: 1.4 10*3/uL (ref 0.7–4.0)
MCH: 32 pg (ref 26.0–34.0)
MCHC: 34.2 g/dL (ref 30.0–36.0)
MCV: 93.8 fL (ref 80.0–100.0)
Monocytes Absolute: 0.7 10*3/uL (ref 0.1–1.0)
Monocytes Relative: 10 %
Neutro Abs: 3 10*3/uL (ref 1.7–7.7)
Neutrophils Relative %: 49 %
Platelets: 242 10*3/uL (ref 150–400)
RBC: 4.34 MIL/uL (ref 4.22–5.81)
RDW: 13.3 % (ref 11.5–15.5)
WBC: 6.2 10*3/uL (ref 4.0–10.5)
nRBC: 0 % (ref 0.0–0.2)

## 2022-03-29 LAB — BASIC METABOLIC PANEL
Anion gap: 5 (ref 5–15)
BUN: 15 mg/dL (ref 8–23)
CO2: 26 mmol/L (ref 22–32)
Calcium: 9.2 mg/dL (ref 8.9–10.3)
Chloride: 106 mmol/L (ref 98–111)
Creatinine, Ser: 0.89 mg/dL (ref 0.61–1.24)
GFR, Estimated: >60 mL/min (ref >60)
Glucose, Bld: 104 mg/dL — ABNORMAL HIGH (ref 70–99)
Potassium: 3.8 mmol/L (ref 3.5–5.1)
Sodium: 137 mmol/L (ref 135–145)

## 2022-03-29 LAB — RESP PANEL BY RT-PCR (FLU A&B, COVID) ARPGX2
Influenza A by PCR: NEGATIVE
Influenza B by PCR: NEGATIVE
SARS Coronavirus 2 by RT PCR: NEGATIVE

## 2022-03-29 MED ORDER — METHYLPREDNISOLONE SODIUM SUCC 125 MG IJ SOLR
125.0000 mg | Freq: Once | INTRAMUSCULAR | Status: AC
Start: 2022-03-29 — End: 2022-03-29
  Administered 2022-03-29: 125 mg via INTRAVENOUS
  Filled 2022-03-29: qty 2

## 2022-03-29 MED ORDER — IPRATROPIUM-ALBUTEROL 0.5-2.5 (3) MG/3ML IN SOLN
3.0000 mL | Freq: Once | RESPIRATORY_TRACT | Status: AC
Start: 2022-03-29 — End: 2022-03-29
  Administered 2022-03-29: 3 mL via RESPIRATORY_TRACT
  Filled 2022-03-29: qty 3

## 2022-03-29 MED ORDER — STERILE WATER FOR INJECTION IJ SOLN
INTRAMUSCULAR | Status: AC
  Filled 2022-03-29: qty 10

## 2022-03-29 MED ORDER — PREDNISONE 10 MG (21) PO TBPK: ORAL_TABLET | Freq: Every day | ORAL | 0 refills | Status: DC

## 2022-03-29 MED ORDER — IPRATROPIUM-ALBUTEROL 0.5-2.5 (3) MG/3ML IN SOLN
3.0000 mL | Freq: Once | RESPIRATORY_TRACT | Status: AC
  Administered 2022-03-29: 3 mL via RESPIRATORY_TRACT
  Filled 2022-03-29: qty 3

## 2022-03-29 NOTE — ED Triage Notes (Signed)
Pt reports increased SHOB over the past few days. Pt denies chest pain. Pt has hx of COPD and has a trach. Pt is audibly [facility] and wheezing in triage.

## 2022-03-29 NOTE — Progress Notes (Signed)
Pt has Shiley 6 cuffless trach with HME/Speaking valve.  Placed pt on 28% Trach collar for humidity, suctioned for small white thick secretions.  Duoneb given per MD order.

## 2022-03-29 NOTE — ED Provider Notes (Signed)
[facility] DEPT Provider Note   CSN: [id number] Arrival date & time: 03/29/22  7412     History  Chief Complaint  Patient presents with   Shortness of Breath    [name] [name] is a 71 y.o. male COPD, chronic respiratory failure, tracheostomy present, tacheal disorder, hypertension, BPH.    Presents emergency department with a complaint of shortness of breath.  Patient reports that he had shortness of breath over the last 2 days.  Patient states that he tried using nebulizer at home with water instead of albuterol with no improvement in his symptoms.  Patient states that after using nebulizer in this manner he had worsening of his shortness of breath.  Patient reports that he has been suctioning his trach without incident and has not noticed any change in mucus production.  Patient denies any fever, chills, chest pain, cough, hemoptysis, abdominal pain, nausea, vomiting, diarrhea, lightheadedness, syncope   Shortness of Breath Associated symptoms: no abdominal pain, no chest pain, no cough, no fever, no headaches, no neck pain, no rash and no vomiting        Home Medications Prior to Admission medications   Medication Sig Start Date End Date Taking? Authorizing Provider  acetaminophen 325 MG tablet Take 2 tablets (650 mg total) by mouth every 6 (six) hours as needed. Patient taking differently: Take 650 mg by mouth every 6 (six) hours as needed for pain. 12/28/19   Maczis, Barth Kirks, PA-C  albuterol (PROVENTIL) (2.5 MG/3ML) 0.083% nebulizer solution Take 3 mLs (2.5 mg total) by nebulization every 4 (four) hours as needed for wheezing or shortness of breath. 03/05/20   Tanda Rockers, MD  albuterol (VENTOLIN HFA) 108 (90 Base) MCG/ACT inhaler INHALE 2 PUFFS INTO THE LUNGS EVERY 6 HOURS AS NEEDED Patient taking differently: Inhale 2 puffs into the lungs every 6 (six) hours as needed for wheezing or shortness of breath. 08/05/20   Tanda Rockers, MD   carboxymethylcellulose (REFRESH PLUS) 0.5 % SOLN Apply to eye. 07/22/20   [provider]  cetirizine (ZYRTEC) 10 MG tablet Take 10 mg by mouth daily as needed for allergies.  04/10/19   [provider]  cyclobenzaprine (FLEXERIL) 10 MG tablet Take by mouth. 08/12/20   [provider]  finasteride (PROSCAR) 5 MG tablet Take 5 mg by mouth daily.    [provider]  fluticasone (FLONASE) 50 MCG/ACT nasal spray Place 2 sprays into both nostrils daily as needed for allergies.     [provider]  guaiFENesin (MUCINEX) 600 MG 12 hr tablet Take 1 tablet (600 mg total) by mouth 2 (two) times daily as needed for to loosen phlegm. Patient taking differently: Take 600 mg by mouth daily. 12/28/19   Maczis, Barth Kirks, PA-C  hydrochlorothiazide (HYDRODIURIL) 25 MG tablet Take 1 tablet (25 mg total) by mouth daily. 05/30/18   Lavina Hamman, MD  ibuprofen (ADVIL) 800 MG tablet Take 1 tablet (800 mg total) by mouth every 8 (eight) hours as needed. 05/24/20   Kinsinger, Arta Bruce, MD  losartan (COZAAR) 25 MG tablet Take 25 mg by mouth at bedtime. 01/17/21   [provider]  losartan (COZAAR) 50 MG tablet Take 50 mg by mouth daily.    [provider]  montelukast (SINGULAIR) 10 MG tablet Take 1 tablet (10 mg total) by mouth daily. 10/20/21   Tanda Rockers, MD  Multiple Vitamin (MULTIVITAMIN) tablet Take 1 tablet by mouth daily.    [provider]  mupirocin ointment (BACTROBAN) 2 % Apply topically. 10/21/20   [provider]  pantoprazole (PROTONIX) 40 MG tablet TAKE ONE TABLET BY MOUTH BEFORE BREAKFAST (30 MINUTES BEFORE) 03/11/22   Tanda Rockers, MD  sildenafil (VIAGRA) 100 MG tablet Take by mouth. 06/28/20   [provider]  sodium chloride (OCEAN) 0.65 % nasal spray Place into the nose. 10/21/20   [provider]  sodium chloride 0.9 % nebulizer solution Take 3-4 mLs by nebulization daily as needed. Use 3-4 drops into  Trach to loosen phlegm suction secretions immediatly after.  08/17/19   [provider]  sodium chloride 0.9 % nebulizer solution Inhale into the lungs. 10/21/20   [provider]  SYMBICORT 160-4.5 MCG/ACT inhaler INHALE TWO BY MOUTH PUFFS FIRST THING IN THE MORNING, THEN REPEAT 12 HOURS LATER 08/13/21   Tanda Rockers, MD  terazosin (HYTRIN) 5 MG capsule Take 5 mg by mouth at bedtime. 02/08/19   [provider]  terazosin (HYTRIN) 5 MG capsule Take 1 capsule by mouth at bedtime. 06/28/20   [provider]      Allergies    Patient has no known allergies.    Review of Systems   Review of Systems  Constitutional:  Negative for chills and fever.  Eyes:  Negative for visual disturbance.  Respiratory:  Positive for shortness of breath. Negative for cough.   Cardiovascular:  Negative for chest pain and leg swelling.  Gastrointestinal:  Negative for abdominal pain, nausea and vomiting.  Musculoskeletal:  Negative for back pain and neck pain.  Skin:  Negative for color change and rash.  Neurological:  Negative for dizziness, syncope, light-headedness and headaches.  Psychiatric/Behavioral:  Negative for confusion.     Physical Exam Updated Vital Signs BP 137/89 (BP Location: Left Arm)   Pulse 88   Temp 97.6 F (36.4 C) (Oral)   Resp 16   Ht '5\' 5"'$  (1.651 m)   Wt 77.1 kg   SpO2 97%   BMI 28.29 kg/m  Physical Exam Vitals and nursing note reviewed.  Constitutional:      General: He is not in acute distress.    Appearance: He is not ill-appearing, toxic-appearing or diaphoretic.  HENT:     Head: Normocephalic.  Eyes:     General: No scleral icterus.       Right eye: No discharge.        Left eye: No discharge.  Neck:     Comments: Tracheostomy tube in place; no excess secretions noted to tracheostomy tube Cardiovascular:     Rate and Rhythm: Normal rate.  Pulmonary:     Effort: Pulmonary effort is normal. No tachypnea, bradypnea or respiratory  distress.     Comments: Respiratory expiratory wheezing noted to all lung fields Musculoskeletal:     Right lower leg: No edema.     Left lower leg: No edema.  Skin:    General: Skin is warm and dry.  Neurological:     General: No focal deficit present.     Mental Status: He is alert.  Psychiatric:        Behavior: Behavior is cooperative.     ED Results / Procedures / Treatments   Labs (all labs ordered are listed, but only abnormal results are displayed) Labs Reviewed  BASIC METABOLIC PANEL - Abnormal; Notable for the following components:      Result Value   Glucose, Bld 104 (*)    All other components within normal limits  CBC  WITH DIFFERENTIAL/PLATELET - Abnormal; Notable for the following components:   Eosinophils Absolute 1.1 (*)    All other components within normal limits  RESP PANEL BY RT-PCR (FLU A&B, COVID) [id number]    EKG EKG Interpretation  Date/Time:  Sunday March 29 2022 09:55:01 EDT Ventricular Rate:  83 PR Interval:  180 QRS Duration: 87 QT Interval:  354 QTC Calculation: 416 R Axis:   66 Text Interpretation: Sinus rhythm Left atrial enlargement Anterior infarct, old Confirmed by Lacretia Leigh ([id number]) on 03/29/2022 11:43:27 AM  Radiology DG Chest 2 View  Result Date: 03/29/2022 CLINICAL DATA:  Acute shortness of breath EXAM: CHEST - 2 VIEW COMPARISON:  01/31/2022 and prior studies FINDINGS: The cardiomediastinal silhouette is unremarkable. Tracheostomy tube again noted. Mild peribronchial thickening LEFT basilar atelectasis/scarring again identified. There is no evidence of focal airspace disease, pulmonary edema, suspicious pulmonary nodule/mass, pleural effusion, or pneumothorax. No acute bony abnormalities are identified. Remote LEFT rib and clavicle fractures again identified. IMPRESSION: No evidence of acute cardiopulmonary disease. Electronically Signed   By: Margarette Canada M.D.   On: 03/29/2022 10:32    Procedures Procedures    Medications Ordered  in ED Medications - No data to display  ED Course/ Medical Decision Making/ A&P                           Medical Decision Making Amount and/or Complexity of Data Reviewed Labs: ordered. Radiology: ordered.  Risk Prescription drug management.   Alert 71 year old male in no acute distress, nontoxic-appearing.  Presents emergency department complaint of shortness of breath.  Information obtained from patient patient's wife at bedside.  I reviewed patient's past medical records including previous provider notes, labs, imaging imaging.  Patient's medical history as outlined in HPI which complicates his care.  Patient noted to have inspiratory and expiratory wheezing noted to all lung fields.  We will give patient DuoNeb and Solu-Medrol.  Will obtain COVID-19 testing and chest x-ray.  We will hold any lab testing at this time.   I personally viewed and interpreted patient's EKG.  Tracing shows sinus rhythm with no significant change from previous tracing.  I personally viewed and interpreted chest x-ray.  Imaging shows no active cardiopulmonary disease.  I personally viewed and interpreted patient's lab results.  Pertinent findings include: -BMP and CBC unremarkable -COVID-19 testing pending  Patient reports marked improvement in his shortness of breath after receiving DuoNeb nebs and Solu-Medrol.  Patient does have minimal expiratory wheezing however suspect this is baseline with patient COPD.  We will discharge patient at this time with course of prednisone.  Patient to follow-up with pulmonologist for repeat evaluation.  Patient was informed of accessing COVID results on MyChart.  Patient to follow-up with PCP if test results are positive.  Patient was discussed with and evaluated by Dr. Zenia Resides.  Based on patient's chief complaint, I considered admission might be necessary, however after reassuring ED workup feel patient is reasonable for discharge.  Discussed results, findings,  treatment and follow up. Patient advised of return precautions. Patient verbalized understanding and agreed with plan.  Portions of this note were generated with L[employer]. Dictation errors may occur despite best attempts at proofreading.          Final Clinical Impression(s) / ED Diagnoses Final diagnoses:  COPD exacerbation ([place])    Rx / DC Orders ED Discharge Orders          Ordered    predniSONE (  STERAPRED UNI-PAK 21 TAB) 10 MG (21) TBPK tablet  Daily        03/29/22 1233              Dyann Ruddle 03/29/22 1237    Lacretia Leigh, MD 03/30/22 1428

## 2022-03-29 NOTE — Discharge Instructions (Signed)
[facility] to be evaluated for your shortness of breath.  Your chest x-ray did not show any abnormalities.  You had significant wheezing on your exam which improved after receiving albuterol and steroids.  I am sending you home with a prescription for steroids.  Please follow-up with your pulmonologist for further evaluation.  I have given you a prescription for steroids today.  Some common side effects include feelings of extra energy, feeling warm, increased appetite, and stomach upset.  If you are diabetic your sugars may run higher than usual.    You have a COVID test pending.  He can find results on cardiac monitor.  Please isolate at home while awaiting your results.  > If your test is negative, stay home until your fever has resolved/your symptoms are improving. > If your test is positive, isolate at home for at least 5 days after the day your symptoms initially began, and THEN at least 24 hours after you are fever-free without the help of medications (Tylenol/acetaminophen and Advil/ibuprofen/Motrin) AND your symptoms are improving.  Additionally if you are positive for COVID-19 please contact your primary care doctor as you may qualify for antiviral medication.  Get help right away if: You have worsening shortness of breath, even when resting. You have trouble talking. You have severe chest pain. You cough up blood. You have a fever. You have weakness, vomit repeatedly, or faint. You feel confused. You are not able to sleep because of your symptoms. You have trouble doing daily activities.

## 2022-03-29 NOTE — ED Notes (Signed)
RT at bedside evaluating and treating patient

## 2022-03-29 NOTE — ED Provider Notes (Signed)
I provided a substantive portion of the care of this patient.  I personally performed the entirety of the medical decision making for this encounter.  EKG Interpretation  Date/Time:  Sunday March 29 2022 09:55:01 EDT Ventricular Rate:  83 PR Interval:  180 QRS Duration: 87 QT Interval:  354 QTC Calculation: 416 R Axis:   66 Text Interpretation: Sinus rhythm Left atrial enlargement Anterior infarct, old Confirmed by Lacretia Leigh [phone]) on 03/29/2022 11:43:46 AM    71 year old male presents with shortness of breath history of COPD.  His had issues with his trach with mucus.  Had wheezing initially improved after albuterol.  His EKG per my interpretation shows no acute ischemic changes.  Plan will be to give a another dose of albuterol and likely discharge home.   Lacretia Leigh, MD 03/29/22 1144

## 2022-04-01 DIAGNOSIS — E785 Hyperlipidemia, unspecified: Secondary | ICD-10-CM | POA: Diagnosis not present

## 2022-04-01 DIAGNOSIS — I10 Essential (primary) hypertension: Secondary | ICD-10-CM | POA: Diagnosis not present

## 2022-04-01 DIAGNOSIS — K219 Gastro-esophageal reflux disease without esophagitis: Secondary | ICD-10-CM | POA: Diagnosis not present

## 2022-04-01 DIAGNOSIS — J449 Chronic obstructive pulmonary disease, unspecified: Secondary | ICD-10-CM | POA: Diagnosis not present

## 2022-04-03 ENCOUNTER — Encounter: Payer: Self-pay | Admitting: Primary Care

## 2022-04-03 ENCOUNTER — Ambulatory Visit (INDEPENDENT_AMBULATORY_CARE_PROVIDER_SITE_OTHER): Payer: Medicare Other | Admitting: Primary Care

## 2022-04-03 DIAGNOSIS — A498 Other bacterial infections of unspecified site: Secondary | ICD-10-CM

## 2022-04-03 DIAGNOSIS — J4531 Mild persistent asthma with (acute) exacerbation: Secondary | ICD-10-CM | POA: Diagnosis not present

## 2022-04-03 DIAGNOSIS — Z930 Tracheostomy status: Secondary | ICD-10-CM
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
## 2022-04-03 NOTE — Patient Instructions (Signed)
Recommendations: - Continue prednisone taper until complete - Continue to use Symbicort 160 mcg 2 puffs morning and evening (rinse mouth after use) - Continue Mucinex 600 mg twice daily as needed to loosen phlegm - Continue hypertonic saline nebulizer as needed to loosen phlegm - Continue Singulair 10 mg at bedtime - Increased humidification on O2 to 50% (can try going up to 70% if no improvement). Let us know if this does not help   Follow-up: - 3 months with Dr. Melvyn Novas      Tracheostomy  A tracheostomy is a procedure to create an opening in the windpipe (trachea). A breathing tube, called a trach tube,is placed in this opening. This allows you to breathe without using your nose or mouth. A tracheostomy may be permanent or temporary. It may be planned and performed in an operating room, or it may be an emergency procedure performed at your hospital bedside or in an emergency room. You may need to have a tracheostomy if: You need long-term help with breathing. Your airway is blocked by: Swelling. Injury. Tumor. A foreign object. A vocal cord problem. Severe narrowing of the trachea. You have too much mucus or other fluids (secretions) in your airway that need to be suctioned often. Tell a health care provider about: Any allergies you have. All medicines you are taking, including vitamins, herbs, eye drops, creams, and over-the-counter medicines. Any problems you or family members have had with anesthetic medicines. Any blood disorders you have. Any surgeries you have had. Any medical conditions you have. Whether you are pregnant or may be pregnant. What are the risks? Generally, this is a safe procedure. However, problems may occur, including: Infection. Bleeding. Puncture of one or both lungs (pneumothorax). Narrowing of the trachea, making it hard to breathe. Damage to the thyroid gland, which is in the lower front part of the neck. What happens before the procedure? Follow  instructions from your health care provider about eating or drinking restrictions. Ask your health care provider about: Changing or stopping your regular medicines. This is especially important if you are taking diabetes medicines or blood thinners. Taking medicines such as aspirin and ibuprofen. These medicines can thin your blood. Do not take these medicines unless your health care provider tells you to take them. Taking over-the-counter medicines, vitamins, herbs, and supplements. Plan to have someone take you home from the hospital or clinic. Plan to have a responsible adult care for you for at least 24 hours after you leave the hospital or clinic. This is important. What happens during the procedure? An IV will be inserted into one of your veins. You will be given one of the following: A medicine to numb the area (local anesthetic). A medicine to help you relax (sedative). A medicine to make you fall asleep (general anesthetic). The front of your neck will be cleaned with a germ-killing solution. An incision will be made at the base of your neck, below the Adam's apple. Muscles in your neck will be separated to reach the front of your trachea. An incision or a small hole will be made in the front wall of your trachea. A trach tube will be put through the opening. A thin, flexible tool with a camera on it (bronchoscope) may be used to make sure the trach tube is in the right place. A balloon or cuff will be inflated. When the cuff is inflated, the air you breathe will flow in and out of the trach tube, and not through your mouth  and nose. The trach tube will be tied or stitched in place. The incision in your neck will be covered with a bandage (dressing). The procedure may vary among health care providers and hospitals. What happens after the procedure? Your blood pressure, heart rate, breathing rate, and blood oxygen level will be monitored until you leave the hospital or clinic. If you  need help to breathe, your trach tube will be connected to a breathing machine (ventilator). Your trach tube may be suctioned and cleaned frequently. You and your caregivers will learn how to care for your trach tube and stoma, including how to: Clean your tracheostomy opening. Clean or change the tube. Communicate. Safely suction secretions. Protect your lungs. If you were given a sedative during the procedure, it can affect you for several hours. Do not drive or operate machinery until your health care provider says that it is safe. Summary A tracheostomy is a procedure to create an opening in the windpipe (trachea) to allow you to breathe easier. A tracheostomy may be permanent or temporary. You may need to have a trach tube if you need long-term breathing assistance. You will be taught how to care for your tracheostomy by your healthcare providers. This information is not intended to replace advice given to you by your health care provider. Make sure you discuss any questions you have with your health care provider. Document Revised: 03/12/2020 Document Reviewed: 03/12/2020 Elsevier Patient Education  [employer].

## 2022-04-03 NOTE — Assessment & Plan Note (Signed)
-   Clinically improved; Completed cipro course in May. Rare cough without purulent mucus. No fevers.

## 2022-04-03 NOTE — Assessment & Plan Note (Addendum)
-   Treated for acute exacerbation in May and again in July. Currently on prednisone. Eosinophile absolute 1,100 on 03/29/22. Respiratory symptoms improving. Intermittent wheezing and occasional productive cough without purulent mucus. If continues to have recurrent flares may benefit from biologics vs daily prednisone. Continue Symbicort 160 two puffs twice daily, Singulair '10mg'$  at bedtime, prn albuterol and hypertonic saline. FU in 3 months with Dr. Melvyn Novas or sooner if needed.

## 2022-04-07 DIAGNOSIS — Z430 Encounter for attention to tracheostomy: Secondary | ICD-10-CM | POA: Diagnosis not present

## 2022-04-17 ENCOUNTER — Other Ambulatory Visit: Payer: Self-pay | Admitting: *Deleted

## 2022-04-17 MED ORDER — MONTELUKAST SODIUM 10 MG PO TABS: 10.0000 mg | ORAL_TABLET | Freq: Every day | ORAL | 5 refills | Status: DC

## 2022-05-04 ENCOUNTER — Telehealth: Payer: Self-pay | Admitting: Internal Medicine

## 2022-05-04 NOTE — Telephone Encounter (Signed)
Attempted to call Katie from [place] but unable to reach. Left message for her to return call.

## 2022-05-05 NOTE — Telephone Encounter (Signed)
Attempted to call [facility] to speak to [facility] about mutual pt but unable to reach. Unable to leave VM. Will try to call back later.

## 2022-05-06 ENCOUNTER — Telehealth: Payer: Self-pay | Admitting: Primary Care

## 2022-05-06 NOTE — Telephone Encounter (Signed)
ATC pt to schedule appt to further evaluate his symptoms to see if he does need prednisone or abx but unable to reach. Left detailed message for pt to return call to schedule appt. Due to multiple attempts trying to reach either pt or [facility] but unable to do so, per protocol encounter will be closed.

## 2022-05-06 NOTE — Telephone Encounter (Signed)
Called and spoke with pt and have scheduled an appt for him with [facility] 8/25. Nothing further needed.

## 2022-05-07 ENCOUNTER — Other Ambulatory Visit: Payer: Self-pay | Admitting: Internal Medicine

## 2022-05-07 DIAGNOSIS — Z430 Encounter for attention to tracheostomy: Secondary | ICD-10-CM | POA: Diagnosis not present

## 2022-05-08 ENCOUNTER — Encounter: Payer: Self-pay | Admitting: Internal Medicine

## 2022-05-08 ENCOUNTER — Ambulatory Visit (INDEPENDENT_AMBULATORY_CARE_PROVIDER_SITE_OTHER): Payer: Medicare Other | Admitting: Internal Medicine

## 2022-05-08 DIAGNOSIS — J4531 Mild persistent asthma with (acute) exacerbation: Secondary | ICD-10-CM | POA: Diagnosis not present

## 2022-05-08 DIAGNOSIS — R058 Other specified cough: Secondary | ICD-10-CM | POA: Diagnosis not present

## 2022-05-08 MED ORDER — PREDNISONE 10 MG PO TABS: ORAL_TABLET | ORAL | 2 refills | Status: DC

## 2022-05-08 NOTE — Patient Instructions (Signed)
Prednisone 10 mg x 2 until better then 1 daily x one daily with breakfast then 1/2 daily  - if worse go back to previous dose   Please schedule a follow up visit in 3 months but call sooner if needed
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
## 2022-05-09 ENCOUNTER — Encounter: Payer: Self-pay | Admitting: Internal Medicine

## 2022-05-09 NOTE — Assessment & Plan Note (Addendum)
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  >>> rec keep up with ENT appts, consider humdified collar .         Each maintenance medication was reviewed in detail including emphasizing most importantly the difference between maintenance and prns and under what circumstances the prns are to be triggered using an action plan format where appropriate.  Total time for H and P, chart review, counseling, reviewing hfa device(s) and generating customized AVS unique to this office visit / same day charting = 22 min

## 2022-05-09 NOTE — Assessment & Plan Note (Signed)
Onset p trach placed in 2017 10/30/2016    try reduce symb to 80 2bid  - 01/21/2017  After extensive coaching HFA effectiveness =    75% via adaptor to trach  - Spirometry 12/14/2017  FEV1 1.59 (66%)  Ratio 65 after symb with poor hfa  - 12/14/2017  After extensive coaching inhaler device  effectiveness =    75% from a baseline of 25 % > continue symb 160 2bid  - PFT's  01/14/2018  FEV1 1.32 (51 % ) ratio 63  p 3 % improvement from saba p ? prior to study with DLCO  58/61 % corrects to 118  % for alv volume - 01/14/2018  After extensive coaching inhaler device  effectiveness =    75% try change to bevespi and floor dose of pred @ 10 mg daily > did not do - on singulair as of ov 07/14/2018  - Spirometry 07/14/2018  FEV1 1.2  (48%)  Ratio 61  - 07/14/2018   change from bevespi to symbicort 160 2bid   - 02/28/2019  After extensive coaching inhaler device,  effectiveness =    0% (no adequate insp flow) so rec all inhalers, nebs via trach    The goal with a chronic steroid dependent illness is always arriving at the lowest effective dose that controls the disease/symptoms and not accepting a set "formula" which is based on statistics or guidelines that don't always take into account patient  variability or the natural hx of the dz in every individual patient, which may well vary over time.  For now therefore I recommend the patient maintain  20 mg ceiling and 5 mg floor and f/u with me q 3 m  - if can't keep dose lower could try performist/budesonid

## 2022-05-14 DIAGNOSIS — Z930 Tracheostomy status: Secondary | ICD-10-CM | POA: Diagnosis not present

## 2022-05-14 DIAGNOSIS — E782 Mixed hyperlipidemia: Secondary | ICD-10-CM | POA: Diagnosis not present

## 2022-05-14 DIAGNOSIS — I10 Essential (primary) hypertension: Secondary | ICD-10-CM | POA: Diagnosis not present

## 2022-05-14 DIAGNOSIS — J339 Nasal polyp, unspecified: Secondary | ICD-10-CM | POA: Diagnosis not present

## 2022-05-14 DIAGNOSIS — K219 Gastro-esophageal reflux disease without esophagitis: Secondary | ICD-10-CM | POA: Diagnosis not present

## 2022-05-14 DIAGNOSIS — J449 Chronic obstructive pulmonary disease, unspecified: Secondary | ICD-10-CM | POA: Diagnosis not present

## 2022-05-14 DIAGNOSIS — J324 Chronic pansinusitis: Secondary | ICD-10-CM | POA: Diagnosis not present

## 2022-05-28 DIAGNOSIS — K219 Gastro-esophageal reflux disease without esophagitis: Secondary | ICD-10-CM | POA: Diagnosis not present

## 2022-05-28 DIAGNOSIS — J449 Chronic obstructive pulmonary disease, unspecified: Secondary | ICD-10-CM | POA: Diagnosis not present

## 2022-05-28 DIAGNOSIS — I10 Essential (primary) hypertension: Secondary | ICD-10-CM | POA: Diagnosis not present

## 2022-05-28 DIAGNOSIS — E785 Hyperlipidemia, unspecified: Secondary | ICD-10-CM | POA: Diagnosis not present

## 2022-06-03 ENCOUNTER — Telehealth: Payer: Self-pay | Admitting: Internal Medicine

## 2022-06-03 DIAGNOSIS — Z930 Tracheostomy status: Secondary | ICD-10-CM | POA: Diagnosis not present

## 2022-06-03 NOTE — Telephone Encounter (Signed)
All trach care should be ordered by his trach doctor ? Still at [facility] but if he can't get them end of week we can order them form him

## 2022-06-03 NOTE — Telephone Encounter (Signed)
Patient is calling to request some medical supplies for trach in her neck.  She stated that she was told to call the doctor to get the order.  Please call patient to discuss further at [phone]

## 2022-06-03 NOTE — Telephone Encounter (Signed)
Attempted to call pt but unable to reach. Left message for him to return call. °

## 2022-06-03 NOTE — Telephone Encounter (Signed)
Patient is asking for supplies for trach. Pt uses [facility] it looks like. Pt was last seen in 04/2022.   Sir are you ok with new order for trach supplies  Please advise

## 2022-06-08 DIAGNOSIS — Z430 Encounter for attention to tracheostomy: Secondary | ICD-10-CM | POA: Diagnosis not present

## 2022-06-23 DIAGNOSIS — R0982 Postnasal drip: Secondary | ICD-10-CM | POA: Diagnosis not present

## 2022-06-23 DIAGNOSIS — Z9889 Other specified postprocedural states: Secondary | ICD-10-CM | POA: Diagnosis not present

## 2022-06-23 DIAGNOSIS — Z930 Tracheostomy status: Secondary | ICD-10-CM | POA: Diagnosis not present

## 2022-07-01 DIAGNOSIS — J449 Chronic obstructive pulmonary disease, unspecified: Secondary | ICD-10-CM | POA: Diagnosis not present

## 2022-07-01 DIAGNOSIS — K219 Gastro-esophageal reflux disease without esophagitis: Secondary | ICD-10-CM | POA: Diagnosis not present

## 2022-07-01 DIAGNOSIS — I10 Essential (primary) hypertension: Secondary | ICD-10-CM | POA: Diagnosis not present

## 2022-07-01 DIAGNOSIS — E785 Hyperlipidemia, unspecified: Secondary | ICD-10-CM | POA: Diagnosis not present

## 2022-07-02 ENCOUNTER — Telehealth: Payer: Self-pay | Admitting: Internal Medicine

## 2022-07-02 NOTE — Telephone Encounter (Signed)
Not able to get thru message machine but no I have no specific advise for CAT score of 21 in this pt with lots of upper airway wheeze on a good day s/p trach

## 2022-07-02 NOTE — Telephone Encounter (Signed)
Routing to Dr. Wert as an FYI. 

## 2022-07-13 ENCOUNTER — Ambulatory Visit: Payer: Medicare Other | Admitting: Internal Medicine

## 2022-07-15 DIAGNOSIS — Z430 Encounter for attention to tracheostomy: Secondary | ICD-10-CM | POA: Diagnosis not present

## 2022-07-22 DIAGNOSIS — Z930 Tracheostomy status: Secondary | ICD-10-CM | POA: Diagnosis not present

## 2022-07-22 DIAGNOSIS — Z9889 Other specified postprocedural states: Secondary | ICD-10-CM | POA: Diagnosis not present

## 2022-07-22 DIAGNOSIS — J324 Chronic pansinusitis: Secondary | ICD-10-CM | POA: Diagnosis not present

## 2022-07-23 DIAGNOSIS — J4530 Mild persistent asthma, uncomplicated: Secondary | ICD-10-CM | POA: Diagnosis not present

## 2022-07-23 DIAGNOSIS — J449 Chronic obstructive pulmonary disease, unspecified: Secondary | ICD-10-CM | POA: Diagnosis not present

## 2022-07-23 DIAGNOSIS — I10 Essential (primary) hypertension: Secondary | ICD-10-CM | POA: Diagnosis not present

## 2022-07-23 DIAGNOSIS — R7303 Prediabetes: Secondary | ICD-10-CM | POA: Diagnosis not present

## 2022-07-23 DIAGNOSIS — E7800 Pure hypercholesterolemia, unspecified: Secondary | ICD-10-CM | POA: Diagnosis not present

## 2022-07-28 ENCOUNTER — Ambulatory Visit (INDEPENDENT_AMBULATORY_CARE_PROVIDER_SITE_OTHER): Payer: Medicare Other | Admitting: Internal Medicine

## 2022-07-28 ENCOUNTER — Encounter: Payer: Self-pay | Admitting: Internal Medicine

## 2022-07-28 VITALS — BP 116/70 | HR 74 | Temp 97.7°F | Ht 65.0 in | Wt 168.0 lb

## 2022-07-28 DIAGNOSIS — J4531 Mild persistent asthma with (acute) exacerbation: Secondary | ICD-10-CM | POA: Diagnosis not present

## 2022-07-28 MED ORDER — ALBUTEROL SULFATE (2.5 MG/3ML) 0.083% IN NEBU: 2.5000 mg | INHALATION_SOLUTION | RESPIRATORY_TRACT | 5 refills | Status: DC | PRN

## 2022-07-28 NOTE — Patient Instructions (Addendum)
Work on inhaler technique:  relax and gently blow all the way out then take a nice smooth full deep breath back in, triggering the inhaler at same time you start breathing in.  Hold breath in for at least  5 seconds if you can. Rinse and gargle with water when done.  If mouth or throat bother you at all,  try brushing teeth/gums/tongue with arm and hammer toothpaste/ make a slurry and gargle and spit out.    Plan A = Automatic = Always=    Symbicort  160 Take 2 puffs first thing in am and then another 2 puffs about 12 hours later.    Work on inhaler technique:  relax and gently blow all the way out then take a nice smooth full deep breath back in, triggering the inhaler at same time you start breathing in.  Hold breath in for at least  5 seconds if you can. .     Plan B = Backup (to supplement plan A, not to replace it) Only use your albuterol inhaler as a rescue medication to be used if you can't catch your breath by resting or doing a relaxed purse lip breathing pattern.  - The less you use it, the better it will work when you need it. - Ok to use the inhaler up to 2 puffs  every 4 hours if you must but call for appointment if use goes up over your usual need - Don't leave home without it !!  (think of it like the spare tire for your car)   Plan C = Crisis (instead of Plan B but only if Plan B stops working) - only use your albuterol nebulizer if you first try Plan B and it fails to help > ok to use the nebulizer up to every 4 hours but if start needing it regularly call for immediate appointment    Prednisone 10 mg 2 until better and then 1 daily   Please schedule a follow up office visit in 6 weeks, call sooner if needed bring your inhalers and nebulizer solution with you
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
## 2022-07-28 NOTE — Assessment & Plan Note (Addendum)
Onset p trach placed in 2017 10/30/2016    try reduce symb to 80 2bid  - 01/21/2017  After extensive coaching HFA effectiveness =    75% via adaptor to trach  - Spirometry 12/14/2017  FEV1 1.59 (66%)  Ratio 65 after symb with poor hfa  - 12/14/2017  After extensive coaching inhaler device  effectiveness =    75% from a baseline of 25 % > continue symb 160 2bid  - PFT's  01/14/2018  FEV1 1.32 (51 % ) ratio 63  p 3 % improvement from saba p ? prior to study with DLCO  58/61 % corrects to 118  % for alv volume - 01/14/2018  After extensive coaching inhaler device  effectiveness =    75% try change to bevespi and floor dose of pred @ 10 mg daily > did not do - on singulair as of ov 07/14/2018  - Spirometry 07/14/2018  FEV1 1.2  (48%)  Ratio 61  - 07/14/2018   change from bevespi to symbicort 160 2bid   - 02/28/2019  After extensive coaching inhaler device,  effectiveness =    0% (no adequate insp flow) so rec all inhalers, nebs via trach     >>>  07/28/2022  After extensive coaching inhaler device,  effectiveness =   50% with use of trach/mdi's so ok to use symbicort 160 and prn saba hfa but should have neb albuterol as plan  C and prednisone to back that up:  20 mg until better with trach out and then drop to 10 mg. If only having trouble with speaking valve in  explained this is a tnot a lung issue and should be addressed by ent/ relayed to daughter by  phone          Each maintenance medication was reviewed in detail including emphasizing most importantly the difference between maintenance and prns and under what circumstances the prns are to be triggered using an action plan format where appropriate.  Total time for H and P, chart review, counseling, reviewing hfa/neb/ speaking valve device(s) and generating customized AVS unique to this office visit / same day charting = > 30 min

## 2022-08-14 DIAGNOSIS — Z430 Encounter for attention to tracheostomy: Secondary | ICD-10-CM | POA: Diagnosis not present

## 2022-08-25 DIAGNOSIS — J324 Chronic pansinusitis: Secondary | ICD-10-CM | POA: Diagnosis not present

## 2022-08-25 DIAGNOSIS — Z930 Tracheostomy status: Secondary | ICD-10-CM | POA: Diagnosis not present

## 2022-08-28 DIAGNOSIS — M792 Neuralgia and neuritis, unspecified: Secondary | ICD-10-CM | POA: Diagnosis not present

## 2022-08-28 DIAGNOSIS — L84 Corns and callosities: Secondary | ICD-10-CM | POA: Diagnosis not present

## 2022-08-28 DIAGNOSIS — I739 Peripheral vascular disease, unspecified: Secondary | ICD-10-CM | POA: Diagnosis not present

## 2022-09-02 DIAGNOSIS — Z930 Tracheostomy status: Secondary | ICD-10-CM | POA: Diagnosis not present

## 2022-09-11 DIAGNOSIS — I739 Peripheral vascular disease, unspecified: Secondary | ICD-10-CM | POA: Diagnosis not present

## 2022-09-11 DIAGNOSIS — M792 Neuralgia and neuritis, unspecified: Secondary | ICD-10-CM | POA: Diagnosis not present

## 2022-09-17 DIAGNOSIS — Z430 Encounter for attention to tracheostomy: Secondary | ICD-10-CM | POA: Diagnosis not present

## 2022-09-18 DIAGNOSIS — Q667 Congenital pes cavus, unspecified foot: Secondary | ICD-10-CM | POA: Diagnosis not present

## 2022-09-22 ENCOUNTER — Encounter: Payer: Self-pay | Admitting: Internal Medicine

## 2022-09-22 ENCOUNTER — Ambulatory Visit (INDEPENDENT_AMBULATORY_CARE_PROVIDER_SITE_OTHER): Payer: Medicare Other | Admitting: Internal Medicine

## 2022-09-22 VITALS — BP 130/80 | HR 79 | Temp 98.0°F | Ht 65.0 in | Wt 172.6 lb

## 2022-09-22 DIAGNOSIS — R058 Other specified cough: Secondary | ICD-10-CM | POA: Diagnosis not present

## 2022-09-22 DIAGNOSIS — R0609 Other forms of dyspnea: Secondary | ICD-10-CM | POA: Diagnosis not present

## 2022-09-22 NOTE — Patient Instructions (Signed)
No change medications    Please schedule a follow up visit in 6 months but call sooner if needed

## 2022-09-22 NOTE — Assessment & Plan Note (Signed)
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
## 2022-09-22 NOTE — Assessment & Plan Note (Signed)
07/14/2018  Walked RA x 3 laps @ 185 ft each stopped due to  End of study, nl pace, no sob or desats once the pmv was removed p the first lap    Continuing to treat as AB > no change in rx needed     F/u in 6 m, sooner prn       Each maintenance medication was reviewed in detail including emphasizing most importantly the difference between maintenance and prns and under what circumstances the prns are to be triggered using an action plan format where appropriate.  Total time for H and P, chart review, counseling, reviewing hfa/neb device(s) and generating customized AVS unique to this office visit / same day charting = 23 min

## 2022-09-24 DIAGNOSIS — Z01818 Encounter for other preprocedural examination: Secondary | ICD-10-CM | POA: Diagnosis not present

## 2022-09-24 DIAGNOSIS — Z930 Tracheostomy status: Secondary | ICD-10-CM | POA: Diagnosis not present

## 2022-09-24 DIAGNOSIS — R7303 Prediabetes: Secondary | ICD-10-CM | POA: Diagnosis not present

## 2022-09-24 DIAGNOSIS — Q667 Congenital pes cavus, unspecified foot: Secondary | ICD-10-CM | POA: Diagnosis not present

## 2022-09-24 DIAGNOSIS — J4530 Mild persistent asthma, uncomplicated: Secondary | ICD-10-CM | POA: Diagnosis not present

## 2022-10-16 DIAGNOSIS — Z9889 Other specified postprocedural states: Secondary | ICD-10-CM | POA: Diagnosis not present

## 2022-10-16 DIAGNOSIS — J324 Chronic pansinusitis: Secondary | ICD-10-CM | POA: Diagnosis not present

## 2022-10-16 DIAGNOSIS — Z930 Tracheostomy status: Secondary | ICD-10-CM | POA: Diagnosis not present

## 2022-10-16 DIAGNOSIS — Z430 Encounter for attention to tracheostomy: Secondary | ICD-10-CM | POA: Diagnosis not present

## 2022-10-19 NOTE — Progress Notes (Signed)
Reviewed pt chart for surgery scheduled for 10-30-2022 by Dr Waldron Session '@[email or web address]'$ .  Pt has tracheostomy .  Pt is not a surgery center candidate per anesthesia guidelines.  Called and spoke w/ Dr Julieanne Manson office informed them would need to be moved to main OR due to tracheostomy.

## 2022-10-21 NOTE — Progress Notes (Addendum)
Pt. Needs orders for surgery.Also H & P.

## 2022-10-21 NOTE — Patient Instructions (Signed)
DUE TO COVID-19 ONLY TWO VISITORS  (aged 72 and older)  ARE ALLOWED TO COME WITH YOU AND STAY IN THE WAITING ROOM ONLY DURING PRE OP AND PROCEDURE.   **NO VISITORS ARE ALLOWED IN THE SHORT STAY AREA OR RECOVERY ROOM!!**  IF YOU WILL BE ADMITTED INTO THE HOSPITAL YOU ARE ALLOWED ONLY FOUR SUPPORT PEOPLE DURING VISITATION HOURS ONLY (7 AM -8PM)   The support person(s) must pass our screening, gel in and out, and wear a mask at all times, including in the patient's room. Patients must also wear a mask when staff or their support person are in the room. Visitors GUEST BADGE MUST BE WORN VISIBLY  One adult visitor may remain with you overnight and MUST be in the room by 8 P.M.     Your procedure is scheduled on: 10/30/22   Report to [facility] Main Entrance    Report to admitting at : 11:00 AM   Call this number if you have problems the morning of surgery [phone]   Do not eat food :After Midnight.   After Midnight you may have the following liquids until: 9:00 AM DAY OF SURGERY  Water Black Coffee (sugar ok, NO MILK/CREAM OR CREAMERS)  Tea (sugar ok, NO MILK/CREAM OR CREAMERS) regular and decaf                             Plain Jell-O (NO RED)                                           Fruit ices (not with fruit pulp, NO RED)                                     Popsicles (NO RED)                                                                  Juice: apple, WHITE grape, WHITE cranberry Sports drinks like Gatorade (NO RED)    Oral Hygiene is also important to reduce your risk of infection.                                    Remember - BRUSH YOUR TEETH THE MORNING OF SURGERY WITH YOUR REGULAR TOOTHPASTE  DENTURES WILL BE REMOVED PRIOR TO SURGERY PLEASE DO NOT APPLY "Poly grip" OR ADHESIVES!!!   Do NOT smoke after Midnight   Take these medicines the morning of surgery with A SIP OF WATER: finasteride,pantoprazole,singular.Tylenol,cetirizine as needed.Use inhalers as  usual.                              You may not have any metal on your body including hair pins, jewelry, and body piercing             Do not wear lotions, powders, perfumes/cologne, or deodorant  Men may shave face and neck.   Do not bring valuables to the hospital. Plain.   Contacts, glasses, or bridgework may not be worn into surgery.   Bring small overnight bag day of surgery.   DO NOT [place]. PHARMACY WILL DISPENSE MEDICATIONS LISTED ON YOUR MEDICATION LIST TO YOU DURING YOUR ADMISSION [place]!    Patients discharged on the day of surgery will not be allowed to drive home.  Someone NEEDS to stay with you for the first 24 hours after anesthesia.   Special Instructions: Bring a copy of your healthcare power of attorney and living will documents         the day of surgery if you haven't scanned them before.              Please read over the following fact sheets you were given: IF YOU HAVE QUESTIONS ABOUT YOUR PRE-OP INSTRUCTIONS PLEASE CALL [phone]    [facility] - Preparing for Surgery Before surgery, you can play an important role.  Because skin is not sterile, your skin needs to be as free of germs as possible.  You can reduce the number of germs on your skin by washing with CHG (chlorahexidine gluconate) soap before surgery.  CHG is an antiseptic cleaner which kills germs and bonds with the skin to continue killing germs even after washing. Please DO NOT use if you have an allergy to CHG or antibacterial soaps.  If your skin becomes reddened/irritated stop using the CHG and inform your nurse when you arrive at Short Stay. Do not shave (including legs and underarms) for at least 48 hours prior to the first CHG shower.  You may shave your face/neck. Please follow these instructions carefully:  1.  Shower with CHG Soap the night before surgery and the  morning of  Surgery.  2.  If you choose to wash your hair, wash your hair first as usual with your  normal  shampoo.  3.  After you shampoo, rinse your hair and body thoroughly to remove the  shampoo.                           4.  Use CHG as you would any other liquid soap.  You can apply chg directly  to the skin and wash                       Gently with a scrungie or clean washcloth.  5.  Apply the CHG Soap to your body ONLY FROM THE NECK DOWN.   Do not use on face/ open                           Wound or open sores. Avoid contact with eyes, ears mouth and genitals (private parts).                       Wash face,  Genitals (private parts) with your normal soap.             6.  Wash thoroughly, paying special attention to the area where your surgery  will be performed.  7.  Thoroughly rinse your body with warm water from the neck down.  8.  DO  NOT shower/wash with your normal soap after using and rinsing off  the CHG Soap.                9.  Pat yourself dry with a clean towel.            10.  Wear clean pajamas.            11.  Place clean sheets on your bed the night of your first shower and do not  sleep with pets. Day of Surgery : Do not apply any lotions/deodorants the morning of surgery.  Please wear clean clothes to the hospital/surgery center.  FAILURE TO FOLLOW THESE INSTRUCTIONS MAY RESULT IN THE CANCELLATION OF YOUR SURGERY PATIENT SIGNATURE_________________________________  NURSE SIGNATURE__________________________________  ________________________________________________________________________

## 2022-10-22 ENCOUNTER — Other Ambulatory Visit: Payer: Self-pay

## 2022-10-22 ENCOUNTER — Encounter (HOSPITAL_COMMUNITY)
Admission: RE | Admit: 2022-10-22 | Discharge: 2022-10-22 | Disposition: A | Discharge status: Home / Self Care | Payer: 59 | Source: Ambulatory Visit | Attending: Podiatry | Admitting: Podiatry

## 2022-10-22 ENCOUNTER — Encounter (HOSPITAL_COMMUNITY): Payer: Self-pay

## 2022-10-22 DIAGNOSIS — I10 Essential (primary) hypertension: Secondary | ICD-10-CM | POA: Insufficient documentation

## 2022-10-22 DIAGNOSIS — Z01812 Encounter for preprocedural laboratory examination: Secondary | ICD-10-CM | POA: Diagnosis present

## 2022-10-22 DIAGNOSIS — Z01818 Encounter for other preprocedural examination: Secondary | ICD-10-CM | POA: Insufficient documentation

## 2022-10-22 NOTE — Progress Notes (Signed)
For Short Stay: [place] appointment date:  Bowel Prep reminder:   For Anesthesia: PCP - Dr. Caren Macadam Cardiologist - N/A  Chest x-ray - 03/29/22 EKG - 03/30/22 Stress Test -  ECHO - 09/21/15 Cardiac Cath - 12/24/11 Pacemaker/ICD device last checked: Pacemaker orders received: Device Rep notified:  Spinal Cord Stimulator:  Sleep Study -  CPAP -   Fasting Blood Sugar -  Checks Blood Sugar _____ times a day Date and result of last Hgb A1c-  Last dose of GLP1 agonist-  GLP1 instructions:   Last dose of SGLT-2 inhibitors-  SGLT-2 instructions:   Blood Thinner Instructions: Aspirin Instructions: Last Dose:  Activity level: Can go up a flight of stairs and activities of daily living without stopping and without chest pain and/or shortness of breath   Able to exercise without chest pain and/or shortness of breath   Anesthesia review: Hx: COPD,Trach,HTN,Bradycardia,syncope  Patient denies shortness of breath, fever, cough and chest pain at PAT appointment   Patient verbalized understanding of instructions that were given to them at the PAT appointment. Patient was also instructed that they will need to review over the PAT instructions again at home before surgery.

## 2022-10-26 ENCOUNTER — Encounter (HOSPITAL_COMMUNITY)
Admission: RE | Admit: 2022-10-26 | Discharge: 2022-10-26 | Disposition: A | Discharge status: Home / Self Care | Payer: 59 | Source: Ambulatory Visit | Attending: Podiatry | Admitting: Podiatry

## 2022-10-26 DIAGNOSIS — J449 Chronic obstructive pulmonary disease, unspecified: Secondary | ICD-10-CM | POA: Insufficient documentation

## 2022-10-26 DIAGNOSIS — M216X2 Other acquired deformities of left foot: Secondary | ICD-10-CM | POA: Insufficient documentation

## 2022-10-26 DIAGNOSIS — I10 Essential (primary) hypertension: Secondary | ICD-10-CM | POA: Diagnosis not present

## 2022-10-26 DIAGNOSIS — Z01812 Encounter for preprocedural laboratory examination: Secondary | ICD-10-CM | POA: Diagnosis not present

## 2022-10-26 LAB — CBC
HCT: 42.7 % (ref 39.0–52.0)
Hemoglobin: 14.7 g/dL (ref 13.0–17.0)
MCH: 31.7 pg (ref 26.0–34.0)
MCHC: 34.4 g/dL (ref 30.0–36.0)
MCV: 92.2 fL (ref 80.0–100.0)
Platelets: 293 10*3/uL (ref 150–400)
RBC: 4.63 MIL/uL (ref 4.22–5.81)
RDW: 12.6 % (ref 11.5–15.5)
WBC: 4.6 10*3/uL (ref 4.0–10.5)
nRBC: 0 % (ref 0.0–0.2)

## 2022-10-26 LAB — BASIC METABOLIC PANEL
Anion gap: 8 (ref 5–15)
BUN: 18 mg/dL (ref 8–23)
CO2: 24 mmol/L (ref 22–32)
Calcium: 9 mg/dL (ref 8.9–10.3)
Chloride: 104 mmol/L (ref 98–111)
Creatinine, Ser: 0.85 mg/dL (ref 0.61–1.24)
GFR, Estimated: >60 mL/min (ref >60)
Glucose, Bld: 93 mg/dL (ref 70–99)
Potassium: 3.7 mmol/L (ref 3.5–5.1)
Sodium: 136 mmol/L (ref 135–145)

## 2022-10-26 NOTE — Patient Instructions (Addendum)
SURGICAL WAITING ROOM VISITATION  Patients having surgery or a procedure may have no more than 2 support people in the waiting area - these visitors may rotate.    Children under the age of 53 must have an adult with them who is not the patient.  Due to an increase in RSV and influenza rates and associated hospitalizations, children ages 49 and under may not visit patients in [place].  If the patient needs to stay at the hospital during part of their recovery, the visitor guidelines for inpatient rooms apply. Pre-op nurse will coordinate an appropriate time for 1 support person to accompany patient in pre-op.  This support person may not rotate.    Please refer to the [facility] website for the visitor guidelines for Inpatients (after your surgery is over and you are in a regular room).       Your procedure is scheduled on:  10/30/2022    Report to [facility] Main Entrance    Report to admitting at  1015 AM   Call this number if you have problems the morning of surgery [phone]   Do not eat food  or drink liquids :After Midnight.   After Midnight you may have the following liquids until ______ AM/ PM DAY OF SURGERY                        If you have questions, please contact your surgeon's office.     Oral Hygiene is also important to reduce your risk of infection.                                    Remember - BRUSH YOUR TEETH THE MORNING OF SURGERY WITH YOUR REGULAR TOOTHPASTE  DENTURES WILL BE REMOVED PRIOR TO SURGERY PLEASE DO NOT APPLY "Poly grip" OR ADHESIVES!!!   Do NOT smoke after Midnight   Take these medicines the morning of surgery with A SIP OF WATER:  nebulizer if needed, inhalers as usual and bring, zyrtec if needed  proscar, flonase if needed  protonix   DO NOT TAKE ANY ORAL DIABETIC MEDICATIONS DAY OF YOUR SURGERY  Bring CPAP mask and tubing day of surgery.                              You may not have any metal on your body  including hair pins, jewelry, and body piercing             Do not wear make-up, lotions, powders, perfumes/cologne, or deodorant  Do not wear nail polish including gel and S&S, artificial/acrylic nails, or any other type of covering on natural nails including finger and toenails. If you have artificial nails, gel coating, etc. that needs to be removed by a nail salon please have this removed prior to surgery or surgery may need to be canceled/ delayed if the surgeon/ anesthesia feels like they are unable to be safely monitored.   Do not shave  48 hours prior to surgery.               Men may shave face and neck.   Do not bring valuables to the hospital. [place].   Contacts, glasses, dentures or bridgework  may not be worn into surgery.   Bring small overnight bag day of surgery.   DO NOT [place]. PHARMACY WILL DISPENSE MEDICATIONS LISTED ON YOUR MEDICATION LIST TO YOU DURING YOUR ADMISSION [place]!    Patients discharged on the day of surgery will not be allowed to drive home.  Someone NEEDS to stay with you for the first 24 hours after anesthesia.   Special Instructions: Bring a copy of your healthcare power of attorney and living will documents the day of surgery if you haven't scanned them before.              Please read over the following fact sheets you were given: IF [place] [phone]   If you received a COVID test during your pre-op visit  it is requested that you wear a mask when out in public, stay away from anyone that may not be feeling well and notify your surgeon if you develop symptoms. If you test positive for Covid or have been in contact with anyone that has tested positive in the last 10 days please notify you surgeon.    [place] - Preparing for Surgery Before surgery, you can play an important role.  Because skin  is not sterile, your skin needs to be as free of germs as possible.  You can reduce the number of germs on your skin by washing with CHG (chlorahexidine gluconate) soap before surgery.  CHG is an antiseptic cleaner which kills germs and bonds with the skin to continue killing germs even after washing. Please DO NOT use if you have an allergy to CHG or antibacterial soaps.  If your skin becomes reddened/irritated stop using the CHG and inform your nurse when you arrive at Short Stay. Do not shave (including legs and underarms) for at least 48 hours prior to the first CHG shower.  You may shave your face/neck. Please follow these instructions carefully:  1.  Shower with CHG Soap the night before surgery and the  morning of Surgery.  2.  If you choose to wash your hair, wash your hair first as usual with your  normal  shampoo.  3.  After you shampoo, rinse your hair and body thoroughly to remove the  shampoo.                           4.  Use CHG as you would any other liquid soap.  You can apply chg directly  to the skin and wash                       Gently with a scrungie or clean washcloth.  5.  Apply the CHG Soap to your body ONLY FROM THE NECK DOWN.   Do not use on face/ open                           Wound or open sores. Avoid contact with eyes, ears mouth and genitals (private parts).                       Wash face,  Genitals (private parts) with your normal soap.             6.  Wash thoroughly, paying special attention to the area where your surgery  will be  performed.  7.  Thoroughly rinse your body with warm water from the neck down.  8.  DO NOT shower/wash with your normal soap after using and rinsing off  the CHG Soap.                9.  Pat yourself dry with a clean towel.            10.  Wear clean pajamas.            11.  Place clean sheets on your bed the night of your first shower and do not  sleep with pets. Day of Surgery : Do not apply any lotions/deodorants the morning of  surgery.  Please wear clean clothes to the hospital/surgery center.  FAILURE TO FOLLOW THESE INSTRUCTIONS MAY RESULT IN THE CANCELLATION OF YOUR SURGERY PATIENT SIGNATURE_________________________________  NURSE SIGNATURE__________________________________  ________________________________________________________________________

## 2022-10-26 NOTE — Progress Notes (Signed)
Anesthesia Review:  PCP: Cardiologist : Pulmonology- LOV 09/22/22 - DR Wert  Chest x-ray : 03/29/22- 2 view  EKG : 03/30/22  Echo : 2017  Stress test: Cardiac Cath :  Activity level:  Sleep Study/ CPAP : Fasting Blood Sugar :      / Checks Blood Sugar -- times a day:   Blood Thinner/ Instructions /Last Dose: ASA / Instructions/ Last Dose :
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
## 2022-10-28 DIAGNOSIS — Q667 Congenital pes cavus, unspecified foot: Secondary | ICD-10-CM | POA: Diagnosis not present

## 2022-10-30 ENCOUNTER — Ambulatory Visit (HOSPITAL_COMMUNITY): Payer: 59

## 2022-10-30 ENCOUNTER — Encounter (HOSPITAL_COMMUNITY)
Admission: RE | Disposition: A | Discharge status: Home / Self Care | Payer: Self-pay | Source: Ambulatory Visit | Attending: Podiatry

## 2022-10-30 ENCOUNTER — Ambulatory Visit (HOSPITAL_COMMUNITY): Payer: 59 | Admitting: Physician Assistant

## 2022-10-30 ENCOUNTER — Ambulatory Visit (HOSPITAL_COMMUNITY)
Admission: RE | Admit: 2022-10-30 | Discharge: 2022-10-30 | Disposition: A | Discharge status: Home / Self Care | Payer: 59 | Source: Ambulatory Visit | Attending: Podiatry | Admitting: Podiatry

## 2022-10-30 ENCOUNTER — Encounter (HOSPITAL_COMMUNITY): Payer: Self-pay | Admitting: Podiatry

## 2022-10-30 ENCOUNTER — Other Ambulatory Visit: Payer: Self-pay

## 2022-10-30 ENCOUNTER — Ambulatory Visit (HOSPITAL_BASED_OUTPATIENT_CLINIC_OR_DEPARTMENT_OTHER): Payer: 59 | Admitting: Certified Registered Nurse Anesthetist

## 2022-10-30 DIAGNOSIS — F419 Anxiety disorder, unspecified: Secondary | ICD-10-CM | POA: Diagnosis not present

## 2022-10-30 DIAGNOSIS — I10 Essential (primary) hypertension: Secondary | ICD-10-CM | POA: Diagnosis not present

## 2022-10-30 DIAGNOSIS — Q66222 Congenital metatarsus adductus, left foot: Secondary | ICD-10-CM | POA: Insufficient documentation

## 2022-10-30 DIAGNOSIS — M21532 Acquired clawfoot, left foot: Secondary | ICD-10-CM

## 2022-10-30 DIAGNOSIS — Q6672 Congenital pes cavus, left foot: Secondary | ICD-10-CM

## 2022-10-30 DIAGNOSIS — J449 Chronic obstructive pulmonary disease, unspecified: Secondary | ICD-10-CM | POA: Diagnosis not present

## 2022-10-30 DIAGNOSIS — M62462 Contracture of muscle, left lower leg: Secondary | ICD-10-CM | POA: Diagnosis not present

## 2022-10-30 DIAGNOSIS — M67979 Unspecified disorder of synovium and tendon, unspecified ankle and foot: Secondary | ICD-10-CM | POA: Diagnosis not present

## 2022-10-30 DIAGNOSIS — Q6689 Other  specified congenital deformities of feet: Secondary | ICD-10-CM | POA: Insufficient documentation

## 2022-10-30 DIAGNOSIS — G8918 Other acute postprocedural pain: Secondary | ICD-10-CM | POA: Diagnosis not present

## 2022-10-30 HISTORY — PX: CALCANEAL OSTEOTOMY: SHX1281

## 2022-10-30 SURGERY — OSTEOTOMY, CALCANEUS
Anesthesia: Regional | Site: Foot | Laterality: Left

## 2022-10-30 MED ORDER — FENTANYL CITRATE (PF) 100 MCG/2ML IJ SOLN
INTRAMUSCULAR | Status: AC
  Filled 2022-10-30: qty 2

## 2022-10-30 MED ORDER — ACETAMINOPHEN 10 MG/ML IV SOLN
INTRAVENOUS | Status: AC
  Administered 2022-10-30: 1000 mg via INTRAVENOUS
  Filled 2022-10-30: qty 100

## 2022-10-30 MED ORDER — ORAL CARE MOUTH RINSE: 15.0000 mL | Freq: Once | OROMUCOSAL | Status: AC

## 2022-10-30 MED ORDER — FENTANYL CITRATE PF 50 MCG/ML IJ SOSY: 50.0000 ug | PREFILLED_SYRINGE | INTRAMUSCULAR | Status: DC

## 2022-10-30 MED ORDER — SUCCINYLCHOLINE CHLORIDE 200 MG/10ML IV SOSY
PREFILLED_SYRINGE | INTRAVENOUS | Status: DC | PRN
  Administered 2022-10-30: 140 mg via INTRAVENOUS

## 2022-10-30 MED ORDER — FENTANYL CITRATE PF 50 MCG/ML IJ SOSY
PREFILLED_SYRINGE | INTRAMUSCULAR | Status: AC
  Administered 2022-10-30: 50 ug via INTRAVENOUS
  Filled 2022-10-30: qty 2

## 2022-10-30 MED ORDER — PROPOFOL 10 MG/ML IV BOLUS
INTRAVENOUS | Status: AC
  Filled 2022-10-30: qty 20

## 2022-10-30 MED ORDER — CHLORHEXIDINE GLUCONATE 0.12 % MT SOLN
15.0000 mL | Freq: Once | OROMUCOSAL | Status: AC
  Administered 2022-10-30: 15 mL via OROMUCOSAL

## 2022-10-30 MED ORDER — CEFAZOLIN SODIUM-DEXTROSE 2-3 GM-%(50ML) IV SOLR
INTRAVENOUS | Status: DC | PRN
  Administered 2022-10-30: 2 g via INTRAVENOUS

## 2022-10-30 MED ORDER — FENTANYL CITRATE (PF) 100 MCG/2ML IJ SOLN
INTRAMUSCULAR | Status: DC | PRN
  Administered 2022-10-30: 50 ug via INTRAVENOUS
  Administered 2022-10-30 (×2): 25 ug via INTRAVENOUS

## 2022-10-30 MED ORDER — ONDANSETRON HCL 4 MG/2ML IJ SOLN
INTRAMUSCULAR | Status: AC
  Filled 2022-10-30: qty 2

## 2022-10-30 MED ORDER — DEXAMETHASONE SODIUM PHOSPHATE 10 MG/ML IJ SOLN
INTRAMUSCULAR | Status: AC
  Filled 2022-10-30: qty 1

## 2022-10-30 MED ORDER — ROPIVACAINE HCL 5 MG/ML IJ SOLN
INTRAMUSCULAR | Status: DC | PRN
  Administered 2022-10-30: 30 mL via PERINEURAL

## 2022-10-30 MED ORDER — MIDAZOLAM HCL 2 MG/2ML IJ SOLN
INTRAMUSCULAR | Status: AC
  Filled 2022-10-30: qty 2

## 2022-10-30 MED ORDER — ACETAMINOPHEN 10 MG/ML IV SOLN: 1000.0000 mg | Freq: Once | INTRAVENOUS | Status: DC | PRN

## 2022-10-30 MED ORDER — FENTANYL CITRATE PF 50 MCG/ML IJ SOSY
PREFILLED_SYRINGE | INTRAMUSCULAR | Status: AC
  Filled 2022-10-30: qty 2

## 2022-10-30 MED ORDER — PROPOFOL 10 MG/ML IV BOLUS
INTRAVENOUS | Status: DC | PRN
  Administered 2022-10-30: 70 mg via INTRAVENOUS

## 2022-10-30 MED ORDER — DEXAMETHASONE SODIUM PHOSPHATE 10 MG/ML IJ SOLN
INTRAMUSCULAR | Status: DC | PRN
  Administered 2022-10-30: 10 mg via INTRAVENOUS

## 2022-10-30 MED ORDER — LIDOCAINE 2% (20 MG/ML) 5 ML SYRINGE
INTRAMUSCULAR | Status: DC | PRN
  Administered 2022-10-30: 80 mg via INTRAVENOUS

## 2022-10-30 MED ORDER — BUPIVACAINE HCL (PF) 0.5 % IJ SOLN
INTRAMUSCULAR | Status: AC
  Filled 2022-10-30: qty 30

## 2022-10-30 MED ORDER — ONDANSETRON HCL 4 MG/2ML IJ SOLN
INTRAMUSCULAR | Status: DC | PRN
  Administered 2022-10-30: 4 mg via INTRAVENOUS

## 2022-10-30 MED ORDER — MIDAZOLAM HCL 2 MG/2ML IJ SOLN: 1.0000 mg | INTRAMUSCULAR | Status: DC

## 2022-10-30 MED ORDER — LACTATED RINGERS IV SOLN: INTRAVENOUS | Status: DC

## 2022-10-30 MED ORDER — 0.9 % SODIUM CHLORIDE (POUR BTL) OPTIME
TOPICAL | Status: DC | PRN
  Administered 2022-10-30: 1000 mL

## 2022-10-30 MED ORDER — MIDAZOLAM HCL 5 MG/5ML IJ SOLN
INTRAMUSCULAR | Status: DC | PRN
  Administered 2022-10-30: 2 mg via INTRAVENOUS

## 2022-10-30 MED ORDER — MIDAZOLAM HCL 2 MG/2ML IJ SOLN
INTRAMUSCULAR | Status: AC
  Administered 2022-10-30: 1 mg via INTRAVENOUS
  Filled 2022-10-30: qty 2

## 2022-10-30 MED ORDER — SUGAMMADEX SODIUM 200 MG/2ML IV SOLN
INTRAVENOUS | Status: DC | PRN
  Administered 2022-10-30: 200 mg via INTRAVENOUS

## 2022-10-30 MED ORDER — CEFAZOLIN SODIUM-DEXTROSE 2-4 GM/100ML-% IV SOLN
INTRAVENOUS | Status: AC
  Filled 2022-10-30: qty 100

## 2022-10-30 MED ORDER — FENTANYL CITRATE PF 50 MCG/ML IJ SOSY: 25.0000 ug | PREFILLED_SYRINGE | INTRAMUSCULAR | Status: DC | PRN

## 2022-10-30 MED ORDER — ROCURONIUM BROMIDE 10 MG/ML (PF) SYRINGE
PREFILLED_SYRINGE | INTRAVENOUS | Status: DC | PRN
  Administered 2022-10-30: 30 mg via INTRAVENOUS
  Administered 2022-10-30: 10 mg via INTRAVENOUS
  Administered 2022-10-30: 30 mg via INTRAVENOUS

## 2022-10-30 SURGICAL SUPPLY — 101 items
APL PRP STRL LF DISP 70% ISPRP (MISCELLANEOUS) ×2
BAG COUNTER SPONGE SURGICOUNT (BAG) IMPLANT
BAG SPNG CNTER NS LX DISP (BAG)
BANDAGE ESMARK 6X9 LF (GAUZE/BANDAGES/DRESSINGS) ×3 IMPLANT
BIT DRILL CANN 4.0X150 (DRILL) ×1 IMPLANT
BIT DRILL CANNULATED 4.6 (BIT) ×2 IMPLANT
BIT DRILL CNTRSNK MONSTER 7.0 (DRILL) ×1 IMPLANT
BLADE AVERAGE 25X9 (BLADE) IMPLANT
BLADE HEX COATED 2.75 (ELECTRODE) ×3 IMPLANT
BLADE MICRO SAGITTAL (BLADE) ×3 IMPLANT
BLADE SAW SGTL 81X20 HD (BLADE) ×1 IMPLANT
BLADE SURG 15 STRL LF DISP TIS (BLADE) ×9 IMPLANT
BLADE SURG 15 STRL SS (BLADE) ×6
BNDG CMPR 9X4 STRL LF SNTH (GAUZE/BANDAGES/DRESSINGS) ×2
BNDG CMPR 9X6 STRL LF SNTH (GAUZE/BANDAGES/DRESSINGS) ×2
BNDG CMPR MED 10X6 ELC LF (GAUZE/BANDAGES/DRESSINGS) ×2
BNDG ELASTIC 3X5.8 VLCR STR LF (GAUZE/BANDAGES/DRESSINGS) ×3 IMPLANT
BNDG ELASTIC 4X5.8 VLCR STR LF (GAUZE/BANDAGES/DRESSINGS) ×2 IMPLANT
BNDG ELASTIC 6X10 VLCR STRL LF (GAUZE/BANDAGES/DRESSINGS) ×1 IMPLANT
BNDG ELASTIC 6X5.8 VLCR STR LF (GAUZE/BANDAGES/DRESSINGS) ×4 IMPLANT
BNDG ESMARK 4X9 LF (GAUZE/BANDAGES/DRESSINGS) ×3 IMPLANT
BNDG ESMARK 6X9 LF (GAUZE/BANDAGES/DRESSINGS) ×2
BNDG GAUZE DERMACEA FLUFF 4 (GAUZE/BANDAGES/DRESSINGS) ×3 IMPLANT
BNDG GZE DERMACEA 4 6PLY (GAUZE/BANDAGES/DRESSINGS) ×2
CHLORAPREP W/TINT 26 (MISCELLANEOUS) ×3 IMPLANT
COUNTERSINK 7.0 (MISCELLANEOUS) ×2
COVER BACK TABLE 60X90IN (DRAPES) ×3 IMPLANT
COVER MAYO STAND STRL (DRAPES) IMPLANT
CUFF TOURN SGL QUICK 18X4 (TOURNIQUET CUFF) IMPLANT
CUFF TOURN SGL QUICK 24 (TOURNIQUET CUFF) ×2
CUFF TOURN SGL QUICK 34 (TOURNIQUET CUFF)
CUFF TRNQT CYL 24X4X16.5-23 (TOURNIQUET CUFF) ×1 IMPLANT
CUFF TRNQT CYL 34X4.125X (TOURNIQUET CUFF) IMPLANT
DRAPE C-ARM 42X120 X-RAY (DRAPES) ×1 IMPLANT
DRAPE C-ARMOR (DRAPES) ×3 IMPLANT
DRAPE EXTREMITY T 121X128X90 (DISPOSABLE) ×3 IMPLANT
DRAPE IMP U-DRAPE 54X76 (DRAPES) ×3 IMPLANT
DRAPE OEC MINIVIEW 54X84 (DRAPES) ×1 IMPLANT
DRAPE U-SHAPE 47X51 STRL (DRAPES) ×3 IMPLANT
DRILL CANN 4.0X150 (DRILL) ×2
DRILL COUNTERSINK MONSTER 7.0 (DRILL) ×2
DRSG ADAPTIC 3X8 NADH LF (GAUZE/BANDAGES/DRESSINGS) ×1 IMPLANT
DRSG EMULSION OIL 3X3 NADH (GAUZE/BANDAGES/DRESSINGS) ×3 IMPLANT
ELECT REM PT RETURN 15FT ADLT (MISCELLANEOUS) ×3 IMPLANT
GAUZE 4X4 16PLY ~~LOC~~+RFID DBL (SPONGE) IMPLANT
GAUZE PAD ABD 8X10 STRL (GAUZE/BANDAGES/DRESSINGS) ×2 IMPLANT
GAUZE SPONGE 4X4 12PLY STRL (GAUZE/BANDAGES/DRESSINGS) ×3 IMPLANT
GAUZE XEROFORM 1X8 LF (GAUZE/BANDAGES/DRESSINGS) ×3 IMPLANT
GLOVE BIO SURGEON STRL SZ7.5 (GLOVE) ×3 IMPLANT
GLOVE BIO SURGEON STRL SZ8 (GLOVE) ×3 IMPLANT
GLOVE BIOGEL PI IND STRL 8 (GLOVE) ×3 IMPLANT
GOWN L4 XXLG W/PAP TWL (GOWN DISPOSABLE) ×3 IMPLANT
GOWN STRL REUS W/ TWL LRG LVL3 (GOWN DISPOSABLE) ×3 IMPLANT
GOWN STRL REUS W/ TWL XL LVL3 (GOWN DISPOSABLE) ×3 IMPLANT
GOWN STRL REUS W/TWL LRG LVL3 (GOWN DISPOSABLE) ×2
GOWN STRL REUS W/TWL XL LVL3 (GOWN DISPOSABLE) ×2
K-WIRE 1.6X20 (WIRE) ×4
K-WIRE SINGLE TROCAR 2.3X230 (WIRE) ×4
KIT BASIN OR (CUSTOM PROCEDURE TRAY) ×3 IMPLANT
KIT TURNOVER KIT A (KITS) IMPLANT
KWIRE 1.6X20 (WIRE) ×2 IMPLANT
KWIRE SINGLE TROCAR 2.3X230 (WIRE) ×2 IMPLANT
NDL HYPO 25X1 1.5 SAFETY (NEEDLE) ×2 IMPLANT
NEEDLE HYPO 25X1 1.5 SAFETY (NEEDLE) ×2 IMPLANT
NS IRRIG 1000ML POUR BTL (IV SOLUTION) IMPLANT
PAD CAST 4YDX4 CTTN HI CHSV (CAST SUPPLIES) ×3 IMPLANT
PADDING CAST ABS COTTON 4X4 ST (CAST SUPPLIES) ×3 IMPLANT
PADDING CAST COTTON 4X4 STRL (CAST SUPPLIES) ×2
PASSER SUT W NITINOL LOOP (INSTRUMENTS) ×1 IMPLANT
PENCIL SMOKE EVACUATOR (MISCELLANEOUS) ×3 IMPLANT
SCREW CANN FT 7.0X55 (Screw) ×1 IMPLANT
SCREW CANN THRD SHORT 7X55X16 (Screw) ×1 IMPLANT
SCREW COUNTERSINK 7.0 (MISCELLANEOUS) ×1 IMPLANT
SCREW INTERFACE SYSTM 4.5X20 (Screw) ×1 IMPLANT
SHEET MEDIUM DRAPE 40X70 STRL (DRAPES) ×3 IMPLANT
SLEEVE SCD COMPRESS KNEE MED (STOCKING) ×3 IMPLANT
SPLINT FIBERGLASS 4X30 (CAST SUPPLIES) ×3 IMPLANT
SPONGE T-LAP 4X18 ~~LOC~~+RFID (SPONGE) ×3 IMPLANT
STAPLER VISISTAT 35W (STAPLE) IMPLANT
STOCKINETTE 4X48 STRL (DRAPES) ×3 IMPLANT
STOCKINETTE 6  STRL (DRAPES) ×2
STOCKINETTE 6 STRL (DRAPES) ×3 IMPLANT
STOCKINETTE 8 INCH (MISCELLANEOUS) ×3 IMPLANT
SUCTION FRAZIER HANDLE 10FR (MISCELLANEOUS) ×2
SUCTION TUBE FRAZIER 10FR DISP (MISCELLANEOUS) ×3 IMPLANT
SUT ETHILON 4 0 PS 2 18 (SUTURE) IMPLANT
SUT MERSILENE 4 0 P 3 (SUTURE) IMPLANT
SUT MNCRL AB 3-0 PS2 18 (SUTURE) ×3 IMPLANT
SUT MNCRL AB 4-0 PS2 18 (SUTURE) ×3 IMPLANT
SUT MON AB 5-0 PS2 18 (SUTURE) IMPLANT
SUT VIC AB 2-0 SH 27 (SUTURE) ×2
SUT VIC AB 2-0 SH 27XBRD (SUTURE) ×3 IMPLANT
SUT VIC AB 3-0 FS2 27 (SUTURE) ×3 IMPLANT
SUT VICRYL 4-0 PS2 18IN ABS (SUTURE) ×3 IMPLANT
SYR BULB EAR ULCER 3OZ GRN STR (SYRINGE) ×3 IMPLANT
SYR CONTROL 10ML LL (SYRINGE) ×3 IMPLANT
TOWEL GREEN STERILE FF (TOWEL DISPOSABLE) ×3 IMPLANT
TOWEL OR 17X26 10 PK STRL BLUE (TOWEL DISPOSABLE) ×3 IMPLANT
UNDERPAD 30X36 HEAVY ABSORB (UNDERPADS AND DIAPERS) ×3 IMPLANT
YANKAUER SUCT BULB TIP 10FT TU (MISCELLANEOUS) ×3 IMPLANT
YANKAUER SUCT BULB TIP NO VENT (SUCTIONS) ×3 IMPLANT

## 2022-10-30 NOTE — Anesthesia Procedure Notes (Signed)
Anesthesia Regional Block: Popliteal block   Pre-Anesthetic Checklist: , timeout performed,  Correct Patient, Correct Site, Correct Laterality,  Correct Procedure, Correct Position, site marked,  Risks and benefits discussed,  Surgical consent,  Pre-op evaluation,  At surgeon's request and post-op pain management  Laterality: Left  Prep: Dura Prep       Needles:  Injection technique: Single-shot  Needle Type: Echogenic Stimulator Needle     Needle Length: 10cm  Needle Gauge: 20     Additional Needles:   Procedures:,,,, ultrasound used (permanent image in chart),,    Narrative:  Start time: 10/30/2022 11:56 AM End time: 10/30/2022 12:01 PM Injection made incrementally with aspirations every 5 mL.  Performed by: Personally  Anesthesiologist: Darral Dash, DO  Additional Notes: Patient identified. Risks/Benefits/Options discussed with patient including but not limited to bleeding, infection, nerve damage, failed block, incomplete pain control. Patient expressed understanding and wished to proceed. All questions were answered. Sterile technique was used throughout the entire procedure. Please see nursing notes for vital signs. Aspirated in 5cc intervals with injection for negative confirmation. Patient was given instructions on fall risk and not to get out of bed. All questions and concerns addressed with instructions to call with any issues or inadequate analgesia.

## 2022-10-30 NOTE — Progress Notes (Signed)
Pt assisted up to restroom from stretcher in recovery room at approx. 1550, once standing on right leg and left leg bent with knee resting on mobility scooter it was noted that blood was dripping from the opening in cast/bandage of left leg. Pt took leg off scooter to toilet and blood began pooling out of dressing/cast onto floor. Pt was than assisted back to stretcher and taken back to phase one of recovery, Dr. Waldron Session was called and will come to recovery to assess the patient.

## 2022-10-30 NOTE — Progress Notes (Signed)
Dr. Waldron Session at bedside since 1651 to evaluate bleeding to LLE coming from inside cast/dressing. Dr. Waldron Session placed new dressing and re secured cast with ace bandage. Per Dr. Waldron Session, okay for patient to continue to phase 2 of recovery at this time and okay for pt to continue as planned for discharge home.

## 2022-10-30 NOTE — Op Note (Addendum)
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 Lateralizing calcaneal osteotomy, anterior tibial tendon transfer, peroneal longus to brevis tendon transfer, Z-lengthening of posterior tibial tendon  Surgeon: Antionette Poles, DPM  Anesthesia: popliteal block per anesthesia team  Hemostasis: Thigh tourniquet inflated to a pressure of 279mHg after esmarch exsanguination   EBL: 30 mL Materials: 2, 7-0 cannulated Paragon screws a 3.5 mm Paragon Bio-Tenodesis screw Injectables: none Pathology: none  Condition: The patient tolerated the procedure and anesthesia well. No complications noted or reported   Justification for procedure: The patient is a 72y.o. male who presents today for surgical correction of Left foot cavus deformity. All conservative modalities of been unsuccessful in providing any sort of satisfactory alleviation of symptoms with the patient. The patient was identified preoperatively and the correct operative extremity was marked by myself. Patient was educated pre-operatively on risks, complications, and alternatives. The patient consented for surgical correction. The patient consent form was reviewed. All patient questions were answered. No guarantees were expressed or implied. The patient and the surgeon both signed the patient consent form with the witness present and placed in the patient's chart.   Procedure in Detail: The patient was brought to the operating room, placed on the operating table in a supine position and anesthesia was induced. The left lower extremity was prepped and draped in a standard sterile fashion and a timeout was performed to verify the correct operative site. Attention was then directed to the surgical area where procedure number one commenced.  Procedure #1:   Lateral Calcaneal Slide Osteotomy CPT  28300: Began the case by utilizing fluoroscopy to identify the lateral wall of the calcaneus.  The incision was marked out overlying the eventual osteotomy. I then proceeded to create an oblique incision overlying the lateral aspect of the posterior tuberosity of the calcaneus.  The incision was deepened down through the soft tissues to the level of the periosteum which was divided in line with the skin incision.  An oscillating saw was utilized to create an osteotomy through the tuberosity of the calcaneus and the calcaneus was then shifted approximately 11 mm laterally.  I was happy with the correction clinically and proceeded then drive a guidepin for the 7.5 mm Paragon screws from the posterior-inferior apex of the calcaneus, across the osteotomy site and into the anterior portion of the calcaneus.  A second guidepin for 7.0 mm Paragon screw was then inserted across the osteotomy site.  Trajectory was confirmed fluoroscopically and we then proceeded to overream the pin and inserted a 7.0 mm Paragon screw.  We had excellent purchase with the screw and I was happy with the correction both clinically and fluoroscopically.  I then reamed over the second guidewire and a fully threaded headed 55 mm length 7.5 mm screw was then inserted overlying the guidewire. this wound would be irrigated and closed with interrupted 3-0 vicryl for subcuticular closure and staples for skin closure.  Procedure #2:  Peroneus longus to brevis tendon transfer. CPT [id number] I began the case by lengthening the incision along the lateral calcaneus proximally.  The incision was carefully deepened down through the soft tissues, taking great care to identify and protect the sural nerve.  The superior and inferior peroneal retinaculum's were divided as was the tendon sheath.  This gave u[place]ore[place]ood access to the peroneal tendons.  The  peroneus longus and brevis tendon was directly visualized.    Peroneus brevis to longus tendon transfer : Due  to patients weakness in eversion of the foot, the decision was made for a peroneus longus to brevis tendon transfer to increase his eversion strength.  Therefore we proceeded to transect the peroneus longus tendon.  The segment of tendon was removed from the field.  I then proceeded to place the ankle in a neutral position and transferred the proximal stump of the longus  tendon in a side to side repair fashion.  The woven segment of the repair was then stabilized using 0 Vicryl figure-of-eight sutures.    At this point I felt that we had a good secure transfer and good tendon excursion.  The wound was irrigated with normal saline. I then proceeded to repair the tendon sheath and both the superior and inferior peroneal retinaculum with interrupted 2-0 Vicryl sutures.  The tourniquet was released and hemostasis was secured.  The skin was then closed with interrupted 3-0 vicryl for subcutaneous closure and staples for skin closure.  Procedure #3  Z lengthening of posterior tibial tendon.    An approximately 10 cm incision was made over the medial aspect of the left foot overlying the posterior tibial tendon and the insertion of the tibialis anterior tendon as well as the first metatarsal base.  The incision was then carefully deepened down through soft tissues taking care to identify and protect neurovascular structures.  The sheath of the posterior tibial tendon was then identified along the medial aspect of the foot and incised.  A Z shaped lengthening was then performed in approximately 2 and half millimeters of lengthening occurred during the procedure.  I then repaired the tendon slips to one another with 0 Vicryl in a figure-of-eight fashion utilizing 4 stitches.   Procedure #4 Tibialis anterior split tendon transfer.  Through the same medial incision the sheath of the tibialis anterior was then identified and incised.  I was able to raise a flap dorsally overlying the foot and was able to split the  tibialis anterior tendon and half distally off of its insertion from the medial cuneiform.  An approximately 3 cm incision was then made overlying the cuboid bone utilizing fluoroscopy for identifying the incision.  I then returned back to the medial incision and performed a Krakw suture into the transfer portion of the tibialis anterior.  I then created a subcutaneous portal between the medial and lateral incisions overlying the dorsal foot and was able to transfer the tibialis anterior tendon from the medial incision to the lateral incision overlying the cuboid.  I then measured the split portion of the tibialis anterior utilizing a tendon sizer.  I utilized a guidewire under fluoroscopy and verified that the guidewire was fully within the cuboid bone and then overreamed with a 3.5 mm drill in a through and through fashion through the cuboid bone.  I then measured the depth of the cuboid bone which was approximately 20 mm.  I was able to use a tendon passer to pass the the split portion of the tibialis anterior through the cuboid and the foot was held at resting 90 degree tension in the Bio-Tenodesis screw was then inserted through the dorsal cuboid.  This was verified both with direct visualization as well as with fluoroscopy.  I then proceeded to take x-rays and observe the correction that we had obtained thus far during the surgery.  It appears like his deformity was more met adductus  rather than only frontal plane plantarflexion of the first metatarsal and I utilized a footplate in order to assess for the amount of plantarflexion of the first ray.  Decision was made not to perform dorsiflexor first ray osteotomy secondary to adequate correction with rear foot osteotomies and tendon transfers.  Final x-rays were obtained of the foot and ankle and adequate was felt to be adequate.  I then copiously irrigated all the wounds with normal saline and sutured the rest of the incision sites with 0 Vicryl as well  as staples for skin.  Dry sterile compressive dressings were then applied to all previously mentioned incision sites about the patient's lower extremity. The tourniquet which was used for hemostasis was deflated. All normal neurovascular responses including pink color and warmth returned all the digits of patient's lower extremity.  I then applied a very well-padded short leg cast to his left lower extremity  The patient was then transferred from the operating room to the recovery room having tolerated the procedure and anesthesia well. All vital signs are stable. After a brief stay in the recovery room the patient was discharged with adequate prescriptions for analgesia. Verbal as well as written instructions were provided for the patient regarding wound care. The patient is to keep the dressings clean dry and intact until they are to follow surgeon Dr. Antionette Poles, DPM in the office upon discharge.   Antionette Poles DPM

## 2022-10-30 NOTE — Transfer of Care (Signed)
Immediate Anesthesia Transfer of Care Note  Patient: [name] [name]  Procedure(s) Performed: Procedure(s): CALCANEAL OSTEOTOMY, SPLIT ANTERIOR TIBIAL TENDON TRANSFER, PERINEAL TENDON TRANSFER, Z- LENGTHENING OF POSTERIOR TIBIAL TENDON (Left)  Patient Location: PACU  Anesthesia Type:General  Level of Consciousness: Alert, Awake, Oriented  Airway & Oxygen Therapy: Patient Spontanous Breathing  Post-op Assessment: Report given to RN  Post vital signs: Reviewed and stable  Last Vitals:  Vitals:   10/30/22 1448 10/30/22 1451  BP:  135/75  Pulse:  88  Resp:  (!) 22  Temp: 36.4 C   SpO2:  [id number]    Complications: No apparent anesthesia complications

## 2022-10-30 NOTE — Anesthesia Postprocedure Evaluation (Signed)
Anesthesia Post Note  Patient: [name] [name]  Procedure(s) Performed: CALCANEAL OSTEOTOMY, SPLIT ANTERIOR TIBIAL TENDON TRANSFER, PERINEAL TENDON TRANSFER, Z- LENGTHENING OF POSTERIOR TIBIAL TENDON (Left: Foot)     Patient location during evaluation: PACU Anesthesia Type: Regional and General Level of consciousness: sedated Pain management: pain level controlled Vital Signs Assessment: post-procedure vital signs reviewed and stable Respiratory status: spontaneous breathing and respiratory function stable Cardiovascular status: stable Postop Assessment: no apparent nausea or vomiting Anesthetic complications: no   No notable events documented.  Last Vitals:  Vitals:   10/30/22 1700 10/30/22 1740  BP: (!) 143/85 131/76  Pulse: 72 78  Resp: 19   Temp:  36.6 C  SpO2: 99% 97%    Last Pain:  Vitals:   10/30/22 1740  TempSrc: Oral  PainSc:                  [place]

## 2022-10-30 NOTE — Anesthesia Procedure Notes (Signed)
Procedure Name: Intubation Date/Time: 10/30/2022 12:22 PM  Performed by: Gerald Leitz, CRNAPre-anesthesia Checklist: Patient identified, Patient being monitored, Timeout performed, Emergency Drugs available and Suction available Patient Re-evaluated:Patient Re-evaluated prior to induction Oxygen Delivery Method: Circle system utilized Preoxygenation: Pre-oxygenation with 100% oxygen Induction Type: IV induction and Rapid sequence Tube type: Reinforced Tube size: 6.5 mm Number of attempts: 1 Placement Confirmation: positive ETCO2 and breath sounds checked- equal and bilateral Secured at: 15 (skin stoma site) cm Tube secured with: Tape Dental Injury: Teeth and Oropharynx as per pre-operative assessment

## 2022-10-30 NOTE — Progress Notes (Addendum)
H&P note has  been received.    Jasmine Pang BSN, Academic librarian - Perioperative Services [place] [phone]

## 2022-10-30 NOTE — H&P (Signed)
History and Physical Interval Note:  10/30/2022 11:46 AM  [name] [name]  has presented today for surgery, with the diagnosis of Cavus foot left foot.  The various methods of treatment have been discussed with the patient and family. After consideration of risks, benefits and other options for treatment, the patient has consented to   Procedure(s): CALCANEAL OSTEOTOMY (Left) METATARSAL OSTEOTOMY (Left) all other indicated procedures left foot as a surgical intervention.  The patient's history has been reviewed, patient examined, no change in status, stable for surgery.  I have reviewed the patient's chart and labs.  Questions were answered to the patient's satisfaction.     [place]

## 2022-10-30 NOTE — Discharge Instructions (Addendum)
Post-Operative Discharge Instructions  Patient name: Sosaia Beales MRN: [id number] DOB: [birth year]  Date of Surgery: 10/30/2022            Location: [facility]  Follow up appointment:  Next week  On the Day of Surgery: Please pick up prescription(s) prior to the morning of surgery.  Please go directly to the couch or bed and keep your feet elevated by putting two pillows under your feet and one pillow under your knees. Keep your feet out from under the blanket.   Discomfort and Swelling: Your foot may be numb for the remainder of the day. Swelling is expected. In some cases, the skin of the foot or the leg may take on a bruised, black and blue appearance.   Temperature: Take your temperature on the 2nd, 3rd, and 4th day after your surgery at 5:00pm. If your temperature is above 101 degrees, please call the physician's office.   Bleeding: A slight amount of drainage on the dressing is normal. Resting your foot in an elevated position will limit bleeding. If bleeding continues, wrap a towel around your foot and apply an ice pack.   Dressing: Keep the bandage absolutely dry and DO NOT remove the dressing.  If the dressing becomes wet or soiled, call your physician's office immediately.   Stitches: The stitches will remain in place for about two weeks, depending on the nature of your operation. Slight pulling sensation may be felt due to the stitches. This is a normal occurrence.   Ice: Apply a well-sealed ice pack to your foot or behind your knee for 20 minutes out of every hour for the first 24 hours after your surgery. DO NOT leave the ice pack in place at bedtime or during long naps. Do not place an ice pack directly on skin, place a washcloth between the ice pack and the dressing. This will help avoid getting the dressing wet, and help avoid any ice burn.  Weight Bearing Status:  Non-weight bearing     Cast on left leg.  Diet: Return to your regular diet slowly within  24 hours. If you received sedation, your first meal should be light. Do not eat greasy or spicy foods for the first meal. Drink large quantities of liquids, especially water, citrus and other fruit juices. Please call if you're unable to keep fluids down. Eat food with your medication.  General Activities: Recovery is a gradual process; however you should feel better with each passing day. The first day, leave the bed only to go to the bathroom. Gradually increase your activity after the first day. For the first week or two, resting each day is important. Strenuous work, heavy lifting and excessive social activities should be avoided. Rest. Ice. Elevate.  Postoperative Care: The post-operative care periods lasts for approximately 6-12 weeks. During this time, periodic visits to your physician's office will be required so your healing process can be monitored closely. It is essential for your recovery that you continue to be monitored by your physician until you are discharged and completely healed. Care during this time is the most important part of your recovery process.   Recovery from anesthesia: You may feel drowsy and reflexes may be slowed for 24 hours. Do not drive, use machinery, appliances, ride bicycles, or scooter. Do not make important decisions.   Complications: Please call your physician following surgery if you have any of the following complications:          1.)  Severe  pain unrelieved by medication   2.) Excessive heavy or prolonged bleeding   3.) Dizziness or fainting   4.) Soiled or wet dressings   5.) Temperature over 101.0 degrees    Medications: Sent in oxycodone 89m taken every 6 hours as needed. Please take an 81 mg baby aspirin morning and night while in a cast.

## 2022-11-02 ENCOUNTER — Encounter (HOSPITAL_COMMUNITY): Payer: Self-pay | Admitting: Podiatry

## 2022-11-02 ENCOUNTER — Other Ambulatory Visit: Payer: Self-pay | Admitting: Internal Medicine

## 2022-11-06 DIAGNOSIS — Q667 Congenital pes cavus, unspecified foot: Secondary | ICD-10-CM | POA: Diagnosis not present

## 2022-11-09 DIAGNOSIS — Q6689 Other  specified congenital deformities of feet: Secondary | ICD-10-CM

## 2022-11-16 DIAGNOSIS — Z430 Encounter for attention to tracheostomy: Secondary | ICD-10-CM | POA: Diagnosis not present

## 2022-11-20 DIAGNOSIS — M62462 Contracture of muscle, left lower leg: Secondary | ICD-10-CM | POA: Diagnosis not present

## 2022-11-20 DIAGNOSIS — Q6689 Other  specified congenital deformities of feet: Secondary | ICD-10-CM | POA: Diagnosis not present

## 2022-12-05 ENCOUNTER — Emergency Department (HOSPITAL_COMMUNITY)
Admission: EM | Admit: 2022-12-05 | Discharge: 2022-12-05 | Disposition: A | Discharge status: Home / Self Care | Payer: No Typology Code available for payment source | Attending: Emergency Medicine | Admitting: Emergency Medicine

## 2022-12-05 ENCOUNTER — Emergency Department (HOSPITAL_COMMUNITY): Payer: No Typology Code available for payment source

## 2022-12-05 ENCOUNTER — Other Ambulatory Visit: Payer: Self-pay

## 2022-12-05 ENCOUNTER — Encounter (HOSPITAL_COMMUNITY): Payer: Self-pay

## 2022-12-05 DIAGNOSIS — Z7951 Long term (current) use of inhaled steroids: Secondary | ICD-10-CM | POA: Insufficient documentation

## 2022-12-05 DIAGNOSIS — J441 Chronic obstructive pulmonary disease with (acute) exacerbation: Secondary | ICD-10-CM

## 2022-12-05 DIAGNOSIS — R0602 Shortness of breath: Secondary | ICD-10-CM | POA: Diagnosis present

## 2022-12-05 LAB — CBC
HCT: 40.2 % (ref 39.0–52.0)
Hemoglobin: 13.6 g/dL (ref 13.0–17.0)
MCH: 31.3 pg (ref 26.0–34.0)
MCHC: 33.8 g/dL (ref 30.0–36.0)
MCV: 92.6 fL (ref 80.0–100.0)
Platelets: 239 10*3/uL (ref 150–400)
RBC: 4.34 MIL/uL (ref 4.22–5.81)
RDW: 13.2 % (ref 11.5–15.5)
WBC: 9.3 10*3/uL (ref 4.0–10.5)
nRBC: 0 % (ref 0.0–0.2)

## 2022-12-05 LAB — BRAIN NATRIURETIC PEPTIDE: B Natriuretic Peptide: 15.3 pg/mL (ref 0.0–100.0)

## 2022-12-05 LAB — BASIC METABOLIC PANEL
Anion gap: 11 (ref 5–15)
BUN: 13 mg/dL (ref 8–23)
CO2: 22 mmol/L (ref 22–32)
Calcium: 8.9 mg/dL (ref 8.9–10.3)
Chloride: 100 mmol/L (ref 98–111)
Creatinine, Ser: 0.91 mg/dL (ref 0.61–1.24)
GFR, Estimated: >60 mL/min (ref >60)
Glucose, Bld: 111 mg/dL — ABNORMAL HIGH (ref 70–99)
Potassium: 3.5 mmol/L (ref 3.5–5.1)
Sodium: 133 mmol/L — ABNORMAL LOW (ref 135–145)

## 2022-12-05 LAB — TROPONIN I (HIGH SENSITIVITY)
Troponin I (High Sensitivity): 8 ng/L (ref <18)
Troponin I (High Sensitivity): 7 ng/L (ref <18)

## 2022-12-05 MED ORDER — IPRATROPIUM-ALBUTEROL 0.5-2.5 (3) MG/3ML IN SOLN
3.0000 mL | Freq: Once | RESPIRATORY_TRACT | Status: AC
  Administered 2022-12-05: 3 mL via RESPIRATORY_TRACT
  Filled 2022-12-05: qty 3

## 2022-12-05 MED ORDER — PREDNISONE 20 MG PO TABS
40.0000 mg | ORAL_TABLET | Freq: Once | ORAL | Status: AC
  Administered 2022-12-05: 40 mg via ORAL
  Filled 2022-12-05: qty 2

## 2022-12-05 MED ORDER — PREDNISONE 10 MG PO TABS: 40.0000 mg | ORAL_TABLET | Freq: Every day | ORAL | 0 refills | Status: DC

## 2022-12-05 MED ORDER — IOHEXOL 350 MG/ML SOLN
75.0000 mL | Freq: Once | INTRAVENOUS | Status: AC | PRN
  Administered 2022-12-05: 75 mL via INTRAVENOUS

## 2022-12-05 MED ORDER — METHYLPREDNISOLONE SODIUM SUCC 125 MG IJ SOLR: 125.0000 mg | Freq: Once | INTRAMUSCULAR | Status: DC

## 2022-12-05 NOTE — ED Triage Notes (Signed)
Pt BIB [facility] coming from home. Pt presents with ShOB that started today. Pt has used albuterol inhaler without relief. EMS admin 5mg  of albuterol, 0.5mg  of atrovent, and 125mg  of solumedrol.   EMS Vitals  106/92 SpO2 initially at 87% on R/A and 100% after treatments HR 103 CBG 134

## 2022-12-05 NOTE — ED Provider Notes (Signed)
[facility] Provider Note   CSN: [id number] Arrival date & time: 12/05/22  1604     History {Add pertinent medical, surgical, social history, OB history to HPI:1} Chief Complaint  Patient presents with   Shortness of Breath    [name] [name] is a 72 y.o. male.  HPI Patient which he does have history of COPD.  He awakened this morning more short of breath than typical.  He tried his inhalers and nebulizer therapy without relief.  He denies he was having associated chest pain.  He has not had fever or productive cough.  Last steroid course greater than several months ago per his recall.  He reports once EMS administered DuoNeb and Solu-Medrol he started to feel better.  Report of EMS visit oxygen saturation was 87% on room air.  Patient denies any use of home oxygen.  Patient did have a left lower extremity surgery where electively some bone repairs were done and he has been wearing a cast for about a month.  He denies any pain swelling or numbness.    Home Medications Prior to Admission medications   Medication Sig Start Date End Date Taking? Authorizing Provider  acetaminophen 325 MG tablet Take 2 tablets (650 mg total) by mouth every 6 (six) hours as needed. Patient taking differently: Take 650 mg by mouth every 6 (six) hours as needed for pain. 12/28/19   Maczis, Barth Kirks, PA-C  albuterol (PROVENTIL) (2.5 MG/3ML) 0.083% nebulizer solution Take 3 mLs (2.5 mg total) by nebulization every 4 (four) hours as needed for wheezing or shortness of breath. 07/28/22   Tanda Rockers, MD  albuterol (VENTOLIN HFA) 108 (90 Base) MCG/ACT inhaler INHALE 2 PUFFS INTO THE LUNGS EVERY 6 HOURS AS NEEDED 08/05/20   Tanda Rockers, MD  cetirizine (ZYRTEC) 10 MG tablet Take 10 mg by mouth daily as needed for allergies.  04/10/19   [provider]  Ciclopirox 0.77 % gel Apply 1 Application topically daily as needed. 02/10/22   [provider]   ciprofloxacin (CIPRO) 500 MG tablet Take 500 mg by mouth 2 (two) times daily. 07/28/22   [provider]  cyclobenzaprine (FLEXERIL) 10 MG tablet Take 10 mg by mouth daily as needed for muscle spasms. 08/12/20   [provider]  finasteride (PROSCAR) 5 MG tablet Take 5 mg by mouth daily.    [provider]  fluticasone (FLONASE) 50 MCG/ACT nasal spray Place 2 sprays into both nostrils daily as needed for allergies.     [provider]  guaiFENesin (MUCINEX) 600 MG 12 hr tablet Take 1 tablet (600 mg total) by mouth 2 (two) times daily as needed for to loosen phlegm. Patient taking differently: Take 600 mg by mouth daily. 12/28/19   Maczis, Barth Kirks, PA-C  hydrochlorothiazide (HYDRODIURIL) 25 MG tablet Take 1 tablet (25 mg total) by mouth daily. 05/30/18   Lavina Hamman, MD  ibuprofen (ADVIL) 800 MG tablet Take 1 tablet (800 mg total) by mouth every 8 (eight) hours as needed. 05/24/20   Kinsinger, Arta Bruce, MD  losartan (COZAAR) 25 MG tablet Take 25 mg by mouth at bedtime. 01/17/21   [provider]  montelukast (SINGULAIR) 10 MG tablet TAKE 1 TABLET(10 MG) BY MOUTH DAILY 11/02/22   Tanda Rockers, MD  Multiple Vitamin (MULTIVITAMIN) tablet Take 1 tablet by mouth daily.    [provider]  pantoprazole (PROTONIX) 40 MG tablet TAKE ONE TABLET BY MOUTH BEFORE BREAKFAST (  [place]) 03/11/22   Tanda Rockers, MD  predniSONE (DELTASONE) 10 MG tablet 2 daily until better then 1 daily  x one week then 1/2 daily Patient not taking: Reported on 09/22/2022 05/08/22   Tanda Rockers, MD  sildenafil (VIAGRA) 100 MG tablet Take 100 mg by mouth as needed for erectile dysfunction. 06/28/20   [provider]  sodium chloride (OCEAN) 0.65 % nasal spray Place 1 spray into the nose as needed for congestion. 10/21/20   [provider]  sodium chloride 0.9 % nebulizer solution Take 3-4 mLs by nebulization daily as needed (Use 3-4 drops into Trach  to loosen phlegm suction secretions immediatly after.). 08/17/19   [provider]  SYMBICORT 160-4.5 MCG/ACT inhaler INHALE TWO BY MOUTH PUFFS FIRST THING IN THE MORNING, THEN REPEAT 12 HOURS LATER 05/07/22   Tanda Rockers, MD  terazosin (HYTRIN) 5 MG capsule Take 5 mg by mouth at bedtime. 02/08/19   [provider]      Allergies    Patient has no known allergies.    Review of Systems   Review of Systems  Physical Exam Updated Vital Signs BP 116/65 (BP Location: Right Arm)   Pulse (!) 106   Temp 98.5 F (36.9 C) (Oral)   Resp (!) 28   Ht 5\' 5"  (1.651 m)   Wt 74.8 kg   SpO2 100%   BMI 27.46 kg/m  Physical Exam Constitutional:      Comments: Patient is alert nontoxic.  No respiratory distress at rest.  HENT:     Mouth/Throat:     Pharynx: Oropharynx is clear.  Eyes:     Extraocular Movements: Extraocular movements intact.  Neck:     Comments: Patient has a permanent tracheostomy Cardiovascular:     Rate and Rhythm: Normal rate and regular rhythm.  Pulmonary:     Comments: No significant increased work of breathing at rest.  Patient does have diffuse expiratory wheeze.  Adequate airflow to bases. Abdominal:     General: There is no distension.     Palpations: Abdomen is soft.     Tenderness: There is no abdominal tenderness. There is no guarding.  Musculoskeletal:        General: No swelling. Normal range of motion.     Comments: Patient has a cast on his left lower extremity.  The toes are warm and dry.  The knee above the cast is not swollen.  Right lower extremity no significant edema.  Skin:    General: Skin is warm and dry.  Neurological:     General: No focal deficit present.     Mental Status: He is oriented to person, place, and time.     Coordination: Coordination normal.  Psychiatric:        Mood and Affect: Mood normal.     ED Results / Procedures / Treatments   Labs (all labs ordered are listed, but only abnormal results are  displayed) Labs Reviewed  BASIC METABOLIC PANEL - Abnormal; Notable for the following components:      Result Value   Sodium 133 (*)    Glucose, Bld 111 (*)    All other components within normal limits  CBC  BRAIN NATRIURETIC PEPTIDE  TROPONIN I (HIGH SENSITIVITY)    EKG EKG Interpretation  Date/Time:  Saturday December 05 2022 16:24:54 EDT Ventricular Rate:  103 PR Interval:  159 QRS Duration: 86 QT Interval:  351 QTC Calculation: 460 R Axis:   80  Text Interpretation: Sinus tachycardia Probable left atrial enlargement Anteroseptal infarct, old no STEMI. NO SIG CHANGE FROM PREVIOUS Confirmed by Charlesetta Shanks [phone]) on 12/05/2022 4:41:03 PM  Radiology DG Chest 2 View  Result Date: 12/05/2022 CLINICAL DATA:  Shortness of breath.  COPD. EXAM: CHEST - 2 VIEW COMPARISON:  03/29/2022 FINDINGS: The heart size and mediastinal contours are within normal limits. Tracheostomy tube remains in place. Both lungs are clear. Focal eventration of left hemidiaphragm is stable. Old fracture deformities of several left ribs and left clavicle again seen. IMPRESSION: No active cardiopulmonary disease. Electronically Signed   By: Marlaine Hind M.D.   On: 12/05/2022 17:28    Procedures Procedures  {Document cardiac monitor, telemetry assessment procedure when appropriate:1}  Medications Ordered in ED Medications  ipratropium-albuterol (DUONEB) 0.5-2.5 (3) MG/3ML nebulizer solution 3 mL (3 mLs Nebulization Given 12/05/22 1659)    ED Course/ Medical Decision Making/ A&P   {   Click here for ABCD2, HEART and other calculatorsREFRESH Note before signing :1}                          Medical Decision Making Amount and/or Complexity of Data Reviewed Labs: ordered. Radiology: ordered.  Risk Prescription drug management.   ***  {Document critical care time when appropriate:1} {Document review of labs and clinical decision tools ie heart score, Chads2Vasc2 etc:1}  {Document your independent review  of radiology images, and any outside records:1} {Document your discussion with family members, caretakers, and with consultants:1} {Document social determinants of health affecting pt's care:1} {Document your decision making why or why not admission, treatments were needed:1} Final Clinical Impression(s) / ED Diagnoses Final diagnoses:  None    Rx / DC Orders ED Discharge Orders     None

## 2022-12-05 NOTE — ED Notes (Signed)
Pt provided discharge instructions and prescription information. Pt was given the opportunity to ask questions and questions were answered.   

## 2022-12-05 NOTE — Discharge Instructions (Addendum)
1.  You have been given prednisone in the emergency department.  Fill your prescription tomorrow morning and start taking as prescribed. 2.  Use your albuterol nebulizer machine every 4 hours as needed for coughing and wheezing. 3.  Follow-up with your doctor or your pulmonologist within the next 3 to 5 days for recheck. 4.  Return to the emergency department immediately if you are getting worsening shortness of breath, chest pain or other concerning changes.

## 2022-12-07 ENCOUNTER — Emergency Department (HOSPITAL_COMMUNITY): Payer: No Typology Code available for payment source

## 2022-12-07 ENCOUNTER — Emergency Department (HOSPITAL_COMMUNITY)
Admission: EM | Admit: 2022-12-07 | Discharge: 2022-12-07 | Disposition: A | Discharge status: Home / Self Care | Payer: No Typology Code available for payment source | Attending: Emergency Medicine | Admitting: Emergency Medicine

## 2022-12-07 DIAGNOSIS — Z79899 Other long term (current) drug therapy: Secondary | ICD-10-CM | POA: Insufficient documentation

## 2022-12-07 DIAGNOSIS — Z7952 Long term (current) use of systemic steroids: Secondary | ICD-10-CM | POA: Insufficient documentation

## 2022-12-07 DIAGNOSIS — Z7951 Long term (current) use of inhaled steroids: Secondary | ICD-10-CM | POA: Diagnosis not present

## 2022-12-07 DIAGNOSIS — J441 Chronic obstructive pulmonary disease with (acute) exacerbation: Secondary | ICD-10-CM | POA: Insufficient documentation

## 2022-12-07 DIAGNOSIS — Z430 Encounter for attention to tracheostomy: Secondary | ICD-10-CM | POA: Diagnosis not present

## 2022-12-07 DIAGNOSIS — J45909 Unspecified asthma, uncomplicated: Secondary | ICD-10-CM | POA: Diagnosis not present

## 2022-12-07 DIAGNOSIS — I10 Essential (primary) hypertension: Secondary | ICD-10-CM | POA: Diagnosis not present

## 2022-12-07 DIAGNOSIS — R0602 Shortness of breath: Secondary | ICD-10-CM | POA: Diagnosis present

## 2022-12-07 MED ORDER — PREDNISONE 20 MG PO TABS
40.0000 mg | ORAL_TABLET | Freq: Once | ORAL | Status: AC
  Administered 2022-12-07: 40 mg via ORAL
  Filled 2022-12-07: qty 2

## 2022-12-07 MED ORDER — IPRATROPIUM-ALBUTEROL 0.5-2.5 (3) MG/3ML IN SOLN
3.0000 mL | RESPIRATORY_TRACT | Status: DC
  Administered 2022-12-07: 3 mL via RESPIRATORY_TRACT
  Filled 2022-12-07: qty 3

## 2022-12-07 NOTE — ED Provider Notes (Signed)
[facility] DEPT Provider Note: Georgena Spurling, MD, FACEP  CSN: [id number] MRN: [id number] ARRIVAL: 12/07/22 at [place]: [place]  12/07/22 5:28 AM [name] [name] is a 72 y.o. male with a history of COPD and tracheostomy.  He was seen in the ED 2 days ago for increased shortness of breath.  He had not been getting adequate relief with his inhalers and nebulizer.  He had not been having a fever or productive call.  EMS reported oxygen saturation of 87% on room air.  He is not on home oxygen.  He had recently had a left lower extremity surgery.  CT angio chest showed no evidence of pulmonary embolism and chest x-ray showed no evidence of infiltrate or other acute cardiopulmonary disease.  He was given 2 DuoNeb treatments and felt back to normal and was discharged on prednisone but he has not yet picked up the prescription.  He returns having worsening shortness of breath since about 10 PM yesterday evening.  He was noted to have an oxygen saturation of 88% on arrival with a respiratory rate of 24.  He is not having any chest pain.  Respiratory therapist on duty, who cared for him 2 days ago, suctioning the patient's tracheostomy.  His oxygen saturation increased to 98% and he feels back to his baseline.  He does have some residual wheezing.   Past Medical History:  Diagnosis Date   Abnormal EKG 12/24/11   anteroseptal and lateral ST elevation, felt r/t early repolarization;  Cardiac cath 12/24/11 - normal coronary anatomy, EF 55-65%   Anxiety    Arthritis    Asthma    Bradycardia, sinus 12/24/11   COPD (chronic obstructive pulmonary disease) (HCC)    Dyspnea    H/O tracheostomy    History of alcohol abuse    hospitalized for detox 2002   HTN (hypertension)    Hypotension 12/24/11   in the setting of dehydration    Marijuana use    Pneumonia    Syncope and collapse 12/24/11   2/2 hypotension in the setting of  dehydration   Upper airway cough syndrome     Past Surgical History:  Procedure Laterality Date   BIOPSY  08/20/2020   Procedure: BIOPSY;  Surgeon: Otis Brace, MD;  Location: [facility];  Service: Gastroenterology;;   CALCANEAL OSTEOTOMY Left 10/30/2022   Procedure: CALCANEAL OSTEOTOMY, SPLIT ANTERIOR TIBIAL TENDON TRANSFER, PERINEAL TENDON TRANSFER, Z- LENGTHENING OF POSTERIOR TIBIAL TENDON;  Surgeon: Jobe Igo, DPM;  Location: [facility];  Service: Podiatry;  Laterality: Left;   CARDIAC CATHETERIZATION  12/24/11   normal coronary anatomy, EF 55-65%   CIRCUMCISION  1972   COLONOSCOPY WITH PROPOFOL N/A 02/24/2018   Procedure: COLONOSCOPY WITH PROPOFOL;  Surgeon: Otis Brace, MD;  Location: [facility];  Service: Gastroenterology;  Laterality: N/A;   COLONOSCOPY WITH PROPOFOL N/A 08/20/2020   Procedure: COLONOSCOPY WITH PROPOFOL;  Surgeon: Otis Brace, MD;  Location: [facility];  Service: Gastroenterology;  Laterality: N/A;   CYSTOSCOPY W/ RETROGRADES Bilateral 03/08/2019   Procedure: CYSTOSCOPY WITH RETROGRADE PYELOGRAM;  Surgeon: Alexis Frock, MD;  Location: [facility];  Service: Urology;  Laterality: Bilateral;   CYSTOSCOPY WITH BIOPSY N/A 03/08/2019   Procedure: CYSTOSCOPY WITH BIOPSY WITH FUGERATION;  Surgeon: Alexis Frock, MD;  Location: [facility];  Service: Urology;  Laterality: N/A;   ESOPHAGOGASTRODUODENOSCOPY N/A 10/09/2015   Procedure: ESOPHAGOGASTRODUODENOSCOPY (EGD);  Surgeon: Judeth Horn, MD;  Location: [facility]  ENDOSCOPY;  Service: General;  Laterality: N/A;   IRRIGATION AND DEBRIDEMENT KNEE Right 05/23/2017   Procedure: IRRIGATION AND DEBRIDEMENT KNEE;  Surgeon: Wylene Simmer, MD;  Location: [facility];  Service: Orthopedics;  Laterality: Right;   LACERATION REPAIR  02/2004   arthroscopic debridement of triagular fibrocartilage tear/E-chart; right wrist   LEFT HEART CATHETERIZATION WITH CORONARY ANGIOGRAM N/A 12/24/2011   Procedure: LEFT HEART CATHETERIZATION WITH  CORONARY ANGIOGRAM;  Surgeon: Peter M Martinique, MD;  Location: [facility] CATH LAB;  Service: Cardiovascular;  Laterality: N/A;   PEG PLACEMENT N/A 10/09/2015   Procedure: PERCUTANEOUS ENDOSCOPIC GASTROSTOMY (PEG) PLACEMENT;  Surgeon: Judeth Horn, MD;  Location: [place];  Service: General;  Laterality: N/A;   PEG TUBE REMOVAL     PERCUTANEOUS TRACHEOSTOMY N/A 10/09/2015   Procedure: PERCUTANEOUS TRACHEOSTOMY;  Surgeon: Judeth Horn, MD;  Location: [place];  Service: General;  Laterality: N/A;   POLYPECTOMY  02/24/2018   Procedure: POLYPECTOMY;  Surgeon: Otis Brace, MD;  Location: [facility];  Service: Gastroenterology;;   POLYPECTOMY  08/20/2020   Procedure: POLYPECTOMY;  Surgeon: Otis Brace, MD;  Location: [facility];  Service: Gastroenterology;;   SINUS ENDO WITH FUSION  06/07/2018   SINUS ENDO WITH FUSION Bilateral 06/07/2018   Procedure: SINUS ENDO WITH FUSION;  Surgeon: Melissa Montane, MD;  Location: [place];  Service: ENT;  Laterality: Bilateral;  with fuision   UMBILICAL HERNIA REPAIR N/A 05/24/2020   Procedure: OPEN UMBILICAL HERNIA REPAIR WITH  MESH;  Surgeon: Kinsinger, Arta Bruce, MD;  Location: [place];  Service: General;  Laterality: N/A;    Family History  Problem Relation Age of Onset   COPD Sister    Cancer Sister    Asthma Other     Social History   Tobacco Use   Smoking status: Never    Passive exposure: Never   Smokeless tobacco: Never  Vaping Use   Vaping Use: Never used  Substance Use Topics   Alcohol use: Yes    Alcohol/week: 2.0 standard drinks of alcohol    Types: 2 Cans of beer per week    Comment: twice a week   Drug use: Not Currently    Types: Marijuana    Comment: Last use in August 2019    Prior to Admission medications   Medication Sig Start Date End Date Taking? Authorizing Provider  acetaminophen 325 MG tablet Take 2 tablets (650 mg total) by mouth every 6 (six) hours as needed. Patient taking differently: Take 650 mg by mouth every 6 (six)  hours as needed for pain. 12/28/19   Maczis, Barth Kirks, PA-C  albuterol (PROVENTIL) (2.5 MG/3ML) 0.083% nebulizer solution Take 3 mLs (2.5 mg total) by nebulization every 4 (four) hours as needed for wheezing or shortness of breath. 07/28/22   Tanda Rockers, MD  albuterol (VENTOLIN HFA) 108 (90 Base) MCG/ACT inhaler INHALE 2 PUFFS INTO THE LUNGS EVERY 6 HOURS AS NEEDED 08/05/20   Tanda Rockers, MD  cetirizine (ZYRTEC) 10 MG tablet Take 10 mg by mouth daily as needed for allergies.  04/10/19   [provider]  Ciclopirox 0.77 % gel Apply 1 Application topically daily as needed. 02/10/22   [provider]  ciprofloxacin (CIPRO) 500 MG tablet Take 500 mg by mouth 2 (two) times daily. 07/28/22   [provider]  cyclobenzaprine (FLEXERIL) 10 MG tablet Take 10 mg by mouth daily as needed for muscle spasms. 08/12/20   [provider]  finasteride (PROSCAR) 5 MG tablet Take 5 mg  by mouth daily.    [provider]  fluticasone (FLONASE) 50 MCG/ACT nasal spray Place 2 sprays into both nostrils daily as needed for allergies.     [provider]  guaiFENesin (MUCINEX) 600 MG 12 hr tablet Take 1 tablet (600 mg total) by mouth 2 (two) times daily as needed for to loosen phlegm. Patient taking differently: Take 600 mg by mouth daily. 12/28/19   Maczis, Barth Kirks, PA-C  hydrochlorothiazide (HYDRODIURIL) 25 MG tablet Take 1 tablet (25 mg total) by mouth daily. 05/30/18   Lavina Hamman, MD  ibuprofen (ADVIL) 800 MG tablet Take 1 tablet (800 mg total) by mouth every 8 (eight) hours as needed. 05/24/20   Kinsinger, Arta Bruce, MD  losartan (COZAAR) 25 MG tablet Take 25 mg by mouth at bedtime. 01/17/21   [provider]  montelukast (SINGULAIR) 10 MG tablet TAKE 1 TABLET(10 MG) BY MOUTH DAILY 11/02/22   Tanda Rockers, MD  Multiple Vitamin (MULTIVITAMIN) tablet Take 1 tablet by mouth daily.    [provider]  pantoprazole (PROTONIX) 40 MG tablet TAKE  ONE TABLET BY MOUTH BEFORE BREAKFAST (30 MINUTES BEFORE) 03/11/22   Tanda Rockers, MD  predniSONE (DELTASONE) 10 MG tablet 2 daily until better then 1 daily  x one week then 1/2 daily Patient not taking: Reported on 09/22/2022 05/08/22   Tanda Rockers, MD  predniSONE (DELTASONE) 10 MG tablet Take 4 tablets (40 mg total) by mouth daily. 12/05/22   Charlesetta Shanks, MD  sildenafil (VIAGRA) 100 MG tablet Take 100 mg by mouth as needed for erectile dysfunction. 06/28/20   [provider]  sodium chloride (OCEAN) 0.65 % nasal spray Place 1 spray into the nose as needed for congestion. 10/21/20   [provider]  sodium chloride 0.9 % nebulizer solution Take 3-4 mLs by nebulization daily as needed (Use 3-4 drops into Trach to loosen phlegm suction secretions immediatly after.). 08/17/19   [provider]  SYMBICORT 160-4.5 MCG/ACT inhaler INHALE TWO BY MOUTH PUFFS FIRST THING IN THE MORNING, THEN REPEAT 12 HOURS LATER 05/07/22   Tanda Rockers, MD  terazosin (HYTRIN) 5 MG capsule Take 5 mg by mouth at bedtime. 02/08/19   [provider]    Allergies Patient has no known allergies.   REVIEW OF SYSTEMS  Negative except as noted here or in the History of Present Illness.   PHYSICAL EXAMINATION  Initial Vital Signs Blood pressure 127/71, pulse 93, temperature 97.8 F (36.6 C), temperature source Oral, resp. rate (!) 23, SpO2 96 %.  Examination General: Well-developed, well-nourished male in no acute distress; appearance consistent with age of record HENT: normocephalic; atraumatic Eyes: Arcus senilis bilaterally Neck: supple; tracheostomy Heart: regular rate and rhythm Lungs: Faint expiratory wheezes Abdomen: soft; nondistended; nontender; bowel sounds present Extremities: Left lower extremity in orthotic device Neurologic: Awake, alert and oriented; motor function intact in all extremities and symmetric; no facial droop Skin: Warm and dry Psychiatric: Normal  mood and affect   RESULTS  Summary of this visit's results, reviewed and interpreted by myself:   EKG Interpretation  Date/Time:    Ventricular Rate:    PR Interval:    QRS Duration:   QT Interval:    QTC Calculation:   R Axis:     Text Interpretation:         Laboratory Studies: No results found for this or any previous visit (from the past 24 hour(s)). Imaging Studies: CT Angio Chest PE W/Cm &/Or  Wo Cm  Result Date: 12/05/2022 CLINICAL DATA:  Concern for pulmonary embolism. EXAM: CT ANGIOGRAPHY CHEST WITH CONTRAST TECHNIQUE: Multidetector CT imaging of the chest was performed using the standard protocol during bolus administration of intravenous contrast. Multiplanar CT image reconstructions and MIPs were obtained to evaluate the vascular anatomy. RADIATION DOSE REDUCTION: This exam was performed according to the departmental dose-optimization program which includes automated exposure control, adjustment of the mA and/or kV according to patient size and/or use of iterative reconstruction technique. CONTRAST:  55mL OMNIPAQUE IOHEXOL 350 MG/ML SOLN COMPARISON:  Chest CT dated 12/23/2019. Chest radiograph dated 12/05/2022. FINDINGS: Evaluation of this exam is limited due to respiratory motion artifact. Cardiovascular: There is no cardiomegaly or pericardial effusion. Mild atherosclerotic calcification of the thoracic aorta. No aneurysmal dilatation or dissection. The origins of the great vessels of the aortic arch appear patent. Evaluation of the pulmonary arteries is limited due to respiratory motion and suboptimal visualization of the peripheral branches. No central pulmonary artery embolus identified. Mediastinum/Nodes: No hilar or mediastinal adenopathy. The esophagus is grossly unremarkable. No mediastinal fluid collection. Lungs/Pleura: No focal consolidation, pleural effusion, or pneumothorax. The central airways are patent. Tracheostomy with tip approximately 4.5 cm above the carina.  Upper Abdomen: No acute abnormality. Musculoskeletal: No acute osseous pathology. Review of the MIP images confirms the above findings. IMPRESSION: 1. No acute intrathoracic pathology. No CT evidence of central pulmonary artery embolus. 2.  Aortic Atherosclerosis (ICD10-I70.0). Electronically Signed   By: Anner Crete M.D.   On: 12/05/2022 19:46   DG Chest 2 View  Result Date: 12/05/2022 CLINICAL DATA:  Shortness of breath.  COPD. EXAM: CHEST - 2 VIEW COMPARISON:  03/29/2022 FINDINGS: The heart size and mediastinal contours are within normal limits. Tracheostomy tube remains in place. Both lungs are clear. Focal eventration of left hemidiaphragm is stable. Old fracture deformities of several left ribs and left clavicle again seen. IMPRESSION: No active cardiopulmonary disease. Electronically Signed   By: Marlaine Hind M.D.   On: 12/05/2022 17:28    ED COURSE and MDM  Nursing notes, initial and subsequent vitals signs, including pulse oximetry, reviewed and interpreted by myself.  Vitals:   12/07/22 0505 12/07/22 0511 12/07/22 0524 12/07/22 0541  BP: 127/71     Pulse: (!) 108  93   Resp: (!) 24  (!) 23   Temp:  97.8 F (36.6 C)    TempSrc:  Oral    SpO2: (!) 88%  96% 100%   Medications  ipratropium-albuterol (DUONEB) 0.5-2.5 (3) MG/3ML nebulizer solution 3 mL (3 mLs Nebulization Given 12/07/22 0539)  predniSONE (DELTASONE) tablet 40 mg (has no administration in time range)   We will provide the patient with a DuoNeb treatment and give him today's dose of prednisone as he has not yet picked up his prescription.  He states he is ready to go home without further diagnostic studies.   PROCEDURES  Procedures   ED DIAGNOSES     ICD-10-CM   1. COPD exacerbation ([place])  J44.1     2. Tracheostomy care [facility])  Z43.0          Shourya Macpherson, Jenny Reichmann, MD 12/07/22 [phone]

## 2022-12-07 NOTE — ED Triage Notes (Signed)
Patient arrived stating that since 10pm he has had shortness of breath. Hx of COPD, trach in place. Declines any pain.

## 2022-12-07 NOTE — ED Notes (Addendum)
Respiratory called for patient trach assessment.

## 2022-12-07 NOTE — Progress Notes (Signed)
Trach site looks clean, inner canula rinsed and cleaned, suction pt and obtain small amount, HOB sheet and ambu bag in place. We would need to order spare trach for the room if pt gets admitted. Pt is at RA at home and would like to stay like that. O2 saturation RA @ 95-96 %. No resp distress noted.

## 2022-12-09 DIAGNOSIS — K219 Gastro-esophageal reflux disease without esophagitis: Secondary | ICD-10-CM | POA: Diagnosis not present

## 2022-12-09 DIAGNOSIS — I10 Essential (primary) hypertension: Secondary | ICD-10-CM | POA: Diagnosis not present

## 2022-12-09 DIAGNOSIS — E785 Hyperlipidemia, unspecified: Secondary | ICD-10-CM | POA: Diagnosis not present

## 2022-12-09 DIAGNOSIS — J449 Chronic obstructive pulmonary disease, unspecified: Secondary | ICD-10-CM | POA: Diagnosis not present

## 2022-12-10 DIAGNOSIS — J441 Chronic obstructive pulmonary disease with (acute) exacerbation: Secondary | ICD-10-CM | POA: Diagnosis not present

## 2022-12-11 DIAGNOSIS — Q667 Congenital pes cavus, unspecified foot: Secondary | ICD-10-CM | POA: Diagnosis not present

## 2022-12-11 DIAGNOSIS — Q6689 Other  specified congenital deformities of feet: Secondary | ICD-10-CM | POA: Diagnosis not present

## 2022-12-16 ENCOUNTER — Inpatient Hospital Stay: Payer: 59 | Admitting: Primary Care
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
## 2022-12-23 DIAGNOSIS — Z430 Encounter for attention to tracheostomy: Secondary | ICD-10-CM | POA: Diagnosis not present

## 2023-01-07 DIAGNOSIS — Z930 Tracheostomy status: Secondary | ICD-10-CM | POA: Diagnosis not present

## 2023-01-07 DIAGNOSIS — J324 Chronic pansinusitis: Secondary | ICD-10-CM | POA: Diagnosis not present

## 2023-01-13 DIAGNOSIS — I10 Essential (primary) hypertension: Secondary | ICD-10-CM | POA: Diagnosis not present

## 2023-01-13 DIAGNOSIS — K219 Gastro-esophageal reflux disease without esophagitis: Secondary | ICD-10-CM | POA: Diagnosis not present

## 2023-01-13 DIAGNOSIS — E785 Hyperlipidemia, unspecified: Secondary | ICD-10-CM | POA: Diagnosis not present

## 2023-01-13 DIAGNOSIS — J449 Chronic obstructive pulmonary disease, unspecified: Secondary | ICD-10-CM | POA: Diagnosis not present

## 2023-01-25 DIAGNOSIS — Z430 Encounter for attention to tracheostomy: Secondary | ICD-10-CM | POA: Diagnosis not present

## 2023-02-01 DIAGNOSIS — Z930 Tracheostomy status: Secondary | ICD-10-CM | POA: Diagnosis not present

## 2023-02-09 ENCOUNTER — Encounter (HOSPITAL_COMMUNITY): Payer: Self-pay

## 2023-02-09 ENCOUNTER — Other Ambulatory Visit: Payer: Self-pay

## 2023-02-09 ENCOUNTER — Emergency Department (HOSPITAL_COMMUNITY): Payer: No Typology Code available for payment source

## 2023-02-09 ENCOUNTER — Emergency Department (HOSPITAL_COMMUNITY)
Admission: EM | Admit: 2023-02-09 | Discharge: 2023-02-09 | Disposition: A | Discharge status: Home / Self Care | Payer: No Typology Code available for payment source | Attending: Emergency Medicine | Admitting: Emergency Medicine

## 2023-02-09 DIAGNOSIS — N50811 Right testicular pain: Secondary | ICD-10-CM | POA: Diagnosis not present

## 2023-02-09 DIAGNOSIS — Z79899 Other long term (current) drug therapy: Secondary | ICD-10-CM | POA: Insufficient documentation

## 2023-02-09 DIAGNOSIS — I10 Essential (primary) hypertension: Secondary | ICD-10-CM | POA: Diagnosis not present

## 2023-02-09 DIAGNOSIS — R1030 Lower abdominal pain, unspecified: Secondary | ICD-10-CM | POA: Diagnosis present

## 2023-02-09 DIAGNOSIS — N3001 Acute cystitis with hematuria: Secondary | ICD-10-CM | POA: Insufficient documentation

## 2023-02-09 LAB — URINALYSIS, ROUTINE W REFLEX MICROSCOPIC
Bilirubin Urine: NEGATIVE
Glucose, UA: NEGATIVE mg/dL
Ketones, ur: NEGATIVE mg/dL
Nitrite: POSITIVE — AB
Protein, ur: NEGATIVE mg/dL
Specific Gravity, Urine: 1.017 (ref 1.005–1.030)
pH: 5 (ref 5.0–8.0)

## 2023-02-09 LAB — CBC WITH DIFFERENTIAL/PLATELET
Abs Immature Granulocytes: 0.03 10*3/uL (ref 0.00–0.07)
Basophils Absolute: 0.1 10*3/uL (ref 0.0–0.1)
Basophils Relative: 1 %
Eosinophils Absolute: 0.3 10*3/uL (ref 0.0–0.5)
Eosinophils Relative: 7 %
HCT: 36.8 % — ABNORMAL LOW (ref 39.0–52.0)
Hemoglobin: 12.5 g/dL — ABNORMAL LOW (ref 13.0–17.0)
Immature Granulocytes: 1 %
Lymphocytes Relative: 20 %
Lymphs Abs: 0.8 10*3/uL (ref 0.7–4.0)
MCH: 30.9 pg (ref 26.0–34.0)
MCHC: 34 g/dL (ref 30.0–36.0)
MCV: 91.1 fL (ref 80.0–100.0)
Monocytes Absolute: 0.5 10*3/uL (ref 0.1–1.0)
Monocytes Relative: 11 %
Neutro Abs: 2.6 10*3/uL (ref 1.7–7.7)
Neutrophils Relative %: 60 %
Platelets: 318 10*3/uL (ref 150–400)
RBC: 4.04 MIL/uL — ABNORMAL LOW (ref 4.22–5.81)
RDW: 13.9 % (ref 11.5–15.5)
WBC: 4.3 10*3/uL (ref 4.0–10.5)
nRBC: 0 % (ref 0.0–0.2)

## 2023-02-09 LAB — COMPREHENSIVE METABOLIC PANEL
ALT: 26 U/L (ref 0–44)
AST: 24 U/L (ref 15–41)
Albumin: 3.3 g/dL — ABNORMAL LOW (ref 3.5–5.0)
Alkaline Phosphatase: 119 U/L (ref 38–126)
Anion gap: 9 (ref 5–15)
BUN: 13 mg/dL (ref 8–23)
CO2: 24 mmol/L (ref 22–32)
Calcium: 8.7 mg/dL — ABNORMAL LOW (ref 8.9–10.3)
Chloride: 103 mmol/L (ref 98–111)
Creatinine, Ser: 0.61 mg/dL (ref 0.61–1.24)
GFR, Estimated: >60 mL/min (ref >60)
Glucose, Bld: 107 mg/dL — ABNORMAL HIGH (ref 70–99)
Potassium: 3.5 mmol/L (ref 3.5–5.1)
Sodium: 136 mmol/L (ref 135–145)
Total Bilirubin: 0.7 mg/dL (ref 0.3–1.2)
Total Protein: 7.4 g/dL (ref 6.5–8.1)

## 2023-02-09 MED ORDER — IOHEXOL 300 MG/ML  SOLN
100.0000 mL | Freq: Once | INTRAMUSCULAR | Status: AC | PRN
  Administered 2023-02-09: 100 mL via INTRAVENOUS

## 2023-02-09 MED ORDER — SODIUM CHLORIDE 0.9 % IV SOLN
1.0000 g | Freq: Once | INTRAVENOUS | Status: AC
  Administered 2023-02-09: 1 g via INTRAVENOUS
  Filled 2023-02-09: qty 10

## 2023-02-09 MED ORDER — CEPHALEXIN 500 MG PO CAPS: 500.0000 mg | ORAL_CAPSULE | Freq: Four times a day (QID) | ORAL | 0 refills | Status: DC

## 2023-02-09 NOTE — ED Provider Notes (Signed)
[place] EMERGENCY DEPARTMENT AT [facility] Provider Note   CSN: [id number] Arrival date & time: 02/09/23  1024     History  Chief Complaint  Patient presents with   Groin Pain   Testicle Pain    [name] [name] is a 72 y.o. 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  Patient states pain has slightly improved.  Denies any urinary symptoms.  Patient self catheterizes himself 3 times daily due to BPH. Normal urine output over the past few days. Not currently sexually active.  Denies penile discharge.  No concern for STIs.  Denies abdominal pain.   History obtained from patient and past medical records. No interpreter used during encounter.       Home Medications Prior to Admission medications   Medication Sig Start Date End Date Taking? Authorizing Provider  cephALEXin (KEFLEX) 500 MG capsule Take 1 capsule (500 mg total) by mouth 4 (four) times daily. 02/09/23  Yes Mannie Stabile, PA-C  acetaminophen 325 MG tablet Take 2 tablets (650 mg total) by mouth every 6 (six) hours as needed. Patient taking differently: Take 650 mg by mouth every 6 (six) hours as needed for pain. 12/28/19   Maczis, Elmer Sow, PA-C  albuterol (PROVENTIL) (2.5 MG/3ML) 0.083% nebulizer solution Take 3 mLs (2.5 mg total) by nebulization every 4 (four) hours as needed for wheezing or shortness of breath. 07/28/22   Nyoka Cowden, MD  albuterol (VENTOLIN HFA) 108 (90 Base) MCG/ACT inhaler INHALE 2 PUFFS INTO THE LUNGS EVERY 6 HOURS AS NEEDED 08/05/20   Nyoka Cowden, MD  cetirizine (ZYRTEC) 10 MG tablet Take 10 mg by mouth daily as needed for allergies.  04/10/19   [provider]  Ciclopirox 0.77 % gel Apply 1 Application topically daily as needed. 02/10/22   [provider]  ciprofloxacin  (CIPRO) 500 MG tablet Take 500 mg by mouth 2 (two) times daily. 07/28/22   [provider]  cyclobenzaprine (FLEXERIL) 10 MG tablet Take 10 mg by mouth daily as needed for muscle spasms. 08/12/20   [provider]  finasteride (PROSCAR) 5 MG tablet Take 5 mg by mouth daily.    [provider]  fluticasone (FLONASE) 50 MCG/ACT nasal spray Place 2 sprays into both nostrils daily as needed for allergies.     [provider]  guaiFENesin (MUCINEX) 600 MG 12 hr tablet Take 1 tablet (600 mg total) by mouth 2 (two) times daily as needed for to loosen phlegm. Patient taking differently: Take 600 mg by mouth daily. 12/28/19   Maczis, Elmer Sow, PA-C  hydrochlorothiazide (HYDRODIURIL) 25 MG tablet Take 1 tablet (25 mg total) by mouth daily. 05/30/18   Rolly Salter, MD  ibuprofen (ADVIL) 800 MG tablet Take 1 tablet (800 mg total) by mouth every 8 (eight) hours as needed. 05/24/20   Kinsinger, De Blanch, MD  losartan (COZAAR) 25 MG tablet Take 25 mg by mouth at bedtime. 01/17/21   [provider]  montelukast (SINGULAIR) 10 MG tablet TAKE 1 TABLET(10 MG) BY MOUTH DAILY 11/02/22   Nyoka Cowden, MD  Multiple Vitamin (MULTIVITAMIN) tablet Take 1 tablet by mouth daily.    [provider]  pantoprazole (PROTONIX) 40 MG tablet TAKE ONE TABLET BY MOUTH BEFORE BREAKFAST (30 MINUTES BEFORE) 03/11/22   Wert,  Charlaine Dalton, MD  predniSONE (DELTASONE) 10 MG tablet 2 daily until better then 1 daily  x one week then 1/2 daily Patient not taking: Reported on 09/22/2022 05/08/22   Nyoka Cowden, MD  predniSONE (DELTASONE) 10 MG tablet Take 4 tablets (40 mg total) by mouth daily. 12/05/22   Arby Barrette, MD  sildenafil (VIAGRA) 100 MG tablet Take 100 mg by mouth as needed for erectile dysfunction. 06/28/20   [provider]  sodium chloride (OCEAN) 0.65 % nasal spray Place 1 spray into the nose as needed for congestion. 10/21/20   [provider]  sodium  chloride 0.9 % nebulizer solution Take 3-4 mLs by nebulization daily as needed (Use 3-4 drops into Trach to loosen phlegm suction secretions immediatly after.). 08/17/19   [provider]  SYMBICORT 160-4.5 MCG/ACT inhaler INHALE TWO BY MOUTH PUFFS FIRST THING IN THE MORNING, THEN REPEAT 12 HOURS LATER 05/07/22   Nyoka Cowden, MD  terazosin (HYTRIN) 5 MG capsule Take 5 mg by mouth at bedtime. 02/08/19   [provider]      Allergies    Patient has no known allergies.    Review of Systems   Review of Systems  Gastrointestinal:  Negative for abdominal pain.  Genitourinary:  Positive for scrotal swelling and testicular pain. Negative for dysuria, penile discharge, penile pain and penile swelling.    Physical Exam Updated Vital Signs BP 121/80   Pulse (!) 57   Temp 97.6 F (36.4 C) (Oral)   Resp 18   Ht 5\' 5"  (1.651 m)   Wt 66.2 kg   SpO2 99%   BMI 24.30 kg/m  Physical Exam Vitals and nursing note reviewed.  Constitutional:      General: He is not in acute distress.    Appearance: He is not ill-appearing.  HENT:     Head: Normocephalic.  Eyes:     Pupils: Pupils are equal, round, and reactive to light.  Cardiovascular:     Rate and Rhythm: Normal rate and regular rhythm.     Pulses: Normal pulses.     Heart sounds: Normal heart sounds. No murmur heard.    No friction rub. No gallop.  Pulmonary:     Effort: Pulmonary effort is normal.     Breath sounds: Normal breath sounds.  Abdominal:     General: Abdomen is flat. There is no distension.     Palpations: Abdomen is soft.     Tenderness: There is no abdominal tenderness. There is no guarding or rebound.  Genitourinary:    Comments: GU exam performed with chaperone in room.  Significant right testicular edema.  Mild tenderness. No penile discharge or lesions. Musculoskeletal:        General: Normal range of motion.     Cervical back: Neck supple.  Skin:    General: Skin is warm and dry.   Neurological:     General: No focal deficit present.     Mental Status: He is alert.  Psychiatric:        Mood and Affect: Mood normal.        Behavior: Behavior normal.     ED Results / Procedures / Treatments   Labs (all labs ordered are listed, but only abnormal results are displayed) Labs Reviewed  CBC WITH DIFFERENTIAL/PLATELET - Abnormal; Notable for the following components:      Result Value   RBC 4.04 (*)    Hemoglobin 12.5 (*)    HCT 36.8 (*)  All other components within normal limits  COMPREHENSIVE METABOLIC PANEL - Abnormal; Notable for the following components:   Glucose, Bld 107 (*)    Calcium 8.7 (*)    Albumin 3.3 (*)    All other components within normal limits  URINALYSIS, ROUTINE W REFLEX MICROSCOPIC - Abnormal; Notable for the following components:   Hgb urine dipstick SMALL (*)    Nitrite POSITIVE (*)    Leukocytes,Ua TRACE (*)    Bacteria, UA RARE (*)    All other components within normal limits    EKG None  Radiology CT ABDOMEN PELVIS W CONTRAST  Result Date: 02/09/2023 CLINICAL DATA:  Right groin pain with testicular swelling and tenderness on palpation for 4 days. Urinary frequency. EXAM: CT ABDOMEN AND PELVIS WITH CONTRAST TECHNIQUE: Multidetector CT imaging of the abdomen and pelvis was performed using the standard protocol following bolus administration of intravenous contrast. RADIATION DOSE REDUCTION: This exam was performed according to the departmental dose-optimization program which includes automated exposure control, adjustment of the mA and/or kV according to patient size and/or use of iterative reconstruction technique. CONTRAST:  OMNIPAQUE IOHEXOL 300 MG/ML  SOLN COMPARISON:  CT 12/23/2019 and older FINDINGS: Lower chest: Linear opacity lung bases likely scar or atelectasis. No pleural effusion. Small hiatal hernia. Hepatobiliary: Gallbladder is nondilated. Minimal ectasia of the biliary tree, nonspecific. No space-occupying liver  lesion. Patent portal vein. Pancreas: Unremarkable. No pancreatic ductal dilatation or surrounding inflammatory changes. Spleen: Normal in size without focal abnormality. Adrenals/Urinary Tract: The adrenal glands are preserved. No enhancing renal mass or collecting system dilatation. There are some tiny low-attenuation foci along each kidney which are too small to completely characterize but likely benign cysts. Bosniak 2 lesions. No specific imaging follow-up. These were seen on the study of 2020. distended bladder with a dome right-sided bladder diverticulum. Stomach/Bowel: There is some moderate debris in the stomach. On this non oral contrast exam the small and large bowel are nondilated with scattered stool. Normal appendix in the right lower quadrant. Vascular/Lymphatic: Mild scattered vascular calcifications identified along the aorta and branch vessels. Normal caliber aorta and IVC. No developing abnormal lymph node enlargement identified in the abdomen and pelvis. Reproductive: Heterogeneous prostate with mass effect. Please correlate with patient's PSA. Scrotal fluid again identified at the edge of the imaging field. Please correlate with patient's separate scrotal ultrasound. Other: No free air or free fluid. No bowel containing inguinal hernia identified. Musculoskeletal: Scattered degenerative changes of the spine and pelvis. There is small sclerotic focus along the L3 vertebral level near the margin of the pedicle. This a differential but very well could be a bone island. This was seen on the study of 2020 but slightly more prominent. IMPRESSION: No bowel obstruction, free air or free fluid. No bowel containing inguinal hernia. Enlarged prostate with heterogeneous enhancement. Please correlate with patient's PSA. Mass effect along the bladder as well. Moderate size bladder diverticulum. Small sclerotic focus involving the L3 vertebral body near the margin of the left pedicle. This a differential. This  is slightly larger than the study of 2021 and 2020. Please correlate for any known history and if needed follow up bone scan. Electronically Signed   By: Karen Kays M.D.   On: 02/09/2023 14:49   US SCROTUM W/DOPPLER  Result Date: 02/09/2023 CLINICAL DATA:  Testicular pain for 4 days EXAM: SCROTAL ULTRASOUND DOPPLER ULTRASOUND OF THE TESTICLES TECHNIQUE: Complete ultrasound examination of the testicles, epididymis, and other scrotal structures was performed. Color and spectral Doppler  ultrasound were also utilized to evaluate blood flow to the testicles. COMPARISON:  None Available. FINDINGS: Right testicle Measurements: 3.9 x 2.5 x 3.1 cm. No mass or microlithiasis visualized. Left testicle Measurements: 4.0 x 2.1 x 2.6 cm. No mass or microlithiasis visualized. Adjacent to the left testicle is a 13 x 9 x 6 mm cystic focus without blood flow. Nonspecific. This has separate from the left epididymis. Right epididymis:  Normal in size and appearance. Left epididymis:  Normal in size and appearance. Hydrocele:  None visualized. Varicocele:  None visualized. Pulsed Doppler interrogation of both testes demonstrates normal low resistance arterial and venous waveforms bilaterally. IMPRESSION: Preserved blood flow to the testicles.  Preserved echotexture. 13 mm benign-appearing cystic structure in the tissues adjacent to the left testicle separate from the left epididymis, nonspecific but benign in appearance. Electronically Signed   By: Karen Kays M.D.   On: 02/09/2023 11:56    Procedures Procedures    Medications Ordered in ED Medications  cefTRIAXone (ROCEPHIN) 1 g in sodium chloride 0.9 % 100 mL IVPB (1 g Intravenous New Bag/Given 02/09/23 1401)  iohexol (OMNIPAQUE) 300 MG/ML solution 100 mL (100 mLs Intravenous Contrast Given 02/09/23 1432)    ED Course/ Medical Decision Making/ A&P                             Medical Decision Making Amount and/or Complexity of Data Reviewed Labs: ordered.  Decision-making details documented in ED Course. Radiology: ordered and independent interpretation performed. Decision-making details documented in ED Course.  Risk Prescription drug management.   This patient presents to the ED for concern of testicular pain, this involves an extensive number of treatment options, and is a complaint that carries with it a high risk of complications and morbidity.  The differential diagnosis includes testicular torsion, epididymitis, STI, hernia, etc  72 year old male presents to the ED due to right testicular pain and edema x 4 days.  History of BPH and self catheterizes himself roughly 3-4 times daily.  No change in output over the past 4 days.  Not sexually active.  No concern for STIs.  No fever or chills.  Upon arrival, vitals all within normal limits.  Patient in no acute distress.  Physical exam significant for mild right testicular tenderness.  Significant right testicular edema.  No penile discharge or lesions.  Scrotal ultrasound ordered to rule out testicular torsion versus infection.  Routine labs ordered.  UA ordered.  CBC significant for anemia with hemoglobin 12.5.  No leukocytosis.  CMP reassuring.  Normal renal function.  No major electrolyte derangements.  UA positive for nitrites and leukocytes.  IV Rocephin given for acute cystitis.  Ultrasound with blood flow to both testicles.  Suspicion for testicular torsion.  Does show a benign appearing cystic structure in the left testicle.  Given patient's symptoms are on the right we will obtain CT abdomen out evidence of hernia or other etiologies of testicular edema and pain. Discussed with Dr. Criss Alvine who agrees with assessment and plan.   Reassessed patient, he notes his pain is a 2/10. Patient resting comfortably in bed. Awaiting CT abdomen/pelvis.   CT abdomen personally reviewed and interpreted which demonstrates enlarged prostate with mass effect along the bladder.  Patient has known BPH.  Already  performs self-catheterization daily.  Advised patient to follow-up with his urologist for further evaluation.  Also demonstrates a moderate-sized bladder diverticulum.  Diverticulum apparent in previous CT scan.  Also  demonstrates a small sclerotic focus involving L3 vertebral body present in previous CT scan; however slightly larger in size. Advised patient to follow-up with PCP for further evaluation. Strict ED precautions discussed with patient. Patient states understanding and agrees to plan. Patient discharged home in no acute distress and stable vitals  Has PCP Hx BPH  Discussed with Dr. Criss Alvine who agrees with assessment and plan.        Final Clinical Impression(s) / ED Diagnoses Final diagnoses:  Right testicular pain  Acute cystitis with hematuria    Rx / DC Orders ED Discharge Orders          Ordered    cephALEXin (KEFLEX) 500 MG capsule  4 times daily        02/09/23 1410              Jesusita Oka 02/09/23 1459    Pricilla Loveless, MD 02/12/23 [phone]

## 2023-02-09 NOTE — ED Triage Notes (Signed)
C/o right groin pain with right testicular swelling and tender on palpitation x4 days.  Also c/o urinary frequency.

## 2023-02-09 NOTE — Discharge Instructions (Addendum)
It was a pleasure taking care of you today.  As discussed, your ultrasound showed a cystic structure in your left testicle. Your urine was concerning for a UTI.  I am sending home with antibiotic.  Take as prescribed and finish all antibiotics. I have included the number of the urologist. Call to schedule an appointment for further evaluation. Return to the ER for new or worsening symptoms.   Your CT scan showed a lesion in your lumbar vertebra that has been there in previous scans. Please follow-up with PCP for further evaluation.  Your prostate is enlarge. Follow-up with your urologist for further evaluation.

## 2023-02-12 DIAGNOSIS — N390 Urinary tract infection, site not specified: Secondary | ICD-10-CM | POA: Diagnosis not present

## 2023-02-12 DIAGNOSIS — R937 Abnormal findings on diagnostic imaging of other parts of musculoskeletal system: Secondary | ICD-10-CM | POA: Diagnosis not present

## 2023-02-19 ENCOUNTER — Emergency Department (HOSPITAL_COMMUNITY)
Admission: EM | Admit: 2023-02-19 | Discharge: 2023-02-19 | Disposition: A | Discharge status: Home / Self Care | Payer: No Typology Code available for payment source | Attending: Emergency Medicine | Admitting: Emergency Medicine

## 2023-02-19 ENCOUNTER — Other Ambulatory Visit: Payer: Self-pay

## 2023-02-19 ENCOUNTER — Emergency Department (HOSPITAL_COMMUNITY): Payer: No Typology Code available for payment source

## 2023-02-19 DIAGNOSIS — R059 Cough, unspecified: Secondary | ICD-10-CM | POA: Diagnosis not present

## 2023-02-19 DIAGNOSIS — R0602 Shortness of breath: Secondary | ICD-10-CM | POA: Insufficient documentation

## 2023-02-19 DIAGNOSIS — Z430 Encounter for attention to tracheostomy: Secondary | ICD-10-CM | POA: Diagnosis not present

## 2023-02-19 DIAGNOSIS — R0600 Dyspnea, unspecified: Secondary | ICD-10-CM | POA: Diagnosis not present

## 2023-02-19 LAB — CBC
HCT: 37.5 % — ABNORMAL LOW (ref 39.0–52.0)
Hemoglobin: 12.9 g/dL — ABNORMAL LOW (ref 13.0–17.0)
MCH: 31.8 pg (ref 26.0–34.0)
MCHC: 34.4 g/dL (ref 30.0–36.0)
MCV: 92.4 fL (ref 80.0–100.0)
Platelets: 280 10*3/uL (ref 150–400)
RBC: 4.06 MIL/uL — ABNORMAL LOW (ref 4.22–5.81)
RDW: 14.3 % (ref 11.5–15.5)
WBC: 5.6 10*3/uL (ref 4.0–10.5)
nRBC: 0 % (ref 0.0–0.2)

## 2023-02-19 LAB — BASIC METABOLIC PANEL
Anion gap: 7 (ref 5–15)
BUN: 14 mg/dL (ref 8–23)
CO2: 25 mmol/L (ref 22–32)
Calcium: 8.5 mg/dL — ABNORMAL LOW (ref 8.9–10.3)
Chloride: 103 mmol/L (ref 98–111)
Creatinine, Ser: 0.63 mg/dL (ref 0.61–1.24)
GFR, Estimated: >60 mL/min (ref >60)
Glucose, Bld: 101 mg/dL — ABNORMAL HIGH (ref 70–99)
Potassium: 3 mmol/L — ABNORMAL LOW (ref 3.5–5.1)
Sodium: 135 mmol/L (ref 135–145)

## 2023-02-19 MED ORDER — METHYLPREDNISOLONE SODIUM SUCC 125 MG IJ SOLR
125.0000 mg | Freq: Once | INTRAMUSCULAR | Status: AC
  Administered 2023-02-19: 125 mg via INTRAVENOUS
  Filled 2023-02-19: qty 2

## 2023-02-19 MED ORDER — ALBUTEROL SULFATE (2.5 MG/3ML) 0.083% IN NEBU
5.0000 mg | INHALATION_SOLUTION | Freq: Once | RESPIRATORY_TRACT | Status: AC
  Administered 2023-02-19: 5 mg via RESPIRATORY_TRACT
  Filled 2023-02-19: qty 6

## 2023-02-19 NOTE — ED Provider Notes (Signed)
[place] EMERGENCY DEPARTMENT AT [facility] Provider Note   CSN: [id number] Arrival date & time: 02/19/23  4098     History  Chief Complaint  Patient presents with   Shortness of Breath   Cough    Anita Bocook is a 72 y.o. male.  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  He states he has been trying to suction it out himself for several days but still feels like it is plugged up.  He denies any change in baseline cough, fever, chills, nausea, vomiting.  He states he otherwise feels like he is at his baseline.     Home Medications Prior to Admission medications   Medication Sig Start Date End Date Taking? Authorizing Provider  acetaminophen 325 MG tablet Take 2 tablets (650 mg total) by mouth every 6 (six) hours as needed. Patient taking differently: Take 650 mg by mouth every 6 (six) hours as needed for pain. 12/28/19   Maczis, Elmer Sow, PA-C  albuterol (PROVENTIL) (2.5 MG/3ML) 0.083% nebulizer solution Take 3 mLs (2.5 mg total) by nebulization every 4 (four) hours as needed for wheezing or shortness of breath. 07/28/22   Nyoka Cowden, MD  albuterol (VENTOLIN HFA) 108 (90 Base) MCG/ACT inhaler INHALE 2 PUFFS INTO THE LUNGS EVERY 6 HOURS AS NEEDED 08/05/20   Nyoka Cowden, MD  cephALEXin (KEFLEX) 500 MG capsule Take 1 capsule (500 mg total) by mouth 4 (four) times daily. 02/09/23   Mannie Stabile, PA-C  cetirizine (ZYRTEC) 10 MG tablet Take 10 mg by mouth daily as needed for allergies.  04/10/19   [provider]  Ciclopirox 0.77 % gel Apply 1 Application topically daily as needed. 02/10/22   [provider]  ciprofloxacin (CIPRO) 500 MG tablet Take 500 mg by mouth 2 (two) times daily. 07/28/22   [provider]  cyclobenzaprine (FLEXERIL) 10 MG tablet Take 10 mg by mouth daily as needed for muscle spasms. 08/12/20   [provider]  finasteride (PROSCAR) 5 MG tablet Take 5 mg by mouth daily.    [provider]  fluticasone (FLONASE) 50 MCG/ACT nasal spray Place 2 sprays into both nostrils daily as needed for allergies.     [provider]  guaiFENesin (MUCINEX) 600 MG 12 hr tablet Take 1 tablet (600 mg total) by mouth 2 (two) times daily as needed for to loosen phlegm. Patient taking differently: Take 600 mg by mouth daily. 12/28/19   Maczis, Elmer Sow, PA-C  hydrochlorothiazide (HYDRODIURIL) 25 MG tablet Take 1 tablet (25 mg total) by mouth daily. 05/30/18   Rolly Salter, MD  ibuprofen (ADVIL) 800 MG tablet Take 1 tablet (800 mg total) by mouth every 8 (eight) hours as needed. 05/24/20   Kinsinger, De Blanch, MD  losartan (COZAAR) 25 MG tablet Take 25 mg by mouth at bedtime. 01/17/21   [provider]  montelukast (SINGULAIR) 10 MG tablet TAKE 1 TABLET(10 MG) BY MOUTH DAILY 11/02/22   Nyoka Cowden, MD  Multiple Vitamin (MULTIVITAMIN) tablet Take 1 tablet by mouth daily.    [provider]  pantoprazole (PROTONIX) 40 MG tablet TAKE ONE TABLET BY MOUTH BEFORE BREAKFAST (30 MINUTES BEFORE) 03/11/22   Nyoka Cowden, MD  predniSONE (DELTASONE) 10 MG tablet 2 daily until better then 1 daily  x one week then 1/2 daily Patient not taking: Reported on 09/22/2022 05/08/22   Sherene Sires,  Charlaine Dalton, MD  predniSONE (DELTASONE) 10 MG tablet Take 4 tablets (40 mg total) by mouth daily. 12/05/22   Arby Barrette, MD  sildenafil (VIAGRA) 100 MG tablet Take 100 mg by mouth as needed for erectile dysfunction. 06/28/20   [provider]  sodium chloride (OCEAN) 0.65 % nasal spray Place 1 spray into the nose as needed for congestion. 10/21/20   [provider]  sodium chloride 0.9 % nebulizer solution Take 3-4 mLs by nebulization daily as needed (Use 3-4 drops into Trach to loosen phlegm suction secretions immediatly after.). 08/17/19   [provider]  SYMBICORT 160-4.5 MCG/ACT  inhaler INHALE TWO BY MOUTH PUFFS FIRST THING IN THE MORNING, THEN REPEAT 12 HOURS LATER 05/07/22   Nyoka Cowden, MD  terazosin (HYTRIN) 5 MG capsule Take 5 mg by mouth at bedtime. 02/08/19   [provider]      Allergies    Patient has no known allergies.    Review of Systems   Review of Systems  Physical Exam Updated Vital Signs BP 126/78   Pulse 74   Temp 98.4 F (36.9 C) (Oral)   Resp (!) 24   Ht 1.651 m (5\' 5" )   Wt 66.2 kg   SpO2 99%   BMI 24.29 kg/m  Physical Exam Vitals reviewed.  HENT:     Head: Normocephalic.     Mouth/Throat:     Mouth: Mucous membranes are moist.  Eyes:     Pupils: Pupils are equal, round, and reactive to light.  Cardiovascular:     Rate and Rhythm: Normal rate and regular rhythm.  Pulmonary:     Comments: Patient initially with some tachypnea and upper airway sounds He may have some mild expiratory wheezes but is difficult to tell with the upper airway sounds superimposed Chest:     Chest wall: No mass.  Abdominal:     Palpations: Abdomen is soft.  Musculoskeletal:        General: Normal range of motion.     Cervical back: Normal range of motion.  Skin:    Capillary Refill: Capillary refill takes less than 2 seconds.  Neurological:     General: No focal deficit present.     Mental Status: He is alert.  Psychiatric:        Mood and Affect: Mood normal.     ED Results / Procedures / Treatments   Labs (all labs ordered are listed, but only abnormal results are displayed) Labs Reviewed  CBC - Abnormal; Notable for the following components:      Result Value   RBC 4.06 (*)    Hemoglobin 12.9 (*)    HCT 37.5 (*)    All other components within normal limits  BASIC METABOLIC PANEL - Abnormal; Notable for the following components:   Potassium 3.0 (*)    Glucose, Bld 101 (*)    Calcium 8.5 (*)    All other components within normal limits    EKG None  Radiology DG Chest Port 1 View  Result Date:  02/19/2023 CLINICAL DATA:  Cough and dyspnea EXAM: PORTABLE CHEST 1 VIEW COMPARISON:  12/05/2022 FINDINGS: Tracheostomy tube in place. There is no edema, consolidation, effusion, or pneumothorax. Normal heart size and mediastinal contours for technique. IMPRESSION: Stable exam.  No evidence of acute disease. Electronically Signed   By: Tiburcio Pea M.D.   On: 02/19/2023 07:45    Procedures .Critical Care  Performed by: Margarita Grizzle, MD Authorized by: Margarita Grizzle, MD  Critical care provider statement:    Critical care time (minutes):  30   Critical care end time:  02/19/2023 8:54 AM   Critical care was time spent personally by me on the following activities:  Development of treatment plan with patient or surrogate, discussions with consultants, evaluation of patient's response to treatment, examination of patient, ordering and review of laboratory studies, ordering and review of radiographic studies, ordering and performing treatments and interventions, pulse oximetry, re-evaluation of patient's condition and review of old charts     Medications Ordered in ED Medications  methylPREDNISolone sodium succinate (SOLU-MEDROL) 125 mg/2 mL injection 125 mg (125 mg Intravenous Given 02/19/23 0804)  albuterol (PROVENTIL) (2.5 MG/3ML) 0.083% nebulizer solution 5 mg (5 mg Nebulization Given 02/19/23 4098)    ED Course/ Medical Decision Making/ A&P Clinical Course as of 02/19/23 0854  Fri Feb 19, 2023  0803 Chest x-Ami Mally reviewed interpreted with no acute abnormalities and radiologist interpretation concurs [DR]  0848 Basic metabolic panel is reviewed and interpreted and shows mild hypokalemia with a potassium of 3 CBC reviewed and interpreted and within normal limits [DR]    Clinical Course User Index [DR] Margarita Grizzle, MD                             Medical Decision Making Amount and/or Complexity of Data Reviewed Labs: ordered. Radiology: ordered.  Risk Prescription drug  management.   72 year old male presents today feeling like he is having some plugging of his trach.  Respiratory therapy has suctioned him States that he feels that it was somewhat dry.  They did get some plugs out.  Patient feels like he is back to baseline at this time. Patient was additionally evaluated with labs and with chest x-Giuliana Handyside.  These appear normal.  He has mild hypokalemia and is advised regarding oral repletion and need for follow-up. Patient is observed and oxygen saturations and respiratory rate have normalized.  Blood pressure and heart rate remain normal. Patient appears stable for discharge       Final Clinical Impression(s) / ED Diagnoses Final diagnoses:  Tracheostomy care [facility])    Rx / DC Orders ED Discharge Orders     None         Margarita Grizzle, MD 02/19/23 [id number]

## 2023-02-19 NOTE — Discharge Instructions (Signed)
Please continue your home trach care as before. Return if you are having any worsening symptoms especially fever or change in ability to breathe

## 2023-02-19 NOTE — ED Triage Notes (Signed)
Pt arrived via POV. C/O SOB and cough for 1x week with increase in phlegm. Pt has trach and home suctioning has made little improvement    AOx4

## 2023-02-19 NOTE — Progress Notes (Signed)
Provided ordered albuterol treatment. PT suctioned times 2 - resulted in small, white, thick ,frothy sputum. PT uses HME button at home, states he feels really dry (medical air ATC administered). At this time PT states to MD and RT that he is breathing better and has no other needs at this time.

## 2023-02-22 ENCOUNTER — Encounter (HOSPITAL_COMMUNITY): Payer: Self-pay

## 2023-02-22 ENCOUNTER — Emergency Department (HOSPITAL_COMMUNITY): Payer: No Typology Code available for payment source

## 2023-02-22 ENCOUNTER — Other Ambulatory Visit: Payer: Self-pay

## 2023-02-22 ENCOUNTER — Emergency Department (HOSPITAL_COMMUNITY)
Admission: EM | Admit: 2023-02-22 | Discharge: 2023-02-22 | Disposition: A | Discharge status: Home / Self Care | Payer: No Typology Code available for payment source | Attending: Emergency Medicine | Admitting: Emergency Medicine

## 2023-02-22 DIAGNOSIS — J441 Chronic obstructive pulmonary disease with (acute) exacerbation: Secondary | ICD-10-CM | POA: Insufficient documentation

## 2023-02-22 DIAGNOSIS — J45909 Unspecified asthma, uncomplicated: Secondary | ICD-10-CM | POA: Insufficient documentation

## 2023-02-22 DIAGNOSIS — Z430 Encounter for attention to tracheostomy: Secondary | ICD-10-CM | POA: Diagnosis not present

## 2023-02-22 DIAGNOSIS — R0602 Shortness of breath: Secondary | ICD-10-CM | POA: Diagnosis present

## 2023-02-22 LAB — CBC WITH DIFFERENTIAL/PLATELET
Abs Immature Granulocytes: 0.01 10*3/uL (ref 0.00–0.07)
Basophils Absolute: 0.1 10*3/uL (ref 0.0–0.1)
Basophils Relative: 1 %
Eosinophils Absolute: 0.9 10*3/uL — ABNORMAL HIGH (ref 0.0–0.5)
Eosinophils Relative: 14 %
HCT: 41.6 % (ref 39.0–52.0)
Hemoglobin: 13.7 g/dL (ref 13.0–17.0)
Immature Granulocytes: 0 %
Lymphocytes Relative: 23 %
Lymphs Abs: 1.6 10*3/uL (ref 0.7–4.0)
MCH: 30.7 pg (ref 26.0–34.0)
MCHC: 32.9 g/dL (ref 30.0–36.0)
MCV: 93.3 fL (ref 80.0–100.0)
Monocytes Absolute: 0.7 10*3/uL (ref 0.1–1.0)
Monocytes Relative: 10 %
Neutro Abs: 3.7 10*3/uL (ref 1.7–7.7)
Neutrophils Relative %: 52 %
Platelets: 324 10*3/uL (ref 150–400)
RBC: 4.46 MIL/uL (ref 4.22–5.81)
RDW: 14.3 % (ref 11.5–15.5)
WBC: 7 10*3/uL (ref 4.0–10.5)
nRBC: 0 % (ref 0.0–0.2)

## 2023-02-22 LAB — BASIC METABOLIC PANEL
Anion gap: 10 (ref 5–15)
BUN: 18 mg/dL (ref 8–23)
CO2: 25 mmol/L (ref 22–32)
Calcium: 8.7 mg/dL — ABNORMAL LOW (ref 8.9–10.3)
Chloride: 102 mmol/L (ref 98–111)
Creatinine, Ser: 0.95 mg/dL (ref 0.61–1.24)
GFR, Estimated: >60 mL/min (ref >60)
Glucose, Bld: 114 mg/dL — ABNORMAL HIGH (ref 70–99)
Potassium: 3.5 mmol/L (ref 3.5–5.1)
Sodium: 137 mmol/L (ref 135–145)

## 2023-02-22 MED ORDER — IPRATROPIUM-ALBUTEROL 0.5-2.5 (3) MG/3ML IN SOLN
3.0000 mL | Freq: Once | RESPIRATORY_TRACT | Status: AC
  Administered 2023-02-22: 3 mL via RESPIRATORY_TRACT
  Filled 2023-02-22: qty 3

## 2023-02-22 MED ORDER — AMOXICILLIN-POT CLAVULANATE 875-125 MG PO TABS: 1.0000 | ORAL_TABLET | Freq: Two times a day (BID) | ORAL | 0 refills | Status: AC

## 2023-02-22 MED ORDER — PREDNISONE 10 MG PO TABS: 20.0000 mg | ORAL_TABLET | Freq: Every day | ORAL | 0 refills | Status: DC

## 2023-02-22 NOTE — Discharge Instructions (Signed)
Please pick up the antibiotics and steroids I have prescribed you.  You most likely have a COPD exacerbation from a viral illness.  Please follow-up with your primary care provider regarding recent symptoms and ER visit.  If symptoms are to change or worsen please return to ER.

## 2023-02-22 NOTE — ED Triage Notes (Signed)
Pt. Arrives POV c/o SOB Since this morning. Pt. States that he suctioned his own trach this morning and still has not been able to get his breathing together.

## 2023-02-22 NOTE — Progress Notes (Signed)
RT was called to suction pts trach. [name] [name] is very clean. Site looks great. Suctioned pt and he is on RA WS WNL.

## 2023-02-22 NOTE — ED Notes (Signed)
Respiratory called and at bedside.  

## 2023-02-22 NOTE — ED Provider Notes (Signed)
[place] EMERGENCY DEPARTMENT AT [facility] Provider Note   CSN: [id number] Arrival date & time: 02/22/23  7829     History  Chief Complaint  Patient presents with   Shortness of Breath    [name] [name] is a 72 y.o. male history of tracheostomy, COPD, COPD, asthma presented shortness of breath that began this morning.  Patient was recently seen 02/19/2023 to have his trach cleaned in which he felt better afterwards.  Patient thought he was having the same exacerbation and came to have his trach cleaned again however after respiratory sob and got minimal fluid out of the trach still feels plugged up.  Patient does note he has history of COPD and feels this may be an exacerbation.  Patient states he is coughing up white mucus.  Patient does not require oxygen at home.  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  Patient denied chest pain, nausea/vomiting, fever, abdominal pain, headache, vision changes, history of blood clots, recent travel, personal history of cancer  Home Medications Prior to Admission medications   Medication Sig Start Date End Date Taking? Authorizing Provider  amoxicillin-clavulanate (AUGMENTIN) 875-125 MG tablet Take 1 tablet by mouth every 12 (twelve) hours for 5 days. 02/22/23 02/27/23 Yes Rikayla Demmon, Beverly Gust, PA-C  predniSONE (DELTASONE) 10 MG tablet Take 2 tablets (20 mg total) by mouth daily. 02/22/23  Yes Netta Corrigan, PA-C  acetaminophen 325 MG tablet Take 2 tablets (650 mg total) by mouth every 6 (six) hours as needed. Patient taking differently: Take 650 mg by mouth every 6 (six) hours as needed for pain. 12/28/19   Maczis, Elmer Sow, PA-C  albuterol (PROVENTIL) (2.5 MG/3ML) 0.083% nebulizer solution Take 3 mLs (2.5 mg total) by nebulization every 4 (four) hours as needed for wheezing or shortness of breath. 07/28/22   Nyoka Cowden, MD  albuterol (VENTOLIN HFA) 108 (90 Base) MCG/ACT inhaler INHALE 2 PUFFS  INTO THE LUNGS EVERY 6 HOURS AS NEEDED 08/05/20   Nyoka Cowden, MD  cephALEXin (KEFLEX) 500 MG capsule Take 1 capsule (500 mg total) by mouth 4 (four) times daily. 02/09/23   Mannie Stabile, PA-C  cetirizine (ZYRTEC) 10 MG tablet Take 10 mg by mouth daily as needed for allergies.  04/10/19   [provider]  Ciclopirox 0.77 % gel Apply 1 Application topically daily as needed. 02/10/22   [provider]  ciprofloxacin (CIPRO) 500 MG tablet Take 500 mg by mouth 2 (two) times daily. 07/28/22   [provider]  cyclobenzaprine (FLEXERIL) 10 MG tablet Take 10 mg by mouth daily as needed for muscle spasms. 08/12/20   [provider]  finasteride (PROSCAR) 5 MG tablet Take 5 mg by mouth daily.    [provider]  fluticasone (FLONASE) 50 MCG/ACT nasal spray Place 2 sprays into both nostrils daily as needed for allergies.     [provider]  guaiFENesin (MUCINEX) 600 MG 12 hr tablet Take 1 tablet (600 mg total) by mouth 2 (two) times daily as needed for to loosen phlegm. Patient taking differently: Take 600 mg by mouth daily. 12/28/19   Maczis, Elmer Sow, PA-C  hydrochlorothiazide (HYDRODIURIL) 25 MG tablet Take 1 tablet (25 mg total) by mouth daily. 05/30/18   Rolly Salter, MD  ibuprofen (ADVIL) 800 MG tablet Take 1 tablet (800 mg total) by mouth every 8 (eight) hours as needed. 05/24/20   Kinsinger, De Blanch, MD  losartan (COZAAR) 25  MG tablet Take 25 mg by mouth at bedtime. 01/17/21   [provider]  montelukast (SINGULAIR) 10 MG tablet TAKE 1 TABLET(10 MG) BY MOUTH DAILY 11/02/22   Nyoka Cowden, MD  Multiple Vitamin (MULTIVITAMIN) tablet Take 1 tablet by mouth daily.    [provider]  pantoprazole (PROTONIX) 40 MG tablet TAKE ONE TABLET BY MOUTH BEFORE BREAKFAST (30 MINUTES BEFORE) 03/11/22   Nyoka Cowden, MD  predniSONE (DELTASONE) 10 MG tablet 2 daily until better then 1 daily  x one week then 1/2 daily Patient not  taking: Reported on 09/22/2022 05/08/22   Nyoka Cowden, MD  predniSONE (DELTASONE) 10 MG tablet Take 4 tablets (40 mg total) by mouth daily. 12/05/22   Arby Barrette, MD  sildenafil (VIAGRA) 100 MG tablet Take 100 mg by mouth as needed for erectile dysfunction. 06/28/20   [provider]  sodium chloride (OCEAN) 0.65 % nasal spray Place 1 spray into the nose as needed for congestion. 10/21/20   [provider]  sodium chloride 0.9 % nebulizer solution Take 3-4 mLs by nebulization daily as needed (Use 3-4 drops into Trach to loosen phlegm suction secretions immediatly after.). 08/17/19   [provider]  SYMBICORT 160-4.5 MCG/ACT inhaler INHALE TWO BY MOUTH PUFFS FIRST THING IN THE MORNING, THEN REPEAT 12 HOURS LATER 05/07/22   Nyoka Cowden, MD  terazosin (HYTRIN) 5 MG capsule Take 5 mg by mouth at bedtime. 02/08/19   [provider]      Allergies    Patient has no known allergies.    Review of Systems   Review of Systems  Respiratory:  Positive for shortness of breath.     Physical Exam Updated Vital Signs BP 124/72   Pulse 99   Temp 98.2 F (36.8 C) (Oral)   Resp 18   Ht 5\' 5"  (1.651 m)   Wt 65.8 kg   SpO2 94%   BMI 24.13 kg/m  Physical Exam Constitutional:      General: He is not in acute distress. Neck:     Comments: [name] present Cardiovascular:     Rate and Rhythm: Regular rhythm. Tachycardia present.     Heart sounds: Normal heart sounds.     Comments: 2+ bilateral radial pulses with increased rate Pulmonary:     Effort: No respiratory distress.     Comments: Tachypneic with increased respiratory effort Expiratory wheezing noted bilaterally Abdominal:     Palpations: Abdomen is soft.     Tenderness: There is no abdominal tenderness. There is no guarding or rebound.  Musculoskeletal:     Cervical back: Normal range of motion.     Right lower leg: No edema.     Comments: Boot noted on left lower leg No calf tenderness  bilaterally No calf edema noted bilaterally  Skin:    General: Skin is warm and dry.  Neurological:     Mental Status: He is alert.     ED Results / Procedures / Treatments   Labs (all labs ordered are listed, but only abnormal results are displayed) Labs Reviewed  BASIC METABOLIC PANEL - Abnormal; Notable for the following components:      Result Value   Glucose, Bld 114 (*)    Calcium 8.7 (*)    All other components within normal limits  CBC WITH DIFFERENTIAL/PLATELET - Abnormal; Notable for the following components:   Eosinophils Absolute 0.9 (*)    All other components within normal limits    EKG  EKG Interpretation  Date/Time:  Monday February 22 2023 07:33:46 EDT Ventricular Rate:  91 PR Interval:  161 QRS Duration: 85 QT Interval:  356 QTC Calculation: 438 R Axis:   73 Text Interpretation: Sinus rhythm Anteroseptal infarct, old when compared to prior, similar appearance. No STEMI Confirmed by Theda Belfast ([id number]) on 02/22/2023 8:32:45 AM  Radiology DG Chest 2 View  Result Date: 02/22/2023 CLINICAL DATA:  Shortness of breath. EXAM: CHEST - 2 VIEW COMPARISON:  02/19/2023. FINDINGS: Mild bronchial wall thickening and hazy opacities in the lower lobes, may reflect aspiration or atypical/viral infection. Stable cardiac and mediastinal contours. No pleural effusion or pneumothorax. Old rib fracture deformities along the left upper chest wall. IMPRESSION: Mild bronchial wall thickening and hazy opacities in the lower lobes, may reflect aspiration or atypical/viral infection. Electronically Signed   By: Orvan Falconer M.D.   On: 02/22/2023 07:31    Procedures Procedures    Medications Ordered in ED Medications  ipratropium-albuterol (DUONEB) 0.5-2.5 (3) MG/3ML nebulizer solution 3 mL (has no administration in time range)  ipratropium-albuterol (DUONEB) 0.5-2.5 (3) MG/3ML nebulizer solution 3 mL (3 mLs Nebulization Given 02/22/23 0734)    ED Course/ Medical Decision  Making/ A&P                             Medical Decision Making Amount and/or Complexity of Data Reviewed Labs: ordered. Radiology: ordered.  Risk Prescription drug management.   [name] 72 y.o. presented today for shortness of breath.  Working DDx that I considered at this time includes, but not limited to, asthma/COPD exacerbation, URI, viral illness, anemia, ACS, PE, pneumonia, pleural effusion, lung cancer.  R/o DDx: Asthma exacerbation, URI, anemia, ACS, PE, pneumonia, pleural effusion, lung cancer: These are considered less likely due to history of present illness and physical exam findings  Review of prior external notes: 02/19/2023 ED  Unique Tests and My Interpretation:  CBC: Unremarkable BMP: Unremarkable EKG: Sinus 91 bpm, no ST elevations or blocks noted CXR: Aspiration pneumonia versus viral illness  Discussion with Independent Historian: None  Discussion of Management of Tests: None  Risk: Medium: prescription drug management  Risk Stratification Score:  None  Staffed with Tegeler, MD  Plan: Patient presented for shortness of breath.  On exam patient was in no acute distress and stable vitals however did have increased work of breathing and was tachypneic.  Patient did have tracheostomy present which RT cleaned out but still stated he was short of breath.  On exam patient was wheezing bilaterally.  Patient does have history of COPD and so shortness of breath labs were ordered along with an EKG and chest x-ray.  Chest x-ray showed patient has aspiration pneumonia versus viral illness.  Patient has been afebrile and does not endorse any fevers and so this is most likely viral illness exacerbating his COPD.  Patient was given 1 round of DuoNeb and stated they felt better but still mildly wheezing on recheck so a second DuoNeb was ordered.  Anticipate patient will be discharged antibiotics primary care follow-up.  Patient also states he feels better after he gets  around prednisone and does not have any prednisone at home and so patient will be given prednisone taper as well as antibiotics.  Patient stable at this time.  On recheck after receiving second DuoNeb treatment patient stated he felt much better and is able to breathe clearly.  Patient was no longer wheezing on auscultation and is  stable to be discharged.  Patient be prescribed prednisone and Augmentin encouraged follow-up with his primary care provider.  Patient was given return precautions. Patient stable for discharge at this time.  Patient verbalized understanding of plan.         Final Clinical Impression(s) / ED Diagnoses Final diagnoses:  Tracheostomy care [facility])  COPD exacerbation (HCC)    Rx / DC Orders ED Discharge Orders          Ordered    predniSONE (DELTASONE) 10 MG tablet  Daily        02/22/23 0849    amoxicillin-clavulanate (AUGMENTIN) 875-125 MG tablet  Every 12 hours        02/22/23 0849              Netta Corrigan, PA-C 02/22/23 4098    Tegeler, Canary Brim, MD 02/22/23 1039

## 2023-02-24 DIAGNOSIS — Z430 Encounter for attention to tracheostomy: Secondary | ICD-10-CM | POA: Diagnosis not present

## 2023-03-02 DIAGNOSIS — J324 Chronic pansinusitis: Secondary | ICD-10-CM | POA: Diagnosis not present

## 2023-03-09 ENCOUNTER — Emergency Department (HOSPITAL_COMMUNITY): Payer: No Typology Code available for payment source

## 2023-03-09 ENCOUNTER — Other Ambulatory Visit: Payer: Self-pay

## 2023-03-09 ENCOUNTER — Emergency Department (HOSPITAL_COMMUNITY)
Admission: EM | Admit: 2023-03-09 | Discharge: 2023-03-09 | Disposition: A | Discharge status: Home / Self Care | Payer: No Typology Code available for payment source | Attending: Emergency Medicine | Admitting: Emergency Medicine

## 2023-03-09 ENCOUNTER — Encounter (HOSPITAL_COMMUNITY): Payer: Self-pay

## 2023-03-09 DIAGNOSIS — Z430 Encounter for attention to tracheostomy: Secondary | ICD-10-CM | POA: Diagnosis not present

## 2023-03-09 DIAGNOSIS — R0602 Shortness of breath: Secondary | ICD-10-CM | POA: Diagnosis present

## 2023-03-09 DIAGNOSIS — R918 Other nonspecific abnormal finding of lung field: Secondary | ICD-10-CM | POA: Diagnosis not present

## 2023-03-09 DIAGNOSIS — J441 Chronic obstructive pulmonary disease with (acute) exacerbation: Secondary | ICD-10-CM | POA: Diagnosis not present

## 2023-03-09 DIAGNOSIS — R059 Cough, unspecified: Secondary | ICD-10-CM | POA: Diagnosis not present

## 2023-03-09 LAB — CBC WITH DIFFERENTIAL/PLATELET
Abs Immature Granulocytes: 0.01 10*3/uL (ref 0.00–0.07)
Basophils Absolute: 0.1 10*3/uL (ref 0.0–0.1)
Basophils Relative: 1 %
Eosinophils Absolute: 1.2 10*3/uL — ABNORMAL HIGH (ref 0.0–0.5)
Eosinophils Relative: 23 %
HCT: 41.7 % (ref 39.0–52.0)
Hemoglobin: 14.1 g/dL (ref 13.0–17.0)
Immature Granulocytes: 0 %
Lymphocytes Relative: 19 %
Lymphs Abs: 1 10*3/uL (ref 0.7–4.0)
MCH: 30.8 pg (ref 26.0–34.0)
MCHC: 33.8 g/dL (ref 30.0–36.0)
MCV: 91 fL (ref 80.0–100.0)
Monocytes Absolute: 0.5 10*3/uL (ref 0.1–1.0)
Monocytes Relative: 9 %
Neutro Abs: 2.6 10*3/uL (ref 1.7–7.7)
Neutrophils Relative %: 48 %
Platelets: 239 10*3/uL (ref 150–400)
RBC: 4.58 MIL/uL (ref 4.22–5.81)
RDW: 13.7 % (ref 11.5–15.5)
WBC: 5.4 10*3/uL (ref 4.0–10.5)
nRBC: 0 % (ref 0.0–0.2)

## 2023-03-09 LAB — COMPREHENSIVE METABOLIC PANEL
ALT: 24 U/L (ref 0–44)
AST: 22 U/L (ref 15–41)
Albumin: 3.6 g/dL (ref 3.5–5.0)
Alkaline Phosphatase: 82 U/L (ref 38–126)
Anion gap: 9 (ref 5–15)
BUN: 12 mg/dL (ref 8–23)
CO2: 22 mmol/L (ref 22–32)
Calcium: 8.9 mg/dL (ref 8.9–10.3)
Chloride: 100 mmol/L (ref 98–111)
Creatinine, Ser: 0.72 mg/dL (ref 0.61–1.24)
GFR, Estimated: >60 mL/min (ref >60)
Glucose, Bld: 97 mg/dL (ref 70–99)
Potassium: 3.8 mmol/L (ref 3.5–5.1)
Sodium: 131 mmol/L — ABNORMAL LOW (ref 135–145)
Total Bilirubin: 0.7 mg/dL (ref 0.3–1.2)
Total Protein: 7.1 g/dL (ref 6.5–8.1)

## 2023-03-09 MED ORDER — IPRATROPIUM-ALBUTEROL 0.5-2.5 (3) MG/3ML IN SOLN
3.0000 mL | Freq: Once | RESPIRATORY_TRACT | Status: AC
  Administered 2023-03-09: 3 mL via RESPIRATORY_TRACT
  Filled 2023-03-09: qty 3

## 2023-03-09 MED ORDER — DOXYCYCLINE HYCLATE 100 MG PO CAPS: 100.0000 mg | ORAL_CAPSULE | Freq: Two times a day (BID) | ORAL | 0 refills | Status: DC

## 2023-03-09 MED ORDER — METHYLPREDNISOLONE SODIUM SUCC 125 MG IJ SOLR
125.0000 mg | Freq: Once | INTRAMUSCULAR | Status: AC
  Administered 2023-03-09: 125 mg via INTRAVENOUS
  Filled 2023-03-09: qty 2

## 2023-03-09 MED ORDER — PREDNISONE 20 MG PO TABS: 40.0000 mg | ORAL_TABLET | Freq: Every day | ORAL | 0 refills | Status: AC

## 2023-03-09 NOTE — ED Notes (Signed)
Pt ambulated to wheelchair then was wheeled form ed. Pt verbalized understanding of discharge instructions. Pt IV removed.

## 2023-03-09 NOTE — Discharge Instructions (Addendum)
Please follow-up with your primary care doctor, use your DuoNebs at home, every 6 hours or as needed.  If you start running a fever, have worsening shortness of breath, please return to the ER.  You can also take Mucinex and this may help thin your secretions.  Additionally should drink lots of water.

## 2023-03-09 NOTE — ED Triage Notes (Signed)
Pt c/o increased mucus production starting this morning.  Pt has a trach and was having a hard time breathing.  Pt currently denying complaints.  Sts he "coughed up a bunch of mucus."

## 2023-03-09 NOTE — ED Provider Notes (Signed)
[place] EMERGENCY DEPARTMENT AT [facility] Provider Note   CSN: [id number] Arrival date & time: 03/09/23  1102     History  Chief Complaint  Patient presents with   Shortness of Breath    [name] [name] is a 72 y.o. male, history of COPD, who presents to the ED secondary to increased mucus production x 1 day.  He states that he woke up today and he had increased mucus production, attempted to clean out his trach twice, but has had continued very thick mucus from it.  Denies any shortness of breath associated with this, or increased cough.  States this happens periodically.    Home Medications Prior to Admission medications   Medication Sig Start Date End Date Taking? Authorizing Provider  doxycycline (VIBRAMYCIN) 100 MG capsule Take 1 capsule (100 mg total) by mouth 2 (two) times daily. 03/09/23  Yes Dejane Scheibe L, PA  predniSONE (DELTASONE) 20 MG tablet Take 2 tablets (40 mg total) by mouth daily for 5 days. 03/09/23 03/14/23 Yes Montgomery Rothlisberger L, PA  acetaminophen 325 MG tablet Take 2 tablets (650 mg total) by mouth every 6 (six) hours as needed. Patient taking differently: Take 650 mg by mouth every 6 (six) hours as needed for pain. 12/28/19   Maczis, Elmer Sow, PA-C  albuterol (PROVENTIL) (2.5 MG/3ML) 0.083% nebulizer solution Take 3 mLs (2.5 mg total) by nebulization every 4 (four) hours as needed for wheezing or shortness of breath. 07/28/22   Nyoka Cowden, MD  albuterol (VENTOLIN HFA) 108 (90 Base) MCG/ACT inhaler INHALE 2 PUFFS INTO THE LUNGS EVERY 6 HOURS AS NEEDED 08/05/20   Nyoka Cowden, MD  cephALEXin (KEFLEX) 500 MG capsule Take 1 capsule (500 mg total) by mouth 4 (four) times daily. 02/09/23   Mannie Stabile, PA-C  cetirizine (ZYRTEC) 10 MG tablet Take 10 mg by mouth daily as needed for allergies.  04/10/19   [provider]  Ciclopirox 0.77 % gel Apply 1 Application topically daily as needed. 02/10/22   [provider]  ciprofloxacin  (CIPRO) 500 MG tablet Take 500 mg by mouth 2 (two) times daily. 07/28/22   [provider]  cyclobenzaprine (FLEXERIL) 10 MG tablet Take 10 mg by mouth daily as needed for muscle spasms. 08/12/20   [provider]  finasteride (PROSCAR) 5 MG tablet Take 5 mg by mouth daily.    [provider]  fluticasone (FLONASE) 50 MCG/ACT nasal spray Place 2 sprays into [name] nostrils daily as needed for allergies.     [provider]  guaiFENesin (MUCINEX) 600 MG 12 hr tablet Take 1 tablet (600 mg total) by mouth 2 (two) times daily as needed for to loosen phlegm. Patient taking differently: Take 600 mg by mouth daily. 12/28/19   Maczis, Elmer Sow, PA-C  hydrochlorothiazide (HYDRODIURIL) 25 MG tablet Take 1 tablet (25 mg total) by mouth daily. 05/30/18   Rolly Salter, MD  ibuprofen (ADVIL) 800 MG tablet Take 1 tablet (800 mg total) by mouth every 8 (eight) hours as needed. 05/24/20   Kinsinger, De Blanch, MD  losartan (COZAAR) 25 MG tablet Take 25 mg by mouth at bedtime. 01/17/21   [provider]  montelukast (SINGULAIR) 10 MG tablet TAKE 1 TABLET(10 MG) BY MOUTH DAILY 11/02/22   Nyoka Cowden, MD  Multiple Vitamin (MULTIVITAMIN) tablet Take 1 tablet by mouth daily.    [provider]  pantoprazole (PROTONIX) 40 MG tablet TAKE ONE TABLET BY MOUTH BEFORE BREAKFAST (30  MINUTES BEFORE) 03/11/22   Nyoka Cowden, MD  sildenafil (VIAGRA) 100 MG tablet Take 100 mg by mouth as needed for erectile dysfunction. 06/28/20   [provider]  sodium chloride (OCEAN) 0.65 % nasal spray Place 1 spray into the nose as needed for congestion. 10/21/20   [provider]  sodium chloride 0.9 % nebulizer solution Take 3-4 mLs by nebulization daily as needed (Use 3-4 drops into Trach to loosen phlegm suction secretions immediatly after.). 08/17/19   [provider]  SYMBICORT 160-4.5 MCG/ACT inhaler INHALE TWO BY MOUTH PUFFS FIRST THING IN THE MORNING, THEN  REPEAT 12 HOURS LATER 05/07/22   Nyoka Cowden, MD  terazosin (HYTRIN) 5 MG capsule Take 5 mg by mouth at bedtime. 02/08/19   [provider]      Allergies    Patient has no known allergies.    Review of Systems   Review of Systems  Respiratory:  Positive for shortness of breath.     Physical Exam Updated Vital Signs BP 116/84   Pulse 77   Temp 97.7 F (36.5 C) (Oral)   Resp 18   Ht 5\' 5"  (1.651 m)   Wt 65.8 kg   SpO2 100%   BMI 24.13 kg/m  Physical Exam Vitals and nursing note reviewed.  Constitutional:      General: He is not in acute distress.    Appearance: He is well-developed.  HENT:     Head: Normocephalic and atraumatic.  Eyes:     Conjunctiva/sclera: Conjunctivae normal.  Cardiovascular:     Rate and Rhythm: Normal rate and regular rhythm.     Heart sounds: No murmur heard. Pulmonary:     Effort: Pulmonary effort is normal. No respiratory distress.     Breath sounds: Wheezing present.     Comments: +Trach w/coarse gurgling Abdominal:     Palpations: Abdomen is soft.     Tenderness: There is no abdominal tenderness.  Musculoskeletal:        General: No swelling.     Cervical back: Neck supple.  Skin:    General: Skin is warm and dry.     Capillary Refill: Capillary refill takes less than 2 seconds.  Neurological:     Mental Status: He is alert.  Psychiatric:        Mood and Affect: Mood normal.     ED Results / Procedures / Treatments   Labs (all labs ordered are listed, but only abnormal results are displayed) Labs Reviewed  COMPREHENSIVE METABOLIC PANEL - Abnormal; Notable for the following components:      Result Value   Sodium 131 (*)    All other components within normal limits  CBC WITH DIFFERENTIAL/PLATELET - Abnormal; Notable for the following components:   Eosinophils Absolute 1.2 (*)    All other components within normal limits    EKG None  Radiology DG Chest 2 View  Result Date: 03/09/2023 CLINICAL DATA:  Cough  and increasing mucous. EXAM: CHEST - 2 VIEW COMPARISON:  X-ray 02/22/2023 and older FINDINGS: Stable tracheostomy tube. Multiple old left-sided rib fractures. No consolidation, pneumothorax or effusion. Normal cardiopericardial silhouette without edema. Eventration of the left hemidiaphragm. Mild peribronchial thickening. IMPRESSION: Tracheostomy tube.  Mild peribronchial thickening. Electronically Signed   By: Karen Kays M.D.   On: 03/09/2023 12:08    Procedures Procedures    Medications Ordered in ED Medications  ipratropium-albuterol (DUONEB) 0.5-2.5 (3) MG/3ML nebulizer solution 3 mL (3 mLs Nebulization Given 03/09/23 1244)  methylPREDNISolone sodium succinate (SOLU-MEDROL) 125 mg/2 mL injection 125 mg (125 mg Intravenous Given 03/09/23 1257)    ED Course/ Medical Decision Making/ A&P                             Medical Decision Making Patient is a 72 year old male, history of tracheostomy, COPD, here for increased mucosal production for the last day.  States he has tried to clean out his trach without any relief is having much more sputum.  On exam he has wheezing, and coarse mucosal sounds on his trach.  We have respiratory evaluate, and try to clean out his trach, as well as give him a DuoNeb, and prednisone.  Chest x-ray ordered to evaluate for pneumonia  Amount and/or Complexity of Data Reviewed Labs: ordered.    Details: Labs within normal limits Radiology: ordered.    Details: Mild peribronchial thickening on chest x-ray Discussion of management or test interpretation with external provider(s): Discussed with patient he is feeling much better, wheezing much improved after DuoNeb, Solu-Medrol, we will discharge him home, with prednisone, and doxycycline he already has DuoNebs at home.  We discussed taking Mucinex at home, to help thin his secretions.  He voiced understanding discharged home, with strict return precautions.  Believe secondary to COPD exacerbation given wheezing,  increased mucus production.  Risk Prescription drug management.   Final Clinical Impression(s) / ED Diagnoses Final diagnoses:  COPD exacerbation (HCC)    Rx / DC Orders ED Discharge Orders          Ordered    predniSONE (DELTASONE) 20 MG tablet  Daily        03/09/23 1435    doxycycline (VIBRAMYCIN) 100 MG capsule  2 times daily        03/09/23 1435              Rishard Delange, [place], [place] 03/09/23 1513    Jacalyn Lefevre, MD 03/12/23 1504

## 2023-03-24 ENCOUNTER — Encounter (HOSPITAL_COMMUNITY): Payer: Self-pay

## 2023-03-24 ENCOUNTER — Other Ambulatory Visit: Payer: Self-pay

## 2023-03-24 ENCOUNTER — Emergency Department (HOSPITAL_COMMUNITY): Payer: No Typology Code available for payment source

## 2023-03-24 ENCOUNTER — Emergency Department (HOSPITAL_COMMUNITY)
Admission: EM | Admit: 2023-03-24 | Discharge: 2023-03-24 | Disposition: A | Discharge status: Home / Self Care | Payer: No Typology Code available for payment source | Attending: Emergency Medicine | Admitting: Emergency Medicine

## 2023-03-24 DIAGNOSIS — Z79899 Other long term (current) drug therapy: Secondary | ICD-10-CM | POA: Diagnosis not present

## 2023-03-24 DIAGNOSIS — Z7951 Long term (current) use of inhaled steroids: Secondary | ICD-10-CM | POA: Insufficient documentation

## 2023-03-24 DIAGNOSIS — J441 Chronic obstructive pulmonary disease with (acute) exacerbation: Secondary | ICD-10-CM | POA: Diagnosis not present

## 2023-03-24 DIAGNOSIS — I10 Essential (primary) hypertension: Secondary | ICD-10-CM | POA: Diagnosis not present

## 2023-03-24 DIAGNOSIS — R0602 Shortness of breath: Secondary | ICD-10-CM | POA: Diagnosis present

## 2023-03-24 DIAGNOSIS — I700 Atherosclerosis of aorta: Secondary | ICD-10-CM | POA: Diagnosis not present

## 2023-03-24 LAB — CBC
HCT: 41.3 % (ref 39.0–52.0)
Hemoglobin: 14 g/dL (ref 13.0–17.0)
MCH: 30.6 pg (ref 26.0–34.0)
MCHC: 33.9 g/dL (ref 30.0–36.0)
MCV: 90.2 fL (ref 80.0–100.0)
Platelets: 264 10*3/uL (ref 150–400)
RBC: 4.58 MIL/uL (ref 4.22–5.81)
RDW: 13.3 % (ref 11.5–15.5)
WBC: 6.9 10*3/uL (ref 4.0–10.5)
nRBC: 0 % (ref 0.0–0.2)

## 2023-03-24 LAB — COMPREHENSIVE METABOLIC PANEL
ALT: 25 U/L (ref 0–44)
AST: 24 U/L (ref 15–41)
Albumin: 3.9 g/dL (ref 3.5–5.0)
Alkaline Phosphatase: 76 U/L (ref 38–126)
Anion gap: 14 (ref 5–15)
BUN: 12 mg/dL (ref 8–23)
CO2: 22 mmol/L (ref 22–32)
Calcium: 9.3 mg/dL (ref 8.9–10.3)
Chloride: 98 mmol/L (ref 98–111)
Creatinine, Ser: 0.9 mg/dL (ref 0.61–1.24)
GFR, Estimated: >60 mL/min (ref >60)
Glucose, Bld: 104 mg/dL — ABNORMAL HIGH (ref 70–99)
Potassium: 4 mmol/L (ref 3.5–5.1)
Sodium: 134 mmol/L — ABNORMAL LOW (ref 135–145)
Total Bilirubin: 1.1 mg/dL (ref 0.3–1.2)
Total Protein: 7.3 g/dL (ref 6.5–8.1)

## 2023-03-24 MED ORDER — PREDNISONE 20 MG PO TABS: 40.0000 mg | ORAL_TABLET | Freq: Every day | ORAL | 0 refills | Status: AC

## 2023-03-24 MED ORDER — METHYLPREDNISOLONE SODIUM SUCC 125 MG IJ SOLR
125.0000 mg | Freq: Once | INTRAMUSCULAR | Status: AC
  Administered 2023-03-24: 125 mg via INTRAVENOUS
  Filled 2023-03-24: qty 2

## 2023-03-24 MED ORDER — IPRATROPIUM-ALBUTEROL 0.5-2.5 (3) MG/3ML IN SOLN
9.0000 mL | Freq: Once | RESPIRATORY_TRACT | Status: AC
  Administered 2023-03-24: 9 mL via RESPIRATORY_TRACT
  Filled 2023-03-24: qty 3

## 2023-03-24 MED ORDER — ALBUTEROL SULFATE HFA 108 (90 BASE) MCG/ACT IN AERS: 2.0000 | INHALATION_SPRAY | RESPIRATORY_TRACT | Status: DC | PRN

## 2023-03-24 NOTE — Discharge Instructions (Addendum)
You have been seen today for your complaint of shortness of breath. Your lab work  was reassuring. Your imaging was reassuring. Your discharge medications include prednisone. This is a steroid. Take it as prescribed. Take it for the entire duration of the prescription.  Follow up with: your PCP as scheduled Please seek immediate medical care if you develop any of the following symptoms: You have shortness of breath while resting. You have shortness of breath that stops you from: Being able to talk. Doing normal activities. Your chest hurts for longer than 5 minutes. Your skin color is more blue than usual. Your pulse oximeter shows that you have low oxygen for longer than 5 minutes. You have a fever. You feel too tired to breathe normally. At this time there does not appear to be the presence of an emergent medical condition, however there is always the potential for conditions to change. Please read and follow the below instructions.  Do not take your medicine if  develop an itchy rash, swelling in your mouth or lips, or difficulty breathing; call 911 and seek immediate emergency medical attention if this occurs.  You may review your lab tests and imaging results in their entirety on your MyChart account.  Please discuss all results of fully with your primary care provider and other specialist at your follow-up visit.  Note: Portions of this text may have been transcribed using voice recognition software. Every effort was made to ensure accuracy; however, inadvertent computerized transcription errors may still be present.

## 2023-03-24 NOTE — ED Notes (Signed)
RT at the bedside.

## 2023-03-24 NOTE — ED Notes (Signed)
RN provided pt urinal 

## 2023-03-24 NOTE — ED Triage Notes (Signed)
Pt arrived from home via POV c/o sob. Pt states that he has copd and just can not get his breath this morning.

## 2023-03-24 NOTE — ED Provider Notes (Signed)
[place] EMERGENCY DEPARTMENT AT [facility] Provider Note   CSN: [id number] Arrival date & time: 03/24/23  0556     History  Chief Complaint  Patient presents with   Shortness of Breath    [name] [name] is a 72 y.o. male.  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  States this began last night and has progressively gotten worse.  States it feels like his previous COPD exacerbations.  He has not tried anything at home for his symptoms.  He denies any cough but states he is suctioning mucus out of his tracheostomy tube.  No chest pain, fevers or chills.  No abdominal pain, nausea or vomiting.  States he is supposed to call his primary care provider today to schedule an appointment for follow-up regarding his tracheostomy tube.  He uses albuterol inhaler today without much improvement.  States he has adequate supply at home.   Shortness of Breath      Home Medications Prior to Admission medications   Medication Sig Start Date End Date Taking? Authorizing Provider  predniSONE (DELTASONE) 20 MG tablet Take 2 tablets (40 mg total) by mouth daily with breakfast for 4 days. 03/24/23 03/28/23 Yes Judyann Casasola, Edsel Petrin, PA-C  acetaminophen 325 MG tablet Take 2 tablets (650 mg total) by mouth every 6 (six) hours as needed. Patient taking differently: Take 650 mg by mouth every 6 (six) hours as needed for pain. 12/28/19   Maczis, Elmer Sow, PA-C  albuterol (PROVENTIL) (2.5 MG/3ML) 0.083% nebulizer solution Take 3 mLs (2.5 mg total) by nebulization every 4 (four) hours as needed for wheezing or shortness of breath. 07/28/22   Nyoka Cowden, MD  albuterol (VENTOLIN HFA) 108 (90 Base) MCG/ACT inhaler INHALE 2 PUFFS INTO THE LUNGS EVERY 6 HOURS AS NEEDED 08/05/20   Nyoka Cowden, MD  cephALEXin (KEFLEX) 500 MG capsule Take 1 capsule (500 mg total) by mouth 4  (four) times daily. 02/09/23   Mannie Stabile, PA-C  cetirizine (ZYRTEC) 10 MG tablet Take 10 mg by mouth daily as needed for allergies.  04/10/19   [provider]  Ciclopirox 0.77 % gel Apply 1 Application topically daily as needed. 02/10/22   [provider]  ciprofloxacin (CIPRO) 500 MG tablet Take 500 mg by mouth 2 (two) times daily. 07/28/22   [provider]  cyclobenzaprine (FLEXERIL) 10 MG tablet Take 10 mg by mouth daily as needed for muscle spasms. 08/12/20   [provider]  doxycycline (VIBRAMYCIN) 100 MG capsule Take 1 capsule (100 mg total) by mouth 2 (two) times daily. 03/09/23   Small, Brooke L, PA  finasteride (PROSCAR) 5 MG tablet Take 5 mg by mouth daily.    [provider]  fluticasone (FLONASE) 50 MCG/ACT nasal spray Place 2 sprays into both nostrils daily as needed for allergies.     [provider]  guaiFENesin (MUCINEX) 600 MG 12 hr tablet Take 1 tablet (600 mg total) by mouth 2 (two) times daily as needed for to loosen phlegm. Patient taking differently: Take 600 mg by mouth daily. 12/28/19   Maczis, Elmer Sow, PA-C  hydrochlorothiazide (HYDRODIURIL) 25 MG tablet Take 1 tablet (25 mg total) by mouth daily. 05/30/18   Rolly Salter, MD  ibuprofen (ADVIL) 800 MG tablet Take 1 tablet (800 mg total) by mouth every 8 (eight) hours as needed. 05/24/20  Kinsinger, De Blanch, MD  losartan (COZAAR) 25 MG tablet Take 25 mg by mouth at bedtime. 01/17/21   [provider]  montelukast (SINGULAIR) 10 MG tablet TAKE 1 TABLET(10 MG) BY MOUTH DAILY 11/02/22   Nyoka Cowden, MD  Multiple Vitamin (MULTIVITAMIN) tablet Take 1 tablet by mouth daily.    [provider]  pantoprazole (PROTONIX) 40 MG tablet TAKE ONE TABLET BY MOUTH BEFORE BREAKFAST (30 MINUTES BEFORE) 03/11/22   Nyoka Cowden, MD  sildenafil (VIAGRA) 100 MG tablet Take 100 mg by mouth as needed for erectile dysfunction. 06/28/20   [provider]   sodium chloride (OCEAN) 0.65 % nasal spray Place 1 spray into the nose as needed for congestion. 10/21/20   [provider]  sodium chloride 0.9 % nebulizer solution Take 3-4 mLs by nebulization daily as needed (Use 3-4 drops into Trach to loosen phlegm suction secretions immediatly after.). 08/17/19   [provider]  SYMBICORT 160-4.5 MCG/ACT inhaler INHALE TWO BY MOUTH PUFFS FIRST THING IN THE MORNING, THEN REPEAT 12 HOURS LATER 05/07/22   Nyoka Cowden, MD  terazosin (HYTRIN) 5 MG capsule Take 5 mg by mouth at bedtime. 02/08/19   [provider]      Allergies    Patient has no known allergies.    Review of Systems   Review of Systems  Respiratory:  Positive for shortness of breath.   All other systems reviewed and are negative.   Physical Exam Updated Vital Signs BP 136/82 (BP Location: Right Arm)   Pulse 81   Temp 97.9 F (36.6 C)   Resp 15   SpO2 99%  Physical Exam Vitals and nursing note reviewed.  Constitutional:      General: He is not in acute distress.    Appearance: He is well-developed.  HENT:     Head: Normocephalic and atraumatic.  Eyes:     Conjunctiva/sclera: Conjunctivae normal.  Cardiovascular:     Rate and Rhythm: Normal rate and regular rhythm.     Heart sounds: No murmur heard. Pulmonary:     Effort: Pulmonary effort is normal.     Breath sounds: Normal breath sounds.     Comments: Mild expiratory wheezing diffusely.  Increased respiratory effort..  Tracheostomy tube in place.  No drainage or surrounding erythema Abdominal:     Palpations: Abdomen is soft.     Tenderness: There is no abdominal tenderness.  Musculoskeletal:        General: No swelling.     Cervical back: Neck supple.     Comments: CAM boot noted to right leg  Skin:    General: Skin is warm and dry.     Capillary Refill: Capillary refill takes less than 2 seconds.  Neurological:     Mental Status: He is alert.  Psychiatric:        Mood and Affect: Mood  normal.     ED Results / Procedures / Treatments   Labs (all labs ordered are listed, but only abnormal results are displayed) Labs Reviewed  COMPREHENSIVE METABOLIC PANEL - Abnormal; Notable for the following components:      Result Value   Sodium 134 (*)    Glucose, Bld 104 (*)    All other components within normal limits  CBC    EKG EKG Interpretation Date/Time:  Wednesday March 24 2023 06:06:05 EDT Ventricular Rate:  86 PR Interval:  160 QRS Duration:  82 QT Interval:  360 QTC Calculation: 430 R Axis:  62  Text Interpretation: Normal sinus rhythm Septal infarct , age undetermined Abnormal ECG When compared with ECG of 22-Feb-2023 07:33, PREVIOUS ECG IS PRESENT No significant change was found Confirmed by Glynn Octave [phone]) on 03/24/2023 6:43:54 AM  Radiology DG Chest 2 View  Result Date: 03/24/2023 CLINICAL DATA:  72 year old male with history of shortness of breath. EXAM: CHEST - 2 VIEW COMPARISON:  Chest x-ray 04/08/2023. FINDINGS: Tracheostomy tube in position with tip terminating approximately 7 cm above the carina. Lung volumes are normal. No consolidative airspace disease. No pleural effusions. No pneumothorax. No pulmonary nodule or mass noted. Pulmonary vasculature and the cardiomediastinal silhouette are within normal limits. Atherosclerosis in the thoracic aorta. Multiple old healed left-sided rib fractures with chest wall deformity again incidentally noted. IMPRESSION: 1.  No radiographic evidence of acute cardiopulmonary disease. 2. Aortic atherosclerosis. Electronically Signed   By: Trudie Reed M.D.   On: 03/24/2023 06:39    Procedures Procedures    Medications Ordered in ED Medications  albuterol (VENTOLIN HFA) 108 (90 Base) MCG/ACT inhaler 2 puff (has no administration in time range)  ipratropium-albuterol (DUONEB) 0.5-2.5 (3) MG/3ML nebulizer solution 9 mL (9 mLs Nebulization Given 03/24/23 0727)  methylPREDNISolone sodium succinate (SOLU-MEDROL)  125 mg/2 mL injection 125 mg (125 mg Intravenous Given 03/24/23 0724)    ED Course/ Medical Decision Making/ A&P Clinical Course as of 03/24/23 0834  Wed Mar 24, 2023  0813 Patient reporting significant improvement in his symptoms after treatment [AS]    Clinical Course User Index [AS] Lula Olszewski Edsel Petrin, PA-C                             Medical Decision Making Amount and/or Complexity of Data Reviewed Labs: ordered. Radiology: ordered.  Risk Prescription drug management.  This patient presents to the ED for concern of shortness of breath, this involves an extensive number of treatment options, and is a complaint that carries with it a high risk of complications and morbidity. The emergent differential diagnosis for shortness of breath includes, but is not limited to, Pulmonary edema, bronchoconstriction, Pneumonia, Pulmonary embolism, Pneumotherax/ Hemothorax, Dysrythmia, ACS.    Co morbidities that complicate the patient evaluation  COPD, hypertension, anxiety, current tracheostomy tube  My initial workup includes labs, imaging, symptom control  Additional history obtained from: Nursing notes from this visit. Previous records within EMR system ED visit for similar on 03/09/2023, 02/22/2023, 02/19/2023, 12/07/2022, 12/05/2022  I ordered, reviewed and interpreted labs which include: CBC, BMP  I ordered imaging studies including chest x ray I independently visualized and interpreted imaging which showed no acute cardiopulmonary abnormalities I agree with the radiologist interpretation  Afebrile, hemodynamically stable.  73 year old male presenting to the ED for evaluation of shortness of breath.  Has a history of COPD.  Has tracheostomy in place from previous injury.  He was suctioning his tracheostomy out at home and states he had more mucus than normal.  He denies any cough.  No fevers or chills.  Chest x-ray normal.  Labs reassuring.  He does have some upper airway breath sounds on  exam however this is likely secondary to his tracheostomy tube.  He did have faint expiratory wheezing.  He was treated with a DuoNeb and Solu-Medrol in the ED and reported significant improvement in his symptoms.  Would like to be discharged home.  Believe this is reasonable.  Overall believe this is a COPD exacerbation versus possibly just an accumulation of mucus  in the upper airway.  He is nontachypneic, nonhypoxic and not in any respiratory distress.  Patient will be treated for COPD exacerbation with p.o. steroids.  He has adequate supply of albuterol at home.  He states he has to call today to schedule an appointment for follow-up tracheostomy care with his primary care provider in [place].  He was encouraged to do this.  He was given return precautions.  Stable at discharge.  At this time there does not appear to be any evidence of an acute emergency medical condition and the patient appears stable for discharge with appropriate outpatient follow up. Diagnosis was discussed with patient who verbalizes understanding of care plan and is agreeable to discharge. I have discussed return precautions with patient who verbalizes understanding. Patient encouraged to follow-up with their PCP within 1 week. All questions answered.  Patient's case discussed with Dr. Lynelle Doctor who agrees with plan to discharge with follow-up.   Note: Portions of this report may have been transcribed using voice recognition software. Every effort was made to ensure accuracy; however, inadvertent computerized transcription errors may still be present.        Final Clinical Impression(s) / ED Diagnoses Final diagnoses:  COPD exacerbation (HCC)    Rx / DC Orders ED Discharge Orders          Ordered    predniSONE (DELTASONE) 20 MG tablet  Daily with breakfast        03/24/23 0830              Michelle Piper, PA-C 03/24/23 4098    Linwood Dibbles, MD 03/25/23 1034

## 2023-03-29 DIAGNOSIS — I10 Essential (primary) hypertension: Secondary | ICD-10-CM | POA: Diagnosis not present

## 2023-03-29 DIAGNOSIS — Z86010 Personal history of colonic polyps: Secondary | ICD-10-CM | POA: Diagnosis not present

## 2023-03-29 DIAGNOSIS — I700 Atherosclerosis of aorta: Secondary | ICD-10-CM | POA: Diagnosis not present

## 2023-03-29 DIAGNOSIS — R7303 Prediabetes: Secondary | ICD-10-CM | POA: Diagnosis not present

## 2023-03-29 DIAGNOSIS — Z0000 Encounter for general adult medical examination without abnormal findings: Secondary | ICD-10-CM | POA: Diagnosis not present

## 2023-03-29 DIAGNOSIS — E782 Mixed hyperlipidemia: Secondary | ICD-10-CM | POA: Diagnosis not present

## 2023-03-29 DIAGNOSIS — J4530 Mild persistent asthma, uncomplicated: Secondary | ICD-10-CM | POA: Diagnosis not present

## 2023-04-06 DIAGNOSIS — Q6689 Other  specified congenital deformities of feet: Secondary | ICD-10-CM | POA: Diagnosis not present

## 2023-04-07 DIAGNOSIS — Z430 Encounter for attention to tracheostomy: Secondary | ICD-10-CM | POA: Diagnosis not present

## 2023-04-26 DIAGNOSIS — L989 Disorder of the skin and subcutaneous tissue, unspecified: Secondary | ICD-10-CM | POA: Diagnosis not present

## 2023-05-04 DIAGNOSIS — Z930 Tracheostomy status: Secondary | ICD-10-CM | POA: Diagnosis not present

## 2023-05-04 DIAGNOSIS — L853 Xerosis cutis: Secondary | ICD-10-CM | POA: Diagnosis not present

## 2023-05-05 ENCOUNTER — Telehealth: Payer: Self-pay | Admitting: Internal Medicine

## 2023-05-05 NOTE — Telephone Encounter (Signed)
PT presented to the front with a form for Dr. Sherene Sires to sign. IT [employer] Harvey his apt complex to give him a designated parking spot. I put it in Dr. Thurston Hole box with a note.  He asks that we call him when ready to pick up.NFN

## 2023-05-07 DIAGNOSIS — Q6689 Other  specified congenital deformities of feet: Secondary | ICD-10-CM | POA: Diagnosis not present

## 2023-05-07 DIAGNOSIS — Z430 Encounter for attention to tracheostomy: Secondary | ICD-10-CM | POA: Diagnosis not present

## 2023-05-13 DIAGNOSIS — Z930 Tracheostomy status: Secondary | ICD-10-CM | POA: Diagnosis not present

## 2023-05-13 NOTE — Telephone Encounter (Signed)
Received fax with note. Dr. Sherene Sires signed and a copy was faxed back over to [employer] office.   The fax we received with Dr. Thurston Hole original signature has been mailed to patient's address on file.   Left voicemail for patient to inform forms have been signed, a copy has been sent over to [employer] and the original has been mailed to him and to call if he had any questions.   Closing encounter at this time.

## 2023-06-07 ENCOUNTER — Telehealth: Payer: Self-pay | Admitting: Internal Medicine

## 2023-06-07 DIAGNOSIS — Z430 Encounter for attention to tracheostomy: Secondary | ICD-10-CM | POA: Diagnosis not present

## 2023-06-07 NOTE — Telephone Encounter (Signed)
Patient is in person. He is having trouble breathing and states he would like a refill of prednisone. Please call and advise.

## 2023-06-07 NOTE — Telephone Encounter (Signed)
Called and spoke w/ patient to  to confirm the form that was needed to be fax, pt said that was form  regarding parking, faxed form to [place]. Pt stated that he gave them form that was mail out to him , however  it was had gotten lost.

## 2023-06-07 NOTE — Telephone Encounter (Signed)
Patient is in person. His company has lost the form and he needs a new copy. Please call and advise. Fax needs to be sent to [phone] [facility]

## 2023-06-10 ENCOUNTER — Other Ambulatory Visit: Payer: Self-pay

## 2023-06-10 ENCOUNTER — Emergency Department (HOSPITAL_COMMUNITY): Payer: No Typology Code available for payment source

## 2023-06-10 ENCOUNTER — Emergency Department (HOSPITAL_COMMUNITY)
Admission: EM | Admit: 2023-06-10 | Discharge: 2023-06-10 | Discharge status: Against Medical Advice | Payer: No Typology Code available for payment source | Attending: Emergency Medicine | Admitting: Emergency Medicine

## 2023-06-10 ENCOUNTER — Encounter (HOSPITAL_COMMUNITY): Payer: Self-pay

## 2023-06-10 DIAGNOSIS — Z5321 Procedure and treatment not carried out due to patient leaving prior to being seen by health care provider: Secondary | ICD-10-CM | POA: Insufficient documentation

## 2023-06-10 DIAGNOSIS — S2242XA Multiple fractures of ribs, left side, initial encounter for closed fracture: Secondary | ICD-10-CM | POA: Diagnosis not present

## 2023-06-10 DIAGNOSIS — Z1152 Encounter for screening for COVID-19: Secondary | ICD-10-CM | POA: Diagnosis not present

## 2023-06-10 DIAGNOSIS — R0600 Dyspnea, unspecified: Secondary | ICD-10-CM | POA: Diagnosis not present

## 2023-06-10 DIAGNOSIS — R55 Syncope and collapse: Secondary | ICD-10-CM | POA: Diagnosis not present

## 2023-06-10 DIAGNOSIS — R0602 Shortness of breath: Secondary | ICD-10-CM | POA: Diagnosis not present

## 2023-06-10 DIAGNOSIS — J449 Chronic obstructive pulmonary disease, unspecified: Secondary | ICD-10-CM | POA: Diagnosis not present

## 2023-06-10 LAB — CBC WITH DIFFERENTIAL/PLATELET
Abs Immature Granulocytes: 0.01 10*3/uL (ref 0.00–0.07)
Basophils Absolute: 0.1 10*3/uL (ref 0.0–0.1)
Basophils Relative: 1 %
Eosinophils Absolute: 0.6 10*3/uL — ABNORMAL HIGH (ref 0.0–0.5)
Eosinophils Relative: 10 %
HCT: 41.3 % (ref 39.0–52.0)
Hemoglobin: 14.1 g/dL (ref 13.0–17.0)
Immature Granulocytes: 0 %
Lymphocytes Relative: 28 %
Lymphs Abs: 1.8 10*3/uL (ref 0.7–4.0)
MCH: 31.5 pg (ref 26.0–34.0)
MCHC: 34.1 g/dL (ref 30.0–36.0)
MCV: 92.2 fL (ref 80.0–100.0)
Monocytes Absolute: 0.5 10*3/uL (ref 0.1–1.0)
Monocytes Relative: 8 %
Neutro Abs: 3.3 10*3/uL (ref 1.7–7.7)
Neutrophils Relative %: 53 %
Platelets: 281 10*3/uL (ref 150–400)
RBC: 4.48 MIL/uL (ref 4.22–5.81)
RDW: 13 % (ref 11.5–15.5)
WBC: 6.3 10*3/uL (ref 4.0–10.5)
nRBC: 0 % (ref 0.0–0.2)

## 2023-06-10 LAB — BASIC METABOLIC PANEL
Anion gap: 11 (ref 5–15)
BUN: 19 mg/dL (ref 8–23)
CO2: 23 mmol/L (ref 22–32)
Calcium: 9.2 mg/dL (ref 8.9–10.3)
Chloride: 102 mmol/L (ref 98–111)
Creatinine, Ser: 0.85 mg/dL (ref 0.61–1.24)
GFR, Estimated: >60 mL/min (ref >60)
Glucose, Bld: 106 mg/dL — ABNORMAL HIGH (ref 70–99)
Potassium: 3.6 mmol/L (ref 3.5–5.1)
Sodium: 136 mmol/L (ref 135–145)

## 2023-06-10 LAB — TROPONIN I (HIGH SENSITIVITY): Troponin I (High Sensitivity): 5 ng/L (ref <18)

## 2023-06-10 LAB — SARS CORONAVIRUS 2 BY RT PCR: SARS Coronavirus 2 by RT PCR: NEGATIVE

## 2023-06-10 NOTE — Telephone Encounter (Signed)
Be sure he's leaving tach plug  out if hears wheezing and ok to try Prednisone 10 mg take  4 each am x 2 days,   2 each am x 2 days,  1 each am x 2 days and stop but needs to be seen in er if worsens with plug out of trach and not responding to his

## 2023-06-10 NOTE — Telephone Encounter (Signed)
Patient checking on message for medication. Patient states having symptom of shortness of breath. Pharmacy is [facility] [address] Patient phone number is [phone],

## 2023-06-10 NOTE — Telephone Encounter (Signed)
Patient checking on message for medication. Patient still having shortness of breath. Patient phone number is [phone].

## 2023-06-10 NOTE — ED Triage Notes (Signed)
Pt arrive reporting shob since Monday. States has been using inhalers and taking prn treatments. Reports since the weather changed he has been feeling this way. Pt has trach. Denies using O2 at home. Patient able to speak in full sentences, Nad in triage noted

## 2023-06-10 NOTE — ED Provider Triage Note (Signed)
Emergency Medicine Provider Triage Evaluation Note  Ohn Brinkley , a 72 y.o. male  was evaluated in triage.  Pt complains of shortness of breath for the past few days.  History of COPD.  Trach in place.  Has been using breathing treatments with relief in symptoms however, symptoms return a few hours later.  No chest pain.  No fever or chills.  Review of Systems  Positive: SOB Negative: CP  Physical Exam  BP 126/69 (BP Location: Left Arm)   Pulse 88   Temp 98.3 F (36.8 C) (Oral)   Resp (!) 24   Ht 5\' 5"  (1.651 m)   Wt 65.8 kg   SpO2 97%   BMI 24.14 kg/m  Gen:   Awake, no distress   Resp:  Normal effort  MSK:   Moves extremities without difficulty  Other:    Medical Decision Making  Medically screening exam initiated at 5:06 PM.  Appropriate orders placed.  [name] was informed that the remainder of the evaluation will be completed by another provider, this initial triage assessment does not replace that evaluation, and the importance of remaining in the ED until their evaluation is complete.  Labs CXR  EKG   Mannie Stabile, [place] 06/10/23 1707

## 2023-06-10 NOTE — Telephone Encounter (Signed)
Increased DOE for 3 days. Wheezing. No cough. Does have white mucus. No fevers, chills or sweats.  Does not monitor his O2 at home.   Wants to know if Dr. Sherene Sires will send in Prednisone for him.  Albuterol neb- Every 4-6 hours Symbicort- BID    [facility] [address]

## 2023-06-11 MED ORDER — PREDNISONE 10 MG PO TABS: ORAL_TABLET | ORAL | 0 refills | Status: DC

## 2023-06-11 NOTE — Telephone Encounter (Signed)
I have notified the patient and sent in the Prednisone to his pharmacy.  Nothing further needed.

## 2023-06-14 DIAGNOSIS — R0600 Dyspnea, unspecified: Secondary | ICD-10-CM | POA: Diagnosis not present

## 2023-07-02 ENCOUNTER — Other Ambulatory Visit (HOSPITAL_COMMUNITY): Payer: Self-pay

## 2023-07-02 ENCOUNTER — Emergency Department (HOSPITAL_COMMUNITY): Payer: No Typology Code available for payment source

## 2023-07-02 ENCOUNTER — Other Ambulatory Visit (HOSPITAL_COMMUNITY): Payer: 59

## 2023-07-02 ENCOUNTER — Inpatient Hospital Stay (HOSPITAL_COMMUNITY)
Admission: EM | Admit: 2023-07-02 | Discharge: 2023-07-04 | DRG: 190 | Disposition: A | Discharge status: Home / Self Care | Payer: No Typology Code available for payment source | Attending: Internal Medicine | Admitting: Internal Medicine

## 2023-07-02 ENCOUNTER — Encounter (HOSPITAL_COMMUNITY): Payer: Self-pay

## 2023-07-02 DIAGNOSIS — Z79899 Other long term (current) drug therapy: Secondary | ICD-10-CM

## 2023-07-02 DIAGNOSIS — J441 Chronic obstructive pulmonary disease with (acute) exacerbation: Secondary | ICD-10-CM | POA: Diagnosis not present

## 2023-07-02 DIAGNOSIS — Z825 Family history of asthma and other chronic lower respiratory diseases: Secondary | ICD-10-CM

## 2023-07-02 DIAGNOSIS — S2242XA Multiple fractures of ribs, left side, initial encounter for closed fracture: Secondary | ICD-10-CM | POA: Diagnosis not present

## 2023-07-02 DIAGNOSIS — M1990 Unspecified osteoarthritis, unspecified site: Secondary | ICD-10-CM | POA: Diagnosis present

## 2023-07-02 DIAGNOSIS — R0602 Shortness of breath: Secondary | ICD-10-CM | POA: Diagnosis not present

## 2023-07-02 DIAGNOSIS — Z8709 Personal history of other diseases of the respiratory system: Secondary | ICD-10-CM

## 2023-07-02 DIAGNOSIS — Z9889 Other specified postprocedural states: Secondary | ICD-10-CM

## 2023-07-02 DIAGNOSIS — J3802 Paralysis of vocal cords and larynx, bilateral: Secondary | ICD-10-CM | POA: Diagnosis present

## 2023-07-02 DIAGNOSIS — K219 Gastro-esophageal reflux disease without esophagitis: Secondary | ICD-10-CM | POA: Diagnosis present

## 2023-07-02 DIAGNOSIS — R058 Other specified cough: Secondary | ICD-10-CM | POA: Diagnosis present

## 2023-07-02 DIAGNOSIS — I10 Essential (primary) hypertension: Secondary | ICD-10-CM | POA: Diagnosis present

## 2023-07-02 DIAGNOSIS — E871 Hypo-osmolality and hyponatremia: Secondary | ICD-10-CM | POA: Diagnosis present

## 2023-07-02 DIAGNOSIS — E878 Other disorders of electrolyte and fluid balance, not elsewhere classified: Secondary | ICD-10-CM | POA: Diagnosis present

## 2023-07-02 DIAGNOSIS — E119 Type 2 diabetes mellitus without complications: Secondary | ICD-10-CM | POA: Diagnosis present

## 2023-07-02 DIAGNOSIS — F419 Anxiety disorder, unspecified: Secondary | ICD-10-CM | POA: Diagnosis present

## 2023-07-02 DIAGNOSIS — J9621 Acute and chronic respiratory failure with hypoxia: Secondary | ICD-10-CM | POA: Diagnosis present

## 2023-07-02 DIAGNOSIS — J9601 Acute respiratory failure with hypoxia: Secondary | ICD-10-CM | POA: Diagnosis present

## 2023-07-02 DIAGNOSIS — Z930 Tracheostomy status: Secondary | ICD-10-CM

## 2023-07-02 DIAGNOSIS — Z1152 Encounter for screening for COVID-19: Secondary | ICD-10-CM

## 2023-07-02 LAB — COMPREHENSIVE METABOLIC PANEL
ALT: 26 U/L (ref 0–44)
AST: 32 U/L (ref 15–41)
Albumin: 3.8 g/dL (ref 3.5–5.0)
Alkaline Phosphatase: 84 U/L (ref 38–126)
Anion gap: 13 (ref 5–15)
BUN: 15 mg/dL (ref 8–23)
CO2: 23 mmol/L (ref 22–32)
Calcium: 8.7 mg/dL — ABNORMAL LOW (ref 8.9–10.3)
Chloride: 96 mmol/L — ABNORMAL LOW (ref 98–111)
Creatinine, Ser: 0.96 mg/dL (ref 0.61–1.24)
GFR, Estimated: >60 mL/min (ref >60)
Glucose, Bld: 123 mg/dL — ABNORMAL HIGH (ref 70–99)
Potassium: 4 mmol/L (ref 3.5–5.1)
Sodium: 132 mmol/L — ABNORMAL LOW (ref 135–145)
Total Bilirubin: 0.6 mg/dL (ref 0.3–1.2)
Total Protein: 7.3 g/dL (ref 6.5–8.1)

## 2023-07-02 LAB — CBC WITH DIFFERENTIAL/PLATELET
Abs Immature Granulocytes: 0.03 10*3/uL (ref 0.00–0.07)
Basophils Absolute: 0 10*3/uL (ref 0.0–0.1)
Basophils Relative: 1 %
Eosinophils Absolute: 0.4 10*3/uL (ref 0.0–0.5)
Eosinophils Relative: 5 %
HCT: 41.3 % (ref 39.0–52.0)
Hemoglobin: 14.3 g/dL (ref 13.0–17.0)
Immature Granulocytes: 0 %
Lymphocytes Relative: 22 %
Lymphs Abs: 1.9 10*3/uL (ref 0.7–4.0)
MCH: 31.4 pg (ref 26.0–34.0)
MCHC: 34.6 g/dL (ref 30.0–36.0)
MCV: 90.8 fL (ref 80.0–100.0)
Monocytes Absolute: 0.8 10*3/uL (ref 0.1–1.0)
Monocytes Relative: 10 %
Neutro Abs: 5.3 10*3/uL (ref 1.7–7.7)
Neutrophils Relative %: 62 %
Platelets: 254 10*3/uL (ref 150–400)
RBC: 4.55 MIL/uL (ref 4.22–5.81)
RDW: 12.5 % (ref 11.5–15.5)
WBC: 8.5 10*3/uL (ref 4.0–10.5)
nRBC: 0 % (ref 0.0–0.2)

## 2023-07-02 LAB — RESP PANEL BY RT-PCR (RSV, FLU A&B, COVID)  RVPGX2
Influenza A by PCR: NEGATIVE
Influenza B by PCR: NEGATIVE
Resp Syncytial Virus by PCR: NEGATIVE
SARS Coronavirus 2 by RT PCR: NEGATIVE

## 2023-07-02 LAB — TROPONIN I (HIGH SENSITIVITY)
Troponin I (High Sensitivity): 6 ng/L (ref <18)
Troponin I (High Sensitivity): 6 ng/L (ref <18)

## 2023-07-02 LAB — I-STAT VENOUS BLOOD GAS, ED
Acid-base deficit: 2 mmol/L (ref 0.0–2.0)
Bicarbonate: 23.7 mmol/L (ref 20.0–28.0)
Calcium, Ion: 1.09 mmol/L — ABNORMAL LOW (ref 1.15–1.40)
HCT: 43 % (ref 39.0–52.0)
Hemoglobin: 14.6 g/dL (ref 13.0–17.0)
O2 Saturation: 85 %
Potassium: 3.7 mmol/L (ref 3.5–5.1)
Sodium: 136 mmol/L (ref 135–145)
TCO2: 25 mmol/L (ref 22–32)
pCO2, Ven: 42.3 mm[Hg] — ABNORMAL LOW (ref 44–60)
pH, Ven: 7.356 (ref 7.25–7.43)
pO2, Ven: 52 mm[Hg] — ABNORMAL HIGH (ref 32–45)

## 2023-07-02 LAB — BRAIN NATRIURETIC PEPTIDE: B Natriuretic Peptide: 9 pg/mL (ref 0.0–100.0)

## 2023-07-02 MED ORDER — PANTOPRAZOLE SODIUM 40 MG PO TBEC
40.0000 mg | DELAYED_RELEASE_TABLET | Freq: Every day | ORAL | Status: DC
  Administered 2023-07-02 – 2023-07-04 (×3): 40 mg via ORAL
  Filled 2023-07-02 (×3): qty 1

## 2023-07-02 MED ORDER — ARFORMOTEROL TARTRATE 15 MCG/2ML IN NEBU
15.0000 ug | INHALATION_SOLUTION | Freq: Two times a day (BID) | RESPIRATORY_TRACT | Status: DC
  Administered 2023-07-02 – 2023-07-04 (×4): 15 ug via RESPIRATORY_TRACT
  Filled 2023-07-02 (×4): qty 2

## 2023-07-02 MED ORDER — ALBUTEROL SULFATE (2.5 MG/3ML) 0.083% IN NEBU
INHALATION_SOLUTION | RESPIRATORY_TRACT | Status: AC
  Filled 2023-07-02: qty 12

## 2023-07-02 MED ORDER — UMECLIDINIUM BROMIDE 62.5 MCG/ACT IN AEPB
1.0000 | INHALATION_SPRAY | Freq: Every day | RESPIRATORY_TRACT | Status: DC
  Filled 2023-07-02: qty 7

## 2023-07-02 MED ORDER — FLUTICASONE FUROATE-VILANTEROL 200-25 MCG/ACT IN AEPB
1.0000 | INHALATION_SPRAY | Freq: Every day | RESPIRATORY_TRACT | Status: DC
  Filled 2023-07-02: qty 28

## 2023-07-02 MED ORDER — FINASTERIDE 5 MG PO TABS
5.0000 mg | ORAL_TABLET | Freq: Every day | ORAL | Status: DC
  Administered 2023-07-02 – 2023-07-04 (×3): 5 mg via ORAL
  Filled 2023-07-02 (×3): qty 1

## 2023-07-02 MED ORDER — BENZONATATE 100 MG PO CAPS: 100.0000 mg | ORAL_CAPSULE | Freq: Three times a day (TID) | ORAL | Status: DC | PRN

## 2023-07-02 MED ORDER — LIDOCAINE 5 % EX PTCH
1.0000 | MEDICATED_PATCH | CUTANEOUS | Status: DC
  Administered 2023-07-02 – 2023-07-03 (×2): 1 via TRANSDERMAL
  Filled 2023-07-02 (×2): qty 1

## 2023-07-02 MED ORDER — ALBUTEROL (5 MG/ML) CONTINUOUS INHALATION SOLN
10.0000 mg/h | INHALATION_SOLUTION | Freq: Once | RESPIRATORY_TRACT | Status: AC
  Administered 2023-07-02: 10 mg/h via RESPIRATORY_TRACT
  Filled 2023-07-02: qty 20

## 2023-07-02 MED ORDER — AZITHROMYCIN 250 MG PO TABS
500.0000 mg | ORAL_TABLET | Freq: Every day | ORAL | Status: AC
  Administered 2023-07-02 – 2023-07-04 (×3): 500 mg via ORAL
  Filled 2023-07-02 (×3): qty 2

## 2023-07-02 MED ORDER — ENOXAPARIN SODIUM 40 MG/0.4ML IJ SOSY
40.0000 mg | PREFILLED_SYRINGE | INTRAMUSCULAR | Status: DC
  Administered 2023-07-02 – 2023-07-04 (×3): 40 mg via SUBCUTANEOUS
  Filled 2023-07-02 (×3): qty 0.4

## 2023-07-02 MED ORDER — ACETAMINOPHEN 325 MG PO TABS
650.0000 mg | ORAL_TABLET | Freq: Four times a day (QID) | ORAL | Status: DC | PRN
  Administered 2023-07-02 – 2023-07-03 (×2): 650 mg via ORAL
  Filled 2023-07-02 (×2): qty 2

## 2023-07-02 MED ORDER — PREDNISONE 20 MG PO TABS
40.0000 mg | ORAL_TABLET | Freq: Every day | ORAL | Status: DC
  Administered 2023-07-03 – 2023-07-04 (×2): 40 mg via ORAL
  Filled 2023-07-02 (×2): qty 2

## 2023-07-02 MED ORDER — HYDROCHLOROTHIAZIDE 25 MG PO TABS
25.0000 mg | ORAL_TABLET | Freq: Every day | ORAL | Status: DC
  Administered 2023-07-02 – 2023-07-04 (×3): 25 mg via ORAL
  Filled 2023-07-02 (×3): qty 1

## 2023-07-02 MED ORDER — IPRATROPIUM-ALBUTEROL 0.5-2.5 (3) MG/3ML IN SOLN
3.0000 mL | Freq: Four times a day (QID) | RESPIRATORY_TRACT | Status: DC
  Administered 2023-07-02 – 2023-07-04 (×8): 3 mL via RESPIRATORY_TRACT
  Filled 2023-07-02 (×7): qty 3

## 2023-07-02 MED ORDER — FLUTICASONE PROPIONATE 50 MCG/ACT NA SUSP: 2.0000 | Freq: Every day | NASAL | Status: DC | PRN

## 2023-07-02 MED ORDER — HYDROXYZINE HCL 25 MG PO TABS: 25.0000 mg | ORAL_TABLET | Freq: Three times a day (TID) | ORAL | Status: DC | PRN

## 2023-07-02 MED ORDER — GUAIFENESIN ER 600 MG PO TB12
600.0000 mg | ORAL_TABLET | Freq: Two times a day (BID) | ORAL | Status: DC
  Administered 2023-07-02 (×2): 600 mg via ORAL
  Filled 2023-07-02 (×3): qty 1

## 2023-07-02 MED ORDER — BUDESONIDE 0.25 MG/2ML IN SUSP
0.2500 mg | Freq: Two times a day (BID) | RESPIRATORY_TRACT | Status: DC
  Administered 2023-07-02 – 2023-07-04 (×4): 0.25 mg via RESPIRATORY_TRACT
  Filled 2023-07-02 (×4): qty 2

## 2023-07-02 MED ORDER — REVEFENACIN 175 MCG/3ML IN SOLN
175.0000 ug | Freq: Every day | RESPIRATORY_TRACT | Status: DC
  Administered 2023-07-03 – 2023-07-04 (×2): 175 ug via RESPIRATORY_TRACT
  Filled 2023-07-02 (×2): qty 3

## 2023-07-02 MED ORDER — LOSARTAN POTASSIUM 50 MG PO TABS
25.0000 mg | ORAL_TABLET | Freq: Every day | ORAL | Status: DC
  Administered 2023-07-02 – 2023-07-03 (×2): 25 mg via ORAL
  Filled 2023-07-02 (×2): qty 1

## 2023-07-02 MED ORDER — TERAZOSIN HCL 5 MG PO CAPS
5.0000 mg | ORAL_CAPSULE | Freq: Every day | ORAL | Status: DC
  Administered 2023-07-02 – 2023-07-03 (×2): 5 mg via ORAL
  Filled 2023-07-02 (×3): qty 1

## 2023-07-02 MED ORDER — IPRATROPIUM-ALBUTEROL 0.5-2.5 (3) MG/3ML IN SOLN
3.0000 mL | Freq: Four times a day (QID) | RESPIRATORY_TRACT | Status: DC | PRN
  Administered 2023-07-02: 3 mL via RESPIRATORY_TRACT
  Filled 2023-07-02: qty 3

## 2023-07-02 MED ORDER — MONTELUKAST SODIUM 10 MG PO TABS
10.0000 mg | ORAL_TABLET | Freq: Every day | ORAL | Status: DC
  Administered 2023-07-02 – 2023-07-03 (×2): 10 mg via ORAL
  Filled 2023-07-02 (×2): qty 1

## 2023-07-02 NOTE — ED Provider Notes (Signed)
Patient seen after prior EDP.  Patient would benefit for admission.  The admitting team is aware of case and will evaluate for same.     Wynetta Fines, MD 07/02/23 [phone]

## 2023-07-02 NOTE — Plan of Care (Signed)
[facility] Tonsillectomy/Adenoidectomy, Postoperative PEDS care plan entered in error.

## 2023-07-02 NOTE — ED Triage Notes (Signed)
Pt comes in from home with respiratory distress. Patient has a trache, no O2 use at home generally. EMS gave 125 mg of Solumedrol, and two Duo Nebs. Patient has wheezes at and not much movement with EMS. After nebulizer more air flow noted with a dry cough. Patient is alert and oriented.

## 2023-07-02 NOTE — H&P (Addendum)
Date: 07/02/2023               Patient Name:  [name] [name] MRN: [id number]  DOB: June 08, 1951 Age / Sex: 72 y.o., male   PCP: [facility]         Medical Service: Internal Medicine Teaching Service         Attending Physician: Dr. Dickie La, MD    First Contact: Dr. Annett Fabian Pager: [phone]  Second Contact: Dr. Rana Snare Pager: [phone]       After Hours (After 5p/  First Contact Pager: [phone]  weekends / holidays): Second Contact Pager: [phone]   Chief Complaint: shortness of breath  History of Present Illness: [name] [name] is a 72 y.o. M with PMH COPD, diabetes, upper airway cough syndrome, vocal cord s/p tracheostomy, anxiety, hypertension who was BIB EMS in respiratory distress admitted for COPD exacerbation.  Patient was brought in via EMS who reported that on their arrival his oxygen saturation was in the 70s and he was in respiratory distress.  They gave him Solu-Medrol, 2 DuoNebs which did improve his wheezing and on arrival to the ED he was placed on continuous nebulizer for 1.5 hours as well as supplemental O2 over his tracheostomy.  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  He does not require supplemental oxygen at home.  He explains that over the last several months he has been having more difficulty with his breathing and exertional limitations as a result.  This has led him to present for evaluation multiple times.  His regular COPD trigger is weather changes, and he has allergies.  He explains that he has had a cough over the last 2 weeks that is more productive than normal with white sputum.  He denies other sick symptoms or sick contacts.  He has not recently been ill himself.  Denies fever or chills.  Normally he will use his rescue inhaler once per day but has been using it 4-5 times per day.  He does not think that Mucinex which he takes twice daily has been helping with his sputum thickness  and feels like this current exacerbation came on after he finished a prednisone taper around October 2. Since arrival and initiation of treatment here, he is feeling much improved.  Initial evaluation revealed normal BNP, no leukocytosis, relatively normal VBG, negative troponins, no active disease x-ray.  He does have a mild hyponatremia and hypochloremia. IMTS paged for admission.  Meds:  Tylenol 650 mg q6h PRN pain Albuterol 2 puffs q6h PRN Finasteride 5 mg daily Flonase 2 sprays each nostril PRN allergies Mucinex 600 mg BID PRN to loosen phlegm Hydrochlorothiazide 25 mg daily Ibuprofen 800 mg q8h PRN pain Losartan 25 mg daily Montelukast 10 mg daily Multivitamin 1 tablet daily Protonix 40 mg daily Sildenafil 100 mg PRN Ocean nasal spray PRN congestion Symbicort 2 puffs BID Terazosin 5 mg daily at bedtime  Allergies: Allergies as of 07/02/2023   (No Known Allergies)   Past Medical History: Past Medical History:  Diagnosis Date   Abnormal EKG 12/24/11   anteroseptal and lateral ST elevation, felt r/t early repolarization;  Cardiac cath 12/24/11 - normal coronary anatomy, EF 55-65%   Anxiety    Arthritis    Asthma    Bradycardia, sinus 12/24/11   COPD (chronic obstructive pulmonary disease) (HCC)    Dyspnea    H/O tracheostomy    History of alcohol abuse  hospitalized for detox 2002   HTN (hypertension)    Hypotension 12/24/11   in the setting of dehydration    Marijuana use    Pneumonia    Syncope and collapse 12/24/11   2/2 hypotension in the setting of dehydration   Upper airway cough syndrome    Past Surgical History: Past Surgical History:  Procedure Laterality Date   BIOPSY  08/20/2020   Procedure: BIOPSY;  Surgeon: Kathi Der, MD;  Location: [facility];  Service: Gastroenterology;;   CALCANEAL OSTEOTOMY Left 10/30/2022   Procedure: CALCANEAL OSTEOTOMY, SPLIT ANTERIOR TIBIAL TENDON TRANSFER, PERINEAL TENDON TRANSFER, Z- LENGTHENING OF POSTERIOR TIBIAL  TENDON;  Surgeon: Kieth Brightly, DPM;  Location: [facility];  Service: Podiatry;  Laterality: Left;   CARDIAC CATHETERIZATION  12/24/11   normal coronary anatomy, EF 55-65%   CIRCUMCISION  1972   COLONOSCOPY WITH PROPOFOL N/A 02/24/2018   Procedure: COLONOSCOPY WITH PROPOFOL;  Surgeon: Kathi Der, MD;  Location: [facility];  Service: Gastroenterology;  Laterality: N/A;   COLONOSCOPY WITH PROPOFOL N/A 08/20/2020   Procedure: COLONOSCOPY WITH PROPOFOL;  Surgeon: Kathi Der, MD;  Location: [facility];  Service: Gastroenterology;  Laterality: N/A;   CYSTOSCOPY W/ RETROGRADES Bilateral 03/08/2019   Procedure: CYSTOSCOPY WITH RETROGRADE PYELOGRAM;  Surgeon: Sebastian Ache, MD;  Location: [facility];  Service: Urology;  Laterality: Bilateral;   CYSTOSCOPY WITH BIOPSY N/A 03/08/2019   Procedure: CYSTOSCOPY WITH BIOPSY WITH FUGERATION;  Surgeon: Sebastian Ache, MD;  Location: [facility];  Service: Urology;  Laterality: N/A;   ESOPHAGOGASTRODUODENOSCOPY N/A 10/09/2015   Procedure: ESOPHAGOGASTRODUODENOSCOPY (EGD);  Surgeon: Jimmye Norman, MD;  Location: [facility] ENDOSCOPY;  Service: General;  Laterality: N/A;   IRRIGATION AND DEBRIDEMENT KNEE Right 05/23/2017   Procedure: IRRIGATION AND DEBRIDEMENT KNEE;  Surgeon: Toni Arthurs, MD;  Location: [facility];  Service: Orthopedics;  Laterality: Right;   LACERATION REPAIR  02/2004   arthroscopic debridement of triagular fibrocartilage tear/E-chart; right wrist   LEFT HEART CATHETERIZATION WITH CORONARY ANGIOGRAM N/A 12/24/2011   Procedure: LEFT HEART CATHETERIZATION WITH CORONARY ANGIOGRAM;  Surgeon: Peter M Swaziland, MD;  Location: [facility] CATH LAB;  Service: Cardiovascular;  Laterality: N/A;   PEG PLACEMENT N/A 10/09/2015   Procedure: PERCUTANEOUS ENDOSCOPIC GASTROSTOMY (PEG) PLACEMENT;  Surgeon: Jimmye Norman, MD;  Location: [facility] ENDOSCOPY;  Service: General;  Laterality: N/A;   PEG TUBE REMOVAL     PERCUTANEOUS TRACHEOSTOMY N/A 10/09/2015   Procedure: PERCUTANEOUS  TRACHEOSTOMY;  Surgeon: Jimmye Norman, MD;  Location: [facility] OR;  Service: General;  Laterality: N/A;   POLYPECTOMY  02/24/2018   Procedure: POLYPECTOMY;  Surgeon: Kathi Der, MD;  Location: [facility];  Service: Gastroenterology;;   POLYPECTOMY  08/20/2020   Procedure: POLYPECTOMY;  Surgeon: Kathi Der, MD;  Location: [facility];  Service: Gastroenterology;;   SINUS ENDO WITH FUSION  06/07/2018   SINUS ENDO WITH FUSION Bilateral 06/07/2018   Procedure: SINUS ENDO WITH FUSION;  Surgeon: Suzanna Obey, MD;  [facility] OR;  Service: ENT;  Laterality: Bilateral;  with fuision   UMBILICAL HERNIA REPAIR N/A 05/24/2020   Procedure: OPEN UMBILICAL HERNIA REPAIR WITH  MESH;  Surgeon: Kinsinger, De Blanch, MD;  Location: [facility] OR;  Service: General;  Laterality: N/A;   Family History:  Family History  Problem Relation Age of Onset   COPD Sister    Cancer Sister    Asthma Other    Social History: Lives by himself and his girlfriend lives a few doors down the hallway from him. Independent in ADLs, IADLs. PCP is through  the [place]. Drinks about 24 oz of alcohol three tiems per week, denies history of severe withdrawal/withdrawal seizures, last drink a few days ago. Denies tobacco or illicit substance use.   Review of Systems: A complete ROS was negative except as per HPI.   Physical Exam: Blood pressure 136/78, pulse 88, temperature 97.9 F (36.6 C), temperature source Oral, resp. rate (!) 27, SpO2 99%. Constitutional:Appears stated age. Resting comfortably on trach collar, in no acute distress. Cardio:Regular rate and rhythm. No murmurs, rubs, or gallops. Pulm:Diffuse expiratory wheezing. Intermittent dry cough. No increased work of breathing on trach collar. Abdomen:Soft, nontender, nondistended. ZOX:[id number] for extremity edema. Skin:Warm and dry. Neuro:Alert and oriented x3. No focal deficit noted. Psych:Pleasant mood and affect.  EKG: personally reviewed my interpretation is sinus  tachycardia (HR 108)  CXR: personally reviewed my interpretation is no acute cardiopulmonary process.  Assessment & Plan by Problem: Principal Problem:   COPD exacerbation (HCC)  COPD exacerbation Upper airway cough syndrome Vocal cord paralysis syndrome s/p tracheostomy Not on supplemental O2 at home. Currently feeling subjectively improved but not completely back to baseline. COVID-19, influenza negative. Mildly tachycardic following continuous nebulizer, suspect related to that. Has had increasing frequency of exacerbations over the last several months and was last evaluated at [facility] 09/27 and discharged with a prednisone taper and 5-day course of doxycycline for COPD exacerbation. This makes his seventh ED presentation/admission that I am able to see related to his COPD. He is on ICS-LABA with PRN SABA. Plan: -Will treat with triple therapy with DuoNebs q6h, pulmicort BID; would discharge on triple therapy -Continue home singulair -Would discharge on triple therapy -Prednisone 40 mg daily x 4d, beginning tomorrow; advise close pulmonology f/u and slow taper at discharge -Azithromycin 500 mg daily x 3d -Wean O2 as able, goal SpO2 88-92% -Ambulate as able -Cough suppression with benzonatate 100 mg TID PRN  Hyponatremia, mild Mild, 132. Is on thiazide diuretic as OP which likely is source. Plan: -Trend renal function  Anxiety On anxiolytic as outpatient but does not recall the name. PDMP reviewed, nothing seen there. Plan: -Hydroxyzine 25 mg TID PRN anxiety  HTN Hx urinary retention Plan: -Resume home hydrochlorothiazide 25 mg daily, losartan 25 mg daily, finasteride 5 mg daily, terazosin 5 mg daily at bedtime  GERD Plan: -Resume home protonix  Dispo: Admit patient to Observation with expected length of stay less than 2 midnights.  SignedChamp Mungo, DO 07/02/2023, 8:52 AM  After 5pm on weekdays and 1pm on weekends: On Call pager: [phone]
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
## 2023-07-02 NOTE — ED Notes (Signed)
ED TO INPATIENT HANDOFF REPORT  ED Nurse Name and Phone #: Murlean Iba Paramedic [phone]  S Name/Age/Gender [name] [name] 72 y.o. male Room/Bed: 031C/031C  Code Status   Code Status: Full Code  Home/SNF/Other Home Patient oriented to: self, place, time, and situation Is this baseline? Yes   Triage Complete: Triage complete  Chief Complaint COPD exacerbation [facility]) [J44.1]  Triage Note Pt comes in from home with respiratory distress. Patient has a trache, no O2 use at home generally. EMS gave 125 mg of Solumedrol, and two Duo Nebs. Patient has wheezes at and not much movement with EMS. After nebulizer more air flow noted with a dry cough. Patient is alert and oriented.   Allergies No Known Allergies  Level of Care/Admitting Diagnosis ED Disposition     ED Disposition  Admit   Condition  --   Comment  Hospital Area: [facility] [[id number]]  Level of Care: Med-Surg [16]  May place patient in observation at [facility] or [facility] if equivalent level of care is available:: No  Covid Evaluation: Asymptomatic - no recent exposure (last 10 days) testing not required  Diagnosis: COPD exacerbation [id number]) [[id number]]  Admitting Physician: Dickie La [[id number]]  Attending Physician: Dickie La [[id number]]          B Medical/Surgery History Past Medical History:  Diagnosis Date   Abnormal EKG 12/24/11   anteroseptal and lateral ST elevation, felt r/t early repolarization;  Cardiac cath 12/24/11 - normal coronary anatomy, EF 55-65%   Anxiety    Arthritis    Asthma    Bradycardia, sinus 12/24/11   COPD (chronic obstructive pulmonary disease) (HCC)    Dyspnea    H/O tracheostomy    History of alcohol abuse    hospitalized for detox 2002   HTN (hypertension)    Hypotension 12/24/11   in the setting of dehydration    Marijuana use    Pneumonia    Syncope and collapse 12/24/11   2/2 hypotension in the setting of dehydration   Upper airway cough syndrome    Past  Surgical History:  Procedure Laterality Date   BIOPSY  08/20/2020   Procedure: BIOPSY;  Surgeon: Kathi Der, MD;  Location: [facility] ENDOSCOPY;  Service: Gastroenterology;;   CALCANEAL OSTEOTOMY Left 10/30/2022   Procedure: CALCANEAL OSTEOTOMY, SPLIT ANTERIOR TIBIAL TENDON TRANSFER, PERINEAL TENDON TRANSFER, Z- LENGTHENING OF POSTERIOR TIBIAL TENDON;  Surgeon: Kieth Brightly, DPM;  Location: [facility] ORS;  Service: Podiatry;  Laterality: Left;   CARDIAC CATHETERIZATION  12/24/11   normal coronary anatomy, EF 55-65%   CIRCUMCISION  1972   COLONOSCOPY WITH PROPOFOL N/A 02/24/2018   Procedure: COLONOSCOPY WITH PROPOFOL;  Surgeon: Kathi Der, MD;  Location: [facility];  Service: Gastroenterology;  Laterality: N/A;   COLONOSCOPY WITH PROPOFOL N/A 08/20/2020   Procedure: COLONOSCOPY WITH PROPOFOL;  Surgeon: Kathi Der, MD;  Location: [facility];  Service: Gastroenterology;  Laterality: N/A;   CYSTOSCOPY W/ RETROGRADES Bilateral 03/08/2019   Procedure: CYSTOSCOPY WITH RETROGRADE PYELOGRAM;  Surgeon: Sebastian Ache, MD;  Location: [facility];  Service: Urology;  Laterality: Bilateral;   CYSTOSCOPY WITH BIOPSY N/A 03/08/2019   Procedure: CYSTOSCOPY WITH BIOPSY WITH FUGERATION;  Surgeon: Sebastian Ache, MD;  Location: [facility];  Service: Urology;  Laterality: N/A;   ESOPHAGOGASTRODUODENOSCOPY N/A 10/09/2015   Procedure: ESOPHAGOGASTRODUODENOSCOPY (EGD);  Surgeon: Jimmye Norman, MD;  Location: [facility] ENDOSCOPY;  Service: General;  Laterality: N/A;   IRRIGATION AND DEBRIDEMENT KNEE Right 05/23/2017   Procedure: IRRIGATION AND DEBRIDEMENT KNEE;  Surgeon: Toni Arthurs, MD;  Location: [facility];  Service: Orthopedics;  Laterality: Right;   LACERATION REPAIR  02/2004   arthroscopic debridement of triagular fibrocartilage tear/E-chart; right wrist   LEFT HEART CATHETERIZATION WITH CORONARY ANGIOGRAM N/A 12/24/2011   Procedure: LEFT HEART CATHETERIZATION WITH CORONARY ANGIOGRAM;  Surgeon: Peter M Swaziland, MD;  Location:  [facility] CATH LAB;  Service: Cardiovascular;  Laterality: N/A;   PEG PLACEMENT N/A 10/09/2015   Procedure: PERCUTANEOUS ENDOSCOPIC GASTROSTOMY (PEG) PLACEMENT;  Surgeon: Jimmye Norman, MD;  Location: [facility] ENDOSCOPY;  Service: General;  Laterality: N/A;   PEG TUBE REMOVAL     PERCUTANEOUS TRACHEOSTOMY N/A 10/09/2015   Procedure: PERCUTANEOUS TRACHEOSTOMY;  Surgeon: Jimmye Norman, MD;  Location: [facility] OR;  Service: General;  Laterality: N/A;   POLYPECTOMY  02/24/2018   Procedure: POLYPECTOMY;  Surgeon: Kathi Der, MD;  Location: [facility];  Service: Gastroenterology;;   POLYPECTOMY  08/20/2020   Procedure: POLYPECTOMY;  Surgeon: Kathi Der, MD;  Location: [facility];  Service: Gastroenterology;;   SINUS ENDO WITH FUSION  06/07/2018   SINUS ENDO WITH FUSION Bilateral 06/07/2018   Procedure: SINUS ENDO WITH FUSION;  Surgeon: Suzanna Obey, MD;  Location: [facility] OR;  Service: ENT;  Laterality: Bilateral;  with fuision   UMBILICAL HERNIA REPAIR N/A 05/24/2020   Procedure: OPEN UMBILICAL HERNIA REPAIR WITH  MESH;  Surgeon: Kinsinger, De Blanch, MD;  Location: [facility] OR;  Service: General;  Laterality: N/A;     A IV Location/Drains/Wounds Patient Lines/Drains/Airways Status     Active Line/Drains/Airways     Name Placement date Placement time Site Days   Peripheral IV 07/02/23 20 G Anterior;Left;Proximal Forearm 07/02/23  0628  Forearm  less than 1   Tracheostomy Shiley Flexible 6 mm Uncuffed 07/02/23  0630  6 mm  less than 1            Intake/Output Last 24 hours No intake or output data in the 24 hours ending 07/02/23 0854  Labs/Imaging Results for orders placed or performed during the hospital encounter of 07/02/23 (from the past 48 hour(s))  Comprehensive metabolic panel     Status: Abnormal   Collection Time: 07/02/23  6:31 AM  Result Value Ref Range   Sodium 132 (L) 135 - 145 mmol/L   Potassium 4.0 3.5 - 5.1 mmol/L   Chloride 96 (L) 98 - 111 mmol/L   CO2 23 22 - 32 mmol/L   Glucose,  Bld 123 (H) 70 - 99 mg/dL    Comment: Glucose reference range applies only to samples taken after fasting for at least 8 hours.   BUN 15 8 - 23 mg/dL   Creatinine, Ser 7.84 0.61 - 1.24 mg/dL   Calcium 8.7 (L) 8.9 - 10.3 mg/dL   Total Protein 7.3 6.5 - 8.1 g/dL   Albumin 3.8 3.5 - 5.0 g/dL   AST 32 15 - 41 U/L   ALT 26 0 - 44 U/L   Alkaline Phosphatase 84 38 - 126 U/L   Total Bilirubin 0.6 0.3 - 1.2 mg/dL   GFR, Estimated >69 >62 mL/min    Comment: (NOTE) Calculated using the CKD-EPI Creatinine Equation (2021)    Anion gap 13 5 - 15    Comment: Performed at [facility], [address], [place], [place] [address]  Troponin I (High Sensitivity)     Status: None   Collection Time: 07/02/23  6:31 AM  Result Value Ref Range   Troponin I (High Sensitivity) 6 <18 ng/L    Comment: (  NOTE) Elevated high sensitivity troponin I (hsTnI) values and significant  changes across serial measurements may suggest ACS but many other  chronic and acute conditions are known to elevate hsTnI results.  Refer to the "Links" section for chest pain algorithms and additional  guidance. Performed at [facility], [address], [place], [place] [address]   CBC with Differential     Status: None   Collection Time: 07/02/23  6:31 AM  Result Value Ref Range   WBC 8.5 4.0 - 10.5 K/uL   RBC 4.55 4.22 - 5.81 MIL/uL   Hemoglobin 14.3 13.0 - 17.0 g/dL   HCT 19.1 47.8 - 29.5 %   MCV 90.8 80.0 - 100.0 fL   MCH 31.4 26.0 - 34.0 pg   MCHC 34.6 30.0 - 36.0 g/dL   RDW 62.1 30.8 - 65.7 %   Platelets 254 150 - 400 K/uL   nRBC 0.0 0.0 - 0.2 %   Neutrophils Relative % 62 %   Neutro Abs 5.3 1.7 - 7.7 K/uL   Lymphocytes Relative 22 %   Lymphs Abs 1.9 0.7 - 4.0 K/uL   Monocytes Relative 10 %   Monocytes Absolute 0.8 0.1 - 1.0 K/uL   Eosinophils Relative 5 %   Eosinophils Absolute 0.4 0.0 - 0.5 K/uL   Basophils Relative 1 %   Basophils Absolute 0.0 0.0 - 0.1 K/uL   Immature Granulocytes 0 %   Abs  Immature Granulocytes 0.03 0.00 - 0.07 K/uL    Comment: Performed at [facility], [address], [place], [place] [address]  Brain natriuretic peptide     Status: None   Collection Time: 07/02/23  6:39 AM  Result Value Ref Range   B Natriuretic Peptide 9.0 0.0 - 100.0 pg/mL    Comment: Performed at [facility], [address] [place], [place] [address]  I-Stat venous blood gas, [facility] ED, [facility], [facility])     Status: Abnormal   Collection Time: 07/02/23  7:16 AM  Result Value Ref Range   pH, Ven 7.356 7.25 - 7.43   pCO2, Ven 42.3 (L) 44 - 60 mmHg   pO2, Ven 52 (H) 32 - 45 mmHg   Bicarbonate 23.7 20.0 - 28.0 mmol/L   TCO2 25 22 - 32 mmol/L   O2 Saturation 85 %   Acid-base deficit 2.0 0.0 - 2.0 mmol/L   Sodium 136 135 - 145 mmol/L   Potassium 3.7 3.5 - 5.1 mmol/L   Calcium, Ion 1.09 (L) 1.15 - 1.40 mmol/L   HCT 43.0 39.0 - 52.0 %   Hemoglobin 14.6 13.0 - 17.0 g/dL   Sample type VENOUS    DG Chest Portable 1 View  Result Date: 07/02/2023 CLINICAL DATA:  Shortness of breath.  Tracheostomy tube EXAM: PORTABLE CHEST 1 VIEW COMPARISON:  06/10/2023 FINDINGS: Well seated tracheostomy tube. Normal heart size and mediastinal contours. Borderline hyperinflation. No acute infiltrate or edema. No effusion or pneumothorax. No acute osseous findings. Multiple remote and healed left posterior rib fractures. IMPRESSION: No active disease. Electronically Signed   By: Tiburcio Pea M.D.   On: 07/02/2023 06:49    Pending Labs Unresulted Labs (From admission, onward)     Start     Ordered   07/03/23 0500  Basic metabolic panel  Tomorrow morning,   R        07/02/23 0852   07/03/23 0500  CBC  Tomorrow morning,   R  07/02/23 0852   07/02/23 0631  Resp panel by RT-PCR (RSV, Flu A&B, Covid) Anterior Nasal Swab  Once,   URGENT        07/02/23 0630            Vitals/Pain Today's Vitals   07/02/23 0715 07/02/23 0745 07/02/23 0800 07/02/23 0818  BP: (!) 141/74 (!) 141/76 136/78  136/78  Pulse: 98 86 88   Resp: (!) 28 (!) 21 (!) 21 (!) 27  Temp:      TempSrc:      SpO2: 97% 99% 99%   PainSc:        Isolation Precautions No active isolations  Medications Medications  albuterol (PROVENTIL) (2.5 MG/3ML) 0.083% nebulizer solution (0 mg  Hold 07/02/23 0649)  enoxaparin (LOVENOX) injection 40 mg (has no administration in time range)  albuterol (PROVENTIL,VENTOLIN) solution continuous neb (10 mg/hr Nebulization Given 07/02/23 0639)    Mobility non-ambulatory     Focused Assessments Pulmonary Assessment Handoff:  Lung sounds: Bilateral Breath Sounds: Expiratory wheezes O2 Device: Tracheostomy Collar O2 Flow Rate (L/min): (S) 8 L/min    R Recommendations: See Admitting Provider Note  Report given to:   Additional Notes:

## 2023-07-02 NOTE — TOC Benefit Eligibility Note (Signed)
Patient Product/process development scientist completed.    The patient is insured through [employer] [facility]. Patient has [employer], may use a copay card, and/or apply for patient assistance if available.    Ran test claim for Breztri and the current 30 day co-pay is $0.00.  Ran test claim for Trelegy Ellipta [id number] and the current 30 day co-pay is $0.00.  This test claim was processed through [facility]- copay amounts may vary at other pharmacies due to pharmacy/plan contracts, or as the patient moves through the different stages of their insurance plan.     Roland Earl, CPHT Pharmacy Technician III Certified Patient Advocate [facility] Patient Advocate Team Direct Number: [phone]  Fax: [phone]

## 2023-07-03 DIAGNOSIS — Z825 Family history of asthma and other chronic lower respiratory diseases: Secondary | ICD-10-CM | POA: Diagnosis not present

## 2023-07-03 DIAGNOSIS — M1990 Unspecified osteoarthritis, unspecified site: Secondary | ICD-10-CM | POA: Diagnosis present

## 2023-07-03 DIAGNOSIS — E119 Type 2 diabetes mellitus without complications: Secondary | ICD-10-CM | POA: Diagnosis present

## 2023-07-03 DIAGNOSIS — J441 Chronic obstructive pulmonary disease with (acute) exacerbation: Secondary | ICD-10-CM | POA: Diagnosis not present

## 2023-07-03 DIAGNOSIS — Z930 Tracheostomy status: Secondary | ICD-10-CM | POA: Diagnosis not present

## 2023-07-03 DIAGNOSIS — J3802 Paralysis of vocal cords and larynx, bilateral: Secondary | ICD-10-CM | POA: Diagnosis present

## 2023-07-03 DIAGNOSIS — Z79899 Other long term (current) drug therapy: Secondary | ICD-10-CM | POA: Diagnosis not present

## 2023-07-03 DIAGNOSIS — K219 Gastro-esophageal reflux disease without esophagitis: Secondary | ICD-10-CM | POA: Diagnosis present

## 2023-07-03 DIAGNOSIS — E871 Hypo-osmolality and hyponatremia: Secondary | ICD-10-CM | POA: Diagnosis present

## 2023-07-03 DIAGNOSIS — I10 Essential (primary) hypertension: Secondary | ICD-10-CM | POA: Diagnosis present

## 2023-07-03 DIAGNOSIS — J9621 Acute and chronic respiratory failure with hypoxia: Secondary | ICD-10-CM | POA: Diagnosis present

## 2023-07-03 DIAGNOSIS — E878 Other disorders of electrolyte and fluid balance, not elsewhere classified: Secondary | ICD-10-CM | POA: Diagnosis present

## 2023-07-03 DIAGNOSIS — F419 Anxiety disorder, unspecified: Secondary | ICD-10-CM | POA: Diagnosis present

## 2023-07-03 DIAGNOSIS — Z1152 Encounter for screening for COVID-19: Secondary | ICD-10-CM | POA: Diagnosis not present

## 2023-07-03 LAB — BASIC METABOLIC PANEL WITH GFR
Anion gap: 10 (ref 5–15)
BUN: 12 mg/dL (ref 8–23)
CO2: 24 mmol/L (ref 22–32)
Calcium: 9.2 mg/dL (ref 8.9–10.3)
Chloride: 98 mmol/L (ref 98–111)
Creatinine, Ser: 0.83 mg/dL (ref 0.61–1.24)
GFR, Estimated: >60 mL/min
Glucose, Bld: 162 mg/dL — ABNORMAL HIGH (ref 70–99)
Potassium: 3.9 mmol/L (ref 3.5–5.1)
Sodium: 132 mmol/L — ABNORMAL LOW (ref 135–145)

## 2023-07-03 LAB — CBC
HCT: 40.1 % (ref 39.0–52.0)
Hemoglobin: 13.8 g/dL (ref 13.0–17.0)
MCH: 30.8 pg (ref 26.0–34.0)
MCHC: 34.4 g/dL (ref 30.0–36.0)
MCV: 89.5 fL (ref 80.0–100.0)
Platelets: 264 10*3/uL (ref 150–400)
RBC: 4.48 MIL/uL (ref 4.22–5.81)
RDW: 12.5 % (ref 11.5–15.5)
WBC: 8.1 10*3/uL (ref 4.0–10.5)
nRBC: 0 % (ref 0.0–0.2)

## 2023-07-03 MED ORDER — ALBUTEROL SULFATE (2.5 MG/3ML) 0.083% IN NEBU
INHALATION_SOLUTION | RESPIRATORY_TRACT | Status: AC
  Administered 2023-07-03: 2.5 mg
  Filled 2023-07-03: qty 3

## 2023-07-03 MED ORDER — GUAIFENESIN ER 600 MG PO TB12
1200.0000 mg | ORAL_TABLET | Freq: Two times a day (BID) | ORAL | Status: DC
  Administered 2023-07-03 – 2023-07-04 (×3): 1200 mg via ORAL
  Filled 2023-07-03 (×3): qty 2

## 2023-07-03 NOTE — Plan of Care (Signed)

## 2023-07-03 NOTE — Plan of Care (Signed)
  Problem: Education: Goal: Knowledge of General Education information will improve Description: Including pain rating scale, medication(s)/side effects and non-pharmacologic comfort measures 07/03/2023 1728 by Irwin Brakeman, RN Outcome: Progressing 07/03/2023 1719 by Irwin Brakeman, RN Outcome: Progressing   Problem: Health Behavior/Discharge Planning: Goal: Ability to manage health-related needs will improve 07/03/2023 1728 by Irwin Brakeman, RN Outcome: Progressing 07/03/2023 1719 by Irwin Brakeman, RN Outcome: Progressing   Problem: Clinical Measurements: Goal: Ability to maintain clinical measurements within normal limits will improve 07/03/2023 1728 by Irwin Brakeman, RN Outcome: Progressing 07/03/2023 1719 by Irwin Brakeman, RN Outcome: Progressing Goal: Will remain free from infection 07/03/2023 1728 by Irwin Brakeman, RN Outcome: Progressing 07/03/2023 1719 by Irwin Brakeman, RN Outcome: Progressing Goal: Diagnostic test results will improve 07/03/2023 1728 by Irwin Brakeman, RN Outcome: Progressing 07/03/2023 1719 by Irwin Brakeman, RN Outcome: Progressing Goal: Respiratory complications will improve 07/03/2023 1728 by Irwin Brakeman, RN Outcome: Progressing 07/03/2023 1719 by Irwin Brakeman, RN Outcome: Progressing Goal: Cardiovascular complication will be avoided 07/03/2023 1728 by Irwin Brakeman, RN Outcome: Progressing 07/03/2023 1719 by Irwin Brakeman, RN Outcome: Progressing   Problem: Activity: Goal: Risk for activity intolerance will decrease 07/03/2023 1728 by Irwin Brakeman, RN Outcome: Progressing 07/03/2023 1719 by Irwin Brakeman, RN Outcome: Progressing   Problem: Nutrition: Goal: Adequate nutrition will be maintained 07/03/2023 1728 by Irwin Brakeman, RN Outcome: Progressing 07/03/2023 1719 by  Irwin Brakeman, RN Outcome: Progressing   Problem: Coping: Goal: Level of anxiety will decrease 07/03/2023 1728 by Irwin Brakeman, RN Outcome: Progressing 07/03/2023 1719 by Irwin Brakeman, RN Outcome: Progressing   Problem: Elimination: Goal: Will not experience complications related to bowel motility 07/03/2023 1728 by Irwin Brakeman, RN Outcome: Progressing 07/03/2023 1719 by Irwin Brakeman, RN Outcome: Progressing Goal: Will not experience complications related to urinary retention 07/03/2023 1728 by Irwin Brakeman, RN Outcome: Progressing 07/03/2023 1719 by Irwin Brakeman, RN Outcome: Progressing   Problem: Pain Managment: Goal: General experience of comfort will improve 07/03/2023 1728 by Irwin Brakeman, RN Outcome: Progressing 07/03/2023 1719 by Irwin Brakeman, RN Outcome: Progressing   Problem: Safety: Goal: Ability to remain free from injury will improve 07/03/2023 1728 by Irwin Brakeman, RN Outcome: Progressing 07/03/2023 1719 by Irwin Brakeman, RN Outcome: Progressing   Problem: Skin Integrity: Goal: Risk for impaired skin integrity will decrease 07/03/2023 1728 by Irwin Brakeman, RN Outcome: Progressing 07/03/2023 1719 by Irwin Brakeman, RN Outcome: Progressing

## 2023-07-03 NOTE — Progress Notes (Signed)
HD#0 SUBJECTIVE:  Patient Summary: [name] [name] is a 72 y.o. male with a pertinent PMH of COPD, diabetes, upper airway cough syndrome, vocal cord s/p tracheostomy, anxiety, hypertension who was BIB EMS in respiratory distress admitted for COPD exacerbation.   Overnight Events: None  Interim History: Patient evaluated at bedside. Initially patient had increased work of breathing with accessory muscle use. Vitals WNL, satting okay. RT was called, administered breathing treatments and mucus was suctioned. Patient returned to baseline, comfortable and breathing well.   OBJECTIVE:  Vital Signs: Vitals:   07/03/23 0500 07/03/23 0900 07/03/23 0903 07/03/23 0925  BP: 126/81   (!) 123/95  Pulse: 91  90 87  Resp: 18  (!) 24 (!) 24  Temp: 98.1 F (36.7 C)   97.8 F (36.6 C)  TempSrc:    Oral  SpO2: 99% 97% 96% 97%   Supplemental O2: Trach collar SpO2: 97 % O2 Flow Rate (L/min): 6 L/min FiO2 (%): 28 %  There were no vitals filed for this visit.   Intake/Output Summary (Last 24 hours) at 07/03/2023 1143 Last data filed at 07/03/2023 1610 Gross per 24 hour  Intake 120 ml  Output 0 ml  Net 120 ml   Net IO Since Admission: 120 mL [07/03/23 1143]  Physical Exam:  General: well-appearing, in no acute distress after breathing treatments; trach collar in place Cardiac: RRR, no m/r/g Pulmonary: Diffuse wheezing throughout lungs bilaterally, inspiratory and expiratory; normal rate and effort Abdomen: soft, non-distended Neuro: No focal deficits. Alert and oriented.  Psych: Normal mood and affect  Patient Lines/Drains/Airways Status     Active Line/Drains/Airways     Name Placement date Placement time Site Days   Peripheral IV 07/02/23 20 G Anterior;Left;Proximal Forearm 07/02/23  0628  Forearm  1   Tracheostomy Shiley Flexible 6 mm Uncuffed 07/02/23  0630  6 mm  1            Pertinent Labs:    Latest Ref Rng & Units 07/03/2023    8:12 AM 07/02/2023    7:16 AM  07/02/2023    6:31 AM  CBC  WBC 4.0 - 10.5 K/uL 8.1   8.5   Hemoglobin 13.0 - 17.0 g/dL 96.0  45.4  09.8   Hematocrit 39.0 - 52.0 % 40.1  43.0  41.3   Platelets 150 - 400 K/uL 264   254        Latest Ref Rng & Units 07/03/2023    8:12 AM 07/02/2023    7:16 AM 07/02/2023    6:31 AM  CMP  Glucose 70 - 99 mg/dL 119   147   BUN 8 - 23 mg/dL 12   15   Creatinine 8.29 - 1.24 mg/dL 5.62   1.30   Sodium 865 - 145 mmol/L 132  136  132   Potassium 3.5 - 5.1 mmol/L 3.9  3.7  4.0   Chloride 98 - 111 mmol/L 98   96   CO2 22 - 32 mmol/L 24   23   Calcium 8.9 - 10.3 mg/dL 9.2   8.7   Total Protein 6.5 - 8.1 g/dL   7.3   Total Bilirubin 0.3 - 1.2 mg/dL   0.6   Alkaline Phos 38 - 126 U/L   84   AST 15 - 41 U/L   32   ALT 0 - 44 U/L   26     ASSESSMENT/PLAN:  Assessment: Principal Problem:   COPD exacerbation (HCC) Active Problems:  Upper airway cough syndrome/ bilateral VC paralysis causing VCD   Vocal cord paralysis, bilateral complete   Acute respiratory failure with hypoxia (HCC)   H/O chronic respiratory failure   H/O tracheostomy  Plan: COPD exacerbation Upper airway cough syndrome Vocal cord paralysis syndrome s/p tracheostomy On our initial exam this morning, the patient had increased work of breathing with accessory muscle use and diffuse, significant wheezing throughout. Per his RN, this had gone on for ~10 minutes.  Respiratory therapist was called bedside and gave breathing treatments as well as suctioning some mucus.  Patient returned to baseline status, satting well and breathing comfortably. He remains diffusely wheezing throughout. He is on triple therapy + duonebs. We will increase his mucinex to improve mucus clearance. If he continues to have respiratory difficulties despite treatment, we may need to consult ENT to come evaluate him inpatient given his previous upper airway complications. For now, his symptoms seem to be mainly lower airway, consistent with a COPD  exacerbation.    -Duonebs q6h -Triple therapy with Brovana BID, pulmicort BID, Levefenacin -Continue home Singulair -Azithromycin 500 mg, day 2/3 -Prednisone 40 mg daily, day 1/4 -Close follow up with pulm at discharge -Discharge on triple therapy -Wean O2 as able, goal SpO2 88-92%  -Ambulatory pulse ox  Chronic conditions: GERD- Protonix daily HTN- hydrochlorothiazide 25 mg daily, losartan 25 mg daily, finasteride 5 mg daily, terazosin 5 mg daily at bedtime  Anxiety- hydroxyzine 25 mg TID PRN  Best Practice: Diet: Cardiac diet IVF: Fluids: None VTE: enoxaparin (LOVENOX) injection 40 mg Start: 07/02/23 1400 Code: Full AB: Azithromycin  Therapy Recs: Pending DISPO: Anticipated discharge in 1-2 days to Home pending resolution of COPD exacerbation.  Signature: Annett Fabian, MD  Internal Medicine Resident, PGY-1 [facility]  Pager: [phone] 11:43 AM, 07/03/2023   Please contact the on call pager after 5 pm and on weekends at [phone].

## 2023-07-04 LAB — BASIC METABOLIC PANEL
Anion gap: 10 (ref 5–15)
BUN: 14 mg/dL (ref 8–23)
CO2: 27 mmol/L (ref 22–32)
Calcium: 9.7 mg/dL (ref 8.9–10.3)
Chloride: 95 mmol/L — ABNORMAL LOW (ref 98–111)
Creatinine, Ser: 0.87 mg/dL (ref 0.61–1.24)
GFR, Estimated: >60 mL/min (ref >60)
Glucose, Bld: 110 mg/dL — ABNORMAL HIGH (ref 70–99)
Potassium: 3.7 mmol/L (ref 3.5–5.1)
Sodium: 132 mmol/L — ABNORMAL LOW (ref 135–145)

## 2023-07-04 MED ORDER — BREZTRI AEROSPHERE 160-9-4.8 MCG/ACT IN AERO
2.0000 | INHALATION_SPRAY | Freq: Two times a day (BID) | RESPIRATORY_TRACT | 2 refills | Status: DC
Start: 2023-07-04 — End: 2023-09-03

## 2023-07-04 MED ORDER — PREDNISONE 20 MG PO TABS: 40.0000 mg | ORAL_TABLET | Freq: Every day | ORAL | 0 refills | Status: AC

## 2023-07-04 MED ORDER — GUAIFENESIN ER 600 MG PO TB12: 1200.0000 mg | ORAL_TABLET | Freq: Two times a day (BID) | ORAL | 0 refills | Status: AC | PRN

## 2023-07-04 MED ORDER — FLUTICASONE PROPIONATE 50 MCG/ACT NA SUSP: 2.0000 | Freq: Every day | NASAL | Status: DC | PRN

## 2023-07-04 NOTE — Progress Notes (Signed)
DISCHARGE NOTE HOME Nathian Calahan to be discharged Home per MD order. Discussed prescriptions and follow up appointments with the patient. Prescriptions given to patient; medication list explained in detail. Patient verbalized understanding.  Skin clean, dry and intact without evidence of skin break down, no evidence of skin tears noted. IV catheter discontinued intact. Site without signs and symptoms of complications. Dressing and pressure applied. Pt denies pain at the site currently. No complaints noted.  Patient free of lines, drains, and wounds.   An After Visit Summary (AVS) was printed and given to the patient. Patient escorted via wheelchair, and discharged home via private auto.  Irwin Brakeman, RN

## 2023-07-04 NOTE — Plan of Care (Signed)
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
## 2023-07-04 NOTE — Discharge Instructions (Addendum)
You were hospitalized for COPD exacerbation.  I am glad that your breathing has improved and you are feeling better.  We have made adjustments to your inhalers.  A new prescription is sent for an inhaler called Breztri which you will take 2 puffs twice a day.  Markus Daft will replace your Symbicort inhaler.  You will complete 2 more days of prednisone (steroids). Thank you for allowing [place] to be part of your care.   Please make sure to schedule follow up appointment with your primary care doctor. Also, please make sure to go to your ENT appointment on Wednesday.  Please note these changes made to your medications:  *Please START taking:  -Breztri 2 puffs twice daily -Prednisone 40 mg daily for next 2 days -Mucinex [phone] mg twice a day as needed to loosen phlegm   *Please STOP taking:  -STOP Symbicort inhaler  Please make sure to contact your primary care provider [facility] about recent hospitalization.

## 2023-07-04 NOTE — TOC Transition Note (Signed)
Transition of Care [facility]) - CM/SW Discharge Note   Patient Details  Name: Inmar Laplume MRN: [id number] Date of Birth: Jan 30, 1951  Transition of Care [facility]) CM/SW Contact:  Ronny Bacon, RN Phone Number: 07/04/2023, 3:39 PM   Clinical Narrative:  Patient is being discharged today. Spoke with patient by phone, reports that his daughter will pick him up when he is ready to go.     Final next level of care: Home/Self Care Barriers to Discharge: No Barriers Identified   Patient Goals and CMS Choice      Discharge Placement                         Discharge Plan and Services Additional resources added to the After Visit Summary for                                       Social Determinants of Health (SDOH) Interventions SDOH Screenings   Food Insecurity: No Food Insecurity (10/06/2021)  Housing: Low Risk  (10/06/2021)  Transportation Needs: No Transportation Needs (10/06/2021)  Depression (PHQ2-9): Low Risk  (10/06/2021)  Physical Activity: Sufficiently Active (06/02/2021)  Tobacco Use: Low Risk  (07/02/2023)     Readmission Risk Interventions     No data to display

## 2023-07-07 DIAGNOSIS — Z430 Encounter for attention to tracheostomy: Secondary | ICD-10-CM | POA: Diagnosis not present

## 2023-07-08 IMAGING — DX DG CHEST 1V PORT
1 series · 1 of 1 positions shown · non-contrast
Comparison: 04/30/2020

CLINICAL DATA: Shortness of breath

EXAM:
PORTABLE CHEST 1 VIEW

[chest ap]
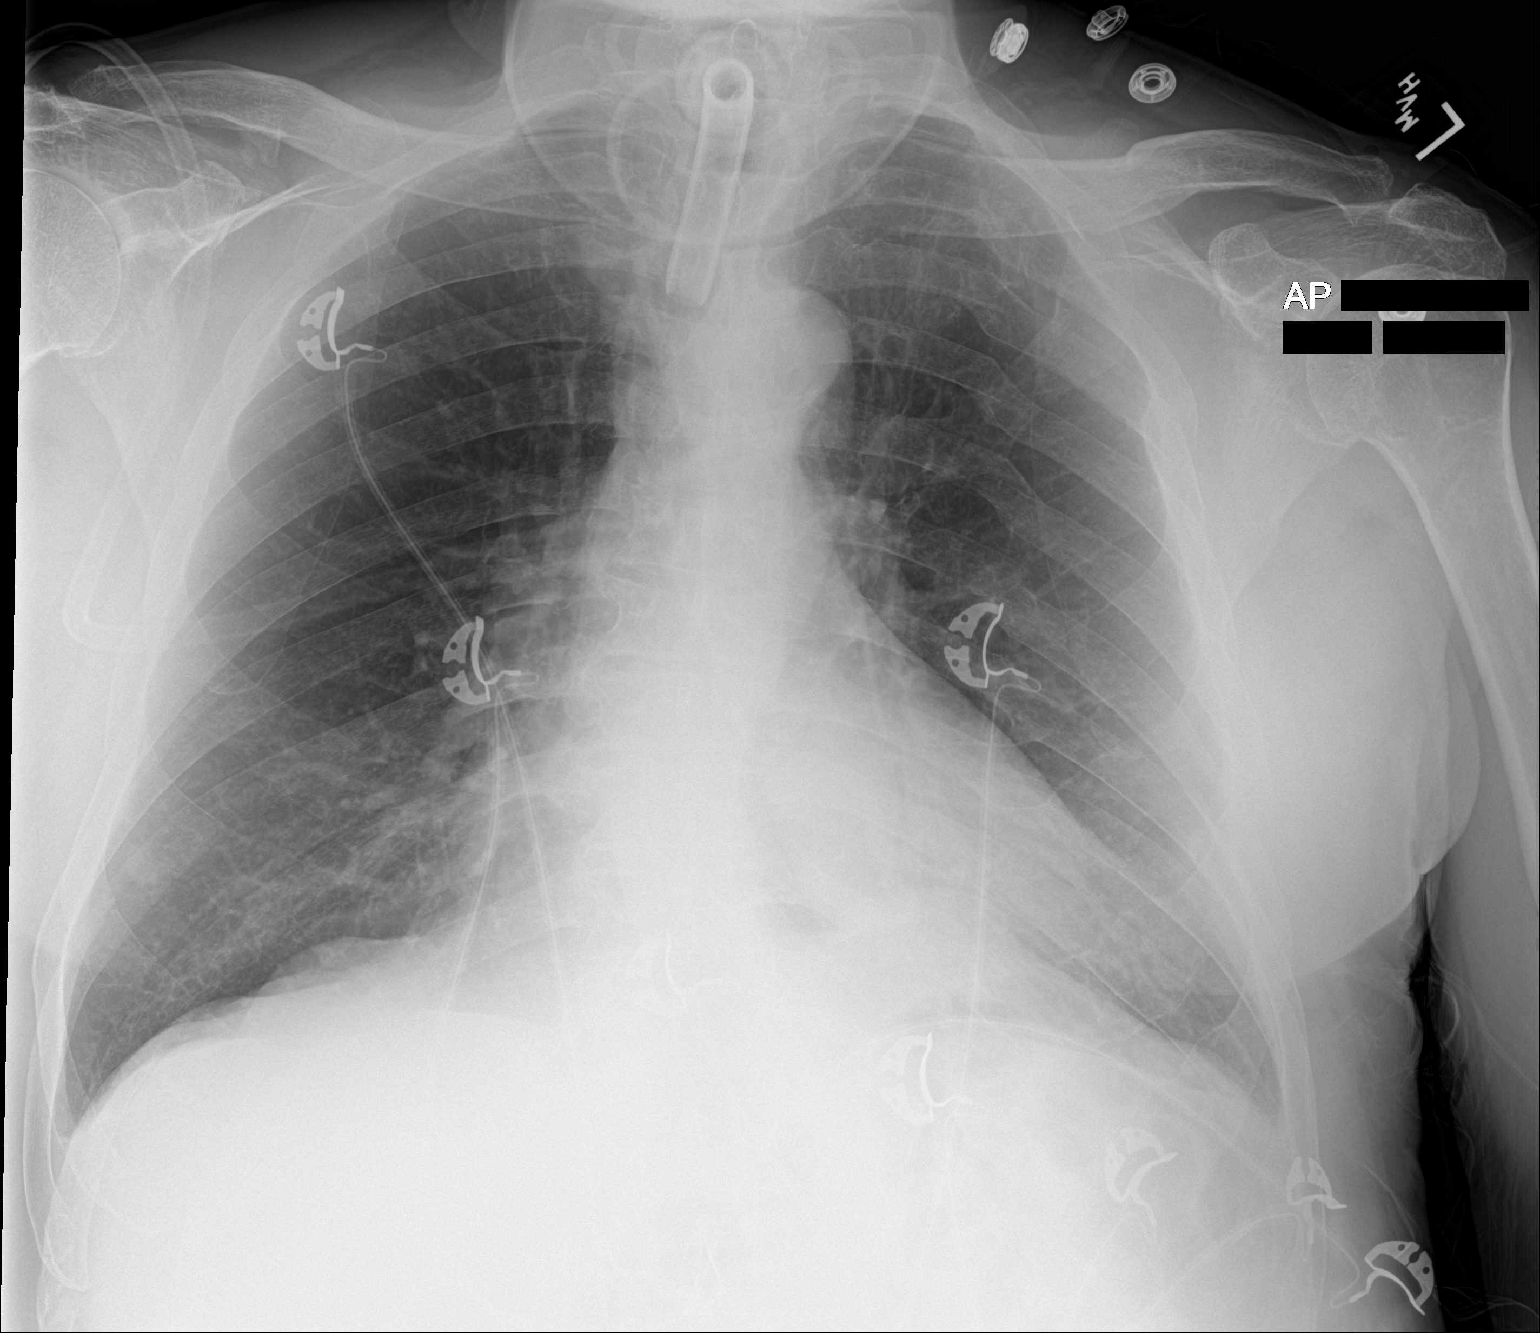

[1 of 1 positions shown; findings below may reference images not displayed]

FINDINGS: Tracheostomy tube tip about 6 cm superior to carina. No focal
opacity, pleural effusion, or pneumothorax. Stable cardiomediastinal
silhouette. Multiple old left rib fractures. Old left clavicle
fracture.
IMPRESSION: No active disease.

## 2023-07-20 ENCOUNTER — Other Ambulatory Visit: Payer: Self-pay | Admitting: Gastroenterology

## 2023-08-09 ENCOUNTER — Encounter (HOSPITAL_COMMUNITY): Payer: Self-pay | Admitting: Gastroenterology

## 2023-08-09 NOTE — Progress Notes (Signed)
Pre op call eval Name:[name] [name]   [facility]  EKG-07/05/23 Echo-2017 Cath-n/a Stress-n/a ICD/PM- n/a Blood thinner-n/a GLP-1- n/a  Hx:HTN, Asthma, COPD, Trach. Hospitalized from 10/18-20 for COPD exac., vocal cord paralysis. In his d/c summary says to f/u with ENT. Pt says is going tomorrow to [facility] so they can look down through there to see about his vocal cord paralysis, he sates not a sedated procedure. Per pt is feeling better, not having as bad SOB as before, no 02 use. Last in person office visit with pulm was 09/2022. Anesthesia Review: Yes
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
## 2023-08-17 ENCOUNTER — Ambulatory Visit (HOSPITAL_COMMUNITY): Payer: 59

## 2023-08-17 ENCOUNTER — Encounter (HOSPITAL_COMMUNITY)
Admission: RE | Disposition: A | Discharge status: Home / Self Care | Payer: Self-pay | Source: Home / Self Care | Attending: Gastroenterology

## 2023-08-17 ENCOUNTER — Other Ambulatory Visit: Payer: Self-pay

## 2023-08-17 ENCOUNTER — Encounter (HOSPITAL_COMMUNITY): Payer: Self-pay | Admitting: Gastroenterology

## 2023-08-17 ENCOUNTER — Ambulatory Visit (HOSPITAL_BASED_OUTPATIENT_CLINIC_OR_DEPARTMENT_OTHER): Payer: 59

## 2023-08-17 ENCOUNTER — Ambulatory Visit (HOSPITAL_COMMUNITY)
Admission: RE | Admit: 2023-08-17 | Discharge: 2023-08-17 | Disposition: A | Discharge status: Home / Self Care | Payer: 59 | Attending: Gastroenterology | Admitting: Gastroenterology

## 2023-08-17 DIAGNOSIS — Z860101 Personal history of adenomatous and serrated colon polyps: Secondary | ICD-10-CM

## 2023-08-17 DIAGNOSIS — K5730 Diverticulosis of large intestine without perforation or abscess without bleeding: Secondary | ICD-10-CM | POA: Insufficient documentation

## 2023-08-17 DIAGNOSIS — D122 Benign neoplasm of ascending colon: Secondary | ICD-10-CM | POA: Diagnosis not present

## 2023-08-17 DIAGNOSIS — D125 Benign neoplasm of sigmoid colon: Secondary | ICD-10-CM

## 2023-08-17 DIAGNOSIS — Z09 Encounter for follow-up examination after completed treatment for conditions other than malignant neoplasm: Secondary | ICD-10-CM | POA: Insufficient documentation

## 2023-08-17 DIAGNOSIS — K635 Polyp of colon: Secondary | ICD-10-CM | POA: Diagnosis not present

## 2023-08-17 DIAGNOSIS — J3800 Paralysis of vocal cords and larynx, unspecified: Secondary | ICD-10-CM | POA: Diagnosis not present

## 2023-08-17 DIAGNOSIS — Z930 Tracheostomy status: Secondary | ICD-10-CM | POA: Diagnosis not present

## 2023-08-17 DIAGNOSIS — I10 Essential (primary) hypertension: Secondary | ICD-10-CM | POA: Insufficient documentation

## 2023-08-17 DIAGNOSIS — K648 Other hemorrhoids: Secondary | ICD-10-CM | POA: Diagnosis not present

## 2023-08-17 DIAGNOSIS — Z1211 Encounter for screening for malignant neoplasm of colon: Secondary | ICD-10-CM | POA: Diagnosis not present

## 2023-08-17 DIAGNOSIS — K644 Residual hemorrhoidal skin tags: Secondary | ICD-10-CM | POA: Insufficient documentation

## 2023-08-17 DIAGNOSIS — F419 Anxiety disorder, unspecified: Secondary | ICD-10-CM | POA: Diagnosis not present

## 2023-08-17 DIAGNOSIS — J4489 Other specified chronic obstructive pulmonary disease: Secondary | ICD-10-CM | POA: Diagnosis not present

## 2023-08-17 DIAGNOSIS — Z860100 Personal history of colon polyps, unspecified: Secondary | ICD-10-CM | POA: Diagnosis not present

## 2023-08-17 DIAGNOSIS — K6389 Other specified diseases of intestine: Secondary | ICD-10-CM | POA: Diagnosis not present

## 2023-08-17 HISTORY — PX: COLONOSCOPY WITH PROPOFOL: SHX5780

## 2023-08-17 HISTORY — PX: POLYPECTOMY: SHX5525

## 2023-08-17 SURGERY — COLONOSCOPY WITH PROPOFOL
Anesthesia: Monitor Anesthesia Care

## 2023-08-17 MED ORDER — PROPOFOL 500 MG/50ML IV EMUL
INTRAVENOUS | Status: DC | PRN
  Administered 2023-08-17: 100 ug/kg/min via INTRAVENOUS

## 2023-08-17 MED ORDER — PROPOFOL 1000 MG/100ML IV EMUL
INTRAVENOUS | Status: AC
  Filled 2023-08-17: qty 100

## 2023-08-17 MED ORDER — SODIUM CHLORIDE 0.9 % IV SOLN: INTRAVENOUS | Status: DC | PRN

## 2023-08-17 MED ORDER — PROPOFOL 10 MG/ML IV BOLUS
INTRAVENOUS | Status: DC | PRN
  Administered 2023-08-17: 60 mg via INTRAVENOUS

## 2023-08-17 MED ORDER — LIDOCAINE HCL (CARDIAC) PF 100 MG/5ML IV SOSY
PREFILLED_SYRINGE | INTRAVENOUS | Status: DC | PRN
Start: 2023-08-17 — End: 2023-08-17
  Administered 2023-08-17: 80 mg via INTRATRACHEAL

## 2023-08-17 SURGICAL SUPPLY — 21 items
ELECT REM PT RETURN 9FT ADLT (ELECTROSURGICAL)
ELECTRODE REM PT RTRN 9FT ADLT (ELECTROSURGICAL) IMPLANT
FCP BXJMBJMB 240X2.8X (CUTTING FORCEPS)
FLOOR PAD 36X40 (MISCELLANEOUS) ×2
FORCEPS BIOP RAD 4 LRG CAP 4 (CUTTING FORCEPS) IMPLANT
FORCEPS BIOP RJ4 240 W/NDL (CUTTING FORCEPS)
FORCEPS BXJMBJMB 240X2.8X (CUTTING FORCEPS) IMPLANT
INJECTOR/SNARE I SNARE (MISCELLANEOUS) IMPLANT
LUBRICANT JELLY 4.5OZ STERILE (MISCELLANEOUS) IMPLANT
MANIFOLD NEPTUNE II (INSTRUMENTS) IMPLANT
NDL SCLEROTHERAPY 25GX240 (NEEDLE) IMPLANT
NEEDLE SCLEROTHERAPY 25GX240 (NEEDLE)
PAD FLOOR 36X40 (MISCELLANEOUS) ×3 IMPLANT
PROBE APC STR FIRE (PROBE) IMPLANT
PROBE INJECTION GOLD 7FR (MISCELLANEOUS) IMPLANT
SNARE ROTATE MED OVAL 20MM (MISCELLANEOUS) IMPLANT
SYR 50ML LL SCALE MARK (SYRINGE) IMPLANT
TRAP SPECIMEN MUCOUS 40CC (MISCELLANEOUS) IMPLANT
TUBING ENDO SMARTCAP PENTAX (MISCELLANEOUS) IMPLANT
TUBING IRRIGATION ENDOGATOR (MISCELLANEOUS) ×3 IMPLANT
WATER STERILE IRR 1000ML POUR (IV SOLUTION) IMPLANT

## 2023-08-17 NOTE — Discharge Instructions (Signed)
YOU HAD AN ENDOSCOPIC PROCEDURE TODAY: Refer to the procedure report and other information in the discharge instructions given to you for any specific questions about what was found during the examination. If this information does not answer your questions, please call [facility] office at [phone] to clarify.  ° °YOU SHOULD EXPECT: Some feelings of bloating in the abdomen. Passage of more gas than usual. Walking can help get rid of the air that was put into your GI tract during the procedure and reduce the bloating. If you had a lower endoscopy (such as a colonoscopy or flexible sigmoidoscopy) you may notice spotting of blood in your stool or on the toilet paper. Some abdominal soreness may be present for a day or two, also. ° °DIET: Your first meal following the procedure should be a light meal and then it is ok to progress to your normal diet. A half-sandwich or bowl of soup is an example of a good first meal. Heavy or fried foods are harder to digest and may make you feel nauseous or bloated. Drink plenty of fluids but you should avoid alcoholic beverages for 24 hours. If you had a esophageal dilation, please see attached instructions for diet.  ° °ACTIVITY: Your care partner should take you home directly after the procedure. You should plan to take it easy, moving slowly for the rest of the day. You can resume normal activity the day after the procedure however YOU SHOULD NOT DRIVE, use power tools, machinery or perform tasks that involve climbing or major physical exertion for 24 hours (because of the sedation medicines used during the test).  ° °SYMPTOMS TO REPORT IMMEDIATELY: °A gastroenterologist can be reached at any hour. Please call [phone]  for any of the following symptoms:  °• Following lower endoscopy (colonoscopy, flexible sigmoidoscopy) °Excessive amounts of blood in the stool  °Significant tenderness, worsening of abdominal pains  °Swelling of the abdomen that is new, acute  °Fever of 100°  or higher  °• Following upper endoscopy (EGD, EUS, ERCP, esophageal dilation) °Vomiting of blood or coffee ground material  °New, significant abdominal pain  °New, significant chest pain or pain under the shoulder blades  °Painful or persistently difficult swallowing  °New shortness of breath  °Black, tarry-looking or red, bloody stools ° °FOLLOW UP:  °If any biopsies were taken you will be contacted by phone or by letter within the next 1-3 weeks. Call [phone]  if you have not heard about the biopsies in 3 weeks.  °Please also call with any specific questions about appointments or follow up tests. °

## 2023-08-17 NOTE — H&P (Signed)
Primary Care Physician:  [facility] Primary Gastroenterologist:  Dr. Levora Angel  Reason for Visit -outpatient surveillance colonoscopy  HPI: [name] [name] is a 72 y.o. male COPD, vocal cord paralysis status post tracheostomy, anxiety and hypertension is here for outpatient surveillance colonoscopy.  Last colonoscopy in 2021 showed multiple tubular adenomas and repeat was recommended in 3 years.  Denies any GI symptoms today.  Denies any chest pain and shortness of breath today.  Past Medical History:  Diagnosis Date   Abnormal EKG 12/24/11   anteroseptal and lateral ST elevation, felt r/t early repolarization;  Cardiac cath 12/24/11 - normal coronary anatomy, EF 55-65%   Anxiety    Arthritis    Asthma    Bradycardia, sinus 12/24/11   COPD (chronic obstructive pulmonary disease) (HCC)    Dyspnea    H/O tracheostomy    History of alcohol abuse    hospitalized for detox 2002   HTN (hypertension)    Hypotension 12/24/11   in the setting of dehydration    Marijuana use    Pneumonia    Syncope and collapse 12/24/11   2/2 hypotension in the setting of dehydration   Upper airway cough syndrome     Past Surgical History:  Procedure Laterality Date   BIOPSY  08/20/2020   Procedure: BIOPSY;  Surgeon: Kathi Der, MD;  Location: [facility];  Service: Gastroenterology;;   CALCANEAL OSTEOTOMY Left 10/30/2022   Procedure: CALCANEAL OSTEOTOMY, SPLIT ANTERIOR TIBIAL TENDON TRANSFER, PERINEAL TENDON TRANSFER, Z- LENGTHENING OF POSTERIOR TIBIAL TENDON;  Surgeon: Kieth Brightly, DPM;  Location: [facility];  Service: Podiatry;  Laterality: Left;   CARDIAC CATHETERIZATION  12/24/11   normal coronary anatomy, EF 55-65%   CIRCUMCISION  1972   COLONOSCOPY WITH PROPOFOL N/A 02/24/2018   Procedure: COLONOSCOPY WITH PROPOFOL;  Surgeon: Kathi Der, MD;  Location: [facility];  Service: Gastroenterology;  Laterality: N/A;   COLONOSCOPY WITH PROPOFOL N/A 08/20/2020   Procedure:  COLONOSCOPY WITH PROPOFOL;  Surgeon: Kathi Der, MD;  Location: [facility];  Service: Gastroenterology;  Laterality: N/A;   CYSTOSCOPY W/ RETROGRADES Bilateral 03/08/2019   Procedure: CYSTOSCOPY WITH RETROGRADE PYELOGRAM;  Surgeon: Sebastian Ache, MD;  Location: [facility];  Service: Urology;  Laterality: Bilateral;   CYSTOSCOPY WITH BIOPSY N/A 03/08/2019   Procedure: CYSTOSCOPY WITH BIOPSY WITH FUGERATION;  Surgeon: Sebastian Ache, MD;  Location: [facility];  Service: Urology;  Laterality: N/A;   ESOPHAGOGASTRODUODENOSCOPY N/A 10/09/2015   Procedure: ESOPHAGOGASTRODUODENOSCOPY (EGD);  Surgeon: Jimmye Norman, MD;  Location: [facility] ENDOSCOPY;  Service: General;  Laterality: N/A;   IRRIGATION AND DEBRIDEMENT KNEE Right 05/23/2017   Procedure: IRRIGATION AND DEBRIDEMENT KNEE;  Surgeon: Toni Arthurs, MD;  Location: [facility];  Service: Orthopedics;  Laterality: Right;   LACERATION REPAIR  02/2004   arthroscopic debridement of triagular fibrocartilage tear/E-chart; right wrist   LEFT HEART CATHETERIZATION WITH CORONARY ANGIOGRAM N/A 12/24/2011   Procedure: LEFT HEART CATHETERIZATION WITH CORONARY ANGIOGRAM;  Surgeon: Peter M Swaziland, MD;  Location: [facility] CATH LAB;  Service: Cardiovascular;  Laterality: N/A;   PEG PLACEMENT N/A 10/09/2015   Procedure: PERCUTANEOUS ENDOSCOPIC GASTROSTOMY (PEG) PLACEMENT;  Surgeon: Jimmye Norman, MD;  Location: [facility] ENDOSCOPY;  Service: General;  Laterality: N/A;   PEG TUBE REMOVAL     PERCUTANEOUS TRACHEOSTOMY N/A 10/09/2015   Procedure: PERCUTANEOUS TRACHEOSTOMY;  Surgeon: Jimmye Norman, MD;  Location: [facility] OR;  Service: General;  Laterality: N/A;   POLYPECTOMY  02/24/2018   Procedure: POLYPECTOMY;  Surgeon: Kathi Der, MD;  Location: [facility];  Service:  Gastroenterology;;   POLYPECTOMY  08/20/2020   Procedure: POLYPECTOMY;  Surgeon: Kathi Der, MD;  Location: [facility];  Service: Gastroenterology;;   SINUS ENDO WITH FUSION  06/07/2018   SINUS ENDO WITH FUSION Bilateral  06/07/2018   Procedure: SINUS ENDO WITH FUSION;  Surgeon: Suzanna Obey, MD;  Location: [facility] OR;  Service: ENT;  Laterality: Bilateral;  with fuision   UMBILICAL HERNIA REPAIR N/A 05/24/2020   Procedure: OPEN UMBILICAL HERNIA REPAIR WITH  MESH;  Surgeon: Kinsinger, De Blanch, MD;  Location: [facility] OR;  Service: General;  Laterality: N/A;    Prior to Admission medications   Medication Sig Start Date End Date Taking? Authorizing Provider  acetaminophen 325 MG tablet Take 2 tablets (650 mg total) by mouth every 6 (six) hours as needed. Patient taking differently: Take 650 mg by mouth every 6 (six) hours as needed for pain. 12/28/19  Yes Maczis, Elmer Sow, PA-C  albuterol (PROVENTIL) (2.5 MG/3ML) 0.083% nebulizer solution Take 3 mLs (2.5 mg total) by nebulization every 4 (four) hours as needed for wheezing or shortness of breath. 07/28/22  Yes Nyoka Cowden, MD  aspirin EC 81 MG tablet Take 81 mg by mouth daily. 03/29/23  Yes [provider]  Budeson-Glycopyrrol-Formoterol (BREZTRI AEROSPHERE) 160-9-4.8 MCG/ACT AERO Inhale 2 puffs into the lungs in the morning and at bedtime. 07/04/23  Yes Rana Snare, DO  cetirizine (ZYRTEC) 10 MG tablet Take 10 mg by mouth daily.   Yes [provider]  cyclobenzaprine (FLEXERIL) 10 MG tablet Take 10 mg by mouth daily. As Directed   Yes [provider]  finasteride (PROSCAR) 5 MG tablet Take 5 mg by mouth daily.   Yes [provider]  fluticasone (FLONASE) 50 MCG/ACT nasal spray Place 2 sprays into both nostrils daily as needed for allergies. 07/04/23  Yes Rana Snare, DO  hydrochlorothiazide (HYDRODIURIL) 25 MG tablet Take 1 tablet (25 mg total) by mouth daily. Patient taking differently: Take 12.5 mg by mouth daily. 05/30/18  Yes Rolly Salter, MD  losartan (COZAAR) 25 MG tablet Take 25 mg by mouth at bedtime. 01/17/21  Yes [provider]  montelukast (SINGULAIR) 10 MG tablet TAKE 1 TABLET(10 MG) BY MOUTH DAILY Patient  taking differently: Take 10 mg by mouth at bedtime. 11/02/22  Yes Nyoka Cowden, MD  Multiple Vitamin (MULTIVITAMIN) tablet Take 1 tablet by mouth daily.   Yes [provider]  pantoprazole (PROTONIX) 40 MG tablet TAKE ONE TABLET BY MOUTH BEFORE BREAKFAST (30 MINUTES BEFORE) Patient taking differently: Take 40 mg by mouth daily before breakfast. 03/11/22  Yes Nyoka Cowden, MD  tadalafil (CIALIS) 20 MG tablet Take 20 mg by mouth once as needed for erectile dysfunction. Take 1 hour prior to sexual activity *Do not exceed 1 dose in 24 hours*   Yes [provider]  terazosin (HYTRIN) 5 MG capsule Take 5 mg by mouth at bedtime.   Yes [provider]  sodium chloride 0.9 % nebulizer solution Take 3-4 mLs by nebulization daily as needed (Use 3-4 drops into Trach to loosen phlegm suction secretions immediatly after.). 08/17/19   [provider]    Scheduled Meds: Continuous Infusions: PRN Meds:.  Allergies as of 07/20/2023   (No Known Allergies)    Family History  Problem Relation Age of Onset   COPD Sister    Cancer Sister    Asthma Other     Social History   Socioeconomic History   Marital status: Divorced    Spouse  name: Not on file   Number of children: 5   Years of education: Not on file   Highest education level: High school graduate  Occupational History   Occupation: retired  Tobacco Use   Smoking status: Never    Passive exposure: Never   Smokeless tobacco: Never  Vaping Use   Vaping status: Never Used  Substance and Sexual Activity   Alcohol use: Yes    Alcohol/week: 2.0 standard drinks of alcohol    Types: 2 Cans of beer per week    Comment: twice a week   Drug use: Not Currently    Types: Marijuana    Comment: Last use in August 2019   Sexual activity: Not Currently    Birth control/protection: Condom  Other Topics Concern   Not on file  Social History Narrative   ** Merged History Encounter **       He was in foster  homes as a child because of his mother's ETOH abuse. Pt also has a history of ETOH abuse and was hospitalized in 2002 for detox.   Social Determinants of Health   Financial Resource Strain: Not on file  Food Insecurity: No Food Insecurity (10/06/2021)   Hunger Vital Sign    Worried About Running Out of Food in the Last Year: Never true    Ran Out of Food in the Last Year: Never true  Transportation Needs: No Transportation Needs (10/06/2021)   PRAPARE - Administrator, Civil Service (Medical): No    Lack of Transportation (Non-Medical): No  Physical Activity: Sufficiently Active (06/02/2021)   Exercise Vital Sign    Days of Exercise per Week: 7 days    Minutes of Exercise per Session: 30 min  Stress: Not on file  Social Connections: Not on file  Intimate Partner Violence: Not on file      Physical Exam: Vital signs: Vitals:   08/17/23 0700  BP: (!) 169/80  Pulse: (!) 57  Resp: (!) 22  Temp: 98.3 F (36.8 C)  SpO2: 100%     General:   Alert,  Well-developed, well-nourished, pleasant and cooperative in NAD Lungs: No visible respiratory distress Heart:  Regular rate and rhythm; no murmurs, clicks, rubs,  or gallops. Abdomen: Soft, nontender, nondistended, bowel sound present, no peritoneal signs Rectal:  Deferred  GI:  Lab Results: No results for input(s): "WBC", "HGB", "HCT", "PLT" in the last 72 hours. BMET No results for input(s): "NA", "K", "CL", "CO2", "GLUCOSE", "BUN", "CREATININE", "CALCIUM" in the last 72 hours. LFT No results for input(s): "PROT", "ALBUMIN", "AST", "ALT", "ALKPHOS", "BILITOT", "BILIDIR", "IBILI" in the last 72 hours. PT/INR No results for input(s): "LABPROT", "INR" in the last 72 hours.   Studies/Results: No results found.  Impression/Plan: -History of adenomatous polyps -Vocal cord paralysis status post tracheostomy -History of multiple COPD exacerbation  Recommendations -------------------------- -Proceed with colonoscopy  today.  Risks (bleeding, infection, bowel perforation that could require surgery, sedation-related changes in cardiopulmonary systems), benefits (identification and possible treatment of source of symptoms, exclusion of certain causes of symptoms), and alternatives (watchful waiting, radiographic imaging studies, empiric medical treatment)  were explained to patient/family in detail and patient wishes to proceed.     LOS: 0 days   Kathi Der  MD, FACP 08/17/2023, 7:40 AM  Contact #  [phone]

## 2023-08-17 NOTE — Anesthesia Postprocedure Evaluation (Signed)
Anesthesia Post Note  Patient: [name] [name]  Procedure(s) Performed: COLONOSCOPY WITH PROPOFOL POLYPECTOMY     Patient location during evaluation: PACU Anesthesia Type: MAC Level of consciousness: awake and alert Pain management: pain level controlled Vital Signs Assessment: post-procedure vital signs reviewed and stable Respiratory status: spontaneous breathing, nonlabored ventilation, respiratory function stable and patient connected to nasal cannula oxygen Cardiovascular status: stable and blood pressure returned to baseline Postop Assessment: no apparent nausea or vomiting Anesthetic complications: no   No notable events documented.  Last Vitals:  Vitals:   08/17/23 0820 08/17/23 0830  BP: 117/74 139/86  Pulse: (!) 56 (!) 52  Resp: 17 17  Temp:    SpO2: 100% 100%    Last Pain:  Vitals:   08/17/23 0830  TempSrc:   PainSc: 0-No pain                 [place]

## 2023-08-17 NOTE — Transfer of Care (Signed)
Immediate Anesthesia Transfer of Care Note  Patient: [name] [name]  Procedure(s) Performed: COLONOSCOPY WITH PROPOFOL POLYPECTOMY  Patient Location: PACU  Anesthesia Type:MAC  Level of Consciousness: drowsy  Airway & Oxygen Therapy: Patient Spontanous Breathing and Patient connected to face mask oxygen  Post-op Assessment: Report given to RN and Post -op Vital signs reviewed and stable  Post vital signs: Reviewed and stable  Last Vitals:  Vitals Value Taken Time  BP    Temp    Pulse 58 08/17/23 0814  Resp 16 08/17/23 0814  SpO2 100 % 08/17/23 0814  Vitals shown include unfiled device data.  Last Pain:  Vitals:   08/17/23 0700  TempSrc: Temporal  PainSc: 0-No pain         Complications: No notable events documented.

## 2023-08-17 NOTE — Op Note (Signed)
[facility] Patient Name: [name] [name] Procedure Date: 08/17/2023 MRN: [id number] Attending MD: Kathi Der , MD, [id number] Date of Birth: 06/21/1951 CSN: [id number] Age: 72 Admit Type: Outpatient Procedure:                Colonoscopy Indications:              High risk colon cancer surveillance: Personal                            history of multiple (3 or more) adenomas Providers:                Kathi Der, MD, Fransisca Connors, Geoffery Lyons, Technician Referring MD:              Medicines:                Sedation Administered by an Anesthesia Professional Complications:            No immediate complications. Estimated Blood Loss:     Estimated blood loss was minimal. Procedure:                Pre-Anesthesia Assessment:                           - Prior to the procedure, a History and Physical                            was performed, and patient medications and                            allergies were reviewed. The patient's tolerance of                            previous anesthesia was also reviewed. The risks                            and benefits of the procedure and the sedation                            options and risks were discussed with the patient.                            All questions were answered, and informed consent                            was obtained. Prior Anticoagulants: The patient has                            taken no anticoagulant or antiplatelet agents. ASA                            Grade Assessment: III - A patient with severe  systemic disease. After reviewing the risks and                            benefits, the patient was deemed in satisfactory                            condition to undergo the procedure.                           After obtaining informed consent, the colonoscope                            was passed under direct vision. Throughout the                             procedure, the patient's blood pressure, pulse, and                            oxygen saturations were monitored continuously. The                            PCF-HQ190L ([id number]) Olympus colonoscope was                            introduced through the anus and advanced to the the                            cecum, identified by appendiceal orifice and                            ileocecal valve. The colonoscopy was performed with                            moderate difficulty due to significant looping.                            Successful completion of the procedure was aided by                            applying abdominal pressure. The patient tolerated                            the procedure well. The quality of the bowel                            preparation was adequate to identify polyps greater                            than 5 mm in size. The ileocecal valve, appendiceal                            orifice, and rectum were photographed. Scope In: 7:52:14 AM Scope Out: 8:08:02 AM Scope Withdrawal Time: 0 hours 10 minutes 54 seconds  Total Procedure  Duration: 0 hours 15 minutes 48 seconds  Findings:      Skin tags were found on perianal exam.      Two sessile polyps were found in the ascending colon. The polyps were       small in size. These polyps were removed with a cold snare. Resection       and retrieval were complete.      Scattered diverticula were found in the entire colon.      Two sessile polyps were found in the sigmoid colon. The polyps were 3 to       6 mm in size. These polyps were removed with a cold snare. Resection and       retrieval were complete.      Internal hemorrhoids were found during retroflexion. The hemorrhoids       were small. Impression:               - Perianal skin tags found on perianal exam.                           - Two small polyps in the ascending colon, removed                            with a cold snare.  Resected and retrieved.                           - Diverticulosis in the entire examined colon.                           - Two 3 to 6 mm polyps in the sigmoid colon,                            removed with a cold snare. Resected and retrieved.                           - Internal hemorrhoids. Moderate Sedation:      Moderate (conscious) sedation was personally administered by an       anesthesia professional. The following parameters were monitored: oxygen       saturation, heart rate, blood pressure, and response to care. Recommendation:           - Patient has a contact number available for                            emergencies. The signs and symptoms of potential                            delayed complications were discussed with the                            patient. Return to normal activities tomorrow.                            Written discharge instructions were provided to the                            patient.                           -  Resume previous diet.                           - Continue present medications.                           - Await pathology results.                           - Repeat colonoscopy date to be determined after                            pending pathology results are reviewed for                            surveillance based on pathology results.                           - Return to my office PRN. Procedure Code(s):        --- Professional ---                           [phone], Colonoscopy, flexible; with removal of                            tumor(s), polyp(s), or other lesion(s) by snare                            technique Diagnosis Code(s):        --- Professional ---                           Z86.010, Personal history of colonic polyps                           D12.2, Benign neoplasm of ascending colon                           D12.5, Benign neoplasm of sigmoid colon                           K64.8, Other hemorrhoids                            K64.4, Residual hemorrhoidal skin tags                           K57.30, Diverticulosis of large intestine without                            perforation or abscess without bleeding CPT copyright 2022 American [employer] Association. All rights reserved. The codes documented in this report are preliminary and upon coder review may  be revised to meet current compliance requirements. Kathi Der, MD Kathi Der, MD 08/17/2023 9:39:57 AM Number of Addenda: 0

## 2023-08-18 LAB — SURGICAL PATHOLOGY

## 2023-08-19 ENCOUNTER — Encounter (HOSPITAL_COMMUNITY): Payer: Self-pay | Admitting: Gastroenterology

## 2023-08-23 DIAGNOSIS — Z430 Encounter for attention to tracheostomy: Secondary | ICD-10-CM | POA: Diagnosis not present

## 2023-09-03 ENCOUNTER — Ambulatory Visit: Payer: 59 | Admitting: Nurse Practitioner

## 2023-09-03 ENCOUNTER — Encounter: Payer: Self-pay | Admitting: Nurse Practitioner

## 2023-09-03 VITALS — BP 136/84 | HR 87 | Ht 65.0 in | Wt 163.8 lb

## 2023-09-03 DIAGNOSIS — J45909 Unspecified asthma, uncomplicated: Secondary | ICD-10-CM | POA: Diagnosis not present

## 2023-09-03 DIAGNOSIS — J4489 Other specified chronic obstructive pulmonary disease: Secondary | ICD-10-CM

## 2023-09-03 DIAGNOSIS — Z930 Tracheostomy status: Secondary | ICD-10-CM | POA: Diagnosis not present

## 2023-09-03 DIAGNOSIS — R058 Other specified cough: Secondary | ICD-10-CM

## 2023-09-03 DIAGNOSIS — J398 Other specified diseases of upper respiratory tract: Secondary | ICD-10-CM

## 2023-09-03 MED ORDER — FORMOTEROL FUMARATE 20 MCG/2ML IN NEBU: 20.0000 ug | INHALATION_SOLUTION | Freq: Two times a day (BID) | RESPIRATORY_TRACT | 3 refills | Status: DC

## 2023-09-03 MED ORDER — REVEFENACIN 175 MCG/3ML IN SOLN: 175.0000 ug | Freq: Every day | RESPIRATORY_TRACT | 3 refills | Status: DC

## 2023-09-03 MED ORDER — PREDNISONE 10 MG PO TABS: ORAL_TABLET | ORAL | 0 refills | Status: DC

## 2023-09-03 MED ORDER — BUDESONIDE 0.5 MG/2ML IN SUSP: 0.5000 mg | Freq: Two times a day (BID) | RESPIRATORY_TRACT | 3 refills | Status: DC

## 2023-09-03 NOTE — Assessment & Plan Note (Signed)
AECOPD/asthma likely related to allergies. No purulent sputum or increased sputum production so will hold off on antibiotics or imaging today. Will treat with prednisone taper. Multiple hospitalizations this year. Suspect component of poor control recently is due to insufficient maintenance regimen. He has better benefit with neb treatments vs MDI. Will see if we can get him switched over to triple therapy nebs with Yupelri, formoterol and budesonide. Educated on proper use. Understands to stop Symbicort once these are received. Side effect profiles reviewed. Action plan in place. Close follow up. Continue mucociliary clearance therapies and trigger prevention.   Patient Instructions  Continue Albuterol inhaler 2 puffs or 3 mL neb every 6 hours as needed for shortness of breath or wheezing. Notify if symptoms persist despite rescue inhaler/neb use. Use twice daily until symptoms improve Continue zyrtec 10 mg daily for allergies Continue singulair 10 mg At bedtime  Continue flonase nasal spray 2 sprays each nostril daily Continue mucinex (guaifenesin) 600 mg Twice daily to loosen phlegm Continue sodium chloride nebs 3-4 mL every daily as needed; can also use 3-4 drops into the trach to loosen phlegm before suctioning  Continue Symbicort 2 puffs Twice daily until you get your breathing treatments then you will stop the Symbicort and switch to.... -Budesonide 2 mL neb Twice daily. Brush tongue and rinse mouth afterwards -Formoterol 2 mL neb Twice daily. You can combine this with the budesonide -Yupelri 3 mL neb once daily. You will do this one by itself These are all breathing treatments. I sent them to the [facility] pharmacy   Prednisone taper. 4 tabs for 2 days, then 3 tabs for 2 days, 2 tabs for 2 days, then 1 tab for 2 days, then stop. Take in AM with food. I sent this to your [facility]    Follow up in 4-6 weeks with Dr. Sherene Sires or Philis Nettle. If symptoms do not improve or worsen, please  contact office for sooner follow up or seek emergency care.
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
## 2023-09-03 NOTE — Patient Instructions (Addendum)
Continue Albuterol inhaler 2 puffs or 3 mL neb every 6 hours as needed for shortness of breath or wheezing. Notify if symptoms persist despite rescue inhaler/neb use. Use twice daily until symptoms improve Continue zyrtec 10 mg daily for allergies Continue singulair 10 mg At bedtime  Continue flonase nasal spray 2 sprays each nostril daily Continue mucinex (guaifenesin) 600 mg Twice daily to loosen phlegm Continue sodium chloride nebs 3-4 mL every daily as needed; can also use 3-4 drops into the trach to loosen phlegm before suctioning  Continue Symbicort 2 puffs Twice daily until you get your breathing treatments then you will stop the Symbicort and switch to.... -Budesonide 2 mL neb Twice daily. Brush tongue and rinse mouth afterwards -Formoterol 2 mL neb Twice daily. You can combine this with the budesonide -Yupelri 3 mL neb once daily. You will do this one by itself These are all breathing treatments. I sent them to the [facility] pharmacy   Prednisone taper. 4 tabs for 2 days, then 3 tabs for 2 days, 2 tabs for 2 days, then 1 tab for 2 days, then stop. Take in AM with food. I sent this to your [facility]    Follow up in 4-6 weeks with Dr. Sherene Sires or Philis Nettle. If symptoms do not improve or worsen, please contact office for sooner follow up or seek emergency care.

## 2023-09-03 NOTE — Assessment & Plan Note (Signed)
See above

## 2023-09-10 ENCOUNTER — Telehealth: Payer: Self-pay | Admitting: Nurse Practitioner

## 2023-09-10 NOTE — Telephone Encounter (Signed)
[facility] calling in to let [facility] know [facility]    Dr. Marlyne Beards // [phone]

## 2023-09-14 ENCOUNTER — Telehealth: Payer: Self-pay | Admitting: Student

## 2023-09-14 DIAGNOSIS — Z930 Tracheostomy status: Secondary | ICD-10-CM | POA: Diagnosis not present

## 2023-09-14 NOTE — Telephone Encounter (Signed)
Lm for patient.  

## 2023-09-14 NOTE — Telephone Encounter (Signed)
 PT calling because the new medication NP prescribed needs to be done thru the [facility]. He was no sure which one (poss Yupelri ) We need to call the [facility]   Dr. Tonnie @ [phone]  or [phone]  PT was just saw the NP on 12/20. He was a HFU for an ER visit. Sending back [facility]  His # is [phone] he req a CB once we get resolved.Because the [facility] said we need to put back a referral.

## 2023-09-16 NOTE — Telephone Encounter (Signed)
 Patient is returning missed call.

## 2023-09-17 NOTE — Telephone Encounter (Signed)
 Called and spoke with patient, I advised him that all the nebulizer medications had been sent to the [facility].  He stated that he needed us  to call the [place], Dr. Tonnie office as they need a referral from us  so he can get his medications.  I tried calling [phone] and it was a fax #.  I printed the last OV and faxed it to the [facility], attention Dr. Tonnie.  Received a confirmation fax that the fax was sent successfully.

## 2023-09-17 NOTE — Telephone Encounter (Signed)
 Called and spoke with patient, advised that I faxed the last OV note to Dr. Lance Coon office.  He verbalized understanding.  I attempt to call the # [phone] and it is a fax #.  Closing encounter.

## 2023-09-22 NOTE — Telephone Encounter (Signed)
 NFN

## 2023-09-23 ENCOUNTER — Telehealth: Payer: Self-pay | Admitting: Nurse Practitioner

## 2023-09-23 NOTE — Telephone Encounter (Signed)
 Attempted to call # provided by patient, unable to get thru to pharmacy. Called patient and informed ov note and rx had been sent.Informed [facility].If they still don't have rx then call office back. NFN at this time.

## 2023-09-23 NOTE — Telephone Encounter (Signed)
 Wants to know has Florentina Addison ever reached out to his Dr. At the [facility] for him to get some new medicine

## 2023-09-27 ENCOUNTER — Telehealth: Payer: Self-pay | Admitting: Nurse Practitioner

## 2023-09-27 NOTE — Telephone Encounter (Signed)
 Patient needs a list of medications to be sent to his [place] doctor. They did not receive it. Fax number :[phone] Attention: Dr.Jennings   [facility] phone number: [phone]

## 2023-09-30 DIAGNOSIS — Z430 Encounter for attention to tracheostomy: Secondary | ICD-10-CM | POA: Diagnosis not present

## 2023-10-01 NOTE — Telephone Encounter (Signed)
Patient would like an update on the status of sending the information to the [place]. He would like a call  when this is completed. Call back number [phone]

## 2023-10-05 DIAGNOSIS — I10 Essential (primary) hypertension: Secondary | ICD-10-CM | POA: Diagnosis not present

## 2023-10-05 DIAGNOSIS — J449 Chronic obstructive pulmonary disease, unspecified: Secondary | ICD-10-CM | POA: Diagnosis not present

## 2023-10-05 DIAGNOSIS — I700 Atherosclerosis of aorta: Secondary | ICD-10-CM | POA: Diagnosis not present

## 2023-10-05 DIAGNOSIS — R7303 Prediabetes: Secondary | ICD-10-CM | POA: Diagnosis not present

## 2023-10-05 DIAGNOSIS — E782 Mixed hyperlipidemia: Secondary | ICD-10-CM | POA: Diagnosis not present

## 2023-10-06 NOTE — Telephone Encounter (Signed)
Last ov note and med list faxed to Dr Marlyne Beards at the number that pt provided. Pt aware. Nothing further needed.

## 2023-10-18 ENCOUNTER — Encounter: Payer: Self-pay | Admitting: Nurse Practitioner

## 2023-10-18 ENCOUNTER — Ambulatory Visit (INDEPENDENT_AMBULATORY_CARE_PROVIDER_SITE_OTHER): Payer: 59 | Admitting: Nurse Practitioner

## 2023-10-18 ENCOUNTER — Ambulatory Visit: Payer: 59

## 2023-10-18 VITALS — BP 114/74 | HR 77 | Temp 98.0°F | Ht 65.0 in | Wt 167.4 lb

## 2023-10-18 DIAGNOSIS — J441 Chronic obstructive pulmonary disease with (acute) exacerbation: Secondary | ICD-10-CM

## 2023-10-18 DIAGNOSIS — J3802 Paralysis of vocal cords and larynx, bilateral: Secondary | ICD-10-CM | POA: Diagnosis not present

## 2023-10-18 DIAGNOSIS — J398 Other specified diseases of upper respiratory tract: Secondary | ICD-10-CM

## 2023-10-18 DIAGNOSIS — J4489 Other specified chronic obstructive pulmonary disease: Secondary | ICD-10-CM | POA: Diagnosis not present

## 2023-10-18 DIAGNOSIS — A498 Other bacterial infections of unspecified site: Secondary | ICD-10-CM | POA: Diagnosis not present

## 2023-10-18 DIAGNOSIS — J9811 Atelectasis: Secondary | ICD-10-CM | POA: Diagnosis not present

## 2023-10-18 DIAGNOSIS — Z9889 Other specified postprocedural states: Secondary | ICD-10-CM

## 2023-10-18 DIAGNOSIS — R059 Cough, unspecified: Secondary | ICD-10-CM | POA: Diagnosis not present

## 2023-10-18 DIAGNOSIS — R918 Other nonspecific abnormal finding of lung field: Secondary | ICD-10-CM | POA: Diagnosis not present

## 2023-10-18 DIAGNOSIS — R0989 Other specified symptoms and signs involving the circulatory and respiratory systems: Secondary | ICD-10-CM | POA: Diagnosis not present

## 2023-10-18 MED ORDER — BUDESONIDE 0.5 MG/2ML IN SUSP: 0.5000 mg | Freq: Two times a day (BID) | RESPIRATORY_TRACT | 3 refills | Status: DC

## 2023-10-18 MED ORDER — REVEFENACIN 175 MCG/3ML IN SOLN: 175.0000 ug | Freq: Every day | RESPIRATORY_TRACT | 3 refills | Status: DC

## 2023-10-18 MED ORDER — FORMOTEROL FUMARATE 20 MCG/2ML IN NEBU: 20.0000 ug | INHALATION_SOLUTION | Freq: Two times a day (BID) | RESPIRATORY_TRACT | 3 refills | Status: DC

## 2023-10-18 MED ORDER — CIPROFLOXACIN HCL 500 MG PO TABS: 500.0000 mg | ORAL_TABLET | Freq: Two times a day (BID) | ORAL | 0 refills | Status: DC
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
## 2023-10-18 NOTE — Assessment & Plan Note (Signed)
See above. Followed by [facility]
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
## 2023-10-18 NOTE — Assessment & Plan Note (Signed)
Severe obstructive disease in setting of chronic asthma, hx of ARDS, and vocal cord paralysis requiring permanent trach. Improved dyspnea and bronchospasm s/p prednisone course. Unfortunately presents today with purulent sputum production and scant hemoptysis, concerning for superimposed infection. He does have a history of pseudomonal infection earlier 2024 and treated with ciprofloxacin by ENT. CXR today with concern for LLL pna. Will treat him with empiric cipro 7 day course and collect sputum culture. Instructed on proper collection/return instructions. Continue mucociliary clearance therapies. Low suspicion for PE based on Wells Criteria. Strict ED precautions. Close follow up.  He has better response to nebulized medications than inhalers. Orders placed at last visit to transition to triple therapy nebs - never received. Provided with rx for Yupelri, formoterol and budesonide today - instructed to take to the [facility] pharmacy and notify if they need anything else. Previous OV notes and prescriptions were faxed and also sent electronically. He does have follow up with his PCP at the [facility] 2/11. Action plan in place.  Patient Instructions  Continue Albuterol inhaler 2 puffs or 3 mL neb every 6 hours as needed for shortness of breath or wheezing. Notify if symptoms persist despite rescue inhaler/neb use. Use twice daily until symptoms improve Continue zyrtec 10 mg daily for allergies Continue singulair 10 mg At bedtime  Continue flonase nasal spray 2 sprays each nostril daily Continue mucinex (guaifenesin) 600 mg Twice daily to loosen phlegm Continue sodium chloride nebs 3-4 mL every daily as needed; can also use 3-4 drops into the trach to loosen phlegm before suctioning   Continue Symbicort 2 puffs Twice daily until you get your breathing treatments then you will stop the Symbicort and switch to.... -Budesonide 2 mL neb Twice daily. Brush tongue and rinse mouth afterwards -Formoterol 2 mL neb Twice daily.  You can combine this with the budesonide -Yupelri 3 mL neb once daily. You will do this one by itself These are all breathing treatments.Take the prescriptions up to the [facility] pharmacy - had sent these last time but you never got them    Ciprofloxacin 500 mg Twice daily for 7 days. Take with food.  Return culture Attend your appointment with Ear, Nose and Throat on 2/5 as scheduled. Let them know about your symptoms and antibiotics as well  Chest x ray today    Follow up in 2 weeks with Dr. Sherene Sires or Philis Nettle. If symptoms do not improve or worsen, please contact office for sooner follow up or seek emergency care.

## 2023-10-18 NOTE — Patient Instructions (Addendum)
Continue Albuterol inhaler 2 puffs or 3 mL neb every 6 hours as needed for shortness of breath or wheezing. Notify if symptoms persist despite rescue inhaler/neb use. Use twice daily until symptoms improve Continue zyrtec 10 mg daily for allergies Continue singulair 10 mg At bedtime  Continue flonase nasal spray 2 sprays each nostril daily Continue mucinex (guaifenesin) 600 mg Twice daily to loosen phlegm Continue sodium chloride nebs 3-4 mL every daily as needed; can also use 3-4 drops into the trach to loosen phlegm before suctioning   Continue Symbicort 2 puffs Twice daily until you get your breathing treatments then you will stop the Symbicort and switch to.... -Budesonide 2 mL neb Twice daily. Brush tongue and rinse mouth afterwards -Formoterol 2 mL neb Twice daily. You can combine this with the budesonide -Yupelri 3 mL neb once daily. You will do this one by itself These are all breathing treatments.Take the prescriptions up to the [facility] pharmacy - had sent these last time but you never got them    Ciprofloxacin 500 mg Twice daily for 7 days. Take with food.  Return culture Attend your appointment with Ear, Nose and Throat on 2/5 as scheduled. Let them know about your symptoms and antibiotics as well  Chest x ray today    Follow up in 2 weeks with Dr. Sherene Sires or Philis Nettle. If symptoms do not improve or worsen, please contact office for sooner follow up or seek emergency care.

## 2023-10-19 ENCOUNTER — Other Ambulatory Visit: Payer: 59

## 2023-10-19 DIAGNOSIS — A498 Other bacterial infections of unspecified site: Secondary | ICD-10-CM | POA: Diagnosis not present

## 2023-10-19 DIAGNOSIS — J441 Chronic obstructive pulmonary disease with (acute) exacerbation: Secondary | ICD-10-CM

## 2023-10-22 ENCOUNTER — Other Ambulatory Visit: Payer: Self-pay | Admitting: Nurse Practitioner

## 2023-10-22 DIAGNOSIS — A498 Other bacterial infections of unspecified site: Secondary | ICD-10-CM

## 2023-10-22 DIAGNOSIS — J189 Pneumonia, unspecified organism: Secondary | ICD-10-CM

## 2023-10-22 LAB — RESPIRATORY CULTURE OR RESPIRATORY AND SPUTUM CULTURE
MICRO NUMBER:: 16039192
SPECIMEN QUALITY:: ADEQUATE

## 2023-10-22 MED ORDER — CIPROFLOXACIN HCL 500 MG PO TABS: 500.0000 mg | ORAL_TABLET | Freq: Two times a day (BID) | ORAL | 0 refills | Status: AC

## 2023-11-01 ENCOUNTER — Ambulatory Visit (INDEPENDENT_AMBULATORY_CARE_PROVIDER_SITE_OTHER): Payer: 59 | Admitting: Nurse Practitioner

## 2023-11-01 ENCOUNTER — Encounter: Payer: Self-pay | Admitting: Nurse Practitioner

## 2023-11-01 VITALS — BP 124/78 | HR 72 | Temp 97.9°F | Ht 65.0 in | Wt 167.6 lb

## 2023-11-01 DIAGNOSIS — J441 Chronic obstructive pulmonary disease with (acute) exacerbation: Secondary | ICD-10-CM | POA: Diagnosis not present

## 2023-11-01 DIAGNOSIS — J189 Pneumonia, unspecified organism: Secondary | ICD-10-CM | POA: Diagnosis not present

## 2023-11-01 DIAGNOSIS — J4489 Other specified chronic obstructive pulmonary disease: Secondary | ICD-10-CM

## 2023-11-01 DIAGNOSIS — J398 Other specified diseases of upper respiratory tract: Secondary | ICD-10-CM | POA: Diagnosis not present

## 2023-11-01 DIAGNOSIS — J3802 Paralysis of vocal cords and larynx, bilateral: Secondary | ICD-10-CM

## 2023-11-01 DIAGNOSIS — Z430 Encounter for attention to tracheostomy: Secondary | ICD-10-CM | POA: Diagnosis not present

## 2023-11-01 MED ORDER — IPRATROPIUM-ALBUTEROL 0.5-2.5 (3) MG/3ML IN SOLN: 3.0000 mL | Freq: Four times a day (QID) | RESPIRATORY_TRACT | 5 refills | Status: AC | PRN
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
## 2023-11-01 NOTE — Assessment & Plan Note (Signed)
Resolved AECOPD. He has struggled with recurrent exacerbations over the past year. Recommended change to triple therapy nebs; however, [facility]. He's going to see his PCP at the [place] to discuss. In interim, will have him use duonebs 2-3 times a day and continue Symbicort. If he's still not able to obtain formoterol and Yupelri, will see about keeping him on duonebs and add budesonide neb or ICS to accomplish triple therapy regimen. Action plan in place. Close follow up.

## 2023-11-01 NOTE — Assessment & Plan Note (Signed)
Permanent tracheostomy. Last exchanged 2/7. Followed by ENT.

## 2023-11-01 NOTE — Assessment & Plan Note (Signed)
Treated for staph pneumonia 10/18/2023. He also grew out pseudomonas; possible colonization. He was treated with 10 days of ciprofloxacin and clinically improved. Continue mucociliary clearance therapies as needed. Strict return precautions. Repeat imaging in 4-6 weeks to ensure resolution. Consider repeat culture to see if he clears pseudomonal infection.  Patient Instructions  Continue Albuterol inhaler 2 puffs every 6 hours as needed for shortness of breath or wheezing. Notify if symptoms persist despite rescue inhaler/neb use.  Continue zyrtec 10 mg daily for allergies Continue singulair 10 mg At bedtime  Continue flonase nasal spray 2 sprays each nostril daily Continue mucinex (guaifenesin) 600 mg Twice daily to loosen phlegm Continue sodium chloride nebs 3-4 mL once daily as needed; can also use 3-4 drops into the trach to loosen phlegm before suctioning   Continue Symbicort 2 puffs Twice daily until you get your breathing treatments from the [place] In the meantime, use duoneb 3 mL nebulizer treatment 3-4 times a day and keep doing your Symbicort. I switched your albuterol to this medication which is a combination of ipratropium and albuterol   If they end up covering the nebulized medications I ordered, you will stop the Symbicort and duoneb and switch to budesonide neb twice a day, formoterol neb twice a day and Yupelri neb once a day. Call us before you do this if you have any questions    Follow up in 4-6 weeks after repeat CXR with Dr. Sherene Sires or Philis Nettle. If symptoms do not improve or worsen, please contact office for sooner follow up or seek emergency care.

## 2023-11-01 NOTE — Patient Instructions (Addendum)
Continue Albuterol inhaler 2 puffs every 6 hours as needed for shortness of breath or wheezing. Notify if symptoms persist despite rescue inhaler/neb use.  Continue zyrtec 10 mg daily for allergies Continue singulair 10 mg At bedtime  Continue flonase nasal spray 2 sprays each nostril daily Continue mucinex (guaifenesin) 600 mg Twice daily to loosen phlegm Continue sodium chloride nebs 3-4 mL once daily as needed; can also use 3-4 drops into the trach to loosen phlegm before suctioning   Continue Symbicort 2 puffs Twice daily until you get your breathing treatments from the [place] In the meantime, use duoneb 3 mL nebulizer treatment 2-3 times a day and keep doing your Symbicort. I switched your albuterol to this medication which is a combination of ipratropium and albuterol   If they end up covering the nebulized medications I ordered, you will stop the Symbicort and duoneb and switch to budesonide neb twice a day, formoterol neb twice a day and Yupelri neb once a day. Call us before you do this if you have any questions    Follow up in 4-6 weeks after repeat CXR with Dr. Sherene Sires or Philis Nettle. If symptoms do not improve or worsen, please contact office for sooner follow up or seek emergency care.

## 2023-11-02 ENCOUNTER — Other Ambulatory Visit: Payer: Self-pay

## 2023-11-02 ENCOUNTER — Emergency Department (HOSPITAL_COMMUNITY)
Admission: EM | Admit: 2023-11-02 | Discharge: 2023-11-02 | Disposition: A | Discharge status: Home / Self Care | Payer: No Typology Code available for payment source | Attending: Emergency Medicine | Admitting: Emergency Medicine

## 2023-11-02 ENCOUNTER — Encounter (HOSPITAL_COMMUNITY): Payer: Self-pay

## 2023-11-02 ENCOUNTER — Emergency Department (HOSPITAL_COMMUNITY): Payer: No Typology Code available for payment source

## 2023-11-02 DIAGNOSIS — I10 Essential (primary) hypertension: Secondary | ICD-10-CM | POA: Diagnosis not present

## 2023-11-02 DIAGNOSIS — Z7982 Long term (current) use of aspirin: Secondary | ICD-10-CM | POA: Insufficient documentation

## 2023-11-02 DIAGNOSIS — R0602 Shortness of breath: Secondary | ICD-10-CM | POA: Diagnosis present

## 2023-11-02 DIAGNOSIS — J45909 Unspecified asthma, uncomplicated: Secondary | ICD-10-CM | POA: Diagnosis not present

## 2023-11-02 DIAGNOSIS — J449 Chronic obstructive pulmonary disease, unspecified: Secondary | ICD-10-CM | POA: Insufficient documentation

## 2023-11-02 DIAGNOSIS — R053 Chronic cough: Secondary | ICD-10-CM | POA: Insufficient documentation

## 2023-11-02 DIAGNOSIS — Z79899 Other long term (current) drug therapy: Secondary | ICD-10-CM | POA: Diagnosis not present

## 2023-11-02 LAB — BASIC METABOLIC PANEL
Anion gap: 13 (ref 5–15)
BUN: 12 mg/dL (ref 8–23)
CO2: 21 mmol/L — ABNORMAL LOW (ref 22–32)
Calcium: 9.2 mg/dL (ref 8.9–10.3)
Chloride: 101 mmol/L (ref 98–111)
Creatinine, Ser: 0.78 mg/dL (ref 0.61–1.24)
GFR, Estimated: >60 mL/min (ref >60)
Glucose, Bld: 99 mg/dL (ref 70–99)
Potassium: 4 mmol/L (ref 3.5–5.1)
Sodium: 135 mmol/L (ref 135–145)

## 2023-11-02 LAB — CBC
HCT: 41.8 % (ref 39.0–52.0)
Hemoglobin: 14.6 g/dL (ref 13.0–17.0)
MCH: 31.6 pg (ref 26.0–34.0)
MCHC: 34.9 g/dL (ref 30.0–36.0)
MCV: 90.5 fL (ref 80.0–100.0)
Platelets: 262 10*3/uL (ref 150–400)
RBC: 4.62 MIL/uL (ref 4.22–5.81)
RDW: 12.8 % (ref 11.5–15.5)
WBC: 5.4 10*3/uL (ref 4.0–10.5)
nRBC: 0 % (ref 0.0–0.2)

## 2023-11-02 NOTE — ED Triage Notes (Signed)
Pt. Stated, Ive pneumonia per my primary care Dr. And she gave me an antibiotic and I went back to see her yesterday and I m just can't get my breathing right. Im also having some sweating in my groin area.

## 2023-11-02 NOTE — Progress Notes (Signed)
RT called by RN to come and suction pts trach. RT at bedside to find pt with #6 shiley cuffless trach in place on room air. Pt stated he has had a tracheostomy since 2017. Pt was in no distress. Pt's trach appears clean, dry with clean trach ties. Pt is able to cough up secretions with a good strong cough but felt he needed deep suction. Pt has supplies at bedside from home. Pt refused humidification (ATC) at this time. RT will continue to monitor pt.

## 2023-11-02 NOTE — ED Provider Notes (Signed)
[place] EMERGENCY DEPARTMENT AT [facility] Provider Note   CSN: [id number] Arrival date & time: 11/02/23  4098     History  Chief Complaint  Patient presents with   Shortness of Breath   Pneumonia   sweating in groin    Zafar Debrosse is a 73 y.o. male.  73 year old male presents today for concern of shortness of breath.  Over the past 2 weeks he has been seen by pulmonology twice.  Initially about 12 days ago where he was diagnosed with pneumonia and started on Augmentin.  He has completed the course of antibiotics.  He followed up with them yesterday and was told that he does not have pneumonia.  He states he is otherwise feeling well but just wanted to come and have a repeat chest x-ray performed to ensure that the pulmonologist did not miss anything.  He states otherwise he is feeling well.  Denies chest pain, fever.  The history is provided by the patient. No language interpreter was used.       Home Medications Prior to Admission medications   Medication Sig Start Date End Date Taking? Authorizing Provider  acetaminophen 325 MG tablet Take 2 tablets (650 mg total) by mouth every 6 (six) hours as needed. Patient taking differently: Take 650 mg by mouth every 6 (six) hours as needed for pain. 12/28/19   Maczis, Elmer Sow, PA-C  aspirin EC 81 MG tablet Take 81 mg by mouth daily. 03/29/23   [provider]  budesonide (PULMICORT) 0.5 MG/2ML nebulizer solution Take 2 mLs (0.5 mg total) by nebulization in the morning and at bedtime. 10/18/23   Cobb, Ruby Cola, NP  budesonide-formoterol (SYMBICORT) 160-4.5 MCG/ACT inhaler Inhale 2 puffs into the lungs 2 (two) times daily. 09/22/23   [provider]  cetirizine (ZYRTEC) 10 MG tablet Take 10 mg by mouth daily.    [provider]  cyclobenzaprine (FLEXERIL) 10 MG tablet Take 10 mg by mouth daily. As Directed    [provider]  finasteride (PROSCAR) 5 MG tablet Take 5 mg by mouth daily.     [provider]  fluticasone (FLONASE) 50 MCG/ACT nasal spray Place 2 sprays into both nostrils daily as needed for allergies. 07/04/23   Rana Snare, DO  formoterol (PERFOROMIST) 20 MCG/2ML nebulizer solution Take 2 mLs (20 mcg total) by nebulization 2 (two) times daily. 10/18/23   Cobb, Ruby Cola, NP  guaiFENesin (MUCINEX) 600 MG 12 hr tablet Take 600 mg by mouth 2 (two) times daily.    [provider]  hydrochlorothiazide (HYDRODIURIL) 25 MG tablet Take 1 tablet (25 mg total) by mouth daily. Patient taking differently: Take 12.5 mg by mouth daily. 05/30/18   Rolly Salter, MD  hydrOXYzine (ATARAX) 25 MG tablet Take 25 mg by mouth 2 (two) times daily as needed for anxiety.    [provider]  ipratropium-albuterol (DUONEB) 0.5-2.5 (3) MG/3ML SOLN Take 3 mLs by nebulization every 6 (six) hours as needed. 11/01/23   Cobb, Ruby Cola, NP  losartan (COZAAR) 25 MG tablet Take 25 mg by mouth at bedtime. 01/17/21   [provider]  montelukast (SINGULAIR) 10 MG tablet TAKE 1 TABLET(10 MG) BY MOUTH DAILY Patient taking differently: Take 10 mg by mouth at bedtime. 11/02/22   Nyoka Cowden, MD  Multiple Vitamin (MULTIVITAMIN) tablet Take 1 tablet by mouth daily.    [provider]  Multiple Vitamins-Minerals (ONE-A-DAY MENS 50+) TABS as directed Orally    [provider]  pantoprazole (PROTONIX) 40 MG tablet TAKE ONE TABLET BY MOUTH BEFORE BREAKFAST (30 MINUTES BEFORE) Patient taking differently: Take 40 mg by mouth daily before breakfast. 03/11/22   Nyoka Cowden, MD  predniSONE (DELTASONE) 10 MG tablet 4 tabs for 2 days, then 3 tabs for 2 days, 2 tabs for 2 days, then 1 tab for 2 days, then stop 09/03/23   Cobb, Ruby Cola, NP  revefenacin (YUPELRI) 175 MCG/3ML nebulizer solution Take 3 mLs (175 mcg total) by nebulization daily. 10/18/23   Cobb, Ruby Cola, NP  rosuvastatin (CRESTOR) 5 MG tablet Take 5 mg by mouth daily. 10/05/23   [provider]  sodium chloride 0.9 % nebulizer solution Take 3-4 mLs by nebulization daily as needed (Use 3-4 drops into Trach to loosen phlegm suction secretions immediatly after.). 08/17/19   [provider]  tadalafil (CIALIS) 20 MG tablet Take 20 mg by mouth once as needed for erectile dysfunction. Take 1 hour prior to sexual activity *Do not exceed 1 dose in 24 hours*    [provider]  terazosin (HYTRIN) 5 MG capsule Take 5 mg by mouth at bedtime.    [provider]  triamcinolone cream (KENALOG) 0.1 % Apply 1 Application topically 2 (two) times daily.    [provider]      Allergies    Patient has no known allergies.    Review of Systems   Review of Systems  Constitutional:  Negative for chills and fever.  Respiratory:  Positive for cough and shortness of breath. Negative for wheezing.   Cardiovascular:  Negative for chest pain.  Gastrointestinal:  Negative for abdominal pain.  All other systems reviewed and are negative.   Physical Exam Updated Vital Signs BP 128/88 (BP Location: Right Arm)   Pulse 62   Temp 98 F (36.7 C) (Oral)   Resp 17   Ht 5\' 5"  (1.651 m)   SpO2 97%   BMI 27.89 kg/m  Physical Exam Vitals and nursing note reviewed.  Constitutional:      General: He is not in acute distress.    Appearance: Normal appearance. He is not ill-appearing.  HENT:     Head: Normocephalic and atraumatic.     Nose: Nose normal.  Eyes:     General: No scleral icterus.    Extraocular Movements: Extraocular movements intact.     Conjunctiva/sclera: Conjunctivae normal.  Cardiovascular:     Rate and Rhythm: Normal rate and regular rhythm.     Pulses: Normal pulses.     Heart sounds: Normal heart sounds.  Pulmonary:     Effort: Pulmonary effort is normal. No respiratory distress.     Breath sounds: Normal breath sounds.  Musculoskeletal:        General: Normal range of motion.     Cervical back: Normal range of motion.     Right  lower leg: No edema.     Left lower leg: No edema.  Skin:    General: Skin is warm and dry.  Neurological:     General: No focal deficit present.     Mental Status: He is alert. Mental status is at baseline.     ED Results / Procedures / Treatments   Labs (all labs ordered are listed, but only abnormal results are displayed) Labs Reviewed  BASIC METABOLIC PANEL - Abnormal; Notable for the following components:      Result Value   CO2 21 (*)    All other components within  normal limits  CBC    EKG None  Radiology DG Chest 2 View Result Date: 11/02/2023 CLINICAL DATA:  Shortness of breath. EXAM: CHEST - 2 VIEW COMPARISON:  10/18/2023 FINDINGS: Remote left rib and clavicular fractures. Tracheostomy appropriately positioned. Midline trachea. Mild cardiomegaly. No pleural effusion or pneumothorax. No congestive failure. Left-greater-than-right base airspace disease is similar. The upper lungs are clear. IMPRESSION: Similar left-greater-than-right base airspace disease. Primarily felt to represent atelectasis. A component of left lower lobe infection or aspiration could look similar. Electronically Signed   By: Jeronimo Greaves M.D.   On: 11/02/2023 13:24    Procedures Procedures    Medications Ordered in ED Medications - No data to display  ED Course/ Medical Decision Making/ A&P                                 Medical Decision Making Amount and/or Complexity of Data Reviewed Labs: ordered. Radiology: ordered.   Medical Decision Making / ED Course   This patient presents to the ED for concern of shortness of breath, this involves an extensive number of treatment options, and is a complaint that carries with it a high risk of complications and morbidity.  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  He was recently treated and he has completed the course.  He  was evaluated by pulmonology yesterday.  His CBC is unremarkable, BMP with CO2 of 21 otherwise without acute concern.  Chest x-ray shows similar findings to 12 days ago.  He is afebrile.  Hemodynamically stable.  Without leukocytosis.  No concern for pneumonia.  He feels comfortable with this and he feels comfortable going home.  He is stable for discharge.  Lab Tests: -I ordered, reviewed, and interpreted labs.   The pertinent results include:   Labs Reviewed  BASIC METABOLIC PANEL - Abnormal; Notable for the following components:      Result Value   CO2 21 (*)    All other components within normal limits  CBC      EKG  EKG Interpretation Date/Time:    Ventricular Rate:    PR Interval:    QRS Duration:    QT Interval:    QTC Calculation:   R Axis:      Text Interpretation:           Imaging Studies ordered: I ordered imaging studies including chest x-ray I independently visualized and interpreted imaging. I agree with the radiologist interpretation   Medicines ordered and prescription drug management: No orders of the defined types were placed in this encounter.   -I have reviewed the patients home medicines and have made adjustments as needed  Cardiac Monitoring: The patient was maintained on a cardiac monitor.  I personally viewed and interpreted the cardiac monitored which showed an underlying rhythm of: NSR   Reevaluation: After the interventions noted above, I reevaluated the patient and found that they have :improved  Co morbidities that complicate the patient evaluation  Past Medical History:  Diagnosis Date   Abnormal EKG 12/24/11   anteroseptal and lateral ST elevation, felt r/t early repolarization;  Cardiac cath 12/24/11 - normal coronary anatomy, EF 55-65%   Anxiety    Arthritis    Asthma    Bradycardia, sinus 12/24/11   COPD (chronic obstructive pulmonary disease) (HCC)    Dyspnea    H/O  tracheostomy    History of alcohol abuse     hospitalized for detox 2002   HTN (hypertension)    Hypotension 12/24/11   in the setting of dehydration    Marijuana use    Pneumonia    Syncope and collapse 12/24/11   2/2 hypotension in the setting of dehydration   Upper airway cough syndrome       Dispostion: Patient is stable for discharge.  Discharged in stable condition.  Return precaution discussed.  I did have respiratory evaluate patient and suctioned his trach.  He states otherwise he is feeling great.  No significant shortness of breath.  Cough is chronic.   Final Clinical Impression(s) / ED Diagnoses Final diagnoses:  None    Rx / DC Orders ED Discharge Orders     None         Marita Kansas, PA-C 11/02/23 1453    Anders Simmonds T, DO 11/03/23 0700

## 2023-11-02 NOTE — ED Provider Triage Note (Signed)
Emergency Medicine Provider Triage Evaluation Note  [name] [name] , a 73 y.o. male  was evaluated in triage.  Pt complains of shortness of breath.  Patient was seen by pulmonary yesterday.  Patient reports that he was recently treated for suspected pneumonia.  Patient reports that his last x-ray was 12 days ago.  He is concerned that his pulmonary doctors " might have missed something" and requests repeat x-ray.  Review of Systems  Positive: Shortness of breath Negative: Chest pain, fever  Physical Exam  BP 120/78 (BP Location: Right Arm)   Pulse 78   Temp 98 F (36.7 C) (Oral)   Resp 20   Ht 5\' 5"  (1.651 m)   SpO2 99%   BMI 27.89 kg/m  Gen:   Awake, no distress   Resp:  Normal effort  MSK:   Moves extremities without difficulty Other:    Medical Decision Making  Medically screening exam initiated at 11:27 AM.  Appropriate orders placed.  [name] [name] was informed that the remainder of the evaluation will be completed by another provider, this initial triage assessment does not replace that evaluation, and the importance of remaining in the ED until their evaluation is complete.  Patient understands triage process and need to remain in the ED until completion of ED evaluation.   Wynetta Fines, MD 11/02/23 1128

## 2023-11-02 NOTE — Discharge Instructions (Signed)
Your chest x-ray did not show any new or worsening pneumonia.  Similar to previous x-ray you had.  Blood work was reassuring.  Your vital signs were reassuring.  Follow-up with pulmonology.  Return for any concerning symptoms.

## 2023-11-08 DIAGNOSIS — J324 Chronic pansinusitis: Secondary | ICD-10-CM | POA: Diagnosis not present

## 2023-11-10 ENCOUNTER — Other Ambulatory Visit: Payer: Self-pay | Admitting: Internal Medicine

## 2023-11-15 IMAGING — DX DG CHEST 1V PORT
1 series · 1 of 1 positions shown · non-contrast
Comparison: 09/23/2021 and prior radiographs

CLINICAL DATA: Shortness of breath and cough for 1 week.

EXAM:
PORTABLE CHEST 1 VIEW

[chest ap]
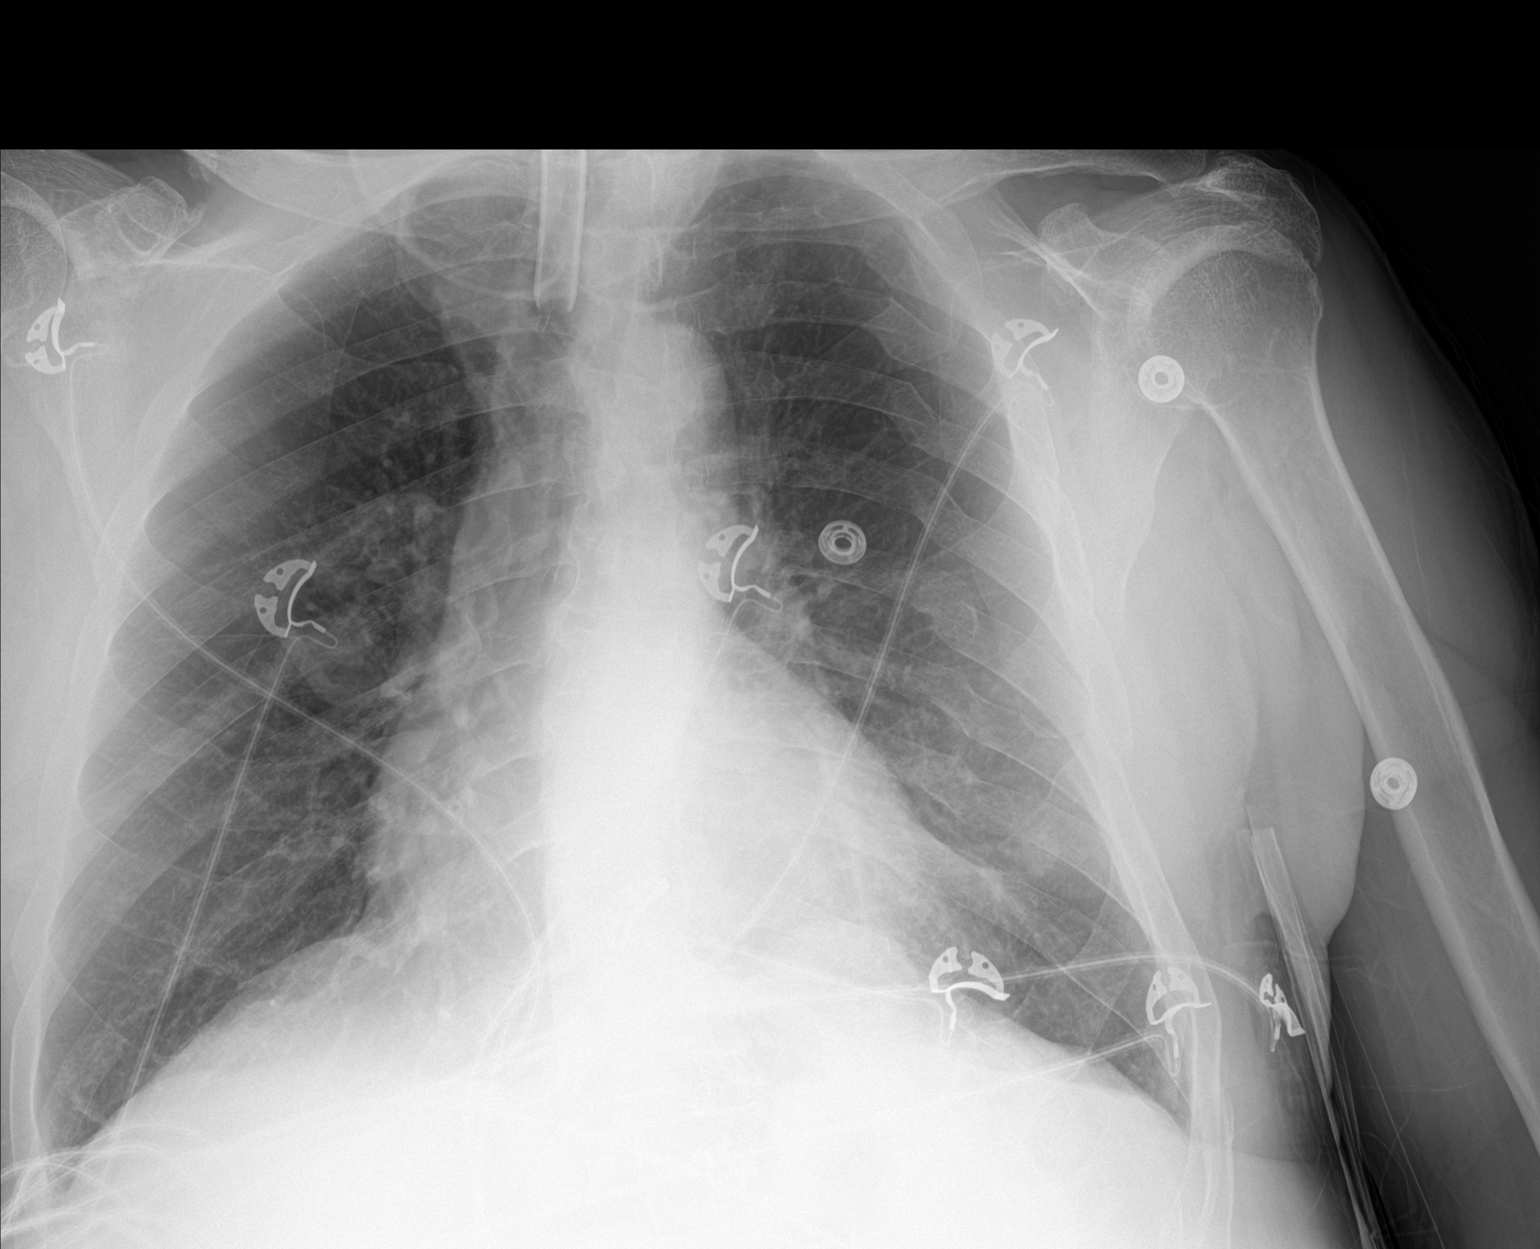

[1 of 1 positions shown; findings below may reference images not displayed]

FINDINGS: A tracheostomy tube is again noted.

The cardiomediastinal silhouette is unremarkable.

There is no evidence of focal airspace disease, pulmonary edema,
suspicious pulmonary nodule/mass, pleural effusion, or pneumothorax.

No acute bony abnormalities are identified.

Remote LEFT clavicle and rib fractures again identified.
IMPRESSION: No active disease.

## 2023-12-01 DIAGNOSIS — Z430 Encounter for attention to tracheostomy: Secondary | ICD-10-CM | POA: Diagnosis not present

## 2023-12-07 ENCOUNTER — Other Ambulatory Visit: Payer: Self-pay | Admitting: Nurse Practitioner

## 2023-12-09 ENCOUNTER — Telehealth: Payer: Self-pay

## 2023-12-09 DIAGNOSIS — Z930 Tracheostomy status: Secondary | ICD-10-CM | POA: Diagnosis not present

## 2023-12-09 DIAGNOSIS — J4489 Other specified chronic obstructive pulmonary disease: Secondary | ICD-10-CM

## 2023-12-09 DIAGNOSIS — J398 Other specified diseases of upper respiratory tract: Secondary | ICD-10-CM

## 2023-12-09 NOTE — Telephone Encounter (Signed)
 Copied from CRM [id number]. Topic: Clinical - Prescription Issue >> Dec 09, 2023  1:23 PM Konrad Dolores wrote: Reason for CRM: [facility] pharmacy technician for [facility] placed a request for med refill for medication budesonide-formoterol (SYMBICORT) 160-4.5 MCG/ACT inhaler, however it was denied. Fax number [phone] phone number [phone].   Pt last seen [name] Croft, NP on 11-01-23. Her notes stated,   " Continue Symbicort 2 puffs Twice daily until you get your breathing treatments from the [place] In the meantime, use duoneb 3 mL nebulizer treatment 2-3 times a day and keep doing your Symbicort. I switched your albuterol to this medication which is a combination of ipratropium and albuterol    If they end up covering the nebulized medications I ordered, you will stop the Symbicort and duoneb and switch to budesonide neb twice a day, formoterol neb twice a day and Yupelri neb once a day. Call us before you do this if you have any questions "  I called [facility] pharmacy and spoke to [facility] Pharmacologist) who stated that she needs the Budesonide, Formoterol, and Yupelri sent in to them for them to be able to see if insurance will cover it. If insurance does NOT cover, they will call our office to let us know and see what is advised further. If those nebs ARE covered, they will automatically cancel the Symbicort and Duoneb. Please advise.

## 2023-12-09 NOTE — Telephone Encounter (Signed)
 Did we fax the prescriptions over?

## 2023-12-10 MED ORDER — BUDESONIDE 0.5 MG/2ML IN SUSP: 0.5000 mg | Freq: Two times a day (BID) | RESPIRATORY_TRACT | 3 refills | Status: DC

## 2023-12-10 MED ORDER — FORMOTEROL FUMARATE 20 MCG/2ML IN NEBU: 20.0000 ug | INHALATION_SOLUTION | Freq: Two times a day (BID) | RESPIRATORY_TRACT | 3 refills | Status: DC

## 2023-12-10 MED ORDER — REVEFENACIN 175 MCG/3ML IN SOLN: 175.0000 ug | Freq: Every day | RESPIRATORY_TRACT | 3 refills | Status: DC

## 2023-12-10 NOTE — Telephone Encounter (Signed)
 I won't be back in [place] office until Monday. I can have them fax today from the [place] office if they don't take electronic rx.

## 2023-12-10 NOTE — Telephone Encounter (Signed)
 Orders sent

## 2023-12-23 ENCOUNTER — Other Ambulatory Visit: Payer: Self-pay | Admitting: Nurse Practitioner

## 2023-12-27 ENCOUNTER — Ambulatory Visit: Payer: Self-pay | Admitting: Nurse Practitioner

## 2023-12-27 DIAGNOSIS — J441 Chronic obstructive pulmonary disease with (acute) exacerbation: Secondary | ICD-10-CM

## 2023-12-27 NOTE — Telephone Encounter (Signed)
 E2C2 Pulmonary Triage - Initial Assessment Questions "Chief Complaint (e.g., cough, sob, wheezing, fever, chills, sweat or additional symptoms) *Go to specific symptom protocol after initial questions. Patient with hx of COPD and chronic trach calling with concerns for shortness of breath over the last two weeks. Patient states he has been using his inhalers more often that he has before. Patient reports pulse oximeter readings 96-97% today. Patient isn't sure why he feels more short of breath. Reports being able to suction out his trach without any issues-thick yellow sputum. Patient is speaking in full sentences with mild shortness of breath. Patient is needing a phone call back about his breathing treatments-unsure if he got everything he needed from the [place] pharmacy and for possible follow up visit. Patient verbalized understanding and all questions answered.   "How long have symptoms been present?" Patient states shortness of breath has been going on for 2 weeks  Have you tested for COVID or Flu? Note: If not, ask patient if a home test can be taken. If so, instruct patient to call back for positive results. No  MEDICINES:   "Have you used any OTC meds to help with symptoms?" No If yes, ask "What medications?"   "Have you used your inhalers/maintenance medication?" Yes If yes, "What medications?" Albuterol   If inhaler, ask "How many puffs and how often?" Note: Review instructions on medication in the chart. Ipratropium-Albuterol Q6 hrs  OXYGEN: "Do you wear supplemental oxygen?" No If yes, "How many liters are you supposed to use?"   "Do you monitor your oxygen levels?" Yes If yes, "What is your reading (oxygen level) today?" 96-97%  "What is your usual oxygen saturation reading?"  (Note: Pulmonary O2 sats should be 90% or greater) 95%   Copied from CRM #[id number]. Topic: Clinical - Red Word Triage >> Dec 27, 2023  9:31 AM [name] [name] wrote: Red Word that prompted transfer to Nurse  Triage: Shortness of breath and difficulty of breathing Reason for Disposition  [1] MILD difficulty breathing (e.g., minimal/no SOB at rest, SOB with walking, pulse <100) AND [2] NEW-onset or WORSE than normal  Answer Assessment - Initial Assessment Questions 1. RESPIRATORY STATUS: "Describe your breathing?" (e.g., wheezing, shortness of breath, unable to speak, severe coughing)      Shortness of breath, slightly wheezing per patient report 2. ONSET: "When did this breathing problem begin?"      Shortness of breath increased over the last two weeks 3. PATTERN "Does the difficult breathing come and go, or has it been constant since it started?"      Comes and goes 4. SEVERITY: "How bad is your breathing?" (e.g., mild, moderate, severe)    - MILD: No SOB at rest, mild SOB with walking, speaks normally in sentences, can lie down, no retractions, pulse < 100.    - MODERATE: SOB at rest, SOB with minimal exertion and prefers to sit, cannot lie down flat, speaks in phrases, mild retractions, audible wheezing, pulse 100-120.    - SEVERE: Very SOB at rest, speaks in single words, struggling to breathe, sitting hunched forward, retractions, pulse > 120      Mild 5. RECURRENT SYMPTOM: "Have you had difficulty breathing before?" If Yes, ask: "When was the last time?" and "What happened that time?"       6. CARDIAC HISTORY: "Do you have any history of heart disease?" (e.g., heart attack, angina, bypass surgery, angioplasty)      No 7. LUNG HISTORY: "Do you have any history of  lung disease?"  (e.g., pulmonary embolus, asthma, emphysema)     COPD, chronic trach 8. CAUSE: "What do you think is causing the breathing problem?"      unsure 9. OTHER SYMPTOMS: "Do you have any other symptoms? (e.g., dizziness, runny nose, cough, chest pain, fever)     Thick sputum from trach 10. O2 SATURATION MONITOR:  "Do you use an oxygen saturation monitor (pulse oximeter) at home?" If Yes, ask: "What is your reading  (oxygen level) today?" "What is your usual oxygen saturation reading?" (e.g., 95%)       96% 12. TRAVEL: "Have you traveled out of the country in the last month?" (e.g., travel history, exposures)       No  Protocols used: Breathing Difficulty-A-AH

## 2023-12-29 MED ORDER — PREDNISONE 20 MG PO TABS
40.0000 mg | ORAL_TABLET | Freq: Every day | ORAL | 0 refills | Status: DC
Start: 2023-12-29 — End: 2024-01-03

## 2023-12-29 NOTE — Addendum Note (Signed)
 Addended by: Sammuel Blick V on: 12/29/2023 04:44 PM   Modules accepted: Orders

## 2023-12-29 NOTE — Telephone Encounter (Signed)
 Pt Is taking Symbicort and an Albuterol Neb. Pt is NOT taking  Pulmicort neb, Albuterol inhaler. [facility] and [facility]ol Inhal[facility]. Pt the stated he is only using Albuterol for a neb.   Pt is very confused on medications. Pt went back and forth saying yes and no to taking Symbicort. Pt is unsure of what he is taking and what he truly received from the [place]. I tried going over the instructions from the last ov, which made pt more confused.

## 2023-12-29 NOTE — Telephone Encounter (Signed)
"  Continue Symbicort 2 puffs Twice daily until you get your breathing treatments from the [place] In the meantime, use duoneb 3 mL nebulizer treatment 3-4 times a day and keep doing your Symbicort. I switched your albuterol to this medication which is a combination of ipratropium and albuterol   If they end up covering the nebulized medications I ordered, you will stop the Symbicort and duoneb and switch to budesonide neb twice a day, formoterol neb twice a day and Yupelri neb once a day. Call us  before you do this if you have any questions "  [facility]  I sent prednisone 40 mg daily for 5 days for increased dyspnea. Thanks. UC/ED if symptoms fail to improve or worsen

## 2023-12-30 ENCOUNTER — Other Ambulatory Visit: Payer: Self-pay | Admitting: Nurse Practitioner

## 2023-12-30 DIAGNOSIS — J441 Chronic obstructive pulmonary disease with (acute) exacerbation: Secondary | ICD-10-CM

## 2023-12-30 MED ORDER — PREDNISONE 20 MG PO TABS: 40.0000 mg | ORAL_TABLET | Freq: Every day | ORAL | 0 refills | Status: AC

## 2023-12-30 NOTE — Telephone Encounter (Signed)
 Spoke with the pt and notified of Katie's response. He verbalized understanding. I have sent pred to preferred pharm.

## 2024-01-03 ENCOUNTER — Telehealth: Payer: Self-pay

## 2024-01-03 NOTE — Telephone Encounter (Signed)
 Copied from CRM [id number]. Topic: General - Other >> Dec 31, 2023  9:42 AM Juliana Ocean wrote: Reason for CRM: pt wants to thank everyone for being so kind and taking such great care of him when he was feeling under the weather. He says he feels like a new person.  NFN

## 2024-01-06 ENCOUNTER — Encounter (HOSPITAL_COMMUNITY): Payer: Self-pay

## 2024-01-06 ENCOUNTER — Emergency Department (HOSPITAL_COMMUNITY)

## 2024-01-06 ENCOUNTER — Other Ambulatory Visit: Payer: Self-pay

## 2024-01-06 ENCOUNTER — Inpatient Hospital Stay (HOSPITAL_COMMUNITY)
Admission: EM | Admit: 2024-01-06 | Discharge: 2024-01-07 | DRG: 208 | Disposition: A | Discharge status: Home / Self Care | Attending: Family Medicine | Admitting: Family Medicine

## 2024-01-06 DIAGNOSIS — R6889 Other general symptoms and signs: Secondary | ICD-10-CM | POA: Diagnosis not present

## 2024-01-06 DIAGNOSIS — D72829 Elevated white blood cell count, unspecified: Secondary | ICD-10-CM | POA: Diagnosis present

## 2024-01-06 DIAGNOSIS — Z79899 Other long term (current) drug therapy: Secondary | ICD-10-CM

## 2024-01-06 DIAGNOSIS — Z7982 Long term (current) use of aspirin: Secondary | ICD-10-CM | POA: Diagnosis not present

## 2024-01-06 DIAGNOSIS — Z930 Tracheostomy status: Secondary | ICD-10-CM | POA: Diagnosis not present

## 2024-01-06 DIAGNOSIS — Z825 Family history of asthma and other chronic lower respiratory diseases: Secondary | ICD-10-CM

## 2024-01-06 DIAGNOSIS — Z7951 Long term (current) use of inhaled steroids: Secondary | ICD-10-CM | POA: Diagnosis not present

## 2024-01-06 DIAGNOSIS — J441 Chronic obstructive pulmonary disease with (acute) exacerbation: Secondary | ICD-10-CM | POA: Diagnosis not present

## 2024-01-06 DIAGNOSIS — Z1152 Encounter for screening for COVID-19: Secondary | ICD-10-CM | POA: Diagnosis not present

## 2024-01-06 DIAGNOSIS — E876 Hypokalemia: Secondary | ICD-10-CM | POA: Diagnosis present

## 2024-01-06 DIAGNOSIS — I493 Ventricular premature depolarization: Secondary | ICD-10-CM | POA: Diagnosis present

## 2024-01-06 DIAGNOSIS — R062 Wheezing: Secondary | ICD-10-CM | POA: Diagnosis not present

## 2024-01-06 DIAGNOSIS — I10 Essential (primary) hypertension: Secondary | ICD-10-CM | POA: Diagnosis present

## 2024-01-06 DIAGNOSIS — R231 Pallor: Secondary | ICD-10-CM | POA: Diagnosis not present

## 2024-01-06 DIAGNOSIS — R0689 Other abnormalities of breathing: Secondary | ICD-10-CM | POA: Diagnosis not present

## 2024-01-06 LAB — URINALYSIS, W/ REFLEX TO CULTURE (INFECTION SUSPECTED)
Bacteria, UA: NONE SEEN
Bilirubin Urine: NEGATIVE
Glucose, UA: NEGATIVE mg/dL
Hgb urine dipstick: NEGATIVE
Ketones, ur: NEGATIVE mg/dL
Leukocytes,Ua: NEGATIVE
Nitrite: NEGATIVE
Protein, ur: NEGATIVE mg/dL
Specific Gravity, Urine: 1.011 (ref 1.005–1.030)
pH: 5 (ref 5.0–8.0)

## 2024-01-06 LAB — I-STAT CG4 LACTIC ACID, ED: Lactic Acid, Venous: 1.3 mmol/L (ref 0.5–1.9)

## 2024-01-06 LAB — CBC
HCT: 40.2 % (ref 39.0–52.0)
HCT: 41.6 % (ref 39.0–52.0)
Hemoglobin: 14 g/dL (ref 13.0–17.0)
Hemoglobin: 14.1 g/dL (ref 13.0–17.0)
MCH: 31.7 pg (ref 26.0–34.0)
MCH: 31.3 pg (ref 26.0–34.0)
MCHC: 34.8 g/dL (ref 30.0–36.0)
MCHC: 33.9 g/dL (ref 30.0–36.0)
MCV: 91 fL (ref 80.0–100.0)
MCV: 92.2 fL (ref 80.0–100.0)
Platelets: 299 10*3/uL (ref 150–400)
Platelets: 275 10*3/uL (ref 150–400)
RBC: 4.42 MIL/uL (ref 4.22–5.81)
RBC: 4.51 MIL/uL (ref 4.22–5.81)
RDW: 13.2 % (ref 11.5–15.5)
RDW: 13.2 % (ref 11.5–15.5)
WBC: 16.6 10*3/uL — ABNORMAL HIGH (ref 4.0–10.5)
WBC: 14 10*3/uL — ABNORMAL HIGH (ref 4.0–10.5)
nRBC: 0 % (ref 0.0–0.2)
nRBC: 0 % (ref 0.0–0.2)

## 2024-01-06 LAB — RESPIRATORY PANEL BY PCR

## 2024-01-06 LAB — CREATININE, SERUM
Creatinine, Ser: 0.69 mg/dL (ref 0.61–1.24)
GFR, Estimated: >60 mL/min (ref >60)

## 2024-01-06 LAB — BRAIN NATRIURETIC PEPTIDE: B Natriuretic Peptide: 9.3 pg/mL (ref 0.0–100.0)

## 2024-01-06 LAB — I-STAT VENOUS BLOOD GAS, ED
Acid-Base Excess: 3 mmol/L — ABNORMAL HIGH (ref 0.0–2.0)
Bicarbonate: 26.9 mmol/L (ref 20.0–28.0)
Calcium, Ion: 1.05 mmol/L — ABNORMAL LOW (ref 1.15–1.40)
HCT: 43 % (ref 39.0–52.0)
Hemoglobin: 14.6 g/dL (ref 13.0–17.0)
O2 Saturation: 98 %
Potassium: 3.5 mmol/L (ref 3.5–5.1)
Sodium: 134 mmol/L — ABNORMAL LOW (ref 135–145)
TCO2: 28 mmol/L (ref 22–32)
pCO2, Ven: 37.6 mmHg — ABNORMAL LOW (ref 44–60)
pH, Ven: 7.463 — ABNORMAL HIGH (ref 7.25–7.43)
pO2, Ven: 100 mmHg — ABNORMAL HIGH (ref 32–45)

## 2024-01-06 LAB — I-STAT CHEM 8, ED
BUN: 12 mg/dL (ref 8–23)
Calcium, Ion: 0.99 mmol/L — ABNORMAL LOW (ref 1.15–1.40)
Chloride: 100 mmol/L (ref 98–111)
Creatinine, Ser: 0.8 mg/dL (ref 0.61–1.24)
Glucose, Bld: 127 mg/dL — ABNORMAL HIGH (ref 70–99)
HCT: 42 % (ref 39.0–52.0)
Hemoglobin: 14.3 g/dL (ref 13.0–17.0)
Potassium: 3.5 mmol/L (ref 3.5–5.1)
Sodium: 133 mmol/L — ABNORMAL LOW (ref 135–145)
TCO2: 26 mmol/L (ref 22–32)

## 2024-01-06 LAB — COMPREHENSIVE METABOLIC PANEL WITH GFR
ALT: 35 U/L (ref 0–44)
AST: 30 U/L (ref 15–41)
Albumin: 3.6 g/dL (ref 3.5–5.0)
Alkaline Phosphatase: 57 U/L (ref 38–126)
Anion gap: 14 (ref 5–15)
BUN: 11 mg/dL (ref 8–23)
CO2: 21 mmol/L — ABNORMAL LOW (ref 22–32)
Calcium: 8.4 mg/dL — ABNORMAL LOW (ref 8.9–10.3)
Chloride: 98 mmol/L (ref 98–111)
Creatinine, Ser: 0.7 mg/dL (ref 0.61–1.24)
GFR, Estimated: >60 mL/min (ref >60)
Glucose, Bld: 152 mg/dL — ABNORMAL HIGH (ref 70–99)
Potassium: 3.3 mmol/L — ABNORMAL LOW (ref 3.5–5.1)
Sodium: 133 mmol/L — ABNORMAL LOW (ref 135–145)
Total Bilirubin: 1 mg/dL (ref 0.0–1.2)
Total Protein: 6.8 g/dL (ref 6.5–8.1)

## 2024-01-06 LAB — RESP PANEL BY RT-PCR (RSV, FLU A&B, COVID)  RVPGX2
Influenza A by PCR: NEGATIVE
Influenza B by PCR: NEGATIVE
Resp Syncytial Virus by PCR: NEGATIVE
SARS Coronavirus 2 by RT PCR: NEGATIVE

## 2024-01-06 MED ORDER — PREDNISONE 20 MG PO TABS: 40.0000 mg | ORAL_TABLET | Freq: Every day | ORAL | Status: DC

## 2024-01-06 MED ORDER — PREDNISONE 20 MG PO TABS
40.0000 mg | ORAL_TABLET | Freq: Every day | ORAL | Status: DC
  Administered 2024-01-07: 40 mg via ORAL
  Filled 2024-01-06: qty 2

## 2024-01-06 MED ORDER — MORPHINE SULFATE (PF) 4 MG/ML IV SOLN
4.0000 mg | Freq: Once | INTRAVENOUS | Status: AC
  Administered 2024-01-06: 4 mg via INTRAVENOUS
  Filled 2024-01-06: qty 1

## 2024-01-06 MED ORDER — GUAIFENESIN ER 600 MG PO TB12
600.0000 mg | ORAL_TABLET | Freq: Two times a day (BID) | ORAL | Status: DC
  Administered 2024-01-06 – 2024-01-07 (×2): 600 mg via ORAL
  Filled 2024-01-06 (×2): qty 1

## 2024-01-06 MED ORDER — TERAZOSIN HCL 5 MG PO CAPS
5.0000 mg | ORAL_CAPSULE | Freq: Every day | ORAL | Status: DC
  Administered 2024-01-06: 5 mg via ORAL
  Filled 2024-01-06 (×2): qty 1

## 2024-01-06 MED ORDER — ASPIRIN 81 MG PO TBEC
81.0000 mg | DELAYED_RELEASE_TABLET | Freq: Every day | ORAL | Status: DC
  Administered 2024-01-06 – 2024-01-07 (×2): 81 mg via ORAL
  Filled 2024-01-06 (×2): qty 1

## 2024-01-06 MED ORDER — FLUTICASONE PROPIONATE 50 MCG/ACT NA SUSP
2.0000 | Freq: Every day | NASAL | Status: DC
  Administered 2024-01-06 – 2024-01-07 (×2): 2 via NASAL
  Filled 2024-01-06: qty 16

## 2024-01-06 MED ORDER — ALBUTEROL SULFATE (2.5 MG/3ML) 0.083% IN NEBU
10.0000 mg/h | INHALATION_SOLUTION | Freq: Once | RESPIRATORY_TRACT | Status: AC
  Administered 2024-01-06: 10 mg/h via RESPIRATORY_TRACT
  Filled 2024-01-06: qty 3

## 2024-01-06 MED ORDER — SODIUM CHLORIDE 0.9 % IV SOLN
500.0000 mg | Freq: Once | INTRAVENOUS | Status: AC
  Administered 2024-01-06: 500 mg via INTRAVENOUS
  Filled 2024-01-06: qty 5

## 2024-01-06 MED ORDER — FUROSEMIDE 10 MG/ML IJ SOLN
20.0000 mg | Freq: Once | INTRAMUSCULAR | Status: AC
  Administered 2024-01-06: 20 mg via INTRAVENOUS
  Filled 2024-01-06: qty 2

## 2024-01-06 MED ORDER — LOSARTAN POTASSIUM 50 MG PO TABS
25.0000 mg | ORAL_TABLET | Freq: Every day | ORAL | Status: DC
  Administered 2024-01-06: 25 mg via ORAL
  Filled 2024-01-06: qty 1

## 2024-01-06 MED ORDER — SODIUM CHLORIDE 0.9 % IV SOLN
1.0000 g | Freq: Once | INTRAVENOUS | Status: AC
  Administered 2024-01-06: 1 g via INTRAVENOUS
  Filled 2024-01-06: qty 10

## 2024-01-06 MED ORDER — MONTELUKAST SODIUM 10 MG PO TABS
10.0000 mg | ORAL_TABLET | Freq: Every day | ORAL | Status: DC
  Administered 2024-01-06: 10 mg via ORAL
  Filled 2024-01-06: qty 1

## 2024-01-06 MED ORDER — ROSUVASTATIN CALCIUM 5 MG PO TABS
5.0000 mg | ORAL_TABLET | Freq: Every day | ORAL | Status: DC
  Administered 2024-01-06: 5 mg via ORAL
  Filled 2024-01-06: qty 1

## 2024-01-06 MED ORDER — IPRATROPIUM-ALBUTEROL 0.5-2.5 (3) MG/3ML IN SOLN: 3.0000 mL | Freq: Four times a day (QID) | RESPIRATORY_TRACT | Status: DC | PRN

## 2024-01-06 MED ORDER — ONDANSETRON HCL 4 MG/2ML IJ SOLN
4.0000 mg | Freq: Once | INTRAMUSCULAR | Status: AC
  Administered 2024-01-06: 4 mg via INTRAVENOUS
  Filled 2024-01-06: qty 2

## 2024-01-06 MED ORDER — FINASTERIDE 5 MG PO TABS
5.0000 mg | ORAL_TABLET | Freq: Every day | ORAL | Status: DC
  Administered 2024-01-06 – 2024-01-07 (×2): 5 mg via ORAL
  Filled 2024-01-06 (×2): qty 1

## 2024-01-06 MED ORDER — IPRATROPIUM-ALBUTEROL 0.5-2.5 (3) MG/3ML IN SOLN
3.0000 mL | Freq: Four times a day (QID) | RESPIRATORY_TRACT | Status: DC
  Administered 2024-01-06 – 2024-01-07 (×5): 3 mL via RESPIRATORY_TRACT
  Filled 2024-01-06 (×3): qty 3

## 2024-01-06 MED ORDER — FLUTICASONE FUROATE-VILANTEROL 200-25 MCG/ACT IN AEPB
1.0000 | INHALATION_SPRAY | Freq: Every day | RESPIRATORY_TRACT | Status: DC
  Filled 2024-01-06: qty 28

## 2024-01-06 MED ORDER — HYDROCHLOROTHIAZIDE 12.5 MG PO TABS
12.5000 mg | ORAL_TABLET | Freq: Every day | ORAL | Status: DC
  Administered 2024-01-06 – 2024-01-07 (×2): 12.5 mg via ORAL
  Filled 2024-01-06 (×2): qty 1

## 2024-01-06 MED ORDER — PANTOPRAZOLE SODIUM 40 MG PO TBEC
40.0000 mg | DELAYED_RELEASE_TABLET | Freq: Every day | ORAL | Status: DC
  Administered 2024-01-06 – 2024-01-07 (×2): 40 mg via ORAL
  Filled 2024-01-06 (×2): qty 1

## 2024-01-06 MED ORDER — LORATADINE 10 MG PO TABS
10.0000 mg | ORAL_TABLET | Freq: Every day | ORAL | Status: DC
  Administered 2024-01-06 – 2024-01-07 (×2): 10 mg via ORAL
  Filled 2024-01-06 (×2): qty 1

## 2024-01-06 MED ORDER — IPRATROPIUM BROMIDE 0.02 % IN SOLN
0.5000 mg | Freq: Once | RESPIRATORY_TRACT | Status: AC
  Administered 2024-01-06: 0.5 mg via RESPIRATORY_TRACT
  Filled 2024-01-06: qty 2.5

## 2024-01-06 MED ORDER — ENOXAPARIN SODIUM 40 MG/0.4ML IJ SOSY
40.0000 mg | PREFILLED_SYRINGE | INTRAMUSCULAR | Status: DC
  Administered 2024-01-06: 40 mg via SUBCUTANEOUS
  Filled 2024-01-06: qty 0.4

## 2024-01-06 MED ORDER — HYDROXYZINE HCL 25 MG PO TABS
25.0000 mg | ORAL_TABLET | Freq: Two times a day (BID) | ORAL | Status: DC | PRN
Start: 2024-01-06 — End: 2024-01-07

## 2024-01-06 NOTE — Procedures (Signed)
 Tracheostomy Change Note  Patient Details:   Name: [name] [name] DOB: [birth year] MRN: [id number]    Airway Documentation:     Evaluation  O2 sats: stable throughout Complications: No apparent complications Patient did tolerate procedure well. Bilateral Breath Sounds: Expiratory wheezes   Patient's trach changed from #6 Shiley cuffless trach to #6 Shiley cuffed trach in order to place patient on ventilator, with second RT present.  Positive color change was noted.  Bilateral breath sounds auscultated.  Sats, and vitals, are stable at this time.  Tolerating CPAP/PSV 10/5 on ventilator well at this time.  Will continue to monitor.   Levell Reach 01/06/2024, 7:48 AM

## 2024-01-06 NOTE — Progress Notes (Signed)
 Patient was transferred from the ED via bed at 1500 pm . Alert and oriented. On RA. Will continue to monitor

## 2024-01-06 NOTE — ED Notes (Signed)
 Lunch tray at bedside. ?

## 2024-01-06 NOTE — ED Notes (Addendum)
 The IV in left forearm that I was running rocephin  through infiltrated and I think most of it went into his forearm.  Presumably he will still get it as it absorbs.  I called pharmacy and they said just to put ice on it and elevate his arm a bit and watch it to make sure it doesn't start looking "angry".  He is not complaining of any discomfort. EDP notified. IV removed.

## 2024-01-06 NOTE — Care Management (Signed)
 Transition of Care [facility]) - Inpatient Brief Assessment   Patient Details  Name: [name] [name] MRN: [id number] Date of Birth: 06-09-1951  [facility] of [facility] CM/SW Contact:    Ronni Colace, RN Phone Number: 01/06/2024, 3:46 PM   Clinical Narrative:  Presented with [facility], has been short of breath for  over a month, has tracheostomy. Went to see pulmonary and they prescribed 10 days of steroid, started feeling better then worse after meds ran out. Was briefly on vent in ED, trach changed and weaned down. Patient has PCP at [facility], he has a appt on 4/30 at 1100 on [facility]. [facility], the patient needs to call also they stated.  TOC will follow the patient  Transition of Care Asessment: Insurance and Status: Insurance coverage has been reviewed Patient has primary care physician: Yes   Prior level of function:: Independent Prior/Current Home Services: No current home services Social Drivers of Health Review: SDOH reviewed no interventions necessary Readmission risk has been reviewed: Yes Transition of care needs: transition of care needs identified, TOC will continue to follow

## 2024-01-06 NOTE — ED Provider Notes (Signed)
 [place] EMERGENCY DEPARTMENT AT [facility] Provider Note   CSN: [id number] Arrival date & time: 01/06/24  4098     History  Chief Complaint  Patient presents with   Respiratory Distress   HPI Terius Jacuinde is a 73 y.o. male who is trach dependent, COPD, hypertension, presenting for respiratory distress.  Brought in by EMS.  Has been short of breath since 1:00.  Patient having difficulty speaking due to his respiratory distress.  EMS gave him 10 of albuterol  0.5 Atrovent  125 Solu-Medrol  along with 2 g of magnesium  prior to arrival.  He was able to communicate that his chest hurts all over with coughing.  And he is unsure that he has had a fever at home.  After stabilizing the patient, he was able to tell me that he has been short of breath for couple months and that in the last 2 weeks she has had more of a cough and the mucus burden from his trach has been significantly increased.  Unsure if he had a fever.  Also states that he finished up a course of prednisone  last week for his COPD.  Now he denies chest pain.  Lives at home.  HPI     Home Medications Prior to Admission medications   Medication Sig Start Date End Date Taking? Authorizing Provider  acetaminophen  325 MG tablet Take 2 tablets (650 mg total) by mouth every 6 (six) hours as needed. 12/28/19   Maczis, Michael M, PA-C  aspirin  EC 81 MG tablet Take 81 mg by mouth daily. 03/29/23   [provider]  budesonide  (PULMICORT ) 0.5 MG/2ML nebulizer solution Take 2 mLs (0.5 mg total) by nebulization in the morning and at bedtime. 12/10/23   Cobb, Mariah Shines, NP  budesonide -formoterol  (SYMBICORT ) 160-4.5 MCG/ACT inhaler Inhale 2 puffs into the lungs 2 (two) times daily. 09/22/23   [provider]  cetirizine (ZYRTEC) 10 MG tablet Take 10 mg by mouth daily.    [provider]  cyclobenzaprine (FLEXERIL) 10 MG tablet Take 10 mg by mouth daily. As Directed    [provider]  finasteride   (PROSCAR ) 5 MG tablet Take 5 mg by mouth daily.    [provider]  fluticasone  (FLONASE ) 50 MCG/ACT nasal spray Instill 2 sprays in each nostril once daily 12/07/23   Cobb, Mariah Shines, NP  formoterol  (PERFOROMIST ) 20 MCG/2ML nebulizer solution Take 2 mLs (20 mcg total) by nebulization 2 (two) times daily. 12/10/23   Cobb, Mariah Shines, NP  guaiFENesin  (MUCINEX ) 600 MG 12 hr tablet Take 600 mg by mouth 2 (two) times daily.    [provider]  hydrochlorothiazide  (HYDRODIURIL ) 25 MG tablet Take 1 tablet (25 mg total) by mouth daily. Patient taking differently: Take 12.5 mg by mouth daily. 05/30/18   Kraig Peru, MD  hydrOXYzine  (ATARAX ) 25 MG tablet Take 25 mg by mouth 2 (two) times daily as needed for anxiety.    [provider]  ipratropium-albuterol  (DUONEB) 0.5-2.5 (3) MG/3ML SOLN Take 3 mLs by nebulization every 6 (six) hours as needed. 11/01/23   Cobb, Mariah Shines, NP  losartan  (COZAAR ) 25 MG tablet Take 25 mg by mouth at bedtime. 01/17/21   [provider]  montelukast  (SINGULAIR ) 10 MG tablet TAKE 1 TABLET(10 MG) BY MOUTH DAILY 12/23/23   Cobb, Katherine V, NP  Multiple Vitamin (MULTIVITAMIN) tablet Take 1 tablet by mouth daily.    [provider]  Multiple Vitamins-Minerals (ONE-A-DAY MENS 50+) TABS as directed Orally  [provider]  pantoprazole  (PROTONIX ) 40 MG tablet TAKE ONE TABLET BY MOUTH BEFORE BREAKFAST (30 MINUTES BEFORE) 03/11/22   Diamond Formica, MD  revefenacin  (YUPELRI ) 175 MCG/3ML nebulizer solution Take 3 mLs (175 mcg total) by nebulization daily. 12/10/23   Cobb, Mariah Shines, NP  rosuvastatin  (CRESTOR ) 5 MG tablet Take 5 mg by mouth daily. 10/05/23   [provider]  sodium chloride  0.9 % nebulizer solution Take 3-4 mLs by nebulization daily as needed (Use 3-4 drops into Trach to loosen phlegm suction secretions immediatly after.). 08/17/19   [provider]  tadalafil (CIALIS) 20 MG tablet Take 20 mg by  mouth once as needed for erectile dysfunction. Take 1 hour prior to sexual activity *Do not exceed 1 dose in 24 hours*    [provider]  terazosin  (HYTRIN ) 5 MG capsule Take 5 mg by mouth at bedtime.    [provider]  triamcinolone  cream (KENALOG ) 0.1 % Apply 1 Application topically 2 (two) times daily.    [provider]      Allergies    Patient has no known allergies.    Review of Systems   See HPI   Physical Exam   Vitals:   01/06/24 0845 01/06/24 0915  BP: 118/65 120/63  Pulse: 96 87  Resp: (!) 22 20  Temp:    SpO2: 94% 91%    CONSTITUTIONAL:  well-appearing, in respiratory distress NEURO:  Alert and oriented x 3, CN 3-12 grossly intact EYES:  eyes equal and reactive ENT/NECK:  Supple, no stridor, trach noted CARDIO:  Tachycardic and regular rhythm, appears well-perfused  PULM:  In respiratory distress, tachypneic, subcostal retractions noted, diffuse wheezing and coarse breath sounds heard throughout GI/GU:  non-distended MSK/SPINE:  No gross deformities, no edema, moves all extremities  SKIN:  no rash, atraumatic   *Additional and/or pertinent findings included in MDM below    ED Results / Procedures / Treatments   Labs (all labs ordered are listed, but only abnormal results are displayed) Labs Reviewed  COMPREHENSIVE METABOLIC PANEL WITH GFR - Abnormal; Notable for the following components:      Result Value   Sodium 133 (*)    Potassium 3.3 (*)    CO2 21 (*)    Glucose, Bld 152 (*)    Calcium  8.4 (*)    All other components within normal limits  CBC - Abnormal; Notable for the following components:   WBC 16.6 (*)    All other components within normal limits  I-STAT CHEM 8, ED - Abnormal; Notable for the following components:   Sodium 133 (*)    Glucose, Bld 127 (*)    Calcium , Ion 0.99 (*)    All other components within normal limits  I-STAT VENOUS BLOOD GAS, ED - Abnormal; Notable for the following components:   pH,  Ven 7.463 (*)    pCO2, Ven 37.6 (*)    pO2, Ven 100 (*)    Acid-Base Excess 3.0 (*)    Sodium 134 (*)    Calcium , Ion 1.05 (*)    All other components within normal limits  RESP PANEL BY RT-PCR (RSV, FLU A&B, COVID)  RVPGX2  CULTURE, BLOOD (ROUTINE X 2)  CULTURE, BLOOD (ROUTINE X 2)  CULTURE, BLOOD (ROUTINE X 2)  CULTURE, BLOOD (ROUTINE X 2)  EXPECTORATED SPUTUM ASSESSMENT W GRAM STAIN, RFLX TO RESP C  RESPIRATORY PANEL BY PCR  URINALYSIS, W/ REFLEX TO CULTURE (INFECTION SUSPECTED)  CBC  CREATININE, SERUM  BRAIN  NATRIURETIC PEPTIDE  I-STAT CG4 LACTIC ACID, ED  I-STAT CG4 LACTIC ACID, ED    EKG None  Radiology DG Chest Port 1 View Result Date: 01/06/2024 CLINICAL DATA:  Shortness of breath EXAM: PORTABLE CHEST 1 VIEW COMPARISON:  11/02/2023 FINDINGS: Tracheostomy remains in place, unchanged. Heart and mediastinal contours are within normal limits. No focal opacities or effusions. No acute bony abnormality. Multiple old healed left rib fractures. IMPRESSION: No active disease. Electronically Signed   By: Janeece Mechanic M.D.   On: 01/06/2024 08:02    Procedures .Critical Care  Performed by: Janalee Mcmurray, PA-C Authorized by: Janalee Mcmurray, PA-C   Critical care provider statement:    Critical care time (minutes):  30   Critical care was necessary to treat or prevent imminent or life-threatening deterioration of the following conditions:  Sepsis and respiratory failure   Critical care was time spent personally by me on the following activities:  Development of treatment plan with patient or surrogate, discussions with consultants, evaluation of patient's response to treatment, examination of patient, ordering and review of laboratory studies, ordering and review of radiographic studies, ordering and performing treatments and interventions, pulse oximetry, re-evaluation of patient's condition and review of old charts     Medications Ordered in ED Medications   ipratropium-albuterol  (DUONEB) 0.5-2.5 (3) MG/3ML nebulizer solution 3 mL (has no administration in time range)  enoxaparin  (LOVENOX ) injection 40 mg (has no administration in time range)  aspirin  EC tablet 81 mg (has no administration in time range)  fluticasone  furoate-vilanterol (BREO ELLIPTA ) 200-25 MCG/ACT 1 puff (has no administration in time range)  fluticasone  (FLONASE ) 50 MCG/ACT nasal spray 2 spray (has no administration in time range)  finasteride  (PROSCAR ) tablet 5 mg (has no administration in time range)  loratadine  (CLARITIN ) tablet 10 mg (has no administration in time range)  guaiFENesin  (MUCINEX ) 12 hr tablet 600 mg (has no administration in time range)  hydrochlorothiazide  (HYDRODIURIL ) tablet 12.5 mg (has no administration in time range)  hydrOXYzine  (ATARAX ) tablet 25 mg (has no administration in time range)  ipratropium-albuterol  (DUONEB) 0.5-2.5 (3) MG/3ML nebulizer solution 3 mL (has no administration in time range)  losartan  (COZAAR ) tablet 25 mg (has no administration in time range)  montelukast  (SINGULAIR ) tablet 10 mg (has no administration in time range)  pantoprazole  (PROTONIX ) EC tablet 40 mg (has no administration in time range)  rosuvastatin  (CRESTOR ) tablet 5 mg (has no administration in time range)  terazosin  (HYTRIN ) capsule 5 mg (has no administration in time range)  albuterol  (PROVENTIL ) (2.5 MG/3ML) 0.083% nebulizer solution (10 mg/hr Nebulization Given 01/06/24 0712)  ipratropium (ATROVENT ) nebulizer solution 0.5 mg (0.5 mg Nebulization Given 01/06/24 0712)  morphine  (PF) 4 MG/ML injection 4 mg (4 mg Intravenous Given 01/06/24 0826)  ondansetron  (ZOFRAN ) injection 4 mg (4 mg Intravenous Given 01/06/24 0826)  cefTRIAXone  (ROCEPHIN ) 1 g in sodium chloride  0.9 % 100 mL IVPB (0 g Intravenous Stopped 01/06/24 0954)  azithromycin  (ZITHROMAX ) 500 mg in sodium chloride  0.9 % 250 mL IVPB (0 mg Intravenous Stopped 01/06/24 1024)    ED Course/ Medical Decision Making/  A&P                                 Medical Decision Making Risk Prescription drug management. Decision regarding hospitalization.   Initial Impression and Ddx 73 year old male in respiratory distress, well-appearing presenting for shortness of breath.  Exam notable for tachypnea, diffuse coarse wheezing breath sounds  but no crackles.  Started him on continuous albuterol , ipratropium and symptoms started to improve.  Transitioned him to the vent as well.  DDx includes COPD exacerbation, pneumonia, sepsis, ACS, PE, CHF exacerbation, other. Patient PMH that increases complexity of ED encounter:  trach dependent, COPD, hypertension  Interpretation of Diagnostics - I independent reviewed and interpreted the labs as followed: Leukocytosis (16.6)  - I independently visualized the following imaging with scope of interpretation limited to determining acute life threatening conditions related to emergency care: cxr, which revealed no acute findings  - I personally reviewed and interpreted EKG which revealed sinus rhythm with PVC, non ischemic  Patient Reassessment and Ultimate Disposition/Management Persistently tachypneic but appeared more comfortable overall from respiratory standpoint.  Breath sounds improved as well.  Due to his leukocytosis, tachypnea and respiratory distress, raised concern for sepsis as well.  Treated with IV ceftriaxone  and azithromycin .  The leukocytosis could be steroid-induced as he just finished up a course of prednisone  about a week ago.  Changed out his trach tube and trialed him off the vent and he remains stable, resting in bed. Admitted to hospital service for COPD exacerbation with Dr. Arne Langdon.   Patient management required discussion with the following services or consulting groups:  Hospitalist Service  Complexity of Problems Addressed Acute complicated illness or Injury  Additional Data Reviewed and Analyzed Further history obtained from: Past medical  history and medications listed in the EMR and Prior ED visit notes  Patient Encounter Risk Assessment Consideration of hospitalization         Final Clinical Impression(s) / ED Diagnoses Final diagnoses:  COPD exacerbation [facility])    Rx / DC Orders ED Discharge Orders     None         Janalee Mcmurray, PA-C 01/06/24 1049    Trish Furl, MD 01/07/24 [phone]

## 2024-01-06 NOTE — Plan of Care (Signed)

## 2024-01-06 NOTE — H&P (Addendum)
 History and Physical    Patient: [name] [name] QIO:[id number] DOB: [birth year] DOA: 01/06/2024 DOS: the patient was seen and examined on 01/06/2024 PCP: [facility]  Patient coming from: Home  Chief Complaint:  Chief Complaint  Patient presents with   Respiratory Distress   HPI: [name] [name] is a 73 y.o. 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  Pt states that he was in his USOH until he experience SOB at 0100 that prompted him to present to the ED. Of note, pt denies any sick contacts, but does endorse having poor respiratory status for the past 2 months. He states that his pulmonologist, Girard Lam NP, recently placed him on prednisone  40mg  daily for 10 days which worked wonder for him. Unfortunately, since stopping the steroids, he has felt more SOB with very mild DOE, even as little as getting up to walk around the room, which were not issues for him previously.  In the ED, pt required ventilator support and was transitioned quickly to room air with stable VBG. Pt is admitted to medicine for ongoing care of AECOPD.  Review of Systems: As mentioned in the history of present illness. All other systems reviewed and are negative.  Past Medical History:  Diagnosis Date   Abnormal EKG 12/24/11   anteroseptal and lateral ST elevation, felt r/t early repolarization;  Cardiac cath 12/24/11 - normal coronary anatomy, EF 55-65%   Anxiety    Arthritis    Asthma    Bradycardia, sinus 12/24/11   COPD (chronic obstructive pulmonary disease) (HCC)    Dyspnea    H/O tracheostomy    History of alcohol abuse    hospitalized for detox 2002   HTN (hypertension)    Hypotension 12/24/11   in the setting of dehydration    Marijuana use    Pneumonia    Syncope and collapse 12/24/11   2/2 hypotension in the setting of dehydration   Upper airway cough syndrome     Past Surgical History:  Procedure Laterality Date   BIOPSY  08/20/2020   Procedure: BIOPSY;  Surgeon: Felecia Hopper, MD;  Location: [facility];  Service: Gastroenterology;;   CALCANEAL OSTEOTOMY Left 10/30/2022   Procedure: CALCANEAL OSTEOTOMY, SPLIT ANTERIOR TIBIAL TENDON TRANSFER, PERINEAL TENDON TRANSFER, Z- LENGTHENING OF POSTERIOR TIBIAL TENDON;  Surgeon: Ellard Gunning, DPM;  Location: [facility];  Service: Podiatry;  Laterality: Left;   CARDIAC CATHETERIZATION  12/24/11   normal coronary anatomy, EF 55-65%   CIRCUMCISION  1972   COLONOSCOPY WITH PROPOFOL  N/A 02/24/2018   Procedure: COLONOSCOPY WITH PROPOFOL ;  Surgeon: Felecia Hopper, MD;  Location: [facility];  Service: Gastroenterology;  Laterality: N/A;   COLONOSCOPY WITH PROPOFOL  N/A 08/20/2020   Procedure: COLONOSCOPY WITH PROPOFOL ;  Surgeon: Felecia Hopper, MD;  Location: [facility];  Service: Gastroenterology;  Laterality: N/A;   COLONOSCOPY WITH PROPOFOL  N/A 08/17/2023   Procedure: COLONOSCOPY WITH PROPOFOL ;  Surgeon: Felecia Hopper, MD;  Location: [facility];  Service: Gastroenterology;  Laterality: N/A;   CYSTOSCOPY W/ RETROGRADES Bilateral 03/08/2019   Procedure: CYSTOSCOPY WITH RETROGRADE PYELOGRAM;  Surgeon: Osborn Blaze, MD;  Location: [facility];  Service: Urology;  Laterality: Bilateral;   CYSTOSCOPY WITH BIOPSY N/A 03/08/2019   Procedure: CYSTOSCOPY WITH BIOPSY WITH FUGERATION;  Surgeon: Osborn Blaze, MD;  Location: [facility];  Service: Urology;  Laterality: N/A;   ESOPHAGOGASTRODUODENOSCOPY N/A 10/09/2015   Procedure: ESOPHAGOGASTRODUODENOSCOPY (EGD);  Surgeon: Royston Cornea  Mammie Sears, MD;  Location: [facility];  Service: General;  Laterality: N/A;   IRRIGATION AND DEBRIDEMENT KNEE Right 05/23/2017   Procedure: IRRIGATION AND DEBRIDEMENT KNEE;  Surgeon: Amada Backer, MD;  Location: [facility];  Service: Orthopedics;  Laterality: Right;   LACERATION REPAIR  02/2004   arthroscopic debridement of triagular fibrocartilage  tear/E-chart; right wrist   LEFT HEART CATHETERIZATION WITH CORONARY ANGIOGRAM N/A 12/24/2011   Procedure: LEFT HEART CATHETERIZATION WITH CORONARY ANGIOGRAM;  Surgeon: Peter M Swaziland, MD;  Location: [facility] CATH LAB;  Service: Cardiovascular;  Laterality: N/A;   PEG PLACEMENT N/A 10/09/2015   Procedure: PERCUTANEOUS ENDOSCOPIC GASTROSTOMY (PEG) PLACEMENT;  Surgeon: Jerryl Morin, MD;  Location[facility];  Service: General;  Laterality: N/A;   PEG TUBE REMOVAL     PERCUTANEOUS TRACHEOSTOMY N/A 10/09/2015   Procedure: PERCUTANEOUS TRACHEOSTOMY;  Surgeon: Jerryl Morin, MD;  Location: [facility] OR;  Service: General;  Laterality: N/A;   POLYPECTOMY  02/24/2018   Procedure: POLYPECTOMY;  Surgeon: Felecia Hopper, MD;  Location: [facility];  Service: Gastroenterology;;   POLYPECTOMY  08/20/2020   Procedure: POLYPECTOMY;  Surgeon: Felecia Hopper, MD;  Location: [facility];  Service: Gastroenterology;;   POLYPECTOMY  08/17/2023   Procedure: POLYPECTOMY;  Surgeon: Felecia Hopper, MD;  Location: [facility];  Service: Gastroenterology;;   SINUS ENDO WITH FUSION  06/07/2018   SINUS ENDO WITH FUSION Bilateral 06/07/2018   Procedure: SINUS ENDO WITH FUSION;  Surgeon: Vernadine Golas, MD;  Location: [facility] OR;  Service: ENT;  Laterality: Bilateral;  with fuision   UMBILICAL HERNIA REPAIR N/A 05/24/2020   Procedure: OPEN UMBILICAL HERNIA REPAIR WITH  MESH;  Surgeon: Kinsinger, Alphonso Aschoff, MD;  Location: [facility] OR;  Service: General;  Laterality: N/A;   Social History:  reports that he has never smoked. He has never been exposed to tobacco smoke. He has never used smokeless tobacco. He reports current alcohol use of about 2.0 standard drinks of alcohol per week. He reports that he does not currently use drugs after having used the following drugs: Marijuana.  No Known Allergies  Family History  Problem Relation Age of Onset   COPD Sister    Cancer Sister    Asthma Other     Prior to Admission medications   Medication  Sig Start Date End Date Taking? Authorizing Provider  acetaminophen  325 MG tablet Take 2 tablets (650 mg total) by mouth every 6 (six) hours as needed. 12/28/19   Maczis, Michael M, PA-C  aspirin  EC 81 MG tablet Take 81 mg by mouth daily. 03/29/23   [provider]  budesonide  (PULMICORT ) 0.5 MG/2ML nebulizer solution Take 2 mLs (0.5 mg total) by nebulization in the morning and at bedtime. 12/10/23   Cobb, Mariah Shines, NP  budesonide -formoterol  (SYMBICORT ) 160-4.5 MCG/ACT inhaler Inhale 2 puffs into the lungs 2 (two) times daily. 09/22/23   [provider]  cetirizine (ZYRTEC) 10 MG tablet Take 10 mg by mouth daily.    [provider]  cyclobenzaprine (FLEXERIL) 10 MG tablet Take 10 mg by mouth daily. As Directed    [provider]  finasteride  (PROSCAR ) 5 MG tablet Take 5 mg by mouth daily.    [provider]  fluticasone  (FLONASE ) 50 MCG/ACT nasal spray Instill 2 sprays in each nostril once daily 12/07/23   Cobb, Mariah Shines, NP  formoterol  (PERFOROMIST ) 20 MCG/2ML nebulizer solution Take 2 mLs (20 mcg total) by nebulization 2 (two) times daily. 12/10/23   Cobb, Mariah Shines, NP  guaiFENesin  (MUCINEX ) 600 MG 12  hr tablet Take 600 mg by mouth 2 (two) times daily.    [provider]  hydrochlorothiazide  (HYDRODIURIL ) 25 MG tablet Take 1 tablet (25 mg total) by mouth daily. Patient taking differently: Take 12.5 mg by mouth daily. 05/30/18   Kraig Peru, MD  hydrOXYzine  (ATARAX ) 25 MG tablet Take 25 mg by mouth 2 (two) times daily as needed for anxiety.    [provider]  ipratropium-albuterol  (DUONEB) 0.5-2.5 (3) MG/3ML SOLN Take 3 mLs by nebulization every 6 (six) hours as needed. 11/01/23   Cobb, Mariah Shines, NP  losartan  (COZAAR ) 25 MG tablet Take 25 mg by mouth at bedtime. 01/17/21   [provider]  montelukast  (SINGULAIR ) 10 MG tablet TAKE 1 TABLET(10 MG) BY MOUTH DAILY 12/23/23   Cobb, Katherine V, NP  Multiple Vitamin  (MULTIVITAMIN) tablet Take 1 tablet by mouth daily.    [provider]  Multiple Vitamins-Minerals (ONE-A-DAY MENS 50+) TABS as directed Orally    [provider]  pantoprazole  (PROTONIX ) 40 MG tablet TAKE ONE TABLET BY MOUTH BEFORE BREAKFAST (30 MINUTES BEFORE) 03/11/22   Diamond Formica, MD  revefenacin  (YUPELRI ) 175 MCG/3ML nebulizer solution Take 3 mLs (175 mcg total) by nebulization daily. 12/10/23   Cobb, Mariah Shines, NP  rosuvastatin  (CRESTOR ) 5 MG tablet Take 5 mg by mouth daily. 10/05/23   [provider]  sodium chloride  0.9 % nebulizer solution Take 3-4 mLs by nebulization daily as needed (Use 3-4 drops into Trach to loosen phlegm suction secretions immediatly after.). 08/17/19   [provider]  tadalafil (CIALIS) 20 MG tablet Take 20 mg by mouth once as needed for erectile dysfunction. Take 1 hour prior to sexual activity *Do not exceed 1 dose in 24 hours*    [provider]  terazosin  (HYTRIN ) 5 MG capsule Take 5 mg by mouth at bedtime.    [provider]  triamcinolone  cream (KENALOG ) 0.1 % Apply 1 Application topically 2 (two) times daily.    [provider]    Physical Exam: Vitals:   01/06/24 0815 01/06/24 0830 01/06/24 0845 01/06/24 0915  BP: (!) 150/70 122/62 118/65 120/63  Pulse: 100 100 96 87  Resp: (!) 23 (!) 27 (!) 22 20  Temp:      TempSrc:      SpO2: 100% 95% 94% 91%  Weight:      Height:       General: Alert, oriented x3, resting comfortably in no acute distress Respiratory: Diffuse wheezing throughout all lung fields Cardiovascular: Regular rate and rhythm w/o m/r/g Abdomen: Soft, nontender, nondistended. Positive bowel sounds  Data Reviewed:   Lab Results  Component Value Date   WBC 16.6 (H) 01/06/2024   HGB 14.0 01/06/2024   HCT 40.2 01/06/2024   MCV 91.0 01/06/2024   PLT 299 01/06/2024   Lab Results  Component Value Date   GLUCOSE 152 (H) 01/06/2024   CALCIUM  8.4 (L) 01/06/2024   NA  133 (L) 01/06/2024   K 3.3 (L) 01/06/2024   CO2 21 (L) 01/06/2024   CL 98 01/06/2024   BUN 11 01/06/2024   CREATININE 0.70 01/06/2024   Lab Results  Component Value Date   ALT 35 01/06/2024   AST 30 01/06/2024   ALKPHOS 57 01/06/2024   BILITOT 1.0 01/06/2024   Lab Results  Component Value Date   INR 1.29 12/19/2015   INR 1.32 10/09/2015   INR 1.35 09/24/2015    DG Chest Port 1 View Result Date: 01/06/2024 CLINICAL  DATA:  Shortness of breath EXAM: PORTABLE CHEST 1 VIEW COMPARISON:  11/02/2023 FINDINGS: Tracheostomy remains in place, unchanged. Heart and mediastinal contours are within normal limits. No focal opacities or effusions. No acute bony abnormality. Multiple old healed left rib fractures. IMPRESSION: No active disease. Electronically Signed   By: Janeece Mechanic M.D.   On: 01/06/2024 08:02    Assessment and Plan: [name] [name] is a 73 y.o. 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  AECOPD Pt with AECOPD presumably 2/2 seasonal allergies vs volume overload from recent steroid use. -D/c IV CTX/azithromycin  per ED -Continue prednisone  40mg  daily for 4 days (s/p IV solumedrol in ED) -Continue duonebs q6h sch and transition to prn as needed -S/p IV lasix  20mg  x1 in ED; f/u BNP and re-dose prn -PTA guaifenesin  600mg  BID -PTA zyrtec and montelukast  -Low threshold to initiate ventilator support prn; consider pulmonary consult if further respiratory support is required    Advance Care Planning:   Code Status: Full Code   Consults: N/A  Family Communication: Spoke with patient significant other of 35 years who was at the bedside  Severity of Illness: The appropriate patient status for this patient is INPATIENT. Inpatient status is judged to be reasonable and necessary in order to provide the required intensity of service to ensure  the patient's safety. The patient's presenting symptoms, physical exam findings, and initial radiographic and laboratory data in the context of their chronic comorbidities is felt to place them at high risk for further clinical deterioration. Furthermore, it is not anticipated that the patient will be medically stable for discharge from the hospital within 2 midnights of admission.   * I certify that at the point of admission it is my clinical judgment that the patient will require inpatient hospital care spanning beyond 2 midnights from the point of admission due to high intensity of service, high risk for further deterioration and high frequency of surveillance required.*   ------- I spent 50 minutes reviewing previous labs/notes, obtaining separate history at the bedside, counseling/discussing the treatment plan outlined above, ordering medications/tests, and performing clinical documentation.  Author: Arne Langdon, MD 01/06/2024 10:44 AM  For on call review [email or web address].

## 2024-01-06 NOTE — ED Triage Notes (Signed)
 Patient to ED by [facility] with [facility] sintce 0100. Hx of trach, given 10 albuterol , 0.5 Atrovent , 125 Solumedrol, 2 Gram Mag PTA.

## 2024-01-07 ENCOUNTER — Other Ambulatory Visit (HOSPITAL_COMMUNITY): Payer: Self-pay

## 2024-01-07 DIAGNOSIS — J441 Chronic obstructive pulmonary disease with (acute) exacerbation: Secondary | ICD-10-CM | POA: Diagnosis not present

## 2024-01-07 LAB — CBC
HCT: 37.4 % — ABNORMAL LOW (ref 39.0–52.0)
Hemoglobin: 13 g/dL (ref 13.0–17.0)
MCH: 31.3 pg (ref 26.0–34.0)
MCHC: 34.8 g/dL (ref 30.0–36.0)
MCV: 89.9 fL (ref 80.0–100.0)
Platelets: 297 10*3/uL (ref 150–400)
RBC: 4.16 MIL/uL — ABNORMAL LOW (ref 4.22–5.81)
RDW: 13.3 % (ref 11.5–15.5)
WBC: 13.6 10*3/uL — ABNORMAL HIGH (ref 4.0–10.5)
nRBC: 0 % (ref 0.0–0.2)

## 2024-01-07 LAB — BASIC METABOLIC PANEL WITH GFR
Anion gap: 11 (ref 5–15)
BUN: 10 mg/dL (ref 8–23)
CO2: 26 mmol/L (ref 22–32)
Calcium: 9 mg/dL (ref 8.9–10.3)
Chloride: 98 mmol/L (ref 98–111)
Creatinine, Ser: 0.79 mg/dL (ref 0.61–1.24)
GFR, Estimated: >60 mL/min (ref >60)
Glucose, Bld: 172 mg/dL — ABNORMAL HIGH (ref 70–99)
Potassium: 3.5 mmol/L (ref 3.5–5.1)
Sodium: 135 mmol/L (ref 135–145)

## 2024-01-07 MED ORDER — PREDNISONE 20 MG PO TABS
40.0000 mg | ORAL_TABLET | Freq: Every day | ORAL | 0 refills | Status: AC
  Filled 2024-01-07: qty 6, 3d supply, fill #0

## 2024-01-07 NOTE — Discharge Summary (Signed)
 Physician Discharge Summary   Patient: [name] [name] MRN: [id number] DOB: 1951-05-07  Admit date:     01/06/2024  Discharge date: 01/07/24  Discharge Physician: Ephriam Hashimoto   PCP: [facility]     Recommendations at discharge:  Follow up with PCP in 1 week for COPD exacerbation     Discharge Diagnoses: Principal discharge diagnosis:   COPD with acute exacerbation [facility]) Active problems:   History of tracheostomy   Asthma/allergies   Hypertension   Hypokalemia     Hospital Course: Mr. [name] [name] is a 73 y.o. M with COPD, history of tracheostomy, HTN who presented with 1 day shortness of breath, wheezing.  Was recently on a steroid burst for asthma, but once he stopped, started to get shortness of breath with exertion.  In the ER, VBG was normal, chest x-ray showed no airspace disease or opacity.  He was placed on ventilatory support of his tracheostomy, and improved.   COPD exacerbation Suspected this was seasonal allergies.  He was given steroids, bronchodilators, small dose of IV Lasix .  In the morning, he felt "wonderful", like back to normal.  He was discharged with 5-day prednisone  burst, recommended to have PCP follow-up in 5 days.               The [place]  Controlled Substances Registry was reviewed for this patient prior to discharge.  Consultants: None Procedures performed: Chest x-ray Disposition: Home Diet recommendation:  Discharge Diet Orders (From admission, onward)     Start     Ordered   01/07/24 0000  Diet - low sodium heart healthy        01/07/24 1208             DISCHARGE MEDICATION: Allergies as of 01/07/2024       Reactions   Flonase  [fluticasone ] Other (See Comments)   Caused excess mucous- does not care to use this ever again        Medication List     STOP taking these medications    budesonide  0.5 MG/2ML nebulizer solution Commonly known as: Pulmicort    formoterol  20 MCG/2ML  nebulizer solution Commonly known as: PERFOROMIST    revefenacin  175 MCG/3ML nebulizer solution Commonly known as: YUPELRI        TAKE these medications    albuterol  108 (90 Base) MCG/ACT inhaler Commonly known as: VENTOLIN  HFA Inhale 2 puffs into the lungs every 4 (four) hours as needed for wheezing or shortness of breath.   albuterol  (2.5 MG/3ML) 0.083% nebulizer solution Commonly known as: PROVENTIL  Take 2.5 mg by nebulization 3 (three) times daily as needed for wheezing or shortness of breath.   APAP 325 MG tablet Take 2 tablets (650 mg total) by mouth every 6 (six) hours as needed. What changed: reasons to take this   Bayer Low Dose 81 MG tablet Generic drug: aspirin  EC Take 81 mg by mouth in the morning. Swallow whole.   budesonide -formoterol  160-4.5 MCG/ACT inhaler Commonly known as: SYMBICORT  Inhale 2 puffs into the lungs 2 (two) times daily.   cetirizine 10 MG tablet Commonly known as: ZYRTEC Take 10 mg by mouth daily.   cyclobenzaprine 10 MG tablet Commonly known as: FLEXERIL Take 10 mg by mouth daily as needed for muscle spasms.   finasteride  5 MG tablet Commonly known as: PROSCAR  Take 5 mg by mouth daily.   fluticasone  50 MCG/ACT nasal spray Commonly known as: FLONASE  Instill 2 sprays in each nostril once daily   hydrochlorothiazide  25 MG tablet Commonly known  as: HYDRODIURIL  Take 1 tablet (25 mg total) by mouth daily.   ipratropium-albuterol  0.5-2.5 (3) MG/3ML Soln Commonly known as: DUONEB Take 3 mLs by nebulization every 6 (six) hours as needed. What changed:  when to take this additional instructions   losartan  50 MG tablet Commonly known as: COZAAR  Take 25 mg by mouth in the morning.   montelukast  10 MG tablet Commonly known as: SINGULAIR  TAKE 1 TABLET(10 MG) BY MOUTH DAILY   Mucinex  600 MG 12 hr tablet Generic drug: guaiFENesin  Take 600 mg by mouth 2 (two) times daily.   One-A-Day Mens 50+ Tabs Take 1 tablet by mouth daily with  breakfast.   pantoprazole  40 MG tablet Commonly known as: PROTONIX  TAKE ONE TABLET BY MOUTH BEFORE BREAKFAST (30 MINUTES BEFORE)   predniSONE  20 MG tablet Commonly known as: DELTASONE  Take 2 tablets (40 mg total) by mouth daily with breakfast for 3 days. Start taking on: January 08, 2024   rosuvastatin  5 MG tablet Commonly known as: CRESTOR  Take 5 mg by mouth daily.   sodium chloride  0.9 % nebulizer solution Take 3 mLs by nebulization daily as needed (Use 3 ml's or 3-4 drops into trach to loosen phlegm suction secretions or as otherwise instructed).   tadalafil 20 MG tablet Commonly known as: CIALIS Take 20 mg by mouth once as needed for erectile dysfunction (1 hour prior to sexual activity *Do not exceed 1 dose in 24 hours*).   terazosin  5 MG capsule Commonly known as: HYTRIN  Take 5 mg by mouth at bedtime.        Follow-up Information     [facility] on 01/12/2024.   Why: Has appt with Dr Esther Hem  at [address] information: [address] [place] [address] [phone]                 Discharge Instructions     Diet - low sodium heart healthy   Complete by: As directed    Discharge instructions   Complete by: As directed    **IMPORTANT DISCHARGE INSTRUCTIONS**   From Dr. Darlyn Eke: You were admitted for a COPD flare  Here, you were treated with steroids and breathing treatments and improved.    Continue prednisone  for total five days (three more days starting tomorrow)  For the next week, use Duo-neb or nebulizer albuterol  three times daily on schedule  CONTINUE your home Breo  CONTINUE Flonase  nasal spray or another steroid nasal spray after you finish the prednisone , until allergy season is over  Go see your prijmary doctor next week  Resume your other home medicinses   Increase activity slowly   Complete by: As directed        Discharge Exam: Filed Weights   01/06/24 0658  Weight: 78 kg     General: Pt is alert, awake, not in acute distress sitting up in bed, speaking with a Passy-Muir valve, no acute distress Cardiovascular: RRR, nl S1-S2, no murmurs appreciated.   No LE edema.   Respiratory: Normal respiratory rate and rhythm.  Diminished but no rales or wheezing Abdominal: Abdomen soft and non-tender.  No distension or HSM.   Neuro/Psych: Strength symmetric in upper and lower extremities.  Judgment and insight appear normal .   Condition at discharge: good  The results of significant diagnostics from this hospitalization (including imaging, microbiology, ancillary and laboratory) are listed below for reference.   Imaging Studies: DG Chest Port 1 View Result Date: 01/06/2024 CLINICAL DATA:  Shortness of breath  EXAM: PORTABLE CHEST 1 VIEW COMPARISON:  11/02/2023 FINDINGS: Tracheostomy remains in place, unchanged. Heart and mediastinal contours are within normal limits. No focal opacities or effusions. No acute bony abnormality. Multiple old healed left rib fractures. IMPRESSION: No active disease. Electronically Signed   By: Janeece Mechanic M.D.   On: 01/06/2024 08:02    Microbiology: Results for orders placed or performed during the hospital encounter of 01/06/24  Resp panel by RT-PCR (RSV, Flu A&B, Covid) Anterior Nasal Swab     Status: None   Collection Time: 01/06/24  6:53 AM   Specimen: Anterior Nasal Swab  Result Value Ref Range Status   SARS Coronavirus 2 by RT PCR NEGATIVE NEGATIVE Final   Influenza A by PCR NEGATIVE NEGATIVE Final   Influenza B by PCR NEGATIVE NEGATIVE Final    Comment: (NOTE) The Xpert Xpress SARS-CoV-2/FLU/RSV plus assay is intended as an aid in the diagnosis of influenza from Nasopharyngeal swab specimens and should not be used as a sole basis for treatment. Nasal washings and aspirates are unacceptable for Xpert Xpress SARS-CoV-2/FLU/RSV testing.  Fact Sheet for Patients: BloggerCourse.com  Fact Sheet for  Healthcare Providers: SeriousBroker.it  This test is not yet approved or cleared by the [place]  FDA and has been authorized for detection and/or diagnosis of SARS-CoV-2 by FDA under an Emergency Use Authorization (EUA). This EUA will remain in effect (meaning this test can be used) for the duration of the COVID-19 declaration under Section 564(b)(1) of the Act, 21 U.S.C. section 360bbb-3(b)(1), unless the authorization is terminated or revoked.     Resp Syncytial Virus by PCR NEGATIVE NEGATIVE Final    Comment: (NOTE) Fact Sheet for Patients: BloggerCourse.com  Fact Sheet for Healthcare Providers: SeriousBroker.it  This test is not yet approved or cleared by the [place]  FDA and has been authorized for detection and/or diagnosis of SARS-CoV-2 by FDA under an Emergency Use Authorization (EUA). This EUA will remain in effect (meaning this test can be used) for the duration of the COVID-19 declaration under Section 564(b)(1) of the Act, 21 U.S.C. section 360bbb-3(b)(1), unless the authorization is terminated or revoked.  Performed at [facility], [address], [place], [place] [address]   Respiratory (~20 pathogens) panel by PCR     Status: None   Collection Time: 01/06/24  7:02 AM   Specimen: Nasopharyngeal Swab; Respiratory  Result Value Ref Range Status   Adenovirus NOT DETECTED NOT DETECTED Final   Coronavirus 229E NOT DETECTED NOT DETECTED Final    Comment: (NOTE) The Coronavirus on the Respiratory Panel, DOES NOT test for the novel  Coronavirus (2019 nCoV)    Coronavirus HKU1 NOT DETECTED NOT DETECTED Final   Coronavirus NL63 NOT DETECTED NOT DETECTED Final   Coronavirus OC43 NOT DETECTED NOT DETECTED Final   Metapneumovirus NOT DETECTED NOT DETECTED Final   Rhinovirus / Enterovirus NOT DETECTED NOT DETECTED Final   Influenza A NOT DETECTED NOT DETECTED Final   Influenza B  NOT DETECTED NOT DETECTED Final   Parainfluenza Virus 1 NOT DETECTED NOT DETECTED Final   Parainfluenza Virus 2 NOT DETECTED NOT DETECTED Final   Parainfluenza Virus 3 NOT DETECTED NOT DETECTED Final   Parainfluenza Virus 4 NOT DETECTED NOT DETECTED Final   Respiratory Syncytial Virus NOT DETECTED NOT DETECTED Final   Bordetella pertussis NOT DETECTED NOT DETECTED Final   Bordetella Parapertussis NOT DETECTED NOT DETECTED Final   Chlamydophila pneumoniae NOT DETECTED NOT DETECTED Final   Mycoplasma pneumoniae NOT DETECTED NOT DETECTED  Final    Comment: Performed at [facility], [address] [place], [place] [address]  Culture, blood (routine x 2)     Status: None (Preliminary result)   Collection Time: 01/06/24  8:59 AM   Specimen: BLOOD LEFT FOREARM  Result Value Ref Range Status   Specimen Description BLOOD LEFT FOREARM  Final   Special Requests   Final    BOTTLES DRAWN AEROBIC AND ANAEROBIC Blood Culture adequate volume   Culture   Final    NO GROWTH < 24 HOURS Performed at [facility], [address] [place], [place] [address]    Report Status PENDING  Incomplete  Culture, blood (routine x 2) Call MD if unable to obtain prior to antibiotics being given     Status: None (Preliminary result)   Collection Time: 01/06/24 11:41 AM   Specimen: BLOOD RIGHT HAND  Result Value Ref Range Status   Specimen Description BLOOD RIGHT HAND  Final   Special Requests   Final    BOTTLES DRAWN AEROBIC AND ANAEROBIC Blood Culture adequate volume   Culture   Final    NO GROWTH < 24 HOURS Performed at [facility], [address], [place], [place] [address]    Report Status PENDING  Incomplete  Culture, blood (routine x 2)     Status: None (Preliminary result)   Collection Time: 01/06/24  3:05 PM   Specimen: BLOOD  Result Value Ref Range Status   Specimen Description BLOOD SITE NOT SPECIFIED  Final   Special Requests   Final    BOTTLES DRAWN AEROBIC ONLY Blood Culture  results may not be optimal due to an inadequate volume of blood received in culture bottles   Culture   Final    NO GROWTH < 24 HOURS Performed at [facility], [address], [place], [place] [address]    Report Status PENDING  Incomplete  Culture, blood (routine x 2) Call MD if unable to obtain prior to antibiotics being given     Status: None (Preliminary result)   Collection Time: 01/06/24  3:05 PM   Specimen: BLOOD  Result Value Ref Range Status   Specimen Description BLOOD SITE NOT SPECIFIED  Final   Special Requests   Final    BOTTLES DRAWN AEROBIC ONLY Blood Culture results may not be optimal due to an inadequate volume of blood received in culture bottles   Culture   Final    NO GROWTH < 24 HOURS Performed at [facility], [address] [place], [place] [address]    Report Status PENDING  Incomplete    Labs: CBC: Recent Labs  Lab 01/06/24 0715 01/06/24 0756 01/06/24 1141 01/07/24 0942  WBC  --  16.6* 14.0* 13.6*  HGB 14.6  14.3 14.0 14.1 13.0  HCT 43.0  42.0 40.2 41.6 37.4*  MCV  --  91.0 92.2 89.9  PLT  --  299 275 297   Basic Metabolic Panel: Recent Labs  Lab 01/06/24 0715 01/06/24 0810 01/06/24 1141 01/07/24 0942  NA 134*  133* 133*  --  135  K 3.5  3.5 3.3*  --  3.5  CL 100 98  --  98  CO2  --  21*  --  26  GLUCOSE 127* 152*  --  172*  BUN 12 11  --  10  CREATININE 0.80 0.70 0.69 0.79  CALCIUM   --  8.4*  --  9.0   Liver Function  Tests: Recent Labs  Lab 01/06/24 0810  AST 30  ALT 35  ALKPHOS 57  BILITOT 1.0  PROT 6.8  ALBUMIN  3.6   CBG: No results for input(s): "GLUCAP" in the last 168 hours.  Discharge time spent: approximately 25 minutes spent on discharge counseling, evaluation of patient on day of discharge, and coordination of discharge planning with nursing, social work, pharmacy and case management  Signed: Ephriam Hashimoto, MD [facility] 01/07/2024

## 2024-01-07 NOTE — Plan of Care (Signed)

## 2024-01-11 LAB — CULTURE, BLOOD (ROUTINE X 2)
Culture: NO GROWTH
Culture: NO GROWTH
Culture: NO GROWTH
Culture: NO GROWTH
Special Requests: ADEQUATE
Special Requests: ADEQUATE

## 2024-01-19 ENCOUNTER — Encounter: Payer: Self-pay | Admitting: Nurse Practitioner

## 2024-01-19 ENCOUNTER — Ambulatory Visit (INDEPENDENT_AMBULATORY_CARE_PROVIDER_SITE_OTHER): Payer: 59 | Admitting: Nurse Practitioner

## 2024-01-19 VITALS — BP 122/80 | HR 75 | Ht 65.0 in | Wt 161.0 lb

## 2024-01-19 DIAGNOSIS — J189 Pneumonia, unspecified organism: Secondary | ICD-10-CM

## 2024-01-19 DIAGNOSIS — J3802 Paralysis of vocal cords and larynx, bilateral: Secondary | ICD-10-CM | POA: Diagnosis not present

## 2024-01-19 DIAGNOSIS — J4489 Other specified chronic obstructive pulmonary disease: Secondary | ICD-10-CM | POA: Diagnosis not present
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
## 2024-01-19 NOTE — Patient Instructions (Signed)
 Continue Albuterol  inhaler 2 puffs every 6 hours as needed for shortness of breath or wheezing. Notify if symptoms persist despite rescue inhaler/neb use.  Continue zyrtec 10 mg daily for allergies Continue singulair  10 mg At bedtime  Continue flonase  nasal spray 2 sprays each nostril daily Continue mucinex  (guaifenesin ) 600 mg Twice daily to loosen phlegm Continue sodium chloride  nebs 3-4 mL once daily as needed; can also use 3-4 drops into the trach to loosen phlegm before suctioning   Continue Symbicort /Breyna  (budesonide -formoterol ) 2 puffs Twice daily until you get your breathing treatments from the [place] In the meantime, use duoneb 3 mL nebulizer treatment 3-4 times a day and keep doing your Symbicort /Breyna  inhaler .    If they end up covering the nebulized medications I ordered, you will stop the Symbicort  and duoneb and switch to  -budesonide  neb twice a day. Brush tongue and rinse mouth afterwards  - formoterol  neb twice a day  - Yupelri  neb once a day.  Call us  before you do this if you have any questions    Follow up in 4 months with Dr. Waymond Hailey or Gina Lagos. If symptoms do not improve or worsen, please contact office for sooner follow up or seek emergency care.

## 2024-01-20 ENCOUNTER — Encounter: Payer: Self-pay | Admitting: Nurse Practitioner

## 2024-01-20 NOTE — Assessment & Plan Note (Signed)
 Treated for staph pneumonia 10/18/2023. He also grew out pseudomonas; possible colonization. He was treated with 10 days of ciprofloxacin  and clinically improved. Resolved on recent imaging. Continue mucociliary clearance therapies as needed. Strict return precautions.

## 2024-01-20 NOTE — Assessment & Plan Note (Addendum)
 Permanent tracheostomy. Followed by ENT

## 2024-01-20 NOTE — Assessment & Plan Note (Signed)
 Moderate to severe obstruction. Resolved exacerbation. Recommended change to triple therapy nebs; however[facility]. His [facility] informed him that these should be covered. Will reach out to [facility] pharmacy to determine next steps. In interim, will have him continue to use duonebs 2-3 times a day and Symbicort . Action plan in place. Close follow up.   Patient Instructions  Continue Albuterol  inhaler 2 puffs every 6 hours as needed for shortness of breath or wheezing. Notify if symptoms persist despite rescue inhaler/neb use.  Continue zyrtec 10 mg daily for allergies Continue singulair  10 mg At bedtime  Continue flonase  nasal spray 2 sprays each nostril daily Continue mucinex  (guaifenesin ) 600 mg Twice daily to loosen phlegm Continue sodium chloride  nebs 3-4 mL once daily as needed; can also use 3-4 drops into the trach to loosen phlegm before suctioning   Continue Symbicort /Breyna  (budesonide -formoterol ) 2 puffs Twice daily until you get your breathing treatments from the [place] In the meantime, use duoneb 3 mL nebulizer treatment 3-4 times a day and keep doing your Symbicort /Breyna  inhaler .    If they end up covering the nebulized medications I ordered, you will stop the Symbicort  and duoneb and switch to  -budesonide  neb twice a day. Brush tongue and rinse mouth afterwards  - formoterol  neb twice a day  - Yupelri  neb once a day.  Call us  before you do this if you have any questions    Follow up in 4 months with Dr. Waymond Hailey or Gina Lagos. If symptoms do not improve or worsen, please contact office for sooner follow up or seek emergency care.

## 2024-01-21 ENCOUNTER — Telehealth: Payer: Self-pay

## 2024-01-21 DIAGNOSIS — J4489 Other specified chronic obstructive pulmonary disease: Secondary | ICD-10-CM

## 2024-01-21 NOTE — Telephone Encounter (Signed)
 [facility] was contacting the [place] regarding this. The Rx's have been sent two other times before. [facility], did you find out what else the [place] was needing? If we need to send updated rx, I can sign Tuesday

## 2024-01-21 NOTE — Telephone Encounter (Signed)
 Copied from CRM [id number]. Topic: Clinical - Prescription Issue >> Jan 21, 2024  9:57 AM Juliana Ocean wrote: Reason for CRM: pt states the [facility] pharmacy doesn't know what Rx the dr sent in for him.  Pt keeps calling them, but they do not have anything.  [facility] and let them know [phone]

## 2024-01-21 NOTE — Telephone Encounter (Signed)
 Per [name] [name] lov "Continue Symbicort /Breyna  (budesonide -formoterol ) 2 puffs Twice daily until you get your breathing treatments from the [place] In the meantime, use duoneb 3 mL nebulizer treatment 3-4 times a day and keep doing your Symbicort /Breyna  inhaler .  If they end up covering the nebulized medications I ordered, you will stop the Symbicort  and duoneb and switch to  -budesonide  neb twice a day. Brush tongue and rinse mouth afterwards  - formoterol  neb twice a day  - Yupelri  neb once a day."  Medications were not sent to [place], [name] [name] can you please advise if budesonide , formoterol  and yupelri  need to be sent to [place]?

## 2024-01-24 ENCOUNTER — Other Ambulatory Visit: Payer: Self-pay

## 2024-01-24 ENCOUNTER — Telehealth: Payer: Self-pay

## 2024-01-24 NOTE — Telephone Encounter (Signed)
 [facility] (b[facility]ide , yupelri  and formoterol ). They stated they would call back once they get some answers.

## 2024-01-25 DIAGNOSIS — J324 Chronic pansinusitis: Secondary | ICD-10-CM | POA: Diagnosis not present

## 2024-01-27 DIAGNOSIS — Z930 Tracheostomy status: Secondary | ICD-10-CM | POA: Diagnosis not present

## 2024-01-28 NOTE — Telephone Encounter (Signed)
&   days old. Please address Ms. Cobb's question. Thank you.

## 2024-01-31 MED ORDER — REVEFENACIN 175 MCG/3ML IN SOLN: 175.0000 ug | Freq: Every day | RESPIRATORY_TRACT | 3 refills | Status: DC

## 2024-01-31 MED ORDER — FORMOTEROL FUMARATE 20 MCG/2ML IN NEBU: 20.0000 ug | INHALATION_SOLUTION | Freq: Two times a day (BID) | RESPIRATORY_TRACT | 3 refills | Status: DC

## 2024-01-31 MED ORDER — BUDESONIDE 0.5 MG/2ML IN SUSP: 0.5000 mg | Freq: Two times a day (BID) | RESPIRATORY_TRACT | 3 refills | Status: DC

## 2024-01-31 NOTE — Telephone Encounter (Signed)
 Can you please print and bring over to have me sign? I put the orders in but they didn't print. Printer difficulties here. Thanks!

## 2024-01-31 NOTE — Telephone Encounter (Signed)
 Spoke with [facility]. They are needing new rx with prescription information faxed to [phone].  Katie please advise on which rx's you would like sent to [place]. Script needs to be printed and faxed.  "Continue Albuterol  inhaler 2 puffs every 6 hours as needed for shortness of breath or wheezing. Notify if symptoms persist despite rescue inhaler/neb use.  Continue zyrtec 10 mg daily for allergies Continue singulair  10 mg At bedtime  Continue flonase  nasal spray 2 sprays each nostril daily Continue mucinex  (guaifenesin ) 600 mg Twice daily to loosen phlegm Continue sodium chloride  nebs 3-4 mL once daily as needed; can also use 3-4 drops into the trach to loosen phlegm before suctioning Continue Symbicort /Breyna  (budesonide -formoterol ) 2 puffs Twice daily until you get your breathing treatments from the [place] In the meantime, use duoneb 3 mL nebulizer treatment 3-4 times a day and keep doing your Symbicort /Breyna  inhaler .  If they end up covering the nebulized medications I ordered, you will stop the Symbicort  and duoneb and switch to  -budesonide  neb twice a day. Brush tongue and rinse mouth afterwards  - formoterol  neb twice a day  - Yupelri  neb once a day."

## 2024-02-01 DIAGNOSIS — I10 Essential (primary) hypertension: Secondary | ICD-10-CM | POA: Diagnosis not present

## 2024-02-01 DIAGNOSIS — J449 Chronic obstructive pulmonary disease, unspecified: Secondary | ICD-10-CM | POA: Diagnosis not present

## 2024-02-01 DIAGNOSIS — J9620 Acute and chronic respiratory failure, unspecified whether with hypoxia or hypercapnia: Secondary | ICD-10-CM | POA: Diagnosis not present

## 2024-02-01 NOTE — Telephone Encounter (Signed)
 Rx has been signed by Alston Jerry and placed in [employer]. NFN

## 2024-02-28 DIAGNOSIS — Z930 Tracheostomy status: Secondary | ICD-10-CM | POA: Diagnosis not present

## 2024-03-01 DIAGNOSIS — Z430 Encounter for attention to tracheostomy: Secondary | ICD-10-CM | POA: Diagnosis not present

## 2024-03-24 DIAGNOSIS — Z430 Encounter for attention to tracheostomy: Secondary | ICD-10-CM | POA: Diagnosis not present

## 2024-04-14 DIAGNOSIS — Z930 Tracheostomy status: Secondary | ICD-10-CM | POA: Diagnosis not present

## 2024-05-09 DIAGNOSIS — J449 Chronic obstructive pulmonary disease, unspecified: Secondary | ICD-10-CM | POA: Diagnosis not present

## 2024-05-09 DIAGNOSIS — Z930 Tracheostomy status: Secondary | ICD-10-CM | POA: Diagnosis not present

## 2024-05-09 DIAGNOSIS — R052 Subacute cough: Secondary | ICD-10-CM | POA: Diagnosis not present

## 2024-05-09 DIAGNOSIS — J0191 Acute recurrent sinusitis, unspecified: Secondary | ICD-10-CM | POA: Diagnosis not present

## 2024-05-09 DIAGNOSIS — R2689 Other abnormalities of gait and mobility: Secondary | ICD-10-CM | POA: Diagnosis not present

## 2024-05-09 DIAGNOSIS — R260 Ataxic gait: Secondary | ICD-10-CM | POA: Diagnosis not present

## 2024-05-09 DIAGNOSIS — J9620 Acute and chronic respiratory failure, unspecified whether with hypoxia or hypercapnia: Secondary | ICD-10-CM | POA: Diagnosis not present

## 2024-05-10 ENCOUNTER — Other Ambulatory Visit: Payer: Self-pay | Admitting: Family Medicine

## 2024-05-10 DIAGNOSIS — R2689 Other abnormalities of gait and mobility: Secondary | ICD-10-CM

## 2024-05-10 DIAGNOSIS — R260 Ataxic gait: Secondary | ICD-10-CM

## 2024-05-14 ENCOUNTER — Ambulatory Visit
Admission: RE | Admit: 2024-05-14 | Discharge: 2024-05-14 | Disposition: A | Discharge status: Home / Self Care | Source: Ambulatory Visit | Attending: Family Medicine | Admitting: Family Medicine

## 2024-05-14 DIAGNOSIS — R260 Ataxic gait: Secondary | ICD-10-CM

## 2024-05-14 DIAGNOSIS — R2689 Other abnormalities of gait and mobility: Secondary | ICD-10-CM

## 2024-05-14 DIAGNOSIS — R262 Difficulty in walking, not elsewhere classified: Secondary | ICD-10-CM | POA: Diagnosis not present

## 2024-05-14 MED ORDER — GADOPICLENOL 0.5 MMOL/ML IV SOLN
7.0000 mL | Freq: Once | INTRAVENOUS | Status: AC | PRN
  Administered 2024-05-14: 7 mL via INTRAVENOUS

## 2024-05-17 ENCOUNTER — Emergency Department (HOSPITAL_COMMUNITY)

## 2024-05-17 ENCOUNTER — Emergency Department (HOSPITAL_COMMUNITY)
Admission: EM | Admit: 2024-05-17 | Discharge: 2024-05-17 | Disposition: A | Discharge status: Home / Self Care | Attending: Emergency Medicine | Admitting: Emergency Medicine

## 2024-05-17 ENCOUNTER — Encounter (HOSPITAL_COMMUNITY): Payer: Self-pay

## 2024-05-17 ENCOUNTER — Ambulatory Visit: Payer: Self-pay

## 2024-05-17 ENCOUNTER — Other Ambulatory Visit: Payer: Self-pay

## 2024-05-17 DIAGNOSIS — Z7951 Long term (current) use of inhaled steroids: Secondary | ICD-10-CM | POA: Insufficient documentation

## 2024-05-17 DIAGNOSIS — Z743 Need for continuous supervision: Secondary | ICD-10-CM | POA: Diagnosis not present

## 2024-05-17 DIAGNOSIS — Z79899 Other long term (current) drug therapy: Secondary | ICD-10-CM | POA: Insufficient documentation

## 2024-05-17 DIAGNOSIS — R0602 Shortness of breath: Secondary | ICD-10-CM | POA: Diagnosis not present

## 2024-05-17 DIAGNOSIS — J441 Chronic obstructive pulmonary disease with (acute) exacerbation: Secondary | ICD-10-CM | POA: Diagnosis not present

## 2024-05-17 DIAGNOSIS — I10 Essential (primary) hypertension: Secondary | ICD-10-CM | POA: Diagnosis not present

## 2024-05-17 LAB — CBC WITH DIFFERENTIAL/PLATELET
Abs Immature Granulocytes: 0.01 K/uL (ref 0.00–0.07)
Basophils Absolute: 0 K/uL (ref 0.0–0.1)
Basophils Relative: 1 %
Eosinophils Absolute: 0.5 K/uL (ref 0.0–0.5)
Eosinophils Relative: 10 %
HCT: 41 % (ref 39.0–52.0)
Hemoglobin: 13.9 g/dL (ref 13.0–17.0)
Immature Granulocytes: 0 %
Lymphocytes Relative: 32 %
Lymphs Abs: 1.6 K/uL (ref 0.7–4.0)
MCH: 31.4 pg (ref 26.0–34.0)
MCHC: 33.9 g/dL (ref 30.0–36.0)
MCV: 92.6 fL (ref 80.0–100.0)
Monocytes Absolute: 0.6 K/uL (ref 0.1–1.0)
Monocytes Relative: 11 %
Neutro Abs: 2.3 K/uL (ref 1.7–7.7)
Neutrophils Relative %: 46 %
Platelets: 262 K/uL (ref 150–400)
RBC: 4.43 MIL/uL (ref 4.22–5.81)
RDW: 12.4 % (ref 11.5–15.5)
WBC: 5.1 K/uL (ref 4.0–10.5)
nRBC: 0 % (ref 0.0–0.2)

## 2024-05-17 LAB — COMPREHENSIVE METABOLIC PANEL WITH GFR
ALT: 35 U/L (ref 0–44)
AST: 32 U/L (ref 15–41)
Albumin: 3.6 g/dL (ref 3.5–5.0)
Alkaline Phosphatase: 51 U/L (ref 38–126)
Anion gap: 10 (ref 5–15)
BUN: 9 mg/dL (ref 8–23)
CO2: 22 mmol/L (ref 22–32)
Calcium: 9.3 mg/dL (ref 8.9–10.3)
Chloride: 100 mmol/L (ref 98–111)
Creatinine, Ser: 0.76 mg/dL (ref 0.61–1.24)
GFR, Estimated: >60 mL/min (ref >60)
Glucose, Bld: 85 mg/dL (ref 70–99)
Potassium: 4.1 mmol/L (ref 3.5–5.1)
Sodium: 132 mmol/L — ABNORMAL LOW (ref 135–145)
Total Bilirubin: 0.9 mg/dL (ref 0.0–1.2)
Total Protein: 6.7 g/dL (ref 6.5–8.1)

## 2024-05-17 LAB — RESP PANEL BY RT-PCR (RSV, FLU A&B, COVID)  RVPGX2
Influenza A by PCR: NEGATIVE
Influenza B by PCR: NEGATIVE
Resp Syncytial Virus by PCR: NEGATIVE
SARS Coronavirus 2 by RT PCR: NEGATIVE

## 2024-05-17 MED ORDER — METHYLPREDNISOLONE 4 MG PO TBPK: ORAL_TABLET | ORAL | 0 refills | Status: DC

## 2024-05-17 MED ORDER — ALBUTEROL SULFATE (2.5 MG/3ML) 0.083% IN NEBU
10.0000 mg | INHALATION_SOLUTION | Freq: Once | RESPIRATORY_TRACT | Status: AC
  Administered 2024-05-17: 10 mg via RESPIRATORY_TRACT
  Filled 2024-05-17: qty 12

## 2024-05-17 MED ORDER — METHYLPREDNISOLONE SODIUM SUCC 125 MG IJ SOLR
125.0000 mg | Freq: Once | INTRAMUSCULAR | Status: AC
  Administered 2024-05-17: 125 mg via INTRAVENOUS
  Filled 2024-05-17: qty 2

## 2024-05-17 MED ORDER — MAGNESIUM SULFATE 2 GM/50ML IV SOLN
2.0000 g | Freq: Once | INTRAVENOUS | Status: AC
  Administered 2024-05-17: 2 g via INTRAVENOUS
  Filled 2024-05-17: qty 50

## 2024-05-17 MED ORDER — IPRATROPIUM-ALBUTEROL 0.5-2.5 (3) MG/3ML IN SOLN
3.0000 mL | Freq: Once | RESPIRATORY_TRACT | Status: AC
  Administered 2024-05-17: 3 mL via RESPIRATORY_TRACT
  Filled 2024-05-17: qty 3

## 2024-05-17 NOTE — Discharge Instructions (Signed)
 How is this prevented? Follow your COPD action plan. The action plan tells you what to do if you're feeling good and what to do when you start feeling worse. Discuss the plan often with your provider. Make sure you get all the shots, also called vaccines, that your provider recommends. Ask your provider about a flu shot and a pneumonia shot. Use oxygen  therapy if told by your provider. If you need home oxygen  therapy, ask your provider how often to check your oxygen  level with a device called an oximeter. Keep all follow-up visits to review your COPD action plan. Your provider will want to check on your condition often to keep you healthy and out of the hospital. Contact a health care provider if: Your COPD symptoms get worse. You have a fever or chills. You have trouble doing daily activities. You have trouble breathing even when you are resting. Get help right away if: You are short of breath and cannot: Talk in full sentences. Do normal activities. You have chest pain. You feel confused. These symptoms may be an emergency. Call 911 right away. Do not wait to see if the symptoms will go away. Do not drive yourself to the hospital.

## 2024-05-17 NOTE — ED Provider Notes (Signed)
 [place] EMERGENCY DEPARTMENT AT [facility] Provider Note   CSN: [id number] Arrival date & time: 05/17/24  1304     Patient presents with: pnuemonia   [name] [name] is a 73 y.o. male.   Patient complains of shortness of breath.  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  Patient reports that he has had increasing shortness of breath for the last 3 days.  He has been doing his albuterol  nebs using his inhalers.  Patient states he manages his own trach and he has not had any problems with the trach clogging.  Patient is concerned that he has pneumonia.  Patient had pneumonia 3 weeks ago.  He feels the same.  Patient states he is not on oxygen  at home.  Patient is requiring oxygen  here today.  Patient has a past medical history of asthma COPD, vocal cord dysfunctionTracheostomy and asthma.  The history is provided by the patient. No language interpreter was used.       Prior to Admission medications   Medication Sig Start Date End Date Taking? Authorizing Provider  acetaminophen  325 MG tablet Take 2 tablets (650 mg total) by mouth every 6 (six) hours as needed. Patient taking differently: Take 650 mg by mouth every 6 (six) hours as needed for pain (or headaches). 12/28/19   Maczis, Michael M, PA-C  albuterol  (PROVENTIL ) (2.5 MG/3ML) 0.083% nebulizer solution Take 2.5 mg by nebulization 3 (three) times daily as needed for wheezing or shortness of breath.    [provider]  albuterol  (VENTOLIN  HFA) 108 (90 Base) MCG/ACT inhaler Inhale 2 puffs into the lungs every 4 (four) hours as needed for wheezing or shortness of breath.    [provider]  BAYER LOW DOSE 81 MG tablet Take 81 mg by mouth in the morning. Swallow whole.    [provider]  budesonide  (PULMICORT ) 0.5 MG/2ML nebulizer solution Take 2 mLs (0.5 mg total) by nebulization in the morning and at bedtime. 01/31/24   Cobb, Comer GAILS, NP   budesonide -formoterol  (SYMBICORT ) 160-4.5 MCG/ACT inhaler Inhale 2 puffs into the lungs 2 (two) times daily. 09/22/23   [provider]  cetirizine (ZYRTEC) 10 MG tablet Take 10 mg by mouth daily.    [provider]  cyclobenzaprine (FLEXERIL) 10 MG tablet Take 10 mg by mouth daily as needed for muscle spasms.    [provider]  finasteride  (PROSCAR ) 5 MG tablet Take 5 mg by mouth daily.    [provider]  fluticasone  (FLONASE ) 50 MCG/ACT nasal spray Instill 2 sprays in each nostril once daily Patient not taking: Reported on 01/06/2024 12/07/23   Malachy Comer GAILS, NP  formoterol  (PERFOROMIST ) 20 MCG/2ML nebulizer solution Take 2 mLs (20 mcg total) by nebulization 2 (two) times daily. 01/31/24   Cobb, Comer GAILS, NP  guaiFENesin  (MUCINEX ) 600 MG 12 hr tablet Take 600 mg by mouth 2 (two) times daily.    [provider]  hydrochlorothiazide  (HYDRODIURIL ) 25 MG tablet Take 1 tablet (25 mg total) by mouth daily. 05/30/18   Tobie Yetta HERO, MD  ipratropium-albuterol  (DUONEB) 0.5-2.5 (3) MG/3ML SOLN Take 3 mLs by nebulization every 6 (six) hours as needed. Patient taking differently: Take 3 mLs by nebulization See admin instructions. Nebulize 3 ml's and inhale into the lungs every 2-4 hours as needed for shortness of breath or wheezing 11/01/23   Cobb, Comer GAILS, NP  losartan  (COZAAR ) 50 MG tablet Take 25 mg by mouth in the  morning.    [provider]  montelukast  (SINGULAIR ) 10 MG tablet TAKE 1 TABLET(10 MG) BY MOUTH DAILY 12/23/23   Cobb, Comer GAILS, NP  Multiple Vitamins-Minerals (ONE-A-DAY MENS 50+) TABS Take 1 tablet by mouth daily with breakfast.    [provider]  pantoprazole  (PROTONIX ) 40 MG tablet TAKE ONE TABLET BY MOUTH BEFORE BREAKFAST (30 MINUTES BEFORE) Patient not taking: Reported on 01/06/2024 03/11/22   Darlean Ozell NOVAK, MD  revefenacin  (YUPELRI ) 175 MCG/3ML nebulizer solution Take 3 mLs (175 mcg total) by nebulization daily.  01/31/24   Cobb, Comer GAILS, NP  rosuvastatin  (CRESTOR ) 5 MG tablet Take 5 mg by mouth daily. 10/05/23   [provider]  sodium chloride  0.9 % nebulizer solution Take 3 mLs by nebulization daily as needed (Use 3 ml's or 3-4 drops into trach to loosen phlegm suction secretions or as otherwise instructed). 08/17/19   [provider]  tadalafil (CIALIS) 20 MG tablet Take 20 mg by mouth once as needed for erectile dysfunction (1 hour prior to sexual activity *Do not exceed 1 dose in 24 hours*).    [provider]  terazosin  (HYTRIN ) 5 MG capsule Take 5 mg by mouth at bedtime.    [provider]    Allergies: Flonase  [fluticasone ]    Review of Systems  All other systems reviewed and are negative.   Updated Vital Signs BP 113/81   Pulse 74   Temp 98.1 F (36.7 C) (Oral)   Resp 19   Ht 5' 5 (1.651 m)   Wt 71.2 kg   SpO2 100%   BMI 26.13 kg/m   Physical Exam Vitals and nursing note reviewed.  Constitutional:      Appearance: He is well-developed.  HENT:     Head: Normocephalic.     Nose: Nose normal.     Mouth/Throat:     Mouth: Mucous membranes are moist.  Cardiovascular:     Rate and Rhythm: Normal rate and regular rhythm.     Pulses: Normal pulses.  Pulmonary:     Breath sounds: Wheezing and rhonchi present.  Abdominal:     General: There is no distension.  Musculoskeletal:        General: Normal range of motion.     Cervical back: Normal range of motion.  Skin:    General: Skin is warm.  Neurological:     General: No focal deficit present.     Mental Status: He is alert and oriented to person, place, and time.  Psychiatric:        Mood and Affect: Mood normal.     (all labs ordered are listed, but only abnormal results are displayed) Labs Reviewed  COMPREHENSIVE METABOLIC PANEL WITH GFR - Abnormal; Notable for the following components:      Result Value   Sodium 132 (*)    All other components within normal limits  RESP PANEL  BY RT-PCR (RSV, FLU A&B, COVID)  [id number]  CBC WITH DIFFERENTIAL/PLATELET    EKG: None  Radiology: DG Chest Portable 1 View Result Date: 05/17/2024 CLINICAL DATA:  Cough. EXAM: PORTABLE CHEST 1 VIEW COMPARISON:  01/06/2024. FINDINGS: Tracheostomy cannula is appropriately positioned. Mild cardiomegaly. Mediastinal contours are within normal limits. Left basilar opacity is favored to reflect atelectasis, although, infiltrate can not be entirely excluded. The right lung appears clear. No sizable pleural effusion or pneumothorax. Remote healed left rib and left clavicle fractures. No acute osseous abnormality. IMPRESSION: Left basilar opacity is favored to reflect atelectasis, although,  infiltrate can not be entirely excluded. Electronically Signed   By: Harrietta Sherry M.D.   On: 05/17/2024 14:08     Procedures   Medications Ordered in the ED  magnesium  sulfate IVPB 2 g 50 mL (2 g Intravenous New Bag/Given 05/17/24 1520)  albuterol  (PROVENTIL ) (2.5 MG/3ML) 0.083% nebulizer solution 10 mg (has no administration in time range)  ipratropium-albuterol  (DUONEB) 0.5-2.5 (3) MG/3ML nebulizer solution 3 mL (3 mLs Nebulization Given 05/17/24 1418)  methylPREDNISolone  sodium succinate (SOLU-MEDROL ) 125 mg/2 mL injection 125 mg (125 mg Intravenous Given 05/17/24 1516)                                    Medical Decision Making Amount and/or Complexity of Data Reviewed Labs: ordered. Decision-making details documented in ED Course. Radiology: ordered and independent interpretation performed. Decision-making details documented in ED Course.    Details: Chest xray  atelectasis  Risk Prescription drug management. Risk Details: Pt given solumedrol,albuterol  neb and magnesium . Pt's care turned over to Lavanda Lesches [facility]        Final diagnoses:  COPD exacerbation [facility])    ED Discharge Orders     None          Flint Sonny POUR, DEVONNA 05/17/24 1552    Ruthe Cornet, DO 05/18/24 [phone]

## 2024-05-17 NOTE — Telephone Encounter (Signed)
 FYI Only or Action Required?: FYI only for provider.  Patient is followed in Pulmonology for asthma, last seen on 01/19/2024 by Malachy Comer GAILS, NP.  Called Nurse Triage reporting Shortness of Breath.  Symptoms began several weeks ago.  Interventions attempted: Other: Breathing treatments.  Triage Disposition: Go to ED Now (Notify PCP)  Patient/caregiver understands and will follow disposition?: Yes               Copied from CRM [id number]. Topic: Clinical - Red Word Triage >> May 17, 2024 11:53 AM Nathanel DEL wrote: Red Word that prompted transfer to Nurse Triage: pt has been using breathing treatments, and inhalers, and breathing not getting much better.  Pt concerned Reason for Disposition  [1] MODERATE difficulty breathing (e.g., speaks in phrases, SOB even at rest, pulse 100-120) AND [2] NEW-onset or WORSE than normal  Answer Assessment - Initial Assessment Questions RESPIRATORY STATUS: Describe your breathing? (e.g., wheezing, shortness of breath, unable to speak, severe coughing)      Wheezing, coughing  ONSET: When did this breathing problem begin?      Last 2-3 weeks; using breathing treatments and not working  PATTERN Does the difficult breathing come and go, or has it been constant since it started?      Does okay for 5-10 minutes and then happens again; has not happened before  CAUSE: What do you think is causing the breathing problem?      Not sure  OTHER SYMPTOMS: Do you have any other symptoms? (e.g., chest pain, cough, dizziness, fever, runny nose)     Denies chest pain or dizziness  O2 SATURATION MONITOR:  Do you use an oxygen  saturation monitor (pulse oximeter) at home? If Yes, ask: What is your reading (oxygen  level) today? What is your usual oxygen  saturation reading? (e.g., 95%)       Not able to check; no oxygen  at home  Protocols used: Breathing Difficulty-A-AH

## 2024-05-17 NOTE — ED Notes (Signed)
 RT at bedside.

## 2024-05-17 NOTE — ED Provider Notes (Signed)
 Trached patient  recent pneumonias (3 weeks ago). Here isth increaed secretions and congestion. Wheaing in all lobves. Cxr shows ? Atelectasis. No WBC. No fever. Pt concerned he may have pneumonia again.  Getting tx + neb Physical Exam  BP 131/82 (BP Location: Right Arm)   Pulse 87   Temp 98.1 F (36.7 C) (Oral)   Resp (!) 22   Ht 5' 5 (1.651 m)   Wt 71.2 kg   SpO2 100%   BMI 26.13 kg/m   Physical Exam  Procedures  Procedures  ED Course / MDM   Clinical Course as of 05/17/24 1730  Wed May 17, 2024  1728 Patient reevaluated at bedside.  He states I feel 100% better.  He has no wheezing in his lungs at all and seems to be breathing very well.  Suspect COPD exacerbation.  Patient feels comfortable being discharged at this time we will send him home with a course of steroids for his lungs.  I reviewed and evaluated the current chest x-ray which shows left sided atelectasis.  I reviewed patient's previous outpatient diagnosis of pneumonia which seems to show the same thing.  I do not think that this represents pneumonia.  He appears appropriate for discharge at this time is afebrile without elevated white blood cell count and normal oxygen  saturations. [AH]    Clinical Course User Index [AH] Arloa Chroman, PA-C   Medical Decision Making Amount and/or Complexity of Data Reviewed Labs: ordered. Radiology: ordered.  Risk Prescription drug management.          Arloa Chroman, PA-C 05/17/24 1730    Neysa Caron PARAS, DO 05/17/24 [phone]

## 2024-05-17 NOTE — Telephone Encounter (Signed)
 FYI pt to ED

## 2024-05-17 NOTE — ED Triage Notes (Signed)
 Pt BIB [facility] from home d/t  difficulty clearing secretions from throat. Pt was dx w/ pneumonia 3 weeks ago taking abx w/ no improvement Pt does have a trach ems  suctioned 2x (thick secretions) pt was able to self clear 3x. PIV 18g left ac.  Ems reports bronchitis upper lobes, wheezing, 98% on 10 liters. Hx of hypertension, copd, asthma.   126/82 104 hr 91% RA  NSR No chest pain

## 2024-05-29 DIAGNOSIS — J449 Chronic obstructive pulmonary disease, unspecified: Secondary | ICD-10-CM | POA: Diagnosis not present

## 2024-05-29 DIAGNOSIS — J4530 Mild persistent asthma, uncomplicated: Secondary | ICD-10-CM | POA: Diagnosis not present

## 2024-06-16 ENCOUNTER — Ambulatory Visit: Payer: Self-pay | Admitting: Nurse Practitioner

## 2024-06-16 NOTE — Telephone Encounter (Signed)
 FYI Only or Action Required?: Action required by provider: Medication Request.  Patient is followed in Pulmonology for COPD and asthma, last seen on 01/19/2024 by Malachy Comer GAILS, NP.  Called Nurse Triage reporting Shortness of Breath.  Symptoms began several weeks ago.  Interventions attempted: Rescue inhaler and Nebulizer treatments.  Symptoms are: unchanged.  Triage Disposition: See PCP When Office is Open (Within 3 Days)  Patient/caregiver understands and will follow disposition?: No, wishes to speak with PCP      Copied from CRM #[id number]. Topic: Clinical - Red Word Triage >> Jun 16, 2024  2:58 PM Leila C wrote: Red Word that prompted transfer to Nurse Triage: Patient [id number] states sees NP, Cobb is asking for a Prednisone , for the last two weeks having trouble breathing, wheezing, and coughing mucous white and thick. Patient denies dizziness, pain, nor fever. Please advise.   [id number] [place], [place] - [address] AT [facility] OF [address] [place] [address] Phone: [phone] Fax: [phone]       Reason for Disposition  [1] MODERATE longstanding difficulty breathing (e.g., speaks in phrases, SOB even at rest, pulse 100-120) AND [2] SAME as normal  Answer Assessment - Initial Assessment Questions Patient was treated with Promethazine  syrup while at the ED last month. He would like a prescription for Prednisone  and possibly a similar cough syrup to help until his upcoming appointment on 10/14. ED and UC precautions discussed for the weekend. Please advise.      1. RESPIRATORY STATUS: Describe your breathing? (e.g., wheezing, shortness of breath, unable to speak, severe coughing)      Shortness of breath  2. ONSET: When did this breathing problem begin?      A couple of weeks 3. PATTERN Does the difficult breathing come and go, or has it been constant since it started?      Intermittent  4. SEVERITY:  How bad is your breathing? (e.g., mild, moderate, severe)      Moderate  5. RECURRENT SYMPTOM: Have you had difficulty breathing before? If Yes, ask: When was the last time? and What happened that time?      Yes, history of COPD  6. CARDIAC HISTORY: Do you have any history of heart disease? (e.g., heart attack, angina, bypass surgery, angioplasty)      Yes 7. LUNG HISTORY: Do you have any history of lung disease?  (e.g., pulmonary embolus, asthma, emphysema)     Yes 8. CAUSE: What do you think is causing the breathing problem?      History of COPD and asthma  9. OTHER SYMPTOMS: Do you have any other symptoms? (e.g., chest pain, cough, dizziness, fever, runny nose)     Cough with white sputum  10. O2 SATURATION MONITOR:  Do you use an oxygen  saturation monitor (pulse oximeter) at home? If Yes, ask: What is your reading (oxygen  level) today? What is your usual oxygen  saturation reading? (e.g., 95%)       Says it's been good  Protocols used: Breathing Difficulty-A-AH

## 2024-06-17 ENCOUNTER — Inpatient Hospital Stay (HOSPITAL_COMMUNITY)
Admission: EM | Admit: 2024-06-17 | Discharge: 2024-06-23 | DRG: 208 | Disposition: A | Discharge status: Home / Self Care | Attending: Internal Medicine | Admitting: Internal Medicine

## 2024-06-17 ENCOUNTER — Other Ambulatory Visit: Payer: Self-pay

## 2024-06-17 ENCOUNTER — Emergency Department (HOSPITAL_COMMUNITY)

## 2024-06-17 ENCOUNTER — Encounter (HOSPITAL_COMMUNITY): Payer: Self-pay | Admitting: *Deleted

## 2024-06-17 DIAGNOSIS — J206 Acute bronchitis due to rhinovirus: Secondary | ICD-10-CM | POA: Diagnosis present

## 2024-06-17 DIAGNOSIS — Z79899 Other long term (current) drug therapy: Secondary | ICD-10-CM | POA: Diagnosis not present

## 2024-06-17 DIAGNOSIS — K219 Gastro-esophageal reflux disease without esophagitis: Secondary | ICD-10-CM | POA: Diagnosis present

## 2024-06-17 DIAGNOSIS — Z7951 Long term (current) use of inhaled steroids: Secondary | ICD-10-CM

## 2024-06-17 DIAGNOSIS — N400 Enlarged prostate without lower urinary tract symptoms: Secondary | ICD-10-CM | POA: Diagnosis present

## 2024-06-17 DIAGNOSIS — Z1152 Encounter for screening for COVID-19: Secondary | ICD-10-CM

## 2024-06-17 DIAGNOSIS — J3800 Paralysis of vocal cords and larynx, unspecified: Secondary | ICD-10-CM | POA: Diagnosis present

## 2024-06-17 DIAGNOSIS — J470 Bronchiectasis with acute lower respiratory infection: Secondary | ICD-10-CM | POA: Diagnosis present

## 2024-06-17 DIAGNOSIS — Z930 Tracheostomy status: Secondary | ICD-10-CM | POA: Diagnosis not present

## 2024-06-17 DIAGNOSIS — J9811 Atelectasis: Secondary | ICD-10-CM | POA: Diagnosis present

## 2024-06-17 DIAGNOSIS — J9601 Acute respiratory failure with hypoxia: Secondary | ICD-10-CM | POA: Diagnosis present

## 2024-06-17 DIAGNOSIS — J440 Chronic obstructive pulmonary disease with acute lower respiratory infection: Secondary | ICD-10-CM | POA: Diagnosis present

## 2024-06-17 DIAGNOSIS — J3802 Paralysis of vocal cords and larynx, bilateral: Secondary | ICD-10-CM | POA: Diagnosis not present

## 2024-06-17 DIAGNOSIS — R062 Wheezing: Secondary | ICD-10-CM

## 2024-06-17 DIAGNOSIS — B348 Other viral infections of unspecified site: Secondary | ICD-10-CM

## 2024-06-17 DIAGNOSIS — J9503 Malfunction of tracheostomy stoma: Secondary | ICD-10-CM | POA: Diagnosis present

## 2024-06-17 DIAGNOSIS — F419 Anxiety disorder, unspecified: Secondary | ICD-10-CM | POA: Diagnosis present

## 2024-06-17 DIAGNOSIS — I10 Essential (primary) hypertension: Secondary | ICD-10-CM | POA: Diagnosis present

## 2024-06-17 DIAGNOSIS — J9621 Acute and chronic respiratory failure with hypoxia: Secondary | ICD-10-CM | POA: Diagnosis present

## 2024-06-17 DIAGNOSIS — R0603 Acute respiratory distress: Secondary | ICD-10-CM | POA: Diagnosis not present

## 2024-06-17 DIAGNOSIS — R9389 Abnormal findings on diagnostic imaging of other specified body structures: Secondary | ICD-10-CM

## 2024-06-17 DIAGNOSIS — J471 Bronchiectasis with (acute) exacerbation: Secondary | ICD-10-CM | POA: Diagnosis not present

## 2024-06-17 DIAGNOSIS — J441 Chronic obstructive pulmonary disease with (acute) exacerbation: Secondary | ICD-10-CM | POA: Diagnosis not present

## 2024-06-17 DIAGNOSIS — J9801 Acute bronchospasm: Secondary | ICD-10-CM | POA: Diagnosis not present

## 2024-06-17 DIAGNOSIS — J398 Other specified diseases of upper respiratory tract: Secondary | ICD-10-CM | POA: Diagnosis not present

## 2024-06-17 LAB — RESPIRATORY PANEL BY PCR

## 2024-06-17 LAB — CBC WITH DIFFERENTIAL/PLATELET
Abs Immature Granulocytes: 0.05 K/uL (ref 0.00–0.07)
Basophils Absolute: 0.1 K/uL (ref 0.0–0.1)
Basophils Relative: 0 %
Eosinophils Absolute: 0.6 K/uL — ABNORMAL HIGH (ref 0.0–0.5)
Eosinophils Relative: 6 %
HCT: 38.4 % — ABNORMAL LOW (ref 39.0–52.0)
Hemoglobin: 13.1 g/dL (ref 13.0–17.0)
Immature Granulocytes: 0 %
Lymphocytes Relative: 15 %
Lymphs Abs: 1.7 K/uL (ref 0.7–4.0)
MCH: 31.8 pg (ref 26.0–34.0)
MCHC: 34.1 g/dL (ref 30.0–36.0)
MCV: 93.2 fL (ref 80.0–100.0)
Monocytes Absolute: 0.9 K/uL (ref 0.1–1.0)
Monocytes Relative: 8 %
Neutro Abs: 7.9 K/uL — ABNORMAL HIGH (ref 1.7–7.7)
Neutrophils Relative %: 71 %
Platelets: 249 K/uL (ref 150–400)
RBC: 4.12 MIL/uL — ABNORMAL LOW (ref 4.22–5.81)
RDW: 13.4 % (ref 11.5–15.5)
WBC: 11.2 K/uL — ABNORMAL HIGH (ref 4.0–10.5)
nRBC: 0 % (ref 0.0–0.2)

## 2024-06-17 LAB — COMPREHENSIVE METABOLIC PANEL WITH GFR
ALT: 24 U/L (ref 0–44)
AST: 26 U/L (ref 15–41)
Albumin: 3.6 g/dL (ref 3.5–5.0)
Alkaline Phosphatase: 62 U/L (ref 38–126)
Anion gap: 13 (ref 5–15)
BUN: 10 mg/dL (ref 8–23)
CO2: 20 mmol/L — ABNORMAL LOW (ref 22–32)
Calcium: 8.5 mg/dL — ABNORMAL LOW (ref 8.9–10.3)
Chloride: 100 mmol/L (ref 98–111)
Creatinine, Ser: 0.76 mg/dL (ref 0.61–1.24)
GFR, Estimated: >60 mL/min (ref >60)
Glucose, Bld: 164 mg/dL — ABNORMAL HIGH (ref 70–99)
Potassium: 3.8 mmol/L (ref 3.5–5.1)
Sodium: 133 mmol/L — ABNORMAL LOW (ref 135–145)
Total Bilirubin: 0.4 mg/dL (ref 0.0–1.2)
Total Protein: 6.8 g/dL (ref 6.5–8.1)

## 2024-06-17 LAB — I-STAT ARTERIAL BLOOD GAS, ED
Acid-base deficit: 2 mmol/L (ref 0.0–2.0)
Bicarbonate: 24.3 mmol/L (ref 20.0–28.0)
Calcium, Ion: 1.2 mmol/L (ref 1.15–1.40)
HCT: 40 % (ref 39.0–52.0)
Hemoglobin: 13.6 g/dL (ref 13.0–17.0)
O2 Saturation: 98 %
Patient temperature: 97.5
Potassium: 3.3 mmol/L — ABNORMAL LOW (ref 3.5–5.1)
Sodium: 134 mmol/L — ABNORMAL LOW (ref 135–145)
TCO2: 26 mmol/L (ref 22–32)
pCO2 arterial: 46.5 mmHg (ref 32–48)
pH, Arterial: 7.323 — ABNORMAL LOW (ref 7.35–7.45)
pO2, Arterial: 108 mmHg (ref 83–108)

## 2024-06-17 LAB — RESP PANEL BY RT-PCR (RSV, FLU A&B, COVID)  RVPGX2
Influenza A by PCR: NEGATIVE
Influenza B by PCR: NEGATIVE
Resp Syncytial Virus by PCR: NEGATIVE
SARS Coronavirus 2 by RT PCR: NEGATIVE

## 2024-06-17 LAB — MRSA NEXT GEN BY PCR, NASAL: MRSA by PCR Next Gen: NOT DETECTED

## 2024-06-17 LAB — STREP PNEUMONIAE URINARY ANTIGEN: Strep Pneumo Urinary Antigen: NEGATIVE

## 2024-06-17 LAB — HEMOGLOBIN A1C
Hgb A1c MFr Bld: 5.9 % — ABNORMAL HIGH (ref 4.8–5.6)
Mean Plasma Glucose: 122.63 mg/dL

## 2024-06-17 LAB — PROCALCITONIN: Procalcitonin: <0.1 ng/mL

## 2024-06-17 LAB — GLUCOSE, CAPILLARY
Glucose-Capillary: 152 mg/dL — ABNORMAL HIGH (ref 70–99)
Glucose-Capillary: 145 mg/dL — ABNORMAL HIGH (ref 70–99)
Glucose-Capillary: 152 mg/dL — ABNORMAL HIGH (ref 70–99)
Glucose-Capillary: 114 mg/dL — ABNORMAL HIGH (ref 70–99)
Glucose-Capillary: 118 mg/dL — ABNORMAL HIGH (ref 70–99)

## 2024-06-17 MED ORDER — ORAL CARE MOUTH RINSE
15.0000 mL | OROMUCOSAL | Status: DC
  Administered 2024-06-17 – 2024-06-23 (×21): 15 mL via OROMUCOSAL

## 2024-06-17 MED ORDER — KETOROLAC TROMETHAMINE 15 MG/ML IJ SOLN
15.0000 mg | Freq: Once | INTRAMUSCULAR | Status: AC
Start: 2024-06-17 — End: 2024-06-17
  Administered 2024-06-17: 15 mg via INTRAVENOUS
  Filled 2024-06-17: qty 1

## 2024-06-17 MED ORDER — ARFORMOTEROL TARTRATE 15 MCG/2ML IN NEBU
15.0000 ug | INHALATION_SOLUTION | Freq: Two times a day (BID) | RESPIRATORY_TRACT | Status: DC
  Administered 2024-06-17 – 2024-06-23 (×13): 15 ug via RESPIRATORY_TRACT
  Filled 2024-06-17 (×14): qty 2

## 2024-06-17 MED ORDER — FAMOTIDINE 20 MG PO TABS
20.0000 mg | ORAL_TABLET | Freq: Two times a day (BID) | ORAL | Status: DC
Start: 2024-06-17 — End: 2024-06-17

## 2024-06-17 MED ORDER — SODIUM CHLORIDE 0.9 % IV SOLN: INTRAVENOUS | Status: AC

## 2024-06-17 MED ORDER — REVEFENACIN 175 MCG/3ML IN SOLN
175.0000 ug | Freq: Every day | RESPIRATORY_TRACT | Status: DC
  Administered 2024-06-17 – 2024-06-23 (×7): 175 ug via RESPIRATORY_TRACT
  Filled 2024-06-17 (×8): qty 3

## 2024-06-17 MED ORDER — ORAL CARE MOUTH RINSE: 15.0000 mL | OROMUCOSAL | Status: DC | PRN

## 2024-06-17 MED ORDER — PANTOPRAZOLE SODIUM 40 MG IV SOLR
40.0000 mg | Freq: Every day | INTRAVENOUS | Status: DC
  Administered 2024-06-17 – 2024-06-18 (×2): 40 mg via INTRAVENOUS
  Filled 2024-06-17 (×2): qty 10

## 2024-06-17 MED ORDER — MIDAZOLAM HCL 2 MG/2ML IJ SOLN
0.5000 mg | Freq: Once | INTRAMUSCULAR | Status: AC
  Administered 2024-06-17: 0.5 mg via INTRAVENOUS
  Filled 2024-06-17: qty 2

## 2024-06-17 MED ORDER — IPRATROPIUM-ALBUTEROL 0.5-2.5 (3) MG/3ML IN SOLN
3.0000 mL | RESPIRATORY_TRACT | Status: DC
  Administered 2024-06-17 – 2024-06-18 (×7): 3 mL via RESPIRATORY_TRACT
  Filled 2024-06-17 (×7): qty 3

## 2024-06-17 MED ORDER — ONDANSETRON HCL 4 MG/2ML IJ SOLN: 4.0000 mg | Freq: Four times a day (QID) | INTRAMUSCULAR | Status: DC | PRN

## 2024-06-17 MED ORDER — INSULIN ASPART 100 UNIT/ML IJ SOLN
0.0000 [IU] | INTRAMUSCULAR | Status: DC
  Administered 2024-06-17 (×2): 2 [IU] via SUBCUTANEOUS
  Administered 2024-06-17: 1 [IU] via SUBCUTANEOUS
  Administered 2024-06-18: 2 [IU] via SUBCUTANEOUS
  Administered 2024-06-19 – 2024-06-20 (×2): 1 [IU] via SUBCUTANEOUS

## 2024-06-17 MED ORDER — SODIUM CHLORIDE 0.9 % IV SOLN
2.0000 g | Freq: Every day | INTRAVENOUS | Status: DC
  Administered 2024-06-17: 2 g via INTRAVENOUS
  Filled 2024-06-17: qty 20

## 2024-06-17 MED ORDER — HYDROMORPHONE HCL 1 MG/ML IJ SOLN: 0.5000 mg | INTRAMUSCULAR | Status: DC | PRN

## 2024-06-17 MED ORDER — BUDESONIDE 0.25 MG/2ML IN SUSP
0.2500 mg | Freq: Two times a day (BID) | RESPIRATORY_TRACT | Status: DC
  Administered 2024-06-17 – 2024-06-20 (×7): 0.25 mg via RESPIRATORY_TRACT
  Filled 2024-06-17 (×8): qty 2

## 2024-06-17 MED ORDER — SODIUM CHLORIDE 0.9 % IV SOLN
500.0000 mg | INTRAVENOUS | Status: DC
  Administered 2024-06-17: 500 mg via INTRAVENOUS
  Filled 2024-06-17 (×2): qty 5

## 2024-06-17 MED ORDER — FENTANYL CITRATE PF 50 MCG/ML IJ SOSY
25.0000 ug | PREFILLED_SYRINGE | INTRAMUSCULAR | Status: DC | PRN
  Administered 2024-06-17 (×4): 50 ug via INTRAVENOUS
  Filled 2024-06-17: qty 2
  Filled 2024-06-17 (×3): qty 1

## 2024-06-17 MED ORDER — DEXMEDETOMIDINE HCL IN NACL 400 MCG/100ML IV SOLN
0.0000 ug/kg/h | INTRAVENOUS | Status: DC
  Administered 2024-06-17: 0.2 ug/kg/h via INTRAVENOUS
  Administered 2024-06-18: 0.5 ug/kg/h via INTRAVENOUS
  Filled 2024-06-17 (×2): qty 100

## 2024-06-17 MED ORDER — METHYLPREDNISOLONE SODIUM SUCC 125 MG IJ SOLR
125.0000 mg | Freq: Once | INTRAMUSCULAR | Status: AC
  Administered 2024-06-17: 125 mg via INTRAVENOUS
  Filled 2024-06-17: qty 2

## 2024-06-17 MED ORDER — FENTANYL BOLUS VIA INFUSION: 20.0000 ug | INTRAVENOUS | Status: DC | PRN

## 2024-06-17 MED ORDER — CHLORHEXIDINE GLUCONATE CLOTH 2 % EX PADS
6.0000 | MEDICATED_PAD | Freq: Every day | CUTANEOUS | Status: DC
  Administered 2024-06-17 – 2024-06-22 (×6): 6 via TOPICAL

## 2024-06-17 MED ORDER — HEPARIN SODIUM (PORCINE) 5000 UNIT/ML IJ SOLN
5000.0000 [IU] | Freq: Three times a day (TID) | INTRAMUSCULAR | Status: DC
  Administered 2024-06-17 – 2024-06-23 (×18): 5000 [IU] via SUBCUTANEOUS
  Filled 2024-06-17 (×18): qty 1

## 2024-06-17 MED ORDER — PROPOFOL 1000 MG/100ML IV EMUL
0.0000 ug/kg/min | INTRAVENOUS | Status: DC
  Administered 2024-06-17: 20 ug/kg/min via INTRAVENOUS
  Administered 2024-06-17: 5 ug/kg/min via INTRAVENOUS
  Filled 2024-06-17 (×2): qty 100

## 2024-06-17 MED ORDER — ALBUTEROL SULFATE (2.5 MG/3ML) 0.083% IN NEBU
10.0000 mg | INHALATION_SOLUTION | Freq: Once | RESPIRATORY_TRACT | Status: AC
  Administered 2024-06-17: 10 mg via RESPIRATORY_TRACT
  Filled 2024-06-17: qty 12

## 2024-06-17 MED ORDER — METHYLPREDNISOLONE SODIUM SUCC 125 MG IJ SOLR: 80.0000 mg | Freq: Every day | INTRAMUSCULAR | Status: DC

## 2024-06-17 MED ORDER — MAGNESIUM SULFATE 2 GM/50ML IV SOLN
2.0000 g | Freq: Once | INTRAVENOUS | Status: AC
Start: 2024-06-17 — End: 2024-06-17
  Administered 2024-06-17: 2 g via INTRAVENOUS
  Filled 2024-06-17: qty 50

## 2024-06-17 NOTE — Progress Notes (Signed)
 Patient taken off the vent at this time and place on 6L 28% ATC, tolerating well, vitals stable. Vent on standby.
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
## 2024-06-17 NOTE — ED Notes (Signed)
 RN attempted report. Pt isn't assigned a room at this time.
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
## 2024-06-17 NOTE — ED Notes (Signed)
 RT notified pt ventilator alerting expiratory volume low. RT en route   Pt laying comfortably in bed and denies any pain at this time.
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
## 2024-06-17 NOTE — ED Notes (Signed)
 Transportation delayed d/t RT delay

## 2024-06-17 NOTE — Plan of Care (Signed)
  Problem: Coping: Goal: Ability to adjust to condition or change in health will improve Outcome: Progressing   Problem: Fluid Volume: Goal: Ability to maintain a balanced intake and output will improve Outcome: Progressing   Problem: Metabolic: Goal: Ability to maintain appropriate glucose levels will improve Outcome: Progressing   Problem: Skin Integrity: Goal: Risk for impaired skin integrity will decrease Outcome: Progressing   Problem: Tissue Perfusion: Goal: Adequacy of tissue perfusion will improve Outcome: Progressing   Problem: Education: Goal: Knowledge of General Education information will improve Description: Including pain rating scale, medication(s)/side effects and non-pharmacologic comfort measures Outcome: Progressing   Problem: Health Behavior/Discharge Planning: Goal: Ability to manage health-related needs will improve Outcome: Progressing   Problem: Clinical Measurements: Goal: Ability to maintain clinical measurements within normal limits will improve Outcome: Progressing Goal: Diagnostic test results will improve Outcome: Progressing Goal: Respiratory complications will improve Outcome: Progressing Goal: Cardiovascular complication will be avoided Outcome: Progressing   Problem: Activity: Goal: Risk for activity intolerance will decrease Outcome: Progressing   Problem: Coping: Goal: Level of anxiety will decrease Outcome: Progressing

## 2024-06-17 NOTE — Progress Notes (Signed)
 Pt. Came in respiratory distress. Pt. Had a #6 cuffless trache upon arrival. MD gave RT permission to change out pt. Trache to a #6 cuffed trache. Pt. Trache changed successfully and pt .placed on vent with no issues.

## 2024-06-17 NOTE — Progress Notes (Signed)
 Patient transported from ED to room 2H22 on the ventilator with no problems.

## 2024-06-17 NOTE — ED Notes (Signed)
 Pt had a BM. Two RN cut underwear off and placed in trash bin. Pt pants and wallet was placed in pt belongings bag.   Linen changed, brief applied, pericare, and condom catheter.

## 2024-06-17 NOTE — Progress Notes (Deleted)
 Admitting RN busy with patient care, charge RN called ED for report. ED RN busy with patient care at this time. Charge RN name and number given to ED secretary for callback when ED RN available to give report.
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
## 2024-06-17 NOTE — ED Notes (Signed)
 Pt applied fall risk armband and slip resistant socks

## 2024-06-17 NOTE — ED Notes (Signed)
 Pt shoes placed in pt belonging back

## 2024-06-18 DIAGNOSIS — Z930 Tracheostomy status: Secondary | ICD-10-CM

## 2024-06-18 DIAGNOSIS — J9811 Atelectasis: Secondary | ICD-10-CM

## 2024-06-18 DIAGNOSIS — J441 Chronic obstructive pulmonary disease with (acute) exacerbation: Secondary | ICD-10-CM | POA: Diagnosis not present

## 2024-06-18 LAB — GLUCOSE, CAPILLARY
Glucose-Capillary: 104 mg/dL — ABNORMAL HIGH (ref 70–99)
Glucose-Capillary: 104 mg/dL — ABNORMAL HIGH (ref 70–99)
Glucose-Capillary: 111 mg/dL — ABNORMAL HIGH (ref 70–99)
Glucose-Capillary: 116 mg/dL — ABNORMAL HIGH (ref 70–99)
Glucose-Capillary: 116 mg/dL — ABNORMAL HIGH (ref 70–99)
Glucose-Capillary: 155 mg/dL — ABNORMAL HIGH (ref 70–99)
Glucose-Capillary: 99 mg/dL (ref 70–99)

## 2024-06-18 LAB — BASIC METABOLIC PANEL WITH GFR
Anion gap: 11 (ref 5–15)
BUN: 10 mg/dL (ref 8–23)
CO2: 23 mmol/L (ref 22–32)
Calcium: 8.6 mg/dL — ABNORMAL LOW (ref 8.9–10.3)
Chloride: 103 mmol/L (ref 98–111)
Creatinine, Ser: 0.7 mg/dL (ref 0.61–1.24)
GFR, Estimated: >60 mL/min (ref >60)
Glucose, Bld: 103 mg/dL — ABNORMAL HIGH (ref 70–99)
Potassium: 4.2 mmol/L (ref 3.5–5.1)
Sodium: 137 mmol/L (ref 135–145)

## 2024-06-18 LAB — CBC
HCT: 35.9 % — ABNORMAL LOW (ref 39.0–52.0)
Hemoglobin: 12.3 g/dL — ABNORMAL LOW (ref 13.0–17.0)
MCH: 31.5 pg (ref 26.0–34.0)
MCHC: 34.3 g/dL (ref 30.0–36.0)
MCV: 91.8 fL (ref 80.0–100.0)
Platelets: 252 K/uL (ref 150–400)
RBC: 3.91 MIL/uL — ABNORMAL LOW (ref 4.22–5.81)
RDW: 13.7 % (ref 11.5–15.5)
WBC: 9.6 K/uL (ref 4.0–10.5)
nRBC: 0 % (ref 0.0–0.2)

## 2024-06-18 LAB — MAGNESIUM: Magnesium: 2.4 mg/dL (ref 1.7–2.4)

## 2024-06-18 MED ORDER — TERAZOSIN HCL 5 MG PO CAPS
5.0000 mg | ORAL_CAPSULE | Freq: Every day | ORAL | Status: DC
  Administered 2024-06-18 – 2024-06-22 (×5): 5 mg via ORAL
  Filled 2024-06-18 (×6): qty 1

## 2024-06-18 MED ORDER — ALBUTEROL SULFATE (2.5 MG/3ML) 0.083% IN NEBU
2.5000 mg | INHALATION_SOLUTION | Freq: Four times a day (QID) | RESPIRATORY_TRACT | Status: DC | PRN
  Administered 2024-06-18 – 2024-06-21 (×6): 2.5 mg via RESPIRATORY_TRACT
  Filled 2024-06-18 (×8): qty 3

## 2024-06-18 MED ORDER — LIDOCAINE HCL (PF) 2% IJ FOR NEBU
5.0000 mL | Freq: Once | RESPIRATORY_TRACT | Status: AC
  Administered 2024-06-18: 5 mL via RESPIRATORY_TRACT
  Filled 2024-06-18: qty 5

## 2024-06-18 MED ORDER — ACETAMINOPHEN 325 MG PO TABS
650.0000 mg | ORAL_TABLET | Freq: Four times a day (QID) | ORAL | Status: DC | PRN
  Administered 2024-06-18 – 2024-06-23 (×6): 650 mg via ORAL
  Filled 2024-06-18 (×6): qty 2

## 2024-06-18 MED ORDER — PREDNISONE 20 MG PO TABS
40.0000 mg | ORAL_TABLET | Freq: Every day | ORAL | Status: DC
  Administered 2024-06-18 – 2024-06-19 (×2): 40 mg via ORAL
  Filled 2024-06-18 (×2): qty 2

## 2024-06-18 MED ORDER — ROSUVASTATIN CALCIUM 5 MG PO TABS
5.0000 mg | ORAL_TABLET | Freq: Every day | ORAL | Status: DC
  Administered 2024-06-18 – 2024-06-23 (×6): 5 mg via ORAL
  Filled 2024-06-18 (×6): qty 1

## 2024-06-18 MED ORDER — SENNA 8.6 MG PO TABS
1.0000 | ORAL_TABLET | Freq: Every day | ORAL | Status: DC | PRN
  Administered 2024-06-18 – 2024-06-22 (×3): 8.6 mg via ORAL
  Filled 2024-06-18 (×4): qty 1

## 2024-06-18 MED ORDER — GUAIFENESIN 100 MG/5ML PO LIQD
5.0000 mL | ORAL | Status: DC | PRN
  Administered 2024-06-18 – 2024-06-21 (×6): 5 mL via ORAL
  Filled 2024-06-18: qty 10
  Filled 2024-06-18 (×3): qty 5
  Filled 2024-06-18: qty 10
  Filled 2024-06-18: qty 5

## 2024-06-18 MED ORDER — ASPIRIN 81 MG PO TBEC
81.0000 mg | DELAYED_RELEASE_TABLET | Freq: Every morning | ORAL | Status: DC
  Administered 2024-06-18 – 2024-06-23 (×6): 81 mg via ORAL
  Filled 2024-06-18 (×6): qty 1
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
## 2024-06-18 NOTE — Progress Notes (Signed)
 eLink Physician-Brief Progress Note Patient Name: [name] [name] DOB: 1950/11/24 MRN: [id number]   Date of Service  06/18/2024  HPI/Events of Note  Patient on chronic tracheostomy with well-established stoma who initially presented with acute COPD exacerbation requiring mechanical ventilation but has been off ventilation for greater than 12 hours.  He has a chronic 6.0 cuffless Shiley in place but was switched to a cuffed 6.0 Shiley during his period of mechanical ventilation.  The tracheostomy keeps getting tugged at with normal activity leading to cough and anxiety.  He has minimal secretions through the tracheostomy.  Continues to have wheezing but tolerating blow-by oxygen  without distress when tracheostomy is in appropriate position  eICU Interventions  Given recurrent issues with tracheostomy displacement and lack of mechanical ventilation at this time, recommend switching back to cuffless 6.0 Shiley  If he needs mechanical ventilation again, will consider placement of an 8.0 Shiley cuffed to better fit his stoma.     Intervention Category Intermediate Interventions: Respiratory distress - evaluation and management  Lanny Lipkin 06/18/2024, 3:36 AM

## 2024-06-18 NOTE — Progress Notes (Signed)
 Patients trach changed from a #6 Shiley cuffed to a #6 Shiley uncuffed per MD's order with RT x2 at bedside. Trach placement confirmed with positive color changed CO2 detector. Patient tolerating well, vitals stable.

## 2024-06-18 NOTE — Progress Notes (Signed)
 Pt. Continues to have coughing spells. It's believed to be from the cuffed trach. The pt. Is a chronic trach pt.  That came in originally for copd exacerbation.  Perhaps change the trach to cuff less, just a suggestion.  Will continue to monitor. The nurse and resp. re aware and on the same page.  Video conference done

## 2024-06-18 NOTE — Plan of Care (Signed)
  Problem: Coping: Goal: Ability to adjust to condition or change in health will improve Outcome: Progressing   Problem: Fluid Volume: Goal: Ability to maintain a balanced intake and output will improve Outcome: Progressing   Problem: Health Behavior/Discharge Planning: Goal: Ability to identify and utilize available resources and services will improve Outcome: Progressing   Problem: Nutritional: Goal: Progress toward achieving an optimal weight will improve Outcome: Progressing   Problem: Skin Integrity: Goal: Risk for impaired skin integrity will decrease Outcome: Progressing   Problem: Tissue Perfusion: Goal: Adequacy of tissue perfusion will improve Outcome: Progressing   Problem: Education: Goal: Knowledge of General Education information will improve Description: Including pain rating scale, medication(s)/side effects and non-pharmacologic comfort measures Outcome: Progressing

## 2024-06-19 ENCOUNTER — Inpatient Hospital Stay (HOSPITAL_COMMUNITY)

## 2024-06-19 DIAGNOSIS — J441 Chronic obstructive pulmonary disease with (acute) exacerbation: Secondary | ICD-10-CM

## 2024-06-19 DIAGNOSIS — R9389 Abnormal findings on diagnostic imaging of other specified body structures: Secondary | ICD-10-CM

## 2024-06-19 DIAGNOSIS — Z930 Tracheostomy status: Secondary | ICD-10-CM

## 2024-06-19 DIAGNOSIS — J398 Other specified diseases of upper respiratory tract: Secondary | ICD-10-CM

## 2024-06-19 DIAGNOSIS — J9801 Acute bronchospasm: Secondary | ICD-10-CM

## 2024-06-19 DIAGNOSIS — R062 Wheezing: Secondary | ICD-10-CM

## 2024-06-19 DIAGNOSIS — J3802 Paralysis of vocal cords and larynx, bilateral: Secondary | ICD-10-CM

## 2024-06-19 DIAGNOSIS — J206 Acute bronchitis due to rhinovirus: Principal | ICD-10-CM

## 2024-06-19 DIAGNOSIS — B348 Other viral infections of unspecified site: Secondary | ICD-10-CM

## 2024-06-19 DIAGNOSIS — F419 Anxiety disorder, unspecified: Secondary | ICD-10-CM | POA: Diagnosis not present

## 2024-06-19 LAB — GLUCOSE, CAPILLARY
Glucose-Capillary: 96 mg/dL (ref 70–99)
Glucose-Capillary: 105 mg/dL — ABNORMAL HIGH (ref 70–99)
Glucose-Capillary: 106 mg/dL — ABNORMAL HIGH (ref 70–99)
Glucose-Capillary: 119 mg/dL — ABNORMAL HIGH (ref 70–99)
Glucose-Capillary: 127 mg/dL — ABNORMAL HIGH (ref 70–99)

## 2024-06-19 MED ORDER — ALPRAZOLAM 0.5 MG PO TABS
0.5000 mg | ORAL_TABLET | Freq: Two times a day (BID) | ORAL | Status: DC | PRN
  Administered 2024-06-20 (×2): 0.5 mg via ORAL
  Filled 2024-06-19 (×2): qty 1

## 2024-06-19 MED ORDER — FINASTERIDE 5 MG PO TABS
5.0000 mg | ORAL_TABLET | Freq: Every day | ORAL | Status: DC
  Administered 2024-06-19 – 2024-06-23 (×5): 5 mg via ORAL
  Filled 2024-06-19 (×5): qty 1

## 2024-06-19 MED ORDER — TRAMADOL HCL 50 MG PO TABS
50.0000 mg | ORAL_TABLET | Freq: Two times a day (BID) | ORAL | Status: DC | PRN
  Administered 2024-06-19: 50 mg via ORAL
  Filled 2024-06-19: qty 1

## 2024-06-19 MED ORDER — PREDNISONE 20 MG PO TABS
40.0000 mg | ORAL_TABLET | Freq: Two times a day (BID) | ORAL | Status: DC
  Administered 2024-06-19 – 2024-06-20 (×2): 40 mg via ORAL
  Filled 2024-06-19 (×3): qty 2

## 2024-06-19 MED ORDER — IPRATROPIUM-ALBUTEROL 0.5-2.5 (3) MG/3ML IN SOLN
3.0000 mL | RESPIRATORY_TRACT | Status: DC
  Administered 2024-06-19 – 2024-06-23 (×22): 3 mL via RESPIRATORY_TRACT
  Filled 2024-06-19 (×22): qty 3

## 2024-06-19 MED ORDER — PANTOPRAZOLE SODIUM 40 MG PO TBEC
40.0000 mg | DELAYED_RELEASE_TABLET | Freq: Every day | ORAL | Status: DC
  Administered 2024-06-19 – 2024-06-23 (×5): 40 mg via ORAL
  Filled 2024-06-19 (×5): qty 1

## 2024-06-19 MED ORDER — MAGNESIUM SULFATE 2 GM/50ML IV SOLN
2.0000 g | Freq: Once | INTRAVENOUS | Status: AC
  Administered 2024-06-19: 2 g via INTRAVENOUS
  Filled 2024-06-19: qty 50

## 2024-06-19 MED ORDER — ALBUTEROL (5 MG/ML) CONTINUOUS INHALATION SOLN
10.0000 mg/h | INHALATION_SOLUTION | RESPIRATORY_TRACT | Status: DC
  Administered 2024-06-19: 10 mg/h via RESPIRATORY_TRACT
  Filled 2024-06-19 (×2): qty 20
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
## 2024-06-19 NOTE — Evaluation (Signed)
 Physical Therapy Evaluation [name] MRN: [id number] DOB: Aug 20, 1951 Today's Date: 06/19/2024  History of Present Illness  73 y/o M presentign to ED on 10/4 with SOB and COPD exacerbation. Pt was changed to #6 cuffed trachand placed on vent.    PMH includes COPD, anxiety, chronic trach #6 cuffless.  Clinical Impression  Pt admitted with above diagnosis. Previously independent, living in apartment with 3 flights of stairs to navigate (elevator has been broken for 3 weeks.) Pt required supervision for safety to ambulate up to 85 feet max today. SpO2 did drop to 85% on RA towards end of distance, HR to 99, RR 25 with moderate DOE.) Vitals quickly rebound <1 minute with seated rest and 6L applied to trach collar. Pt greatest functional challenge will be navigating multiple flights of stairs to get into his apartment upon d/c. Will continue to progress during acute admission as tolerated.  Pt currently with functional limitations due to the deficits listed below (see PT Problem List). Pt will benefit from acute skilled PT to increase their independence and safety with mobility to allow discharge.           If plan is discharge home, recommend the following: Assist for transportation;Help with stairs or ramp for entrance   Can travel by private vehicle        Equipment Recommendations None recommended by PT  Recommendations for Other Services       Functional Status Assessment Patient has had a recent decline in their functional status and demonstrates the ability to make significant improvements in function in a reasonable and predictable amount of time.     Precautions / Restrictions Precautions Precautions: Fall Recall of Precautions/Restrictions: Intact Precaution/Restrictions Comments: chronic trach, PMV, watch O2/RR Restrictions Weight Bearing Restrictions Per Provider Order: No      Mobility  Bed Mobility Overal bed mobility: Independent                   Transfers Overall transfer level: Needs assistance Equipment used: None Transfers: Sit to/from Stand Sit to Stand: Supervision           General transfer comment: Performed with supervision for safety, no physical assist. Mild sway but self corrects.    Ambulation/Gait Ambulation/Gait assistance: Supervision Gait Distance (Feet): 85 Feet Assistive device: None Gait Pattern/deviations: Step-through pattern, Decreased stride length Gait velocity: dec Gait velocity interpretation: <1.31 ft/sec, indicative of household ambulator   General Gait Details: Mild instability but no physical assist required. No overt LOB or buckling. Required 2 short standing rest breaks. RR to 25, SpO2 down to 85 at end of distance on room air. Cues for energy conservation, symptom awareness.  Stairs            Wheelchair Mobility     Tilt Bed    Modified Rankin (Stroke Patients Only)       Balance Overall balance assessment: Mild deficits observed, not formally tested                                           Pertinent Vitals/Pain Pain Assessment Pain Assessment: Faces Faces Pain Scale: Hurts little more Pain Location: ribs with coughing Pain Descriptors / Indicators: Aching Pain Intervention(s): Monitored during session, Limited activity within patient's tolerance, Repositioned    Home Living Family/patient expects to be discharged to:: Private residence Living Arrangements: Alone Available Help at Discharge:  Family;Available PRN/intermittently (GF and kids live nearby) Type of Home: Apartment Home Access: Elevator;Other (comment) Advertising account executive broken (3 flights of stairs to apt.))       Home Layout: One level Home Equipment: Shower seat;Cane - single point;Rolling Walker (2 wheels)      Prior Function Prior Level of Function : Independent/Modified Independent;History of Falls (last six months)             Mobility Comments: ind no device,  falls x2 ADLs Comments: ind     Extremity/Trunk Assessment   Upper Extremity Assessment Upper Extremity Assessment: Defer to OT evaluation    Lower Extremity Assessment Lower Extremity Assessment: Generalized weakness    Cervical / Trunk Assessment Cervical / Trunk Assessment: Normal  Communication   Communication Communication: Impaired Factors Affecting Communication: Trach/intubated (SOB, intermittently donning/doffing PMV)    Cognition Arousal: Alert Behavior During Therapy: WFL for tasks assessed/performed   PT - Cognitive impairments: No apparent impairments                         Following commands: Intact       Cueing Cueing Techniques: Verbal cues     General Comments General comments (skin integrity, edema, etc.): SpO2 91% on 6L trach collar, 85% ambulating on RA. HR to 99 with gait.. Recovers to >90% in less than upon sitting with O2 applied.    Exercises     Assessment/Plan    PT Assessment Patient needs continued PT services  PT Problem List Decreased strength;Decreased activity tolerance;Decreased balance;Decreased mobility;Decreased knowledge of use of DME;Cardiopulmonary status limiting activity       PT Treatment Interventions DME instruction;Gait training;Stair training;Functional mobility training;Therapeutic activities;Therapeutic exercise;Balance training;Neuromuscular re-education;Patient/family education    PT Goals (Current goals can be found in the Care Plan section)  Acute Rehab PT Goals Patient Stated Goal: Get well PT Goal Formulation: With patient Time For Goal Achievement: 07/03/24 Potential to Achieve Goals: Good Additional Goals Additional Goal #1: Will demonstrate functional capacity to navigate 3 flights of stairs to enter his apartment, at a mod I level. Rails and equipment as needed.    Frequency Min 2X/week     Co-evaluation PT/OT/SLP Co-Evaluation/Treatment: Yes Reason for Co-Treatment: For  patient/therapist safety;To address functional/ADL transfers PT goals addressed during session: Mobility/safety with mobility;Balance         AM-PAC PT 6 Clicks Mobility  Outcome Measure Help needed turning from your back to your side while in a flat bed without using bedrails?: None Help needed moving from lying on your back to sitting on the side of a flat bed without using bedrails?: None Help needed moving to and from a bed to a chair (including a wheelchair)?: A Little Help needed standing up from a chair using your arms (e.g., wheelchair or bedside chair)?: A Little Help needed to walk in hospital room?: A Little Help needed climbing 3-5 steps with a railing? : A Little 6 Click Score: 20    End of Session Equipment Utilized During Treatment: Gait belt;Oxygen  Activity Tolerance: Patient tolerated treatment well Patient left: in chair;with call bell/phone within reach;with SCD's reapplied Nurse Communication: Mobility status PT Visit Diagnosis: Unsteadiness on feet (R26.81);Other abnormalities of gait and mobility (R26.89);Muscle weakness (generalized) (M62.81);Difficulty in walking, not elsewhere classified (R26.2)    Time: 9070-9046 PT Time Calculation (min) (ACUTE ONLY): 24 min   Charges:   PT Evaluation $PT Eval Low Complexity: 1 Low   PT General Charges $$ ACUTE PT VISIT: 1  Visit         Leontine Roads, PT, DPT [facility]  Rehabilitation Services Physical Therapist Office: [phone] Website: [email or web address]    Leontine GORMAN Roads 06/19/2024, 11:13 AM
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
## 2024-06-19 NOTE — Evaluation (Signed)
 Occupational Therapy Evaluation [name] MRN: [id number] DOB: Sep 17, 1950 Today's Date: 06/19/2024   History of Present Illness   73 y/o M presentign to ED on 10/4 with SOB and COPD exacerbation. Pt was changed to #6 cuffed trachand placed on vent.    PMH includes COPD, anxiety, chronic trach #6 cuffless     Clinical Impressions Pt ind at baseline with ADLs/functional mobility, lives in a high rise senior living apartment on the 4th floor (has elevator but currently broken and has 3 flights of stairs). Pt currently with SOB with trach collar to RA, incr RR to 30s. Pt able to ambulate short hallway distance before desatting to 85% with trach collar to RA. Pt currently needs up to min A for ADLs, ind with bed mobility and supervision for transfers without AD. Pt presenting with impairments listed below, will follow acutely. Recommend HHOT at d/c pending progression.     If plan is discharge home, recommend the following:   A little help with walking and/or transfers;A little help with bathing/dressing/bathroom;Assistance with cooking/housework;Direct supervision/assist for medications management;Direct supervision/assist for financial management;Assist for transportation;Help with stairs or ramp for entrance     Functional Status Assessment   Patient has had a recent decline in their functional status and demonstrates the ability to make significant improvements in function in a reasonable and predictable amount of time.     Equipment Recommendations   None recommended by OT     Recommendations for Other Services   PT consult     Precautions/Restrictions   Precautions Precautions: Fall Precaution/Restrictions Comments: chronic trach, PMV, watch O2/RR Restrictions Weight Bearing Restrictions Per Provider Order: No     Mobility Bed Mobility Overal bed mobility: Independent                  Transfers Overall transfer level: Needs  assistance Equipment used: None Transfers: Sit to/from Stand Sit to Stand: Supervision           General transfer comment: x2 standing rest breaks 2/2 SOB      Balance                                           ADL either performed or assessed with clinical judgement   ADL Overall ADL's : Needs assistance/impaired Eating/Feeding: Set up;Sitting   Grooming: Minimal assistance;Standing   Upper Body Bathing: Minimal assistance;Sitting;Standing   Lower Body Bathing: Minimal assistance;Sit to/from stand;Sitting/lateral leans   Upper Body Dressing : Minimal assistance;Standing;Sitting   Lower Body Dressing: Minimal assistance;Sit to/from stand;Sitting/lateral leans   Toilet Transfer: Supervision/safety   Toileting- Clothing Manipulation and Hygiene: Minimal assistance       Functional mobility during ADLs: Minimal assistance       Vision   Vision Assessment?: No apparent visual deficits     Perception Perception: Not tested       Praxis Praxis: Not tested       Pertinent Vitals/Pain Pain Assessment Pain Assessment: Faces Pain Score: 4  Faces Pain Scale: Hurts little more Pain Location: ribs with coughing Pain Descriptors / Indicators: Aching Pain Intervention(s): Limited activity within patient's tolerance, Monitored during session, Repositioned     Extremity/Trunk Assessment Upper Extremity Assessment Upper Extremity Assessment: Overall WFL for tasks assessed   Lower Extremity Assessment Lower Extremity Assessment: Defer to PT evaluation   Cervical / Trunk Assessment Cervical / Trunk Assessment: Normal  Communication Communication Communication: Impaired Factors Affecting Communication: Trach/intubated (SOB, intermittently donning/doffing PMV)   Cognition Arousal: Alert Behavior During Therapy: WFL for tasks assessed/performed Cognition: No apparent impairments                               Following commands:  Intact       Cueing  General Comments   Cueing Techniques: Verbal cues  SpO2 down to 85% at lowest with trach collar to RA   Exercises     Shoulder Instructions      Home Living Family/patient expects to be discharged to:: Private residence Living Arrangements: Alone Available Help at Discharge: Family;Available PRN/intermittently (GF and kids live nearby) Type of Home: Apartment Home Access: Elevator;Other (comment) Advertising account executive broken (3 flights of stairs to apt.))     Home Layout: One level     Bathroom Shower/Tub: Producer, television/film/video: Standard     Home Equipment: Shower seat;Cane - single point;Rolling Environmental consultant (2 wheels)          Prior Functioning/Environment Prior Level of Function : Independent/Modified Independent;History of Falls (last six months)             Mobility Comments: ind no device, falls x2 ADLs Comments: ind    OT Problem List: Decreased strength;Decreased range of motion;Decreased activity tolerance;Impaired balance (sitting and/or standing);Decreased safety awareness;Cardiopulmonary status limiting activity   OT Treatment/Interventions: Self-care/ADL training;Therapeutic exercise;Energy conservation;DME and/or AE instruction;Therapeutic activities;Patient/family education;Balance training      OT Goals(Current goals can be found in the care plan section)   Acute Rehab OT Goals Patient Stated Goal: none stated OT Goal Formulation: With patient Time For Goal Achievement: 07/03/24 Potential to Achieve Goals: Good ADL Goals Pt Will Perform Upper Body Dressing: with modified independence;sitting Pt Will Perform Lower Body Dressing: with modified independence;sitting/lateral leans;sit to/from stand Pt Will Transfer to Toilet: with modified independence;ambulating;regular height toilet Pt Will Perform Tub/Shower Transfer: Shower transfer;with modified independence;ambulating Additional ADL Goal #1: pt will tolerate OOB standing  activity with SpO2 90% and above in prep for ADLs   OT Frequency:  Min 2X/week    Co-evaluation              AM-PAC OT 6 Clicks Daily Activity     Outcome Measure Help from another person eating meals?: A Little Help from another person taking care of personal grooming?: A Little Help from another person toileting, which includes using toliet, bedpan, or urinal?: A Little Help from another person bathing (including washing, rinsing, drying)?: A Little Help from another person to put on and taking off regular upper body clothing?: A Little Help from another person to put on and taking off regular lower body clothing?: A Little 6 Click Score: 18   End of Session Equipment Utilized During Treatment: Gait belt Nurse Communication: Mobility status;Other (comment) (spo2)  Activity Tolerance: Patient tolerated treatment well Patient left: in chair;with call bell/phone within reach  OT Visit Diagnosis: Other abnormalities of gait and mobility (R26.89);Unsteadiness on feet (R26.81);Muscle weakness (generalized) (M62.81)                Time: 9070-9047 OT Time Calculation (min): 23 min Charges:  OT General Charges $OT Visit: 1 Visit OT Evaluation $OT Eval Moderate Complexity: 1 Mod  Deandria Klute K, OTD, OTR/L SecureChat Preferred Acute Rehab [phone]   Pearline Yerby K Koonce 06/19/2024, 10:12 AM

## 2024-06-19 NOTE — Telephone Encounter (Signed)
 Currently in hospital
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
## 2024-06-20 DIAGNOSIS — Z930 Tracheostomy status: Secondary | ICD-10-CM | POA: Diagnosis not present

## 2024-06-20 DIAGNOSIS — J9601 Acute respiratory failure with hypoxia: Secondary | ICD-10-CM | POA: Diagnosis not present

## 2024-06-20 DIAGNOSIS — J471 Bronchiectasis with (acute) exacerbation: Secondary | ICD-10-CM

## 2024-06-20 DIAGNOSIS — J206 Acute bronchitis due to rhinovirus: Secondary | ICD-10-CM | POA: Diagnosis not present

## 2024-06-20 DIAGNOSIS — J441 Chronic obstructive pulmonary disease with (acute) exacerbation: Secondary | ICD-10-CM | POA: Diagnosis not present

## 2024-06-20 LAB — GLUCOSE, CAPILLARY
Glucose-Capillary: 100 mg/dL — ABNORMAL HIGH (ref 70–99)
Glucose-Capillary: 102 mg/dL — ABNORMAL HIGH (ref 70–99)
Glucose-Capillary: 114 mg/dL — ABNORMAL HIGH (ref 70–99)
Glucose-Capillary: 118 mg/dL — ABNORMAL HIGH (ref 70–99)
Glucose-Capillary: 119 mg/dL — ABNORMAL HIGH (ref 70–99)
Glucose-Capillary: 131 mg/dL — ABNORMAL HIGH (ref 70–99)

## 2024-06-20 LAB — BASIC METABOLIC PANEL WITH GFR
Anion gap: 10 (ref 5–15)
BUN: 10 mg/dL (ref 8–23)
CO2: 26 mmol/L (ref 22–32)
Calcium: 9.1 mg/dL (ref 8.9–10.3)
Chloride: 100 mmol/L (ref 98–111)
Creatinine, Ser: 0.68 mg/dL (ref 0.61–1.24)
GFR, Estimated: >60 mL/min (ref >60)
Glucose, Bld: 125 mg/dL — ABNORMAL HIGH (ref 70–99)
Potassium: 4.4 mmol/L (ref 3.5–5.1)
Sodium: 136 mmol/L (ref 135–145)

## 2024-06-20 LAB — CBC
HCT: 41.7 % (ref 39.0–52.0)
Hemoglobin: 13.9 g/dL (ref 13.0–17.0)
MCH: 30.8 pg (ref 26.0–34.0)
MCHC: 33.3 g/dL (ref 30.0–36.0)
MCV: 92.3 fL (ref 80.0–100.0)
Platelets: 300 K/uL (ref 150–400)
RBC: 4.52 MIL/uL (ref 4.22–5.81)
RDW: 13.5 % (ref 11.5–15.5)
WBC: 6.1 K/uL (ref 4.0–10.5)
nRBC: 0 % (ref 0.0–0.2)

## 2024-06-20 LAB — MAGNESIUM: Magnesium: 2.4 mg/dL (ref 1.7–2.4)

## 2024-06-20 MED ORDER — PREDNISONE 20 MG PO TABS
40.0000 mg | ORAL_TABLET | Freq: Every day | ORAL | Status: DC
  Administered 2024-06-21 – 2024-06-23 (×3): 40 mg via ORAL
  Filled 2024-06-20 (×4): qty 2
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
## 2024-06-20 NOTE — Plan of Care (Signed)

## 2024-06-20 NOTE — Progress Notes (Addendum)
 RT transferred pt to CT and back to 3E30 with no complications

## 2024-06-20 NOTE — Progress Notes (Signed)
 Physical Therapy Treatment [name] MRN: [id number] DOB: [birth year] Today's Date: 06/20/2024   History of Present Illness 73 y/o M adm 06/17/24 with SOB and COPD exacerbation. Trach change and placed on vent day of admission only. PMhx: COPD, anxiety, chronic trach since 2017, HTN, vocal cord paralysis, BPH    PT Comments  Pt pleasant on RA with PMSV. Pt tolerating gait well on RA with PMSV and mask due to droplet precautions but notable desaturation with stair requiring standing rest. Pt educated for progressive gait, need for home pulse ox and repeated sit to stands for strengthening. Will continue to follow without post acute needs at this time.  Pt reports elevator at apartment fixed to ease transition home.  At rest on RA 95%, desaturation to 85% on stairs RA, 92% on RA with gait   If plan is discharge home, recommend the following: Assist for transportation   Can travel by private vehicle        Equipment Recommendations  Other (comment) (home pulse ox)    Recommendations for Other Services       Precautions / Restrictions Precautions Precautions: Other (comment) Recall of Precautions/Restrictions: Intact Precaution/Restrictions Comments: chronic trach, PMV, watch SPO2     Mobility  Bed Mobility Overal bed mobility: Independent                  Transfers Overall transfer level: Independent                 General transfer comment: pt able to rise from bed and performed 10 repeated sit to stands in 40 sec with minor UB reliance    Ambulation/Gait Ambulation/Gait assistance: Supervision Gait Distance (Feet): 300 Feet Assistive device: None Gait Pattern/deviations: WFL(Within Functional Limits)   Gait velocity interpretation: 1.31 - 2.62 ft/sec, indicative of limited community ambulator   General Gait Details: pt with steady gait on RA without AD. Pt able to walk 300' with SPO2 89-95% on RA with cues for decreased speed to  maintain SPO2   Stairs Stairs: Yes Stairs assistance: Modified independent (Device/Increase time) Stair Management: One rail Left, Alternating pattern, Forwards Number of Stairs: 11 General stair comments: pt able to complete stairs without physical assist with desaturation to 85% on stairs requiring standing rest of grossly 1 min to recover to 92% with mask and PMV removed to recover. After recovery PMSV and mask donned for return ambulation   Wheelchair Mobility     Tilt Bed    Modified Rankin (Stroke Patients Only)       Balance Overall balance assessment: Mild deficits observed, not formally tested                                          Communication Communication Communication: Impaired Factors Affecting Communication: Trach/intubated;Passey - Muir valve  Cognition Arousal: Alert Behavior During Therapy: WFL for tasks assessed/performed   PT - Cognitive impairments: No apparent impairments                         Following commands: Intact      Cueing Cueing Techniques: Verbal cues  Exercises      General Comments        Pertinent Vitals/Pain Pain Assessment Pain Assessment: No/denies pain    Home Living  Prior Function            PT Goals (current goals can now be found in the care plan section) Progress towards PT goals: Progressing toward goals    Frequency    Min 1X/week      PT Plan      Co-evaluation              AM-PAC PT 6 Clicks Mobility   Outcome Measure  Help needed turning from your back to your side while in a flat bed without using bedrails?: None Help needed moving from lying on your back to sitting on the side of a flat bed without using bedrails?: None Help needed moving to and from a bed to a chair (including a wheelchair)?: None Help needed standing up from a chair using your arms (e.g., wheelchair or bedside chair)?: None Help needed to walk  in hospital room?: A Little Help needed climbing 3-5 steps with a railing? : A Little 6 Click Score: 22    End of Session   Activity Tolerance: Patient tolerated treatment well Patient left: in chair;with call bell/phone within reach;Other (comment) (RT present) Nurse Communication: Mobility status PT Visit Diagnosis: Other abnormalities of gait and mobility (R26.89)     Time: 8882-8867 PT Time Calculation (min) (ACUTE ONLY): 15 min  Charges:    $Gait Training: 8-22 mins PT General Charges $$ ACUTE PT VISIT: 1 Visit                     Lenoard SQUIBB, PT Acute Rehabilitation Services Office: [phone]    Brynnlee Cumpian B Essence Merle 06/20/2024, 1:12 PM
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
## 2024-06-20 NOTE — Progress Notes (Signed)
 Mobility Specialist Progress Note:    06/20/24 0930  Mobility  Activity Ambulated with assistance  Level of Assistance Standby assist, set-up cues, supervision of patient - no hands on  Assistive Device None  Distance Ambulated (ft) 500 ft  Activity Response Tolerated well  Mobility Referral Yes  Mobility visit 1 Mobility  Mobility Specialist Start Time (ACUTE ONLY) 0930  Mobility Specialist Stop Time (ACUTE ONLY) 0947  Mobility Specialist Time Calculation (min) (ACUTE ONLY) 17 min   Pt pleasant and agreeable to session. Pt has some chest congestion during ambulation but otherwise no c/o any symptoms. Pt able to move and ambulate well w/o much assist. Returned pt to bed w/ all needs met.   Venetia Keel Mobility Specialist Please Neurosurgeon or Rehab Office at [phone]

## 2024-06-21 ENCOUNTER — Encounter (HOSPITAL_COMMUNITY): Payer: Self-pay

## 2024-06-21 ENCOUNTER — Telehealth (HOSPITAL_BASED_OUTPATIENT_CLINIC_OR_DEPARTMENT_OTHER): Payer: Self-pay

## 2024-06-21 DIAGNOSIS — J9601 Acute respiratory failure with hypoxia: Secondary | ICD-10-CM | POA: Diagnosis not present

## 2024-06-21 DIAGNOSIS — J441 Chronic obstructive pulmonary disease with (acute) exacerbation: Secondary | ICD-10-CM | POA: Diagnosis not present

## 2024-06-21 DIAGNOSIS — F419 Anxiety disorder, unspecified: Secondary | ICD-10-CM | POA: Diagnosis not present

## 2024-06-21 DIAGNOSIS — J206 Acute bronchitis due to rhinovirus: Secondary | ICD-10-CM | POA: Diagnosis not present

## 2024-06-21 DIAGNOSIS — I10 Essential (primary) hypertension: Secondary | ICD-10-CM

## 2024-06-21 LAB — BASIC METABOLIC PANEL WITH GFR
Anion gap: 7 (ref 5–15)
BUN: 12 mg/dL (ref 8–23)
CO2: 27 mmol/L (ref 22–32)
Calcium: 8.7 mg/dL — ABNORMAL LOW (ref 8.9–10.3)
Chloride: 101 mmol/L (ref 98–111)
Creatinine, Ser: 0.79 mg/dL (ref 0.61–1.24)
GFR, Estimated: >60 mL/min (ref >60)
Glucose, Bld: 88 mg/dL (ref 70–99)
Potassium: 3.9 mmol/L (ref 3.5–5.1)
Sodium: 135 mmol/L (ref 135–145)

## 2024-06-21 LAB — CBC
HCT: 35.8 % — ABNORMAL LOW (ref 39.0–52.0)
Hemoglobin: 12.4 g/dL — ABNORMAL LOW (ref 13.0–17.0)
MCH: 31.7 pg (ref 26.0–34.0)
MCHC: 34.6 g/dL (ref 30.0–36.0)
MCV: 91.6 fL (ref 80.0–100.0)
Platelets: 272 K/uL (ref 150–400)
RBC: 3.91 MIL/uL — ABNORMAL LOW (ref 4.22–5.81)
RDW: 13.8 % (ref 11.5–15.5)
WBC: 7.9 K/uL (ref 4.0–10.5)
nRBC: 0 % (ref 0.0–0.2)

## 2024-06-21 LAB — MAGNESIUM: Magnesium: 2.3 mg/dL (ref 1.7–2.4)

## 2024-06-21 LAB — GLUCOSE, CAPILLARY
Glucose-Capillary: 110 mg/dL — ABNORMAL HIGH (ref 70–99)
Glucose-Capillary: 121 mg/dL — ABNORMAL HIGH (ref 70–99)
Glucose-Capillary: 123 mg/dL — ABNORMAL HIGH (ref 70–99)
Glucose-Capillary: 49 mg/dL — ABNORMAL LOW (ref 70–99)
Glucose-Capillary: 85 mg/dL (ref 70–99)
Glucose-Capillary: 96 mg/dL (ref 70–99)
Glucose-Capillary: 97 mg/dL (ref 70–99)

## 2024-06-21 NOTE — Progress Notes (Signed)
 MD please call with all updates to  [name] [name]  [phone] Daughter   Daughter would like to discuss -transfer to [facility] -medical progress  Thank you
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
## 2024-06-21 NOTE — Telephone Encounter (Signed)
 Copied from CRM [id number]. Topic: Clinical - Medical Advice >> Jun 20, 2024  2:26 PM [name] [name] wrote: Reason for CRM: Patient is calling to inform provider that he is in the hospital and has been since Friday for breathing issues.

## 2024-06-21 NOTE — Assessment & Plan Note (Signed)
 06/21/24 stable. On prn xanax 

## 2024-06-21 NOTE — Assessment & Plan Note (Signed)
 06/21/24 on hytrin  5 mg at bedtime.

## 2024-06-21 NOTE — Progress Notes (Signed)
 Occupational Therapy Treatment [name] MRN: [id number] DOB: 1951-08-17 Today's Date: 06/21/2024   History of present illness 73 y/o M adm 06/17/24 with SOB and COPD exacerbation. Trach change and placed on vent day of admission only. PMhx: COPD, anxiety, chronic trach since 2017, HTN, vocal cord paralysis, BPH   OT comments  Pt progressing well toward goals, performing toilet transfer and LB ADL with supervision overall. Educated pt on importance of continued mobility during hospital admission, SpO2 93-94% on 6L 28% FiO2 trach collar. Pt presenting with impairments listed below, will follow acutely. Anticipate no OT follow up needs at d/c.       If plan is discharge home, recommend the following:  A little help with walking and/or transfers;A little help with bathing/dressing/bathroom;Assistance with cooking/housework;Direct supervision/assist for medications management;Direct supervision/assist for financial management;Assist for transportation;Help with stairs or ramp for entrance   Equipment Recommendations  None recommended by OT    Recommendations for Other Services PT consult    Precautions / Restrictions Precautions Precautions: Other (comment) Recall of Precautions/Restrictions: Intact Precaution/Restrictions Comments: chronic trach, PMV, watch SPO2 Restrictions Weight Bearing Restrictions Per Provider Order: No       Mobility Bed Mobility               General bed mobility comments: OOB in chair    Transfers Overall transfer level: Independent Equipment used: None Transfers: Sit to/from Stand Sit to Stand: Supervision                 Balance Overall balance assessment: No apparent balance deficits (not formally assessed)                                         ADL either performed or assessed with clinical judgement   ADL Overall ADL's : Needs assistance/impaired                     Lower Body  Dressing: Supervision/safety;Sitting/lateral leans   Toilet Transfer: Supervision/safety;Ambulation           Functional mobility during ADLs: Supervision/safety      Extremity/Trunk Assessment Upper Extremity Assessment Upper Extremity Assessment: Generalized weakness   Lower Extremity Assessment Lower Extremity Assessment: Defer to PT evaluation        Vision   Vision Assessment?: No apparent visual deficits   Perception Perception Perception: Not tested   Praxis Praxis Praxis: Not tested   Communication Communication Communication: Impaired Factors Affecting Communication: Trach/intubated;[facility] valve   Cognition Arousal: Alert Behavior During Therapy: WFL for tasks assessed/performed Cognition: No apparent impairments                               Following commands: Intact        Cueing   Cueing Techniques: Verbal cues  Exercises      Shoulder Instructions       General Comments SpO2 93% on trach to 6L 28% FiO2    Pertinent Vitals/ Pain       Pain Assessment Pain Assessment: No/denies pain  Home Living                                          Prior Functioning/Environment  Frequency  Min 2X/week        Progress Toward Goals  OT Goals(current goals can now be found in the care plan section)  Progress towards OT goals: Progressing toward goals  Acute Rehab OT Goals Patient Stated Goal: did not state OT Goal Formulation: With patient Time For Goal Achievement: 07/03/24 Potential to Achieve Goals: Good ADL Goals Pt Will Perform Upper Body Dressing: with modified independence;sitting Pt Will Perform Lower Body Dressing: with modified independence;sitting/lateral leans;sit to/from stand Pt Will Transfer to Toilet: with modified independence;ambulating;regular height toilet Pt Will Perform Tub/Shower Transfer: Shower transfer;with modified independence;ambulating Additional ADL  Goal #1: pt will tolerate OOB standing activity with SpO2 90% and above in prep for ADLs  Plan      Co-evaluation                 AM-PAC OT 6 Clicks Daily Activity     Outcome Measure   Help from another person eating meals?: None Help from another person taking care of personal grooming?: None Help from another person toileting, which includes using toliet, bedpan, or urinal?: A Little Help from another person bathing (including washing, rinsing, drying)?: A Little Help from another person to put on and taking off regular upper body clothing?: None Help from another person to put on and taking off regular lower body clothing?: A Little 6 Click Score: 21    End of Session    OT Visit Diagnosis: Other abnormalities of gait and mobility (R26.89);Unsteadiness on feet (R26.81);Muscle weakness (generalized) (M62.81)   Activity Tolerance Patient tolerated treatment well   Patient Left in chair;with call bell/phone within reach   Nurse Communication Mobility status;Other (comment) (spo2)        Time: 8873-8861 OT Time Calculation (min): 12 min  Charges: OT General Charges $OT Visit: 1 Visit OT Treatments $Self Care/Home Management : 8-22 mins  Marrissa Dai K, OTD, OTR/L SecureChat Preferred Acute Rehab [phone]   Laneta POUR Koonce 06/21/2024, 12:09 PM

## 2024-06-21 NOTE — Assessment & Plan Note (Signed)
 06/21/24 on po prednisone  and nebs. Pt walked with physical therapy 400 feet today. Medically stable for DC tomorrow.
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
## 2024-06-21 NOTE — Progress Notes (Signed)
 Mobility Specialist Progress Note:   06/21/24 1030  Mobility  Activity Ambulated with assistance  Level of Assistance Standby assist, set-up cues, supervision of patient - no hands on  Assistive Device None  Distance Ambulated (ft) 500 ft  Activity Response Tolerated fair  Mobility Referral Yes  Mobility visit 1 Mobility  Mobility Specialist Start Time (ACUTE ONLY) 1030  Mobility Specialist Stop Time (ACUTE ONLY) 1042  Mobility Specialist Time Calculation (min) (ACUTE ONLY) 12 min   Received pt sitting in recliner eager and agreeable to session. No c/o any symptoms. Pt moving and ambulating well just congested which seems like its affecting his breathing. Pt took two standing breaks before finishing session. Returned pt to recliner feeling well and all needs met.   Venetia Keel Mobility Specialist Please Neurosurgeon or Rehab Office at [phone]
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
## 2024-06-21 NOTE — Assessment & Plan Note (Signed)
 06/21/24 chronic. Has #6 cuffless Shiley

## 2024-06-21 NOTE — Assessment & Plan Note (Signed)
 06/21/24 off mechanical ventilation. On trach collar. Wean to RA.

## 2024-06-21 NOTE — Assessment & Plan Note (Signed)
 06/21/24 continue supportive care. Will order home DME nebulizer machine.

## 2024-06-22 DIAGNOSIS — J441 Chronic obstructive pulmonary disease with (acute) exacerbation: Secondary | ICD-10-CM | POA: Diagnosis not present

## 2024-06-22 LAB — GLUCOSE, CAPILLARY
Glucose-Capillary: 79 mg/dL (ref 70–99)
Glucose-Capillary: 138 mg/dL — ABNORMAL HIGH (ref 70–99)

## 2024-06-22 NOTE — Progress Notes (Signed)
 Mobility Specialist Progress Note:    06/22/24 1008  Mobility  Activity Ambulated with assistance  Level of Assistance Standby assist, set-up cues, supervision of patient - no hands on  Assistive Device None  Distance Ambulated (ft) 500 ft  Activity Response Tolerated well  Mobility Referral Yes  Mobility visit 1 Mobility  Mobility Specialist Start Time (ACUTE ONLY) 1008  Mobility Specialist Stop Time (ACUTE ONLY) 1020  Mobility Specialist Time Calculation (min) (ACUTE ONLY) 12 min   Pt pleasant and agreeable to session. No c/o any symptoms. Pt moving and ambulating better everyday w/ improved breathing. Returned pt to room in recliner w/ all needs met.   Venetia Keel Mobility Specialist Please Neurosurgeon or Rehab Office at [phone]

## 2024-06-22 NOTE — Plan of Care (Signed)

## 2024-06-22 NOTE — Progress Notes (Signed)
 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 On prn xanax    Essential hypertension - hytrin  5 mg at bedtime.   DVT prophylaxis: Subcutaneous Code Status: Full code Family Communication: None present, called and updated daughter Disposition Plan: Home likely tomorrow  Consultants:    Procedures:   Antimicrobials:    Objective: Vitals:   06/22/24 0314 06/22/24 0400 06/22/24 0500 06/22/24 0802  BP:  138/74    Pulse: 70 88    Resp: 18 15    Temp:  98.4 F (36.9 C)    TempSrc:  Oral  Oral  SpO2: 100% 100%    Weight:   70.4 kg   Height:        Intake/Output Summary (Last 24 hours) at 06/22/2024 1141 Last data filed at 06/22/2024 0900 Gross per 24 hour  Intake 480 ml  Output 200 ml  Net 280 ml   Filed Weights   06/20/24 0344 06/21/24 0232 06/22/24 0500  Weight: 71.3 kg 70.9 kg 70.4 kg    Examination:  General exam: Appears calm and comfortable  HEENT:  Trach collar Respiratory system: Few scattered rhonchi otherwise clear Cardiovascular system: S1 & S2 heard, RRR.  Abd: nondistended, soft and nontender.Normal bowel sounds heard. Central nervous system: Alert and oriented. No focal neurological deficits. Extremities: no edema Skin: No rashes Psychiatry:  Mood & affect appropriate.     Data Reviewed:   CBC: Recent Labs  Lab 06/17/24 0420 06/17/24 0534 06/18/24 0406 06/20/24 0216 06/21/24 0220  WBC 11.2*  --  9.6 6.1 7.9  NEUTROABS 7.9*  --   --   --   --   HGB 13.1 13.6 12.3* 13.9 12.4*  HCT 38.4* 40.0 35.9* 41.7 35.8*  MCV 93.2  --  91.8 92.3 91.6  PLT 249  --  252 300 272   Basic Metabolic Panel: Recent Labs  Lab 06/17/24 0420 06/17/24 0534 06/18/24 0406 06/20/24 0216 06/21/24 0220  NA 133* 134* 137 136 135  K 3.8 3.3* 4.2 4.4 3.9  CL 100  --  103 100 101  CO2 20*  --  23 26 27   GLUCOSE 164*  --  103* 125* 88  BUN 10  --  10 10 12   CREATININE 0.76  --  0.70 0.68 0.79  CALCIUM  8.5*  --  8.6* 9.1 8.7*  MG  --   --  2.4 2.4 2.3   GFR: Estimated Creatinine Clearance: 71.5 mL/min (by C-G formula based on SCr of 0.79 mg/dL). Liver Function Tests: Recent Labs  Lab 06/17/24 0420  AST 26  ALT 24  ALKPHOS 62  BILITOT 0.4  PROT 6.8  ALBUMIN  3.6   No results for input(s): LIPASE, AMYLASE in the last 168 hours. No results for input(s): AMMONIA in the last 168 hours. Coagulation Profile: No results for input(s): INR, PROTIME in the last 168 hours. Cardiac Enzymes: No results for input(s): CKTOTAL, CKMB, CKMBINDEX, TROPONINI in the last 168 hours. BNP (last 3 results) No results for input(s): PROBNP in the last 8760 hours. HbA1C: No results for input(s): HGBA1C in the last 72 hours. CBG: Recent Labs  Lab 06/21/24 1117 06/21/24 1658 06/21/24 2148 06/21/24 2345 06/22/24 0357  GLUCAP 96 121* 110* 97 79   Lipid Profile: No results for input(s): CHOL, HDL, LDLCALC, TRIG,  CHOLHDL, LDLDIRECT in the last 72 hours. Thyroid Function Tests: No results for input(s): TSH, T4TOTAL, FREET4, T3FREE, THYROIDAB in the last 72 hours. Anemia Panel: No results for input(s): VITAMINB12, FOLATE, FERRITIN, TIBC, IRON, RETICCTPCT in the last 72 hours. Urine analysis:    Component Value Date/Time   COLORURINE YELLOW 01/06/2024 1331   APPEARANCEUR CLEAR 01/06/2024 1331   LABSPEC 1.011 01/06/2024 1331   PHURINE 5.0 01/06/2024 1331   GLUCOSEU NEGATIVE 01/06/2024 1331   HGBUR NEGATIVE 01/06/2024 1331   BILIRUBINUR NEGATIVE 01/06/2024 1331   KETONESUR NEGATIVE 01/06/2024 1331   PROTEINUR NEGATIVE 01/06/2024 1331   UROBILINOGEN 0.2 09/30/2013 0224   NITRITE NEGATIVE 01/06/2024 1331   LEUKOCYTESUR NEGATIVE 01/06/2024 1331   Sepsis Labs: @LABRCNTIP (procalcitonin:4,lacticidven:4)  ) Recent Results (from the past 240 hours)  Resp panel by RT-PCR (RSV, Flu A&B, Covid) Anterior Nasal Swab     Status: None   Collection Time: 06/17/24  4:25 AM   Specimen: Anterior Nasal Swab  Result Value Ref Range Status   SARS Coronavirus 2 by RT PCR NEGATIVE NEGATIVE Final   Influenza A by PCR NEGATIVE NEGATIVE Final   Influenza B by PCR NEGATIVE NEGATIVE Final    Comment: (NOTE) The Xpert Xpress SARS-CoV-2/FLU/RSV plus assay is intended as an aid in the diagnosis of influenza from Nasopharyngeal swab specimens and should not be used as a sole basis for treatment. Nasal washings and aspirates are unacceptable for Xpert Xpress SARS-CoV-2/FLU/RSV testing.  Fact Sheet for Patients: BloggerCourse.com  Fact Sheet for Healthcare Providers: SeriousBroker.it  This test is not yet approved or cleared by the [place]  FDA and has been authorized for detection and/or diagnosis of SARS-CoV-2 by FDA under an Emergency Use Authorization (EUA). This EUA will remain in effect (meaning this test can be used) for the  duration of the COVID-19 declaration under Section 564(b)(1) of the Act, 21 U.S.C. section 360bbb-3(b)(1), unless the authorization is terminated or revoked.     Resp Syncytial Virus by PCR NEGATIVE NEGATIVE Final    Comment: (NOTE) Fact Sheet for Patients: BloggerCourse.com  Fact Sheet for Healthcare Providers: SeriousBroker.it  This test is not yet approved or cleared by the [place]  FDA and has been authorized for detection and/or diagnosis of SARS-CoV-2 by FDA under an Emergency Use Authorization (EUA). This EUA will remain in effect (meaning this test can be used) for the duration of the COVID-19 declaration under Section 564(b)(1) of the Act, 21 U.S.C. section 360bbb-3(b)(1), unless the authorization is terminated or revoked.  Performed at [facility], [address] [place], [place] [address]   MRSA Next Gen by PCR, Nasal     Status: None   Collection Time: 06/17/24  6:57 AM   Specimen: Nasal Mucosa; Nasal Swab  Result Value Ref Range Status   MRSA by PCR Next Gen  NOT DETECTED NOT DETECTED Final    Comment: (NOTE) The GeneXpert MRSA Assay (FDA approved for NASAL specimens only), is one component of a comprehensive MRSA colonization surveillance program. It is not intended to diagnose MRSA infection nor to guide or monitor treatment for MRSA infections. Test performance is not FDA approved in patients less than 47 years old. Performed at [facility], [address], [place], [place] [address]   Respiratory (~20 pathogens) panel by PCR     Status: Abnormal   Collection Time: 06/17/24 10:12 AM   Specimen: Nasopharyngeal Swab; Respiratory  Result Value Ref Range Status   Adenovirus NOT DETECTED NOT DETECTED Final   Coronavirus 229E NOT DETECTED NOT DETECTED Final    Comment: (NOTE) The Coronavirus on the Respiratory Panel, DOES NOT test for the novel  Coronavirus (2019 nCoV)    Coronavirus HKU1  NOT DETECTED NOT DETECTED Final   Coronavirus NL63 NOT DETECTED NOT DETECTED Final   Coronavirus OC43 NOT DETECTED NOT DETECTED Final   Metapneumovirus NOT DETECTED NOT DETECTED Final   Rhinovirus / Enterovirus DETECTED (A) NOT DETECTED Final   Influenza A NOT DETECTED NOT DETECTED Final   Influenza B NOT DETECTED NOT DETECTED Final   Parainfluenza Virus 1 NOT DETECTED NOT DETECTED Final   Parainfluenza Virus 2 NOT DETECTED NOT DETECTED Final   Parainfluenza Virus 3 NOT DETECTED NOT DETECTED Final   Parainfluenza Virus 4 NOT DETECTED NOT DETECTED Final   Respiratory Syncytial Virus NOT DETECTED NOT DETECTED Final   Bordetella pertussis NOT DETECTED NOT DETECTED Final   Bordetella Parapertussis NOT DETECTED NOT DETECTED Final   Chlamydophila pneumoniae NOT DETECTED NOT DETECTED Final   Mycoplasma pneumoniae NOT DETECTED NOT DETECTED Final    Comment: Performed at [facility], [address] [place], [place] [address]     Radiology Studies: No results found.   Scheduled Meds:  arformoterol   15 mcg Nebulization BID   aspirin  EC  81 mg Oral q AM   Chlorhexidine  Gluconate Cloth  6 each Topical Daily   finasteride   5 mg Oral Daily   heparin   5,000 Units Subcutaneous Q8H   ipratropium-albuterol   3 mL Nebulization Q4H   mouth rinse  15 mL Mouth Rinse 4 times per day   pantoprazole   40 mg Oral Daily   predniSONE   40 mg Oral Daily   revefenacin   175 mcg Nebulization Daily   rosuvastatin   5 mg Oral Daily   terazosin   5 mg Oral QHS   Continuous Infusions:  albuterol  10 mg/hr (06/19/24 1046)     LOS: 5 days    Time spent:    Sigurd Pac, MD [facility]   06/22/2024, 11:41 AM

## 2024-06-22 NOTE — Plan of Care (Signed)

## 2024-06-23 ENCOUNTER — Encounter: Payer: Self-pay | Admitting: Internal Medicine

## 2024-06-23 DIAGNOSIS — J441 Chronic obstructive pulmonary disease with (acute) exacerbation: Secondary | ICD-10-CM | POA: Diagnosis not present

## 2024-06-23 LAB — CBC
HCT: 35.6 % — ABNORMAL LOW (ref 39.0–52.0)
Hemoglobin: 12.2 g/dL — ABNORMAL LOW (ref 13.0–17.0)
MCH: 31.3 pg (ref 26.0–34.0)
MCHC: 34.3 g/dL (ref 30.0–36.0)
MCV: 91.3 fL (ref 80.0–100.0)
Platelets: 273 K/uL (ref 150–400)
RBC: 3.9 MIL/uL — ABNORMAL LOW (ref 4.22–5.81)
RDW: 13.6 % (ref 11.5–15.5)
WBC: 7.7 K/uL (ref 4.0–10.5)
nRBC: 0 % (ref 0.0–0.2)

## 2024-06-23 LAB — BASIC METABOLIC PANEL WITH GFR
Anion gap: 9 (ref 5–15)
BUN: 10 mg/dL (ref 8–23)
CO2: 25 mmol/L (ref 22–32)
Calcium: 8.5 mg/dL — ABNORMAL LOW (ref 8.9–10.3)
Chloride: 101 mmol/L (ref 98–111)
Creatinine, Ser: 0.77 mg/dL (ref 0.61–1.24)
GFR, Estimated: >60 mL/min (ref >60)
Glucose, Bld: 88 mg/dL (ref 70–99)
Potassium: 3.8 mmol/L (ref 3.5–5.1)
Sodium: 135 mmol/L (ref 135–145)

## 2024-06-23 MED ORDER — PREDNISONE 20 MG PO TABS: ORAL_TABLET | ORAL | 0 refills | Status: DC

## 2024-06-23 NOTE — Progress Notes (Signed)
 Brief Progress note   Patient is back to baseline with significant improvement in symptoms. PCCM will sign off. He can resume his home regimen on discharge and follow up with pulmonary outpatient.   Zola Herter, MD [facility] Office: [phone]

## 2024-06-23 NOTE — Progress Notes (Signed)
 Mobility Specialist Progress Note:    06/23/24 0923  Mobility  Activity Ambulated with assistance  Level of Assistance Standby assist, set-up cues, supervision of patient - no hands on  Assistive Device None  Distance Ambulated (ft) 500 ft  Activity Response Tolerated well  Mobility Referral Yes  Mobility visit 1 Mobility  Mobility Specialist Start Time (ACUTE ONLY) [id number]  Mobility Specialist Stop Time (ACUTE ONLY) 0930  Mobility Specialist Time Calculation (min) (ACUTE ONLY) 7 min   Received pt sitting in recliner pleasant and eager for session. Pt stated they are feeling excellent and looking forward to getting out of here. Pt moving and ambulating well. Returned pt to recliner w/ all needs met.   Venetia Keel Mobility Specialist Please Neurosurgeon or Rehab Office at [phone]

## 2024-06-23 NOTE — TOC Transition Note (Addendum)
 Transition of Care [facility]) - Discharge Note   Patient Details  Name: [name] [name] MRN: [id number] Date of Birth: April 16, 1951  [facility] of [facility]) CM/SW Contact:  Waddell Barnie Rama, RN Phone Number: 06/23/2024, 10:30 AM   Clinical Narrative:    Patient is for dc today, he will need a neb machine and he and his daughter states they need a new suction machine with [employer].  They have no preference of the Agency.  NCM made referral to Jermaine with [employer] thru [employer].  Daughter  will transport him home, she has to be at work at 11:15 so will need to have DME delivered to the home. Per MD he does not need HHPT, HHOT he has been ambulating , he ambulated 500 feet with mobility tech.  He will need order for HHST and HHRN.  Checking with doctor to make sure orders were put in for [facility].    Final next level of care: Home w Home Health Services Barriers to Discharge: Continued Medical Work up   Patient Goals and CMS Choice Patient states their goals for this hospitalization and ongoing recovery are:: Plans to return home once stable.   Choice offered to / list presented to : Adult Children [facility])      Discharge Placement                       Discharge Plan and Services Additional resources added to the After Visit Summary for   In-house Referral: NA Discharge Planning Services: CM Consult Post Acute Care Choice: Home Health            DME Agency: NA       HH Arranged: PT, OT, Speech Therapy HH Agency: [facility] Date [facility] Agency Contacted: 06/19/24 Time HH Agency Contacted: 1447 Representative spoke with at [facility] Agency: [place]  Social Drivers of Health (SDOH) Interventions SDOH Screenings   Food Insecurity: No Food Insecurity (06/17/2024)  Housing: Low Risk  (06/17/2024)  Transportation Needs: No Transportation Needs (06/17/2024)  Utilities: Not At Risk (06/17/2024)  Depression (PHQ2-9): Low Risk  (10/06/2021)  Physical Activity:  Sufficiently Active (06/02/2021)  Social Connections: Socially Integrated (06/17/2024)  Tobacco Use: Low Risk  (06/17/2024)     Readmission Risk Interventions     No data to display

## 2024-06-23 NOTE — Discharge Summary (Signed)
 Physician Discharge Summary  [name] [name] FMW:[id number] DOB: 06/20/1951 DOA: 06/17/2024  PCP: [facility]  Admit date: 06/17/2024 Discharge date: 06/23/2024  Time spent: 45 minutes  Recommendations for Outpatient Follow-up:  Follow-up with pulmonary in 2 weeks   Discharge Diagnoses:  Principal Problem:   Acute exacerbation of chronic obstructive pulmonary disease (COPD) (HCC) Active Problems:   Acute respiratory failure with hypoxia (HCC)   Acute bronchitis due to Rhinovirus   Anxiety   Tracheostomy dependent [facility])   Essential hypertension   Discharge Condition: Improved  Diet recommendation: Heart healthy  Filed Weights   06/21/24 0232 06/22/24 0500 06/23/24 0500  Weight: 70.9 kg 70.4 kg 70.6 kg    History of present illness:  Chronically ill 73/M with 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 On prn xanax      Essential hypertension - hytrin  5 mg at bedtime.  Discharge Exam: Vitals:   06/23/24 0825 06/23/24 0847  BP: 129/66   Pulse: 90 96  Resp: 18 (!) 21  Temp: 97.9 F (36.6 C)   SpO2: 95% 96%   General exam: Appears calm and comfortable  HEENT: Trach site unremarkable Respiratory system: Few scattered rhonchi otherwise clear Cardiovascular  system: S1 & S2 heard, RRR.  Abd: nondistended, soft and nontender.Normal bowel sounds heard. Central nervous system: Alert and oriented. No focal neurological deficits. Extremities: no edema Skin: No rashes Psychiatry:  Mood & affect appropriate.     Discharge Instructions   Discharge Instructions     Diet - low sodium heart healthy   Complete by: As directed    Increase activity slowly   Complete by: As directed       Allergies as of 06/23/2024       Reactions   Flonase  [fluticasone ] Other (See Comments)   Caused excess mucous- does not care to use this ever again        Medication List     TAKE these medications    albuterol  108 (90 Base) MCG/ACT inhaler Commonly known as: VENTOLIN  HFA Inhale 2 puffs into the lungs every 4 (four) hours as needed for wheezing or shortness of breath.   albuterol  (2.5 MG/3ML) 0.083% nebulizer solution Commonly known as: PROVENTIL  Take 2.5 mg by nebulization 3 (three) times daily as needed for wheezing or shortness of breath.   APAP 325 MG tablet Take 2 tablets (650 mg total) by mouth every 6 (six) hours as needed. What changed: reasons to take this   Bayer Low Dose 81 MG tablet Generic drug: aspirin  EC Take 81 mg by mouth daily. Swallow whole.   budesonide  0.5 MG/2ML nebulizer solution Commonly known as: Pulmicort  Take 2 mLs (0.5 mg total) by nebulization in the morning and at bedtime.   budesonide -formoterol  160-4.5 MCG/ACT inhaler Commonly known as: SYMBICORT  Inhale 2 puffs into the lungs 2 (two) times daily.   cetirizine 10 MG tablet Commonly known as: ZYRTEC Take 10 mg  by mouth daily.   cyclobenzaprine 10 MG tablet Commonly known as: FLEXERIL Take 10 mg by mouth daily as needed for muscle spasms.   finasteride  5 MG tablet Commonly known as: PROSCAR  Take 5 mg by mouth daily.   formoterol  20 MCG/2ML nebulizer solution Commonly known as: PERFOROMIST  Take 2 mLs (20 mcg total) by nebulization 2 (two) times  daily.   hydrochlorothiazide  25 MG tablet Commonly known as: HYDRODIURIL  Take 1 tablet (25 mg total) by mouth daily.   ipratropium-albuterol  0.5-2.5 (3) MG/3ML Soln Commonly known as: DUONEB Take 3 mLs by nebulization every 6 (six) hours as needed. What changed:  when to take this additional instructions   losartan  50 MG tablet Commonly known as: COZAAR  Take 25 mg by mouth in the morning.   montelukast  10 MG tablet Commonly known as: SINGULAIR  TAKE 1 TABLET(10 MG) BY MOUTH DAILY   Mucinex  600 MG 12 hr tablet Generic drug: guaiFENesin  Take 600 mg by mouth 2 (two) times daily.   One-A-Day Mens 50+ Tabs Take 1 tablet by mouth daily with breakfast.   potassium chloride  SA 20 MEQ tablet Commonly known as: KLOR-CON  M Take 20 mEq by mouth daily.   predniSONE  20 MG tablet Commonly known as: DELTASONE  Take 40mg  for 2days then 20mg  daily for 2 days then STOP Start taking on: June 24, 2024   revefenacin  175 MCG/3ML nebulizer solution Commonly known as: YUPELRI  Take 3 mLs (175 mcg total) by nebulization daily.   rosuvastatin  5 MG tablet Commonly known as: CRESTOR  Take 5 mg by mouth daily.   sodium chloride  0.9 % nebulizer solution Take 3 mLs by nebulization daily as needed (Use 3 ml's or 3-4 drops into trach to loosen phlegm suction secretions or as otherwise instructed).   terazosin  5 MG capsule Commonly known as: HYTRIN  Take 5 mg by mouth at bedtime.   vardenafil 20 MG tablet Commonly known as: LEVITRA Take 20 mg by mouth daily as needed for erectile dysfunction.               Durable Medical Equipment  (From admission, onward)           Start     Ordered   06/23/24 1026  For home use only DME Other see comment  Once       Comments: Suction machine  with yonker  Question:  Length of Need  Answer:  Lifetime   06/23/24 1026   06/21/24 1547  For home use only DME Nebulizer machine  Once       Question Answer Comment  Patient needs a nebulizer to  treat with the following condition COPD (chronic obstructive pulmonary disease) (HCC)   Length of Need Lifetime   Additional equipment included Administration kit   Additional equipment included Filter      06/21/24 1546           Allergies  Allergen Reactions   Flonase  [Fluticasone ] Other (See Comments)    Caused excess mucous- does not care to use this ever again    Contact information for follow-up providers     [facility] Follow up.   Why: neb machine, suction machine with yonker Contact information: [address], [place], [place] [address] [phone]             Contact information for after-discharge care     Home Medical Care     [facility]) .   Service: Home Health Services  The results of significant diagnostics from this hospitalization (including imaging, microbiology, ancillary and laboratory) are listed below for reference.    Significant Diagnostic Studies: CT CHEST WO CONTRAST Result Date: 06/19/2024 CLINICAL DATA:  Respiratory illness with nondiagnostic x-ray. Left lateral lung opacity new in September of 2025. EXAM: CT CHEST WITHOUT CONTRAST TECHNIQUE: Multidetector CT imaging of the chest was performed following the standard protocol without IV contrast. RADIATION DOSE REDUCTION: This exam was performed according to the departmental dose-optimization program which includes automated exposure control, adjustment of the mA and/or kV according to patient size and/or use of iterative reconstruction technique. COMPARISON:  Chest radiograph 06/17/2024 and 05/17/2024. CT chest 05/05/2023 FINDINGS: Cardiovascular: Normal heart size. No pericardial effusions. Normal caliber thoracic aorta. Calcification of the aorta. Mediastinum/Nodes: Esophagus is decompressed. No significant lymphadenopathy. Thyroid gland is unremarkable. Tracheostomy tube is in place. Lungs/Pleura: Motion artifact limits examination.  There is central bronchiectasis with mucous plugging, bronchial wall thickening and patchy peribronchial infiltration. This likely represents bronchiolitis or possibly early bronchopneumonia or aspiration. No focal consolidation demonstrated left base. No pleural effusion or pneumothorax. Upper Abdomen: No acute abnormality. Musculoskeletal: No chest wall mass or suspicious bone lesions identified. IMPRESSION: 1. Central bronchiectasis with mucous plugging, peribronchial wall thickening, and patchy peribronchial infiltration. This is likely to represent bronchiolitis or early bronchopneumonia. Aspiration would also be a consideration in the appropriate clinical setting. 2. No focal consolidation demonstrated in the left base suggesting resolution of previous radiographic finding. 3. Aortic atherosclerosis. Electronically Signed   By: Elsie Gravely M.D.   On: 06/19/2024 23:53   DG Chest Port 1 View Result Date: 06/17/2024 CLINICAL DATA:  Shortness of breath. EXAM: PORTABLE CHEST 1 VIEW COMPARISON:  Portable chest 05/17/2024. FINDINGS: Tracheostomy cannula is in place. The carina is difficult to see but the cannula tip appears to be 7 cm above it. Small electronic device not seen previously superimposes over the lateral left upper chest. There are chronic changes in the left lower lung field. No focal pneumonia is seen. Rest of the lungs are clear. The cardiomediastinal silhouette and vasculature are normal except for calcification of the transverse aorta. Osteopenia. Chronic healed fractures of multiple left ribs, left mid clavicle. No new osseous finding. IMPRESSION: 1. No evidence of acute chest disease. Chronic changes in the left lower lung field. 2. Tracheostomy cannula in place. 3. Aortic atherosclerosis. Electronically Signed   By: Francis Quam M.D.   On: 06/17/2024 04:59    Microbiology: Recent Results (from the past 240 hours)  Resp panel by RT-PCR (RSV, Flu A&B, Covid) Anterior Nasal Swab      Status: None   Collection Time: 06/17/24  4:25 AM   Specimen: Anterior Nasal Swab  Result Value Ref Range Status   SARS Coronavirus 2 by RT PCR NEGATIVE NEGATIVE Final   Influenza A by PCR NEGATIVE NEGATIVE Final   Influenza B by PCR NEGATIVE NEGATIVE Final    Comment: (NOTE) The Xpert Xpress SARS-CoV-2/FLU/RSV plus assay is intended as an aid in the diagnosis of influenza from Nasopharyngeal swab specimens and should not be used as a sole basis for treatment. Nasal washings and aspirates are unacceptable for Xpert Xpress SARS-CoV-2/FLU/RSV testing.  Fact Sheet for Patients: BloggerCourse.com  Fact Sheet for Healthcare Providers: SeriousBroker.it  This test is not yet approved or cleared by the [place]  FDA and has been authorized for detection and/or diagnosis of SARS-CoV-2 by FDA under an Emergency Use Authorization (EUA). This EUA will remain in effect (meaning this test can be  used) for the duration of the COVID-19 declaration under Section 564(b)(1) of the Act, 21 U.S.C. section 360bbb-3(b)(1), unless the authorization is terminated or revoked.     Resp Syncytial Virus by PCR NEGATIVE NEGATIVE Final    Comment: (NOTE) Fact Sheet for Patients: BloggerCourse.com  Fact Sheet for Healthcare Providers: SeriousBroker.it  This test is not yet approved or cleared by the [place]  FDA and has been authorized for detection and/or diagnosis of SARS-CoV-2 by FDA under an Emergency Use Authorization (EUA). This EUA will remain in effect (meaning this test can be used) for the duration of the COVID-19 declaration under Section 564(b)(1) of the Act, 21 U.S.C. section 360bbb-3(b)(1), unless the authorization is terminated or revoked.  Performed at [facility], [address], [place], [place] [address]   MRSA Next Gen by PCR, Nasal     Status: None   Collection Time:  06/17/24  6:57 AM   Specimen: Nasal Mucosa; Nasal Swab  Result Value Ref Range Status   MRSA by PCR Next Gen NOT DETECTED NOT DETECTED Final    Comment: (NOTE) The GeneXpert MRSA Assay (FDA approved for NASAL specimens only), is one component of a comprehensive MRSA colonization surveillance program. It is not intended to diagnose MRSA infection nor to guide or monitor treatment for MRSA infections. Test performance is not FDA approved in patients less than 33 years old. Performed at [facility], [address], [place], [place] [address]   Respiratory (~20 pathogens) panel by PCR     Status: Abnormal   Collection Time: 06/17/24 10:12 AM   Specimen: Nasopharyngeal Swab; Respiratory  Result Value Ref Range Status   Adenovirus NOT DETECTED NOT DETECTED Final   Coronavirus 229E NOT DETECTED NOT DETECTED Final    Comment: (NOTE) The Coronavirus on the Respiratory Panel, DOES NOT test for the novel  Coronavirus (2019 nCoV)    Coronavirus HKU1 NOT DETECTED NOT DETECTED Final   Coronavirus NL63 NOT DETECTED NOT DETECTED Final   Coronavirus OC43 NOT DETECTED NOT DETECTED Final   Metapneumovirus NOT DETECTED NOT DETECTED Final   Rhinovirus / Enterovirus DETECTED (A) NOT DETECTED Final   Influenza A NOT DETECTED NOT DETECTED Final   Influenza B NOT DETECTED NOT DETECTED Final   Parainfluenza Virus 1 NOT DETECTED NOT DETECTED Final   Parainfluenza Virus 2 NOT DETECTED NOT DETECTED Final   Parainfluenza Virus 3 NOT DETECTED NOT DETECTED Final   Parainfluenza Virus 4 NOT DETECTED NOT DETECTED Final   Respiratory Syncytial Virus NOT DETECTED NOT DETECTED Final   Bordetella pertussis NOT DETECTED NOT DETECTED Final   Bordetella Parapertussis NOT DETECTED NOT DETECTED Final   Chlamydophila pneumoniae NOT DETECTED NOT DETECTED Final   Mycoplasma pneumoniae NOT DETECTED NOT DETECTED Final    Comment: Performed at [facility], [address], [place], [place] [address]      Labs: Basic Metabolic Panel: Recent Labs  Lab 06/17/24 0420 06/17/24 0534 06/18/24 0406 06/20/24 0216 06/21/24 0220 06/23/24 0215  NA 133* 134* 137 136 135 135  K 3.8 3.3* 4.2 4.4 3.9 3.8  CL 100  --  103 100 101 101  CO2 20*  --  23 26 27 25   GLUCOSE 164*  --  103* 125* 88 88  BUN 10  --  10 10 12 10   CREATININE 0.76  --  0.70 0.68 0.79 0.77  CALCIUM  8.5*  --  8.6* 9.1 8.7* 8.5*  MG  --   --  2.4  2.4 2.3  --    Liver Function Tests: Recent Labs  Lab 06/17/24 0420  AST 26  ALT 24  ALKPHOS 62  BILITOT 0.4  PROT 6.8  ALBUMIN  3.6   No results for input(s): LIPASE, AMYLASE in the last 168 hours. No results for input(s): AMMONIA in the last 168 hours. CBC: Recent Labs  Lab 06/17/24 0420 06/17/24 0534 06/18/24 0406 06/20/24 0216 06/21/24 0220 06/23/24 0215  WBC 11.2*  --  9.6 6.1 7.9 7.7  NEUTROABS 7.9*  --   --   --   --   --   HGB 13.1 13.6 12.3* 13.9 12.4* 12.2*  HCT 38.4* 40.0 35.9* 41.7 35.8* 35.6*  MCV 93.2  --  91.8 92.3 91.6 91.3  PLT 249  --  252 300 272 273   Cardiac Enzymes: No results for input(s): CKTOTAL, CKMB, CKMBINDEX, TROPONINI in the last 168 hours. BNP: BNP (last 3 results) Recent Labs    07/02/23 0639 01/06/24 1141  BNP 9.0 9.3    ProBNP (last 3 results) No results for input(s): PROBNP in the last 8760 hours.  CBG: Recent Labs  Lab 06/21/24 1658 06/21/24 2148 06/21/24 2345 06/22/24 0357 06/22/24 1224  GLUCAP 121* 110* 97 79 138*       Signed:  Sigurd Pac MD.  [facility] 06/23/2024, 11:27 AM

## 2024-06-23 NOTE — Progress Notes (Addendum)
 Discharge paperwork reviewed with pt who verbalized understanding.  IV and tele removed.  All belongings returned.    Reprinted AVS with DME equipment info provided.  Pt's daughter is here at the main entrance.  Pt brought down via wheelchair with [place], Charity fundraiser

## 2024-06-27 ENCOUNTER — Encounter: Payer: Self-pay | Admitting: Nurse Practitioner

## 2024-06-27 ENCOUNTER — Other Ambulatory Visit: Payer: Self-pay

## 2024-06-27 ENCOUNTER — Ambulatory Visit (INDEPENDENT_AMBULATORY_CARE_PROVIDER_SITE_OTHER): Admitting: Nurse Practitioner

## 2024-06-27 VITALS — BP 104/69 | HR 86 | Temp 97.7°F | Resp 18 | Ht 65.0 in | Wt 153.8 lb

## 2024-06-27 DIAGNOSIS — J3802 Paralysis of vocal cords and larynx, bilateral: Secondary | ICD-10-CM | POA: Diagnosis not present

## 2024-06-27 DIAGNOSIS — J206 Acute bronchitis due to rhinovirus: Secondary | ICD-10-CM | POA: Diagnosis not present

## 2024-06-27 DIAGNOSIS — J4489 Other specified chronic obstructive pulmonary disease: Secondary | ICD-10-CM

## 2024-06-27 DIAGNOSIS — J479 Bronchiectasis, uncomplicated: Secondary | ICD-10-CM

## 2024-06-27 DIAGNOSIS — Z430 Encounter for attention to tracheostomy: Secondary | ICD-10-CM | POA: Diagnosis not present

## 2024-06-27 MED ORDER — FORMOTEROL FUMARATE 20 MCG/2ML IN NEBU: 20.0000 ug | INHALATION_SOLUTION | Freq: Two times a day (BID) | RESPIRATORY_TRACT | 11 refills | Status: AC

## 2024-06-27 MED ORDER — REVEFENACIN 175 MCG/3ML IN SOLN: 175.0000 ug | Freq: Every day | RESPIRATORY_TRACT | 11 refills | Status: AC

## 2024-06-27 MED ORDER — BUDESONIDE 0.5 MG/2ML IN SUSP: 0.5000 mg | Freq: Two times a day (BID) | RESPIRATORY_TRACT | 11 refills | Status: AC
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
## 2024-06-27 NOTE — Progress Notes (Signed)
 Refills sent in per verbal request from Izetta Rouleau, NP. I tried calling pt to inform him but I could not reach and I was unable to lvm. The rx has been printed, signed, and ready to be faxed., NFN

## 2024-06-27 NOTE — Patient Instructions (Signed)
 Continue Albuterol  inhaler 2 puffs every 6 hours as needed for shortness of breath or wheezing. Notify if symptoms persist despite rescue inhaler/neb use.  Continue zyrtec 10 mg daily for allergies Continue singulair  10 mg At bedtime  Continue flonase  nasal spray 2 sprays each nostril daily Continue mucinex  (guaifenesin ) 600 mg Twice daily to loosen phlegm Continue sodium chloride  nebs 3-4 mL once daily as needed; can also use 3-4 drops into the trach to loosen phlegm before suctioning   Continue Symbicort /Breyna  (budesonide -formoterol ) 2 puffs Twice daily until you get your breathing treatments from the [facility] In the meantime, use duoneb 3 mL nebulizer treatment 3-4 times a day and keep doing your Symbicort /Breyna  inhaler .    Once you get the nebulized medications, you will stop the Symbicort  and duoneb and switch to  -budesonide  neb twice a day. Brush tongue and rinse mouth afterwards  - formoterol  neb twice a day  - Yupelri  neb once a day.  Call us  if you haven't gotten these in the next 2 weeks   Ordered chest vest therapy - use twice daily    Follow up in 6-8 weeks with Dr. Darlean or Katie Michie Molnar,NP. Bring all medications with you. If symptoms do not improve or worsen, please contact office for sooner follow up or seek emergency care.

## 2024-06-28 ENCOUNTER — Encounter: Payer: Self-pay | Admitting: Nurse Practitioner

## 2024-06-28 ENCOUNTER — Other Ambulatory Visit: Payer: Self-pay | Admitting: Nurse Practitioner

## 2024-06-28 DIAGNOSIS — J479 Bronchiectasis, uncomplicated: Secondary | ICD-10-CM | POA: Insufficient documentation

## 2024-06-28 NOTE — Assessment & Plan Note (Signed)
 Permanent tracheostomy. Followed by ENT

## 2024-06-28 NOTE — Assessment & Plan Note (Signed)
 Resolved

## 2024-06-28 NOTE — Assessment & Plan Note (Signed)
 Resolved exacerbation. Patient has has daily productive cough lasting greater than 6  months requiring multiple antibiotic courses and hospitalizations.  Flutter valve has been tried, but has not effectively mobilized secretions.  Considered and ruled out manual CPT as no consistent skilled caregiver available to perform therapy.  CT scan performed on 06/19/2024 confirms bronchiectasis.  Starting patient on vest therapy. Continue aggressive mucociliary clearance therapies.

## 2024-06-28 NOTE — Assessment & Plan Note (Signed)
 Resolved exacerbation related to rhinovirus. Chronic moderate to severe obstruction. Lung exam clear. Will reach out to the [facility] again regarding his nebulized regimen. Advised him to contact us  next week if he has yet to receive these. Action plan in place. Graded exercises encouraged. Trigger prevention reviewed.  Patient Instructions  Continue Albuterol  inhaler 2 puffs every 6 hours as needed for shortness of breath or wheezing. Notify if symptoms persist despite rescue inhaler/neb use.  Continue zyrtec 10 mg daily for allergies Continue singulair  10 mg At bedtime  Continue flonase  nasal spray 2 sprays each nostril daily Continue mucinex  (guaifenesin ) 600 mg Twice daily to loosen phlegm Continue sodium chloride  nebs 3-4 mL once daily as needed; can also use 3-4 drops into the trach to loosen phlegm before suctioning   Continue Symbicort /Breyna  (budesonide -formoterol ) 2 puffs Twice daily until you get your breathing treatments from the [facility] In the meantime, use duoneb 3 mL nebulizer treatment 3-4 times a day and keep doing your Symbicort /Breyna  inhaler .    Once you get the nebulized medications, you will stop the Symbicort  and duoneb and switch to  -budesonide  neb twice a day. Brush tongue and rinse mouth afterwards  - formoterol  neb twice a day  - Yupelri  neb once a day.  Call us  if you haven't gotten these in the next 2 weeks   Ordered chest vest therapy - use twice daily    Follow up in 6-8 weeks with Dr. Darlean or Katie Pietro Bonura,NP. Bring all medications with you. If symptoms do not improve or worsen, please contact office for sooner follow up or seek emergency care.

## 2024-06-30 DIAGNOSIS — J449 Chronic obstructive pulmonary disease, unspecified: Secondary | ICD-10-CM | POA: Diagnosis not present

## 2024-06-30 DIAGNOSIS — J9620 Acute and chronic respiratory failure, unspecified whether with hypoxia or hypercapnia: Secondary | ICD-10-CM | POA: Diagnosis not present

## 2024-07-05 ENCOUNTER — Telehealth: Payer: Self-pay | Admitting: Nurse Practitioner

## 2024-07-05 NOTE — Telephone Encounter (Signed)
 Please verify with pt that he has received his nebulized medications from the [facility] ( budesonide , formoterol , yupelri ). If not, need to reach back out to the [facility] pharmacy again. Thanks.

## 2024-07-05 NOTE — Telephone Encounter (Signed)
 ATCx1 unable to LVM

## 2024-07-06 NOTE — Telephone Encounter (Signed)
 Attempted to call unable to leave message.

## 2024-07-06 NOTE — Telephone Encounter (Signed)
 I sent pt a mychart message then completing note per office protocol. NFN

## 2024-07-13 ENCOUNTER — Telehealth: Payer: Self-pay | Admitting: Nurse Practitioner

## 2024-07-13 NOTE — Telephone Encounter (Signed)
 Copied from CRM #[id number]. Topic: Clinical - Prescription Issue >> Jul 13, 2024 10:23 AM Rilla NOVAK wrote: Reason for CRM: Patient calling because he has not received medication from [facility].  Was told to call back in 2 weeks. Please also mentioned he has not received the vest.  Please call patient [phone]   ----------------------------------------------------------------------- From previous Reason for Contact - Other: Reason for CRM:

## 2024-07-14 NOTE — Telephone Encounter (Signed)
 X2 tried to reach out to patient rang 3 time then busy signal      Vest has been order -  [facility],it can take some time for the order to be processed someone from that company will reach out   And for the Rx they have been sent to the [facility], from what I can see.

## 2024-08-17 ENCOUNTER — Ambulatory Visit: Admitting: Nurse Practitioner

## 2024-08-28 ENCOUNTER — Other Ambulatory Visit: Payer: Self-pay

## 2024-08-28 ENCOUNTER — Emergency Department (HOSPITAL_COMMUNITY)

## 2024-08-28 ENCOUNTER — Ambulatory Visit: Payer: Self-pay

## 2024-08-28 ENCOUNTER — Emergency Department (HOSPITAL_COMMUNITY)
Admission: EM | Admit: 2024-08-28 | Discharge: 2024-08-28 | Disposition: A | Discharge status: Home / Self Care | Attending: Emergency Medicine | Admitting: Emergency Medicine

## 2024-08-28 DIAGNOSIS — R0602 Shortness of breath: Secondary | ICD-10-CM | POA: Diagnosis present

## 2024-08-28 DIAGNOSIS — J181 Lobar pneumonia, unspecified organism: Secondary | ICD-10-CM | POA: Diagnosis not present

## 2024-08-28 DIAGNOSIS — J189 Pneumonia, unspecified organism: Secondary | ICD-10-CM

## 2024-08-28 LAB — CBC WITH DIFFERENTIAL/PLATELET
Abs Immature Granulocytes: 0.09 K/uL — ABNORMAL HIGH (ref 0.00–0.07)
Basophils Absolute: 0.1 K/uL (ref 0.0–0.1)
Basophils Relative: 0 %
Eosinophils Absolute: 0 K/uL (ref 0.0–0.5)
Eosinophils Relative: 0 %
HCT: 40.8 % (ref 39.0–52.0)
Hemoglobin: 14.2 g/dL (ref 13.0–17.0)
Immature Granulocytes: 1 %
Lymphocytes Relative: 8 %
Lymphs Abs: 1.2 K/uL (ref 0.7–4.0)
MCH: 32.6 pg (ref 26.0–34.0)
MCHC: 34.8 g/dL (ref 30.0–36.0)
MCV: 93.6 fL (ref 80.0–100.0)
Monocytes Absolute: 1 K/uL (ref 0.1–1.0)
Monocytes Relative: 7 %
Neutro Abs: 12.1 K/uL — ABNORMAL HIGH (ref 1.7–7.7)
Neutrophils Relative %: 84 %
Platelets: 219 K/uL (ref 150–400)
RBC: 4.36 MIL/uL (ref 4.22–5.81)
RDW: 14 % (ref 11.5–15.5)
WBC: 14.5 K/uL — ABNORMAL HIGH (ref 4.0–10.5)
nRBC: 0 % (ref 0.0–0.2)

## 2024-08-28 LAB — COMPREHENSIVE METABOLIC PANEL WITH GFR
ALT: 27 U/L (ref 0–44)
AST: 37 U/L (ref 15–41)
Albumin: 3.7 g/dL (ref 3.5–5.0)
Alkaline Phosphatase: 81 U/L (ref 38–126)
Anion gap: 12 (ref 5–15)
BUN: 18 mg/dL (ref 8–23)
CO2: 22 mmol/L (ref 22–32)
Calcium: 8.8 mg/dL — ABNORMAL LOW (ref 8.9–10.3)
Chloride: 98 mmol/L (ref 98–111)
Creatinine, Ser: 1.12 mg/dL (ref 0.61–1.24)
GFR, Estimated: >60 mL/min (ref >60)
Glucose, Bld: 168 mg/dL — ABNORMAL HIGH (ref 70–99)
Potassium: 3.8 mmol/L (ref 3.5–5.1)
Sodium: 132 mmol/L — ABNORMAL LOW (ref 135–145)
Total Bilirubin: 1.2 mg/dL (ref 0.0–1.2)
Total Protein: 7.1 g/dL (ref 6.5–8.1)

## 2024-08-28 LAB — RESP PANEL BY RT-PCR (RSV, FLU A&B, COVID)  RVPGX2
Influenza A by PCR: NEGATIVE
Influenza B by PCR: NEGATIVE
Resp Syncytial Virus by PCR: NEGATIVE
SARS Coronavirus 2 by RT PCR: NEGATIVE

## 2024-08-28 MED ORDER — PREDNISONE 20 MG PO TABS: ORAL_TABLET | ORAL | 0 refills | Status: AC

## 2024-08-28 MED ORDER — DOXYCYCLINE HYCLATE 100 MG PO CAPS: 100.0000 mg | ORAL_CAPSULE | Freq: Two times a day (BID) | ORAL | 0 refills | Status: AC

## 2024-08-28 MED ORDER — AMOXICILLIN-POT CLAVULANATE 875-125 MG PO TABS: 1.0000 | ORAL_TABLET | Freq: Two times a day (BID) | ORAL | 0 refills | Status: AC

## 2024-08-28 MED ORDER — DOXYCYCLINE HYCLATE 100 MG PO TABS
100.0000 mg | ORAL_TABLET | Freq: Once | ORAL | Status: AC
  Administered 2024-08-28: 22:00:00 100 mg via ORAL
  Filled 2024-08-28: qty 1

## 2024-08-28 MED ORDER — PREDNISONE 20 MG PO TABS
60.0000 mg | ORAL_TABLET | Freq: Once | ORAL | Status: AC
  Administered 2024-08-28: 21:00:00 60 mg via ORAL
  Filled 2024-08-28: qty 3

## 2024-08-28 MED ORDER — AMOXICILLIN-POT CLAVULANATE 875-125 MG PO TABS
1.0000 | ORAL_TABLET | Freq: Once | ORAL | Status: AC
  Administered 2024-08-28: 22:00:00 1 via ORAL
  Filled 2024-08-28: qty 1

## 2024-08-28 NOTE — Telephone Encounter (Signed)
 FYI Only or Action Required?: FYI only for provider: ED advised.  Patient is followed in Pulmonology for asthma with COPD, last seen on 06/27/2024 by Malachy Comer GAILS, NP.  Called Nurse Triage reporting Shortness of Breath.  Symptoms began several days ago.   Triage Disposition: Call EMS 911 Now  Patient/caregiver understands and will follow disposition?: Yes                 Copied from CRM #[id number]. Topic: Clinical - Red Word Triage >> Aug 28, 2024 12:09 PM [name] [name] wrote: Red Word that prompted transfer to Nurse Triage: pt w/ COPD having worsening SOB for the past 3-4 days - when speaking pt's speech is labored - he also mentioned having [name] productive cough Reason for Disposition  SEVERE difficulty breathing (e.g., struggling for each breath, speaks in single words)  Answer Assessment - Initial Assessment Questions Pt audibly struggling to breath upon warm transfer. This RN recommends pt goes to ED via ambulance. Pt refused ambulance, stating his girlfriend will take him there now.  Protocols used: Breathing Difficulty-[name]-AH

## 2024-08-28 NOTE — ED Provider Notes (Signed)
 [place] EMERGENCY DEPARTMENT AT [facility] Provider Note   CSN: [id number] Arrival date & time: 08/28/24  1710     Patient presents with: Shortness of Breath   [name] [name] is a 73 y.o. male.   HPI 73 year old male presents with shortness of breath. He's been feeling poorly for about 3-4 days. He's had some mild increase in his trach secretions, but no change in color. No fevers, chest pain, leg swelling. He was given some breathing treatments by EMS. He received one here in triage and now states he feels better.   Prior to Admission medications  Medication Sig Start Date End Date Taking? Authorizing Provider  amoxicillin -clavulanate (AUGMENTIN ) 875-125 MG tablet Take 1 tablet by mouth every 12 (twelve) hours. 08/28/24  Yes Freddi Hamilton, MD  doxycycline  (VIBRAMYCIN ) 100 MG capsule Take 1 capsule (100 mg total) by mouth 2 (two) times daily. One po bid x 7 days 08/28/24  Yes Freddi Hamilton, MD  predniSONE  (DELTASONE ) 20 MG tablet 2 tabs po daily x 4 days 08/28/24  Yes Freddi Hamilton, MD  acetaminophen  325 MG tablet Take 2 tablets (650 mg total) by mouth every 6 (six) hours as needed. Patient taking differently: Take 650 mg by mouth every 6 (six) hours as needed for pain (or headaches). 12/28/19   Maczis, Michael M, PA-C  albuterol  (PROVENTIL ) (2.5 MG/3ML) 0.083% nebulizer solution Take 2.5 mg by nebulization 3 (three) times daily as needed for wheezing or shortness of breath.    [provider]  albuterol  (VENTOLIN  HFA) 108 (90 Base) MCG/ACT inhaler Inhale 2 puffs into the lungs every 4 (four) hours as needed for wheezing or shortness of breath.    [provider]  BAYER LOW DOSE 81 MG tablet Take 81 mg by mouth daily. Swallow whole.    [provider]  budesonide  (PULMICORT ) 0.5 MG/2ML nebulizer solution Take 2 mLs (0.5 mg total) by nebulization in the morning and at bedtime. 06/27/24   Cobb, Comer GAILS, NP  budesonide -formoterol  (SYMBICORT )  160-4.5 MCG/ACT inhaler Inhale 2 puffs into the lungs 2 (two) times daily. 09/22/23   [provider]  cetirizine (ZYRTEC) 10 MG tablet Take 10 mg by mouth daily.    [provider]  cyclobenzaprine (FLEXERIL) 10 MG tablet Take 10 mg by mouth daily as needed for muscle spasms.    [provider]  finasteride  (PROSCAR ) 5 MG tablet Take 5 mg by mouth daily.    [provider]  formoterol  (PERFOROMIST ) 20 MCG/2ML nebulizer solution Take 2 mLs (20 mcg total) by nebulization 2 (two) times daily. 06/27/24   Cobb, Comer GAILS, NP  guaiFENesin  (MUCINEX ) 600 MG 12 hr tablet Take 600 mg by mouth 2 (two) times daily.    [provider]  hydrochlorothiazide  (HYDRODIURIL ) 25 MG tablet Take 1 tablet (25 mg total) by mouth daily. 05/30/18   Patel, Pranav M, MD  ipratropium-albuterol  (DUONEB) 0.5-2.5 (3) MG/3ML SOLN Take 3 mLs by nebulization every 6 (six) hours as needed. Patient taking differently: Take 3 mLs by nebulization See admin instructions. Nebulize 3 ml's and inhale into the lungs every 2-4 hours as needed for shortness of breath or wheezing 11/01/23   Cobb, Comer GAILS, NP  losartan  (COZAAR ) 50 MG tablet Take 25 mg by mouth in the morning.    [provider]  montelukast  (SINGULAIR ) 10 MG tablet TAKE 1 TABLET BY MOUTH DAILY 06/29/24   Cobb, Comer GAILS, NP  Multiple Vitamins-Minerals (ONE-A-DAY MENS 50+) TABS Take 1 tablet  by mouth daily with breakfast.    [provider]  potassium chloride  SA (KLOR-CON  M) 20 MEQ tablet Take 20 mEq by mouth daily.    [provider]  revefenacin  (YUPELRI ) 175 MCG/3ML nebulizer solution Take 3 mLs (175 mcg total) by nebulization daily. 06/27/24   Cobb, Comer GAILS, NP  rosuvastatin  (CRESTOR ) 5 MG tablet Take 5 mg by mouth daily. 10/05/23   [provider]  sodium chloride  0.9 % nebulizer solution Take 3 mLs by nebulization daily as needed (Use 3 ml's or 3-4 drops into trach to loosen phlegm  suction secretions or as otherwise instructed). 08/17/19   [provider]  terazosin  (HYTRIN ) 5 MG capsule Take 5 mg by mouth at bedtime.    [provider]  vardenafil (LEVITRA) 20 MG tablet Take 20 mg by mouth daily as needed for erectile dysfunction.    [provider]    Allergies: Flonase  [fluticasone ]    Review of Systems  Constitutional:  Negative for fever.  Respiratory:  Positive for shortness of breath.   Cardiovascular:  Negative for chest pain and leg swelling.    Updated Vital Signs BP 109/70 (BP Location: Right Arm)   Pulse 95   Temp 98.2 F (36.8 C) (Oral)   Resp 18   SpO2 94%   Physical Exam Vitals and nursing note reviewed.  Constitutional:      Appearance: He is well-developed.  HENT:     Head: Normocephalic and atraumatic.  Cardiovascular:     Rate and Rhythm: Normal rate and regular rhythm.     Heart sounds: Normal heart sounds.     Comments: HR~100 Pulmonary:     Effort: Pulmonary effort is normal. No tachypnea or accessory muscle usage.     Breath sounds: Normal breath sounds. No wheezing.  Abdominal:     Palpations: Abdomen is soft.     Tenderness: There is no abdominal tenderness.  Musculoskeletal:     Right lower leg: No edema.     Left lower leg: No edema.  Skin:    General: Skin is warm and dry.  Neurological:     Mental Status: He is alert.     (all labs ordered are listed, but only abnormal results are displayed) Labs Reviewed  COMPREHENSIVE METABOLIC PANEL WITH GFR - Abnormal; Notable for the following components:      Result Value   Sodium 132 (*)    Glucose, Bld 168 (*)    Calcium  8.8 (*)    All other components within normal limits  CBC WITH DIFFERENTIAL/PLATELET - Abnormal; Notable for the following components:   WBC 14.5 (*)    Neutro Abs 12.1 (*)    Abs Immature Granulocytes 0.09 (*)    All other components within normal limits  RESP PANEL BY RT-PCR (RSV, FLU A&B, COVID)  [id number]    EKG: EKG  Interpretation Date/Time:  Monday August 28 2024 21:18:38 EST Ventricular Rate:  91 PR Interval:  157 QRS Duration:  89 QT Interval:  330 QTC Calculation: 406 R Axis:   72  Text Interpretation: Sinus rhythm Ventricular premature complex Probable left atrial enlargement Anteroseptal infarct, old no significant change since Oct 2025 Confirmed by Freddi Hamilton [phone]) on 08/28/2024 9:20:59 PM  Radiology: DG Chest 2 View Result Date: 08/28/2024 EXAM: 2 VIEW(S) XRAY OF THE CHEST 08/28/2024 08:23:00 PM COMPARISON: None available. CLINICAL HISTORY: cough cough FINDINGS: LINES, TUBES AND DEVICES: The tip of the tracheostomy is 5.3 cm above the carina. LUNGS AND PLEURA:  There is a small amount of airspace disease in the right middle lobe and right lower lobe. No pleural effusion. No pneumothorax. HEART AND MEDIASTINUM: No acute abnormality of the cardiac and mediastinal silhouettes. BONES AND SOFT TISSUES: There are healed left sided rib fractures. There is a healed left clavicular fracture. IMPRESSION: 1. Small foci of airspace disease in the right middle and right lower lobes, compatible with pneumonia or aspiration pneumonitis. 2. No pleural effusion or pneumothorax. Electronically signed by: Greig Pique MD 08/28/2024 08:27 PM EST RP Workstation: [id number]     Procedures   Medications Ordered in the ED  predniSONE  (DELTASONE ) tablet 60 mg (60 mg Oral Given 08/28/24 2059)  amoxicillin -clavulanate (AUGMENTIN ) 875-125 MG per tablet 1 tablet (1 tablet Oral Given 08/28/24 2137)  doxycycline  (VIBRA -TABS) tablet 100 mg (100 mg Oral Given 08/28/24 2137)                                    Medical Decision Making Amount and/or Complexity of Data Reviewed Labs: ordered.    Details: Leukocytosis of 14 Radiology: ordered and independent interpretation performed.    Details: Mild pneumonia ECG/medicine tests: ordered and independent interpretation performed.    Details: Sinus  rhythm  Risk Prescription drug management.   Patient presents with shortness of breath.  Appears to have some mild pneumonia on x-ray.  On my exam he has clear lungs and is feeling quite well.  He does have some increased secretions but feels fine and feels well and up for discharge.  He does have a leukocytosis, but at this point, with his now normal vital signs (tachycardia has resolved, I think he is stable for discharge with outpatient antibiotics.  Will give steroids given his known COPD history with wheezing that he describes.  Will discharge home with return precautions.     Final diagnoses:  Community acquired pneumonia of right lower lobe of lung    ED Discharge Orders          Ordered    amoxicillin -clavulanate (AUGMENTIN ) 875-125 MG tablet  Every 12 hours        08/28/24 2115    doxycycline  (VIBRAMYCIN ) 100 MG capsule  2 times daily        08/28/24 2115    predniSONE  (DELTASONE ) 20 MG tablet        08/28/24 2115               Freddi Hamilton, MD 08/28/24 [phone]

## 2024-08-28 NOTE — ED Provider Triage Note (Signed)
 Emergency Medicine Provider Triage Evaluation Note  Akil Hoos , a 73 y.o. male  was evaluated in triage.  Pt complains of cough and shortness of breath.  Sounds like a recurrent COPD exacerbation to him.  He has got white thick mucus coming out of his trach.  Feels a lot better after the breathing treatment given in triage prior to me seeing him.  Review of Systems  Positive: Shortness of breath, sputum change Negative: Fever, chest pain  Physical Exam  BP 120/76 (BP Location: Right Arm)   Pulse (!) 111   Temp 99.2 F (37.3 C) (Oral)   Resp 18   SpO2 100%  Gen:   Awake, no distress   Resp:  Normal effort, no significant wheezing at this time Cardiac:  Tachycardic   Medical Decision Making  Medically screening exam initiated at 5:32 PM.  Appropriate orders placed.  Dallas Skillern was informed that the remainder of the evaluation will be completed by another provider, this initial triage assessment does not replace that evaluation, and the importance of remaining in the ED until their evaluation is complete.  X-ray, EKG, labs ordered.   Freddi Hamilton, MD 08/28/24 [phone]

## 2024-08-28 NOTE — ED Triage Notes (Signed)
 Patient from home via EMS for eval of sob x 4 days. Taking home neb treatments without relief.  5mg  albuterol  given by FD and duoneb given by EMS. Suctioning copious amounts of thick, white mucous from trach per EMS. Denies fevers.

## 2024-08-28 NOTE — Discharge Instructions (Addendum)
 Your x-ray today is concerning for pneumonia.  We are putting you on antibiotics and steroids to help treat this.  Continue to use your breathing treatments as needed.  If you develop new or worsening shortness of breath, coughing up blood, fever, or any other new/concerning symptoms then return to the ER.

## 2024-10-18 ENCOUNTER — Emergency Department (HOSPITAL_COMMUNITY)

## 2024-10-18 ENCOUNTER — Encounter (HOSPITAL_COMMUNITY): Payer: Self-pay

## 2024-10-18 ENCOUNTER — Inpatient Hospital Stay (HOSPITAL_COMMUNITY)
Admission: EM | Admit: 2024-10-18 | Source: Home / Self Care | Attending: Pulmonary Disease | Admitting: Pulmonary Disease

## 2024-10-18 ENCOUNTER — Inpatient Hospital Stay (HOSPITAL_COMMUNITY)

## 2024-10-18 ENCOUNTER — Other Ambulatory Visit: Payer: Self-pay

## 2024-10-18 DIAGNOSIS — J9601 Acute respiratory failure with hypoxia: Secondary | ICD-10-CM | POA: Diagnosis not present

## 2024-10-18 DIAGNOSIS — J441 Chronic obstructive pulmonary disease with (acute) exacerbation: Principal | ICD-10-CM | POA: Diagnosis present

## 2024-10-18 DIAGNOSIS — I10 Essential (primary) hypertension: Secondary | ICD-10-CM | POA: Diagnosis not present

## 2024-10-18 DIAGNOSIS — E785 Hyperlipidemia, unspecified: Secondary | ICD-10-CM | POA: Diagnosis not present

## 2024-10-18 DIAGNOSIS — F419 Anxiety disorder, unspecified: Secondary | ICD-10-CM

## 2024-10-18 DIAGNOSIS — N400 Enlarged prostate without lower urinary tract symptoms: Secondary | ICD-10-CM | POA: Diagnosis not present

## 2024-10-18 DIAGNOSIS — Z930 Tracheostomy status: Secondary | ICD-10-CM | POA: Diagnosis not present

## 2024-10-18 DIAGNOSIS — R0902 Hypoxemia: Secondary | ICD-10-CM

## 2024-10-18 LAB — CBC WITH DIFFERENTIAL/PLATELET
Abs Immature Granulocytes: 0.05 10*3/uL (ref 0.00–0.07)
Basophils Absolute: 0.1 10*3/uL (ref 0.0–0.1)
Basophils Relative: 1 %
Eosinophils Absolute: 2.5 10*3/uL — ABNORMAL HIGH (ref 0.0–0.5)
Eosinophils Relative: 23 %
HCT: 42.8 % (ref 39.0–52.0)
Hemoglobin: 14.6 g/dL (ref 13.0–17.0)
Immature Granulocytes: 1 %
Lymphocytes Relative: 21 %
Lymphs Abs: 2.3 10*3/uL (ref 0.7–4.0)
MCH: 31.9 pg (ref 26.0–34.0)
MCHC: 34.1 g/dL (ref 30.0–36.0)
MCV: 93.4 fL (ref 80.0–100.0)
Monocytes Absolute: 0.9 10*3/uL (ref 0.1–1.0)
Monocytes Relative: 8 %
Neutro Abs: 5.1 10*3/uL (ref 1.7–7.7)
Neutrophils Relative %: 46 %
Platelets: 262 10*3/uL (ref 150–400)
RBC: 4.58 MIL/uL (ref 4.22–5.81)
RDW: 13.1 % (ref 11.5–15.5)
Smear Review: NORMAL
WBC: 10.9 10*3/uL — ABNORMAL HIGH (ref 4.0–10.5)
nRBC: 0 % (ref 0.0–0.2)

## 2024-10-18 LAB — URINALYSIS, W/ REFLEX TO CULTURE (INFECTION SUSPECTED)
Bacteria, UA: NONE SEEN
Bilirubin Urine: NEGATIVE
Glucose, UA: NEGATIVE mg/dL
Hgb urine dipstick: NEGATIVE
Ketones, ur: 5 mg/dL — AB
Leukocytes,Ua: NEGATIVE
Nitrite: NEGATIVE
Protein, ur: NEGATIVE mg/dL
Specific Gravity, Urine: 1.009 (ref 1.005–1.030)
pH: 5 (ref 5.0–8.0)

## 2024-10-18 LAB — COMPREHENSIVE METABOLIC PANEL WITH GFR
ALT: 29 U/L (ref 0–44)
AST: 32 U/L (ref 15–41)
Albumin: 4.3 g/dL (ref 3.5–5.0)
Alkaline Phosphatase: 89 U/L (ref 38–126)
Anion gap: 12 (ref 5–15)
BUN: 9 mg/dL (ref 8–23)
CO2: 22 mmol/L (ref 22–32)
Calcium: 9.2 mg/dL (ref 8.9–10.3)
Chloride: 101 mmol/L (ref 98–111)
Creatinine, Ser: 0.75 mg/dL (ref 0.61–1.24)
GFR, Estimated: >60 mL/min
Glucose, Bld: 113 mg/dL — ABNORMAL HIGH (ref 70–99)
Potassium: 4.4 mmol/L (ref 3.5–5.1)
Sodium: 135 mmol/L (ref 135–145)
Total Bilirubin: 0.5 mg/dL (ref 0.0–1.2)
Total Protein: 7.8 g/dL (ref 6.5–8.1)

## 2024-10-18 LAB — RESP PANEL BY RT-PCR (RSV, FLU A&B, COVID)  RVPGX2
Influenza A by PCR: NEGATIVE
Influenza B by PCR: NEGATIVE
Resp Syncytial Virus by PCR: NEGATIVE
SARS Coronavirus 2 by RT PCR: NEGATIVE

## 2024-10-18 LAB — GLUCOSE, CAPILLARY
Glucose-Capillary: 121 mg/dL — ABNORMAL HIGH (ref 70–99)
Glucose-Capillary: 123 mg/dL — ABNORMAL HIGH (ref 70–99)
Glucose-Capillary: 129 mg/dL — ABNORMAL HIGH (ref 70–99)
Glucose-Capillary: 133 mg/dL — ABNORMAL HIGH (ref 70–99)
Glucose-Capillary: 157 mg/dL — ABNORMAL HIGH (ref 70–99)
Glucose-Capillary: 160 mg/dL — ABNORMAL HIGH (ref 70–99)

## 2024-10-18 LAB — RESPIRATORY PANEL BY PCR

## 2024-10-18 LAB — PATHOLOGIST SMEAR REVIEW

## 2024-10-18 LAB — I-STAT CG4 LACTIC ACID, ED
Lactic Acid, Venous: 1.1 mmol/L (ref 0.5–1.9)
Lactic Acid, Venous: 1.5 mmol/L (ref 0.5–1.9)

## 2024-10-18 LAB — CBG MONITORING, ED: Glucose-Capillary: 136 mg/dL — ABNORMAL HIGH (ref 70–99)

## 2024-10-18 LAB — PROTIME-INR
INR: 1.1 (ref 0.8–1.2)
Prothrombin Time: 15 s (ref 11.4–15.2)

## 2024-10-18 LAB — MRSA NEXT GEN BY PCR, NASAL: MRSA by PCR Next Gen: NOT DETECTED

## 2024-10-18 MED ORDER — LACTATED RINGERS IV BOLUS
1000.0000 mL | Freq: Once | INTRAVENOUS | Status: AC
  Administered 2024-10-18: 1000 mL via INTRAVENOUS

## 2024-10-18 MED ORDER — FENTANYL CITRATE (PF) 50 MCG/ML IJ SOSY
12.5000 ug | PREFILLED_SYRINGE | INTRAMUSCULAR | Status: DC | PRN
  Administered 2024-10-18 – 2024-10-19 (×2): 25 ug via INTRAVENOUS
  Filled 2024-10-18 (×2): qty 1

## 2024-10-18 MED ORDER — METHYLPREDNISOLONE SODIUM SUCC 125 MG IJ SOLR
125.0000 mg | Freq: Once | INTRAMUSCULAR | Status: AC
  Administered 2024-10-18: 125 mg via INTRAVENOUS
  Filled 2024-10-18: qty 2

## 2024-10-18 MED ORDER — SODIUM CHLORIDE 0.9 % IV SOLN
2.0000 g | Freq: Once | INTRAVENOUS | Status: AC
  Administered 2024-10-18: 2 g via INTRAVENOUS
  Filled 2024-10-18: qty 20

## 2024-10-18 MED ORDER — INSULIN ASPART 100 UNIT/ML IJ SOLN
0.0000 [IU] | INTRAMUSCULAR | Status: DC
  Administered 2024-10-18: 2 [IU] via SUBCUTANEOUS
  Administered 2024-10-18 (×2): 1 [IU] via SUBCUTANEOUS
  Administered 2024-10-18: 2 [IU] via SUBCUTANEOUS
  Administered 2024-10-18 – 2024-10-19 (×2): 1 [IU] via SUBCUTANEOUS
  Administered 2024-10-19: 2 [IU] via SUBCUTANEOUS
  Filled 2024-10-18: qty 1
  Filled 2024-10-18 (×4): qty 2
  Filled 2024-10-18 (×2): qty 1

## 2024-10-18 MED ORDER — PANTOPRAZOLE SODIUM 40 MG IV SOLR
40.0000 mg | Freq: Every day | INTRAVENOUS | Status: DC
  Administered 2024-10-18 – 2024-10-19 (×2): 40 mg via INTRAVENOUS
  Filled 2024-10-18 (×2): qty 10

## 2024-10-18 MED ORDER — HYDRALAZINE HCL 20 MG/ML IJ SOLN: 10.0000 mg | INTRAMUSCULAR | Status: AC | PRN

## 2024-10-18 MED ORDER — CHLORHEXIDINE GLUCONATE CLOTH 2 % EX PADS
6.0000 | MEDICATED_PAD | Freq: Every day | CUTANEOUS | Status: AC
  Administered 2024-10-18 – 2024-10-20 (×3): 6 via TOPICAL

## 2024-10-18 MED ORDER — PROPOFOL 1000 MG/100ML IV EMUL
0.0000 ug/kg/min | INTRAVENOUS | Status: DC
  Administered 2024-10-18: 5 ug/kg/min via INTRAVENOUS
  Filled 2024-10-18: qty 100

## 2024-10-18 MED ORDER — ONDANSETRON HCL 4 MG/2ML IJ SOLN: 4.0000 mg | Freq: Four times a day (QID) | INTRAMUSCULAR | Status: AC | PRN

## 2024-10-18 MED ORDER — SODIUM CHLORIDE 0.9 % IV SOLN
1.0000 g | Freq: Once | INTRAVENOUS | Status: DC
  Filled 2024-10-18: qty 10

## 2024-10-18 MED ORDER — REVEFENACIN 175 MCG/3ML IN SOLN
175.0000 ug | Freq: Every day | RESPIRATORY_TRACT | Status: AC
  Administered 2024-10-18 – 2024-10-20 (×3): 175 ug via RESPIRATORY_TRACT
  Filled 2024-10-18 (×3): qty 3

## 2024-10-18 MED ORDER — ACETAMINOPHEN 650 MG RE SUPP
650.0000 mg | Freq: Once | RECTAL | Status: AC
  Administered 2024-10-18: 650 mg via RECTAL
  Filled 2024-10-18: qty 1

## 2024-10-18 MED ORDER — INSULIN ASPART 100 UNIT/ML IJ SOLN: 0.0000 [IU] | INTRAMUSCULAR | Status: DC

## 2024-10-18 MED ORDER — BISACODYL 10 MG RE SUPP
10.0000 mg | Freq: Once | RECTAL | Status: AC
  Administered 2024-10-18: 10 mg via RECTAL
  Filled 2024-10-18: qty 1

## 2024-10-18 MED ORDER — HEPARIN SODIUM (PORCINE) 5000 UNIT/ML IJ SOLN
5000.0000 [IU] | Freq: Three times a day (TID) | INTRAMUSCULAR | Status: DC
  Administered 2024-10-18 – 2024-10-19 (×3): 5000 [IU] via SUBCUTANEOUS
  Filled 2024-10-18 (×3): qty 1

## 2024-10-18 MED ORDER — SODIUM CHLORIDE 0.9 % IV SOLN: 500.0000 mg | INTRAVENOUS | Status: DC

## 2024-10-18 MED ORDER — ALBUTEROL SULFATE (2.5 MG/3ML) 0.083% IN NEBU
10.0000 mg/h | INHALATION_SOLUTION | Freq: Once | RESPIRATORY_TRACT | Status: AC
  Administered 2024-10-18: 10 mg/h via RESPIRATORY_TRACT
  Filled 2024-10-18: qty 12

## 2024-10-18 MED ORDER — LACTATED RINGERS IV SOLN: INTRAVENOUS | Status: AC

## 2024-10-18 MED ORDER — ADULT MULTIVITAMIN W/MINERALS CH
1.0000 | ORAL_TABLET | Freq: Every day | ORAL | Status: AC
  Administered 2024-10-18 – 2024-10-20 (×3): 1
  Filled 2024-10-18 (×3): qty 1

## 2024-10-18 MED ORDER — SODIUM CHLORIDE 0.9 % IV SOLN: 1.0000 g | INTRAVENOUS | Status: DC

## 2024-10-18 MED ORDER — HYDROCHLOROTHIAZIDE 25 MG PO TABS
25.0000 mg | ORAL_TABLET | Freq: Every day | ORAL | Status: AC
  Administered 2024-10-18 – 2024-10-20 (×3): 25 mg via ORAL
  Filled 2024-10-18 (×3): qty 1

## 2024-10-18 MED ORDER — INFLUENZA VAC SPLIT HIGH-DOSE 0.5 ML IM SUSY: 0.5000 mL | PREFILLED_SYRINGE | INTRAMUSCULAR | Status: AC | PRN

## 2024-10-18 MED ORDER — ORAL CARE MOUTH RINSE
15.0000 mL | OROMUCOSAL | Status: DC
  Administered 2024-10-18 (×3): 15 mL via OROMUCOSAL

## 2024-10-18 MED ORDER — SENNA 8.6 MG PO TABS
1.0000 | ORAL_TABLET | Freq: Two times a day (BID) | ORAL | Status: AC | PRN
  Administered 2024-10-18: 8.6 mg via ORAL
  Filled 2024-10-18: qty 1

## 2024-10-18 MED ORDER — SODIUM CHLORIDE 0.9 % IV SOLN
100.0000 mg | Freq: Two times a day (BID) | INTRAVENOUS | Status: DC
  Administered 2024-10-19: 100 mg via INTRAVENOUS
  Filled 2024-10-18: qty 100

## 2024-10-18 MED ORDER — LOSARTAN POTASSIUM 50 MG PO TABS
50.0000 mg | ORAL_TABLET | Freq: Every day | ORAL | Status: AC
  Administered 2024-10-18 – 2024-10-20 (×3): 50 mg via ORAL
  Filled 2024-10-18 (×4): qty 1

## 2024-10-18 MED ORDER — FENTANYL CITRATE (PF) 50 MCG/ML IJ SOSY
25.0000 ug | PREFILLED_SYRINGE | Freq: Once | INTRAMUSCULAR | Status: AC
  Administered 2024-10-18: 50 ug via INTRAVENOUS
  Filled 2024-10-18: qty 1

## 2024-10-18 MED ORDER — POLYETHYLENE GLYCOL 3350 17 G PO PACK
17.0000 g | PACK | Freq: Every day | ORAL | Status: AC | PRN
  Administered 2024-10-18: 17 g via ORAL
  Filled 2024-10-18: qty 1

## 2024-10-18 MED ORDER — SODIUM CHLORIDE 0.9 % IV SOLN: 2.0000 g | INTRAVENOUS | Status: DC

## 2024-10-18 MED ORDER — ORAL CARE MOUTH RINSE: 15.0000 mL | OROMUCOSAL | Status: DC | PRN

## 2024-10-18 MED ORDER — ACETAMINOPHEN 325 MG PO TABS
650.0000 mg | ORAL_TABLET | Freq: Four times a day (QID) | ORAL | Status: AC | PRN
  Administered 2024-10-18 – 2024-10-19 (×2): 650 mg via ORAL
  Filled 2024-10-18 (×2): qty 2

## 2024-10-18 MED ORDER — SODIUM CHLORIDE 0.9 % IV SOLN
500.0000 mg | Freq: Once | INTRAVENOUS | Status: AC
  Administered 2024-10-18: 500 mg via INTRAVENOUS
  Filled 2024-10-18: qty 5

## 2024-10-18 MED ORDER — METHYLPREDNISOLONE SODIUM SUCC 125 MG IJ SOLR
80.0000 mg | Freq: Two times a day (BID) | INTRAMUSCULAR | Status: DC
  Administered 2024-10-18 – 2024-10-19 (×2): 80 mg via INTRAVENOUS
  Filled 2024-10-18 (×2): qty 2

## 2024-10-18 MED ORDER — ORAL CARE MOUTH RINSE: 15.0000 mL | OROMUCOSAL | Status: AC | PRN

## 2024-10-18 MED ORDER — BUDESONIDE 0.25 MG/2ML IN SUSP
0.2500 mg | Freq: Two times a day (BID) | RESPIRATORY_TRACT | Status: DC
  Administered 2024-10-18 – 2024-10-19 (×3): 0.25 mg via RESPIRATORY_TRACT
  Filled 2024-10-18 (×3): qty 2

## 2024-10-18 MED ORDER — ARFORMOTEROL TARTRATE 15 MCG/2ML IN NEBU
15.0000 ug | INHALATION_SOLUTION | Freq: Two times a day (BID) | RESPIRATORY_TRACT | Status: AC
  Administered 2024-10-18 – 2024-10-20 (×6): 15 ug via RESPIRATORY_TRACT
  Filled 2024-10-18 (×6): qty 2

## 2024-10-18 MED ORDER — IPRATROPIUM-ALBUTEROL 0.5-2.5 (3) MG/3ML IN SOLN
3.0000 mL | Freq: Four times a day (QID) | RESPIRATORY_TRACT | Status: DC | PRN
  Administered 2024-10-18: 3 mL via RESPIRATORY_TRACT
  Filled 2024-10-18: qty 3

## 2024-10-18 NOTE — ED Triage Notes (Signed)
 Pt BIB [facility] with c/o SOB that started yesterday. Pt has trach and usually on RA. Pt attempted deep suction at home. EMS RA stat was 90%. Pt had trach changed 3 days ago.    Duoneb x2 2g mag 125mg  solumedrol 18G L AC  155/88 HR 100

## 2024-10-18 NOTE — Progress Notes (Signed)
 Initial Nutrition Assessment  DOCUMENTATION CODES:  Not applicable  INTERVENTION:  Tube feeds via NG-tube/Cortrak once tube is placed: Osmolite 1.5 at 45 mL/hr (1080 mL per day) Start at 20 mL/hr and advance by 10 mL every 6 hours to goal rate of 45 mL/hr  60 mL ProSource TF20 - Daily Regimen at goal provides 1700 calories, 88 gm protein, and 823 mL free water  daily.  Monitor magnesium , potassium, and phosphorus daily for at least 3 days, MD to replete as needed, as pt is at risk for refeeding syndrome given unknown PO intake prior to admission and known weight loss. Multivitamin w/ minerals daily  NUTRITION DIAGNOSIS:  Inadequate oral intake related to inability to eat as evidenced by NPO status.  GOAL:  Patient will meet greater than or equal to 90% of their needs  MONITOR:  Vent status, Labs, Weight trends, I & O's  REASON FOR ASSESSMENT:  Ventilator   ASSESSMENT:  74 y.o. male presented to the ED with shortness of breath and low saturations on room air via trach at home. PMH includes BPH, HTN, chronic trach, HLD, and anxiety. Pt admitted with acute hypoxic respiratory failure, placed on the vent upon admission.   2/04 - Admitted; placed on vent; Cortrak placed   Patient is currently on ventilator support via trach. Patient able to provide some information regarding nutrition history at home. RD limited to asking yes or no questions. Patient reports a good appetite and eating well at home prior to admission. Denies any nausea or vomiting.   Discussed starting feeds with CCM, ok to start feeds. Shortly after receiving ok about starting feeds, RN notified team that patient has coughed out Cortrak tube. Pending replacement by Cortrak team.   MV: 9.6 L/min MAP (cuff): 102 Temp (24hrs), Avg:97.9 F (36.6 C), Min:97 F (36.1 C), Max:98.7 F (37.1 C)  Admission/Current Weight: 68 kg Patient denies recent weight loss and endorses weight going up recently. Per chart review,  patient with a 12.8% weight loss over the past 10 months, this is not clinically significant. Weight history also does not show weight gain, but is also no history within the past 4 months documented.   Nutrition Related Medications: Doculax, NovoLog  0-9 units q4h, Solu-medrol , Protonix  Drips Doxycycline  LR at 75 Ml/hr Propofol  (stopped) Labs: Sodium 135, Potassium 4.4 CBG: 123-136 mg/dL x 24 hrs   NUTRITION - FOCUSED PHYSICAL EXAM:  Flowsheet Row Most Recent Value  Orbital Region No depletion  Upper Arm Region No depletion  Thoracic and Lumbar Region No depletion  Buccal Region No depletion  Temple Region Mild depletion  Clavicle Bone Region Mild depletion  Clavicle and Acromion Bone Region No depletion  Scapular Bone Region No depletion  Dorsal Hand Mild depletion  Patellar Region No depletion  Anterior Thigh Region No depletion  Posterior Calf Region No depletion  Edema (RD Assessment) None  Hair Reviewed  Eyes Reviewed  Mouth Reviewed  Skin Reviewed  Nails Reviewed   Diet Order:   Diet Order             Diet NPO time specified  Diet effective now                  EDUCATION NEEDS: No education needs have been identified at this time  Skin:  Skin Assessment: Reviewed RN Assessment  Last BM:  2/3  Height:  Ht Readings from Last 1 Encounters:  10/18/24 5' 5 (1.651 m)   Weight:  Wt Readings from Last 1 Encounters:  10/18/24 68 kg   Ideal Body Weight:  61.8 kg  BMI:  Body mass index is 24.96 kg/m.  Estimated Nutritional Needs:  Kcal:  1700-1900 Protein:  80-100 grams Fluid:  >/= 1.7 L   Nestora Glatter RD, LDN Registered Dietitian I Please see AMION for contact information

## 2024-10-18 NOTE — Progress Notes (Signed)
 Patient taken off of ventilator, cuff deflated and placed on ATC 6L 28%. Patient is on room air at home and expressed that is what he prefers. ATC is at bedside with patient for humidity/O2 as needed, patient applied his home PMV and is tolerating it well.

## 2024-10-18 NOTE — ED Notes (Signed)
 CCMD notified

## 2024-10-18 NOTE — TOC Initial Note (Signed)
 Transition of Care [facility]) - Initial/Assessment Note    Patient Details  Name: [name] [name] MRN: [id number] Date of Birth: [birth year]  [facility] of [facility]) CM/SW Contact:    Marval Gell, RN Phone Number: 10/18/2024, 2:43 PM  Clinical Narrative:                  Had the pleasure of speaking with the patient at bedside. He was comfortable sitting up in chair. He asked that I report admission to the [facility] for him. I have done this through their portal, confirmation number is [phone] He states that the [facility] supplies all his trach supplies, and he has the supplies he needs at home.  He independently cared for his trach prior to admission, anticipate he will be able to continue to do so.  He goes to the [facility] and his primary care there is Hezekiah Schimke MD.   Expected Discharge Plan: Home/Self Care Barriers to Discharge: Continued Medical Work up   Patient Goals and CMS Choice Patient states their goals for this hospitalization and ongoing recovery are:: to go home          Expected Discharge Plan and Services   Discharge Planning Services: CM Consult   Living arrangements for the past 2 months: Single Family Home                 DME Arranged: N/A           HH Agency: NA        Prior Living Arrangements/Services Living arrangements for the past 2 months: Single Family Home Lives with:: Significant Other                   Activities of Daily Living      Permission Sought/Granted                  Emotional Assessment              Admission diagnosis:  Hypoxia [R09.02] COPD exacerbation (HCC) [J44.1] Acute hypoxemic respiratory failure (HCC) [J96.01] Patient Active Problem List   Diagnosis Date Noted   Acute hypoxemic respiratory failure (HCC) 10/18/2024   Bronchiectasis without complication (HCC) 06/28/2024   Acute bronchitis due to Rhinovirus 06/19/2024   COPD exacerbation (HCC) 07/02/2023   Acquired clawfoot, 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   Dry eye syndrome of bilateral lacrimal glands 03/20/2021   Enthesopathy of ankle and tarsus 03/20/2021   Pain in joint, ankle and foot 03/20/2021   Pain in left foot 03/20/2021   Pain in soft tissues of limb 03/20/2021   H/O chronic respiratory failure    H/O tracheostomy    Tracheostomy dependent (HCC)    Dysphonia 09/29/2018   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   DOE (dyspnea on exertion) 07/14/2018   Chronic sinusitis 06/07/2018   Asthma with COPD (HCC) 03/06/2018   Solitary pulmonary nodule 01/16/2018   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   Vocal cord paralysis, 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   Tracheostomy care (HCC) 12/17/2016   Stridor 11/25/2016   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 [place] [address] Phone: [phone] Fax: [phone]  [facility] [place] - [address] [place] [address] Phone: [phone] Fax: [phone]     Social Drivers of Health (SDOH) Social History: SDOH Screenings   Food Insecurity: No Food Insecurity (06/17/2024)  Housing: Low Risk (06/17/2024)  Transportation Needs: No Transportation Needs (06/17/2024)  Utilities: Not At Risk (06/17/2024)  Social Connections: Socially Integrated (06/17/2024)  Tobacco Use: Low Risk (10/18/2024)   SDOH Interventions:     Readmission Risk Interventions     No data to display
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
## 2024-10-18 NOTE — ED Notes (Signed)
 Pt gave verbal permission to speak with partner Bertha Slade about pt care.
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
## 2024-10-18 NOTE — Procedures (Signed)
 Cortrak  Person Inserting Tube:  Destry Dauber, Olivia SAUNDERS, RD Tube Type:  Cortrak - 43 inches Tube Size:  10 Tube Location:  Left nare Secured by: Bridle Technique Used to Measure Tube Placement:  Marking at nare/corner of mouth Cortrak Secured At:  65 cm Initial Placement Verification:  Xray  Cortrak Tube Team Note:  Consult received to place a Cortrak feeding tube.   X-ray is required. RN may begin using tube after tube placement confirmed.   If the tube becomes dislodged please keep the tube and contact the Cortrak team at [email or web address] for replacement.  If after hours and replacement cannot be delayed, place a NG tube and confirm placement with an abdominal x-ray.    Olivia Kenning, RD Registered Dietitian  See Amion for more information

## 2024-10-18 NOTE — Procedures (Signed)
 Tracheostomy Change Note  Patient Details:   Name: [name] [name] DOB: Dec 31, 1950 MRN: [id number]    Airway Documentation:     Evaluation  O2 sats: stable throughout Complications: No apparent complications Patient did tolerate procedure well. Bilateral Breath Sounds: Inspiratory wheezes, Rhonchi  Changed patient from #6 cuff less shiley to #6 cuffed shiley for ventilator support. Positive color change with easy cap and equal breath sounds. Patient tolerated change well.  Luiz Velinda Craze 10/18/2024, 3:25 AM

## 2024-10-18 NOTE — ED Notes (Signed)
 Ceftriaxone  is infusing through right forearm 20 g IV

## 2024-10-18 NOTE — ED Provider Notes (Signed)
 " [place] EMERGENCY DEPARTMENT AT [facility] Provider Note   CSN: [id number] Arrival date & time: 10/18/24  9742     Patient presents with: Shortness of Breath   [name] [name] is a 74 y.o. male.   74 year old male brought in by EMS secondary to respiratory distress.  Patient has an uncuffed trach and is on room air at baseline.  Patient had a couple days of progressive worsening dyspnea and cough.  No discharge.  EMS started him on albuterol  and brought him here for further evaluation.  Patient denies fevers.  Denies any other symptoms.   Shortness of Breath      Prior to Admission medications  Medication Sig Start Date End Date Taking? Authorizing Provider  acetaminophen  325 MG tablet Take 2 tablets (650 mg total) by mouth every 6 (six) hours as needed. Patient taking differently: Take 650 mg by mouth every 6 (six) hours as needed for pain (or headaches). 12/28/19   Maczis, Michael M, PA-C  albuterol  (PROVENTIL ) (2.5 MG/3ML) 0.083% nebulizer solution Take 2.5 mg by nebulization 3 (three) times daily as needed for wheezing or shortness of breath.    [provider]  albuterol  (VENTOLIN  HFA) 108 (90 Base) MCG/ACT inhaler Inhale 2 puffs into the lungs every 4 (four) hours as needed for wheezing or shortness of breath.    [provider]  amoxicillin -clavulanate (AUGMENTIN ) 875-125 MG tablet Take 1 tablet by mouth every 12 (twelve) hours. 08/28/24   Freddi Hamilton, MD  BAYER LOW DOSE 81 MG tablet Take 81 mg by mouth daily. Swallow whole.    [provider]  budesonide  (PULMICORT ) 0.5 MG/2ML nebulizer solution Take 2 mLs (0.5 mg total) by nebulization in the morning and at bedtime. 06/27/24   Cobb, Comer GAILS, NP  budesonide -formoterol  (SYMBICORT ) 160-4.5 MCG/ACT inhaler Inhale 2 puffs into the lungs 2 (two) times daily. 09/22/23   [provider]  cetirizine (ZYRTEC) 10 MG tablet Take 10 mg by mouth daily.    [provider]   cyclobenzaprine (FLEXERIL) 10 MG tablet Take 10 mg by mouth daily as needed for muscle spasms.    [provider]  doxycycline  (VIBRAMYCIN ) 100 MG capsule Take 1 capsule (100 mg total) by mouth 2 (two) times daily. One po bid x 7 days 08/28/24   Freddi Hamilton, MD  finasteride  (PROSCAR ) 5 MG tablet Take 5 mg by mouth daily.    [provider]  formoterol  (PERFOROMIST ) 20 MCG/2ML nebulizer solution Take 2 mLs (20 mcg total) by nebulization 2 (two) times daily. 06/27/24   Cobb, Comer GAILS, NP  guaiFENesin  (MUCINEX ) 600 MG 12 hr tablet Take 600 mg by mouth 2 (two) times daily.    [provider]  hydrochlorothiazide  (HYDRODIURIL ) 25 MG tablet Take 1 tablet (25 mg total) by mouth daily. 05/30/18   Patel, Pranav M, MD  ipratropium-albuterol  (DUONEB) 0.5-2.5 (3) MG/3ML SOLN Take 3 mLs by nebulization every 6 (six) hours as needed. Patient taking differently: Take 3 mLs by nebulization See admin instructions. Nebulize 3 ml's and inhale into the lungs every 2-4 hours as needed for shortness of breath or wheezing 11/01/23   Cobb, Comer GAILS, NP  losartan  (COZAAR ) 50 MG tablet Take 25 mg by mouth in the morning.    [provider]  montelukast  (SINGULAIR ) 10 MG tablet TAKE 1 TABLET BY MOUTH DAILY 06/29/24   Cobb, Comer GAILS, NP  Multiple Vitamins-Minerals (ONE-A-DAY MENS 50+) TABS Take 1 tablet by mouth daily with breakfast.  [provider]  potassium chloride  SA (KLOR-CON  M) 20 MEQ tablet Take 20 mEq by mouth daily.    [provider]  predniSONE  (DELTASONE ) 20 MG tablet 2 tabs po daily x 4 days 08/28/24   Freddi Hamilton, MD  revefenacin  (YUPELRI ) 175 MCG/3ML nebulizer solution Take 3 mLs (175 mcg total) by nebulization daily. 06/27/24   Cobb, Comer GAILS, NP  rosuvastatin  (CRESTOR ) 5 MG tablet Take 5 mg by mouth daily. 10/05/23   [provider]  sodium chloride  0.9 % nebulizer solution Take 3 mLs by nebulization daily as needed (Use 3 ml's  or 3-4 drops into trach to loosen phlegm suction secretions or as otherwise instructed). 08/17/19   [provider]  terazosin  (HYTRIN ) 5 MG capsule Take 5 mg by mouth at bedtime.    [provider]  vardenafil (LEVITRA) 20 MG tablet Take 20 mg by mouth daily as needed for erectile dysfunction.    [provider]    Allergies: Flonase  [fluticasone ]    Review of Systems  Respiratory:  Positive for shortness of breath.     Updated Vital Signs BP (!) 150/85   Pulse 92   Temp 97.6 F (36.4 C) (Oral)   Resp 20   Ht 5' 5 (1.651 m)   Wt 68 kg   SpO2 100%   BMI 24.96 kg/m   Physical Exam Vitals and nursing note reviewed.  Constitutional:      General: He is in acute distress.     Appearance: He is well-developed. He is diaphoretic.  HENT:     Head: Normocephalic and atraumatic.  Cardiovascular:     Rate and Rhythm: Normal rate.  Pulmonary:     Effort: Tachypnea, accessory muscle usage and respiratory distress present.     Breath sounds: Decreased breath sounds and wheezing present.  Abdominal:     General: There is no distension.  Musculoskeletal:        General: Normal range of motion.     Cervical back: Normal range of motion.  Neurological:     Mental Status: He is alert.     (all labs ordered are listed, but only abnormal results are displayed) Labs Reviewed  COMPREHENSIVE METABOLIC PANEL WITH GFR - Abnormal; Notable for the following components:      Result Value   Glucose, Bld 113 (*)    All other components within normal limits  CBC WITH DIFFERENTIAL/PLATELET - Abnormal; Notable for the following components:   WBC 10.9 (*)    Eosinophils Absolute 2.5 (*)    All other components within normal limits  RESP PANEL BY RT-PCR (RSV, FLU A&B, COVID)  RVPGX2  CULTURE, BLOOD (ROUTINE X 2)  CULTURE, BLOOD (ROUTINE X 2)  MRSA NEXT GEN BY PCR, NASAL  CULTURE, RESPIRATORY W GRAM STAIN  PROTIME-INR  URINALYSIS, W/ REFLEX TO CULTURE (INFECTION  SUSPECTED)  PATHOLOGIST SMEAR REVIEW  STREP PNEUMONIAE URINARY ANTIGEN  LEGIONELLA PNEUMOPHILA SEROGP 1 UR AG  I-STAT CG4 LACTIC ACID, ED  I-STAT CG4 LACTIC ACID, ED    EKG: None  Radiology: DG Chest Portable 1 View Result Date: 10/18/2024 EXAM: 1 VIEW XRAY OF THE CHEST 10/18/2024 03:39:08 AM COMPARISON: Portable chest 08/28/2024. CLINICAL HISTORY: eval for pneumonia Evaluate for pneumonia. FINDINGS: LINES, TUBES AND DEVICES: Tracheostomy cannula with the tip 6.4 cm from the carina, unchanged. Overlying jewelry and telemetry leads. LUNGS AND PLEURA: The lungs are slightly emphysematous but clear, with partial exclusion of the tip of the left costophrenic sulcus. No pleural effusion.  No pneumothorax. HEART AND MEDIASTINUM: The cardiac size is normal. The mediastinum is stable with mild aortic tortuosity with calcification in the transverse segment. BONES AND SOFT TISSUES: Degenerative change, osteopenia and mild dextroscoliosis of the thoracic spine. Chronic healed midshaft left clavicle fracture. IMPRESSION: 1. Stable COPD chest with no acute cardiopulmonary findings. 2. Partial exclusion of the tip of the left costophrenic sulcus. 3. Osteopenia and degenerative change. 4. Stable tracheostomy. Electronically signed by: Francis Quam MD 10/18/2024 03:46 AM EST RP Workstation: [id number]     .Critical Care  Performed by: Lorette Mayo, MD Authorized by: Lorette Mayo, MD   Critical care provider statement:    Critical care time (minutes):  30   Critical care was necessary to treat or prevent imminent or life-threatening deterioration of the following conditions:  Respiratory failure   Critical care was time spent personally by me on the following activities:  Development of treatment plan with patient or surrogate, discussions with consultants, evaluation of patient's response to treatment, examination of patient, ordering and review of laboratory studies, ordering and review of radiographic  studies, ordering and performing treatments and interventions, pulse oximetry, re-evaluation of patient's condition and review of old charts    Medications Ordered in the ED  propofol  (DIPRIVAN ) 1000 MG/100ML infusion (25 mcg/kg/min  68 kg Intravenous Rate/Dose Change 10/18/24 0406)  lactated ringers  infusion ( Intravenous New Bag/Given 10/18/24 0545)  Chlorhexidine  Gluconate Cloth 2 % PADS 6 each (has no administration in time range)  polyethylene glycol (MIRALAX  / GLYCOLAX ) packet 17 g (has no administration in time range)  senna (SENOKOT) tablet 8.6 mg (has no administration in time range)  heparin  injection 5,000 Units (has no administration in time range)  pantoprazole  (PROTONIX ) injection 40 mg (has no administration in time range)  ondansetron  (ZOFRAN ) injection 4 mg (has no administration in time range)  insulin  aspart (novoLOG ) injection 0-9 Units (has no administration in time range)  arformoterol  (BROVANA ) nebulizer solution 15 mcg (has no administration in time range)  budesonide  (PULMICORT ) nebulizer solution 0.25 mg (has no administration in time range)  ipratropium-albuterol  (DUONEB) 0.5-2.5 (3) MG/3ML nebulizer solution 3 mL (has no administration in time range)  albuterol  (PROVENTIL ) (2.5 MG/3ML) 0.083% nebulizer solution (10 mg/hr Nebulization Given by Other 10/18/24 0334)  methylPREDNISolone  sodium succinate (SOLU-MEDROL ) 125 mg/2 mL injection 125 mg (125 mg Intravenous Given 10/18/24 0344)  cefTRIAXone  (ROCEPHIN ) 2 g in sodium chloride  0.9 % 100 mL IVPB (0 g Intravenous Stopped 10/18/24 0434)  azithromycin  (ZITHROMAX ) 500 mg in sodium chloride  0.9 % 250 mL IVPB (0 mg Intravenous Stopped 10/18/24 0540)  lactated ringers  bolus 1,000 mL (0 mLs Intravenous Stopped 10/18/24 0544)                                    Medical Decision Making Amount and/or Complexity of Data Reviewed Labs: ordered. Radiology: ordered.  Risk Prescription drug management. Decision regarding  hospitalization.   Patient with significant work of breathing on arrival here.  Nothing came up on suctioning.  Chest x-ray without evidence of pneumonia.  Suspect likely run-of-the-mill COPD exacerbation however with his work of breathing decide to switch out his trach for a cuffed trach and put on the ventilator.  Low-dose propofol  given for comfort on the ventilator.  Labs reassuring.  Albuterol  continued, antibiotics for COPD exacerbation and steroids provided.  Discussed with ICU for admission.  Final diagnoses:  COPD exacerbation (HCC)  Hypoxia  ED Discharge Orders     None          Anthonette Lesage, Selinda, MD 10/18/24 0600  "

## 2024-10-18 NOTE — Progress Notes (Signed)
 PT was transported from [facility] to 24M-03 x2 RT's, X1 [facility]. VSS, no complications. Report given to 65m RT.

## 2024-10-18 NOTE — Progress Notes (Signed)
 RT NOTE:  Pt transported to 2M05 without event. Report given to [facility], RRT.

## 2024-10-18 NOTE — Progress Notes (Signed)
 eLink Physician-Brief Progress Note Patient Name: [name] [name] DOB: [birth year] MRN: [id number]   Date of Service  10/18/2024  HPI/Events of Note  Patient with elevated BP.  eICU Interventions  Home Losartan  and Hydrochlorothiazide  resumed. PRN iv Hydralazine  ordered.        Dontez Hauss U Cypress Hinkson 10/18/2024, 10:51 PM

## 2024-10-18 NOTE — Sepsis Progress Note (Signed)
 Following for sepsis monitoring ?

## 2024-10-19 ENCOUNTER — Telehealth: Payer: Self-pay | Admitting: Pulmonary Disease

## 2024-10-19 ENCOUNTER — Inpatient Hospital Stay (HOSPITAL_COMMUNITY)

## 2024-10-19 LAB — BASIC METABOLIC PANEL WITH GFR
Anion gap: 12 (ref 5–15)
BUN: 9 mg/dL (ref 8–23)
CO2: 24 mmol/L (ref 22–32)
Calcium: 9.4 mg/dL (ref 8.9–10.3)
Chloride: 99 mmol/L (ref 98–111)
Creatinine, Ser: 0.64 mg/dL (ref 0.61–1.24)
GFR, Estimated: >60 mL/min
Glucose, Bld: 120 mg/dL — ABNORMAL HIGH (ref 70–99)
Potassium: 4.1 mmol/L (ref 3.5–5.1)
Sodium: 135 mmol/L (ref 135–145)

## 2024-10-19 LAB — CBC
HCT: 40.4 % (ref 39.0–52.0)
Hemoglobin: 14.2 g/dL (ref 13.0–17.0)
MCH: 31.8 pg (ref 26.0–34.0)
MCHC: 35.1 g/dL (ref 30.0–36.0)
MCV: 90.4 fL (ref 80.0–100.0)
Platelets: 279 10*3/uL (ref 150–400)
RBC: 4.47 MIL/uL (ref 4.22–5.81)
RDW: 13.1 % (ref 11.5–15.5)
WBC: 10.4 10*3/uL (ref 4.0–10.5)
nRBC: 0 % (ref 0.0–0.2)

## 2024-10-19 LAB — GLUCOSE, CAPILLARY
Glucose-Capillary: 134 mg/dL — ABNORMAL HIGH (ref 70–99)
Glucose-Capillary: 183 mg/dL — ABNORMAL HIGH (ref 70–99)
Glucose-Capillary: 107 mg/dL — ABNORMAL HIGH (ref 70–99)

## 2024-10-19 LAB — PHOSPHORUS: Phosphorus: 3.2 mg/dL (ref 2.5–4.6)

## 2024-10-19 LAB — MAGNESIUM: Magnesium: 2.2 mg/dL (ref 1.7–2.4)

## 2024-10-19 MED ORDER — ALBUTEROL SULFATE (2.5 MG/3ML) 0.083% IN NEBU
2.5000 mg | INHALATION_SOLUTION | RESPIRATORY_TRACT | Status: AC | PRN
  Administered 2024-10-20 (×3): 2.5 mg via RESPIRATORY_TRACT
  Filled 2024-10-19 (×3): qty 3

## 2024-10-19 MED ORDER — DOXYCYCLINE HYCLATE 100 MG PO TABS
100.0000 mg | ORAL_TABLET | Freq: Two times a day (BID) | ORAL | Status: AC
  Administered 2024-10-19 – 2024-10-20 (×3): 100 mg via ORAL
  Filled 2024-10-19 (×3): qty 1

## 2024-10-19 MED ORDER — TERAZOSIN HCL 5 MG PO CAPS
5.0000 mg | ORAL_CAPSULE | Freq: Every day | ORAL | Status: AC
  Administered 2024-10-19 – 2024-10-20 (×2): 5 mg via ORAL
  Filled 2024-10-19 (×3): qty 1

## 2024-10-19 MED ORDER — BUDESONIDE 0.5 MG/2ML IN SUSP
0.5000 mg | Freq: Two times a day (BID) | RESPIRATORY_TRACT | Status: AC
  Administered 2024-10-19 – 2024-10-20 (×3): 0.5 mg via RESPIRATORY_TRACT
  Filled 2024-10-19 (×3): qty 2

## 2024-10-19 MED ORDER — ENOXAPARIN SODIUM 40 MG/0.4ML IJ SOSY
40.0000 mg | PREFILLED_SYRINGE | INTRAMUSCULAR | Status: AC
  Administered 2024-10-19 – 2024-10-20 (×2): 40 mg via SUBCUTANEOUS
  Filled 2024-10-19 (×2): qty 0.4

## 2024-10-19 MED ORDER — FINASTERIDE 5 MG PO TABS
5.0000 mg | ORAL_TABLET | Freq: Every day | ORAL | Status: AC
  Administered 2024-10-19 – 2024-10-20 (×2): 5 mg via ORAL
  Filled 2024-10-19 (×2): qty 1

## 2024-10-19 MED ORDER — METHYLPREDNISOLONE SODIUM SUCC 40 MG IJ SOLR
40.0000 mg | Freq: Two times a day (BID) | INTRAMUSCULAR | Status: AC
  Administered 2024-10-19 – 2024-10-20 (×3): 40 mg via INTRAVENOUS
  Filled 2024-10-19 (×3): qty 1

## 2024-10-19 NOTE — Plan of Care (Signed)

## 2024-10-19 NOTE — Telephone Encounter (Signed)
 Pt scheduled
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
## 2024-10-19 NOTE — Plan of Care (Signed)
" °  Problem: Education: Goal: Ability to describe self-care measures that may prevent or decrease complications (Diabetes Survival Skills Education) will improve Outcome: Progressing   Problem: Nutritional: Goal: Maintenance of adequate nutrition will improve Outcome: Progressing   Problem: Skin Integrity: Goal: Risk for impaired skin integrity will decrease Outcome: Progressing   Problem: Tissue Perfusion: Goal: Adequacy of tissue perfusion will improve Outcome: Progressing   Problem: Clinical Measurements: Goal: Ability to maintain clinical measurements within normal limits will improve Outcome: Progressing   Problem: Activity: Goal: Risk for activity intolerance will decrease Outcome: Progressing   Problem: Nutrition: Goal: Adequate nutrition will be maintained Outcome: Progressing   "

## 2024-10-19 NOTE — Hospital Course (Signed)
 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 Pt was taken off of vent yesterday and has been doing well. Has been receiving triple therapy, steroids, doxy for COPD exacerbation.

## 2024-10-19 NOTE — Telephone Encounter (Signed)
 Chronic tracheostomy followed by [facility] Used to [facility]  Needs hospital follow-up appointment with APP or MW at [facility] please

## 2024-10-20 LAB — CBC
HCT: 40.1 % (ref 39.0–52.0)
Hemoglobin: 14 g/dL (ref 13.0–17.0)
MCH: 31.4 pg (ref 26.0–34.0)
MCHC: 34.9 g/dL (ref 30.0–36.0)
MCV: 89.9 fL (ref 80.0–100.0)
Platelets: 270 10*3/uL (ref 150–400)
RBC: 4.46 MIL/uL (ref 4.22–5.81)
RDW: 13.2 % (ref 11.5–15.5)
WBC: 9.9 10*3/uL (ref 4.0–10.5)
nRBC: 0 % (ref 0.0–0.2)

## 2024-10-20 LAB — BASIC METABOLIC PANEL WITH GFR
Anion gap: 12 (ref 5–15)
BUN: 15 mg/dL (ref 8–23)
CO2: 24 mmol/L (ref 22–32)
Calcium: 9.1 mg/dL (ref 8.9–10.3)
Chloride: 96 mmol/L — ABNORMAL LOW (ref 98–111)
Creatinine, Ser: 0.71 mg/dL (ref 0.61–1.24)
GFR, Estimated: >60 mL/min
Glucose, Bld: 127 mg/dL — ABNORMAL HIGH (ref 70–99)
Potassium: 4.1 mmol/L (ref 3.5–5.1)
Sodium: 132 mmol/L — ABNORMAL LOW (ref 135–145)

## 2024-10-20 LAB — CULTURE, BLOOD (ROUTINE X 2)
Culture: NO GROWTH
Culture: NO GROWTH
Special Requests: ADEQUATE
Special Requests: ADEQUATE

## 2024-10-20 LAB — PHOSPHORUS: Phosphorus: 3.6 mg/dL (ref 2.5–4.6)

## 2024-10-20 LAB — MAGNESIUM: Magnesium: 2.3 mg/dL (ref 1.7–2.4)

## 2024-10-20 MED ORDER — HYDROXYZINE HCL 25 MG PO TABS: 25.0000 mg | ORAL_TABLET | Freq: Every day | ORAL | Status: AC | PRN

## 2024-10-20 MED ORDER — MONTELUKAST SODIUM 10 MG PO TABS
10.0000 mg | ORAL_TABLET | Freq: Every day | ORAL | Status: AC
  Administered 2024-10-20: 10 mg via ORAL
  Filled 2024-10-20: qty 1

## 2024-10-20 MED ORDER — ENSURE PLUS HIGH PROTEIN PO LIQD: 237.0000 mL | Freq: Two times a day (BID) | ORAL | Status: AC

## 2024-10-20 MED ORDER — PANTOPRAZOLE SODIUM 40 MG PO TBEC
40.0000 mg | DELAYED_RELEASE_TABLET | Freq: Every day | ORAL | Status: AC
  Administered 2024-10-20: 40 mg via ORAL
  Filled 2024-10-20: qty 1

## 2024-10-20 MED ORDER — GUAIFENESIN 100 MG/5ML PO LIQD
10.0000 mL | Freq: Four times a day (QID) | ORAL | Status: AC | PRN
  Administered 2024-10-20: 10 mL via ORAL
  Filled 2024-10-20: qty 15

## 2024-10-20 MED ORDER — ROSUVASTATIN CALCIUM 5 MG PO TABS
5.0000 mg | ORAL_TABLET | Freq: Every day | ORAL | Status: AC
  Administered 2024-10-20: 5 mg via ORAL
  Filled 2024-10-20: qty 1

## 2024-10-20 NOTE — Progress Notes (Signed)
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 Pt was taken off of vent yesterday and has been doing well. Has been receiving triple therapy, steroids, doxy for COPD exacerbation.   Assessment and Plan:  Acute hypoxic resp failure due to COPD exacerbation Chronic trach (baseline RA or ATC) s/p change to cuffed trach for ventilation on admission Patient presented on 7/12 with shortness of breath. Hypoxic on presentation. Required trach changed to cuffed trach and placed on ventilator. Patient was taken off ventilator on 2/5. Remained stable. Blood cultures negative RVP and COVID negative  -scheduled and prn nebs -continue Doxycycline   -steroids 40 mg q12 for now -discussed f/u with Pulmonologist OP     Chronic conditions: Hypertension Hyperlipidemia BPH Anxiety -Resumed home hydrochlorothiazide , losartan , finasteride , Hytrin . -Will resume home hydroxyzine , Singulair , Crestor         Subjective: Patient is doing better.  He still feels some congestion and complains of discomfort from the trach.  Cuffed trach is in place whereas he uses an uncuffed trach at home.  Patient requesting that it be changed.  Respiratory therapist contacted.  Physical Exam: BP 107/67 (BP Location: Right Arm)   Pulse 95   Temp 97.9 F (36.6 C)   Resp 20   Ht 5' 5 (1.651 m)   Wt 72.1 kg   SpO2 95%   BMI 26.45 kg/m    General: Alert, oriented X3  Eyes: Pupils equal, reactive  Oral cavity: moist mucous membranes  Head: Atraumatic, normocephalic  Neck: Trach + Chest: Bilateral rhonchi CVS: S1,S2 RRR. No murmurs  Abd: No distention, soft, non-tender. No masses palpable  Extr: No edema   MSK: No joint deformities or swelling   Neurological: Grossly intact.    Data Reviewed:    Latest Ref Rng & Units 10/20/2024    4:11 AM 10/19/2024    2:39 AM 10/18/2024    3:07 AM  CBC  WBC 4.0 - 10.5 K/uL 9.9  10.4  10.9   Hemoglobin 13.0 - 17.0 g/dL 85.9  85.7  85.3   Hematocrit 39.0 - 52.0 % 40.1  40.4  42.8   Platelets 150 - 400 K/uL 270  279  262       Latest Ref Rng & Units 10/20/2024    4:11 AM 10/19/2024    2:39 AM 10/18/2024    3:07 AM  BMP  Glucose 70 - 99 mg/dL 872  879  886   BUN 8 - 23 mg/dL 15  9  9    Creatinine 0.61 - 1.24 mg/dL 9.28  9.35  9.24   Sodium 135 - 145 mmol/L 132  135  135   Potassium 3.5 - 5.1 mmol/L 4.1  4.1  4.4   Chloride 98 - 111 mmol/L 96  99  101   CO2 22 - 32 mmol/L 24  24  22    Calcium  8.9 - 10.3 mg/dL 9.1  9.4  9.2      Family Communication: n/a  Disposition: Status is: Inpatient DVT PPx: Lovenox       Author: MDALA-GAUSI, Brynli Ollis AGATHA, MD 10/20/2024 1:22 PM  For on call review [email or web address].    "

## 2024-10-20 NOTE — Progress Notes (Signed)
 Patient complains that the deflated cuff on his trach is irritating his airway and requested family bring a cuffless trach from home to replace it with. RT explained that we carry cuffless trachs and can replace it for him. Patient declined and stated family would bring one from home tomorrow. Patient knows to notify RT so we can be at bedside while he completes the trach change himself. MD aware of plan.

## 2024-10-20 NOTE — Progress Notes (Addendum)
" ° °  NAME:  [name] [name], MRN:  [id number], DOB:  24-Nov-1950, LOS: 2 ADMISSION DATE:  10/18/2024, CONSULTATION DATE:  10/18/24  REFERRING MD:  EDP, CHIEF COMPLAINT:  acute hypoxic resp failure   History of Present Illness:  74 yo male presented with sob that started yesterday. Pt has chronic trach, uncuffed and on RA. Pt attempted suction at home without relief. He noted his saturations were 90% on RA at home, of note had trach change as outpt 3 days ago. He was given duoneb/mag/solumedrol bolus. Still required trach change to cuffed trach and placed on ventilator. Pt endorses cough and dyspnea that has progressed at home. Denied known sick contacts, no fevers, chills, no cp, n/v/d. Denies other symptoms.    At time of my exam pt was comfortable on vent and endorses feeling improved despite still diffuse wheeze.    Ccm asked to admit.   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 rate 20, height 5' 5 (1.651 m), weight 72.1 kg, SpO2 94%.    FiO2 (%):  [21 %] 21 %   Intake/Output Summary (Last 24 hours) at 10/20/2024 1757 Last data filed at 10/20/2024 0500 Gross per 24 hour  Intake --  Output 200 ml  Net -200 ml   Filed Weights   10/18/24 0416 10/19/24 0500 10/20/24 0500  Weight: 68 kg 72.5 kg 72.1 kg   Physical Exam: General: Chronically ill-appearing, no acute distress, ambulating in room HENT: [place], AT, OP clear, MMM Neck: Cuffed trach in place, c/d/i Eyes: EOMI, no scleral icterus Respiratory:  Clear to auscultation bilaterally.  No crackles, wheezing or rales Cardiovascular: RRR, -M/R/G, no JVD Extremities:-Edema,-tenderness Neuro: AAO x4, CNII-XII grossly intact Psych: Normal mood, normal affect  Imaging, labs and test noted above have been reviewed independently by me. WBC 9.9   Resolved problem list   Assessment and Plan  Acute hypoxic resp failure - resolved Chronic trach (baseline RA or ATC) s/p change to cuffed trach for ventilation on admission Aecopd -full rvp negative  -covid/flu/rsv negative  -Off vent >48 hours -Offered cuffless trach. Patient declined, preferring to await for family to bring home trach -Routine trach care -Pulmonary hygiene: scheduled and prn nebs -continue Doxycycline  for 5-7 days -Steroids x 5 days -Arranged f/u with Pulmonologist on 2/12   Pulmonary available as needed  Critical care time:     Care Time: 50 min  Slater Staff, M.D. [facility] Pulmonary/Critical Care Medicine 10/20/2024 5:57 PM   See Amion for personal pager For hours between 7 PM to 7 AM, please call Elink for urgent questions          "

## 2024-10-20 NOTE — Plan of Care (Signed)

## 2024-10-20 NOTE — Progress Notes (Signed)
 Nutrition Follow-up  DOCUMENTATION CODES:   Not applicable  INTERVENTION:  Ensure Plus High Protein po BID, each supplement provides 350 kcal and 20 grams of protein  NUTRITION DIAGNOSIS:   Inadequate oral intake related to inability to eat as evidenced by NPO status; ongoing, now on dysphagia 3 with thin liquids.  GOAL:   Patient will meet greater than or equal to 90% of their needs; progressing.  MONITOR:   Vent status, Labs, Weight trends, I & O's  REASON FOR ASSESSMENT:   Ventilator    ASSESSMENT:   74 y.o. male presented to the ED with shortness of breath and low saturations on room air via trach at home. PMH includes BPH, HTN, chronic trach, HLD, and anxiety. Pt admitted with acute hypoxic respiratory failure, placed on the vent upon admission.  Patient is currently on trach collar. He was taken off vent support 2/4. Cortrak was removed 2/4. He never received tube feeding. Diet was advanced to dysphagia 3 with thin liquids on 2/5. Meal intakes not recorded.   Patient states that he is eating well and he has no complaints. He usually drinks Ensure supplements twice a day; RD to order.   Labs reviewed.  Na 132 CBG: [id number]  Medications reviewed and include hydrochlorothiazide , solu-medrol , MVI with minerals, protonix .  Admit weight  68 kg Current weight  72.1 kg  Diet Order:   Diet Order             DIET DYS 3 Room service appropriate? Yes; Fluid consistency: Thin  Diet effective now                  EDUCATION NEEDS:   No education needs have been identified at this time  Skin:  Skin Assessment: Reviewed RN Assessment  Last BM:  2/5 type 5, type 6  Height:   Ht Readings from Last 1 Encounters:  10/18/24 5' 5 (1.651 m)    Weight:   Wt Readings from Last 1 Encounters:  10/20/24 72.1 kg    Ideal Body Weight:  61.8 kg  BMI:  Body mass index is 26.45 kg/m.  Estimated Nutritional Needs:   Kcal:  1700-1900  Protein:  80-100  grams  Fluid:  >/= 1.7 L   Suzen HUNT RD, LDN, CNSC Contact via secure chat. If unavailable, use group chat RD Inpatient.

## 2024-10-24 ENCOUNTER — Encounter

## 2024-10-26 ENCOUNTER — Inpatient Hospital Stay: Admitting: Internal Medicine
